# Patient Record
Sex: Female | Born: 1980 | Race: Black or African American | Hispanic: No | Marital: Single | Smoking: Current every day smoker
Health system: Southern US, Community
[De-identification: ages and names within clinical notes are randomized; demographics above are authoritative.]

## PROBLEM LIST (undated history)

## (undated) DIAGNOSIS — R768 Other specified abnormal immunological findings in serum: Secondary | ICD-10-CM

## (undated) DIAGNOSIS — M545 Low back pain, unspecified: Secondary | ICD-10-CM

## (undated) DIAGNOSIS — Z9119 Patient's noncompliance with other medical treatment and regimen: Secondary | ICD-10-CM

## (undated) DIAGNOSIS — F250 Schizoaffective disorder, bipolar type: Secondary | ICD-10-CM

## (undated) DIAGNOSIS — D649 Anemia, unspecified: Secondary | ICD-10-CM

## (undated) DIAGNOSIS — Z3490 Encounter for supervision of normal pregnancy, unspecified, unspecified trimester: Secondary | ICD-10-CM

## (undated) DIAGNOSIS — M3214 Glomerular disease in systemic lupus erythematosus: Principal | ICD-10-CM

## (undated) DIAGNOSIS — F319 Bipolar disorder, unspecified: Secondary | ICD-10-CM

## (undated) DIAGNOSIS — I309 Acute pericarditis, unspecified: Secondary | ICD-10-CM

## (undated) DIAGNOSIS — I10 Essential (primary) hypertension: Secondary | ICD-10-CM

## (undated) DIAGNOSIS — Z992 Dependence on renal dialysis: Secondary | ICD-10-CM

## (undated) DIAGNOSIS — F29 Unspecified psychosis not due to a substance or known physiological condition: Secondary | ICD-10-CM

## (undated) DIAGNOSIS — IMO0002 Reserved for concepts with insufficient information to code with codable children: Secondary | ICD-10-CM

## (undated) DIAGNOSIS — Z9289 Personal history of other medical treatment: Secondary | ICD-10-CM

## (undated) DIAGNOSIS — M329 Systemic lupus erythematosus, unspecified: Secondary | ICD-10-CM

## (undated) DIAGNOSIS — Z720 Tobacco use: Secondary | ICD-10-CM

## (undated) DIAGNOSIS — I509 Heart failure, unspecified: Secondary | ICD-10-CM

## (undated) DIAGNOSIS — Z7952 Long term (current) use of systemic steroids: Secondary | ICD-10-CM

## (undated) DIAGNOSIS — Z91199 Patient's noncompliance with other medical treatment and regimen due to unspecified reason: Secondary | ICD-10-CM

## (undated) DIAGNOSIS — N186 End stage renal disease: Secondary | ICD-10-CM

## (undated) DIAGNOSIS — F209 Schizophrenia, unspecified: Secondary | ICD-10-CM

## (undated) HISTORY — DX: Systemic lupus erythematosus, unspecified: M32.9

## (undated) HISTORY — DX: Reserved for concepts with insufficient information to code with codable children: IMO0002

## (undated) HISTORY — DX: Unspecified psychosis not due to a substance or known physiological condition: F29

## (undated) HISTORY — DX: Glomerular disease in systemic lupus erythematosus: M32.14

---
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
## 2001-06-12 ENCOUNTER — Emergency Department (HOSPITAL_COMMUNITY): Admission: EM | Admit: 2001-06-12 | Discharge: 2001-06-12 | Payer: Self-pay | Admitting: Emergency Medicine

## 2001-10-21 ENCOUNTER — Inpatient Hospital Stay (HOSPITAL_COMMUNITY): Admission: AD | Admit: 2001-10-21 | Discharge: 2001-10-21 | Payer: Self-pay | Admitting: *Deleted

## 2002-03-31 ENCOUNTER — Emergency Department (HOSPITAL_COMMUNITY): Admission: EM | Admit: 2002-03-31 | Discharge: 2002-03-31 | Payer: Self-pay | Admitting: Emergency Medicine

## 2002-04-17 ENCOUNTER — Emergency Department (HOSPITAL_COMMUNITY): Admission: EM | Admit: 2002-04-17 | Discharge: 2002-04-18 | Payer: Self-pay | Admitting: Emergency Medicine

## 2003-02-01 ENCOUNTER — Emergency Department (HOSPITAL_COMMUNITY): Admission: EM | Admit: 2003-02-01 | Discharge: 2003-02-01 | Payer: Self-pay | Admitting: Emergency Medicine

## 2003-02-12 ENCOUNTER — Emergency Department (HOSPITAL_COMMUNITY): Admission: EM | Admit: 2003-02-12 | Discharge: 2003-02-12 | Payer: Self-pay | Admitting: Emergency Medicine

## 2003-02-28 ENCOUNTER — Inpatient Hospital Stay (HOSPITAL_COMMUNITY): Admission: EM | Admit: 2003-02-28 | Discharge: 2003-03-05 | Payer: Self-pay

## 2003-04-08 HISTORY — PX: OTHER SURGICAL HISTORY: SHX169

## 2006-01-24 ENCOUNTER — Emergency Department (HOSPITAL_COMMUNITY): Admission: EM | Admit: 2006-01-24 | Discharge: 2006-01-24 | Payer: Self-pay | Admitting: Emergency Medicine

## 2006-05-13 ENCOUNTER — Emergency Department (HOSPITAL_COMMUNITY): Admission: EM | Admit: 2006-05-13 | Discharge: 2006-05-13 | Payer: Self-pay | Admitting: Emergency Medicine

## 2006-06-26 ENCOUNTER — Emergency Department (HOSPITAL_COMMUNITY): Admission: EM | Admit: 2006-06-26 | Discharge: 2006-06-27 | Payer: Self-pay | Admitting: Emergency Medicine

## 2006-09-09 ENCOUNTER — Emergency Department (HOSPITAL_COMMUNITY): Admission: EM | Admit: 2006-09-09 | Discharge: 2006-09-09 | Payer: Self-pay | Admitting: Emergency Medicine

## 2007-01-05 ENCOUNTER — Ambulatory Visit: Payer: Self-pay | Admitting: Family Medicine

## 2007-01-06 ENCOUNTER — Ambulatory Visit: Payer: Self-pay | Admitting: Obstetrics & Gynecology

## 2007-03-25 ENCOUNTER — Ambulatory Visit: Payer: Self-pay | Admitting: Obstetrics & Gynecology

## 2007-07-21 ENCOUNTER — Ambulatory Visit: Payer: Self-pay | Admitting: Gynecology

## 2007-08-01 ENCOUNTER — Emergency Department (HOSPITAL_COMMUNITY): Admission: EM | Admit: 2007-08-01 | Discharge: 2007-08-01 | Payer: Self-pay | Admitting: Emergency Medicine

## 2007-08-08 ENCOUNTER — Emergency Department (HOSPITAL_COMMUNITY): Admission: EM | Admit: 2007-08-08 | Discharge: 2007-08-08 | Payer: Self-pay | Admitting: Emergency Medicine

## 2007-12-28 ENCOUNTER — Emergency Department (HOSPITAL_COMMUNITY): Admission: EM | Admit: 2007-12-28 | Discharge: 2007-12-29 | Payer: Self-pay | Admitting: Emergency Medicine

## 2008-11-02 ENCOUNTER — Emergency Department (HOSPITAL_COMMUNITY): Admission: EM | Admit: 2008-11-02 | Discharge: 2008-11-02 | Payer: Self-pay | Admitting: Emergency Medicine

## 2009-01-02 ENCOUNTER — Emergency Department (HOSPITAL_COMMUNITY): Admission: EM | Admit: 2009-01-02 | Discharge: 2009-01-02 | Payer: Self-pay | Admitting: Emergency Medicine

## 2009-03-12 ENCOUNTER — Emergency Department (HOSPITAL_COMMUNITY): Admission: EM | Admit: 2009-03-12 | Discharge: 2009-03-12 | Payer: Self-pay | Admitting: Emergency Medicine

## 2009-03-26 ENCOUNTER — Emergency Department (HOSPITAL_COMMUNITY): Admission: EM | Admit: 2009-03-26 | Discharge: 2009-03-26 | Payer: Self-pay | Admitting: Emergency Medicine

## 2009-05-16 ENCOUNTER — Ambulatory Visit: Payer: Self-pay | Admitting: Obstetrics & Gynecology

## 2009-05-16 LAB — CONVERTED CEMR LAB
FSH: 127.2 milliintl units/mL — ABNORMAL HIGH
Prolactin: 8.6 ng/mL
TSH: 0.435 microintl units/mL (ref 0.350–4.500)

## 2009-07-09 ENCOUNTER — Emergency Department (HOSPITAL_COMMUNITY): Admission: EM | Admit: 2009-07-09 | Discharge: 2009-07-09 | Payer: Self-pay | Admitting: Emergency Medicine

## 2009-07-12 ENCOUNTER — Emergency Department (HOSPITAL_COMMUNITY): Admission: EM | Admit: 2009-07-12 | Discharge: 2009-07-12 | Payer: Self-pay | Admitting: Emergency Medicine

## 2009-08-20 ENCOUNTER — Emergency Department (HOSPITAL_COMMUNITY): Admission: EM | Admit: 2009-08-20 | Discharge: 2009-08-21 | Payer: Self-pay | Admitting: Emergency Medicine

## 2009-09-04 ENCOUNTER — Emergency Department (HOSPITAL_COMMUNITY): Admission: EM | Admit: 2009-09-04 | Discharge: 2009-09-04 | Payer: Self-pay | Admitting: Family Medicine

## 2009-09-12 ENCOUNTER — Ambulatory Visit: Payer: Self-pay | Admitting: Obstetrics and Gynecology

## 2009-10-18 ENCOUNTER — Emergency Department (HOSPITAL_COMMUNITY): Admission: EM | Admit: 2009-10-18 | Discharge: 2009-10-18 | Payer: Self-pay | Admitting: Emergency Medicine

## 2009-10-21 ENCOUNTER — Emergency Department (HOSPITAL_COMMUNITY): Admission: EM | Admit: 2009-10-21 | Discharge: 2009-10-21 | Payer: Self-pay | Admitting: Emergency Medicine

## 2009-11-20 ENCOUNTER — Emergency Department (HOSPITAL_COMMUNITY): Admission: EM | Admit: 2009-11-20 | Discharge: 2009-11-20 | Payer: Self-pay | Admitting: Emergency Medicine

## 2010-01-30 ENCOUNTER — Ambulatory Visit: Payer: Self-pay | Admitting: Obstetrics and Gynecology

## 2010-05-06 ENCOUNTER — Emergency Department (HOSPITAL_COMMUNITY)
Admission: EM | Admit: 2010-05-06 | Discharge: 2010-05-07 | Payer: Self-pay | Source: Home / Self Care | Admitting: Emergency Medicine

## 2010-05-08 ENCOUNTER — Emergency Department (HOSPITAL_COMMUNITY)
Admission: EM | Admit: 2010-05-08 | Discharge: 2010-05-08 | Disposition: A | Discharge status: Home / Self Care | Payer: Medicare Other | Attending: Emergency Medicine | Admitting: Emergency Medicine

## 2010-05-08 DIAGNOSIS — M542 Cervicalgia: Secondary | ICD-10-CM | POA: Insufficient documentation

## 2010-05-08 DIAGNOSIS — R51 Headache: Secondary | ICD-10-CM | POA: Insufficient documentation

## 2010-05-08 DIAGNOSIS — S139XXA Sprain of joints and ligaments of unspecified parts of neck, initial encounter: Secondary | ICD-10-CM | POA: Insufficient documentation

## 2010-05-08 DIAGNOSIS — R404 Transient alteration of awareness: Secondary | ICD-10-CM | POA: Insufficient documentation

## 2010-05-08 DIAGNOSIS — M549 Dorsalgia, unspecified: Secondary | ICD-10-CM | POA: Insufficient documentation

## 2010-06-12 ENCOUNTER — Ambulatory Visit: Payer: Medicare Other | Attending: Physical Medicine and Rehabilitation

## 2010-06-12 DIAGNOSIS — M542 Cervicalgia: Secondary | ICD-10-CM | POA: Insufficient documentation

## 2010-06-12 DIAGNOSIS — M545 Low back pain, unspecified: Secondary | ICD-10-CM | POA: Insufficient documentation

## 2010-06-12 DIAGNOSIS — R5381 Other malaise: Secondary | ICD-10-CM | POA: Insufficient documentation

## 2010-06-12 DIAGNOSIS — IMO0001 Reserved for inherently not codable concepts without codable children: Secondary | ICD-10-CM | POA: Insufficient documentation

## 2010-06-13 ENCOUNTER — Ambulatory Visit: Payer: Medicare Other

## 2010-06-19 ENCOUNTER — Ambulatory Visit: Payer: Medicare Other | Admitting: Physical Therapy

## 2010-06-21 ENCOUNTER — Ambulatory Visit: Payer: Medicare Other

## 2010-06-24 ENCOUNTER — Ambulatory Visit: Payer: Medicare Other | Admitting: Physical Therapy

## 2010-06-24 LAB — POCT URINALYSIS DIP (DEVICE)
Glucose, UA: NEGATIVE mg/dL
Hgb urine dipstick: NEGATIVE
Ketones, ur: NEGATIVE mg/dL
Nitrite: NEGATIVE
Protein, ur: 30 mg/dL — AB
Specific Gravity, Urine: >=1.03 (ref 1.005–1.030)
Urobilinogen, UA: 0.2 mg/dL (ref 0.0–1.0)
pH: 6 (ref 5.0–8.0)

## 2010-06-24 LAB — URINE CULTURE: Colony Count: >=100000

## 2010-06-24 LAB — DIFFERENTIAL
Basophils Absolute: 0 10*3/uL (ref 0.0–0.1)
Basophils Relative: 0 % (ref 0–1)
Eosinophils Absolute: 0 10*3/uL (ref 0.0–0.7)
Eosinophils Relative: 1 % (ref 0–5)
Lymphocytes Relative: 46 % (ref 12–46)
Lymphs Abs: 1.6 10*3/uL (ref 0.7–4.0)
Monocytes Absolute: 0.4 10*3/uL (ref 0.1–1.0)
Monocytes Relative: 13 % — ABNORMAL HIGH (ref 3–12)
Neutro Abs: 1.4 10*3/uL — ABNORMAL LOW (ref 1.7–7.7)
Neutrophils Relative %: 41 % — ABNORMAL LOW (ref 43–77)

## 2010-06-24 LAB — URINALYSIS, ROUTINE W REFLEX MICROSCOPIC
Bilirubin Urine: NEGATIVE
Glucose, UA: NEGATIVE mg/dL
Ketones, ur: 15 mg/dL — AB
Nitrite: POSITIVE — AB
Protein, ur: 30 mg/dL — AB
Specific Gravity, Urine: 1.031 — ABNORMAL HIGH (ref 1.005–1.030)
Urobilinogen, UA: 1 mg/dL (ref 0.0–1.0)
pH: 5.5 (ref 5.0–8.0)

## 2010-06-24 LAB — CBC
HCT: 36.6 % (ref 36.0–46.0)
Hemoglobin: 12.3 g/dL (ref 12.0–15.0)
MCHC: 33.6 g/dL (ref 30.0–36.0)
MCV: 88.9 fL (ref 78.0–100.0)
Platelets: 170 10*3/uL (ref 150–400)
RBC: 4.11 MIL/uL (ref 3.87–5.11)
RDW: 12.9 % (ref 11.5–15.5)
WBC: 3.5 10*3/uL — ABNORMAL LOW (ref 4.0–10.5)

## 2010-06-24 LAB — BASIC METABOLIC PANEL
BUN: 15 mg/dL (ref 6–23)
CO2: 27 mEq/L (ref 19–32)
Calcium: 9.8 mg/dL (ref 8.4–10.5)
Chloride: 106 mEq/L (ref 96–112)
Creatinine, Ser: 1.09 mg/dL (ref 0.4–1.2)
GFR calc Af Amer: >60 mL/min (ref >60)
GFR calc non Af Amer: 60 mL/min — ABNORMAL LOW (ref >60)
Glucose, Bld: 95 mg/dL (ref 70–99)
Potassium: 3.6 mEq/L (ref 3.5–5.1)
Sodium: 141 mEq/L (ref 135–145)

## 2010-06-24 LAB — HEPATIC FUNCTION PANEL
ALT: 26 U/L (ref 0–35)
AST: 25 U/L (ref 0–37)
Albumin: 4.2 g/dL (ref 3.5–5.2)
Alkaline Phosphatase: 68 U/L (ref 39–117)
Bilirubin, Direct: 0.1 mg/dL (ref 0.0–0.3)
Indirect Bilirubin: 0.6 mg/dL (ref 0.3–0.9)
Total Bilirubin: 0.7 mg/dL (ref 0.3–1.2)
Total Protein: 8.3 g/dL (ref 6.0–8.3)

## 2010-06-24 LAB — GC/CHLAMYDIA PROBE AMP, GENITAL
Chlamydia, DNA Probe: NEGATIVE
GC Probe Amp, Genital: NEGATIVE

## 2010-06-24 LAB — WET PREP, GENITAL
Trich, Wet Prep: NONE SEEN
WBC, Wet Prep HPF POC: NONE SEEN
Yeast Wet Prep HPF POC: NONE SEEN

## 2010-06-24 LAB — URINE MICROSCOPIC-ADD ON

## 2010-06-24 LAB — RPR: RPR Ser Ql: NONREACTIVE

## 2010-06-24 LAB — POCT PREGNANCY, URINE
Preg Test, Ur: NEGATIVE
Preg Test, Ur: NEGATIVE

## 2010-06-26 LAB — URINALYSIS, ROUTINE W REFLEX MICROSCOPIC
Bilirubin Urine: NEGATIVE
Glucose, UA: NEGATIVE mg/dL
Hgb urine dipstick: NEGATIVE
Ketones, ur: NEGATIVE mg/dL
Leukocytes, UA: NEGATIVE
Nitrite: NEGATIVE
Protein, ur: 30 mg/dL — AB
Specific Gravity, Urine: 1.03 (ref 1.005–1.030)
Urobilinogen, UA: 0.2 mg/dL (ref 0.0–1.0)
pH: 5 (ref 5.0–8.0)

## 2010-06-26 LAB — CBC
Platelets: 181 10*3/uL (ref 150–400)
RBC: 4.08 MIL/uL (ref 3.87–5.11)
WBC: 4 10*3/uL (ref 4.0–10.5)

## 2010-06-26 LAB — BASIC METABOLIC PANEL
BUN: 10 mg/dL (ref 6–23)
CO2: 28 mEq/L (ref 19–32)
Calcium: 9.4 mg/dL (ref 8.4–10.5)
Chloride: 108 mEq/L (ref 96–112)
Creatinine, Ser: 1 mg/dL (ref 0.4–1.2)
GFR calc Af Amer: >60 mL/min (ref >60)
GFR calc non Af Amer: >60 mL/min (ref >60)
Glucose, Bld: 82 mg/dL (ref 70–99)
Potassium: 3.8 mEq/L (ref 3.5–5.1)
Sodium: 141 mEq/L (ref 135–145)

## 2010-06-26 LAB — DIFFERENTIAL
Lymphocytes Relative: 38 % (ref 12–46)
Lymphs Abs: 1.5 10*3/uL (ref 0.7–4.0)
Neutrophils Relative %: 49 % (ref 43–77)

## 2010-06-26 LAB — RPR: RPR Ser Ql: NONREACTIVE

## 2010-06-26 LAB — URINE MICROSCOPIC-ADD ON

## 2010-06-26 LAB — GC/CHLAMYDIA PROBE AMP, GENITAL
Chlamydia, DNA Probe: NEGATIVE
GC Probe Amp, Genital: NEGATIVE

## 2010-06-26 LAB — POCT PREGNANCY, URINE: Preg Test, Ur: NEGATIVE

## 2010-06-26 LAB — WET PREP, GENITAL: Yeast Wet Prep HPF POC: NONE SEEN

## 2010-07-08 LAB — URINALYSIS, ROUTINE W REFLEX MICROSCOPIC
Glucose, UA: NEGATIVE mg/dL
Ketones, ur: NEGATIVE mg/dL
Nitrite: NEGATIVE
Protein, ur: NEGATIVE mg/dL

## 2010-07-08 LAB — WET PREP, GENITAL
Trich, Wet Prep: NONE SEEN
WBC, Wet Prep HPF POC: NONE SEEN
Yeast Wet Prep HPF POC: NONE SEEN

## 2010-07-12 LAB — URINE MICROSCOPIC-ADD ON

## 2010-07-12 LAB — POCT I-STAT, CHEM 8
Calcium, Ion: 1.22 mmol/L (ref 1.12–1.32)
Creatinine, Ser: 1.1 mg/dL (ref 0.4–1.2)
Glucose, Bld: 75 mg/dL (ref 70–99)
Hemoglobin: 13.3 g/dL (ref 12.0–15.0)
Sodium: 141 mEq/L (ref 135–145)
TCO2: 29 mmol/L (ref 0–100)

## 2010-07-12 LAB — URINALYSIS, ROUTINE W REFLEX MICROSCOPIC
Glucose, UA: NEGATIVE mg/dL
Hgb urine dipstick: NEGATIVE
Protein, ur: 30 mg/dL — AB

## 2010-08-20 NOTE — Group Therapy Note (Signed)
NAME:  Sara [name], Sara [name] NO.:  [id number]   MEDICAL RECORD NO.:  [id number]          PATIENT TYPE:  WOC   LOCATION:  [facility]                   FACILITY:  [facility]   PHYSICIAN:  Ginger Carne, MD DATE OF BIRTH:  [birth year]   DATE OF SERVICE:  07/21/2007                                  CLINIC NOTE   The patient returns today because of followup related to lack of menses  over the past 2 years.  She has been on Risperdal for approximately 3-  1/2 to 4 years.  Laboratory work revealed a normal TSH.  Her FSH was  120, and her LH was not performed.  Her urine pregnancy test was  negative in September 2008, and her prolactin level was 26.2.  The  patient had an Aygestin withdrawal bleed in early January following  medication prescribed on March 25, 2007, for a 10-day trial of  Aygestin 10 mg.  She complains of menopausal symptoms compatible with  menopause.  I think at this point it would be appropriate to consider  placing her on a q. 64-month course of Aygestin 10 mg daily for 10 days  every 3 months at the beginning of each month.  She declines the use of  oral contraceptives because she says she forgets to take it and does not  want to utilize Depo-Provera because of not being reassured that she  will have monthly menses.  At this point, I am concerned about her  amenorrhea, which possibly could be secondary to premature menopause.  I  would suspect the Risperdal would cause a lowered FSH, but certainly can  be responsible for galactorrhea.  I asked her to sit tight because she  is going to see her counselor about having her Risperdal dose lowered or  taken off.  I asked her to return afterwards to see about further  testing, including chromosome analyses.  After she has seen her  counselor and has had medication adjusted, I asked her to return 1 or 2  months after medication adjustment to see if she gets a menses on her  own.     ______________________________  Ginger Carne, MD     SHB/MEDQ  D:  07/21/2007  T:  07/21/2007  Job:  [id number]

## 2010-08-20 NOTE — Group Therapy Note (Signed)
NAME:  Sara [name], GAU NO.:  [id number]   MEDICAL RECORD NO.:  [id number]          PATIENT TYPE:  WOC   LOCATION:  [facility]                   FACILITY:  [facility]   PHYSICIAN:  Elsie Lincoln, MD      DATE OF BIRTH:  09-18-1980   DATE OF SERVICE:  01/06/2007                                  CLINIC NOTE   The patient is a 30 year old nulliparous female who was sent to me by  the health department for 6 months of amenorrhea.  Her major medical  problem is psychosis.  She is on risperidone.  She is nulliparous.  She  is complaining of hot flashes and headaches.   PAST MEDICAL HISTORY:  Psychosis.   PAST SURGICAL HISTORY:  None.   PAST GYN HISTORY:  Herpes.   MEDICATIONS:  Risperdal IM every 2 weeks.   OBSTETRICAL HISTORY:  Nulliparous.   PHYSICAL EXAM:  Positive galactorrhea.  ABDOMEN:  Positive stria, but the patient states these stretch marks  have been there for many years.  This started when she started  Risperdal.  Genitalia is slightly atrophic.  Cervix nulliparous years, nontender.  Adnexa no masses, nontender.   ASSESSMENT/PLAN:  A 30 year old female with galactorrhea.  1. Prolactin level pending.  2. Most likely secondary to Risperdal.  3. Provera withdrawal bleeding.  4. Most likely which is start on OCPs to protect her endometrium.  5. Come back in 3 weeks.           ______________________________  Elsie Lincoln, MD     KL/MEDQ  D:  01/06/2007  T:  01/07/2007  Job:  [id number]
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
## 2010-10-21 ENCOUNTER — Emergency Department (HOSPITAL_COMMUNITY)
Admission: EM | Admit: 2010-10-21 | Discharge: 2010-10-21 | Disposition: A | Discharge status: Home / Self Care | Payer: Medicare Other | Attending: Emergency Medicine | Admitting: Emergency Medicine

## 2010-12-31 LAB — COMPREHENSIVE METABOLIC PANEL
ALT: 19
AST: 27
Albumin: 3.9
CO2: 26
Chloride: 105
GFR calc Af Amer: >60
GFR calc non Af Amer: >60
Sodium: 138
Total Bilirubin: 0.7

## 2010-12-31 LAB — ETHANOL: Alcohol, Ethyl (B): <5

## 2010-12-31 LAB — DIFFERENTIAL
Basophils Absolute: 0
Eosinophils Absolute: 0
Eosinophils Relative: 0
Lymphs Abs: 1.3
Monocytes Absolute: 0.6

## 2010-12-31 LAB — CBC
RBC: 3.89
WBC: 7.1

## 2010-12-31 LAB — RAPID URINE DRUG SCREEN, HOSP PERFORMED
Amphetamines: NOT DETECTED
Tetrahydrocannabinol: POSITIVE — AB

## 2011-01-06 LAB — URINALYSIS, ROUTINE W REFLEX MICROSCOPIC
Bilirubin Urine: NEGATIVE
Hgb urine dipstick: NEGATIVE
Ketones, ur: NEGATIVE
Nitrite: NEGATIVE
pH: 6

## 2011-01-06 LAB — POCT PREGNANCY, URINE: Preg Test, Ur: NEGATIVE

## 2011-01-06 LAB — POCT I-STAT, CHEM 8
BUN: 10
Chloride: 105
HCT: 41
Potassium: 3.7

## 2011-01-06 LAB — URINE MICROSCOPIC-ADD ON

## 2011-01-08 DIAGNOSIS — A6000 Herpesviral infection of urogenital system, unspecified: Secondary | ICD-10-CM | POA: Insufficient documentation

## 2011-01-08 DIAGNOSIS — N912 Amenorrhea, unspecified: Secondary | ICD-10-CM | POA: Insufficient documentation

## 2011-01-08 DIAGNOSIS — N643 Galactorrhea not associated with childbirth: Secondary | ICD-10-CM | POA: Insufficient documentation

## 2011-01-09 ENCOUNTER — Other Ambulatory Visit (HOSPITAL_COMMUNITY)
Admission: RE | Admit: 2011-01-09 | Discharge: 2011-01-09 | Disposition: A | Discharge status: Home / Self Care | Payer: Medicare HMO | Source: Ambulatory Visit | Attending: Obstetrics and Gynecology | Admitting: Obstetrics and Gynecology

## 2011-01-09 ENCOUNTER — Encounter: Payer: Self-pay | Admitting: Obstetrics and Gynecology

## 2011-01-09 ENCOUNTER — Ambulatory Visit (INDEPENDENT_AMBULATORY_CARE_PROVIDER_SITE_OTHER): Payer: Medicare Other | Admitting: Obstetrics and Gynecology

## 2011-01-09 DIAGNOSIS — N909 Noninflammatory disorder of vulva and perineum, unspecified: Secondary | ICD-10-CM

## 2011-01-09 DIAGNOSIS — Z124 Encounter for screening for malignant neoplasm of cervix: Secondary | ICD-10-CM | POA: Insufficient documentation

## 2011-01-09 DIAGNOSIS — N898 Other specified noninflammatory disorders of vagina: Secondary | ICD-10-CM

## 2011-01-09 DIAGNOSIS — Z01419 Encounter for gynecological examination (general) (routine) without abnormal findings: Secondary | ICD-10-CM

## 2011-01-09 DIAGNOSIS — N899 Noninflammatory disorder of vagina, unspecified: Secondary | ICD-10-CM

## 2011-01-09 MED ORDER — MEDROXYPROGESTERONE ACETATE 10 MG PO TABS: 10.0000 mg | ORAL_TABLET | Freq: Every day | ORAL | Status: DC

## 2011-01-09 NOTE — Progress Notes (Signed)
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 In the past she had FSH levels as high as 127. Been amenorrheic but surprisingly enough withdraws with Provera. She would like another prescription for Provera I told her as long as she is having withdrawal bleeding is fine to continue. Once the bleeding stops she could should consider estrogen supplementation.  Examination external genitalia normal, BUS within normal limits vagina clean well rugated cervix clean nulliparous Pap smear was taken. Uterus anterior normal size shape consistency adnexa normal.  Patient was complaining of some irritation a she recently changed soaps would like to be checked for infection in any case therefore we will do a wet prep.

## 2011-01-09 NOTE — Progress Notes (Signed)
Needs refill of hormone pill

## 2011-01-10 LAB — WET PREP, GENITAL: Yeast Wet Prep HPF POC: NONE SEEN

## 2011-01-14 ENCOUNTER — Telehealth: Payer: Self-pay | Admitting: *Deleted

## 2011-01-14 MED ORDER — METRONIDAZOLE 500 MG PO TABS: 500.0000 mg | ORAL_TABLET | Freq: Two times a day (BID) | ORAL | Status: AC

## 2011-01-14 NOTE — Telephone Encounter (Signed)
Pt left message requesting test results from visit on 10/4.  I called pt after reviewing her wet prep and Pap results. I informed her that her Pap was WNL. Her wet prep showed trichomoniasis infection which we can treat with medication. She will need to have her partner get tested and treated accordingly before resuming sexual intercourse with him. Pt voiced understanding. Rx sent to pharmacy of choice.

## 2011-01-18 ENCOUNTER — Emergency Department (HOSPITAL_COMMUNITY)
Admission: EM | Admit: 2011-01-18 | Discharge: 2011-01-19 | Disposition: A | Discharge status: Home / Self Care | Payer: Medicare HMO | Attending: Emergency Medicine | Admitting: Emergency Medicine

## 2011-01-18 DIAGNOSIS — I498 Other specified cardiac arrhythmias: Secondary | ICD-10-CM | POA: Insufficient documentation

## 2011-01-18 DIAGNOSIS — Z046 Encounter for general psychiatric examination, requested by authority: Secondary | ICD-10-CM | POA: Insufficient documentation

## 2011-01-18 LAB — COMPREHENSIVE METABOLIC PANEL
AST: 23 U/L (ref 0–37)
Albumin: 2.7 g/dL — ABNORMAL LOW (ref 3.5–5.2)
Alkaline Phosphatase: 85 U/L (ref 39–117)
Chloride: 103 mEq/L (ref 96–112)
Creatinine, Ser: 0.87 mg/dL (ref 0.50–1.10)
Potassium: 3.5 mEq/L (ref 3.5–5.1)
Sodium: 138 mEq/L (ref 135–145)
Total Bilirubin: 0.4 mg/dL (ref 0.3–1.2)

## 2011-01-18 LAB — DIFFERENTIAL
Basophils Absolute: 0 10*3/uL (ref 0.0–0.1)
Basophils Relative: 0 % (ref 0–1)
Eosinophils Absolute: 0 10*3/uL (ref 0.0–0.7)
Eosinophils Relative: 0 % (ref 0–5)

## 2011-01-18 LAB — CBC
Platelets: 245 10*3/uL (ref 150–400)
RDW: 13.5 % (ref 11.5–15.5)
WBC: 5.7 10*3/uL (ref 4.0–10.5)

## 2011-01-19 LAB — RAPID URINE DRUG SCREEN, HOSP PERFORMED
Amphetamines: NOT DETECTED
Barbiturates: NOT DETECTED
Tetrahydrocannabinol: POSITIVE — AB

## 2011-01-19 LAB — URINE MICROSCOPIC-ADD ON

## 2011-01-19 LAB — URINALYSIS, ROUTINE W REFLEX MICROSCOPIC
Bilirubin Urine: NEGATIVE
Nitrite: NEGATIVE
Specific Gravity, Urine: 1.045 — ABNORMAL HIGH (ref 1.005–1.030)
pH: 6 (ref 5.0–8.0)

## 2011-01-19 LAB — POCT PREGNANCY, URINE: Preg Test, Ur: NEGATIVE

## 2011-01-20 ENCOUNTER — Emergency Department (HOSPITAL_COMMUNITY)
Admission: EM | Admit: 2011-01-20 | Discharge: 2011-01-20 | Disposition: A | Discharge status: Home / Self Care | Payer: Medicare HMO | Attending: Emergency Medicine | Admitting: Emergency Medicine

## 2011-01-20 DIAGNOSIS — Z91199 Patient's noncompliance with other medical treatment and regimen due to unspecified reason: Secondary | ICD-10-CM | POA: Insufficient documentation

## 2011-01-20 DIAGNOSIS — F319 Bipolar disorder, unspecified: Secondary | ICD-10-CM | POA: Insufficient documentation

## 2011-01-20 DIAGNOSIS — Z9119 Patient's noncompliance with other medical treatment and regimen: Secondary | ICD-10-CM | POA: Insufficient documentation

## 2011-01-20 DIAGNOSIS — IMO0002 Reserved for concepts with insufficient information to code with codable children: Secondary | ICD-10-CM | POA: Insufficient documentation

## 2011-01-20 LAB — RAPID URINE DRUG SCREEN, HOSP PERFORMED
Amphetamines: NOT DETECTED
Barbiturates: NOT DETECTED
Cocaine: NOT DETECTED
Tetrahydrocannabinol: POSITIVE — AB

## 2011-01-20 LAB — DIFFERENTIAL
Basophils Absolute: 0 10*3/uL (ref 0.0–0.1)
Basophils Relative: 0 % (ref 0–1)
Eosinophils Absolute: 0 10*3/uL (ref 0.0–0.7)
Neutrophils Relative %: 60 % (ref 43–77)

## 2011-01-20 LAB — COMPREHENSIVE METABOLIC PANEL
ALT: 14 U/L (ref 0–35)
AST: 24 U/L (ref 0–37)
Albumin: 2.8 g/dL — ABNORMAL LOW (ref 3.5–5.2)
CO2: 26 mEq/L (ref 19–32)
Chloride: 103 mEq/L (ref 96–112)
GFR calc non Af Amer: 86 mL/min — ABNORMAL LOW (ref >90)
Sodium: 140 mEq/L (ref 135–145)
Total Bilirubin: 0.4 mg/dL (ref 0.3–1.2)

## 2011-01-20 LAB — POCT PREGNANCY, URINE: Preg Test, Ur: NEGATIVE

## 2011-01-20 LAB — CBC
Platelets: 242 10*3/uL (ref 150–400)
RBC: 4.17 MIL/uL (ref 3.87–5.11)
WBC: 6.5 10*3/uL (ref 4.0–10.5)

## 2011-01-23 LAB — RAPID URINE DRUG SCREEN, HOSP PERFORMED
Amphetamines: NOT DETECTED
Cocaine: NOT DETECTED
Opiates: NOT DETECTED
Tetrahydrocannabinol: NOT DETECTED

## 2011-01-23 LAB — BASIC METABOLIC PANEL
BUN: 8
Calcium: 9.7
Creatinine, Ser: 1.15
GFR calc Af Amer: >60

## 2011-01-23 LAB — DIFFERENTIAL
Basophils Relative: 0
Lymphs Abs: 1.7
Monocytes Relative: 13 — ABNORMAL HIGH
Neutro Abs: 1.4 — ABNORMAL LOW
Neutrophils Relative %: 39 — ABNORMAL LOW

## 2011-01-23 LAB — URINALYSIS, ROUTINE W REFLEX MICROSCOPIC
Hgb urine dipstick: NEGATIVE
Protein, ur: 30 — AB
Urobilinogen, UA: 1

## 2011-01-23 LAB — CBC
Platelets: 206
RBC: 4.24
WBC: 3.6 — ABNORMAL LOW

## 2011-01-23 LAB — URINE MICROSCOPIC-ADD ON

## 2011-01-23 LAB — B-NATRIURETIC PEPTIDE (CONVERTED LAB): Pro B Natriuretic peptide (BNP): <30

## 2011-01-23 LAB — ETHANOL: Alcohol, Ethyl (B): <5

## 2011-06-06 ENCOUNTER — Ambulatory Visit (INDEPENDENT_AMBULATORY_CARE_PROVIDER_SITE_OTHER): Payer: Medicare Other | Admitting: Obstetrics & Gynecology

## 2011-06-06 MED ORDER — MEDROXYPROGESTERONE ACETATE 10 MG PO TABS: 10.0000 mg | ORAL_TABLET | Freq: Every day | ORAL | Status: DC

## 2011-06-06 NOTE — Progress Notes (Signed)
  Subjective:    Patient ID: Sara [name], female    DOB: [birth year], 31 y.o.   MRN: [id number]  HPI  Sara [name] left after she discovered that she didn't need a pap smear today.  Review of Systems     Objective:   Physical Exam        Assessment & Plan:

## 2011-06-25 ENCOUNTER — Other Ambulatory Visit: Payer: Medicare HMO

## 2012-05-12 ENCOUNTER — Emergency Department (HOSPITAL_COMMUNITY)
Admission: EM | Admit: 2012-05-12 | Discharge: 2012-05-13 | Disposition: A | Discharge status: Other Acute Inpatient Hospital | Payer: Medicare HMO | Attending: Emergency Medicine | Admitting: Emergency Medicine

## 2012-05-12 ENCOUNTER — Encounter (HOSPITAL_COMMUNITY): Payer: Self-pay | Admitting: Emergency Medicine

## 2012-05-12 DIAGNOSIS — D649 Anemia, unspecified: Secondary | ICD-10-CM | POA: Insufficient documentation

## 2012-05-12 DIAGNOSIS — R4689 Other symptoms and signs involving appearance and behavior: Secondary | ICD-10-CM

## 2012-05-12 DIAGNOSIS — F603 Borderline personality disorder: Secondary | ICD-10-CM | POA: Insufficient documentation

## 2012-05-12 DIAGNOSIS — F1210 Cannabis abuse, uncomplicated: Secondary | ICD-10-CM | POA: Insufficient documentation

## 2012-05-12 DIAGNOSIS — Z3202 Encounter for pregnancy test, result negative: Secondary | ICD-10-CM | POA: Insufficient documentation

## 2012-05-12 DIAGNOSIS — F17200 Nicotine dependence, unspecified, uncomplicated: Secondary | ICD-10-CM | POA: Insufficient documentation

## 2012-05-12 DIAGNOSIS — Z8659 Personal history of other mental and behavioral disorders: Secondary | ICD-10-CM | POA: Insufficient documentation

## 2012-05-12 DIAGNOSIS — Z79899 Other long term (current) drug therapy: Secondary | ICD-10-CM | POA: Insufficient documentation

## 2012-05-12 LAB — BASIC METABOLIC PANEL
BUN: 13 mg/dL (ref 6–23)
CO2: 22 mEq/L (ref 19–32)
Calcium: 8.8 mg/dL (ref 8.4–10.5)
Chloride: 105 mEq/L (ref 96–112)
Creatinine, Ser: 1.32 mg/dL — ABNORMAL HIGH (ref 0.50–1.10)
GFR calc Af Amer: 62 mL/min — ABNORMAL LOW (ref >90)
GFR calc non Af Amer: 53 mL/min — ABNORMAL LOW (ref >90)
Glucose, Bld: 89 mg/dL (ref 70–99)
Potassium: 3.2 mEq/L — ABNORMAL LOW (ref 3.5–5.1)
Sodium: 137 mEq/L (ref 135–145)

## 2012-05-12 LAB — URINE MICROSCOPIC-ADD ON

## 2012-05-12 LAB — RAPID URINE DRUG SCREEN, HOSP PERFORMED
Amphetamines: NOT DETECTED
Barbiturates: NOT DETECTED
Benzodiazepines: NOT DETECTED
Cocaine: NOT DETECTED
Opiates: NOT DETECTED
Tetrahydrocannabinol: POSITIVE — AB

## 2012-05-12 LAB — CBC
HCT: 25.1 % — ABNORMAL LOW (ref 36.0–46.0)
Hemoglobin: 8.4 g/dL — ABNORMAL LOW (ref 12.0–15.0)
MCH: 28 pg (ref 26.0–34.0)
MCHC: 33.5 g/dL (ref 30.0–36.0)
MCV: 83.7 fL (ref 78.0–100.0)
Platelets: 225 10*3/uL (ref 150–400)
RBC: 3 MIL/uL — ABNORMAL LOW (ref 3.87–5.11)
RDW: 13.8 % (ref 11.5–15.5)
WBC: 7.3 10*3/uL (ref 4.0–10.5)

## 2012-05-12 LAB — URINALYSIS, ROUTINE W REFLEX MICROSCOPIC
Glucose, UA: NEGATIVE mg/dL
Leukocytes, UA: NEGATIVE
Nitrite: NEGATIVE
Protein, ur: >300 mg/dL — AB
Specific Gravity, Urine: 1.046 — ABNORMAL HIGH (ref 1.005–1.030)
Urobilinogen, UA: 1 mg/dL (ref 0.0–1.0)
pH: 6 (ref 5.0–8.0)

## 2012-05-12 LAB — PREGNANCY, URINE: Preg Test, Ur: NEGATIVE

## 2012-05-12 MED ORDER — ZIPRASIDONE MESYLATE 20 MG IM SOLR
20.0000 mg | Freq: Once | INTRAMUSCULAR | Status: AC
  Administered 2012-05-12: 20 mg via INTRAMUSCULAR
  Filled 2012-05-12: qty 20

## 2012-05-12 MED ORDER — LORAZEPAM 2 MG/ML IJ SOLN
1.0000 mg | Freq: Once | INTRAMUSCULAR | Status: AC
  Administered 2012-05-12: 1 mg via INTRAMUSCULAR
  Filled 2012-05-12: qty 1

## 2012-05-12 NOTE — ED Notes (Signed)
[id number]<IO> Expected date:<BR> Expected time:<BR> Means of arrival:<BR> Comments:<BR> EMS from [facility]-agitated and hypertensive
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
## 2012-05-12 NOTE — ED Notes (Signed)
Pt arrived to ED room accompanied by [facility] officers, hands cuffed.
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
## 2012-05-12 NOTE — ED Notes (Signed)
Brought in by EMS from [facility] with c/o hypertension and "very agitated behavior"--- pt was observed constantly moving and talking, pt has flight of ideas and incomprehensible; arrived to ED room very agitated and talking loudly, pt cooperative.

## 2012-05-13 LAB — URINE CULTURE
Colony Count: NO GROWTH
Culture: NO GROWTH

## 2012-05-13 MED ORDER — ACETAMINOPHEN 325 MG PO TABS: 650.0000 mg | ORAL_TABLET | ORAL | Status: DC | PRN

## 2012-05-13 MED ORDER — LACTATED RINGERS IV BOLUS (SEPSIS): 1000.0000 mL | Freq: Once | INTRAVENOUS | Status: DC

## 2012-05-13 MED ORDER — BENZTROPINE MESYLATE 1 MG PO TABS
1.0000 mg | ORAL_TABLET | Freq: Every day | ORAL | Status: DC
  Administered 2012-05-13: 1 mg via ORAL
  Filled 2012-05-13: qty 1

## 2012-05-13 MED ORDER — IBUPROFEN 600 MG PO TABS
600.0000 mg | ORAL_TABLET | Freq: Three times a day (TID) | ORAL | Status: DC | PRN
  Administered 2012-05-13: 600 mg via ORAL
  Filled 2012-05-13: qty 1

## 2012-05-13 MED ORDER — ZIPRASIDONE HCL 20 MG PO CAPS
20.0000 mg | ORAL_CAPSULE | Freq: Two times a day (BID) | ORAL | Status: DC
  Administered 2012-05-13: 20 mg via ORAL
  Filled 2012-05-13: qty 1

## 2012-05-13 MED ORDER — POTASSIUM CHLORIDE CRYS ER 20 MEQ PO TBCR
40.0000 meq | EXTENDED_RELEASE_TABLET | Freq: Once | ORAL | Status: AC
  Administered 2012-05-13: 40 meq via ORAL
  Filled 2012-05-13: qty 2

## 2012-05-13 MED ORDER — MEDROXYPROGESTERONE ACETATE 10 MG PO TABS: 10.0000 mg | ORAL_TABLET | Freq: Every day | ORAL | Status: DC

## 2012-05-13 MED ORDER — BENZTROPINE MESYLATE 1 MG/ML IJ SOLN: 1.0000 mg | Freq: Every day | INTRAMUSCULAR | Status: DC

## 2012-05-13 MED ORDER — ONDANSETRON HCL 4 MG PO TABS: 4.0000 mg | ORAL_TABLET | Freq: Three times a day (TID) | ORAL | Status: DC | PRN

## 2012-05-13 MED ORDER — ZIPRASIDONE MESYLATE 20 MG IM SOLR: 20.0000 mg | Freq: Once | INTRAMUSCULAR | Status: DC

## 2012-05-13 MED ORDER — ALUM & MAG HYDROXIDE-SIMETH 200-200-20 MG/5ML PO SUSP: 30.0000 mL | ORAL | Status: DC | PRN

## 2012-05-13 MED ORDER — ZOLPIDEM TARTRATE 5 MG PO TABS: 5.0000 mg | ORAL_TABLET | Freq: Every evening | ORAL | Status: DC | PRN

## 2012-05-13 MED ORDER — RISPERIDONE 2 MG PO TABS
2.0000 mg | ORAL_TABLET | Freq: Every day | ORAL | Status: DC
  Administered 2012-05-13: 2 mg via ORAL
  Filled 2012-05-13: qty 1

## 2012-05-13 MED ORDER — LORAZEPAM 1 MG PO TABS: 1.0000 mg | ORAL_TABLET | Freq: Three times a day (TID) | ORAL | Status: DC | PRN

## 2012-05-13 NOTE — ED Provider Notes (Addendum)
Assuming care of patient this morning. Patient in the ED for agitation, sent by [facility]. She is IVC. Awaiting psych evaluation. Workup thus far is normal, and vitals are stable and WNL. Patient had no complains, no concerns from the nursing side. Will continue to monitor.  Derwood Kaplan, MD 05/13/12 3086  Derwood Kaplan, MD 05/13/12 [phone]

## 2012-05-13 NOTE — ED Notes (Signed)
Pt asked writer if she could go to the bathroom, Clinical research associate assisted pt with no problems, pt is cooperative, and understanding of what is needed to be done. Pt gait is steady. Writer provided a [place] sandwich and ginger ale for pt.

## 2012-05-13 NOTE — ED Notes (Signed)
Report received 

## 2012-05-13 NOTE — Progress Notes (Addendum)
Reviewed Chart, labs, Vitals and notes.  1.Recommend Medications be verified at [facility] ASAP.  Please note when last Consta injection was done. 2. Recommend using PO Risperdal or IM Risperdal for psychosis 2mg . 3. Discontinue Geodon. 4. Add Cogentin 1mg  for EPS please.  I spoke with Dr. Soledad Gerlach and he agreed with this plan.  Psych ED RN notified at 10:15am Lloyd Huger T. Darrio Bade [facility] 05/13/2012 10:17 AM

## 2012-05-13 NOTE — ED Notes (Signed)
Pt became more agitated, started yelling inappropriate words and moving violently while in bed.

## 2012-05-13 NOTE — ED Notes (Signed)
Encouraging po fluids

## 2012-05-13 NOTE — ED Notes (Signed)
Transportation called ° °

## 2012-05-13 NOTE — ED Notes (Signed)
Patient easily agitated-needs redirection at times-speech pressured/slurred, hard to understand-guarded-MD notified of patients potential for escalation-order for po geodon

## 2012-05-13 NOTE — ED Notes (Signed)
Report called to Windy Canny, RN at [facility]
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
## 2012-06-04 ENCOUNTER — Emergency Department (HOSPITAL_COMMUNITY): Payer: Medicare HMO

## 2012-06-04 ENCOUNTER — Encounter (HOSPITAL_COMMUNITY): Payer: Self-pay | Admitting: *Deleted

## 2012-06-04 ENCOUNTER — Emergency Department (HOSPITAL_COMMUNITY)
Admission: EM | Admit: 2012-06-04 | Discharge: 2012-06-04 | Disposition: A | Discharge status: Home / Self Care | Payer: Medicare HMO | Attending: Emergency Medicine | Admitting: Emergency Medicine

## 2012-06-04 DIAGNOSIS — F29 Unspecified psychosis not due to a substance or known physiological condition: Secondary | ICD-10-CM | POA: Insufficient documentation

## 2012-06-04 DIAGNOSIS — E8809 Other disorders of plasma-protein metabolism, not elsewhere classified: Secondary | ICD-10-CM | POA: Insufficient documentation

## 2012-06-04 DIAGNOSIS — F17200 Nicotine dependence, unspecified, uncomplicated: Secondary | ICD-10-CM | POA: Insufficient documentation

## 2012-06-04 DIAGNOSIS — R809 Proteinuria, unspecified: Secondary | ICD-10-CM | POA: Insufficient documentation

## 2012-06-04 DIAGNOSIS — D649 Anemia, unspecified: Secondary | ICD-10-CM | POA: Insufficient documentation

## 2012-06-04 DIAGNOSIS — Z79899 Other long term (current) drug therapy: Secondary | ICD-10-CM | POA: Insufficient documentation

## 2012-06-04 DIAGNOSIS — Z3202 Encounter for pregnancy test, result negative: Secondary | ICD-10-CM | POA: Insufficient documentation

## 2012-06-04 DIAGNOSIS — N390 Urinary tract infection, site not specified: Secondary | ICD-10-CM | POA: Insufficient documentation

## 2012-06-04 LAB — COMPREHENSIVE METABOLIC PANEL
ALT: 16 U/L (ref 0–35)
AST: 16 U/L (ref 0–37)
Albumin: 1.9 g/dL — ABNORMAL LOW (ref 3.5–5.2)
Alkaline Phosphatase: 73 U/L (ref 39–117)
Calcium: 8.9 mg/dL (ref 8.4–10.5)
Glucose, Bld: 97 mg/dL (ref 70–99)
Potassium: 4.1 mEq/L (ref 3.5–5.1)
Sodium: 142 mEq/L (ref 135–145)
Total Protein: 5.8 g/dL — ABNORMAL LOW (ref 6.0–8.3)

## 2012-06-04 LAB — URINALYSIS, ROUTINE W REFLEX MICROSCOPIC
Nitrite: NEGATIVE
Protein, ur: >300 mg/dL — AB
Specific Gravity, Urine: 1.018 (ref 1.005–1.030)
Urobilinogen, UA: 0.2 mg/dL (ref 0.0–1.0)

## 2012-06-04 LAB — CBC
HCT: 23.3 % — ABNORMAL LOW (ref 36.0–46.0)
Hemoglobin: 7.6 g/dL — ABNORMAL LOW (ref 12.0–15.0)
RDW: 14.8 % (ref 11.5–15.5)
WBC: 5 10*3/uL (ref 4.0–10.5)

## 2012-06-04 LAB — URINE MICROSCOPIC-ADD ON

## 2012-06-04 MED ORDER — SULFAMETHOXAZOLE-TRIMETHOPRIM 800-160 MG PO TABS: 1.0000 | ORAL_TABLET | Freq: Two times a day (BID) | ORAL | Status: DC

## 2012-06-04 MED ORDER — T.E.D. BELOW KNEE/L X-LGTH MISC: 2.0000 [IU] | Freq: Every day | Status: DC

## 2012-06-04 MED ORDER — FUROSEMIDE 10 MG/ML IJ SOLN
20.0000 mg | Freq: Once | INTRAMUSCULAR | Status: AC
  Administered 2012-06-04: 20 mg via INTRAVENOUS
  Filled 2012-06-04: qty 2

## 2012-06-04 MED ORDER — FUROSEMIDE 20 MG PO TABS: ORAL_TABLET | ORAL | Status: DC
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
## 2012-06-04 NOTE — ED Notes (Signed)
Results of POCT pregnancy:   Negative 

## 2012-06-04 NOTE — ED Notes (Signed)
Pt reports ongoing swelling to bilateral lower legs and face. Pt states that she has been placed on "2 different fluid pills" with no relief. Pt states that she believes she is allergic to some of her medications. NAD noted.

## 2012-06-09 NOTE — ED Provider Notes (Signed)
Medical screening examination/treatment/procedure(s) were performed by non-physician practitioner and as supervising physician I was immediately available for consultation/collaboration.  Gilda Crease, MD 06/09/12 [phone]

## 2012-06-20 ENCOUNTER — Emergency Department (HOSPITAL_COMMUNITY)
Admission: EM | Admit: 2012-06-20 | Discharge: 2012-06-22 | Disposition: A | Discharge status: Home / Self Care | Payer: Medicare HMO | Attending: Emergency Medicine | Admitting: Emergency Medicine

## 2012-06-20 ENCOUNTER — Encounter (HOSPITAL_COMMUNITY): Payer: Self-pay | Admitting: Emergency Medicine

## 2012-06-20 DIAGNOSIS — I10 Essential (primary) hypertension: Secondary | ICD-10-CM | POA: Insufficient documentation

## 2012-06-20 DIAGNOSIS — IMO0002 Reserved for concepts with insufficient information to code with codable children: Secondary | ICD-10-CM | POA: Insufficient documentation

## 2012-06-20 DIAGNOSIS — F1210 Cannabis abuse, uncomplicated: Secondary | ICD-10-CM | POA: Insufficient documentation

## 2012-06-20 DIAGNOSIS — Z79899 Other long term (current) drug therapy: Secondary | ICD-10-CM | POA: Insufficient documentation

## 2012-06-20 DIAGNOSIS — F17200 Nicotine dependence, unspecified, uncomplicated: Secondary | ICD-10-CM | POA: Insufficient documentation

## 2012-06-20 DIAGNOSIS — Z3202 Encounter for pregnancy test, result negative: Secondary | ICD-10-CM | POA: Insufficient documentation

## 2012-06-20 DIAGNOSIS — F603 Borderline personality disorder: Secondary | ICD-10-CM | POA: Insufficient documentation

## 2012-06-20 DIAGNOSIS — R4789 Other speech disturbances: Secondary | ICD-10-CM | POA: Insufficient documentation

## 2012-06-20 DIAGNOSIS — F29 Unspecified psychosis not due to a substance or known physiological condition: Secondary | ICD-10-CM | POA: Insufficient documentation

## 2012-06-20 DIAGNOSIS — F259 Schizoaffective disorder, unspecified: Secondary | ICD-10-CM | POA: Insufficient documentation

## 2012-06-20 DIAGNOSIS — F1410 Cocaine abuse, uncomplicated: Secondary | ICD-10-CM | POA: Insufficient documentation

## 2012-06-20 DIAGNOSIS — F1010 Alcohol abuse, uncomplicated: Secondary | ICD-10-CM | POA: Insufficient documentation

## 2012-06-20 DIAGNOSIS — M7989 Other specified soft tissue disorders: Secondary | ICD-10-CM | POA: Insufficient documentation

## 2012-06-20 DIAGNOSIS — F250 Schizoaffective disorder, bipolar type: Secondary | ICD-10-CM

## 2012-06-20 HISTORY — DX: Essential (primary) hypertension: I10

## 2012-06-20 LAB — CBC
Hemoglobin: 8.8 g/dL — ABNORMAL LOW (ref 12.0–15.0)
MCH: 27.4 pg (ref 26.0–34.0)
MCV: 87.9 fL (ref 78.0–100.0)
Platelets: 232 10*3/uL (ref 150–400)
RBC: 3.21 MIL/uL — ABNORMAL LOW (ref 3.87–5.11)
WBC: 5.6 10*3/uL (ref 4.0–10.5)

## 2012-06-20 LAB — COMPREHENSIVE METABOLIC PANEL
ALT: 9 U/L (ref 0–35)
AST: 15 U/L (ref 0–37)
CO2: 23 mEq/L (ref 19–32)
Chloride: 108 mEq/L (ref 96–112)
GFR calc Af Amer: 46 mL/min — ABNORMAL LOW (ref >90)
GFR calc non Af Amer: 39 mL/min — ABNORMAL LOW (ref >90)
Glucose, Bld: 96 mg/dL (ref 70–99)
Sodium: 142 mEq/L (ref 135–145)
Total Bilirubin: 0.2 mg/dL — ABNORMAL LOW (ref 0.3–1.2)

## 2012-06-20 LAB — POCT PREGNANCY, URINE: Preg Test, Ur: NEGATIVE

## 2012-06-20 LAB — RAPID URINE DRUG SCREEN, HOSP PERFORMED
Amphetamines: NOT DETECTED
Barbiturates: NOT DETECTED
Tetrahydrocannabinol: POSITIVE — AB

## 2012-06-20 MED ORDER — OLANZAPINE 5 MG PO TABS
20.0000 mg | ORAL_TABLET | Freq: Two times a day (BID) | ORAL | Status: DC
  Administered 2012-06-20 – 2012-06-22 (×4): 20 mg via ORAL
  Filled 2012-06-20 (×4): qty 4

## 2012-06-20 MED ORDER — BENZTROPINE MESYLATE 1 MG PO TABS
1.0000 mg | ORAL_TABLET | Freq: Two times a day (BID) | ORAL | Status: DC
  Administered 2012-06-20 – 2012-06-22 (×4): 1 mg via ORAL
  Filled 2012-06-20 (×4): qty 1

## 2012-06-20 MED ORDER — LORAZEPAM 1 MG PO TABS: 1.0000 mg | ORAL_TABLET | Freq: Three times a day (TID) | ORAL | Status: DC | PRN

## 2012-06-20 MED ORDER — NICOTINE 21 MG/24HR TD PT24
21.0000 mg | MEDICATED_PATCH | Freq: Every day | TRANSDERMAL | Status: DC
  Filled 2012-06-20: qty 1

## 2012-06-20 MED ORDER — ONDANSETRON HCL 4 MG PO TABS: 4.0000 mg | ORAL_TABLET | Freq: Three times a day (TID) | ORAL | Status: DC | PRN

## 2012-06-20 MED ORDER — IBUPROFEN 600 MG PO TABS
600.0000 mg | ORAL_TABLET | Freq: Three times a day (TID) | ORAL | Status: DC | PRN
  Administered 2012-06-22: 600 mg via ORAL
  Filled 2012-06-20 (×2): qty 1

## 2012-06-20 MED ORDER — ACETAMINOPHEN 325 MG PO TABS: 650.0000 mg | ORAL_TABLET | ORAL | Status: DC | PRN

## 2012-06-20 MED ORDER — ZIPRASIDONE MESYLATE 20 MG IM SOLR
20.0000 mg | Freq: Once | INTRAMUSCULAR | Status: AC
  Administered 2012-06-20: 20 mg via INTRAMUSCULAR
  Filled 2012-06-20: qty 20

## 2012-06-20 MED ORDER — FUROSEMIDE 20 MG PO TABS: 20.0000 mg | ORAL_TABLET | Freq: Every day | ORAL | Status: DC

## 2012-06-20 MED ORDER — OXCARBAZEPINE 300 MG PO TABS
300.0000 mg | ORAL_TABLET | Freq: Three times a day (TID) | ORAL | Status: DC
  Administered 2012-06-20 – 2012-06-22 (×6): 300 mg via ORAL
  Filled 2012-06-20 (×8): qty 1
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
## 2012-06-21 ENCOUNTER — Telehealth (HOSPITAL_COMMUNITY): Payer: Self-pay | Admitting: Behavioral Health

## 2012-06-21 MED ORDER — ZIPRASIDONE MESYLATE 20 MG IM SOLR
10.0000 mg | Freq: Once | INTRAMUSCULAR | Status: AC
  Administered 2012-06-21: 10 mg via INTRAMUSCULAR
  Filled 2012-06-21 (×2): qty 20

## 2012-06-21 NOTE — ED Notes (Signed)
Patient is resting comfortably. 
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
## 2012-06-21 NOTE — ED Notes (Signed)
Acting out yelling at staff, calling this nurse a bitch for asking her to calm down, and get off phone, walking around hallways yelling obscenities and cursing, informed her she would be receiving injection ifshecouldnot calm down.

## 2012-06-21 NOTE — ED Notes (Signed)
2 bags of pt belongings placed in locker 37

## 2012-06-21 NOTE — ED Provider Notes (Signed)
Pt stable awaiting placement  Chenita Ruda L Amirrah Quigley, MD 06/21/12 0719 
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
## 2012-06-21 NOTE — Treatment Plan (Signed)
Since there are no 400 hall beds at [facility] and already two pt's accepted pending 400 hall beds, ACT should continue to seek placement elsewhere for Sara [name].  If outside placement can not be secured, pt will be considered at [facility] once a bed is available.

## 2012-06-21 NOTE — BHH Counselor (Signed)
ACT Counselor contact [facility] and spoke to [place].  ACT counselor was instructed to send another assessment notification form and include the ACT counselor name that completed the Assessment on 06-21-2012.

## 2012-06-21 NOTE — ED Notes (Signed)
Came up to NS asking for 1600 med, informed her she was asleep and it was this nurses opinion she needed the sleep from earlier geodon injection, stated "we will be in the front office in the morning" for not giving meds as you should be doing.became  Very agitated, asked to go back toher room until she calmed down, calmed down and asked tousephone, escalated on phone, nomore phone usage today d/t behaviors; after talkingon thephone around 1730 went back to room and bed. Sleeping.

## 2012-06-21 NOTE — ED Notes (Signed)
Pt belligerent and aggressive. She is non compliant with staff. She has a h/o Bipolar. Pt brought by [employer] for verbal threats.  Per [place], RN / Malva Cogan Hydrographic surveyor)

## 2012-06-21 NOTE — ED Notes (Signed)
Talking toMD

## 2012-06-22 NOTE — BHH Counselor (Signed)
Patient discharged and escorted out by nurse prior to ACT being able to meet with patient and providing outpatient referrals.

## 2012-06-22 NOTE — ED Notes (Signed)
Pt belongings returned to pt including items that were locked in safe by security

## 2012-06-22 NOTE — BHH Counselor (Signed)
3/18  Pt referred to [facility], [place], [place], [place]. Pending review.   3/18 No beds at [facility], [facility], [facility], [address], [place], [place], [place], [place].  3/17 Pt referred to [facility]; declined due to previous acuity. [facility] was recommended.

## 2012-06-22 NOTE — BHH Suicide Risk Assessment (Signed)
Suicide Risk Assessment  Discharge Assessment     Demographic Factors:  Adolescent or young adult, Low socioeconomic status and Unemployed  Mental Status Per Nursing Assessment::   On Admission:     Current Mental Status by Physician: NA  Loss Factors: Financial problems/change in socioeconomic status  Historical Factors: Impulsivity and NA  Risk Reduction Factors:   Sense of responsibility to family, Religious beliefs about death, Living with another person, especially a relative, Positive social support and Positive therapeutic relationship  Continued Clinical Symptoms:  Bipolar Disorder:   Mixed State Alcohol/Substance Abuse/Dependencies  Cognitive Features That Contribute To Risk:  Closed-mindedness Polarized thinking    Suicide Risk:  Minimal: No identifiable suicidal ideation.  Patients presenting with no risk factors but with morbid ruminations; may be classified as minimal risk based on the severity of the depressive symptoms  Discharge Diagnoses:   AXIS I:  Bipolar, mixed AXIS II:  Deferred AXIS III:   Past Medical History  Diagnosis Date  . Psychosis   . Hypertension    AXIS IV:  economic problems, other psychosocial or environmental problems, problems related to social environment and problems with access to health care services AXIS V:  41-50 serious symptoms  Plan Of Care/Follow-up recommendations:  Activity:  as tolerated Diet:  regular  Is patient on multiple antipsychotic therapies at discharge:  No   Has Patient had three or more failed trials of antipsychotic monotherapy by history:  No  Recommended Plan for Multiple Antipsychotic Therapies: Not applicable  Sara [name],JANARDHAHA R. 06/22/2012, 4:26 PM
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
## 2012-06-24 ENCOUNTER — Encounter: Payer: Medicare HMO | Admitting: Obstetrics & Gynecology

## 2012-07-02 NOTE — ED Provider Notes (Signed)
Medical screening examination/treatment/procedure(s) were performed by non-physician practitioner and as supervising physician I was immediately available for consultation/collaboration.  Toy Baker, MD 07/02/12 [phone]

## 2012-07-16 ENCOUNTER — Ambulatory Visit (INDEPENDENT_AMBULATORY_CARE_PROVIDER_SITE_OTHER): Payer: Medicare Other | Admitting: Obstetrics and Gynecology

## 2012-07-16 VITALS — BP 154/103 | HR 80 | Temp 98.5°F | Ht 68.0 in | Wt 243.0 lb

## 2012-07-16 DIAGNOSIS — N760 Acute vaginitis: Secondary | ICD-10-CM | POA: Insufficient documentation

## 2012-07-16 LAB — WET PREP, GENITAL
Trich, Wet Prep: NONE SEEN
WBC, Wet Prep HPF POC: NONE SEEN
Yeast Wet Prep HPF POC: NONE SEEN

## 2012-07-16 NOTE — Progress Notes (Signed)
Patient ID: Sara [name], female   DOB: [birth year], 32 y.o.   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 Patient was discharged and diagnosed with a UTI and proteinuria. Patient presents today with a complaint of bilateral pedal edema. She denies vaginal bleeding or pelvic pain. She reports her last pap smear was in December and she had a cervical procedure done a that time while incarcerated. She is not sure of the nature of the procedure but she states that the results were normal. She complains of a non pruritic, non malodorous vaginal discharge.  GENERAL: Well-developed, well-nourished female in no acute distress.  ABDOMEN: Soft, nontender, nondistended. No organomegaly. PELVIC: Normal external female genitalia. Vagina is pink and rugated.  Normal discharge. Normal appearing cervix. Uterus is normal in size. No adnexal mass or tenderness. EXTREMITIES: No cyanosis, clubbing, + 2  Bilateral pedal edema, 2+ distal pulses.  A/P 32 yo here for vaginitis and ED follow up in pedal edema - Wet prep collected - Patient will be contacted with any abnormal results - Patient will be referred to [facility] for further evaluation of pedal edema and further medical care

## 2012-07-19 ENCOUNTER — Telehealth: Payer: Self-pay

## 2012-07-19 MED ORDER — METRONIDAZOLE 500 MG PO TABS: 500.0000 mg | ORAL_TABLET | Freq: Two times a day (BID) | ORAL | Status: DC

## 2012-07-19 NOTE — Telephone Encounter (Signed)
Called pt and informed pt of BV and that an antibiotic has been sent to her pharmacy.  Pt stated understanding and did not have any further questions.

## 2012-07-19 NOTE — Telephone Encounter (Signed)
Message copied by Faythe Casa on Mon Jul 19, 2012  4:18 PM ------      Message from: CONSTANT, PEGGY      Created: Mon Jul 19, 2012  3:13 PM       Please inform patient of positive BV. Rx e-prescribed            Peggy ------

## 2012-07-19 NOTE — Addendum Note (Signed)
Addended by: Catalina Antigua on: 07/19/2012 03:14 PM   Modules accepted: Orders

## 2012-07-30 ENCOUNTER — Ambulatory Visit (INDEPENDENT_AMBULATORY_CARE_PROVIDER_SITE_OTHER): Payer: Medicaid Other | Admitting: Family Medicine

## 2012-07-30 VITALS — BP 152/100 | HR 84 | Ht 67.0 in | Wt 233.0 lb

## 2012-07-30 DIAGNOSIS — R809 Proteinuria, unspecified: Secondary | ICD-10-CM

## 2012-07-30 DIAGNOSIS — I10 Essential (primary) hypertension: Secondary | ICD-10-CM

## 2012-07-30 LAB — COMPREHENSIVE METABOLIC PANEL
Alkaline Phosphatase: 61 U/L (ref 39–117)
Creat: 4.37 mg/dL — ABNORMAL HIGH (ref 0.50–1.10)
Glucose, Bld: 75 mg/dL (ref 70–99)
Sodium: 139 mEq/L (ref 135–145)
Total Bilirubin: 0.3 mg/dL (ref 0.3–1.2)
Total Protein: 6.2 g/dL (ref 6.0–8.3)

## 2012-07-30 MED ORDER — LISINOPRIL 20 MG PO TABS: 20.0000 mg | ORAL_TABLET | Freq: Every day | ORAL | Status: DC

## 2012-07-30 NOTE — Patient Instructions (Signed)
Nice to meet you today. We will get some labs today to check on your kidney function. We will also ask that you collect some urine for 24 hours and return it to [place]. I sent in a prescription for lisinopril which is for your blood pressure and to help your kidneys.

## 2012-08-02 ENCOUNTER — Telehealth: Payer: Self-pay | Admitting: Family Medicine

## 2012-08-02 ENCOUNTER — Encounter: Payer: Self-pay | Admitting: Family Medicine

## 2012-08-02 NOTE — Assessment & Plan Note (Signed)
Patient with recently elevated BP. Never been on medication. Given changes in Cr and proteinuria, suspect this may be related to her kidney. Plan: will start on lisinopril 20 mg daily for HTN and renal protection

## 2012-08-02 NOTE — Telephone Encounter (Signed)
Patient's lab report reviewed with Dr Birdie Sons. Creatinine level jumped up from 1.6 one month ago to 4. Per last note she was asymptomatic. I suggested to Dr Birdie Sons to check in with her to ensure she is not developing any major renal related symptom. I reviewed her med list,she is on the following med with potential for renal impairment; Bactrim DS: Uncertain when last she took this, Dr Birdie Sons will confirm.  Lasix: To confirm if she is currently on this. Ibuprofen. She was recently started on Lisinopril for her BP at last visit. Damage to her Kidney might also be from her uncontrolled BP.  Assessment: Cockcroft-Gault GFR ;33.18mL/min                       Creatinine Clearance Cockcroft-Gault:23.5 mL/min  PMD advised to;                          Hold off on Lasix for now,advised avoidance of NSAID and Bactrim DS.                          Encourage good hydration.                          Recheck kidney function this week.                          Schedule renal referral as soon as possible for assessment and management.                          If no improvement in Kidney function might need kidney replacement therapy/dialysis.

## 2012-08-02 NOTE — Assessment & Plan Note (Signed)
Patient with recently noted proteinuria >300, elevation in Cr to 1.69, hypoalbuminemia, and LE edema likely indicating nephrotic syndrome. Unknown cause at this time. Was previously on lithium and this is known to cause renal dysfunction. No longer on lasix and infrequently uses NSAIDs. Denies any rash and joint pains that could indicate lupus. Father on dialysis, potentially heritable renal dysfunction. Plan: will obtain CMET to determine current renal function, 24 hour urine protein to evaluate for nephrotic syndrome. Will see in f/u in one week to review labs and clinical decision making based on results.

## 2012-08-02 NOTE — Telephone Encounter (Signed)
Patient seen as new patient on 07/30/12 and noted to have proteinuria, hypoalbuminemia, elevated creatinine, and LE edema all concerning for renal insult. CMET was obtained that revealed Cr 4.37 from 1.69 one month ago. Albumin was stable. Electrolytes were stable as well.  Spoke to patient on the phone to inform of these lab results. Confirmed that she is not taking lasix as the ED only gave her 5 doses. Is infrequently taking ibuprofen with only a couple of doses in the past several weeks. Is not currently on bactrim. She denied any complaints other than continued edema. No current nausea, vomiting, or loss of appetite, though noted a couple of weeks ago was having some nausea and decreased appetite and this resolved last week.  Advised patient to continue with her 24 hour urine collection. She has an appointment this Friday for f/u of these issues. Will need to obtain f/u Cr at that time. Patient needs referral for nephrologist, though at this time we are not the medical practice on the patients medicaid card so can not put referral in until we are on her card. Spoke with Terese Door, who stated it could be as early as May 1st that card gets changed, but more likely will be June 1st as form was submitted after the 15th of the month. Advised to return to care if she develops nausea, vomiting, loss of appetite, or mental status changes.

## 2012-08-02 NOTE — Progress Notes (Signed)
  Subjective:    Patient ID: Sara [name], female    DOB: Aug 05, 1980, 32 y.o.   MRN: [id number]  Hypertension Pertinent negatives include no chest pain, headaches or shortness of breath.   Patient is a 32 yo female who presents for f/u of LE edema and HTN. Additionally noted to have proteinuria, hypoalbuminemia, and elevated Cr on labs from a month ago.  Renal: patient noted to have Cr 1.69 one month ago. 1.22 a month prior to that. Also with proteinuria >300. Noted that feet began swelling in February 2014. Seen in ED and given lasix, with 5 doses for home. Didn't help much and has not taken any more. Occasionally takes ibuprofen or advil, though states has not taken more than a couple doses in past several weeks. Has been wrapping legs. Was on lithium for 2 years and recently was taken off of this in February. Denies any rash or joint aches.  HTN: has recently been noted to be high. Is not on any medications. Denies chest pain, shortness of breath, and HA.  FHx: father on dialysis, unsure of reason for this.  Review of Systems  Constitutional: Negative for fever and unexpected weight change.  HENT: Negative for hearing loss, trouble swallowing and tinnitus.   Eyes: Negative for visual disturbance.  Respiratory: Negative for cough and shortness of breath.   Cardiovascular: Positive for leg swelling. Negative for chest pain.  Gastrointestinal: Negative for nausea, vomiting, abdominal pain and diarrhea.  Endocrine: Positive for polydipsia.  Genitourinary: Positive for vaginal discharge (protein in urine). Negative for dysuria, frequency and difficulty urinating.  Skin: Negative for rash.  Neurological: Negative for dizziness, weakness, light-headedness and headaches.  Psychiatric/Behavioral: The patient is not nervous/anxious.       Objective:   Physical Exam  Constitutional: She appears well-developed and well-nourished.  HENT:  Head: Normocephalic and atraumatic.  Eyes:  Conjunctivae are normal.  Neck: Normal range of motion.  Cardiovascular: Normal rate, regular rhythm and normal heart sounds.  Exam reveals no gallop and no friction rub.   No murmur heard. Pulmonary/Chest: Effort normal and breath sounds normal. She has no wheezes. She has no rales.  Abdominal: Soft. Bowel sounds are normal. She exhibits no distension and no mass. There is no tenderness. There is no rebound and no guarding.  Musculoskeletal: She exhibits edema (bilateral LE 1+ pitting edema).  Neurological: She is alert.  Skin: Skin is warm and dry. No rash noted.  BP 152/100  Pulse 84  Ht 5\' 7"  (1.702 m)  Wt 233 lb (105.688 kg)  BMI 36.48 kg/m2    Assessment & Plan:

## 2012-08-06 ENCOUNTER — Ambulatory Visit: Payer: Medicare HMO | Admitting: Family Medicine

## 2012-08-08 ENCOUNTER — Inpatient Hospital Stay (HOSPITAL_COMMUNITY)
Admission: EM | Admit: 2012-08-08 | Discharge: 2012-08-23 | DRG: 546 | Disposition: A | Discharge status: Home / Self Care | Payer: Medicare HMO | Attending: Family Medicine | Admitting: Family Medicine

## 2012-08-08 ENCOUNTER — Encounter (HOSPITAL_COMMUNITY): Payer: Self-pay | Admitting: *Deleted

## 2012-08-08 DIAGNOSIS — M329 Systemic lupus erythematosus, unspecified: Principal | ICD-10-CM | POA: Diagnosis present

## 2012-08-08 DIAGNOSIS — F29 Unspecified psychosis not due to a substance or known physiological condition: Secondary | ICD-10-CM | POA: Diagnosis present

## 2012-08-08 DIAGNOSIS — Z9119 Patient's noncompliance with other medical treatment and regimen: Secondary | ICD-10-CM

## 2012-08-08 DIAGNOSIS — M3214 Glomerular disease in systemic lupus erythematosus: Secondary | ICD-10-CM | POA: Diagnosis present

## 2012-08-08 DIAGNOSIS — F309 Manic episode, unspecified: Secondary | ICD-10-CM

## 2012-08-08 DIAGNOSIS — F3110 Bipolar disorder, current episode manic without psychotic features, unspecified: Secondary | ICD-10-CM

## 2012-08-08 DIAGNOSIS — R809 Proteinuria, unspecified: Secondary | ICD-10-CM

## 2012-08-08 DIAGNOSIS — A6000 Herpesviral infection of urogenital system, unspecified: Secondary | ICD-10-CM

## 2012-08-08 DIAGNOSIS — N179 Acute kidney failure, unspecified: Secondary | ICD-10-CM | POA: Diagnosis present

## 2012-08-08 DIAGNOSIS — N912 Amenorrhea, unspecified: Secondary | ICD-10-CM

## 2012-08-08 DIAGNOSIS — F17200 Nicotine dependence, unspecified, uncomplicated: Secondary | ICD-10-CM | POA: Diagnosis present

## 2012-08-08 DIAGNOSIS — R7989 Other specified abnormal findings of blood chemistry: Secondary | ICD-10-CM

## 2012-08-08 DIAGNOSIS — N760 Acute vaginitis: Secondary | ICD-10-CM

## 2012-08-08 DIAGNOSIS — Z9114 Patient's other noncompliance with medication regimen: Secondary | ICD-10-CM

## 2012-08-08 DIAGNOSIS — R768 Other specified abnormal immunological findings in serum: Secondary | ICD-10-CM | POA: Diagnosis present

## 2012-08-08 DIAGNOSIS — Z79899 Other long term (current) drug therapy: Secondary | ICD-10-CM

## 2012-08-08 DIAGNOSIS — I10 Essential (primary) hypertension: Secondary | ICD-10-CM | POA: Diagnosis present

## 2012-08-08 DIAGNOSIS — N058 Unspecified nephritic syndrome with other morphologic changes: Secondary | ICD-10-CM | POA: Diagnosis present

## 2012-08-08 DIAGNOSIS — D638 Anemia in other chronic diseases classified elsewhere: Secondary | ICD-10-CM | POA: Diagnosis present

## 2012-08-08 DIAGNOSIS — N2581 Secondary hyperparathyroidism of renal origin: Secondary | ICD-10-CM | POA: Diagnosis present

## 2012-08-08 LAB — CBC WITH DIFFERENTIAL/PLATELET
Eosinophils Absolute: 0 10*3/uL (ref 0.0–0.7)
Eosinophils Relative: 0 % (ref 0–5)
HCT: 22.4 % — ABNORMAL LOW (ref 36.0–46.0)
Hemoglobin: 7.4 g/dL — ABNORMAL LOW (ref 12.0–15.0)
Lymphocytes Relative: 40 % (ref 12–46)
Lymphs Abs: 2.2 10*3/uL (ref 0.7–4.0)
MCH: 27.5 pg (ref 26.0–34.0)
MCV: 83.3 fL (ref 78.0–100.0)
Monocytes Absolute: 0.3 10*3/uL (ref 0.1–1.0)
Monocytes Relative: 6 % (ref 3–12)
Platelets: 233 10*3/uL (ref 150–400)
RBC: 2.69 MIL/uL — ABNORMAL LOW (ref 3.87–5.11)

## 2012-08-08 LAB — BASIC METABOLIC PANEL
BUN: 37 mg/dL — ABNORMAL HIGH (ref 6–23)
CO2: 21 mEq/L (ref 19–32)
Calcium: 8.9 mg/dL (ref 8.4–10.5)
Creatinine, Ser: 6.92 mg/dL — ABNORMAL HIGH (ref 0.50–1.10)
GFR calc non Af Amer: 7 mL/min — ABNORMAL LOW (ref >90)
Glucose, Bld: 90 mg/dL (ref 70–99)

## 2012-08-08 LAB — ETHANOL: Alcohol, Ethyl (B): <11 mg/dL (ref 0–11)

## 2012-08-08 MED ORDER — ALUM & MAG HYDROXIDE-SIMETH 200-200-20 MG/5ML PO SUSP
30.0000 mL | ORAL | Status: DC | PRN
  Filled 2012-08-08: qty 30

## 2012-08-08 MED ORDER — ZIPRASIDONE MESYLATE 20 MG IM SOLR
20.0000 mg | Freq: Once | INTRAMUSCULAR | Status: AC
  Administered 2012-08-08: 20 mg via INTRAMUSCULAR

## 2012-08-08 MED ORDER — ACETAMINOPHEN 325 MG PO TABS: 650.0000 mg | ORAL_TABLET | ORAL | Status: DC | PRN

## 2012-08-08 MED ORDER — SODIUM CHLORIDE 0.9 % IV SOLN
1000.0000 mL | Freq: Once | INTRAVENOUS | Status: AC
  Administered 2012-08-08: 1000 mL via INTRAVENOUS

## 2012-08-08 MED ORDER — SODIUM CHLORIDE 0.9 % IV SOLN: 1000.0000 mL | INTRAVENOUS | Status: DC

## 2012-08-08 MED ORDER — NICOTINE 21 MG/24HR TD PT24
21.0000 mg | MEDICATED_PATCH | Freq: Every day | TRANSDERMAL | Status: DC
  Filled 2012-08-08 (×15): qty 1

## 2012-08-08 MED ORDER — ZIPRASIDONE MESYLATE 20 MG IM SOLR
INTRAMUSCULAR | Status: AC
  Filled 2012-08-08: qty 20

## 2012-08-08 MED ORDER — LORAZEPAM 1 MG PO TABS: 1.0000 mg | ORAL_TABLET | Freq: Three times a day (TID) | ORAL | Status: DC | PRN

## 2012-08-08 MED ORDER — ONDANSETRON HCL 4 MG PO TABS: 4.0000 mg | ORAL_TABLET | Freq: Three times a day (TID) | ORAL | Status: DC | PRN

## 2012-08-08 MED ORDER — IBUPROFEN 600 MG PO TABS
600.0000 mg | ORAL_TABLET | Freq: Three times a day (TID) | ORAL | Status: DC | PRN
  Filled 2012-08-08: qty 1

## 2012-08-08 MED ORDER — LORAZEPAM 2 MG/ML IJ SOLN
2.0000 mg | Freq: Once | INTRAMUSCULAR | Status: AC
  Administered 2012-08-08: 2 mg via INTRAMUSCULAR

## 2012-08-08 MED ORDER — LORAZEPAM 2 MG/ML IJ SOLN
INTRAMUSCULAR | Status: AC
  Filled 2012-08-08: qty 1

## 2012-08-08 NOTE — ED Notes (Addendum)
Pt sleeping soundly, resp even and unlabored. Pt was able to transfer to the bed w/o difficulty per security. Side rails up x2

## 2012-08-08 NOTE — ED Provider Notes (Signed)
Medical screening examination/treatment/procedure(s) were conducted as a shared visit with non-physician practitioner(s) and myself.  I personally evaluated the patient during the encounter  Please see my other note for complete details  Lyanne Co, MD 08/08/12 2306

## 2012-08-08 NOTE — ED Notes (Signed)
Dr Patria Mane aware of lab results-transfer pt back to ED for further eval.  Charge nurse aware nad will  Call w/ bed number.

## 2012-08-08 NOTE — ED Notes (Signed)
[id number]<AV> Expected date:<BR> Expected time:<BR> Means of arrival:<BR> Comments:<BR> For psych transfer - Bed 40

## 2012-08-08 NOTE — ED Provider Notes (Signed)
Medical screening examination/treatment/procedure(s) were performed by non-physician practitioner and as supervising physician I was immediately available for consultation/collaboration.  Quandarius Nill L Demonte Dobratz, MD 08/08/12 1640 

## 2012-08-08 NOTE — ED Notes (Signed)
Spoke with Tammy at [facility] to arrange transportation to [facility]. Informed by Tammy that she would return call to obtain patient information.
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
## 2012-08-08 NOTE — ED Provider Notes (Signed)
The patient was in the psychiatric emergency department awaiting placement for psychiatric disposition given her presentation what appears to be psychosis.  I noted that her BUN and creatinine were significantly more elevated than they have before in the past.  Is concerning for acute renal failure that is worsening in a young 32 year old.  Her BUN today is 37 and her creatinine is 6.92.  Her potassium is 3.5.  One week ago her BUN was 19 and her creatinine was 4.37.  One month ago her BUN was 15 and her creatinine was 1.69.  The patient will be admitted to the family practice service who is currently addressing this as an outpatient however this will need to be addressed as an inpatient before the patient is medically cleared for psychiatric placement. Urinalysis pending at this time.  Results for orders placed during the hospital encounter of 08/08/12  CBC WITH DIFFERENTIAL      Result Value Range   WBC 5.4  4.0 - 10.5 K/uL   RBC 2.69 (*) 3.87 - 5.11 MIL/uL   Hemoglobin 7.4 (*) 12.0 - 15.0 g/dL   HCT 16.1 (*) 09.6 - 04.5 %   MCV 83.3  78.0 - 100.0 fL   MCH 27.5  26.0 - 34.0 pg   MCHC 33.0  30.0 - 36.0 g/dL   RDW 40.9  81.1 - 91.4 %   Platelets 233  150 - 400 K/uL   Neutrophils Relative 54  43 - 77 %   Neutro Abs 2.9  1.7 - 7.7 K/uL   Lymphocytes Relative 40  12 - 46 %   Lymphs Abs 2.2  0.7 - 4.0 K/uL   Monocytes Relative 6  3 - 12 %   Monocytes Absolute 0.3  0.1 - 1.0 K/uL   Eosinophils Relative 0  0 - 5 %   Eosinophils Absolute 0.0  0.0 - 0.7 K/uL   Basophils Relative 0  0 - 1 %   Basophils Absolute 0.0  0.0 - 0.1 K/uL  BASIC METABOLIC PANEL      Result Value Range   Sodium 143  135 - 145 mEq/L   Potassium 3.5  3.5 - 5.1 mEq/L   Chloride 109  96 - 112 mEq/L   CO2 21  19 - 32 mEq/L   Glucose, Bld 90  70 - 99 mg/dL   BUN 37 (*) 6 - 23 mg/dL   Creatinine, Ser 7.82 (*) 0.50 - 1.10 mg/dL   Calcium 8.9  8.4 - 95.6 mg/dL   GFR calc non Af Amer 7 (*) >90 mL/min   GFR calc Af Amer 8 (*)  >90 mL/min  ACETAMINOPHEN LEVEL      Result Value Range   Acetaminophen (Tylenol), Serum <15.0  10 - 30 ug/mL  SALICYLATE LEVEL      Result Value Range   Salicylate Lvl <2.0 (*) 2.8 - 20.0 mg/dL  ETHANOL      Result Value Range   Alcohol, Ethyl (B) <11  0 - 11 mg/dL     Lyanne Co, MD 08/08/12 2131
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
## 2012-08-08 NOTE — ED Notes (Signed)
Patient given a cordless phone to call her mother. Patient tearful after talking to mother.

## 2012-08-08 NOTE — ED Notes (Signed)
Patient changed into blue scrubs with help of [employer]. Security in to wand patient and search one pt belonging bag.

## 2012-08-08 NOTE — ED Notes (Signed)
Patient to ED Acute Bed 15 from Psych ED. Patient ambulated to the bathroom; gait steady. Patient states that she had a bowel movement and did not urinate. Patient states that she did not receive a phone call today and that she would like to call her mother.

## 2012-08-08 NOTE — ED Notes (Signed)
Attempt to call report to [phone] unsuccessful; receiving RN will return call to ED when available for report.
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
## 2012-08-08 NOTE — ED Notes (Signed)
Explained to patient that she was being transported to [facility]. Patient states that she did not want to go and that "I want to go home." States that she has an appointment with a kidney doctor of Wednesday and "I don't want to mess that up." Patient refused to sign transfer paperwork. Patient currently under IVC. Transfer paperwork signed by 2 RNs.

## 2012-08-09 ENCOUNTER — Inpatient Hospital Stay (HOSPITAL_COMMUNITY): Payer: Medicare HMO

## 2012-08-09 DIAGNOSIS — N179 Acute kidney failure, unspecified: Secondary | ICD-10-CM

## 2012-08-09 DIAGNOSIS — F29 Unspecified psychosis not due to a substance or known physiological condition: Secondary | ICD-10-CM

## 2012-08-09 LAB — CBC
HCT: 23.3 % — ABNORMAL LOW (ref 36.0–46.0)
Hemoglobin: 7.5 g/dL — ABNORMAL LOW (ref 12.0–15.0)
MCV: 82.9 fL (ref 78.0–100.0)
RDW: 13.8 % (ref 11.5–15.5)
WBC: 6.7 10*3/uL (ref 4.0–10.5)

## 2012-08-09 LAB — COMPREHENSIVE METABOLIC PANEL
Albumin: 2.5 g/dL — ABNORMAL LOW (ref 3.5–5.2)
Alkaline Phosphatase: 55 U/L (ref 39–117)
BUN: 37 mg/dL — ABNORMAL HIGH (ref 6–23)
Calcium: 8.7 mg/dL (ref 8.4–10.5)
GFR calc Af Amer: 9 mL/min — ABNORMAL LOW (ref >90)
Glucose, Bld: 70 mg/dL (ref 70–99)
Potassium: 3.8 mEq/L (ref 3.5–5.1)
Sodium: 140 mEq/L (ref 135–145)
Total Protein: 6.6 g/dL (ref 6.0–8.3)

## 2012-08-09 LAB — URINALYSIS, ROUTINE W REFLEX MICROSCOPIC
Bilirubin Urine: NEGATIVE
Glucose, UA: NEGATIVE mg/dL
Ketones, ur: 15 mg/dL — AB
Nitrite: NEGATIVE
Specific Gravity, Urine: 1.015 (ref 1.005–1.030)
pH: 5 (ref 5.0–8.0)

## 2012-08-09 LAB — PROTEIN / CREATININE RATIO, URINE
Protein Creatinine Ratio: 4.55 — ABNORMAL HIGH (ref 0.00–0.15)
Total Protein, Urine: 628.5 mg/dL

## 2012-08-09 LAB — URINE MICROSCOPIC-ADD ON

## 2012-08-09 LAB — IRON AND TIBC
Iron: 42 ug/dL (ref 42–135)
Saturation Ratios: 25 % (ref 20–55)
UIBC: 129 ug/dL (ref 125–400)

## 2012-08-09 LAB — RAPID URINE DRUG SCREEN, HOSP PERFORMED
Amphetamines: NOT DETECTED
Barbiturates: NOT DETECTED
Benzodiazepines: NOT DETECTED
Cocaine: NOT DETECTED

## 2012-08-09 LAB — PREGNANCY, URINE: Preg Test, Ur: NEGATIVE

## 2012-08-09 LAB — RETICULOCYTES: RBC.: 2.59 MIL/uL — ABNORMAL LOW (ref 3.87–5.11)

## 2012-08-09 LAB — VITAMIN B12: Vitamin B-12: 834 pg/mL (ref 211–911)

## 2012-08-09 LAB — FOLATE: Folate: 11 ng/mL

## 2012-08-09 MED ORDER — DARBEPOETIN ALFA-POLYSORBATE 100 MCG/0.5ML IJ SOLN
100.0000 ug | INTRAMUSCULAR | Status: DC
  Administered 2012-08-16 – 2012-08-23 (×2): 100 ug via SUBCUTANEOUS
  Filled 2012-08-09 (×5): qty 0.5

## 2012-08-09 MED ORDER — HALOPERIDOL 2 MG PO TABS
2.0000 mg | ORAL_TABLET | Freq: Four times a day (QID) | ORAL | Status: DC | PRN
  Filled 2012-08-09: qty 1

## 2012-08-09 MED ORDER — HYDRALAZINE HCL 20 MG/ML IJ SOLN: 10.0000 mg | Freq: Four times a day (QID) | INTRAMUSCULAR | Status: DC | PRN

## 2012-08-09 MED ORDER — ONDANSETRON HCL 4 MG/2ML IJ SOLN: 4.0000 mg | Freq: Four times a day (QID) | INTRAMUSCULAR | Status: DC | PRN

## 2012-08-09 MED ORDER — SODIUM CHLORIDE 0.9 % IV SOLN: INTRAVENOUS | Status: DC

## 2012-08-09 MED ORDER — SODIUM CHLORIDE 0.9 % IV SOLN
INTRAVENOUS | Status: DC
  Administered 2012-08-09: 04:00:00 via INTRAVENOUS

## 2012-08-09 MED ORDER — ONDANSETRON HCL 4 MG PO TABS: 4.0000 mg | ORAL_TABLET | Freq: Four times a day (QID) | ORAL | Status: DC | PRN

## 2012-08-09 MED ORDER — HALOPERIDOL LACTATE 5 MG/ML IJ SOLN
2.0000 mg | Freq: Four times a day (QID) | INTRAMUSCULAR | Status: DC | PRN
  Filled 2012-08-09: qty 1

## 2012-08-09 MED ORDER — ZIPRASIDONE MESYLATE 20 MG IM SOLR
40.0000 mg | Freq: Once | INTRAMUSCULAR | Status: AC | PRN
  Filled 2012-08-09: qty 40

## 2012-08-09 MED ORDER — POLYETHYLENE GLYCOL 3350 17 G PO PACK
17.0000 g | PACK | Freq: Every day | ORAL | Status: DC | PRN
  Filled 2012-08-09: qty 1

## 2012-08-09 MED ORDER — HEPARIN SODIUM (PORCINE) 5000 UNIT/ML IJ SOLN
5000.0000 [IU] | Freq: Three times a day (TID) | INTRAMUSCULAR | Status: DC
  Filled 2012-08-09 (×45): qty 1

## 2012-08-09 MED ORDER — HALOPERIDOL LACTATE 5 MG/ML IJ SOLN: 2.0000 mg | Freq: Four times a day (QID) | INTRAMUSCULAR | Status: DC | PRN

## 2012-08-09 MED ORDER — LORAZEPAM 2 MG/ML IJ SOLN: 1.0000 mg | INTRAMUSCULAR | Status: DC | PRN

## 2012-08-09 MED ORDER — ZIPRASIDONE MESYLATE 20 MG IM SOLR
40.0000 mg | INTRAMUSCULAR | Status: AC
  Filled 2012-08-09: qty 40

## 2012-08-09 MED ORDER — LORAZEPAM 1 MG PO TABS: 2.0000 mg | ORAL_TABLET | ORAL | Status: DC | PRN

## 2012-08-09 MED ORDER — ACETAMINOPHEN 650 MG RE SUPP: 650.0000 mg | Freq: Four times a day (QID) | RECTAL | Status: DC | PRN

## 2012-08-09 MED ORDER — HALOPERIDOL LACTATE 5 MG/ML IJ SOLN
5.0000 mg | Freq: Four times a day (QID) | INTRAMUSCULAR | Status: DC | PRN
  Filled 2012-08-09: qty 1

## 2012-08-09 MED ORDER — LORAZEPAM 2 MG/ML IJ SOLN
2.0000 mg | INTRAMUSCULAR | Status: DC | PRN
  Filled 2012-08-09: qty 1

## 2012-08-09 MED ORDER — HALOPERIDOL 5 MG PO TABS
5.0000 mg | ORAL_TABLET | Freq: Four times a day (QID) | ORAL | Status: DC | PRN
  Filled 2012-08-09: qty 1

## 2012-08-09 MED ORDER — HALOPERIDOL 5 MG PO TABS: 5.0000 mg | ORAL_TABLET | Freq: Four times a day (QID) | ORAL | Status: DC | PRN

## 2012-08-09 MED ORDER — LORAZEPAM 1 MG PO TABS
1.0000 mg | ORAL_TABLET | ORAL | Status: DC | PRN
  Filled 2012-08-09: qty 1

## 2012-08-09 MED ORDER — ACETAMINOPHEN 325 MG PO TABS
650.0000 mg | ORAL_TABLET | Freq: Four times a day (QID) | ORAL | Status: DC | PRN
  Administered 2012-08-13 – 2012-08-19 (×2): 650 mg via ORAL
  Filled 2012-08-09 (×2): qty 2
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
## 2012-08-09 NOTE — Progress Notes (Signed)
Sara [name] 32 y.o. Called to the floor for patients escalating mood. She is again threatening to leave and is threatening to staff. She is psychotic and has a standing mental illness history. She is IVC and is not medically cleared at this time due to her renal function. ACT team can not evaluate an inpatient and  An attempt to contact Dr. Lolly Mustache, resulted in voicemail. Left message for him to return my call and see patient ASAP.  If patient attempts to leave the room or cause harm to herself or anyone else, she is to be physically restrained and then Geodon 40 mg IM will be administered.

## 2012-08-09 NOTE — Progress Notes (Signed)
Patient refused haldol and nicotine patch.  Patient verbally aggressive.  MD made aware.  Contact made with Dr. Briant Cedar who endorses he will see the patient on his rounds.  Patient made aware that renal MD will be by to see her.

## 2012-08-09 NOTE — Progress Notes (Addendum)
Pt unset that she cannot use the restroom without closing the door, states this is what she has been doing. Educated pt on suicide precautions and informed that door cannot be shut, must at least be cracked.  Pt very agitated and irritated but agreeable. Requesting to take shower, advised of the same that she cannot shut the door. Pt agrees and understands. Paged MD, who states ok to shower as long as she is supervised. Pt made aware and sitter at bedside, given materials for shower. Pt calm and cooperative at this time, will continue to monitor.  Pt pulled out IV on dayshift, attempted to restart but pt declined stating she did not want/need any IV meds.  Pt with elevated BP this AM, unable to given PRN meds d/t not IV access. MD([place]) made aware, orders received. Pt still refusing PO meds for BP. Educated on potential risk associated with elevated BP, pt states she understands but does not want any of the medications. State that being in the hospital is the reason her BP is elevated and she has the right to refused any medication. Pt does c/o headaches but no other complaints, will continue monitoring.

## 2012-08-09 NOTE — H&P (Signed)
FMTS Attending Admit Note Patient seen and examined by me, discussed with resident team and I agree with Dr Purvis Sheffield assessment and plan.  Briefly, patient transferred to [place] for medical clearance after involuntary commitment to [facility] was in process at [facility] ED for psychosis.  In the course of her workup she was found to have acute kidney injury with serum Cr 6.9, up from mid-1's two months ago.  She has been agitated and combative toward staff and is considered a high risk for elopement.   This morning she is awake and alert; she is oriented to "[facility]" and knows she is here for kidney disease.  She is asking for renal US to be done soon so she can make her 1pm job interview at [employer].    Labs and studies reviewed in chart.  A/P: Patient with AKI, workup underway and including renal US, ANA, ANCA studies, complement studies, SPEP/UPEP.  Would add HIV to her labs this morning as well.  Nephrology service is being consulted for assistance in her workup. Psychiatry to be consulted during this inpatient stay if they are not already involved in her care actively at this point.  She requires a 1:1 sitter for high risk of elopement. As soon as her medical workup is completed, for transfer to [facility]. Paula Compton, MD

## 2012-08-09 NOTE — Progress Notes (Signed)
Sara [name] 32 y.o.  Patient is in danger medically and needs medical clearance prior to return to [facility]. She has taken her utensils from her lunch tray. One has to presume this was for a weapon or to cause self arm. She is threatening to leave, to go to a interview, for a job at [employer]. Her room is currently being searched for any potential harmful objects and nursing and security has been instructed to restrain patient from leaving until medically cleared. PRN chemical agent has been prescribed for restraint, however patient has refused medications. Patient will need a physical restraint if she attempts to cause self harm, harm to others or vacate the room.

## 2012-08-09 NOTE — Progress Notes (Signed)
Called to patient room by the nurse tech.  Patient found to be walking around the room anxiously.  Patient is complaining of her breakfast tray.  Security has been called and is on standby.  This RN offered the patient a bowl of corn flakes which she was thankful for.  Patient now seated in bed with her bed alarm in place.  MD just arrived to floor.  Will continue to monitor.

## 2012-08-09 NOTE — ED Notes (Addendum)
Patient has pulled her IV out, taken BP cuff off and removed cardiac monitor leads. Refuses to have IV restarted. [facility] here to transport patient to [facility]. [employer] officer being called to ride with patient due to patient being under IVC.

## 2012-08-09 NOTE — Progress Notes (Signed)
FMTS Attending Admission Note: Sara Franqui,MD I  have seen and examined this patient, reviewed their chart. I have discussed this patient with the resident. I agree with the resident's findings, assessment and care plan.  As discussed with the resident,patient might need 1:1 observation since she is high risk for eloping,I also suggest Psychiatry evaluation inpatient for medication adjustment and management. Dr De Nurse will get in touch with them.

## 2012-08-09 NOTE — Progress Notes (Signed)
Family Medicine Teaching Service Daily Progress Note Service Page: [phone]  Subjective: patient states wants to leave. States she is only here to figure out what is wrong with her kidneys and blood pressure. When advised that she has to stay due to being involuntarily committed the patient becomes agitated and irate saying that the police are just here to protect and serve and that she called them to help her, not arrest her.  Objective: Temp:  [98.1 F (36.7 C)-98.4 F (36.9 C)] 98.2 F (36.8 C) (05/05 0637) Pulse Rate:  [74-99] 94 (05/05 0637) Resp:  [18-20] 20 (05/05 0637) BP: (126-163)/(79-107) 163/101 mmHg (05/05 0637) SpO2:  [96 %-100 %] 99 % (05/05 0637) Weight:  [230 lb 6.1 oz (104.5 kg)] 230 lb 6.1 oz (104.5 kg) (05/05 0217) Exam: General: NAD, laying comfortably in bed Cardiovascular: rrr, no mrg Respiratory: CTAB, no wheezes or crackles Extremities: trace edema Psych: tangential thoughts, pressured speech, difficult to redirect  CBC BMET   Recent Labs Lab 08/08/12 1610 08/09/12 0308  WBC 5.4 6.7  HGB 7.4* 7.5*  HCT 22.4* 23.3*  PLT 233 212    Recent Labs Lab 08/08/12 1610 08/09/12 0308  NA 143 140  K 3.5 3.8  CL 109 107  CO2 21 20  BUN 37* 37*  CREATININE 6.92* 6.73*  GLUCOSE 90 70  CALCIUM 8.9 8.7     Results for orders placed during the hospital encounter of 08/08/12 (from the past 24 hour(s))  CBC WITH DIFFERENTIAL     Status: Abnormal   Collection Time    08/08/12  4:10 PM      Result Value Range   WBC 5.4  4.0 - 10.5 K/uL   RBC 2.69 (*) 3.87 - 5.11 MIL/uL   Hemoglobin 7.4 (*) 12.0 - 15.0 g/dL   HCT 24.4 (*) 01.0 - 27.2 %   MCV 83.3  78.0 - 100.0 fL   MCH 27.5  26.0 - 34.0 pg   MCHC 33.0  30.0 - 36.0 g/dL   RDW 53.6  64.4 - 03.4 %   Platelets 233  150 - 400 K/uL   Neutrophils Relative 54  43 - 77 %   Neutro Abs 2.9  1.7 - 7.7 K/uL   Lymphocytes Relative 40  12 - 46 %   Lymphs Abs 2.2  0.7 - 4.0 K/uL   Monocytes Relative 6  3 - 12 %   Monocytes Absolute 0.3  0.1 - 1.0 K/uL   Eosinophils Relative 0  0 - 5 %   Eosinophils Absolute 0.0  0.0 - 0.7 K/uL   Basophils Relative 0  0 - 1 %   Basophils Absolute 0.0  0.0 - 0.1 K/uL  BASIC METABOLIC PANEL     Status: Abnormal   Collection Time    08/08/12  4:10 PM      Result Value Range   Sodium 143  135 - 145 mEq/L   Potassium 3.5  3.5 - 5.1 mEq/L   Chloride 109  96 - 112 mEq/L   CO2 21  19 - 32 mEq/L   Glucose, Bld 90  70 - 99 mg/dL   BUN 37 (*) 6 - 23 mg/dL   Creatinine, Ser 7.42 (*) 0.50 - 1.10 mg/dL   Calcium 8.9  8.4 - 59.5 mg/dL   GFR calc non Af Amer 7 (*) >90 mL/min   GFR calc Af Amer 8 (*) >90 mL/min  ACETAMINOPHEN LEVEL     Status: None   Collection Time  08/08/12  4:10 PM      Result Value Range   Acetaminophen (Tylenol), Serum <15.0  10 - 30 ug/mL  SALICYLATE LEVEL     Status: Abnormal   Collection Time    08/08/12  4:10 PM      Result Value Range   Salicylate Lvl <2.0 (*) 2.8 - 20.0 mg/dL  ETHANOL     Status: None   Collection Time    08/08/12  4:10 PM      Result Value Range   Alcohol, Ethyl (B) <11  0 - 11 mg/dL  COMPREHENSIVE METABOLIC PANEL     Status: Abnormal   Collection Time    08/09/12  3:08 AM      Result Value Range   Sodium 140  135 - 145 mEq/L   Potassium 3.8  3.5 - 5.1 mEq/L   Chloride 107  96 - 112 mEq/L   CO2 20  19 - 32 mEq/L   Glucose, Bld 70  70 - 99 mg/dL   BUN 37 (*) 6 - 23 mg/dL   Creatinine, Ser 0.98 (*) 0.50 - 1.10 mg/dL   Calcium 8.7  8.4 - 11.9 mg/dL   Total Protein 6.6  6.0 - 8.3 g/dL   Albumin 2.5 (*) 3.5 - 5.2 g/dL   AST 28  0 - 37 U/L   ALT 9  0 - 35 U/L   Alkaline Phosphatase 55  39 - 117 U/L   Total Bilirubin 0.3  0.3 - 1.2 mg/dL   GFR calc non Af Amer 7 (*) >90 mL/min   GFR calc Af Amer 9 (*) >90 mL/min  CBC     Status: Abnormal   Collection Time    08/09/12  3:08 AM      Result Value Range   WBC 6.7  4.0 - 10.5 K/uL   RBC 2.81 (*) 3.87 - 5.11 MIL/uL   Hemoglobin 7.5 (*) 12.0 - 15.0 g/dL   HCT 14.7  (*) 82.9 - 46.0 %   MCV 82.9  78.0 - 100.0 fL   MCH 26.7  26.0 - 34.0 pg   MCHC 32.2  30.0 - 36.0 g/dL   RDW 56.2  13.0 - 86.5 %   Platelets 212  150 - 400 K/uL  PREGNANCY, URINE     Status: None   Collection Time    08/09/12  3:46 AM      Result Value Range   Preg Test, Ur NEGATIVE  NEGATIVE  URINALYSIS, ROUTINE W REFLEX MICROSCOPIC     Status: Abnormal   Collection Time    08/09/12  3:47 AM      Result Value Range   Color, Urine YELLOW  YELLOW   APPearance CLOUDY (*) CLEAR   Specific Gravity, Urine 1.015  1.005 - 1.030   pH 5.0  5.0 - 8.0   Glucose, UA NEGATIVE  NEGATIVE mg/dL   Hgb urine dipstick MODERATE (*) NEGATIVE   Bilirubin Urine NEGATIVE  NEGATIVE   Ketones, ur 15 (*) NEGATIVE mg/dL   Protein, ur >784 (*) NEGATIVE mg/dL   Urobilinogen, UA 0.2  0.0 - 1.0 mg/dL   Nitrite NEGATIVE  NEGATIVE   Leukocytes, UA NEGATIVE  NEGATIVE  URINE RAPID DRUG SCREEN (HOSP PERFORMED)     Status: Abnormal   Collection Time    08/09/12  3:47 AM      Result Value Range   Opiates NONE DETECTED  NONE DETECTED   Cocaine NONE DETECTED  NONE DETECTED   Benzodiazepines  NONE DETECTED  NONE DETECTED   Amphetamines NONE DETECTED  NONE DETECTED   Tetrahydrocannabinol POSITIVE (*) NONE DETECTED   Barbiturates NONE DETECTED  NONE DETECTED  PROTEIN / CREATININE RATIO, URINE     Status: Abnormal   Collection Time    08/09/12  3:47 AM      Result Value Range   Creatinine, Urine 138.03     Total Protein, Urine 628.5     PROTEIN CREATININE RATIO 4.55 (*) 0.00 - 0.15  URINE MICROSCOPIC-ADD ON     Status: Abnormal   Collection Time    08/09/12  3:47 AM      Result Value Range   Squamous Epithelial / LPF RARE  RARE   RBC / HPF 3-6  <3 RBC/hpf   Bacteria, UA MANY (*) RARE   Casts GRANULAR CAST (*) NEGATIVE  RETICULOCYTES     Status: Abnormal   Collection Time    08/09/12  5:35 AM      Result Value Range   Retic Ct Pct 1.0  0.4 - 3.1 %   RBC. 2.59 (*) 3.87 - 5.11 MIL/uL   Retic Count, Manual  25.9  19.0 - 186.0 K/uL    Assessment/Plan: AVEN CEGIELSKI is a 32 y.o. year old female with long psych history including multiple IVC for psychosis behavior, HTN, proteinuria, and recent rapid elevation in Cr presenting as transfer from [facility] ED for evaluation of renal function prior to [facility] placement.   1. Renal: patient with rapidly progressing renal decline. Has had increase in Cr to 6.92 from 1.22 in just over 2 months in addition to proteinuria, hematuria, and edema. 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 Must consider SLE as cause though patient does not have arthralgias or rash. Other causes could inlcude vasculitis, hep B and C infections, and uncontrolled BP.  -will c/s renal this morning to have them evaluate the patient for further work-up-possible biopsy  -will obtain renal US, ANA, ANCA, C3, C4, SPEP, UPEP,  -urine protein/Cr ratio elevated to 4.55 -UA with >300 protein, moderate Hgb, micro granular casts -will place on IVF @ 75 mL/hr  -Cr slightly improved on am CMET-will continue to follow  -will discontinue ACE inhibitor given elevation in Cr-patient never started this  2. Psych: patient initially brought to ED for psych issues. Was initially irate and difficult to control, though following administration of geodon and ativan became more reasonable. Currently comfortable in bed and appropriately behaving.  -ativan prn for aggitation  -will consider risperdal for treatment of acute psychosis if needed given previous [facility] note  -will touch base with behavioral health in the morning for further recs and evaluation  -sitter at bedside   3. CV: with recent diagnosis of HTN. Recently started on ACE for HTN and renal benefit.  -will stop ACE given worsening renal function  -vitals per unit protocol  -hydralazine 10 mg prn for BP >160/110  -consider addition of norvasc for BP control  -will monitor vitals   4. Heme:  patient with hgb 7.4, down from 8.8 one week ago, though baseline appears to be somewhere between 7.4 and 8.8 with 2 additional readings of 7.6 and 8.4 in the past 2 months. Likely related to renal cause. No complaints of blood loss at this time to indicate source of anemia.  -will monitor on CBC  -will obtain retic, B12, folate, iron, ferritin to evaluate for other contributors  -will likely benefit from aranesp if related  to renal function   FEN/GI: heart healthy diet, NS 75 mL/hr  Prophylaxis: SQ heparin  Disposition: admit to med surg, patient is under IVC and awaiting medical clearance prior to going to [facility]  Code Status: full   Marikay Alar, MD 08/09/2012, 7:21 AM
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
## 2012-08-09 NOTE — ED Notes (Signed)
Report given to [facility]. ETA 10 mins.
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
## 2012-08-10 LAB — CBC
HCT: 21.3 % — ABNORMAL LOW (ref 36.0–46.0)
MCH: 26.7 pg (ref 26.0–34.0)
MCV: 80.1 fL (ref 78.0–100.0)
Platelets: 179 10*3/uL (ref 150–400)
RBC: 2.66 MIL/uL — ABNORMAL LOW (ref 3.87–5.11)
WBC: 4.3 10*3/uL (ref 4.0–10.5)

## 2012-08-10 LAB — RENAL FUNCTION PANEL
BUN: 34 mg/dL — ABNORMAL HIGH (ref 6–23)
CO2: 23 mEq/L (ref 19–32)
Calcium: 8.5 mg/dL (ref 8.4–10.5)
Chloride: 108 mEq/L (ref 96–112)
Creatinine, Ser: 6.68 mg/dL — ABNORMAL HIGH (ref 0.50–1.10)
GFR calc non Af Amer: 7 mL/min — ABNORMAL LOW (ref >90)

## 2012-08-10 MED ORDER — AMLODIPINE BESYLATE 5 MG PO TABS
5.0000 mg | ORAL_TABLET | Freq: Every day | ORAL | Status: DC
  Administered 2012-08-10 – 2012-08-13 (×2): 5 mg via ORAL
  Filled 2012-08-10 (×5): qty 1

## 2012-08-10 MED ORDER — CLONIDINE HCL 0.1 MG PO TABS
0.1000 mg | ORAL_TABLET | ORAL | Status: DC | PRN
  Administered 2012-08-11 – 2012-08-13 (×3): 0.1 mg via ORAL
  Filled 2012-08-10 (×3): qty 1

## 2012-08-10 NOTE — Progress Notes (Signed)
FMTS Attending Admission Note: Sara Murri,MD I  have seen and examined this patient, reviewed their chart. I have discussed this patient with the resident. I agree with the resident's findings, assessment and care plan.  Patient stable this morning,denies any concern,she is however refusing some treatment,declined renal biopsy,she does not want to start her psych medication,she is not currently aggressive but will appreciate psych follow up to start patient on medication as well as to assess for capacity to make her own medical decision.

## 2012-08-10 NOTE — Progress Notes (Signed)
S:She remembers me and what we talked about but says she would do renal bx only if she can go home first?  Says she refused her haldol O:BP 172/109  Pulse 96  Temp(Src) 98.7 F (37.1 C) (Oral)  Resp 20  Ht 5\' 7"  (1.702 m)  Wt 106.3 kg (234 lb 5.6 oz)  BMI 36.7 kg/m2  SpO2 97%  Intake/Output Summary (Last 24 hours) at 08/10/12 0955 Last data filed at 08/10/12 8295  Gross per 24 hour  Intake    720 ml  Output   1552 ml  Net   -832 ml   Weight change: 1.8 kg (3 lb 15.5 oz) AOZ:HYQMV and alert CVS:RRR Resp:clear Abd:+BS NTND Ext:0-tr edema NEURO:CNI She does not appear competent to me   . amLODipine  5 mg Oral Daily  . darbepoetin (ARANESP) injection - NON-DIALYSIS  100 mcg Subcutaneous Q Mon-1800  . heparin  5,000 Units Subcutaneous Q8H  . nicotine  21 mg Transdermal Daily  . ziprasidone  40 mg Intramuscular STAT   US Renal  08/09/2012  *RADIOLOGY REPORT*  Clinical Data: Rapid progression of renal failure  RENAL/URINARY TRACT ULTRASOUND COMPLETE  Comparison:  None.  Findings:  Right Kidney:  Markedly increased renal parenchymal echogenicity. No hydronephrosis or solid renal lesion.  12.7 cm in length.  Left Kidney:  Markedly increased renal parenchymal echogenicity. No hydronephrosis or solid renal lesion.  12.1 cm in length.  Bladder: The bladder is under distended.  IMPRESSION:  Marked renal parenchymal echogenicity suggest underlying medical renal disease.  No hydronephrosis.   Original Report Authenticated By: Malachy Moan, M.D.    BMET    Component Value Date/Time   NA 140 08/09/2012 0308   K 3.8 08/09/2012 0308   CL 107 08/09/2012 0308   CO2 20 08/09/2012 0308   GLUCOSE 70 08/09/2012 0308   BUN 37* 08/09/2012 0308   CREATININE 6.73* 08/09/2012 0308   CREATININE 4.37* 07/30/2012 1544   CALCIUM 8.7 08/09/2012 0308   GFRNONAA 7* 08/09/2012 0308   GFRAA 9* 08/09/2012 0308   CBC    Component Value Date/Time   WBC 4.3 08/10/2012 0857   RBC 2.66* 08/10/2012 0857   HGB 7.1* 08/10/2012 0857    HCT 21.3* 08/10/2012 0857   PLT 179 08/10/2012 0857   MCV 80.1 08/10/2012 0857   MCH 26.7 08/10/2012 0857   MCHC 33.3 08/10/2012 0857   RDW 13.8 08/10/2012 0857   LYMPHSABS 2.2 08/08/2012 1610   MONOABS 0.3 08/08/2012 1610   EOSABS 0.0 08/08/2012 1610   BASOSABS 0.0 08/08/2012 1610     Assessment: 1. Subacute renal failure with nephrotic syndrome 2. HTN 3. Acute psychosis Plan: 1. Await serologic studies 2.  She will most likely need a biopsy but unsure if she will cooperate 3.  Check Scr today 4.  She is not medically stable to go to behavioral health.  Psychiatry will need to control her psychosis at this hospital so hopefully we can appropriately address her medical issues   Sara [name] T

## 2012-08-10 NOTE — Progress Notes (Signed)
FMTS Interval Progress Note  Spoke to patient regarding need to start on medication to treat her psychosis. She stated she did not want medication for this, feeling that she was fine and did not need medication. When told we would potentially start her on seroquel she stated this was heroine based and she would not take this. When discussed risperdal, she stated this was cocaine based and she would not start this.  Will await psych recommendations regarding treatment. Question of whether or not she has capacity to turn down treatment at this time.  Marikay Alar, MD PGY1 FMTS
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
## 2012-08-10 NOTE — Progress Notes (Signed)
Family Medicine Teaching Service Daily Progress Note Service Page: [phone]  Subjective: Doing ok this morning. States took a blood pressure medication this morning. Still asking when she gets to go home.  Objective: Temp:  [98.4 F (36.9 C)-98.8 F (37.1 C)] 98.7 F (37.1 C) (05/06 0548) Pulse Rate:  [88-100] 96 (05/06 0548) Resp:  [20] 20 (05/06 0548) BP: (157-172)/(99-109) 172/109 mmHg (05/06 0548) SpO2:  [97 %-100 %] 97 % (05/06 0548) Weight:  [234 lb 5.6 oz (106.3 kg)] 234 lb 5.6 oz (106.3 kg) (05/05 2104) Exam: General: NAD, laying comfortably in bed Cardiovascular: rrr, no mrg Respiratory: CTAB, no wheezes or crackles Extremities: trace edema Psych: tangential thoughts, pressured speech, difficult to redirect  CBC BMET   Recent Labs Lab 08/08/12 1610 08/09/12 0308  WBC 5.4 6.7  HGB 7.4* 7.5*  HCT 22.4* 23.3*  PLT 233 212    Recent Labs Lab 08/08/12 1610 08/09/12 0308  NA 143 140  K 3.5 3.8  CL 109 107  CO2 21 20  BUN 37* 37*  CREATININE 6.92* 6.73*  GLUCOSE 90 70  CALCIUM 8.9 8.7     Results for orders placed during the hospital encounter of 08/08/12 (from the past 48 hour(s))  CBC WITH DIFFERENTIAL     Status: Abnormal   Collection Time    08/08/12  4:10 PM      Result Value Range   WBC 5.4  4.0 - 10.5 K/uL   RBC 2.69 (*) 3.87 - 5.11 MIL/uL   Hemoglobin 7.4 (*) 12.0 - 15.0 g/dL   HCT 14.7 (*) 82.9 - 56.2 %   MCV 83.3  78.0 - 100.0 fL   MCH 27.5  26.0 - 34.0 pg   MCHC 33.0  30.0 - 36.0 g/dL   RDW 13.0  86.5 - 78.4 %   Platelets 233  150 - 400 K/uL   Neutrophils Relative 54  43 - 77 %   Neutro Abs 2.9  1.7 - 7.7 K/uL   Lymphocytes Relative 40  12 - 46 %   Lymphs Abs 2.2  0.7 - 4.0 K/uL   Monocytes Relative 6  3 - 12 %   Monocytes Absolute 0.3  0.1 - 1.0 K/uL   Eosinophils Relative 0  0 - 5 %   Eosinophils Absolute 0.0  0.0 - 0.7 K/uL   Basophils Relative 0  0 - 1 %   Basophils Absolute 0.0  0.0 - 0.1 K/uL  BASIC METABOLIC PANEL     Status:  Abnormal   Collection Time    08/08/12  4:10 PM      Result Value Range   Sodium 143  135 - 145 mEq/L   Potassium 3.5  3.5 - 5.1 mEq/L   Chloride 109  96 - 112 mEq/L   CO2 21  19 - 32 mEq/L   Glucose, Bld 90  70 - 99 mg/dL   BUN 37 (*) 6 - 23 mg/dL   Creatinine, Ser 6.96 (*) 0.50 - 1.10 mg/dL   Calcium 8.9  8.4 - 29.5 mg/dL   GFR calc non Af Amer 7 (*) >90 mL/min   GFR calc Af Amer 8 (*) >90 mL/min   Comment:            The eGFR has been calculated     using the CKD EPI equation.     This calculation has not been     validated in all clinical     situations.     eGFR's  persistently     <90 mL/min signify     possible Chronic Kidney Disease.  ACETAMINOPHEN LEVEL     Status: None   Collection Time    08/08/12  4:10 PM      Result Value Range   Acetaminophen (Tylenol), Serum <15.0  10 - 30 ug/mL   Comment:            THERAPEUTIC CONCENTRATIONS VARY     SIGNIFICANTLY. A RANGE OF 10-30     ug/mL MAY BE AN EFFECTIVE     CONCENTRATION FOR MANY PATIENTS.     HOWEVER, SOME ARE BEST TREATED     AT CONCENTRATIONS OUTSIDE THIS     RANGE.     ACETAMINOPHEN CONCENTRATIONS     >150 ug/mL AT 4 HOURS AFTER     INGESTION AND >50 ug/mL AT 12     HOURS AFTER INGESTION ARE     OFTEN ASSOCIATED WITH TOXIC     REACTIONS.  SALICYLATE LEVEL     Status: Abnormal   Collection Time    08/08/12  4:10 PM      Result Value Range   Salicylate Lvl <2.0 (*) 2.8 - 20.0 mg/dL  ETHANOL     Status: None   Collection Time    08/08/12  4:10 PM      Result Value Range   Alcohol, Ethyl (B) <11  0 - 11 mg/dL   Comment:            LOWEST DETECTABLE LIMIT FOR     SERUM ALCOHOL IS 11 mg/dL     FOR MEDICAL PURPOSES ONLY  HEMOGLOBIN A1C     Status: None   Collection Time    08/09/12  3:07 AM      Result Value Range   Hemoglobin A1C 5.3  <5.7 %   Comment: (NOTE)                                                                               According to the ADA Clinical Practice Recommendations for 2011,  when     HbA1c is used as a screening test:      >=6.5%   Diagnostic of Diabetes Mellitus               (if abnormal result is confirmed)     5.7-6.4%   Increased risk of developing Diabetes Mellitus     References:Diagnosis and Classification of Diabetes Mellitus,Diabetes     Care,2011,34(Suppl 1):S62-S69 and Standards of Medical Care in             Diabetes - 2011,Diabetes Care,2011,34 (Suppl 1):S11-S61.   Mean Plasma Glucose 105  <117 mg/dL  C3 COMPLEMENT     Status: Abnormal   Collection Time    08/09/12  3:07 AM      Result Value Range   C3 Complement 81 (*) 90 - 180 mg/dL  C4 COMPLEMENT     Status: None   Collection Time    08/09/12  3:07 AM      Result Value Range   Complement C4, Body Fluid 22  10 - 40 mg/dL  COMPREHENSIVE METABOLIC PANEL     Status: Abnormal  Collection Time    08/09/12  3:08 AM      Result Value Range   Sodium 140  135 - 145 mEq/L   Potassium 3.8  3.5 - 5.1 mEq/L   Chloride 107  96 - 112 mEq/L   CO2 20  19 - 32 mEq/L   Glucose, Bld 70  70 - 99 mg/dL   BUN 37 (*) 6 - 23 mg/dL   Creatinine, Ser 1.61 (*) 0.50 - 1.10 mg/dL   Calcium 8.7  8.4 - 09.6 mg/dL   Total Protein 6.6  6.0 - 8.3 g/dL   Albumin 2.5 (*) 3.5 - 5.2 g/dL   AST 28  0 - 37 U/L   ALT 9  0 - 35 U/L   Alkaline Phosphatase 55  39 - 117 U/L   Total Bilirubin 0.3  0.3 - 1.2 mg/dL   GFR calc non Af Amer 7 (*) >90 mL/min   GFR calc Af Amer 9 (*) >90 mL/min   Comment:            The eGFR has been calculated     using the CKD EPI equation.     This calculation has not been     validated in all clinical     situations.     eGFR's persistently     <90 mL/min signify     possible Chronic Kidney Disease.  CBC     Status: Abnormal   Collection Time    08/09/12  3:08 AM      Result Value Range   WBC 6.7  4.0 - 10.5 K/uL   RBC 2.81 (*) 3.87 - 5.11 MIL/uL   Hemoglobin 7.5 (*) 12.0 - 15.0 g/dL   HCT 04.5 (*) 40.9 - 81.1 %   MCV 82.9  78.0 - 100.0 fL   MCH 26.7  26.0 - 34.0 pg   MCHC 32.2   30.0 - 36.0 g/dL   RDW 91.4  78.2 - 95.6 %   Platelets 212  150 - 400 K/uL  PREGNANCY, URINE     Status: None   Collection Time    08/09/12  3:46 AM      Result Value Range   Preg Test, Ur NEGATIVE  NEGATIVE   Comment:            THE SENSITIVITY OF THIS     METHODOLOGY IS >20 mIU/mL.  URINALYSIS, ROUTINE W REFLEX MICROSCOPIC     Status: Abnormal   Collection Time    08/09/12  3:47 AM      Result Value Range   Color, Urine YELLOW  YELLOW   APPearance CLOUDY (*) CLEAR   Specific Gravity, Urine 1.015  1.005 - 1.030   pH 5.0  5.0 - 8.0   Glucose, UA NEGATIVE  NEGATIVE mg/dL   Hgb urine dipstick MODERATE (*) NEGATIVE   Bilirubin Urine NEGATIVE  NEGATIVE   Ketones, ur 15 (*) NEGATIVE mg/dL   Protein, ur >213 (*) NEGATIVE mg/dL   Urobilinogen, UA 0.2  0.0 - 1.0 mg/dL   Nitrite NEGATIVE  NEGATIVE   Leukocytes, UA NEGATIVE  NEGATIVE  URINE RAPID DRUG SCREEN (HOSP PERFORMED)     Status: Abnormal   Collection Time    08/09/12  3:47 AM      Result Value Range   Opiates NONE DETECTED  NONE DETECTED   Cocaine NONE DETECTED  NONE DETECTED   Benzodiazepines NONE DETECTED  NONE DETECTED   Amphetamines NONE DETECTED  NONE DETECTED  Tetrahydrocannabinol POSITIVE (*) NONE DETECTED   Barbiturates NONE DETECTED  NONE DETECTED   Comment:            DRUG SCREEN FOR MEDICAL PURPOSES     ONLY.  IF CONFIRMATION IS NEEDED     FOR ANY PURPOSE, NOTIFY LAB     WITHIN 5 DAYS.                LOWEST DETECTABLE LIMITS     FOR URINE DRUG SCREEN     Drug Class       Cutoff (ng/mL)     Amphetamine      1000     Barbiturate      200     Benzodiazepine   200     Tricyclics       300     Opiates          300     Cocaine          300     THC              50  PROTEIN / CREATININE RATIO, URINE     Status: Abnormal   Collection Time    08/09/12  3:47 AM      Result Value Range   Creatinine, Urine 138.03     Total Protein, Urine 628.5     Comment: RESULTS CONFIRMED BY MANUAL DILUTION     NO NORMAL  RANGE ESTABLISHED FOR THIS TEST   PROTEIN CREATININE RATIO 4.55 (*) 0.00 - 0.15  URINE MICROSCOPIC-ADD ON     Status: Abnormal   Collection Time    08/09/12  3:47 AM      Result Value Range   Squamous Epithelial / LPF RARE  RARE   RBC / HPF 3-6  <3 RBC/hpf   Bacteria, UA MANY (*) RARE   Casts GRANULAR CAST (*) NEGATIVE  VITAMIN B12     Status: None   Collection Time    08/09/12  5:35 AM      Result Value Range   Vitamin B-12 834  211 - 911 pg/mL  FOLATE     Status: None   Collection Time    08/09/12  5:35 AM      Result Value Range   Folate 11.0     Comment: (NOTE)     Reference Ranges            Deficient:       0.4 - 3.3 ng/mL            Indeterminate:   3.4 - 5.4 ng/mL            Normal:              > 5.4 ng/mL  IRON AND TIBC     Status: Abnormal   Collection Time    08/09/12  5:35 AM      Result Value Range   Iron 42  42 - 135 ug/dL   TIBC 409 (*) 811 - 914 ug/dL   Saturation Ratios 25  20 - 55 %   UIBC 129  125 - 400 ug/dL  FERRITIN     Status: Abnormal   Collection Time    08/09/12  5:35 AM      Result Value Range   Ferritin 562 (*) 10 - 291 ng/mL  RETICULOCYTES     Status: Abnormal   Collection Time    08/09/12  5:35 AM  Result Value Range   Retic Ct Pct 1.0  0.4 - 3.1 %   RBC. 2.59 (*) 3.87 - 5.11 MIL/uL   Retic Count, Manual 25.9  19.0 - 186.0 K/uL  HIV ANTIBODY (ROUTINE TESTING)     Status: None   Collection Time    08/09/12  9:36 AM      Result Value Range   HIV NON REACTIVE  NON REACTIVE   US Renal  08/09/2012  *RADIOLOGY REPORT*  Clinical Data: Rapid progression of renal failure  RENAL/URINARY TRACT ULTRASOUND COMPLETE  Comparison:  None.  Findings:  Right Kidney:  Markedly increased renal parenchymal echogenicity. No hydronephrosis or solid renal lesion.  12.7 cm in length.  Left Kidney:  Markedly increased renal parenchymal echogenicity. No hydronephrosis or solid renal lesion.  12.1 cm in length.  Bladder: The bladder is under distended.   IMPRESSION:  Marked renal parenchymal echogenicity suggest underlying medical renal disease.  No hydronephrosis.   Original Report Authenticated By: Malachy Moan, M.D.      Assessment/Plan: Sara [name] is a 32 y.o. 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   1. Renal: patient with rapidly progressing renal decline. Has had increase in Cr to 6.92 from 1.22 in just over 2 months in addition to proteinuria, hematuria, and edema. 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 Must consider SLE as cause though patient does not have arthralgias or rash. Other causes could inlcude vasculitis, hep B and C infections, and uncontrolled BP.  -will c/s renal-appreciate their recommendations-state patient will remain in hospital until renal biopsy is possible  -f/u ANA, ANCA, SPEP, UPEP  -urine protein/Cr ratio elevated to 4.55 -C3 low, C4 normal -renal US with medical renal disease -UA with >300 protein, moderate Hgb, micro granular casts -Cr slightly improved on am CMET-will continue to follow  -will discontinue ACE inhibitor given elevation in Cr-patient never started this  2. Psych: patient initially brought to ED for psych issues. Was initially irate and difficult to control, though following administration of geodon and ativan became more reasonable. Has had several episodes of outbursts and irate behavior, though has been refusing any medications.  -ativan and haldol prn for aggitation  -will touch base with behavioral health in the morning for further recs-need them to treat the patient while she is in hospital so she can be stable enough for completion of renal work-up -sitter at bedside   3. CV: with recent diagnosis of HTN. Recently started on ACE  for HTN and renal benefit.  -will stop ACE given worsening renal function  -vitals per unit protocol  -start norvasc 5 mg daily for BP control -hydralazine 10 mg prn for BP >160/110  -will monitor vitals   4. Heme: patient with hgb 7.4, down from 8.8 one week ago, though baseline appears to be somewhere between 7.4 and 8.8 with 2 additional readings of 7.6 and 8.4 in the past 2 months. Likely related to renal cause. No complaints of blood loss at this time to indicate source of anemia.  -will monitor on CBC  -anemia panel indicates anemia of chronic disease  -started on aranesp   FEN/GI: heart healthy diet  Prophylaxis: SQ heparin  Disposition: patient is under IVC and awaiting medical clearance prior to going to [facility]  Code Status: full  Marikay Alar, MD 08/10/2012, 6:51 AM

## 2012-08-11 ENCOUNTER — Ambulatory Visit: Payer: Medicare HMO | Admitting: Family Medicine

## 2012-08-11 DIAGNOSIS — R809 Proteinuria, unspecified: Secondary | ICD-10-CM

## 2012-08-11 LAB — PROTIME-INR: Prothrombin Time: 14.2 seconds (ref 11.6–15.2)

## 2012-08-11 LAB — APTT: aPTT: 33 seconds (ref 24–37)

## 2012-08-11 LAB — PROTEIN ELECTROPHORESIS, SERUM
Alpha-2-Globulin: 18.5 % — ABNORMAL HIGH (ref 7.1–11.8)
Beta 2: 5.6 % (ref 3.2–6.5)
Beta Globulin: 5 % (ref 4.7–7.2)
Gamma Globulin: 18.6 % (ref 11.1–18.8)
M-Spike, %: NOT DETECTED g/dL
Total Protein ELP: 6.3 g/dL (ref 6.0–8.3)

## 2012-08-11 LAB — ANCA SCREEN W REFLEX TITER
Atypical p-ANCA Screen: NEGATIVE
c-ANCA Screen: NEGATIVE
p-ANCA Screen: NEGATIVE

## 2012-08-11 LAB — UIFE/LIGHT CHAINS/TP QN, 24-HR UR
Albumin, U: DETECTED
Alpha 1, Urine: DETECTED — AB
Alpha 2, Urine: DETECTED — AB
Beta, Urine: DETECTED — AB
Free Kappa Lt Chains,Ur: 45.6 mg/dL — ABNORMAL HIGH (ref 0.14–2.42)
Free Kappa/Lambda Ratio: 2.96 ratio (ref 2.04–10.37)
Free Lambda Lt Chains,Ur: 15.4 mg/dL — ABNORMAL HIGH (ref 0.02–0.67)
Gamma Globulin, Urine: DETECTED — AB
Total Protein, Urine: 460.8 mg/dL

## 2012-08-11 LAB — HEMOGLOBIN A1C
Hgb A1c MFr Bld: 5.3 %
Mean Plasma Glucose: 105 mg/dL

## 2012-08-11 LAB — CBC
Hemoglobin: 6.9 g/dL — CL (ref 12.0–15.0)
MCH: 26.5 pg (ref 26.0–34.0)
MCHC: 33.5 g/dL (ref 30.0–36.0)
Platelets: 164 10*3/uL (ref 150–400)
RDW: 13.5 % (ref 11.5–15.5)

## 2012-08-11 LAB — RENAL FUNCTION PANEL
Albumin: 2.2 g/dL — ABNORMAL LOW (ref 3.5–5.2)
Calcium: 8.1 mg/dL — ABNORMAL LOW (ref 8.4–10.5)
Creatinine, Ser: 6.51 mg/dL — ABNORMAL HIGH (ref 0.50–1.10)
GFR calc Af Amer: 9 mL/min — ABNORMAL LOW (ref >90)
GFR calc non Af Amer: 8 mL/min — ABNORMAL LOW (ref >90)
Phosphorus: 4.6 mg/dL (ref 2.3–4.6)
Sodium: 141 mEq/L (ref 135–145)

## 2012-08-11 LAB — EXTRACTABLE NUCLEAR ANTIGEN ANTIBODY
ENA SM Ab Ser-aCnc: 6 AU/mL (ref <30)
SSB (La) (ENA) Antibody, IgG: 6 AU/mL (ref <30)
Scleroderma (Scl-70) (ENA) Antibody, IgG: <1 AU/mL (ref <30)
Sm/rnp: 33 AU/mL — ABNORMAL HIGH (ref <30)
ds DNA Ab: 2 IU/mL (ref <30)

## 2012-08-11 LAB — ABO/RH: ABO/RH(D): O POS

## 2012-08-11 LAB — ANA: Anti Nuclear Antibody(ANA): POSITIVE — AB

## 2012-08-11 LAB — SEDIMENTATION RATE: Sed Rate: 95 mm/hr — ABNORMAL HIGH (ref 0–22)

## 2012-08-11 LAB — C3 COMPLEMENT: C3 Complement: 81 mg/dL — ABNORMAL LOW (ref 90–180)

## 2012-08-11 MED ORDER — LORAZEPAM 2 MG/ML IJ SOLN
1.0000 mg | Freq: Four times a day (QID) | INTRAMUSCULAR | Status: DC
  Administered 2012-08-11 (×3): 1 mg via INTRAMUSCULAR
  Filled 2012-08-11 (×3): qty 1

## 2012-08-11 MED ORDER — HALOPERIDOL 5 MG PO TABS
5.0000 mg | ORAL_TABLET | Freq: Four times a day (QID) | ORAL | Status: DC
  Administered 2012-08-12 – 2012-08-23 (×42): 5 mg via ORAL
  Filled 2012-08-11 (×52): qty 1

## 2012-08-11 MED ORDER — LORAZEPAM 1 MG PO TABS: 1.0000 mg | ORAL_TABLET | ORAL | Status: DC

## 2012-08-11 MED ORDER — LORAZEPAM 1 MG PO TABS
1.0000 mg | ORAL_TABLET | Freq: Four times a day (QID) | ORAL | Status: DC
  Administered 2012-08-12 – 2012-08-23 (×42): 1 mg via ORAL
  Filled 2012-08-11 (×45): qty 1

## 2012-08-11 MED ORDER — LORAZEPAM 2 MG/ML IJ SOLN: 1.0000 mg | INTRAMUSCULAR | Status: DC

## 2012-08-11 MED ORDER — HALOPERIDOL LACTATE 5 MG/ML IJ SOLN
5.0000 mg | Freq: Four times a day (QID) | INTRAMUSCULAR | Status: DC
  Administered 2012-08-11 (×3): 5 mg via INTRAMUSCULAR
  Filled 2012-08-11 (×51): qty 1

## 2012-08-11 NOTE — Progress Notes (Signed)
Patient refuses Ativan and Haldol medications. Spoke with Dr. Birdie Sons in person and again over the phone; it is confirmed by him as well as the psychologist (see Progress Notes) that the patient does not currently have the capacity to make her own decisions.  Spoke with patient again to give her the opportunity to willingly take her medications. Patient refused again. Dr. Birdie Sons and [facility] staff were called to the patient's bedside to supervise administration of IM injections of Haldol and Ativan. Patient did not require physical restraint at any time during the medication administration.  Will continue to monitor patient closely and will continue to have 1:1 sitter in patient's room.  18:30- Patient again refuses doses of Ativan and Haldol.  Hospital Security staff was called to the patient's bedside to supervise administration of IM injections of Haldol and Ativan. Patient did not require physical restraint at any time during the medication administration.  Will continue to monitor patient closely and will continue to have 1:1 sitter in patient's room.

## 2012-08-11 NOTE — Progress Notes (Signed)
08/11/2012 2:41 PM  Per report from patient RN, patient has refused all but her clonidine this am.  Pt is scheduled to receive IM ativan and haldol per MD order.  Pt adamantly refused multiple times, despite multiple attempts at explanation and education regarding need for the medication.  Per Dr. Birdie Sons and psych MDs, pt lacks capacity to make decisions and refuse medications.  Dr. Birdie Sons paged again to speak with the patient and try to get patient to agree to take medications.  After a lengthy discussion with the patient, the patient still refused the medication, and orders were given per Dr. Birdie Sons to administer IM medications to patient against her wishes with the help of security.  Pt surprisingly took the medications without any issues, however, became verbally abusive and manipulative immediately following the med administration, including using profanity and "dismissing" [place] from her room.  Pt also wished to have the door closed, to which I responded no due to the fact that she was on suicide precautions.  She was not happy about this either. Eunice Blase

## 2012-08-11 NOTE — Progress Notes (Signed)
FMTS Interval progress note  Went to discuss medications to be given for psych issues, specifically haldol and ativan. Discussed with psychiatrist that saw the patient yesterday if we could give these against her will and he stated that the patient has been deemed to not have capacity to make medical decisions at this time and two psychiatrists have agreed on this. It was his determination that we could give her haldol 5 mg IM q6 hours and ativan 1 mg q6 hours until patient's psychosis is well controlled. Discussed all this with the patient and she stated that she did not want to take the "psychotropic" medications because she didn't know what these could do to her kidneys and that she had been treated as a [place] pig in the past. When I explained that we would not give her something that would purposefully hurt her kidney, she stated that she had been given lithium and that myself and her had discussed that this could have been a potential cause of her kidney injury at her first outpatient visit. When I explained that in discussions with Dr. Briant Cedar, the nephrologist, we felt given the timeline this kidney issue was not likely due to the lithium. She then accused me of switching sides. Then went back to talking about not wanting to take the "psychotropic" medications. I then informed her that the two psychiatrists that have seen her the past two days have deemed that she does not have the capacity to refuse these medications due to her current mental state. She continued to say that she would not take these medications regardless of our recommendations. When I informed her that we were going to give her the medications because she had been deemed to not have capacity to refuse, she stated that I could not be her doctor anymore and she dismissed me from the room.  The patient was subsequently given Haldol 5 mg IM and ativan 1 mg IM by the nursing staff, with security present. The patient did not resist these  injects.  Marikay Alar, MD PGY1 FMTS

## 2012-08-11 NOTE — Progress Notes (Addendum)
S: She is resting, no new CO O:BP 170/110  Pulse 71  Temp(Src) 97.7 F (36.5 C) (Oral)  Resp 20  Ht 5\' 7"  (1.702 m)  Wt 106.3 kg (234 lb 5.6 oz)  BMI 36.7 kg/m2  SpO2 100%  Intake/Output Summary (Last 24 hours) at 08/11/12 1400 Last data filed at 08/11/12 0943  Gross per 24 hour  Intake    940 ml  Output   1150 ml  Net   -210 ml   Weight change: 0.001 kg (0 oz) JXB:JYNWG and alert CVS:RRR Resp:clear Abd:+BS NTND Ext:0-tr edema NEURO:CNI no asterixis   . amLODipine  5 mg Oral Daily  . darbepoetin (ARANESP) injection - NON-DIALYSIS  100 mcg Subcutaneous Q Mon-1800  . haloperidol  5 mg Oral Q6H   Or  . haloperidol lactate  5 mg Intramuscular Q6H  . heparin  5,000 Units Subcutaneous Q8H  . LORazepam  1 mg Oral Q6H   Or  . LORazepam  1 mg Intramuscular Q6H  . nicotine  21 mg Transdermal Daily   No results found. BMET    Component Value Date/Time   NA 141 08/11/2012 0500   K 3.4* 08/11/2012 0500   CL 109 08/11/2012 0500   CO2 22 08/11/2012 0500   GLUCOSE 91 08/11/2012 0500   BUN 29* 08/11/2012 0500   CREATININE 6.51* 08/11/2012 0500   CREATININE 4.37* 07/30/2012 1544   CALCIUM 8.1* 08/11/2012 0500   GFRNONAA 8* 08/11/2012 0500   GFRAA 9* 08/11/2012 0500   CBC    Component Value Date/Time   WBC 3.7* 08/11/2012 0500   RBC 2.60* 08/11/2012 0500   HGB 6.9* 08/11/2012 0500   HCT 20.6* 08/11/2012 0500   PLT 164 08/11/2012 0500   MCV 79.2 08/11/2012 0500   MCH 26.5 08/11/2012 0500   MCHC 33.5 08/11/2012 0500   RDW 13.5 08/11/2012 0500   LYMPHSABS 2.2 08/08/2012 1610   MONOABS 0.3 08/08/2012 1610   EOSABS 0.0 08/08/2012 1610   BASOSABS 0.0 08/08/2012 1610     Assessment: 1. Subacute renal failure with nephrotic syndrome.  Her ANA is pos in 1:320 but speckled pattern.  (I ordered further testing yest).  Her C3 is low.  This all would fit with lupus if confirmatory testing is + 2. HTN 3. Acute psychosis 4. Anemia ? If related to lupus Plan: 1. Await rest of Lupus WU 2.  She needs renal bx when her  psychosis improves.  She is agreeable to proceeding, at least at this time   3.  If this is Lupus, ? 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 Follow renal fx 4. In light of proceeding with renal bx, I would rec transfusion of 2 units PRBC's Sara [name]

## 2012-08-11 NOTE — Progress Notes (Signed)
CRITICAL VALUE ALERT  Critical value received:  Hemoglobin=6.9  Date of notification:  08/11/12  Time of notification:  0600  Critical value read back:yes  Nurse who received alert:  Aarian Cleaver   MD notified (1st page):  FMTS ([phone])  Time of first page:  0612  MD notified (2nd page): N/A  Time of second page: N/A  Responding MD: FMTS  Time MD responded:  2130

## 2012-08-11 NOTE — Progress Notes (Signed)
FMTS Attending Admission Note: Sara Forquer,MD I  have seen and examined this patient, reviewed their chart. I have discussed this patient with the resident. I agree with the resident's findings, assessment and care plan.  Patient this morning denies any concern,she will like to go home. O/E: Not in distress,some facial puffiness and hand puffiness,no facial swelling. CV,Pulm and GI exam benign. A/P:         Renal failure: Nephrology f/u,awaiting biopsy and test result,avoid NSAID.        Psychosis: Currently stable,continue prn haldol and Ativan as suggested by psych,but patient will need a long acting medication,we will contact psych to start this.        Anemia: H/H gradually dropping,unclear if bleeding from any source,stool guaiac suggested which will be done today,check with renal if blood transfusion as appropriate at this time,our team is more inclined to transfusing her today.

## 2012-08-11 NOTE — Progress Notes (Signed)
Patient ID: Sara [name], female   DOB: February 03, 1981, 32 y.o.   MRN: [id number]  Case discussed with Dr. Birdie Sons regarding forced medication for this patient. Patient has been dangerous to herself and others while not receiving medication management, and known non compliance with her medication,  I agree with forced medication management. Patient should receive haldol and ativan IM medication as scheduled for controlling her irritability, agitation, aggression, mania and psychosis.

## 2012-08-11 NOTE — Progress Notes (Signed)
Family Medicine Teaching Service Daily Progress Note Service Page: [phone]  Subjective: Doing ok this morning. States doesn't understand why we are trying to give her medications that aren't helping her. Still asking when she gets to go home.  Objective: Temp:  [97.8 F (36.6 C)-98.6 F (37 C)] 98.2 F (36.8 C) (05/07 0618) Pulse Rate:  [87-96] 96 (05/07 0618) Resp:  [18-24] 24 (05/07 0618) BP: (152-168)/(102-112) 158/107 mmHg (05/07 0618) SpO2:  [98 %-100 %] 98 % (05/07 0618) Weight:  [234 lb 5.6 oz (106.3 kg)] 234 lb 5.6 oz (106.3 kg) (05/06 2146) Exam: General: NAD, laying comfortably in bed Cardiovascular: rrr, no mrg Respiratory: CTAB, no wheezes or crackles Extremities: trace edema Psych: tangential thoughts, pressured speech, difficult to redirect  CBC BMET   Recent Labs Lab 08/09/12 0308 08/10/12 0857 08/11/12 0500  WBC 6.7 4.3 3.7*  HGB 7.5* 7.1* 6.9*  HCT 23.3* 21.3* 20.6*  PLT 212 179 164    Recent Labs Lab 08/09/12 0308 08/10/12 1025 08/11/12 0500  NA 140 141 141  K 3.8 3.6 3.4*  CL 107 108 109  CO2 20 23 22   BUN 37* 34* 29*  CREATININE 6.73* 6.68* 6.51*  GLUCOSE 70 93 91  CALCIUM 8.7 8.5 8.1*     Results for orders placed during the hospital encounter of 08/08/12 (from the past 48 hour(s))  HIV ANTIBODY (ROUTINE TESTING)     Status: None   Collection Time    08/09/12  9:36 AM      Result Value Range   HIV NON REACTIVE  NON REACTIVE  CBC     Status: Abnormal   Collection Time    08/10/12  8:57 AM      Result Value Range   WBC 4.3  4.0 - 10.5 K/uL   RBC 2.66 (*) 3.87 - 5.11 MIL/uL   Hemoglobin 7.1 (*) 12.0 - 15.0 g/dL   HCT 45.4 (*) 09.8 - 11.9 %   MCV 80.1  78.0 - 100.0 fL   MCH 26.7  26.0 - 34.0 pg   MCHC 33.3  30.0 - 36.0 g/dL   RDW 14.7  82.9 - 56.2 %   Platelets 179  150 - 400 K/uL  RENAL FUNCTION PANEL     Status: Abnormal   Collection Time    08/10/12 10:25 AM      Result Value Range   Sodium 141  135 - 145 mEq/L   Potassium  3.6  3.5 - 5.1 mEq/L   Chloride 108  96 - 112 mEq/L   CO2 23  19 - 32 mEq/L   Glucose, Bld 93  70 - 99 mg/dL   BUN 34 (*) 6 - 23 mg/dL   Creatinine, Ser 1.30 (*) 0.50 - 1.10 mg/dL   Calcium 8.5  8.4 - 86.5 mg/dL   Phosphorus 4.8 (*) 2.3 - 4.6 mg/dL   Albumin 2.3 (*) 3.5 - 5.2 g/dL   GFR calc non Af Amer 7 (*) >90 mL/min   GFR calc Af Amer 9 (*) >90 mL/min   Comment:            The eGFR has been calculated     using the CKD EPI equation.     This calculation has not been     validated in all clinical     situations.     eGFR's persistently     <90 mL/min signify     possible Chronic Kidney Disease.  RENAL FUNCTION PANEL  Status: Abnormal   Collection Time    08/11/12  5:00 AM      Result Value Range   Sodium 141  135 - 145 mEq/L   Potassium 3.4 (*) 3.5 - 5.1 mEq/L   Chloride 109  96 - 112 mEq/L   CO2 22  19 - 32 mEq/L   Glucose, Bld 91  70 - 99 mg/dL   BUN 29 (*) 6 - 23 mg/dL   Creatinine, Ser 0.10 (*) 0.50 - 1.10 mg/dL   Calcium 8.1 (*) 8.4 - 10.5 mg/dL   Phosphorus 4.6  2.3 - 4.6 mg/dL   Albumin 2.2 (*) 3.5 - 5.2 g/dL   GFR calc non Af Amer 8 (*) >90 mL/min   GFR calc Af Amer 9 (*) >90 mL/min   Comment:            The eGFR has been calculated     using the CKD EPI equation.     This calculation has not been     validated in all clinical     situations.     eGFR's persistently     <90 mL/min signify     possible Chronic Kidney Disease.  CBC     Status: Abnormal   Collection Time    08/11/12  5:00 AM      Result Value Range   WBC 3.7 (*) 4.0 - 10.5 K/uL   RBC 2.60 (*) 3.87 - 5.11 MIL/uL   Hemoglobin 6.9 (*) 12.0 - 15.0 g/dL   Comment: CRITICAL RESULT CALLED TO, READ BACK BY AND VERIFIED WITH:     BROWN R RN 08/11/12 0555 COSTELLO B   HCT 20.6 (*) 36.0 - 46.0 %   MCV 79.2  78.0 - 100.0 fL   MCH 26.5  26.0 - 34.0 pg   MCHC 33.5  30.0 - 36.0 g/dL   RDW 27.2  53.6 - 64.4 %   Platelets 164  150 - 400 K/uL  SEDIMENTATION RATE     Status: Abnormal   Collection  Time    08/11/12  5:00 AM      Result Value Range   Sed Rate 95 (*) 0 - 22 mm/hr   US Renal  08/09/2012  *RADIOLOGY REPORT*  Clinical Data: Rapid progression of renal failure  RENAL/URINARY TRACT ULTRASOUND COMPLETE  Comparison:  None.  Findings:  Right Kidney:  Markedly increased renal parenchymal echogenicity. No hydronephrosis or solid renal lesion.  12.7 cm in length.  Left Kidney:  Markedly increased renal parenchymal echogenicity. No hydronephrosis or solid renal lesion.  12.1 cm in length.  Bladder: The bladder is under distended.  IMPRESSION:  Marked renal parenchymal echogenicity suggest underlying medical renal disease.  No hydronephrosis.   Original Report Authenticated By: Malachy Moan, M.D.      Assessment/Plan: Sara [name] is a 32 y.o. 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   1. Renal: patient with rapidly progressing renal decline. Has had increase in Cr to 6.92 from 1.22 in just over 2 months in addition to proteinuria, hematuria, and edema. 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 SLE now a concern as patient has positive ANA. -will c/s renal-appreciate their recommendations-appears that this may be related to lupus given positive ANA, awaiting further labs to return and if positive  for lupus will likely start treatment  -f/u SPEP  -urine protein/Cr ratio elevated to 4.55, urine electrophoresis with elevated lambda and kappa -C3 low, C4 normal, ANCA negative, ANA positive -will obtain DS DNA, antiglomerular Ab, extractable nuclear antigen -renal US with medical renal disease -UA with >300 protein, moderate Hgb, micro granular casts -Cr slightly improved on am CMET-will continue to follow  -will discontinue ACE inhibitor  given elevation in Cr-patient never started this  2. Psych: patient initially brought to ED for psych issues. Was initially irate and difficult to control, though following administration of geodon and ativan became more reasonable. Has had several episodes of outbursts and irate behavior, though has been refusing any medications.  -spoke with psych, appreciate their help, they have advised that we can administer medications without patients consent due to not having capacity-2 MDs have agreed on this  -will administer IM haldol 5 mg and IM ativan 1 mg -sitter at bedside   3. CV: with recent diagnosis of HTN. Recently started on ACE for HTN and renal benefit.  -will stop ACE given worsening renal function  -vitals per unit protocol  -start norvasc 5 mg daily for BP control -hydralazine 10 mg prn for BP >160/110  -will monitor vitals   4. Heme: patient with hgb 7.4, down from 8.8 one week ago, though baseline appears to be somewhere between 7.4 and 8.8 with 2 additional readings of 7.6 and 8.4 in the past 2 months. Likely related to renal cause. No complaints of blood loss at this time to indicate source of anemia.  -will monitor on CBC  -anemia panel indicates anemia of chronic disease  -started on aranesp  -will check FOBT-denies source at this time -will plan on transfusion given Hgb 6.9 if we can obtain IV access  FEN/GI: heart healthy diet  Prophylaxis: SQ heparin  Disposition: patient is under IVC and awaiting medical clearance prior to going to [facility]  Code Status: full   Marikay Alar, MD 08/11/2012, 7:31 AM

## 2012-08-12 ENCOUNTER — Encounter (HOSPITAL_COMMUNITY): Payer: Self-pay | Admitting: Radiology

## 2012-08-12 ENCOUNTER — Inpatient Hospital Stay (HOSPITAL_COMMUNITY): Payer: Medicare HMO

## 2012-08-12 LAB — RENAL FUNCTION PANEL
Albumin: 2.2 g/dL — ABNORMAL LOW (ref 3.5–5.2)
BUN: 28 mg/dL — ABNORMAL HIGH (ref 6–23)
Chloride: 111 mEq/L (ref 96–112)
GFR calc Af Amer: 9 mL/min — ABNORMAL LOW (ref >90)
Potassium: 3.7 mEq/L (ref 3.5–5.1)
Sodium: 143 mEq/L (ref 135–145)

## 2012-08-12 LAB — CBC
HCT: 25 % — ABNORMAL LOW (ref 36.0–46.0)
MCV: 79.4 fL (ref 78.0–100.0)
Platelets: 183 10*3/uL (ref 150–400)
RBC: 3.15 MIL/uL — ABNORMAL LOW (ref 3.87–5.11)
WBC: 5.7 10*3/uL (ref 4.0–10.5)

## 2012-08-12 LAB — TYPE AND SCREEN
ABO/RH(D): O POS
Unit division: 0
Unit division: 0

## 2012-08-12 LAB — GLOMERULAR BASEMENT MEMBRANE ANTIBODIES: GBM Ab: <1 AU/mL (ref <20)

## 2012-08-12 MED ORDER — HYDRALAZINE HCL 20 MG/ML IJ SOLN
INTRAMUSCULAR | Status: AC | PRN
  Administered 2012-08-12 (×2): 10 mg via INTRAVENOUS

## 2012-08-12 MED ORDER — FENTANYL CITRATE 0.05 MG/ML IJ SOLN
INTRAMUSCULAR | Status: AC | PRN
  Administered 2012-08-12 (×2): 50 ug via INTRAVENOUS

## 2012-08-12 MED ORDER — MIDAZOLAM HCL 2 MG/2ML IJ SOLN
INTRAMUSCULAR | Status: AC | PRN
  Administered 2012-08-12 (×2): 1 mg via INTRAVENOUS

## 2012-08-12 NOTE — H&P (Signed)
Agree 
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
## 2012-08-12 NOTE — Progress Notes (Signed)
Utilization review completed.  

## 2012-08-12 NOTE — Progress Notes (Signed)
FMTS Attending Admission Note: Kaizley Aja,MD I  have seen and examined this patient, reviewed their chart. I have discussed this patient with the resident. I agree with the resident's findings, assessment and care plan.  

## 2012-08-12 NOTE — ED Notes (Signed)
Patient is resting comfortably eyes closed with no s/s of pain

## 2012-08-12 NOTE — Procedures (Signed)
Procedure:  Ultrasound guided core biopsy of left kidney Under ultrasound guidance, 16 G core bx x 2 of mid to lower left kidney performed.  No immediate complications.

## 2012-08-12 NOTE — Progress Notes (Signed)
Patient ID: Sara [name], female   DOB: 01/27/1981, 32 y.o.   MRN: [id number] Family Medicine Teaching Service Daily Progress Note Service Page: [phone]  Subjective: Doing ok this morning. Awaiting her renal bx. No complaints.   Objective: BP 166/101  Pulse 75  Temp(Src) 98.3 F (36.8 C) (Oral)  Resp 16  Ht 5\' 7"  (1.702 m)  Wt 234 lb 5.6 oz (106.3 kg)  BMI 36.7 kg/m2  SpO2 96%  Exam: General: NAD, laying comfortably in bed Eyes: Appear more swollen today than prior.  Cardiovascular: rrr, no mrg Respiratory: CTAB, no wheezes or crackles Extremities: trace edema Psych: pressured speech, difficult to redirect   CBC    Component Value Date/Time   WBC 5.7 08/12/2012 0455   RBC 3.15* 08/12/2012 0455   HGB 8.8* 08/12/2012 0455   HCT 25.0* 08/12/2012 0455   PLT 183 08/12/2012 0455   MCV 79.4 08/12/2012 0455   MCH 27.9 08/12/2012 0455   MCHC 35.2 08/12/2012 0455   RDW 13.7 08/12/2012 0455   LYMPHSABS 2.2 08/08/2012 1610   MONOABS 0.3 08/08/2012 1610   EOSABS 0.0 08/08/2012 1610   BASOSABS 0.0 08/08/2012 1610   BMET    Component Value Date/Time   NA 143 08/12/2012 0455   K 3.7 08/12/2012 0455   CL 111 08/12/2012 0455   CO2 19 08/12/2012 0455   GLUCOSE 87 08/12/2012 0455   BUN 28* 08/12/2012 0455   CREATININE 6.30* 08/12/2012 0455   CREATININE 4.37* 07/30/2012 1544   CALCIUM 8.3* 08/12/2012 0455   GFRNONAA 8* 08/12/2012 0455   GFRAA 9* 08/12/2012 0455      US Renal 08/09/2012  *RADIOLOGY REPORT*  Clinical Data: Rapid progression of renal failure  RENAL/URINARY TRACT ULTRASOUND COMPLETE  Comparison:  None.  Findings:  Right Kidney:  Markedly increased renal parenchymal echogenicity. No hydronephrosis or solid renal lesion.  12.7 cm in length.  Left Kidney:  Markedly increased renal parenchymal echogenicity. No hydronephrosis or solid renal lesion.  12.1 cm in length.  Bladder: The bladder is under distended.  IMPRESSION:  Marked renal parenchymal echogenicity suggest underlying medical renal disease.  No  hydronephrosis.   Original Report Authenticated By: Malachy Moan, M.D.      Assessment/Plan: Sara [name] is a 32 y.o. 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   Renal: patient with rapidly progressing renal decline. Has had increase in Cr to 6.92 from 1.22 in just over 2 months in addition to proteinuria, hematuria, and edema. 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 Subacute renal failure with nephrotic syndrome. + ANA and + Smith but - anti DNA. ? Lupus. Bx today -will c/s renal-appreciate their recommendations-appears that this may be related to lupus given positive ANA, awaiting further labs to return and if positive for lupus will likely start treatment  -f/u SPEP  -urine protein/Cr ratio elevated to 4.55, urine electrophoresis with elevated lambda and kappa -C3 low, C4 normal, ANCA negative, ANA positive -will obtain DS DNA, antiglomerular Ab, extractable nuclear antigen -renal US with medical renal disease -UA with >300 protein, moderate Hgb, micro granular casts -Cr slightly improved on am CMET-will continue to follow  -will discontinue ACE inhibitor given elevation in Cr-patient never started this    Psych: patient initially brought to ED for psych issues. Was initially  irate and difficult to control, though following administration of geodon and ativan became more reasonable. Has had several episodes of outbursts and irate behavior, though has been refusing any medications.  -spoke with psych, appreciate their help, they have advised that we can administer medications without patients consent due to not having capacity-2 MDs have agreed on this  -will administer IM haldol 5 mg and IM ativan 1 mg--> Mood slightly better today   -sitter at bedside   CV: with recent diagnosis of HTN. Recently started on ACE for HTN and renal benefit.  -will stop ACE given worsening renal function  -vitals per unit protocol  -start norvasc 5 mg daily for BP control -hydralazine 10 mg prn for BP >160/110  -will monitor vitals   Heme: patient with hgb 7.4, down from 8.8 one week ago, though baseline appears to be somewhere between 7.4 and 8.8 with 2 additional readings of 7.6 and 8.4 in the past 2 months. Likely related to renal cause. No complaints of blood loss at this time to indicate source of anemia.  -will monitor on CBC  -anemia panel indicates anemia of chronic disease  -started on aranesp  -will check FOBT-denies source at this time   FEN/GI: heart healthy diet --> has been NPO overnight for procedure Prophylaxis: SQ heparin  Disposition: patient is under IVC and awaiting medical clearance prior to going to [facility]  Code Status: full  Balthazar Dooly

## 2012-08-12 NOTE — ED Notes (Signed)
Patient denies pain and is resting comfortably.  

## 2012-08-12 NOTE — Progress Notes (Signed)
S: No uremic Sxs.  She knows the plan is for renal bx today and is still agreeable to have it done O:BP 166/101  Pulse 75  Temp(Src) 98.3 F (36.8 C) (Oral)  Resp 16  Ht 5\' 7"  (1.702 m)  Wt 106.3 kg (234 lb 5.6 oz)  BMI 36.7 kg/m2  SpO2 96%  Intake/Output Summary (Last 24 hours) at 08/12/12 0906 Last data filed at 08/12/12 1191  Gross per 24 hour  Intake  957.5 ml  Output   1800 ml  Net -842.5 ml   Weight change:  YNW:GNFAO and alert CVS:RRR Resp:clear Abd:+BS NTND Ext:0 edema NEURO:CNI no asterixis   . amLODipine  5 mg Oral Daily  . darbepoetin (ARANESP) injection - NON-DIALYSIS  100 mcg Subcutaneous Q Mon-1800  . haloperidol  5 mg Oral Q6H   Or  . haloperidol lactate  5 mg Intramuscular Q6H  . heparin  5,000 Units Subcutaneous Q8H  . LORazepam  1 mg Oral Q6H   Or  . LORazepam  1 mg Intramuscular Q6H  . nicotine  21 mg Transdermal Daily   No results found. BMET    Component Value Date/Time   NA 143 08/12/2012 0455   K 3.7 08/12/2012 0455   CL 111 08/12/2012 0455   CO2 19 08/12/2012 0455   GLUCOSE 87 08/12/2012 0455   BUN 28* 08/12/2012 0455   CREATININE 6.30* 08/12/2012 0455   CREATININE 4.37* 07/30/2012 1544   CALCIUM 8.3* 08/12/2012 0455   GFRNONAA 8* 08/12/2012 0455   GFRAA 9* 08/12/2012 0455   CBC    Component Value Date/Time   WBC 5.7 08/12/2012 0455   RBC 3.15* 08/12/2012 0455   HGB 8.8* 08/12/2012 0455   HCT 25.0* 08/12/2012 0455   PLT 183 08/12/2012 0455   MCV 79.4 08/12/2012 0455   MCH 27.9 08/12/2012 0455   MCHC 35.2 08/12/2012 0455   RDW 13.7 08/12/2012 0455   LYMPHSABS 2.2 08/08/2012 1610   MONOABS 0.3 08/08/2012 1610   EOSABS 0.0 08/08/2012 1610   BASOSABS 0.0 08/08/2012 1610     Assessment: 1. Subacute renal failure with nephrotic syndrome.  + ANA and + Smith but - anti DNA.  ? Lupus.  Bx will be very helpful 2. HTN 3. Acute psychosis, she seem more sedate 4. Anemia ? If related to lupus  SP transfusion.  On Aranesp Plan: 1. BX today.  It will be a couple of days before  I have the results.  Fortunately, renal fx stable and she has no uremic Sxs   Akash Winski T

## 2012-08-13 DIAGNOSIS — I10 Essential (primary) hypertension: Secondary | ICD-10-CM

## 2012-08-13 LAB — CBC
HCT: 27.8 % — ABNORMAL LOW (ref 36.0–46.0)
MCHC: 33.8 g/dL (ref 30.0–36.0)
Platelets: 196 10*3/uL (ref 150–400)
RDW: 13.6 % (ref 11.5–15.5)
WBC: 6.1 10*3/uL (ref 4.0–10.5)

## 2012-08-13 LAB — BASIC METABOLIC PANEL
BUN: 28 mg/dL — ABNORMAL HIGH (ref 6–23)
Chloride: 110 mEq/L (ref 96–112)
GFR calc Af Amer: 9 mL/min — ABNORMAL LOW (ref >90)
GFR calc non Af Amer: 8 mL/min — ABNORMAL LOW (ref >90)
Potassium: 3.5 mEq/L (ref 3.5–5.1)
Sodium: 143 mEq/L (ref 135–145)

## 2012-08-13 MED ORDER — ZIPRASIDONE MESYLATE 20 MG IM SOLR
20.0000 mg | Freq: Once | INTRAMUSCULAR | Status: DC
  Filled 2012-08-13: qty 20

## 2012-08-13 MED ORDER — ZIPRASIDONE MESYLATE 20 MG IM SOLR
20.0000 mg | Freq: Once | INTRAMUSCULAR | Status: AC | PRN
  Filled 2012-08-13: qty 20

## 2012-08-13 MED ORDER — HYDRALAZINE HCL 10 MG PO TABS
10.0000 mg | ORAL_TABLET | Freq: Three times a day (TID) | ORAL | Status: DC | PRN
  Administered 2012-08-13: 10 mg via ORAL
  Filled 2012-08-13 (×2): qty 1

## 2012-08-13 NOTE — Discharge Summary (Signed)
Physician Discharge Summary   Patient ID: Sara [name] MRN: [id number] DOB/AGE: 04-12-1980 32 y.o.  Admit date: 08/08/2012 Discharge date: 08/23/2012  Admission Diagnoses: Psychosis  Discharge Diagnoses:  Principal Problem:   Psychosis Active Problems:   HTN (hypertension)   Elevated serum creatinine   Mania   Positive ANA (antinuclear antibody)   Positive Smith antibody   Lupus nephritis   Discharged Condition: Good  Hospital Course:  Sara [name] is a 32 y.o. year old female with long psych history including multiple IVC for psychosis behavior, HTN, proteinuria, and recent rapid elevation in Cr presenting as transfer from [facility] ED for evaluation of renal function prior to [facility] placement.  IVC papers taken out by family and neighbors for erratic behavior. She was reportedly seen talking to a racoon, and per report from police on arrival to [facility] patient was found to have a knife. In the [facility] ED the patient was found to be "irrate, yelling, and out of control in triage." She was given IM geodon and IM ativan and per report, and appeared to calm down. She was transferred to [facility] for evaluation of her renal failure.  Notably patient has had a precipitous increase in her Cr from 1.22 06/04/12 to 6.92 on admission. Additionally noted to have BUN of 37 that is increased from 19 on 07/30/12. Also noted to have >300 protein and large Hgb on UA on 06/04/12. Other notable labs include Hgb 7.4 down from 8.8 one month ago. Normal tylenol, salicylate, and alcohol levels.   Subacute renal failure with nephrotic syndrome. ANCA negative, + ANA and + Smith but - anti DNA. Urine protein/Cr ratio, urine electrophoresis with elevated lambda and kappa. C3 low, C4 normal.Renal US with medical renal disease. UA with >300 protein, moderate Hgb, micro granular casts Creatinine continued to rise to max >9 but stabilized prior to discharge 6.87. 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 The treatment plan consists of high dose steroids (solumedrol/prednisone)  and  Cellcept. She received 2 doses of solumedrol as inpatient, refused the thrid. And then transitioned to Prednisone 40mg  BID. Pt able to produce urine throughout hospitalization.  Psych: IVC. patient initially brought to ED for psych issues. Was irate and difficult to control, though following administration of geodon and ativan became more reasonable. Has had several episodes of outbursts and irate behavior, though has been refusing any medications; tried to leave [facility] 5/9, but was calmed down and stayed. Psych consulted and  advised that patient did not have capacity and we can administer medications without consent. Haldol 5 mg and IM ativan 1 mg were scheduled, per psych recommendations. Sitter remained at bedside. On day of discharge patient was seen by psych and deemed to have capacity and did not meet criteria for inpatient psych bed. IVC papers at that time were expired. Per psych, team did not need to renew and patient could be discharged home with close follow up. Patient's mother is to help with compliance issues. Haldol 5 mg was continued, as scheduled, for outpatient setting. Patient will need to follow up with outpatient psych.   CV: with recent diagnosis of HTN. Recently started on ACE for HTN and renal benefit. Per renal recommendations, we increased norvasc to 10mg  and add lasix 40 BID on 08/13/12. Hydralazine PO 10 mg prn for BP >160/110. Pt BP stable on AMlodipine 10mg  and lasix BID at time of discharge.  Heme: patient with hgb  7.4 on admission, down from 8.8 one week ago, though baseline appears to be somewhere between 7.4 and 8.8 with 2 additional readings of 7.6 and 8.4 in the past 2 months. Likely related to renal cause. Pt started on aricept during admission, but will follow up with renal outpatient.   Consults: nephrology and  psychiatry  Significant Diagnostic Studies:  US Renal 08/09/2012  *RADIOLOGY REPORT*  Clinical Data: Rapid progression of renal failure  RENAL/URINARY TRACT ULTRASOUND COMPLETE  Comparison:  None.  Findings:  Right Kidney:  Markedly increased renal parenchymal echogenicity. No hydronephrosis or solid renal lesion.  12.7 cm in length.  Left Kidney:  Markedly increased renal parenchymal echogenicity. No hydronephrosis or solid renal lesion.  12.1 cm in length.  Bladder: The bladder is under distended.  IMPRESSION:  Marked renal parenchymal echogenicity suggest underlying medical renal disease.  No hydronephrosis.   Original Report Authenticated By: Malachy Moan, M.D.   US Renal    Recent Labs Lab 08/20/12 0543 08/21/12 0540 08/22/12 0959  HGB 9.6* 9.3* 10.2*  HCT 27.7* 27.3* 29.8*  WBC 11.2* 13.4* 11.4*  PLT 277 281 360   Recent Labs Lab 08/18/12 0605 08/19/12 0514 08/20/12 0543 08/21/12 0540 08/22/12 0959  NA 142 135 134* 137 132*  K 3.4* 3.7 3.8 3.5 4.3  CL 103 99 96 99 96  CO2 26 21 23 24  15*  GLUCOSE 84 137* 118* 90 112*  BUN 32* 36* 48* 58* 63*  CREATININE 8.21* 8.53* 9.05* 8.44* 6.87*  CALCIUM 9.1 9.4 9.5 9.4 9.2   Treatments: IV hydration and procedures: renal bx  Discharge Exam: Blood pressure 148/88, pulse 88, temperature 98 F (36.7 C), temperature source Oral, resp. rate 20, height 5\' 9"  (1.753 m), weight 222 lb 0.1 oz (100.7 kg), SpO2 100.00%. General: NAD, laying comfortably in bed. Answers questions appropriately. Mildly agitated  Cardiovascular: rrr, no murmur, rubs or gallops.  Respiratory: CTAB, nml effort  Extremities: trace edema BLE  Psych: mildly agitated. Wants to go home.   Disposition: Home     Discharge Orders   Future Orders Complete By Expires     Diet - low sodium heart healthy  As directed     Discharge instructions  As directed     Comments:      Be certain to follow up with nephrology and Dr. Serena Croissant within 1 week after discharge  for labs and check up.    Increase activity slowly  As directed         Medication List    STOP taking these medications       benztropine 1 MG tablet  Commonly known as:  COGENTIN     hydrochlorothiazide 25 MG tablet  Commonly known as:  HYDRODIURIL     lisinopril 20 MG tablet  Commonly known as:  PRINIVIL,ZESTRIL     OLANZapine 20 MG tablet  Commonly known as:  ZYPREXA     Oxcarbazepine 300 MG tablet  Commonly known as:  TRILEPTAL      TAKE these medications       amLODipine 10 MG tablet  Commonly known as:  NORVASC  Take 1 tablet (10 mg total) by mouth daily.     calcitRIOL 0.25 MCG capsule  Commonly known as:  ROCALTROL  Take 1 capsule (0.25 mcg total) by mouth daily.     furosemide 40 MG tablet  Commonly known as:  LASIX  Take 1 tablet (40 mg total) by mouth 2 (two) times daily.  haloperidol 5 MG tablet  Commonly known as:  HALDOL  Take 1 tablet (5 mg total) by mouth every 6 (six) hours.     mycophenolate 250 MG capsule  Commonly known as:  CELLCEPT  Take 6 capsules (1,500 mg total) by mouth 2 (two) times daily.     pantoprazole 40 MG tablet  Commonly known as:  PROTONIX  Take 1 tablet (40 mg total) by mouth daily.     predniSONE 20 MG tablet  Commonly known as:  DELTASONE  Take 2 tablets (40 mg total) by mouth 2 (two) times daily with a meal.     sodium bicarbonate 650 MG tablet  Take 1 tablet (650 mg total) by mouth 2 (two) times daily.        * Explained to patient to make an appointment for lab work only, on May 23rd to have CBC and renal function drawn. and then weekly at [facility]  Follow-up Information   Follow up with Marikay Alar, MD. Schedule an appointment as soon as possible for a visit in 1 week.   Contact information:   [address] [place] [address] [phone]       Follow up with Cecille Aver, MD On 09/17/2012. (@10 :30)    Contact information:   [address] [place]  [address] [phone]     Outpatient recommendation: - Behavorial health outpatient referral - CBC and renal function on the 23rd of May and then weekly.  - Continued prednisone, cellcept and sodium bicarb.  - Did not continue aranesp injections.    Signed: Kuneff, Renee 08/23/2012, 7:02 PM

## 2012-08-13 NOTE — Progress Notes (Signed)
Noted patient walking in the hall way out of the room with the sitter stating that she was going home. Escorted patient back to the nurses station. Patient requested to speak with MD. Donnella Sham that the kidney doctor told her that she could go home. Paged and spoke with Dr. Claiborne Billings and made aware of the above and stated she would be up to see patient momentarily. Security called to floor in anticipation of patient escalation. Liat Mayol, Charlyne Quale

## 2012-08-13 NOTE — Progress Notes (Signed)
S: No uremic Sxs.  No gross hematuria no flank pain O:BP 169/108  Pulse 72  Temp(Src) 97.9 F (36.6 C) (Oral)  Resp 18  Ht 5\' 7"  (1.702 m)  Wt 106.3 kg (234 lb 5.6 oz)  BMI 36.7 kg/m2  SpO2 100%  Intake/Output Summary (Last 24 hours) at 08/13/12 1133 Last data filed at 08/13/12 0959  Gross per 24 hour  Intake   1080 ml  Output    650 ml  Net    430 ml   Weight change:  ZOX:WRUEA and alert CVS:RRR Resp:clear Abd:+BS NTND Ext:tr edema NEURO:CNI no asterixis   . amLODipine  5 mg Oral Daily  . darbepoetin (ARANESP) injection - NON-DIALYSIS  100 mcg Subcutaneous Q Mon-1800  . haloperidol  5 mg Oral Q6H   Or  . haloperidol lactate  5 mg Intramuscular Q6H  . heparin  5,000 Units Subcutaneous Q8H  . LORazepam  1 mg Oral Q6H   Or  . LORazepam  1 mg Intramuscular Q6H  . nicotine  21 mg Transdermal Daily   US Biopsy  08/12/2012  *RADIOLOGY REPORT*  Clinical Data: Acute renal failure.  The patient requires renal biopsy.  ULTRASOUND GUIDED CORE BIOPSY OF LEFT KIDNEY  Sedation:  2.0 mg IV Versed;  100 mcg IV Fentanyl  Total Moderate Sedation Time: 10 minutes.  Procedure:  The procedure, risks, benefits, and alternatives were explained to the patient.  Questions regarding the procedure were encouraged and answered.  The patient understands and consents to the procedure.  The left flank region was prepped with Betadine in a sterile fashion, and a sterile drape was applied covering the operative field.  A sterile gown and sterile gloves were used for the procedure. Local anesthesia was provided with 1% Lidocaine.  Both kidneys were examined by ultrasound in a prone position.  The left was chosen for biopsy.  Under ultrasound guidance, a 16 gauge core biopsy device was utilized to obtain two samples of the left kidney.  Material was submitted in saline.  Complications: None  Findings: Both kidneys are very echogenic and indistinct in appearance by ultrasound.  The left kidney was more visible  than the right and was chosen for biopsy.  Posterior cortical samples were obtained at the level of the mid to lower kidney.  IMPRESSION: Ultrasound guided core biopsy performed of the left kidney.   Original Report Authenticated By: Irish Lack, M.D.    BMET    Component Value Date/Time   NA 143 08/13/2012 0835   K 3.5 08/13/2012 0835   CL 110 08/13/2012 0835   CO2 23 08/13/2012 0835   GLUCOSE 102* 08/13/2012 0835   BUN 28* 08/13/2012 0835   CREATININE 6.50* 08/13/2012 0835   CREATININE 4.37* 07/30/2012 1544   CALCIUM 8.9 08/13/2012 0835   GFRNONAA 8* 08/13/2012 0835   GFRAA 9* 08/13/2012 0835   CBC    Component Value Date/Time   WBC 6.1 08/13/2012 0635   RBC 3.43* 08/13/2012 0635   HGB 9.4* 08/13/2012 0635   HCT 27.8* 08/13/2012 0635   PLT 196 08/13/2012 0635   MCV 81.0 08/13/2012 0635   MCH 27.4 08/13/2012 0635   MCHC 33.8 08/13/2012 0635   RDW 13.6 08/13/2012 0635   LYMPHSABS 2.2 08/08/2012 1610   MONOABS 0.3 08/08/2012 1610   EOSABS 0.0 08/08/2012 1610   BASOSABS 0.0 08/08/2012 1610     Assessment: 1. Subacute renal failure with nephrotic syndrome.  + ANA and + Smith but - anti DNA.  ?  Lupus.  Bx will be very helpful 2. HTN 3. Acute psychosis, she is more rational and pleasant 4. Anemia ? If related to lupus  SP transfusion.  On Aranesp Plan: 1. BP high so I would recommend increasing amlodipine to 10mg /d and adding lasix 40mg  BID 2.  She is not stable medically to go to behavioral health as we need the biopsy results to decide on a treatment plan and probably won't have those results til Mon 3.  She says she has family around and if they are willing to be responsible for her I would not be against a day pass for the weekend  Hanaa Payes T

## 2012-08-13 NOTE — Progress Notes (Signed)
FMTS Attending Admission Note: Sara Ohmer,MD I  have seen and examined this patient, reviewed their chart. I have discussed this patient with the resident. I agree with the resident's findings, assessment and care plan.  Patient seem to be doing well,creatinine level increased a little s/p kidney biopsy yesterday,no flank pain,no hematuria. Vital stable except for BP which remains elevated. Physical exam same as yesterday. A/P: 1. Renal dx: S/P biopsy,awaiting biopsy report.     F/U test result,monitor Cr level,avoid nephrotoxic drug.     Continue nephrology f/u. 2. Psychosis: Stable on Haldol and Ativan prn.     Continue 1:1 observation with a sitter at bedside.     Not yet cleared by nephrologist to be transferred to behavioral health. 3. Increase Norvasc to 10 mg qd.     Adding Lasix might worsen Kidney function,unless nephrologist is not concern about this may start Lasix as well.     Continue Hydralazine prn. 4. Anemia: H/H stable,no acute blood loss.

## 2012-08-13 NOTE — Progress Notes (Signed)
Sara [name] 32 y.o.  Paged to the room ~3:30 this afternoon do to patient's escalating mood, and her leaving the room, going to Nurse's desk. She was agitated because she understood, from her conversation with the nephrologist, that she should get to go home today. I had spoke with Dr. Briant Cedar just prior and he reported she is to stay until at least Monday, when he hopes he will have more definitive answers from her lab work and bx. He did mention a "possible" one day pass, if she has reliable family to return her in 24 hours.   I explained this information to the patient, and she became angry and more agitated, stating the kidney doctor "lied to her face." She then threatened to legally sue this provider and hospital for keepin gher against her will. She then proceeded to call 911, to report the team and state she was being held against her will. [place] police and security was present during her event. She had received haldol and ativan ~2 hours prior to event. Due to her  Mood, a one time dose of 20 mg Geodon was ordered since this was effective for her in  The ED, but not used. 5 mg Haldol with 1 mg ativan, was order but not needed d/t to patient eventually calming down.   I will leave the decision up to the weekend team to decide if family and patient are reliable enough to give a day pass. My personal opinion is she would not return, at least not without a fight and police escort, like her admission. Of note, her IVC papers will be at 7 day mark on Sunday and will need renewed. Will make psych SW aware.  Felix Pacini

## 2012-08-13 NOTE — Progress Notes (Signed)
Patient ID: Sara [name], female   DOB: [birth year], 32 y.o.   MRN: [id number] Family Medicine Teaching Service Daily Progress Note Service Page: [phone]  Subjective: Doing ok this morning.  No complaints. Wants to go "home."  Objective: BP 166/93  Pulse 93  Temp(Src) 98.4 F (36.9 C) (Oral)  Resp 18  Ht 5\' 7"  (1.702 m)  Wt 234 lb 5.6 oz (106.3 kg)  BMI 36.7 kg/m2  SpO2 93%  Exam: General: NAD, laying comfortably in bed. Cardiovascular: rrr, no mrg Respiratory: CTAB, no wheezes or crackles Extremities: trace edema Psych: pressured speech   CBC    Component Value Date/Time   WBC 6.1 08/13/2012 0635   RBC 3.43* 08/13/2012 0635   HGB 9.4* 08/13/2012 0635   HCT 27.8* 08/13/2012 0635   PLT 196 08/13/2012 0635   MCV 81.0 08/13/2012 0635   MCH 27.4 08/13/2012 0635   MCHC 33.8 08/13/2012 0635   RDW 13.6 08/13/2012 0635   LYMPHSABS 2.2 08/08/2012 1610   MONOABS 0.3 08/08/2012 1610   EOSABS 0.0 08/08/2012 1610   BASOSABS 0.0 08/08/2012 1610   BMET    Component Value Date/Time   NA 143 08/13/2012 0835   K 3.5 08/13/2012 0835   CL 110 08/13/2012 0835   CO2 23 08/13/2012 0835   GLUCOSE 102* 08/13/2012 0835   BUN 28* 08/13/2012 0835   CREATININE 6.50* 08/13/2012 0835   CREATININE 4.37* 07/30/2012 1544   CALCIUM 8.9 08/13/2012 0835   GFRNONAA 8* 08/13/2012 0835   GFRAA 9* 08/13/2012 0835      US Renal 08/09/2012  *RADIOLOGY REPORT*  Clinical Data: Rapid progression of renal failure  RENAL/URINARY TRACT ULTRASOUND COMPLETE  Comparison:  None.  Findings:  Right Kidney:  Markedly increased renal parenchymal echogenicity. No hydronephrosis or solid renal lesion.  12.7 cm in length.  Left Kidney:  Markedly increased renal parenchymal echogenicity. No hydronephrosis or solid renal lesion.  12.1 cm in length.  Bladder: The bladder is under distended.  IMPRESSION:  Marked renal parenchymal echogenicity suggest underlying medical renal disease.  No hydronephrosis.   Original Report Authenticated By: Sara [name], M.D.       Assessment/Plan: Sara [name] is a 32 y.o. year old female with long psych history including multiple IVC for psychosis behavior, HTN, proteinuria, and recent rapid elevation in Cr presenting as transfer from [facility] ED for evaluation of renal function prior to [facility] placement. Dr. Hanley [name] has seen patient and is in the process of working her up for autoimmune causes to her acute kidney failure; awaiting further labs to return and if positive for lupus will likely start treatment    Renal: patient with rapidly progressing renal decline. Has had increase in Cr to 6.92 from 1.22 in just over 2 months in addition to proteinuria, hematuria, and edema. Subacute renal failure with nephrotic syndrome. + ANA and + Smith but - anti DNA. ? Lupus. Bx completed, and patient tolerated it well.  -urine protein/Cr ratio, urine electrophoresis with elevated lambda and kappa - C3 low, C4 normal, ANCA negative, ANA positive - obtained DS DNA, antiglomerular Ab, extractable nuclear antigen - renal US with medical renal disease - UA with >300 protein, moderate Hgb, micro granular casts - Will follow kidney function closely  Psych: patient initially brought to ED for psych issues. Was initially irate and difficult to control, though following administration of geodon and ativan became more reasonable. Has had several episodes of outbursts and irate behavior, though has been refusing any medications.  -  spoke with psych, appreciate their help, they have advised that we can administer medications without patients consent due to not having capacity-2 MDs have agreed on this  -will administer IM haldol 5 mg and IM ativan 1 mg--> Mood better today  -sitter at bedside   CV: with recent diagnosis of HTN. Recently started on ACE for HTN and renal benefit.  -start norvasc 5 mg daily for BP control -hydralazine PO 10 mg prn for BP >160/110  -will monitor vitals   Heme: patient with hgb 7.4, down from 8.8 one week  ago, though baseline appears to be somewhere between 7.4 and 8.8 with 2 additional readings of 7.6 and 8.4 in the past 2 months. Likely related to renal cause. No complaints of blood loss at this time to indicate source of anemia.  -will monitor on CBC  -anemia panel indicates anemia of chronic disease  -started on aranesp   FEN/GI: heart healthy diet --> has been NPO overnight for procedure Prophylaxis: SQ heparin  Disposition: patient is under IVC, will need to await biopsy and lab results before we able to transfer her to [facility]. Results are not expected until at least Monday 5/12. Code Status: full  Sara [name]

## 2012-08-14 LAB — CBC
HCT: 26 % — ABNORMAL LOW (ref 36.0–46.0)
Hemoglobin: 8.7 g/dL — ABNORMAL LOW (ref 12.0–15.0)
RBC: 3.19 MIL/uL — ABNORMAL LOW (ref 3.87–5.11)
WBC: 6.3 10*3/uL (ref 4.0–10.5)

## 2012-08-14 LAB — BASIC METABOLIC PANEL
BUN: 26 mg/dL — ABNORMAL HIGH (ref 6–23)
Chloride: 107 mEq/L (ref 96–112)
GFR calc non Af Amer: 8 mL/min — ABNORMAL LOW (ref >90)
Glucose, Bld: 97 mg/dL (ref 70–99)
Potassium: 3.5 mEq/L (ref 3.5–5.1)
Sodium: 141 mEq/L (ref 135–145)

## 2012-08-14 MED ORDER — AMLODIPINE BESYLATE 10 MG PO TABS
10.0000 mg | ORAL_TABLET | Freq: Every day | ORAL | Status: DC
  Administered 2012-08-14 – 2012-08-23 (×10): 10 mg via ORAL
  Filled 2012-08-14 (×10): qty 1

## 2012-08-14 MED ORDER — FUROSEMIDE 40 MG PO TABS
40.0000 mg | ORAL_TABLET | Freq: Two times a day (BID) | ORAL | Status: DC
  Administered 2012-08-14 – 2012-08-23 (×18): 40 mg via ORAL
  Filled 2012-08-14 (×22): qty 1
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
## 2012-08-14 NOTE — Progress Notes (Signed)
FMTS Attending Admission Note: Sara Bevill,MD I  have seen and examined this patient, reviewed their chart. I have discussed this patient with the resident. I agree with the resident's findings, assessment and care plan.  

## 2012-08-14 NOTE — Progress Notes (Signed)
Patient ID: Sara [name], female   DOB: January 11, 1981, 31 y.o.   MRN: [id number] Family Medicine Teaching Service Daily Progress Note Service Page: [phone]  Subjective:  Feeling fine today, relaxing in bed.  Is calm.  Took a shower earlier today.   Objective: BP 151/101  Pulse 81  Temp(Src) 98.1 F (36.7 C) (Oral)  Resp 18  Ht 5\' 7"  (1.702 m)  Wt 229 lb 8 oz (104.1 kg)  BMI 35.94 kg/m2  SpO2 97%  Exam: General: NAD, laying comfortably in bed. Calm, seems sleepy Cardiovascular: rrr, no murmur Respiratory: clear throughout Extremities: trace edema BLE Psych: calm, almost sedated despite no recent medications   CBC    Component Value Date/Time   WBC 6.3 08/14/2012 0500   RBC 3.19* 08/14/2012 0500   HGB 8.7* 08/14/2012 0500   HCT 26.0* 08/14/2012 0500   PLT 178 08/14/2012 0500   MCV 81.5 08/14/2012 0500   MCH 27.3 08/14/2012 0500   MCHC 33.5 08/14/2012 0500   RDW 13.5 08/14/2012 0500   LYMPHSABS 2.2 08/08/2012 1610   MONOABS 0.3 08/08/2012 1610   EOSABS 0.0 08/08/2012 1610   BASOSABS 0.0 08/08/2012 1610   BMET    Component Value Date/Time   NA 143 08/13/2012 0835   K 3.5 08/13/2012 0835   CL 110 08/13/2012 0835   CO2 23 08/13/2012 0835   GLUCOSE 102* 08/13/2012 0835   BUN 28* 08/13/2012 0835   CREATININE 6.50* 08/13/2012 0835   CREATININE 4.37* 07/30/2012 1544   CALCIUM 8.9 08/13/2012 0835   GFRNONAA 8* 08/13/2012 0835   GFRAA 9* 08/13/2012 0835      US Renal 08/09/2012  *RADIOLOGY REPORT*  Clinical Data: Rapid progression of renal failure  RENAL/URINARY TRACT ULTRASOUND COMPLETE  Comparison:  None.  Findings:  Right Kidney:  Markedly increased renal parenchymal echogenicity. No hydronephrosis or solid renal lesion.  12.7 cm in length.  Left Kidney:  Markedly increased renal parenchymal echogenicity. No hydronephrosis or solid renal lesion.  12.1 cm in length.  Bladder: The bladder is under distended.  IMPRESSION:  Marked renal parenchymal echogenicity suggest underlying medical renal disease.   No hydronephrosis.   Original Report Authenticated By: Malachy Moan, M.D.      Assessment/Plan: LEOCADIA IDLEMAN is a 32 y.o. year old female with long psych history including multiple IVC for psychotic behavior, HTN, proteinuria, and recent rapid elevation in Cr presenting as transfer from [facility] ED for evaluation of renal function/medical clearance prior to [facility] placement. [name] (Nephrology) has seen patient and is in the process of working her up for autoimmune cause of her acute kidney failure; awaiting further labs to return and if positive for lupus will start treatment.    Renal: patient with rapidly progressing renal decline. Has had increase in Cr to 6.92 from 1.22 in just over 2 months in addition to proteinuria, hematuria, and edema. Subacute renal failure with nephrotic syndrome. + ANA and + Smith but - anti DNA. ? Lupus. Bx completed, will await results -greatly appreciate Nephrology consult and assistance with this case -urine protein/Cr ratio, urine electrophoresis with elevated lambda and kappa - C3 low, C4 normal, ANCA negative, ANA positive - obtained DS DNA, antiglomerular Ab, extractable nuclear antigen - renal US with medical renal disease - UA with >300 protein, moderate Hgb, micro granular casts - Will follow kidney function closely; Cr from this AM pending  Psych: patient initially brought to ED for psych issues. Was irate and difficult to control, though following administration  of geodon and ativan became more reasonable. Has had several episodes of outbursts and irate behavior, though has been refusing any medications; tried to leave [facility] 5/9, but was calmed down and stayed.  -spoke with psych, appreciate their help, they have advised that we can administer medications without patients consent due to not having capacity-2 MDs have agreed on this  -will administer IM haldol 5 mg and IM ativan 1 mg--> Mood better today  -Geodon PRN available, has not been needed  since ED -sitter at bedside  -original IVC is due to run out this weekend, discussed with ACT team and they recommended filling out new forms via SW to continue her IVC --> SW and I will complete forms to continue her IVC   CV: with recent diagnosis of HTN. Recently started on ACE for HTN and renal benefit.  -appreciate renal's assistance with BP mgmt -will increase norvasc to 10mg  and add lasix 40 BID as per renal rec'd 08/13/12 ; monitor BPs -hydralazine PO 10 mg prn for BP >160/110; needed x1 on 5/9 clonidine 0.1mg  po q4hr PRN BP > 170/110; needed x1 on 5/9  -will monitor vitals   Heme: patient with hgb 7.4, down from 8.8 one week ago, though baseline appears to be somewhere between 7.4 and 8.8 with 2 additional readings of 7.6 and 8.4 in the past 2 months. Likely related to renal cause. No complaints of blood loss at this time to indicate source of anemia.  -will monitor on CBC; hgb stable at 8.7 today  -anemia panel indicates anemia of chronic disease  -started on aranesp   FEN/GI: heart healthy diet Prophylaxis: SQ heparin  Disposition: patient is under IVC, will need to await biopsy and lab results before we able to transfer her to [facility]. Results are not expected until at least Monday 5/12 per renal. Code Status: full  Mayce Noyes, MD 08/14/2012, 9:16 AM

## 2012-08-14 NOTE — Progress Notes (Signed)
Events of yest noted.  I agree that she should not go for a pass after her behavior yest.   Please see my note from yest for recs on BP meds

## 2012-08-15 LAB — BASIC METABOLIC PANEL
CO2: 24 mEq/L (ref 19–32)
Calcium: 9 mg/dL (ref 8.4–10.5)
Calcium: 8.8 mg/dL (ref 8.4–10.5)
Chloride: 108 mEq/L (ref 96–112)
Creatinine, Ser: 7.11 mg/dL — ABNORMAL HIGH (ref 0.50–1.10)
GFR calc Af Amer: 8 mL/min — ABNORMAL LOW (ref >90)
GFR calc Af Amer: 8 mL/min — ABNORMAL LOW (ref >90)
GFR calc non Af Amer: 7 mL/min — ABNORMAL LOW (ref >90)
Sodium: 141 mEq/L (ref 135–145)

## 2012-08-15 LAB — CBC
HCT: 29.3 % — ABNORMAL LOW (ref 36.0–46.0)
Hemoglobin: 10 g/dL — ABNORMAL LOW (ref 12.0–15.0)
MCH: 27.5 pg (ref 26.0–34.0)
MCHC: 34.1 g/dL (ref 30.0–36.0)
RBC: 3.63 MIL/uL — ABNORMAL LOW (ref 3.87–5.11)

## 2012-08-15 NOTE — Progress Notes (Signed)
Patient ID: Sara [name], female   DOB: [birth year], 32 y.o.   MRN: [id number] Family Medicine Teaching Service Daily Progress Note Service Page: [phone]  Subjective:  Feels fine, no complaints.  Asking when we think she will be able to go home.   Objective: BP 153/105  Pulse 89  Temp(Src) 98.1 F (36.7 C) (Oral)  Resp 24  Ht 5\' 7"  (1.702 m)  Wt 229 lb 8 oz (104.1 kg)  BMI 35.94 kg/m2  SpO2 99%  Exam: General: NAD, laying comfortably in bed. Calm Cardiovascular: rrr, no murmur Respiratory: clear throughout Extremities: trace edema BLE Psych: calm, appropriate    CBC    Component Value Date/Time   WBC 6.3 08/14/2012 0500   RBC 3.19* 08/14/2012 0500   HGB 8.7* 08/14/2012 0500   HCT 26.0* 08/14/2012 0500   PLT 178 08/14/2012 0500   MCV 81.5 08/14/2012 0500   MCH 27.3 08/14/2012 0500   MCHC 33.5 08/14/2012 0500   RDW 13.5 08/14/2012 0500   LYMPHSABS 2.2 08/08/2012 1610   MONOABS 0.3 08/08/2012 1610   EOSABS 0.0 08/08/2012 1610   BASOSABS 0.0 08/08/2012 1610   BMET    Component Value Date/Time   NA 141 08/14/2012 0927   K 3.5 08/14/2012 0927   CL 107 08/14/2012 0927   CO2 24 08/14/2012 0927   GLUCOSE 97 08/14/2012 0927   BUN 26* 08/14/2012 0927   CREATININE 6.52* 08/14/2012 0927   CREATININE 4.37* 07/30/2012 1544   CALCIUM 8.6 08/14/2012 0927   GFRNONAA 8* 08/14/2012 0927   GFRAA 9* 08/14/2012 0927      US Renal 08/09/2012  *RADIOLOGY REPORT*  Clinical Data: Rapid progression of renal failure  RENAL/URINARY TRACT ULTRASOUND COMPLETE  Comparison:  None.  Findings:  Right Kidney:  Markedly increased renal parenchymal echogenicity. No hydronephrosis or solid renal lesion.  12.7 cm in length.  Left Kidney:  Markedly increased renal parenchymal echogenicity. No hydronephrosis or solid renal lesion.  12.1 cm in length.  Bladder: The bladder is under distended.  IMPRESSION:  Marked renal parenchymal echogenicity suggest underlying medical renal disease.  No hydronephrosis.   Original Report  Authenticated By: Malachy Moan, M.D.      Assessment/Plan: Sara [name] is a 32 y.o. year old female with long psych history including multiple IVC for psychotic behavior, HTN, proteinuria, and recent rapid elevation in Cr presenting as transfer from [facility] ED for evaluation of renal function/medical clearance prior to [facility] placement. [name] (Nephrology) has seen patient and is in the process of working her up for autoimmune cause of her acute kidney failure; awaiting further labs to return and if positive for lupus will start treatment.    Renal: patient with rapidly progressing renal decline. Has had increase in Cr to 6.92 from 1.22 in just over 2 months in addition to proteinuria, hematuria, and edema. Subacute renal failure with nephrotic syndrome. + ANA and + Smith but - anti DNA. ? Lupus. Bx completed, will await results -greatly appreciate Nephrology consult and assistance with this case -urine protein/Cr ratio, urine electrophoresis with elevated lambda and kappa - C3 low, C4 normal, ANCA negative, ANA positive - obtained DS DNA, antiglomerular Ab, extractable nuclear antigen - renal US with medical renal disease - UA with >300 protein, moderate Hgb, micro granular casts - Will follow kidney function closely; Cr from this AM slightly worse at 7.11 (was 6.52 yesterday)   Psych: patient initially brought to ED for psych issues. Was irate and difficult to control, though  following administration of geodon and ativan became more reasonable. Has had several episodes of outbursts and irate behavior, though has been refusing any medications; tried to leave [facility] 5/9, but was calmed down and stayed.  -spoke with psych, appreciate their help, they have advised that we can administer medications without patients consent due to not having capacity-2 MDs have agreed on this  -will administer IM haldol 5 mg and IM ativan 1 mg--> Mood better today  -Geodon PRN available, has not been needed  since ED -sitter at bedside  -original IVC is due to run out this weekend, discussed with ACT team and they recommended filling out new forms via SW to continue her IVC --> SW and I completed forms to continue her IVC, which were denied by the [facility]  SW working on new forms to be signed this AM.   CV: with recent diagnosis of HTN. Recently started on ACE for HTN and renal benefit.  -appreciate renal's assistance with BP mgmt -increased norvasc to 10mg  and add lasix 40 BID as per renal rec 08/13/12 ; monitor BPs - still remain elevated but overall improving  -hydralazine PO 10 mg prn for BP >160/110; not needed in past 24 hours -clonidine 0.1mg  po q4hr PRN BP > 170/110; not needed in past 24 hours    Heme: patient with hgb 7.4, down from 8.8 one week ago, though baseline appears to be somewhere between 7.4 and 8.8 with 2 additional readings of 7.6 and 8.4 in the past 2 months. Likely related to renal cause. No complaints of blood loss at this time to indicate source of anemia.  -will monitor on CBC; hgb stable at 10.0 today -anemia panel indicates anemia of chronic disease  -started on aranesp   FEN/GI: heart healthy diet Prophylaxis: SQ heparin  Disposition: patient is under IVC, will need to await biopsy and lab results before we able to transfer her to [facility]. Results are not expected until at least Monday 5/12 per renal. Code Status: full  Noelene Gang, MD 08/15/2012, 6:50 AM

## 2012-08-15 NOTE — Progress Notes (Signed)
Patient wanted to wait and take Ativan & Haldol at 8 am. Moved scheduled time from 6 am to 8 am.  Judithann Sheen, Lavetta Nielsen RN

## 2012-08-15 NOTE — Progress Notes (Signed)
Patient allowed to visit family (off of hospital grounds), while accompanied by her mother.  See Dr. Phoebe Sharps for details. Upon patient's return to hospital she was A&Ox3, stable, VS's remained WNL.  Pt returned with new bag of belongings which was examined with patient's consent; a pack of cigarettes and a lighter were removed from her room and placed in belongings bag at the nurse's station, with patient ID label attached.

## 2012-08-15 NOTE — Progress Notes (Signed)
Interim Progress Note  Spoke to Nurse. Pt has returned  Sara Flatten, MD Family Medicine PGY-2 08/15/2012, 5:53 PM

## 2012-08-15 NOTE — Progress Notes (Signed)
FMTS Attending Admission Note: Sara [name] I  have seen and examined this patient, reviewed their chart. I have discussed this patient with the resident. I agree with the resident's findings, assessment and care plan.  Patient asked if she could go home today and return,my concern is that she might not return even though she need medical attention,at this time I advise she stay and have her family come visit her in the hospital pending psych clearance.

## 2012-08-15 NOTE — Progress Notes (Signed)
Interim Progress Note   Called to bedside to talk to mother and pt regarding "day pass" to be able to see family on Mother's Day. Lengthy and frank discussion had w/ mother and pt regarding seriousness of medical condition and need for further treatment in order to ensure personal safety and safety of those around her. Pt expressed understanding of needing more treatment in order to maintain and improve on current emotional/psychological stability. Mother expressed understanding of seriousness of psychological condition and potential ramifications of pt not returning for further care. Mother stated that she would have pt return prior to going to work tonight around 16:30. Discussed w/ nursing staff and OKd to leave. Nursing staff to call upon pt's return.   Shelly Flatten, MD Family Medicine PGY-2 08/15/2012, 3:51 PM

## 2012-08-15 NOTE — Progress Notes (Signed)
Hopefully will have renal bx results in am so some decisions can be made.  Nothing further to add at this time

## 2012-08-15 NOTE — Progress Notes (Signed)
CSW contacted by MD this am indicating that IVC papers were declined and needed to be resubmitted. CSW resubmitted IVC papers to Magistrate and called to confirm that they were received / accepted. Magistrate will call CSW back as soon as possible.  Cori Razor LCSW [phone]

## 2012-08-16 DIAGNOSIS — F29 Unspecified psychosis not due to a substance or known physiological condition: Secondary | ICD-10-CM | POA: Diagnosis present

## 2012-08-16 DIAGNOSIS — Z9119 Patient's noncompliance with other medical treatment and regimen: Secondary | ICD-10-CM

## 2012-08-16 DIAGNOSIS — R768 Other specified abnormal immunological findings in serum: Secondary | ICD-10-CM

## 2012-08-16 DIAGNOSIS — F3110 Bipolar disorder, current episode manic without psychotic features, unspecified: Secondary | ICD-10-CM

## 2012-08-16 DIAGNOSIS — R7989 Other specified abnormal findings of blood chemistry: Secondary | ICD-10-CM | POA: Diagnosis present

## 2012-08-16 DIAGNOSIS — F309 Manic episode, unspecified: Secondary | ICD-10-CM | POA: Diagnosis present

## 2012-08-16 HISTORY — DX: Other specified abnormal immunological findings in serum: R76.8

## 2012-08-16 LAB — CBC
Platelets: 207 10*3/uL (ref 150–400)
RBC: 3.52 MIL/uL — ABNORMAL LOW (ref 3.87–5.11)
WBC: 5.3 10*3/uL (ref 4.0–10.5)

## 2012-08-16 LAB — BASIC METABOLIC PANEL
CO2: 27 mEq/L (ref 19–32)
Chloride: 105 mEq/L (ref 96–112)
Sodium: 140 mEq/L (ref 135–145)

## 2012-08-16 MED ORDER — POTASSIUM CHLORIDE CRYS ER 20 MEQ PO TBCR: 40.0000 meq | EXTENDED_RELEASE_TABLET | Freq: Two times a day (BID) | ORAL | Status: DC

## 2012-08-16 MED ORDER — CALCITRIOL 0.25 MCG PO CAPS
0.2500 ug | ORAL_CAPSULE | Freq: Every day | ORAL | Status: DC
  Administered 2012-08-17 – 2012-08-23 (×7): 0.25 ug via ORAL
  Filled 2012-08-16 (×8): qty 1

## 2012-08-16 MED ORDER — POTASSIUM CHLORIDE CRYS ER 20 MEQ PO TBCR
40.0000 meq | EXTENDED_RELEASE_TABLET | Freq: Two times a day (BID) | ORAL | Status: AC
  Filled 2012-08-16: qty 2

## 2012-08-16 NOTE — Progress Notes (Signed)
Sara [name] 32 y.o. Patient requesting another outside pass today. She was given permission yesterday to walk outside with mother and did return. This is a possibility to allow this again today, after nephrology and psych have had the chance to see her today. Of course her demeanor throughout the day will also weigh on the decision as well.
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
## 2012-08-16 NOTE — Clinical Social Work Note (Signed)
CSW received a call from psychiatrist who stated pt will need acute inpatient psychiatric stabilization upon d/c.  CSW referred pt to: [facility] [place] - no beds  [facility] - declined  Vickii Penna, [place] [phone]  Clinical Social Work

## 2012-08-16 NOTE — Progress Notes (Signed)
Patient ID: Sara [name], female   DOB: Dec 25, 1980, 32 y.o.   MRN: [id number] Family Medicine Teaching Service Daily Progress Note Service Page: [phone]  Subjective:  Patient is on the phone talking to her bank. She is well mannered and pleasant this morning. When told we are awaiting the biopsy report, to decide on further management,she asks if then can she go home.  I explained will be discharged from the hospital, she will go somewhere, but I am not certain if it will be home.   Objective: BP 151/102  Pulse 94  Temp(Src) 98.3 F (36.8 C) (Oral)  Resp 20  Ht 5\' 7"  (1.702 m)  Wt 220 lb 7.4 oz (100 kg)  BMI 34.52 kg/m2  SpO2 100%  Exam: General: NAD, laying comfortably in bed. Calm Cardiovascular: rrr, no murmur Respiratory: clear throughout Extremities: trace edema BLE Psych: calm, appropriate    CBC    Component Value Date/Time   WBC 5.3 08/16/2012 0605   RBC 3.52* 08/16/2012 0605   HGB 9.9* 08/16/2012 0605   HCT 28.7* 08/16/2012 0605   PLT 207 08/16/2012 0605   MCV 81.5 08/16/2012 0605   MCH 28.1 08/16/2012 0605   MCHC 34.5 08/16/2012 0605   RDW 13.4 08/16/2012 0605   LYMPHSABS 2.2 08/08/2012 1610   MONOABS 0.3 08/08/2012 1610   EOSABS 0.0 08/08/2012 1610   BASOSABS 0.0 08/08/2012 1610   BMET    Component Value Date/Time   NA 140 08/16/2012 0852   K 3.2* 08/16/2012 0852   CL 105 08/16/2012 0852   CO2 27 08/16/2012 0852   GLUCOSE 97 08/16/2012 0852   BUN 29* 08/16/2012 0852   CREATININE 7.46* 08/16/2012 0852   CREATININE 4.37* 07/30/2012 1544   CALCIUM 8.8 08/16/2012 0852   GFRNONAA 7* 08/16/2012 0852   GFRAA 8* 08/16/2012 0852      US Renal 08/09/2012  *RADIOLOGY REPORT*  Clinical Data: Rapid progression of renal failure  RENAL/URINARY TRACT ULTRASOUND COMPLETE  Comparison:  None.  Findings:  Right Kidney:  Markedly increased renal parenchymal echogenicity. No hydronephrosis or solid renal lesion.  12.7 cm in length.  Left Kidney:  Markedly increased renal parenchymal  echogenicity. No hydronephrosis or solid renal lesion.  12.1 cm in length.  Bladder: The bladder is under distended.  IMPRESSION:  Marked renal parenchymal echogenicity suggest underlying medical renal disease.  No hydronephrosis.   Original Report Authenticated By: Malachy Moan, M.D.      Assessment/Plan: Sara [name] is a 32 y.o. year old female with long psych history including multiple IVC for psychotic behavior, HTN, proteinuria, and recent rapid elevation in Cr presenting as transfer from [facility] ED for evaluation of renal function/medical clearance prior to [facility] placement. [name] (Nephrology) has seen patient and is in the process of working her up for autoimmune cause of her acute kidney failure; awaiting further labs to return and if positive for lupus will start treatment.    Renal: patient with rapidly progressing renal decline. Has had increase in Cr to 6.92 from 1.22 in just over 2 months in addition to proteinuria, hematuria, and edema. Subacute renal failure with nephrotic syndrome. + ANA and + Smith but - anti DNA. ? Lupus. Bx completed, hopefully will receive results today -greatly appreciate Nephrology consult and assistance with this case -urine protein/Cr ratio, urine electrophoresis with elevated lambda and kappa - C3 low, C4 normal, ANCA negative, ANA positive - obtained DS DNA, antiglomerular Ab, extractable nuclear antigen - renal US with medical renal  disease - UA with >300 protein, moderate Hgb, micro granular casts - Will follow kidney function closely; Cr from this AM 7.46  Psych: patient initially brought to ED for psych issues. Was irate and difficult to control, though following administration of geodon and ativan became more reasonable. Has had several episodes of outbursts and irate behavior, though has been refusing any medications; tried to leave [facility] 5/9, but was calmed down and stayed.  -spoke with psych, appreciate their help, they have advised that  we can administer medications without patients consent due to not having capacity-2 MDs have agreed on this  -will administer IM haldol 5 mg and IM ativan 1 mg -Geodon PRN available, has not been needed since ED -sitter at bedside  - IVC renewed 5/11 --> will expire 5/18 - Will need psych to see her again and obtain bed at [facility]  CV: with recent diagnosis of HTN. Recently started on ACE for HTN and renal benefit.  -appreciate renal's assistance with BP mgmt -increased norvasc to 10mg  and add lasix 40 BID as per renal rec 08/13/12 ; monitor BPs - still remain elevated but overall improving  -hydralazine PO 10 mg prn for BP >160/110; not needed in past 24 hours -clonidine 0.1mg  po q4hr PRN BP > 170/110; not needed in past 24 hours    Heme: patient with hgb 7.4, down from 8.8 one week ago, though baseline appears to be somewhere between 7.4 and 8.8 with 2 additional readings of 7.6 and 8.4 in the past 2 months. Likely related to renal cause. No complaints of blood loss at this time to indicate source of anemia.  -will monitor on CBC; hgb stable at 9.9 today -anemia panel indicates anemia of chronic disease  -started on aranesp   FEN/GI: heart healthy diet Prophylaxis: SQ heparin  Disposition: patient is under IVC, will need to await biopsy and lab results before we able to transfer her to [facility]. Results are not expected until at least Monday 5/12 per renal. Code Status: full  Felix Pacini, DO 08/16/2012, 11:27 AM

## 2012-08-16 NOTE — Clinical Social Work Psych Note (Signed)
CSW reviewed IVC paperwork.  Paperwork was served on 5/11 and will expire on 5/18.  1st opinion in chart as well.  Vickii Penna, LCSWA [phone]  Clinical Social Work
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
## 2012-08-16 NOTE — Progress Notes (Signed)
FMTS Attending Daily Note:  Renold Don MD  [phone] pager  Family Practice pager:  [phone] I have seen and examined this patient and have reviewed their chart. I have discussed this patient with the resident. I agree with the resident's findings, assessment and care plan.  Additionally:  Patient admitted for frank psychosis as well as AKI.  Renal biopsy obtained and still pending at this time.  Strongly positive ANA titers. Appreciate neurology's input regarding kidney function.  Also appreciate psychiatry's input in managing her behavioral health needs.    Tobey Grim, MD 08/16/2012 4:24 PM

## 2012-08-17 DIAGNOSIS — R894 Abnormal immunological findings in specimens from other organs, systems and tissues: Secondary | ICD-10-CM

## 2012-08-17 LAB — CBC
HCT: 28.4 % — ABNORMAL LOW (ref 36.0–46.0)
MCV: 80.9 fL (ref 78.0–100.0)
Platelets: 215 10*3/uL (ref 150–400)
RBC: 3.51 MIL/uL — ABNORMAL LOW (ref 3.87–5.11)
WBC: 5.4 10*3/uL (ref 4.0–10.5)

## 2012-08-17 LAB — BASIC METABOLIC PANEL
BUN: 30 mg/dL — ABNORMAL HIGH (ref 6–23)
CO2: 26 mEq/L (ref 19–32)
Chloride: 104 mEq/L (ref 96–112)
Creatinine, Ser: 7.62 mg/dL — ABNORMAL HIGH (ref 0.50–1.10)
Potassium: 3.1 mEq/L — ABNORMAL LOW (ref 3.5–5.1)

## 2012-08-17 MED ORDER — POTASSIUM CHLORIDE CRYS ER 20 MEQ PO TBCR: 40.0000 meq | EXTENDED_RELEASE_TABLET | Freq: Two times a day (BID) | ORAL | Status: DC

## 2012-08-17 MED ORDER — POTASSIUM CHLORIDE CRYS ER 20 MEQ PO TBCR
40.0000 meq | EXTENDED_RELEASE_TABLET | Freq: Two times a day (BID) | ORAL | Status: AC
  Administered 2012-08-17: 40 meq via ORAL
  Filled 2012-08-17: qty 2

## 2012-08-17 NOTE — Progress Notes (Signed)
FMTS Attending Admission Note: Sara Don MD Personal pager:  [phone] FPTS Service Pager:  [phone]  I  have seen and examined this patient, reviewed their chart. I have discussed this patient with the resident. I agree with the resident's findings, assessment and care plan.  Additionally:

## 2012-08-17 NOTE — Progress Notes (Signed)
Patient ID: Sara [name], female   DOB: May 27, 1980, 32 y.o.   MRN: [id number] Family Medicine Teaching Service Daily Progress Note Service Page: [phone]  Subjective:  Doing well. No complaints. Asking when she gets to leave, told at earliest it would be Wednesday that we get the biopsy results back. Then patient stated that she had been told Monday, but that she's now past that.   Objective: BP 151/100  Pulse 94  Temp(Src) 99 F (37.2 C) (Oral)  Resp 18  Ht 5\' 7"  (1.702 m)  Wt 220 lb 7.4 oz (100 kg)  BMI 34.52 kg/m2  SpO2 99%  Exam: General: NAD, laying comfortably in bed. Calm. Cardiovascular: rrr, no murmur Respiratory: CTAB Extremities: trace edema BLE Psych: calm, appropriate    CBC    Component Value Date/Time   WBC 5.4 08/17/2012 0537   RBC 3.51* 08/17/2012 0537   HGB 9.5* 08/17/2012 0537   HCT 28.4* 08/17/2012 0537   PLT 215 08/17/2012 0537   MCV 80.9 08/17/2012 0537   MCH 27.1 08/17/2012 0537   MCHC 33.5 08/17/2012 0537   RDW 13.2 08/17/2012 0537   LYMPHSABS 2.2 08/08/2012 1610   MONOABS 0.3 08/08/2012 1610   EOSABS 0.0 08/08/2012 1610   BASOSABS 0.0 08/08/2012 1610   BMET    Component Value Date/Time   NA 139 08/17/2012 0537   K 3.1* 08/17/2012 0537   CL 104 08/17/2012 0537   CO2 26 08/17/2012 0537   GLUCOSE 84 08/17/2012 0537   BUN 30* 08/17/2012 0537   CREATININE 7.62* 08/17/2012 0537   CREATININE 4.37* 07/30/2012 1544   CALCIUM 8.9 08/17/2012 0537   GFRNONAA 6* 08/17/2012 0537   GFRAA 7* 08/17/2012 0537      US Renal 08/09/2012  *RADIOLOGY REPORT*  Clinical Data: Rapid progression of renal failure  RENAL/URINARY TRACT ULTRASOUND COMPLETE  Comparison:  None.  Findings:  Right Kidney:  Markedly increased renal parenchymal echogenicity. No hydronephrosis or solid renal lesion.  12.7 cm in length.  Left Kidney:  Markedly increased renal parenchymal echogenicity. No hydronephrosis or solid renal lesion.  12.1 cm in length.  Bladder: The bladder is under distended.   IMPRESSION:  Marked renal parenchymal echogenicity suggest underlying medical renal disease.  No hydronephrosis.   Original Report Authenticated By: Malachy Moan, M.D.      Assessment/Plan: Sara [name] is a 32 y.o. year old female with long psych history including multiple IVC for psychotic behavior, HTN, proteinuria, and recent rapid elevation in Cr presenting as transfer from [facility] ED for evaluation of renal function/medical clearance prior to [facility] placement. [name] (Nephrology) has seen patient and is in the process of working her up for autoimmune cause of her acute kidney failure; awaiting further labs to return and if positive for lupus will start treatment.    Renal: patient with rapidly progressing renal decline. Has had increase in Cr to 6.92 from 1.22 in just over 2 months in addition to proteinuria, hematuria, and edema. Subacute renal failure with nephrotic syndrome. + ANA and + Smith but - anti DNA. ? Lupus. Bx completed, hopefully will receive results Wednesday. -greatly appreciate Nephrology consult and assistance with this case -urine protein/Cr ratio, urine electrophoresis with elevated lambda and kappa - C3 low, C4 normal, ANCA negative, ANA positive - obtained DS DNA (nml), antiglomerular Ab (nml), extractable nuclear antigen - renal US with medical renal disease - UA with >300 protein, moderate Hgb, micro granular casts - Will follow kidney function closely; Cr from this  AM 7.67 - hyperparathyroid-with PTH >300-renal started on vitamin D  Psych: patient initially brought to ED for psych issues. Was irate and difficult to control, though following administration of geodon and ativan became more reasonable. Has had several episodes of outbursts and irate behavior, though has been refusing any medications; tried to leave [facility] 5/9, but was calmed down and stayed.  -spoke with psych, appreciate their help, they have advised that we can administer medications without  patients consent due to not having capacity-2 MDs have agreed on this  -will administer IM haldol 5 mg and IM ativan 1 mg -Geodon PRN available, has not been needed since ED -sitter at bedside  - IVC renewed 5/11 --> will expire 5/18 - bed request for psych faxed out   CV: with recent diagnosis of HTN. Recently started on ACE for HTN and renal benefit.  -appreciate renal's assistance with BP mgmt -increased norvasc to 10mg  and add lasix 40 BID as per renal rec 08/13/12 ; monitor BPs - still remain elevated but overall improving  -hydralazine PO 10 mg prn for BP >160/110; not needed in past 24 hours -clonidine 0.1mg  po q4hr PRN BP > 170/110; not needed in past 24 hours    Heme: patient with hgb 7.4, down from 8.8 one week ago, though baseline appears to be somewhere between 7.4 and 8.8 with 2 additional readings of 7.6 and 8.4 in the past 2 months. Likely related to renal cause. No complaints of blood loss at this time to indicate source of anemia.  -will monitor on CBC; hgb stable at 9.5 today -anemia panel indicates anemia of chronic disease  -started on aranesp   FEN/GI: heart healthy diet Prophylaxis: SQ heparin  Disposition: patient is under IVC, will need to await biopsy and lab results before we able to transfer her to [facility]. Results are not expected until at least 5/14 per renal. Code Status: full  Marikay Alar, DO 08/17/2012, 7:21 AM

## 2012-08-17 NOTE — Progress Notes (Signed)
Utilization review completed.  

## 2012-08-17 NOTE — Progress Notes (Signed)
Subjective:  No new issues. I did confirm that specimen made it to [facility] today, so no results til tomorrow at the earliest.  UOP not recorded  Objective Vital signs in last 24 hours: Filed Vitals:   08/16/12 1400 08/16/12 1800 08/17/12 0616 08/17/12 0916  BP: 145/102 147/113 151/100 145/100  Pulse: 116 100 94 90  Temp: 99.1 F (37.3 C) 99.2 F (37.3 C) 99 F (37.2 C) 98.4 F (36.9 C)  TempSrc: Oral Oral Oral   Resp: 20 18 18 18   Height:      Weight:      SpO2: 100% 96% 99% 100%   Weight change:   Intake/Output Summary (Last 24 hours) at 08/17/12 1129 Last data filed at 08/17/12 0917  Gross per 24 hour  Intake    480 ml  Output      0 ml  Net    480 ml   Labs: Basic Metabolic Panel:  Recent Labs Lab 08/11/12 0500 08/12/12 0455  08/15/12 0810 08/16/12 0852 08/17/12 0537  NA 141 143  < > 141 140 139  K 3.4* 3.7  < > 3.2* 3.2* 3.1*  CL 109 111  < > 107 105 104  CO2 22 19  < > 24 27 26   GLUCOSE 91 87  < > 118* 97 84  BUN 29* 28*  < > 29* 29* 30*  CREATININE 6.51* 6.30*  < > 6.87* 7.46* 7.62*  CALCIUM 8.1* 8.3*  < > 8.8 8.8 8.9  PHOS 4.6 4.6  --   --   --   --   < > = values in this interval not displayed. Liver Function Tests:  Recent Labs Lab 08/11/12 0500 08/12/12 0455  ALBUMIN 2.2* 2.2*   No results found for this basename: LIPASE, AMYLASE,  in the last 168 hours No results found for this basename: AMMONIA,  in the last 168 hours CBC:  Recent Labs Lab 08/13/12 0635 08/14/12 0500 08/15/12 0635 08/16/12 0605 08/17/12 0537  WBC 6.1 6.3 5.8 5.3 5.4  HGB 9.4* 8.7* 10.0* 9.9* 9.5*  HCT 27.8* 26.0* 29.3* 28.7* 28.4*  MCV 81.0 81.5 80.7 81.5 80.9  PLT 196 178 208 207 215   Cardiac Enzymes: No results found for this basename: CKTOTAL, CKMB, CKMBINDEX, TROPONINI,  in the last 168 hours CBG: No results found for this basename: GLUCAP,  in the last 168 hours  Iron Studies: No results found for this basename: IRON, TIBC, TRANSFERRIN, FERRITIN,  in the last  72 hours Studies/Results: No results found. Medications: Infusions: . sodium chloride Stopped (08/09/12 1900)    Scheduled Medications: . amLODipine  10 mg Oral Daily  . calcitRIOL  0.25 mcg Oral Daily  . darbepoetin (ARANESP) injection - NON-DIALYSIS  100 mcg Subcutaneous Q Mon-1800  . furosemide  40 mg Oral BID  . haloperidol  5 mg Oral Q6H   Or  . haloperidol lactate  5 mg Intramuscular Q6H  . heparin  5,000 Units Subcutaneous Q8H  . LORazepam  1 mg Oral Q6H   Or  . LORazepam  1 mg Intramuscular Q6H  . nicotine  21 mg Transdermal Daily  . potassium chloride  40 mEq Oral BID    have reviewed scheduled and prn medications.  Physical Exam: General: alert, agitated slightly wants to get outside Heart: RRR  Lungs: clear Abdomen: obese, soft non tender Extremities: possibly some trace edema "they are not swollen, I am big boned"    Assessment/ Plan: Pt is a 32 y.o.  yo female who was admitted on 08/08/2012 with subacute renal failure and behavioral issues  Assessment/Plan: 1. Renal- subacute renal failure with nephrotic range proteinuria- biopsy has been done and awaiting results so we can formulate a treatment plan. Unfortunately these results are not likely to be back until Wednesday.  Serologies indicating a possible lupus etiology.  Treatment will be difficult unless pt is in a controlled setting.  No indications for HD just yet but GFR is quite low, dialysis would be a complicated issue in her as well.   2. HTN- on amlodipine 10 and lasix 40 BID- will take some time for amlodipine to "kick in" no changes rec today 3. Anemia- stable for now- on ESA 4. Secondary hyperparathyroidism- phos and calc are OK- PTH over 300 have started vitamin D 5. Behavioral/psych issues- complicating all   Sara [name] A   08/17/2012,11:29 AM  LOS: 9 days

## 2012-08-18 LAB — BASIC METABOLIC PANEL
BUN: 32 mg/dL — ABNORMAL HIGH (ref 6–23)
Chloride: 103 mEq/L (ref 96–112)
Creatinine, Ser: 8.21 mg/dL — ABNORMAL HIGH (ref 0.50–1.10)
GFR calc Af Amer: 7 mL/min — ABNORMAL LOW (ref >90)
Glucose, Bld: 84 mg/dL (ref 70–99)

## 2012-08-18 LAB — CBC
HCT: 28.8 % — ABNORMAL LOW (ref 36.0–46.0)
Hemoglobin: 9.8 g/dL — ABNORMAL LOW (ref 12.0–15.0)
MCV: 80.2 fL (ref 78.0–100.0)
RDW: 12.9 % (ref 11.5–15.5)
WBC: 4.6 10*3/uL (ref 4.0–10.5)

## 2012-08-18 MED ORDER — SODIUM CHLORIDE 0.9 % IV SOLN
750.0000 mg | Freq: Every day | INTRAVENOUS | Status: AC
  Administered 2012-08-18 – 2012-08-20 (×3): 750 mg via INTRAVENOUS
  Filled 2012-08-18 (×4): qty 6

## 2012-08-18 MED ORDER — PANTOPRAZOLE SODIUM 40 MG PO TBEC
40.0000 mg | DELAYED_RELEASE_TABLET | Freq: Every day | ORAL | Status: DC
  Administered 2012-08-18 – 2012-08-23 (×4): 40 mg via ORAL
  Filled 2012-08-18 (×5): qty 1

## 2012-08-18 MED ORDER — MYCOPHENOLATE MOFETIL 250 MG PO CAPS
1500.0000 mg | ORAL_CAPSULE | Freq: Two times a day (BID) | ORAL | Status: DC
  Administered 2012-08-18 – 2012-08-23 (×9): 1500 mg via ORAL
  Filled 2012-08-18 (×11): qty 6

## 2012-08-18 NOTE — Progress Notes (Signed)
FMTS Attending Daily Note:  Renold Don MD  [phone] pager  Family Practice pager:  [phone] I have seen and examined this patient and have reviewed their chart. I have discussed this patient with the resident. I agree with the resident's findings, assessment and care plan.  Additionally:  Still awaiting results of biopsy.  Hopefully this afternoon.    Tobey Grim, MD 08/18/2012 1:17 PM

## 2012-08-18 NOTE — Progress Notes (Signed)
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    Objective Vital signs in last 24 hours: Filed Vitals:   08/17/12 1626 08/17/12 2211 08/18/12 0529 08/18/12 0905  BP: 144/96 155/104 145/101 141/86  Pulse: 93 108 98 97  Temp: 98.6 F (37 C) 98.7 F (37.1 C) 98.1 F (36.7 C) 98.2 F (36.8 C)  TempSrc:  Oral Oral Oral  Resp: 18 18 20 18   Height:  5\' 7"  (1.702 m)    Weight:  97.705 kg (215 lb 6.4 oz)    SpO2: 98% 100% 99% 100%   Weight change:   Intake/Output Summary (Last 24 hours) at 08/18/12 1556 Last data filed at 08/18/12 1135  Gross per 24 hour  Intake    700 ml  Output   2150 ml  Net  -1450 ml   Labs: Basic Metabolic Panel:  Recent Labs Lab 08/12/12 0455  08/16/12 0852 08/17/12 0537 08/18/12 0605  NA 143  < > 140 139 142  K 3.7  < > 3.2* 3.1* 3.4*  CL 111  < > 105 104 103  CO2 19  < > 27 26 26   GLUCOSE 87  < > 97 84 84  BUN 28*  < > 29* 30* 32*  CREATININE 6.30*  < > 7.46* 7.62* 8.21*  CALCIUM 8.3*  < > 8.8 8.9 9.1  PHOS 4.6  --   --   --   --   < > = values in this interval not displayed. Liver Function Tests:  Recent Labs Lab 08/12/12 0455  ALBUMIN 2.2*   No results found for this basename: LIPASE, AMYLASE,  in the last 168 hours No results found for this basename: AMMONIA,  in the last 168 hours CBC:  Recent Labs Lab 08/14/12 0500 08/15/12 0635 08/16/12 0605 08/17/12 0537 08/18/12 0605  WBC 6.3 5.8 5.3 5.4 4.6  HGB 8.7* 10.0* 9.9* 9.5* 9.8*  HCT 26.0* 29.3* 28.7* 28.4* 28.8*  MCV 81.5 80.7 81.5 80.9 80.2  PLT 178 208 207 215 239   Cardiac Enzymes: No results found for this basename: CKTOTAL, CKMB, CKMBINDEX, TROPONINI,  in the last 168 hours CBG: No results found for this basename: GLUCAP,  in the last 168 hours  Iron Studies: No results found for this basename: IRON, TIBC, TRANSFERRIN,  FERRITIN,  in the last 72 hours Studies/Results: No results found. Medications: Infusions: . sodium chloride Stopped (08/09/12 1900)    Scheduled Medications: . amLODipine  10 mg Oral Daily  . calcitRIOL  0.25 mcg Oral Daily  . darbepoetin (ARANESP) injection - NON-DIALYSIS  100 mcg Subcutaneous Q Mon-1800  . furosemide  40 mg Oral BID  . haloperidol  5 mg Oral Q6H   Or  . haloperidol lactate  5 mg Intramuscular Q6H  . heparin  5,000 Units Subcutaneous Q8H  . LORazepam  1 mg Oral Q6H   Or  . LORazepam  1 mg Intramuscular Q6H  . methylPREDNISolone (SOLU-MEDROL) injection  750 mg Intravenous Daily  . mycophenolate  1,500 mg Oral BID  . nicotine  21 mg Transdermal Daily  . pantoprazole  40 mg Oral Daily    have reviewed scheduled and prn medications.  Physical Exam: General: alert, agitated slightly wants to get outside Heart: RRR  Lungs: clear Abdomen: obese, soft non tender Extremities: possibly some trace edema "they are not swollen, I am big boned"  Assessment/ Plan: Pt is a 32 y.o. yo female who was admitted on 08/08/2012 with subacute renal failure and behavioral issues  Assessment/Plan: 1. 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  The treatment plan consists of high dose steroids and either cytoxan or cellcept.  I have spoken with Dr. Briant Cedar and we have decided that cellcept might be there better treatment option because if we gave IV cytoxan and she never returned for follow up she could get neutropenic and die- if we do cellcept she needs to take and if she doesn't take the outcome will be dialysis which we can deal with.  I would like to give at least one dose of IV solumedrol here in the hospital tonight and for as many days as she might still be here up to 3. She of course is wanting to go home- I dont know how realistic that is. I also dont know if IV solumedrol could be given at [facility]?    2. HTN- on amlodipine 10 and lasix 40 BID- will take some time for amlodipine to "kick in" no changes rec today 3. Anemia- stable for now- on ESA 4. Secondary hyperparathyroidism- phos and calc are OK- PTH over 300 will start some vitamin D 5. Behavioral/psych issues- complicating all   Taytem Ghattas A   08/18/2012,3:56 PM  LOS: 10 days

## 2012-08-18 NOTE — Clinical Social Work Note (Signed)
Psych CSW reviewed chart and discussed pt disposition with RN.  Pt not quite medically stable per report.  Psych CSW will continue to follow.  Vickii Penna, LCSWA [phone]  Clinical Social Work

## 2012-08-18 NOTE — Progress Notes (Signed)
Still awaiting preliminary read on biopsy- they say maybe this afternoon.  As soon as I know anything will speak with pt and contact primary team.  Is OK for her to go out briefly from my standpoint.   Tarin Johndrow A

## 2012-08-18 NOTE — Progress Notes (Signed)
Patient ID: Sara [name], female   DOB: 07/08/1980, 32 y.o.   MRN: [id number] Family Medicine Teaching Service Daily Progress Note Service Page: [phone]  Subjective:  Patient is in the room awake, in the dark, with her mother this morning. She is calm and pleasant. She seems to be in denial about [facility] admission on discharge, as she repeated states she wants to go home now. Psych and social work have been working with her and arranging placement on discharge. Explained to her this morning, we are still awaiting her pathology results of her biopsy, that was received at [facility] and possibly have results today, but no guarantee. She asks again to go outside with her mother. I explained to her we could let her go for 15 minutes, with her mother, once nephrology has had the chance to see her today. She voiced understanding.   Objective: BP 141/86  Pulse 97  Temp(Src) 98.2 F (36.8 C) (Oral)  Resp 18  Ht 5\' 7"  (1.702 m)  Wt 215 lb 6.4 oz (97.705 kg)  BMI 33.73 kg/m2  SpO2 100%  Exam: General: NAD, laying comfortably in bed. Calm. Cardiovascular: rrr, no murmur Respiratory: CTAB Extremities: trace edema BLE Psych: calm, appropriate    CBC    Component Value Date/Time   WBC 4.6 08/18/2012 0605   RBC 3.59* 08/18/2012 0605   HGB 9.8* 08/18/2012 0605   HCT 28.8* 08/18/2012 0605   PLT 239 08/18/2012 0605   MCV 80.2 08/18/2012 0605   MCH 27.3 08/18/2012 0605   MCHC 34.0 08/18/2012 0605   RDW 12.9 08/18/2012 0605   LYMPHSABS 2.2 08/08/2012 1610   MONOABS 0.3 08/08/2012 1610   EOSABS 0.0 08/08/2012 1610   BASOSABS 0.0 08/08/2012 1610   BMET    Component Value Date/Time   NA 142 08/18/2012 0605   K 3.4* 08/18/2012 0605   CL 103 08/18/2012 0605   CO2 26 08/18/2012 0605   GLUCOSE 84 08/18/2012 0605   BUN 32* 08/18/2012 0605   CREATININE 8.21* 08/18/2012 0605   CREATININE 4.37* 07/30/2012 1544   CALCIUM 9.1 08/18/2012 0605   GFRNONAA 6* 08/18/2012 0605   GFRAA 7* 08/18/2012 0605      US Renal 08/09/2012   *RADIOLOGY REPORT*  Clinical Data: Rapid progression of renal failure  RENAL/URINARY TRACT ULTRASOUND COMPLETE  Comparison:  None.  Findings:  Right Kidney:  Markedly increased renal parenchymal echogenicity. No hydronephrosis or solid renal lesion.  12.7 cm in length.  Left Kidney:  Markedly increased renal parenchymal echogenicity. No hydronephrosis or solid renal lesion.  12.1 cm in length.  Bladder: The bladder is under distended.  IMPRESSION:  Marked renal parenchymal echogenicity suggest underlying medical renal disease.  No hydronephrosis.   Original Report Authenticated By: Malachy Moan, M.D.      Assessment/Plan: Sara [name] is a 32 y.o. year old female with long psych history including multiple IVC for psychotic behavior, HTN, proteinuria, and recent rapid elevation in Cr presenting as transfer from [facility] ED for evaluation of renal function/medical clearance prior to [facility] placement. [name] (Nephrology) has seen patient and is in the process of working her up for autoimmune cause of her acute kidney failure; awaiting further labs to return and if positive for lupus will start treatment.    Renal: patient with rapidly progressing renal decline. Has had increase in Cr to 6.92 from 1.22 in just over 2 months in addition to proteinuria, hematuria, and edema. Subacute renal failure with nephrotic syndrome. + ANA and +  Smith but - anti DNA. ? Lupus. Bx completed, hopefully will receive results Wednesday. Greatly appreciate Nephrology consult and assistance with this case. -urine protein/Cr ratio, urine electrophoresis with elevated lambda and kappa - C3 low, C4 normal, ANCA negative, ANA positive - renal US with medical renal disease - UA with >300 protein, moderate Hgb, micro granular casts - Will follow kidney function closely; Cr from this AM 8.21 - hyperparathyroid-with PTH >300-renal started on vitamin D  Psych: patient initially brought to ED for psych issues. Was irate and  difficult to control, though following administration of geodon and ativan became more reasonable. Has had several episodes of outbursts and irate behavior, though has been refusing any medications; tried to leave [facility] 5/9, but was calmed down and stayed.  -spoke with psych, appreciate their help, they have advised that we can administer medications without patients consent due to not having capacity-2 MDs have agreed on this  -will administer IM haldol 5 mg Q6 and IM ativan 1 mg  -Geodon PRN available, has not been needed since ED -sitter at bedside  - IVC renewed 5/11 --> will expire 5/18 - bed request for psych faxed out, awaiting placement  CV: with recent diagnosis of HTN. Recently started on ACE for HTN and renal benefit.  -appreciate renal's assistance with BP mgmt -increased norvasc to 10mg  and add lasix 40 BID as per renal rec 08/13/12 ; monitor BPs - still remain elevated but overall improving  -hydralazine PO 10 mg prn for BP >160/110; not needed in past 24 hours -clonidine 0.1mg  po q4hr PRN BP > 170/110; not needed in past 24 hours    Heme: patient with hgb 7.4, down from 8.8 one week ago, though baseline appears to be somewhere between 7.4 and 8.8 with 2 additional readings of 7.6 and 8.4 in the past 2 months. Likely related to renal cause. No complaints of blood loss at this time to indicate source of anemia.  -will monitor on CBC; hgb stable at 9.8 today -anemia panel indicates anemia of chronic disease  -started on aranesp   FEN/GI: heart healthy diet Prophylaxis: SQ heparin  Disposition: patient is under IVC, will need to await biopsy and lab results before we able to transfer her to [facility]. Results are not expected until at least 5/14 per renal. Code Status: full  Sara Pacini, DO 08/18/2012, 9:25 AM

## 2012-08-18 NOTE — Progress Notes (Signed)
Pt's mother unable to come to the floor tonight. Pt is adamant to go off the floor. Per MD's it is ok for pt to leave the floor for 15 minutes. Sitter going downstairs with pt. Sitter to call us if pt gives her any trouble.

## 2012-08-18 NOTE — Progress Notes (Signed)
Patient allowed to be off the floor for 15 minutes, with her mother. Must be searched when she returns.

## 2012-08-18 NOTE — Progress Notes (Signed)
Pt returned to room with sitter 

## 2012-08-19 DIAGNOSIS — M3214 Glomerular disease in systemic lupus erythematosus: Secondary | ICD-10-CM

## 2012-08-19 HISTORY — DX: Glomerular disease in systemic lupus erythematosus: M32.14

## 2012-08-19 LAB — CBC
HCT: 31.4 % — ABNORMAL LOW (ref 36.0–46.0)
Hemoglobin: 10.6 g/dL — ABNORMAL LOW (ref 12.0–15.0)
MCH: 27 pg (ref 26.0–34.0)
MCHC: 33.8 g/dL (ref 30.0–36.0)
MCV: 80.1 fL (ref 78.0–100.0)
RDW: 13 % (ref 11.5–15.5)

## 2012-08-19 LAB — BASIC METABOLIC PANEL
BUN: 36 mg/dL — ABNORMAL HIGH (ref 6–23)
Calcium: 9.4 mg/dL (ref 8.4–10.5)
Creatinine, Ser: 8.53 mg/dL — ABNORMAL HIGH (ref 0.50–1.10)
GFR calc Af Amer: 6 mL/min — ABNORMAL LOW (ref >90)
GFR calc non Af Amer: 6 mL/min — ABNORMAL LOW (ref >90)
Glucose, Bld: 137 mg/dL — ABNORMAL HIGH (ref 70–99)
Potassium: 3.7 mEq/L (ref 3.5–5.1)

## 2012-08-19 NOTE — Progress Notes (Signed)
Subjective:  Still requesting to go home but more calm. Did get her solumedrol last night and is taking cellcept.  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    Objective Vital signs in last 24 hours: Filed Vitals:   08/18/12 1807 08/18/12 2100 08/19/12 0459 08/19/12 0951  BP: 140/94 144/100 137/102 140/103  Pulse: 104 94 105 110  Temp: 98 F (36.7 C) 98.7 F (37.1 C) 98.3 F (36.8 C) 98.6 F (37 C)  TempSrc: Oral Oral Oral Oral  Resp: 18 18 18 18   Height:  5\' 7"  (1.702 m)    Weight:  97.75 kg (215 lb 8 oz)    SpO2: 100% 99% 100% 100%   Weight change: 0.045 kg (1.6 oz)  Intake/Output Summary (Last 24 hours) at 08/19/12 1139 Last data filed at 08/18/12 2258  Gross per 24 hour  Intake    640 ml  Output    750 ml  Net   -110 ml   Labs: Basic Metabolic Panel:  Recent Labs Lab 08/17/12 0537 08/18/12 0605 08/19/12 0514  NA 139 142 135  K 3.1* 3.4* 3.7  CL 104 103 99  CO2 26 26 21   GLUCOSE 84 84 137*  BUN 30* 32* 36*  CREATININE 7.62* 8.21* 8.53*  CALCIUM 8.9 9.1 9.4   Liver Function Tests: No results found for this basename: AST, ALT, ALKPHOS, BILITOT, PROT, ALBUMIN,  in the last 168 hours No results found for this basename: LIPASE, AMYLASE,  in the last 168 hours No results found for this basename: AMMONIA,  in the last 168 hours CBC:  Recent Labs Lab 08/15/12 0635 08/16/12 0605 08/17/12 0537 08/18/12 0605 08/19/12 0514  WBC 5.8 5.3 5.4 4.6 4.3  HGB 10.0* 9.9* 9.5* 9.8* 10.6*  HCT 29.3* 28.7* 28.4* 28.8* 31.4*  MCV 80.7 81.5 80.9 80.2 80.1  PLT 208 207 215 239 259   Cardiac Enzymes: No results found for this basename: CKTOTAL, CKMB, CKMBINDEX, TROPONINI,  in the last 168 hours CBG: No results found for this basename: GLUCAP,  in the last 168 hours  Iron Studies: No results found for this basename: IRON, TIBC, TRANSFERRIN, FERRITIN,  in the last 72  hours Studies/Results: No results found. Medications: Infusions: . sodium chloride Stopped (08/09/12 1900)    Scheduled Medications: . amLODipine  10 mg Oral Daily  . calcitRIOL  0.25 mcg Oral Daily  . darbepoetin (ARANESP) injection - NON-DIALYSIS  100 mcg Subcutaneous Q Mon-1800  . furosemide  40 mg Oral BID  . haloperidol  5 mg Oral Q6H   Or  . haloperidol lactate  5 mg Intramuscular Q6H  . heparin  5,000 Units Subcutaneous Q8H  . LORazepam  1 mg Oral Q6H   Or  . LORazepam  1 mg Intramuscular Q6H  . methylPREDNISolone (SOLU-MEDROL) injection  750 mg Intravenous Daily  . mycophenolate  1,500 mg Oral BID  . nicotine  21 mg Transdermal Daily  . pantoprazole  40 mg Oral Daily    have reviewed scheduled and prn medications.  Physical Exam: General: alert, agitated slightly wants to get outside Heart: RRR  Lungs: clear Abdomen: obese, soft non tender Extremities: possibly some trace edema "they are not swollen, I am big boned"    Assessment/ Plan: Pt is a 32 y.o. yo female who was admitted on 08/08/2012 with subacute renal failure and behavioral issues  Assessment/Plan: 1. 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  S/p first dose of IV solumedrol, ideally would get three doses and then high dose pred starting on 5/17 and to continue high dose cellcept.  She will need occasional CBC and renal panel on this treatment.  Will not expect immediate results as far as creatinine, likely will take weeks to months.  2. HTN- on amlodipine 10 and lasix 40 BID-  no changes rec today 3. Anemia- stable for now- on ESA 4. Secondary hyperparathyroidism- phos and calc are OK- PTH over 300 have started vitamin D 5. Behavioral/psych issues- complicating all- dont know disposition- once IV solumedrol is done could cont to get pills at [facility] and labs ?   Sara [name] A   08/19/2012,11:39 AM  LOS: 11 days

## 2012-08-19 NOTE — Progress Notes (Signed)
FMTS Attending Daily Note:  Renold Don MD  [phone] pager  Family Practice pager:  [phone] I have discussed this patient with the resident [facility].  I agree with their findings, assessment, and care plan.  Unclear when she would be ready for transfer to [facility]. Overall, she is medically stable to go as long as she has nephrology FU.  She has good urine output despite rise in creatinine.  Question if psych can provide Cellcept/steroids IV at [facility] inpatient?

## 2012-08-19 NOTE — Progress Notes (Signed)
Patient ID: Sara [name], female   DOB: 09/13/1980, 32 y.o.   MRN: [id number] Family Medicine Teaching Service Daily Progress Note Service Page: [phone]  Subjective:  Patient has no complaints. No overnight events. Awaiting final result of biopsy, wanting to go outside for 15 minutes again if her mother comes by.   Objective: BP 137/102  Pulse 105  Temp(Src) 98.3 F (36.8 C) (Oral)  Resp 18  Ht 5\' 7"  (1.702 m)  Wt 215 lb 8 oz (97.75 kg)  BMI 33.74 kg/m2  SpO2 100%  Exam: General: NAD, laying comfortably in bed. Calm. Cardiovascular: rrr, no murmur Respiratory: CTAB Extremities: trace edema BLE Psych: calm, appropriate    CBC    Component Value Date/Time   WBC 4.3 08/19/2012 0514   RBC 3.92 08/19/2012 0514   HGB 10.6* 08/19/2012 0514   HCT 31.4* 08/19/2012 0514   PLT 259 08/19/2012 0514   MCV 80.1 08/19/2012 0514   MCH 27.0 08/19/2012 0514   MCHC 33.8 08/19/2012 0514   RDW 13.0 08/19/2012 0514   LYMPHSABS 2.2 08/08/2012 1610   MONOABS 0.3 08/08/2012 1610   EOSABS 0.0 08/08/2012 1610   BASOSABS 0.0 08/08/2012 1610   BMET    Component Value Date/Time   NA 135 08/19/2012 0514   K 3.7 08/19/2012 0514   CL 99 08/19/2012 0514   CO2 21 08/19/2012 0514   GLUCOSE 137* 08/19/2012 0514   BUN 36* 08/19/2012 0514   CREATININE 8.53* 08/19/2012 0514   CREATININE 4.37* 07/30/2012 1544   CALCIUM 9.4 08/19/2012 0514   GFRNONAA 6* 08/19/2012 0514   GFRAA 6* 08/19/2012 0514      US Renal 08/09/2012  *RADIOLOGY REPORT*  Clinical Data: Rapid progression of renal failure  RENAL/URINARY TRACT ULTRASOUND COMPLETE  Comparison:  None.  Findings:  Right Kidney:  Markedly increased renal parenchymal echogenicity. No hydronephrosis or solid renal lesion.  12.7 cm in length.  Left Kidney:  Markedly increased renal parenchymal echogenicity. No hydronephrosis or solid renal lesion.  12.1 cm in length.  Bladder: The bladder is under distended.  IMPRESSION:  Marked renal parenchymal echogenicity suggest underlying  medical renal disease.  No hydronephrosis.   Original Report Authenticated By: Malachy Moan, M.D.    Assessment/Plan: Sara [name] is a 32 y.o. year old female with long psych history including multiple IVC for psychotic behavior, HTN, proteinuria, and recent rapid elevation in Cr presenting as transfer from [facility] ED for evaluation of renal function/medical clearance prior to [facility] placement. [name] (Nephrology) has seen patient and started treatment of lupus nephritis with solumedrol and cellcept.   Renal: patient with rapidly progressing renal decline. Has had increase in Cr to 6.92 from 1.22 in just over 2 months in addition to proteinuria, hematuria, and edema. Subacute renal failure with nephrotic syndrome. + ANA and + Smith but - anti DNA. Greatly appreciate Nephrology consult and assistance with this case. 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 Patient was started on solumedrol and cellcept.  - Will patient be able to receive solumedrol IM at [facility] (If she would become stable enough to transfer)? - Will follow kidney function closely; Cr from this AM 8.21--> 8.53 - hyperparathyroid-with PTH >300-renal started on vitamin D  Psych: patient initially brought to ED for psych issues. Was irate and difficult to control, though following administration of geodon and ativan became more reasonable. Has had several episodes of outbursts and irate behavior,  though has been refusing any medications; tried to leave [facility] 5/9, but was calmed down and stayed.  -spoke with psych, appreciate their help, they have advised that we can administer medications without patients consent due to not having capacity-2 MDs have agreed on this  -will administer IM haldol 5 mg Q6 and IM ativan 1 mg  -sitter at bedside  - IVC renewed 5/11 --> will expire 5/18 - bed request for psych faxed out, awaiting  placement - Will patient be able to receive solumedrol IM at [facility]?   CV: with recent diagnosis of HTN. Recently started on ACE for HTN and renal benefit.  -appreciate renal's assistance with BP mgmt -increased norvasc to 10mg  and add lasix 40 BID as per renal rec 08/13/12 ; monitor  -hydralazine PO 10 mg prn for BP >160/110; not needed in past 24 hours -clonidine 0.1mg  po q4hr PRN BP > 170/110; not needed in past 24 hours    Heme:  baseline appears to be somewhere between 7.4 and 8.8  -will monitor on CBC; hgb stable at 10.6 today -anemia panel indicates anemia of chronic disease  -started on aranesp   FEN/GI: heart healthy diet Prophylaxis: SQ heparin  Disposition: patient is under IVC, will need to await biopsy and lab results before we able to transfer her to [facility]. Results are not expected until at least 5/14 per renal. Code Status: full  Felix Pacini, DO 08/19/2012, 8:59 AM

## 2012-08-20 DIAGNOSIS — N058 Unspecified nephritic syndrome with other morphologic changes: Secondary | ICD-10-CM

## 2012-08-20 DIAGNOSIS — M329 Systemic lupus erythematosus, unspecified: Principal | ICD-10-CM

## 2012-08-20 LAB — BASIC METABOLIC PANEL
Calcium: 9.5 mg/dL (ref 8.4–10.5)
GFR calc Af Amer: 6 mL/min — ABNORMAL LOW (ref >90)
GFR calc non Af Amer: 5 mL/min — ABNORMAL LOW (ref >90)
Glucose, Bld: 118 mg/dL — ABNORMAL HIGH (ref 70–99)
Potassium: 3.8 mEq/L (ref 3.5–5.1)
Sodium: 134 mEq/L — ABNORMAL LOW (ref 135–145)

## 2012-08-20 LAB — CBC
MCH: 27.7 pg (ref 26.0–34.0)
MCHC: 34.7 g/dL (ref 30.0–36.0)
Platelets: 277 10*3/uL (ref 150–400)
RDW: 13.3 % (ref 11.5–15.5)

## 2012-08-20 MED ORDER — PREDNISONE 20 MG PO TABS
40.0000 mg | ORAL_TABLET | Freq: Two times a day (BID) | ORAL | Status: DC
  Filled 2012-08-20 (×2): qty 2

## 2012-08-20 MED ORDER — PREDNISONE 20 MG PO TABS
40.0000 mg | ORAL_TABLET | Freq: Two times a day (BID) | ORAL | Status: DC
  Administered 2012-08-21 – 2012-08-23 (×6): 40 mg via ORAL
  Filled 2012-08-20 (×8): qty 2

## 2012-08-20 NOTE — Progress Notes (Signed)
Subjective:  Pt is s/p 2 doses of solumedrol but has refused the third and her IV is out.  Is taking her cellcept.     Objective Vital signs in last 24 hours: Filed Vitals:   08/19/12 1745 08/19/12 2133 08/20/12 0553 08/20/12 0815  BP: 143/95 131/90 136/90 132/84  Pulse: 53 92 81 78  Temp: 98.6 F (37 C) 98.6 F (37 C) 98.5 F (36.9 C) 97.8 F (36.6 C)  TempSrc: Oral Oral Oral Oral  Resp: 18 18 18 20   Height:      Weight:  97.75 kg (215 lb 8 oz)    SpO2: 100% 100% 100% 98%   Weight change: 0 kg (0 lb)  Intake/Output Summary (Last 24 hours) at 08/20/12 1121 Last data filed at 08/20/12 0833  Gross per 24 hour  Intake   1060 ml  Output      0 ml  Net   1060 ml   Labs: Basic Metabolic Panel:  Recent Labs Lab 08/18/12 0605 08/19/12 0514 08/20/12 0543  NA 142 135 134*  K 3.4* 3.7 3.8  CL 103 99 96  CO2 26 21 23   GLUCOSE 84 137* 118*  BUN 32* 36* 48*  CREATININE 8.21* 8.53* 9.05*  CALCIUM 9.1 9.4 9.5   Liver Function Tests: No results found for this basename: AST, ALT, ALKPHOS, BILITOT, PROT, ALBUMIN,  in the last 168 hours No results found for this basename: LIPASE, AMYLASE,  in the last 168 hours No results found for this basename: AMMONIA,  in the last 168 hours CBC:  Recent Labs Lab 08/16/12 0605 08/17/12 0537 08/18/12 0605 08/19/12 0514 08/20/12 0543  WBC 5.3 5.4 4.6 4.3 11.2*  HGB 9.9* 9.5* 9.8* 10.6* 9.6*  HCT 28.7* 28.4* 28.8* 31.4* 27.7*  MCV 81.5 80.9 80.2 80.1 79.8  PLT 207 215 239 259 277   Cardiac Enzymes: No results found for this basename: CKTOTAL, CKMB, CKMBINDEX, TROPONINI,  in the last 168 hours CBG: No results found for this basename: GLUCAP,  in the last 168 hours  Iron Studies: No results found for this basename: IRON, TIBC, TRANSFERRIN, FERRITIN,  in the last 72 hours Studies/Results: No results found. Medications: Infusions:    Scheduled Medications: . amLODipine  10 mg Oral Daily  . calcitRIOL  0.25 mcg Oral Daily  .  darbepoetin (ARANESP) injection - NON-DIALYSIS  100 mcg Subcutaneous Q Mon-1800  . furosemide  40 mg Oral BID  . haloperidol  5 mg Oral Q6H   Or  . haloperidol lactate  5 mg Intramuscular Q6H  . heparin  5,000 Units Subcutaneous Q8H  . LORazepam  1 mg Oral Q6H   Or  . LORazepam  1 mg Intramuscular Q6H  . mycophenolate  1,500 mg Oral BID  . nicotine  21 mg Transdermal Daily  . pantoprazole  40 mg Oral Daily  . predniSONE  40 mg Oral BID WC    have reviewed scheduled and prn medications.  Physical Exam: General: alert, agitated slightly wants to get outside Heart: RRR  Lungs: clear Abdomen: obese, soft non tender Extremities: possibly some trace edema "they are not swollen, I am big boned"    Assessment/ Plan: Pt is a 32 y.o. yo female who was admitted on 08/08/2012 with subacute renal failure and behavioral issues  Assessment/Plan: 1. 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  S/p 2 doses of IV solumedrol,will go ahead and transition to oral pred total  of 80 mg per day and to continue high dose cellcept.  If she is to stay here, would arrange CBC and renal panel in 4 days (Tuesday) does not need daily labs, could just do weekly after that unless there is change in her status.   Will not expect immediate results as far as creatinine, likely will take weeks to months. She is not uremic  2. HTN- on amlodipine 10 and lasix 40 BID-  no changes rec today 3. Anemia- stable for now- on ESA 4. Secondary hyperparathyroidism- phos and calc are OK- PTH over 300 have started vitamin D 5. Behavioral/psych issues- complicating all- dont know disposition- once IV solumedrol is done could cont to get pills at [facility] and labs ?  I will follow at a distance- continue cellcept 1500 BID and prednisone 40 BID as well as protonix and check weekly CBC and renal panel.  Keep me informed if she leaves the hospital...  Follow up with  Dr. Briant Cedar will be June 13 at 11:00 (pt to be there at 10:30)   Marcellas Marchant A   08/20/2012,11:21 AM  LOS: 12 days

## 2012-08-20 NOTE — Progress Notes (Addendum)
FMTS Attending Daily Note:  Renold Don MD  [phone] pager  Family Practice pager:  [phone] I have seen and examined this patient and have reviewed their chart. I have discussed this patient with the resident. I agree with the resident's findings, assessment and care plan.  Additionally:  Patient essentially stable from a medical standpoint.  Per renal, can follow outpt BMETs to follow kidney function.  Needs discharge to Behavioral Health -- hopeful this can happen today.  Will touch base with them.    Of note, I DID discuss these plans with patient.  She is not happy about DC to [facility], but understands this is the plan.    Tobey Grim, MD 08/20/2012 1:37 PM

## 2012-08-20 NOTE — Progress Notes (Signed)
Utilization review completed.  

## 2012-08-20 NOTE — Progress Notes (Signed)
08/20/2012 patient refuse her 1200 Ativan and Haldol and Dr Gwendolyn Grant is aware. [facility] RN.

## 2012-08-20 NOTE — Progress Notes (Signed)
Patient ID: Sara [name], female   DOB: Apr 08, 1980, 32 y.o.   MRN: [id number] Family Medicine Teaching Service Daily Progress Note Service Page: [phone]  Subjective:  Patient has no complaints. No overnight events. Wants to go home.  Objective: BP 136/90  Pulse 81  Temp(Src) 98.5 F (36.9 C) (Oral)  Resp 18  Ht 5\' 7"  (1.702 m)  Wt 215 lb 8 oz (97.75 kg)  BMI 33.74 kg/m2  SpO2 100%  Exam: General: NAD, laying comfortably in bed. Calm. Cardiovascular: rrr, no murmur Respiratory: CTAB Extremities: trace edema BLE Psych: calm, appropriate, slight slurring of words.   CBC    Component Value Date/Time   WBC 4.3 08/19/2012 0514   RBC 3.92 08/19/2012 0514   HGB 10.6* 08/19/2012 0514   HCT 31.4* 08/19/2012 0514   PLT 259 08/19/2012 0514   MCV 80.1 08/19/2012 0514   MCH 27.0 08/19/2012 0514   MCHC 33.8 08/19/2012 0514   RDW 13.0 08/19/2012 0514   LYMPHSABS 2.2 08/08/2012 1610   MONOABS 0.3 08/08/2012 1610   EOSABS 0.0 08/08/2012 1610   BASOSABS 0.0 08/08/2012 1610   BMET    Component Value Date/Time   NA 135 08/19/2012 0514   K 3.7 08/19/2012 0514   CL 99 08/19/2012 0514   CO2 21 08/19/2012 0514   GLUCOSE 137* 08/19/2012 0514   BUN 36* 08/19/2012 0514   CREATININE 8.53* 08/19/2012 0514   CREATININE 4.37* 07/30/2012 1544   CALCIUM 9.4 08/19/2012 0514   GFRNONAA 6* 08/19/2012 0514   GFRAA 6* 08/19/2012 0514      US Renal 08/09/2012  *RADIOLOGY REPORT*  Clinical Data: Rapid progression of renal failure  RENAL/URINARY TRACT ULTRASOUND COMPLETE  Comparison:  None.  Findings:  Right Kidney:  Markedly increased renal parenchymal echogenicity. No hydronephrosis or solid renal lesion.  12.7 cm in length.  Left Kidney:  Markedly increased renal parenchymal echogenicity. No hydronephrosis or solid renal lesion.  12.1 cm in length.  Bladder: The bladder is under distended.  IMPRESSION:  Marked renal parenchymal echogenicity suggest underlying medical renal disease.  No hydronephrosis.   Original  Report Authenticated By: Malachy Moan, M.D.    Assessment/Plan: Sara [name] is a 32 y.o. year old female with long psych history including multiple IVC for psychotic behavior, HTN, proteinuria, and recent rapid elevation in Cr presenting as transfer from [facility] ED for evaluation of renal function/medical clearance prior to [facility] placement. [name] (Nephrology) has seen patient and started treatment of lupus nephritis with solumedrol and cellcept.   Renal: patient with rapidly progressing renal decline. Subacute renal failure with nephrotic syndrome. + ANA and + Smith but - anti DNA. Greatly appreciate Nephrology consult and assistance with this case. 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 Patient was started on solumedrol and cellcept.  - 2:3 solumedrol treatments given - Will follow kidney function closely; Cr from this AM 8.21--> 9.05 - hyperparathyroid-with PTH >300-renal started on vitamin D  Psych: patient initially brought to ED for psych issues. Was irate and difficult to control, though following administration of geodon and ativan became more reasonable. Has had several episodes of outbursts and irate behavior, though has been refusing any medications; tried to leave [facility] 5/9, but was calmed down and stayed.  -spoke with psych, appreciate their help, they have advised that we can administer medications without patients consent due to not having capacity-2 MDs have agreed on this  -will  administer IM haldol 5 mg Q6 and IM ativan 1 mg  -sitter at bedside  - IVC renewed 5/11 --> will expire 5/18 - bed request for psych faxed out, awaiting placement - Will patient be able to receive solumedrol IM or labs (CBC, renal function) at [facility]?   CV: with recent diagnosis of HTN. Recently started on ACE for HTN and renal benefit.  -appreciate renal's assistance with BP  mgmt -increased norvasc to 10mg  and add lasix 40 BID as per renal rec 08/13/12 ; monitor  -hydralazine PO 10 mg prn for BP >160/110; not needed in past 24 hours -clonidine 0.1mg  po q4hr PRN BP > 170/110; not needed in past 24 hours    Heme:  baseline appears to be somewhere between 7.4 and 8.8  -will monitor on CBC; hgb stable at 9.6 today -anemia panel indicates anemia of chronic disease  -started on aranesp   FEN/GI: heart healthy diet Prophylaxis: SQ heparin  Disposition: patient is under IVC, will need to await biopsy and lab results before we able to transfer her to [facility]. Results are not expected until at least 5/14 per renal. Code Status: full  Felix Pacini, DO 08/20/2012, 6:26 AM

## 2012-08-21 LAB — BASIC METABOLIC PANEL
CO2: 24 mEq/L (ref 19–32)
Calcium: 9.4 mg/dL (ref 8.4–10.5)
GFR calc non Af Amer: 6 mL/min — ABNORMAL LOW (ref >90)
Glucose, Bld: 90 mg/dL (ref 70–99)
Potassium: 3.5 mEq/L (ref 3.5–5.1)
Sodium: 137 mEq/L (ref 135–145)

## 2012-08-21 LAB — CBC
Hemoglobin: 9.3 g/dL — ABNORMAL LOW (ref 12.0–15.0)
MCH: 27.2 pg (ref 26.0–34.0)
MCHC: 34.1 g/dL (ref 30.0–36.0)
Platelets: 281 10*3/uL (ref 150–400)
RBC: 3.42 MIL/uL — ABNORMAL LOW (ref 3.87–5.11)

## 2012-08-21 NOTE — Progress Notes (Signed)
Patient ID: Sara [name], female   DOB: 12-17-1980, 32 y.o.   MRN: [id number] Family Medicine Teaching Service Daily Progress Note Service Page: [phone]  Subjective:  Feels well this am. Tolerating PO. Making urine. AOX3. Would like to go home but understands the need to treat her renal and psychological conditions. Lost IV yesterday  Objective: BP 135/86  Pulse 71  Temp(Src) 99.2 F (37.3 C) (Oral)  Resp 20  Ht 5\' 7"  (1.702 m)  Wt 215 lb 8 oz (97.75 kg)  BMI 33.74 kg/m2  SpO2 100%  Exam: General: NAD, laying comfortably in bed. Calm but disheveled  Cardiovascular: rrr, no murmur Respiratory: CTAB, nml effort Extremities: trace edema BLE Psych: calm, affect a little flattened today, aware of medical conditions   Labs/Results:   Recent Labs Lab 08/19/12 0514 08/20/12 0543 08/21/12 0540  HGB 10.6* 9.6* 9.3*  HCT 31.4* 27.7* 27.3*  WBC 4.3 11.2* 13.4*  PLT 259 277 281    Recent Labs Lab 08/17/12 0537 08/18/12 0605 08/19/12 0514 08/20/12 0543 08/21/12 0540  NA 139 142 135 134* 137  K 3.1* 3.4* 3.7 3.8 3.5  CL 104 103 99 96 99  CO2 26 26 21 23 24   GLUCOSE 84 84 137* 118* 90  BUN 30* 32* 36* 48* 58*  CREATININE 7.62* 8.21* 8.53* 9.05* 8.44*  CALCIUM 8.9 9.1 9.4 9.5 9.4   Renal Bx: 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   Assessment/Plan: Sara [name] is a 32 y.o. year old female with long psych history including multiple IVC for psychotic behavior, HTN, proteinuria, and recent rapid elevation in Cr presenting as transfer from [facility] ED for evaluation of renal function/medical clearance prior to [facility] placement. [name] (Nephrology) has seen patient and started treatment of lupus nephritis with solumedrol and cellcept.   Renal: Newly dx lupus nephritis w/ acute onset ( + ANA and + Smith but - anti DNA, confirmed w/ renal bx). Stable renal function at this time as pt now started  on steroids and cellcept. Continues to make urine. Elevated BUN but no signs of uremia.  Subacute renal failure with nephrotic syndrome. Greatly appreciate Nephrology consult and assistance with this case. Patient was started on solumedrol and cellcept. 3/3 doses of solumedrol given now on prednisone. No treatment for uremia at this time -Cont Prednisone and cellcept per renal - f/u BMET on 5/24.  - hyperparathyroid-with PTH >300-renal started on vitamin D  Psych: Pt in desperate need of inpatient psychiatric admission. Pt now refusing Haldol and ativan at various times per nursing staff. Last psychology evaluation on 5/12. Pt now stable from a medical standpoint and renal agrees that pt need inpt psychiatric assistance. Of concern is that pt will again dteriorate psychologically and refuse her medications needed to treat her Lupus. As per previous psych eval we can administer medications without patients consent due to not having capacity-2 MDs have agreed on this. Appreciate psych teams assistance greatly.  - Spoke to Psych answering service and psych to reevaluate again today for admission - contineu Haldol and ativan -sitter at bedside  - Renew  IVS papers on 5/18  CV: with recent diagnosis of HTN. Recently started on ACE for HTN and renal benefit. BP well controlled at this time. -appreciate renal's assistance with BP mgmt -Continue Amlodipine,   Heme:  baseline appears to be somewhere between 7.4 and 8.8. 9.3 today. Likely anemia of chronic disease. Elevated WBC noted since starting  steroids. AFVSS. Steroids causing demarginalization are the likely culprit.  - f/u cbc on 5/24   FEN/GI: Tolerating PO.  - CHange to renal diet as in acute renal injury w/ elevated BUN  Prophylaxis: SQ heparin  Disposition: Pending Psych evaluation for liekly placement Code Status: full  Sara Flatten, MD Family Medicine PGY-2 08/21/2012, 9:43 AM

## 2012-08-21 NOTE — Consult Note (Signed)
  Sara [name] is a 32 y.o. female [id number] Jul 12, 1980  Subjective/Objective: Patient seen today.  She's compliant with her psychiatric medication with his Haldol 5 mg.  She remains under involuntary commitment due to too history of severe aggression and dangerous behavior.  Despite patient taking her psychiatric medication she remains very guarded hostile and easily irritable.  She continues to refused sometime her medication.  She requires sitter because of aggression and hostile behavior.  Psychiatric consultation liaison services recommend inpatient services for the management of her bipolar disorder. Nephrology recently seen and diagnosed with lupus nephritis.  Her BUN and creatinine remains very high however does not require to be seen every day.  Patient is now on prednisone and CellCept.  No further treatment for uremia at this time.  Mental status examination Patient today is calm and cooperative.  She maintained fair eye contact.  She remains guarded and labile.  She denies any hallucination or any suicidal thinking but remains easily irritable and angry.  She appears paranoid.  Her insight judgment and impulse control is limited.    Filed Vitals:   08/21/12 0945  BP: 128/90  Pulse: 99  Temp: 98 F (36.7 C)  Resp: 18    Lab Results:   BMET    Component Value Date/Time   NA 137 08/21/2012 0540   K 3.5 08/21/2012 0540   CL 99 08/21/2012 0540   CO2 24 08/21/2012 0540   GLUCOSE 90 08/21/2012 0540   BUN 58* 08/21/2012 0540   CREATININE 8.44* 08/21/2012 0540   CREATININE 4.37* 07/30/2012 1544   CALCIUM 9.4 08/21/2012 0540   GFRNONAA 6* 08/21/2012 0540   GFRAA 7* 08/21/2012 0540    Medications:  Scheduled:  . amLODipine  10 mg Oral Daily  . calcitRIOL  0.25 mcg Oral Daily  . darbepoetin (ARANESP) injection - NON-DIALYSIS  100 mcg Subcutaneous Q Mon-1800  . furosemide  40 mg Oral BID  . haloperidol  5 mg Oral Q6H   Or  . haloperidol lactate  5 mg Intramuscular Q6H  .  heparin  5,000 Units Subcutaneous Q8H  . LORazepam  1 mg Oral Q6H   Or  . LORazepam  1 mg Intramuscular Q6H  . mycophenolate  1,500 mg Oral BID  . nicotine  21 mg Transdermal Daily  . pantoprazole  40 mg Oral Daily  . predniSONE  40 mg Oral BID WC     PRN Meds acetaminophen, acetaminophen, cloNIDine, hydrALAZINE, ondansetron (ZOFRAN) IV, ondansetron, polyethylene glycol  Assessment Axis I bipolar disorder with psychotic features with history of noncompliance with medication and appeared hospitalization Axis II deferred Axis III Axis IV severe  Plan Recommended [facility] hospitalization as patient is still has multiple physical health issues.  Contact psych social worker to assist for [facility] admission.  Patient requires sitter for unpredictable and aggressive behavior.

## 2012-08-21 NOTE — Progress Notes (Signed)
Patient refused & 6am dose of Haldol & Ativan.  Patient said she hasn't had anything to eat.  Offered patient sandwich or chicken sandwich.  Patient refused.  She said she will take medication after breakfast.  Dr. Konrad Dolores notified.  Steele Berg RN

## 2012-08-21 NOTE — Progress Notes (Signed)
FMTS Attending Daily Note:  Renold Don MD  [phone] pager  Family Practice pager:  [phone] I have seen and examined this patient and have reviewed their chart. I have discussed this patient with the resident. I agree with the resident's findings, assessment and care plan.  Additionally:  Creatinine improved.  Stable for discharge from medical standpoint.  Awaiting psych input for transfer to behavioral health.   Tobey Grim, MD 08/21/2012 12:25 PM

## 2012-08-22 LAB — CBC
Hemoglobin: 10.2 g/dL — ABNORMAL LOW (ref 12.0–15.0)
Platelets: 360 10*3/uL (ref 150–400)
RBC: 3.75 MIL/uL — ABNORMAL LOW (ref 3.87–5.11)
WBC: 11.4 10*3/uL — ABNORMAL HIGH (ref 4.0–10.5)

## 2012-08-22 LAB — BASIC METABOLIC PANEL
CO2: 15 mEq/L — ABNORMAL LOW (ref 19–32)
Chloride: 96 mEq/L (ref 96–112)
Glucose, Bld: 112 mg/dL — ABNORMAL HIGH (ref 70–99)
Sodium: 132 mEq/L — ABNORMAL LOW (ref 135–145)

## 2012-08-22 MED ORDER — SODIUM BICARBONATE 650 MG PO TABS
650.0000 mg | ORAL_TABLET | Freq: Two times a day (BID) | ORAL | Status: DC
  Administered 2012-08-22 – 2012-08-23 (×3): 650 mg via ORAL
  Filled 2012-08-22 (×4): qty 1

## 2012-08-22 NOTE — Progress Notes (Signed)
Patient ID: Sara [name], female   DOB: 1980/07/27, 32 y.o.   MRN: [id number] Family Medicine Teaching Service Daily Progress Note Service Page: [phone]  Subjective:  No complaints this AM. Slept well and denies agitation. Sara [name] reports a positive outlook regarding recent diagnosis of lupus, Sara [name] has a close  friend who was diagnosed with lupus two years ago and is doing well. Sara [name] is amenable to in patient psych treatment. Sara [name] states that Sara [name] would not like to go to [facility] for treatment, because Sara [name] has no family there.   Objective: BP 137/89  Pulse 92  Temp(Src) 98.2 F (36.8 C) (Oral)  Resp 18  Ht 5\' 7"  (1.702 m)  Wt 215 lb 8 oz (97.75 kg)  BMI 33.74 kg/m2  SpO2 100%  Exam: General: NAD, laying comfortably in bed. Calm. Answers questions appropriately.  Cardiovascular: rrr, no murmur, rubs or gallops.  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   Assessment/Plan: Sara [name] is a 32 y.o. year old female with long psych history including multiple IVC for psychotic behavior, HTN, proteinuria, and recent rapid elevation in Cr presenting as transfer from [facility] ED for evaluation of renal function/medical clearance prior to [facility] placement. [name] (Nephrology) has seen patient and started treatment of lupus  nephritis with solumedrol and cellcept.   Renal: Newly dx lupus nephritis w/ acute onset ( + ANA and + Smith but - anti DNA, confirmed w/ renal bx).  Patient is  now started on steroids and Cellcept.  Slight decline in renal function since yesterday with acidemia and decreased urine output. Elevated BUN but no signs of uremia.  Subacute renal failure with nephrotic syndrome. Greatly appreciate Nephrology consult and assistance with this case. P:  Continue prednisone and cellcept.  Add oral bicarb to medication regimen. Continue to monitor urine output.  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 I touched bases with Dr. Kathrene Bongo with [facility] who recommends against regular blood draws unless patient's status changes clinically.    Psych: Pt in need of inpatient psychiatric admission due to lability.  Last psychiatry evaluation on 5/17, recommends med-psych. Of concern is that pt will again deteriorate psychologically and refuse her medications needed to treat her Lupus. As per previous psych eval we can administer medications without patients consent due to not having capacity-2 MDs have agreed on this. Appreciate psych teams assistance greatly.  - continue Haldol and ativan -sitter at bedside  - Renew  IVS papers today. -continue to search for placement.   CV: with recent diagnosis of HTN. Recently started on ACE for HTN and renal benefit. BP well controlled at this time. -appreciate renal's assistance with BP mgmt -Continue Amlodipine,   Heme:  baseline appears to  be somewhere between 7.4 and 8.8. 9.3 today. Likely anemia of chronic disease. Elevated WBC noted since starting steroids. AFVSS. Steroids causing demarginalization are the likely culprit.     FEN/GI: Tolerating PO.  - CHange to renal diet as in acute renal injury w/ elevated BUN  Prophylaxis: SQ heparin  Disposition: Pending Psych placement.  Code Status: full  Sara Phi MD Family  Medicine PGY-3 08/22/2012, 1:11 PM

## 2012-08-22 NOTE — Progress Notes (Signed)
FMTS Attending Daily Note:  Renold Don MD  [phone] pager  Family Practice pager:  [phone] I have seen and examined this patient and have reviewed their chart. I have discussed this patient with the resident. I agree with the resident's findings, assessment and care plan.  Additionally:  Issue is now placement.  Needs med/psych bed in that it sounds like she won't be able to leave for weekly blood draws from behavioral health or be able to go to outpatient appointments for her medical issues.  Patient refusing to go "anywhere outside of [place]."  Has been psychotic in past (this admit) but improved past 2 days.  May need to touch base with psych one last time regarding capacity if she remains adamant about no Med/Psych transfer.    [facility] has no beds.  We are broadening our search.   Tobey Grim, MD 08/22/2012 2:10 PM

## 2012-08-22 NOTE — Progress Notes (Signed)
Patient had 2 bags and a pocket book that her mother brought today in her room.  Patient took out the items she wanted to keep in her room and the others will be sent to security.  Patient very upset d/t she was told multiple times that her cigarettes and lighter were lost.  Cigarettes and both lighters located and will be sent to security with other items mentioned.  Patient also upset because the order for her to go outside with her mother to smoke had been discontinued.  Patient wanted to speak with the MD.  MD notified and will come and speak with her. Steele Berg RN

## 2012-08-22 NOTE — Progress Notes (Signed)
Patient stated that she is tired and wants to go to sleep and asked me to tell the MD not to come tonight but that she wanted to speak with them in the morning.  MD notified. Steele Berg RN

## 2012-08-22 NOTE — Progress Notes (Signed)
CSW and CSW Verlon Au spoke with pt at bedside.  Pt is agreeable to begin psychiatric treatment at this time. Pt denies homicidal/suicidal ideations at this time.  She wants to go home, but understands MD is recommending IP tx prior to d/c home.  CSW does not believe pt is committable at this time.  Called [facility] and spoke with Tom who stated that psychiatry was recommending more of a med-psych unit and that [facility] was not the appropriate venue for pt's disposition.  [facility] recommending [facility] and [address].  CSW has contacted both of these facilities and have no available beds today, but encouraged CSW to f/u in am.  RN indormed and will update Psych CSW in the am.

## 2012-08-23 MED ORDER — PREDNISONE 20 MG PO TABS: 40.0000 mg | ORAL_TABLET | Freq: Two times a day (BID) | ORAL | Status: DC

## 2012-08-23 MED ORDER — CALCITRIOL 0.25 MCG PO CAPS: 0.2500 ug | ORAL_CAPSULE | Freq: Every day | ORAL | Status: DC

## 2012-08-23 MED ORDER — HALOPERIDOL 5 MG PO TABS: 5.0000 mg | ORAL_TABLET | Freq: Four times a day (QID) | ORAL | Status: DC

## 2012-08-23 MED ORDER — PANTOPRAZOLE SODIUM 40 MG PO TBEC: 40.0000 mg | DELAYED_RELEASE_TABLET | Freq: Every day | ORAL | Status: DC

## 2012-08-23 MED ORDER — SODIUM BICARBONATE 650 MG PO TABS: 650.0000 mg | ORAL_TABLET | Freq: Two times a day (BID) | ORAL | Status: DC

## 2012-08-23 MED ORDER — AMLODIPINE BESYLATE 10 MG PO TABS: 10.0000 mg | ORAL_TABLET | Freq: Every day | ORAL | Status: DC

## 2012-08-23 MED ORDER — MYCOPHENOLATE MOFETIL 250 MG PO CAPS: 1500.0000 mg | ORAL_CAPSULE | Freq: Two times a day (BID) | ORAL | Status: DC

## 2012-08-23 MED ORDER — FUROSEMIDE 40 MG PO TABS: 40.0000 mg | ORAL_TABLET | Freq: Two times a day (BID) | ORAL | Status: DC

## 2012-08-23 NOTE — Progress Notes (Signed)
FMTS Attending Daily Note:  Renold Don MD  [phone] pager  Family Practice pager:  [phone] I have seen and examined this patient and have reviewed their chart. I have discussed this patient with the resident. I agree with the resident's findings, assessment and care plan.  Additionally:  Patient essentially stable from medical standpoint.  I recognize she has multiple medical issues, but these are rapidly becoming chronic rather than acute issues.  Would Psych be able to obtain weekly CBCs and BMETs -- if so, not sure that she actually needs a Med/Psych bed.  Appreciate psych input  Tobey Grim, MD 08/23/2012 4:08 PM

## 2012-08-23 NOTE — Clinical Social Work Psych Note (Signed)
Psych CSW reviewed chart and handoff from w/e CSW/psychiatrist.  Psych CSW reviewed pt with [facility].  According to [facility]- "according to [facility] psychiatrist documentation, pt does not meet criteria for inpatient psych tx- not suicidal, not homicidal, not currently manic etc.."  Psych CSW called [facility] (spoke with Fannie Knee) to request MD Arfeen (psychiatrist) to further clarify pt psychiatric needs.  At this point, pt is NOT IVC'd.  Pt is agreeable to tx, but would like to go home.  Psychiatrist to assist with further dispositioning/ documenting pt psych needs.  Vickii Penna, LCSWA [phone]  Clinical Social Work

## 2012-08-23 NOTE — Progress Notes (Signed)
08/23/2012 patient left at 2015, paperwork went over with patient and her mother over the phone. Patient was told about her appointments with PCP, renal MD and lab work on Friday 23 with Family Medicine. Patient was picked up by her brother and she was stable when left. [facility] RN.

## 2012-08-23 NOTE — Progress Notes (Signed)
Utilization review completed.  

## 2012-08-23 NOTE — Consult Note (Signed)
  Sara [name] is a 32 y.o. female [id number] [birth year]  Subjective/Objective: Patient was seen today along with psych social worker.  She's compliant with her psychiatric medication with Haldol 5 mg.  and also medication for medical conditions. Patient has been calm and cooperative with the staff. Patient does not exhibiting any symptoms of for irritability agitation and severe aggression or dangerous behavior. Psychiatric consultation liaison services recommend out patient psychiatric services for the medication management of her bipolar disorder. Nephrology recently seen and diagnosed with lupus nephritis.  Her BUN and creatinine remains high blood reportedly stable. Patient is now on prednisone and CellCept and compliant with medication without adverse effects.  She does not need inpatient medication management care for lupus nephritis as per the physician's.  Mental status examination Patient is calm, quiet, pleasant and cooperative. Patient has no abnormal psychomotor activity and has maintained fair eye contact.  She has normal rate rhythm and volume of speech and thought processes linear and goal-directed.  She denied suicidal onset ideation intentions or plans says no evidence of psychotic symptoms. Her insight judgment and impulse control is fair.    Filed Vitals:   08/23/12 1353  BP: 148/88  Pulse: 88  Temp: 98 F (36.7 C)  Resp: 20    Lab Results:   BMET    Component Value Date/Time   NA 132* 08/22/2012 0959   K 4.3 08/22/2012 0959   CL 96 08/22/2012 0959   CO2 15* 08/22/2012 0959   GLUCOSE 112* 08/22/2012 0959   BUN 63* 08/22/2012 0959   CREATININE 6.87* 08/22/2012 0959   CREATININE 4.37* 07/30/2012 1544   CALCIUM 9.2 08/22/2012 0959   GFRNONAA 7* 08/22/2012 0959   GFRAA 8* 08/22/2012 0959    Medications:  Scheduled:  . amLODipine  10 mg Oral Daily  . calcitRIOL  0.25 mcg Oral Daily  . darbepoetin (ARANESP) injection - NON-DIALYSIS  100 mcg Subcutaneous Q Mon-1800  .  furosemide  40 mg Oral BID  . haloperidol  5 mg Oral Q6H   Or  . haloperidol lactate  5 mg Intramuscular Q6H  . heparin  5,000 Units Subcutaneous Q8H  . LORazepam  1 mg Oral Q6H   Or  . LORazepam  1 mg Intramuscular Q6H  . mycophenolate  1,500 mg Oral BID  . nicotine  21 mg Transdermal Daily  . pantoprazole  40 mg Oral Daily  . predniSONE  40 mg Oral BID WC  . sodium bicarbonate  650 mg Oral BID     PRN Meds acetaminophen, acetaminophen, cloNIDine, hydrALAZINE, ondansetron (ZOFRAN) IV, ondansetron, polyethylene glycol  Assessment Axis I bipolar disorder with psychotic features with history of noncompliance with medication and appeared hospitalization Axis II deferred Axis III Axis IV severe  Plan Recommended outpatient psychiatric services as she is compliant with her medication management and willing to participate in her therapies as scheduled for both medical and mental health. She is on voluntary status as involuntary status expired few days ago. Psych Child psychotherapist to assist for disposition plans when medically cleared. Discontinue sitter.

## 2012-08-23 NOTE — Clinical Social Work Psych Note (Addendum)
Psych CSW met with pt at bedside along with psychiatrist.  Psych CSW contact pt mother, [place] Mon at [phone] who states she will assist pt with her appointments as much as she can and continue to encourage pt to take her meds.  Pt mother states that she will seek medical help if she is non-compliant in the future.    Vickii Penna, LCSWA [phone]  Clinical Social Work

## 2012-08-23 NOTE — Progress Notes (Signed)
Patient ID: Sara [name], female   DOB: 16-Jun-1980, 32 y.o.   MRN: [id number] Family Medicine Teaching Service Daily Progress Note Service Page: [phone]  Subjective:  She reports she wants to go home.  Objective: BP 148/88  Pulse 88  Temp(Src) 98 F (36.7 C) (Oral)  Resp 20  Ht 5\' 9"  (1.753 m)  Wt 222 lb 0.1 oz (100.7 kg)  BMI 32.77 kg/m2  SpO2 100%  Exam: General: NAD, laying comfortably in bed. Answers questions appropriately. Mildly agitated Cardiovascular: rrr, no murmur, rubs or gallops.  Respiratory: CTAB, nml effort Extremities: trace edema BLE Psych: mildly agitated. Wants to go home.   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   Assessment/Plan: Sara [name] is a 32 y.o. year old female with long psych history including multiple IVC for psychotic behavior, HTN, proteinuria, and recent rapid elevation in Cr presenting as transfer from [facility] ED for evaluation of renal function/medical clearance prior to [facility] placement. [name] (Nephrology) has seen patient and started treatment of lupus nephritis with solumedrol and cellcept, patient currently on oral medications only and is stable for discharge, pending psychiatry.   Renal: Newly dx lupus nephritis w/ acute onset ( + ANA and + Smith but - anti DNA, confirmed w/ renal bx).  Patient is  now started on steroids and Cellcept.  Slight decline in renal  function since yesterday with acidemia and decreased urine output. Elevated BUN but no signs of uremia.  Subacute renal failure with nephrotic syndrome. Greatly appreciate Nephrology consult and assistance with this case, they are falling from a distance.  - Continue prednisone and cellcept.  - Added oral bicarb to medication regimen. - Continue to monitor urine output.  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 - Dr. Kathrene Bongo with [facility]  recommends against regular blood draws unless patient's status changes clinically. Will check CBC and renal function Q week, as she is instructed in psych/outpatient setting.    Psych: Pt in need of inpatient psychiatric admission due to lability.  Last psychiatry evaluation on 5/17, recommends med-psych. Of concern is that pt will again deteriorate psychologically and refuse her medications needed to treat her Lupus. As per previous psych eval we can administer medications without patients consent due to not having capacity-2 MDs have agreed on this. Appreciate psych teams assistance greatly.  - continue Haldol and ativan - sitter at bedside  -continue to search for placement--> need med-psych, patient apparently not meeting "inpatient" status for psych because currently not SI/HI or manic.  - Patient is cleared medically at this point, will need CBC with renal function weekly.   CV: with recent diagnosis of HTN. Recently started on ACE for HTN and renal benefit. BP well controlled at this time. -appreciate renal's assistance with BP mgmt -Continue Amlodipine   Heme:  baseline appears to be somewhere between 7.4 and 8.8. 9.3 today. Likely anemia of chronic disease. Elevated WBC noted since starting steroids. AFVSS. Steroids causing demarginalization are the likely culprit.     FEN/GI: Tolerating PO.  - CHange to renal diet as in acute renal injury w/ elevated BUN  Prophylaxis: SQ heparin  Disposition: Pending Psych  placement.  Code Status: full  Dessa Phi MD Family Medicine PGY-3 08/23/2012, 3:23 PM

## 2012-08-26 NOTE — Discharge Summary (Signed)
Family Medicine Teaching Service  Discharge Note : Attending Jeff Geroge Gilliam MD Pager [phone] Inpatient Team Pager:  [phone]  I have reviewed this patient and the patient's chart and have discussed discharge planning with the resident at the time of discharge. I agree with the discharge plan as above.    

## 2012-08-27 ENCOUNTER — Ambulatory Visit (INDEPENDENT_AMBULATORY_CARE_PROVIDER_SITE_OTHER): Payer: Medicare HMO | Admitting: Family Medicine

## 2012-08-27 ENCOUNTER — Encounter: Payer: Self-pay | Admitting: Family Medicine

## 2012-08-27 ENCOUNTER — Telehealth: Payer: Self-pay | Admitting: Family Medicine

## 2012-08-27 VITALS — BP 152/101 | HR 72 | Temp 97.8°F | Ht 67.0 in | Wt 227.5 lb

## 2012-08-27 DIAGNOSIS — F29 Unspecified psychosis not due to a substance or known physiological condition: Secondary | ICD-10-CM

## 2012-08-27 DIAGNOSIS — N760 Acute vaginitis: Secondary | ICD-10-CM

## 2012-08-27 DIAGNOSIS — M3214 Glomerular disease in systemic lupus erythematosus: Secondary | ICD-10-CM

## 2012-08-27 DIAGNOSIS — M329 Systemic lupus erythematosus, unspecified: Secondary | ICD-10-CM

## 2012-08-27 DIAGNOSIS — I10 Essential (primary) hypertension: Secondary | ICD-10-CM

## 2012-08-27 DIAGNOSIS — N058 Unspecified nephritic syndrome with other morphologic changes: Secondary | ICD-10-CM

## 2012-08-27 LAB — POCT WET PREP (WET MOUNT)

## 2012-08-27 LAB — RENAL FUNCTION PANEL
CO2: 23 mEq/L (ref 19–32)
Glucose, Bld: 82 mg/dL (ref 70–99)
Potassium: 3.1 mEq/L — ABNORMAL LOW (ref 3.5–5.3)
Sodium: 138 mEq/L (ref 135–145)

## 2012-08-27 LAB — CBC
HCT: 29.7 % — ABNORMAL LOW (ref 36.0–46.0)
Hemoglobin: 10 g/dL — ABNORMAL LOW (ref 12.0–15.0)
MCH: 27 pg (ref 26.0–34.0)
MCV: 80.1 fL (ref 78.0–100.0)
RBC: 3.71 MIL/uL — ABNORMAL LOW (ref 3.87–5.11)

## 2012-08-27 MED ORDER — HYDROCORTISONE 2.5 % RE CREA: TOPICAL_CREAM | Freq: Two times a day (BID) | RECTAL | Status: DC

## 2012-08-27 MED ORDER — FLUCONAZOLE 150 MG PO TABS: 150.0000 mg | ORAL_TABLET | Freq: Once | ORAL | Status: DC

## 2012-08-27 NOTE — Telephone Encounter (Signed)
Left voicemail, wet prep showed Yeast infection, fluconazole called into pharmacy should clear it up.  Advised to call with questions.

## 2012-08-27 NOTE — Progress Notes (Signed)
Gave pt copy of lab results - protein/creat ration and IFE, urine with total protein BAJORDAN, MSL

## 2012-08-27 NOTE — Assessment & Plan Note (Signed)
Poorly controlled, stressed importance of medication compliance.

## 2012-08-27 NOTE — Patient Instructions (Addendum)
It was nice to meet you.  Please be sure to continue to take all your medications daily. Please make an appointment to see Dr. De Nurse in about 2 weeks. Please keep your appointment at the Kidney doctor.    The hospital team also felt it was important for you to see a mental health specialist.  I recommend calling the [facility]:  [address] [place], [place] [phone]

## 2012-08-27 NOTE — Assessment & Plan Note (Signed)
Swelling has not worsened since discharge- will check renal function panel and CBC.  Again, stressed importance of medication compliance, discussed possibility of renal failure and dialysis if she did not take her medications properly.

## 2012-08-27 NOTE — Progress Notes (Signed)
  Subjective:    Patient ID: Sara [name], female    DOB: 1981/01/11, 32 y.o.   MRN: [id number]  HPI:  Sara [name] comes in for hospital follow up.  She is not doing very well. She complains of constipation and hemorrhoid pain, and vaginal itching that have been bothering her since coming home from the hospital.   She asks repeatedly if she has to take all the medications she is taking, she says they make her not feel well and make her too hungry.    She says she did not take her blood pressure pills (or any other pills this am) but has been taking them most days.  Says swelling in legs much improved.   Past Medical History  Diagnosis Date  . Psychosis   . Hypertension   . Lupus   . Lupus nephritis     History  Substance Use Topics  . Smoking status: Current Every Day Smoker -- 0.50 packs/day    Types: Cigarettes  . Smokeless tobacco: Not on file  . Alcohol Use: 4.2 oz/week    4 Cans of beer, 3 Shots of liquor per week    Family History  Problem Relation Age of Onset  . Drug abuse Father      ROS Pertinent items in HPI    Objective:  Physical Exam:  BP 152/101  Pulse 72  Temp(Src) 97.8 F (36.6 C) (Oral)  Ht 5\' 7"  (1.702 m)  Wt 227 lb 8 oz (103.193 kg)  BMI 35.62 kg/m2 General appearance: alert, cooperative and no distress Head: Normocephalic, without obvious abnormality, atraumatic Lungs: clear to auscultation bilaterally Heart: regular rate and rhythm, S1, S2 normal, no murmur, click, rub or gallop Pulses: 2+ and symmetric Genital: Normal external and internal genitalia without discharge, external hemorrhoid at anus Pscyh: Speech is slightly pressured and thought content tangential, judgement and insight very poor, however patient does not appear to be hallucinating right now, denies voices.        Assessment & Plan:

## 2012-08-27 NOTE — Assessment & Plan Note (Signed)
Spent a great deal of time discussing EACH medication's reason and importance, including Haldol, which I stressed she should continue to take.  I recommended she follow up with [facility] for mental health. F/U with PCP in 1-2 weeks.

## 2012-08-29 ENCOUNTER — Emergency Department (HOSPITAL_COMMUNITY)
Admission: EM | Admit: 2012-08-29 | Discharge: 2012-08-30 | Disposition: A | Discharge status: Home / Self Care | Payer: Medicare HMO | Attending: Emergency Medicine | Admitting: Emergency Medicine

## 2012-08-29 ENCOUNTER — Encounter (HOSPITAL_COMMUNITY): Payer: Self-pay | Admitting: *Deleted

## 2012-08-29 DIAGNOSIS — F29 Unspecified psychosis not due to a substance or known physiological condition: Secondary | ICD-10-CM | POA: Insufficient documentation

## 2012-08-29 DIAGNOSIS — R197 Diarrhea, unspecified: Secondary | ICD-10-CM | POA: Insufficient documentation

## 2012-08-29 DIAGNOSIS — K644 Residual hemorrhoidal skin tags: Secondary | ICD-10-CM | POA: Insufficient documentation

## 2012-08-29 DIAGNOSIS — K6289 Other specified diseases of anus and rectum: Secondary | ICD-10-CM | POA: Insufficient documentation

## 2012-08-29 DIAGNOSIS — Z79899 Other long term (current) drug therapy: Secondary | ICD-10-CM | POA: Insufficient documentation

## 2012-08-29 DIAGNOSIS — I10 Essential (primary) hypertension: Secondary | ICD-10-CM | POA: Insufficient documentation

## 2012-08-29 DIAGNOSIS — F17200 Nicotine dependence, unspecified, uncomplicated: Secondary | ICD-10-CM | POA: Insufficient documentation

## 2012-08-29 DIAGNOSIS — M329 Systemic lupus erythematosus, unspecified: Secondary | ICD-10-CM | POA: Insufficient documentation

## 2012-08-29 DIAGNOSIS — N058 Unspecified nephritic syndrome with other morphologic changes: Secondary | ICD-10-CM | POA: Insufficient documentation

## 2012-08-29 DIAGNOSIS — Z3202 Encounter for pregnancy test, result negative: Secondary | ICD-10-CM | POA: Insufficient documentation

## 2012-08-29 LAB — CBC WITH DIFFERENTIAL/PLATELET
Eosinophils Absolute: 0.2 10*3/uL (ref 0.0–0.7)
Eosinophils Relative: 1 % (ref 0–5)
Lymphs Abs: 4.3 10*3/uL — ABNORMAL HIGH (ref 0.7–4.0)
MCH: 26.6 pg (ref 26.0–34.0)
MCV: 79.7 fL (ref 78.0–100.0)
Platelets: 277 10*3/uL (ref 150–400)
RBC: 3.35 MIL/uL — ABNORMAL LOW (ref 3.87–5.11)
RDW: 14.5 % (ref 11.5–15.5)

## 2012-08-29 LAB — BASIC METABOLIC PANEL
BUN: 74 mg/dL — ABNORMAL HIGH (ref 6–23)
CO2: 19 mEq/L (ref 19–32)
Calcium: 8.8 mg/dL (ref 8.4–10.5)
Creatinine, Ser: 4.92 mg/dL — ABNORMAL HIGH (ref 0.50–1.10)
Glucose, Bld: 89 mg/dL (ref 70–99)

## 2012-08-29 LAB — URINE MICROSCOPIC-ADD ON

## 2012-08-29 LAB — URINALYSIS, ROUTINE W REFLEX MICROSCOPIC
Glucose, UA: NEGATIVE mg/dL
Ketones, ur: NEGATIVE mg/dL
Protein, ur: >300 mg/dL — AB

## 2012-08-29 NOTE — ED Notes (Addendum)
Recently dx with Lupus, started on new meds for htn and kidney problems, now has ? Adverse reaction including diarrhea resulting in hemorrhoids
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
## 2012-09-07 ENCOUNTER — Inpatient Hospital Stay (HOSPITAL_COMMUNITY)
Admission: AD | Admit: 2012-09-07 | Discharge: 2012-09-08 | Disposition: A | Discharge status: Home / Self Care | Payer: Medicare HMO | Attending: Obstetrics & Gynecology | Admitting: Obstetrics & Gynecology

## 2012-09-07 DIAGNOSIS — K649 Unspecified hemorrhoids: Secondary | ICD-10-CM

## 2012-09-07 DIAGNOSIS — R197 Diarrhea, unspecified: Secondary | ICD-10-CM

## 2012-09-07 DIAGNOSIS — N949 Unspecified condition associated with female genital organs and menstrual cycle: Secondary | ICD-10-CM | POA: Insufficient documentation

## 2012-09-07 DIAGNOSIS — N390 Urinary tract infection, site not specified: Secondary | ICD-10-CM

## 2012-09-07 DIAGNOSIS — L988 Other specified disorders of the skin and subcutaneous tissue: Secondary | ICD-10-CM

## 2012-09-07 DIAGNOSIS — N898 Other specified noninflammatory disorders of vagina: Secondary | ICD-10-CM

## 2012-09-07 NOTE — MAU Note (Signed)
Pt not in lobby.  

## 2012-09-08 ENCOUNTER — Encounter (HOSPITAL_COMMUNITY): Payer: Self-pay | Admitting: *Deleted

## 2012-09-08 ENCOUNTER — Other Ambulatory Visit: Payer: Self-pay | Admitting: Family Medicine

## 2012-09-08 DIAGNOSIS — L988 Other specified disorders of the skin and subcutaneous tissue: Secondary | ICD-10-CM

## 2012-09-08 LAB — WET PREP, GENITAL
Clue Cells Wet Prep HPF POC: NONE SEEN
Trich, Wet Prep: NONE SEEN

## 2012-09-08 LAB — URINALYSIS, ROUTINE W REFLEX MICROSCOPIC
Nitrite: NEGATIVE
Protein, ur: >300 mg/dL — AB
Urobilinogen, UA: 0.2 mg/dL (ref 0.0–1.0)

## 2012-09-08 LAB — COMPREHENSIVE METABOLIC PANEL
AST: 7 U/L (ref 0–37)
Albumin: 2.6 g/dL — ABNORMAL LOW (ref 3.5–5.2)
Alkaline Phosphatase: 49 U/L (ref 39–117)
Chloride: 105 mEq/L (ref 96–112)
Potassium: 4.3 mEq/L (ref 3.5–5.1)
Total Bilirubin: 0.1 mg/dL — ABNORMAL LOW (ref 0.3–1.2)
Total Protein: 5.6 g/dL — ABNORMAL LOW (ref 6.0–8.3)

## 2012-09-08 LAB — CBC
Platelets: 142 10*3/uL — ABNORMAL LOW (ref 150–400)
RDW: 15.8 % — ABNORMAL HIGH (ref 11.5–15.5)
WBC: 10.3 10*3/uL (ref 4.0–10.5)

## 2012-09-08 LAB — URINE MICROSCOPIC-ADD ON

## 2012-09-08 LAB — POCT PREGNANCY, URINE: Preg Test, Ur: NEGATIVE

## 2012-09-08 MED ORDER — CIPROFLOXACIN HCL 250 MG PO TABS: 250.0000 mg | ORAL_TABLET | Freq: Every day | ORAL | Status: DC

## 2012-09-08 MED ORDER — VITAMINS A & D EX OINT
TOPICAL_OINTMENT | CUTANEOUS | Status: DC | PRN
  Filled 2012-09-08: qty 5

## 2012-09-08 MED ORDER — CIPROFLOXACIN HCL 500 MG PO TABS
500.0000 mg | ORAL_TABLET | Freq: Once | ORAL | Status: AC
  Administered 2012-09-08: 500 mg via ORAL
  Filled 2012-09-08: qty 1

## 2012-09-08 MED ORDER — CIPROFLOXACIN HCL 500 MG PO TABS: 250.0000 mg | ORAL_TABLET | Freq: Every day | ORAL | Status: DC

## 2012-09-08 MED ORDER — NYSTATIN 100000 UNIT/GM EX POWD
Freq: Once | CUTANEOUS | Status: AC
  Administered 2012-09-08: 03:00:00 via TOPICAL
  Filled 2012-09-08: qty 15

## 2012-09-08 NOTE — MAU Note (Signed)
Pt states she had a hemmeroid since 09/02/2012,  and it hurts when she passes urine and has a burning when she urinates. and she has been using wipes that make it burn on her behind and pt states she has a rash. Pt states she just started taking predisone for her Lupus.. "about 2 1/2 wks. Pt states she has a heavy discharge with a bad odor
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
## 2012-09-09 LAB — GC/CHLAMYDIA PROBE AMP: GC Probe RNA: NEGATIVE

## 2012-09-13 ENCOUNTER — Encounter: Payer: Self-pay | Admitting: Family Medicine

## 2012-09-13 ENCOUNTER — Ambulatory Visit (INDEPENDENT_AMBULATORY_CARE_PROVIDER_SITE_OTHER): Payer: Medicare HMO | Admitting: Family Medicine

## 2012-09-13 VITALS — BP 143/88 | HR 84 | Temp 98.5°F | Wt 255.0 lb

## 2012-09-13 DIAGNOSIS — M3214 Glomerular disease in systemic lupus erythematosus: Secondary | ICD-10-CM

## 2012-09-13 DIAGNOSIS — M329 Systemic lupus erythematosus, unspecified: Secondary | ICD-10-CM

## 2012-09-13 DIAGNOSIS — R609 Edema, unspecified: Secondary | ICD-10-CM

## 2012-09-13 DIAGNOSIS — N058 Unspecified nephritic syndrome with other morphologic changes: Secondary | ICD-10-CM

## 2012-09-13 LAB — CBC
HCT: 25.4 % — ABNORMAL LOW (ref 36.0–46.0)
Hemoglobin: 8.2 g/dL — ABNORMAL LOW (ref 12.0–15.0)
MCH: 26.4 pg (ref 26.0–34.0)
MCHC: 32.3 g/dL (ref 30.0–36.0)
MCV: 81.7 fL (ref 78.0–100.0)
Platelets: 237 K/uL (ref 150–400)
RBC: 3.11 MIL/uL — ABNORMAL LOW (ref 3.87–5.11)
RDW: 16 % — ABNORMAL HIGH (ref 11.5–15.5)
WBC: 8.7 K/uL (ref 4.0–10.5)

## 2012-09-13 NOTE — Patient Instructions (Addendum)
Nice to see you again.  Sorry your legs are so swollen. This is most likely related to your kidney disease. We will increase your lasix to 80 mg twice a day. This means take 2 pills twice a day. Please elevate your legs as well, this will help with the fluid. We will call you if your labs are abnormal. Please follow-up with your kidney doctor this Friday.

## 2012-09-14 DIAGNOSIS — R609 Edema, unspecified: Secondary | ICD-10-CM | POA: Insufficient documentation

## 2012-09-14 LAB — RENAL FUNCTION PANEL
Albumin: 2.8 g/dL — ABNORMAL LOW (ref 3.5–5.2)
CO2: 21 mEq/L (ref 19–32)
Calcium: 9 mg/dL (ref 8.4–10.5)
Creat: 4.51 mg/dL — ABNORMAL HIGH (ref 0.50–1.10)
Glucose, Bld: 75 mg/dL (ref 70–99)
Sodium: 141 mEq/L (ref 135–145)

## 2012-09-14 NOTE — Assessment & Plan Note (Addendum)
Patient with known lupus nephritis diagnosed at recent hospitalization. On prednisone and cellcept. Also on lasix 40 mg PO BID. Has recent increase in LE edema with weight gain of 34 pounds. Edema and weight gain most likely related to renal disease, less likely related to cardiovascular process as with no additional findings. Plan: will proceed with renal f/u. Increase lasix to 80 mg PO BID. Cr slightly improved on renal function panel to 4.51 so should tolerate increased lasix. Advised to elevate feet and minimize salt intake. Will f/u in 2 weeks.

## 2012-09-14 NOTE — Assessment & Plan Note (Signed)
Please see lupus nephritis for plan.

## 2012-09-14 NOTE — Progress Notes (Signed)
  Subjective:    Patient ID: Sara [name], female    DOB: 08/19/1980, 32 y.o.   MRN: [id number]  HPI Patient is a 32 yo female who presents for f/u of lupus nephritis and swelling.  Patient presents with bilateral LE edema. Started to increase last Sunday. Denies pain. Was previously at baseline. Is taking lasix 40 mg PO BID. States peeing every 2-3 hours, though sometimes only a dribble. Is typically less swollen in the am. Denies CP and SOB. Notably most recent Cr 5.5, which is slightly up from previous check of 4.92. Is not watching how much salt she takes in. Has appointment with renal on Friday.  Review of Systems see HPI     Objective:   Physical Exam  Constitutional: She appears well-developed and well-nourished. No distress.  HENT:  Head: Normocephalic and atraumatic.  Cardiovascular: Normal rate, regular rhythm and normal heart sounds.  Exam reveals no gallop and no friction rub.   No murmur heard. Pulmonary/Chest: Effort normal and breath sounds normal. No respiratory distress. She has no wheezes. She has no rales.  Musculoskeletal: She exhibits edema (3+ pitting edema to the knees, with non-pitting edema to mid thigh bilaterally). She exhibits no tenderness.  Skin: Skin is warm and dry.  BP 143/88  Pulse 84  Temp(Src) 98.5 F (36.9 C) (Oral)  Wt 255 lb (115.667 kg)  BMI 39.93 kg/m2    Assessment & Plan:

## 2012-09-16 ENCOUNTER — Other Ambulatory Visit (HOSPITAL_COMMUNITY): Payer: Self-pay | Admitting: Family Medicine

## 2012-09-16 ENCOUNTER — Other Ambulatory Visit: Payer: Self-pay | Admitting: Family Medicine

## 2012-09-16 ENCOUNTER — Telehealth: Payer: Self-pay | Admitting: Family Medicine

## 2012-09-16 MED ORDER — SODIUM BICARBONATE 650 MG PO TABS: 650.0000 mg | ORAL_TABLET | Freq: Two times a day (BID) | ORAL | Status: DC

## 2012-09-16 NOTE — Telephone Encounter (Signed)
Will forward to pcp

## 2012-09-16 NOTE — Telephone Encounter (Signed)
Will forward to pcp to see if he has received papers to fill out.

## 2012-09-16 NOTE — Telephone Encounter (Signed)
Patient dropped off paperwork with Dr. Birdie Sons that needs to be faxed by Friday for her licensing, she is checking to make sure it has been faxed.  Please call her and let her know.

## 2012-09-16 NOTE — Telephone Encounter (Signed)
Have received papers and will fill out and place in to fax box by tomorrow.

## 2012-09-16 NOTE — Telephone Encounter (Signed)
Pt notified.  Deontre Allsup L, CMA  

## 2012-09-16 NOTE — Telephone Encounter (Signed)
Please advise patient that I have refilled this. Also advise her that she needs to discuss the continuance of this medication with the renal doctor at her visit tomorrow.

## 2012-09-16 NOTE — Telephone Encounter (Signed)
Patient was last seen on 6/9, needs refills on several meds but does not have any refills left on any of her meds.  She needs all of her meds.  She doesn't have the names of any of them.  She said the phamacy is going to send the request on all of the meds.

## 2012-09-16 NOTE — Telephone Encounter (Signed)
Pt notified.  Vienne Corcoran L, CMA  

## 2012-09-17 NOTE — Telephone Encounter (Signed)
LMOVM that papers are ready up front for pick up.  Also stated Dr.Sonnenbergs message below.  Kathreen Dileo, Darlyne Russian, CMA

## 2012-09-17 NOTE — Telephone Encounter (Signed)
Paper work with my information will be completed shortly. It appears that the patient needs a vision exam by an eye doctor and there is a section to be filled out by the patient prior to this being sent in to the [facility]. Also noted on the first page it states that the patient has 30 days to get this completed from the sending of the document to the patient and the document is dated 03/17/12. Please confirm this with the patient. We can refer her to an eye specialist if needed, but she may need to contact the DMV to extend the time frame of getting the paper work filled out. Thanks.

## 2012-09-20 ENCOUNTER — Telehealth: Payer: Self-pay | Admitting: Family Medicine

## 2012-09-20 NOTE — Telephone Encounter (Signed)
Pt asks for paperwork about her license to be faxed to:  [phone]

## 2012-09-20 NOTE — Telephone Encounter (Signed)
Forms faxed and placed in Has Been Faxed box up front.  Reeanna Acri, Darlyne Russian, CMA

## 2012-09-22 ENCOUNTER — Inpatient Hospital Stay (HOSPITAL_COMMUNITY)
Admission: AD | Admit: 2012-09-22 | Discharge: 2012-09-22 | Disposition: A | Discharge status: Home / Self Care | Payer: Medicare HMO | Source: Ambulatory Visit | Attending: Obstetrics and Gynecology | Admitting: Obstetrics and Gynecology

## 2012-09-22 ENCOUNTER — Encounter (HOSPITAL_COMMUNITY): Payer: Self-pay | Admitting: *Deleted

## 2012-09-22 DIAGNOSIS — A6009 Herpesviral infection of other urogenital tract: Secondary | ICD-10-CM

## 2012-09-22 DIAGNOSIS — B372 Candidiasis of skin and nail: Secondary | ICD-10-CM | POA: Insufficient documentation

## 2012-09-22 DIAGNOSIS — A6000 Herpesviral infection of urogenital system, unspecified: Secondary | ICD-10-CM | POA: Insufficient documentation

## 2012-09-22 DIAGNOSIS — B3789 Other sites of candidiasis: Secondary | ICD-10-CM

## 2012-09-22 MED ORDER — FLUCONAZOLE 150 MG PO TABS
150.0000 mg | ORAL_TABLET | Freq: Once | ORAL | Status: AC
  Administered 2012-09-22: 150 mg via ORAL
  Filled 2012-09-22: qty 1

## 2012-09-22 MED ORDER — FLUCONAZOLE 150 MG PO TABS: 150.0000 mg | ORAL_TABLET | Freq: Once | ORAL | Status: DC

## 2012-09-22 MED ORDER — NYSTATIN-TRIAMCINOLONE 100000-0.1 UNIT/GM-% EX OINT: TOPICAL_OINTMENT | Freq: Two times a day (BID) | CUTANEOUS | Status: DC

## 2012-09-22 MED ORDER — VALACYCLOVIR HCL 1 G PO TABS: 1000.0000 mg | ORAL_TABLET | Freq: Two times a day (BID) | ORAL | Status: DC

## 2012-09-22 NOTE — MAU Note (Signed)
Pt states was seen 09/13/2012 for rash on bottom, was given A&D ointment and some powder. Has not put anything on bottom for 1 week. Recently found out she has lupus. Has had loose stools and has burning at site, and feels like area is getting worse. Area has bad smell and white discharge. Came here for further eval.
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
## 2012-09-24 ENCOUNTER — Encounter (HOSPITAL_COMMUNITY): Payer: Self-pay | Admitting: *Deleted

## 2012-09-24 ENCOUNTER — Other Ambulatory Visit (HOSPITAL_COMMUNITY): Payer: Self-pay | Admitting: *Deleted

## 2012-09-24 ENCOUNTER — Inpatient Hospital Stay (HOSPITAL_COMMUNITY)
Admission: AD | Admit: 2012-09-24 | Discharge: 2012-09-24 | Disposition: A | Discharge status: Home / Self Care | Payer: Medicare HMO | Source: Ambulatory Visit | Attending: Obstetrics and Gynecology | Admitting: Obstetrics and Gynecology

## 2012-09-24 DIAGNOSIS — L988 Other specified disorders of the skin and subcutaneous tissue: Secondary | ICD-10-CM

## 2012-09-24 DIAGNOSIS — B369 Superficial mycosis, unspecified: Secondary | ICD-10-CM

## 2012-09-24 DIAGNOSIS — R21 Rash and other nonspecific skin eruption: Secondary | ICD-10-CM | POA: Insufficient documentation

## 2012-09-24 DIAGNOSIS — L089 Local infection of the skin and subcutaneous tissue, unspecified: Secondary | ICD-10-CM | POA: Insufficient documentation
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
## 2012-09-24 NOTE — MAU Note (Addendum)
PT SAYS     SHE WAS HERE ON WED-   TOOK VALTREX-    THEN ON FRI  AM -   SHE SAW   RINGWORM ON PERINEUM  .ARRIVED VIA EMS

## 2012-09-27 ENCOUNTER — Encounter (HOSPITAL_COMMUNITY): Payer: Medicare HMO

## 2012-09-27 NOTE — MAU Provider Note (Signed)
Attestation of Attending Supervision of Advanced Practitioner (CNM/NP): Evaluation and management procedures were performed by the Advanced Practitioner under my supervision and collaboration.  I have reviewed the Advanced Practitioner's note and chart, and I agree with the management and plan.  Jozeph Persing 09/27/2012 12:01 PM

## 2012-09-28 ENCOUNTER — Encounter (HOSPITAL_COMMUNITY): Payer: Self-pay

## 2012-09-28 ENCOUNTER — Emergency Department (HOSPITAL_COMMUNITY)
Admission: EM | Admit: 2012-09-28 | Discharge: 2012-09-30 | Disposition: A | Discharge status: Home / Self Care | Payer: Medicare HMO | Attending: Emergency Medicine | Admitting: Emergency Medicine

## 2012-09-28 DIAGNOSIS — N189 Chronic kidney disease, unspecified: Secondary | ICD-10-CM

## 2012-09-28 DIAGNOSIS — E876 Hypokalemia: Secondary | ICD-10-CM

## 2012-09-28 DIAGNOSIS — Z3202 Encounter for pregnancy test, result negative: Secondary | ICD-10-CM | POA: Insufficient documentation

## 2012-09-28 DIAGNOSIS — F17200 Nicotine dependence, unspecified, uncomplicated: Secondary | ICD-10-CM | POA: Insufficient documentation

## 2012-09-28 DIAGNOSIS — F23 Brief psychotic disorder: Secondary | ICD-10-CM | POA: Insufficient documentation

## 2012-09-28 DIAGNOSIS — I129 Hypertensive chronic kidney disease with stage 1 through stage 4 chronic kidney disease, or unspecified chronic kidney disease: Secondary | ICD-10-CM | POA: Insufficient documentation

## 2012-09-28 DIAGNOSIS — M329 Systemic lupus erythematosus, unspecified: Secondary | ICD-10-CM

## 2012-09-28 DIAGNOSIS — F29 Unspecified psychosis not due to a substance or known physiological condition: Secondary | ICD-10-CM

## 2012-09-28 DIAGNOSIS — D649 Anemia, unspecified: Secondary | ICD-10-CM

## 2012-09-28 DIAGNOSIS — R609 Edema, unspecified: Secondary | ICD-10-CM | POA: Diagnosis present

## 2012-09-28 DIAGNOSIS — IMO0002 Reserved for concepts with insufficient information to code with codable children: Secondary | ICD-10-CM

## 2012-09-28 DIAGNOSIS — M3214 Glomerular disease in systemic lupus erythematosus: Secondary | ICD-10-CM | POA: Diagnosis present

## 2012-09-28 DIAGNOSIS — F3112 Bipolar disorder, current episode manic without psychotic features, moderate: Secondary | ICD-10-CM

## 2012-09-28 DIAGNOSIS — Z79899 Other long term (current) drug therapy: Secondary | ICD-10-CM | POA: Insufficient documentation

## 2012-09-28 DIAGNOSIS — F319 Bipolar disorder, unspecified: Secondary | ICD-10-CM | POA: Insufficient documentation

## 2012-09-28 LAB — COMPREHENSIVE METABOLIC PANEL
BUN: 56 mg/dL — ABNORMAL HIGH (ref 6–23)
Calcium: 9.4 mg/dL (ref 8.4–10.5)
Creatinine, Ser: 4.65 mg/dL — ABNORMAL HIGH (ref 0.50–1.10)
GFR calc Af Amer: 13 mL/min — ABNORMAL LOW (ref >90)
Glucose, Bld: 100 mg/dL — ABNORMAL HIGH (ref 70–99)
Total Protein: 6.4 g/dL (ref 6.0–8.3)

## 2012-09-28 LAB — POCT PREGNANCY, URINE: Preg Test, Ur: NEGATIVE

## 2012-09-28 LAB — CBC
HCT: 24.4 % — ABNORMAL LOW (ref 36.0–46.0)
Hemoglobin: 8 g/dL — ABNORMAL LOW (ref 12.0–15.0)
MCH: 26.8 pg (ref 26.0–34.0)
MCHC: 32.8 g/dL (ref 30.0–36.0)

## 2012-09-28 LAB — RAPID URINE DRUG SCREEN, HOSP PERFORMED
Cocaine: NOT DETECTED
Opiates: NOT DETECTED

## 2012-09-28 LAB — ETHANOL: Alcohol, Ethyl (B): <11 mg/dL (ref 0–11)

## 2012-09-28 MED ORDER — ACETAMINOPHEN 325 MG PO TABS
650.0000 mg | ORAL_TABLET | ORAL | Status: DC | PRN
  Administered 2012-09-29: 325 mg via ORAL
  Filled 2012-09-28: qty 2

## 2012-09-28 MED ORDER — HALOPERIDOL 5 MG PO TABS: 5.0000 mg | ORAL_TABLET | Freq: Four times a day (QID) | ORAL | Status: DC | PRN

## 2012-09-28 MED ORDER — AMLODIPINE BESYLATE 10 MG PO TABS
10.0000 mg | ORAL_TABLET | Freq: Every day | ORAL | Status: DC
  Administered 2012-09-28 – 2012-09-30 (×3): 10 mg via ORAL
  Filled 2012-09-28 (×3): qty 1

## 2012-09-28 MED ORDER — ALUM & MAG HYDROXIDE-SIMETH 200-200-20 MG/5ML PO SUSP: 30.0000 mL | ORAL | Status: DC | PRN

## 2012-09-28 MED ORDER — FLUCONAZOLE 150 MG PO TABS
150.0000 mg | ORAL_TABLET | Freq: Once | ORAL | Status: AC
  Administered 2012-09-28: 150 mg via ORAL
  Filled 2012-09-28: qty 1

## 2012-09-28 MED ORDER — PANTOPRAZOLE SODIUM 40 MG PO TBEC
40.0000 mg | DELAYED_RELEASE_TABLET | Freq: Every day | ORAL | Status: DC
  Administered 2012-09-28 – 2012-09-30 (×3): 40 mg via ORAL
  Filled 2012-09-28 (×3): qty 1

## 2012-09-28 MED ORDER — NICOTINE 21 MG/24HR TD PT24
21.0000 mg | MEDICATED_PATCH | Freq: Every day | TRANSDERMAL | Status: DC
  Filled 2012-09-28: qty 1

## 2012-09-28 MED ORDER — FUROSEMIDE 80 MG PO TABS
80.0000 mg | ORAL_TABLET | Freq: Two times a day (BID) | ORAL | Status: DC
  Administered 2012-09-28 – 2012-09-30 (×3): 80 mg via ORAL
  Filled 2012-09-28 (×6): qty 1

## 2012-09-28 MED ORDER — POTASSIUM CHLORIDE CRYS ER 20 MEQ PO TBCR: 40.0000 meq | EXTENDED_RELEASE_TABLET | Freq: Once | ORAL | Status: DC

## 2012-09-28 MED ORDER — MYCOPHENOLATE MOFETIL 500 MG PO TABS
1500.0000 mg | ORAL_TABLET | Freq: Two times a day (BID) | ORAL | Status: DC
  Administered 2012-09-28: 1500 mg via ORAL
  Filled 2012-09-28 (×3): qty 3

## 2012-09-28 MED ORDER — ONDANSETRON HCL 4 MG PO TABS: 4.0000 mg | ORAL_TABLET | Freq: Three times a day (TID) | ORAL | Status: DC | PRN

## 2012-09-28 MED ORDER — CALCITRIOL 0.25 MCG PO CAPS
0.2500 ug | ORAL_CAPSULE | Freq: Every day | ORAL | Status: DC
  Administered 2012-09-28 – 2012-09-30 (×3): 0.25 ug via ORAL
  Filled 2012-09-28 (×3): qty 1

## 2012-09-28 MED ORDER — LORAZEPAM 1 MG PO TABS: 1.0000 mg | ORAL_TABLET | Freq: Once | ORAL | Status: AC | PRN

## 2012-09-28 MED ORDER — ZOLPIDEM TARTRATE 5 MG PO TABS: 5.0000 mg | ORAL_TABLET | Freq: Every evening | ORAL | Status: DC | PRN

## 2012-09-28 MED ORDER — PREDNISONE 50 MG PO TABS
60.0000 mg | ORAL_TABLET | Freq: Every day | ORAL | Status: DC
  Administered 2012-09-28 – 2012-09-30 (×3): 60 mg via ORAL
  Filled 2012-09-28: qty 1
  Filled 2012-09-28: qty 3
  Filled 2012-09-28: qty 1

## 2012-09-28 MED ORDER — POTASSIUM CHLORIDE CRYS ER 20 MEQ PO TBCR
40.0000 meq | EXTENDED_RELEASE_TABLET | Freq: Once | ORAL | Status: AC
  Administered 2012-09-28: 40 meq via ORAL
  Filled 2012-09-28: qty 1

## 2012-09-28 MED ORDER — IBUPROFEN 600 MG PO TABS: 600.0000 mg | ORAL_TABLET | Freq: Three times a day (TID) | ORAL | Status: DC | PRN

## 2012-09-28 MED ORDER — SODIUM BICARBONATE 650 MG PO TABS
650.0000 mg | ORAL_TABLET | Freq: Two times a day (BID) | ORAL | Status: DC
  Administered 2012-09-28 – 2012-09-30 (×4): 650 mg via ORAL
  Filled 2012-09-28 (×5): qty 1

## 2012-09-28 MED ORDER — POTASSIUM CHLORIDE CRYS ER 20 MEQ PO TBCR
40.0000 meq | EXTENDED_RELEASE_TABLET | Freq: Once | ORAL | Status: AC
  Administered 2012-09-28: 40 meq via ORAL
  Filled 2012-09-28: qty 4

## 2012-09-28 NOTE — ED Notes (Signed)
ekg performed. Pt calm and cooperative

## 2012-09-28 NOTE — ED Notes (Signed)
Pt refusing EKG

## 2012-09-28 NOTE — ED Notes (Signed)
SEE IVC PAPERS-[employer] PRESENT. Pt brought to room 8 Charge Jenn RN aware of pt current status. Pt behavior decreased

## 2012-09-28 NOTE — ED Notes (Signed)
Patient's belongings placed in locker 26.RN Morrie Sheldon made aware
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
## 2012-09-28 NOTE — ED Notes (Signed)
EKG given to Dr. Wentz 
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
## 2012-09-29 ENCOUNTER — Encounter (HOSPITAL_COMMUNITY): Payer: Self-pay | Admitting: Registered Nurse

## 2012-09-29 DIAGNOSIS — F29 Unspecified psychosis not due to a substance or known physiological condition: Secondary | ICD-10-CM

## 2012-09-29 DIAGNOSIS — F3110 Bipolar disorder, current episode manic without psychotic features, unspecified: Secondary | ICD-10-CM

## 2012-09-29 LAB — POCT I-STAT, CHEM 8
BUN: 54 mg/dL — ABNORMAL HIGH (ref 6–23)
Chloride: 110 mEq/L (ref 96–112)
Creatinine, Ser: 4.6 mg/dL — ABNORMAL HIGH (ref 0.50–1.10)
Potassium: 3.9 mEq/L (ref 3.5–5.1)
Sodium: 140 mEq/L (ref 135–145)

## 2012-09-29 MED ORDER — ZIPRASIDONE MESYLATE 20 MG IM SOLR
INTRAMUSCULAR | Status: AC
  Administered 2012-09-29: 20 mg via INTRAMUSCULAR
  Filled 2012-09-29: qty 20

## 2012-09-29 MED ORDER — LORAZEPAM 2 MG/ML IJ SOLN: 2.0000 mg | Freq: Once | INTRAMUSCULAR | Status: AC

## 2012-09-29 MED ORDER — HALOPERIDOL LACTATE 5 MG/ML IJ SOLN: 5.0000 mg | Freq: Two times a day (BID) | INTRAMUSCULAR | Status: DC

## 2012-09-29 MED ORDER — MYCOPHENOLATE MOFETIL 250 MG PO CAPS
1500.0000 mg | ORAL_CAPSULE | Freq: Two times a day (BID) | ORAL | Status: DC
  Administered 2012-09-29 – 2012-09-30 (×3): 1500 mg via ORAL
  Filled 2012-09-29 (×4): qty 6

## 2012-09-29 MED ORDER — DIPHENHYDRAMINE HCL 50 MG/ML IJ SOLN
INTRAMUSCULAR | Status: AC
  Administered 2012-09-29: 50 mg via INTRAMUSCULAR
  Filled 2012-09-29: qty 1

## 2012-09-29 MED ORDER — POTASSIUM CHLORIDE CRYS ER 20 MEQ PO TBCR: 40.0000 meq | EXTENDED_RELEASE_TABLET | Freq: Once | ORAL | Status: DC

## 2012-09-29 MED ORDER — LORAZEPAM 2 MG/ML IJ SOLN
INTRAMUSCULAR | Status: AC
  Administered 2012-09-29: 2 mg via INTRAMUSCULAR
  Filled 2012-09-29: qty 1

## 2012-09-29 MED ORDER — HALOPERIDOL 5 MG PO TABS
5.0000 mg | ORAL_TABLET | Freq: Two times a day (BID) | ORAL | Status: DC
  Administered 2012-09-29 – 2012-09-30 (×3): 5 mg via ORAL
  Filled 2012-09-29 (×3): qty 1

## 2012-09-29 MED ORDER — ZIPRASIDONE MESYLATE 20 MG IM SOLR: 20.0000 mg | Freq: Once | INTRAMUSCULAR | Status: AC

## 2012-09-29 MED ORDER — DIPHENHYDRAMINE HCL 50 MG/ML IJ SOLN: 50.0000 mg | Freq: Once | INTRAMUSCULAR | Status: AC

## 2012-09-29 NOTE — ED Notes (Signed)
At nurses station wanting to go home stating she needs to get to the magistrates office to get her medicine. Rapid speech.

## 2012-09-29 NOTE — Progress Notes (Signed)
Reviewed patient's lab results.  Dr. Estell Harpin aware of elevated BUN and Creatinine. No further interventions at this time.  EDCM also spoke to Psych ED RN Marcelino Duster who is aware of lab results.

## 2012-09-29 NOTE — ED Provider Notes (Signed)
Medical screening examination/treatment/procedure(s) were performed by non-physician practitioner and as supervising physician I was immediately available for consultation/collaboration.  [name], MD 09/29/12 0105 

## 2012-09-29 NOTE — ED Notes (Signed)
Given one time meds due to her escalating throughout the day. Difficult to redirect, charging at [facility] NP when she opened office door. Threatening tone, loud to staff. Hyperverbal, pressured speech, labile, hostile, cursing. Complete lose of control.

## 2012-09-29 NOTE — ED Notes (Signed)
At nurses station hyperverbal, pressured speech, delusional.
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
## 2012-09-30 ENCOUNTER — Ambulatory Visit: Payer: Medicare HMO | Admitting: Family Medicine

## 2012-09-30 NOTE — MAU Provider Note (Signed)
Attestation of Attending Supervision of Advanced Practitioner (CNM/NP): Evaluation and management procedures were performed by the Advanced Practitioner under my supervision and collaboration.  I have reviewed the Advanced Practitioner's note and chart, and I agree with the management and plan.  Hikari Tripp 09/30/2012 10:09 AM   

## 2012-09-30 NOTE — ED Notes (Signed)
Has been calm and cooperative during the night. Patient has had a pleasant attitude. Patient denies SI, HI, and AVH.

## 2012-09-30 NOTE — ED Notes (Signed)
Patient reports that she needs to go home because she has an appointment with her kidney doctor.

## 2012-09-30 NOTE — ED Provider Notes (Signed)
Psychiatry assessment team states patient is stable to be discharged home with community health follow up  Sara Hutching, MD 09/30/12 1034

## 2012-11-08 ENCOUNTER — Encounter (HOSPITAL_COMMUNITY): Payer: Medicare HMO

## 2012-11-09 ENCOUNTER — Encounter (HOSPITAL_COMMUNITY)
Admission: RE | Admit: 2012-11-09 | Discharge: 2012-11-09 | Disposition: A | Discharge status: Home / Self Care | Payer: Medicare HMO | Source: Ambulatory Visit | Attending: Nephrology | Admitting: Nephrology

## 2012-11-09 ENCOUNTER — Telehealth: Payer: Self-pay | Admitting: Family Medicine

## 2012-11-09 DIAGNOSIS — D638 Anemia in other chronic diseases classified elsewhere: Secondary | ICD-10-CM | POA: Insufficient documentation

## 2012-11-09 LAB — FERRITIN: Ferritin: 568 ng/mL — ABNORMAL HIGH (ref 10–291)

## 2012-11-09 LAB — RENAL FUNCTION PANEL
BUN: 18 mg/dL (ref 6–23)
CO2: 23 mEq/L (ref 19–32)
Chloride: 110 mEq/L (ref 96–112)
GFR calc Af Amer: 15 mL/min — ABNORMAL LOW (ref >90)
Glucose, Bld: 73 mg/dL (ref 70–99)
Potassium: 3.5 mEq/L (ref 3.5–5.1)
Sodium: 140 mEq/L (ref 135–145)

## 2012-11-09 LAB — IRON AND TIBC
Iron: 21 ug/dL — ABNORMAL LOW (ref 42–135)
TIBC: 207 ug/dL — ABNORMAL LOW (ref 250–470)
UIBC: 186 ug/dL (ref 125–400)

## 2012-11-09 LAB — POCT HEMOGLOBIN-HEMACUE: Hemoglobin: 6.4 g/dL — CL (ref 12.0–15.0)

## 2012-11-09 MED ORDER — DARBEPOETIN ALFA-POLYSORBATE 100 MCG/0.5ML IJ SOLN: 100.0000 ug | INTRAMUSCULAR | Status: DC

## 2012-11-09 MED ORDER — DARBEPOETIN ALFA-POLYSORBATE 100 MCG/0.5ML IJ SOLN
INTRAMUSCULAR | Status: AC
  Administered 2012-11-09: 100 ug via SUBCUTANEOUS
  Filled 2012-11-09: qty 0.5

## 2012-11-09 NOTE — Telephone Encounter (Signed)
Entered in delay.  Received call from Dr. Briant Cedar of [facility] regarding Ms. Sara [name]. Patient had appointment today. Per Dr. Briant Cedar appears to be doing well, though is not taking her medications. Her renal function is improving. Her hgb was 6.4. Per Dr. Briant Cedar the patient was not complaining of any symptoms. He suspects her anemia is related to her lupus and would improve with taking her medications. Will discuss need for transfusion with attending within our practice in the morning. I do not believe she needs an emergent transfusion given lack of symptoms, but will need to keep a close eye on this and will have our scheduling staff call the patient for a follow-up appointment.

## 2012-11-09 NOTE — Progress Notes (Signed)
Hemocue today was 6.4.  Called and reported result to Joanette Gula, CMA at [facility].  Ok to give pt injection and the office will follow up with patient if there are any changes.

## 2012-11-10 ENCOUNTER — Telehealth: Payer: Self-pay | Admitting: Family Medicine

## 2012-11-10 ENCOUNTER — Encounter (HOSPITAL_COMMUNITY): Payer: Self-pay | Admitting: Adult Health

## 2012-11-10 ENCOUNTER — Emergency Department (HOSPITAL_COMMUNITY)
Admission: EM | Admit: 2012-11-10 | Discharge: 2012-11-10 | Disposition: A | Discharge status: Home / Self Care | Payer: Medicare HMO | Attending: Emergency Medicine | Admitting: Emergency Medicine

## 2012-11-10 DIAGNOSIS — R748 Abnormal levels of other serum enzymes: Secondary | ICD-10-CM | POA: Insufficient documentation

## 2012-11-10 DIAGNOSIS — Z79899 Other long term (current) drug therapy: Secondary | ICD-10-CM | POA: Insufficient documentation

## 2012-11-10 DIAGNOSIS — D649 Anemia, unspecified: Secondary | ICD-10-CM | POA: Insufficient documentation

## 2012-11-10 DIAGNOSIS — R7989 Other specified abnormal findings of blood chemistry: Secondary | ICD-10-CM

## 2012-11-10 DIAGNOSIS — N058 Unspecified nephritic syndrome with other morphologic changes: Secondary | ICD-10-CM | POA: Insufficient documentation

## 2012-11-10 DIAGNOSIS — M329 Systemic lupus erythematosus, unspecified: Secondary | ICD-10-CM | POA: Insufficient documentation

## 2012-11-10 DIAGNOSIS — M3214 Glomerular disease in systemic lupus erythematosus: Secondary | ICD-10-CM

## 2012-11-10 DIAGNOSIS — Z8659 Personal history of other mental and behavioral disorders: Secondary | ICD-10-CM | POA: Insufficient documentation

## 2012-11-10 DIAGNOSIS — D638 Anemia in other chronic diseases classified elsewhere: Secondary | ICD-10-CM

## 2012-11-10 DIAGNOSIS — F17200 Nicotine dependence, unspecified, uncomplicated: Secondary | ICD-10-CM | POA: Insufficient documentation

## 2012-11-10 DIAGNOSIS — Z8739 Personal history of other diseases of the musculoskeletal system and connective tissue: Secondary | ICD-10-CM | POA: Insufficient documentation

## 2012-11-10 DIAGNOSIS — R51 Headache: Secondary | ICD-10-CM | POA: Insufficient documentation

## 2012-11-10 DIAGNOSIS — I10 Essential (primary) hypertension: Secondary | ICD-10-CM | POA: Insufficient documentation

## 2012-11-10 LAB — BASIC METABOLIC PANEL
BUN: 18 mg/dL (ref 6–23)
Chloride: 105 mEq/L (ref 96–112)
Creatinine, Ser: 4.28 mg/dL — ABNORMAL HIGH (ref 0.50–1.10)
GFR calc Af Amer: 15 mL/min — ABNORMAL LOW (ref >90)
GFR calc non Af Amer: 13 mL/min — ABNORMAL LOW (ref >90)
Potassium: 3.9 mEq/L (ref 3.5–5.1)

## 2012-11-10 LAB — TYPE AND SCREEN: ABO/RH(D): O POS

## 2012-11-10 LAB — CBC WITH DIFFERENTIAL/PLATELET
Basophils Absolute: 0 10*3/uL (ref 0.0–0.1)
Basophils Relative: 0 % (ref 0–1)
Eosinophils Absolute: 0 10*3/uL (ref 0.0–0.7)
MCH: 27.7 pg (ref 26.0–34.0)
MCHC: 33.3 g/dL (ref 30.0–36.0)
Neutro Abs: 6 10*3/uL (ref 1.7–7.7)
Neutrophils Relative %: 87 % — ABNORMAL HIGH (ref 43–77)
RDW: 15.9 % — ABNORMAL HIGH (ref 11.5–15.5)

## 2012-11-10 NOTE — Discharge Instructions (Signed)
Anemia, Nonspecific  Your exam and blood tests show you are anemic. This means your blood (hemoglobin) level is low. Normal hemoglobin values are 12 to 15 g/dL for females and 14 to 17 g/dL for males. Make a note of your hemoglobin level today. The hematocrit percent is also used to measure anemia. A normal hematocrit is 38% to 46% in females and 42% to 49% in males. Make a note of your hematocrit level today.  CAUSES   Anemia can be due to many different causes.   Excessive bleeding from periods (in women).   Intestinal bleeding.   Poor nutrition.   Kidney, thyroid, liver, and bone marrow diseases.  SYMPTOMS   Anemia can come on suddenly (acute). It can also come on slowly. Symptoms can include:   Minor weakness.   Dizziness.   Palpitations.   Shortness of breath.  Symptoms may be absent until half your hemoglobin is missing if it comes on slowly. Anemia due to acute blood loss from an injury or internal bleeding may require blood transfusion if the loss is severe. Hospital care is needed if you are anemic and there is significant continual blood loss.  TREATMENT    Stool tests for blood (Hemoccult) and additional lab tests are often needed. This determines the best treatment.   Further checking on your condition and your response to treatment is very important. It often takes many weeks to correct anemia.  Depending on the cause, treatment can include:   Supplements of iron.   Vitamins B12 and folic acid.   Hormone medicines.  If your anemia is due to bleeding, finding the cause of the blood loss is very important. This will help avoid further problems.  SEEK IMMEDIATE MEDICAL CARE IF:    You develop fainting, extreme weakness, shortness of breath, or chest pain.   You develop heavy vaginal bleeding.   You develop bloody or black, tarry stools or vomit up blood.   You develop a high fever, rash, repeated vomiting, or dehydration.  Document Released: 05/01/2004 Document Revised: 06/16/2011 Document  Reviewed: 02/06/2009  [employer] Patient Information 2014 [employer].

## 2012-11-10 NOTE — ED Notes (Addendum)
Presents with low hemoglobin, sent from [facility] day clinic, Hgb 6.4. Began aranesp yesterday. Denies weakness. Pale mucous membranes. Denies pain, denies blood in stool, denies heavy periods.

## 2012-11-10 NOTE — Telephone Encounter (Signed)
See phone note documented from yesterday. I attempted to call the patient to discuss her hemoglobin from yesterday. I discussed this with Dr. Lum Babe and we came to the decision to ask the patient to come in to the ED to have this checked and to be evaluated for potential transfusion. There was no answer when I called the patient. I asked that she call the clinic back as soon as possible and we will need to advise her to come to the ED for evaluation for potential transfusion for hgb of 6.4.

## 2012-11-10 NOTE — ED Notes (Signed)
PT comfortable with d/c and f/u instructions. No Prescriptions  
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
## 2012-11-10 NOTE — Telephone Encounter (Signed)
Patient called back and discussed results with her. Stated she felt fine. Since she felt fine I advised her to go to the ED for repeat lab work evaluation for consideration of transfusion. She was agreeable to this plan.

## 2012-11-11 NOTE — ED Provider Notes (Signed)
Medical screening examination/treatment/procedure(s) were performed by non-physician practitioner and as supervising physician I was immediately available for consultation/collaboration.  Keyshun Elpers M Jun Rightmyer, MD 11/11/12 2051 

## 2012-11-18 NOTE — ED Provider Notes (Signed)
Medical screening examination/treatment/procedure(s) were performed by non-physician practitioner and as supervising physician I was immediately available for consultation/collaboration.  Toy Baker, MD 11/18/12 [phone]

## 2012-11-23 ENCOUNTER — Encounter (HOSPITAL_COMMUNITY): Payer: Medicare HMO

## 2012-11-26 ENCOUNTER — Encounter (HOSPITAL_COMMUNITY): Payer: Medicare HMO

## 2012-12-06 ENCOUNTER — Encounter (HOSPITAL_COMMUNITY): Payer: Self-pay

## 2012-12-06 ENCOUNTER — Emergency Department (INDEPENDENT_AMBULATORY_CARE_PROVIDER_SITE_OTHER)
Admission: EM | Admit: 2012-12-06 | Discharge: 2012-12-06 | Disposition: A | Discharge status: Home / Self Care | Payer: Medicare HMO | Source: Home / Self Care | Attending: Emergency Medicine | Admitting: Emergency Medicine

## 2012-12-06 DIAGNOSIS — L089 Local infection of the skin and subcutaneous tissue, unspecified: Secondary | ICD-10-CM

## 2012-12-06 DIAGNOSIS — IMO0002 Reserved for concepts with insufficient information to code with codable children: Secondary | ICD-10-CM

## 2012-12-06 LAB — POCT PREGNANCY, URINE: Preg Test, Ur: NEGATIVE

## 2012-12-06 MED ORDER — AMOXICILLIN-POT CLAVULANATE 875-125 MG PO TABS: 1.0000 | ORAL_TABLET | Freq: Two times a day (BID) | ORAL | Status: DC

## 2012-12-06 NOTE — ED Notes (Signed)
Concerned about lump on finger that has been pussing up, lump on side of left foot, and lump on her vajayjay

## 2012-12-06 NOTE — ED Provider Notes (Signed)
Medical screening examination/treatment/procedure(s) were performed by non-physician practitioner and as supervising physician I was immediately available for consultation/collaboration.  Leslee Home, M.D.  Reuben Likes, MD 12/06/12 [phone]
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
## 2012-12-06 NOTE — ED Notes (Signed)
C/o pain "9" , but calm, conversant, skin w/d/color good, moves in free and fluid manner

## 2012-12-10 ENCOUNTER — Encounter (HOSPITAL_COMMUNITY): Payer: Self-pay | Admitting: Emergency Medicine

## 2012-12-10 ENCOUNTER — Emergency Department (HOSPITAL_COMMUNITY): Payer: Medicare HMO

## 2012-12-10 ENCOUNTER — Observation Stay (HOSPITAL_COMMUNITY)
Admission: EM | Admit: 2012-12-10 | Discharge: 2012-12-12 | Disposition: A | Discharge status: Home / Self Care | Payer: Medicare HMO | Attending: General Surgery | Admitting: General Surgery

## 2012-12-10 DIAGNOSIS — F29 Unspecified psychosis not due to a substance or known physiological condition: Secondary | ICD-10-CM | POA: Diagnosis present

## 2012-12-10 DIAGNOSIS — S0180XA Unspecified open wound of other part of head, initial encounter: Secondary | ICD-10-CM | POA: Insufficient documentation

## 2012-12-10 DIAGNOSIS — S060XAA Concussion with loss of consciousness status unknown, initial encounter: Secondary | ICD-10-CM

## 2012-12-10 DIAGNOSIS — S060X9A Concussion with loss of consciousness of unspecified duration, initial encounter: Secondary | ICD-10-CM

## 2012-12-10 DIAGNOSIS — M3214 Glomerular disease in systemic lupus erythematosus: Secondary | ICD-10-CM | POA: Diagnosis present

## 2012-12-10 DIAGNOSIS — F1210 Cannabis abuse, uncomplicated: Secondary | ICD-10-CM | POA: Insufficient documentation

## 2012-12-10 DIAGNOSIS — I10 Essential (primary) hypertension: Secondary | ICD-10-CM | POA: Insufficient documentation

## 2012-12-10 DIAGNOSIS — S0003XA Contusion of scalp, initial encounter: Secondary | ICD-10-CM | POA: Diagnosis not present

## 2012-12-10 DIAGNOSIS — S00209A Unspecified superficial injury of unspecified eyelid and periocular area, initial encounter: Secondary | ICD-10-CM | POA: Insufficient documentation

## 2012-12-10 DIAGNOSIS — S060X0A Concussion without loss of consciousness, initial encounter: Principal | ICD-10-CM | POA: Insufficient documentation

## 2012-12-10 DIAGNOSIS — F309 Manic episode, unspecified: Secondary | ICD-10-CM

## 2012-12-10 DIAGNOSIS — D649 Anemia, unspecified: Secondary | ICD-10-CM

## 2012-12-10 DIAGNOSIS — S0181XA Laceration without foreign body of other part of head, initial encounter: Secondary | ICD-10-CM

## 2012-12-10 LAB — BASIC METABOLIC PANEL
BUN: 28 mg/dL — ABNORMAL HIGH (ref 6–23)
Calcium: 9.9 mg/dL (ref 8.4–10.5)
GFR calc Af Amer: 11 mL/min — ABNORMAL LOW (ref >90)
GFR calc non Af Amer: 10 mL/min — ABNORMAL LOW (ref >90)
Glucose, Bld: 103 mg/dL — ABNORMAL HIGH (ref 70–99)
Potassium: 3 mEq/L — ABNORMAL LOW (ref 3.5–5.1)

## 2012-12-10 LAB — CBC
HCT: 20.2 % — ABNORMAL LOW (ref 36.0–46.0)
Hemoglobin: 6.8 g/dL — CL (ref 12.0–15.0)
MCH: 27.2 pg (ref 26.0–34.0)
MCHC: 33.7 g/dL (ref 30.0–36.0)
RDW: 14.9 % (ref 11.5–15.5)

## 2012-12-10 MED ORDER — SODIUM BICARBONATE 650 MG PO TABS
650.0000 mg | ORAL_TABLET | Freq: Two times a day (BID) | ORAL | Status: DC
  Administered 2012-12-11 – 2012-12-12 (×3): 650 mg via ORAL
  Filled 2012-12-10 (×4): qty 1

## 2012-12-10 MED ORDER — CALCITRIOL 0.25 MCG PO CAPS
0.2500 ug | ORAL_CAPSULE | Freq: Every day | ORAL | Status: DC
  Administered 2012-12-11 – 2012-12-12 (×2): 0.25 ug via ORAL
  Filled 2012-12-10 (×2): qty 1

## 2012-12-10 MED ORDER — OXYCODONE HCL 5 MG PO TABS
10.0000 mg | ORAL_TABLET | ORAL | Status: DC | PRN
  Administered 2012-12-11: 10 mg via ORAL
  Filled 2012-12-10: qty 2

## 2012-12-10 MED ORDER — STERILE WATER FOR INJECTION IJ SOLN
INTRAMUSCULAR | Status: AC
  Administered 2012-12-10: 1.2 mL
  Filled 2012-12-10: qty 10

## 2012-12-10 MED ORDER — ZIPRASIDONE MESYLATE 20 MG IM SOLR
20.0000 mg | Freq: Once | INTRAMUSCULAR | Status: AC
  Administered 2012-12-10: 20 mg via INTRAMUSCULAR
  Filled 2012-12-10: qty 20

## 2012-12-10 MED ORDER — ACETAMINOPHEN 325 MG PO TABS: 650.0000 mg | ORAL_TABLET | ORAL | Status: DC | PRN

## 2012-12-10 MED ORDER — LORAZEPAM 2 MG/ML IJ SOLN
2.0000 mg | Freq: Once | INTRAMUSCULAR | Status: AC
  Administered 2012-12-10: 2 mg via INTRAMUSCULAR

## 2012-12-10 MED ORDER — MYCOPHENOLATE MOFETIL 500 MG PO TABS: 1500.0000 mg | ORAL_TABLET | Freq: Two times a day (BID) | ORAL | Status: DC

## 2012-12-10 MED ORDER — LORAZEPAM 2 MG/ML IJ SOLN
INTRAMUSCULAR | Status: AC
  Filled 2012-12-10: qty 1

## 2012-12-10 MED ORDER — ZIPRASIDONE MESYLATE 20 MG IM SOLR
10.0000 mg | Freq: Once | INTRAMUSCULAR | Status: AC
  Administered 2012-12-10: 10 mg via INTRAMUSCULAR
  Filled 2012-12-10: qty 20

## 2012-12-10 MED ORDER — PANTOPRAZOLE SODIUM 40 MG PO TBEC
40.0000 mg | DELAYED_RELEASE_TABLET | Freq: Every day | ORAL | Status: DC
  Filled 2012-12-10 (×2): qty 1

## 2012-12-10 MED ORDER — POTASSIUM CHLORIDE IN NACL 20-0.9 MEQ/L-% IV SOLN
INTRAVENOUS | Status: DC
  Administered 2012-12-10: 20 mL/h via INTRAVENOUS
  Filled 2012-12-10 (×2): qty 1000

## 2012-12-10 MED ORDER — FUROSEMIDE 80 MG PO TABS
80.0000 mg | ORAL_TABLET | Freq: Two times a day (BID) | ORAL | Status: DC
  Administered 2012-12-11 – 2012-12-12 (×3): 80 mg via ORAL
  Filled 2012-12-10 (×5): qty 1

## 2012-12-10 MED ORDER — PANTOPRAZOLE SODIUM 40 MG IV SOLR
40.0000 mg | Freq: Every day | INTRAVENOUS | Status: DC
  Filled 2012-12-10 (×2): qty 40

## 2012-12-10 MED ORDER — PREDNISONE 50 MG PO TABS
60.0000 mg | ORAL_TABLET | Freq: Every day | ORAL | Status: DC
  Administered 2012-12-11 – 2012-12-12 (×2): 60 mg via ORAL
  Filled 2012-12-10 (×2): qty 1

## 2012-12-10 MED ORDER — ONDANSETRON HCL 4 MG/2ML IJ SOLN: 4.0000 mg | Freq: Four times a day (QID) | INTRAMUSCULAR | Status: DC | PRN

## 2012-12-10 MED ORDER — DIPHENHYDRAMINE HCL 50 MG/ML IJ SOLN
50.0000 mg | Freq: Once | INTRAMUSCULAR | Status: AC
  Administered 2012-12-10: 50 mg via INTRAMUSCULAR

## 2012-12-10 MED ORDER — MORPHINE SULFATE 2 MG/ML IJ SOLN
2.0000 mg | INTRAMUSCULAR | Status: DC | PRN
  Administered 2012-12-10: 2 mg via INTRAVENOUS
  Filled 2012-12-10: qty 1

## 2012-12-10 MED ORDER — ONDANSETRON HCL 4 MG PO TABS: 4.0000 mg | ORAL_TABLET | Freq: Four times a day (QID) | ORAL | Status: DC | PRN

## 2012-12-10 MED ORDER — AMLODIPINE BESYLATE 10 MG PO TABS
10.0000 mg | ORAL_TABLET | Freq: Every day | ORAL | Status: DC
  Administered 2012-12-11 – 2012-12-12 (×2): 10 mg via ORAL
  Filled 2012-12-10 (×2): qty 1

## 2012-12-10 MED ORDER — DIPHENHYDRAMINE HCL 50 MG/ML IJ SOLN
INTRAMUSCULAR | Status: AC
  Filled 2012-12-10: qty 1

## 2012-12-10 MED ORDER — FUROSEMIDE 80 MG PO TABS: 80.0000 mg | ORAL_TABLET | Freq: Two times a day (BID) | ORAL | Status: DC

## 2012-12-10 MED ORDER — MYCOPHENOLATE MOFETIL 250 MG PO CAPS
1500.0000 mg | ORAL_CAPSULE | Freq: Two times a day (BID) | ORAL | Status: DC
  Administered 2012-12-11 – 2012-12-12 (×3): 1500 mg via ORAL
  Filled 2012-12-10 (×5): qty 6

## 2012-12-10 MED ORDER — LORAZEPAM 2 MG/ML IJ SOLN
1.0000 mg | INTRAMUSCULAR | Status: DC | PRN
  Administered 2012-12-10: 1 mg via INTRAVENOUS
  Filled 2012-12-10 (×2): qty 1

## 2012-12-10 MED ORDER — STERILE WATER FOR INJECTION IJ SOLN
INTRAMUSCULAR | Status: AC
  Administered 2012-12-10: 10 mL
  Filled 2012-12-10: qty 10

## 2012-12-10 MED ORDER — OXYCODONE HCL 5 MG PO TABS: 5.0000 mg | ORAL_TABLET | ORAL | Status: DC | PRN
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
## 2012-12-10 NOTE — ED Notes (Addendum)
Patient yelling at staff ambulated out of room to ED ambulance door.  Security, [employer], Press photographer and Nurse talking with patient.  Patient continued to yell and pull out own IV.  EDP also speaking patient who continues to yell at staff.
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
## 2012-12-10 NOTE — ED Notes (Signed)
Patient has gotten out of bed and is now getting in staff personal space, cursing, refusing to return to her room, pt states she is leaving the hospital (ivc), unable to get pt to deescalate her behaviors. Concern for staff safety. Concern for pt safety. Dr Gwendolyn Grant consulted and decision made to restrain pt and medicate her.

## 2012-12-10 NOTE — ED Notes (Signed)
Iv team has not called back. Pt moved to 6 n
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
## 2012-12-10 NOTE — ED Notes (Addendum)
IV team paged. 2 RNs attempted IV sticks. Unable to obtain IV access at this time.

## 2012-12-10 NOTE — ED Notes (Signed)
Report called to laura on [address]. She has asked that we try to get iv team to restart iv, if unable to is ok will get started on floor.

## 2012-12-10 NOTE — ED Notes (Signed)
Iv team paged.
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
## 2012-12-10 NOTE — ED Notes (Signed)
Reviewed pt's last lab work, HGB and RBC also low previously.

## 2012-12-10 NOTE — ED Notes (Signed)
Spoke with Company secretary. They advise they are cleaning a room for pt on [address].

## 2012-12-10 NOTE — ED Notes (Signed)
Spoke with [employer].  Reported that pt. Will need a sitter.
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
## 2012-12-10 NOTE — Progress Notes (Signed)
UR completed 
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
## 2012-12-11 DIAGNOSIS — S060X0A Concussion without loss of consciousness, initial encounter: Secondary | ICD-10-CM | POA: Diagnosis not present

## 2012-12-11 LAB — CBC
HCT: 20.2 % — ABNORMAL LOW (ref 36.0–46.0)
Hemoglobin: 6.8 g/dL — CL (ref 12.0–15.0)
MCV: 81.1 fL (ref 78.0–100.0)
RBC: 2.49 MIL/uL — ABNORMAL LOW (ref 3.87–5.11)
WBC: 3.9 10*3/uL — ABNORMAL LOW (ref 4.0–10.5)

## 2012-12-11 LAB — RAPID URINE DRUG SCREEN, HOSP PERFORMED
Amphetamines: NOT DETECTED
Barbiturates: NOT DETECTED
Benzodiazepines: NOT DETECTED
Cocaine: NOT DETECTED
Opiates: NOT DETECTED
Tetrahydrocannabinol: POSITIVE — AB

## 2012-12-11 MED ORDER — HALOPERIDOL LACTATE 5 MG/ML IJ SOLN
5.0000 mg | Freq: Three times a day (TID) | INTRAMUSCULAR | Status: DC | PRN
  Administered 2012-12-11: 5 mg via INTRAMUSCULAR
  Filled 2012-12-11: qty 1

## 2012-12-11 MED ORDER — HALOPERIDOL 5 MG PO TABS
5.0000 mg | ORAL_TABLET | Freq: Three times a day (TID) | ORAL | Status: DC | PRN
  Filled 2012-12-11: qty 1

## 2012-12-11 NOTE — Progress Notes (Signed)
<  principal problem not specified>  Assessment: Mentally clearer than reported yesterday. Left ey lids swollen shut. Psych consult started, but appears re-eval to be done today; urine drug + THC  Plan: Could probably go home from trauma standpoint, needs to finish psych eval.   Subjective: No pain, notes eyelids more swollen. Doesn't recall talking to psych yesterday.   Objective: Vital signs in last 24 hours: Temp:  [97.4 F (36.3 C)-99.1 F (37.3 C)] 99.1 F (37.3 C) (09/06 0600) Pulse Rate:  [82-98] 97 (09/06 0600) Resp:  [18-26] 18 (09/06 0600) BP: (109-135)/(62-97) 125/92 mmHg (09/06 0600) SpO2:  [96 %-100 %] 98 % (09/06 0600) Weight:  [227 lb 15.3 oz (103.4 kg)] 227 lb 15.3 oz (103.4 kg) (09/06 0700)    Intake/Output from previous day: 09/05 0701 - 09/06 0700 In: 177.3 [I.V.:177.3] Out: 600 [Urine:600]  General appearance: alert, cooperative and no distress Eyes: left eye lid swelling so eye shut.  Resp: clear to auscultation bilaterally  Lab Results:  Results for orders placed during the hospital encounter of 12/10/12 (from the past 24 hour(s))  URINE RAPID DRUG SCREEN (HOSP PERFORMED)     Status: Abnormal   Collection Time    12/11/12  1:46 AM      Result Value Range   Opiates NONE DETECTED  NONE DETECTED   Cocaine NONE DETECTED  NONE DETECTED   Benzodiazepines NONE DETECTED  NONE DETECTED   Amphetamines NONE DETECTED  NONE DETECTED   Tetrahydrocannabinol POSITIVE (*) NONE DETECTED   Barbiturates NONE DETECTED  NONE DETECTED  CBC     Status: Abnormal   Collection Time    12/11/12  5:55 AM      Result Value Range   WBC 3.9 (*) 4.0 - 10.5 K/uL   RBC 2.49 (*) 3.87 - 5.11 MIL/uL   Hemoglobin 6.8 (*) 12.0 - 15.0 g/dL   HCT 16.1 (*) 09.6 - 04.5 %   MCV 81.1  78.0 - 100.0 fL   MCH 27.3  26.0 - 34.0 pg   MCHC 33.7  30.0 - 36.0 g/dL   RDW 40.9  81.1 - 91.4 %   Platelets 258  150 - 400 K/uL     Studies/Results Radiology     MEDS, Scheduled .  amLODipine  10 mg Oral Daily  . calcitRIOL  0.25 mcg Oral Daily  . furosemide  80 mg Oral BID  . mycophenolate  1,500 mg Oral BID  . pantoprazole  40 mg Oral Daily   Or  . pantoprazole (PROTONIX) IV  40 mg Intravenous Daily  . predniSONE  60 mg Oral Daily  . sodium bicarbonate  650 mg Oral BID       LOS: 1 day    Currie Paris, MD, [facility], [place] [phone]   12/11/2012 10:05 AM
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
## 2012-12-12 DIAGNOSIS — S060X0A Concussion without loss of consciousness, initial encounter: Secondary | ICD-10-CM | POA: Diagnosis not present

## 2012-12-12 MED ORDER — POTASSIUM CHLORIDE CRYS ER 20 MEQ PO TBCR: 20.0000 meq | EXTENDED_RELEASE_TABLET | Freq: Three times a day (TID) | ORAL | Status: DC

## 2012-12-12 MED ORDER — PANTOPRAZOLE SODIUM 40 MG IV SOLR: 40.0000 mg | Freq: Every day | INTRAVENOUS | Status: DC

## 2012-12-12 MED ORDER — PANTOPRAZOLE SODIUM 40 MG PO TBEC
40.0000 mg | DELAYED_RELEASE_TABLET | Freq: Every day | ORAL | Status: DC
  Administered 2012-12-12: 40 mg via ORAL
  Filled 2012-12-12: qty 1

## 2012-12-12 MED ORDER — WHITE PETROLATUM GEL
Status: AC
  Filled 2012-12-12: qty 5

## 2012-12-12 NOTE — Progress Notes (Signed)
  Subjective: Eating some.  No visual disturbance or dizziness.  Objective: Vital signs in last 24 hours: Temp:  [97.4 F (36.3 C)-99 F (37.2 C)] 99 F (37.2 C) (09/07 1029) Pulse Rate:  [76-91] 88 (09/07 1029) Resp:  [18-20] 18 (09/07 1029) BP: (114-125)/(71-83) 120/81 mmHg (09/07 1029) SpO2:  [98 %-100 %] 100 % (09/07 1029)    Intake/Output from previous day: 09/06 0701 - 09/07 0700 In: 840 [P.O.:840] Out: 400 [Urine:400] Intake/Output this shift:    PE: General- In NAD HEENT: Lacerations clean with sutures Neuro:  A & O x 3, follows commands and answers questions appropriately  Lab Results:   Recent Labs  12/10/12 0415 12/11/12 0555  WBC 5.7 3.9*  HGB 6.8* 6.8*  HCT 20.2* 20.2*  PLT 290 258   BMET  Recent Labs  12/10/12 0415  NA 142  K 3.0*  CL 107  CO2 18*  GLUCOSE 103*  BUN 28*  CREATININE 5.37*  CALCIUM 9.9   PT/INR No results found for this basename: LABPROT, INR,  in the last 72 hours Comprehensive Metabolic Panel:    Component Value Date/Time   NA 142 12/10/2012 0415   K 3.0* 12/10/2012 0415   CL 107 12/10/2012 0415   CO2 18* 12/10/2012 0415   BUN 28* 12/10/2012 0415   CREATININE 5.37* 12/10/2012 0415   CREATININE 4.51* 09/13/2012 1612   GLUCOSE 103* 12/10/2012 0415   CALCIUM 9.9 12/10/2012 0415   AST 10 09/28/2012 1756   ALT 11 09/28/2012 1756   ALKPHOS 48 09/28/2012 1756   BILITOT 0.1* 09/28/2012 1756   PROT 6.4 09/28/2012 1756   ALBUMIN 2.7* 11/09/2012 1530     Studies/Results: No results found.  Anti-infectives: Anti-infectives   None      Assessment Active Problems:   Psychosis   Lupus nephritis   Concussion   Facial laceration   Forehead laceration    LOS: 2 days   Plan: Ready for discharge pending Psych eval.   Dontrez Pettis J 12/12/2012
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
## 2012-12-12 NOTE — Progress Notes (Signed)
Discharge patient. Patient discharge instruction given, no question verbalized.

## 2012-12-13 ENCOUNTER — Telehealth (HOSPITAL_COMMUNITY): Payer: Self-pay | Admitting: Emergency Medicine

## 2012-12-13 LAB — TYPE AND SCREEN: Unit division: 0

## 2012-12-13 NOTE — Telephone Encounter (Signed)
9/12 at 10am. Pt doing okay, has pcp and nephrology follow up

## 2012-12-14 ENCOUNTER — Telehealth (HOSPITAL_COMMUNITY): Payer: Self-pay | Admitting: Emergency Medicine

## 2012-12-17 ENCOUNTER — Encounter (INDEPENDENT_AMBULATORY_CARE_PROVIDER_SITE_OTHER): Payer: Medicare HMO
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
## 2012-12-17 NOTE — Discharge Summary (Signed)
Agree with summary. 

## 2012-12-24 ENCOUNTER — Encounter (HOSPITAL_COMMUNITY): Payer: Self-pay | Admitting: Emergency Medicine

## 2012-12-24 ENCOUNTER — Emergency Department (INDEPENDENT_AMBULATORY_CARE_PROVIDER_SITE_OTHER)
Admission: EM | Admit: 2012-12-24 | Discharge: 2012-12-24 | Disposition: A | Discharge status: Home / Self Care | Payer: Medicare HMO | Source: Home / Self Care

## 2012-12-24 DIAGNOSIS — Z4802 Encounter for removal of sutures: Secondary | ICD-10-CM

## 2012-12-24 NOTE — ED Notes (Signed)
Pt here for suture removal from left side of face. Wound appears well healed. No signs of infection. Pt voices no concerns at this time.
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
## 2012-12-28 ENCOUNTER — Ambulatory Visit: Payer: Medicare HMO | Admitting: Family Medicine

## 2012-12-29 NOTE — ED Provider Notes (Signed)
Medical screening examination/treatment/procedure(s) were performed by resident physician or non-physician practitioner and as supervising physician I was immediately available for consultation/collaboration.   Barkley Bruns MD.   Linna Hoff, MD 12/29/12 [phone]

## 2013-02-14 ENCOUNTER — Emergency Department (HOSPITAL_COMMUNITY)
Admission: EM | Admit: 2013-02-14 | Discharge: 2013-02-15 | Disposition: A | Discharge status: Home / Self Care | Payer: No Typology Code available for payment source | Attending: Emergency Medicine | Admitting: Emergency Medicine

## 2013-02-14 ENCOUNTER — Encounter (HOSPITAL_COMMUNITY): Payer: Self-pay | Admitting: Emergency Medicine

## 2013-02-14 DIAGNOSIS — Z79899 Other long term (current) drug therapy: Secondary | ICD-10-CM | POA: Insufficient documentation

## 2013-02-14 DIAGNOSIS — F17200 Nicotine dependence, unspecified, uncomplicated: Secondary | ICD-10-CM | POA: Insufficient documentation

## 2013-02-14 DIAGNOSIS — I10 Essential (primary) hypertension: Secondary | ICD-10-CM | POA: Insufficient documentation

## 2013-02-14 DIAGNOSIS — Y939 Activity, unspecified: Secondary | ICD-10-CM | POA: Insufficient documentation

## 2013-02-14 DIAGNOSIS — B3731 Acute candidiasis of vulva and vagina: Secondary | ICD-10-CM | POA: Insufficient documentation

## 2013-02-14 DIAGNOSIS — B373 Candidiasis of vulva and vagina: Secondary | ICD-10-CM

## 2013-02-14 DIAGNOSIS — M329 Systemic lupus erythematosus, unspecified: Secondary | ICD-10-CM | POA: Insufficient documentation

## 2013-02-14 DIAGNOSIS — Z7982 Long term (current) use of aspirin: Secondary | ICD-10-CM | POA: Insufficient documentation

## 2013-02-14 DIAGNOSIS — Z888 Allergy status to other drugs, medicaments and biological substances status: Secondary | ICD-10-CM | POA: Insufficient documentation

## 2013-02-14 DIAGNOSIS — S335XXA Sprain of ligaments of lumbar spine, initial encounter: Secondary | ICD-10-CM | POA: Insufficient documentation

## 2013-02-14 DIAGNOSIS — IMO0001 Reserved for inherently not codable concepts without codable children: Secondary | ICD-10-CM | POA: Insufficient documentation

## 2013-02-14 DIAGNOSIS — S39012A Strain of muscle, fascia and tendon of lower back, initial encounter: Secondary | ICD-10-CM

## 2013-02-14 DIAGNOSIS — Y92410 Unspecified street and highway as the place of occurrence of the external cause: Secondary | ICD-10-CM | POA: Insufficient documentation

## 2013-02-14 DIAGNOSIS — F29 Unspecified psychosis not due to a substance or known physiological condition: Secondary | ICD-10-CM | POA: Insufficient documentation
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
## 2013-02-15 LAB — GC/CHLAMYDIA PROBE AMP
CT Probe RNA: NEGATIVE
GC Probe RNA: NEGATIVE

## 2013-02-15 LAB — URINALYSIS, ROUTINE W REFLEX MICROSCOPIC
Bilirubin Urine: NEGATIVE
Ketones, ur: NEGATIVE mg/dL
Nitrite: NEGATIVE
Urobilinogen, UA: 0.2 mg/dL (ref 0.0–1.0)
pH: 5.5 (ref 5.0–8.0)

## 2013-02-15 LAB — WET PREP, GENITAL
Trich, Wet Prep: NONE SEEN
WBC, Wet Prep HPF POC: NONE SEEN

## 2013-02-15 LAB — URINE MICROSCOPIC-ADD ON

## 2013-02-15 MED ORDER — METHOCARBAMOL 500 MG PO TABS: 500.0000 mg | ORAL_TABLET | Freq: Two times a day (BID) | ORAL | Status: DC | PRN

## 2013-02-15 MED ORDER — KETOROLAC TROMETHAMINE 60 MG/2ML IM SOLN
60.0000 mg | Freq: Once | INTRAMUSCULAR | Status: AC
  Administered 2013-02-15: 60 mg via INTRAMUSCULAR
  Filled 2013-02-15: qty 2

## 2013-02-15 MED ORDER — FLUCONAZOLE 150 MG PO TABS: 150.0000 mg | ORAL_TABLET | Freq: Once | ORAL | Status: DC

## 2013-02-15 MED ORDER — IBUPROFEN 600 MG PO TABS: 600.0000 mg | ORAL_TABLET | Freq: Four times a day (QID) | ORAL | Status: DC | PRN

## 2013-02-15 MED ORDER — MORPHINE SULFATE 2 MG/ML IJ SOLN: 2.0000 mg | INTRAMUSCULAR | Status: DC | PRN
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
## 2013-03-07 ENCOUNTER — Inpatient Hospital Stay (HOSPITAL_COMMUNITY)
Admission: EM | Admit: 2013-03-07 | Discharge: 2013-03-14 | DRG: 682 | Disposition: A | Discharge status: Home / Self Care | Payer: PRIVATE HEALTH INSURANCE | Attending: Family Medicine | Admitting: Family Medicine

## 2013-03-07 ENCOUNTER — Encounter (HOSPITAL_COMMUNITY): Payer: Self-pay | Admitting: Emergency Medicine

## 2013-03-07 DIAGNOSIS — R609 Edema, unspecified: Secondary | ICD-10-CM

## 2013-03-07 DIAGNOSIS — F259 Schizoaffective disorder, unspecified: Secondary | ICD-10-CM

## 2013-03-07 DIAGNOSIS — R451 Restlessness and agitation: Secondary | ICD-10-CM

## 2013-03-07 DIAGNOSIS — M3214 Glomerular disease in systemic lupus erythematosus: Secondary | ICD-10-CM | POA: Diagnosis present

## 2013-03-07 DIAGNOSIS — Z91199 Patient's noncompliance with other medical treatment and regimen due to unspecified reason: Secondary | ICD-10-CM

## 2013-03-07 DIAGNOSIS — F319 Bipolar disorder, unspecified: Secondary | ICD-10-CM | POA: Diagnosis present

## 2013-03-07 DIAGNOSIS — D649 Anemia, unspecified: Secondary | ICD-10-CM

## 2013-03-07 DIAGNOSIS — F309 Manic episode, unspecified: Secondary | ICD-10-CM

## 2013-03-07 DIAGNOSIS — N19 Unspecified kidney failure: Secondary | ICD-10-CM | POA: Diagnosis present

## 2013-03-07 DIAGNOSIS — N058 Unspecified nephritic syndrome with other morphologic changes: Secondary | ICD-10-CM | POA: Diagnosis present

## 2013-03-07 DIAGNOSIS — R7989 Other specified abnormal findings of blood chemistry: Secondary | ICD-10-CM

## 2013-03-07 DIAGNOSIS — I10 Essential (primary) hypertension: Secondary | ICD-10-CM

## 2013-03-07 DIAGNOSIS — D638 Anemia in other chronic diseases classified elsewhere: Secondary | ICD-10-CM | POA: Diagnosis present

## 2013-03-07 DIAGNOSIS — N185 Chronic kidney disease, stage 5: Secondary | ICD-10-CM | POA: Diagnosis present

## 2013-03-07 DIAGNOSIS — Z9119 Patient's noncompliance with other medical treatment and regimen: Secondary | ICD-10-CM

## 2013-03-07 DIAGNOSIS — M329 Systemic lupus erythematosus, unspecified: Secondary | ICD-10-CM | POA: Diagnosis present

## 2013-03-07 DIAGNOSIS — F17200 Nicotine dependence, unspecified, uncomplicated: Secondary | ICD-10-CM | POA: Diagnosis present

## 2013-03-07 DIAGNOSIS — F29 Unspecified psychosis not due to a substance or known physiological condition: Secondary | ICD-10-CM | POA: Diagnosis present

## 2013-03-07 DIAGNOSIS — I120 Hypertensive chronic kidney disease with stage 5 chronic kidney disease or end stage renal disease: Principal | ICD-10-CM | POA: Diagnosis present

## 2013-03-07 DIAGNOSIS — N186 End stage renal disease: Secondary | ICD-10-CM | POA: Diagnosis present

## 2013-03-07 DIAGNOSIS — R809 Proteinuria, unspecified: Secondary | ICD-10-CM

## 2013-03-07 DIAGNOSIS — E875 Hyperkalemia: Secondary | ICD-10-CM | POA: Diagnosis present

## 2013-03-07 HISTORY — DX: Encounter for supervision of normal pregnancy, unspecified, unspecified trimester: Z34.90

## 2013-03-07 HISTORY — PX: AV FISTULA PLACEMENT: SHX1204

## 2013-03-07 HISTORY — DX: Schizophrenia, unspecified: F20.9

## 2013-03-07 LAB — COMPREHENSIVE METABOLIC PANEL
AST: 10 U/L (ref 0–37)
BUN: 85 mg/dL — ABNORMAL HIGH (ref 6–23)
CO2: 16 mEq/L — ABNORMAL LOW (ref 19–32)
Calcium: 10.7 mg/dL — ABNORMAL HIGH (ref 8.4–10.5)
Chloride: 103 mEq/L (ref 96–112)
Creatinine, Ser: 17.87 mg/dL — ABNORMAL HIGH (ref 0.50–1.10)
GFR calc Af Amer: 3 mL/min — ABNORMAL LOW (ref >90)
GFR calc non Af Amer: 2 mL/min — ABNORMAL LOW (ref >90)
Glucose, Bld: 77 mg/dL (ref 70–99)
Total Bilirubin: 0.3 mg/dL (ref 0.3–1.2)

## 2013-03-07 LAB — RAPID URINE DRUG SCREEN, HOSP PERFORMED
Amphetamines: NOT DETECTED
Opiates: NOT DETECTED

## 2013-03-07 LAB — CBC
HCT: 18.8 % — ABNORMAL LOW (ref 36.0–46.0)
Hemoglobin: 6.3 g/dL — CL (ref 12.0–15.0)
MCH: 27.2 pg (ref 26.0–34.0)
MCV: 81 fL (ref 78.0–100.0)
Platelets: 272 10*3/uL (ref 150–400)
RBC: 2.32 MIL/uL — ABNORMAL LOW (ref 3.87–5.11)
WBC: 4.8 10*3/uL (ref 4.0–10.5)

## 2013-03-07 LAB — SALICYLATE LEVEL: Salicylate Lvl: <2 mg/dL — ABNORMAL LOW (ref 2.8–20.0)

## 2013-03-07 LAB — ETHANOL: Alcohol, Ethyl (B): <11 mg/dL (ref 0–11)

## 2013-03-07 LAB — POTASSIUM: Potassium: 5.3 mEq/L — ABNORMAL HIGH (ref 3.5–5.1)

## 2013-03-07 NOTE — ED Notes (Signed)
CRITICAL VALUE ALERT  Critical value received:  Hgb 6.3  Date of notification:  03/07/13  Time of notification:  1744  Critical value read back:yes MD Horton, notified will advise after reviewed.  Nurse who received alert: Daneil Dan  MD notified (1st page):  MD Horton  Time of first page: 1744  MD notified (2nd page):  Time of second page:  Responding MD:    Time MD responded:

## 2013-03-07 NOTE — ED Notes (Signed)
MD aware of pt decreased Hg and elevated K. No new orders at this time.

## 2013-03-07 NOTE — ED Notes (Signed)
Called floor to give report. RN will call back 

## 2013-03-07 NOTE — ED Notes (Signed)
Pt brought in my police, IVC, papers state pt has extensive mental health treatment in the past. Hx of bipolar, schizophrenia, and lupus. Pt refuses to take any medications. Pt is overly aggressive towards others and verbally abusive. Pt talks about being a mother even tho she has no children.

## 2013-03-07 NOTE — ED Notes (Signed)
Belongings placed in locker 33, inventory in chart

## 2013-03-07 NOTE — ED Notes (Signed)
Bed: [facility] Expected date:  Expected time:  Means of arrival:  Comments: [facility]
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
## 2013-03-07 NOTE — ED Notes (Signed)
Bed: AV40 Expected date:  Expected time:  Means of arrival:  Comments: [facility]

## 2013-03-07 NOTE — ED Notes (Signed)
Patient has three bags of belongings in locker 29. 

## 2013-03-07 NOTE — ED Notes (Signed)
Informed there will be a delay in bed assignment for patient per bed control.

## 2013-03-08 ENCOUNTER — Inpatient Hospital Stay (HOSPITAL_COMMUNITY): Payer: PRIVATE HEALTH INSURANCE

## 2013-03-08 ENCOUNTER — Encounter (HOSPITAL_COMMUNITY): Payer: Self-pay | Admitting: *Deleted

## 2013-03-08 DIAGNOSIS — F29 Unspecified psychosis not due to a substance or known physiological condition: Secondary | ICD-10-CM

## 2013-03-08 DIAGNOSIS — R799 Abnormal finding of blood chemistry, unspecified: Secondary | ICD-10-CM

## 2013-03-08 DIAGNOSIS — N185 Chronic kidney disease, stage 5: Secondary | ICD-10-CM | POA: Diagnosis present

## 2013-03-08 LAB — BASIC METABOLIC PANEL
CO2: 17 mEq/L — ABNORMAL LOW (ref 19–32)
Calcium: 9.9 mg/dL (ref 8.4–10.5)
Chloride: 104 mEq/L (ref 96–112)
Creatinine, Ser: 17.6 mg/dL — ABNORMAL HIGH (ref 0.50–1.10)
Glucose, Bld: 72 mg/dL (ref 70–99)
Potassium: 5.3 mEq/L — ABNORMAL HIGH (ref 3.5–5.1)

## 2013-03-08 LAB — IRON AND TIBC
Iron: 77 ug/dL (ref 42–135)
Saturation Ratios: 34 % (ref 20–55)
UIBC: 148 ug/dL (ref 125–400)

## 2013-03-08 LAB — CBC
MCH: 26.6 pg (ref 26.0–34.0)
MCH: 27.6 pg (ref 26.0–34.0)
MCV: 80.8 fL (ref 78.0–100.0)
MCV: 81.8 fL (ref 78.0–100.0)
Platelets: 234 10*3/uL (ref 150–400)
Platelets: 214 10*3/uL (ref 150–400)
RBC: 2.29 MIL/uL — ABNORMAL LOW (ref 3.87–5.11)
RBC: 2.14 MIL/uL — ABNORMAL LOW (ref 3.87–5.11)
RDW: 14.8 % (ref 11.5–15.5)

## 2013-03-08 LAB — FERRITIN: Ferritin: 674 ng/mL — ABNORMAL HIGH (ref 10–291)

## 2013-03-08 LAB — RENAL FUNCTION PANEL
Albumin: 3.6 g/dL (ref 3.5–5.2)
BUN: 83 mg/dL — ABNORMAL HIGH (ref 6–23)
CO2: 15 mEq/L — ABNORMAL LOW (ref 19–32)
Calcium: 9.9 mg/dL (ref 8.4–10.5)
Calcium: 9.6 mg/dL (ref 8.4–10.5)
Chloride: 101 mEq/L (ref 96–112)
Chloride: 103 mEq/L (ref 96–112)
Creatinine, Ser: 17.81 mg/dL — ABNORMAL HIGH (ref 0.50–1.10)
Creatinine, Ser: 17.8 mg/dL — ABNORMAL HIGH (ref 0.50–1.10)
GFR calc Af Amer: 3 mL/min — ABNORMAL LOW (ref >90)
GFR calc non Af Amer: 2 mL/min — ABNORMAL LOW (ref >90)
Glucose, Bld: 90 mg/dL (ref 70–99)
Glucose, Bld: 101 mg/dL — ABNORMAL HIGH (ref 70–99)
Phosphorus: 9.1 mg/dL — ABNORMAL HIGH (ref 2.3–4.6)
Phosphorus: 8.7 mg/dL — ABNORMAL HIGH (ref 2.3–4.6)
Potassium: 5.2 mEq/L — ABNORMAL HIGH (ref 3.5–5.1)
Sodium: 136 mEq/L (ref 135–145)
Sodium: 138 mEq/L (ref 135–145)

## 2013-03-08 LAB — CREATININE, URINE, RANDOM: Creatinine, Urine: 94.6 mg/dL

## 2013-03-08 LAB — HEPATITIS B CORE ANTIBODY, TOTAL: Hep B Core Total Ab: NONREACTIVE

## 2013-03-08 LAB — HEMOGLOBIN AND HEMATOCRIT, BLOOD: HCT: 20.1 % — ABNORMAL LOW (ref 36.0–46.0)

## 2013-03-08 LAB — HEPATITIS B SURFACE ANTIGEN: Hepatitis B Surface Ag: NEGATIVE

## 2013-03-08 LAB — HEPATITIS B SURFACE ANTIBODY,QUALITATIVE: Hep B S Ab: NEGATIVE

## 2013-03-08 LAB — SODIUM, URINE, RANDOM: Sodium, Ur: 91 mEq/L

## 2013-03-08 LAB — PREPARE RBC (CROSSMATCH)

## 2013-03-08 MED ORDER — SODIUM BICARBONATE 650 MG PO TABS
650.0000 mg | ORAL_TABLET | Freq: Two times a day (BID) | ORAL | Status: DC
Start: 2013-03-08 — End: 2013-03-08
  Administered 2013-03-08: 02:00:00 650 mg via ORAL
  Filled 2013-03-08 (×3): qty 1

## 2013-03-08 MED ORDER — HEPARIN SODIUM (PORCINE) 1000 UNIT/ML IJ SOLN
INTRAMUSCULAR | Status: AC
  Filled 2013-03-08: qty 1

## 2013-03-08 MED ORDER — MYCOPHENOLATE MOFETIL 250 MG PO CAPS
1500.0000 mg | ORAL_CAPSULE | Freq: Two times a day (BID) | ORAL | Status: DC
  Administered 2013-03-08: 02:00:00 1500 mg via ORAL
  Filled 2013-03-08: qty 6

## 2013-03-08 MED ORDER — FENTANYL CITRATE 0.05 MG/ML IJ SOLN
INTRAMUSCULAR | Status: AC
  Filled 2013-03-08: qty 4

## 2013-03-08 MED ORDER — MIDAZOLAM HCL 2 MG/2ML IJ SOLN
INTRAMUSCULAR | Status: AC | PRN
  Administered 2013-03-08: 15:00:00 1 mg via INTRAVENOUS
  Administered 2013-03-08: 0.5 mg via INTRAVENOUS
  Administered 2013-03-08: 1 mg via INTRAVENOUS

## 2013-03-08 MED ORDER — VANCOMYCIN HCL IN DEXTROSE 1-5 GM/200ML-% IV SOLN
1000.0000 mg | Freq: Once | INTRAVENOUS | Status: AC
  Administered 2013-03-08: 16:00:00 1000 mg via INTRAVENOUS
  Filled 2013-03-08 (×2): qty 200

## 2013-03-08 MED ORDER — CALCIUM ACETATE 667 MG PO CAPS
1334.0000 mg | ORAL_CAPSULE | Freq: Three times a day (TID) | ORAL | Status: DC
  Administered 2013-03-09 – 2013-03-14 (×13): 1334 mg via ORAL
  Filled 2013-03-08 (×21): qty 2

## 2013-03-08 MED ORDER — MIDAZOLAM HCL 2 MG/2ML IJ SOLN
INTRAMUSCULAR | Status: AC
  Filled 2013-03-08: qty 4

## 2013-03-08 MED ORDER — HYDROCODONE-ACETAMINOPHEN 5-325 MG PO TABS
1.0000 | ORAL_TABLET | Freq: Four times a day (QID) | ORAL | Status: DC | PRN
  Administered 2013-03-08 – 2013-03-11 (×4): 2 via ORAL
  Administered 2013-03-11 – 2013-03-13 (×2): 1 via ORAL
  Filled 2013-03-08: qty 2
  Filled 2013-03-08: qty 1
  Filled 2013-03-08 (×2): qty 2
  Filled 2013-03-08: qty 1
  Filled 2013-03-08: qty 2

## 2013-03-08 MED ORDER — DARBEPOETIN ALFA-POLYSORBATE 100 MCG/0.5ML IJ SOLN
100.0000 ug | INTRAMUSCULAR | Status: DC
  Administered 2013-03-08: 100 ug via INTRAVENOUS
  Filled 2013-03-08: qty 0.5

## 2013-03-08 MED ORDER — SODIUM CHLORIDE 0.9 % IV SOLN: 250.0000 mL | INTRAVENOUS | Status: DC | PRN

## 2013-03-08 MED ORDER — GELATIN ABSORBABLE 12-7 MM EX MISC
CUTANEOUS | Status: AC
  Filled 2013-03-08: qty 1

## 2013-03-08 MED ORDER — AMLODIPINE BESYLATE 5 MG PO TABS
5.0000 mg | ORAL_TABLET | Freq: Every day | ORAL | Status: DC
  Administered 2013-03-09: 5 mg via ORAL
  Filled 2013-03-08 (×6): qty 1

## 2013-03-08 MED ORDER — DARBEPOETIN ALFA-POLYSORBATE 100 MCG/0.5ML IJ SOLN
INTRAMUSCULAR | Status: AC
  Administered 2013-03-08: 100 ug via INTRAVENOUS
  Filled 2013-03-08: qty 0.5

## 2013-03-08 MED ORDER — SODIUM CHLORIDE 0.9 % IJ SOLN
3.0000 mL | Freq: Two times a day (BID) | INTRAMUSCULAR | Status: DC
  Administered 2013-03-08: 3 mL via INTRAVENOUS

## 2013-03-08 MED ORDER — FENTANYL CITRATE 0.05 MG/ML IJ SOLN
INTRAMUSCULAR | Status: AC | PRN
  Administered 2013-03-08: 25 ug via INTRAVENOUS
  Administered 2013-03-08: 50 ug via INTRAVENOUS

## 2013-03-08 MED ORDER — SODIUM CHLORIDE 0.9 % IJ SOLN: 3.0000 mL | INTRAMUSCULAR | Status: DC | PRN

## 2013-03-08 MED ORDER — HALOPERIDOL LACTATE 2 MG/ML PO CONC
2.0000 mg | Freq: Four times a day (QID) | ORAL | Status: DC | PRN
  Filled 2013-03-08: qty 1

## 2013-03-08 MED ORDER — SODIUM CHLORIDE 0.9 % IJ SOLN
3.0000 mL | Freq: Two times a day (BID) | INTRAMUSCULAR | Status: DC
  Administered 2013-03-08 – 2013-03-10 (×3): 3 mL via INTRAVENOUS

## 2013-03-08 MED ORDER — DIPHENHYDRAMINE HCL 50 MG PO CAPS
50.0000 mg | ORAL_CAPSULE | Freq: Every day | ORAL | Status: DC
  Administered 2013-03-08 – 2013-03-13 (×6): 50 mg via ORAL
  Filled 2013-03-08 (×2): qty 2
  Filled 2013-03-08 (×2): qty 1
  Filled 2013-03-08: qty 2
  Filled 2013-03-08 (×5): qty 1
  Filled 2013-03-08: qty 2

## 2013-03-08 MED ORDER — ZIPRASIDONE MESYLATE 20 MG IM SOLR
20.0000 mg | Freq: Once | INTRAMUSCULAR | Status: DC
  Filled 2013-03-08: qty 20

## 2013-03-08 NOTE — Progress Notes (Signed)
IR aware of request for placement of tunneled HD cath. Chart reviewed. Upon entering room, pt actively arguing and cursing at staff regarding her IVC status and inability to leave hospital to smoke. Per Nephrology note, she is agreeable to start dialysis, but at the time of this note, she is threatening to leave the hospital.  Security arriving. Recommend Primary and Psych teams eval pt to determine status before we will move forward with cathter placement. Please contact IR when it felt she is medically/mentally stable to discuss and proceed with HD catheter placement.   Brayton El PA-C Interventional Radiology 03/08/2013 11:15 AM
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
## 2013-03-08 NOTE — Procedures (Signed)
Pt getting 1st HD via right TDC placed in IR today.   Sitter with pt who is asking to eat and go home (OK to eat, obviously cannot go home). To get a unit of PRBC's - still waiting on blood bank.  Remains unrealistic in her expectation that we can "take care of everything today" but thus far has cooperated with treatment.   Will get 2nd HD tomorrow.

## 2013-03-08 NOTE — Progress Notes (Signed)
Pt admitted to unit via [facility] from [facility] ED. Pt A&O, VS stable, and skin intact. Pt placed on telemetry, call bell within reach, safety video viewed, and currently resting comfortably in bed. Safety sitter on the way to pt's room. Will continue to monitor.
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
## 2013-03-08 NOTE — Procedures (Signed)
Successful RT IJ HD CATH TIP SVC/RA READY FOR USE FULL REPORT IN PACS

## 2013-03-08 NOTE — Progress Notes (Signed)
Pt very guarded and accusatory at staff. Per pt, "You think I'm crazy huh? That I need to take some crazy pills. Well, Dr. Briant Cedar said that I'm off that stuff because of my lupus. I'm not taking haldol or anything else anymore." Pt states that she will be starting on a new medication tonight but is unsure of what it is. Pt requesting for PRN pain medication from HD cath site pain and a sleep aid. Paged Family Medicine. Per Clinton Sawyer MD, he was not aware of this information and to continue monitoring pt at this time. New orders placed for pain and sleep medication at this time. Will continue to monitor. Gilman Schmidt

## 2013-03-08 NOTE — Progress Notes (Signed)
CRITICAL VALUE ALERT  Critical value received:  Hgb 6.1  Date of notification:  03/08/13  Time of notification:  0537  Critical value read back:yes  Nurse who received alert:  Lanora Manis  MD notified (1st page):  Sonnenberg  Time of first page:  [phone]  MD notified (2nd page):  Time of second page:  Responding MD:  Birdie Sons  Time MD responded:  [phone]

## 2013-03-08 NOTE — Progress Notes (Signed)
FMTS Attending Daily Note:  Renold Don MD  [phone] pager  Family Practice pager:  [phone] I have seen and examined this patient and have reviewed their chart. I have discussed this patient with the resident. I agree with the resident's findings, assessment and care plan.  Tobey Grim, MD 03/08/2013 6:19 PM
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
## 2013-03-08 NOTE — H&P (Signed)
FMTS Attending Admission Note: Renold Don MD Personal pager:  [phone] FPTS Service Pager:  [phone]  I  have seen and examined this patient, reviewed their chart. I have discussed this patient with the resident. I agree with the resident's findings, assessment and care plan.  Additionally:  32 yo F with lupus nephritis and now in ESRD.  Complicated by psychiatric history.  Patient more agreeable currently, agitated and violent this AM.  Consent for HD today.  Appreciate renal input.  Currently IVC secondary to family concerns.  Psych has been consulted.     Tobey Grim, MD 03/08/2013 2:52 PM
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
## 2013-03-09 DIAGNOSIS — N19 Unspecified kidney failure: Secondary | ICD-10-CM

## 2013-03-09 DIAGNOSIS — N185 Chronic kidney disease, stage 5: Secondary | ICD-10-CM

## 2013-03-09 LAB — RENAL FUNCTION PANEL
Albumin: 3.6 g/dL (ref 3.5–5.2)
CO2: 20 mEq/L (ref 19–32)
Chloride: 104 mEq/L (ref 96–112)
Creatinine, Ser: 13.9 mg/dL — ABNORMAL HIGH (ref 0.50–1.10)
GFR calc Af Amer: 4 mL/min — ABNORMAL LOW (ref >90)
GFR calc non Af Amer: 3 mL/min — ABNORMAL LOW (ref >90)
Potassium: 4.1 mEq/L (ref 3.5–5.1)
Sodium: 141 mEq/L (ref 135–145)

## 2013-03-09 LAB — TYPE AND SCREEN: ABO/RH(D): O POS

## 2013-03-09 LAB — CBC
Hemoglobin: 7 g/dL — ABNORMAL LOW (ref 12.0–15.0)
MCH: 27 pg (ref 26.0–34.0)
MCV: 79.9 fL (ref 78.0–100.0)
Platelets: 208 10*3/uL (ref 150–400)
RBC: 2.59 MIL/uL — ABNORMAL LOW (ref 3.87–5.11)
WBC: 4.2 10*3/uL (ref 4.0–10.5)

## 2013-03-09 MED ORDER — VANCOMYCIN HCL IN DEXTROSE 1-5 GM/200ML-% IV SOLN
1000.0000 mg | INTRAVENOUS | Status: AC
  Filled 2013-03-09 (×3): qty 200

## 2013-03-09 NOTE — Progress Notes (Signed)
VASCULAR LAB PRELIMINARY  PRELIMINARY  PRELIMINARY  PRELIMINARY  Right  Upper Extremity Vein Map    Cephalic  Segment Diameter Depth Comment  1. Axilla 2.7 mm mm   2. Mid upper arm 3.08 mm mm   3. Above AC 2.54 mm mm   4. In AC 3.28 mm mm   5. Below AC 3.24 mm mm Branch 3.44mm  6. Mid forearm 1.5 mm mm   7. Wrist 1.87 mm mm    mm mm    mm mm    mm mm    Basilic  Segment Diameter Depth Comment  1. Axilla mm mm   2. Mid upper arm 3.54 mm 5.81 mm   3. Above AC 3.33 mm 4.33 mm   4. In AC mm mm   5. Below AC 1.55 mm 2.5mm   6. Mid forearm mm mm   7. Wrist mm mm    mm mm    mm mm    mm mm     Left Upper Extremity Vein Map    Cephalic  Segment Diameter Depth Comment  1. Axilla mm mm Small and thrombosed  2. Mid upper arm mm mm   3. Above AC mm mm   4. In AC mm mm   5. Below AC mm mm   6. Mid forearm mm mm   7. Wrist mm mm    mm mm    mm mm    mm mm    Basilic  Segment Diameter Depth Comment  1. Axilla mm mm   2. Mid upper arm 2.83 mm 8.01 mm   3. Above AC 3.73 mm 4.72 mm   4. In AC mm mm   5. Below AC mm mm   6. Mid forearm mm mm   7. Wrist mm mm    mm mm    mm mm    mm mm     Shakoya Gilmore, RVT 03/09/2013, 10:04 AM

## 2013-03-09 NOTE — Procedures (Signed)
HD #2 today. Well tol thus far.  TDC with BFR 250 DFR 500 2K bath with K of 4.1 Hb 7 Darbe 100 started yesterday

## 2013-03-09 NOTE — Progress Notes (Signed)
[facility] Attending Daily Note:  Renold Don MD  [phone] pager  Family Practice pager:  [phone] I have seen and examined this patient and have reviewed their chart. I have discussed this patient with the resident. I agree with the resident's findings, assessment and care plan.  Additionally:  - Floridly manic today.  Refusing any pschotropic medications.  Psych has been consulted, appreciate any recommendations for her.  This will be overriding issue for this hospitalization as she's been IVC'ed. - Greatly appreciate Renal recommendations and dialysis.  Tobey Grim, MD 03/09/2013 2:43 PM
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
## 2013-03-09 NOTE — Consult Note (Signed)
I agree with the above.  I have seen and examined the patient.  She is right handed with palpable radial and brachial pulses.  Vein mapping shows adequate cephaliv vein from the [facility] and higher.  I have discussed proceeding with a right upperarm fistula on Thursday.  Risks and benefits ere discussed including steal syndrome, need for future interventions and long term patency.   Durene Cal
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
## 2013-03-09 NOTE — Progress Notes (Signed)
Subjective:  Tolerated 1st HD well yesterday via new TDC For HD #2 today Keeps talking over and over about "I took my medicines - I need to get out of here and get my mind settled" To get AVF tomorrow by [facility] No outpt HD assignment yet  Objective Vital signs in last 24 hours: Filed Vitals:   03/08/13 1813 03/08/13 2145 03/09/13 0644 03/09/13 0920  BP: 113/83 114/81 127/82 111/81  Pulse: 100 120 64 62  Temp: 98.5 F (36.9 C) 98.4 F (36.9 C) 97.4 F (36.3 C) 98.2 F (36.8 C)  TempSrc: Oral Oral Axillary Oral  Resp: 20 20 20 20   Height:      Weight:   67.586 kg (149 lb)   SpO2: 100% 100% 100% 100%   Weight change: 0.275 kg (9.7 oz)  Intake/Output Summary (Last 24 hours) at 03/09/13 1255 Last data filed at 03/09/13 0920  Gross per 24 hour  Intake    685 ml  Output    450 ml  Net    235 ml   Physical Exam:  BP 111/81  Pulse 62  Temp(Src) 98.2 F (36.8 C) (Oral)  Resp 20  Ht 5\' 9"  (1.753 m)  Wt 67.586 kg (149 lb)  BMI 21.99 kg/m2  SpO2 100% Tall slender AAF TDC Right IJ site clean and dry Clear lungs No rub No edema Labs: Basic Metabolic Panel:  Recent Labs Lab 03/07/13 1703 03/07/13 2000 03/08/13 0105 03/08/13 0415 03/08/13 1634 03/09/13 0542  NA 137  --  139 136 138 141  K 5.3* 5.3* 5.3* 5.0 5.2* 4.1  CL 103  --  104 101 103 104  CO2 16*  --  17* 15* 15* 20  GLUCOSE 77  --  72 90 101* 72  BUN 85*  --  84* 83* 83* 59*  CREATININE 17.87*  --  17.60* 17.81* 17.80* 13.90*  CALCIUM 10.7*  --  9.9 9.9 9.6 9.6  PHOS  --   --   --  9.1* 8.7* 8.3*    Recent Labs Lab 03/07/13 1703 03/08/13 0415 03/08/13 1634 03/09/13 0542  AST 10  --   --   --   ALT 7  --   --   --   ALKPHOS 43  --   --   --   BILITOT 0.3  --   --   --   PROT 7.9  --   --   --   ALBUMIN 4.2 3.5 3.6 3.6    Recent Labs Lab 03/07/13 1703 03/08/13 0415 03/08/13 0801 03/08/13 1219 03/08/13 1633 03/09/13 0542  WBC 4.8 5.5  --   --  4.9 4.2  HGB 6.3* 6.1* 6.6* 6.5* 5.9* 7.0*   HCT 18.8* 18.5* 19.6* 20.1* 17.5* 20.7*  MCV 81.0 80.8  --   --  81.8 79.9  PLT 272 234  --   --  214 208  Iron Studies:  Recent Labs Lab 03/08/13 1633  IRON 77  TIBC 225*  FERRITIN 674*   Studies/Results: Ir Fluoro Guide Cv Line Right  03/08/2013   CLINICAL DATA:  Lupus nephritis, END-STAGE RENAL DISEASE  EXAM: ULTRASOUND GUIDANCE FOR VASCULAR ACCESS  RIGHT INTERNAL JUGULAR PERMANENT HEMODIALYSIS CATHETER  Date:  12/2/201412/05/2012 3:28 PM  Radiologist:  Judie Petit. Ruel Favors, MD  Guidance:  ULTRASOUND AND FLUOROSCOPIC  MEDICATIONS AND MEDICAL HISTORY: 1 G VANCOMYCIN ADMINISTERED WITHIN 1 HR OF THE PROCEDURE, VERSED AND FENTANYL FOR CONSCIOUS SEDATION  ANESTHESIA/SEDATION: 15 min  CONTRAST:  None.  FLUOROSCOPY TIME:  18 SECONDS  PROCEDURE: Informed consent was obtained from the patient following explanation of the procedure, risks, benefits and alternatives. The patient understands, agrees and consents for the procedure. All questions were addressed. A time out was performed.  Maximal barrier sterile technique utilized including caps, mask, sterile gowns, sterile gloves, large sterile drape, hand hygiene, and 2% chlorhexidine scrub.  Under sterile conditions and local anesthesia, right internal jugular micropuncture venous access was performed with ultrasound. Images were obtained for documentation. A guide wire was inserted followed by a transitional dilator. Next, a 0.035 guidewire was advanced into the IVC with a 5-French catheter. Measurements were obtained from the Right venotomy site to the proximal right atrium. In the right infraclavicular chest, a subcutaneous tunnel was created under sterile conditions and local anesthesia. 1% lidocaine with epinephrine was utilized for this. The 19 cm tip to cuff hemo split catheter was tunneled subcutaneously to the venotomy site and inserted into the SVC/RA junction through a valved peel-away sheath. Position was confirmed with fluoroscopy. Images were  obtained for documentation. Blood was aspirated from the catheter followed by saline and heparin flushes. The appropriate volume and strength of heparin was instilled in each lumen. Caps were applied. The catheter was secured at the tunnel site with Gelfoam and a pursestring suture. The venotomy site was closed with subcuticular Vicryl suture. Dermabond was applied to the small right neck incision. A dry sterile dressing was applied. The catheter is ready for use. No immediate complications.  COMPLICATIONS: No immediate  IMPRESSION: Ultrasound and fluoroscopically guided right internal jugular tunneled hemodialysis catheter (19 cm tip to cuff hemo split catheter).   Electronically Signed   By: Ruel Favors M.D.   On: 03/08/2013 16:25   Ir US Guide Vasc Access Right  03/08/2013   CLINICAL DATA:  Lupus nephritis, END-STAGE RENAL DISEASE  EXAM: ULTRASOUND GUIDANCE FOR VASCULAR ACCESS  RIGHT INTERNAL JUGULAR PERMANENT HEMODIALYSIS CATHETER  Date:  12/2/201412/05/2012 3:28 PM  Radiologist:  Judie Petit. Ruel Favors, MD  Guidance:  ULTRASOUND AND FLUOROSCOPIC  MEDICATIONS AND MEDICAL HISTORY: 1 G VANCOMYCIN ADMINISTERED WITHIN 1 HR OF THE PROCEDURE, VERSED AND FENTANYL FOR CONSCIOUS SEDATION  ANESTHESIA/SEDATION: 15 min  CONTRAST:  None.  FLUOROSCOPY TIME:  18 SECONDS  PROCEDURE: Informed consent was obtained from the patient following explanation of the procedure, risks, benefits and alternatives. The patient understands, agrees and consents for the procedure. All questions were addressed. A time out was performed.  Maximal barrier sterile technique utilized including caps, mask, sterile gowns, sterile gloves, large sterile drape, hand hygiene, and 2% chlorhexidine scrub.  Under sterile conditions and local anesthesia, right internal jugular micropuncture venous access was performed with ultrasound. Images were obtained for documentation. A guide wire was inserted followed by a transitional dilator. Next, a 0.035 guidewire was  advanced into the IVC with a 5-French catheter. Measurements were obtained from the Right venotomy site to the proximal right atrium. In the right infraclavicular chest, a subcutaneous tunnel was created under sterile conditions and local anesthesia. 1% lidocaine with epinephrine was utilized for this. The 19 cm tip to cuff hemo split catheter was tunneled subcutaneously to the venotomy site and inserted into the SVC/RA junction through a valved peel-away sheath. Position was confirmed with fluoroscopy. Images were obtained for documentation. Blood was aspirated from the catheter followed by saline and heparin flushes. The appropriate volume and strength of heparin was instilled in each lumen. Caps were applied. The catheter was secured at the tunnel site with Gelfoam  and a pursestring suture. The venotomy site was closed with subcuticular Vicryl suture. Dermabond was applied to the small right neck incision. A dry sterile dressing was applied. The catheter is ready for use. No immediate complications.  COMPLICATIONS: No immediate  IMPRESSION: Ultrasound and fluoroscopically guided right internal jugular tunneled hemodialysis catheter (19 cm tip to cuff hemo split catheter).   Electronically Signed   By: Ruel Favors M.D.   On: 03/08/2013 16:25   Medications:   . amLODipine  5 mg Oral Daily  . calcium acetate  1,334 mg Oral TID WC  . darbepoetin (ARANESP) injection - DIALYSIS  100 mcg Intravenous Q Tue-HD  . diphenhydrAMINE  50 mg Oral QHS  . sodium chloride  3 mL Intravenous Q12H  . sodium chloride  3 mL Intravenous Q12H  . [START ON 03/10/2013] vancomycin  1,000 mg Intravenous To OR   Impression/Plan  1. 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 Unclear if took cellcept regularly or not, but at best creatinine only improved to around 4.6 or so - and clearly at ESRD now (has been slow progression which is why so well tolerated). S/p TDC by IR on 12/2. For AVF by [facility] on 12/4. CLIP process started. For  HD #2 today. Dialysis education requested 2. Anemia - Transfused 1 unit on HD 12/2; started Darbepoetin 100/week yesterday; iron stores replete 3. CKD-MBD - Binders started; PTH pending 4. HTN - meds 5. Psyche issues (schizophrenia/bipolar) - per primary and psychiatry; currently IVC status; unclear to me what the psyche plans are    Camille Bal, MD [facility] [phone] pager 03/09/2013, 12:55 PM

## 2013-03-10 ENCOUNTER — Other Ambulatory Visit: Payer: Self-pay | Admitting: *Deleted

## 2013-03-10 ENCOUNTER — Inpatient Hospital Stay (HOSPITAL_COMMUNITY): Payer: PRIVATE HEALTH INSURANCE | Admitting: Certified Registered Nurse Anesthetist

## 2013-03-10 ENCOUNTER — Encounter (HOSPITAL_COMMUNITY): Payer: Self-pay | Admitting: Certified Registered Nurse Anesthetist

## 2013-03-10 ENCOUNTER — Encounter (HOSPITAL_COMMUNITY): Payer: PRIVATE HEALTH INSURANCE | Admitting: Certified Registered Nurse Anesthetist

## 2013-03-10 ENCOUNTER — Other Ambulatory Visit: Payer: Self-pay

## 2013-03-10 ENCOUNTER — Encounter (HOSPITAL_COMMUNITY)
Admission: EM | Disposition: A | Discharge status: Home / Self Care | Payer: Self-pay | Source: Home / Self Care | Attending: Family Medicine

## 2013-03-10 DIAGNOSIS — N186 End stage renal disease: Secondary | ICD-10-CM

## 2013-03-10 HISTORY — PX: AV FISTULA PLACEMENT: SHX1204

## 2013-03-10 LAB — CBC
HCT: 21.4 % — ABNORMAL LOW (ref 36.0–46.0)
Hemoglobin: 7.2 g/dL — ABNORMAL LOW (ref 12.0–15.0)
MCHC: 33.6 g/dL (ref 30.0–36.0)
MCV: 80.5 fL (ref 78.0–100.0)
Platelets: 191 10*3/uL (ref 150–400)
RBC: 2.66 MIL/uL — ABNORMAL LOW (ref 3.87–5.11)
WBC: 3.3 10*3/uL — ABNORMAL LOW (ref 4.0–10.5)

## 2013-03-10 LAB — BASIC METABOLIC PANEL
BUN: 26 mg/dL — ABNORMAL HIGH (ref 6–23)
CO2: 24 mEq/L (ref 19–32)
Calcium: 9.8 mg/dL (ref 8.4–10.5)
Chloride: 100 mEq/L (ref 96–112)
Potassium: 3.7 mEq/L (ref 3.5–5.1)
Sodium: 138 mEq/L (ref 135–145)

## 2013-03-10 SURGERY — ARTERIOVENOUS (AV) FISTULA CREATION
Anesthesia: Monitor Anesthesia Care | Site: Arm Lower | Laterality: Right

## 2013-03-10 MED ORDER — ONDANSETRON HCL 4 MG/2ML IJ SOLN: 4.0000 mg | Freq: Once | INTRAMUSCULAR | Status: DC | PRN

## 2013-03-10 MED ORDER — THROMBIN 20000 UNITS EX SOLR
CUTANEOUS | Status: AC
  Filled 2013-03-10: qty 20000

## 2013-03-10 MED ORDER — SODIUM CHLORIDE 0.9 % IV SOLN
INTRAVENOUS | Status: DC
  Administered 2013-03-10: 10:00:00 via INTRAVENOUS

## 2013-03-10 MED ORDER — FENTANYL CITRATE 0.05 MG/ML IJ SOLN
INTRAMUSCULAR | Status: DC | PRN
  Administered 2013-03-10 (×9): 25 ug via INTRAVENOUS
  Administered 2013-03-10: 50 ug via INTRAVENOUS

## 2013-03-10 MED ORDER — LIDOCAINE HCL (PF) 1 % IJ SOLN
INTRAMUSCULAR | Status: DC | PRN
  Administered 2013-03-10: 12 mL via INTRADERMAL

## 2013-03-10 MED ORDER — ONDANSETRON HCL 4 MG/2ML IJ SOLN
INTRAMUSCULAR | Status: DC | PRN
  Administered 2013-03-10: 4 mg via INTRAVENOUS

## 2013-03-10 MED ORDER — LIDOCAINE HCL (PF) 1 % IJ SOLN
INTRAMUSCULAR | Status: AC
  Filled 2013-03-10: qty 30

## 2013-03-10 MED ORDER — PROPOFOL INFUSION 10 MG/ML OPTIME
INTRAVENOUS | Status: DC | PRN
  Administered 2013-03-10: 100 ug/kg/min via INTRAVENOUS
  Administered 2013-03-10: 12:00:00 via INTRAVENOUS

## 2013-03-10 MED ORDER — 0.9 % SODIUM CHLORIDE (POUR BTL) OPTIME
TOPICAL | Status: DC | PRN
  Administered 2013-03-10: 1000 mL

## 2013-03-10 MED ORDER — SODIUM CHLORIDE 0.9 % IV SOLN
INTRAVENOUS | Status: DC | PRN
  Administered 2013-03-10: 11:00:00 via INTRAVENOUS

## 2013-03-10 MED ORDER — HYDROMORPHONE HCL PF 1 MG/ML IJ SOLN: 0.2500 mg | INTRAMUSCULAR | Status: DC | PRN

## 2013-03-10 MED ORDER — VANCOMYCIN HCL IN DEXTROSE 1-5 GM/200ML-% IV SOLN
INTRAVENOUS | Status: AC
  Administered 2013-03-10: 1000 mg via INTRAVENOUS
  Filled 2013-03-10: qty 200

## 2013-03-10 MED ORDER — OXYCODONE-ACETAMINOPHEN 5-325 MG PO TABS
1.0000 | ORAL_TABLET | ORAL | Status: DC | PRN
  Administered 2013-03-12 – 2013-03-13 (×3): 2 via ORAL
  Filled 2013-03-10 (×3): qty 2

## 2013-03-10 MED ORDER — PROTAMINE SULFATE 10 MG/ML IV SOLN
INTRAVENOUS | Status: DC | PRN
  Administered 2013-03-10 (×3): 10 mg via INTRAVENOUS

## 2013-03-10 MED ORDER — SODIUM CHLORIDE 0.9 % IR SOLN
Status: DC | PRN
  Administered 2013-03-10: 10:00:00

## 2013-03-10 MED ORDER — LIDOCAINE HCL (CARDIAC) 20 MG/ML IV SOLN
INTRAVENOUS | Status: DC | PRN
  Administered 2013-03-10 (×2): 50 mg via INTRAVENOUS

## 2013-03-10 MED ORDER — HEPARIN SODIUM (PORCINE) 1000 UNIT/ML IJ SOLN
INTRAMUSCULAR | Status: DC | PRN
  Administered 2013-03-10: 5000 [IU] via INTRAVENOUS

## 2013-03-10 SURGICAL SUPPLY — 43 items
ADH SKN CLS APL DERMABOND .7 (GAUZE/BANDAGES/DRESSINGS) ×1
ARMBAND PINK RESTRICT EXTREMIT (MISCELLANEOUS) ×3 IMPLANT
CANISTER SUCTION 2500CC (MISCELLANEOUS) ×2 IMPLANT
CLIP TI MEDIUM 6 (CLIP) ×2 IMPLANT
CLIP TI WIDE RED SMALL 6 (CLIP) ×2 IMPLANT
COVER PROBE W GEL 5X96 (DRAPES) IMPLANT
COVER SURGICAL LIGHT HANDLE (MISCELLANEOUS) ×2 IMPLANT
DECANTER SPIKE VIAL GLASS SM (MISCELLANEOUS) ×1 IMPLANT
DERMABOND ADVANCED (GAUZE/BANDAGES/DRESSINGS) ×1
DERMABOND ADVANCED .7 DNX12 (GAUZE/BANDAGES/DRESSINGS) ×1 IMPLANT
DRAIN PENROSE 1/2X12 LTX STRL (WOUND CARE) IMPLANT
ELECT REM PT RETURN 9FT ADLT (ELECTROSURGICAL) ×2
ELECTRODE REM PT RTRN 9FT ADLT (ELECTROSURGICAL) ×1 IMPLANT
GLOVE BIO SURGEON STRL SZ7.5 (GLOVE) ×2 IMPLANT
GLOVE BIOGEL PI IND STRL 6.5 (GLOVE) IMPLANT
GLOVE BIOGEL PI IND STRL 7.0 (GLOVE) IMPLANT
GLOVE BIOGEL PI IND STRL 8 (GLOVE) ×1 IMPLANT
GLOVE BIOGEL PI INDICATOR 6.5 (GLOVE) ×1
GLOVE BIOGEL PI INDICATOR 7.0 (GLOVE) ×2
GLOVE BIOGEL PI INDICATOR 8 (GLOVE) ×2
GLOVE ECLIPSE 6.5 STRL STRAW (GLOVE) ×1 IMPLANT
GLOVE ECLIPSE 7.5 STRL STRAW (GLOVE) ×2 IMPLANT
GLOVE SS BIOGEL STRL SZ 6.5 (GLOVE) IMPLANT
GLOVE SUPERSENSE BIOGEL SZ 6.5 (GLOVE) ×1
GOWN STRL NON-REIN LRG LVL3 (GOWN DISPOSABLE) ×6 IMPLANT
GOWN STRL REIN XL XLG (GOWN DISPOSABLE) ×2 IMPLANT
KIT BASIN OR (CUSTOM PROCEDURE TRAY) ×2 IMPLANT
KIT ROOM TURNOVER OR (KITS) ×2 IMPLANT
NDL HYPO 25GX1X1/2 BEV (NEEDLE) ×1 IMPLANT
NEEDLE HYPO 25GX1X1/2 BEV (NEEDLE) ×2 IMPLANT
NS IRRIG 1000ML POUR BTL (IV SOLUTION) ×2 IMPLANT
PACK CV ACCESS (CUSTOM PROCEDURE TRAY) ×2 IMPLANT
PAD ARMBOARD 7.5X6 YLW CONV (MISCELLANEOUS) ×4 IMPLANT
SPONGE GAUZE 4X4 12PLY (GAUZE/BANDAGES/DRESSINGS) ×2 IMPLANT
SPONGE SURGIFOAM ABS GEL 100 (HEMOSTASIS) IMPLANT
SUT PROLENE 6 0 BV (SUTURE) ×2 IMPLANT
SUT VIC AB 3-0 SH 27 (SUTURE) ×2
SUT VIC AB 3-0 SH 27X BRD (SUTURE) ×1 IMPLANT
SUT VICRYL 4-0 PS2 18IN ABS (SUTURE) ×2 IMPLANT
TOWEL OR 17X24 6PK STRL BLUE (TOWEL DISPOSABLE) ×2 IMPLANT
TOWEL OR 17X26 10 PK STRL BLUE (TOWEL DISPOSABLE) ×2 IMPLANT
UNDERPAD 30X30 INCONTINENT (UNDERPADS AND DIAPERS) ×2 IMPLANT
WATER STERILE IRR 1000ML POUR (IV SOLUTION) ×2 IMPLANT

## 2013-03-10 NOTE — Progress Notes (Signed)
Patient ID: Sara [name], female   DOB: 06/08/1980, 32 y.o.   MRN: [id number]  Psychiatry consultation attempted. Patient went to dialysis and not available last evening and will see this afternoon.Darrol Jump R. 03/10/2013 8:57 AM

## 2013-03-10 NOTE — Progress Notes (Signed)
Pt not yet seen on AM rounds as in the OR for AVF Please Note: until she is deemed stable by psychiatry and they have a viable treatment plan for her that she agrees to adhere to,  I am advised that she cannot be accepted at an outpatient dialysis facility.
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
## 2013-03-10 NOTE — Transfer of Care (Signed)
Immediate Anesthesia Transfer of Care Note  Patient: Sara [name]  Procedure(s) Performed: Procedure(s): ARTERIOVENOUS (AV) FISTULA CREATION VS GRAFT INSERTION (Right)  Patient Location: PACU  Anesthesia Type:MAC  Level of Consciousness: awake, alert , oriented, patient cooperative and responds to stimulation  Airway & Oxygen Therapy: Patient Spontanous Breathing  Post-op Assessment: Report given to PACU RN, Post -op Vital signs reviewed and stable and Patient moving all extremities X 4  Post vital signs: Reviewed and stable  Complications: No apparent anesthesia complications

## 2013-03-10 NOTE — Progress Notes (Signed)
Provided patient with printed education sheets on the following: Renal diet, renal failure, A/V access and dialysis.  Patient agreed to review information and notify the nurse is she has any questions.  Reinforced strict adherance to renal diet and fluid restriction.  Peri Maris, MBA, BS, RN

## 2013-03-10 NOTE — Interval H&P Note (Signed)
History and Physical Interval Note:  03/10/2013 10:04 AM  Sara [name]  has presented today for surgery, with the diagnosis of ESRD  The various methods of treatment have been discussed with the patient and family. After consideration of risks, benefits and other options for treatment, the patient has consented to  Procedure(s): ARTERIOVENOUS (AV) FISTULA CREATION VS GRAFT INSERTION (Left) as a surgical intervention .  The patient's history has been reviewed, patient examined, no change in status, stable for surgery.  I have reviewed the patient's chart and labs.  Questions were answered to the patient's satisfaction.     Jazlyn Tippens S

## 2013-03-10 NOTE — Progress Notes (Signed)
Chaplain offered ministry of presence, emotional and spiritual support. Patient requests prayer. Chaplain will follow up if needed or requested.   03/10/13 1400  Clinical Encounter Type  Visited With Patient  Visit Type Initial;Social support

## 2013-03-10 NOTE — Op Note (Signed)
    NAME: Sara [name]   MRN: [id number] DOB: Dec 12, 1980    DATE OF OPERATION: 03/10/2013  PREOP DIAGNOSIS: Stage V CKD   POSTOP DIAGNOSIS: Same  PROCEDURE: Right Brachial Cephalic AVF  SURGEON: Di Kindle. Edilia Bo, MD, FACS  ASSIST: Della Goo, PA  ANESTHESIA: local with sedation   EBL: min  INDICATIONS: MACHEL VIOLANTE is a 32 y.o. female who has just begun HD. She has a right IJ Diatek catheter.   FINDINGS: 3.0 mm cephalic vein. 3.5 mm brachial artery  TECHNIQUE: The patient was taken to the operating room and sedated by anesthesia. Right upper extremity was prepped and draped in the usual sterile fashion. A transverse incision was made just above the antecubital level after the skin was anesthetized. The cephalic vein was dissected free. At the antecubital level it became sclerotic. However above this level it was patent although somewhat small. The brachial artery was dissected free beneath the fascia. There was no basilic vein and no adequate forearm cephalic vein. Given that the only other remaining option the right arm was in upper arm graft I elected to proceed with a brachiocephalic fistula. The patient was heparinized. The brachial artery was clamped proximally and distally a longitudinal arteriotomy was made. The vein was mobilized over and sewn end to side to the artery using continuous 6-0 Prolene suture. At the completion was excellent thrill in the fistula and a palpable radial pulse. Hemostasis was obtained in the wound and the heparin was partially reversed with protamine. The wound was closed with deep layer 3-0 Vicryl and the skin closed with 4-0 Vicryl. Dermabond was applied. She tolerated the procedure well and was transferred to the recovery room in stable condition. All needle and sponge counts were correct.  Waverly Ferrari, MD, FACS [facility]  DATE OF DICTATION:   03/10/2013

## 2013-03-10 NOTE — Progress Notes (Addendum)
Family Medicine Teaching Service Daily Progress Note Intern Pager: [phone]  Patient name: Sara [name] Medical record number: [id number] Date of birth: February 18, 1981 Age: 32 y.o. Gender: female  Primary Care Provider: Marikay Alar, MD Consultants: Psychiatry Code Status: Full  Pt Overview and Major Events to Date:  03/08/13 - IVC pending further psych eval / Hgb 6.1-->6.6-->6.5 / Cr 17.81 / Phos 9.1 03/09/13 - vein mapping and HD (12/2) started 03/10/13 - surgery planned for access  Assessment and Plan: Sara [name] is a 32 y.o. female presenting with IVC found to have worsened renal function . PMH is significant for lupus, CKD stage 5, HTN, schizophrenia.  # CKD stage 5, secondary to progressive SLE nephritis: patient with lupus nephritis and history of non-compliance presenting with elevation of Cr to 17.87. She is followed by Dr Briant Cedar of [facility]. She is supposed to be on cellcept, calcitriol, sodium bicarb, and lasix at home. Uncertain how compliant she has been with these medications. S/p 1x dose cellcept (DC'd). Hold Calcitriol (Ca 10.7) - c/s Nephrology ([facility])   - HD x2days   - plan for vascular surgery to work on HD access  today  -IR placed temp access 12/2  -PTH 201 (high), initiate phosphate binders TID -monitor renal panel daily    # hyperkalemia: resolved with HD  # anemia: patient with hgb 6.3. Baseline is between 6-7. Is currently asymptomatic. Non-microcytic and not iron deficient, though with low TIBC and elevated ferritin may be related to anemia of chronic disease and related to her kidney disease. - will continue to trend hgb, improving trend hgb 6.1-->6.6-->6.5-->7.0-->7.2 s/p transfusion 1 u with dialysis - start Aranesp weekly - iron panel revealed iron 77, TIBC 225, ferritin 674 indicating probable anemia of chronic disease likely 2/2 renal failure  # schizophrenia: patient to be IVC'd and awaiting medical  clearance.  Pt difficult historian.  She endorses risperdal shots q2weeks, although has not gotten them in several weeks. - c/s Psychiatry for evaluation, pending further updates on status-will await their recs for psych treatment once medically stable - UDS, ETOH, salicylate level, and tylenol level normal - consider resuming prior home dosing Haldol 5mg  daily, although this is in previous hospital notes, pt does not endorse ever having this regimen   FEN/GI: renal diet, SLIV  Prophylaxis: scd's, no heparin given low hgb.  Disposition: discharge pending psych eval and dialysis access placement  Subjective: pt more appropriate today.  She accepts her new diagnosis of ESRD and is ready to have access placed by vascular surgery today.  She stated that she was not taking any psychiatric medications because her kidney doctor advised against it.  She mentioned that she is currently homeless and looking for somewhere to live.    Objective: Temp:  [98 F (36.7 C)-98.9 F (37.2 C)] 98 F (36.7 C) (12/04 0535) Pulse Rate:  [61-89] 62 (12/04 0535) Resp:  [18] 18 (12/04 0535) BP: (103-132)/(68-87) 107/74 mmHg (12/04 0535) SpO2:  [99 %-100 %] 100 % (12/04 0535) Weight:  [146 lb 6.2 oz (66.4 kg)] 146 lb 6.2 oz (66.4 kg) (12/03 1835) Physical Exam: General: NAD, sitting in bed Cardiovascular: RRR, no murmurs heard Respiratory: CTAB Extremities: non-tender, no edema Neuro: AAO x 3, grossly non-focal  Laboratory:  Recent Labs Lab 03/08/13 1633 03/09/13 0542 03/10/13 0500  WBC 4.9 4.2 3.3*  HGB 5.9* 7.0* 7.2*  HCT 17.5* 20.7* 21.4*  PLT 214 208 191    Recent Labs Lab 03/07/13 1703  03/08/13 1634 03/09/13 0542 03/10/13 0500  NA 137  < > 138 141 138  K 5.3*  < > 5.2* 4.1 3.7  CL 103  < > 103 104 100  CO2 16*  < > 15* 20 24  BUN 85*  < > 83* 59* 26*  CREATININE 17.87*  < > 17.80* 13.90* 8.82*  CALCIUM 10.7*  < > 9.6 9.6 9.8  PROT 7.9  --   --   --   --   BILITOT 0.3  --   --   --    --   ALKPHOS 43  --   --   --   --   ALT 7  --   --   --   --   AST 10  --   --   --   --   GLUCOSE 77  < > 101* 72 75  < > = values in this interval not displayed. Phos 9.1  Imaging/Diagnostic Tests:  None  Rayburn Ma, Med Student 03/10/2013, 9:24 AM  Upper Level Addendum:  I have seen and evaluated this patient along with the MS4 and reviewed and agree with the above note.  General: NAD, sitting in bed Cardiovascular: RRR, no murmurs heard Respiratory: CTAB Ext: no edema Neuro: no focal deficits Psych: less pressured today, less tangential  Assessment and Plan:  Sara [name] is a 32 y.o. female presenting with IVC found to have worsened renal function . PMH is significant for lupus, CKD stage 5, HTN, schizophrenia.   # CKD stage 5, secondary to progressive SLE nephritis: patient with lupus nephritis and history of non-compliance presenting with elevation of Cr to 17.87. She is followed by Dr Briant Cedar of [facility].  -c/s Nephrology [facility]) appreciate their assistance   -s/p HD #2 yesterday, to be set up for outpatient HD  -IR placed temp access, to have perm access placed today  -VVS consulted and to have perm access placed today-getting preop vanc -PTH elevated, will likely go back on calcitriol, initiate phosphate binders TID  -monitor renal panel daily   -hep B studies negative  # anemia: patient with hgb 6.3. Baseline is between 6-7. Is currently asymptomatic. Non-microcytic and not iron deficient, though with low TIBC and elevated ferritin may be related to anemia of chronic disease and related to her kidney disease.  - will continue to trend hgb, improving trend hgb 6.1-->6.6-->6.5-->7.0 s/p transfusion 1 u with dialysis-->7.2  - start Aranesp weekly  - iron panel revealed iron 77, TIBC 225, ferritin 674   # schizophrenia: patient to be IVC'd and awaiting medical clearance. Patient psych status improved today. - c/s  Psychiatry for evaluation, pending further updates on status-to see today - UDS, ETOH, salicylate level, and tylenol level normal  - consider resuming prior home dosing Haldol 5mg  daily   FEN/GI: renal diet, SLIV  Prophylaxis: scd's, no heparin given low hgb.   Disposition: discharge pending psych eval and dialysis access placement, CSW c/s placed as patient states is homeless   Marikay Alar, MD [facility] Medicine PGY-2

## 2013-03-10 NOTE — Progress Notes (Signed)
Patient ID: Sara [name], female   DOB: [birth year], 32 y.o.   MRN: [id number]  Psychiatry consultation attempted this early afternoon without success. Spoke with Retail banker. Patient was sent to OR for fistula which is required for treatments. Will try again later or in am.  Sara [name],Sara R. 03/10/2013 12:50 PM

## 2013-03-10 NOTE — Anesthesia Postprocedure Evaluation (Signed)
  Anesthesia Post-op Note  Patient: Sara [name]  Procedure(s) Performed: Procedure(s): ARTERIOVENOUS (AV) FISTULA CREATION VS GRAFT INSERTION (Right)  Patient Location: PACU  Anesthesia Type:MAC  Level of Consciousness: awake, alert , oriented and patient cooperative  Airway and Oxygen Therapy: Patient Spontanous Breathing  Post-op Pain: mild  Post-op Assessment: Post-op Vital signs reviewed, Patient's Cardiovascular Status Stable, Respiratory Function Stable, Patent Airway, No signs of Nausea or vomiting and Pain level controlled  Post-op Vital Signs: stable  Complications: No apparent anesthesia complications

## 2013-03-10 NOTE — Progress Notes (Signed)
FMTS Attending Daily Note:  Jeff Sieara Bremer MD  [phone] pager  Family Practice pager:  [phone] I have discussed this patient with the resident Dr. Sonnenberg.  I agree with their findings, assessment, and care plan  

## 2013-03-10 NOTE — H&P (View-Only) (Signed)
I agree with the above.  I have seen and examined the patient.  She is right handed with palpable radial and brachial pulses.  Vein mapping shows adequate cephaliv vein from the AC and higher.  I have discussed proceeding with a right upperarm fistula on Thursday.  Risks and benefits ere discussed including steal syndrome, need for future interventions and long term patency.   Wells Brabham 

## 2013-03-10 NOTE — Anesthesia Preprocedure Evaluation (Signed)
Anesthesia Evaluation  Patient identified by MRN, date of birth, ID band Patient awake    Reviewed: Allergy & Precautions, H&P , NPO status , Patient's Chart, lab work & pertinent test results  Airway       Dental   Pulmonary Current Smoker,          Cardiovascular hypertension,     Neuro/Psych    GI/Hepatic   Endo/Other    Renal/GU CRF, Dialysis and ESRFRenal disease     Musculoskeletal   Abdominal   Peds  Hematology   Anesthesia Other Findings   Reproductive/Obstetrics                           Anesthesia Physical Anesthesia Plan  ASA: III  Anesthesia Plan: General and MAC   Post-op Pain Management:    Induction: Intravenous  Airway Management Planned: Mask and Simple Face Mask  Additional Equipment:   Intra-op Plan:   Post-operative Plan:   Informed Consent: I have reviewed the patients History and Physical, chart, labs and discussed the procedure including the risks, benefits and alternatives for the proposed anesthesia with the patient or authorized representative who has indicated his/her understanding and acceptance.     Plan Discussed with:   Anesthesia Plan Comments:         Anesthesia Quick Evaluation

## 2013-03-11 ENCOUNTER — Telehealth: Payer: Self-pay | Admitting: Vascular Surgery

## 2013-03-11 ENCOUNTER — Encounter (HOSPITAL_COMMUNITY): Payer: Self-pay | Admitting: Vascular Surgery

## 2013-03-11 DIAGNOSIS — N19 Unspecified kidney failure: Secondary | ICD-10-CM

## 2013-03-11 DIAGNOSIS — I10 Essential (primary) hypertension: Secondary | ICD-10-CM

## 2013-03-11 DIAGNOSIS — F259 Schizoaffective disorder, unspecified: Secondary | ICD-10-CM

## 2013-03-11 DIAGNOSIS — F309 Manic episode, unspecified: Secondary | ICD-10-CM

## 2013-03-11 DIAGNOSIS — N058 Unspecified nephritic syndrome with other morphologic changes: Secondary | ICD-10-CM

## 2013-03-11 DIAGNOSIS — M329 Systemic lupus erythematosus, unspecified: Secondary | ICD-10-CM

## 2013-03-11 DIAGNOSIS — IMO0002 Reserved for concepts with insufficient information to code with codable children: Secondary | ICD-10-CM

## 2013-03-11 DIAGNOSIS — D649 Anemia, unspecified: Secondary | ICD-10-CM

## 2013-03-11 DIAGNOSIS — N185 Chronic kidney disease, stage 5: Secondary | ICD-10-CM

## 2013-03-11 DIAGNOSIS — Z9119 Patient's noncompliance with other medical treatment and regimen: Secondary | ICD-10-CM

## 2013-03-11 DIAGNOSIS — R809 Proteinuria, unspecified: Secondary | ICD-10-CM

## 2013-03-11 DIAGNOSIS — R609 Edema, unspecified: Secondary | ICD-10-CM

## 2013-03-11 LAB — RENAL FUNCTION PANEL
Albumin: 3.3 g/dL — ABNORMAL LOW (ref 3.5–5.2)
BUN: 33 mg/dL — ABNORMAL HIGH (ref 6–23)
CO2: 24 mEq/L (ref 19–32)
Calcium: 10 mg/dL (ref 8.4–10.5)
Chloride: 99 mEq/L (ref 96–112)
Creatinine, Ser: 11.17 mg/dL — ABNORMAL HIGH (ref 0.50–1.10)
GFR calc non Af Amer: 4 mL/min — ABNORMAL LOW (ref >90)
Glucose, Bld: 103 mg/dL — ABNORMAL HIGH (ref 70–99)

## 2013-03-11 LAB — CBC
HCT: 21.6 % — ABNORMAL LOW (ref 36.0–46.0)
Hemoglobin: 7.2 g/dL — ABNORMAL LOW (ref 12.0–15.0)
MCH: 27.7 pg (ref 26.0–34.0)
MCHC: 33.3 g/dL (ref 30.0–36.0)
MCV: 83.1 fL (ref 78.0–100.0)
Platelets: 172 10*3/uL (ref 150–400)
RBC: 2.6 MIL/uL — ABNORMAL LOW (ref 3.87–5.11)

## 2013-03-11 MED ORDER — HEPARIN SODIUM (PORCINE) 1000 UNIT/ML IJ SOLN
1300.0000 [IU] | Freq: Once | INTRAMUSCULAR | Status: AC
  Administered 2013-03-11: 1300 [IU] via INTRAVENOUS

## 2013-03-11 NOTE — Progress Notes (Signed)
Subjective:  Has had HD X 2 Had right brachiocephalic AVF by Dr. Edilia Bo 12/4 Due for TMT #3 today  Seen by psyche Does not qualify for psyche hosp They rec haldol for per paranoia and mania She refuses to take Haldol because she "does not think she needs it" although she would agree to outpt psyche counseling, and has done everything we have asked in terms of dialysis, fistula, etc  My hands are tied in terms of getting an outpt HD spot for her if she refuses meds that psyche recommends - I have tried calling the clinic manager at outpt dialysis today to find out if agreement of patient for outpt psyche services would satisfy their requirements for outpt placement but they are unavailable today for discussion  Objective Vital signs in last 24 hours: Filed Vitals:   03/10/13 1731 03/10/13 2221 03/11/13 0500 03/11/13 1000  BP: 110/80 120/86 109/60 111/79  Pulse: 78 88 75 78  Temp: 98.2 F (36.8 C) 97.9 F (36.6 C) 98.6 F (37 C) 98.2 F (36.8 C)  TempSrc: Oral Oral Oral Oral  Resp: 18 17 17 18   Height:      Weight:  66.134 kg (145 lb 12.8 oz)    SpO2: 96% 99% 97% 98%   Weight change: -0.266 kg (-9.4 oz)  Intake/Output Summary (Last 24 hours) at 03/11/13 1329 Last data filed at 03/11/13 0900  Gross per 24 hour  Intake    600 ml  Output      0 ml  Net    600 ml   Physical Exam:  BP 111/79  Pulse 78  Temp(Src) 98.2 F (36.8 C) (Oral)  Resp 18  Ht 5\' 9"  (1.753 m)  Wt 66.134 kg (145 lb 12.8 oz)  BMI 21.52 kg/m2  SpO2 98% Tall slender AAF TDC Right IJ site clean and dry Right upper arm AVF incisions dry with good bruit and thrill Clear lungs No rub No edema of the LE's  Labs: Basic Metabolic Panel:  Recent Labs Lab 03/07/13 1703 03/07/13 2000 03/08/13 0105 03/08/13 0415 03/08/13 1633 03/08/13 1634 03/09/13 0542 03/10/13 0500  NA 137  --  139 136  --  138 141 138  K 5.3* 5.3* 5.3* 5.0  --  5.2* 4.1 3.7  CL 103  --  104 101  --  103 104 100  CO2 16*  --   17* 15*  --  15* 20 24  GLUCOSE 77  --  72 90  --  101* 72 75  BUN 85*  --  84* 83*  --  83* 59* 26*  CREATININE 17.87*  --  17.60* 17.81*  --  17.80* 13.90* 8.82*  CALCIUM 10.7*  --  9.9 9.9 9.6 9.6 9.6 9.8  PHOS  --   --   --  9.1*  --  8.7* 8.3*  --     Recent Labs Lab 03/07/13 1703 03/08/13 0415 03/08/13 1634 03/09/13 0542  AST 10  --   --   --   ALT 7  --   --   --   ALKPHOS 43  --   --   --   BILITOT 0.3  --   --   --   PROT 7.9  --   --   --   ALBUMIN 4.2 3.5 3.6 3.6   PTH    Component Value Date/Time   PTH 201.0* 03/08/2013 1633    Recent Labs Lab 03/08/13 0415  03/08/13 1219 03/08/13 1633  03/09/13 0542 03/10/13 0500  WBC 5.5  --   --  4.9 4.2 3.3*  HGB 6.1*  < > 6.5* 5.9* 7.0* 7.2*  HCT 18.5*  < > 20.1* 17.5* 20.7* 21.4*  MCV 80.8  --   --  81.8 79.9 80.5  PLT 234  --   --  214 208 191  < > = values in this interval not displayed.Iron Studies:   Recent Labs Lab 03/08/13 1633  IRON 77  TIBC 225*  FERRITIN 674*   Studies/Results: No results found. Medications:   . amLODipine  5 mg Oral Daily  . calcium acetate  1,334 mg Oral TID WC  . darbepoetin (ARANESP) injection - DIALYSIS  100 mcg Intravenous Q Tue-HD  . diphenhydrAMINE  50 mg Oral QHS   Impression/Plan   1. 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  S/P TDC by IR on 12/2. S/P  AVF 12/4. CLIP process has been put on hold because of psyche issues. For HD today 2. Anemia - Transfused 1 unit on HD 12/2; started Darbepoetin 100/week yesterday; iron stores replete 3. CKD-MBD - Binders started; PTH 201; in desired range without VDRA at present (goal 150-300) 4. HTN - meds 5. Psyche issues (schizoaffective disorder) - per primary and psychiatry; currently IVC status; psyche has recommended Haldol for her paranoia and mania which she declines to take, but DOES agree to outpt psyche followup at [facility], MD [facility] [phone] pager 03/11/2013, 1:29 PM

## 2013-03-11 NOTE — Procedures (Signed)
I have personally attended this patient's dialysis session.   UF goal 1 liter TDC at 300 2K bath Tight heparin For 3/1/2 hour treatment today    Sara [name] B

## 2013-03-11 NOTE — Telephone Encounter (Addendum)
Message copied by Fredrich Birks on Fri Mar 11, 2013 11:57 AM ------      Message from: Phillips Odor      Created: Thu Mar 10, 2013  3:40 PM      Regarding: FW: charge and f/u       This may be a duplicate message.      ----- Message -----         From: Chuck Hint, MD         Sent: 03/10/2013  12:30 PM           To: Reuel Derby, Melene Plan, RN, #      Subject: charge and f/u                                           This patient had a right brachiocephalic AV fistula. Asst. Della Goo. She will need a follow visit in 6 weeks with the duplex to check on the maturation of her fistula. Thank you. CD       ------  03/11/13: spoke with pt to schedule, also mailed map to home address, dpm

## 2013-03-11 NOTE — Progress Notes (Signed)
Family Medicine Teaching Service Daily Progress Note Intern Pager: [phone]  Patient name: Sara [name] Medical record number: [id number] Date of birth: January 24, 1981 Age: 32 y.o. Gender: female  Primary Care Provider: Marikay Alar, MD Consultants: Psychiatry Code Status: Full  Pt Overview and Major Events to Date:  03/08/13 - IVC pending further psych eval / Hgb 6.1-->6.6-->6.5 / Cr 17.81 / Phos 9.1 03/09/13 - vein mapping and HD (12/2) started 03/10/13 - surgery access obtained  Assessment and Plan: Sara [name] is a 32 y.o. female presenting with IVC found to have worsened renal function . PMH is significant for lupus, CKD stage 5, HTN, schizophrenia.  # CKD stage 5, secondary to progressive SLE nephritis: patient with lupus nephritis and history of non-compliance presenting with elevation of Cr to 17.87. She is followed by Dr Briant Cedar of [facility]. She is supposed to be on cellcept, calcitriol, sodium bicarb, and lasix at home. Uncertain how compliant she has been with these medications. S/p 1x dose cellcept (DC'd). Hold Calcitriol (Ca 10.7) - c/s Nephrology ([facility])   - HD x2days   - HD access obtained 12/4  -IR placed temp access 12/2  -PTH 201 (high), initiate phosphate binders TID  -monitor renal panel daily    -pt must be psychiatrically stable before outpt HD can be set-up  # hyperkalemia: resolved with HD  # anemia: patient with hgb 6.3. Baseline is between 6-7. Is currently asymptomatic. Non-microcytic and not iron deficient, though with low TIBC and elevated ferritin may be related to anemia of chronic disease and related to her kidney disease. - will continue to trend hgb, improving trend hgb 6.1-->6.6-->6.5-->7.0-->7.2 s/p transfusion 1 u with dialysis - start Aranesp weekly - iron panel revealed iron 77, TIBC 225, ferritin 674 indicating probable anemia of chronic disease likely 2/2 renal failure  # schizophrenia: patient to  be IVC'd and awaiting medical clearance.  Pt difficult historian.  She endorses risperdal shots q2weeks, although has not gotten them in several weeks. - c/s Psychiatry for evaluation, pending further updates on status-will await their recs for psych treatment once medically stable - UDS, ETOH, salicylate level, and tylenol level normal - Psychiatry recommends restarting Haldol, pt is not amenable to this and does not believe she needs medication - pt does not meet qualifications for psych hospitalization   FEN/GI: renal diet, SLIV  Prophylaxis: scd's, no heparin given low hgb.  Disposition: unsafe to set-up outpt HD until she is stable, pt currently stable but refusing psych meds, also CSW consult b/c pt stated she is homeless  Subjective:  Pt doing well this morning, no complaints of pain.  She is happy with the way the surgery went yesterday.  She is not amenable to taking psych meds as recommended by psych, she believes she can handle her psych illness herself.  Objective: Temp:  [97.3 F (36.3 C)-98.6 F (37 C)] 98.6 F (37 C) (12/05 0500) Pulse Rate:  [62-88] 75 (12/05 0500) Resp:  [13-18] 17 (12/05 0500) BP: (104-120)/(60-86) 109/60 mmHg (12/05 0500) SpO2:  [95 %-100 %] 97 % (12/05 0500) Weight:  [145 lb 12.8 oz (66.134 kg)] 145 lb 12.8 oz (66.134 kg) (12/04 2221) Physical Exam: General: NAD, sitting in bed Cardiovascular: RRR, no murmurs heard Respiratory: CTAB Extremities: non-tender, no edema Neuro: AAO x 3, grossly non-focal  Laboratory:  Recent Labs Lab 03/08/13 1633 03/09/13 0542 03/10/13 0500  WBC 4.9 4.2 3.3*  HGB 5.9* 7.0* 7.2*  HCT 17.5* 20.7* 21.4*  PLT  214 208 191    Recent Labs Lab 03/07/13 1703  03/08/13 1634 03/09/13 0542 03/10/13 0500  NA 137  < > 138 141 138  K 5.3*  < > 5.2* 4.1 3.7  CL 103  < > 103 104 100  CO2 16*  < > 15* 20 24  BUN 85*  < > 83* 59* 26*  CREATININE 17.87*  < > 17.80* 13.90* 8.82*  CALCIUM 10.7*  < > 9.6 9.6 9.8   PROT 7.9  --   --   --   --   BILITOT 0.3  --   --   --   --   ALKPHOS 43  --   --   --   --   ALT 7  --   --   --   --   AST 10  --   --   --   --   GLUCOSE 77  < > 101* 72 75  < > = values in this interval not displayed. Phos 9.1  Imaging/Diagnostic Tests:  None  Rayburn Ma, Med Student 03/11/2013, 8:02 AM  Upper Level Addendum:  I have seen and evaluated this patient along with the MS4 and reviewed and agree with the above note.   General: NAD, sitting in bed  Cardiovascular: RRR, no murmurs heard  Respiratory: CTAB  Ext: no edema, fistula with good bruit and thrill Neuro: no focal deficits  Psych: continues to be less pressured, less tangential   Assessment and Plan:  Sara [name] is a 32 y.o. female presenting with IVC found to have worsened renal function . PMH is significant for lupus, CKD stage 5, HTN, schizophrenia.   # CKD stage 5, secondary to progressive SLE nephritis: patient with lupus nephritis and history of non-compliance presenting with elevation of Cr to 17.87. She is followed by Dr Briant Cedar of [facility].  -c/s Nephrology [facility]) appreciate their assistance  -s/p HD #2, to be set up for outpatient HD - awaiting placement given psych issues, needs to be stable prior to being accepted -IR placed temp access, s/p perm access placement yesterday  -PTH elevated, will likely go back on calcitriol, initiate phosphate binders TID  -monitor renal panel daily   # anemia: patient with hgb 6.3 on admission. Baseline is between 6-7. Is currently asymptomatic. Non-microcytic and not iron deficient, though with low TIBC and elevated ferritin may be related to anemia of chronic disease and related to her kidney disease.  - will continue to trend hgb, improving trend hgb 6.1-->6.6-->6.5-->7.0 s/p transfusion 1 u with dialysis-->7.2 - will f/u Hgb today - start Aranesp weekly  - iron panel revealed iron 77, TIBC 225, ferritin 674    # schizophrenia:   - c/s Psychiatry for evaluation - appreciate their rec's, will touch base with them today regarding treatment regimen given that outpatient dialysis has refused patient given past history - UDS, ETOH, salicylate level, and tylenol level normal  - patient refusing treatment of this at this time   FEN/GI: renal diet, SLIV  Prophylaxis: scd's, no heparin given low hgb.   Disposition: discharge outpatient dialysis placement  Marikay Alar, MD  Family Medicine PGY-2

## 2013-03-11 NOTE — Progress Notes (Signed)
   VASCULAR PROGRESS NOTE  SUBJECTIVE: No complaints  PHYSICAL EXAM: Filed Vitals:   03/10/13 1321 03/10/13 1731 03/10/13 2221 03/11/13 0500  BP: 104/78 110/80 120/86 109/60  Pulse: 80 78 88 75  Temp: 97.6 F (36.4 C) 98.2 F (36.8 C) 97.9 F (36.6 C) 98.6 F (37 C)  TempSrc: Oral Oral Oral Oral  Resp: 16 18 17 17   Height:      Weight:   145 lb 12.8 oz (66.134 kg)   SpO2: 95% 96% 99% 97%   Incision looks fine. Good thrill in right BC AVF Palpable right radial pulse.  LABS: Lab Results  Component Value Date   WBC 3.3* 03/10/2013   HGB 7.2* 03/10/2013   HCT 21.4* 03/10/2013   MCV 80.5 03/10/2013   PLT 191 03/10/2013   Lab Results  Component Value Date   CREATININE 8.82* 03/10/2013   Lab Results  Component Value Date   INR 1.11 08/11/2012   Principal Problem:   CKD (chronic kidney disease) stage 5, GFR less than 15 ml/min Active Problems:   Psychosis   Lupus nephritis   Renal failure  ASSESSMENT AND PLAN:  1. 1 Day Post-Op s/p: R BC AVF 2. Doing well. Will be available as needed. I have arranged a F/U visit in 6 weeks.   Cari Caraway Beeper: [phone] 03/11/2013

## 2013-03-11 NOTE — Clinical Social Work Psych Assess (Signed)
Clinical Social Work Department CLINICAL SOCIAL WORK PSYCHIATRY SERVICE LINE ASSESSMENT 03/11/2013  Patient:  Sara [name]  Account:  [id number]  Admit Date:  03/07/2013  Clinical Social Worker:  Vonita Moss, Theresia Majors  Date/Time:  03/11/2013 01:52 PM Referred by:  Care Management  Date referred:  03/09/2013 Reason for Referral  Behavioral Health Issues  Crisis Intervention   Presenting Symptoms/Problems (In the person's/family's own words):   Psychiatry was consulted for mental health history and to assist with dispositioning   Abuse/Neglect/Trauma History (check all that apply)  Denies history   Abuse/Neglect/Trauma Comments:   none reported or noted in the chart   Psychiatric History (check all that apply)  Inpatient/hospitilization   Psychiatric medications:  none - pt refuses to participate in RX therapy   Current Mental Health Hospitalizations/Previous Mental Health History:   pt reports having MH inpatient and outpatient tx   Current provider:   [facility]   Place and Date:   ongoing   Current Medications:   Scheduled Meds:      . amLODipine  5 mg Oral Daily  . calcium acetate  1,334 mg Oral TID WC  . darbepoetin (ARANESP) injection - DIALYSIS  100 mcg Intravenous Q Tue-HD  . diphenhydrAMINE  50 mg Oral QHS        Continuous Infusions:      PRN Meds:.haloperidol, HYDROcodone-acetaminophen, oxyCODONE-acetaminophen       Previous Impatient Admission/Date/Reason:   current admission - lupus   Emotional Health / Current Symptoms    Suicide/Self Harm  None reported   Suicide attempt in the past:   pt reports no hx of SI/attempts - pt is not currently suicidal and can contract for safety   Other harmful behavior:   none reported or noted in chart other than pt being non-compliant with meds   Psychotic/Dissociative Symptoms  Paranoia   Other Psychotic/Dissociative Symptoms:   none reported or noted in the chart    Attention/Behavioral Symptoms   Impulsive   Other Attention / Behavioral Symptoms:   Pt often speaks loudly. Thoughts and conversations are tangential and difficult to be re-directed stated that she is not being heard    Cognitive Impairment  Orientation - Place  Orientation - Self  Orientation - Situation  Orientation - Time  Other - See comment  Poor Judgement  Poor/Impaired Decision-Making   Other Cognitive Impairment:   none reported or noted in the chart    Mood and Adjustment  Aggressive/frustrated  Guarded  Unstable/Inconsistent    Stress, Anxiety, Trauma, Any Recent Loss/Stressor  Avoidance   Anxiety (frequency):   no other stresses other than medical stressors were reported   Phobia (specify):   none reported or noted in the chart   Compulsive behavior (specify):   none reported or noted in the chart   Obsessive behavior (specify):   none reported or noted in the chart   Other:   none other than those listed above were reported.   Substance Abuse/Use  Current substance use   SBIRT completed (please refer for detailed history):  N  Self-reported substance use:   pt reports no SA though UDS was positive for [facility]   Urinary Drug Screen Completed:  Y Alcohol level:   bal <11    Environmental/Housing/Living Arrangement  With Family Member   Who is in the home:   pt lives with mother   Emergency contact:  Publishing copy - friend   Financial  Medicaid  Medicare   Patient's Strengths and Goals (patient's  own words):   Pt is strong willed and has family and friend support within the community.  Pt has been compliant with medical staff while in hospital.   Clinical Social Worker's Interpretive Summary:   Psych was consulted for MH history specifically: bipolar/schizophrenia dx as well as being IVC for agressive behaviors by mom prior to ED admission on 12/1.  Pt has hx with [facility] for dx of Lupas as well as non-compliance with psych meds.  Pt has been given resources for  [place] on previous admissions.    Pt is verbally aggressive towards medical staff and is often loud in her presentation.  Pt is often upset because she does not feel that medical staff or folks in general listen to her.  Pt reports that her medical MD states that her psych meds do not enteract well with her Lupas; therefore pt is non-compliant.    Pt denies SI and HI.  Pt denies AVHD.  Pt is able to contract for safety.  Pt is openly non-complaint with psych meds and reports will not take psych meds upon dc.    Pt was IVC by mother prior to admission for aggressive behaviors.  IVC paperwork will need to be rescinded prior to dc.  Please contact CSW to make proper arrangements.   Disposition:  Outpatient referral made/needed  Vickii Penna, LCSWA [phone]  Clinical Social Work
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
## 2013-03-11 NOTE — Progress Notes (Signed)
FMTS Attending Daily Note:  Renold Don MD  [phone] pager  Family Practice pager:  [phone] I have seen and examined this patient and have reviewed their chart. I have discussed this patient with the resident. I agree with the resident's findings, assessment and care plan.  Additionally:  Patient currently now agrees to antipsychotic medications.  If this continues, can obtain outpatient HD. Needs to be encouraged to continue with Haldol usage.   Tobey Grim, MD 03/11/2013 2:26 PM

## 2013-03-11 NOTE — Clinical Social Work Psych Note (Signed)
Psych CSW placed outpatient follow-up information on dc summary.  This information has been given to the pt with previous admissions.  Pt is aware of this resource.  Psych CSW remains available.  Vickii Penna, LCSWA [phone]  Clinical Social Work

## 2013-03-12 LAB — CBC
HCT: 25.5 % — ABNORMAL LOW (ref 36.0–46.0)
MCH: 26.8 pg (ref 26.0–34.0)
MCHC: 31.4 g/dL (ref 30.0–36.0)
MCV: 85.3 fL (ref 78.0–100.0)
RDW: 14.7 % (ref 11.5–15.5)
WBC: 3.8 10*3/uL — ABNORMAL LOW (ref 4.0–10.5)

## 2013-03-12 LAB — RENAL FUNCTION PANEL
Albumin: 3.6 g/dL (ref 3.5–5.2)
BUN: 15 mg/dL (ref 6–23)
CO2: 27 mEq/L (ref 19–32)
Calcium: 10.1 mg/dL (ref 8.4–10.5)
Chloride: 100 mEq/L (ref 96–112)
Creatinine, Ser: 6.89 mg/dL — ABNORMAL HIGH (ref 0.50–1.10)
Sodium: 138 mEq/L (ref 135–145)

## 2013-03-12 MED ORDER — HALOPERIDOL 5 MG PO TABS
5.0000 mg | ORAL_TABLET | Freq: Two times a day (BID) | ORAL | Status: DC
  Administered 2013-03-12 – 2013-03-14 (×5): 5 mg via ORAL
  Filled 2013-03-12 (×7): qty 1

## 2013-03-12 NOTE — Progress Notes (Addendum)
Events/discussions noted.  I can only reiterate that unless/until patient agrees to and follows through with taking psyche medications as they (psychiatry) have recommended that an outpatient dialysis chair will not be offered.    It appears that she has agreed to take Haldol so if she does in fact comply as inpatient and agrees to take the med as outpt, with planned outpt psyche f/u, then I can request on Monday that the CLIP process be resumed and speak with the Medical Director and the Clinic manager at the Center about placing her.  Last HD was 12/5 (HD#3) so next treatment will be on Monday 12/8 EDW will be 66 Has TDC (12/2) and right BC AVF (12/4) On Aranesp 100 and s/p 1 unit transfusion 12/2 On binders (phos now good at 3.6) PTH 201 - in range (had been on calcitrol 0.25) so will change to IV hectorol with HD starting Monday

## 2013-03-12 NOTE — Progress Notes (Signed)
Family Medicine Teaching Service Daily Progress Note Intern Pager: [phone]  Patient name: Sara [name] Medical record number: [id number] Date of birth: May 18, 1980 Age: 32 y.o. Gender: female  Primary Care Provider: Marikay Alar, MD Consultants: Psychiatry Code Status: Full  Pt Overview and Major Events to Date:  03/08/13 - IVC pending further psych eval / Hgb 6.1-->6.6-->6.5 / Cr 17.81 / Phos 9.1 03/09/13 - vein mapping and HD (12/2) started 03/10/13 - surgery access obtained  Assessment and Plan:  Sara [name] is a 32 y.o. female presenting with IVC found to have worsened renal function . PMH is significant for lupus, CKD stage 5, HTN, schizophrenia.   # CKD stage 5, secondary to progressive SLE nephritis: patient with lupus nephritis and history of non-compliance presenting with elevation of Cr to 17.87. She is followed by Dr Briant Cedar of [facility].  -c/s Nephrology [facility]) appreciate their assistance  -s/p HD #3, to be set up for outpatient HD - awaiting placement given psych issues, needs to be stable prior to being accepted, apparently may have placement per patient - will touch base with dialysis coordinator today to see if she has placement -IR placed temp access, s/p perm access placement yesterday  -PTH elevated, will likely go back on calcitriol but will defer to renal, initiate phosphate binders TID  -monitor renal panel daily   # anemia: patient with hgb 6.3 on admission. Baseline is between 6-7. Is currently asymptomatic. Non-microcytic and not iron deficient, though with low TIBC and elevated ferritin may be related to anemia of chronic disease and related to her kidney disease.  - will continue to trend hgb, improving trend hgb 6.1-->6.6-->6.5-->7.0 s/p transfusion 1 u with dialysis-->7.2 -->7.2 --> 8 - start Aranesp weekly  - iron panel revealed iron 77, TIBC 225, ferritin 674   # schizophrenia:   - c/s Psychiatry for  evaluation - appreciate their rec's, patient to follow-up with outpatient psych at [facility], continues to refuse medications - UDS, ETOH, salicylate level, and tylenol level normal  - patient refusing treatment of this at this time   FEN/GI: renal diet, SLIV  Prophylaxis: scd's, heparin with dialysis.   Disposition: discharge pending outpatient dialysis placement  Subjective: states doing well. States may have outpatient dialysis placement set up, but is waiting to hear back from the dialysis coordinator. States will not take medications for her psychiatric illness though will meet with outpatient psych.   Objective: Temp:  [97.7 F (36.5 C)-98.5 F (36.9 C)] 98.5 F (36.9 C) (12/06 0908) Pulse Rate:  [58-97] 67 (12/06 0908) Resp:  [16-18] 18 (12/06 0908) BP: (102-130)/(54-86) 110/67 mmHg (12/06 0908) SpO2:  [86 %-100 %] 100 % (12/06 0908) Weight:  [144 lb 10 oz (65.6 kg)-147 lb 4.3 oz (66.8 kg)] 144 lb 12.8 oz (65.681 kg) (12/05 2212) Physical Exam: General: NAD, sitting in chair Cardiovascular: RRR, no murmurs heard Respiratory: CTAB Extremities: non-tender, no edema, fistula with good bruit and thrill Neuro: no focal deficits  Laboratory:  Recent Labs Lab 03/10/13 0500 03/11/13 0830 03/12/13 1035  WBC 3.3* 4.9 3.8*  HGB 7.2* 7.2* 8.0*  HCT 21.4* 21.6* 25.5*  PLT 191 172 186    Recent Labs Lab 03/07/13 1703  03/10/13 0500 03/11/13 0830 03/12/13 1035  NA 137  < > 138 136 138  K 5.3*  < > 3.7 3.7 4.3  CL 103  < > 100 99 100  CO2 16*  < > 24 24 27   BUN 85*  < >  26* 33* 15  CREATININE 17.87*  < > 8.82* 11.17* 6.89*  CALCIUM 10.7*  < > 9.8 10.0 10.1  PROT 7.9  --   --   --   --   BILITOT 0.3  --   --   --   --   ALKPHOS 43  --   --   --   --   ALT 7  --   --   --   --   AST 10  --   --   --   --   GLUCOSE 77  < > 75 103* 82  < > = values in this interval not displayed. Phos 9.1  Imaging/Diagnostic Tests:  None  Glori Luis, MD 03/12/2013, 11:40  AM
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
## 2013-03-12 NOTE — Progress Notes (Addendum)
FMTS Attending Daily Note:  Renold Don MD  [phone] pager  Family Practice pager:  [phone] I have seen and examined this patient and have reviewed their chart. I have discussed this patient with the resident. I agree with the resident's findings, assessment and care plan.  ** Late note -- seen prior to the events documented by Dr. Birdie Sons.  Patient with more pressured speech today, approaching mania as witnessed previously this admission.  Continuing to encourage anti-psychotic usage as recommended by psych.   - Greatly appreciate Renal's help with all of this, as well as Psychiatry.   Tobey Grim, MD 03/12/2013 2:50 PM

## 2013-03-13 ENCOUNTER — Encounter (HOSPITAL_COMMUNITY): Payer: Self-pay | Admitting: *Deleted

## 2013-03-13 DIAGNOSIS — N186 End stage renal disease: Secondary | ICD-10-CM

## 2013-03-13 DIAGNOSIS — Z992 Dependence on renal dialysis: Secondary | ICD-10-CM

## 2013-03-13 LAB — CBC
Hemoglobin: 7.7 g/dL — ABNORMAL LOW (ref 12.0–15.0)
MCH: 27.6 pg (ref 26.0–34.0)
MCHC: 32 g/dL (ref 30.0–36.0)
MCV: 86.4 fL (ref 78.0–100.0)
RBC: 2.79 MIL/uL — ABNORMAL LOW (ref 3.87–5.11)

## 2013-03-13 LAB — RENAL FUNCTION PANEL
CO2: 27 mEq/L (ref 19–32)
Calcium: 10.2 mg/dL (ref 8.4–10.5)
Chloride: 97 mEq/L (ref 96–112)
Creatinine, Ser: 8.75 mg/dL — ABNORMAL HIGH (ref 0.50–1.10)
GFR calc Af Amer: 6 mL/min — ABNORMAL LOW (ref >90)
Glucose, Bld: 83 mg/dL (ref 70–99)
Sodium: 135 mEq/L (ref 135–145)

## 2013-03-13 MED ORDER — AMLODIPINE BESYLATE 5 MG PO TABS
5.0000 mg | ORAL_TABLET | Freq: Every day | ORAL | Status: DC
  Filled 2013-03-13: qty 1

## 2013-03-13 MED ORDER — DOXERCALCIFEROL 4 MCG/2ML IV SOLN
1.0000 ug | INTRAVENOUS | Status: DC
  Filled 2013-03-13: qty 2

## 2013-03-13 MED ORDER — RENA-VITE PO TABS
1.0000 | ORAL_TABLET | Freq: Every day | ORAL | Status: DC
  Administered 2013-03-13: 1 via ORAL
  Filled 2013-03-13 (×2): qty 1

## 2013-03-13 NOTE — Progress Notes (Signed)
Per pt, requested for HS medications to be given at 2030.

## 2013-03-13 NOTE — Progress Notes (Signed)
Subjective:  Has had HD X 3 Had right brachiocephalic AVF by Dr. 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 Today very pleasant, no pressured speech, quiet - we had good conversation Says no CP, SOB Appetite good In good spirits  Objective Vital signs in last 24 hours: Filed Vitals:   03/12/13 1725 03/12/13 2145 03/13/13 0653 03/13/13 0959  BP: 113/64 103/58 104/66 110/68  Pulse: 70 63 64 65  Temp: 98.5 F (36.9 C) 98.4 F (36.9 C) 97.9 F (36.6 C) 98 F (36.7 C)  TempSrc: Oral Oral Oral Oral  Resp: 18 18 18 18   Height:      Weight:  65.681 kg (144 lb 12.8 oz)    SpO2: 100% 100% 99% 98%   Weight change: -1.119 kg (-2 lb 7.5 oz)  Intake/Output Summary (Last 24 hours) at 03/13/13 1138 Last data filed at 03/13/13 0900  Gross per 24 hour  Intake   1200 ml  Output      0 ml  Net   1200 ml   Physical Exam:  BP 110/68  Pulse 65  Temp(Src) 98 F (36.7 C) (Oral)  Resp 18  Ht 5\' 9"  (1.753 m)  Wt 65.681 kg (144 lb 12.8 oz)  BMI 21.37 kg/m2  SpO2 98% Tall slender AAF Very pleasant TDC Right IJ site clean and dry Right upper arm AVF incisions dry with good bruit and thrill Clear lungs No rub No edema of the LE's  Basic Metabolic Panel:  Recent Labs Lab 03/08/13 0415 03/08/13 1633 03/08/13 1634 03/09/13 0542 03/10/13 0500 03/11/13 0830 03/12/13 1035 03/13/13 1025  NA 136  --  138 141 138 136 138 135  K 5.0  --  5.2* 4.1 3.7 3.7 4.3 4.1  CL 101  --  103 104 100 99 100 97  CO2 15*  --  15* 20 24 24 27 27   GLUCOSE 90  --  101* 72 75 103* 82 83  BUN 83*  --  83* 59* 26* 33* 15 20  CREATININE 17.81*  --  17.80* 13.90* 8.82* 11.17* 6.89* 8.75*  CALCIUM 9.9 9.6 9.6 9.6 9.8 10.0 10.1 10.2  PHOS 9.1*  --  8.7* 8.3*  --  5.4* 3.6 3.5    Recent Labs Lab 03/07/13 1703  03/11/13 0830 03/12/13 1035 03/13/13 1025  AST 10  --   --   --   --   ALT 7  --   --   --   --   ALKPHOS 43  --   --    --   --   BILITOT 0.3  --   --   --   --   PROT 7.9  --   --   --   --   ALBUMIN 4.2  < > 3.3* 3.6 3.3*  < > = values in this interval not displayed. PTH    Component Value Date/Time   PTH 201.0* 03/08/2013 1633    Recent Labs Lab 03/10/13 0500 03/11/13 0830 03/12/13 1035 03/13/13 1025  WBC 3.3* 4.9 3.8* 3.7*  HGB 7.2* 7.2* 8.0* 7.7*  HCT 21.4* 21.6* 25.5* 24.1*  MCV 80.5 83.1 85.3 86.4  PLT 191 172 186 185   Iron Studies:   Recent Labs Lab 03/08/13 1633  IRON 77  TIBC 225*  FERRITIN 674*    Medications:   . amLODipine  5 mg Oral Daily  .  calcium acetate  1,334 mg Oral TID WC  . darbepoetin (ARANESP) injection - DIALYSIS  100 mcg Intravenous Q Tue-HD  . diphenhydrAMINE  50 mg Oral QHS  . haloperidol  5 mg Oral BID   Impression/Plan   1. 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  S/P TDC by IR on 12/2. S/P  AVF 12/4. CLIP process had been put on hold because of psyche issues but she is now taking haldol, behavior today no issues.  Next HD tomorrow.  Will ask that CLIP process be resumed now that she is on meds and has agreed to outpt psyche followup. Next HD tomorrow. 2. Anemia - Transfused 1 unit on HD 12/2; started Darbepoetin 100/week yesterday; iron stores replete 3. CKD-MBD - Binders started; PTH 201 - in range (had been on calcitrol 0.25) so will change to IV hectorol with HD starting Monday 4. HTN - meds; change amlodipine to bedtime so as not to interfere with HD 5. Psyche issues (schizoaffective disorder) - now taking haldol BID and agreeable to outpt psyche followup at [facility].  Camille Bal, MD [facility] [phone] pager 03/13/2013, 11:38 AM

## 2013-03-13 NOTE — Progress Notes (Signed)
Family Medicine Teaching Service Daily Progress Note Intern Pager: [phone]  Patient name: Sara [name] Medical record number: [id number] Date of birth: [birth year] Age: 32 y.o. Gender: female  Primary Care Provider: Marikay Alar, MD Consultants: Psychiatry Code Status: Full  Pt Overview and Major Events to Date:  03/08/13 - IVC pending further psych eval / Hgb 6.1-->6.6-->6.5 / Cr 17.81 / Phos 9.1 03/09/13 - vein mapping and HD (12/2) started 03/10/13 - surgery access obtained  Assessment and Plan:  Sara [name] is a 32 y.o. female presenting with IVC found to have worsened renal function . PMH is significant for lupus, CKD stage 5, HTN, schizophrenia.   # CKD stage 5, secondary to progressive SLE nephritis: patient with lupus nephritis and history of non-compliance presenting with elevation of Cr to 17.87. She is followed by Dr Briant Cedar of [facility].  -c/s Nephrology [facility]) appreciate their assistance  -s/p HD #3, to be set up for outpatient HD - awaiting placement given psych issues, potentially will be able to be accepted Monday given agreeability to taking haldol BID  -IR placed temp access, s/p perm access placement yesterday  -PTH elevated, hectorol with dialysis, phosphate binders TID  -monitor renal panel daily   # anemia: patient with hgb 6.3 on admission. Baseline is between 6-7. Is currently asymptomatic. Non-microcytic and not iron deficient, though with low TIBC and elevated ferritin may be related to anemia of chronic disease and related to her kidney disease.  - will continue to trend hgb, improving trend hgb 6.1-->6.6-->6.5-->7.0 s/p transfusion 1 u with dialysis-->7.2 -->7.2 --> 8 --> 7.7 - start Aranesp weekly  - iron panel revealed iron 77, TIBC 225, ferritin 674   # schizophrenia:   - c/s Psychiatry for evaluation - appreciate their rec's, patient to follow-up with outpatient psych at [facility], has started taking haldol  5 mg BID - UDS, ETOH, salicylate level, and tylenol level normal  - patient refusing treatment of this at this time   FEN/GI: renal diet, SLIV  Prophylaxis: scd's, heparin with dialysis.   Disposition: discharge pending outpatient dialysis placement  Subjective: Much improved this morning. States doing better. Only complaint is intermittent calf cramping yesterday.   Objective: Temp:  [97.9 F (36.6 C)-98.5 F (36.9 C)] 98 F (36.7 C) (12/07 0959) Pulse Rate:  [63-73] 65 (12/07 0959) Resp:  [18-19] 18 (12/07 0959) BP: (103-117)/(58-72) 110/68 mmHg (12/07 0959) SpO2:  [98 %-100 %] 98 % (12/07 0959) Weight:  [144 lb 12.8 oz (65.681 kg)] 144 lb 12.8 oz (65.681 kg) (12/06 2145) Physical Exam: General: NAD, sitting in bed Cardiovascular: RRR, no murmurs heard Respiratory: CTAB Extremities: non-tender, no edema, no cords, fistula with good bruit and thrill, no overlying erythema Neuro: no focal deficits  Laboratory:  Recent Labs Lab 03/11/13 0830 03/12/13 1035 03/13/13 1025  WBC 4.9 3.8* 3.7*  HGB 7.2* 8.0* 7.7*  HCT 21.6* 25.5* 24.1*  PLT 172 186 185    Recent Labs Lab 03/07/13 1703  03/11/13 0830 03/12/13 1035 03/13/13 1025  NA 137  < > 136 138 135  K 5.3*  < > 3.7 4.3 4.1  CL 103  < > 99 100 97  CO2 16*  < > 24 27 27   BUN 85*  < > 33* 15 20  CREATININE 17.87*  < > 11.17* 6.89* 8.75*  CALCIUM 10.7*  < > 10.0 10.1 10.2  PROT 7.9  --   --   --   --   BILITOT  0.3  --   --   --   --   ALKPHOS 43  --   --   --   --   ALT 7  --   --   --   --   AST 10  --   --   --   --   GLUCOSE 77  < > 103* 82 83  < > = values in this interval not displayed. Phos 9.1  Imaging/Diagnostic Tests:  None  Glori Luis, MD 03/13/2013, 11:16 AM

## 2013-03-13 NOTE — Progress Notes (Signed)
FMTS Attending Daily Note:  Renold Don MD  [phone] pager  Family Practice pager:  [phone] I have seen and examined this patient and have reviewed their chart. I have discussed this patient with the resident. I agree with the resident's findings, assessment and care plan.  Additionally:  Vastly improved today from psych standpoint.  Appropriate, conversant, no pressured/tangential speech.  Now on her Haldol Hope[facility] we can secure outpt HD now she is taking her medications.    Tobey Grim, MD 03/13/2013 12:24 PM

## 2013-03-14 LAB — CBC
HCT: 24.6 % — ABNORMAL LOW (ref 36.0–46.0)
Hemoglobin: 7.8 g/dL — ABNORMAL LOW (ref 12.0–15.0)
MCH: 27.2 pg (ref 26.0–34.0)
MCV: 85.7 fL (ref 78.0–100.0)
RBC: 2.87 MIL/uL — ABNORMAL LOW (ref 3.87–5.11)
WBC: 4.7 10*3/uL (ref 4.0–10.5)

## 2013-03-14 LAB — RENAL FUNCTION PANEL
Albumin: 3.5 g/dL (ref 3.5–5.2)
BUN: 29 mg/dL — ABNORMAL HIGH (ref 6–23)
CO2: 24 mEq/L (ref 19–32)
Chloride: 99 mEq/L (ref 96–112)
Creatinine, Ser: 10.37 mg/dL — ABNORMAL HIGH (ref 0.50–1.10)
GFR calc Af Amer: 5 mL/min — ABNORMAL LOW (ref >90)
GFR calc non Af Amer: 4 mL/min — ABNORMAL LOW (ref >90)
Glucose, Bld: 81 mg/dL (ref 70–99)
Potassium: 4.2 mEq/L (ref 3.5–5.1)

## 2013-03-14 MED ORDER — DOXERCALCIFEROL 4 MCG/2ML IV SOLN
INTRAVENOUS | Status: AC
  Administered 2013-03-14: 1 ug
  Filled 2013-03-14: qty 2

## 2013-03-14 MED ORDER — RENA-VITE PO TABS: 1.0000 | ORAL_TABLET | Freq: Every day | ORAL | Status: DC

## 2013-03-14 MED ORDER — HALOPERIDOL 5 MG PO TABS: 5.0000 mg | ORAL_TABLET | Freq: Two times a day (BID) | ORAL | Status: DC

## 2013-03-14 MED ORDER — CALCIUM ACETATE 667 MG PO CAPS: 1334.0000 mg | ORAL_CAPSULE | Freq: Three times a day (TID) | ORAL | Status: DC

## 2013-03-14 MED ORDER — HEPARIN SODIUM (PORCINE) 1000 UNIT/ML IJ SOLN
1300.0000 [IU] | Freq: Once | INTRAMUSCULAR | Status: AC
  Administered 2013-03-14: 1300 [IU] via INTRAVENOUS

## 2013-03-14 NOTE — Discharge Summary (Cosign Needed)
Family Medicine Teaching [facility] Discharge Summary  Patient name: Sara [name] Medical record number: [id number] Date of birth: [birth year] Age: 32 y.o. Gender: female Date of Admission: 03/07/2013  Date of Discharge: 03/14/2013 Admitting Physician: Tobey Grim, MD  Primary Care Provider: Marikay Alar, MD Consultants: Nephrology, Psychiatry  Indication for Hospitalization: newly diagnosed end stage renal disease requiring emergent dialysis  Discharge Diagnoses/Problem List:  Chronic Kidney Disease, stage 5 Anemia Schizophrenia  Disposition: discharge to home with outpt. Dialysis set up tues, thurs, sat 2nd shift  Discharge Condition: Stable  Brief Hospital Course: Pt originally admitted to the hospital as IVC and found to have Creatinine of 17.83 and GFR of 6.  Nephrology was consulted and pt. was started on hemodialysis with placement of temporary access.  She completed 4 treatments while in the hospital.  On 12/4 pt had vascular surgery for placement of AV fistula.  Her PTH was found to be elevated to 201 and her phosphate was elevated to 9.1, requiring phosphate binders TID.  Otherwise, she tolerated dialysis well.  Pt anemic at presentation with hemoglobin of 6.3.  Her baseline is generally between 6-7.  Iron panel was ordered and her anemia is most consistent with anemia of chronic disease with low TIBC and elevated ferritin.  Aranesp weekly was started.  Pt originally presented to the hospital as IVC.  She was floridly manic in the beginning of the hospitalization, requiring security to be called once.  Her mental status has improved during hospitalization and she no longer meets criteria for IVC.  She is agreeable to outpatient follow-up at [facility] and 5mg  Haldol BID.    Issues for Follow Up:  1. Follow-up with [facility] for outpatient treatment of mental health  2. Follow-up with vascular surgery for AV Fistula 3. Follow-up with outpatient dialysis [facility] Tuesday, Thursday, and Saturday at 11am (2nd shift). 4. Follow- up with nephrology for management of ESRD   Significant Procedures:  AV Fistula placement 03/10/2013  Significant Labs and Imaging:   Recent Labs Lab 03/12/13 1035 03/13/13 1025 03/14/13 0620  WBC 3.8* 3.7* 4.7  HGB 8.0* 7.7* 7.8*  HCT 25.5* 24.1* 24.6*  PLT 186 185 211    Recent Labs Lab 03/07/13 1703  03/09/13 0542 03/10/13 0500 03/11/13 0830 03/12/13 1035 03/13/13 1025 03/14/13 0620  NA 137  < > 141 138 136 138 135 135  K 5.3*  < > 4.1 3.7 3.7 4.3 4.1 4.2  CL 103  < > 104 100 99 100 97 99  CO2 16*  < > 20 24 24 27 27 24   GLUCOSE 77  < > 72 75 103* 82 83 81  BUN 85*  < > 59* 26* 33* 15 20 29*  CREATININE 17.87*  < > 13.90* 8.82* 11.17* 6.89* 8.75* 10.37*  CALCIUM 10.7*  < > 9.6 9.8 10.0 10.1 10.2 10.2  PHOS  --   < > 8.3*  --  5.4* 3.6 3.5 4.7*  ALKPHOS 43  --   --   --   --   --   --   --   AST 10  --   --   --   --   --   --   --   ALT 7  --   --   --   --   --   --   --   ALBUMIN 4.2  < > 3.6  --  3.3* 3.6 3.3* 3.5  < > =  values in this interval not displayed.   Discharge Medications:    Medication List    ASK your doctor about these medications       acetaminophen 500 MG tablet  Commonly known as:  TYLENOL  Take 500-1,000 mg by mouth 4 (four) times daily as needed (pain).     amLODipine 5 MG tablet  Commonly known as:  NORVASC  Take 5 mg by mouth daily.     aspirin EC 325 MG tablet  Take 325-650 mg by mouth 2 (two) times daily as needed (pain).     calcitRIOL 0.25 MCG capsule  Commonly known as:  ROCALTROL  Take 1 capsule (0.25 mcg total) by mouth daily.     furosemide 80 MG tablet  Commonly known as:  LASIX  Take 160 mg by mouth 2 (two) times daily.     ibuprofen 600 MG tablet  Commonly known as:  ADVIL,MOTRIN  Take 1 tablet (600 mg total) by mouth every 6 (six) hours as needed.     methocarbamol 500 MG tablet  Commonly known as:  ROBAXIN  Take 1 tablet  (500 mg total) by mouth 2 (two) times daily as needed for muscle spasms.     mycophenolate 500 MG tablet  Commonly known as:  CELLCEPT  Take 1,500 mg by mouth 2 (two) times daily.     sodium bicarbonate 650 MG tablet  Take 650 mg by mouth 2 (two) times daily.        Discharge Instructions: Please refer to Patient Instructions section of EMR for full details.  Patient was counseled important signs and symptoms that should prompt return to medical care, changes in medications, dietary instructions, activity restrictions, and follow up appointments.   Follow-Up Appointments:     Follow-up Information   Follow up with [facility]. (Walk-in only Monday-Friday 8am-3:30pm for follow up for psychiatric needs: counseling; medication management)    Contact information:   [address] [place] [address] [phone]      Rayburn Ma, Med Student 03/14/2013, 12:30 PM [facility]

## 2013-03-14 NOTE — Progress Notes (Signed)
03/14/2013 11:04 AM Hemodialysis Outpatient Note; this patient has been accepted at the [facility] dialysis center on a Tuesday Thursday and Saturday 2nd shift schedule.The center can begin treatment tomorrow Tuesday December 9,2014 at 11:00 AM.Thank you. Tilman Neat

## 2013-03-14 NOTE — Progress Notes (Signed)
Family Medicine Teaching Service Daily Progress Note Intern Pager: [phone]  Patient name: Sara [name] Medical record number: [id number] Date of birth: 10/06/1980 Age: 32 y.o. Gender: female  Primary Care Provider: Marikay Alar, MD Consultants: Psychiatry Code Status: Full  Pt Overview and Major Events to Date:  03/08/13 - IVC pending further psych eval / Hgb 6.1-->6.6-->6.5 / Cr 17.81 / Phos 9.1 03/09/13 - vein mapping and HD (12/2) started 03/10/13 - surgery access obtained  Assessment and Plan:  Sara [name] is a 32 y.o. female presenting with IVC found to have worsened renal function . PMH is significant for lupus, CKD stage 5, HTN, schizophrenia.   # CKD stage 5, secondary to progressive SLE nephritis: patient with lupus nephritis and history of non-compliance presenting with elevation of Cr to 17.87. She is followed by Dr Briant Cedar of [facility].  -c/s Nephrology [facility]) appreciate their assistance  -s/p HD #4, to be set up for outpatient HD - awaiting placement given psych issues, potentially will be able to be accepted Monday given agreeability to taking haldol BID  -IR placed temp access, s/p perm access placement yesterday  -PTH elevated, hectorol with dialysis, phosphate binders TID  -monitor renal panel daily    # anemia: patient with hgb 6.3 on admission. Baseline is between 6-7. Is currently asymptomatic. Non-microcytic and not iron deficient, though with low TIBC and elevated ferritin may be related to anemia of chronic disease and related to her kidney disease.  - will continue to trend hgb, improving trend hgb 6.1-->6.6-->6.5-->7.0 s/p transfusion 1 u with dialysis-->7.2 -->7.2 --> 8 --> 7.7 - start Aranesp weekly  - iron panel revealed iron 77, TIBC 225, ferritin 674   # schizophrenia:   - c/s Psychiatry for evaluation - appreciate their rec's, patient to follow-up with outpatient psych at [facility], has started taking  haldol 5 mg BID - UDS, ETOH, salicylate level, and tylenol level normal  - patient agreeable to taking 5mg  Haldol BID  FEN/GI: renal diet, SLIV  Prophylaxis: scd's, heparin with dialysis.   Disposition: discharge pending outpatient dialysis placement  Subjective: Much improved this morning. States doing better.  Continues to state she will be compliant with her psych medications.  Only complaint is intermittent calf cramping.     Objective: Temp:  [97.3 F (36.3 C)-98.2 F (36.8 C)] 98.2 F (36.8 C) (12/08 0718) Pulse Rate:  [65-87] 85 (12/08 0900) Resp:  [16-20] 18 (12/08 0900) BP: (110-132)/(60-84) 132/79 mmHg (12/08 0900) SpO2:  [98 %-100 %] 99 % (12/08 0718) Weight:  [144 lb 12.8 oz (65.681 kg)-145 lb 15.1 oz (66.2 kg)] 145 lb 15.1 oz (66.2 kg) (12/08 0718) Physical Exam: General: NAD, sitting in bed in dialysis Cardiovascular: RRR, no murmurs heard Respiratory: CTAB Extremities: non-tender, no edema, no cords, fistula with good bruit and thrill, no overlying erythema Neuro: no focal deficits  Laboratory:  Recent Labs Lab 03/12/13 1035 03/13/13 1025 03/14/13 0620  WBC 3.8* 3.7* 4.7  HGB 8.0* 7.7* 7.8*  HCT 25.5* 24.1* 24.6*  PLT 186 185 211    Recent Labs Lab 03/07/13 1703  03/12/13 1035 03/13/13 1025 03/14/13 0620  NA 137  < > 138 135 135  K 5.3*  < > 4.3 4.1 4.2  CL 103  < > 100 97 99  CO2 16*  < > 27 27 24   BUN 85*  < > 15 20 29*  CREATININE 17.87*  < > 6.89* 8.75* 10.37*  CALCIUM 10.7*  < >  10.1 10.2 10.2  PROT 7.9  --   --   --   --   BILITOT 0.3  --   --   --   --   ALKPHOS 43  --   --   --   --   ALT 7  --   --   --   --   AST 10  --   --   --   --   GLUCOSE 77  < > 82 83 81  < > = values in this interval not displayed. Phos 4.7  Imaging/Diagnostic Tests:  None  Rayburn Ma, Med Student 03/14/2013, 9:36 AM  Upper Level Addendum:  I have seen and evaluated this patient along with MS4 and reviewed the above note.  General: NAD,  sitting in bed in dialysis Cardiovascular: RRR, no murmurs heard Respiratory: CTAB anteriorly Neuro: no focal deficits  Sara [name] is a 32 y.o. female presenting with IVC found to have worsened renal function . PMH is significant for lupus, CKD stage 5, HTN, schizophrenia.   # CKD stage 5, secondary to progressive SLE nephritis: patient with lupus nephritis and history of non-compliance presenting with elevation of Cr to 17.87. She is followed by Dr Briant Cedar of [facility].  -c/s Nephrology [facility]) appreciate their assistance  -s/p HD #4, to be set up for outpatient HD - awaiting placement given psych issues, has been accepted to [facility] center -IR placed temp access, s/p perm access placement yesterday  -PTH elevated, hectorol with dialysis, phosphate binders TID  -monitor renal panel daily    # anemia: patient with hgb 6.3 on admission. Baseline is between 6-7. Is currently asymptomatic. Non-microcytic and not iron deficient, though with low TIBC and elevated ferritin may be related to anemia of chronic disease and related to her kidney disease.  - will continue to trend hgb, improving trend hgb 6.1-->6.6-->6.5-->7.0 s/p transfusion 1 u with dialysis-->7.2 -->7.2 --> 8 --> 7.7 - start Aranesp weekly  - iron panel revealed iron 77, TIBC 225, ferritin 674   # schizophrenia:   - c/s Psychiatry for evaluation - appreciate their rec's, patient to follow-up with outpatient psych at [facility], has started taking haldol 5 mg BID - UDS, ETOH, salicylate level, and tylenol level normal  - patient agreeable to taking 5mg  Haldol BID  FEN/GI: renal diet, SLIV  Prophylaxis: scd's, heparin with dialysis.   Disposition: discharge today as has outpatient dialysis placement  Marikay Alar, MD [facility] Medicine PGY-2

## 2013-03-14 NOTE — Progress Notes (Signed)
Discussed discharge instructions and medications with pt. Pt showed no barriers to discharge. IV removed. Pt discharged to home. Assessment unchanged from morning.

## 2013-03-14 NOTE — Procedures (Signed)
I was present at this dialysis session. I have reviewed the session itself and made appropriate changes.   Pt now with outpt chair first Tx tomorrow, 12/9 on 2nd shift.  She feels well today nearing end of Tx.  No issues.    Sabra Heck  MD 03/14/2013, 11:31 AM

## 2013-03-14 NOTE — Progress Notes (Signed)
I discussed with Dr Sonnenberg.  I agree with their plans documented in their progress note for today.  

## 2013-03-17 NOTE — Discharge Summary (Signed)
Family Medicine Teaching [facility] Discharge Summary  Patient name: Sara [name] Medical record number: [id number] Date of birth: Jul 29, 1980 Age: 32 y.o. Gender: female Date of Admission: 03/07/2013  Date of Discharge: 03/14/2013 Admitting Physician: Tobey Grim, MD  Primary Care Provider: Marikay Alar, MD Consultants: Nephrology, Psychiatry  Indication for Hospitalization: newly diagnosed end stage renal disease requiring emergent dialysis  Discharge Diagnoses/Problem List:  Chronic Kidney Disease, stage 5 Lupus Nephritis Hyperphosphatemia Anemia Schizophrenia  Disposition: discharge to home with outpt. Dialysis set up tues, thurs, sat 2nd shift  Discharge Condition: Stable  Brief Hospital Course: Pt originally admitted to the hospital as IVC and found to have Creatinine of 17.83 and GFR of 6.  Nephrology was consulted and pt. was started on hemodialysis with placement of temporary access.  She completed 4 treatments while in the hospital.  On 12/4 pt had vascular surgery for placement of AV fistula and tolerated this well.  Her PTH was found to be elevated to 201 and her phosphate was elevated to 9.1, requiring phosphate binders TID.  Otherwise, she tolerated dialysis well.  Pt anemic at presentation with hemoglobin of 6.3.  Her baseline is generally between 6-7.  Iron panel was ordered and her anemia is most consistent with anemia of chronic disease with low TIBC and elevated ferritin.  Aranesp weekly was started. She was transfused 1 u RBCs during dialysis. Her hgb was stable at 7.8 at discharge.  Pt originally presented to the hospital as IVC.  She was floridly manic in the beginning of the hospitalization, requiring security to be called once.  Her mental status has improved during hospitalization and she no longer meets criteria for IVC.  She is agreeable to outpatient follow-up at [facility] and 5mg  Haldol BID. She was evaluated by psychiatry and deemed to not  need inpatient psych hospitalization.  Issues for Follow Up:  1. Follow-up with [facility] for outpatient treatment of mental health  2. Follow-up with vascular surgery for AV Fistula 3. Follow-up with outpatient dialysis [facility] Tuesday, Thursday, and Saturday at 11am (2nd shift). 4. Follow- up with nephrology for management of ESRD   Significant Procedures:  IR Fluoro guided CV line 03/08/13 AV Fistula placement 03/10/2013  Significant Labs and Imaging:   Recent Labs Lab 03/12/13 1035 03/13/13 1025 03/14/13 0620  WBC 3.8* 3.7* 4.7  HGB 8.0* 7.7* 7.8*  HCT 25.5* 24.1* 24.6*  PLT 186 185 211    Recent Labs Lab 03/07/13 1703  03/09/13 0542 03/10/13 0500 03/11/13 0830 03/12/13 1035 03/13/13 1025 03/14/13 0620  NA 137  < > 141 138 136 138 135 135  K 5.3*  < > 4.1 3.7 3.7 4.3 4.1 4.2  CL 103  < > 104 100 99 100 97 99  CO2 16*  < > 20 24 24 27 27 24   GLUCOSE 77  < > 72 75 103* 82 83 81  BUN 85*  < > 59* 26* 33* 15 20 29*  CREATININE 17.87*  < > 13.90* 8.82* 11.17* 6.89* 8.75* 10.37*  CALCIUM 10.7*  < > 9.6 9.8 10.0 10.1 10.2 10.2  PHOS  --   < > 8.3*  --  5.4* 3.6 3.5 4.7*  ALKPHOS 43  --   --   --   --   --   --   --   AST 10  --   --   --   --   --   --   --  ALT 7  --   --   --   --   --   --   --   ALBUMIN 4.2  < > 3.6  --  3.3* 3.6 3.3* 3.5  < > = values in this interval not displayed.   Discharge Medications:    Medication List    STOP taking these medications       aspirin EC 325 MG tablet     calcitRIOL 0.25 MCG capsule  Commonly known as:  ROCALTROL     furosemide 80 MG tablet  Commonly known as:  LASIX     ibuprofen 600 MG tablet  Commonly known as:  ADVIL,MOTRIN     mycophenolate 500 MG tablet  Commonly known as:  CELLCEPT     sodium bicarbonate 650 MG tablet      TAKE these medications       acetaminophen 500 MG tablet  Commonly known as:  TYLENOL  Take 500-1,000 mg by mouth 4 (four) times daily as needed (pain).      amLODipine 5 MG tablet  Commonly known as:  NORVASC  Take 5 mg by mouth daily.     calcium acetate 667 MG capsule  Commonly known as:  PHOSLO  Take 2 capsules (1,334 mg total) by mouth 3 (three) times daily with meals.     haloperidol 5 MG tablet  Commonly known as:  HALDOL  Take 1 tablet (5 mg total) by mouth 2 (two) times daily.     methocarbamol 500 MG tablet  Commonly known as:  ROBAXIN  Take 1 tablet (500 mg total) by mouth 2 (two) times daily as needed for muscle spasms.     multivitamin Tabs tablet  Take 1 tablet by mouth at bedtime.        Discharge Instructions: Please refer to Patient Instructions section of EMR for full details.  Patient was counseled important signs and symptoms that should prompt return to medical care, changes in medications, dietary instructions, activity restrictions, and follow up appointments.   Follow-Up Appointments: Follow-up Information   Follow up with [facility]. (Walk-in only Monday-Friday 8am-3:30pm for follow up for psychiatric needs: counseling; medication management)    Contact information:   [address] [place] [address] [phone]      Follow up with Rodman Pickle, MD On 03/21/2013. (9 am)    Specialty:  Family Medicine   Contact information:   [address] [place] [address] [phone]       Glori Luis, MD 03/17/2013, 10:48 PM [facility]

## 2013-03-18 NOTE — Discharge Summary (Signed)
I discussed with Dr Sonnenberg.  I agree with their plans documented in their progress note for today.  

## 2013-03-21 ENCOUNTER — Inpatient Hospital Stay: Payer: Self-pay | Admitting: Family Medicine

## 2013-03-23 ENCOUNTER — Ambulatory Visit: Payer: PRIVATE HEALTH INSURANCE | Admitting: Family Medicine

## 2013-03-25 ENCOUNTER — Ambulatory Visit: Payer: Self-pay | Admitting: Family Medicine

## 2013-04-04 ENCOUNTER — Other Ambulatory Visit (HOSPITAL_COMMUNITY): Payer: Self-pay | Admitting: Family Medicine

## 2013-04-06 NOTE — Telephone Encounter (Signed)
Patient should get this refilled by her nephrologist in the future as they are managing her dialysis and renal disease at this time. Thanks.

## 2013-04-07 HISTORY — PX: AV FISTULA REPAIR: SHX563

## 2013-04-08 ENCOUNTER — Other Ambulatory Visit: Payer: Self-pay | Admitting: Vascular Surgery

## 2013-04-08 DIAGNOSIS — N186 End stage renal disease: Secondary | ICD-10-CM

## 2013-04-08 DIAGNOSIS — Z4931 Encounter for adequacy testing for hemodialysis: Secondary | ICD-10-CM

## 2013-04-26 ENCOUNTER — Encounter: Payer: Self-pay | Admitting: Vascular Surgery

## 2013-04-27 ENCOUNTER — Other Ambulatory Visit (HOSPITAL_COMMUNITY): Payer: Self-pay

## 2013-04-27 ENCOUNTER — Encounter: Payer: Self-pay | Admitting: Vascular Surgery

## 2013-05-03 ENCOUNTER — Encounter: Payer: Self-pay | Admitting: Vascular Surgery

## 2013-05-04 ENCOUNTER — Encounter: Payer: Self-pay | Admitting: Vascular Surgery

## 2013-05-04 ENCOUNTER — Other Ambulatory Visit (HOSPITAL_COMMUNITY): Payer: Self-pay

## 2013-05-13 ENCOUNTER — Encounter (HOSPITAL_COMMUNITY): Payer: Self-pay | Admitting: Emergency Medicine

## 2013-05-13 ENCOUNTER — Ambulatory Visit: Payer: Self-pay | Admitting: Family Medicine

## 2013-05-13 ENCOUNTER — Emergency Department (HOSPITAL_COMMUNITY): Payer: PRIVATE HEALTH INSURANCE

## 2013-05-13 ENCOUNTER — Inpatient Hospital Stay (HOSPITAL_COMMUNITY)
Admission: EM | Admit: 2013-05-13 | Discharge: 2013-05-18 | DRG: 885 | Disposition: A | Discharge status: Home / Self Care | Payer: PRIVATE HEALTH INSURANCE | Attending: Family Medicine | Admitting: Family Medicine

## 2013-05-13 DIAGNOSIS — M949 Disorder of cartilage, unspecified: Secondary | ICD-10-CM

## 2013-05-13 DIAGNOSIS — F209 Schizophrenia, unspecified: Principal | ICD-10-CM | POA: Diagnosis present

## 2013-05-13 DIAGNOSIS — M3214 Glomerular disease in systemic lupus erythematosus: Secondary | ICD-10-CM | POA: Diagnosis present

## 2013-05-13 DIAGNOSIS — F29 Unspecified psychosis not due to a substance or known physiological condition: Secondary | ICD-10-CM

## 2013-05-13 DIAGNOSIS — E0590 Thyrotoxicosis, unspecified without thyrotoxic crisis or storm: Secondary | ICD-10-CM | POA: Diagnosis present

## 2013-05-13 DIAGNOSIS — N2581 Secondary hyperparathyroidism of renal origin: Secondary | ICD-10-CM | POA: Diagnosis present

## 2013-05-13 DIAGNOSIS — R45850 Homicidal ideations: Secondary | ICD-10-CM

## 2013-05-13 DIAGNOSIS — D631 Anemia in chronic kidney disease: Secondary | ICD-10-CM | POA: Diagnosis present

## 2013-05-13 DIAGNOSIS — M899 Disorder of bone, unspecified: Secondary | ICD-10-CM | POA: Diagnosis present

## 2013-05-13 DIAGNOSIS — F319 Bipolar disorder, unspecified: Secondary | ICD-10-CM | POA: Diagnosis present

## 2013-05-13 DIAGNOSIS — D638 Anemia in other chronic diseases classified elsewhere: Secondary | ICD-10-CM | POA: Diagnosis present

## 2013-05-13 DIAGNOSIS — I120 Hypertensive chronic kidney disease with stage 5 chronic kidney disease or end stage renal disease: Secondary | ICD-10-CM | POA: Diagnosis present

## 2013-05-13 DIAGNOSIS — F309 Manic episode, unspecified: Secondary | ICD-10-CM

## 2013-05-13 DIAGNOSIS — M329 Systemic lupus erythematosus, unspecified: Secondary | ICD-10-CM | POA: Diagnosis present

## 2013-05-13 DIAGNOSIS — N039 Chronic nephritic syndrome with unspecified morphologic changes: Secondary | ICD-10-CM

## 2013-05-13 DIAGNOSIS — N185 Chronic kidney disease, stage 5: Secondary | ICD-10-CM | POA: Diagnosis present

## 2013-05-13 DIAGNOSIS — I10 Essential (primary) hypertension: Secondary | ICD-10-CM | POA: Diagnosis present

## 2013-05-13 DIAGNOSIS — Z992 Dependence on renal dialysis: Secondary | ICD-10-CM

## 2013-05-13 DIAGNOSIS — F23 Brief psychotic disorder: Secondary | ICD-10-CM

## 2013-05-13 DIAGNOSIS — N186 End stage renal disease: Secondary | ICD-10-CM | POA: Diagnosis present

## 2013-05-13 DIAGNOSIS — F259 Schizoaffective disorder, unspecified: Secondary | ICD-10-CM | POA: Diagnosis present

## 2013-05-13 DIAGNOSIS — Z79899 Other long term (current) drug therapy: Secondary | ICD-10-CM

## 2013-05-13 DIAGNOSIS — N19 Unspecified kidney failure: Secondary | ICD-10-CM

## 2013-05-13 DIAGNOSIS — F17200 Nicotine dependence, unspecified, uncomplicated: Secondary | ICD-10-CM | POA: Diagnosis present

## 2013-05-13 DIAGNOSIS — Z781 Physical restraint status: Secondary | ICD-10-CM | POA: Diagnosis present

## 2013-05-13 LAB — CBC WITH DIFFERENTIAL/PLATELET
Basophils Absolute: 0 10*3/uL (ref 0.0–0.1)
Basophils Relative: 0 % (ref 0–1)
EOS ABS: 0 10*3/uL (ref 0.0–0.7)
Eosinophils Relative: 1 % (ref 0–5)
HCT: 34.2 % — ABNORMAL LOW (ref 36.0–46.0)
HEMOGLOBIN: 10.9 g/dL — AB (ref 12.0–15.0)
Lymphocytes Relative: 30 % (ref 12–46)
Lymphs Abs: 1.2 10*3/uL (ref 0.7–4.0)
MCH: 28.2 pg (ref 26.0–34.0)
MCHC: 31.9 g/dL (ref 30.0–36.0)
MCV: 88.4 fL (ref 78.0–100.0)
MONOS PCT: 9 % (ref 3–12)
Monocytes Absolute: 0.4 10*3/uL (ref 0.1–1.0)
NEUTROS ABS: 2.5 10*3/uL (ref 1.7–7.7)
NEUTROS PCT: 61 % (ref 43–77)
PLATELETS: 146 10*3/uL — AB (ref 150–400)
RBC: 3.87 MIL/uL (ref 3.87–5.11)
RDW: 16.2 % — ABNORMAL HIGH (ref 11.5–15.5)
WBC: 4.1 10*3/uL (ref 4.0–10.5)

## 2013-05-13 LAB — COMPREHENSIVE METABOLIC PANEL
ALBUMIN: 4.3 g/dL (ref 3.5–5.2)
ALK PHOS: 50 U/L (ref 39–117)
ALT: 8 U/L (ref 0–35)
AST: 17 U/L (ref 0–37)
BILIRUBIN TOTAL: 0.4 mg/dL (ref 0.3–1.2)
BUN: 61 mg/dL — AB (ref 6–23)
CHLORIDE: 107 meq/L (ref 96–112)
CO2: 16 mEq/L — ABNORMAL LOW (ref 19–32)
Calcium: 9.9 mg/dL (ref 8.4–10.5)
Creatinine, Ser: 16.03 mg/dL — ABNORMAL HIGH (ref 0.50–1.10)
GFR calc Af Amer: 3 mL/min — ABNORMAL LOW (ref >90)
GFR calc non Af Amer: 3 mL/min — ABNORMAL LOW (ref >90)
Glucose, Bld: 75 mg/dL (ref 70–99)
POTASSIUM: 5 meq/L (ref 3.7–5.3)
SODIUM: 145 meq/L (ref 137–147)
TOTAL PROTEIN: 8 g/dL (ref 6.0–8.3)

## 2013-05-13 LAB — ETHANOL: Alcohol, Ethyl (B): <11 mg/dL (ref 0–11)

## 2013-05-13 LAB — SALICYLATE LEVEL: Salicylate Lvl: <2 mg/dL — ABNORMAL LOW (ref 2.8–20.0)

## 2013-05-13 LAB — ACETAMINOPHEN LEVEL: Acetaminophen (Tylenol), Serum: <15 ug/mL (ref 10–30)

## 2013-05-13 MED ORDER — HALOPERIDOL LACTATE 5 MG/ML IJ SOLN
5.0000 mg | Freq: Once | INTRAMUSCULAR | Status: AC
  Administered 2013-05-13: 5 mg via INTRAMUSCULAR
  Filled 2013-05-13: qty 1

## 2013-05-13 MED ORDER — IBUPROFEN 600 MG PO TABS
600.0000 mg | ORAL_TABLET | Freq: Three times a day (TID) | ORAL | Status: DC | PRN
  Filled 2013-05-13: qty 1

## 2013-05-13 MED ORDER — LORAZEPAM 1 MG PO TABS: 1.0000 mg | ORAL_TABLET | Freq: Three times a day (TID) | ORAL | Status: DC | PRN

## 2013-05-13 MED ORDER — NICOTINE 21 MG/24HR TD PT24
21.0000 mg | MEDICATED_PATCH | Freq: Every day | TRANSDERMAL | Status: DC
Start: 2013-05-13 — End: 2013-05-18
  Administered 2013-05-15: 21 mg via TRANSDERMAL
  Filled 2013-05-13 (×5): qty 1

## 2013-05-13 MED ORDER — LORAZEPAM 2 MG/ML IJ SOLN: 2.0000 mg | Freq: Once | INTRAMUSCULAR | Status: DC

## 2013-05-13 MED ORDER — ZOLPIDEM TARTRATE 5 MG PO TABS: 5.0000 mg | ORAL_TABLET | Freq: Every evening | ORAL | Status: DC | PRN

## 2013-05-13 MED ORDER — RISPERIDONE 0.5 MG PO TABS
2.0000 mg | ORAL_TABLET | Freq: Every day | ORAL | Status: DC
  Filled 2013-05-13: qty 4

## 2013-05-13 MED ORDER — ONDANSETRON HCL 4 MG PO TABS: 4.0000 mg | ORAL_TABLET | Freq: Three times a day (TID) | ORAL | Status: DC | PRN

## 2013-05-13 MED ORDER — ACETAMINOPHEN 325 MG PO TABS
650.0000 mg | ORAL_TABLET | ORAL | Status: DC | PRN
  Administered 2013-05-14 – 2013-05-16 (×2): 325 mg via ORAL
  Filled 2013-05-13 (×2): qty 2

## 2013-05-13 MED ORDER — ALUM & MAG HYDROXIDE-SIMETH 200-200-20 MG/5ML PO SUSP: 30.0000 mL | ORAL | Status: DC | PRN

## 2013-05-13 NOTE — ED Notes (Signed)
Pt completed telepsych: pt remains calm and cooperative. Now sleeping in bed, sitter at bedside. Per telepsych, Marijean Bravo, pt will need to be admitted for psych and dialysis. MD Audie Pinto made aware.    New IVC paperwork completed,delivered by [employer]. Placed in pt's chart. Old IVC paperwork shredded.

## 2013-05-13 NOTE — ED Notes (Signed)
Per Raquel Sarna at ext [phone]. TTS will be rescheduled due to shift change. They will call back when ready for assessment.
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
## 2013-05-13 NOTE — ED Notes (Signed)
Pt is waking up and seems cooperative. She expresses the need to get home and make a meatloaf for her and her family. Pt states that as soon as she gets consulted she will be "getting up out of here." Also states "I have a center for my dialysis and I don't need to be seen here for that."
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
## 2013-05-13 NOTE — ED Notes (Signed)
Unable to wake patient for assessment. Pt. Appears drowsy from meds.

## 2013-05-13 NOTE — ED Notes (Signed)
Pt sleeping. [employer] at bedside.

## 2013-05-13 NOTE — ED Notes (Signed)
Pt calming down and more cooperative. [place] remain at bedside.

## 2013-05-13 NOTE — BH Assessment (Signed)
Assessment complete. Per [employer], [facility] at [facility], adult 400-hall is at capacity. Consulted with Serena Colonel, NP with recommends inpatient psychiatric treatment at a facility that can provide dialysis. TTS will contact other facilities for placement. Notified Dr. Audie Pinto and Elyn Peers, RN of recommendation.    Orpah Greek Rosana Hoes, [facility] Triage Specialist

## 2013-05-13 NOTE — ED Notes (Signed)
Affidavit and Petition for Involuntary Commitment originally completed by Librarian, academic and signed by both PA and MD however printed name only the PA.  Completed new paper work and Tax adviser only signed and currently waiting for Education officer, museum to [employer] paper work.

## 2013-05-13 NOTE — ED Notes (Signed)
PT eating meal. Pt remains calm and cooperative. PT made aware that she will be spending night here. Pt states "I have an apartment I can go home to, but okay." Sitter at bedside.

## 2013-05-13 NOTE — ED Notes (Signed)
Sitter at beside

## 2013-05-13 NOTE — ED Notes (Signed)
Security at nurses station to lock up valuables.

## 2013-05-13 NOTE — ED Provider Notes (Signed)
Medical screening examination/treatment/procedure(s) were conducted as a shared visit with non-physician practitioner(s) or resident and myself. I personally evaluated the patient during the encounter and agree with the findings and plan unless otherwise indicated.  I have personally reviewed any xrays and/ or EKG's with the provider and I agree with interpretation.  CRITICAL CARE Performed by: Mariea Clonts  Total critical care time: 35 min Critical care time was exclusive of separately billable procedures and treating other patients.  Critical care was necessary to treat or prevent imminent or life-threatening deterioration.  Critical care was time spent personally by me on the following activities: development of treatment plan with patient and/or surrogate as well as nursing, discussions with consultants, evaluation of patient's response to treatment, examination of patient, obtaining history from patient or surrogate, ordering and performing treatments and interventions, ordering and review of laboratory studies, ordering and review of radiographic studies, pulse oximetry and re-evaluation of patient's condition.  Bipolar, ESRD, Schizophrenia hx, pt brought in by PD for psychosis and HI. Unsure onset, worsening recently. Difficult HPI due to acute mania/ psychosis. Pt has pressured speech, flight of ideas, pt knows location however feels I am at a party and her daughter is 93 yo, perrl, horiz eye movement intact, supple neck, afebrile. Clinically acute psychosis and acute mania. Plan for medical clearance and psych evaluation. Pt aggressive and not cooperating, threatening, haldol/ ativan and restraints required. Pt required police/ security for treatment. Multiple rechecks.  Acute psychosis, HI, Acute mania   Mariea Clonts, MD 05/13/13 2009

## 2013-05-13 NOTE — ED Notes (Signed)
[employer] here to serve the patient IVC papers.

## 2013-05-13 NOTE — BH Assessment (Signed)
[place] Assessment Progress Note   Update:  Attempted to assess pt via tele assessment @ 1630.  Pt's nurse, Cinda Quest present, and pt unable to be assessed at this time.  Pt is sedated, as she was given Haldol.  Pt's nurse will call TTS at [phone] when pt wakes to attempt to assess the pt again.  TTS and ED updated.    Shaune Pascal, MS, [facility] Licensed Professional Counselor Triage Specialist

## 2013-05-13 NOTE — BH Assessment (Signed)
[place] Assessment Progress Note   Called, spoke with Domenic Moras, PA-C, to obtain clinical for pt.  Pt's tele assessment scheduled for 1630 with pt's nurse, Malinda, @ [phone].  Shaune Pascal, MS, [facility] Licensed Professional Counselor Triage Specialist

## 2013-05-13 NOTE — ED Notes (Addendum)
Pt. Has two valuable envelopes. One has safe deposit box #3 . Pt. Items bagged items x2 placed in belonging bins

## 2013-05-13 NOTE — ED Notes (Signed)
TTS monitor placed in room. Pt. Is talking to screen without anyone on the screens. Appears to be seeing things and repeats "[facility] [employer]."

## 2013-05-13 NOTE — ED Notes (Signed)
Patient transported to X-ray 

## 2013-05-13 NOTE — ED Notes (Addendum)
Pt walked to POD C Room 22 with sitter.

## 2013-05-14 DIAGNOSIS — R45850 Homicidal ideations: Secondary | ICD-10-CM | POA: Diagnosis not present

## 2013-05-14 DIAGNOSIS — M329 Systemic lupus erythematosus, unspecified: Secondary | ICD-10-CM

## 2013-05-14 DIAGNOSIS — F259 Schizoaffective disorder, unspecified: Secondary | ICD-10-CM | POA: Diagnosis present

## 2013-05-14 DIAGNOSIS — F17200 Nicotine dependence, unspecified, uncomplicated: Secondary | ICD-10-CM | POA: Diagnosis present

## 2013-05-14 DIAGNOSIS — I120 Hypertensive chronic kidney disease with stage 5 chronic kidney disease or end stage renal disease: Secondary | ICD-10-CM | POA: Diagnosis present

## 2013-05-14 DIAGNOSIS — I10 Essential (primary) hypertension: Secondary | ICD-10-CM

## 2013-05-14 DIAGNOSIS — N186 End stage renal disease: Secondary | ICD-10-CM | POA: Diagnosis present

## 2013-05-14 DIAGNOSIS — F209 Schizophrenia, unspecified: Secondary | ICD-10-CM | POA: Diagnosis present

## 2013-05-14 DIAGNOSIS — F319 Bipolar disorder, unspecified: Secondary | ICD-10-CM | POA: Diagnosis present

## 2013-05-14 DIAGNOSIS — M899 Disorder of bone, unspecified: Secondary | ICD-10-CM | POA: Diagnosis present

## 2013-05-14 DIAGNOSIS — Z79899 Other long term (current) drug therapy: Secondary | ICD-10-CM | POA: Diagnosis not present

## 2013-05-14 DIAGNOSIS — F23 Brief psychotic disorder: Secondary | ICD-10-CM | POA: Diagnosis present

## 2013-05-14 DIAGNOSIS — E0590 Thyrotoxicosis, unspecified without thyrotoxic crisis or storm: Secondary | ICD-10-CM | POA: Diagnosis present

## 2013-05-14 DIAGNOSIS — Z781 Physical restraint status: Secondary | ICD-10-CM | POA: Diagnosis present

## 2013-05-14 DIAGNOSIS — D638 Anemia in other chronic diseases classified elsewhere: Secondary | ICD-10-CM | POA: Diagnosis present

## 2013-05-14 DIAGNOSIS — D631 Anemia in chronic kidney disease: Secondary | ICD-10-CM | POA: Diagnosis present

## 2013-05-14 DIAGNOSIS — N058 Unspecified nephritic syndrome with other morphologic changes: Secondary | ICD-10-CM

## 2013-05-14 DIAGNOSIS — F29 Unspecified psychosis not due to a substance or known physiological condition: Secondary | ICD-10-CM | POA: Diagnosis present

## 2013-05-14 DIAGNOSIS — N2581 Secondary hyperparathyroidism of renal origin: Secondary | ICD-10-CM | POA: Diagnosis present

## 2013-05-14 DIAGNOSIS — Z992 Dependence on renal dialysis: Secondary | ICD-10-CM | POA: Diagnosis not present

## 2013-05-14 LAB — CBC
HEMATOCRIT: 31.7 % — AB (ref 36.0–46.0)
Hemoglobin: 10.2 g/dL — ABNORMAL LOW (ref 12.0–15.0)
MCH: 28.3 pg (ref 26.0–34.0)
MCHC: 32.2 g/dL (ref 30.0–36.0)
MCV: 88.1 fL (ref 78.0–100.0)
PLATELETS: 192 10*3/uL (ref 150–400)
RBC: 3.6 MIL/uL — ABNORMAL LOW (ref 3.87–5.11)
RDW: 15.8 % — ABNORMAL HIGH (ref 11.5–15.5)
WBC: 3.1 10*3/uL — ABNORMAL LOW (ref 4.0–10.5)

## 2013-05-14 LAB — RENAL FUNCTION PANEL
Albumin: 3.5 g/dL (ref 3.5–5.2)
BUN: 62 mg/dL — ABNORMAL HIGH (ref 6–23)
CO2: 17 meq/L — ABNORMAL LOW (ref 19–32)
Calcium: 9.1 mg/dL (ref 8.4–10.5)
Chloride: 105 meq/L (ref 96–112)
Creatinine, Ser: 16.42 mg/dL — ABNORMAL HIGH (ref 0.50–1.10)
GFR calc Af Amer: 3 mL/min — ABNORMAL LOW
GFR calc non Af Amer: 3 mL/min — ABNORMAL LOW
Glucose, Bld: 62 mg/dL — ABNORMAL LOW (ref 70–99)
Phosphorus: 7.4 mg/dL — ABNORMAL HIGH (ref 2.3–4.6)
Potassium: 5.1 meq/L (ref 3.7–5.3)
Sodium: 141 meq/L (ref 137–147)

## 2013-05-14 MED ORDER — HEPARIN SODIUM (PORCINE) 1000 UNIT/ML IJ SOLN
1300.0000 [IU] | Freq: Once | INTRAMUSCULAR | Status: AC
  Administered 2013-05-14: 1300 [IU] via INTRAVENOUS

## 2013-05-14 MED ORDER — CALCIUM ACETATE 667 MG PO CAPS
1334.0000 mg | ORAL_CAPSULE | Freq: Three times a day (TID) | ORAL | Status: DC
  Administered 2013-05-14 – 2013-05-16 (×6): 1334 mg via ORAL
  Filled 2013-05-14 (×10): qty 2

## 2013-05-14 MED ORDER — METHOCARBAMOL 500 MG PO TABS: 500.0000 mg | ORAL_TABLET | Freq: Two times a day (BID) | ORAL | Status: DC | PRN

## 2013-05-14 MED ORDER — AMLODIPINE BESYLATE 5 MG PO TABS
5.0000 mg | ORAL_TABLET | Freq: Every day | ORAL | Status: DC
  Administered 2013-05-14 – 2013-05-18 (×4): 5 mg via ORAL
  Filled 2013-05-14 (×5): qty 1

## 2013-05-14 MED ORDER — HALOPERIDOL 5 MG PO TABS
5.0000 mg | ORAL_TABLET | Freq: Two times a day (BID) | ORAL | Status: DC
  Administered 2013-05-14 – 2013-05-18 (×5): 5 mg via ORAL
  Filled 2013-05-14 (×10): qty 1

## 2013-05-14 MED ORDER — RENA-VITE PO TABS
1.0000 | ORAL_TABLET | Freq: Every day | ORAL | Status: DC
  Administered 2013-05-14: 21:00:00 via ORAL
  Administered 2013-05-16 – 2013-05-17 (×2): 1 via ORAL
  Filled 2013-05-14 (×5): qty 1

## 2013-05-14 MED ORDER — HALOPERIDOL LACTATE 5 MG/ML IJ SOLN
5.0000 mg | Freq: Once | INTRAMUSCULAR | Status: AC
  Administered 2013-05-14: 5 mg via INTRAVENOUS
  Filled 2013-05-14 (×2): qty 1

## 2013-05-14 NOTE — H&P (Signed)
Seen and examined.  Discussed with Dr. Venetia Maxon.  Agree with his management and documentation. Briefly, 33 yo female with acute psychiatric illness.  Unclear to me whether this is mania or acute psychosis.  Needs inpatient treatment for patient safety but a bed is not immediately available.  Admitted for patient safety, symptom control and dialysis.  She is on chronic haloperidol therapy.  We have given her extra haloperidol and she seems much calmer this afternoon.  We are consulting psych to help with further med management.  For now, continue extra haloperidol.

## 2013-05-14 NOTE — Progress Notes (Signed)
MHT faxed [facility] who would consider pt with dialysis.  Pt was later declined due to aggression.  Wyvonnia Dusky, MHT/NS

## 2013-05-14 NOTE — ED Notes (Signed)
Sitter at bedside when patient taken to hemodialysis.  Staffing office called and notified of pt change in location.  New sitter at 7am to be advised on new location and duration of time for dialysis.

## 2013-05-14 NOTE — Progress Notes (Signed)
Per TTS, patient has a bed at [facility] for medical/psych care.   No indication for admission to [facility] and patient will be going to [facility] in the next 8 hours. If patient remains, Dr. Justin Mend (nephrology) has agreed to dialyze.  Additionally, if her transport to [facility] is going to be belabored, TTS instructed to page Family medicine as patient will need to be admitted to receive dialysis until transfer can be completed.   Will follow up with TTS in the am regarding disposition.
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
## 2013-05-14 NOTE — Progress Notes (Signed)
MHT contacted the following hospital who declined pt due to dialysis:  1)[facility] 2)[facility] 8)[facility] 10)[facility] 11)[facility] 12)[facility] 13)[facility]  Sara [name], MHT/NS

## 2013-05-14 NOTE — Progress Notes (Signed)
MHT spoke with Sara Spikes, DO who stated she would be admitted to the medical floor after 8am this morning due to non-placement in an inpatient psychiatric facility.    Wyvonnia Dusky, MHT/NS

## 2013-05-14 NOTE — Consult Note (Signed)
I have seen and examined this patient and agree with the plan of care  [facility] W 05/14/2013, 1:14 PM

## 2013-05-14 NOTE — ED Notes (Addendum)
Dr. Audie Pinto requesting pt to be admitted to [facility] Medicine secondary to pt needing dialysis Saturday am. ACT team counselor working on securing placement for pt at [facility] [employer] care. Family Medicine MD to talk with Renal MD to see if pt can have dialysis while waiting for [facility] placement. ACT Team counselor and Family Medicine MD to talk to Dr. Audie Pinto about [facility] placement rather than admitting to the renal floor.

## 2013-05-14 NOTE — ED Notes (Signed)
Pt had sitter to call out to the nurses station requesting a replacement bandage for her chest catheter.  Old dressing and new dressing applied by patient.  Site was clean, dry and intact.  Pt was cooperative and calm.

## 2013-05-14 NOTE — Progress Notes (Signed)
Per Sharyn Lull at [facility], no psych bed available at this time, however, will call to see if she could get approval to put her in a medical department until a psych bed becomes available. MHT had Dr.Manly call at [facility] transfers request.  Wyvonnia Dusky, MHT/NS

## 2013-05-14 NOTE — ED Notes (Signed)
Pt may be possibly transferred to [facility] within next 8 hrs. Pt to received one time dialysis if pt does get a bed at [facility]. If pt does not get a bed at [facility], or transfer takes longer than 8 hrs, pt will be admitted to the renal floor at [facility].
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
## 2013-05-14 NOTE — Progress Notes (Signed)
MHT fax referral to [facility] after completing a demographics with Marland Mcalpine RN.  [place] DPOE#[id number] good until 05/21/13.  Referral being review for waitlist currently.  Wyvonnia Dusky, MHT/NS

## 2013-05-14 NOTE — Progress Notes (Signed)
05/14/13 1450 nsg Patient is sleepy unable to give info for admission. Belongings placed at nurses station. Pt has a Actuary (suicide). Pt's RN notified.
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
## 2013-05-15 LAB — RENAL FUNCTION PANEL
Albumin: 3.7 g/dL (ref 3.5–5.2)
BUN: 23 mg/dL (ref 6–23)
CALCIUM: 9.8 mg/dL (ref 8.4–10.5)
CHLORIDE: 101 meq/L (ref 96–112)
CO2: 24 meq/L (ref 19–32)
Creatinine, Ser: 9.19 mg/dL — ABNORMAL HIGH (ref 0.50–1.10)
GFR, EST AFRICAN AMERICAN: 6 mL/min — AB (ref >90)
GFR, EST NON AFRICAN AMERICAN: 5 mL/min — AB (ref >90)
Glucose, Bld: 79 mg/dL (ref 70–99)
Phosphorus: 6.1 mg/dL — ABNORMAL HIGH (ref 2.3–4.6)
Potassium: 4.3 mEq/L (ref 3.7–5.3)
SODIUM: 142 meq/L (ref 137–147)

## 2013-05-15 MED ORDER — BENZTROPINE MESYLATE 1 MG PO TABS
1.0000 mg | ORAL_TABLET | Freq: Two times a day (BID) | ORAL | Status: DC
  Filled 2013-05-15 (×8): qty 1

## 2013-05-15 MED ORDER — DIVALPROEX SODIUM 500 MG PO DR TAB
500.0000 mg | DELAYED_RELEASE_TABLET | Freq: Two times a day (BID) | ORAL | Status: DC
  Filled 2013-05-15 (×8): qty 1

## 2013-05-15 MED ORDER — HYDRALAZINE HCL 20 MG/ML IJ SOLN: 5.0000 mg | Freq: Four times a day (QID) | INTRAMUSCULAR | Status: DC | PRN
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
## 2013-05-15 NOTE — Progress Notes (Signed)
Seen and examined.  Patient is clearer and brighter today.  She wants to go home.  She is medically stable and dispo will depend on repeat psych eval.  OK to DC whenever from our standpoint.

## 2013-05-15 NOTE — Progress Notes (Signed)
05/15/13 1616 nsg Patient requesting to order her own food (outside food) MD notified orders noted. Patient requested for her debit  mastercard  RN went down to security to get her card and gave to patient.

## 2013-05-15 NOTE — Progress Notes (Signed)
Subjective:  Eating brk/ tolerated hd yesterday Objective Vital signs in last 24 hours: Filed Vitals:   05/14/13 1128 05/14/13 1720 05/14/13 2054 05/15/13 0450  BP: 138/96 137/89 131/87 134/97  Pulse: 67 70 71 83  Temp: 97.4 F (36.3 C) 98.9 F (37.2 C) 98.3 F (36.8 C) 98.4 F (36.9 C)  TempSrc: Oral Oral Oral Oral  Resp: 20 18 18 18   Height:      Weight:   62.5 kg (137 lb 12.6 oz)   SpO2: 100% 100% 98% 95%   Weight change: -2 kg (-4 lb 6.6 oz)  Labs: Basic Metabolic Panel:  Recent Labs Lab 05/13/13 1138 05/14/13 0716 05/15/13 0641  NA 145 141 142  K 5.0 5.1 4.3  CL 107 105 101  CO2 16* 17* 24  GLUCOSE 75 62* 79  BUN 61* 62* 23  CREATININE 16.03* 16.42* 9.19*  CALCIUM 9.9 9.1 9.8  PHOS  --  7.4* 6.1*   Liver Function Tests:  Recent Labs Lab 05/13/13 1138 05/14/13 0716 05/15/13 0641  AST 17  --   --   ALT 8  --   --   ALKPHOS 50  --   --   BILITOT 0.4  --   --   PROT 8.0  --   --   ALBUMIN 4.3 3.5 3.7   No results found for this basename: LIPASE, AMYLASE,  in the last 168 hours No results found for this basename: AMMONIA,  in the last 168 hours CBC:  Recent Labs Lab 05/13/13 1138 05/14/13 0716  WBC 4.1 3.1*  NEUTROABS 2.5  --   HGB 10.9* 10.2*  HCT 34.2* 31.7*  MCV 88.4 88.1  PLT 146* 192   Cardiac Enzymes: No results found for this basename: CKTOTAL, CKMB, CKMBINDEX, TROPONINI,  in the last 168 hours CBG: No results found for this basename: GLUCAP,  in the last 168 hours  Studies/Results: Dg Chest 2 View  05/13/2013   CLINICAL DATA:  Central line placement.  Cough.  EXAM: CHEST  2 VIEW  COMPARISON:  06/04/2012.  FINDINGS: The heart remains normal in size and the lungs remain clear with normal vascularity. Interval right jugular double-lumen catheter with the catheter tips in the superior vena cava. No pneumothorax. Normal appearing bones.  IMPRESSION: Right jugular catheter tips in the superior vena cava. No pneumothorax.   Electronically  Signed   By: Enrique Sack M.D.   On: 05/13/2013 15:05   Medications:   . amLODipine  5 mg Oral Daily  . calcium acetate  1,334 mg Oral TID WC  . haloperidol  5 mg Oral BID  . LORazepam  2 mg Intramuscular Once  . multivitamin  1 tablet Oral QHS  . nicotine  21 mg Transdermal Daily   General: Alert thin, young BF very talkative NAD  Heart: RRR, no rub or Murmur appreciated  Lungs: CTA bilat , no rales or Wheezing  Abdomen: Soft, NT , ND  Extremities: No bipedal edema  Dialysis Access: R IJ Perm cath  / R FA AVF pos . Bruit   Dialysis Orders: Center: [facility] on TTS .  EDW 68.5 HD Bath 2/0 k , 2.0ca Time 4/0 hrs Heparin 6000. Access R IJ< RFA AVF BFR 400 DFR 800 Hector al 1 mcg IV/HD Epogen 20000 Units IV/HD Venofer 50mg  weekly   Assessment/Plan  1. Pscychosis with ho Bipolar Disease/ schizophrenia= per admit team/awaiting IN PT PSY admit 2. ESRD - HD TTS ([facility] op_) noted CR 16 to 9  after hd yesterday related to missing op hd 3. Nonadherent with HD Treatments= related Problem 1. 4. Hypertension/volume - stable / wt post hd 62.5 ( edw 68.5 pre admit) / amlodipine hs 5. Anemia - esa and weekly iron on hd 6. Metabolic bone disease - Vit d and Phos binder= Phoslo 7. Nutrition - Renal diet and renal vitamin  Ernest Haber, PA-C [place] [phone] 05/15/2013,8:29 AM  LOS: 2 days

## 2013-05-15 NOTE — Progress Notes (Signed)
Family Medicine Teaching Service Daily Progress Note Intern Pager: [phone]  Patient name: Sara [name] Medical record number: [id number] Date of birth: 1980/07/25 Age: 33 y.o. Gender: female  Primary Care Provider: Tommi Rumps, MD Consultants: Psychiatry Code Status: Full  Pt Overview and Major Events to Date:  2/7- admitted, awaiting formal psych eval, HD by nephro 2/8 - Awaiting psych eval  Assessment and Plan: Sara [name] is a 33 y.o. female presenting with florid psychosis in the ED, more consistent with acute mania once up to the floor. 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 Pt presented to the ED and was involuntarily committed by [place] PD; telepsych recommended inpt placement in the ED and expected placement at [facility] was not completed (bed not available) and pt thus admitted due to needing HD but appears medically stable for psych placement if needed.   #Schizophrenia with acute psychosis / mania -  - no appear to have acute infection, salicylates / Tylenol / EtOH negative; urine pregnancy, UA, and UDS ordered by the ED with results pending.  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 Justin Mend and associates  - renal function daily to monitor electrolytes  - renal diet, renal vitamin, and Vit.D / Phoslo per renal recs   #HTN - intermittently elevated, likely secondary to acute mania / agitation, ?noncompliance with home meds  -  ordered for amlodipine 5 mg daily (home med)  - Start hydralazine with parameters  #Anemia of chronic disease - likely secondary to CKD, Hb stable; ESA and iron on HD  FEN/GI: saline lock IV  Prophylaxis: SCD's  Disposition: Continue Tele, awaiting psych eval and likely placement  Subjective:  Feeling well today, No dyspnea. Describes her mood as normal. Denies SI/HI, and auditory/visual hallucinations  Objective: Temp:  [97.4 F (36.3 C)-98.9 F (37.2 C)] 98.4 F (36.9 C) (02/08 0450) Pulse Rate:  [67-83] 77 (02/08 0910) Resp:  [18-20] 19 (02/08 0910) BP: (131-151)/(87-101) 151/101 mmHg (02/08 0910) SpO2:  [95 %-100 %] 99 % (02/08 0910) Weight:  [137 lb 12.6 oz (62.5 kg)-139 lb 1.8 oz (63.1 kg)] 137 lb 12.6 oz (62.5 kg) (02/07 2054) Physical Exam: Gen: NAD, alert, cooperative with exam HEENT: NCAT CV: RRR, good S1/S2, no murmur Resp: CTABL, no wheezes, non-labored Abd: SNTND, BS present, no guarding or organomegaly Ext: No edema, warm Neuro: Alert and oriented, No gross deficits Psych- presured speech, denies SI/HI, denies a/v hallucinations,    Laboratory:  Recent Labs Lab 05/13/13 1138 05/14/13 0716  WBC 4.1 3.1*  HGB 10.9* 10.2*  HCT 34.2* 31.7*  PLT 146* 192    Recent Labs Lab 05/13/13 1138 05/14/13 0716 05/15/13 0641  NA 145 141 142  K 5.0 5.1 4.3  CL 107 105 101  CO2 16* 17* 24  BUN 61* 62* 23  CREATININE 16.03* 16.42* 9.19*  CALCIUM 9.9 9.1 9.8  PROT 8.0  --   --  BILITOT 0.4  --   --   ALKPHOS 50  --   --   ALT 8  --   --   AST 17  --   --   GLUCOSE 75 62* 79   Salicylate and tylenol WNL EtOH- <11   Imaging/Diagnostic Tests: CXR 05/13/2013 IMPRESSION:  Right jugular catheter tips in the superior vena cava. No  pneumothorax.   Timmothy Euler, MD 05/15/2013, 10:07 AM PGY-2, [facility] Intern pager: [phone], text pages welcome

## 2013-05-15 NOTE — Progress Notes (Signed)
I have seen and examined this patient and agree with the plan of care  [facility] 05/15/2013, 11:07 AM

## 2013-05-15 NOTE — Progress Notes (Signed)
Patient refused cogentin, Depakote, haldol & vitamin.

## 2013-05-15 NOTE — Progress Notes (Signed)
Patient refused her Depakote  And Congentin.She said' that crazy medication'' i dont want it''.

## 2013-05-16 LAB — RENAL FUNCTION PANEL
ALBUMIN: 3.6 g/dL (ref 3.5–5.2)
BUN: 32 mg/dL — ABNORMAL HIGH (ref 6–23)
CO2: 22 mEq/L (ref 19–32)
CREATININE: 10.8 mg/dL — AB (ref 0.50–1.10)
Calcium: 9.6 mg/dL (ref 8.4–10.5)
Chloride: 102 mEq/L (ref 96–112)
GFR calc Af Amer: 5 mL/min — ABNORMAL LOW (ref >90)
GFR, EST NON AFRICAN AMERICAN: 4 mL/min — AB (ref >90)
Glucose, Bld: 75 mg/dL (ref 70–99)
POTASSIUM: 4.8 meq/L (ref 3.7–5.3)
Phosphorus: 6.4 mg/dL — ABNORMAL HIGH (ref 2.3–4.6)
Sodium: 141 mEq/L (ref 137–147)

## 2013-05-16 MED ORDER — CALCIUM ACETATE 667 MG PO CAPS
2001.0000 mg | ORAL_CAPSULE | Freq: Three times a day (TID) | ORAL | Status: DC
  Administered 2013-05-16 – 2013-05-18 (×5): 2001 mg via ORAL
  Filled 2013-05-16 (×9): qty 3

## 2013-05-16 MED ORDER — OLANZAPINE 10 MG IM SOLR
7.5000 mg | Freq: Two times a day (BID) | INTRAMUSCULAR | Status: DC | PRN
  Filled 2013-05-16: qty 10

## 2013-05-16 MED ORDER — DOXERCALCIFEROL 4 MCG/2ML IV SOLN
1.0000 ug | INTRAVENOUS | Status: DC
  Administered 2013-05-17: 1 ug via INTRAVENOUS
  Filled 2013-05-16: qty 2

## 2013-05-16 MED ORDER — DARBEPOETIN ALFA-POLYSORBATE 25 MCG/0.42ML IJ SOLN
25.0000 ug | INTRAMUSCULAR | Status: DC
  Administered 2013-05-17: 25 ug via INTRAVENOUS
  Filled 2013-05-16: qty 0.42

## 2013-05-16 MED ORDER — SODIUM CHLORIDE 0.9 % IV SOLN
62.5000 mg | INTRAVENOUS | Status: DC
  Administered 2013-05-17: 62.5 mg via INTRAVENOUS
  Filled 2013-05-16 (×2): qty 5

## 2013-05-16 MED ORDER — OLANZAPINE 7.5 MG PO TABS
7.5000 mg | ORAL_TABLET | Freq: Two times a day (BID) | ORAL | Status: DC | PRN
  Filled 2013-05-16: qty 1

## 2013-05-16 MED ORDER — OLANZAPINE 10 MG IM SOLR
10.0000 mg | Freq: Once | INTRAMUSCULAR | Status: DC
  Filled 2013-05-16: qty 10

## 2013-05-16 NOTE — Progress Notes (Signed)
Family Medicine Teaching Service Daily Progress Note Intern Pager: [phone]  Patient name: Sara [name] Medical record number: [id number] Date of birth: [birth year] Age: 33 y.o. Gender: female  Primary Care Provider: Tommi Rumps, MD Consultants: Psychiatry Code Status: Full  Pt Overview and Major Events to Date:  2/7 - admitted, awaiting formal psych eval, HD by nephro 2/8 - Pt refusing depakote  Assessment and Plan: Sara [name] is a 33 y.o. female presenting with florid psychosis in the ED, more consistent with acute mania once up to the floor. 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 Pt presented to the ED and was involuntarily committed by [place] [employer]; telepsych recommended inpt placement in the ED and expected placement at [facility] was not completed (bed not available) and pt thus admitted due to needing HD but appears medically stable for psych placement if needed.   #Schizophrenia with acute psychosis / mania -  - no appear to have acute infection, salicylates / Tylenol / EtOH negative; urine pregnancy, UA, and UDS ordered by the ED with results pending.  - ordered for home dose of Haldol 5 mg PO BID, has received 1x a day past 2 days  - pending formal psychiatry consult for assistance with recommendations for placement / medication management. 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 Justin Mend and associates  - renal function daily to monitor electrolytes  - renal diet, renal vitamin, and Vit.D / Phoslo per renal recs   #HTN - intermittently  elevated, likely secondary to acute mania / agitation, ?noncompliance with home meds  - ordered for amlodipine 5 mg daily (home med)  - Start hydralazine with parameters  #Anemia of chronic disease - likely secondary to CKD, Hb stable; ESA and iron on HD  FEN/GI: saline lock IV  Prophylaxis: SCD's  Disposition: Continue Tele, awaiting psych eval and likely placement  Subjective:  No complaints this AM. Says she wants to go home, feels ready, states she was brought in under "false pretenses" and "lies". Denies SI/HI, denies hearing voices or hallucinations.  Objective: Temp:  [98.4 F (36.9 C)-98.8 F (37.1 C)] 98.4 F (36.9 C) (02/09 0436) Pulse Rate:  [64-79] 73 (02/09 0436) Resp:  [17-20] 18 (02/09 0436) BP: (125-151)/(85-101) 138/93 mmHg (02/09 0436) SpO2:  [97 %-99 %] 98 % (02/09 0436) Weight:  [142 lb (64.411 kg)] 142 lb (64.411 kg) (02/08 2100) Physical Exam: Gen: NAD, alert, cooperative with exam HEENT: NCAT CV: RRR, good S1/S2, no murmur Resp: CTABL, no wheezes, non-labored Abd: Soft, NTND, BS present, no guarding or organomegaly Ext: No edema, warm Neuro: Alert and oriented, No gross deficits Psych: mood is "hyper". pressured speech, denies SI/HI, denies a/v hallucinations    Laboratory:  Recent Labs Lab 05/13/13 1138 05/14/13 0716  WBC 4.1 3.1*  HGB 10.9* 10.2*  HCT 34.2* 31.7*  PLT 146* 192    Recent Labs Lab 05/13/13 1138 05/14/13 0716 05/15/13 0641  NA 145 141 142  K 5.0 5.1 4.3  CL 107 105 101  CO2 16* 17* 24  BUN 61* 62* 23  CREATININE 16.03* 16.42* 9.19*  CALCIUM 9.9 9.1 9.8  PROT 8.0  --   --   BILITOT 0.4  --   --   ALKPHOS 50  --   --   ALT 8  --   --   AST 17  --   --   GLUCOSE 75 62* 79   Salicylate and tylenol WNL EtOH- <11   Imaging/Diagnostic Tests: CXR 05/13/2013 IMPRESSION:  Right jugular catheter tips in the superior vena cava. No  pneumothorax.   Tawanna Sat, MD 05/16/2013, 7:28 AM PGY-1, [place] Intern pager: [phone], text pages welcome

## 2013-05-16 NOTE — Progress Notes (Signed)
Pt refused Zyprexia 10mg  IM. States she will only take Haldol 5mg  tablet daily.

## 2013-05-16 NOTE — Progress Notes (Signed)
[facility] Progress Note  Subjective:   Feels good. Anxious to go home. No emerging c/o's. Sitter present.  Objective Filed Vitals:   05/15/13 1708 05/15/13 2100 05/16/13 0436 05/16/13 0808  BP: 147/98 149/93 138/93 119/87  Pulse: 64 79 73 94  Temp: 98.8 F (37.1 C) 98.7 F (37.1 C) 98.4 F (36.9 C) 97.3 F (36.3 C)  TempSrc: Oral Oral Oral Oral  Resp: 20 18 18 20   Height:      Weight:  64.411 kg (142 lb)    SpO2: 99% 97% 98% 98%   Physical Exam General: alert, cooperative, NAD Heart: RRR, no mrg appreciated Lungs: Diminished, crackles rt base. No wheezes or rhonchi Abdomen: Soft, NT, + BS Extremities: Trace le edema Dialysis Access: R I-J/ Maturing R AVG + bruit  Dialysis Orders: Center: [place] on TTS .  EDW 65.5 HD Bath 2K/2CA Time 4:00 hrs Heparin 6000. Access R I-J, Maturing R AVF BFR 400 DFR 800  Hector al 1 mcg IV/HD Epogen 20000 Units IV/HD Venofer 50mg  weekly    Assessment/Plan: 1. Psychosis/ Hx bipolar, schizophrenia - Mgmt per admit 2. HD non adherence - related to #1 abot=ve 3. ESRD - TTS, K+ 4.8 Next HD tomorrow 4. Anemia - Hgb 10.2 trending down here. On high dose ESA as op so will add Aranesp 25 for maintenance. Continue wkly op 5. Secondary hyperparathyroidism - Corr Ca 9.9. Phos up. Increase Phoslo to op dose of 3 ac. Cont Vit D and low Ca bath 6. HTN/volume -  SBPs 110s on amlodipine. Still with crackles and trace LE edema. Needs EDW decr at d/c 7. Nutrition - Renal diet, multivitamin  Collene Leyden. Cletus Gash, PA-C [facility] Pager [phone] 05/16/2013,8:51 AM  LOS: 3 days   I have seen and examined patient, discussed with PA and agree with assessment and plan as outlined above. Kelly Splinter MD pager [phone]    cell [phone] 05/16/2013, 12:48 PM    Additional Objective Labs: Basic Metabolic Panel:  Recent Labs Lab 05/14/13 0716 05/15/13 0641 05/16/13 0702  NA 141 142 141  K 5.1 4.3 4.8  CL 105 101 102  CO2 17*  24 22  GLUCOSE 62* 79 75  BUN 62* 23 32*  CREATININE 16.42* 9.19* 10.80*  CALCIUM 9.1 9.8 9.6  PHOS 7.4* 6.1* 6.4*   Liver Function Tests:  Recent Labs Lab 05/13/13 1138 05/14/13 0716 05/15/13 0641 05/16/13 0702  AST 17  --   --   --   ALT 8  --   --   --   ALKPHOS 50  --   --   --   BILITOT 0.4  --   --   --   PROT 8.0  --   --   --   ALBUMIN 4.3 3.5 3.7 3.6   CBC:  Recent Labs Lab 05/13/13 1138 05/14/13 0716  WBC 4.1 3.1*  NEUTROABS 2.5  --   HGB 10.9* 10.2*  HCT 34.2* 31.7*  MCV 88.4 88.1  PLT 146* 192   Blood Culture    Component Value Date/Time   SDES URINE, CATHETERIZED 05/12/2012 2152   SPECREQUEST NONE 05/12/2012 2152   CULT NO GROWTH 05/12/2012 2152   REPTSTATUS 05/13/2012 FINAL 05/12/2012 2152    Studies/Results: No results found.  Medications:   . amLODipine  5 mg Oral Daily  . benztropine  1 mg Oral BID  . calcium acetate  2,001 mg Oral TID WC  . [START ON 05/17/2013] darbepoetin (ARANESP) injection -  DIALYSIS  25 mcg Intravenous Q Tue-HD  . divalproex  500 mg Oral Q12H  . [START ON 05/17/2013] doxercalciferol  1 mcg Intravenous Q T,Th,Sa-HD  . [START ON 05/17/2013] ferric gluconate (FERRLECIT/NULECIT) IV  62.5 mg Intravenous Weekly  . haloperidol  5 mg Oral BID  . LORazepam  2 mg Intramuscular Once  . multivitamin  1 tablet Oral QHS  . nicotine  21 mg Transdermal Daily

## 2013-05-16 NOTE — Progress Notes (Signed)
Pt refused Cogentin 1mg  tablet, Depakote 500mg  tablet, and Nicotine patch. Tried offering pt education on importance of medication compliance but pt continued to refuse. Medications placed back in pt's medication drawer.

## 2013-05-16 NOTE — Clinical Social Work Psych Note (Addendum)
1:38pm- Psych CSW received [place] Authorization: [id number] for [facility].  Pt information has been sent to [facility] for review for possible inpatient psychiatric admission.  Pt is currently Barrister's clerk (IVC'd).    1:04pm- Psych CSW referred pt to: [facility] [phone] - unable to accept HD patients Mikel Cella [phone] - awaiting a return call from [facility] in regards to hd needs Rosana Hoes [phone] - unable to accept HD patients [facility] [phone] - no beds no expected dc [facility] [phone] - unable to accept HD patients [facility] [phone] - no beds available and no expected dc  Psych CSW spoke with Randall Hiss at [facility] in regards to referral.  Randall Hiss will review.  [facility] has no bed availability today.  10:50am- Psych CSW reviewed chart.  Psychiatry evaluated pt on 05/14/2013 and recommended inpatient psychiatric treatment for crisis stabilization.  Pt has been referred to [facility].  Psych CSW will continue to follow for psych placement.    Nonnie Done, [place] [phone]  Clinical Social Work

## 2013-05-16 NOTE — Progress Notes (Signed)
Family Medicine Teaching Service Attending Note  I interviewed and examined [name] and reviewed their tests and x-rays.  I discussed with Dr. Lamar [name] and reviewed their note for today.  I agree with their assessment and plan.     Additionally  Alert rapid speech   ESRD - as per renal Psychosis - apparently improved pending psych reevalauation.   Patient has history of lupus nephritis by bx but negative DSDNA.  Unlikely but possible CNS involvement if has lupus.  Would get MRI if patient is cooperative

## 2013-05-17 ENCOUNTER — Inpatient Hospital Stay (HOSPITAL_COMMUNITY): Payer: PRIVATE HEALTH INSURANCE

## 2013-05-17 DIAGNOSIS — F29 Unspecified psychosis not due to a substance or known physiological condition: Secondary | ICD-10-CM | POA: Diagnosis not present

## 2013-05-17 DIAGNOSIS — F209 Schizophrenia, unspecified: Secondary | ICD-10-CM | POA: Diagnosis not present

## 2013-05-17 LAB — RENAL FUNCTION PANEL
Albumin: 3.6 g/dL (ref 3.5–5.2)
BUN: 44 mg/dL — ABNORMAL HIGH (ref 6–23)
CALCIUM: 9.8 mg/dL (ref 8.4–10.5)
CO2: 20 meq/L (ref 19–32)
Chloride: 102 mEq/L (ref 96–112)
Creatinine, Ser: 11.46 mg/dL — ABNORMAL HIGH (ref 0.50–1.10)
GFR calc non Af Amer: 4 mL/min — ABNORMAL LOW (ref >90)
GFR, EST AFRICAN AMERICAN: 5 mL/min — AB (ref >90)
GLUCOSE: 156 mg/dL — AB (ref 70–99)
Phosphorus: 5.4 mg/dL — ABNORMAL HIGH (ref 2.3–4.6)
Potassium: 4.3 mEq/L (ref 3.7–5.3)
SODIUM: 140 meq/L (ref 137–147)

## 2013-05-17 MED ORDER — HALOPERIDOL LACTATE 5 MG/ML IJ SOLN: 5.0000 mg | Freq: Once | INTRAMUSCULAR | Status: AC | PRN

## 2013-05-17 MED ORDER — HALOPERIDOL LACTATE 5 MG/ML IJ SOLN
5.0000 mg | Freq: Once | INTRAMUSCULAR | Status: DC
Start: 2013-05-17 — End: 2013-05-17

## 2013-05-17 MED ORDER — DOXERCALCIFEROL 4 MCG/2ML IV SOLN
INTRAVENOUS | Status: AC
  Administered 2013-05-17: 1 ug via INTRAVENOUS
  Filled 2013-05-17: qty 2

## 2013-05-17 MED ORDER — DARBEPOETIN ALFA-POLYSORBATE 25 MCG/0.42ML IJ SOLN
INTRAMUSCULAR | Status: AC
  Administered 2013-05-17: 25 ug via INTRAVENOUS
  Filled 2013-05-17: qty 0.42

## 2013-05-17 NOTE — Progress Notes (Signed)
Family Medicine Teaching Service Attending Note  I interviewed and examined [name] and reviewed their tests and x-rays.  I discussed with Dr. Lamar Benes and reviewed their note for today.  I agree with their assessment and plan.     Additionally  Alert cooperative this AM..Appropriately expresses frustration for not being able to leave Await psychiatry consultation She denies any current homicidal or suicidal ideation and appears competent

## 2013-05-17 NOTE — Procedures (Signed)
I was present at this dialysis session, have reviewed the session itself and made  appropriate changes  Kelly Splinter MD (pgr) [phone]    [phone] 05/17/2013, 2:43 PM

## 2013-05-17 NOTE — Progress Notes (Signed)
Family Medicine Teaching Service Daily Progress Note Intern Pager: [phone]  Patient name: Sara [name] Medical record number: [id number] Date of birth: Mar 29, 1981 Age: 33 y.o. Gender: female  Primary Care Provider: Tommi Rumps, MD Consultants: Psychiatry Code Status: Full  Pt Overview and Major Events to Date:  2/7 - admitted, awaiting formal psych eval, HD by nephro 2/8 - Pt refusing depakote 2/9 - Pt refuses all meds except haldol po x1 2/10 - Psych re-eval, MRI ordered, HD  Assessment and Plan: Sara [name] is a 33 y.o. female presenting with florid psychosis in the ED, more consistent with acute mania once up to the floor. 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 Pt presented to the ED and was involuntarily committed by [place] [employer]; telepsych recommended inpt placement in the ED and expected placement at [facility] was not completed (bed not available) and pt thus admitted due to needing HD but appears medically stable for psych placement if needed.   # Schizophrenia with acute psychosis / mania -  - no appear to have acute infection, salicylates / Tylenol / EtOH negative; urine pregnancy, UA, and UDS ordered by the ED with results pending.  - ordered for home dose of Haldol 5 mg PO BID, has received 1x a day past 3 days  - social work also aware of pt, appreciate assistance with placement (difficult for inpt bed with HD) - appreciate psych consult, called last night to re-eval today with recommendations for placement / medication management. First consult recommended depakote 500mg  BID, however patient has refused to take so far. She has been very cooperative during this hospital stay, asking psych to re-eval to see if she still meets criteria for involuntary commitment.  - per psych phone consult yesterday, recommended zyprexa 10mg  IM x 1 followed by 7.5mg  po or IV BID PRN. Pt refused again.  # Lupus: there is some question of  given lupus nephritis whether she may also have lesions from lupus in her brain. Discussed with her yesterday about doing an MRI, and she does seem willing however she wants to do this as an outpatient and refused having it ordered to be done here yesterday.  - placed order for MRI brain, spoke with MRI on the phone and they are aware she has HD from 12-4, they will try to get this done before then.  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 Justin Mend and associates  - renal function daily to monitor electrolytes  - renal diet, renal vitamin, and Vit.D / Phoslo per renal recs   # HTN - intermittently elevated, likely secondary to acute mania / agitation, ?noncompliance with home meds  - ordered for amlodipine 5 mg daily (home med)  - Start hydralazine with parameters  # Anemia of chronic disease - likely secondary to CKD, Hb stable; ESA and iron on HD  FEN/GI: saline lock IV  Prophylaxis: SCD's  Disposition: Continue Tele, awaiting psych re-eval  Subjective:  Appears in better mood this morning, she still says she is frustrated with how long she has been here but does not appear agitated when she talks about this. No other complaints this morning. She would like to try and get the MRI done today, but if it can't be done before discharge/transfer she will not wait  for it. She has HD from 12-4.  Objective: Temp:  [97.3 F (36.3 C)-97.9 F (36.6 C)] 97.9 F (36.6 C) (02/10 0428) Pulse Rate:  [58-103] 73 (02/10 0428) Resp:  [14-22] 22 (02/10 0428) BP: (119-137)/(86-95) 131/86 mmHg (02/10 0428) SpO2:  [98 %-100 %] 100 % (02/10 0428) Weight:  [139 lb (63.05 kg)] 139 lb (63.05 kg) (02/09 2023) Physical Exam: Gen: NAD, alert, cooperative with exam HEENT: NCAT CV: RRR, good S1/S2, no murmur Resp: CTABL, no w/r/c Abd: Soft, NTND, BS present,  no guarding or organomegaly Ext: No edema, warm Neuro: Alert and oriented x4, No gross deficits Psych: mood is "good", affect the same. Much more calm this morning, denies SI/HI, denies a/v hallucinations   Laboratory:  Recent Labs Lab 05/13/13 1138 05/14/13 0716  WBC 4.1 3.1*  HGB 10.9* 10.2*  HCT 34.2* 31.7*  PLT 146* 192    Recent Labs Lab 05/13/13 1138 05/14/13 0716 05/15/13 0641 05/16/13 0702  NA 145 141 142 141  K 5.0 5.1 4.3 4.8  CL 107 105 101 102  CO2 16* 17* 24 22  BUN 61* 62* 23 32*  CREATININE 16.03* 16.42* 9.19* 10.80*  CALCIUM 9.9 9.1 9.8 9.6  PROT 8.0  --   --   --   BILITOT 0.4  --   --   --   ALKPHOS 50  --   --   --   ALT 8  --   --   --   AST 17  --   --   --   GLUCOSE 75 62* 79 75   Salicylate and tylenol WNL EtOH- <11   Imaging/Diagnostic Tests: CXR 05/13/2013 IMPRESSION:  Right jugular catheter tips in the superior vena cava. No  pneumothorax.   Tawanna Sat, MD 05/17/2013, 7:26 AM PGY-1, [place] Intern pager: [phone], text pages welcome

## 2013-05-17 NOTE — Discharge Instructions (Addendum)
You were admitted under Involuntary Commitment (IVC). You were evaluated by Psych and they thought it was necessary for you to be hospitalized on the inpatient psychiatric ward for stabilization. Psych recommended several medication changes which you refused to take. We also did an MRI of your brain to evaluate if your Lupus was affecting your brain, the result of this imaging test was normal. On psychiatry's re-evaluation, they decided that you did not need to be involuntarily committed anymore and were okay to go home.

## 2013-05-17 NOTE — Consult Note (Signed)
Patient would benefit from being switched from Haldol to Zyprexa to help stabilize her psychosis and also her mood.

## 2013-05-17 NOTE — Progress Notes (Signed)
Patient became agitated and requested the psych doctor. Family medicine notified and stated they would come speak with patient about the plan. Patient is increasingly becoming more irritated. Will continue to offer support.

## 2013-05-17 NOTE — Progress Notes (Signed)
PCP note  The patient was sleeping in dialysis when I went to see her. It sounds like there is some question of whether she is appropriate for involuntary commitment at this time. Agree with psych re-eval, though it may be hard to place her back to an HD center if she is not cooperating with recommended psych medications. I appreciate the care provided by the inpatient team. I will be happy to see the patient in follow-up once she is discharged from the hospital.  Tommi Rumps, MD [place] PGY-2

## 2013-05-17 NOTE — Progress Notes (Signed)
Family Practice Teaching Service Interval Progress Note  FPTS received page from unit [place] that pt was again agitated, threatening to become homicidal, and that hospital security and [place] were present outside of pt's room. Both [place] [employer] and Hospital security requested a physician come up to speak with them. Myself and Dr. Parks Ranger (PGY-1) went to pt's bedside to speak with her. She was very argumentative with pressured speech. She expressed her frustration at being "held against her will" for 6 days, and about the continued delay in psychiatry seeing her. I explained that we cannot control when psychiatry comes to see her but that we will do our best to contact them again and request that they see her. Pt vehemently requested that her IV be removed, as it had been in place "for 6 days" and was not being used. She became more irate when I mentioned that we would be giving her medicine to calm her down this evening, stating that she did not need it, she just needed her IV out if she was going to have to stay until the morning again.  After reviewing pt's chart, it appears as though she is not receiving any IV medications or fluids through this peripheral IV. In an effort to pacify patient without administering additional medications, I authorized removal of her PIV. Will plan to see if pt is able to remain calm this evening with IV removed. In the event that she becomes agitated again, we have ordered a one time prn dose of haldol 5mg  IM. I have asked nursing staff to let us know if she requires this medication.  As it is now 2/10 and pt has not been seen by psychiatry since 2/7, and is involuntarily committed, I have also called psychiatry consult service again to request that they come see pt as soon as possible.  I updated nursing staff, hospital security, and [facility] Dept of current plan. [employer] reportedly working on finding an Garment/textile technologist to sit with pt around the clock due  to security concerns.  Chrisandra Netters, MD Family Medicine PGY-2 Service Pager [phone]

## 2013-05-17 NOTE — Progress Notes (Signed)
Family Practice Teaching Service Interval Progress Note  Paged by Tora Kindred RN to discuss current plan and disposition for Mrs. Sara [name]. On arrival to patient's room to discuss plans in person, patient was laying in bed with the lights off and [place] sitter at bedside. After introducing myself the patient initially questioned if I was with Psychiatry, as she was expecting to speak with one of the psychiatrists for re-evaluation today. Upon responding to her that I was not with psychiatry, but part of the [facility] Medicine Team, she became displeased. Initially I simply explained our plan that due to her initial IVC on admission and current IVC status, she has remained hospitalized until Psychiatry is able arrange for inpatient psychiatric admission (requiring bed and acceptance) or re-evaluation and determination that she would be "cleared of the IVC" and continue with regular medical treatment plan prior to discharge. She then proceeded to adamantly explain that she "felt like she was kidnapped" and that we "keep sending people to lie to her" and "tell her that she will be re-evaluated tomorrow" and she expressed extreme frustration and agitation that arrangements have not be completed at this time. She stressed and emphatically outlined her current life stressors, primarily financial and that she is unable to keep her job or keep any previously scheduled appointments, because she is "kidnapped in the hospital". She did not want to her any more from me, and was not accepting any information that I had to offer regarding our attempts to make further arrangements for her re-evaluation and potential discharge. She said that I was "dismissed" and then threatened a "lawsuit against all of [place]" and she took out her cell phone to "call [phone], to tell the police she was kidnapped".  Sara Putnam, DO Family Medicine PGY-1 Service Pager [phone]

## 2013-05-17 NOTE — Progress Notes (Signed)
[facility] Progress Note  Subjective:   Ready to go home. Frustrated but appropriate and cooperative. Sitter present.  Objective Filed Vitals:   05/16/13 1729 05/16/13 2023 05/17/13 0428 05/17/13 0831  BP: 137/94 136/95 131/86 135/97  Pulse: 103 58 73 105  Temp: 97.3 F (36.3 C) 97.5 F (36.4 C) 97.9 F (36.6 C) 98.3 F (36.8 C)  TempSrc: Oral Oral Oral Oral  Resp: 16 14 22 18   Height:      Weight:  63.05 kg (139 lb)    SpO2: 100% 100% 100% 100%   Physical Exam General: Alert, cooperative, NAD Heart: RRR Lungs: Diminished at bases. No wheezes or rhonchi Abdomen: Soft, NT, + BS Extremities: Trace LE edema Dialysis Access: R I-J TDC, Maturing R AVG + bruit  Dialysis Orders: Center: [facility] on TTS .  EDW 65.5 HD Bath 2K/2CA Time 4:00 hrs Heparin 6000. Access R I-J, Maturing R AVF BFR 400 DFR 800  Hector al 1 mcg IV/HD Epogen 20000 Units IV/HD Venofer 50mg  weekly   Assessment/Plan: 1. Psychosis/ Hx bipolar, schizophrenia - Mgmt per primary. Still refusing antipsychotic meds with exception of haldol. Awaiting re-eval by psych. Difficult finding placement for HD patient. No beds at [facility]. 2. Lupus - Per primary, given lupus nephritis, may possibly have lupus lesion in the brain. MRI pending 3. HD non adherence - related to #1 above  4. ESRD - TTS, K+ 4.8  HD today. 5. Anemia - Hgb 10.2 trending down here.  Aranesp 25 q Tues for maintenance. Continue wkly op Fe 6. Secondary hyperparathyroidism - Corr Ca 9.9. Phos up. Increase Phoslo to op dose of 3 ac. Cont Vit D and low Ca bath  7. HTN/volume - SBPs 130s on amlodipine 5mg . Needs EDW decr at d/c. Eval post wgt today  8. Nutrition - Renal diet, multivitamin  Collene Leyden. Cletus Gash, PA-C [facility] Pager [phone] 05/17/2013,10:39 AM  LOS: 4 days   I have seen and examined patient, discussed with PA and agree with assessment and plan as outlined above. Kelly Splinter MD pager [phone]    cell  [phone] 05/17/2013, 12:22 PM    Additional Objective Labs: Basic Metabolic Panel:  Recent Labs Lab 05/14/13 0716 05/15/13 0641 05/16/13 0702  NA 141 142 141  K 5.1 4.3 4.8  CL 105 101 102  CO2 17* 24 22  GLUCOSE 62* 79 75  BUN 62* 23 32*  CREATININE 16.42* 9.19* 10.80*  CALCIUM 9.1 9.8 9.6  PHOS 7.4* 6.1* 6.4*   Liver Function Tests:  Recent Labs Lab 05/13/13 1138 05/14/13 0716 05/15/13 0641 05/16/13 0702  AST 17  --   --   --   ALT 8  --   --   --   ALKPHOS 50  --   --   --   BILITOT 0.4  --   --   --   PROT 8.0  --   --   --   ALBUMIN 4.3 3.5 3.7 3.6    CBC:  Recent Labs Lab 05/13/13 1138 05/14/13 0716  WBC 4.1 3.1*  NEUTROABS 2.5  --   HGB 10.9* 10.2*  HCT 34.2* 31.7*  MCV 88.4 88.1  PLT 146* 192   Blood Culture    Component Value Date/Time   SDES URINE, CATHETERIZED 05/12/2012 2152   SPECREQUEST NONE 05/12/2012 2152   CULT NO GROWTH 05/12/2012 2152   REPTSTATUS 05/13/2012 FINAL 05/12/2012 2152    Studies/Results: No results found.  Medications:   . amLODipine  5 mg Oral Daily  . benztropine  1 mg Oral BID  . calcium acetate  2,001 mg Oral TID WC  . darbepoetin (ARANESP) injection - DIALYSIS  25 mcg Intravenous Q Tue-HD  . divalproex  500 mg Oral Q12H  . doxercalciferol  1 mcg Intravenous Q T,Th,Sa-HD  . ferric gluconate (FERRLECIT/NULECIT) IV  62.5 mg Intravenous Weekly  . haloperidol  5 mg Oral BID  . LORazepam  2 mg Intramuscular Once  . multivitamin  1 tablet Oral QHS  . nicotine  21 mg Transdermal Daily  . OLANZapine  10 mg Intramuscular Once

## 2013-05-18 LAB — RENAL FUNCTION PANEL
Albumin: 3.6 g/dL (ref 3.5–5.2)
BUN: 20 mg/dL (ref 6–23)
CALCIUM: 9.3 mg/dL (ref 8.4–10.5)
CHLORIDE: 100 meq/L (ref 96–112)
CO2: 24 meq/L (ref 19–32)
CREATININE: 7.25 mg/dL — AB (ref 0.50–1.10)
GFR calc Af Amer: 8 mL/min — ABNORMAL LOW (ref >90)
GFR, EST NON AFRICAN AMERICAN: 7 mL/min — AB (ref >90)
Glucose, Bld: 79 mg/dL (ref 70–99)
Phosphorus: 5 mg/dL — ABNORMAL HIGH (ref 2.3–4.6)
Potassium: 3.8 mEq/L (ref 3.7–5.3)
Sodium: 141 mEq/L (ref 137–147)
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
## 2013-05-18 NOTE — Progress Notes (Signed)
Family Medicine Teaching Service Attending Note  I discussed [name]  with Dr. Lamar Benes and reviewed their note for today.  I agree with their assessment and plan.

## 2013-05-18 NOTE — Progress Notes (Signed)
Pt escorted off unit accompanied by this RN. Belongings picked up from security and from Nursing station. Will continue to monitor.
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
## 2013-05-18 NOTE — Progress Notes (Signed)
[facility] Progress Note  Subjective:   No complaints  Objective Filed Vitals:   05/17/13 2042 05/18/13 0459 05/18/13 0805 05/18/13 1201  BP: 121/88 119/82 120/75 117/74  Pulse: 101 97 75 86  Temp: 97.9 F (36.6 C) 98.4 F (36.9 C) 97.6 F (36.4 C) 97.4 F (36.3 C)  TempSrc: Oral Oral Oral Oral  Resp: 18 17 18 18   Height:      Weight: 61.689 kg (136 lb)     SpO2: 100% 100% 99% 99%   Physical Exam General: Alert, cooperative, NAD Heart: RRR Lungs: Diminished at bases. No wheezes or rhonchi Abdomen: Soft, NT, + BS Extremities: no LE edema Dialysis Access: R I-J TDC, Maturing R AVG + bruit  Dialysis: TTS at [facility]  4h   65.5kg   2K/2.0Bath   Heparin 6000   R IJ cath (maturing R AVF) Hectorol 1 mcg    Epogen 20K   Venofer 50mg  weekly   Assessment/Plan: 1. Psychosis/ Hx bipolar, schizophrenia - per primary and psychiatry. 2. 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 HD non adherence - related to #1 above  4. ESRD - TTS, K+ 4.8  HD today. 5. Anemia - Hgb 10.2 trending down here.  Aranesp 25 q Tues for maintenance. Continue wkly op Fe 6. Secondary hyperparathyroidism - Corr Ca 9.9. Phos up. Increased Phoslo to op dose of 3 ac. Cont Vit D and low Ca bath  7. HTN/volume - SBPs 120s on amlodipine 5mg . Down 4kg from prior dry wt.  Make new dry wt 61.5kg.  8. Nutrition - Renal diet, multivitamin  Kelly Splinter MD pager [phone]    cell [phone] 05/18/2013, 12:56 PM    Additional Objective Labs: Basic Metabolic Panel:  Recent Labs Lab 05/16/13 0702 05/17/13 1248 05/18/13 0332  NA 141 140 141  K 4.8 4.3 3.8  CL 102 102 100  CO2 22 20 24   GLUCOSE 75 156* 79  BUN 32* 44* 20  CREATININE 10.80* 11.46* 7.25*  CALCIUM 9.6 9.8 9.3  PHOS 6.4* 5.4* 5.0*   Liver Function Tests:  Recent Labs Lab 05/13/13 1138  05/16/13 0702 05/17/13 1248 05/18/13 0332  AST 17  --   --   --   --   ALT 8  --   --   --   --   ALKPHOS 50  --   --   --   --    BILITOT 0.4  --   --   --   --   PROT 8.0  --   --   --   --   ALBUMIN 4.3  < > 3.6 3.6 3.6  < > = values in this interval not displayed.  CBC:  Recent Labs Lab 05/13/13 1138 05/14/13 0716  WBC 4.1 3.1*  NEUTROABS 2.5  --   HGB 10.9* 10.2*  HCT 34.2* 31.7*  MCV 88.4 88.1  PLT 146* 192   Blood Culture    Component Value Date/Time   SDES URINE, CATHETERIZED 05/12/2012 2152   SPECREQUEST NONE 05/12/2012 2152   CULT NO GROWTH 05/12/2012 2152   REPTSTATUS 05/13/2012 FINAL 05/12/2012 2152    Studies/Results: Mr Brain Wo Contrast  05/17/2013   CLINICAL DATA:  33 year old with psychosis. History of schizophrenia, high blood pressure and lupus with stage 4 kidney disease on hemodialysis.  EXAM: MRI HEAD WITHOUT CONTRAST  TECHNIQUE: Multiplanar, multiecho pulse sequences of the brain and surrounding structures were obtained without intravenous contrast.  COMPARISON:  12/10/2012 head CT.  No comparison brain MR.  FINDINGS: No acute infarct.  No intracranial hemorrhage.  No intracranial mass lesion noted on this unenhanced exam.  No hydrocephalus  Major intracranial vascular structures are patent with congenitally appearing small right vertebral artery.  Slight decreased signal intensity of bone marrow may be related to anemia from renal disease.  Cervical medullary junction, pituitary region and pineal region unremarkable.  Exophthalmos  Paranasal sinus mucosal thickening most notable inferior aspect right maxillary sinus.  Prominent soft tissue posterior superior nasopharynx may represent adenoidal tissue with tiny of right-sided Thornwaldt cyst. At the level of the right fossa of Rosenmuller, there is a 6 x 12 mm cystic structure. Presently, no findings to suggest presence of eustachian tube dysfunction.  IMPRESSION: No acute infarct.  No intracranial mass lesion noted on this unenhanced exam.  Slight decreased signal intensity of bone marrow may be related to anemia from renal disease.  Exophthalmos   Paranasal sinus mucosal thickening most notable inferior aspect right maxillary sinus.  Prominent soft tissue posterior superior nasopharynx may represent adenoidal tissue with tiny of right-sided Thornwaldt cyst. At the level of the right fossa of Rosenmuller, there is a 6 x 12 mm cystic structure. Presently, no findings to suggest presence of eustachian tube dysfunction.   Electronically Signed   By: Chauncey Cruel M.D.   On: 05/17/2013 13:22    Medications:   . amLODipine  5 mg Oral Daily  . benztropine  1 mg Oral BID  . calcium acetate  2,001 mg Oral TID WC  . darbepoetin (ARANESP) injection - DIALYSIS  25 mcg Intravenous Q Tue-HD  . divalproex  500 mg Oral Q12H  . doxercalciferol  1 mcg Intravenous Q T,Th,Sa-HD  . ferric gluconate (FERRLECIT/NULECIT) IV  62.5 mg Intravenous Weekly  . haloperidol  5 mg Oral BID  . LORazepam  2 mg Intramuscular Once  . multivitamin  1 tablet Oral QHS  . nicotine  21 mg Transdermal Daily  . OLANZapine  10 mg Intramuscular Once

## 2013-05-19 NOTE — Discharge Summary (Signed)
I have reviewed this discharge summary and agree.    

## 2013-05-19 NOTE — Clinical Social Work Psych Note (Signed)
Late entry:  Psych CSW rescinded IVC paperwork per psychiatrist.   Nonnie Done, [place] [phone]  Clinical Social Work

## 2013-05-29 ENCOUNTER — Emergency Department (HOSPITAL_COMMUNITY)
Admission: EM | Admit: 2013-05-29 | Discharge: 2013-05-29 | Discharge status: Against Medical Advice | Payer: PRIVATE HEALTH INSURANCE | Attending: Emergency Medicine | Admitting: Emergency Medicine

## 2013-05-29 ENCOUNTER — Encounter (HOSPITAL_COMMUNITY): Payer: Self-pay | Admitting: Emergency Medicine

## 2013-05-29 DIAGNOSIS — I120 Hypertensive chronic kidney disease with stage 5 chronic kidney disease or end stage renal disease: Secondary | ICD-10-CM | POA: Insufficient documentation

## 2013-05-29 DIAGNOSIS — N186 End stage renal disease: Secondary | ICD-10-CM | POA: Insufficient documentation

## 2013-05-29 DIAGNOSIS — Z5321 Procedure and treatment not carried out due to patient leaving prior to being seen by health care provider: Secondary | ICD-10-CM

## 2013-05-29 DIAGNOSIS — Z5320 Procedure and treatment not carried out because of patient's decision for unspecified reasons: Secondary | ICD-10-CM | POA: Insufficient documentation

## 2013-05-29 DIAGNOSIS — F17200 Nicotine dependence, unspecified, uncomplicated: Secondary | ICD-10-CM | POA: Insufficient documentation

## 2013-05-29 DIAGNOSIS — Y832 Surgical operation with anastomosis, bypass or graft as the cause of abnormal reaction of the patient, or of later complication, without mention of misadventure at the time of the procedure: Secondary | ICD-10-CM | POA: Insufficient documentation

## 2013-05-29 NOTE — ED Notes (Signed)
Pt is of dialysis t,th,sa. Pt missed her Saturday dialysis day and requesting dialysis today. Pt also reports that she has taken in a lot of dairy products recently. Pt has no other complaints.

## 2013-05-29 NOTE — ED Provider Notes (Signed)
10:49 AM Left without being seen or evaluated by me.   Clinical Impression 1. Patient left without being seen      Blanchard Kelch, MD 05/29/13 1050

## 2013-05-29 NOTE — ED Notes (Signed)
Pt. Stated, I don't want to stay, I thought I could go straight to Dialysis.  I will see my dr. Marylene Buerger.

## 2013-05-31 ENCOUNTER — Encounter: Payer: Self-pay | Admitting: Vascular Surgery

## 2013-06-01 ENCOUNTER — Encounter: Payer: Self-pay | Admitting: Vascular Surgery

## 2013-06-01 ENCOUNTER — Other Ambulatory Visit (HOSPITAL_COMMUNITY): Payer: Self-pay

## 2013-06-02 ENCOUNTER — Ambulatory Visit: Payer: Self-pay | Admitting: Obstetrics & Gynecology

## 2013-06-03 ENCOUNTER — Other Ambulatory Visit (HOSPITAL_COMMUNITY): Payer: Self-pay

## 2013-06-05 ENCOUNTER — Other Ambulatory Visit (HOSPITAL_COMMUNITY): Payer: Self-pay | Admitting: Family Medicine

## 2013-06-06 ENCOUNTER — Inpatient Hospital Stay (HOSPITAL_COMMUNITY): Admission: RE | Admit: 2013-06-06 | Payer: Self-pay | Source: Ambulatory Visit

## 2013-06-06 NOTE — Telephone Encounter (Signed)
LM for pt informing her that rx was sent into pharmacy.  If she calls back please relay message that she should get her renal disease medications refilled with her nephrologist. Sara [name]

## 2013-06-06 NOTE — Telephone Encounter (Signed)
Refilled medication for one month. Patient should be advised to get refills for ESRD related medications from the nephrologist in the future.

## 2013-06-13 ENCOUNTER — Other Ambulatory Visit: Payer: Self-pay

## 2013-06-13 ENCOUNTER — Emergency Department (HOSPITAL_COMMUNITY)
Admission: EM | Admit: 2013-06-13 | Discharge: 2013-06-14 | Disposition: A | Discharge status: Other Acute Inpatient Hospital | Payer: PRIVATE HEALTH INSURANCE | Attending: Emergency Medicine | Admitting: Emergency Medicine

## 2013-06-13 ENCOUNTER — Encounter (HOSPITAL_COMMUNITY): Payer: Self-pay | Admitting: Emergency Medicine

## 2013-06-13 DIAGNOSIS — N059 Unspecified nephritic syndrome with unspecified morphologic changes: Secondary | ICD-10-CM | POA: Insufficient documentation

## 2013-06-13 DIAGNOSIS — I10 Essential (primary) hypertension: Secondary | ICD-10-CM | POA: Insufficient documentation

## 2013-06-13 DIAGNOSIS — M329 Systemic lupus erythematosus, unspecified: Secondary | ICD-10-CM | POA: Insufficient documentation

## 2013-06-13 DIAGNOSIS — F29 Unspecified psychosis not due to a substance or known physiological condition: Secondary | ICD-10-CM | POA: Insufficient documentation

## 2013-06-13 DIAGNOSIS — R45850 Homicidal ideations: Secondary | ICD-10-CM | POA: Insufficient documentation

## 2013-06-13 DIAGNOSIS — Z992 Dependence on renal dialysis: Secondary | ICD-10-CM | POA: Insufficient documentation

## 2013-06-13 DIAGNOSIS — F1910 Other psychoactive substance abuse, uncomplicated: Secondary | ICD-10-CM | POA: Insufficient documentation

## 2013-06-13 DIAGNOSIS — E875 Hyperkalemia: Secondary | ICD-10-CM | POA: Insufficient documentation

## 2013-06-13 DIAGNOSIS — F209 Schizophrenia, unspecified: Secondary | ICD-10-CM

## 2013-06-13 DIAGNOSIS — F319 Bipolar disorder, unspecified: Secondary | ICD-10-CM | POA: Insufficient documentation

## 2013-06-13 DIAGNOSIS — F2089 Other schizophrenia: Secondary | ICD-10-CM | POA: Insufficient documentation

## 2013-06-13 DIAGNOSIS — F17200 Nicotine dependence, unspecified, uncomplicated: Secondary | ICD-10-CM | POA: Insufficient documentation

## 2013-06-13 DIAGNOSIS — Z79899 Other long term (current) drug therapy: Secondary | ICD-10-CM | POA: Insufficient documentation

## 2013-06-13 LAB — RAPID URINE DRUG SCREEN, HOSP PERFORMED
AMPHETAMINES: NOT DETECTED
BARBITURATES: NOT DETECTED
BENZODIAZEPINES: NOT DETECTED
COCAINE: POSITIVE — AB
Opiates: NOT DETECTED
TETRAHYDROCANNABINOL: NOT DETECTED

## 2013-06-13 LAB — COMPREHENSIVE METABOLIC PANEL
ALBUMIN: 4.3 g/dL (ref 3.5–5.2)
ALT: 17 U/L (ref 0–35)
AST: 23 U/L (ref 0–37)
Alkaline Phosphatase: 57 U/L (ref 39–117)
BUN: 84 mg/dL — ABNORMAL HIGH (ref 6–23)
CALCIUM: 10 mg/dL (ref 8.4–10.5)
CO2: 10 mEq/L — CL (ref 19–32)
CREATININE: 17.31 mg/dL — AB (ref 0.50–1.10)
Chloride: 106 mEq/L (ref 96–112)
GFR calc Af Amer: 3 mL/min — ABNORMAL LOW (ref >90)
GFR calc non Af Amer: 2 mL/min — ABNORMAL LOW (ref >90)
Glucose, Bld: 86 mg/dL (ref 70–99)
Potassium: 5.9 mEq/L — ABNORMAL HIGH (ref 3.7–5.3)
Sodium: 141 mEq/L (ref 137–147)
Total Bilirubin: 0.3 mg/dL (ref 0.3–1.2)
Total Protein: 7.9 g/dL (ref 6.0–8.3)

## 2013-06-13 LAB — CBC WITH DIFFERENTIAL/PLATELET
Basophils Absolute: 0.1 10*3/uL (ref 0.0–0.1)
Basophils Relative: 1 % (ref 0–1)
EOS ABS: 0.1 10*3/uL (ref 0.0–0.7)
Eosinophils Relative: 1 % (ref 0–5)
HCT: 30.5 % — ABNORMAL LOW (ref 36.0–46.0)
Hemoglobin: 10.1 g/dL — ABNORMAL LOW (ref 12.0–15.0)
Lymphocytes Relative: 27 % (ref 12–46)
Lymphs Abs: 1.4 10*3/uL (ref 0.7–4.0)
MCH: 28.1 pg (ref 26.0–34.0)
MCHC: 33.1 g/dL (ref 30.0–36.0)
MCV: 85 fL (ref 78.0–100.0)
Monocytes Absolute: 0.5 10*3/uL (ref 0.1–1.0)
Monocytes Relative: 9 % (ref 3–12)
NEUTROS PCT: 62 % (ref 43–77)
Neutro Abs: 3 10*3/uL (ref 1.7–7.7)
Platelets: 115 10*3/uL — ABNORMAL LOW (ref 150–400)
RBC: 3.59 MIL/uL — AB (ref 3.87–5.11)
RDW: 16 % — ABNORMAL HIGH (ref 11.5–15.5)
WBC: 5.1 10*3/uL (ref 4.0–10.5)

## 2013-06-13 LAB — ETHANOL: Alcohol, Ethyl (B): <11 mg/dL (ref 0–11)

## 2013-06-13 MED ORDER — AMLODIPINE BESYLATE 5 MG PO TABS
5.0000 mg | ORAL_TABLET | Freq: Every day | ORAL | Status: DC
  Filled 2013-06-13: qty 1

## 2013-06-13 MED ORDER — ACETAMINOPHEN 325 MG PO TABS: 650.0000 mg | ORAL_TABLET | ORAL | Status: DC | PRN

## 2013-06-13 MED ORDER — HALOPERIDOL LACTATE 5 MG/ML IJ SOLN
10.0000 mg | Freq: Once | INTRAMUSCULAR | Status: AC
  Administered 2013-06-13: 10 mg via INTRAMUSCULAR
  Filled 2013-06-13: qty 2

## 2013-06-13 MED ORDER — ONDANSETRON HCL 4 MG PO TABS: 4.0000 mg | ORAL_TABLET | Freq: Three times a day (TID) | ORAL | Status: DC | PRN

## 2013-06-13 MED ORDER — ALUM & MAG HYDROXIDE-SIMETH 200-200-20 MG/5ML PO SUSP: 30.0000 mL | ORAL | Status: DC | PRN

## 2013-06-13 MED ORDER — IBUPROFEN 200 MG PO TABS: 600.0000 mg | ORAL_TABLET | Freq: Three times a day (TID) | ORAL | Status: DC | PRN

## 2013-06-13 MED ORDER — SODIUM POLYSTYRENE SULFONATE 15 GM/60ML PO SUSP
15.0000 g | Freq: Four times a day (QID) | ORAL | Status: AC
  Administered 2013-06-14: 15 g via ORAL
  Filled 2013-06-13 (×2): qty 60

## 2013-06-13 MED ORDER — LORAZEPAM 1 MG PO TABS: 1.0000 mg | ORAL_TABLET | Freq: Three times a day (TID) | ORAL | Status: DC | PRN

## 2013-06-13 MED ORDER — ZOLPIDEM TARTRATE 5 MG PO TABS: 5.0000 mg | ORAL_TABLET | Freq: Every evening | ORAL | Status: DC | PRN
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
## 2013-06-13 NOTE — ED Notes (Signed)
Not able to to ekg or undress pt  at this time haldol given RN notifited
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
## 2013-06-14 ENCOUNTER — Encounter (HOSPITAL_COMMUNITY): Payer: Self-pay | Admitting: Emergency Medicine

## 2013-06-14 ENCOUNTER — Non-Acute Institutional Stay (HOSPITAL_BASED_OUTPATIENT_CLINIC_OR_DEPARTMENT_OTHER)
Admission: AD | Admit: 2013-06-14 | Discharge: 2013-06-15 | Disposition: A | Discharge status: Home / Self Care | Payer: PRIVATE HEALTH INSURANCE | Source: Ambulatory Visit | Admitting: Nephrology

## 2013-06-14 ENCOUNTER — Encounter: Payer: Self-pay | Admitting: Vascular Surgery

## 2013-06-14 DIAGNOSIS — R4182 Altered mental status, unspecified: Secondary | ICD-10-CM | POA: Insufficient documentation

## 2013-06-14 DIAGNOSIS — N19 Unspecified kidney failure: Secondary | ICD-10-CM

## 2013-06-14 DIAGNOSIS — E872 Acidosis, unspecified: Secondary | ICD-10-CM | POA: Insufficient documentation

## 2013-06-14 DIAGNOSIS — A6000 Herpesviral infection of urogenital system, unspecified: Secondary | ICD-10-CM

## 2013-06-14 DIAGNOSIS — Z9115 Patient's noncompliance with renal dialysis: Secondary | ICD-10-CM | POA: Insufficient documentation

## 2013-06-14 DIAGNOSIS — D649 Anemia, unspecified: Secondary | ICD-10-CM | POA: Insufficient documentation

## 2013-06-14 DIAGNOSIS — R45850 Homicidal ideations: Secondary | ICD-10-CM

## 2013-06-14 DIAGNOSIS — I120 Hypertensive chronic kidney disease with stage 5 chronic kidney disease or end stage renal disease: Secondary | ICD-10-CM

## 2013-06-14 DIAGNOSIS — F29 Unspecified psychosis not due to a substance or known physiological condition: Secondary | ICD-10-CM | POA: Insufficient documentation

## 2013-06-14 DIAGNOSIS — N179 Acute kidney failure, unspecified: Secondary | ICD-10-CM

## 2013-06-14 DIAGNOSIS — N185 Chronic kidney disease, stage 5: Secondary | ICD-10-CM

## 2013-06-14 DIAGNOSIS — N186 End stage renal disease: Secondary | ICD-10-CM | POA: Insufficient documentation

## 2013-06-14 DIAGNOSIS — Z992 Dependence on renal dialysis: Secondary | ICD-10-CM

## 2013-06-14 DIAGNOSIS — Z91158 Patient's noncompliance with renal dialysis for other reason: Secondary | ICD-10-CM | POA: Insufficient documentation

## 2013-06-14 MED ORDER — HALOPERIDOL 5 MG PO TABS
5.0000 mg | ORAL_TABLET | Freq: Two times a day (BID) | ORAL | Status: DC
  Administered 2013-06-14: 5 mg via ORAL
  Filled 2013-06-14: qty 1

## 2013-06-14 MED ORDER — SODIUM CHLORIDE 0.9 % IV SOLN: 100.0000 mL | INTRAVENOUS | Status: DC | PRN

## 2013-06-14 MED ORDER — CALCIUM ACETATE 667 MG PO CAPS
667.0000 mg | ORAL_CAPSULE | Freq: Four times a day (QID) | ORAL | Status: DC
Start: 2013-06-14 — End: 2013-06-14

## 2013-06-14 MED ORDER — RENA-VITE PO TABS: 1.0000 | ORAL_TABLET | Freq: Every day | ORAL | Status: DC

## 2013-06-14 MED ORDER — HEPARIN SODIUM (PORCINE) 1000 UNIT/ML DIALYSIS
4500.0000 [IU] | Freq: Once | INTRAMUSCULAR | Status: DC
  Filled 2013-06-14: qty 5

## 2013-06-14 MED ORDER — HALOPERIDOL LACTATE 5 MG/ML IJ SOLN
5.0000 mg | Freq: Once | INTRAMUSCULAR | Status: AC | PRN
  Administered 2013-06-14: 5 mg via INTRAMUSCULAR
  Filled 2013-06-14: qty 1

## 2013-06-14 MED ORDER — LIDOCAINE-PRILOCAINE 2.5-2.5 % EX CREA
1.0000 | TOPICAL_CREAM | CUTANEOUS | Status: DC | PRN
Start: 2013-06-14 — End: 2013-06-14

## 2013-06-14 MED ORDER — RENA-VITE PO TABS
1.0000 | ORAL_TABLET | Freq: Every day | ORAL | Status: DC
  Administered 2013-06-14: 1 via ORAL
  Filled 2013-06-14 (×2): qty 1

## 2013-06-14 MED ORDER — HEPARIN SODIUM (PORCINE) 1000 UNIT/ML DIALYSIS
6000.0000 [IU] | Freq: Once | INTRAMUSCULAR | Status: DC
  Filled 2013-06-14: qty 6

## 2013-06-14 MED ORDER — DIPHENHYDRAMINE HCL 50 MG/ML IJ SOLN
25.0000 mg | Freq: Once | INTRAMUSCULAR | Status: AC
  Administered 2013-06-14: 25 mg via INTRAMUSCULAR
  Filled 2013-06-14: qty 1

## 2013-06-14 MED ORDER — AMLODIPINE BESYLATE 5 MG PO TABS
5.0000 mg | ORAL_TABLET | Freq: Every day | ORAL | Status: DC
  Administered 2013-06-14: 5 mg via ORAL
  Filled 2013-06-14 (×2): qty 1

## 2013-06-14 MED ORDER — CALCITRIOL 0.25 MCG PO CAPS: 0.2500 ug | ORAL_CAPSULE | ORAL | Status: DC

## 2013-06-14 MED ORDER — LORAZEPAM 2 MG/ML IJ SOLN
1.0000 mg | Freq: Once | INTRAMUSCULAR | Status: AC | PRN
  Administered 2013-06-14: 1 mg via INTRAMUSCULAR
  Filled 2013-06-14: qty 1

## 2013-06-14 MED ORDER — LIDOCAINE HCL (PF) 1 % IJ SOLN: 5.0000 mL | INTRAMUSCULAR | Status: DC | PRN

## 2013-06-14 MED ORDER — LIDOCAINE-PRILOCAINE 2.5-2.5 % EX CREA
1.0000 "application " | TOPICAL_CREAM | CUTANEOUS | Status: DC | PRN
  Filled 2013-06-14: qty 5

## 2013-06-14 MED ORDER — HEPARIN SODIUM (PORCINE) 1000 UNIT/ML DIALYSIS: 1000.0000 [IU] | INTRAMUSCULAR | Status: DC | PRN

## 2013-06-14 MED ORDER — HALOPERIDOL 5 MG PO TABS: 5.0000 mg | ORAL_TABLET | Freq: Once | ORAL | Status: AC | PRN

## 2013-06-14 MED ORDER — PENTAFLUOROPROP-TETRAFLUOROETH EX AERO: 1.0000 "application " | INHALATION_SPRAY | CUTANEOUS | Status: DC | PRN

## 2013-06-14 MED ORDER — DARBEPOETIN ALFA-POLYSORBATE 100 MCG/0.5ML IJ SOLN: 100.0000 ug | INTRAMUSCULAR | Status: DC

## 2013-06-14 MED ORDER — CALCIUM ACETATE 667 MG PO CAPS
1334.0000 mg | ORAL_CAPSULE | Freq: Three times a day (TID) | ORAL | Status: DC
  Administered 2013-06-14: 1334 mg via ORAL
  Filled 2013-06-14 (×5): qty 2

## 2013-06-14 MED ORDER — NEPRO/CARBSTEADY PO LIQD: 237.0000 mL | ORAL | Status: DC | PRN

## 2013-06-14 MED ORDER — ALTEPLASE 2 MG IJ SOLR
2.0000 mg | Freq: Once | INTRAMUSCULAR | Status: DC | PRN
  Filled 2013-06-14: qty 2

## 2013-06-14 MED ORDER — LORAZEPAM 1 MG PO TABS: 1.0000 mg | ORAL_TABLET | Freq: Once | ORAL | Status: AC | PRN

## 2013-06-14 MED ORDER — ALTEPLASE 2 MG IJ SOLR: 2.0000 mg | Freq: Once | INTRAMUSCULAR | Status: DC | PRN

## 2013-06-14 MED ORDER — NEPRO/CARBSTEADY PO LIQD
237.0000 mL | ORAL | Status: DC | PRN
  Filled 2013-06-14: qty 237

## 2013-06-14 NOTE — Consult Note (Signed)
  Review of Systems  Constitutional: Negative.   HENT: Negative.   Eyes: Negative.   Respiratory: Negative.   Cardiovascular: Negative.   Gastrointestinal: Negative.   Genitourinary: Negative.   Musculoskeletal: Negative.   Skin: Negative.   Neurological: Negative.   Endo/Heme/Allergies: Negative.   Psychiatric/Behavioral: Positive for hallucinations and substance abuse.   Denies any aches or pains

## 2013-06-14 NOTE — Progress Notes (Signed)
Dispo Tech will refer patient

## 2013-06-14 NOTE — Progress Notes (Signed)
The following hospitals have been contacted regarding inptx on pt's behalf:  [place]- per Joycelyn Schmid at [facility]- per [place] at [facility]- per Iona Beard at [facility]- per Manus Gunning have bed availability and take pt's that undergo hemodialysis but do not take pt's with SA, pt's drug screen positive for cocaine. [place]- per Lonn Georgia at [facility] per Ruby Cola at [facility]- per Madaline Savage have beds available but dialysis is exclusionary criteria for their facility [place]- per Helene Kelp only have 1 gero-psych female bed available [facility]- per [employer] [facility] at capacity [facility]- per Delrae Sawyers at [facility] [employer]- per [place] at [facility]- per [place] bed availability but do not take pt's that have to have dialysis treatments [facility]- per [place] at [facility]- per [place] bed availability but for voluntary only [facility]- per Maris Berger at [facility] Disposition [facility]
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
## 2013-06-14 NOTE — ED Notes (Signed)
Bed: PYY51 Expected date:  Expected time:  Means of arrival:  Comments: Hold for dialysis pt

## 2013-06-14 NOTE — ED Notes (Signed)
Patient walked to nurses station and asked "who is that crying and beating on something" Writer respond no one is crying or beating on anything. Patient took sandwich and drink and walk to her room. Patient begin responding to external and internal stimuli. Patient actively having AVH at this time.

## 2013-06-14 NOTE — Progress Notes (Signed)
Subjective: 33 yo bf with esrd last hd Jun 02, 2013 secondary to noncompliance and Psychological problems, admitted to [place]. area of [facility] ER  Yesterday with psychotic AMS . Now on hd fairly calm but very talkative and not having  logical  Discussion   Objective Vital signs in last 24 hours: Filed Vitals:   06/14/13 1230  BP: 141/85  Pulse: 81   Weight change:   Labs: Basic Metabolic Panel:  Recent Labs Lab 06/13/13 1915  NA 141  K 5.9*  CL 106  CO2 10*  GLUCOSE 86  BUN 84*  CREATININE 17.31*  CALCIUM 10.0   Liver Function Tests:  Recent Labs Lab 06/13/13 1915  AST 23  ALT 17  ALKPHOS 57  BILITOT 0.3  PROT 7.9  ALBUMIN 4.3   CBC:  Recent Labs Lab 06/13/13 1915  WBC 5.1  NEUTROABS 3.0  HGB 10.1*  HCT 30.5*  MCV 85.0  PLT 115*    Studies/Results: No results found. Medications:   . [START ON 06/15/2013] heparin  6,000 Units Dialysis Once in dialysis  . multivitamin  1 tablet Oral QHS    Physical Exam: General: thin bf alert  But  Heart: RRR, 1/6 sem lsb no rub  Lungs: CTA bilat/ Abdomen: soft , nt, nd  Extremities: Dialysis Access: trace pedal edema/ RUA AVF Pos. Bruit Mature/ Refuses to use it / R IJ perm cath patent on hd   Dialysis: [facility] TTS 4h  61.5kg   2/2.0 Bath   Heparin 6000   RUA AVF / R IJ Cath (pt refuses to use AVF)  Calcitriol 0.25 each HD  EPO 20K   Venofer 50/wk  Problem/Plan: 1. Uremia/ acidosis sec to missed HD= Hd today and in am 3 hrs 2. ESRD - Normal TTS schedule/ refuses to use avf may be more cooperative when less uremic /and psychosis treated /use perm cath for now 3. Anemia - hgb 10.1 esa as op obtain records= 20,000 epo/ venofer 50mg  q hd 4. 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 Psychosis= Per admit team/Pscyh Team  Ernest Haber, PA-C [facility] Beeper [phone] 06/14/2013,12:54 PM  LOS: 0 days   Pt seen,  examined and agree w A/P as above. 33 yo AAF w hx of psychiatric illness presenting with confusion and suspected psychosis.  She has missed HD for about 2 weeks. Plan HD today.  She is very confused and hope she will maintain enough composure to get dialysis.  Plan HD today, then one extra HD tomorrow then resume TTS schedule.    Kelly Splinter MD pager [phone]    cell [phone] 06/14/2013, 1:54 PM
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
## 2013-06-15 ENCOUNTER — Encounter: Payer: Self-pay | Admitting: Psychiatry

## 2013-06-15 ENCOUNTER — Encounter: Payer: Self-pay | Admitting: Vascular Surgery

## 2013-06-15 DIAGNOSIS — F29 Unspecified psychosis not due to a substance or known physiological condition: Secondary | ICD-10-CM

## 2013-06-15 LAB — HEPATITIS B SURFACE ANTIBODY,QUALITATIVE: Hep B S Ab: NEGATIVE

## 2013-06-15 LAB — BASIC METABOLIC PANEL
BUN: 25 mg/dL — ABNORMAL HIGH (ref 6–23)
CHLORIDE: 102 meq/L (ref 96–112)
CO2: 26 meq/L (ref 19–32)
Calcium: 9.3 mg/dL (ref 8.4–10.5)
Creatinine, Ser: 7.83 mg/dL — ABNORMAL HIGH (ref 0.50–1.10)
GFR calc Af Amer: 7 mL/min — ABNORMAL LOW (ref >90)
GFR, EST NON AFRICAN AMERICAN: 6 mL/min — AB (ref >90)
Glucose, Bld: 84 mg/dL (ref 70–99)
Potassium: 3.5 mEq/L — ABNORMAL LOW (ref 3.7–5.3)
SODIUM: 143 meq/L (ref 137–147)

## 2013-06-15 LAB — HEPATITIS B CORE ANTIBODY, TOTAL: Hep B Core Total Ab: NONREACTIVE

## 2013-06-15 LAB — HEPATITIS B SURFACE ANTIGEN: HEP B S AG: NEGATIVE

## 2013-06-15 NOTE — ED Notes (Signed)
Bed: [id number] Expected date:  Expected time:  Means of arrival:  Comments: Sara [name]-Dialysis

## 2013-06-15 NOTE — ED Notes (Signed)
Returned from Dialysis and in no acute distress.

## 2013-06-15 NOTE — ED Notes (Signed)
Pt transferred to [facility] for Hemodialysis. She left the unit with [facility], [facility] and [employer] in no acute distress.

## 2013-06-15 NOTE — Progress Notes (Signed)
Spoke with Dr.Robert Schetz related to the discharge of pf patient. Patient has been cleared psychiatrically and can be discharged once cleared medically.  Dr. Elnita Maxwell states that patient has been cleared medically and is aware of her next scheduled appointment.  Once patient returns to [facility] she may be discharged.    Sara [name] B. Maley Venezia FNP-BC

## 2013-06-15 NOTE — Progress Notes (Signed)
Follow-up calls placed to the following facilities for inptx bed availability on pt's behalf:  [facility]- per Janett Billow at [place]- per Helene Kelp do not take pt's that require dialysis, considered "too medical" [facility]- per Lenna Sciara adult beds available but unsure if they take dialysis pt's stated she would have to refer question to the supervisor [facility]- per Rollene Fare, no famale beds available [facility]- per Maris Berger at [place]- per [place] at [facility]- per Maudie Mercury adult beds available, take dialysis pt's but on a case by case basis.  Referral faxed. [facility]- per [employer] available but do not take hemodialysis pt's [facility]- per Jonelle Sidle at capacity [facility]- per [facility] beds available but do not take dialysis pt's [facility]- at [place] Dis[facility]

## 2013-06-15 NOTE — Consult Note (Signed)
  Patient is back from dialysis.  Patient coherent, answer to questions, and behavior is appropriate.  Patient states that she has her own bus passes in her "pocket book" and that she is ready to go.  Patient is aware that she is to keep all of her outpatient appointments and to continue taking her medication at home.  Patient will be discharged home.  Brycin Kille B. Hasson Gaspard FNP-BC

## 2013-06-15 NOTE — Progress Notes (Signed)
Face to face evaluation and I agree with this note 

## 2013-06-15 NOTE — Progress Notes (Addendum)
I was present at this dialysis session, have reviewed the session itself and made  appropriate changes.    Patient is more appropriate today, knows her dialysis location and schedule including times, says she was "never sick in the first place".  [facility] staff asking if pt can go home, no reason from my standpoint medically.  Exam is unremarkable, no volume excess and lytes are in range.  Creat down 17 > 7 reflecting changes from HD after long absence.  She knows to go to her outpt HD tomorrow.    Kelly Splinter MD (pgr) [phone]    ([phone] 06/15/2013, 11:46 AM

## 2013-06-15 NOTE — Consult Note (Signed)
Face to face evaluation and I agree with this note 

## 2013-06-15 NOTE — Discharge Instructions (Signed)
Confusion °Confusion is the inability to think with your usual speed or clarity. Confusion may come on quickly or slowly over time. How quickly the confusion comes on depends on the cause. Confusion can be due to any number of causes. °CAUSES  °· Concussion, head injury, or head trauma. °· Seizures. °· Stroke. °· Fever. °· Senility. °· Heightened emotional states like rage or terror. °· Mental illness in which the person loses the ability to determine what is real and what is not (hallucinations). °· Infections. °· Toxic effects from alcohol, drugs, or prescription medicines. °· Dehydration and an imbalance of salts in the body (electrolytes). °· Lack of sleep. °· Low blood sugar (diabetes). °· Low levels of oxygen (for example from chronic lung disorders). °· Drug interactions or other medication side effects. °· Nutritional deficiencies, especially niacin, thiamine, vitamin C, or vitamin B. °· Sudden drop in body temperature (hypothermia). °· Illness in the elderly. Constipation can result in confusion. An elderly person who is hospitalized may become confused due to change in daily routine. °SYMPTOMS  °People often describe their thinking as cloudy or unclear when they are confused. Confusion can also include feeling disoriented. That means you are unaware of where or who you are. You may also not know what the date or time is. If confused, you may also have difficulty paying attention, remembering and making decisions. Some people also act aggressively when they are confused.  °DIAGNOSIS  °The medical evaluation of confusion may include: °· Blood and urine tests. °· X-rays. °· Brain and nervous system tests. °· Analyzing your brain waves (electroencphalogram or EEG). °· A special X-ray (MRI) of your head or other special studies. °Your physician will ask questions such as: °· Do you get days and nights mixed up? °· Are you awake during regular sleep times? °· Do you have trouble recognizing people? °· Do you  know where you are? °· Do you know the date and time? °· Does the confusion come and go? °· Is the confusion quickly getting worse? °· Has there been a recent illness? °· Has there been a recent head injury? °· Are you diabetic? °· Do you have a lung disorder? °· What medication are you taking? °· Have you taken drugs or alcohol? °TREATMENT  °An admission to the hospital may not be needed, but a confused person should not be left alone. Stay with a family member or friend until the confusion clears. Avoid alcohol, pain relievers or sedative drugs until you have fully recovered. Do not drive until your caregiver says it is okay. °HOME CARE INSTRUCTIONS °What family and friends can do: °· To find out if someone is confused ask him or her their name, age, and the date. If the person is unsure or answers incorrectly, he or she is confused. °· Always introduce yourself, no matter how well the person knows you. °· Often remind the person of his or her location. °· Place a calendar and clock near the confused person. °· Talk about current events and plans for the day. °· Try to keep the environment calm, quiet and peaceful. °· Make sure the patient keeps follow up appointments with their physician. °PREVENTION  °Ways to prevent confusion: °· Avoid alcohol. °· Eat a balanced diet. °· Get enough sleep. °· Do not become isolated. Spend time with other people and make plans for your days. °· Keep careful watch on your blood sugar levels if you are diabetic. °SEEK IMMEDIATE MEDICAL CARE IF:  °· You develop   severe headaches, repeated vomiting, seizures, blackouts or slurred speech.  There is increasing confusion, weakness, numbness, restlessness or personality changes.  You develop a loss of balance, have marked dizziness, feel uncoordinated or fall.  You have delusions, hallucinations or develop severe anxiety.  Your family members think you need to be rechecked. Document Released: 05/01/2004 Document Revised:  06/16/2011 Document Reviewed: 12/28/2007 [facility] Patient Information 2014 [place], [place].  Paranoia Paranoia is a distrust of others that is not based on a real reason for distrust. This may reach delusional levels. This means the paranoid person feels the world is against them when there is no reason to make them feel that way. People with paranoia feel as though people around them are "out to get them".  SIMILAR MENTAL ILNESSES  Depression is a feeling as though you are down all the time. It is normal in some situations where you have just lost a loved one. It is abnormal if you are having feelings of paranoia with it.  Dementia is a physical problem with the brain in which the brain no longer works properly. There are problems with daily activities of living. Alzheimer's disease is one example of this. Dementia is also caused by old age changes in the brain which come with the death of brain cells and small strokes.  Paranoidschizophrenia. People with paranoid schizophrenia and persecutory delusional disorder have delusions in which they feel people around them are plotting against them. Persecutory delusions in paranoid schizophrenia are bizarre, sometimes grandiose, and often accompanied by auditory hallucinations. This means the person is hearing voices that are not there.  Delusionaldisorder (persecutory type). Delusions experienced by individuals with delusional disorder are more believable than those experienced by paranoid schizophrenics; they are not bizarre, though still unjustified. Individuals with delusional disorder may seem offbeat or quirky rather than mentally ill, and therefore, may never seek treatment. All of these problems usually do not allow these people to interact socially in an acceptable manner. CAUSES The cause of paranoia is often not known. It is common in people with extended abuse of:  Cocaine.  Amphetamine.  Marijuana.  Alcohol. Sometimes there is an  inherited tendency. It may be associated with stress or changes in brain chemistry. DIAGNOSIS  When paranoia is present, your caregiver may:  Refer you to a specialist.  Do a physical exam.  Perform other tests on you to make sure there are not other problems causing the paranoia including:  Physical problems.  Mental problems.  Chemical problems (other than drugs). Testing may be done to determine if there is a psychiatric disability present that can be treated with medicine. TREATMENT   Paranoia that is a symptom of a psychiatric problem should be treated by professionals.  Medicines are available which can help this disorder. Antipsychotic medicine may be prescribed by your caregiver.  Sometimes psychotherapy may be useful.  Conditions such as depression or drug abuse are treated individually. If the paranoia is caused by drug abuse, a treatment facility may be helpful. Depression may be helped by antidepressants. PROGNOSIS   Paranoid people are difficult to treat because of their belief that everyone is out to get them or harm them. Because of this mistrust, they often must be talked into entering treatment by a trusted family member or friend. They may not want to take medicine as they may see this as an attempt to poison them.  Gradual gains in the trust of a therapist or caregiver helps in a successful treatment plan.  Some  people with PPD or persecutory delusional disorder function in society without treatment in limited fashion. Document Released: 03/27/2003 Document Revised: 06/16/2011 Document Reviewed: 11/30/2007 [facility] Patient Information 2014 [place].  Hallucinations and Delusions You seem to be having hallucinations and/or delusions. You may be hearing voices that no one else can hear. This can seem very real to you. You may be having thoughts and fears that do not make sense to others. This condition can be due to mental disease like schizophrenia. It may  be caused by a medical condition, such as an infection or electrolyte disturbance. These symptoms are also seen in drug abusers, especially those who use crack cocaine and amphetamines. Drugs like PCP, LSD, MDMA, peyote, and psilocybin can also cause frightening hallucinations and loss of control. If your symptoms are due to drug abuse, your mental state should improve as the drug(s) leave your system. Someone you trust should be with you until you are better to protect you and calm your fears. Often tranquilizers are very helpful at controlling hallucinations, anxiety, and destructive behavior. Getting a proper diet and enough sleep is important to recovery. If your symptoms are not due to drugs, or do not improve over several days after stopping drug use, you need further medical or mental health care. SEEK IMMEDIATE MEDICAL CARE IF:   Your symptoms get worse, especially if you think your life is in danger  You have violent or destructive thoughts. Recovery is possible, but you have to get proper treatment and avoid drugs that are known to cause you trouble. Document Released: 05/01/2004 Document Revised: 06/16/2011 Document Reviewed: 03/24/2005 [facility] Patient Information 2014 [place].  Psychosis Psychosis refers to a severe lack of understanding with reality. During a psychotic episode, you are not able to think clearly. During a psychotic episode, your responses and emotions are inappropriate and do not coincide with what is actually happening. You often have false beliefs about what is happening or who you are (delusions), and you may see, hear, taste, smell, or feel things that are not present (hallucinations). Psychosis is usually a severe symptom of a very serious mental health (psychiatric) condition, but it can sometimes be the result of a medical condition. CAUSES   Psychiatric conditions, such as:  Schizophrenia.  Bipolar disorder.  Depression.  Personality  disorders.  Alcohol or drug abuse.  Medical conditions, such as:  Brain injury.  Brain tumor.  Dementia.  Brain diseases, such as Alzheimer's, Parkinson's, or Huntington's disease.  Neurological diseases, such as epilepsy.  Genetic disorders.  Metabolic disorders.  Infections that affect the brain.  Certain prescription drugs.  Stroke. SYMPTOMS   Unable to think or speak clearly or respond appropriately.  Disorganized thinking (thoughts jump from one thought to another).  Severe inappropriate behavior.  Delusions may include:  A strong belief that is odd, unrealistic, or false.  Feeling extremely fearful or suspicious (paranoid).  Believing you are someone else, have high importance, or have an altered identity.  Hallucinations. DIAGNOSIS   Mental health evaluation.  Physical exam.  Blood tests.  Computerized magnetic scan (MRI) or other brain scans. TREATMENT  Your caregiver will recommend a course of treatment that depends on the cause of the psychosis. Treatment may include:  Monitoring and supportive care in the hospital.  Taking medicines (antipsychotic medicine) to reduce symptoms and balance chemicals in the brain.  Taking medicines to manage underlying mental health conditions.  Therapy and other supportive programs outside of the hospital.  Treating an underlying medical condition.  If the cause of the psychosis can be treated or corrected, the outlook is good. Without treatment, psychotic episodes can cause danger to yourself or others. Treatment may be short-term or lifelong. HOME CARE INSTRUCTIONS   Take all medicines as directed. This is important.  Use a pillbox or write down your medicine schedule to make sure you are taking them.  Check with your caregiver before using over-the-counter medicines, herbs, or supplements.  Seek individual and family support through therapy and mental health education (psychoeducation) programs.  These will help you manage symptoms and side effects of medicines, learn life skills, and maintain a healthy routine.  Maintain a healthy lifestyle.  Exercise regularly.  Avoid alcohol and drugs.  Learn ways to reduce stress and cope with stress, such as yoga and meditation.  Talk about your feelings with family members or caregivers.  Make time for yourself to do things you enjoy.  Know the early warning signs of psychosis. Your caregiver will recommend steps to take when you notice symptoms such as:  Feeling anxious or preoccupied.  Having racing thoughts.  Changes in your interest in life and relationships.  Follow up with your caregivers for continued outpatient treatment as directed. SEEK MEDICAL CARE IF:   Medicines do not seem to be helping.  You hear voices telling you to do things.  You see, smell, or feel things that are not there.  You feel hopeless and overwhelmed.  You feel extremely fearful and suspicious that something will harm you.  You feel like you cannot leave your house.  You have trouble taking care of yourself.  You experience side effects of medicines, such as changes in sleep patterns, dizziness, weight gain, restlessness, movement changes, muscle spasms, or tremors. SEEK IMMEDIATE MEDICAL CARE IF:  Severe psychotic symptoms present a safety issue (such as an urge to hurt yourself or others). MAKE SURE YOU:   Understand these instructions.  Will watch your condition.  Will get help right away if you are not doing well or get worse. [place] [facility]: [email or web address] Document Released: 09/11/2009 Document Revised: 06/16/2011 Document Reviewed: 09/11/2009 [facility] Patient Information 2014 [place], [place].
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
## 2013-07-18 ENCOUNTER — Ambulatory Visit: Payer: Self-pay | Admitting: Family Medicine

## 2013-07-18 ENCOUNTER — Encounter (HOSPITAL_COMMUNITY): Payer: Self-pay | Admitting: Emergency Medicine

## 2013-07-18 ENCOUNTER — Encounter: Payer: Self-pay | Admitting: General Practice

## 2013-07-18 ENCOUNTER — Inpatient Hospital Stay (HOSPITAL_COMMUNITY)
Admission: EM | Admit: 2013-07-18 | Discharge: 2013-07-19 | DRG: 947 | Payer: PRIVATE HEALTH INSURANCE | Attending: Family Medicine | Admitting: Family Medicine

## 2013-07-18 ENCOUNTER — Telehealth: Payer: Self-pay | Admitting: General Practice

## 2013-07-18 DIAGNOSIS — F319 Bipolar disorder, unspecified: Secondary | ICD-10-CM | POA: Diagnosis present

## 2013-07-18 DIAGNOSIS — Z781 Physical restraint status: Secondary | ICD-10-CM | POA: Diagnosis present

## 2013-07-18 DIAGNOSIS — N186 End stage renal disease: Secondary | ICD-10-CM | POA: Diagnosis present

## 2013-07-18 DIAGNOSIS — R4182 Altered mental status, unspecified: Principal | ICD-10-CM | POA: Diagnosis present

## 2013-07-18 DIAGNOSIS — F209 Schizophrenia, unspecified: Secondary | ICD-10-CM | POA: Diagnosis present

## 2013-07-18 DIAGNOSIS — R4689 Other symptoms and signs involving appearance and behavior: Secondary | ICD-10-CM | POA: Diagnosis present

## 2013-07-18 DIAGNOSIS — I120 Hypertensive chronic kidney disease with stage 5 chronic kidney disease or end stage renal disease: Secondary | ICD-10-CM | POA: Diagnosis present

## 2013-07-18 DIAGNOSIS — F17200 Nicotine dependence, unspecified, uncomplicated: Secondary | ICD-10-CM | POA: Diagnosis present

## 2013-07-18 DIAGNOSIS — Z79899 Other long term (current) drug therapy: Secondary | ICD-10-CM

## 2013-07-18 DIAGNOSIS — F39 Unspecified mood [affective] disorder: Secondary | ICD-10-CM | POA: Diagnosis present

## 2013-07-18 DIAGNOSIS — N058 Unspecified nephritic syndrome with other morphologic changes: Secondary | ICD-10-CM | POA: Diagnosis present

## 2013-07-18 DIAGNOSIS — Z992 Dependence on renal dialysis: Secondary | ICD-10-CM | POA: Diagnosis present

## 2013-07-18 DIAGNOSIS — M329 Systemic lupus erythematosus, unspecified: Secondary | ICD-10-CM | POA: Diagnosis present

## 2013-07-18 LAB — CBC WITH DIFFERENTIAL/PLATELET
BASOS PCT: 1 % (ref 0–1)
Basophils Absolute: 0.1 10*3/uL (ref 0.0–0.1)
EOS ABS: 0.1 10*3/uL (ref 0.0–0.7)
Eosinophils Relative: 1 % (ref 0–5)
HCT: 32.6 % — ABNORMAL LOW (ref 36.0–46.0)
HEMOGLOBIN: 10.7 g/dL — AB (ref 12.0–15.0)
LYMPHS PCT: 28 % (ref 12–46)
Lymphs Abs: 1.4 10*3/uL (ref 0.7–4.0)
MCH: 27.8 pg (ref 26.0–34.0)
MCHC: 32.8 g/dL (ref 30.0–36.0)
MCV: 84.7 fL (ref 78.0–100.0)
Monocytes Absolute: 0.7 10*3/uL (ref 0.1–1.0)
Monocytes Relative: 14 % — ABNORMAL HIGH (ref 3–12)
NEUTROS PCT: 56 % (ref 43–77)
Neutro Abs: 2.7 10*3/uL (ref 1.7–7.7)
Platelets: 179 10*3/uL (ref 150–400)
RBC: 3.85 MIL/uL — ABNORMAL LOW (ref 3.87–5.11)
RDW: 17.2 % — ABNORMAL HIGH (ref 11.5–15.5)
WBC: 5 10*3/uL (ref 4.0–10.5)

## 2013-07-18 LAB — RAPID URINE DRUG SCREEN, HOSP PERFORMED
Amphetamines: NOT DETECTED
Barbiturates: NOT DETECTED
Benzodiazepines: NOT DETECTED
Cocaine: NOT DETECTED
Opiates: NOT DETECTED
TETRAHYDROCANNABINOL: NOT DETECTED

## 2013-07-18 LAB — URINALYSIS, ROUTINE W REFLEX MICROSCOPIC
Bilirubin Urine: NEGATIVE
Glucose, UA: NEGATIVE mg/dL
Ketones, ur: NEGATIVE mg/dL
LEUKOCYTES UA: NEGATIVE
Nitrite: NEGATIVE
SPECIFIC GRAVITY, URINE: 1.011 (ref 1.005–1.030)
UROBILINOGEN UA: 0.2 mg/dL (ref 0.0–1.0)
pH: 6.5 (ref 5.0–8.0)

## 2013-07-18 LAB — BASIC METABOLIC PANEL
BUN: 83 mg/dL — ABNORMAL HIGH (ref 6–23)
CALCIUM: 9.7 mg/dL (ref 8.4–10.5)
CO2: 12 mEq/L — ABNORMAL LOW (ref 19–32)
Chloride: 97 mEq/L (ref 96–112)
Creatinine, Ser: 17.81 mg/dL — ABNORMAL HIGH (ref 0.50–1.10)
GFR, EST AFRICAN AMERICAN: 3 mL/min — AB (ref >90)
GFR, EST NON AFRICAN AMERICAN: 2 mL/min — AB (ref >90)
GLUCOSE: 80 mg/dL (ref 70–99)
Potassium: 4.9 mEq/L (ref 3.7–5.3)
SODIUM: 141 meq/L (ref 137–147)

## 2013-07-18 LAB — URINE MICROSCOPIC-ADD ON

## 2013-07-18 LAB — PREGNANCY, URINE: PREG TEST UR: NEGATIVE

## 2013-07-18 LAB — ETHANOL: Alcohol, Ethyl (B): <11 mg/dL (ref 0–11)

## 2013-07-18 MED ORDER — CALCIUM ACETATE 667 MG PO CAPS
2001.0000 mg | ORAL_CAPSULE | Freq: Three times a day (TID) | ORAL | Status: DC
  Administered 2013-07-18: 2001 mg via ORAL
  Filled 2013-07-18 (×5): qty 3

## 2013-07-18 MED ORDER — ZIPRASIDONE MESYLATE 20 MG IM SOLR
INTRAMUSCULAR | Status: AC
  Administered 2013-07-18: 20 mg via INTRAMUSCULAR
  Filled 2013-07-18: qty 20

## 2013-07-18 MED ORDER — ONDANSETRON HCL 4 MG PO TABS
4.0000 mg | ORAL_TABLET | Freq: Three times a day (TID) | ORAL | Status: DC | PRN
Start: 2013-07-18 — End: 2013-07-19

## 2013-07-18 MED ORDER — CALCIUM ACETATE 667 MG PO CAPS
667.0000 mg | ORAL_CAPSULE | Freq: Every day | ORAL | Status: DC | PRN
  Filled 2013-07-18: qty 1

## 2013-07-18 MED ORDER — HALOPERIDOL 5 MG PO TABS
5.0000 mg | ORAL_TABLET | Freq: Two times a day (BID) | ORAL | Status: DC
  Filled 2013-07-18: qty 1

## 2013-07-18 MED ORDER — RENA-VITE PO TABS
1.0000 | ORAL_TABLET | Freq: Every day | ORAL | Status: DC
  Filled 2013-07-18 (×2): qty 1

## 2013-07-18 MED ORDER — STERILE WATER FOR INJECTION IJ SOLN
INTRAMUSCULAR | Status: AC
  Administered 2013-07-18: 22:00:00
  Filled 2013-07-18: qty 10

## 2013-07-18 MED ORDER — NICOTINE 21 MG/24HR TD PT24: 21.0000 mg | MEDICATED_PATCH | Freq: Every day | TRANSDERMAL | Status: DC | PRN

## 2013-07-18 MED ORDER — ZOLPIDEM TARTRATE 5 MG PO TABS: 5.0000 mg | ORAL_TABLET | Freq: Every evening | ORAL | Status: DC | PRN

## 2013-07-18 MED ORDER — ALUM & MAG HYDROXIDE-SIMETH 200-200-20 MG/5ML PO SUSP: 30.0000 mL | ORAL | Status: DC | PRN

## 2013-07-18 MED ORDER — LORAZEPAM 2 MG/ML IJ SOLN
2.0000 mg | Freq: Once | INTRAMUSCULAR | Status: AC
Start: 2013-07-18 — End: 2013-07-18
  Administered 2013-07-18: 2 mg via INTRAMUSCULAR
  Filled 2013-07-18: qty 1

## 2013-07-18 MED ORDER — STERILE WATER FOR INJECTION IJ SOLN
INTRAMUSCULAR | Status: AC
  Administered 2013-07-18: 1.2 mL
  Filled 2013-07-18: qty 10

## 2013-07-18 MED ORDER — IBUPROFEN 200 MG PO TABS
600.0000 mg | ORAL_TABLET | Freq: Three times a day (TID) | ORAL | Status: DC | PRN
Start: 2013-07-18 — End: 2013-07-19

## 2013-07-18 MED ORDER — ZIPRASIDONE MESYLATE 20 MG IM SOLR
20.0000 mg | Freq: Once | INTRAMUSCULAR | Status: AC
  Administered 2013-07-18: 20 mg via INTRAMUSCULAR
  Filled 2013-07-18: qty 20

## 2013-07-18 MED ORDER — ACETAMINOPHEN 325 MG PO TABS: 650.0000 mg | ORAL_TABLET | ORAL | Status: DC | PRN

## 2013-07-18 MED ORDER — ZIPRASIDONE MESYLATE 20 MG IM SOLR
20.0000 mg | Freq: Once | INTRAMUSCULAR | Status: AC
  Administered 2013-07-18: 20 mg via INTRAMUSCULAR

## 2013-07-18 NOTE — ED Notes (Signed)
Pt cannot go to behavioral ED d/t central cath.
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
## 2013-07-18 NOTE — ED Notes (Signed)
Tried to do an in and out cath on pt but was unsuccessful
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
## 2013-07-18 NOTE — Telephone Encounter (Signed)
Patient no showed for appt today. Called patient, no answer- left message stating it looks like she missed her appt with [place] today, please call our front office staff if you would like to reschedule. Will send letter
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
## 2013-07-18 NOTE — ED Notes (Signed)
TTS at bedside. 

## 2013-07-18 NOTE — ED Notes (Signed)
Belongings put in locker #33.Marland Kitchenone bag contains perishable food from home..Nurse aware

## 2013-07-18 NOTE — ED Notes (Signed)
Took raw meat and nachos out of locker #33

## 2013-07-18 NOTE — ED Notes (Signed)
Unable to get temp.  Pt continues to pull against staff.  bp elevated. Pt moving arm

## 2013-07-19 ENCOUNTER — Encounter (HOSPITAL_COMMUNITY): Payer: Self-pay | Admitting: Registered Nurse

## 2013-07-19 DIAGNOSIS — F39 Unspecified mood [affective] disorder: Secondary | ICD-10-CM | POA: Diagnosis present

## 2013-07-19 DIAGNOSIS — Z992 Dependence on renal dialysis: Secondary | ICD-10-CM

## 2013-07-19 DIAGNOSIS — R4689 Other symptoms and signs involving appearance and behavior: Secondary | ICD-10-CM | POA: Diagnosis present

## 2013-07-19 DIAGNOSIS — N186 End stage renal disease: Secondary | ICD-10-CM | POA: Diagnosis present

## 2013-07-19 LAB — BASIC METABOLIC PANEL WITH GFR
BUN: 85 mg/dL — ABNORMAL HIGH (ref 6–23)
CO2: 14 meq/L — ABNORMAL LOW (ref 19–32)
Calcium: 10.5 mg/dL (ref 8.4–10.5)
Chloride: 97 meq/L (ref 96–112)
Creatinine, Ser: 18.4 mg/dL — ABNORMAL HIGH (ref 0.50–1.10)
GFR calc Af Amer: 3 mL/min — ABNORMAL LOW
GFR calc non Af Amer: 2 mL/min — ABNORMAL LOW
Glucose, Bld: 79 mg/dL (ref 70–99)
Potassium: 5.3 meq/L (ref 3.7–5.3)
Sodium: 139 meq/L (ref 137–147)

## 2013-07-19 MED ORDER — STERILE WATER FOR INJECTION IJ SOLN
INTRAMUSCULAR | Status: AC
  Administered 2013-07-19: 1.2 mL
  Filled 2013-07-19: qty 10

## 2013-07-19 MED ORDER — ZIPRASIDONE MESYLATE 20 MG IM SOLR
INTRAMUSCULAR | Status: AC
  Administered 2013-07-19: 06:00:00 via INTRAMUSCULAR
  Filled 2013-07-19: qty 20

## 2013-07-19 NOTE — ED Notes (Signed)
Pt verbally abusing staff, cussing at staff members, yelling and being disruptive.

## 2013-07-19 NOTE — ED Notes (Signed)
Patient verbalized understanding of discharge instructions. Patient was stable at discharge. Patient was given personal belongings.

## 2013-07-19 NOTE — Progress Notes (Signed)
Per Dr. Lovena Le the patient is psychiatrically stable and there is no reason to uphold IVC.  Dr. Lovena Le rescinded the IVC.   Writer faxed the paperwork to the [place] office.  Writer gave the original to the nurse working with the patient.  EDP can monitor outcome of medical issues with dialysis if medical admit is needed.

## 2013-07-19 NOTE — ED Notes (Signed)
Pt spitting on floor, still in room yelling, sitter at doorway

## 2013-07-19 NOTE — ED Notes (Signed)
Blood Lab draw from Left Arm vein. Patient tolerated well. Blood sent to Lab stat.

## 2013-07-19 NOTE — ED Notes (Signed)
Patient continues to e verbally abusive to nurse and sitter. Patient communicating threats.

## 2013-07-19 NOTE — Discharge Instructions (Signed)
Go directly to your dialyses center now, as you plan on doing.  Follow up with your primary care doctor in the next couple days. Also, follow up at [facility] for mental health issues in the next couple days. Return to ER if worse, new symptoms, depression, thoughts of self harm, other concern.     End-Stage Kidney Disease The kidneys are two organs that lie on either side of the spine between the middle of the back and the front of the abdomen. The kidneys:   Remove wastes and extra water from the blood.   Produce important hormones. These help keep bones strong, regulate blood pressure, and help create red blood cells.   Balance the fluids and chemicals in the blood and tissues. End-stage kidney disease occurs when the kidneys are so damaged that they cannot do their job. When the kidneys cannot do their job, life-threatening problems occur. The body cannot stay clean and strong without the help of the kidneys. In end-stage kidney disease, the kidneys cannot get better.You need a new kidney or treatments to do some of the work healthy kidneys do in order to stay alive. CAUSES  End-stage kidney disease usually occurs when a long-lasting (chronic) kidney disease gets worse. It may also occur after the kidneys are suddenly damaged (acute kidney injury).  SYMPTOMS   Swelling (edema) of the legs, ankles, or feet.   Tiredness (lethargy).   Nausea or vomiting.   Confusion.   Problems with urination, such as:   Decreased urine production.   Frequent urination, especially at night.   Frequent accidents in children who are potty trained.   Muscle twitches and cramps.   Persistent itchiness.   Loss of appetite.   Headaches.   Abnormally dark or light skin.   Numbness in the hands or feet.   Easy bruising.   Frequent hiccups.   Menstruation stops. DIAGNOSIS  Your caregiver will measure your blood pressure and take some tests. These may include:    Urine tests.   Blood tests.   Imaging tests, such as:   An ultrasound exam.   Computed tomography (CT).  A kidney biopsy. TREATMENT  There are two treatments for end-stage kidney disease:   A procedure that removes toxic wastes from the body (dialysis).   Receiving a new kidney (kidney transplant). Both of these treatments have serious risks and consequences. Your caregiver will help you determine which treatment is best for you based on your health, age, and other factors. In addition to having dialysis or a kidney transplant, you may need to take medicines to control high blood pressure (hypertension) and cholesterol and to decrease phosphorus levels in your blood.  HOME CARE INSTRUCTIONS  Follow your prescribed diet.   Only take over-the-counter or prescription medicines as directed by your caregiver.   Do not take any new medicines (prescription, over-the-counter, or nutritional supplements) unless approved by your caregiver. Many medicines can worsen your kidney damage or need to have the dose adjusted.   Keep all follow-up appointments. MAKE SURE YOU:  Understand these instructions.  Will watch your condition.  Will get help right away if you are not doing well or get worse Document Released: 06/14/2003 Document Revised: 03/10/2012 Document Reviewed: 11/21/2011 [facility] Patient Information 2014 [place].     Hemodialysis Dialysis is a procedure that replaces some of the work that healthy kidneys do. It is done when you lose about 85 90% of your kidney function and sometimes earlier if your symptoms may be  improved by dialysis. During dialysis, wastes, salt, and extra water are removed from the blood; and the level of certain chemicals in the blood (such as potassium) is maintained. Hemodialysis is a type of dialysis in which a machine called a dialyzer is used to filter the blood. Hemodialysis is usually performed by a caregiver at a hospital or  dialysis center 3 times a week for 3 5 hours during each visit. It may also be performed more frequently and for longer periods of time at home with training. LET YOUR CAREGIVER KNOW ABOUT:  Any allergies you have.  All medications you are taking, including vitamins, herbs, eyedrops, and over-the-counter medications and creams.  Any blood disorders you have had.  Other health problems you have. RISKS AND COMPLICATIONS Generally, hemodialysis is a safe procedure. However, as with any procedure, complications can occur. Possible complications include:  Low blood pressure (hypotension).  Narrowing or ballooning of a blood vessel or bleeding at the site where blood is removed from the body and returned to the body (vascular access).  An infection or blockage at the vascular access site. BEFORE THE PROCEDURE Before having hemodialysis for the first time, you will need surgery to create a site where blood can be removed from the body and returned to the body (vascular access). There are three types of vascular accesses:  Arteriovenous fistula. This type of access is created when an artery and a vein (usually in the arm) are connected during surgery. The arteriovenous fistula takes 1 6 months to develop after surgery.  Arteriovenous graft. This type of access is created when an artery and a vein in the arm are connected during surgery with a tube. An arteriovenous graft can be used within 2 3 weeks of surgery.  A venous catheter. To create this type of access, a thin, flexible tube (catheter) is placed in a large vein in your neck, chest, or groin. A venous catheter can be used right away. PROCEDURE  Hemodialysis is performed while you are sitting or reclining. During the procedure, you may sleep, read, watch television, or perform any other tasks that can be done while you are in this position. If you experience side effects or any other discomfort during the procedure, let your caregiver know.  He or she may be able to make adjustments to help you feel more comfortable. Your hemodialysis process may look something like this: 1. Your weight, blood pressure, pulse, and temperature will be measured. 2. The skin around your vascular access will be sterilized. 3. Your vascular access will be connected to the dialyzer. If your access is a fistula or graft, two needles will be inserted through the vascular access. The needles will be connected to a plastic tube that is connected to the dialyzer. They will be taped to your skin so that they do not move out of place. If your access is a catheter, it will be connected to a plastic tube that is connected to the dialyzer. 4. The dialysis machine will be turned on. This will cause your blood to flow to the dialyzer. The dialyzer will filter and clean your blood before returning it to your body. While the dialysis machine is working, your blood pressure and pulse will be checked several times. 5. Once dialysis is complete, your vascular access will be disconnected from the dialyzer. If your access is a fistula or graft, the needles will be removed and a bandage (dressing) will be placed over the access. AFTER THE PROCEDURE  Your  weight will be measured.  Your blood may be tested to see whether your treatments are removing enough wastes. This is usually done once a month.  You may experience or continue to experience side effects, including:  Dizziness.  Muscle cramps.  Nausea.  Headaches.  Feeling tired. Energy levels usually return to normal the day after the procedure.  Itchiness. Your caregiver may be able to prescribe medication to relieve it.  Achy or jittery legs. You may feel like kicking your legs. This sometimes causes sleeping problems.  Allergic reaction to the chemicals used to sterilize the dialyzer. Document Released: 07/19/2012 Document Reviewed: 07/19/2012 [facility] Patient Information 2014 [place], [place].    Bipolar  Disorder Bipolar disorder is a mental illness. The term bipolar disorder actually is used to describe a group of disorders that all share varying degrees of emotional highs and lows that can interfere with daily functioning, such as work, school, or relationships. Bipolar disorder also can lead to drug abuse, hospitalization, and suicide. The emotional highs of bipolar disorder are periods of elation or irritability and high energy. These highs can range from a mild form (hypomania) to a severe form (mania). People experiencing episodes of hypomania may appear energetic, excitable, and highly productive. People experiencing mania may behave impulsively or erratically. They often make poor decisions. They may have difficulty sleeping. The most severe episodes of mania can involve having very distorted beliefs or perceptions about the world and seeing or hearing things that are not real (psychotic delusions and hallucinations).  The emotional lows of bipolar disorder (depression) also can range from mild to severe. Severe episodes of bipolar depression can involve psychotic delusions and hallucinations. Sometimes people with bipolar disorder experience a state of mixed mood. Symptoms of hypomania or mania and depression are both present during this mixed-mood episode. SIGNS AND SYMPTOMS There are signs and symptoms of the episodes of hypomania and mania as well as the episodes of depression. The signs and symptoms of hypomania and mania are similar but vary in severity. They include:  Inflated self-esteem or feeling of increased self-confidence.  Decreased need for sleep.  Unusual talkativeness (rapid or pressured speech) or the feeling of a need to keep talking.  Sensation of racing thoughts or constant talking, with quick shifts between topics that may or may not be related (flight of ideas).  Decreased ability to focus or concentrate.  Increased purposeful activity, such as work, studies, or  social activity, or nonproductive activity, such as pacing, squirming and fidgeting, or finger and toe tapping.  Impulsive behavior and use of poor judgment, resulting in high-risk activities, such as having unprotected sex or spending excessive amounts of money. Signs and symptoms of depression include the following:   Feelings of sadness, hopelessness, or helplessness.  Frequent or uncontrollable episodes of crying.  Lack of feeling anything or caring about anything.  Difficulty sleeping or sleeping too much.  Inability to enjoy the things you used to enjoy.   Desire to be alone all the time.   Feelings of guilt or worthlessness.  Lack of energy or motivation.   Difficulty concentrating, remembering, or making decisions.  Change in appetite or weight beyond normal fluctuations.  Thoughts of death or the desire to harm yourself. DIAGNOSIS  Bipolar disorder is diagnosed through an assessment by your caregiver. Your caregiver will ask questions about your emotional episodes. There are two main types of bipolar disorder. People with type I bipolar disorder have manic episodes with or without depressive episodes. People with  type II bipolar disorder have hypomanic episodes and major depressive episodes, which are more serious than mild depression. The type of bipolar disorder you have can make an important difference in how your illness is monitored and treated. Your caregiver may ask questions about your medical history and use of alcohol or drugs, including prescription medication. Certain medical conditions and substances also can cause emotional highs and lows that resemble bipolar disorder (secondary bipolar disorder).  TREATMENT  Bipolar disorder is a long-term illness. It is best controlled with continuous treatment rather than treatment only when symptoms occur. The following treatments can be prescribed for bipolar disorders:  Medication Medication can be prescribed by a  doctor that is an expert in treating mental disorders (psychiatrists). Medications called mood stabilizers are usually prescribed to help control the illness. Other medications are sometimes added if symptoms of mania, depression, or psychotic delusions and hallucinations occur despite the use of a mood stabilizer.  Talk therapy Some forms of talk therapy are helpful in providing support, education, and guidance. A combination of medication and talk therapy is best for managing the disorder over time. A procedure in which electricity is applied to your brain through your scalp (electroconvulsive therapy) is used in cases of severe mania when medication and talk therapy do not work or work too slowly. Document Released: 06/30/2000 Document Revised: 07/19/2012 Document Reviewed: 04/19/2012 [facility] Patient Information 2014 [place], [place].    Mood Disorders Mood disorders are conditions that affect the way a person feels emotionally. The main mood disorders include:  Depression.  Bipolar disorder.  Dysthymia. Dysthymia is a mild, lasting (chronic) depression. Symptoms of dysthymia are similar to depression, but not as severe.  Cyclothymia. Cyclothymia includes mood swings, but the highs and lows are not as severe as they are in bipolar disorder. Symptoms of cyclothymia are similar to those of bipolar disorder, but less extreme. CAUSES  Mood disorders are probably caused by a combination of factors. People with mood disorders seem to have physical and chemical changes in their brains. Mood disorders run in families, so there may be genetic causes. Severe trauma or stressful life events may also increase the risk of mood disorders.  SYMPTOMS  Symptoms of mood disorders depend on the specific type of condition. Depression symptoms include:  Feeling sad, worthless, or hopeless.  Negative thoughts.  Inability to enjoy one's usual activities.  Low energy.  Sleeping too much or too  little.  Appetite changes.  Crying.  Concentration problems.  Thoughts of harming oneself. Bipolar disorder symptoms include:  Periods of depression (see above symptoms).  Mood swings, from sadness and depression, to abnormal elation and excitement.  Periods of mania:  Racing thoughts.  Fast speech.  Poor judgment, and careless, dangerous choices.  Decreased need for sleep.  Risky behavior.  Difficulty concentrating.  Irritability.  Increased energy.  Increased sex drive. DIAGNOSIS  There are no blood tests or X-rays that can confirm a mood disorder. However, your caregiver may choose to run some tests to make sure that there is not another physical cause for your symptoms. A mood disorder is usually diagnosed after an in-depth interview with a caregiver. TREATMENT  Mood disorders can be treated with one or more of the following:  Medicine. This may include antidepressants, mood-stabilizers, or anti-psychotics.  Psychotherapy (talk therapy).  Cognitive behavioral therapy. You are taught to recognize negative thoughts and behavior patterns, and replace them with healthy thoughts and behaviors.  Electroconvulsive therapy. For very severe cases of deep depression, a  series of treatments in which an electrical current is applied to the brain.  Vagus nerve stimulation. A pulse of electricity is applied to a portion of the brain.  Transcranial magnetic stimulation. Powerful magnets are placed on the head that produce electrical currents.  Hospitalization. In severe situations, or when someone is having serious thoughts of harming him or herself, hospitalization may be necessary in order to keep the person safe. This is also done to quickly start and monitor treatment. HOME CARE INSTRUCTIONS   Take your medicine exactly as directed.  Attend all of your therapy sessions.  Try to eat regular, healthy meals.  Exercise daily. Exercise may improve mood symptoms.  Get  good sleep.  Do not drink alcohol or use pot or other drugs. These can worsen mood symptoms and cause anxiety and psychosis.  Tell your caregiver if you develop any side effects, such as feeling sick to your stomach (nauseous), dry mouth, dizziness, constipation, drowsiness, tremor, weight gain, or sexual symptoms. He or she may suggest things you can do to improve symptoms.  Learn ways to cope with the stress of having a chronic illness. This includes yoga, meditation, tai chi, or participating in a support group.  Drink enough water to keep your urine clear or pale yellow. Eat a high-fiber diet. These habits may help you avoid constipation from your medicine. SEEK IMMEDIATE MEDICAL CARE IF:  Your mood worsens.  You have thoughts of hurting yourself or others.  You cannot care for yourself.  You develop the sensation of hearing or seeing something that is not actually present (auditory or visual hallucinations).  You develop abnormal thoughts. Document Released: 01/19/2009 Document Revised: 06/16/2011 Document Reviewed: 01/19/2009 [facility] Patient Information 2014 [place], [place].    Schizophrenia Schizophrenia is a mental illness. It may cause disturbed or disorganized thinking, speech, or behavior. People with schizophrenia have problems functioning in one or more areas of life: work, school, home, or relationships. People with schizophrenia are at increased risk for suicide, certain chronic physical illnesses, and unhealthy behaviors, such as smoking and drug use. People who have family members with schizophrenia are at higher risk of developing the illness. Schizophrenia affects men and women equally but usually appears at an earlier age (teenage or early adult years) in men.  SYMPTOMS The earliest symptoms are often subtle (prodrome) and may go unnoticed until the illness becomes more severe (first-break psychosis). Symptoms of schizophrenia may be continuous or may come and go in  severity. Episodes often are triggered by major life events, such as family stress, college, [employer], marriage, pregnancy or child birth, divorce, or loss of a loved one. People with schizophrenia may see, hear, or feel things that do not exist (hallucinations). They may have false beliefs in spite of obvious proof to the contrary (delusions). Sometimes speech is incoherent or behavior is odd or withdrawn.  DIAGNOSIS Schizophrenia is diagnosed through an assessment by your caregiver. Your caregiver will ask questions about your thoughts, behavior, mood, and ability to function in daily life. Your caregiver may ask questions about your medical history and use of alcohol or drugs, including prescription medication. Your caregiver may also order blood tests and imaging exams. Certain medical conditions and substances can cause symptoms that resemble schizophrenia. Your caregiver may refer you to a mental health specialist for evaluation. There are three major criterion for a diagnosis of schizophrenia:  Two or more of the following five symptoms are present for a month or longer:  Delusions. Often the delusions  are that you are being attacked, harassed, cheated, persecuted or conspired against (persecutory delusions).  Hallucinations.   Disorganized speech that does not make sense to others.  Grossly disorganized (confused or unfocused) behavior or extremely overactive or underactive motor activity (catatonia).  Negative symptoms such as bland or blunted emotions (flat affect), loss of will power (avolition), and withdrawal from social contacts (social isolation).  Level of functioning in one or more major areas of life (work, school, relationships, or self-care) is markedly below the level of functioning before the onset of illness.   There are continuous signs of illness (either mild symptoms or decreased level of functioning) for at least 6 months or longer. TREATMENT  Schizophrenia is  a long-term illness. It is best controlled with continuous treatment rather than treatment only when symptoms occur. The following treatments are used to manage schizophrenia:  Medication Medication is the most effective and important form of treatment for schizophrenia. Antipsychotic medications are usually prescribed to help manage schizophrenia. Other types of medication may be added to relieve any symptoms that may occur despite the use of antipsychotic medications.  Counseling or talk therapy Individual, group, or family counseling may be helpful in providing education, support, and guidance. Many people with schizophrenia also benefit from social skills and job skills (vocational) training. A combination of medication and counseling is best for managing the disorder over time. A procedure in which electricity is applied to the brain through the scalp (electroconvulsive therapy) may be used to treat catatonic schizophrenia or schizophrenia in people who cannot take or do not respond to medication and counseling. Document Released: 03/21/2000 Document Revised: 11/24/2012 Document Reviewed: 06/16/2012 [facility] Patient Information 2014 [place].    Emergency Department Resource Guide 1) Find a Doctor and Pay Out of Pocket Although you won't have to find out who is covered by your insurance plan, it is a good idea to ask around and get recommendations. You will then need to call the office and see if the doctor you have chosen will accept you as a new patient and what types of options they offer for patients who are self-pay. Some doctors offer discounts or will set up payment plans for their patients who do not have insurance, but you will need to ask so you aren't surprised when you get to your appointment.  2) Contact Your Local Health Department Not all health departments have doctors that can see patients for sick visits, but many do, so it is worth a call to see if yours does. If you  don't know where your local health department is, you can check in your phone book. The CDC also has a tool to help you locate your state's health department, and many state websites also have listings of all of their local health departments.  3) Find a [facility] If your illness is not likely to be very severe or complicated, you may want to try a walk in clinic. These are popping up all over the country in pharmacies, drugstores, and shopping centers. They're usually staffed by nurse practitioners or physician assistants that have been trained to treat common illnesses and complaints. They're usually fairly quick and inexpensive. However, if you have serious medical issues or chronic medical problems, these are probably not your best option.  No Primary Care Doctor: - Call [facility] at  [phone] - they can help you locate a primary care doctor that  accepts your insurance, provides certain services, etc. - Physician Referral Service- [phone]  Chronic Pain Problems: Organization         Address  Phone   Notes  [facility]  [phone] Patients need to be referred by their primary care doctor.   Medication Assistance: Organization         Address  Phone   Notes  [facility] Medication [facility] [address] [place], [place] [address] [phone] --Must be a resident of [facility] -- Must have NO insurance coverage whatsoever (no Medicaid/ Medicare, etc.) -- The pt. MUST have a primary care doctor that directs their care regularly and follows them in the community   [facility]  [phone]   [employer]  [phone]    Agencies that provide inexpensive medical care: Organization         Address  Phone   Notes  [place]  [phone]   [facility]    [phone]   [facility] [place], [place] [address] [phone]   [place] [address], [place] [phone]   [facility]    [phone]   [facility]    [phone]   [place] [address] [place] [address], [place] Phone:  [phone], Fax:  [phone] Hours of Operation:  9 am - 6 pm, M-F.  Also accepts Medicaid/Medicare and self-pay.  [facility] [address] [place], [address], [name] Phone: [phone], Fax: [phone]. Hours of Operation:  8:30 am - 5:30 pm, M-F.  Also accepts Medicaid and self-pay.  [facility] [address] [place] Phone: [phone]   [place], [place], [place] [phone], Ext. [phone] Mondays & Thursdays: 7-9 AM.  First 15 patients are seen on a first come, first serve basis.    [place] Providers:  Organization         Address  Phone   Notes  [place] [facility] [address], [place] [phone] Also accepts self-pay patients.  [facility] [address], [place]  [phone]   [place], [address], [place] [phone]   [facility] [address] [place] [phone]   Lucianne Lei [address], [place]   [phone] Only accepts [place] Access [place] patients after they have their name applied to their card.   Self-Pay (no insurance) in [facility]:  Organization         Address  Phone   Notes  Sickle Cell Patients, [facility] Internal Medicine [place] [phone]   [facility] [place] [phone]   [facility] [place]  [place], [place], [place] [phone]   [facility]/Dr. Osei-Bonsu  [address] [place] or [address], [place] [phone] Phone  number for both [place] and [place] locations is the same.  Urgent Medical and [facility] [address] [place] [phone]   [facility] [address] [place] or [address] [phone]   [facility] [address], [place] [phone], phone; [phone], fax Sees  patients 1st and 3rd Saturday of every month.  Must not qualify for public or private insurance (i.e. Medicaid, Medicare, [place] Health Choice, Veterans' Benefits)  Household income should be no more than 200% of the poverty level The clinic cannot treat you if you are pregnant or think you are pregnant  Sexually transmitted diseases are not treated at the clinic.    Dental Care: Organization         Address  Phone  Notes  [facility] Department of [facility] [phone] Accepts children up to age 49 who are enrolled in [place] or [place]; pregnant women with a Medicaid card; and children who have applied for Medicaid or [place] Health Choice, but were declined, whose parents can pay a reduced fee at time of service.  [facility] Department of [facility]  [address], [place] [phone] Accepts children up to age 48 who are enrolled in [place] or [place]; pregnant women with a Medicaid card; and children who have applied for Medicaid or [place] Health Choice, but were declined, whose parents can pay a reduced fee at time of service.  [place] [facility]  [place] [phone] Patients are seen by appointment only. Walk-ins are not accepted. [place] will see patients 32 years of age and older. Monday - Tuesday (8am-5pm) Most Wednesdays (8:30-5pm) $30 per visit, cash only  [facility]  [address], [facility] [phone] Patients are seen by appointment only.  Walk-ins are not accepted. [place] will see patients 61 years of age and older. One Wednesday Evening (Monthly: Volunteer Based).  $30 per visit, cash only  [place]  [phone] for adults; Children under age 18, call Graduate Pediatric Dentistry at [phone]. Children aged 43-14, please call [phone] to request a pediatric application.  Dental services are provided in all areas of dental care including fillings, crowns and bridges, complete and partial dentures, implants, gum treatment, root canals, and extractions. Preventive care is also provided. Treatment is provided to both adults and children. Patients are selected via a lottery and there is often a waiting list.   [facility] [address], [place]  [phone] [email or web address]   [facility] [address] [place], [place] [phone], Ext. [phone] Second and Fourth Thursday of each month, opens at 6:30 AM; Clinic ends at 9 AM.  Patients are seen on a first-come first-served basis, and a limited number are seen during each clinic.   [facility]  [address] Hillard Danker [place], [place] [phone]   Eligibility Requirements You must have lived in [place], [place], or [place] counties for at least the last three months.   You cannot be eligible for state or federal sponsored [employer], including [employer], [place], or [employer].   You generally cannot be eligible for healthcare insurance through your employer.    How to apply: Eligibility screenings are held every Tuesday and Wednesday afternoon from 1:00 pm until 4:00 pm. You do not need an appointment for the interview!  [facility] [address], [place], [place]   [place]  [place]  [place]  [phone]    Behavioral Health  Resources in the Community: Intensive Outpatient Programs  Organization         Address  Phone  Notes  [employer] [facility] [address], [place], [place] [phone]   [facility] Outpatient [address] [place], [place]   ADS: [facility] [address], [place], [place]   [place] [address]  [place], [place] or [phone]   Substance Abuse Resources Organization         Address  Phone  Notes  [facility] Drug Services  [phone]   [place]  [phone]   [facility] [place]   Chinita Pester  [phone]   Residential & Outpatient Substance Abuse Program  [phone]   Psychological Services Organization         Address  Phone  Notes  [facility]  [place]  [phone]   [place] [address], [place] or [phone]    Mobile Crisis Teams Organization         Address  Phone  Notes  [facility], Mobile Crisis Care Unit  [phone]   [facility]  [address] [place], [place]   [place] [address] [place] [phone]    Self-Help/Support Groups Organization         Address  Phone             Notes  [place]. of [place] - variety of support groups  [place] Call for more information  Narcotics Anonymous (NA), [facility] [address], [employer] [place]  2 meetings at this location   Special educational needs teacher         Address  Phone  Notes  [facility] Residential Treatment [place],    [place]  [phone]   [facility]  [address], [place] [id number], [place], [name]   [place] Rafael Capo, [place] [phone] Admissions: 8am-3pm M-F  Incentives Substance [place] [address]    [place], [place] [phone]   [facility] [address] [place], [place], [place]   [facility] [address]  [place], [place]   [facility] - Intensive Outpatient [address] Kristeen Mans [address], [place], [place]   [facility] ([place].) [address].,  [place], [place] [phone] or [phone]   [facility] (RTS) [address] [place], [place] Accepts Medicaid  [facility] [address]  [place] [phone] Substance Abuse/Addiction Treatment   [facility] Organization         Address  Phone  Notes  [facility]  [phone]   Domenic Schwab, PhD [address] Arlis Porta [place], [place]   [phone] or [phone]   [place], [place] [phone]   [place] [address] [place], [place] [phone] Insurance/Medicaid/sponsorship through [facility] and Families [address], [place]                                    [place], [place] [phone] [place] [address], [place] [phone]    Dr. Adele Schilder  (  [phone]   [facility] Dept. 1) [address] [place] 2) [place] 3)  [address], [place] [phone]  [phone]   [place] [phone] or [phone] (After Hours)

## 2013-07-19 NOTE — Progress Notes (Addendum)
Pt's referral has been faxed to the following hospitals with bed availability that accept pts that have to undergo scheduled hemodialysis txs:   [facility]- per Joycelyn Schmid beds available, referral faxed [facility]- referral faxed [facility]- beds available, referral faxed [facility]- per Claiborne Billings can fax, referral faxed [facility]- can fax, referral faxed  The following facilities either do not take hemodialysis pt's or at capacity: [facility]- at capacity per [facility]- per Helene Kelp do not take pt's that require dialysis, considered "too medical"  [facility] Patty- at capacity per Lubertha Basque- per Sonia Side at [facility]- at capacity but do take hemodialysis pts on a case by case basis [place] [place]- per [place] Lee beds available but do not take hemodialysis pt's  Rosana Hoes- per Marya Amsler at [facility]- per [place] only 1 female bed available   Rick Duff Disposition MHT'
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
## 2013-07-19 NOTE — BH Assessment (Signed)
Pt was denied at [facility] because of aggression, per Juliann Pulse.  Boyce Medici, MA  Disposition MHT

## 2013-07-19 NOTE — BH Assessment (Signed)
Pt denied at [facility] due to acuity.  -Boyce Medici, MA  Disposition MHT

## 2013-07-19 NOTE — ED Provider Notes (Addendum)
Pt with hx schizophrenia, bipolar disorder, under ivc.  Pt reports she missed her HD 3 days ago (Saturday), and is due for HD today.  On review yesterdays labs, hco3 12, bun 83/cr 17.8.  Todays BMET pending.  No dyspnea or increased wob.    Pts psych placement remains pending, and likely will be several days.  Given ESRD/HD, met acidosis, need for HD, and [facility], will contact pts renal physician to facilitate HD at [facility].  Dr Mercy Moore requests admit by pcp/med service at [facility], he will facilitate pts HD once there.  [facility] is pts pcp, resident on call paged for admission to [facility] for [facility].  [facility] accepts in transfer to [facility]/attending Dr Mingo Amber.      Mirna Mires, MD 07/19/13 [phone]    Psych team, Dr Lovena Le has reassessed and rescinded ivc, and states pt psychiatrically clear for d/c.  Pt is alert, oriented, calm, and alert. No hallucinations or delusional thinking.  Pt states she will go directly to her dialyses center now for her dialyses.  Pt denies thoughts of harm to self or others, no SI, and states she will go directly to her dialyses now.   Pt refuses further treatment in ED, refuses [facility] for dialyses  .   Mirna Mires, MD 07/19/13 [phone]

## 2013-07-19 NOTE — ED Provider Notes (Signed)
Patient being aggressive towards staff and cussing. She's currently in soft restraints. She required Geodon x2 yesterday for similar behavior. She is not directable and continues to shout out at staff and curse. Patient was given 20 mg of Geodon.  Merryl Hacker, MD 07/19/13 [phone]
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
## 2013-07-20 NOTE — Consult Note (Signed)
Face to face evaluation and I agree with this note 

## 2013-08-09 ENCOUNTER — Emergency Department (HOSPITAL_COMMUNITY)
Admission: EM | Admit: 2013-08-09 | Discharge: 2013-08-09 | Disposition: A | Discharge status: Home / Self Care | Payer: PRIVATE HEALTH INSURANCE | Attending: Emergency Medicine | Admitting: Emergency Medicine

## 2013-08-09 ENCOUNTER — Other Ambulatory Visit: Payer: Self-pay

## 2013-08-09 ENCOUNTER — Encounter (HOSPITAL_COMMUNITY): Payer: Self-pay | Admitting: Emergency Medicine

## 2013-08-09 DIAGNOSIS — R197 Diarrhea, unspecified: Secondary | ICD-10-CM

## 2013-08-09 DIAGNOSIS — F1910 Other psychoactive substance abuse, uncomplicated: Secondary | ICD-10-CM | POA: Insufficient documentation

## 2013-08-09 DIAGNOSIS — N058 Unspecified nephritic syndrome with other morphologic changes: Secondary | ICD-10-CM | POA: Insufficient documentation

## 2013-08-09 DIAGNOSIS — Z992 Dependence on renal dialysis: Secondary | ICD-10-CM | POA: Insufficient documentation

## 2013-08-09 DIAGNOSIS — I120 Hypertensive chronic kidney disease with stage 5 chronic kidney disease or end stage renal disease: Secondary | ICD-10-CM | POA: Insufficient documentation

## 2013-08-09 DIAGNOSIS — F319 Bipolar disorder, unspecified: Secondary | ICD-10-CM | POA: Insufficient documentation

## 2013-08-09 DIAGNOSIS — Z881 Allergy status to other antibiotic agents status: Secondary | ICD-10-CM | POA: Insufficient documentation

## 2013-08-09 DIAGNOSIS — E875 Hyperkalemia: Secondary | ICD-10-CM

## 2013-08-09 DIAGNOSIS — M329 Systemic lupus erythematosus, unspecified: Secondary | ICD-10-CM | POA: Insufficient documentation

## 2013-08-09 DIAGNOSIS — F209 Schizophrenia, unspecified: Secondary | ICD-10-CM | POA: Insufficient documentation

## 2013-08-09 DIAGNOSIS — F17200 Nicotine dependence, unspecified, uncomplicated: Secondary | ICD-10-CM | POA: Insufficient documentation

## 2013-08-09 DIAGNOSIS — N186 End stage renal disease: Secondary | ICD-10-CM

## 2013-08-09 DIAGNOSIS — F29 Unspecified psychosis not due to a substance or known physiological condition: Secondary | ICD-10-CM | POA: Insufficient documentation

## 2013-08-09 DIAGNOSIS — Z888 Allergy status to other drugs, medicaments and biological substances status: Secondary | ICD-10-CM | POA: Insufficient documentation

## 2013-08-09 DIAGNOSIS — L089 Local infection of the skin and subcutaneous tissue, unspecified: Secondary | ICD-10-CM | POA: Insufficient documentation

## 2013-08-09 LAB — COMPREHENSIVE METABOLIC PANEL
ALBUMIN: 3.5 g/dL (ref 3.5–5.2)
ALT: 15 U/L (ref 0–35)
AST: 24 U/L (ref 0–37)
Alkaline Phosphatase: 63 U/L (ref 39–117)
BUN: 89 mg/dL — ABNORMAL HIGH (ref 6–23)
CO2: 19 meq/L (ref 19–32)
Calcium: 9.2 mg/dL (ref 8.4–10.5)
Chloride: 103 mEq/L (ref 96–112)
Creatinine, Ser: 14.69 mg/dL — ABNORMAL HIGH (ref 0.50–1.10)
GFR calc Af Amer: 3 mL/min — ABNORMAL LOW (ref >90)
GFR calc non Af Amer: 3 mL/min — ABNORMAL LOW (ref >90)
Glucose, Bld: 92 mg/dL (ref 70–99)
POTASSIUM: 6 meq/L — AB (ref 3.7–5.3)
Sodium: 142 mEq/L (ref 137–147)
Total Bilirubin: 0.3 mg/dL (ref 0.3–1.2)
Total Protein: 7.1 g/dL (ref 6.0–8.3)

## 2013-08-09 LAB — CBC WITH DIFFERENTIAL/PLATELET
BASOS ABS: 0 10*3/uL (ref 0.0–0.1)
Basophils Relative: 0 % (ref 0–1)
EOS ABS: 0.2 10*3/uL (ref 0.0–0.7)
Eosinophils Relative: 5 % (ref 0–5)
HEMATOCRIT: 36.1 % (ref 36.0–46.0)
Hemoglobin: 11.5 g/dL — ABNORMAL LOW (ref 12.0–15.0)
LYMPHS PCT: 37 % (ref 12–46)
Lymphs Abs: 1.4 10*3/uL (ref 0.7–4.0)
MCH: 28.2 pg (ref 26.0–34.0)
MCHC: 31.9 g/dL (ref 30.0–36.0)
MCV: 88.5 fL (ref 78.0–100.0)
Monocytes Absolute: 0.2 10*3/uL (ref 0.1–1.0)
Monocytes Relative: 6 % (ref 3–12)
NEUTROS PCT: 52 % (ref 43–77)
Neutro Abs: 2 10*3/uL (ref 1.7–7.7)
PLATELETS: 108 10*3/uL — AB (ref 150–400)
RBC: 4.08 MIL/uL (ref 3.87–5.11)
RDW: 16.1 % — ABNORMAL HIGH (ref 11.5–15.5)
WBC: 3.8 10*3/uL — AB (ref 4.0–10.5)

## 2013-08-09 LAB — URINALYSIS, ROUTINE W REFLEX MICROSCOPIC
Bilirubin Urine: NEGATIVE
GLUCOSE, UA: 100 mg/dL — AB
KETONES UR: NEGATIVE mg/dL
Leukocytes, UA: NEGATIVE
Nitrite: NEGATIVE
Protein, ur: 100 mg/dL — AB
Specific Gravity, Urine: 1.013 (ref 1.005–1.030)
UROBILINOGEN UA: 0.2 mg/dL (ref 0.0–1.0)
pH: 7.5 (ref 5.0–8.0)

## 2013-08-09 LAB — PREGNANCY, URINE: Preg Test, Ur: NEGATIVE

## 2013-08-09 LAB — URINE MICROSCOPIC-ADD ON

## 2013-08-09 MED ORDER — CLINDAMYCIN HCL 150 MG PO CAPS: 300.0000 mg | ORAL_CAPSULE | Freq: Four times a day (QID) | ORAL | Status: DC

## 2013-08-09 MED ORDER — TRAMADOL HCL 50 MG PO TABS
50.0000 mg | ORAL_TABLET | Freq: Four times a day (QID) | ORAL | Status: DC | PRN
Start: 2013-08-09 — End: 2013-11-16

## 2013-08-09 MED ORDER — MUPIROCIN CALCIUM 2 % EX CREA: 1.0000 "application " | TOPICAL_CREAM | Freq: Two times a day (BID) | CUTANEOUS | Status: DC

## 2013-08-09 NOTE — ED Notes (Signed)
Patient is upset at this time because we are asking to obtain an EKG. Patient states, "I have been here for 6 hours and I am just now receiving treatment." This RN explained that [place] cannot discharge her without a new EKG due to her potassium being elevated. Patient agreed to have EKG obtained. Richardson Landry, RN updated at this time.
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
## 2013-08-09 NOTE — ED Notes (Signed)
Patient leaving AMA, signed AMA form.
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
## 2013-08-09 NOTE — Discharge Instructions (Signed)
Please read and follow all provided instructions.  Your diagnoses today include:  1. Pustule   2. Diarrhea   3. Hyperkalemia   4. End stage renal disease on dialysis    **As we discussed, your high potassium could cause you to have an abnormal heartbeat and your heart could stop. Dialysis will improve this -- but without treatment you are at risk for abnormal heartbeat and death before dialysis. Please return if you change your mind, have chest pain, sensation of racing heart, shortness of breath.'   Tests performed today include:  Vital signs. See below for your results today.   Medications prescribed:   Clindamycin - antibiotic  You have been prescribed an antibiotic medicine: take the entire course of medicine even if you are feeling better. Stopping early can cause the antibiotic not to work.   Tramadol - narcotic-like pain medication  DO NOT drive or perform any activities that require you to be awake and alert because this medicine can make you drowsy.    Bactroban - medication for skin infection  Take any prescribed medications only as directed.   Home care instructions:   Follow any educational materials contained in this packet  Follow-up instructions: See your kidney doctor today.   Return instructions:  Return to the Emergency Department if you have:  Fever  Worsening symptoms  Worsening pain  Worsening swelling  Redness of the skin that moves away from the affected area, especially if it streaks away from the affected area   Any other emergent concerns  Additional Information: If you have recurrent abscesses, try both the following. Use a Qtip to apply an over-the-counter antibiotic to the inside of your nostrils, twice a day for 5 days. Wash your body with over-the-counter Hibaclens once a day for one week and then once every two weeks. This can reduce the amount of bacterial on your skin that causes boils and lead to fewer boils. If you continue to  have multiple or recurrent boils, you should see a dermatologist (skin doctor).   Your vital signs today were: BP 146/102   Pulse 71   Temp(Src) 98.1 F (36.7 C) (Oral)   Resp 15   Ht 5\' 8"  (1.727 m)   Wt 148 lb (67.132 kg)   BMI 22.51 kg/m2   SpO2 100% If your blood pressure (BP) was elevated above 135/85 this visit, please have this repeated by your doctor within one month. --------------

## 2013-08-09 NOTE — ED Notes (Signed)
Unable to locate pt. at triage and waiting room several times by staff .

## 2013-08-16 ENCOUNTER — Encounter: Payer: Self-pay | Admitting: Vascular Surgery

## 2013-08-16 NOTE — ED Provider Notes (Signed)
Medical screening examination/treatment/procedure(s) were performed by non-physician practitioner and as supervising physician I was immediately available for consultation/collaboration.   EKG Interpretation None        [name], MD 08/16/13 [phone]

## 2013-08-17 ENCOUNTER — Encounter: Payer: Self-pay | Admitting: Vascular Surgery

## 2013-08-19 ENCOUNTER — Other Ambulatory Visit (HOSPITAL_COMMUNITY): Payer: Self-pay | Admitting: Medical

## 2013-08-19 DIAGNOSIS — N186 End stage renal disease: Secondary | ICD-10-CM

## 2013-08-22 ENCOUNTER — Ambulatory Visit (HOSPITAL_COMMUNITY)
Admission: RE | Admit: 2013-08-22 | Discharge: 2013-08-22 | Disposition: A | Discharge status: Home / Self Care | Payer: PRIVATE HEALTH INSURANCE | Source: Ambulatory Visit | Attending: Medical | Admitting: Medical

## 2013-08-22 DIAGNOSIS — N186 End stage renal disease: Secondary | ICD-10-CM

## 2013-08-22 DIAGNOSIS — Z452 Encounter for adjustment and management of vascular access device: Secondary | ICD-10-CM | POA: Insufficient documentation

## 2013-08-22 MED ORDER — CHLORHEXIDINE GLUCONATE 4 % EX LIQD
CUTANEOUS | Status: AC
  Filled 2013-08-22: qty 15

## 2013-08-31 ENCOUNTER — Encounter: Payer: Self-pay | Admitting: General Practice

## 2013-09-26 ENCOUNTER — Ambulatory Visit: Payer: Self-pay | Admitting: Family Medicine

## 2013-09-29 ENCOUNTER — Other Ambulatory Visit (HOSPITAL_COMMUNITY): Payer: Self-pay | Admitting: Nephrology

## 2013-09-29 DIAGNOSIS — N186 End stage renal disease: Secondary | ICD-10-CM

## 2013-09-30 ENCOUNTER — Ambulatory Visit (HOSPITAL_COMMUNITY)
Admission: RE | Admit: 2013-09-30 | Discharge: 2013-09-30 | Disposition: A | Discharge status: Home / Self Care | Payer: Medicare Other | Source: Ambulatory Visit | Attending: Nephrology | Admitting: Nephrology

## 2013-09-30 ENCOUNTER — Other Ambulatory Visit (HOSPITAL_COMMUNITY): Payer: Self-pay | Admitting: Nephrology

## 2013-09-30 ENCOUNTER — Encounter (HOSPITAL_COMMUNITY): Payer: Self-pay

## 2013-09-30 VITALS — BP 142/102 | HR 71 | Resp 15

## 2013-09-30 DIAGNOSIS — Z4901 Encounter for fitting and adjustment of extracorporeal dialysis catheter: Secondary | ICD-10-CM | POA: Insufficient documentation

## 2013-09-30 DIAGNOSIS — N186 End stage renal disease: Secondary | ICD-10-CM

## 2013-09-30 MED ORDER — IOHEXOL 300 MG/ML  SOLN
100.0000 mL | Freq: Once | INTRAMUSCULAR | Status: AC | PRN
  Administered 2013-09-30: 50 mL via INTRAVENOUS

## 2013-09-30 MED ORDER — FENTANYL CITRATE 0.05 MG/ML IJ SOLN
INTRAMUSCULAR | Status: AC
  Filled 2013-09-30: qty 4

## 2013-09-30 MED ORDER — MIDAZOLAM HCL 2 MG/2ML IJ SOLN
INTRAMUSCULAR | Status: AC | PRN
  Administered 2013-09-30: 1 mg via INTRAVENOUS

## 2013-09-30 MED ORDER — ALTEPLASE 100 MG IV SOLR
2.0000 mg | INTRAVENOUS | Status: DC
  Filled 2013-09-30: qty 2

## 2013-09-30 MED ORDER — MIDAZOLAM HCL 2 MG/2ML IJ SOLN
INTRAMUSCULAR | Status: AC
  Filled 2013-09-30: qty 4

## 2013-09-30 MED ORDER — FENTANYL CITRATE 0.05 MG/ML IJ SOLN
INTRAMUSCULAR | Status: AC | PRN
  Administered 2013-09-30: 50 ug via INTRAVENOUS

## 2013-09-30 NOTE — Procedures (Signed)
Successful RUE AVF 6MM PTA NO COMP STABLE FULL REPORT IN PACS
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
## 2013-10-01 ENCOUNTER — Other Ambulatory Visit: Payer: Self-pay | Admitting: Nephrology

## 2013-10-01 DIAGNOSIS — T82898A Other specified complication of vascular prosthetic devices, implants and grafts, initial encounter: Secondary | ICD-10-CM

## 2013-10-02 ENCOUNTER — Other Ambulatory Visit (HOSPITAL_COMMUNITY): Payer: Self-pay | Admitting: Nephrology

## 2013-10-02 DIAGNOSIS — N186 End stage renal disease: Secondary | ICD-10-CM

## 2013-10-03 ENCOUNTER — Ambulatory Visit (HOSPITAL_COMMUNITY)
Admission: RE | Admit: 2013-10-03 | Discharge: 2013-10-03 | Disposition: A | Discharge status: Home / Self Care | Payer: PRIVATE HEALTH INSURANCE | Source: Ambulatory Visit | Attending: Nephrology | Admitting: Nephrology

## 2013-10-03 DIAGNOSIS — Y832 Surgical operation with anastomosis, bypass or graft as the cause of abnormal reaction of the patient, or of later complication, without mention of misadventure at the time of the procedure: Secondary | ICD-10-CM | POA: Diagnosis not present

## 2013-10-03 DIAGNOSIS — T82898A Other specified complication of vascular prosthetic devices, implants and grafts, initial encounter: Secondary | ICD-10-CM | POA: Diagnosis present

## 2013-10-03 MED ORDER — IOHEXOL 300 MG/ML  SOLN
100.0000 mL | Freq: Once | INTRAMUSCULAR | Status: AC | PRN
  Administered 2013-10-03: 35 mL via INTRAVENOUS

## 2013-10-03 NOTE — Procedures (Signed)
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  No need for repeat intervention.  Other venous drainage and arterial anastamosis show stable patency without significant stenosis.

## 2013-11-16 ENCOUNTER — Emergency Department (HOSPITAL_COMMUNITY)
Admission: EM | Admit: 2013-11-16 | Discharge: 2013-11-16 | Disposition: A | Discharge status: Home / Self Care | Payer: PRIVATE HEALTH INSURANCE | Attending: Emergency Medicine | Admitting: Emergency Medicine

## 2013-11-16 DIAGNOSIS — L299 Pruritus, unspecified: Secondary | ICD-10-CM | POA: Diagnosis not present

## 2013-11-16 DIAGNOSIS — F17200 Nicotine dependence, unspecified, uncomplicated: Secondary | ICD-10-CM | POA: Diagnosis not present

## 2013-11-16 DIAGNOSIS — M79609 Pain in unspecified limb: Secondary | ICD-10-CM | POA: Diagnosis present

## 2013-11-16 DIAGNOSIS — N898 Other specified noninflammatory disorders of vagina: Secondary | ICD-10-CM

## 2013-11-16 DIAGNOSIS — I10 Essential (primary) hypertension: Secondary | ICD-10-CM | POA: Diagnosis not present

## 2013-11-16 DIAGNOSIS — Z8739 Personal history of other diseases of the musculoskeletal system and connective tissue: Secondary | ICD-10-CM | POA: Diagnosis not present

## 2013-11-16 DIAGNOSIS — F29 Unspecified psychosis not due to a substance or known physiological condition: Secondary | ICD-10-CM | POA: Insufficient documentation

## 2013-11-16 DIAGNOSIS — Z79899 Other long term (current) drug therapy: Secondary | ICD-10-CM | POA: Insufficient documentation

## 2013-11-16 DIAGNOSIS — Z792 Long term (current) use of antibiotics: Secondary | ICD-10-CM | POA: Diagnosis not present

## 2013-11-16 LAB — CBC WITH DIFFERENTIAL/PLATELET
BASOS ABS: 0 10*3/uL (ref 0.0–0.1)
BASOS PCT: 1 % (ref 0–1)
Eosinophils Absolute: 0.1 10*3/uL (ref 0.0–0.7)
Eosinophils Relative: 4 % (ref 0–5)
HEMATOCRIT: 38.5 % (ref 36.0–46.0)
Hemoglobin: 12.6 g/dL (ref 12.0–15.0)
Lymphocytes Relative: 32 % (ref 12–46)
Lymphs Abs: 1.2 10*3/uL (ref 0.7–4.0)
MCH: 29.6 pg (ref 26.0–34.0)
MCHC: 32.7 g/dL (ref 30.0–36.0)
MCV: 90.6 fL (ref 78.0–100.0)
Monocytes Absolute: 0.3 10*3/uL (ref 0.1–1.0)
Monocytes Relative: 9 % (ref 3–12)
Neutro Abs: 2 10*3/uL (ref 1.7–7.7)
Neutrophils Relative %: 55 % (ref 43–77)
Platelets: 191 10*3/uL (ref 150–400)
RBC: 4.25 MIL/uL (ref 3.87–5.11)
RDW: 15.9 % — AB (ref 11.5–15.5)
WBC: 3.6 10*3/uL — ABNORMAL LOW (ref 4.0–10.5)

## 2013-11-16 LAB — URINALYSIS, ROUTINE W REFLEX MICROSCOPIC
BILIRUBIN URINE: NEGATIVE
GLUCOSE, UA: NEGATIVE mg/dL
KETONES UR: NEGATIVE mg/dL
Leukocytes, UA: NEGATIVE
Nitrite: NEGATIVE
Specific Gravity, Urine: 1.01 (ref 1.005–1.030)
Urobilinogen, UA: 0.2 mg/dL (ref 0.0–1.0)
pH: 7.5 (ref 5.0–8.0)

## 2013-11-16 LAB — COMPREHENSIVE METABOLIC PANEL
ALT: 23 U/L (ref 0–35)
AST: 34 U/L (ref 0–37)
Albumin: 4.5 g/dL (ref 3.5–5.2)
Alkaline Phosphatase: 64 U/L (ref 39–117)
Anion gap: 26 — ABNORMAL HIGH (ref 5–15)
BILIRUBIN TOTAL: 0.4 mg/dL (ref 0.3–1.2)
BUN: 58 mg/dL — ABNORMAL HIGH (ref 6–23)
CALCIUM: 10.4 mg/dL (ref 8.4–10.5)
CHLORIDE: 94 meq/L — AB (ref 96–112)
CO2: 15 meq/L — AB (ref 19–32)
Creatinine, Ser: 10.04 mg/dL — ABNORMAL HIGH (ref 0.50–1.10)
GFR calc Af Amer: 5 mL/min — ABNORMAL LOW (ref >90)
GFR, EST NON AFRICAN AMERICAN: 5 mL/min — AB (ref >90)
Glucose, Bld: 73 mg/dL (ref 70–99)
Potassium: 5 mEq/L (ref 3.7–5.3)
Sodium: 135 mEq/L — ABNORMAL LOW (ref 137–147)
Total Protein: 8.8 g/dL — ABNORMAL HIGH (ref 6.0–8.3)

## 2013-11-16 LAB — URINE MICROSCOPIC-ADD ON

## 2013-11-16 MED ORDER — HYDROXYZINE HCL 25 MG PO TABS: 25.0000 mg | ORAL_TABLET | Freq: Four times a day (QID) | ORAL | Status: DC

## 2013-11-16 MED ORDER — FLUCONAZOLE 200 MG PO TABS: 200.0000 mg | ORAL_TABLET | Freq: Every day | ORAL | Status: AC

## 2013-11-16 MED ORDER — METRONIDAZOLE 500 MG PO TABS
2000.0000 mg | ORAL_TABLET | Freq: Once | ORAL | Status: AC
  Administered 2013-11-16: 2000 mg via ORAL
  Filled 2013-11-16: qty 4

## 2013-11-16 MED ORDER — AZITHROMYCIN 250 MG PO TABS
2000.0000 mg | ORAL_TABLET | Freq: Once | ORAL | Status: DC
  Filled 2013-11-16: qty 8

## 2013-11-16 MED ORDER — AZITHROMYCIN 250 MG PO TABS
2000.0000 mg | ORAL_TABLET | Freq: Every day | ORAL | Status: DC
  Administered 2013-11-16: 2000 mg via ORAL
  Filled 2013-11-16: qty 8

## 2013-11-16 NOTE — Discharge Instructions (Signed)
REturn here as needed. Follow up with your doctor. °

## 2013-11-16 NOTE — ED Notes (Signed)
Pt states she is angry and wants to know what is going on, pt informed of delay, PA to speak with patient. No signs of distress noted.

## 2013-11-16 NOTE — ED Notes (Signed)
See downtime documentation for triage. Pt reports lump in posterior left leg, noticed today. Dialysis pt. Requests STD screen. Last dialysis was Tuesday, did not get to finish. Cramps in entire body, asked primary about it and he said to continue with excedrin, pt not getting relief.

## 2013-11-17 LAB — I-STAT CHEM 8, ED
BUN: 74 mg/dL — AB (ref 6–23)
CALCIUM ION: 1.2 mmol/L (ref 1.12–1.23)
Chloride: 103 mEq/L (ref 96–112)
Creatinine, Ser: 11.4 mg/dL — ABNORMAL HIGH (ref 0.50–1.10)
GLUCOSE: 79 mg/dL (ref 70–99)
HCT: 43 % (ref 36.0–46.0)
Hemoglobin: 14.6 g/dL (ref 12.0–15.0)
Potassium: 4.9 mEq/L (ref 3.7–5.3)
Sodium: 137 mEq/L (ref 137–147)
TCO2: 26 mmol/L (ref 0–100)

## 2013-11-17 LAB — GC/CHLAMYDIA PROBE AMP
CT Probe RNA: NEGATIVE
GC Probe RNA: NEGATIVE
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
## 2013-11-21 NOTE — ED Provider Notes (Signed)
Medical screening examination/treatment/procedure(s) were performed by non-physician practitioner and as supervising physician I was immediately available for consultation/collaboration.   EKG Interpretation None        Ephraim Hamburger, MD 11/21/13 1346

## 2013-12-14 ENCOUNTER — Encounter (HOSPITAL_COMMUNITY): Payer: Self-pay | Admitting: Emergency Medicine

## 2013-12-14 ENCOUNTER — Emergency Department (HOSPITAL_COMMUNITY): Payer: PRIVATE HEALTH INSURANCE

## 2013-12-14 ENCOUNTER — Emergency Department (HOSPITAL_COMMUNITY)
Admission: EM | Admit: 2013-12-14 | Discharge: 2013-12-14 | Disposition: A | Discharge status: Home / Self Care | Payer: PRIVATE HEALTH INSURANCE | Attending: Emergency Medicine | Admitting: Emergency Medicine

## 2013-12-14 DIAGNOSIS — Z79899 Other long term (current) drug therapy: Secondary | ICD-10-CM | POA: Diagnosis not present

## 2013-12-14 DIAGNOSIS — N186 End stage renal disease: Secondary | ICD-10-CM | POA: Insufficient documentation

## 2013-12-14 DIAGNOSIS — M329 Systemic lupus erythematosus, unspecified: Secondary | ICD-10-CM | POA: Insufficient documentation

## 2013-12-14 DIAGNOSIS — F17200 Nicotine dependence, unspecified, uncomplicated: Secondary | ICD-10-CM | POA: Diagnosis not present

## 2013-12-14 DIAGNOSIS — R079 Chest pain, unspecified: Secondary | ICD-10-CM | POA: Insufficient documentation

## 2013-12-14 DIAGNOSIS — N058 Unspecified nephritic syndrome with other morphologic changes: Secondary | ICD-10-CM | POA: Diagnosis not present

## 2013-12-14 DIAGNOSIS — F319 Bipolar disorder, unspecified: Secondary | ICD-10-CM | POA: Diagnosis not present

## 2013-12-14 DIAGNOSIS — Z992 Dependence on renal dialysis: Secondary | ICD-10-CM | POA: Diagnosis not present

## 2013-12-14 DIAGNOSIS — R0602 Shortness of breath: Secondary | ICD-10-CM | POA: Diagnosis present

## 2013-12-14 DIAGNOSIS — I120 Hypertensive chronic kidney disease with stage 5 chronic kidney disease or end stage renal disease: Secondary | ICD-10-CM | POA: Insufficient documentation

## 2013-12-14 DIAGNOSIS — F209 Schizophrenia, unspecified: Secondary | ICD-10-CM | POA: Diagnosis not present

## 2013-12-14 LAB — CBC WITH DIFFERENTIAL/PLATELET
Basophils Absolute: 0 10*3/uL (ref 0.0–0.1)
Basophils Relative: 0 % (ref 0–1)
Eosinophils Absolute: 0.2 10*3/uL (ref 0.0–0.7)
Eosinophils Relative: 5 % (ref 0–5)
HCT: 23.4 % — ABNORMAL LOW (ref 36.0–46.0)
HEMOGLOBIN: 7.6 g/dL — AB (ref 12.0–15.0)
LYMPHS ABS: 1.5 10*3/uL (ref 0.7–4.0)
Lymphocytes Relative: 34 % (ref 12–46)
MCH: 28.1 pg (ref 26.0–34.0)
MCHC: 32.5 g/dL (ref 30.0–36.0)
MCV: 86.7 fL (ref 78.0–100.0)
MONOS PCT: 8 % (ref 3–12)
Monocytes Absolute: 0.3 10*3/uL (ref 0.1–1.0)
NEUTROS ABS: 2.4 10*3/uL (ref 1.7–7.7)
Neutrophils Relative %: 54 % (ref 43–77)
Platelets: 124 10*3/uL — ABNORMAL LOW (ref 150–400)
RBC: 2.7 MIL/uL — ABNORMAL LOW (ref 3.87–5.11)
RDW: 14.1 % (ref 11.5–15.5)
WBC: 4.4 10*3/uL (ref 4.0–10.5)

## 2013-12-14 LAB — PREGNANCY, URINE: Preg Test, Ur: NEGATIVE

## 2013-12-14 LAB — BASIC METABOLIC PANEL
Anion gap: 19 — ABNORMAL HIGH (ref 5–15)
BUN: 75 mg/dL — AB (ref 6–23)
CHLORIDE: 104 meq/L (ref 96–112)
CO2: 17 meq/L — AB (ref 19–32)
Calcium: 9.3 mg/dL (ref 8.4–10.5)
Creatinine, Ser: 11.58 mg/dL — ABNORMAL HIGH (ref 0.50–1.10)
GFR calc Af Amer: 4 mL/min — ABNORMAL LOW (ref >90)
GFR calc non Af Amer: 4 mL/min — ABNORMAL LOW (ref >90)
GLUCOSE: 77 mg/dL (ref 70–99)
POTASSIUM: 5.4 meq/L — AB (ref 3.7–5.3)
Sodium: 140 mEq/L (ref 137–147)

## 2013-12-14 MED ORDER — FUROSEMIDE 20 MG PO TABS
40.0000 mg | ORAL_TABLET | Freq: Once | ORAL | Status: AC
  Administered 2013-12-14: 40 mg via ORAL
  Filled 2013-12-14: qty 2

## 2013-12-14 NOTE — ED Notes (Signed)
Phlebotomy at bedside.
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
## 2013-12-14 NOTE — ED Notes (Signed)
Patient presents to ED via [facility] from home. Patient c/o feeling "short of breath" after walking to a friends house. Patient states that she missed dialysis today and that is why she feels "short of breath." VSS. A&Ox4. No acute distress noted at this time.

## 2013-12-14 NOTE — Discharge Instructions (Signed)
Shortness of Breath Sara [name], you were seen for shortness of breath because you missed dialysis.  Your potassium was 5.4 and you were given lasix to remove some of it.  Follow up with dialysis on Thursday, this is very important.  For any worsening symptoms, return to the ED immediately.  Thank you. Shortness of breath means you have trouble breathing. Shortness of breath needs medical care right away. HOME CARE   Do not smoke.  Avoid being around chemicals or things (paint fumes, dust) that may bother your breathing.  Rest as needed. Slowly begin your normal activities.  Only take medicines as told by your doctor.  Keep all doctor visits as told. GET HELP RIGHT AWAY IF:   Your shortness of breath gets worse.  You feel lightheaded, pass out (faint), or have a cough that is not helped by medicine.  You cough up blood.  You have pain with breathing.  You have pain in your chest, arms, shoulders, or belly (abdomen).  You have a fever.  You cannot walk up stairs or exercise the way you normally do.  You do not get better in the time expected.  You have a hard time doing normal activities even with rest.  You have problems with your medicines.  You have any new symptoms. MAKE SURE YOU:  Understand these instructions.  Will watch your condition.  Will get help right away if you are not doing well or get worse. Document Released: 09/10/2007 Document Revised: 03/29/2013 Document Reviewed: 06/09/2011 [facility] Patient Information 2015 [place], [place]. This information is not intended to replace advice given to you by your health care provider. Make sure you discuss any questions you have with your health care provider.

## 2014-02-06 ENCOUNTER — Encounter (HOSPITAL_COMMUNITY): Payer: Self-pay | Admitting: Emergency Medicine

## 2014-02-18 ENCOUNTER — Encounter (HOSPITAL_COMMUNITY): Payer: Self-pay | Admitting: Cardiology

## 2014-02-18 ENCOUNTER — Emergency Department (HOSPITAL_COMMUNITY)
Admission: EM | Admit: 2014-02-18 | Discharge: 2014-02-18 | Disposition: A | Discharge status: Home / Self Care | Payer: PRIVATE HEALTH INSURANCE | Attending: Emergency Medicine | Admitting: Emergency Medicine

## 2014-02-18 DIAGNOSIS — Z79899 Other long term (current) drug therapy: Secondary | ICD-10-CM | POA: Insufficient documentation

## 2014-02-18 DIAGNOSIS — I10 Essential (primary) hypertension: Secondary | ICD-10-CM | POA: Diagnosis not present

## 2014-02-18 DIAGNOSIS — R21 Rash and other nonspecific skin eruption: Secondary | ICD-10-CM | POA: Diagnosis present

## 2014-02-18 DIAGNOSIS — Z720 Tobacco use: Secondary | ICD-10-CM | POA: Insufficient documentation

## 2014-02-18 DIAGNOSIS — F29 Unspecified psychosis not due to a substance or known physiological condition: Secondary | ICD-10-CM | POA: Diagnosis not present

## 2014-02-18 DIAGNOSIS — Z452 Encounter for adjustment and management of vascular access device: Secondary | ICD-10-CM | POA: Insufficient documentation

## 2014-02-18 DIAGNOSIS — Z8659 Personal history of other mental and behavioral disorders: Secondary | ICD-10-CM | POA: Diagnosis not present

## 2014-02-18 DIAGNOSIS — Z8739 Personal history of other diseases of the musculoskeletal system and connective tissue: Secondary | ICD-10-CM | POA: Diagnosis not present

## 2014-02-18 LAB — I-STAT CHEM 8, ED
BUN: 55 mg/dL — ABNORMAL HIGH (ref 6–23)
Calcium, Ion: 1.1 mmol/L — ABNORMAL LOW (ref 1.12–1.23)
Chloride: 101 mEq/L (ref 96–112)
Creatinine, Ser: 16 mg/dL — ABNORMAL HIGH (ref 0.50–1.10)
Glucose, Bld: 84 mg/dL (ref 70–99)
HCT: 38 % (ref 36.0–46.0)
Hemoglobin: 12.9 g/dL (ref 12.0–15.0)
Potassium: 4.5 mEq/L (ref 3.7–5.3)
Sodium: 136 mEq/L — ABNORMAL LOW (ref 137–147)
TCO2: 24 mmol/L (ref 0–100)

## 2014-02-18 MED ORDER — TRIAMCINOLONE ACETONIDE 0.1 % EX CREA: 1.0000 "application " | TOPICAL_CREAM | Freq: Two times a day (BID) | CUTANEOUS | Status: DC

## 2014-02-18 NOTE — ED Notes (Signed)
Called pt for triage with no answer x 1

## 2014-02-18 NOTE — ED Notes (Signed)
Attempted to obtain blood work, stuck pt one time and pt demanded to take needle out.  Requesting if she can do it herself I advised her that she could not.  Advised pt that phlebotomy would obtain her lab.
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
## 2014-02-18 NOTE — ED Notes (Signed)
Pt states generalized rash all over body. Ongoing x1 week. Reports severe itching and irritation. States she is due for hemodialysis today, requesting treatment. Denies pain. Fistula to R upper arm intact. No signs of distress noted.

## 2014-02-18 NOTE — Discharge Instructions (Signed)
Return here as needed. Follow up with your doctor. °

## 2014-02-18 NOTE — ED Notes (Signed)
Pt reports she skipped dialysis today to come here because she wanted to have a rash checked out. No SOB, or chest pain.

## 2014-02-20 ENCOUNTER — Encounter (HOSPITAL_COMMUNITY): Payer: Self-pay | Admitting: *Deleted

## 2014-02-20 ENCOUNTER — Observation Stay (HOSPITAL_COMMUNITY)
Admission: EM | Admit: 2014-02-20 | Discharge: 2014-02-21 | Disposition: A | Discharge status: Home / Self Care | Payer: PRIVATE HEALTH INSURANCE | Attending: Emergency Medicine | Admitting: Emergency Medicine

## 2014-02-20 DIAGNOSIS — Z720 Tobacco use: Secondary | ICD-10-CM | POA: Diagnosis not present

## 2014-02-20 DIAGNOSIS — F6089 Other specific personality disorders: Secondary | ICD-10-CM

## 2014-02-20 DIAGNOSIS — D649 Anemia, unspecified: Secondary | ICD-10-CM | POA: Diagnosis present

## 2014-02-20 DIAGNOSIS — Z046 Encounter for general psychiatric examination, requested by authority: Secondary | ICD-10-CM | POA: Diagnosis present

## 2014-02-20 DIAGNOSIS — F919 Conduct disorder, unspecified: Secondary | ICD-10-CM | POA: Diagnosis not present

## 2014-02-20 DIAGNOSIS — Z992 Dependence on renal dialysis: Secondary | ICD-10-CM | POA: Diagnosis not present

## 2014-02-20 DIAGNOSIS — I120 Hypertensive chronic kidney disease with stage 5 chronic kidney disease or end stage renal disease: Secondary | ICD-10-CM | POA: Insufficient documentation

## 2014-02-20 DIAGNOSIS — N186 End stage renal disease: Secondary | ICD-10-CM | POA: Diagnosis not present

## 2014-02-20 DIAGNOSIS — N189 Chronic kidney disease, unspecified: Secondary | ICD-10-CM

## 2014-02-20 DIAGNOSIS — M329 Systemic lupus erythematosus, unspecified: Secondary | ICD-10-CM | POA: Insufficient documentation

## 2014-02-20 DIAGNOSIS — R4689 Other symptoms and signs involving appearance and behavior: Secondary | ICD-10-CM | POA: Diagnosis present

## 2014-02-20 DIAGNOSIS — D631 Anemia in chronic kidney disease: Secondary | ICD-10-CM

## 2014-02-20 DIAGNOSIS — Z79899 Other long term (current) drug therapy: Secondary | ICD-10-CM | POA: Diagnosis not present

## 2014-02-20 DIAGNOSIS — Z8659 Personal history of other mental and behavioral disorders: Secondary | ICD-10-CM | POA: Insufficient documentation

## 2014-02-20 DIAGNOSIS — M3214 Glomerular disease in systemic lupus erythematosus: Secondary | ICD-10-CM | POA: Insufficient documentation

## 2014-02-20 DIAGNOSIS — F209 Schizophrenia, unspecified: Secondary | ICD-10-CM | POA: Diagnosis present

## 2014-02-20 HISTORY — DX: Tobacco use: Z72.0

## 2014-02-20 LAB — COMPREHENSIVE METABOLIC PANEL
ALT: 6 U/L (ref 0–35)
AST: 16 U/L (ref 0–37)
Albumin: 4.4 g/dL (ref 3.5–5.2)
Alkaline Phosphatase: 51 U/L (ref 39–117)
BILIRUBIN TOTAL: 0.5 mg/dL (ref 0.3–1.2)
BUN: 80 mg/dL — AB (ref 6–23)
CALCIUM: 9.9 mg/dL (ref 8.4–10.5)
CO2: 18 meq/L — AB (ref 19–32)
CREATININE: 19.53 mg/dL — AB (ref 0.50–1.10)
Chloride: 95 mEq/L — ABNORMAL LOW (ref 96–112)
GFR, EST AFRICAN AMERICAN: 2 mL/min — AB (ref >90)
GFR, EST NON AFRICAN AMERICAN: 2 mL/min — AB (ref >90)
Glucose, Bld: 70 mg/dL (ref 70–99)
Potassium: 4.9 mEq/L (ref 3.7–5.3)
Sodium: 139 mEq/L (ref 137–147)
Total Protein: 7.9 g/dL (ref 6.0–8.3)

## 2014-02-20 LAB — CBC WITH DIFFERENTIAL/PLATELET
Basophils Absolute: 0 10*3/uL (ref 0.0–0.1)
Basophils Relative: 0 % (ref 0–1)
Eosinophils Absolute: 0 10*3/uL (ref 0.0–0.7)
Eosinophils Relative: 0 % (ref 0–5)
HCT: 29.4 % — ABNORMAL LOW (ref 36.0–46.0)
HEMOGLOBIN: 9.5 g/dL — AB (ref 12.0–15.0)
LYMPHS ABS: 1 10*3/uL (ref 0.7–4.0)
LYMPHS PCT: 28 % (ref 12–46)
MCH: 28.9 pg (ref 26.0–34.0)
MCHC: 32.3 g/dL (ref 30.0–36.0)
MCV: 89.4 fL (ref 78.0–100.0)
MONOS PCT: 10 % (ref 3–12)
Monocytes Absolute: 0.4 10*3/uL (ref 0.1–1.0)
NEUTROS ABS: 2.3 10*3/uL (ref 1.7–7.7)
Neutrophils Relative %: 61 % (ref 43–77)
PLATELETS: 141 10*3/uL — AB (ref 150–400)
RBC: 3.29 MIL/uL — AB (ref 3.87–5.11)
RDW: 16.6 % — ABNORMAL HIGH (ref 11.5–15.5)
WBC: 3.7 10*3/uL — AB (ref 4.0–10.5)

## 2014-02-20 LAB — RAPID URINE DRUG SCREEN, HOSP PERFORMED
AMPHETAMINES: NOT DETECTED
BENZODIAZEPINES: NOT DETECTED
Barbiturates: NOT DETECTED
Cocaine: NOT DETECTED
OPIATES: NOT DETECTED
Tetrahydrocannabinol: NOT DETECTED

## 2014-02-20 LAB — ETHANOL: Alcohol, Ethyl (B): <11 mg/dL (ref 0–11)

## 2014-02-20 LAB — URINALYSIS, ROUTINE W REFLEX MICROSCOPIC
Bilirubin Urine: NEGATIVE
GLUCOSE, UA: NEGATIVE mg/dL
Ketones, ur: NEGATIVE mg/dL
Leukocytes, UA: NEGATIVE
Nitrite: NEGATIVE
SPECIFIC GRAVITY, URINE: 1.015 (ref 1.005–1.030)
Urobilinogen, UA: 0.2 mg/dL (ref 0.0–1.0)
pH: 7 (ref 5.0–8.0)

## 2014-02-20 LAB — URINE MICROSCOPIC-ADD ON

## 2014-02-20 LAB — PREGNANCY, URINE: PREG TEST UR: NEGATIVE

## 2014-02-20 MED ORDER — THIAMINE HCL 100 MG/ML IJ SOLN: 100.0000 mg | Freq: Every day | INTRAMUSCULAR | Status: DC

## 2014-02-20 MED ORDER — NICOTINE 14 MG/24HR TD PT24
14.0000 mg | MEDICATED_PATCH | Freq: Every day | TRANSDERMAL | Status: DC
  Filled 2014-02-20 (×2): qty 1

## 2014-02-20 MED ORDER — HYDROXYZINE HCL 25 MG PO TABS
25.0000 mg | ORAL_TABLET | Freq: Four times a day (QID) | ORAL | Status: DC
  Administered 2014-02-20 – 2014-02-21 (×3): 25 mg via ORAL
  Filled 2014-02-20 (×4): qty 1

## 2014-02-20 MED ORDER — HEPARIN SODIUM (PORCINE) 5000 UNIT/ML IJ SOLN
5000.0000 [IU] | Freq: Three times a day (TID) | INTRAMUSCULAR | Status: DC
  Administered 2014-02-21: 5000 [IU] via SUBCUTANEOUS
  Filled 2014-02-20 (×2): qty 1

## 2014-02-20 MED ORDER — ONDANSETRON HCL 4 MG PO TABS: 4.0000 mg | ORAL_TABLET | Freq: Four times a day (QID) | ORAL | Status: DC | PRN

## 2014-02-20 MED ORDER — LORAZEPAM 1 MG PO TABS: 1.0000 mg | ORAL_TABLET | Freq: Four times a day (QID) | ORAL | Status: DC | PRN

## 2014-02-20 MED ORDER — CALCIUM ACETATE 667 MG PO CAPS
667.0000 mg | ORAL_CAPSULE | Freq: Every day | ORAL | Status: DC | PRN
Start: 2014-02-20 — End: 2014-02-21
  Filled 2014-02-20: qty 1

## 2014-02-20 MED ORDER — ZIPRASIDONE MESYLATE 20 MG IM SOLR
INTRAMUSCULAR | Status: AC
  Filled 2014-02-20: qty 20

## 2014-02-20 MED ORDER — LORAZEPAM 2 MG/ML IJ SOLN
2.0000 mg | Freq: Once | INTRAMUSCULAR | Status: AC
  Administered 2014-02-20: 2 mg via INTRAMUSCULAR
  Filled 2014-02-20: qty 1

## 2014-02-20 MED ORDER — HALOPERIDOL LACTATE 5 MG/ML IJ SOLN
10.0000 mg | Freq: Once | INTRAMUSCULAR | Status: AC
  Administered 2014-02-20: 10 mg via INTRAMUSCULAR
  Filled 2014-02-20: qty 2

## 2014-02-20 MED ORDER — VITAMIN B-1 100 MG PO TABS
100.0000 mg | ORAL_TABLET | Freq: Every day | ORAL | Status: DC
Start: 2014-02-20 — End: 2014-02-21
  Administered 2014-02-20 – 2014-02-21 (×2): 100 mg via ORAL
  Filled 2014-02-20 (×2): qty 1

## 2014-02-20 MED ORDER — ADULT MULTIVITAMIN W/MINERALS CH
1.0000 | ORAL_TABLET | Freq: Every day | ORAL | Status: DC
Start: 2014-02-20 — End: 2014-02-21
  Administered 2014-02-20 – 2014-02-21 (×2): 1 via ORAL
  Filled 2014-02-20 (×2): qty 1

## 2014-02-20 MED ORDER — FOLIC ACID 1 MG PO TABS
1.0000 mg | ORAL_TABLET | Freq: Every day | ORAL | Status: DC
  Administered 2014-02-20 – 2014-02-21 (×2): 1 mg via ORAL
  Filled 2014-02-20 (×2): qty 1

## 2014-02-20 MED ORDER — LORAZEPAM 2 MG/ML IJ SOLN: 1.0000 mg | Freq: Four times a day (QID) | INTRAMUSCULAR | Status: DC | PRN

## 2014-02-20 MED ORDER — CALCIUM ACETATE 667 MG PO CAPS
2001.0000 mg | ORAL_CAPSULE | Freq: Three times a day (TID) | ORAL | Status: DC
  Administered 2014-02-21 (×2): 2001 mg via ORAL
  Filled 2014-02-20 (×7): qty 3

## 2014-02-20 MED ORDER — ONDANSETRON HCL 4 MG/2ML IJ SOLN: 4.0000 mg | Freq: Four times a day (QID) | INTRAMUSCULAR | Status: DC | PRN

## 2014-02-20 MED ORDER — HALOPERIDOL 2 MG PO TABS
5.0000 mg | ORAL_TABLET | Freq: Two times a day (BID) | ORAL | Status: DC
  Administered 2014-02-20 – 2014-02-21 (×2): 5 mg via ORAL
  Filled 2014-02-20 (×2): qty 3

## 2014-02-20 NOTE — ED Notes (Signed)
Patient falling asleep during TTS; patient to be reassessed when she is alert. Will give  a call when she is alert.

## 2014-02-20 NOTE — ED Notes (Signed)
Dr. Barbaraann Faster at bedside.
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
## 2014-02-20 NOTE — ED Notes (Signed)
TTS monitor in room for evaluation.

## 2014-02-20 NOTE — BH Assessment (Signed)
Attempted Tele Assessment at 9:35 AM as scheduled with [facility], RN; patient very drowsy and unable to stay awake; likely due to Haldol administration earlier today. Call made to RN who agreed to call [facility] when patient is alert and able to participate in assessment.  Sheilah Pigeon, LCSW
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
## 2014-02-20 NOTE — BH Assessment (Signed)
Per patient's RN, Sara [name] who reports patient remains sedated, unable to participate in [place], LCSW

## 2014-02-20 NOTE — ED Notes (Signed)
[employer] to leave, patient is quiet and resting right now.
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
## 2014-02-20 NOTE — ED Notes (Signed)
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

## 2014-02-20 NOTE — ED Notes (Signed)
Dr. Barbaraann Faster states that pt. does not need IV access at this time. Dr. Barbaraann Faster aware of pt. BP 161/117.

## 2014-02-20 NOTE — ED Notes (Signed)
Patient awake, escorted to restroom, patient now eating dinner, cooperative at this time.

## 2014-02-20 NOTE — ED Notes (Signed)
Patient clothes and purse locked up.

## 2014-02-20 NOTE — ED Provider Notes (Signed)
  Physical Exam  BP 168/104 mmHg  Pulse 90  Temp(Src) 97.8 F (36.6 C) (Oral)  Resp 18  SpO2 98%  Physical Exam  ED Course  Procedures  MDM Patient was pending transfer to [facility] so she could have dialysis by her known dialysis team. Unable to get patient transferred because [facility] not transfer patient since the patient is being transferred for medical reasons and his IVC and is not transferred to a psychiatric hospital. They reportedly talked to their lawyers. I discussed with Dr.Befekadu, will dialyze the patient is an inpatient tomorrow. Discussed with Dr. Marily Memos, who will admit the patient. I discussed with the family practice residents at [facility] and informed them that the patient will not be transferred.      Jasper Riling. Alvino Chapel, MD 02/20/14 [phone]
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
## 2014-02-20 NOTE — BH Assessment (Signed)
Per, Lidia Collum, RN patient is not able to be assessed due to the medication given to her earlier today.

## 2014-02-20 NOTE — ED Notes (Signed)
Patient may be not be transferred to [facility] due to issues with IVC paperwork needing to be rescinded. EDP to call Dr. Lowanda Foster to dialysis patient here at [facility].

## 2014-02-20 NOTE — ED Notes (Signed)
Unable to get temperature. Pt was babbling while I was trying to take temperature. Then began to try and eat the temp probe.

## 2014-02-20 NOTE — BH Assessment (Signed)
Per patient's RN, Sara [name] who reports patient remains sedated, unable to participate in Tele Assessment

## 2014-02-20 NOTE — ED Notes (Signed)
Patient to be transferred to [facility] for dialysis. [employer] to come by and discuss how patient should travel as patient is IVC'd.

## 2014-02-21 ENCOUNTER — Encounter (HOSPITAL_COMMUNITY): Payer: Self-pay

## 2014-02-21 ENCOUNTER — Emergency Department (HOSPITAL_COMMUNITY)
Admission: EM | Admit: 2014-02-21 | Discharge: 2014-02-22 | Disposition: A | Discharge status: Home / Self Care | Payer: PRIVATE HEALTH INSURANCE | Attending: Emergency Medicine | Admitting: Emergency Medicine

## 2014-02-21 ENCOUNTER — Encounter (HOSPITAL_COMMUNITY): Payer: Self-pay | Admitting: *Deleted

## 2014-02-21 ENCOUNTER — Emergency Department (HOSPITAL_COMMUNITY): Payer: PRIVATE HEALTH INSURANCE

## 2014-02-21 DIAGNOSIS — Z992 Dependence on renal dialysis: Secondary | ICD-10-CM | POA: Insufficient documentation

## 2014-02-21 DIAGNOSIS — I120 Hypertensive chronic kidney disease with stage 5 chronic kidney disease or end stage renal disease: Secondary | ICD-10-CM | POA: Diagnosis not present

## 2014-02-21 DIAGNOSIS — Z8739 Personal history of other diseases of the musculoskeletal system and connective tissue: Secondary | ICD-10-CM | POA: Insufficient documentation

## 2014-02-21 DIAGNOSIS — Z720 Tobacco use: Secondary | ICD-10-CM | POA: Insufficient documentation

## 2014-02-21 DIAGNOSIS — N186 End stage renal disease: Secondary | ICD-10-CM | POA: Insufficient documentation

## 2014-02-21 DIAGNOSIS — R0602 Shortness of breath: Secondary | ICD-10-CM | POA: Diagnosis present

## 2014-02-21 DIAGNOSIS — F203 Undifferentiated schizophrenia: Secondary | ICD-10-CM

## 2014-02-21 DIAGNOSIS — N189 Chronic kidney disease, unspecified: Secondary | ICD-10-CM

## 2014-02-21 DIAGNOSIS — F450 Somatization disorder: Secondary | ICD-10-CM | POA: Insufficient documentation

## 2014-02-21 DIAGNOSIS — Z8659 Personal history of other mental and behavioral disorders: Secondary | ICD-10-CM | POA: Insufficient documentation

## 2014-02-21 DIAGNOSIS — Z79899 Other long term (current) drug therapy: Secondary | ICD-10-CM | POA: Diagnosis not present

## 2014-02-21 DIAGNOSIS — Z7952 Long term (current) use of systemic steroids: Secondary | ICD-10-CM | POA: Diagnosis not present

## 2014-02-21 DIAGNOSIS — F4325 Adjustment disorder with mixed disturbance of emotions and conduct: Secondary | ICD-10-CM

## 2014-02-21 DIAGNOSIS — F319 Bipolar disorder, unspecified: Secondary | ICD-10-CM

## 2014-02-21 LAB — URINE CULTURE
CULTURE: NO GROWTH
Colony Count: NO GROWTH

## 2014-02-21 LAB — CBC
HCT: 28.4 % — ABNORMAL LOW (ref 36.0–46.0)
Hemoglobin: 9.2 g/dL — ABNORMAL LOW (ref 12.0–15.0)
MCH: 28.8 pg (ref 26.0–34.0)
MCHC: 32.4 g/dL (ref 30.0–36.0)
MCV: 88.8 fL (ref 78.0–100.0)
PLATELETS: 210 10*3/uL (ref 150–400)
RBC: 3.2 MIL/uL — AB (ref 3.87–5.11)
RDW: 16.5 % — ABNORMAL HIGH (ref 11.5–15.5)
WBC: 3.2 10*3/uL — AB (ref 4.0–10.5)

## 2014-02-21 LAB — CBC WITH DIFFERENTIAL/PLATELET
BASOS ABS: 0 10*3/uL (ref 0.0–0.1)
BASOS PCT: 0 % (ref 0–1)
Eosinophils Absolute: 0.1 10*3/uL (ref 0.0–0.7)
Eosinophils Relative: 2 % (ref 0–5)
HCT: 30.8 % — ABNORMAL LOW (ref 36.0–46.0)
HEMOGLOBIN: 10 g/dL — AB (ref 12.0–15.0)
Lymphocytes Relative: 32 % (ref 12–46)
Lymphs Abs: 1.1 10*3/uL (ref 0.7–4.0)
MCH: 29 pg (ref 26.0–34.0)
MCHC: 32.5 g/dL (ref 30.0–36.0)
MCV: 89.3 fL (ref 78.0–100.0)
MONOS PCT: 9 % (ref 3–12)
Monocytes Absolute: 0.3 10*3/uL (ref 0.1–1.0)
NEUTROS PCT: 57 % (ref 43–77)
Neutro Abs: 1.9 10*3/uL (ref 1.7–7.7)
PLATELETS: 182 10*3/uL (ref 150–400)
RBC: 3.45 MIL/uL — ABNORMAL LOW (ref 3.87–5.11)
RDW: 16.6 % — AB (ref 11.5–15.5)
WBC: 3.4 10*3/uL — ABNORMAL LOW (ref 4.0–10.5)

## 2014-02-21 LAB — COMPREHENSIVE METABOLIC PANEL
ALT: 6 U/L (ref 0–35)
AST: 15 U/L (ref 0–37)
Albumin: 3.7 g/dL (ref 3.5–5.2)
Alkaline Phosphatase: 49 U/L (ref 39–117)
BILIRUBIN TOTAL: 0.4 mg/dL (ref 0.3–1.2)
BUN: 93 mg/dL — ABNORMAL HIGH (ref 6–23)
CALCIUM: 9.6 mg/dL (ref 8.4–10.5)
CHLORIDE: 98 meq/L (ref 96–112)
CO2: 19 meq/L (ref 19–32)
CREATININE: 21.52 mg/dL — AB (ref 0.50–1.10)
GFR calc Af Amer: 2 mL/min — ABNORMAL LOW (ref >90)
GFR, EST NON AFRICAN AMERICAN: 2 mL/min — AB (ref >90)
Glucose, Bld: 65 mg/dL — ABNORMAL LOW (ref 70–99)
Potassium: 5.1 mEq/L (ref 3.7–5.3)
Sodium: 141 mEq/L (ref 137–147)
Total Protein: 7.3 g/dL (ref 6.0–8.3)

## 2014-02-21 LAB — PROTIME-INR
INR: 1.21 (ref 0.00–1.49)
PROTHROMBIN TIME: 15.4 s — AB (ref 11.6–15.2)

## 2014-02-21 LAB — BASIC METABOLIC PANEL
ANION GAP: 15 (ref 5–15)
BUN: 48 mg/dL — AB (ref 6–23)
CALCIUM: 10 mg/dL (ref 8.4–10.5)
CO2: 30 mEq/L (ref 19–32)
CREATININE: 13.3 mg/dL — AB (ref 0.50–1.10)
Chloride: 96 mEq/L (ref 96–112)
GFR, EST AFRICAN AMERICAN: 4 mL/min — AB (ref >90)
GFR, EST NON AFRICAN AMERICAN: 3 mL/min — AB (ref >90)
Glucose, Bld: 88 mg/dL (ref 70–99)
Potassium: 3.9 mEq/L (ref 3.7–5.3)
Sodium: 141 mEq/L (ref 137–147)

## 2014-02-21 LAB — APTT: APTT: 33 s (ref 24–37)

## 2014-02-21 MED ORDER — HEPARIN SODIUM (PORCINE) 1000 UNIT/ML DIALYSIS
20.0000 [IU]/kg | INTRAMUSCULAR | Status: DC | PRN
  Administered 2014-02-21: 1300 [IU] via INTRAVENOUS_CENTRAL
  Filled 2014-02-21 (×2): qty 2

## 2014-02-21 MED ORDER — SODIUM CHLORIDE 0.9 % IV SOLN: 100.0000 mL | INTRAVENOUS | Status: DC | PRN

## 2014-02-21 MED ORDER — EPOETIN ALFA 4000 UNIT/ML IJ SOLN
INTRAMUSCULAR | Status: AC
  Administered 2014-02-21: 4000 [IU] via INTRAVENOUS
  Filled 2014-02-21: qty 1

## 2014-02-21 MED ORDER — LIDOCAINE HCL (PF) 1 % IJ SOLN
5.0000 mL | INTRAMUSCULAR | Status: DC | PRN
  Administered 2014-02-21: 5 mL via INTRADERMAL

## 2014-02-21 MED ORDER — ACETAMINOPHEN 325 MG PO TABS
650.0000 mg | ORAL_TABLET | Freq: Once | ORAL | Status: DC
  Filled 2014-02-21: qty 2

## 2014-02-21 MED ORDER — LIDOCAINE HCL (PF) 1 % IJ SOLN
INTRAMUSCULAR | Status: AC
  Administered 2014-02-21: 5 mL via INTRADERMAL
  Filled 2014-02-21: qty 5

## 2014-02-21 MED ORDER — EPOETIN ALFA 4000 UNIT/ML IJ SOLN
4000.0000 [IU] | INTRAMUSCULAR | Status: DC
  Administered 2014-02-21: 4000 [IU] via INTRAVENOUS
  Filled 2014-02-21: qty 1

## 2014-02-21 MED ORDER — ALTEPLASE 2 MG IJ SOLR
2.0000 mg | Freq: Once | INTRAMUSCULAR | Status: DC | PRN
Start: 2014-02-21 — End: 2014-02-21
  Filled 2014-02-21: qty 2

## 2014-02-21 MED ORDER — LIDOCAINE-PRILOCAINE 2.5-2.5 % EX CREA: 1.0000 "application " | TOPICAL_CREAM | CUTANEOUS | Status: DC | PRN

## 2014-02-21 MED ORDER — HEPARIN SODIUM (PORCINE) 1000 UNIT/ML DIALYSIS
1000.0000 [IU] | INTRAMUSCULAR | Status: DC | PRN
  Filled 2014-02-21: qty 1

## 2014-02-21 MED ORDER — PENTAFLUOROPROP-TETRAFLUOROETH EX AERO
1.0000 "application " | INHALATION_SPRAY | CUTANEOUS | Status: DC | PRN
  Filled 2014-02-21: qty 30

## 2014-02-21 MED ORDER — NEPRO/CARBSTEADY PO LIQD: 237.0000 mL | ORAL | Status: DC | PRN

## 2014-02-21 MED ORDER — HEPARIN SODIUM (PORCINE) 1000 UNIT/ML IJ SOLN
INTRAMUSCULAR | Status: AC
  Administered 2014-02-21: 1300 [IU] via INTRAVENOUS_CENTRAL
  Filled 2014-02-21: qty 2
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
## 2014-02-21 NOTE — BH Assessment (Signed)
Received telephone call from pt's nurse Loma Sousa who reports that patient is ready to be assessed. Informed Heloise Purpura who reports that he will assess patient today prior to 1700.   Shaune Pollack, MS, Secretary Assessment Counselor

## 2014-02-21 NOTE — Progress Notes (Signed)
Hemodialysis- Pt signed off AMA after 2.5 hours of treatment stating she was cramping "off an on". AMA form placed in chart. Vitals stable. Report called to [place]. Pt still reporting slurred speech intermittently.

## 2014-02-21 NOTE — ED Notes (Signed)
MD at bedside. 

## 2014-02-21 NOTE — Care Management Utilization Note (Signed)
UR completed 
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
## 2014-02-21 NOTE — Progress Notes (Signed)
Hemodialysis- Pt cooperative and calm. Requesting medication for itching and a headache. Cannulated with 16g needles as pt would not allow 15g. Pt states she will not run 3.5 hour treatment as that is "too long." Explained the risk associated with skipping HD and cutting treatments early. Pt states understanding. Discussed the importance of keeping the access uncovered and visible during treatment.

## 2014-02-21 NOTE — ED Notes (Signed)
Having shortness of breath that comes and goes. Patient states that when she went to bed and it felt like she was swallowing her tongue. Having trouble talking at times. Patient had dialysis today.
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
## 2014-02-21 NOTE — Clinical Social Work Note (Signed)
CSW attempted to meet with pt, but pt currently in dialysis. CSW will follow up tomorrow.  Benay Pike, [place]

## 2014-02-21 NOTE — BH Assessment (Signed)
TTS attempted to reach pt's nurse and unable to reach her at this time to inform her that patient needs to follow-up at [facility] for crisis mental health services, OPT, and Psychiatry.  Shaune Pollack, MS, [place] Assessment Counselor

## 2014-02-21 NOTE — ED Notes (Signed)
Pt reporting SOB, pt able to speak clearly but has tachypnea. Pt told to breathe through nose with slow deep breaths. Pt returned to normal with eased respiration. O2 sats at 100%.

## 2014-02-21 NOTE — BH Assessment (Signed)
Disposition of patient to be determined by Telepsych(TP) consult to be completed by psychiatric extender.   Shaune Pollack, MS, [place] Assessment Counselor
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
## 2014-02-21 NOTE — BH Assessment (Signed)
Spoke with pt's nurse Loma Sousa at [facility] to schedule TP. Per Loma Sousa patient is currently in Dialysis Treatment and will not be available to be assessed until around 5pm. Pt's nurse reports that she will call TTS when patient is available for assessment.   Shaune Pollack, MS, [place] Assessment Counselor

## 2014-02-21 NOTE — BH Assessment (Signed)
This writer attempted to reach [facility] via telephone @[phone] to secure outpatient appointment for patient. Writer reached [employer]. Pt will need to present to [facility] between the hours of 8am-5pm for walk-in services. Pt can contact [facility] mobile crisis line at [phone] in the event of mental health crisis or return to ED.  Shaune Pollack, MS, [place] Assessment Counselor

## 2014-02-21 NOTE — BH Assessment (Signed)
TTS assessment in progress.   Shaune Pollack, MS, [place] Assessment Counselor
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
## 2014-02-21 NOTE — BH Assessment (Signed)
Spoke with pt's nurse Loma Sousa at [facility] hospital(medical floor) who is in agreement to set up tele-psych cart for assessment to begin shortly.  Shaune Pollack, MS, [place] Assessment Counselor

## 2014-02-22 LAB — HEPATITIS B SURFACE ANTIGEN: Hepatitis B Surface Ag: NEGATIVE

## 2014-02-24 ENCOUNTER — Emergency Department (HOSPITAL_COMMUNITY)
Admission: EM | Admit: 2014-02-24 | Discharge: 2014-02-24 | Discharge status: Against Medical Advice | Payer: PRIVATE HEALTH INSURANCE | Attending: Emergency Medicine | Admitting: Emergency Medicine

## 2014-02-24 ENCOUNTER — Encounter (HOSPITAL_COMMUNITY): Payer: Self-pay | Admitting: Emergency Medicine

## 2014-02-24 DIAGNOSIS — I10 Essential (primary) hypertension: Secondary | ICD-10-CM | POA: Insufficient documentation

## 2014-02-24 DIAGNOSIS — F419 Anxiety disorder, unspecified: Secondary | ICD-10-CM | POA: Diagnosis present

## 2014-02-24 DIAGNOSIS — Z720 Tobacco use: Secondary | ICD-10-CM | POA: Insufficient documentation

## 2014-02-24 NOTE — ED Notes (Signed)
Pt did not respond for room

## 2014-02-24 NOTE — ED Notes (Signed)
Pt walked out of room to waiting room, pt would not stop for staff, unsure if pt is going to return. Pt denied SI/HI during assessment.

## 2014-02-24 NOTE — ED Notes (Addendum)
Upon arrival to er pt wanting to go smoke before triage can be completed. Pt reports she called ems d/t "nervous twitches" to L side of body. Pt sts she took cold medication at 10pm tonight. Also reports she only had hour and half of dialysis tx today d/t being cold.

## 2014-02-24 NOTE — ED Notes (Addendum)
Per ems-- pt reports difficulty speaking. Upon arrival to ER pt speech is clear. Pt has been seen for same over past 4 days and informed this was caused by anxiety. Pt is tues, thurs, Saturday dialysis, access to R arm.

## 2014-02-24 NOTE — ED Notes (Signed)
Pt returned. sts she was outside smoking. Pt informed not to leave.

## 2014-03-07 ENCOUNTER — Encounter (HOSPITAL_COMMUNITY): Payer: Self-pay | Admitting: Emergency Medicine

## 2014-03-07 ENCOUNTER — Emergency Department (HOSPITAL_COMMUNITY)
Admission: EM | Admit: 2014-03-07 | Discharge: 2014-03-07 | Disposition: A | Discharge status: Home / Self Care | Payer: PRIVATE HEALTH INSURANCE | Attending: Emergency Medicine | Admitting: Emergency Medicine

## 2014-03-07 DIAGNOSIS — I10 Essential (primary) hypertension: Secondary | ICD-10-CM | POA: Diagnosis not present

## 2014-03-07 DIAGNOSIS — N760 Acute vaginitis: Secondary | ICD-10-CM | POA: Diagnosis not present

## 2014-03-07 DIAGNOSIS — Z79899 Other long term (current) drug therapy: Secondary | ICD-10-CM | POA: Diagnosis not present

## 2014-03-07 DIAGNOSIS — L293 Anogenital pruritus, unspecified: Secondary | ICD-10-CM | POA: Diagnosis present

## 2014-03-07 DIAGNOSIS — Z8739 Personal history of other diseases of the musculoskeletal system and connective tissue: Secondary | ICD-10-CM | POA: Diagnosis not present

## 2014-03-07 DIAGNOSIS — F29 Unspecified psychosis not due to a substance or known physiological condition: Secondary | ICD-10-CM | POA: Diagnosis not present

## 2014-03-07 DIAGNOSIS — R102 Pelvic and perineal pain: Secondary | ICD-10-CM | POA: Insufficient documentation

## 2014-03-07 DIAGNOSIS — Z720 Tobacco use: Secondary | ICD-10-CM | POA: Diagnosis not present

## 2014-03-07 DIAGNOSIS — B9689 Other specified bacterial agents as the cause of diseases classified elsewhere: Secondary | ICD-10-CM

## 2014-03-07 DIAGNOSIS — R21 Rash and other nonspecific skin eruption: Secondary | ICD-10-CM | POA: Insufficient documentation

## 2014-03-07 LAB — WET PREP, GENITAL
TRICH WET PREP: NONE SEEN
WBC, Wet Prep HPF POC: NONE SEEN
Yeast Wet Prep HPF POC: NONE SEEN

## 2014-03-07 LAB — HIV ANTIBODY (ROUTINE TESTING W REFLEX): HIV 1&2 Ab, 4th Generation: NONREACTIVE

## 2014-03-07 LAB — RPR

## 2014-03-07 MED ORDER — DEXAMETHASONE 4 MG PO TABS
12.0000 mg | ORAL_TABLET | Freq: Once | ORAL | Status: AC
  Administered 2014-03-07: 12 mg via ORAL
  Filled 2014-03-07: qty 3

## 2014-03-07 MED ORDER — METRONIDAZOLE 500 MG PO TABS: 500.0000 mg | ORAL_TABLET | Freq: Two times a day (BID) | ORAL | Status: DC
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
## 2014-03-07 NOTE — Discharge Instructions (Signed)
I am not sure what is causing your rash. You have been given a dose of a steroid to see if it helps - it should last about three days. Consider asking your PCP to refer you to a dermatologist.  Bacterial Vaginosis Bacterial vaginosis is a vaginal infection that occurs when the normal balance of bacteria in the vagina is disrupted. It results from an overgrowth of certain bacteria. This is the most common vaginal infection in women of childbearing age. Treatment is important to prevent complications, especially in pregnant women, as it can cause a premature delivery. CAUSES  Bacterial vaginosis is caused by an increase in harmful bacteria that are normally present in smaller amounts in the vagina. Several different kinds of bacteria can cause bacterial vaginosis. However, the reason that the condition develops is not fully understood. RISK FACTORS Certain activities or behaviors can put you at an increased risk of developing bacterial vaginosis, including:  Having a new sex partner or multiple sex partners.  Douching.  Using an intrauterine device (IUD) for contraception. Women do not get bacterial vaginosis from toilet seats, bedding, swimming pools, or contact with objects around them. SIGNS AND SYMPTOMS  Some women with bacterial vaginosis have no signs or symptoms. Common symptoms include:  Grey vaginal discharge.  A fishlike odor with discharge, especially after sexual intercourse.  Itching or burning of the vagina and vulva.  Burning or pain with urination. DIAGNOSIS  Your health care provider will take a medical history and examine the vagina for signs of bacterial vaginosis. A sample of vaginal fluid may be taken. Your health care provider will look at this sample under a microscope to check for bacteria and abnormal cells. A vaginal pH test may also be done.  TREATMENT  Bacterial vaginosis may be treated with antibiotic medicines. These may be given in the form of a pill or a  vaginal cream. A second round of antibiotics may be prescribed if the condition comes back after treatment.  HOME CARE INSTRUCTIONS   Only take over-the-counter or prescription medicines as directed by your health care provider.  If antibiotic medicine was prescribed, take it as directed. Make sure you finish it even if you start to feel better.  Do not have sex until treatment is completed.  Tell all sexual partners that you have a vaginal infection. They should see their health care provider and be treated if they have problems, such as a mild rash or itching.  Practice safe sex by using condoms and only having one sex partner. SEEK MEDICAL CARE IF:   Your symptoms are not improving after 3 days of treatment.  You have increased discharge or pain.  You have a fever. MAKE SURE YOU:   Understand these instructions.  Will watch your condition.  Will get help right away if you are not doing well or get worse. FOR MORE INFORMATION  Centers for Disease Control and Prevention, Division of STD Prevention: AppraiserFraud.fi American Sexual Health Association (ASHA): www.ashastd.org  Document Released: 03/24/2005 Document Revised: 01/12/2013 Document Reviewed: 11/03/2012 [facility] Patient Information 2015 [place], [place]. This information is not intended to replace advice given to you by your health care provider. Make sure you discuss any questions you have with your health care provider.  Metronidazole tablets or capsules What is this medicine? METRONIDAZOLE (me troe NI da zole) is an antiinfective. It is used to treat certain kinds of bacterial and protozoal infections. It will not work for colds, flu, or other viral infections. This medicine may  be used for other purposes; ask your health care provider or pharmacist if you have questions. COMMON BRAND NAME(S): Flagyl What should I tell my health care provider before I take this medicine? They need to know if you have any of these  conditions: -anemia or other blood disorders -disease of the nervous system -fungal or yeast infection -if you drink alcohol containing drinks -liver disease -seizures -an unusual or allergic reaction to metronidazole, or other medicines, foods, dyes, or preservatives -pregnant or trying to get pregnant -breast-feeding How should I use this medicine? Take this medicine by mouth with a full glass of water. Follow the directions on the prescription label. Take your medicine at regular intervals. Do not take your medicine more often than directed. Take all of your medicine as directed even if you think you are better. Do not skip doses or stop your medicine early. Talk to your pediatrician regarding the use of this medicine in children. Special care may be needed. Overdosage: If you think you have taken too much of this medicine contact a poison control center or emergency room at once. NOTE: This medicine is only for you. Do not share this medicine with others. What if I miss a dose? If you miss a dose, take it as soon as you can. If it is almost time for your next dose, take only that dose. Do not take double or extra doses. What may interact with this medicine? Do not take this medicine with any of the following medications: -alcohol or any product that contains alcohol -amprenavir oral solution -cisapride -disulfiram -dofetilide -dronedarone -paclitaxel injection -pimozide -ritonavir oral solution -sertraline oral solution -sulfamethoxazole-trimethoprim injection -thioridazine -ziprasidone This medicine may also interact with the following medications: -birth control pills -cimetidine -lithium -other medicines that prolong the QT interval (cause an abnormal heart rhythm) -phenobarbital -phenytoin -warfarin This list may not describe all possible interactions. Give your health care provider a list of all the medicines, herbs, non-prescription drugs, or dietary supplements you  use. Also tell them if you smoke, drink alcohol, or use illegal drugs. Some items may interact with your medicine. What should I watch for while using this medicine? Tell your doctor or health care professional if your symptoms do not improve or if they get worse. You may get drowsy or dizzy. Do not drive, use machinery, or do anything that needs mental alertness until you know how this medicine affects you. Do not stand or sit up quickly, especially if you are an older patient. This reduces the risk of dizzy or fainting spells. Avoid alcoholic drinks while you are taking this medicine and for three days afterward. Alcohol may make you feel dizzy, sick, or flushed. If you are being treated for a sexually transmitted disease, avoid sexual contact until you have finished your treatment. Your sexual partner may also need treatment. What side effects may I notice from receiving this medicine? Side effects that you should report to your doctor or health care professional as soon as possible: -allergic reactions like skin rash or hives, swelling of the face, lips, or tongue -confusion, clumsiness -difficulty speaking -discolored or sore mouth -dizziness -fever, infection -numbness, tingling, pain or weakness in the hands or feet -trouble passing urine or change in the amount of urine -redness, blistering, peeling or loosening of the skin, including inside the mouth -seizures -unusually weak or tired -vaginal irritation, dryness, or discharge Side effects that usually do not require medical attention (report to your doctor or health care professional if they  continue or are bothersome): -diarrhea -headache -irritability -metallic taste -nausea -stomach pain or cramps -trouble sleeping This list may not describe all possible side effects. Call your doctor for medical advice about side effects. You may report side effects to FDA at 1-800-FDA-1088. Where should I keep my medicine? Keep out of the  reach of children. Store at room temperature below 25 degrees C (77 degrees F). Protect from light. Keep container tightly closed. Throw away any unused medicine after the expiration date. NOTE: This sheet is a summary. It may not cover all possible information. If you have questions about this medicine, talk to your doctor, pharmacist, or health care provider.  2015, Elsevier/Gold Standard. (2012-10-29 14:08:39)

## 2014-03-07 NOTE — ED Notes (Signed)
Pelvic cart present.

## 2014-03-07 NOTE — ED Notes (Signed)
Pt. reports vaginal itching , nausea and multiple insect bites at legs and arms for several days .

## 2014-03-08 ENCOUNTER — Encounter: Payer: Self-pay | Admitting: Family Medicine

## 2014-03-08 LAB — GC/CHLAMYDIA PROBE AMP
CT PROBE, AMP APTIMA: NEGATIVE
GC Probe RNA: NEGATIVE

## 2014-03-09 ENCOUNTER — Encounter: Payer: Self-pay | Admitting: Family Medicine

## 2014-03-10 ENCOUNTER — Encounter: Payer: Self-pay | Admitting: Family Medicine

## 2014-03-13 ENCOUNTER — Emergency Department (HOSPITAL_COMMUNITY)
Admission: EM | Admit: 2014-03-13 | Discharge: 2014-03-13 | Disposition: A | Discharge status: Home / Self Care | Payer: PRIVATE HEALTH INSURANCE | Attending: Internal Medicine | Admitting: Internal Medicine

## 2014-03-13 ENCOUNTER — Emergency Department (HOSPITAL_COMMUNITY): Payer: PRIVATE HEALTH INSURANCE

## 2014-03-13 ENCOUNTER — Other Ambulatory Visit: Payer: Self-pay | Admitting: Family Medicine

## 2014-03-13 ENCOUNTER — Encounter (HOSPITAL_COMMUNITY): Payer: Self-pay | Admitting: Emergency Medicine

## 2014-03-13 ENCOUNTER — Other Ambulatory Visit: Payer: Self-pay

## 2014-03-13 DIAGNOSIS — M3214 Glomerular disease in systemic lupus erythematosus: Secondary | ICD-10-CM | POA: Insufficient documentation

## 2014-03-13 DIAGNOSIS — Z79899 Other long term (current) drug therapy: Secondary | ICD-10-CM | POA: Diagnosis not present

## 2014-03-13 DIAGNOSIS — F319 Bipolar disorder, unspecified: Secondary | ICD-10-CM | POA: Diagnosis not present

## 2014-03-13 DIAGNOSIS — R0600 Dyspnea, unspecified: Secondary | ICD-10-CM

## 2014-03-13 DIAGNOSIS — F17210 Nicotine dependence, cigarettes, uncomplicated: Secondary | ICD-10-CM | POA: Insufficient documentation

## 2014-03-13 DIAGNOSIS — R000 Tachycardia, unspecified: Secondary | ICD-10-CM | POA: Insufficient documentation

## 2014-03-13 DIAGNOSIS — E875 Hyperkalemia: Secondary | ICD-10-CM | POA: Diagnosis not present

## 2014-03-13 DIAGNOSIS — F209 Schizophrenia, unspecified: Secondary | ICD-10-CM | POA: Diagnosis not present

## 2014-03-13 DIAGNOSIS — Z888 Allergy status to other drugs, medicaments and biological substances status: Secondary | ICD-10-CM | POA: Insufficient documentation

## 2014-03-13 DIAGNOSIS — I120 Hypertensive chronic kidney disease with stage 5 chronic kidney disease or end stage renal disease: Secondary | ICD-10-CM | POA: Insufficient documentation

## 2014-03-13 DIAGNOSIS — Z91018 Allergy to other foods: Secondary | ICD-10-CM | POA: Insufficient documentation

## 2014-03-13 DIAGNOSIS — E8770 Fluid overload, unspecified: Secondary | ICD-10-CM | POA: Diagnosis present

## 2014-03-13 DIAGNOSIS — Z880 Allergy status to penicillin: Secondary | ICD-10-CM | POA: Diagnosis not present

## 2014-03-13 DIAGNOSIS — M329 Systemic lupus erythematosus, unspecified: Secondary | ICD-10-CM | POA: Diagnosis not present

## 2014-03-13 DIAGNOSIS — R0602 Shortness of breath: Secondary | ICD-10-CM | POA: Insufficient documentation

## 2014-03-13 LAB — URINALYSIS, ROUTINE W REFLEX MICROSCOPIC
Bilirubin Urine: NEGATIVE
Glucose, UA: 100 mg/dL — AB
Ketones, ur: NEGATIVE mg/dL
Leukocytes, UA: NEGATIVE
NITRITE: NEGATIVE
Protein, ur: >300 mg/dL — AB
SPECIFIC GRAVITY, URINE: 1.012 (ref 1.005–1.030)
UROBILINOGEN UA: 0.2 mg/dL (ref 0.0–1.0)
pH: 7.5 (ref 5.0–8.0)

## 2014-03-13 LAB — CBC WITH DIFFERENTIAL/PLATELET
Basophils Absolute: 0 10*3/uL (ref 0.0–0.1)
Basophils Relative: 1 % (ref 0–1)
Eosinophils Absolute: 0.3 10*3/uL (ref 0.0–0.7)
Eosinophils Relative: 6 % — ABNORMAL HIGH (ref 0–5)
HCT: 26 % — ABNORMAL LOW (ref 36.0–46.0)
Hemoglobin: 8.3 g/dL — ABNORMAL LOW (ref 12.0–15.0)
LYMPHS ABS: 1.3 10*3/uL (ref 0.7–4.0)
LYMPHS PCT: 28 % (ref 12–46)
MCH: 27.3 pg (ref 26.0–34.0)
MCHC: 31.9 g/dL (ref 30.0–36.0)
MCV: 85.5 fL (ref 78.0–100.0)
Monocytes Absolute: 0.3 10*3/uL (ref 0.1–1.0)
Monocytes Relative: 7 % (ref 3–12)
NEUTROS PCT: 58 % (ref 43–77)
Neutro Abs: 2.6 10*3/uL (ref 1.7–7.7)
PLATELETS: 195 10*3/uL (ref 150–400)
RBC: 3.04 MIL/uL — AB (ref 3.87–5.11)
RDW: 15.5 % (ref 11.5–15.5)
WBC: 4.5 10*3/uL (ref 4.0–10.5)

## 2014-03-13 LAB — COMPREHENSIVE METABOLIC PANEL
ALT: 27 U/L (ref 0–35)
ANION GAP: 23 — AB (ref 5–15)
AST: 26 U/L (ref 0–37)
Albumin: 3.1 g/dL — ABNORMAL LOW (ref 3.5–5.2)
Alkaline Phosphatase: 51 U/L (ref 39–117)
BUN: 144 mg/dL — ABNORMAL HIGH (ref 6–23)
CALCIUM: 9.8 mg/dL (ref 8.4–10.5)
CO2: 19 mEq/L (ref 19–32)
Chloride: 98 mEq/L (ref 96–112)
Creatinine, Ser: 19.91 mg/dL — ABNORMAL HIGH (ref 0.50–1.10)
GFR calc Af Amer: 2 mL/min — ABNORMAL LOW (ref >90)
GFR calc non Af Amer: 2 mL/min — ABNORMAL LOW (ref >90)
Glucose, Bld: 93 mg/dL (ref 70–99)
Potassium: 6.3 mEq/L — ABNORMAL HIGH (ref 3.7–5.3)
SODIUM: 140 meq/L (ref 137–147)
TOTAL PROTEIN: 6.2 g/dL (ref 6.0–8.3)
Total Bilirubin: 0.2 mg/dL — ABNORMAL LOW (ref 0.3–1.2)

## 2014-03-13 LAB — URINE MICROSCOPIC-ADD ON

## 2014-03-13 MED ORDER — CLINDAMYCIN HCL 300 MG PO CAPS: 300.0000 mg | ORAL_CAPSULE | Freq: Four times a day (QID) | ORAL | Status: DC

## 2014-03-13 MED ORDER — ONDANSETRON 4 MG PO TBDP
4.0000 mg | ORAL_TABLET | Freq: Once | ORAL | Status: DC
  Filled 2014-03-13: qty 1

## 2014-03-13 MED ORDER — OXYCODONE-ACETAMINOPHEN 5-325 MG PO TABS
1.0000 | ORAL_TABLET | Freq: Once | ORAL | Status: AC
  Administered 2014-03-13: 1 via ORAL
  Filled 2014-03-13: qty 1

## 2014-03-13 MED ORDER — CLINDAMYCIN HCL 300 MG PO CAPS
300.0000 mg | ORAL_CAPSULE | Freq: Once | ORAL | Status: AC
  Administered 2014-03-13: 300 mg via ORAL
  Filled 2014-03-13: qty 1

## 2014-03-13 NOTE — ED Notes (Signed)
Patient stated she woke up short of breath, especially when laying down.  Patient missed her dialysis on Saturday due to work.  She is speaking full sentances, gained 20 lbs in 4 days and having increased frequency in urination.  Patient also having HA for last two days.

## 2014-03-13 NOTE — Telephone Encounter (Signed)
Has appt 03/24/14. Fleeger, Salome Spotted

## 2014-03-13 NOTE — Telephone Encounter (Signed)
Patient has not been seen in the office since June 2014. It does not appear that the requested medication has been filled since December 2014. I will give one month of refills as it appears she was supposed to restart this following a recent hospitalization. Please advise the patient that she needs to follow-up with behavioral health to discuss future treatment options.

## 2014-03-13 NOTE — Discharge Instructions (Signed)
Procedure directly to dialysis. You are at risk of sudden cardiac death with hyperkalemia and you have elected to leave. If you begin to develop symptoms of lightheadedness, chest pain, other symptoms feel free to return to the emergency department. Hyperkalemia Hyperkalemia is when you have too much potassium in your blood. This can be a life-threatening condition. Potassium is normally removed (excreted) from the body by the kidneys. CAUSES  The potassium level in your body can become too high for the following reasons:  You take in too much potassium. You can do this by:  Using salt substitutes. They contain large amounts of potassium.  Taking potassium supplements from your caregiver. The dose may be too high for you.  Eating foods or taking nutritional products with potassium.  You excrete too little potassium. This can happen if:  Your kidneys are not functioning properly. Kidney (renal) disease is a very common cause of hyperkalemia.  You are taking medicines that lower your excretion of potassium, such as certain diuretic medicines.  You have an adrenal gland disease called Addison's disease.  You have a urinary tract obstruction, such as kidney stones.  You are on treatment to mechanically clean your blood (dialysis) and you skip a treatment.  You release a high amount of potassium from your cells into your blood. You may have a condition that causes potassium to move from your cells to your bloodstream. This can happen with:  Injury to muscles or other tissues. Most potassium is stored in the muscles.  Severe burns or infections.  Acidic blood plasma (acidosis). Acidosis can result from many diseases, such as uncontrolled diabetes. SYMPTOMS  Usually, there are no symptoms unless the potassium is dangerously high or has risen very quickly. Symptoms may include:  Irregular or very slow heartbeat.  Feeling sick to your stomach (nauseous).  Tiredness (fatigue).  Nerve  problems such as tingling of the skin, numbness of the hands or feet, weakness, or paralysis. DIAGNOSIS  A simple blood test can measure the amount of potassium in your body. An electrocardiogram test of the heart can also help make the diagnosis. The heart may beat dangerously fast or slow down and stop beating with severe hyperkalemia.  TREATMENT  Treatment depends on how bad the condition is and on the underlying cause.  If the hyperkalemia is an emergency (causing heart problems or paralysis), many different medicines can be used alone or together to lower the potassium level briefly. This may include an insulin injection even if you are not diabetic. Emergency dialysis may be needed to remove potassium from the body.  If the hyperkalemia is less severe or dangerous, the underlying cause is treated. This can include taking medicines if needed. Your prescription medicines may be changed. You may also need to take a medicine to help your body get rid of potassium. You may need to eat a diet low in potassium. HOME CARE INSTRUCTIONS   Take medicines and supplements as directed by your caregiver.  Do not take any over-the-counter medicines, supplements, natural products, herbs, or vitamins without reviewing them with your caregiver. Certain supplements and natural food products can have high amounts of potassium. Other products (such as ibuprofen) can damage weak kidneys and raise your potassium.  You may be asked to do repeat lab tests. Be sure to follow these directions.  If you have kidney disease, you may need to follow a low potassium diet. SEEK MEDICAL CARE IF:   You notice an irregular or very slow heartbeat.  You feel lightheaded.  You develop weakness that is unusual for you. SEEK IMMEDIATE MEDICAL CARE IF:   You have shortness of breath.  You have chest discomfort.  You pass out (faint). MAKE SURE YOU:   Understand these instructions.  Will watch your condition.  Will  get help right away if you are not doing well or get worse. Document Released: 03/14/2002 Document Revised: 06/16/2011 Document Reviewed: 06/29/2013 [facility] Patient Information 2015 [place], [place]. This information is not intended to replace advice given to you by your health care provider. Make sure you discuss any questions you have with your health care provider.

## 2014-03-13 NOTE — ED Notes (Signed)
The pt left 30 minutes ago for dialysis.  Her usual appoiintment

## 2014-03-13 NOTE — ED Notes (Signed)
Pt does not wish to be admitted here for dialysis.  She is agrumentative and un-co-operative.  discussiion with the edp and dr Jonnie Finner who is at the bedside.
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
## 2014-03-24 ENCOUNTER — Encounter: Payer: Self-pay | Admitting: Family Medicine

## 2014-04-01 ENCOUNTER — Emergency Department (HOSPITAL_COMMUNITY)
Admission: EM | Admit: 2014-04-01 | Discharge: 2014-04-01 | Disposition: A | Discharge status: Home / Self Care | Payer: PRIVATE HEALTH INSURANCE | Attending: Emergency Medicine | Admitting: Emergency Medicine

## 2014-04-01 ENCOUNTER — Encounter (HOSPITAL_COMMUNITY): Payer: Self-pay | Admitting: *Deleted

## 2014-04-01 DIAGNOSIS — Z8719 Personal history of other diseases of the digestive system: Secondary | ICD-10-CM

## 2014-04-01 DIAGNOSIS — N186 End stage renal disease: Secondary | ICD-10-CM | POA: Diagnosis not present

## 2014-04-01 DIAGNOSIS — Z720 Tobacco use: Secondary | ICD-10-CM | POA: Insufficient documentation

## 2014-04-01 DIAGNOSIS — F29 Unspecified psychosis not due to a substance or known physiological condition: Secondary | ICD-10-CM | POA: Diagnosis not present

## 2014-04-01 DIAGNOSIS — R21 Rash and other nonspecific skin eruption: Secondary | ICD-10-CM

## 2014-04-01 DIAGNOSIS — Z792 Long term (current) use of antibiotics: Secondary | ICD-10-CM | POA: Insufficient documentation

## 2014-04-01 DIAGNOSIS — Z8739 Personal history of other diseases of the musculoskeletal system and connective tissue: Secondary | ICD-10-CM | POA: Diagnosis not present

## 2014-04-01 DIAGNOSIS — Z79899 Other long term (current) drug therapy: Secondary | ICD-10-CM | POA: Diagnosis not present

## 2014-04-01 DIAGNOSIS — Z7952 Long term (current) use of systemic steroids: Secondary | ICD-10-CM | POA: Insufficient documentation

## 2014-04-01 DIAGNOSIS — I120 Hypertensive chronic kidney disease with stage 5 chronic kidney disease or end stage renal disease: Secondary | ICD-10-CM | POA: Insufficient documentation

## 2014-04-01 LAB — CBC
HEMATOCRIT: 29.9 % — AB (ref 36.0–46.0)
Hemoglobin: 9.4 g/dL — ABNORMAL LOW (ref 12.0–15.0)
MCH: 28.4 pg (ref 26.0–34.0)
MCHC: 31.4 g/dL (ref 30.0–36.0)
MCV: 90.3 fL (ref 78.0–100.0)
PLATELETS: 178 10*3/uL (ref 150–400)
RBC: 3.31 MIL/uL — ABNORMAL LOW (ref 3.87–5.11)
RDW: 15.8 % — ABNORMAL HIGH (ref 11.5–15.5)
WBC: 4.2 10*3/uL (ref 4.0–10.5)

## 2014-04-01 LAB — BASIC METABOLIC PANEL
Anion gap: 14 (ref 5–15)
BUN: 54 mg/dL — AB (ref 6–23)
CO2: 28 mmol/L (ref 19–32)
Calcium: 9.3 mg/dL (ref 8.4–10.5)
Chloride: 98 mEq/L (ref 96–112)
Creatinine, Ser: 15.53 mg/dL — ABNORMAL HIGH (ref 0.50–1.10)
GFR, EST AFRICAN AMERICAN: 3 mL/min — AB (ref >90)
GFR, EST NON AFRICAN AMERICAN: 3 mL/min — AB (ref >90)
Glucose, Bld: 130 mg/dL — ABNORMAL HIGH (ref 70–99)
POTASSIUM: 4.2 mmol/L (ref 3.5–5.1)
Sodium: 140 mmol/L (ref 135–145)

## 2014-04-01 NOTE — Discharge Instructions (Signed)
Return to the emergency room with worsening of symptoms, new symptoms or with symptoms that are concerning, especially fevers, redness, swelling, red streaks from the area, nausea, vomiting, abdominal pain, blood in your stool. Call to make an appointment with the dermatologist. The numbers provided above. I'll make an appointment with your primary care provider. Continue to use Benadryl as well as Benadryl cream and calamine lotion, Vaseline.  Stop taking the antibiotic orally. Continue with the abx cream.  Bloody Stools Bloody stools often mean that there is a problem in the digestive tract. Your caregiver may use the term "melena" to describe black, tarry, and bad smelling stools or "hematochezia" to describe red or maroon-colored stools. Blood seen in the stool can be caused by bleeding anywhere along the intestinal tract.  A black stool usually means that blood is coming from the upper part of the gastrointestinal tract (esophagus, stomach, or small bowel). Passing maroon-colored stools or bright red blood usually means that blood is coming from lower down in the large bowel or the rectum. However, sometimes massive bleeding in the stomach or small intestine can cause bright red bloody stools.  Consuming black licorice, lead, iron pills, medicines containing bismuth subsalicylate, or blueberries can also cause black stools. Your caregiver can test black stools to see if blood is present. It is important that the cause of the bleeding be found. Treatment can then be started, and the problem can be corrected. Rectal bleeding may not be serious, but you should not assume everything is okay until you know the cause.It is very important to follow up with your caregiver or a specialist in gastrointestinal problems. CAUSES  Blood in the stools can come from various underlying causes.Often, the cause is not found during your first visit. Testing is often needed to discover the cause of bleeding in the  gastrointestinal tract. Causes range from simple to serious or even life-threatening.Possible causes include:  Hemorrhoids.These are veins that are full of blood (engorged) in the rectum. They cause pain, inflammation, and may bleed.  Anal fissures.These are areas of painful tearing which may bleed. They are often caused by passing hard stool.  Diverticulosis.These are pouches that form on the colon over time, with age, and may bleed significantly.  Diverticulitis.This is inflammation in areas with diverticulosis. It can cause pain, fever, and bloody stools, although bleeding is rare.  Proctitis and colitis. These are inflamed areas of the rectum or colon. They may cause pain, fever, and bloody stools.  Polyps and cancer. Colon cancer is a leading cause of preventable cancer death.It often starts out as precancerous polyps that can be removed during a colonoscopy, preventing progression into cancer. Sometimes, polyps and cancer may cause rectal bleeding.  Gastritis and ulcers.Bleeding from the upper gastrointestinal tract (near the stomach) may travel through the intestines and produce black, sometimes tarry, often bad smelling stools. In certain cases, if the bleeding is fast enough, the stools may not be black, but red and the condition may be life-threatening. SYMPTOMS  You may have stools that are bright red and bloody, that are normal color with blood on them, or that are dark black and tarry. In some cases, you may only have blood in the toilet bowl. Any of these cases need medical care. You may also have:  Pain at the anus or anywhere in the rectum.  Lightheadedness or feeling faint.  Extreme weakness.  Nausea or vomiting.  Fever. DIAGNOSIS Your caregiver may use the following methods to find the cause of  your bleeding:  Taking a medical history. Age is important. Older people tend to develop polyps and cancer more often. If there is anal pain and a hard, large stool  associated with bleeding, a tear of the anus may be the cause. If blood drips into the toilet after a bowel movement, bleeding hemorrhoids may be the problem. The color and frequency of the bleeding are additional considerations. In most cases, the medical history provides clues, but seldom the final answer.  A visual and finger (digital) exam. Your caregiver will inspect the anal area, looking for tears and hemorrhoids. A finger exam can provide information when there is tenderness or a growth inside. In men, the prostate is also examined.  Endoscopy. Several types of small, long scopes (endoscopes) are used to view the colon.  In the office, your caregiver may use a rigid, or more commonly, a flexible viewing sigmoidoscope. This exam is called flexible sigmoidoscopy. It is performed in 5 to 10 minutes.  A more thorough exam is accomplished with a colonoscope. It allows your caregiver to view the entire 5 to 6 foot long colon. Medicine to help you relax (sedative) is usually given for this exam. Frequently, a bleeding lesion may be present beyond the reach of the sigmoidoscope. So, a colonoscopy may be the best exam to start with. Both exams are usually done on an outpatient basis. This means the patient does not stay overnight in the hospital or surgery center.  An upper endoscopy may be needed to examine your stomach. Sedation is used and a flexible endoscope is put in your mouth, down to your stomach.  A barium enema X-ray. This is an X-ray exam. It uses liquid barium inserted by enema into the rectum. This test alone may not identify an actual bleeding point. X-rays highlight abnormal shadows, such as those made by lumps (tumors), diverticuli, or colitis. TREATMENT  Treatment depends on the cause of your bleeding.   For bleeding from the stomach or colon, the caregiver doing your endoscopy or colonoscopy may be able to stop the bleeding as part of the procedure.  Inflammation or infection of  the colon can be treated with medicines.  Many rectal problems can be treated with creams, suppositories, or warm baths.  Surgery is sometimes needed.  Blood transfusions are sometimes needed if you have lost a lot of blood.  For any bleeding problem, let your caregiver know if you take aspirin or other blood thinners regularly. HOME CARE INSTRUCTIONS   Take any medicines exactly as prescribed.  Keep your stools soft by eating a diet high in fiber. Prunes (1 to 3 a day) work well for many people.  Drink enough water and fluids to keep your urine clear or pale yellow.  Take sitz baths if advised. A sitz bath is when you sit in a bathtub with warm water for 10 to 15 minutes to soak, soothe, and cleanse the rectal area.  If enemas or suppositories are advised, be sure you know how to use them. Tell your caregiver if you have problems with this.  Monitor your bowel movements to look for signs of improvement or worsening. SEEK MEDICAL CARE IF:   You do not improve in the time expected.  Your condition worsens after initial improvement.  You develop any new symptoms. SEEK IMMEDIATE MEDICAL CARE IF:   You develop severe or prolonged rectal bleeding.  You vomit blood.  You feel weak or faint.  You have a fever. MAKE SURE YOU:  Understand these instructions.  Will watch your condition.  Will get help right away if you are not doing well or get worse. Document Released: 03/14/2002 Document Revised: 06/16/2011 Document Reviewed: 08/09/2010 [facility] Patient Information 2015 [place], [place]. This information is not intended to replace advice given to you by your health care provider. Make sure you discuss any questions you have with your health care provider.  Allergies Allergies may happen from anything your body is sensitive to. This may be food, medicines, pollens, chemicals, and nearly anything around you in everyday life that produces allergens. An allergen is anything that  causes an allergy producing substance. Heredity is often a factor in causing these problems. This means you may have some of the same allergies as your parents. Food allergies happen in all age groups. Food allergies are some of the most severe and life threatening. Some common food allergies are cow's milk, seafood, eggs, nuts, wheat, and soybeans. SYMPTOMS   Swelling around the mouth.  An itchy red rash or hives.  Vomiting or diarrhea.  Difficulty breathing. SEVERE ALLERGIC REACTIONS ARE LIFE-THREATENING. This reaction is called anaphylaxis. It can cause the mouth and throat to swell and cause difficulty with breathing and swallowing. In severe reactions only a trace amount of food (for example, peanut oil in a salad) may cause death within seconds. Seasonal allergies occur in all age groups. These are seasonal because they usually occur during the same season every year. They may be a reaction to molds, grass pollens, or tree pollens. Other causes of problems are house dust mite allergens, pet dander, and mold spores. The symptoms often consist of nasal congestion, a runny itchy nose associated with sneezing, and tearing itchy eyes. There is often an associated itching of the mouth and ears. The problems happen when you come in contact with pollens and other allergens. Allergens are the particles in the air that the body reacts to with an allergic reaction. This causes you to release allergic antibodies. Through a chain of events, these eventually cause you to release histamine into the blood stream. Although it is meant to be protective to the body, it is this release that causes your discomfort. This is why you were given anti-histamines to feel better. If you are unable to pinpoint the offending allergen, it may be determined by skin or blood testing. Allergies cannot be cured but can be controlled with medicine. Hay fever is a collection of all or some of the seasonal allergy problems. It may  often be treated with simple over-the-counter medicine such as diphenhydramine. Take medicine as directed. Do not drink alcohol or drive while taking this medicine. Check with your caregiver or package insert for child dosages. If these medicines are not effective, there are many new medicines your caregiver can prescribe. Stronger medicine such as nasal spray, eye drops, and corticosteroids may be used if the first things you try do not work well. Other treatments such as immunotherapy or desensitizing injections can be used if all else fails. Follow up with your caregiver if problems continue. These seasonal allergies are usually not life threatening. They are generally more of a nuisance that can often be handled using medicine. HOME CARE INSTRUCTIONS   If unsure what causes a reaction, keep a diary of foods eaten and symptoms that follow. Avoid foods that cause reactions.  If hives or rash are present:  Take medicine as directed.  You may use an over-the-counter antihistamine (diphenhydramine) for hives and itching as needed.  Apply  cold compresses (cloths) to the skin or take baths in cool water. Avoid hot baths or showers. Heat will make a rash and itching worse.  If you are severely allergic:  Following a treatment for a severe reaction, hospitalization is often required for closer follow-up.  Wear a medic-alert bracelet or necklace stating the allergy.  You and your family must learn how to give adrenaline or use an anaphylaxis kit.  If you have had a severe reaction, always carry your anaphylaxis kit or EpiPen with you. Use this medicine as directed by your caregiver if a severe reaction is occurring. Failure to do so could have a fatal outcome. SEEK MEDICAL CARE IF:  You suspect a food allergy. Symptoms generally happen within 30 minutes of eating a food.  Your symptoms have not gone away within 2 days or are getting worse.  You develop new symptoms.  You want to retest  yourself or your child with a food or drink you think causes an allergic reaction. Never do this if an anaphylactic reaction to that food or drink has happened before. Only do this under the care of a caregiver. SEEK IMMEDIATE MEDICAL CARE IF:   You have difficulty breathing, are wheezing, or have a tight feeling in your chest or throat.  You have a swollen mouth, or you have hives, swelling, or itching all over your body.  You have had a severe reaction that has responded to your anaphylaxis kit or an EpiPen. These reactions may return when the medicine has worn off. These reactions should be considered life threatening. MAKE SURE YOU:   Understand these instructions.  Will watch your condition.  Will get help right away if you are not doing well or get worse. Document Released: 06/17/2002 Document Revised: 07/19/2012 Document Reviewed: 11/22/2007 [facility] Patient Information 2015 [place], [place]. This information is not intended to replace advice given to you by your health care provider. Make sure you discuss any questions you have with your health care provider.
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
## 2014-04-01 NOTE — ED Notes (Signed)
Pt has multiple complaints. Dialysis pt, last treatment was Thursday, is scheduled to go tomorrow. Reports popping a pimple on her right side of face and now having swelling to face. Reports also having multiple bumps on her legs and has been started on antibiotics, pt stooped taking it due to noticing blood and mucus in her stools. No acute distress noted at triage.

## 2014-04-11 ENCOUNTER — Encounter (HOSPITAL_COMMUNITY): Payer: Self-pay

## 2014-04-11 ENCOUNTER — Emergency Department (HOSPITAL_COMMUNITY): Payer: Medicare Other

## 2014-04-11 ENCOUNTER — Emergency Department (HOSPITAL_COMMUNITY)
Admission: EM | Admit: 2014-04-11 | Discharge: 2014-04-11 | Disposition: A | Discharge status: Home / Self Care | Payer: Medicare Other | Attending: Emergency Medicine | Admitting: Emergency Medicine

## 2014-04-11 DIAGNOSIS — R600 Localized edema: Secondary | ICD-10-CM | POA: Diagnosis present

## 2014-04-11 DIAGNOSIS — Z792 Long term (current) use of antibiotics: Secondary | ICD-10-CM | POA: Insufficient documentation

## 2014-04-11 DIAGNOSIS — L0390 Cellulitis, unspecified: Secondary | ICD-10-CM

## 2014-04-11 DIAGNOSIS — R079 Chest pain, unspecified: Secondary | ICD-10-CM

## 2014-04-11 DIAGNOSIS — F29 Unspecified psychosis not due to a substance or known physiological condition: Secondary | ICD-10-CM | POA: Insufficient documentation

## 2014-04-11 DIAGNOSIS — L989 Disorder of the skin and subcutaneous tissue, unspecified: Secondary | ICD-10-CM | POA: Diagnosis not present

## 2014-04-11 DIAGNOSIS — I120 Hypertensive chronic kidney disease with stage 5 chronic kidney disease or end stage renal disease: Secondary | ICD-10-CM | POA: Insufficient documentation

## 2014-04-11 DIAGNOSIS — R21 Rash and other nonspecific skin eruption: Secondary | ICD-10-CM

## 2014-04-11 DIAGNOSIS — Z79899 Other long term (current) drug therapy: Secondary | ICD-10-CM | POA: Diagnosis not present

## 2014-04-11 DIAGNOSIS — N186 End stage renal disease: Secondary | ICD-10-CM | POA: Insufficient documentation

## 2014-04-11 DIAGNOSIS — Z992 Dependence on renal dialysis: Secondary | ICD-10-CM | POA: Diagnosis not present

## 2014-04-11 DIAGNOSIS — Z720 Tobacco use: Secondary | ICD-10-CM | POA: Diagnosis not present

## 2014-04-11 DIAGNOSIS — L03115 Cellulitis of right lower limb: Secondary | ICD-10-CM | POA: Insufficient documentation

## 2014-04-11 DIAGNOSIS — B9562 Methicillin resistant Staphylococcus aureus infection as the cause of diseases classified elsewhere: Secondary | ICD-10-CM | POA: Insufficient documentation

## 2014-04-11 LAB — CBC
HCT: 28.9 % — ABNORMAL LOW (ref 36.0–46.0)
Hemoglobin: 8.9 g/dL — ABNORMAL LOW (ref 12.0–15.0)
MCH: 26.9 pg (ref 26.0–34.0)
MCHC: 30.8 g/dL (ref 30.0–36.0)
MCV: 87.3 fL (ref 78.0–100.0)
PLATELETS: 202 10*3/uL (ref 150–400)
RBC: 3.31 MIL/uL — AB (ref 3.87–5.11)
RDW: 14.9 % (ref 11.5–15.5)
WBC: 3.5 10*3/uL — ABNORMAL LOW (ref 4.0–10.5)

## 2014-04-11 LAB — BASIC METABOLIC PANEL
Anion gap: 15 (ref 5–15)
BUN: 62 mg/dL — AB (ref 6–23)
CHLORIDE: 97 meq/L (ref 96–112)
CO2: 28 mmol/L (ref 19–32)
CREATININE: 16.44 mg/dL — AB (ref 0.50–1.10)
Calcium: 9.3 mg/dL (ref 8.4–10.5)
GFR calc non Af Amer: 2 mL/min — ABNORMAL LOW (ref >90)
GFR, EST AFRICAN AMERICAN: 3 mL/min — AB (ref >90)
Glucose, Bld: 95 mg/dL (ref 70–99)
Potassium: 4.3 mmol/L (ref 3.5–5.1)
Sodium: 140 mmol/L (ref 135–145)

## 2014-04-11 LAB — I-STAT TROPONIN, ED: Troponin i, poc: 0.02 ng/mL (ref 0.00–0.08)

## 2014-04-11 MED ORDER — DOXYCYCLINE HYCLATE 100 MG PO CAPS: 100.0000 mg | ORAL_CAPSULE | Freq: Two times a day (BID) | ORAL | Status: DC

## 2014-04-11 NOTE — ED Notes (Signed)
Pt was seen here the other day for a bump on right cheek and referred to dermatologist but not able to see them till next week. It popped yesterday and is now swollen. Pt is a dialysis pt and missed her appt this morning and was wondering if we could get her in if she could do it here, if not her clinic can see her tomorrow. Reports chest pain the past two days as well and wheezing. Wants to make sure she doesn't have too much fluid on her.
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
## 2014-04-11 NOTE — ED Notes (Signed)
Pt ambulated at a quick pace from lobby to room.  No exertional SOB noted during ambulation or on arrival to room.

## 2014-04-11 NOTE — Discharge Instructions (Signed)
It was our pleasure to provide your ER care today - we hope that you feel better.  Keep skin clean and dry.  Avoid any perfumed soaps/detergents.   For area on face, try warm compresses to sore area, keep very clean. Take doxycycline (antibiotic) as prescribed.  You may take benadryl as need if itching.  Your blood pressure is high today - go to dialyses tomorrow, limit salt intake, and follow up with your doctors for recheck of blood pressure post dialyses tomorrow.   Follow up with dermatologist as planned in coming week.  Go to your dialyses center tomorrow morning for dialyses, after missing today's session - you may want to call there now to arrange time.  Return to ER if worse, new symptoms, fevers, spreading redness, trouble breathing, other concern.       Community-Associated MRSA CA-MRSA stands for community-associated methicillin-resistant Staphylococcus aureus. MRSA is a type of bacteria that is resistant to some common antibiotics. It can cause infections in the skin and many other places in the body. Staphylococcus aureus, often called "staph," is a bacteria that normally lives on the skin or in the nose. Staph on the surface of the skin or in the nose does not cause problems. However, if the staph enters the body through a cut, wound, or break in the skin, an infection can happen. Up until recently, infections with the MRSA type of staph mainly occurred in hospitals and other health care settings. There are now increasing problems with MRSA infections in the community as well. Infections with MRSA may be very serious or even life threatening. CA-MRSA is becoming more common. It is known to spread in crowded settings, in jails and prisons, and in situations where there is close skin-to-skin contact, such as during sporting events or in locker rooms. MRSA can be spread through shared items, such as children's toys, razors, towels, or sports equipment.  CAUSES All staph,  including MRSA, are normally harmless unless they enter the body through a scratch, cut, or wound, such as with surgery. All staph, including MRSA, can be spread from person-to-person by touching contaminated objects or through direct contact.  MRSA now causes illness in people who have not been in hospitals or other health care facilities. Cases of MRSA diseases in the community have been associated with:  Recent antibiotic use.  Sharing contaminated towels or clothes.  Having active skin diseases.  Participating in contact sports.  Living in crowded settings.  Intravenous (IV) drug use.  Community-associated MRSA infections are usually skin infections, but may cause other severe illnesses.  Staph bacteria are one of the most common causes of skin infection. However, they are also a common cause of pneumonia, bone or joint infections, and bloodstream infections. DIAGNOSIS Diagnosis of MRSA is done by cultures of fluid samples that may come from:  Swabs taken from cuts or wounds in infected areas.  Nasal swabs.  Saliva or deep cough specimens from the lungs (sputum).  Urine.  Blood. Many people are "colonized" with MRSA but have no signs of infection. This means that people carry the MRSA germ on their skin or in their nose and may never develop MRSA infection.  TREATMENT  Treatment varies and is based on how serious, how deep, or how extensive the infection is. For example:  Some skin infections, such as a small boil or abscess, may be treated by draining yellowish-white fluid (pus) from the site of the infection.  Deeper or more widespread soft tissue infections are  usually treated with surgery to drain pus and with antibiotic medicine given by vein or by mouth. This may be recommended even if you are pregnant.  Serious infections may require a hospital stay. If antibiotics are given, they may be needed for several weeks. PREVENTION Because many people are colonized with  staph, including MRSA, preventing the spread of the bacteria from person-to-person is most important. The best way to prevent the spread of bacteria and other germs is through proper hand washing or by using alcohol-based hand disinfectants. The following are other ways to help prevent MRSA infection within community settings.   Wash your hands frequently with soap and water for at least 15 seconds. Otherwise, use alcohol-based hand disinfectants when soap and water is not available.  Make sure people who live with you wash their hands often, too.  Do not share personal items. For example, avoid sharing razors and other personal hygiene items, towels, clothing, and athletic equipment.  Wash and dry your clothes and bedding at the warmest temperatures recommended on the labels.  Keep wounds covered. Pus from infected sores may contain MRSA and other bacteria. Keep cuts and abrasions clean and covered with germ-free (sterile), dry bandages until they are healed.  If you have a wound that appears infected, ask your caregiver if a culture for MRSA and other bacteria should be done.  If you are breastfeeding, talk to your caregiver about MRSA. You may be asked to temporarily stop breastfeeding. HOME CARE INSTRUCTIONS   Take your antibiotics as directed. Finish them even if you start to feel better.  Avoid close contact with those around you as much as possible. Do not use towels, razors, toothbrushes, bedding, or other items that will be used by others.  To fight the infection, follow your caregiver's instructions for wound care. Wash your hands before and after changing your bandages.  If you have an intravascular device, such as a catheter, make sure you know how to care for it.  Be sure to tell any health care providers that you have MRSA so they are aware of your infection. SEEK IMMEDIATE MEDICAL CARE IF:  The infection appears to be getting worse. Signs include:  Increased warmth,  redness, or tenderness around the wound site.  A red line that extends from the infection site.  A dark color in the area around the infection.  Wound drainage that is tan, yellow, or green.  A bad smell coming from the wound.  You feel sick to your stomach (nauseous) and throw up (vomit) or cannot keep medicine down.  You have a fever.  Your baby is older than 3 months with a rectal temperature of 102F (38.9C) or higher.  Your baby is 52 months old or younger with a rectal temperature of 100.33F (38C) or higher.  You have difficulty breathing. MAKE SURE YOU:   Understand these instructions.  Will watch your condition.  Will get help right away if you are not doing well or get worse. Document Released: 06/27/2005 Document Revised: 08/08/2013 Document Reviewed: 06/27/2010 [employer] Patient Information 2015 [place], [place]. This information is not intended to replace advice given to you by your health care provider. Make sure you discuss any questions you have with your health care provider.    Hypertension Hypertension, commonly called high blood pressure, is when the force of blood pumping through your arteries is too strong. Your arteries are the blood vessels that carry blood from your heart throughout your body. A blood pressure reading consists  of a higher number over a lower number, such as 110/72. The higher number (systolic) is the pressure inside your arteries when your heart pumps. The lower number (diastolic) is the pressure inside your arteries when your heart relaxes. Ideally you want your blood pressure below 120/80. Hypertension forces your heart to work harder to pump blood. Your arteries may become narrow or stiff. Having hypertension puts you at risk for heart disease, stroke, and other problems.  RISK FACTORS Some risk factors for high blood pressure are controllable. Others are not.  Risk factors you cannot control include:   Race. You may be at higher risk  if you are African American.  Age. Risk increases with age.  Gender. Men are at higher risk than women before age 66 years. After age 15, women are at higher risk than men. Risk factors you can control include:  Not getting enough exercise or physical activity.  Being overweight.  Getting too much fat, sugar, calories, or salt in your diet.  Drinking too much alcohol. SIGNS AND SYMPTOMS Hypertension does not usually cause signs or symptoms. Extremely high blood pressure (hypertensive crisis) may cause headache, anxiety, shortness of breath, and nosebleed. DIAGNOSIS  To check if you have hypertension, your health care provider will measure your blood pressure while you are seated, with your arm held at the level of your heart. It should be measured at least twice using the same arm. Certain conditions can cause a difference in blood pressure between your right and left arms. A blood pressure reading that is higher than normal on one occasion does not mean that you need treatment. If one blood pressure reading is high, ask your health care provider about having it checked again. TREATMENT  Treating high blood pressure includes making lifestyle changes and possibly taking medicine. Living a healthy lifestyle can help lower high blood pressure. You may need to change some of your habits. Lifestyle changes may include:  Following the DASH diet. This diet is high in fruits, vegetables, and whole grains. It is low in salt, red meat, and added sugars.  Getting at least 2 hours of brisk physical activity every week.  Losing weight if necessary.  Not smoking.  Limiting alcoholic beverages.  Learning ways to reduce stress. If lifestyle changes are not enough to get your blood pressure under control, your health care provider may prescribe medicine. You may need to take more than one. Work closely with your health care provider to understand the risks and benefits. HOME CARE  INSTRUCTIONS  Have your blood pressure rechecked as directed by your health care provider.   Take medicines only as directed by your health care provider. Follow the directions carefully. Blood pressure medicines must be taken as prescribed. The medicine does not work as well when you skip doses. Skipping doses also puts you at risk for problems.   Do not smoke.   Monitor your blood pressure at home as directed by your health care provider. SEEK MEDICAL CARE IF:   You think you are having a reaction to medicines taken.  You have recurrent headaches or feel dizzy.  You have swelling in your ankles.  You have trouble with your vision. SEEK IMMEDIATE MEDICAL CARE IF:  You develop a severe headache or confusion.  You have unusual weakness, numbness, or feel faint.  You have severe chest or abdominal pain.  You vomit repeatedly.  You have trouble breathing. MAKE SURE YOU:   Understand these instructions.  Will watch your condition.  Will get help right away if you are not doing well or get worse. Document Released: 03/24/2005 Document Revised: 08/08/2013 Document Reviewed: 01/14/2013 [facility] Patient Information 2015 [place], [place]. This information is not intended to replace advice given to you by your health care provider. Make sure you discuss any questions you have with your health care provider.

## 2014-05-03 ENCOUNTER — Encounter (HOSPITAL_COMMUNITY): Payer: Self-pay

## 2014-05-03 ENCOUNTER — Emergency Department (HOSPITAL_COMMUNITY): Payer: Medicare Other

## 2014-05-03 ENCOUNTER — Emergency Department (HOSPITAL_COMMUNITY)
Admission: EM | Admit: 2014-05-03 | Discharge: 2014-05-03 | Disposition: A | Discharge status: Home / Self Care | Payer: Medicare Other | Attending: Emergency Medicine | Admitting: Emergency Medicine

## 2014-05-03 DIAGNOSIS — Z8614 Personal history of Methicillin resistant Staphylococcus aureus infection: Secondary | ICD-10-CM | POA: Insufficient documentation

## 2014-05-03 DIAGNOSIS — Z79899 Other long term (current) drug therapy: Secondary | ICD-10-CM | POA: Diagnosis not present

## 2014-05-03 DIAGNOSIS — F209 Schizophrenia, unspecified: Secondary | ICD-10-CM | POA: Diagnosis not present

## 2014-05-03 DIAGNOSIS — Z7952 Long term (current) use of systemic steroids: Secondary | ICD-10-CM | POA: Diagnosis not present

## 2014-05-03 DIAGNOSIS — Z992 Dependence on renal dialysis: Secondary | ICD-10-CM | POA: Insufficient documentation

## 2014-05-03 DIAGNOSIS — Z720 Tobacco use: Secondary | ICD-10-CM | POA: Diagnosis not present

## 2014-05-03 DIAGNOSIS — I120 Hypertensive chronic kidney disease with stage 5 chronic kidney disease or end stage renal disease: Secondary | ICD-10-CM | POA: Diagnosis not present

## 2014-05-03 DIAGNOSIS — F29 Unspecified psychosis not due to a substance or known physiological condition: Secondary | ICD-10-CM | POA: Insufficient documentation

## 2014-05-03 DIAGNOSIS — Z8739 Personal history of other diseases of the musculoskeletal system and connective tissue: Secondary | ICD-10-CM | POA: Diagnosis not present

## 2014-05-03 DIAGNOSIS — N186 End stage renal disease: Secondary | ICD-10-CM | POA: Insufficient documentation

## 2014-05-03 DIAGNOSIS — L739 Follicular disorder, unspecified: Secondary | ICD-10-CM | POA: Diagnosis not present

## 2014-05-03 DIAGNOSIS — Z792 Long term (current) use of antibiotics: Secondary | ICD-10-CM | POA: Diagnosis not present

## 2014-05-03 DIAGNOSIS — R21 Rash and other nonspecific skin eruption: Secondary | ICD-10-CM | POA: Diagnosis present

## 2014-05-03 LAB — CBC WITH DIFFERENTIAL/PLATELET
Basophils Absolute: 0 10*3/uL (ref 0.0–0.1)
Basophils Relative: 1 % (ref 0–1)
Eosinophils Absolute: 0.1 10*3/uL (ref 0.0–0.7)
Eosinophils Relative: 3 % (ref 0–5)
HEMATOCRIT: 37 % (ref 36.0–46.0)
HEMOGLOBIN: 11.9 g/dL — AB (ref 12.0–15.0)
Lymphocytes Relative: 31 % (ref 12–46)
Lymphs Abs: 1.1 10*3/uL (ref 0.7–4.0)
MCH: 28.3 pg (ref 26.0–34.0)
MCHC: 32.2 g/dL (ref 30.0–36.0)
MCV: 88.1 fL (ref 78.0–100.0)
MONOS PCT: 9 % (ref 3–12)
Monocytes Absolute: 0.3 10*3/uL (ref 0.1–1.0)
Neutro Abs: 2.1 10*3/uL (ref 1.7–7.7)
Neutrophils Relative %: 57 % (ref 43–77)
PLATELETS: 183 10*3/uL (ref 150–400)
RBC: 4.2 MIL/uL (ref 3.87–5.11)
RDW: 17.1 % — ABNORMAL HIGH (ref 11.5–15.5)
WBC: 3.7 10*3/uL — AB (ref 4.0–10.5)

## 2014-05-03 LAB — COMPREHENSIVE METABOLIC PANEL
ALT: 14 U/L (ref 0–35)
AST: 18 U/L (ref 0–37)
Albumin: 3.5 g/dL (ref 3.5–5.2)
Alkaline Phosphatase: 55 U/L (ref 39–117)
Anion gap: 15 (ref 5–15)
BILIRUBIN TOTAL: 0.8 mg/dL (ref 0.3–1.2)
BUN: 84 mg/dL — ABNORMAL HIGH (ref 6–23)
CO2: 23 mmol/L (ref 19–32)
Calcium: 9.8 mg/dL (ref 8.4–10.5)
Chloride: 101 mmol/L (ref 96–112)
Creatinine, Ser: 19.69 mg/dL — ABNORMAL HIGH (ref 0.50–1.10)
GFR calc Af Amer: 2 mL/min — ABNORMAL LOW (ref >90)
GFR, EST NON AFRICAN AMERICAN: 2 mL/min — AB (ref >90)
GLUCOSE: 82 mg/dL (ref 70–99)
Potassium: 4.8 mmol/L (ref 3.5–5.1)
SODIUM: 139 mmol/L (ref 135–145)
Total Protein: 7 g/dL (ref 6.0–8.3)

## 2014-05-03 MED ORDER — DOXYCYCLINE HYCLATE 100 MG PO CAPS: 100.0000 mg | ORAL_CAPSULE | Freq: Two times a day (BID) | ORAL | Status: DC

## 2014-05-03 NOTE — ED Notes (Signed)
Pt reports wound from former dialysis catheter on her chest is now open. Pt also reports rash on her bottom, she saw a dermatologist last week. Pt also concerned she may need dialysis.

## 2014-05-03 NOTE — Discharge Instructions (Signed)
Please follow up with your doctor for recheck. Report to your normal dialysis tomorrow. Your blood work and chest xray are normal today.   Folliculitis  Folliculitis is redness, soreness, and swelling (inflammation) of the hair follicles. This condition can occur anywhere on the body. People with weakened immune systems, diabetes, or obesity have a greater risk of getting folliculitis. CAUSES  Bacterial infection. This is the most common cause.  Fungal infection.  Viral infection.  Contact with certain chemicals, especially oils and tars. Long-term folliculitis can result from bacteria that live in the nostrils. The bacteria may trigger multiple outbreaks of folliculitis over time. SYMPTOMS Folliculitis most commonly occurs on the scalp, thighs, legs, back, buttocks, and areas where hair is shaved frequently. An early sign of folliculitis is a small, white or yellow, pus-filled, itchy lesion (pustule). These lesions appear on a red, inflamed follicle. They are usually less than 0.2 inches (5 mm) wide. When there is an infection of the follicle that goes deeper, it becomes a boil or furuncle. A group of closely packed boils creates a larger lesion (carbuncle). Carbuncles tend to occur in hairy, sweaty areas of the body. DIAGNOSIS  Your caregiver can usually tell what is wrong by doing a physical exam. A sample may be taken from one of the lesions and tested in a lab. This can help determine what is causing your folliculitis. TREATMENT  Treatment may include:  Applying warm compresses to the affected areas.  Taking antibiotic medicines orally or applying them to the skin.  Draining the lesions if they contain a large amount of pus or fluid.  Laser hair removal for cases of long-lasting folliculitis. This helps to prevent regrowth of the hair. HOME CARE INSTRUCTIONS  Apply warm compresses to the affected areas as directed by your caregiver.  If antibiotics are prescribed, take them as  directed. Finish them even if you start to feel better.  You may take over-the-counter medicines to relieve itching.  Do not shave irritated skin.  Follow up with your caregiver as directed. SEEK IMMEDIATE MEDICAL CARE IF:   You have increasing redness, swelling, or pain in the affected area.  You have a fever. MAKE SURE YOU:  Understand these instructions.  Will watch your condition.  Will get help right away if you are not doing well or get worse. Document Released: 06/02/2001 Document Revised: 09/23/2011 Document Reviewed: 06/24/2011 [facility] Patient Information 2015 [place], [place]. This information is not intended to replace advice given to you by your health care provider. Make sure you discuss any questions you have with your health care provider.
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
## 2014-05-06 ENCOUNTER — Emergency Department (HOSPITAL_COMMUNITY): Payer: 59

## 2014-05-06 ENCOUNTER — Encounter (HOSPITAL_COMMUNITY): Payer: Self-pay | Admitting: *Deleted

## 2014-05-06 DIAGNOSIS — Z720 Tobacco use: Secondary | ICD-10-CM | POA: Diagnosis not present

## 2014-05-06 DIAGNOSIS — E8770 Fluid overload, unspecified: Secondary | ICD-10-CM | POA: Insufficient documentation

## 2014-05-06 DIAGNOSIS — Z7952 Long term (current) use of systemic steroids: Secondary | ICD-10-CM | POA: Diagnosis not present

## 2014-05-06 DIAGNOSIS — Z79899 Other long term (current) drug therapy: Secondary | ICD-10-CM | POA: Diagnosis not present

## 2014-05-06 DIAGNOSIS — Z792 Long term (current) use of antibiotics: Secondary | ICD-10-CM | POA: Insufficient documentation

## 2014-05-06 DIAGNOSIS — F29 Unspecified psychosis not due to a substance or known physiological condition: Secondary | ICD-10-CM | POA: Insufficient documentation

## 2014-05-06 DIAGNOSIS — R000 Tachycardia, unspecified: Secondary | ICD-10-CM | POA: Diagnosis not present

## 2014-05-06 DIAGNOSIS — Z8739 Personal history of other diseases of the musculoskeletal system and connective tissue: Secondary | ICD-10-CM | POA: Diagnosis not present

## 2014-05-06 DIAGNOSIS — I10 Essential (primary) hypertension: Secondary | ICD-10-CM | POA: Diagnosis not present

## 2014-05-06 DIAGNOSIS — R0602 Shortness of breath: Secondary | ICD-10-CM | POA: Diagnosis present

## 2014-05-06 LAB — BASIC METABOLIC PANEL
ANION GAP: 15 (ref 5–15)
BUN: 111 mg/dL — ABNORMAL HIGH (ref 6–23)
CALCIUM: 9.1 mg/dL (ref 8.4–10.5)
CHLORIDE: 102 mmol/L (ref 96–112)
CO2: 20 mmol/L (ref 19–32)
CREATININE: 22.46 mg/dL — AB (ref 0.50–1.10)
GFR calc non Af Amer: 2 mL/min — ABNORMAL LOW (ref >90)
GFR, EST AFRICAN AMERICAN: 2 mL/min — AB (ref >90)
GLUCOSE: 91 mg/dL (ref 70–99)
Potassium: 4.8 mmol/L (ref 3.5–5.1)
Sodium: 137 mmol/L (ref 135–145)

## 2014-05-06 NOTE — ED Notes (Signed)
The pt is c/o sob today.  She has not been dialyzed for one week.  She has been working.  She showed up ay dialysis today but she was late and they would not take her.  She feels like she has fluid  Build-up.  She doies not feel she can wait for Tuesday to be dialyzed.  Hurting all over

## 2014-05-07 ENCOUNTER — Emergency Department (HOSPITAL_COMMUNITY)
Admission: EM | Admit: 2014-05-07 | Discharge: 2014-05-07 | Disposition: A | Discharge status: Home / Self Care | Payer: 59 | Attending: Emergency Medicine | Admitting: Emergency Medicine

## 2014-05-07 DIAGNOSIS — E8779 Other fluid overload: Secondary | ICD-10-CM

## 2014-05-07 DIAGNOSIS — E8770 Fluid overload, unspecified: Secondary | ICD-10-CM | POA: Diagnosis not present

## 2014-05-07 LAB — BRAIN NATRIURETIC PEPTIDE: B Natriuretic Peptide: 2281.3 pg/mL — ABNORMAL HIGH (ref 0.0–100.0)

## 2014-05-07 LAB — CBC
HCT: 33.2 % — ABNORMAL LOW (ref 36.0–46.0)
HEMOGLOBIN: 10.8 g/dL — AB (ref 12.0–15.0)
MCH: 28.1 pg (ref 26.0–34.0)
MCHC: 32.5 g/dL (ref 30.0–36.0)
MCV: 86.5 fL (ref 78.0–100.0)
PLATELETS: 184 10*3/uL (ref 150–400)
RBC: 3.84 MIL/uL — ABNORMAL LOW (ref 3.87–5.11)
RDW: 17.4 % — ABNORMAL HIGH (ref 11.5–15.5)
WBC: 4.7 10*3/uL (ref 4.0–10.5)

## 2014-05-07 LAB — HEPATITIS B SURFACE ANTIGEN: HEP B S AG: NEGATIVE

## 2014-05-07 MED ORDER — HEPARIN SODIUM (PORCINE) 1000 UNIT/ML DIALYSIS: 1000.0000 [IU] | INTRAMUSCULAR | Status: DC | PRN

## 2014-05-07 MED ORDER — ACETAMINOPHEN 325 MG PO TABS
650.0000 mg | ORAL_TABLET | Freq: Once | ORAL | Status: AC
  Administered 2014-05-07: 650 mg via ORAL
  Filled 2014-05-07: qty 2

## 2014-05-07 MED ORDER — ALTEPLASE 2 MG IJ SOLR: 2.0000 mg | Freq: Once | INTRAMUSCULAR | Status: DC | PRN

## 2014-05-07 MED ORDER — ACETAMINOPHEN 325 MG PO TABS
650.0000 mg | ORAL_TABLET | Freq: Once | ORAL | Status: AC
  Administered 2014-05-07: 650 mg via ORAL

## 2014-05-07 MED ORDER — PENTAFLUOROPROP-TETRAFLUOROETH EX AERO: 1.0000 "application " | INHALATION_SPRAY | CUTANEOUS | Status: DC | PRN

## 2014-05-07 MED ORDER — SODIUM CHLORIDE 0.9 % IV SOLN: 100.0000 mL | INTRAVENOUS | Status: DC | PRN

## 2014-05-07 MED ORDER — LIDOCAINE-PRILOCAINE 2.5-2.5 % EX CREA: 1.0000 "application " | TOPICAL_CREAM | CUTANEOUS | Status: DC | PRN

## 2014-05-07 MED ORDER — NEPRO/CARBSTEADY PO LIQD: 237.0000 mL | ORAL | Status: DC | PRN

## 2014-05-07 MED ORDER — ACETAMINOPHEN 325 MG PO TABS
ORAL_TABLET | ORAL | Status: AC
  Filled 2014-05-07: qty 2

## 2014-05-07 MED ORDER — HEPARIN SODIUM (PORCINE) 1000 UNIT/ML DIALYSIS: 6000.0000 [IU] | Freq: Once | INTRAMUSCULAR | Status: DC

## 2014-05-07 MED ORDER — LIDOCAINE HCL (PF) 1 % IJ SOLN: 5.0000 mL | INTRAMUSCULAR | Status: DC | PRN

## 2014-05-07 NOTE — ED Notes (Signed)
Breakfast at bedside.

## 2014-05-07 NOTE — ED Notes (Addendum)
Nephrologist at bedside. Pt next on the list.

## 2014-05-07 NOTE — Procedures (Signed)
I was present at this dialysis session, have reviewed the session itself and made  appropriate changes  Kelly Splinter MD (pgr) [phone]    [phone] 05/07/2014, 11:34 AM
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
## 2014-05-07 NOTE — ED Notes (Signed)
RN called dialysis; pt will be heading there around 11.
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
## 2014-05-23 ENCOUNTER — Emergency Department (HOSPITAL_COMMUNITY): Payer: Medicare Other

## 2014-05-23 ENCOUNTER — Encounter (HOSPITAL_COMMUNITY): Payer: Self-pay | Admitting: Adult Health

## 2014-05-23 ENCOUNTER — Observation Stay (HOSPITAL_COMMUNITY)
Admission: EM | Admit: 2014-05-23 | Discharge: 2014-05-25 | Disposition: A | Discharge status: Home / Self Care | Payer: Medicare Other | Attending: Family Medicine | Admitting: Family Medicine

## 2014-05-23 DIAGNOSIS — F319 Bipolar disorder, unspecified: Secondary | ICD-10-CM | POA: Insufficient documentation

## 2014-05-23 DIAGNOSIS — Z992 Dependence on renal dialysis: Secondary | ICD-10-CM | POA: Insufficient documentation

## 2014-05-23 DIAGNOSIS — M329 Systemic lupus erythematosus, unspecified: Secondary | ICD-10-CM | POA: Diagnosis not present

## 2014-05-23 DIAGNOSIS — K529 Noninfective gastroenteritis and colitis, unspecified: Secondary | ICD-10-CM | POA: Diagnosis not present

## 2014-05-23 DIAGNOSIS — F17210 Nicotine dependence, cigarettes, uncomplicated: Secondary | ICD-10-CM | POA: Insufficient documentation

## 2014-05-23 DIAGNOSIS — R0789 Other chest pain: Secondary | ICD-10-CM | POA: Diagnosis not present

## 2014-05-23 DIAGNOSIS — R072 Precordial pain: Secondary | ICD-10-CM

## 2014-05-23 DIAGNOSIS — R000 Tachycardia, unspecified: Secondary | ICD-10-CM | POA: Insufficient documentation

## 2014-05-23 DIAGNOSIS — Z3490 Encounter for supervision of normal pregnancy, unspecified, unspecified trimester: Secondary | ICD-10-CM

## 2014-05-23 DIAGNOSIS — R7989 Other specified abnormal findings of blood chemistry: Secondary | ICD-10-CM | POA: Insufficient documentation

## 2014-05-23 DIAGNOSIS — Z331 Pregnant state, incidental: Secondary | ICD-10-CM

## 2014-05-23 DIAGNOSIS — I120 Hypertensive chronic kidney disease with stage 5 chronic kidney disease or end stage renal disease: Secondary | ICD-10-CM | POA: Insufficient documentation

## 2014-05-23 DIAGNOSIS — R079 Chest pain, unspecified: Secondary | ICD-10-CM | POA: Diagnosis present

## 2014-05-23 DIAGNOSIS — F209 Schizophrenia, unspecified: Secondary | ICD-10-CM | POA: Insufficient documentation

## 2014-05-23 DIAGNOSIS — N186 End stage renal disease: Secondary | ICD-10-CM | POA: Diagnosis not present

## 2014-05-23 DIAGNOSIS — R112 Nausea with vomiting, unspecified: Secondary | ICD-10-CM | POA: Insufficient documentation

## 2014-05-23 DIAGNOSIS — I10 Essential (primary) hypertension: Secondary | ICD-10-CM | POA: Diagnosis present

## 2014-05-23 LAB — CBC WITH DIFFERENTIAL/PLATELET
BASOS ABS: 0 10*3/uL (ref 0.0–0.1)
Basophils Relative: 0 % (ref 0–1)
Eosinophils Absolute: 0 10*3/uL (ref 0.0–0.7)
Eosinophils Relative: 1 % (ref 0–5)
HEMATOCRIT: 33.4 % — AB (ref 36.0–46.0)
HEMOGLOBIN: 10.8 g/dL — AB (ref 12.0–15.0)
Lymphocytes Relative: 15 % (ref 12–46)
Lymphs Abs: 0.9 10*3/uL (ref 0.7–4.0)
MCH: 26.1 pg (ref 26.0–34.0)
MCHC: 32.3 g/dL (ref 30.0–36.0)
MCV: 80.7 fL (ref 78.0–100.0)
MONO ABS: 0.6 10*3/uL (ref 0.1–1.0)
MONOS PCT: 10 % (ref 3–12)
Neutro Abs: 4.2 10*3/uL (ref 1.7–7.7)
Neutrophils Relative %: 74 % (ref 43–77)
Platelets: 279 10*3/uL (ref 150–400)
RBC: 4.14 MIL/uL (ref 3.87–5.11)
RDW: 16.8 % — ABNORMAL HIGH (ref 11.5–15.5)
WBC: 5.7 10*3/uL (ref 4.0–10.5)

## 2014-05-23 LAB — COMPREHENSIVE METABOLIC PANEL
ALT: 9 U/L (ref 0–35)
ANION GAP: 12 (ref 5–15)
AST: 18 U/L (ref 0–37)
Albumin: 3 g/dL — ABNORMAL LOW (ref 3.5–5.2)
Alkaline Phosphatase: 58 U/L (ref 39–117)
BILIRUBIN TOTAL: 0.6 mg/dL (ref 0.3–1.2)
BUN: 33 mg/dL — ABNORMAL HIGH (ref 6–23)
CHLORIDE: 95 mmol/L — AB (ref 96–112)
CO2: 28 mmol/L (ref 19–32)
Calcium: 8.9 mg/dL (ref 8.4–10.5)
Creatinine, Ser: 11.4 mg/dL — ABNORMAL HIGH (ref 0.50–1.10)
GFR calc Af Amer: 5 mL/min — ABNORMAL LOW (ref >90)
GFR calc non Af Amer: 4 mL/min — ABNORMAL LOW (ref >90)
Glucose, Bld: 95 mg/dL (ref 70–99)
Potassium: 3.6 mmol/L (ref 3.5–5.1)
SODIUM: 135 mmol/L (ref 135–145)
Total Protein: 7.1 g/dL (ref 6.0–8.3)

## 2014-05-23 LAB — HCG, QUANTITATIVE, PREGNANCY: hCG, Beta Chain, Quant, S: 11 m[IU]/mL — ABNORMAL HIGH (ref <5)

## 2014-05-23 LAB — D-DIMER, QUANTITATIVE (NOT AT ARMC): D-Dimer, Quant: 3.94 ug/mL-FEU — ABNORMAL HIGH (ref 0.00–0.48)

## 2014-05-23 LAB — TROPONIN I: Troponin I: 0.05 ng/mL — ABNORMAL HIGH (ref <0.031)

## 2014-05-23 MED ORDER — ONDANSETRON 4 MG PO TBDP
4.0000 mg | ORAL_TABLET | Freq: Once | ORAL | Status: AC
  Administered 2014-05-23: 4 mg via ORAL
  Filled 2014-05-23: qty 1

## 2014-05-23 MED ORDER — OXYCODONE-ACETAMINOPHEN 5-325 MG PO TABS
2.0000 | ORAL_TABLET | Freq: Once | ORAL | Status: AC
  Administered 2014-05-23: 2 via ORAL
  Filled 2014-05-23: qty 2

## 2014-05-23 MED ORDER — FENTANYL CITRATE 0.05 MG/ML IJ SOLN: 50.0000 ug | Freq: Once | INTRAMUSCULAR | Status: DC

## 2014-05-23 MED ORDER — ONDANSETRON HCL 4 MG/2ML IJ SOLN: 4.0000 mg | Freq: Once | INTRAMUSCULAR | Status: DC

## 2014-05-23 NOTE — ED Notes (Signed)
Pt updated on wait time.  
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
## 2014-05-23 NOTE — ED Notes (Signed)
Presents with nausea, vomiting and diarrhea since LAst Wednesday-reports that today her stools are formed, but continues to throw up everynight. She is a dialysis patient and went to dialysis today, her doctor told her to come here due to multiple complaints of joint pain for three days and back pain and wheezing and cough. She also reports higher than normal BPs for the past few days. Alert, oriented.

## 2014-05-23 NOTE — ED Notes (Signed)
Patient transported to X-ray 

## 2014-05-24 ENCOUNTER — Observation Stay (HOSPITAL_COMMUNITY): Payer: Medicare Other

## 2014-05-24 ENCOUNTER — Encounter (HOSPITAL_COMMUNITY): Payer: Self-pay | Admitting: Nurse Practitioner

## 2014-05-24 DIAGNOSIS — Z992 Dependence on renal dialysis: Secondary | ICD-10-CM

## 2014-05-24 DIAGNOSIS — N186 End stage renal disease: Secondary | ICD-10-CM | POA: Diagnosis present

## 2014-05-24 DIAGNOSIS — R112 Nausea with vomiting, unspecified: Secondary | ICD-10-CM | POA: Insufficient documentation

## 2014-05-24 DIAGNOSIS — F319 Bipolar disorder, unspecified: Secondary | ICD-10-CM | POA: Diagnosis present

## 2014-05-24 DIAGNOSIS — R7989 Other specified abnormal findings of blood chemistry: Secondary | ICD-10-CM

## 2014-05-24 DIAGNOSIS — R791 Abnormal coagulation profile: Secondary | ICD-10-CM | POA: Diagnosis not present

## 2014-05-24 DIAGNOSIS — M329 Systemic lupus erythematosus, unspecified: Secondary | ICD-10-CM | POA: Diagnosis not present

## 2014-05-24 DIAGNOSIS — I120 Hypertensive chronic kidney disease with stage 5 chronic kidney disease or end stage renal disease: Secondary | ICD-10-CM | POA: Diagnosis not present

## 2014-05-24 DIAGNOSIS — R0789 Other chest pain: Secondary | ICD-10-CM | POA: Diagnosis not present

## 2014-05-24 DIAGNOSIS — R079 Chest pain, unspecified: Secondary | ICD-10-CM | POA: Diagnosis not present

## 2014-05-24 DIAGNOSIS — Z3490 Encounter for supervision of normal pregnancy, unspecified, unspecified trimester: Secondary | ICD-10-CM

## 2014-05-24 DIAGNOSIS — I10 Essential (primary) hypertension: Secondary | ICD-10-CM | POA: Diagnosis present

## 2014-05-24 LAB — CLOSTRIDIUM DIFFICILE BY PCR: Toxigenic C. Difficile by PCR: NEGATIVE

## 2014-05-24 LAB — CBC
HCT: 31.2 % — ABNORMAL LOW (ref 36.0–46.0)
Hemoglobin: 10.1 g/dL — ABNORMAL LOW (ref 12.0–15.0)
MCH: 27.2 pg (ref 26.0–34.0)
MCHC: 32.4 g/dL (ref 30.0–36.0)
MCV: 83.9 fL (ref 78.0–100.0)
Platelets: 176 10*3/uL (ref 150–400)
RBC: 3.72 MIL/uL — AB (ref 3.87–5.11)
RDW: 16.9 % — ABNORMAL HIGH (ref 11.5–15.5)
WBC: 5.1 10*3/uL (ref 4.0–10.5)

## 2014-05-24 LAB — TROPONIN I
TROPONIN I: 0.06 ng/mL — AB (ref <0.031)
Troponin I: 0.04 ng/mL — ABNORMAL HIGH (ref <0.031)
Troponin I: 0.05 ng/mL — ABNORMAL HIGH (ref <0.031)

## 2014-05-24 LAB — RAPID URINE DRUG SCREEN, HOSP PERFORMED
Amphetamines: NOT DETECTED
Barbiturates: NOT DETECTED
Benzodiazepines: NOT DETECTED
COCAINE: NOT DETECTED
Opiates: NOT DETECTED
TETRAHYDROCANNABINOL: NOT DETECTED

## 2014-05-24 LAB — BASIC METABOLIC PANEL
Anion gap: 12 (ref 5–15)
BUN: 37 mg/dL — ABNORMAL HIGH (ref 6–23)
CALCIUM: 9 mg/dL (ref 8.4–10.5)
CO2: 27 mmol/L (ref 19–32)
CREATININE: 12.19 mg/dL — AB (ref 0.50–1.10)
Chloride: 94 mmol/L — ABNORMAL LOW (ref 96–112)
GFR calc Af Amer: 4 mL/min — ABNORMAL LOW (ref >90)
GFR calc non Af Amer: 4 mL/min — ABNORMAL LOW (ref >90)
Glucose, Bld: 95 mg/dL (ref 70–99)
Potassium: 3.7 mmol/L (ref 3.5–5.1)
Sodium: 133 mmol/L — ABNORMAL LOW (ref 135–145)

## 2014-05-24 LAB — HEPARIN LEVEL (UNFRACTIONATED)

## 2014-05-24 LAB — HCG, QUANTITATIVE, PREGNANCY: HCG, BETA CHAIN, QUANT, S: 10 m[IU]/mL — AB (ref <5)

## 2014-05-24 MED ORDER — ACETAMINOPHEN 650 MG RE SUPP: 650.0000 mg | Freq: Four times a day (QID) | RECTAL | Status: DC | PRN

## 2014-05-24 MED ORDER — CALCIUM ACETATE 667 MG PO CAPS
667.0000 mg | ORAL_CAPSULE | ORAL | Status: DC | PRN
  Filled 2014-05-24: qty 1

## 2014-05-24 MED ORDER — SODIUM CHLORIDE 0.9 % IV SOLN
INTRAVENOUS | Status: AC
  Administered 2014-05-24: 03:00:00 via INTRAVENOUS

## 2014-05-24 MED ORDER — CALCIUM ACETATE 667 MG PO CAPS
2001.0000 mg | ORAL_CAPSULE | Freq: Three times a day (TID) | ORAL | Status: DC
  Administered 2014-05-24 – 2014-05-25 (×4): 2001 mg via ORAL
  Filled 2014-05-24 (×7): qty 3

## 2014-05-24 MED ORDER — DARBEPOETIN ALFA 40 MCG/0.4ML IJ SOSY
40.0000 ug | PREFILLED_SYRINGE | INTRAMUSCULAR | Status: DC
  Administered 2014-05-25: 40 ug via INTRAVENOUS
  Filled 2014-05-24: qty 0.4

## 2014-05-24 MED ORDER — TECHNETIUM TC 99M DIETHYLENETRIAME-PENTAACETIC ACID: 40.0000 | Freq: Once | INTRAVENOUS | Status: AC | PRN

## 2014-05-24 MED ORDER — ACETAMINOPHEN 325 MG PO TABS
650.0000 mg | ORAL_TABLET | Freq: Four times a day (QID) | ORAL | Status: DC | PRN
  Administered 2014-05-24: 650 mg via ORAL
  Filled 2014-05-24: qty 2

## 2014-05-24 MED ORDER — OXYCODONE HCL 5 MG PO TABS
5.0000 mg | ORAL_TABLET | Freq: Four times a day (QID) | ORAL | Status: DC | PRN
  Administered 2014-05-24 – 2014-05-25 (×3): 5 mg via ORAL
  Filled 2014-05-24 (×2): qty 1

## 2014-05-24 MED ORDER — HEPARIN BOLUS VIA INFUSION
1500.0000 [IU] | Freq: Once | INTRAVENOUS | Status: AC
  Administered 2014-05-24: 1500 [IU] via INTRAVENOUS
  Filled 2014-05-24: qty 1500

## 2014-05-24 MED ORDER — HYDRALAZINE HCL 20 MG/ML IJ SOLN: 2.0000 mg | Freq: Three times a day (TID) | INTRAMUSCULAR | Status: DC | PRN

## 2014-05-24 MED ORDER — HYDROXYZINE HCL 25 MG PO TABS
25.0000 mg | ORAL_TABLET | Freq: Four times a day (QID) | ORAL | Status: DC | PRN
  Filled 2014-05-24: qty 1

## 2014-05-24 MED ORDER — HEPARIN (PORCINE) IN NACL 100-0.45 UNIT/ML-% IJ SOLN
1100.0000 [IU]/h | INTRAMUSCULAR | Status: DC
  Administered 2014-05-24: 950 [IU]/h via INTRAVENOUS
  Filled 2014-05-24 (×2): qty 250

## 2014-05-24 MED ORDER — RENA-VITE PO TABS
1.0000 | ORAL_TABLET | Freq: Every day | ORAL | Status: DC
Start: 2014-05-24 — End: 2014-05-25
  Administered 2014-05-24: 1 via ORAL
  Filled 2014-05-24 (×2): qty 1

## 2014-05-24 MED ORDER — ONDANSETRON HCL 4 MG/2ML IJ SOLN
4.0000 mg | Freq: Four times a day (QID) | INTRAMUSCULAR | Status: DC | PRN
  Administered 2014-05-24: 4 mg via INTRAVENOUS
  Filled 2014-05-24: qty 2

## 2014-05-24 MED ORDER — HEPARIN BOLUS VIA INFUSION
3000.0000 [IU] | Freq: Once | INTRAVENOUS | Status: AC
  Administered 2014-05-24: 3000 [IU] via INTRAVENOUS
  Filled 2014-05-24: qty 3000

## 2014-05-24 MED ORDER — NA FERRIC GLUC CPLX IN SUCROSE 12.5 MG/ML IV SOLN
62.5000 mg | INTRAVENOUS | Status: DC
  Administered 2014-05-25: 62.5 mg via INTRAVENOUS
  Filled 2014-05-24 (×2): qty 5

## 2014-05-24 MED ORDER — ONDANSETRON HCL 4 MG PO TABS
4.0000 mg | ORAL_TABLET | Freq: Four times a day (QID) | ORAL | Status: DC | PRN
  Administered 2014-05-24: 4 mg via ORAL
  Filled 2014-05-24: qty 1

## 2014-05-24 MED ORDER — COLCHICINE 0.6 MG PO TABS
0.6000 mg | ORAL_TABLET | Freq: Once | ORAL | Status: AC
  Administered 2014-05-24: 0.6 mg via ORAL
  Filled 2014-05-24: qty 1

## 2014-05-24 MED ORDER — TECHNETIUM TO 99M ALBUMIN AGGREGATED: 6.0000 | Freq: Once | INTRAVENOUS | Status: AC | PRN

## 2014-05-24 MED ORDER — SODIUM CHLORIDE 0.9 % IJ SOLN
3.0000 mL | Freq: Two times a day (BID) | INTRAMUSCULAR | Status: DC
  Administered 2014-05-24 (×2): 3 mL via INTRAVENOUS

## 2014-05-24 NOTE — Progress Notes (Signed)
Infection control called stating that the pt c-diff test is negative. Cdiff precaution d/c.
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
## 2014-05-24 NOTE — Progress Notes (Signed)
UR completed 

## 2014-05-24 NOTE — Progress Notes (Signed)
ANTICOAGULATION CONSULT NOTE - Follow Up Consult  Pharmacy Consult for heparin Indication: r/o PE  Allergies  Allergen Reactions  . Ativan [Lorazepam] Other (See Comments)    Dysarthria(patient has difficulty speaking and slurred speech); denies swelling, itching, pain, or numbness.  [name] [Ziprasidone Hcl] Itching and Swelling    Tongue swelling  . Keflex [Cephalexin] Swelling    Tongue swelling  . Oatmeal Swelling    Tongue swelling  . Other Itching and Other (See Comments)    Wool causes itching    Patient Measurements: Height: 5\' 7"  (170.2 cm) Weight: 131 lb 3.2 oz (59.512 kg) IBW/kg (Calculated) : 61.6 Heparin Dosing Weight: 59 kg  Vital Signs: Temp: 98.4 F (36.9 C) (02/17 0448) Temp Source: Oral (02/17 0448) BP: 162/94 mmHg (02/17 0459) Pulse Rate: 112 (02/17 0448)  Labs:  Recent Labs  05/23/14 1609 05/23/14 2006 05/24/14 0319 05/24/14 0850  HGB 10.8*  --  10.1*  --   HCT 33.4*  --  31.2*  --   PLT 279  --  176  --   HEPARINUNFRC  --   --   --  <0.10*  CREATININE 11.40*  --  12.19*  --   TROPONINI  --  0.05* 0.06* 0.04*    Estimated Creatinine Clearance: 6.2 mL/min (by C-G formula based on Cr of 12.19).  Assessment: Patient is a 34 y.o F on heparin for r/o PE with plan for VQ scan today.  Heparin level is undetectable.  Per RN, no issues with IV line and no bleeding noted.  Goal of Therapy:  Heparin level 0.3-0.7 units/ml Monitor platelets by anticoagulation protocol: Yes   Plan:  - heparin 1500 units IV bolus x1, then increase drip to 1100 units/hr - check 8 hour heparin level - f/u VQ scan results  Kadir Azucena P 05/24/2014,10:42 AM
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
## 2014-05-24 NOTE — Progress Notes (Signed)
Family Medicine Teaching Service Daily Progress Note Intern Pager: [phone]  Patient name: Sara [name] Medical record number: [id number] Date of birth: 22-Mar-1981 Age: 34 y.o. Gender: female  Primary Care Provider: Tommi Rumps, MD Consultants: cardiology Code Status: Full (discussed on admission)  Pt Overview and Major Events to Date:   Assessment and Plan: [name] is a 34 y.o. female presenting with nausea / vomiting, diarrhea for 1 week, now with new onset pleuritic chest pain, found to have elevated D-dimer to 3.84 . 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  # Atypical chest pain (improved), in setting of elevated D-dimer and mild elevated Troponin - On admit with tachycardia and pleuritic chest pain, suspected clinically mildly dehydrated given recent GI losses with vomiting / diarrhea. Clinically less likely PE but cannot rule out PE (Wells 1.5 vs 4 if PE considered most likely dx), Elevated D-dimer 3.94, however unable to perform chest CTA d/t ESRD. Chest pain considered atypical, pleuritic, positional, and intermittent x 24 hours, unlikely ACS, however work-up in ED with initial troponin elevated 0.05. No EKG obtained in ED as pt was admitted prior to obtaining, EKG on floor with sinus, PVCs, some early repolarization V2-V3, and V5 T-wave inversion without concerns for acute ischemic changes. Otherwise on evaluation pt vital stable, CP resolved, tachycardia improved, no hypoxia. CXR with cardiomegaly. Trop 0.05>0.06 - telemetry monitoring, cont pulse ox, O2 PRN - empiric therapeutic Heparin drip per pharm consult - Ordered VQ Scan (unable to be performed until this AM); also spoke to radiologist about this in the setting of possible pregnancy.  VQ ok to perform. - Troponin cycle x 3 (suspect mild elevation due to ESRD) - Repeat EKG this morning relatively unchanged with sinus tachycardia and ?LVH -  Tylenol PRN, Percocet PRN breakthrough - c/s to cards, appreciate recs  # Nausea / Vomiting / Diarrhea - Improved - Suspected viral gastro. Mild clinical dehydration initially with tachycardia. Improved s/p IVF and Zofran ODT x 1 dose. 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 No home anti-HTNs currently. BP this am 159/108 - Monitor.  If not resolving will consider adding Norvasc - Hydralazine IV SBP>180 or DBP>120  # Psychiatric: H/o schizophrenia and bipolar disorder: Stable mood. - Chart review on Haldol 5mg  BID, however patient denies taking this currently  # Elevated betaHCG, questionable mild elevated, suspect false positive:  Denies any sexual intercourse for past 6 months. No active contraception - Beta hcg quant 11, (ref range negative non-pregnant < 5) - Repeat beta hcg this am 10 - transvaginal u/s ordered  # h/o substance abuse: UDS 06/2013 +cocaine -UDS ordered  FEN/GI: NS @ 100 cc/hr x 6 hours overnight / regular diet Prophylaxis: therapeutic Heparin gtt per pharm (rule out PE)  Disposition: Discharge pending evaluation for CP/PE  Subjective:  Patient reports that she is doing ok this morning.  She denies any more vomiting or nausea, just states that she is not hungry right now and will eat her breakfast later.  She endorses continued L sided chest pain,  worsened by deep breathing.  Denies SOB or pain elsewhere.  Objective: Temp:  [98.3 F (36.8 C)-98.8 F (37.1 C)] 98.4 F (36.9 C) (02/17 0448) Pulse Rate:  [90-124] 112 (02/17 0448) Resp:  [13-22] 20 (02/17 0448) BP: (132-162)/(94-112) 162/94 mmHg (02/17  0459) SpO2:  [96 %-100 %] 96 % (02/17 0448) Weight:  [131 lb 3.2 oz (59.512 kg)-148 lb (67.132 kg)] 131 lb 3.2 oz (59.512 kg) (02/17 0102) Physical Exam: General: lying in bed, comfortable, NAD HEENT: NCAT, EOMI Cardiovascular: tachycardic but regular rhythm, S1S2, flow murmur present Chest Wall: No TTP Respiratory: mildly decreased BS on L compared to R, no wheezing, crackles, or rhonchi. No increased WOB Abdomen: soft, NTND, +active BS Extremities: non-tender calves, no edema, symmetrical, no palpable cords LE, moves all ext Skin: warm, dry, multiple nodular raised lesions on left lateral forearm near elbow without evidence of active infection (no draining pus, no erythema, non-tender, no warmth) Neuro: no focal deficits, follows commands, speech normal  Laboratory:  Recent Labs Lab 05/23/14 1609 05/24/14 0319  WBC 5.7 5.1  HGB 10.8* 10.1*  HCT 33.4* 31.2*  PLT 279 176    Recent Labs Lab 05/23/14 1609 05/24/14 0319  NA 135 133*  K 3.6 3.7  CL 95* 94*  CO2 28 27  BUN 33* 37*  CREATININE 11.40* 12.19*  CALCIUM 8.9 9.0  PROT 7.1  --   BILITOT 0.6  --   ALKPHOS 58  --   ALT 9  --   AST 18  --   GLUCOSE 95 95    Cardiac Panel (last 3 results)  Recent Labs  05/23/14 2006 05/24/14 0319  TROPONINI 0.05* 0.06*    Imaging/Diagnostic Tests: Dg Chest 2 View  05/23/2014   CLINICAL DATA:  Chest pain for 3 days.  EXAM: CHEST  2 VIEW  COMPARISON:  05/06/2014  FINDINGS: There is marked cardiomegaly, unchanged. Minimal blunting of the left lateral costophrenic angle may represent a tiny effusion. The right lung is clear. Pulmonary vasculature is normal.  IMPRESSION: Cardiomegaly.  Question tiny left pleural effusion.   Electronically Signed   By: Andreas Newport M.D.   On: 05/23/2014 21:04    Janora Norlander, DO 05/24/2014, 9:32 AM PGY-1, [facility] Intern pager: [phone], text pages welcome

## 2014-05-24 NOTE — Progress Notes (Signed)
ANTICOAGULATION CONSULT NOTE - Initial Consult  Pharmacy Consult for Heparin  Indication: pulmonary embolus, RULE OUT  Allergies  Allergen Reactions  . Ativan [Lorazepam] Other (See Comments)    Dysarthria(patient has difficulty speaking and slurred speech); denies swelling, itching, pain, or numbness.  [name] [Ziprasidone Hcl] Itching and Swelling    Tongue swelling  . Keflex [Cephalexin] Swelling    Tongue swelling  . Oatmeal Swelling    Tongue swelling  . Other Itching and Other (See Comments)    Wool causes itching    Patient Measurements: Height: 5\' 7"  (170.2 cm) Weight: 148 lb (67.132 kg) IBW/kg (Calculated) : 61.6  Vital Signs: Temp: 98.8 F (37.1 C) (02/16 1558) Temp Source: Oral (02/16 1558) BP: 132/98 mmHg (02/16 2245) Pulse Rate: 92 (02/16 2245)  Labs:  Recent Labs  05/23/14 1609 05/23/14 2006  HGB 10.8*  --   HCT 33.4*  --   PLT 279  --   CREATININE 11.40*  --   TROPONINI  --  0.05*    Estimated Creatinine Clearance: 6.9 mL/min (by C-G formula based on Cr of 11.4).   Medical History: Past Medical History  Diagnosis Date  . Psychosis   . Hypertension   . Lupus   . Lupus nephritis   . Bipolar 1 disorder   . Schizophrenia   . Pregnancy     Assessment: 34 y/o F with chest pain/back pain for several days, found to have elevated D-Dimer, MD ordering VQ scan, to start heparin per pharmacy.   Goal of Therapy:  Heparin level 0.3-0.7 units/ml Monitor platelets by anticoagulation protocol: Yes   Plan:  -Heparin 3000 units BOLUS -Start heparin drip at 950 units/hr -0900 HL -Daily CBC/HL -Monitor for bleeding -F/U PE work-up  Narda Bonds 05/24/2014,12:03 AM

## 2014-05-25 ENCOUNTER — Other Ambulatory Visit: Payer: Self-pay | Admitting: Physician Assistant

## 2014-05-25 DIAGNOSIS — I309 Acute pericarditis, unspecified: Secondary | ICD-10-CM | POA: Diagnosis not present

## 2014-05-25 DIAGNOSIS — N186 End stage renal disease: Secondary | ICD-10-CM

## 2014-05-25 DIAGNOSIS — R079 Chest pain, unspecified: Secondary | ICD-10-CM | POA: Insufficient documentation

## 2014-05-25 DIAGNOSIS — Z992 Dependence on renal dialysis: Secondary | ICD-10-CM | POA: Diagnosis not present

## 2014-05-25 DIAGNOSIS — I10 Essential (primary) hypertension: Secondary | ICD-10-CM

## 2014-05-25 DIAGNOSIS — R0789 Other chest pain: Secondary | ICD-10-CM | POA: Diagnosis not present

## 2014-05-25 DIAGNOSIS — R072 Precordial pain: Secondary | ICD-10-CM

## 2014-05-25 DIAGNOSIS — M329 Systemic lupus erythematosus, unspecified: Secondary | ICD-10-CM

## 2014-05-25 LAB — RENAL FUNCTION PANEL
ALBUMIN: 2.6 g/dL — AB (ref 3.5–5.2)
ANION GAP: 15 (ref 5–15)
Albumin: 2.7 g/dL — ABNORMAL LOW (ref 3.5–5.2)
Anion gap: 17 — ABNORMAL HIGH (ref 5–15)
BUN: 47 mg/dL — AB (ref 6–23)
BUN: 47 mg/dL — AB (ref 6–23)
CO2: 23 mmol/L (ref 19–32)
CO2: 24 mmol/L (ref 19–32)
Calcium: 9 mg/dL (ref 8.4–10.5)
Calcium: 9 mg/dL (ref 8.4–10.5)
Chloride: 97 mmol/L (ref 96–112)
Chloride: 93 mmol/L — ABNORMAL LOW (ref 96–112)
Creatinine, Ser: 14.19 mg/dL — ABNORMAL HIGH (ref 0.50–1.10)
Creatinine, Ser: 14.98 mg/dL — ABNORMAL HIGH (ref 0.50–1.10)
GFR calc Af Amer: 3 mL/min — ABNORMAL LOW (ref >90)
GFR calc non Af Amer: 3 mL/min — ABNORMAL LOW (ref >90)
GFR calc non Af Amer: 3 mL/min — ABNORMAL LOW (ref >90)
GFR, EST AFRICAN AMERICAN: 3 mL/min — AB (ref >90)
GLUCOSE: 75 mg/dL (ref 70–99)
GLUCOSE: 74 mg/dL (ref 70–99)
PHOSPHORUS: 9 mg/dL — AB (ref 2.3–4.6)
POTASSIUM: 4.2 mmol/L (ref 3.5–5.1)
Phosphorus: 8.7 mg/dL — ABNORMAL HIGH (ref 2.3–4.6)
Potassium: 4.2 mmol/L (ref 3.5–5.1)
Sodium: 135 mmol/L (ref 135–145)
Sodium: 134 mmol/L — ABNORMAL LOW (ref 135–145)

## 2014-05-25 LAB — CBC
HCT: 29.9 % — ABNORMAL LOW (ref 36.0–46.0)
HCT: 29.4 % — ABNORMAL LOW (ref 36.0–46.0)
HEMOGLOBIN: 9.4 g/dL — AB (ref 12.0–15.0)
Hemoglobin: 9.8 g/dL — ABNORMAL LOW (ref 12.0–15.0)
MCH: 26.8 pg (ref 26.0–34.0)
MCH: 26 pg (ref 26.0–34.0)
MCHC: 32.8 g/dL (ref 30.0–36.0)
MCHC: 32 g/dL (ref 30.0–36.0)
MCV: 81.7 fL (ref 78.0–100.0)
MCV: 81.2 fL (ref 78.0–100.0)
PLATELETS: 170 10*3/uL (ref 150–400)
Platelets: 177 10*3/uL (ref 150–400)
RBC: 3.66 MIL/uL — ABNORMAL LOW (ref 3.87–5.11)
RBC: 3.62 MIL/uL — ABNORMAL LOW (ref 3.87–5.11)
RDW: 16.9 % — AB (ref 11.5–15.5)
RDW: 17 % — ABNORMAL HIGH (ref 11.5–15.5)
WBC: 5.9 10*3/uL (ref 4.0–10.5)
WBC: 6.1 10*3/uL (ref 4.0–10.5)

## 2014-05-25 MED ORDER — PREDNISONE 20 MG PO TABS: 40.0000 mg | ORAL_TABLET | Freq: Every day | ORAL | Status: DC

## 2014-05-25 MED ORDER — COLCHICINE 0.6 MG PO TABS: 0.6000 mg | ORAL_TABLET | ORAL | Status: DC

## 2014-05-25 MED ORDER — OXYCODONE HCL 5 MG PO TABS
ORAL_TABLET | ORAL | Status: AC
  Filled 2014-05-25: qty 1

## 2014-05-25 MED ORDER — COLCHICINE 0.6 MG PO TABS: 0.6000 mg | ORAL_TABLET | Freq: Two times a day (BID) | ORAL | Status: DC

## 2014-05-25 MED ORDER — DARBEPOETIN ALFA 40 MCG/0.4ML IJ SOSY
PREFILLED_SYRINGE | INTRAMUSCULAR | Status: AC
  Filled 2014-05-25: qty 0.4

## 2014-05-25 MED ORDER — METOPROLOL TARTRATE 25 MG PO TABS
25.0000 mg | ORAL_TABLET | Freq: Two times a day (BID) | ORAL | Status: DC
  Filled 2014-05-25 (×2): qty 1

## 2014-05-25 MED ORDER — IBUPROFEN 200 MG PO TABS: 200.0000 mg | ORAL_TABLET | Freq: Three times a day (TID) | ORAL | Status: DC

## 2014-05-25 MED ORDER — HYDRALAZINE HCL 25 MG PO TABS: 25.0000 mg | ORAL_TABLET | Freq: Three times a day (TID) | ORAL | Status: DC

## 2014-05-25 MED ORDER — ISOSORBIDE MONONITRATE ER 30 MG PO TB24: 30.0000 mg | ORAL_TABLET | Freq: Every day | ORAL | Status: DC

## 2014-05-25 MED ORDER — METOPROLOL TARTRATE 25 MG PO TABS: 25.0000 mg | ORAL_TABLET | Freq: Two times a day (BID) | ORAL | Status: DC

## 2014-05-25 MED ORDER — COLCHICINE 0.6 MG PO TABS
0.6000 mg | ORAL_TABLET | Freq: Every day | ORAL | Status: DC
  Filled 2014-05-25: qty 1

## 2014-05-25 NOTE — Progress Notes (Signed)
Per Dr. Meda Coffee, echo positive for perimyocarditis, further medication change per Dr. Meda Coffee. Per Dr. Meda Coffee, patient can be discharged by [facility] Medicine team today. I will arrange outpatient cardiology followup up early next week. I have asked nursing staff to inform primary team  Thank you for the consult  Signed, Almyra Deforest PA Pager: [phone]

## 2014-05-25 NOTE — Discharge Instructions (Signed)

## 2014-05-25 NOTE — Discharge Summary (Signed)
[facility] Discharge Summary  Patient name: Sara [name] Medical record number: [id number] Date of birth: [birth year] Age: 34 y.o. Gender: female Date of Admission: 05/23/2014  Date of Discharge: 05/25/14 Admitting Physician: Andrena Mews, MD  Primary Care Provider: Tommi Rumps, MD Consultants: Cardiology  Indication for Hospitalization: chest pain, nausea/vomiting  Discharge Diagnoses/Problem List:  Chest pain Positive D Dimer SLE ESRD on dialysis Nausea/Vomiting HTN Bipolar 1 disorder Elevated Beta Hcg  Disposition: Discharge home  Discharge Condition: Stable  Discharge Exam: please see progress note 05/25/14 by Dr Alease Frame  Brief [facility] Course:  Sara [name] is a 34 y.o. female that presented with nausea / vomiting, diarrhea for 1 week, a new onset pleuritic chest pain, and found to have elevated D-dimer to 3.84 . 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  Patient was admitted to the [facility] for further management.  EKG was sinus, PVCs, some early repolarization V2-V3, and V5 T-wave inversion without concerns for acute ischemic changes.  Troponin was elevated to 0.05. CXR showed cardiomegaly. D dimer 3.94. Hcg elevated 11.  Patient was empirically treated with a heparin drip in the setting of elevated troponins and chest pain.  In the setting of ESRD and a possible pregnancy, v/q scan was ordered.  V/Q scan did not reveal a PE.  Troponins continued to show mild elevation and for this reason, cardiology was consulted.  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  Cardiology felt that the echo read did not reflect patients condition and were not concerned with any hemodynamic compromise from the pericarditis. Patient was initiated on Colchicine, Ibuprofen and prednisone.  In addition because her EF was found to be 30-35%, she was started on Imdur and  Hydralazine.  Patient was instructed to follow up with cardiology as scheduled.  Hcg was repeated, as patient denied any sexual activity within the preceding 6 months.  It was again elevated to 10.  An transvaginal ultrasound was obtained which revealed no IUP.  Patient was continued on HD as scheduled while hospitalized.  She was also continued on her home medications for ESRD.     Patient was discharged in stable condition with close follow up with PCP and cardiology.  Issues for Follow Up:  1. Repeat beta Hcg 1 week after discharge. Consider referral to Gyn if still elevated. 2. Follow up outpatient cardiology recommendations   Significant Procedures: none  Significant Labs and Imaging:   Recent Labs Lab 05/24/14 0319 05/25/14 0637 05/25/14 1326  WBC 5.1 5.9 6.1  HGB 10.1* 9.8* 9.4*  HCT 31.2* 29.9* 29.4*  PLT 176 170 177    Recent Labs Lab 05/23/14 1609 05/24/14 0319 05/25/14 0637 05/25/14 1326  NA 135 133* 135 134*  K 3.6 3.7 4.2 4.2  CL 95* 94* 97 93*  CO2 28 27 23 24   GLUCOSE 95 95 75 74  BUN 33* 37* 47* 47*  CREATININE 11.40* 12.19* 14.19* 14.98*  CALCIUM 8.9 9.0 9.0 9.0  PHOS  --   --  9.0* 8.7*  ALKPHOS 58  --   --   --   AST 18  --   --   --   ALT 9  --   --   --   ALBUMIN 3.0*  --  2.6* 2.7*   Cardiac Panel (last 3 results)  Recent Labs  05/24/14 0319 05/24/14 0850 05/24/14 1435  TROPONINI 0.06* 0.04* 0.05*  2D echo:  Study Conclusions - Left ventricle: The cavity size was mildly dilated. There was moderate concentric hypertrophy. Systolic function was moderately to severely reduced. The estimated ejection fraction was in the range of 30% to 35%. Wall motion was normal; there were no regional wall motion abnormalities. - Aortic valve: Trileaflet; normal thickness, mildly calcified leaflets. - Mitral valve: There was mild regurgitation. - Left atrium: The atrium was severely dilated. - Pulmonary arteries: PA peak pressure: 43  mm Hg (S). - Pericardium, extracardiac: There are findings present that could indicate early tamponade. A small to moderate, free-flowing pericardial effusion was identified circumferential to the heart. The fluid had no internal echoes. There was chamber collapse. There was moderateright atrial chamber collapse for more than 50% of the cardiac cycle. There was mildright ventricular chamber collapse for less than 50% of the cardiac cycle. There was evidence for mildly increased RV-LV interaction demonstrated by respirophasic changes in transmitral velocities. There was a left pleural effusion.  Dg Chest 2 View  05/23/2014   CLINICAL DATA:  Chest pain for 3 days.  EXAM: CHEST  2 VIEW  COMPARISON:  05/06/2014  FINDINGS: There is marked cardiomegaly, unchanged. Minimal blunting of the left lateral costophrenic angle may represent a tiny effusion. The right lung is clear. Pulmonary vasculature is normal.  IMPRESSION: Cardiomegaly.  Question tiny left pleural effusion.   Electronically Signed   By: Andreas Newport M.D.   On: 05/23/2014 21:04   US Ob Comp Less 14 Wks  05/24/2014   CLINICAL DATA:  Assessment for potential intrauterine gestation  EXAM: OBSTETRIC <14 WK [place] AND TRANSVAGINAL OB US  TECHNIQUE: Both transabdominal and transvaginal ultrasound examinations were performed for complete evaluation of the gestation as well as the maternal uterus, adnexal regions, and pelvic cul-de-sac. Transvaginal technique was performed to assess early pregnancy.  COMPARISON:  None.  FINDINGS: Intrauterine gestational sac: Not visualized  Yolk sac:  Not visualized  Embryo:  Not visualized  Maternal uterus/adnexae: Uterus measures 3.6 x 1.6 x 2.7 cm. There is no intrauterine mass or gestation. The endometrium measures 4 mm in thickness with a smooth contour. Right ovary measures 2.2 x 0.8 x 1.5 cm. There is no right-sided adnexal or ovarian mass. Left ovary could not be visualized by either  transabdominal or transvaginal technique. No left-sided pelvic mass seen. There is a small amount of free fluid in the cul-de-sac.  IMPRESSION: No intrauterine mass or intrauterine gestation. The patient has reportedly only a minimally positive beta HCG value. Question spurious lab result versus recent spontaneous abortion. Other differential considerations must include intrauterine gestation too early to be seen by either transabdominal or transvaginal technique or possible ectopic gestation. Note that the left ovary could not be visualized. A small amount of free pelvic fluid is noted. There is no evidence of mass or extrauterine gestation on this study.  Timing of repeat ultrasound will in significant part depend on serial beta HCG examinations.   Electronically Signed   By: Lowella Grip III M.D.   On: 05/24/2014 10:13   US Ob Transvaginal  05/24/2014   CLINICAL DATA:  Assessment for potential intrauterine gestation  EXAM: OBSTETRIC <14 WK [place] AND TRANSVAGINAL OB US  TECHNIQUE: Both transabdominal and transvaginal ultrasound examinations were performed for complete evaluation of the gestation as well as the maternal uterus, adnexal regions, and pelvic cul-de-sac. Transvaginal technique was performed to assess early pregnancy.  COMPARISON:  None.  FINDINGS: Intrauterine gestational sac: Not visualized  Yolk sac:  Not  visualized  Embryo:  Not visualized  Maternal uterus/adnexae: Uterus measures 3.6 x 1.6 x 2.7 cm. There is no intrauterine mass or gestation. The endometrium measures 4 mm in thickness with a smooth contour. Right ovary measures 2.2 x 0.8 x 1.5 cm. There is no right-sided adnexal or ovarian mass. Left ovary could not be visualized by either transabdominal or transvaginal technique. No left-sided pelvic mass seen. There is a small amount of free fluid in the cul-de-sac.  IMPRESSION: No intrauterine mass or intrauterine gestation. The patient has reportedly only a minimally positive beta HCG  value. Question spurious lab result versus recent spontaneous abortion. Other differential considerations must include intrauterine gestation too early to be seen by either transabdominal or transvaginal technique or possible ectopic gestation. Note that the left ovary could not be visualized. A small amount of free pelvic fluid is noted. There is no evidence of mass or extrauterine gestation on this study.  Timing of repeat ultrasound will in significant part depend on serial beta HCG examinations.   Electronically Signed   By: Lowella Grip III M.D.   On: 05/24/2014 10:13   Nm Pulmonary Perf And Vent  05/24/2014   CLINICAL DATA:  Chest pain.  EXAM: NUCLEAR MEDICINE VENTILATION - PERFUSION LUNG SCAN  TECHNIQUE: Ventilation images were obtained in multiple projections using inhaled aerosol technetium 99 M DTPA. Perfusion images were obtained in multiple projections after intravenous injection of Tc-60m MAA.  RADIOPHARMACEUTICALS:  40 mCi Tc-59m DTPA aerosol and 6 mCi Tc-72m MAA  COMPARISON:  Chest x-ray dated 05/23/2014  FINDINGS: Ventilation: No focal ventilation defect.  Perfusion: No wedge shaped peripheral perfusion defects to suggest acute pulmonary embolism.  IMPRESSION: Normal ventilation perfusion lung scan. No evidence of pulmonary embolism.   Electronically Signed   By: Lorriane Shire M.D.   On: 05/24/2014 14:05    Results/Tests Pending at Time of Discharge: none  Discharge Medications:    Medication List    STOP taking these medications        clindamycin 300 MG capsule  Commonly known as:  CLEOCIN     doxycycline 100 MG capsule  Commonly known as:  VIBRAMYCIN     metroNIDAZOLE 500 MG tablet  Commonly known as:  FLAGYL      TAKE these medications        acetaminophen 500 MG tablet  Commonly known as:  TYLENOL  Take 1,000 mg by mouth every 6 (six) hours as needed for moderate pain.     calcium acetate 667 MG capsule  Commonly known as:  PHOSLO  Take 667-2,001 mg by mouth  4 (four) times daily. Take 2001 mg with breakfast, lunch and dinner, and 667 mg with a snack.     colchicine 0.6 MG tablet  Take 1 tablet (0.6 mg total) by mouth every other day.     diphenhydrAMINE 25 MG tablet  Commonly known as:  BENADRYL  Take 25 mg by mouth every 6 (six) hours as needed for itching or allergies.     haloperidol 5 MG tablet  Commonly known as:  HALDOL  TAKE 1 TABLET BY MOUTH TWICE A DAY     hydrALAZINE 25 MG tablet  Commonly known as:  APRESOLINE  Take 1 tablet (25 mg total) by mouth 3 (three) times daily.     hydrOXYzine 25 MG tablet  Commonly known as:  ATARAX/VISTARIL  Take 1 tablet (25 mg total) by mouth every 6 (six) hours.     ibuprofen 200 MG tablet  Commonly known as:  ADVIL  Take 1 tablet (200 mg total) by mouth 3 (three) times daily.     isosorbide mononitrate 30 MG 24 hr tablet  Commonly known as:  IMDUR  Take 1 tablet (30 mg total) by mouth daily.     metoprolol tartrate 25 MG tablet  Commonly known as:  LOPRESSOR  Take 1 tablet (25 mg total) by mouth 2 (two) times daily.     predniSONE 20 MG tablet  Commonly known as:  DELTASONE  Take 2 tablets (40 mg total) by mouth daily with breakfast.     triamcinolone cream 0.1 %  Commonly known as:  KENALOG  Apply 1 application topically 2 (two) times daily.        Discharge Instructions: Please refer to Patient Instructions section of EMR for full details.  Patient was counseled important signs and symptoms that should prompt return to medical care, changes in medications, dietary instructions, activity restrictions, and follow up appointments.   Follow-Up Appointments:     Follow-up Information    Follow up with McKeag, Marylynn Pearson, MD. Go on 05/29/2014.   Specialty:  Family Medicine   Why:  3:30pm (hospital follow up)   Contact information:   [address] [place] [address] [phone]       Follow up with Dorothy Spark, MD.   Specialty:  Cardiology   Why:  Cardiology  office will contact you for early followup next week with repeat echocardiogram. Please give [place] a call if you do not hear from [place] in 2 business days   Contact information:   [address] [place]  [address] [place], DO 05/25/2014, 11:48 PM PGY-1, [place]

## 2014-05-25 NOTE — Progress Notes (Signed)
Family Medicine Teaching Service Daily Progress Note Intern Pager: [phone]  Patient name: Sara [name] Medical record number: [id number] Date of birth: 14-Jun-1980 Age: 34 y.o. Gender: female  Primary Care Provider: Tommi Rumps, MD Consultants: cardiology Code Status: Full (discussed on admission)  Pt Overview and Major Events to Date:   Assessment and Plan: Sara [name] is a 34 y.o. female presenting with nausea / vomiting, diarrhea for 1 week, now with new onset pleuritic chest pain, found to have elevated D-dimer to 3.84 . 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  # Atypical chest pain (improved), in setting of elevated D-dimer and mild elevated Troponin - On admit with tachycardia and pleuritic chest pain, suspected clinically mildly dehydrated given recent GI losses with vomiting / diarrhea. Clinically less likely PE but cannot rule out PE (Wells 1.5 vs 4 if PE considered most likely dx), Elevated D-dimer 3.94, however unable to perform chest CTA d/t ESRD. Chest pain considered atypical, pleuritic, positional, and intermittent x 24 hours, unlikely ACS, however work-up in ED with initial troponin elevated 0.05. No EKG obtained in ED as pt was admitted prior to obtaining, EKG on floor with sinus, PVCs, some early repolarization V2-V3, and V5 T-wave inversion without concerns for acute ischemic changes. Otherwise on evaluation pt vital stable, CP resolved, tachycardia improved, no hypoxia. CXR with cardiomegaly. Trop 0.05>0.06 - telemetry monitoring, cont pulse ox, O2 PRN - empiric therapeutic Heparin drip per pharm consult - Ordered VQ Scan (unable to be performed until this AM); benign - Troponin cycle x 3 (suspect mild elevation due to ESRD) - Tylenol PRN, Percocet PRN breakthrough - c/s to cards, appreciate recs; Echo today.  - start metoprolol 25mg  bid  # Nausea / Vomiting / Diarrhea - Improved -  Suspected viral gastro. Mild clinical dehydration initially with tachycardia. Improved s/p IVF and Zofran ODT x 1 dose. 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 No home anti-HTNs currently. BP this am 159/108 - Monitor.  If not resolving will consider adding Norvasc - Hydralazine IV SBP>180 or DBP>120  # Psychiatric: H/o schizophrenia and bipolar disorder: Stable mood. - Chart review on Haldol 5mg  BID, however patient denies taking this currently  # Elevated betaHCG, questionable mild elevated, suspect false positive:  Denies any sexual intercourse for past 6 months. No active contraception - Beta hcg quant 11, (ref range negative non-pregnant < 5) - Repeat beta hcg this am 10 - transvaginal u/s ordered - no IUP  # h/o substance abuse: UDS 06/2013 +cocaine -UDS ordered  FEN/GI: NS @ 100 cc/hr x 6 hours overnight / regular diet Prophylaxis: therapeutic Heparin gtt per pharm (rule out PE)  Disposition: Discharge pending evaluation for CP/PE -- likely today.  Subjective:  Patient states she is ready to go home. Asking about results from labs we obtained. I was able to go through these with her. No new issues today.  Objective: Temp:  [97.6 F (36.4 C)-98.3 F (36.8 C)] 98.3 F (36.8 C) (02/18 1145) Pulse Rate:  [99-113] 109 (02/18 1300) Resp:  [  18-24] 18 (02/18 1300) BP: (138-172)/(91-111) 172/111 mmHg (02/18 1300) SpO2:  [96 %-98 %] 98 % (02/18 1300) Weight:  [133 lb 9.6 oz (60.6 kg)] 133 lb 9.6 oz (60.6 kg) (02/18 1145) Physical Exam: General: lying in bed, comfortable, NAD HEENT: NCAT, EOMI Cardiovascular: tachycardic  but regular rhythm, S1S2, flow murmur present Chest Wall: No TTP Respiratory: mildly decreased BS on L compared to R, no wheezing, crackles, or rhonchi. No increased WOB Abdomen: soft, NTND, +active BS Extremities: non-tender calves, no edema, symmetrical, no palpable cords LE, moves all ext Skin: warm, dry, multiple nodular raised lesions on left lateral forearm near elbow without evidence of active infection (no draining pus, no erythema, non-tender, no warmth) Neuro: no focal deficits, follows commands, speech normal  Laboratory:  Recent Labs Lab 05/23/14 1609 05/24/14 0319 05/25/14 0637  WBC 5.7 5.1 5.9  HGB 10.8* 10.1* 9.8*  HCT 33.4* 31.2* 29.9*  PLT 279 176 170    Recent Labs Lab 05/23/14 1609 05/24/14 0319 05/25/14 0637  NA 135 133* 135  K 3.6 3.7 4.2  CL 95* 94* 97  CO2 28 27 23   BUN 33* 37* 47*  CREATININE 11.40* 12.19* 14.19*  CALCIUM 8.9 9.0 9.0  PROT 7.1  --   --   BILITOT 0.6  --   --   ALKPHOS 58  --   --   ALT 9  --   --   AST 18  --   --   GLUCOSE 95 95 75    Cardiac Panel (last 3 results)  Recent Labs  05/24/14 0319 05/24/14 0850 05/24/14 1435  TROPONINI 0.06* 0.04* 0.05*    Imaging/Diagnostic Tests: Dg Chest 2 View  05/23/2014   CLINICAL DATA:  Chest pain for 3 days.  EXAM: CHEST  2 VIEW  COMPARISON:  05/06/2014  FINDINGS: There is marked cardiomegaly, unchanged. Minimal blunting of the left lateral costophrenic angle may represent a tiny effusion. The right lung is clear. Pulmonary vasculature is normal.  IMPRESSION: Cardiomegaly.  Question tiny left pleural effusion.   Electronically Signed   By: Andreas Newport M.D.   On: 05/23/2014 21:04   US Ob Comp Less 14 Wks  05/24/2014   CLINICAL DATA:  Assessment for potential intrauterine gestation  EXAM: OBSTETRIC <14 WK [place] AND TRANSVAGINAL OB US  TECHNIQUE: Both transabdominal and transvaginal ultrasound examinations were performed for complete evaluation of the gestation as well as the  maternal uterus, adnexal regions, and pelvic cul-de-sac. Transvaginal technique was performed to assess early pregnancy.  COMPARISON:  None.  FINDINGS: Intrauterine gestational sac: Not visualized  Yolk sac:  Not visualized  Embryo:  Not visualized  Maternal uterus/adnexae: Uterus measures 3.6 x 1.6 x 2.7 cm. There is no intrauterine mass or gestation. The endometrium measures 4 mm in thickness with a smooth contour. Right ovary measures 2.2 x 0.8 x 1.5 cm. There is no right-sided adnexal or ovarian mass. Left ovary could not be visualized by either transabdominal or transvaginal technique. No left-sided pelvic mass seen. There is a small amount of free fluid in the cul-de-sac.  IMPRESSION: No intrauterine mass or intrauterine gestation. The patient has reportedly only a minimally positive beta HCG value. Question spurious lab result versus recent spontaneous abortion. Other differential considerations must include intrauterine gestation too early to be seen by either transabdominal or transvaginal technique or possible ectopic gestation. Note that the left ovary could not be visualized. A small amount of free pelvic fluid is noted. There is no evidence of  mass or extrauterine gestation on this study.  Timing of repeat ultrasound will in significant part depend on serial beta HCG examinations.   Electronically Signed   By: Lowella Grip III M.D.   On: 05/24/2014 10:13   US Ob Transvaginal  05/24/2014   CLINICAL DATA:  Assessment for potential intrauterine gestation  EXAM: OBSTETRIC <14 WK [place] AND TRANSVAGINAL OB US  TECHNIQUE: Both transabdominal and transvaginal ultrasound examinations were performed for complete evaluation of the gestation as well as the maternal uterus, adnexal regions, and pelvic cul-de-sac. Transvaginal technique was performed to assess early pregnancy.  COMPARISON:  None.  FINDINGS: Intrauterine gestational sac: Not visualized  Yolk sac:  Not visualized  Embryo:  Not visualized  Maternal  uterus/adnexae: Uterus measures 3.6 x 1.6 x 2.7 cm. There is no intrauterine mass or gestation. The endometrium measures 4 mm in thickness with a smooth contour. Right ovary measures 2.2 x 0.8 x 1.5 cm. There is no right-sided adnexal or ovarian mass. Left ovary could not be visualized by either transabdominal or transvaginal technique. No left-sided pelvic mass seen. There is a small amount of free fluid in the cul-de-sac.  IMPRESSION: No intrauterine mass or intrauterine gestation. The patient has reportedly only a minimally positive beta HCG value. Question spurious lab result versus recent spontaneous abortion. Other differential considerations must include intrauterine gestation too early to be seen by either transabdominal or transvaginal technique or possible ectopic gestation. Note that the left ovary could not be visualized. A small amount of free pelvic fluid is noted. There is no evidence of mass or extrauterine gestation on this study.  Timing of repeat ultrasound will in significant part depend on serial beta HCG examinations.   Electronically Signed   By: Lowella Grip III M.D.   On: 05/24/2014 10:13   Nm Pulmonary Perf And Vent  05/24/2014   CLINICAL DATA:  Chest pain.  EXAM: NUCLEAR MEDICINE VENTILATION - PERFUSION LUNG SCAN  TECHNIQUE: Ventilation images were obtained in multiple projections using inhaled aerosol technetium 99 M DTPA. Perfusion images were obtained in multiple projections after intravenous injection of Tc-45m MAA.  RADIOPHARMACEUTICALS:  40 mCi Tc-55m DTPA aerosol and 6 mCi Tc-46m MAA  COMPARISON:  Chest x-ray dated 05/23/2014  FINDINGS: Ventilation: No focal ventilation defect.  Perfusion: No wedge shaped peripheral perfusion defects to suggest acute pulmonary embolism.  IMPRESSION: Normal ventilation perfusion lung scan. No evidence of pulmonary embolism.   Electronically Signed   By: Lorriane Shire M.D.   On: 05/24/2014 14:05    Elberta Leatherwood, MD 05/25/2014, 1:45  PM PGY-1, [place] Intern pager: [phone], text pages welcome

## 2014-05-25 NOTE — Progress Notes (Signed)
Primary team was NOT contacted by nursing staff about cardiology recs. Patient was Contacted directly at home (post DC) to inform her of new medications waiting for her per cards recs. Patient answered phone and stated her understanding that prescriptions are waiting for her at pharmacy and that these medications were very important to pick up. Patient had no questions.   Elberta Leatherwood, MD,MS,  PGY1 05/25/2014 5:47 PM

## 2014-05-25 NOTE — Progress Notes (Signed)
UR completed 

## 2014-05-25 NOTE — Progress Notes (Signed)
  Echocardiogram 2D Echocardiogram has been performed.  Bobbye Charleston 05/25/2014, 11:14 AM

## 2014-05-25 NOTE — Progress Notes (Signed)
Patient Name: Sara [name] Date of Encounter: 05/25/2014   Active Problems:   Chest pain   Positive D dimer   Lupus (systemic lupus erythematosus)   ESRD on dialysis   Nausea with vomiting   ESRD (end stage renal disease)   Hypertension   Bipolar 1 disorder   Pregnancy   SUBJECTIVE  Continue to have pain all over the place, around all joints. Chest discomfort is improving. Denies any h/o sickle cell disorder. No SOB  CURRENT MEDS . calcium acetate  2,001 mg Oral TID WC  . darbepoetin (ARANESP) injection - DIALYSIS  40 mcg Intravenous Q Thu-HD  . ferric gluconate (FERRLECIT/NULECIT) IV  62.5 mg Intravenous Q Thu-HD  . multivitamin  1 tablet Oral QHS  . sodium chloride  3 mL Intravenous Q12H    OBJECTIVE  Filed Vitals:   05/24/14 0459 05/24/14 1414 05/24/14 2131 05/25/14 0500  BP: 162/94 141/96 149/98 138/91  Pulse:  99 113 104  Temp:  97.6 F (36.4 C) 98.1 F (36.7 C) 98.2 F (36.8 C)  TempSrc:  Oral Oral Oral  Resp:  20 20 19   Height:      Weight:      SpO2:  97% 98% 96%    Intake/Output Summary (Last 24 hours) at 05/25/14 0827 Last data filed at 05/24/14 2005  Gross per 24 hour  Intake   1080 ml  Output      1 ml  Net   1079 ml   Filed Weights   05/23/14 1558 05/24/14 0102  Weight: 148 lb (67.132 kg) 131 lb 3.2 oz (59.512 kg)    PHYSICAL EXAM  General: NAD. Sitting up in bed, appears uncomfortable.  Neuro: Alert and oriented X 3. Moves all extremities spontaneously. Psych: Normal affect. HEENT:  Normal  Neck: Supple without bruits or JVD. Lungs:  Resp regular and unlabored, CTA. Heart: Tachycardia. no s3, s4. 1/6 murmur at L upper sternal border Abdomen: Soft, non-tender, non-distended, BS + x 4.  Extremities: No clubbing, cyanosis or edema. DP/PT/Radials 2+ and equal bilaterally.  Accessory Clinical Findings  CBC  Recent Labs  05/23/14 1609 05/24/14 0319 05/25/14 0637  WBC 5.7 5.1 5.9  NEUTROABS 4.2  --   --   HGB 10.8* 10.1*  9.8*  HCT 33.4* 31.2* 29.9*  MCV 80.7 83.9 81.7  PLT 279 176 PENDING   Basic Metabolic Panel  Recent Labs  05/24/14 0319 05/25/14 0637  NA 133* 135  K 3.7 4.2  CL 94* 97  CO2 27 23  GLUCOSE 95 75  BUN 37* 47*  CREATININE 12.19* 14.19*  CALCIUM 9.0 9.0  PHOS  --  9.0*   Liver Function Tests  Recent Labs  05/23/14 1609 05/25/14 0637  AST 18  --   ALT 9  --   ALKPHOS 58  --   BILITOT 0.6  --   PROT 7.1  --   ALBUMIN 3.0* 2.6*   Cardiac Enzymes  Recent Labs  05/24/14 0319 05/24/14 0850 05/24/14 1435  TROPONINI 0.06* 0.04* 0.05*   D-Dimer  Recent Labs  05/23/14 2006  DDIMER 3.94*    TELE Sinus tach with HR 90-100s, occasional HR 120s    ECG  No new EKG/ 05/24/2014 sinus tach with TWI in lateral leads  Echocardiogram  pending    Radiology/Studies  Dg Chest 2 View  05/23/2014   CLINICAL DATA:  Chest pain for 3 days.  IMPRESSION: Cardiomegaly.  Question tiny left pleural effusion.   Electronically Signed  By: Andreas Newport M.D.   On: 05/23/2014 21:04   US Ob Comp Less 14 Wks  05/24/2014   CLINICAL DATA:  Assessment for potential intrauterine gestation   IMPRESSION: No intrauterine mass or intrauterine gestation. The patient has reportedly only a minimally positive beta HCG value. Question spurious lab result versus recent spontaneous abortion. Other differential considerations must include intrauterine gestation too early to be seen by either transabdominal or transvaginal technique or possible ectopic gestation. Note that the left ovary could not be visualized. A small amount of free pelvic fluid is noted. There is no evidence of mass or extrauterine gestation on this study.  Timing of repeat ultrasound will in significant part depend on serial beta HCG examinations.   Electronically Signed   By: Lowella Grip III M.D.   On: 05/24/2014 10:13   Nm Pulmonary Perf And Vent  05/24/2014   CLINICAL DATA:  IMPRESSION: Normal ventilation perfusion  lung scan. No evidence of pulmonary embolism.   Electronically Signed   By: Lorriane Shire M.D.   On: 05/24/2014 14:05    ASSESSMENT AND PLAN  1. Pleuritic CP  - 1 wk h/o sharp CP associated to inspiration and coughing, also worse with positional changes  - associated symptom of N/V/D which suspected to be viral gastroenteritis  - Echo pending for possible pericarditis, no rub on exam, does have persistent diastolic HF murmur at L upper sternal border, given 1 dose of colchicine yesterday. If echo normal, no further ischemic workup   2. HTN 3. ESRD on HD TTS 4. Lupus  5. Schizophrenia/psychosis/bipolar I disorder 6. Elevated bHCG: Neg transvaginal u/s. 7. Multiple joint pain along with CP: denies prior h/o sickle cell dx  Weston Brass Woodward Ku Pager: [phone]   The patient was seen, examined and discussed with Almyra Deforest, PA-C and I agree with the above.   34 year old female with pleuritic chest pain and mildly elevated troponin suspicious of myopericarditis. We are unable to do cardiac MRI as she has ESRD and is on HD. Echo is pending. We will continue colchicine, NSAIDS. We will add metoprolol for rate control and hypertension.  Dorothy Spark 05/25/2014

## 2014-05-25 NOTE — Procedures (Signed)
I have personally attended this patient's dialysis session.   2K bath (K 4.2) 4 hour treatment plan (though she usually will not stay on HD more than a couple of hours) 1-2 L goal AVF 400  Jamal Maes, MD [place] Pager 05/25/2014, 12:33 PM

## 2014-05-25 NOTE — Progress Notes (Signed)
The patient's echo shows significant myopericarditis - with moderately decrease LVEF - 30-35% and moderate pericardial effusion suspicious of early tamponade. I recheck the echo images, there is mitral inflow respiratory variation, but RV is not collapsing, IVC is not dilated and pericardial effusion is mild to moderate with location that would be challenging to tap.  I re-examined the patient post hemodialysis and she is asymptomatic and in fact hypertensive. She denies any dizziness or SOB. She still has pleuritic type of pain.  We will start her on low dose Colchicine 0.6 mg po every other day as she has ESRD.  We will start low dose Ibuprofen 200 mg po TID considering ESRD. We will start Prednisone 40 mg po daily x 5 days.   We will arrange for an early appointment early next week with repeat echo to evaluate for pericardial effusion.  For her low EF we will start imdur 30 mg and hydralazine 25 mg po TID.  Dorothy Spark 05/25/2014

## 2014-05-29 ENCOUNTER — Inpatient Hospital Stay: Payer: Self-pay | Admitting: Family Medicine

## 2014-05-31 ENCOUNTER — Telehealth: Payer: Self-pay | Admitting: Cardiology

## 2014-05-31 NOTE — Telephone Encounter (Signed)
Pt c/o BP issue:  Pt called reports since hospital visit Chest pain is gone, she doesn't have any problems with previous back aches and SOB but her High blood pressure and cramping of fingers and toes have increased since she was placed on medications. Pt requests a call back to discuss/sr

## 2014-05-31 NOTE — Telephone Encounter (Signed)
Pt states that her chest pain has stopped.  Pt states that she has cramping in her fingers and hands usually because she has lupus, but since she has been on BP medication the cramping has been worse.   Pt states her BP is elevated . BP yesterday 130/100 at dialysis. Pt states she does not have any other BP readings but that dialysis has a record.  She states she has not talked with dialysis because they did not prescribe 5 BP medications for her.   Before I could finish my conversation with pt she said she would need to call me back later.

## 2014-06-02 ENCOUNTER — Other Ambulatory Visit (HOSPITAL_COMMUNITY): Payer: Self-pay

## 2014-06-07 ENCOUNTER — Inpatient Hospital Stay (HOSPITAL_COMMUNITY)
Admission: EM | Admit: 2014-06-07 | Discharge: 2014-06-13 | DRG: 640 | Disposition: A | Discharge status: Other Acute Inpatient Hospital | Payer: Medicare Other | Attending: Family Medicine | Admitting: Family Medicine

## 2014-06-07 ENCOUNTER — Encounter (HOSPITAL_COMMUNITY): Payer: Self-pay | Admitting: *Deleted

## 2014-06-07 DIAGNOSIS — F17210 Nicotine dependence, cigarettes, uncomplicated: Secondary | ICD-10-CM | POA: Diagnosis present

## 2014-06-07 DIAGNOSIS — Z888 Allergy status to other drugs, medicaments and biological substances status: Secondary | ICD-10-CM

## 2014-06-07 DIAGNOSIS — Z9114 Patient's other noncompliance with medication regimen: Secondary | ICD-10-CM | POA: Diagnosis present

## 2014-06-07 DIAGNOSIS — F250 Schizoaffective disorder, bipolar type: Secondary | ICD-10-CM | POA: Diagnosis not present

## 2014-06-07 DIAGNOSIS — F319 Bipolar disorder, unspecified: Secondary | ICD-10-CM | POA: Diagnosis present

## 2014-06-07 DIAGNOSIS — Z91048 Other nonmedicinal substance allergy status: Secondary | ICD-10-CM | POA: Diagnosis not present

## 2014-06-07 DIAGNOSIS — M3214 Glomerular disease in systemic lupus erythematosus: Secondary | ICD-10-CM | POA: Diagnosis present

## 2014-06-07 DIAGNOSIS — R011 Cardiac murmur, unspecified: Secondary | ICD-10-CM | POA: Diagnosis not present

## 2014-06-07 DIAGNOSIS — F1210 Cannabis abuse, uncomplicated: Secondary | ICD-10-CM | POA: Diagnosis present

## 2014-06-07 DIAGNOSIS — Z9119 Patient's noncompliance with other medical treatment and regimen: Secondary | ICD-10-CM | POA: Diagnosis present

## 2014-06-07 DIAGNOSIS — F22 Delusional disorders: Secondary | ICD-10-CM | POA: Diagnosis present

## 2014-06-07 DIAGNOSIS — Z791 Long term (current) use of non-steroidal anti-inflammatories (NSAID): Secondary | ICD-10-CM | POA: Diagnosis not present

## 2014-06-07 DIAGNOSIS — F259 Schizoaffective disorder, unspecified: Secondary | ICD-10-CM | POA: Diagnosis present

## 2014-06-07 DIAGNOSIS — I120 Hypertensive chronic kidney disease with stage 5 chronic kidney disease or end stage renal disease: Secondary | ICD-10-CM | POA: Diagnosis present

## 2014-06-07 DIAGNOSIS — F203 Undifferentiated schizophrenia: Secondary | ICD-10-CM

## 2014-06-07 DIAGNOSIS — M329 Systemic lupus erythematosus, unspecified: Secondary | ICD-10-CM | POA: Diagnosis present

## 2014-06-07 DIAGNOSIS — D631 Anemia in chronic kidney disease: Secondary | ICD-10-CM | POA: Diagnosis present

## 2014-06-07 DIAGNOSIS — N2581 Secondary hyperparathyroidism of renal origin: Secondary | ICD-10-CM | POA: Diagnosis present

## 2014-06-07 DIAGNOSIS — Z91018 Allergy to other foods: Secondary | ICD-10-CM | POA: Diagnosis not present

## 2014-06-07 DIAGNOSIS — Z881 Allergy status to other antibiotic agents status: Secondary | ICD-10-CM

## 2014-06-07 DIAGNOSIS — I517 Cardiomegaly: Secondary | ICD-10-CM | POA: Diagnosis present

## 2014-06-07 DIAGNOSIS — Z7952 Long term (current) use of systemic steroids: Secondary | ICD-10-CM | POA: Diagnosis not present

## 2014-06-07 DIAGNOSIS — Z992 Dependence on renal dialysis: Secondary | ICD-10-CM | POA: Diagnosis not present

## 2014-06-07 DIAGNOSIS — Z79899 Other long term (current) drug therapy: Secondary | ICD-10-CM

## 2014-06-07 DIAGNOSIS — E875 Hyperkalemia: Secondary | ICD-10-CM | POA: Diagnosis present

## 2014-06-07 DIAGNOSIS — G9349 Other encephalopathy: Secondary | ICD-10-CM | POA: Diagnosis present

## 2014-06-07 DIAGNOSIS — N186 End stage renal disease: Secondary | ICD-10-CM | POA: Diagnosis present

## 2014-06-07 LAB — ACETAMINOPHEN LEVEL

## 2014-06-07 LAB — COMPREHENSIVE METABOLIC PANEL
ALT: 18 U/L (ref 0–35)
ANION GAP: 22 — AB (ref 5–15)
AST: 27 U/L (ref 0–37)
Albumin: 2.9 g/dL — ABNORMAL LOW (ref 3.5–5.2)
Alkaline Phosphatase: 55 U/L (ref 39–117)
BILIRUBIN TOTAL: 0.7 mg/dL (ref 0.3–1.2)
BUN: 122 mg/dL — AB (ref 6–23)
CO2: 19 mmol/L (ref 19–32)
CREATININE: 17.48 mg/dL — AB (ref 0.50–1.10)
Calcium: 9.7 mg/dL (ref 8.4–10.5)
Chloride: 99 mmol/L (ref 96–112)
GFR calc non Af Amer: 2 mL/min — ABNORMAL LOW (ref >90)
GFR, EST AFRICAN AMERICAN: 3 mL/min — AB (ref >90)
Glucose, Bld: 73 mg/dL (ref 70–99)
POTASSIUM: 7.2 mmol/L — AB (ref 3.5–5.1)
Sodium: 140 mmol/L (ref 135–145)
Total Protein: 6.6 g/dL (ref 6.0–8.3)

## 2014-06-07 LAB — PHOSPHORUS: PHOSPHORUS: 3.8 mg/dL (ref 2.3–4.6)

## 2014-06-07 LAB — CBC
HEMATOCRIT: 25.6 % — AB (ref 36.0–46.0)
Hemoglobin: 8.2 g/dL — ABNORMAL LOW (ref 12.0–15.0)
MCH: 27 pg (ref 26.0–34.0)
MCHC: 32 g/dL (ref 30.0–36.0)
MCV: 84.2 fL (ref 78.0–100.0)
PLATELETS: 216 10*3/uL (ref 150–400)
RBC: 3.04 MIL/uL — ABNORMAL LOW (ref 3.87–5.11)
RDW: 18.8 % — ABNORMAL HIGH (ref 11.5–15.5)
WBC: 5.6 10*3/uL (ref 4.0–10.5)

## 2014-06-07 LAB — ETHANOL: Alcohol, Ethyl (B): <5 mg/dL (ref 0–9)

## 2014-06-07 LAB — SALICYLATE LEVEL: Salicylate Lvl: <4 mg/dL (ref 2.8–20.0)

## 2014-06-07 LAB — MAGNESIUM: Magnesium: 2.1 mg/dL (ref 1.5–2.5)

## 2014-06-07 MED ORDER — NEPRO/CARBSTEADY PO LIQD
237.0000 mL | ORAL | Status: DC | PRN
  Filled 2014-06-07: qty 237

## 2014-06-07 MED ORDER — COLCHICINE 0.6 MG PO TABS
0.6000 mg | ORAL_TABLET | ORAL | Status: DC
Start: 2014-06-08 — End: 2014-06-08
  Filled 2014-06-07 (×2): qty 1

## 2014-06-07 MED ORDER — HYDRALAZINE HCL 25 MG PO TABS
25.0000 mg | ORAL_TABLET | Freq: Three times a day (TID) | ORAL | Status: DC
  Administered 2014-06-08 (×2): 25 mg via ORAL
  Filled 2014-06-07 (×9): qty 1

## 2014-06-07 MED ORDER — HALOPERIDOL LACTATE 5 MG/ML IJ SOLN
5.0000 mg | Freq: Four times a day (QID) | INTRAMUSCULAR | Status: DC | PRN
  Administered 2014-06-08 – 2014-06-10 (×3): 5 mg via INTRAVENOUS
  Filled 2014-06-07 (×6): qty 1

## 2014-06-07 MED ORDER — LORAZEPAM 2 MG/ML IJ SOLN: 2.0000 mg | Freq: Once | INTRAMUSCULAR | Status: DC

## 2014-06-07 MED ORDER — SODIUM CHLORIDE 0.9 % IV SOLN
62.5000 mg | INTRAVENOUS | Status: DC
  Administered 2014-06-07: 62.5 mg via INTRAVENOUS
  Filled 2014-06-07: qty 5

## 2014-06-07 MED ORDER — ALTEPLASE 2 MG IJ SOLR
2.0000 mg | Freq: Once | INTRAMUSCULAR | Status: AC | PRN
  Filled 2014-06-07: qty 2

## 2014-06-07 MED ORDER — SODIUM CHLORIDE 0.9 % IV SOLN
INTRAVENOUS | Status: DC
  Administered 2014-06-08: 05:00:00 via INTRAVENOUS

## 2014-06-07 MED ORDER — ZIPRASIDONE MESYLATE 20 MG IM SOLR
10.0000 mg | Freq: Once | INTRAMUSCULAR | Status: AC
  Administered 2014-06-07: 10 mg via INTRAMUSCULAR
  Filled 2014-06-07: qty 20

## 2014-06-07 MED ORDER — STERILE WATER FOR INJECTION IJ SOLN
INTRAMUSCULAR | Status: AC
  Administered 2014-06-07: 10 mL
  Filled 2014-06-07: qty 10

## 2014-06-07 MED ORDER — HEPARIN SODIUM (PORCINE) 1000 UNIT/ML DIALYSIS
1000.0000 [IU] | INTRAMUSCULAR | Status: DC | PRN
  Filled 2014-06-07: qty 1

## 2014-06-07 MED ORDER — HEPARIN SODIUM (PORCINE) 5000 UNIT/ML IJ SOLN
5000.0000 [IU] | Freq: Three times a day (TID) | INTRAMUSCULAR | Status: DC
  Filled 2014-06-07 (×18): qty 1

## 2014-06-07 MED ORDER — DARBEPOETIN ALFA 200 MCG/0.4ML IJ SOSY
200.0000 ug | PREFILLED_SYRINGE | INTRAMUSCULAR | Status: DC
  Administered 2014-06-07: 200 ug via INTRAVENOUS

## 2014-06-07 MED ORDER — SODIUM CHLORIDE 0.9 % IV SOLN: 100.0000 mL | INTRAVENOUS | Status: DC | PRN

## 2014-06-07 MED ORDER — DIPHENHYDRAMINE HCL 50 MG/ML IJ SOLN
50.0000 mg | Freq: Once | INTRAMUSCULAR | Status: AC
  Administered 2014-06-07: 50 mg via INTRAMUSCULAR
  Filled 2014-06-07: qty 1

## 2014-06-07 MED ORDER — LIDOCAINE HCL (PF) 1 % IJ SOLN: 5.0000 mL | INTRAMUSCULAR | Status: DC | PRN

## 2014-06-07 MED ORDER — LORAZEPAM 2 MG/ML IJ SOLN
2.0000 mg | Freq: Once | INTRAMUSCULAR | Status: AC
  Administered 2014-06-07: 2 mg via INTRAMUSCULAR

## 2014-06-07 MED ORDER — DIPHENHYDRAMINE HCL 50 MG/ML IJ SOLN
50.0000 mg | Freq: Once | INTRAMUSCULAR | Status: AC
Start: 2014-06-07 — End: 2014-06-07
  Administered 2014-06-07: 50 mg via INTRAMUSCULAR
  Filled 2014-06-07: qty 1

## 2014-06-07 MED ORDER — BENZTROPINE MESYLATE 1 MG/ML IJ SOLN
1.0000 mg | Freq: Four times a day (QID) | INTRAMUSCULAR | Status: DC | PRN
  Filled 2014-06-07: qty 1

## 2014-06-07 MED ORDER — LIDOCAINE-PRILOCAINE 2.5-2.5 % EX CREA
1.0000 "application " | TOPICAL_CREAM | CUTANEOUS | Status: DC | PRN
  Filled 2014-06-07: qty 5

## 2014-06-07 MED ORDER — SODIUM POLYSTYRENE SULFONATE 15 GM/60ML PO SUSP: 30.0000 g | Freq: Once | ORAL | Status: DC

## 2014-06-07 MED ORDER — HEPARIN SODIUM (PORCINE) 1000 UNIT/ML DIALYSIS
100.0000 [IU]/kg | INTRAMUSCULAR | Status: DC | PRN
  Filled 2014-06-07: qty 7

## 2014-06-07 MED ORDER — LORAZEPAM BOLUS VIA INFUSION
1.0000 mg | Freq: Four times a day (QID) | INTRAVENOUS | Status: DC | PRN
Start: 2014-06-07 — End: 2014-06-07

## 2014-06-07 MED ORDER — ZIPRASIDONE MESYLATE 20 MG IM SOLR
10.0000 mg | Freq: Once | INTRAMUSCULAR | Status: AC
  Administered 2014-06-07: 10 mg via INTRAMUSCULAR

## 2014-06-07 MED ORDER — ZIPRASIDONE MESYLATE 20 MG IM SOLR: 10.0000 mg | Freq: Once | INTRAMUSCULAR | Status: DC

## 2014-06-07 MED ORDER — SODIUM CHLORIDE 0.9 % IV SOLN
1.0000 g | Freq: Once | INTRAVENOUS | Status: AC
  Administered 2014-06-07: 1 g via INTRAVENOUS
  Filled 2014-06-07: qty 10

## 2014-06-07 MED ORDER — ISOSORBIDE MONONITRATE ER 30 MG PO TB24
30.0000 mg | ORAL_TABLET | Freq: Every day | ORAL | Status: DC
  Administered 2014-06-08 – 2014-06-13 (×6): 30 mg via ORAL
  Filled 2014-06-07 (×7): qty 1

## 2014-06-07 MED ORDER — LORAZEPAM 2 MG/ML IJ SOLN
1.0000 mg | Freq: Four times a day (QID) | INTRAMUSCULAR | Status: DC | PRN
  Administered 2014-06-08 – 2014-06-12 (×4): 1 mg via INTRAVENOUS
  Filled 2014-06-07 (×5): qty 1

## 2014-06-07 MED ORDER — ACETAMINOPHEN 500 MG PO TABS
1000.0000 mg | ORAL_TABLET | Freq: Four times a day (QID) | ORAL | Status: DC | PRN
  Administered 2014-06-08 – 2014-06-12 (×9): 1000 mg via ORAL
  Administered 2014-06-13: 500 mg via ORAL
  Administered 2014-06-13: 1000 mg via ORAL
  Filled 2014-06-07 (×12): qty 2

## 2014-06-07 MED ORDER — PENTAFLUOROPROP-TETRAFLUOROETH EX AERO
1.0000 | INHALATION_SPRAY | CUTANEOUS | Status: DC | PRN
Start: 2014-06-07 — End: 2014-06-13
  Filled 2014-06-07: qty 30

## 2014-06-07 MED ORDER — METOPROLOL TARTRATE 25 MG PO TABS
25.0000 mg | ORAL_TABLET | Freq: Two times a day (BID) | ORAL | Status: DC
  Administered 2014-06-08 – 2014-06-13 (×10): 25 mg via ORAL
  Filled 2014-06-07 (×15): qty 1

## 2014-06-07 MED ORDER — DARBEPOETIN ALFA 200 MCG/0.4ML IJ SOSY
PREFILLED_SYRINGE | INTRAMUSCULAR | Status: AC
  Administered 2014-06-07: 200 ug via INTRAVENOUS
  Filled 2014-06-07: qty 0.4

## 2014-06-07 MED ORDER — PREDNISONE 20 MG PO TABS
40.0000 mg | ORAL_TABLET | Freq: Every day | ORAL | Status: DC
  Administered 2014-06-08 – 2014-06-10 (×3): 40 mg via ORAL
  Filled 2014-06-07 (×5): qty 2

## 2014-06-07 NOTE — ED Notes (Signed)
Upon d/c pt. Isn't allowed to return [employer]. Social work needs to be contacted rt getting belongings. Mariann Laster, RN from [employer] came by to check on pt.

## 2014-06-07 NOTE — Procedures (Signed)
I was present at this session.  I have reviewed the session itself and made appropriate changes.  Needles in with some crying, occ cries out.  Sedated.  Follow closely security here and sitter.  Farryn Linares L 3/2/20165:51 PM

## 2014-06-07 NOTE — ED Notes (Signed)
Staffing called for sitter and Security paged for guard.

## 2014-06-07 NOTE — ED Notes (Signed)
Awaiting for pt to calm down before attempting IV start.

## 2014-06-07 NOTE — ED Notes (Signed)
Pt sleeping. 

## 2014-06-07 NOTE — ED Notes (Signed)
Nephrologists states in order to receive dialysis pt must be in restraints at all times and under constant supervision from security.

## 2014-06-07 NOTE — ED Notes (Signed)
Admitting at bedside 
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
## 2014-06-07 NOTE — ED Notes (Signed)
Security has responded and is awaiting for pt to go to dialysis. Sitter has been sent from staffing and we are waiting for them in order to transport pt.

## 2014-06-07 NOTE — Progress Notes (Signed)
Asked to see by ED, with K 7.2.  She refuses dialysis,evaluation, or to do anything here.  She is aggitated, name calling, and threating. Will not attempt eval or dialysis at this time with current mental state as would be detrimental to her and staff.  Did explain significant risk of dying, not sure she comprehends.  Need emergent Psych eval.  Could not dialyze without signif sedation/?paralysis.  

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
## 2014-06-07 NOTE — ED Notes (Signed)
Handcuffs removed from pt by [employer].

## 2014-06-07 NOTE — ED Notes (Signed)
Pt remains handcuffed to the bed.

## 2014-06-07 NOTE — ED Notes (Signed)
Pt placed in handcuffs by [employer].

## 2014-06-08 LAB — CBC
HCT: 24.5 % — ABNORMAL LOW (ref 36.0–46.0)
Hemoglobin: 8.1 g/dL — ABNORMAL LOW (ref 12.0–15.0)
MCH: 27 pg (ref 26.0–34.0)
MCHC: 33.1 g/dL (ref 30.0–36.0)
MCV: 81.7 fL (ref 78.0–100.0)
PLATELETS: 165 10*3/uL (ref 150–400)
RBC: 3 MIL/uL — ABNORMAL LOW (ref 3.87–5.11)
RDW: 18.6 % — AB (ref 11.5–15.5)
WBC: 4.3 10*3/uL (ref 4.0–10.5)

## 2014-06-08 LAB — COMPREHENSIVE METABOLIC PANEL
ALT: 17 U/L (ref 0–35)
AST: 29 U/L (ref 0–37)
Albumin: 2.7 g/dL — ABNORMAL LOW (ref 3.5–5.2)
Alkaline Phosphatase: 55 U/L (ref 39–117)
Anion gap: 12 (ref 5–15)
BILIRUBIN TOTAL: 0.4 mg/dL (ref 0.3–1.2)
BUN: 49 mg/dL — ABNORMAL HIGH (ref 6–23)
CO2: 26 mmol/L (ref 19–32)
Calcium: 8.7 mg/dL (ref 8.4–10.5)
Chloride: 96 mmol/L (ref 96–112)
Creatinine, Ser: 10.02 mg/dL — ABNORMAL HIGH (ref 0.50–1.10)
GFR calc Af Amer: 5 mL/min — ABNORMAL LOW (ref >90)
GFR, EST NON AFRICAN AMERICAN: 5 mL/min — AB (ref >90)
Glucose, Bld: 70 mg/dL (ref 70–99)
POTASSIUM: 4.6 mmol/L (ref 3.5–5.1)
SODIUM: 134 mmol/L — AB (ref 135–145)
Total Protein: 6.2 g/dL (ref 6.0–8.3)

## 2014-06-08 LAB — MAGNESIUM: Magnesium: 2.2 mg/dL (ref 1.5–2.5)

## 2014-06-08 LAB — PHOSPHORUS: PHOSPHORUS: 7.1 mg/dL — AB (ref 2.3–4.6)

## 2014-06-08 LAB — MRSA PCR SCREENING: MRSA BY PCR: NEGATIVE

## 2014-06-08 MED ORDER — NEPRO/CARBSTEADY PO LIQD: 237.0000 mL | Freq: Three times a day (TID) | ORAL | Status: DC | PRN

## 2014-06-08 MED ORDER — HALOPERIDOL 5 MG PO TABS
5.0000 mg | ORAL_TABLET | Freq: Two times a day (BID) | ORAL | Status: DC
  Administered 2014-06-09 – 2014-06-13 (×9): 5 mg via ORAL
  Filled 2014-06-08 (×13): qty 1

## 2014-06-08 NOTE — Progress Notes (Signed)
Family Medicine Teaching Service Daily Progress Note Intern Pager: [phone]  Patient name: Sara [name] Medical record number: [id number] Date of birth: June 18, 1980 Age: 34 y.o. Gender: female  Primary Care Provider: Tommi Rumps, MD Consultants: nephrology; psych Code Status: full  Pt Overview and Major Events to Date:  3/2: admitted for hyperkalemia and acute encephalopathy 3/3: attempted to leave hospital; brought back by security.  Assessment and Plan: Sara [name] is a 34 y.o. female presenting with mania/psychosis and hyperkalemia. PMH is significant for lupus, ESRD secondary to lupus nephritis, hypertension, bipolar 1 disorder, schizophrenia, psychosis.  #Acute Encephalopathy: Suspicion for psychosis vs uremia. Patient picked up by police after throwing shoes and walking in the street. IVC filed. Patient unable to provide much history secondary to sedation. Patient has a history significant for bipolar 1, schizophrenia, psychosis. At this time I do NOT feel patient has the capacity to make medical decisions. Psychiatry has been consulted to further investigate this. - Psychiatry consulted >> has not yet seen patient today. - 5 mg Haldol, 1 mg Ativan, 1 mg benztropine all PRN every 6 hours for agitation/psychosis - Restarting home Haldol 5 mg BID; Hydroxyzine 25mg  QID - Repeat labs in AM - regular diet - IV fluids: DC normal saline at 50 mL/hour >> Saline Lock  #Hyperkalemia: 7.2 on admission. Patient on regular schedule for hemodialysis. Previous note states she is on a T/Th/Sat schedule. - Nephrology consulted; appreciate the recommendations. - HD performed 3/2 PM  - K+ 4.6 (3/3) - Calcium gluconate 1 g IV provided in ED - Kayexalate 30 g by mouth ordered in ED however unable to be provided due to mental status.  #ESRD: Tuesday Thursday Saturday schedule - Anticipating HD in near future - See above  #SLE: Patient has long history of confirmed  diagnosis - Continue home daily regimen of prednisone - Continue home colchicine  #Hypertension: 143/87 at admission. - Continue home hydralazine - Continue home Imdur - Continue home metoprolol  #History of substance abuse - per police Marijuana was found on the patient - check UDS as intoxication may be contributing to current encephalopathy   FEN/GI: NPO; normal saline 50 ml/hour Prophylaxis: Subcutaneous heparin  Disposition: pending.  Subjective:  Patient was laying in bed w/ waxing/waining consciousness and/or attention. No agitation or aggression at this time. Patient still w/ soft restraints.  Patient attempted to leave the hospital. Was tracked down by security in parking lot. Anticipating Psych to see patient today.  Objective: Temp:  [97.2 F (36.2 C)-98 F (36.7 C)] 97.9 F (36.6 C) (03/03 0000) Pulse Rate:  [85-128] 97 (03/03 1300) Resp:  [0-28] 21 (03/03 1300) BP: (111-157)/(71-118) 137/91 mmHg (03/03 1145) SpO2:  [97 %-100 %] 97 % (03/03 1300) Weight:  [143 lb 11.8 oz (65.2 kg)] 143 lb 11.8 oz (65.2 kg) (03/02 2215) Physical Exam: General -- lethargic. Oriented 3. In and out of sleep. HEENT -- Head is normocephalic.PERRL. Significant ptosis. Dentition poor. No lymphadenopathy  Chest -- expansion minimal secondary to effort. Lungs clear to auscultation. Cardiac -- RRR. No murmurs noted. Bruit appreciated likely secondary to RUE fistula Abdomen -- soft, nontender. No masses palpable. Bowel sounds present. CNS -- patient is lethargic, oriented x3. No focal deficits appreciated. No longer agitated or violent. Extremeties - ROM good. Nonedematous. RUE with previous AV fistula site. Dorsalis pedis pulses present and symmetrical.   Laboratory:  Recent Labs Lab 06/07/14 1246 06/08/14 1012  WBC 5.6 4.3  HGB 8.2* 8.1*  HCT 25.6* 24.5*  PLT  216 165    Recent Labs Lab 06/07/14 1246 06/08/14 1012  NA 140 134*  K 7.2* 4.6  CL 99 96  CO2 19 26  BUN 122*  49*  CREATININE 17.48* 10.02*  CALCIUM 9.7 8.7  PROT 6.6 6.2  BILITOT 0.7 0.4  ALKPHOS 55 55  ALT 18 17  AST 27 29  GLUCOSE 73 70    Imaging/Diagnostic Tests: none  Elberta Leatherwood, MD 06/08/2014, 1:19 PM PGY-1, [place] Intern pager: [phone], text pages welcome

## 2014-06-08 NOTE — Progress Notes (Signed)
Pt being verbally abusive, yelling loudly, and threatening to get out of bed to Pharmacist, community and staff at bedside. PRN medication given to calm patient down. Pt complaining and yelling out that's she's hungry but currently NPO. Spoke with Dr. Tamala Julian and asked if pt could have something to eat. Provider stated pt can have crackers and a little to drink, nothing much until Psych sees her. Crackers offered to pt and pt declined.
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
## 2014-06-08 NOTE — Progress Notes (Signed)
Unable to do a fully skin assessment on patient. Pt refusing to be touched.

## 2014-06-08 NOTE — Progress Notes (Signed)
Pt transferred to 2c from Dialysis with security and sitter. Pt currently has on bilateral soft wrist restraints. Pt is verbally abusive and threatening to fight staff at bedside. New order received from Dr. Tamala Julian for bilateral wrist and ankle restraints due to patient trying to kick the nurses.
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
## 2014-06-08 NOTE — Progress Notes (Signed)
Utilization Review Completed.  

## 2014-06-08 NOTE — Progress Notes (Signed)
Around 11am pt had a bath and said she wants to go out to smoke we told her she cant and explained to her why but she still insisted and  got upset , nurse manager was at bedside talking to her but pt just left the room and headed the exit door, security was called, and MD was paged.

## 2014-06-08 NOTE — Progress Notes (Signed)
Patient refuses to be examined, evaluated, is threatening.  Will not plan dialysis until she is controlled.  Not sure what plan can be at this point.

## 2014-06-08 NOTE — Progress Notes (Signed)
Four point restraints  was renewed per verbal order of Dr. Alease Frame.

## 2014-06-09 DIAGNOSIS — F250 Schizoaffective disorder, bipolar type: Secondary | ICD-10-CM

## 2014-06-09 LAB — BASIC METABOLIC PANEL
Anion gap: 15 (ref 5–15)
BUN: 56 mg/dL — AB (ref 6–23)
CHLORIDE: 98 mmol/L (ref 96–112)
CO2: 24 mmol/L (ref 19–32)
CREATININE: 11.71 mg/dL — AB (ref 0.50–1.10)
Calcium: 9.3 mg/dL (ref 8.4–10.5)
GFR calc Af Amer: 4 mL/min — ABNORMAL LOW (ref >90)
GFR calc non Af Amer: 4 mL/min — ABNORMAL LOW (ref >90)
Glucose, Bld: 97 mg/dL (ref 70–99)
POTASSIUM: 4.6 mmol/L (ref 3.5–5.1)
Sodium: 137 mmol/L (ref 135–145)

## 2014-06-09 LAB — CBC
HCT: 23.9 % — ABNORMAL LOW (ref 36.0–46.0)
Hemoglobin: 7.7 g/dL — ABNORMAL LOW (ref 12.0–15.0)
MCH: 26.5 pg (ref 26.0–34.0)
MCHC: 32.2 g/dL (ref 30.0–36.0)
MCV: 82.1 fL (ref 78.0–100.0)
Platelets: 171 10*3/uL (ref 150–400)
RBC: 2.91 MIL/uL — ABNORMAL LOW (ref 3.87–5.11)
RDW: 18.7 % — ABNORMAL HIGH (ref 11.5–15.5)
WBC: 4.2 10*3/uL (ref 4.0–10.5)

## 2014-06-09 LAB — HEPATITIS B SURFACE ANTIGEN: HEP B S AG: NEGATIVE

## 2014-06-09 MED ORDER — RENA-VITE PO TABS
1.0000 | ORAL_TABLET | Freq: Every day | ORAL | Status: DC
  Administered 2014-06-09 – 2014-06-12 (×4): 1 via ORAL
  Filled 2014-06-09 (×6): qty 1
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
## 2014-06-09 NOTE — Care Management Note (Signed)
    Page 1 of 1   06/09/2014     1:56:01 PM CARE MANAGEMENT NOTE 06/09/2014  Patient:  Sara [name], Sara [name]   Account Number:  [id number]  Date Initiated:  06/09/2014  Documentation initiated by:  Oleg Oleson  Subjective/Objective Assessment:   dx hyperkalemia/encephalopathy    PCP  Tommi Rumps     In-house referral  Clinical Social Worker      DC Planning Services  CM consult      Status of service:  Completed, signed off Medicare Important Message given?  NO (If response is "NO", the following Medicare IM given date fields will be blank) Date Medicare IM given:   Medicare IM given by:   Date Additional Medicare IM given:   Additional Medicare IM given by:    Per UR Regulation:  Reviewed for med. necessity/level of care/duration of stay  Comments:  06/09/14 [place] MSN BSN CCM Received request for inpatient psych placement, referred request to psych social worker.

## 2014-06-09 NOTE — Procedures (Signed)
I was present at this session.  I have reviewed the session itself and made appropriate changes.  HD via.  RUA avf.  Access press ok.  sedated  Ayaan Shutes L 3/4/20161:37 PM

## 2014-06-09 NOTE — Discharge Summary (Signed)
[facility] Discharge Summary  Patient name: AROHI SALVATIERRA Medical record number: [id number] Date of birth: Oct 22, 1980 Age: 34 y.o. Gender: female Date of Admission: 06/07/2014  Date of Discharge: 06/13/2014 Admitting Physician: Lupita Dawn, MD  Primary Care Provider: Tommi Rumps, MD Consultants: Psych; Nephrology   Indication for Hospitalization: Acute encephalopathy, hyperkalemia  Discharge Diagnoses/Problem List:  Patient Active Problem List   Diagnosis Date Noted  . Hyperkalemia 06/07/2014  . Undifferentiated schizophrenia   . Pain in the chest   . Chest pain 05/24/2014  . Positive D dimer   . Lupus (systemic lupus erythematosus)   . ESRD on dialysis   . Nausea with vomiting   . ESRD (end stage renal disease)   . Hypertension   . Bipolar 1 disorder   . Pregnancy   . Volume overload 03/13/2014  . Fluid overload 03/13/2014  . Schizophrenia 02/20/2014  . Tobacco abuse 02/20/2014  . Anemia 02/20/2014  . History of bipolar disorder   . End stage renal disease on dialysis 07/19/2013  . Mood disorder 07/19/2013  . Aggressive behavior 07/19/2013  . Patient left without being seen 05/29/2013  . Acute psychosis 05/14/2013  . Anemia of chronic disease 05/14/2013  . Homicidal ideations 05/14/2013  . Schizoaffective disorder 05/14/2013  . CKD (chronic kidney disease) stage 5, GFR less than 15 ml/min 03/08/2013  . Renal failure 03/07/2013  . Concussion 12/10/2012  . Facial laceration 12/10/2012  . Forehead laceration 12/10/2012  . Edema 09/14/2012  . 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 Elevated serum creatinine 08/16/2012  . Psychosis 08/16/2012  . Mania 08/16/2012  . Positive ANA (antinuclear antibody) 08/16/2012  . 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 Proteinuria 07/30/2012  . HTN (hypertension) 07/30/2012  . Vaginitis 07/16/2012  . Amenorrhea 01/08/2011  . Galactorrhea 01/08/2011  . Morbid obesity 01/08/2011  . Genital herpes,  unspecified 01/08/2011   Disposition: To [facility]   Discharge Condition: Stable  Discharge Exam:  Blood pressure 150/96, pulse 88, temperature 98 F (36.7 C), temperature source Oral, resp. rate 18, height 5\' 7"  (1.702 m), weight 142 lb 13.7 oz (64.8 kg), SpO2 100 %.  General - No distress, sitting up in bed, pleasant. HEENT - Head is normocephalic. Dentition poor. MMM Chest - Lungs clear to auscultation. Cardiac -RRR. No murmurs noted Abdomen - soft, nontender. No masses palpable. Bowel sounds present. CNS - oriented x3, no focal deficits appreciated Psych - mood appropritate Extremeties - ROM good. Nonedematous. RUE with previous AV fistula site. Dorsalis pedis pulses present and symmetrical.   Brief Hospital Course:  Patient is a 34 year old female who presented with acute agitation and psychosis. Patient was brought in by the [employer]. According to the staff and officer present patient been exhibiting manic behaviors, had run into the road, and began throwing her shoes. At that time IVC papers were filed. Patient was a very poor historian however he was noted through her history that she is on chronic dialysis with ESRD. Potassium noted at 7.2 on admission. While in the ED nephrology came to see patient. Patient was extremely agitated and threatened the nephrology staff. At that point nephrology signed off of her care until patient's agitation was under control.   In the ED patient received 2 individual doses of IM Geodon 10 mg as well as 2 mg of Ativan and 50 mg of Benadryl. Patient was adequately sedated upon the time admitting team saw her in the ED. Patient was admitted to the stepdown unit for medical  management.  Nephrology was consulted upon admission due to history of ESRD. They helped coordinate patient's hemodialysis while inpatient.   Psychiatry was consulted to discuss optimal ways to control patient's agitation/acute encephalopathy while patient was still in the ED.  Psychiatry recommendations were Haldol 5 mg every 6 hours when necessary, Ativan 1 mg every 6 hours when necessary, and/or benztropine 1 mg every 6 hours when necessary. Psychiatry recommended psychiatric inpatient admission upon medical clearance.  Patient continued to have some episodes of agitation but was not violent during these times. Encephalopathy had resolved. Patient was placed on home dose of oral Haldol 5 mg twice a day. Previously stated PRN medications were kept on board in case patient's agitation resumed. On 3/4 patient was deemed medically stable for discharge to psychiatric inpatient, with the obvious caveat of available hemodialysis. Patient will be discharged to [facility] for inpatient psych. She currently has an active IVC form.   Issues for Follow Up:  1. Needs nephrology follow-up for HD  Significant Procedures: None  Significant Labs and Imaging:   Recent Labs Lab 06/09/14 1341 06/10/14 1128 06/12/14 0701  WBC 4.2 3.9* 7.7  HGB 7.7* 7.9* 8.1*  HCT 23.9* 25.0* 25.5*  PLT 171 226 314    Recent Labs Lab 06/07/14 1246 06/07/14 2255 06/08/14 1012 06/09/14 0807 06/10/14 1128 06/12/14 0701 06/13/14 0900  NA 140  --  134* 137 136 133* 136  K 7.2*  --  4.6 4.6 3.7 4.0 3.7  CL 99  --  96 98 97 99 99  CO2 19  --  26 24 26 22 25   GLUCOSE 73  --  70 97 109* 101* 114*  BUN 122*  --  49* 56* 33* 67* 43*  CREATININE 17.48*  --  10.02* 11.71* 8.69* 11.63* 8.38*  CALCIUM 9.7  --  8.7 9.3 8.3* 8.7  8.7 9.3  MG  --  2.1 2.2  --   --   --   --   PHOS  --  3.8 7.1*  --  6.6* 5.3* 3.7  ALKPHOS 55  --  55  --   --   --   --   AST 27  --  29  --   --   --   --   ALT 18  --  17  --   --   --   --   ALBUMIN 2.9*  --  2.7*  --  2.6* 2.7* 2.9*    Results/Tests Pending at Time of Discharge: None  Discharge Medications:    Medication List    TAKE these medications        acetaminophen 500 MG tablet  Commonly known as:  TYLENOL  Take 1,000 mg by mouth every 6  (six) hours as needed for moderate pain.     calcium acetate 667 MG capsule  Commonly known as:  PHOSLO  Take 667-2,001 mg by mouth 4 (four) times daily. Take 2001 mg with breakfast, lunch and dinner, and 667 mg with a snack.     clobetasol cream 0.05 %  Commonly known as:  TEMOVATE  Apply 1 application topically 2 (two) times daily as needed.     colchicine 0.6 MG tablet  Take 1 tablet (0.6 mg total) by mouth every other day.     haloperidol 5 MG tablet  Commonly known as:  HALDOL  Take 1 tablet (5 mg total) by mouth 2 (two) times daily.     hydrALAZINE 25 MG tablet  Commonly known as:  APRESOLINE  Take 1 tablet (25 mg total) by mouth 3 (three) times daily.     ibuprofen 200 MG tablet  Commonly known as:  ADVIL  Take 1 tablet (200 mg total) by mouth 3 (three) times daily.     isosorbide mononitrate 30 MG 24 hr tablet  Commonly known as:  IMDUR  Take 1 tablet (30 mg total) by mouth daily.     metoprolol tartrate 25 MG tablet  Commonly known as:  LOPRESSOR  Take 1 tablet (25 mg total) by mouth 2 (two) times daily.        Discharge Instructions: Please refer to Patient Instructions section of EMR for full details.  Patient was counseled important signs and symptoms that should prompt return to medical care, changes in medications, dietary instructions, activity restrictions, and follow up appointments.   Patient be transferred to inpatient psych at [facility].    Katheren Shams, DO 06/13/2014, 5:21 PM PGY-1, [facility]

## 2014-06-09 NOTE — Progress Notes (Signed)
Subjective: Interval History: has complaints does not want to be here, needs to leave.  Agrees to dialysis.  Objective: Vital signs in last 24 hours: Temp:  [98.1 F (36.7 C)-98.6 F (37 C)] 98.6 F (37 C) (03/04 0400) Pulse Rate:  [93-120] 109 (03/04 0400) Resp:  [17-24] 24 (03/04 0400) BP: (131-148)/(84-107) 133/97 mmHg (03/04 0400) SpO2:  [96 %-99 %] 97 % (03/04 0400) Weight:  [64.3 kg (141 lb 12.1 oz)] 64.3 kg (141 lb 12.1 oz) (03/04 0400) Weight change: -0.9 kg (-1 lb 15.8 oz)  Intake/Output from previous day: 03/03 0701 - 03/04 0700 In: 560 [P.O.:360; I.V.:200] Out: -  Intake/Output this shift:    General appearance: flight of ideas, initially combative but settles down, irrational Resp: rales bibasilar and rhonchi bilaterally Cardio: S1, S2 normal, systolic murmur: holosystolic 2/6, blowing at apex and friction rub heard soft, apex GI: liver down 6 cm, pos bs Extremities: AVF RUA  Lab Results:  Recent Labs  06/07/14 1246 06/08/14 1012  WBC 5.6 4.3  HGB 8.2* 8.1*  HCT 25.6* 24.5*  PLT 216 165   BMET:  Recent Labs  06/07/14 1246 06/08/14 1012  NA 140 134*  K 7.2* 4.6  CL 99 96  CO2 19 26  GLUCOSE 73 70  BUN 122* 49*  CREATININE 17.48* 10.02*  CALCIUM 9.7 8.7   No results for input(s): PTH in the last 72 hours. Iron Studies: No results for input(s): IRON, TIBC, TRANSFERRIN, FERRITIN in the last 72 hours.  Studies/Results: No results found.  I have reviewed the patient's current medications.  Assessment/Plan: 1 ESRD will attempt HD with security , sedation. Irrational and concerned may flare and be dangerous to self and other. 2 Anemia 3 Psychosis  A little better, but not in control and needs short and long term solution including close monitoring and supervision 4 HPTH will not address P Attempt HD, meds    LOS: 2 days   Darely Becknell L 06/09/2014,7:32 AM

## 2014-06-10 DIAGNOSIS — N186 End stage renal disease: Secondary | ICD-10-CM

## 2014-06-10 DIAGNOSIS — Z992 Dependence on renal dialysis: Secondary | ICD-10-CM

## 2014-06-10 LAB — CBC
HCT: 25 % — ABNORMAL LOW (ref 36.0–46.0)
Hemoglobin: 7.9 g/dL — ABNORMAL LOW (ref 12.0–15.0)
MCH: 26.9 pg (ref 26.0–34.0)
MCHC: 31.6 g/dL (ref 30.0–36.0)
MCV: 85 fL (ref 78.0–100.0)
PLATELETS: 226 10*3/uL (ref 150–400)
RBC: 2.94 MIL/uL — ABNORMAL LOW (ref 3.87–5.11)
RDW: 19.1 % — ABNORMAL HIGH (ref 11.5–15.5)
WBC: 3.9 10*3/uL — ABNORMAL LOW (ref 4.0–10.5)

## 2014-06-10 LAB — RAPID URINE DRUG SCREEN, HOSP PERFORMED
AMPHETAMINES: NOT DETECTED
Barbiturates: NOT DETECTED
Benzodiazepines: NOT DETECTED
Cocaine: NOT DETECTED
OPIATES: NOT DETECTED
TETRAHYDROCANNABINOL: NOT DETECTED

## 2014-06-10 LAB — RENAL FUNCTION PANEL
Albumin: 2.6 g/dL — ABNORMAL LOW (ref 3.5–5.2)
Anion gap: 13 (ref 5–15)
BUN: 33 mg/dL — AB (ref 6–23)
CHLORIDE: 97 mmol/L (ref 96–112)
CO2: 26 mmol/L (ref 19–32)
Calcium: 8.3 mg/dL — ABNORMAL LOW (ref 8.4–10.5)
Creatinine, Ser: 8.69 mg/dL — ABNORMAL HIGH (ref 0.50–1.10)
GFR calc Af Amer: 6 mL/min — ABNORMAL LOW (ref >90)
GFR calc non Af Amer: 5 mL/min — ABNORMAL LOW (ref >90)
GLUCOSE: 109 mg/dL — AB (ref 70–99)
Phosphorus: 6.6 mg/dL — ABNORMAL HIGH (ref 2.3–4.6)
Potassium: 3.7 mmol/L (ref 3.5–5.1)
SODIUM: 136 mmol/L (ref 135–145)

## 2014-06-10 NOTE — Progress Notes (Signed)
Subjective: Interval History: has complaints wants to go home.  Objective: Vital signs in last 24 hours: Temp:  [97.5 F (36.4 C)-98 F (36.7 C)] 98 F (36.7 C) (03/05 0403) Pulse Rate:  [86-106] 86 (03/05 0403) Resp:  [16-20] 16 (03/05 0403) BP: (125-163)/(81-95) 151/87 mmHg (03/05 0403) SpO2:  [100 %] 100 % (03/05 0403) Weight:  [62.2 kg (137 lb 2 oz)-66.7 kg (147 lb 0.8 oz)] 62.2 kg (137 lb 2 oz) (03/04 1600) Weight change: 2.4 kg (5 lb 4.7 oz)  Intake/Output from previous day: 03/04 0701 - 03/05 0700 In: 60 [P.O.:60] Out: 1837 [Urine:100; Stool:1] Intake/Output this shift: Total I/O In: 240 [P.O.:240] Out: -   General appearance: pressured speech, flight of ideas Back: scattered rhonchi Cardio: S1, S2 normal and systolic murmur: systolic ejection 2/6, decrescendo at 2nd left intercostal space Extremities: edema 1+ and AVF RUA  Lab Results:  Recent Labs  06/08/14 1012 06/09/14 1341  WBC 4.3 4.2  HGB 8.1* 7.7*  HCT 24.5* 23.9*  PLT 165 171   BMET:  Recent Labs  06/08/14 1012 06/09/14 0807  NA 134* 137  K 4.6 4.6  CL 96 98  CO2 26 24  GLUCOSE 70 97  BUN 49* 56*  CREATININE 10.02* 11.71*  CALCIUM 8.7 9.3   No results for input(s): PTH in the last 72 hours. Iron Studies: No results for input(s): IRON, TIBC, TRANSFERRIN, FERRITIN in the last 72 hours.  Studies/Results: No results found.  I have reviewed the patient's current medications.  Assessment/Plan: 1 ESRD signed of HD as is trend. Danger to self 2 Schizo  Still active schiz, would not let go home.  Needs long term care. 3 anemia difficult to address with current situation 4 HPTH out of control P HD on Mon,  Patient danger to self and others.     LOS: 3 days   Lasonya Hubner L 06/10/2014,9:06 AM

## 2014-06-10 NOTE — Progress Notes (Signed)
Family Medicine Teaching Service Daily Progress Note Intern Pager: [phone]  Patient name: Sara [name] Medical record number: [id number] Date of birth: 1980/06/05 Age: 34 y.o. Gender: female  Primary Care Provider: Tommi Rumps, MD Consultants: nephrology; psych Code Status: full  Pt Overview and Major Events to Date:  3/2: admitted for hyperkalemia and acute encephalopathy 3/3: attempted to leave hospital; brought back by security. 3/4: Deemed medically stable for DC with HD in place  Assessment and Plan: [name] is a 34 y.o. female presenting with mania/psychosis and hyperkalemia. PMH is significant for lupus, ESRD secondary to lupus nephritis, hypertension, bipolar 1 disorder, schizophrenia, psychosis.  #Acute Encephalopathy: Suspicion for psychosis vs uremia - Psychiatry consulted >> Has seen patient; assessed as qualifying for inpatient psych as long as medically stable >> patient w/ K+ 4.6, Na+ 137 >> deemed medically stable for DC as long as HD is in place. (T/Th/Sat schedule) - 5 mg Haldol, 1 mg Ativan, 1 mg benztropine all PRN every 6 hours for agitation/psychosis - Continue home Haldol 5 mg BID; Hydroxyzine 25mg  QID - Repeat labs in AM - Renal/carb modified diet  #Hyperkalemia: 7.2 on admission. Patient on regular schedule for hemodialysis. Previous note states she is on a T/Th/Sat schedule. Improved with dialysis.  #ESRD: Tuesday Thursday Saturday schedule at home - Nephrology consulted - patient receiving dialysis, last session 3/4  #Anemia of CKD: hemoglobin down to 7.7 on 3/4. No evidence of bleed - follow-up CBC today - management per nephrology  #SLE: Patient has long history of confirmed diagnosis - Continue home daily regimen of prednisone  #Hypertension: 143/87 at admission. - Continue home hydralazine - Continue home Imdur - Continue home metoprolol  #History of substance abuse - per police Marijuana was found on the patient - UDS  "needs to be collected"  However, patient making minimal urine  FEN/GI: Renal/carb modified diet;SLIV Prophylaxis: Subcutaneous heparin (patient has been refusing for the past two days), SCDs (patient states she will refuse as they make her feel like she is shackled) Discussed risks for refusing DVT prophylaxis, including DVT and PE.  Disposition: discharge to inpatient psychiatric facility once placement options available  Subjective:  Patient desires to go home so she can celebrate her birthday with her friends. States she is medically stable. No complaints overnight.  Objective: Temp:  [97.4 F (36.3 C)-98 F (36.7 C)] 98 F (36.7 C) (03/05 0403) Pulse Rate:  [84-106] 86 (03/05 0403) Resp:  [16-20] 16 (03/05 0403) BP: (125-163)/(81-95) 151/87 mmHg (03/05 0403) SpO2:  [10 %-100 %] 100 % (03/05 0403) Weight:  [137 lb 2 oz (62.2 kg)-147 lb 0.8 oz (66.7 kg)] 137 lb 2 oz (62.2 kg) (03/04 1600)  Physical Exam: General -- No distress, laying comfortably in bed HEENT -- Head is normocephalic. Dentition poor. Chest -- Lungs clear to auscultation. Cardiac -- RRR. No murmurs noted Abdomen -- soft, nontender. No masses palpable. Bowel sounds present. CNS -- oriented x3, Psych -- delusions,  Extremeties - ROM good. Nonedematous. RUE with previous AV fistula site. Dorsalis pedis pulses present and symmetrical.   Laboratory:  Recent Labs Lab 06/07/14 1246 06/08/14 1012 06/09/14 1341  WBC 5.6 4.3 4.2  HGB 8.2* 8.1* 7.7*  HCT 25.6* 24.5* 23.9*  PLT 216 165 171    Recent Labs Lab 06/07/14 1246 06/08/14 1012 06/09/14 0807  NA 140 134* 137  K 7.2* 4.6 4.6  CL 99 96 98  CO2 19 26 24   BUN 122* 49* 56*  CREATININE 17.48* 10.02*  11.71*  CALCIUM 9.7 8.7 9.3  PROT 6.6 6.2  --   BILITOT 0.7 0.4  --   ALKPHOS 55 55  --   ALT 18 17  --   AST 27 29  --   GLUCOSE 73 70 97    Imaging/Diagnostic Tests: No results found.  Cordelia Poche, MD 06/10/2014, 6:24 AM PGY-2, [place] Intern pager: [phone], text pages welcome

## 2014-06-10 NOTE — Progress Notes (Signed)
CSW faxed referral to [facility] for inpatient psych transfer.  [facility] denies due to need for dialysis.  [facility] was at capacity as well.  CSW will continue to work on disposition to a inpatient psych facility.  [facility] CSW [phone]

## 2014-06-11 DIAGNOSIS — F259 Schizoaffective disorder, unspecified: Secondary | ICD-10-CM

## 2014-06-11 MED ORDER — PREDNISONE 20 MG PO TABS
40.0000 mg | ORAL_TABLET | Freq: Every day | ORAL | Status: DC
  Administered 2014-06-11 – 2014-06-13 (×3): 40 mg via ORAL
  Filled 2014-06-11 (×6): qty 2

## 2014-06-11 NOTE — Progress Notes (Signed)
CSW spoke with [facility] CSW, patient is in need of inpatient placement.  CSW pursued inpatient placement for patient. CSW contacted multiple facilities who reported they are at capacity and are not taking referrals for consideration today.  Most facilities reported to call back tomorrow.    Referral was faxed to [facility] by [facility] CSW.    Pending: [facility]  At Capacity: [facility] [place] [facility] [place]  [place], [place] Disposition Social Worker [phone]

## 2014-06-11 NOTE — Progress Notes (Signed)
Subjective: Interval History: has no complaint, apologizes for behavior.  Objective: Vital signs in last 24 hours: Temp:  [97.5 F (36.4 C)-97.9 F (36.6 C)] 97.8 F (36.6 C) (03/06 0438) Pulse Rate:  [98-102] 102 (03/06 0438) Resp:  [16-18] 17 (03/06 0438) BP: (132-140)/(79-95) 140/95 mmHg (03/06 0438) SpO2:  [97 %-100 %] 97 % (03/06 0438) Weight:  [63.504 kg (140 lb)] 63.504 kg (140 lb) (03/05 2037) Weight change: -3.196 kg (-7 lb 0.8 oz)  Intake/Output from previous day: 03/05 0701 - 03/06 0700 In: 840 [P.O.:840] Out: -  Intake/Output this shift:    General appearance: alert, cooperative and no distress Resp: rhonchi bibasilar Cardio: S1, S2 normal GI: soft, liver down 5 cm, pos bs Extremities: AVF RUA B&T  Lab Results:  Recent Labs  06/09/14 1341 06/10/14 1128  WBC 4.2 3.9*  HGB 7.7* 7.9*  HCT 23.9* 25.0*  PLT 171 226   BMET:  Recent Labs  06/09/14 0807 06/10/14 1128  NA 137 136  K 4.6 3.7  CL 98 97  CO2 24 26  GLUCOSE 97 109*  BUN 56* 33*  CREATININE 11.71* 8.69*  CALCIUM 9.3 8.3*   No results for input(s): PTH in the last 72 hours. Iron Studies: No results for input(s): IRON, TIBC, TRANSFERRIN, FERRITIN in the last 72 hours.  Studies/Results: No results found.  I have reviewed the patient's current medications.  Assessment/Plan: 1 ESRD plan HD in am. Needs full tx 2 Psychosis  Needs long term tx 3 Anemia should improve with epo, will check Fe 4 HTN controlled on meds and with psych control 5 HPTH P HD, epo check Fe, PTH    LOS: 4 days   Arno Cullers L 06/11/2014,9:37 AM
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
## 2014-06-11 NOTE — Progress Notes (Signed)
Family Medicine Teaching Service Daily Progress Note Intern Pager: [phone]  Patient name: Sara [name] Medical record number: [id number] Date of birth: January 25, 1981 Age: 34 y.o. Gender: female  Primary Care Provider: Tommi Rumps, MD Consultants: nephrology; psych Code Status: full  Pt Overview and Major Events to Date:  3/2: admitted for hyperkalemia and acute encephalopathy 3/3: attempted to leave hospital; brought back by security. 3/4: Deemed medically stable for DC with HD in place  Assessment and Plan: Sara [name] is a 34 y.o. female presenting with mania/psychosis and hyperkalemia. PMH is significant for lupus, ESRD secondary to lupus nephritis, hypertension, bipolar 1 disorder, schizophrenia, psychosis.  #Acute Encephalopathy: Suspicion for psychosis vs uremia - Psychiatry consulted >> Has seen patient; assessed as qualifying for inpatient psych as long as medically stable >> patient w/ K+ 4.6, Na+ 137 >> deemed medically stable for DC as long as HD is in place. (T/Th/Sat schedule) - 5 mg Haldol, 1 mg Ativan, 1 mg benztropine all PRN every 6 hours for agitation/psychosis - Continue home Haldol 5 mg BID; Hydroxyzine 25mg  QID - Repeat labs in AM - Renal/carb modified diet  #Hyperkalemia: - Resolved. 7.2 on admission. Patient on regular schedule for hemodialysis. Previous note states she is on a T/Th/Sat schedule. Improved with dialysis. - continue to monitor  #ESRD: Tuesday Thursday Saturday schedule at home - Nephrology consulted - patient receiving dialysis, last session 3/4 - Scheduled for HD 3/7  #Anemia of CKD: hemoglobin stable 7.9 on 3/5. No evidence of bleed - follow-up CBC  - management per nephrology  #SLE: Patient has long history of confirmed diagnosis - Continue home daily regimen of prednisone  #Hypertension: 143/87 at admission. - Discontinue home hydralazine 3/4 - Continue home Imdur & metoprolol  #History of substance abuse - per police  Marijuana was found on the patient - UDS "needs to be collected"  However, patient making minimal urine  FEN/GI: Renal/carb modified diet;SLIV Prophylaxis: Subcutaneous heparin (patient has been refusing for the past two days), SCDs (patient states she will refuse as they make her feel like she is shackled) Discussed risks for refusing DVT prophylaxis, including DVT and PE.  Disposition: discharge to inpatient psychiatric facility once placement options available  Subjective:  Patient continues to ask to go home so she can celebrate her birthday with her friends "drink and dance". States she is medically stable. No medical complaints at this time.  Objective: Temp:  [97.5 F (36.4 C)-98 F (36.7 C)] 97.8 F (36.6 C) (03/06 0438) Pulse Rate:  [98-102] 102 (03/06 0438) Resp:  [16-18] 17 (03/06 0438) BP: (132-140)/(79-95) 140/95 mmHg (03/06 0438) SpO2:  [97 %-100 %] 97 % (03/06 0438) Weight:  [140 lb (63.504 kg)] 140 lb (63.504 kg) (03/05 2037)  Physical Exam: General -- No distress, laying comfortably in bed HEENT -- Head is normocephalic. Dentition poor. Chest -- Lungs clear to auscultation. Cardiac -- RRR. No murmurs noted Abdomen -- soft, nontender. No masses palpable. Bowel sounds present. CNS -- oriented x3, Psych -- delusions,  Extremeties - ROM good. Nonedematous. RUE with previous AV fistula site. Dorsalis pedis pulses present and symmetrical.   Laboratory:  Recent Labs Lab 06/08/14 1012 06/09/14 1341 06/10/14 1128  WBC 4.3 4.2 3.9*  HGB 8.1* 7.7* 7.9*  HCT 24.5* 23.9* 25.0*  PLT 165 171 226    Recent Labs Lab 06/07/14 1246 06/08/14 1012 06/09/14 0807 06/10/14 1128  NA 140 134* 137 136  K 7.2* 4.6 4.6 3.7  CL 99 96 98 97  CO2 19 26 24 26   BUN 122* 49* 56* 33*  CREATININE 17.48* 10.02* 11.71* 8.69*  CALCIUM 9.7 8.7 9.3 8.3*  PROT 6.6 6.2  --   --   BILITOT 0.7 0.4  --   --   ALKPHOS 55 55  --   --   ALT 18 17  --   --   AST 27 29  --   --   GLUCOSE  73 70 97 109*    Imaging/Diagnostic Tests: No results found.  Sara Idler, MD 06/11/2014, 8:29 AM PGY-2, [place] Intern pager: [phone], text pages welcome

## 2014-06-12 LAB — RENAL FUNCTION PANEL
ANION GAP: 12 (ref 5–15)
Albumin: 2.7 g/dL — ABNORMAL LOW (ref 3.5–5.2)
BUN: 67 mg/dL — AB (ref 6–23)
CO2: 22 mmol/L (ref 19–32)
Calcium: 8.7 mg/dL (ref 8.4–10.5)
Chloride: 99 mmol/L (ref 96–112)
Creatinine, Ser: 11.63 mg/dL — ABNORMAL HIGH (ref 0.50–1.10)
GFR calc non Af Amer: 4 mL/min — ABNORMAL LOW (ref >90)
GFR, EST AFRICAN AMERICAN: 4 mL/min — AB (ref >90)
GLUCOSE: 101 mg/dL — AB (ref 70–99)
POTASSIUM: 4 mmol/L (ref 3.5–5.1)
Phosphorus: 5.3 mg/dL — ABNORMAL HIGH (ref 2.3–4.6)
SODIUM: 133 mmol/L — AB (ref 135–145)

## 2014-06-12 LAB — CBC
HEMATOCRIT: 25.5 % — AB (ref 36.0–46.0)
Hemoglobin: 8.1 g/dL — ABNORMAL LOW (ref 12.0–15.0)
MCH: 27.4 pg (ref 26.0–34.0)
MCHC: 31.8 g/dL (ref 30.0–36.0)
MCV: 86.1 fL (ref 78.0–100.0)
PLATELETS: 314 10*3/uL (ref 150–400)
RBC: 2.96 MIL/uL — ABNORMAL LOW (ref 3.87–5.11)
RDW: 18.8 % — ABNORMAL HIGH (ref 11.5–15.5)
WBC: 7.7 10*3/uL (ref 4.0–10.5)

## 2014-06-12 LAB — IRON AND TIBC
IRON: 92 ug/dL (ref 42–145)
Saturation Ratios: 45 % (ref 20–55)
TIBC: 204 ug/dL — ABNORMAL LOW (ref 250–470)
UIBC: 112 ug/dL — ABNORMAL LOW (ref 125–400)

## 2014-06-12 MED ORDER — HALOPERIDOL 5 MG PO TABS: 5.0000 mg | ORAL_TABLET | Freq: Two times a day (BID) | ORAL | Status: DC

## 2014-06-12 NOTE — Progress Notes (Signed)
Notice: patient's IVC should be coming to completion within the next 24-48hrs. She will need this renewed as soon as tomorrow (3/8) if an inpatient bed is not available by today.  Elberta Leatherwood, MD,MS,  PGY1 06/12/2014 12:20 PM

## 2014-06-12 NOTE — Progress Notes (Signed)
Family Medicine Teaching Service Daily Progress Note Intern Pager: [phone]  Patient name: Sara [name] Medical record number: [id number] Date of birth: [birth year] Age: 34 y.o. Gender: female  Primary Care Provider: Tommi Rumps, MD Consultants: nephrology; psych Code Status: full  Pt Overview and Major Events to Date:  3/2: admitted for hyperkalemia and acute encephalopathy 3/3: attempted to leave hospital; brought back by security. 3/4: Deemed medically stable for DC with HD in place  Assessment and Plan: Sara [name] is a 34 y.o. female presenting with mania/psychosis and hyperkalemia. PMH is significant for lupus, ESRD secondary to lupus nephritis, hypertension, bipolar 1 disorder, schizophrenia, psychosis.  #Acute Encephalopathy: Suspicion for psychosis vs uremia - Psychiatry consulted >> Has seen patient; assessed as qualifying for inpatient psych as long as medically stable >> patient w/ K+ 4.6, Na+ 137 >> deemed medically stable for DC as long as HD is in place. (T/Th/Sat schedule)  - Left message w/ Psych CSW to assess ETD to inpatient psych - 5 mg Haldol, 1 mg Ativan, 1 mg benztropine all PRN every 6 hours for agitation/psychosis - Continue home Haldol 5 mg BID; Hydroxyzine 25mg  QID - Repeat labs in AM - Renal/carb modified diet  #Hyperkalemia: - Resolved. 7.2 on admission. Patient on regular schedule for hemodialysis. Previous note states she is on a T/Th/Sat schedule. Improved with dialysis. - continue to monitor - Currently undergoing HD (3/7)  #ESRD: Tuesday Thursday Saturday schedule at home - Nephrology consulted - patient receiving dialysis, last session 3/4 - Currently undergoing HD (3/7)  #Anemia of CKD: hemoglobin stable 7.9 on 3/5. No evidence of bleed - follow-up CBC  - management per nephrology  #SLE: Patient has long history of confirmed diagnosis - Continue home daily regimen of prednisone  #Hypertension: 143/87 at admission. -  Discontinue home hydralazine 3/4 - Continue home Imdur & metoprolol  #History of substance abuse - per police Marijuana was found on the patient - UDS "needs to be collected"  However, patient making minimal urine  FEN/GI: Renal/carb modified diet;SLIV Prophylaxis: Subcutaneous heparin (patient has been refusing for the past two days), SCDs (patient states she will refuse as they make her feel like she is shackled) Discussed risks for refusing DVT prophylaxis, including DVT and PE.  Disposition: discharge to inpatient psychiatric facility once placement options available; medically stable at this time.  Subjective:  Patient continues to ask if she can go home. States she lives at the [facility] and is concerned she has "lost her bed". I informed her that we were trying to DC her to inpatient psych last week. I chose NOT to remind her of this as to avoid her getting upset and possibly aggressive while undergoing HD. No medical complaints at this time.  Objective: Temp:  [97 F (36.1 C)-98 F (36.7 C)] 97 F (36.1 C) (03/07 0652) Pulse Rate:  [78-102] 90 (03/07 0800) Resp:  [15-20] 18 (03/07 0800) BP: (110-173)/(87-104) 173/96 mmHg (03/07 0800) SpO2:  [100 %] 100 % (03/07 0652) Weight:  [148 lb 2.4 oz (67.2 kg)] 148 lb 2.4 oz (67.2 kg) (03/07 0814)  Physical Exam: General -- No distress, laying comfortably in bed HEENT -- Head is normocephalic. Dentition poor. Chest -- Lungs clear to auscultation. Cardiac -- RRR. No murmurs noted Abdomen -- soft, nontender. No masses palpable. Bowel sounds present. CNS -- oriented x3, Psych -- delusions,  Extremeties - ROM good. Nonedematous. RUE with previous AV fistula site. Dorsalis pedis pulses present and symmetrical.   Laboratory:  Recent  Labs Lab 06/09/14 1341 06/10/14 1128 06/12/14 0701  WBC 4.2 3.9* 7.7  HGB 7.7* 7.9* 8.1*  HCT 23.9* 25.0* 25.5*  PLT 171 226 314    Recent Labs Lab 06/07/14 1246 06/08/14 1012  06/09/14 0807 06/10/14 1128 06/12/14 0701  NA 140 134* 137 136 133*  K 7.2* 4.6 4.6 3.7 4.0  CL 99 96 98 97 99  CO2 19 26 24 26 22   BUN 122* 49* 56* 33* 67*  CREATININE 17.48* 10.02* 11.71* 8.69* 11.63*  CALCIUM 9.7 8.7 9.3 8.3* 8.7  PROT 6.6 6.2  --   --   --   BILITOT 0.7 0.4  --   --   --   ALKPHOS 55 55  --   --   --   ALT 18 17  --   --   --   AST 27 29  --   --   --   GLUCOSE 73 70 97 109* 101*    Imaging/Diagnostic Tests: No results found.  Elberta Leatherwood, MD 06/12/2014, 9:23 AM PGY-1, [place] Intern pager: [phone], text pages welcome
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
## 2014-06-12 NOTE — Clinical Social Work Psych Note (Signed)
Psych CSW to re-new patient Involuntary Commitment Paperwork (IVC) on 06/13/2014.  Psych CSW has received a denial from [facility] due to patient's "combative behaviors".  Psych CSW confirmed with MD that patient has had no behaviors since admission.  Per medical staff report, patient has been verbally aggressive and agitated while on the medical floor.  Psych CSW has requested the assistance of the Medical Director in regards to placement.  Psych CSW has updated unit CSW and MD.  Psych CSW will continue to assist with placement.  Barrier to discharge: Hemodialysis, aggressive behaviors/verbiage   Nonnie Done, LCSWA [phone]  Psychiatric & Orthopedics (5N 1-16) Clinical Social Worker

## 2014-06-12 NOTE — Progress Notes (Signed)
Subjective:   cooperative today, apologizes for behavior last week.   Objective Filed Vitals:   06/12/14 0800 06/12/14 0830 06/12/14 0930 06/12/14 1000  BP: 173/96 149/89 160/87 145/90  Pulse: 90 98 111 123  Temp:      TempSrc:      Resp: 18 20 20 17   Height:      Weight:      SpO2:       Physical Exam General: alert and oriented. No acute distress. Talkative/restless Heart: RRR.  Lungs: CTA unlabored.  Abdomen: soft, nontender +BS Extremities: trace LE edema  Dialysis Access: R AVF patent on HD  Dialysis Orders: TTS 4 hr 400/800 59kgs 2K/2Ca+ R AVF 6000u heparin venofer 50 q week. micera 225 q 2 weeks  Assessment/Plan: 1. Psychosis-  DC to inpt psych/ drug screen negative 2. ESRD - HD now. Hx severe noncompliance. Discharged from outpt center.  3. Anemia - hgb 8.1 cont ESA and Fe 4. Secondary hyperparathyroidism -phos 5.3  Ca+ 8.7 no vit D 5. HTN/volume - 145/90 cont imdur and metop get standing wt post HD Up 5kg by weights, extra HD tomorrow if still here 6. Nutrition - alb 2.7 renal diet. [place], NP [place] [phone] 06/12/2014,10:22 AM  LOS: 5 days   Pt seen, examined and agree w A/P as above.  Kelly Splinter MD pager [phone]    cell [phone] 06/12/2014, 12:21 PM    Additional Objective Labs: Basic Metabolic Panel:  Recent Labs Lab 06/08/14 1012 06/09/14 0807 06/10/14 1128 06/12/14 0701  NA 134* 137 136 133*  K 4.6 4.6 3.7 4.0  CL 96 98 97 99  CO2 26 24 26 22   GLUCOSE 70 97 109* 101*  BUN 49* 56* 33* 67*  CREATININE 10.02* 11.71* 8.69* 11.63*  CALCIUM 8.7 9.3 8.3* 8.7  PHOS 7.1*  --  6.6* 5.3*   Liver Function Tests:  Recent Labs Lab 06/07/14 1246 06/08/14 1012 06/10/14 1128 06/12/14 0701  AST 27 29  --   --   ALT 18 17  --   --   ALKPHOS 55 55  --   --   BILITOT 0.7 0.4  --   --   PROT 6.6 6.2  --   --   ALBUMIN 2.9* 2.7* 2.6* 2.7*   No results for input(s): LIPASE, AMYLASE in the last  168 hours. CBC:  Recent Labs Lab 06/07/14 1246 06/08/14 1012 06/09/14 1341 06/10/14 1128 06/12/14 0701  WBC 5.6 4.3 4.2 3.9* 7.7  HGB 8.2* 8.1* 7.7* 7.9* 8.1*  HCT 25.6* 24.5* 23.9* 25.0* 25.5*  MCV 84.2 81.7 82.1 85.0 86.1  PLT 216 165 171 226 314   Blood Culture    Component Value Date/Time   SDES URINE, CATHETERIZED 02/20/2014 0921   SPECREQUEST NONE 02/20/2014 0921   CULT NO GROWTH Performed at [facility]  02/20/2014 0921   REPTSTATUS 02/21/2014 FINAL 02/20/2014 0921    Cardiac Enzymes: No results for input(s): CKTOTAL, CKMB, CKMBINDEX, TROPONINI in the last 168 hours. CBG: No results for input(s): GLUCAP in the last 168 hours. Iron Studies: No results for input(s): IRON, TIBC, TRANSFERRIN, FERRITIN in the last 72 hours. @lablastinr3 @ Studies/Results: No results found. Medications:   . darbepoetin (ARANESP) injection - DIALYSIS  200 mcg Intravenous Q Wed-HD  . ferric gluconate (FERRLECIT/NULECIT) IV  62.5 mg Intravenous Weekly  . haloperidol  5 mg Oral BID  . heparin  5,000 Units Subcutaneous 3 times per day  . isosorbide  mononitrate  30 mg Oral Daily  . metoprolol tartrate  25 mg Oral BID  . multivitamin  1 tablet Oral QHS  . predniSONE  40 mg Oral Q breakfast

## 2014-06-13 ENCOUNTER — Inpatient Hospital Stay: Payer: Self-pay | Admitting: Psychiatry

## 2014-06-13 LAB — RENAL FUNCTION PANEL
ANION GAP: 12 (ref 5–15)
Albumin: 2.9 g/dL — ABNORMAL LOW (ref 3.5–5.2)
BUN: 43 mg/dL — ABNORMAL HIGH (ref 6–23)
CALCIUM: 9.3 mg/dL (ref 8.4–10.5)
CO2: 25 mmol/L (ref 19–32)
Chloride: 99 mmol/L (ref 96–112)
Creatinine, Ser: 8.38 mg/dL — ABNORMAL HIGH (ref 0.50–1.10)
GFR calc non Af Amer: 6 mL/min — ABNORMAL LOW (ref >90)
GFR, EST AFRICAN AMERICAN: 6 mL/min — AB (ref >90)
Glucose, Bld: 114 mg/dL — ABNORMAL HIGH (ref 70–99)
POTASSIUM: 3.7 mmol/L (ref 3.5–5.1)
Phosphorus: 3.7 mg/dL (ref 2.3–4.6)
Sodium: 136 mmol/L (ref 135–145)

## 2014-06-13 LAB — PTH, INTACT AND CALCIUM
Calcium, Total (PTH): 8.7 mg/dL (ref 8.7–10.2)
PTH: 621 pg/mL — ABNORMAL HIGH (ref 15–65)

## 2014-06-13 MED ORDER — CALCITRIOL 0.5 MCG PO CAPS
0.5000 ug | ORAL_CAPSULE | ORAL | Status: DC
  Filled 2014-06-13: qty 1

## 2014-06-13 NOTE — Progress Notes (Signed)
Patient being reviewed at [facility] for placement.    Chesley Noon, MSW, LCSW, LCAS-A Evening Clinical Social Worker [phone]

## 2014-06-13 NOTE — Progress Notes (Signed)
Discharge instructions and medications discussed with patient. IV removed; catheter intact. Assessment unchanged from morning. Pt discharged to [facility].

## 2014-06-13 NOTE — Progress Notes (Signed)
Attempted to call report again to [facility].  Stated they will call later to get report.

## 2014-06-13 NOTE — Progress Notes (Signed)
Family Medicine Teaching Service Daily Progress Note Intern Pager: [phone]  Patient name: Sara [name] Medical record number: [id number] Date of birth: 08-Jul-1980 Age: 34 y.o. Gender: female  Primary Care Provider: Tommi Rumps, MD Consultants: nephrology; psych Code Status: full  Pt Overview and Major Events to Date:  3/2: admitted for hyperkalemia and acute encephalopathy 3/3: attempted to leave hospital; brought back by security. 3/4: Deemed medically stable for DC with HD in place  Assessment and Plan: Sara [name] is a 34 y.o. female presenting with mania/psychosis and hyperkalemia. PMH is significant for lupus, ESRD secondary to lupus nephritis, hypertension, bipolar 1 disorder, schizophrenia, psychosis.  #Acute Encephalopathy: Suspicion for psychosis vs uremia. Resolved. - Psychiatry consulted >> Has seen patient; assessed as qualifying for inpatient psych as long as medically stable >> patient w/ K+ 4.6, Na+ 137 >> deemed medically stable for DC as long as HD is in place. (T/Th/Sat schedule) -CSW accessing placement for patient (see their note) - [facility] has denied patient due to aggressive behavior; patient no longer having this behavior and is medically stable  -drug screen negative - 5 mg Haldol, 1 mg Ativan, 1 mg benztropine all PRN every 6 hours for agitation/psychosis - Continue home Haldol 5 mg BID; Hydroxyzine 25mg  QID - Renal/carb modified diet -awaiting DC to inpatient psych  #Hyperkalemia: - Resolved. 7.2 on admission. Patient on regular schedule for hemodialysis. Previous note states she is on a T/Th/Sat schedule. Improved with dialysis. - continue to monitor - Will undergo HD again today  #ESRD: Tuesday Thursday Saturday schedule at home - Nephrology consulted - patient receiving dialysis, last session 3/7 - another round today  #Anemia of CKD: hemoglobin stable 7.9 on 3/5. No evidence of bleed. Stable - follow-up CBC  - management per  nephrology  #SLE: Patient has long history of confirmed diagnosis - Continue home daily regimen of prednisone  #Hypertension: 143/87 at admission. - Discontinue home hydralazine 3/4 - Continue home Imdur & metoprolol  #History of substance abuse - per police Marijuana was found on the patient - UDS negative  FEN/GI: Renal/carb modified diet;SLIV Prophylaxis: Subcutaneous heparin (patient has been refusing), SCDs. Discussed risks for refusing DVT prophylaxis, including DVT and PE.  Disposition: discharge to inpatient psychiatric facility once placement options available; medically stable at this time.  Subjective:  Patient continues to ask if she can go home. States she had really bad cramps after HD yesterday. However she is used to this. No medical complaints or concerns this morning. She states that she enjoys walking the halls with her sitter.  Objective: Temp:  [97.5 F (36.4 C)-97.8 F (36.6 C)] 97.6 F (36.4 C) (03/08 0639) Pulse Rate:  [84-123] 84 (03/08 0639) Resp:  [16-20] 16 (03/08 0639) BP: (129-160)/(79-94) 137/94 mmHg (03/08 0639) SpO2:  [100 %] 100 % (03/08 0639) Weight:  [142 lb 10.2 oz (64.7 kg)-142 lb 13.7 oz (64.8 kg)] 142 lb 13.7 oz (64.8 kg) (03/07 2111)  Physical Exam: General -- No distress, sitting up in bed, pleasant. HEENT -- Head is normocephalic. Dentition poor. MMM Chest -- Lungs clear to auscultation. Cardiac -- RRR. No murmurs noted Abdomen -- soft, nontender. No masses palpable. Bowel sounds present. CNS -- oriented x3, no focal deficits appreciated Psych --  mood appropritate Extremeties - ROM good. Nonedematous. RUE with previous AV fistula site. Dorsalis pedis pulses present and symmetrical.   Laboratory:  Recent Labs Lab 06/09/14 1341 06/10/14 1128 06/12/14 0701  WBC 4.2 3.9* 7.7  HGB 7.7* 7.9* 8.1*  HCT 23.9* 25.0* 25.5*  PLT 171 226 314    Recent Labs Lab 06/07/14 1246 06/08/14 1012 06/09/14 0807 06/10/14 1128  06/12/14 0701  NA 140 134* 137 136 133*  K 7.2* 4.6 4.6 3.7 4.0  CL 99 96 98 97 99  CO2 19 26 24 26 22   BUN 122* 49* 56* 33* 67*  CREATININE 17.48* 10.02* 11.71* 8.69* 11.63*  CALCIUM 9.7 8.7 9.3 8.3* 8.7  8.7  PROT 6.6 6.2  --   --   --   BILITOT 0.7 0.4  --   --   --   ALKPHOS 55 55  --   --   --   ALT 18 17  --   --   --   AST 27 29  --   --   --   GLUCOSE 73 70 97 109* 101*    Imaging/Diagnostic Tests: No results found.  Katheren Shams, DO 06/13/2014, 8:36 AM PGY-1, [place] Intern pager: [phone], text pages welcome

## 2014-06-13 NOTE — Progress Notes (Signed)
Subjective:   Pleasant and cooperative. Mother visiting  Objective Filed Vitals:   06/12/14 1021 06/12/14 1103 06/12/14 2111 06/13/14 0639  BP: 140/87 129/79 148/92 137/94  Pulse: 120 93 86 84  Temp: 97.8 F (36.6 C) 97.5 F (36.4 C) 97.8 F (36.6 C) 97.6 F (36.4 C)  TempSrc: Oral Oral Oral Oral  Resp: 20 20 19 16   Height:   5\' 7"  (1.702 m)   Weight: 64.7 kg (142 lb 10.2 oz)  64.8 kg (142 lb 13.7 oz)   SpO2: 100% 100% 100% 100%   Physical Exam General: alert and oriented. No acute distress. talkative Heart: RRR Lungs: CTA, unlabored  Abdomen: soft nontender +BS  Extremities: trace LE edema  Dialysis Access: R AVF +b.t   Dialysis Orders: TTS 4 hr 400/800 59kgs 2K/2Ca+ R AVF 6000u heparin venofer 50 q week. micera 225 q 2 weeks  Assessment/Plan: 1. Psychosis- DC to inpt psych/ drug screen negative 2. ESRD - HD again today. Hx severe noncompliance. Discharged from outpt center.  3. Anemia - hgb 8.1 cont ESA and Fe 4. Secondary hyperparathyroidism -phos 5.3 Ca+ 8.7  PTH 621- start vit D 5. HTN/volume - 137/84 cont imdur and metop- 5 kgs over edw, HD again today for volume  6. Nutrition - alb 2.7 renal diet. Vit- eating well  Shelle Iron, NP [place] [phone] 06/13/2014,9:43 AM  LOS: 6 days   Pt seen, examined and agree w A/P as above.  Kelly Splinter MD pager [phone]    cell [phone] 06/13/2014, 12:50 PM     Additional Objective Labs: Basic Metabolic Panel:  Recent Labs Lab 06/08/14 1012 06/09/14 0807 06/10/14 1128 06/12/14 0701  NA 134* 137 136 133*  K 4.6 4.6 3.7 4.0  CL 96 98 97 99  CO2 26 24 26 22   GLUCOSE 70 97 109* 101*  BUN 49* 56* 33* 67*  CREATININE 10.02* 11.71* 8.69* 11.63*  CALCIUM 8.7 9.3 8.3* 8.7  8.7  PHOS 7.1*  --  6.6* 5.3*   Liver Function Tests:  Recent Labs Lab 06/07/14 1246 06/08/14 1012 06/10/14 1128 06/12/14 0701  AST 27 29  --   --   ALT 18 17  --   --   ALKPHOS 55 55  --    --   BILITOT 0.7 0.4  --   --   PROT 6.6 6.2  --   --   ALBUMIN 2.9* 2.7* 2.6* 2.7*   No results for input(s): LIPASE, AMYLASE in the last 168 hours. CBC:  Recent Labs Lab 06/07/14 1246 06/08/14 1012 06/09/14 1341 06/10/14 1128 06/12/14 0701  WBC 5.6 4.3 4.2 3.9* 7.7  HGB 8.2* 8.1* 7.7* 7.9* 8.1*  HCT 25.6* 24.5* 23.9* 25.0* 25.5*  MCV 84.2 81.7 82.1 85.0 86.1  PLT 216 165 171 226 314   Blood Culture    Component Value Date/Time   SDES URINE, CATHETERIZED 02/20/2014 0921   SPECREQUEST NONE 02/20/2014 0921   CULT NO GROWTH Performed at [facility]  02/20/2014 0921   REPTSTATUS 02/21/2014 FINAL 02/20/2014 0921    Cardiac Enzymes: No results for input(s): CKTOTAL, CKMB, CKMBINDEX, TROPONINI in the last 168 hours. CBG: No results for input(s): GLUCAP in the last 168 hours. Iron Studies:  Recent Labs  06/12/14 0701  IRON 92  TIBC 204*   @lablastinr3 @ Studies/Results: No results found. Medications:   . darbepoetin (ARANESP) injection - DIALYSIS  200 mcg Intravenous Q Wed-HD  . ferric gluconate (FERRLECIT/NULECIT) IV  62.5 mg  Intravenous Weekly  . haloperidol  5 mg Oral BID  . heparin  5,000 Units Subcutaneous 3 times per day  . isosorbide mononitrate  30 mg Oral Daily  . metoprolol tartrate  25 mg Oral BID  . multivitamin  1 tablet Oral QHS  . predniSONE  40 mg Oral Q breakfast

## 2014-06-13 NOTE — Discharge Instructions (Signed)
Discharge to [facility] for inpatient psych treatment.

## 2014-06-13 NOTE — Care Management Note (Signed)
CARE MANAGEMENT NOTE 06/13/2014  Patient:  Sara [name], Sara [name]   Account Number:  [id number]  Date Initiated:  06/09/2014  Documentation initiated by:  MAYO,HENRIETTA  Subjective/Objective Assessment:   dx hyperkalemia/encephalopathy    PCP  Tommi Rumps     Action/Plan:   06/13/2014 Pt improving still very easily agitated and very "busy". Family states that she is at baseline. Hopefully will be ready for d/c in the next 24hr.   Anticipated DC Date:  06/14/2014   Anticipated DC Plan:  HOME/SELF CARE  In-house referral  Clinical Social Worker      DC Planning Services  CM consult      Choice offered to / List presented to:             Status of service:  Completed, signed off Medicare Important Message given?  NO (If response is "NO", the following Medicare IM given date fields will be blank) Date Medicare IM given:   Medicare IM given by:   Date Additional Medicare IM given:   Additional Medicare IM given by:    Discharge Disposition:    Per UR Regulation:  Reviewed for med. necessity/level of care/duration of stay  If discussed at [place] of Stay Meetings, dates discussed:    Comments:  06/09/14 1352 [place] request for inpatient psych placement, referred request to psych social worker.

## 2014-06-13 NOTE — Progress Notes (Signed)
Attempted to call to give report to RN at [facility].  Will call again.
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
## 2014-06-18 ENCOUNTER — Other Ambulatory Visit: Payer: Self-pay | Admitting: Family Medicine

## 2014-06-19 NOTE — Telephone Encounter (Signed)
Prescriptions refilled.

## 2014-06-20 ENCOUNTER — Telehealth: Payer: Self-pay | Admitting: Nurse Practitioner

## 2014-06-20 NOTE — Telephone Encounter (Signed)
Pt has not returned any of the phone calls asking him to call up to reschedule his echo. 06/16/14-lvm to return call to reschedule echo with [email or web address] 06/14/2014-lvm to return call to reschedule echo with [email or web address] 06/13/14-lvm to return call to reschedule echo with [email or web address] 06/06/14-lvm to return call to reschedule echo with [email or web address]

## 2014-06-22 ENCOUNTER — Encounter (HOSPITAL_COMMUNITY): Payer: Self-pay | Admitting: Emergency Medicine

## 2014-06-22 ENCOUNTER — Observation Stay (HOSPITAL_COMMUNITY)
Admission: EM | Admit: 2014-06-22 | Discharge: 2014-06-23 | Disposition: A | Discharge status: Home / Self Care | Payer: Medicare Other | Attending: Internal Medicine | Admitting: Internal Medicine

## 2014-06-22 ENCOUNTER — Emergency Department (HOSPITAL_COMMUNITY): Payer: Medicare Other

## 2014-06-22 DIAGNOSIS — M329 Systemic lupus erythematosus, unspecified: Secondary | ICD-10-CM | POA: Diagnosis not present

## 2014-06-22 DIAGNOSIS — N186 End stage renal disease: Secondary | ICD-10-CM | POA: Insufficient documentation

## 2014-06-22 DIAGNOSIS — Z888 Allergy status to other drugs, medicaments and biological substances status: Secondary | ICD-10-CM | POA: Diagnosis not present

## 2014-06-22 DIAGNOSIS — N189 Chronic kidney disease, unspecified: Secondary | ICD-10-CM

## 2014-06-22 DIAGNOSIS — M3214 Glomerular disease in systemic lupus erythematosus: Secondary | ICD-10-CM | POA: Diagnosis not present

## 2014-06-22 DIAGNOSIS — Z8659 Personal history of other mental and behavioral disorders: Secondary | ICD-10-CM

## 2014-06-22 DIAGNOSIS — F17210 Nicotine dependence, cigarettes, uncomplicated: Secondary | ICD-10-CM | POA: Diagnosis not present

## 2014-06-22 DIAGNOSIS — I120 Hypertensive chronic kidney disease with stage 5 chronic kidney disease or end stage renal disease: Secondary | ICD-10-CM | POA: Diagnosis not present

## 2014-06-22 DIAGNOSIS — Z992 Dependence on renal dialysis: Secondary | ICD-10-CM | POA: Insufficient documentation

## 2014-06-22 DIAGNOSIS — E875 Hyperkalemia: Secondary | ICD-10-CM | POA: Diagnosis present

## 2014-06-22 DIAGNOSIS — J810 Acute pulmonary edema: Secondary | ICD-10-CM

## 2014-06-22 DIAGNOSIS — Z9119 Patient's noncompliance with other medical treatment and regimen: Secondary | ICD-10-CM | POA: Insufficient documentation

## 2014-06-22 DIAGNOSIS — Z79899 Other long term (current) drug therapy: Secondary | ICD-10-CM | POA: Insufficient documentation

## 2014-06-22 DIAGNOSIS — Z881 Allergy status to other antibiotic agents status: Secondary | ICD-10-CM | POA: Diagnosis not present

## 2014-06-22 DIAGNOSIS — D649 Anemia, unspecified: Secondary | ICD-10-CM

## 2014-06-22 DIAGNOSIS — Z720 Tobacco use: Secondary | ICD-10-CM | POA: Diagnosis present

## 2014-06-22 DIAGNOSIS — I509 Heart failure, unspecified: Principal | ICD-10-CM | POA: Insufficient documentation

## 2014-06-22 DIAGNOSIS — E8770 Fluid overload, unspecified: Secondary | ICD-10-CM | POA: Diagnosis present

## 2014-06-22 DIAGNOSIS — F319 Bipolar disorder, unspecified: Secondary | ICD-10-CM | POA: Diagnosis not present

## 2014-06-22 DIAGNOSIS — F209 Schizophrenia, unspecified: Secondary | ICD-10-CM | POA: Diagnosis not present

## 2014-06-22 DIAGNOSIS — I10 Essential (primary) hypertension: Secondary | ICD-10-CM | POA: Diagnosis present
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
## 2014-06-22 NOTE — ED Notes (Signed)
Pt states she was last dialyzed on Monday, but she is visiting a cousin here and lives in [place]. Pt states she has been out of her meds since Tuesday.

## 2014-06-22 NOTE — ED Notes (Signed)
Lab at bedside

## 2014-06-22 NOTE — ED Notes (Signed)
MD at bedside. 

## 2014-06-23 ENCOUNTER — Encounter (HOSPITAL_COMMUNITY): Payer: Self-pay | Admitting: Internal Medicine

## 2014-06-23 DIAGNOSIS — E875 Hyperkalemia: Secondary | ICD-10-CM

## 2014-06-23 DIAGNOSIS — I10 Essential (primary) hypertension: Secondary | ICD-10-CM | POA: Diagnosis not present

## 2014-06-23 DIAGNOSIS — N186 End stage renal disease: Secondary | ICD-10-CM | POA: Diagnosis not present

## 2014-06-23 DIAGNOSIS — Z992 Dependence on renal dialysis: Secondary | ICD-10-CM

## 2014-06-23 DIAGNOSIS — I509 Heart failure, unspecified: Secondary | ICD-10-CM | POA: Diagnosis not present

## 2014-06-23 DIAGNOSIS — M3214 Glomerular disease in systemic lupus erythematosus: Secondary | ICD-10-CM | POA: Diagnosis not present

## 2014-06-23 DIAGNOSIS — Z8659 Personal history of other mental and behavioral disorders: Secondary | ICD-10-CM

## 2014-06-23 LAB — CBC WITH DIFFERENTIAL/PLATELET
BASOS PCT: 0 % (ref 0–1)
Basophils Absolute: 0 10*3/uL (ref 0.0–0.1)
EOS ABS: 0.2 10*3/uL (ref 0.0–0.7)
EOS PCT: 4 % (ref 0–5)
HCT: 24.2 % — ABNORMAL LOW (ref 36.0–46.0)
Hemoglobin: 7.7 g/dL — ABNORMAL LOW (ref 12.0–15.0)
LYMPHS ABS: 1.1 10*3/uL (ref 0.7–4.0)
Lymphocytes Relative: 23 % (ref 12–46)
MCH: 28.2 pg (ref 26.0–34.0)
MCHC: 31.8 g/dL (ref 30.0–36.0)
MCV: 88.6 fL (ref 78.0–100.0)
Monocytes Absolute: 0.6 10*3/uL (ref 0.1–1.0)
Monocytes Relative: 12 % (ref 3–12)
Neutro Abs: 2.9 10*3/uL (ref 1.7–7.7)
Neutrophils Relative %: 61 % (ref 43–77)
Platelets: 249 10*3/uL (ref 150–400)
RBC: 2.73 MIL/uL — AB (ref 3.87–5.11)
RDW: 20.9 % — ABNORMAL HIGH (ref 11.5–15.5)
WBC: 4.7 10*3/uL (ref 4.0–10.5)

## 2014-06-23 LAB — BASIC METABOLIC PANEL
Anion gap: 12 (ref 5–15)
BUN: 87 mg/dL — ABNORMAL HIGH (ref 6–23)
CALCIUM: 8.7 mg/dL (ref 8.4–10.5)
CHLORIDE: 103 mmol/L (ref 96–112)
CO2: 25 mmol/L (ref 19–32)
Creatinine, Ser: 12.08 mg/dL — ABNORMAL HIGH (ref 0.50–1.10)
GFR calc Af Amer: 4 mL/min — ABNORMAL LOW (ref >90)
GFR calc non Af Amer: 4 mL/min — ABNORMAL LOW (ref >90)
GLUCOSE: 100 mg/dL — AB (ref 70–99)
POTASSIUM: 6 mmol/L — AB (ref 3.5–5.1)
Sodium: 140 mmol/L (ref 135–145)

## 2014-06-23 MED ORDER — NEPRO/CARBSTEADY PO LIQD: 237.0000 mL | ORAL | Status: DC | PRN

## 2014-06-23 MED ORDER — ACETAMINOPHEN 325 MG PO TABS
650.0000 mg | ORAL_TABLET | Freq: Four times a day (QID) | ORAL | Status: DC | PRN
  Administered 2014-06-23: 650 mg via ORAL
  Filled 2014-06-23: qty 2

## 2014-06-23 MED ORDER — CALCIUM ACETATE (PHOS BINDER) 667 MG PO CAPS
2001.0000 mg | ORAL_CAPSULE | Freq: Three times a day (TID) | ORAL | Status: DC
Start: 2014-06-23 — End: 2014-06-23
  Administered 2014-06-23: 2001 mg via ORAL
  Filled 2014-06-23: qty 3

## 2014-06-23 MED ORDER — PENTAFLUOROPROP-TETRAFLUOROETH EX AERO
1.0000 "application " | INHALATION_SPRAY | CUTANEOUS | Status: DC | PRN
  Filled 2014-06-23: qty 30

## 2014-06-23 MED ORDER — METOPROLOL TARTRATE 25 MG PO TABS
25.0000 mg | ORAL_TABLET | Freq: Two times a day (BID) | ORAL | Status: DC
  Administered 2014-06-23: 25 mg via ORAL
  Filled 2014-06-23: qty 1

## 2014-06-23 MED ORDER — CALCIUM ACETATE (PHOS BINDER) 667 MG PO CAPS: 667.0000 mg | ORAL_CAPSULE | Freq: Every day | ORAL | Status: DC | PRN

## 2014-06-23 MED ORDER — FUROSEMIDE 10 MG/ML IJ SOLN
80.0000 mg | Freq: Once | INTRAMUSCULAR | Status: AC
  Administered 2014-06-23: 80 mg via INTRAVENOUS
  Filled 2014-06-23: qty 8

## 2014-06-23 MED ORDER — ISOSORBIDE MONONITRATE ER 30 MG PO TB24
30.0000 mg | ORAL_TABLET | Freq: Every day | ORAL | Status: DC
  Administered 2014-06-23: 30 mg via ORAL
  Filled 2014-06-23: qty 1

## 2014-06-23 MED ORDER — SODIUM POLYSTYRENE SULFONATE 15 GM/60ML PO SUSP
15.0000 g | Freq: Once | ORAL | Status: AC
  Administered 2014-06-23: 15 g via ORAL
  Filled 2014-06-23: qty 60

## 2014-06-23 MED ORDER — SODIUM CHLORIDE 0.9 % IV SOLN: 100.0000 mL | INTRAVENOUS | Status: DC | PRN

## 2014-06-23 MED ORDER — LIDOCAINE-PRILOCAINE 2.5-2.5 % EX CREA
1.0000 "application " | TOPICAL_CREAM | CUTANEOUS | Status: DC | PRN
  Filled 2014-06-23: qty 5

## 2014-06-23 MED ORDER — ALTEPLASE 2 MG IJ SOLR
2.0000 mg | Freq: Once | INTRAMUSCULAR | Status: DC | PRN
  Filled 2014-06-23: qty 2

## 2014-06-23 MED ORDER — HALOPERIDOL 2 MG PO TABS
5.0000 mg | ORAL_TABLET | Freq: Two times a day (BID) | ORAL | Status: DC
  Administered 2014-06-23: 5 mg via ORAL
  Filled 2014-06-23: qty 3

## 2014-06-23 MED ORDER — INSULIN ASPART 100 UNIT/ML ~~LOC~~ SOLN
SUBCUTANEOUS | Status: AC
  Administered 2014-06-23: 10 [IU] via INTRAVENOUS
  Filled 2014-06-23: qty 1

## 2014-06-23 MED ORDER — INSULIN ASPART 100 UNIT/ML IV SOLN
10.0000 [IU] | Freq: Once | INTRAVENOUS | Status: AC
Start: 2014-06-23 — End: 2014-06-23
  Administered 2014-06-23: 10 [IU] via INTRAVENOUS

## 2014-06-23 MED ORDER — DEXTROSE 50 % IV SOLN
INTRAVENOUS | Status: AC
  Administered 2014-06-23: 50 mL via INTRAVENOUS
  Filled 2014-06-23: qty 50

## 2014-06-23 MED ORDER — LIDOCAINE HCL (PF) 1 % IJ SOLN: 5.0000 mL | INTRAMUSCULAR | Status: DC | PRN

## 2014-06-23 MED ORDER — HEPARIN SODIUM (PORCINE) 1000 UNIT/ML DIALYSIS
1000.0000 [IU] | INTRAMUSCULAR | Status: DC | PRN
  Filled 2014-06-23: qty 1

## 2014-06-23 MED ORDER — MORPHINE SULFATE 4 MG/ML IJ SOLN
4.0000 mg | Freq: Once | INTRAMUSCULAR | Status: AC
Start: 2014-06-23 — End: 2014-06-23
  Administered 2014-06-23: 4 mg via INTRAVENOUS
  Filled 2014-06-23: qty 1

## 2014-06-23 MED ORDER — SODIUM CHLORIDE 0.9 % IJ SOLN: 3.0000 mL | Freq: Two times a day (BID) | INTRAMUSCULAR | Status: DC

## 2014-06-23 MED ORDER — HEPARIN SODIUM (PORCINE) 5000 UNIT/ML IJ SOLN: 5000.0000 [IU] | Freq: Three times a day (TID) | INTRAMUSCULAR | Status: DC

## 2014-06-23 MED ORDER — NITROGLYCERIN 2 % TD OINT
1.0000 [in_us] | TOPICAL_OINTMENT | Freq: Once | TRANSDERMAL | Status: AC
  Administered 2014-06-23: 1 [in_us] via TOPICAL
  Filled 2014-06-23: qty 1

## 2014-06-23 MED ORDER — HYDRALAZINE HCL 25 MG PO TABS
25.0000 mg | ORAL_TABLET | Freq: Three times a day (TID) | ORAL | Status: DC
  Administered 2014-06-23 (×2): 25 mg via ORAL
  Filled 2014-06-23 (×2): qty 1

## 2014-06-23 MED ORDER — HYDRALAZINE HCL 20 MG/ML IJ SOLN: 10.0000 mg | Freq: Once | INTRAMUSCULAR | Status: DC

## 2014-06-23 MED ORDER — DEXTROSE 50 % IV SOLN
1.0000 | Freq: Once | INTRAVENOUS | Status: AC
  Administered 2014-06-23: 50 mL via INTRAVENOUS
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
## 2014-06-23 NOTE — Care Management Note (Signed)
    Page 1 of 1   06/23/2014     1:59:14 PM CARE MANAGEMENT NOTE 06/23/2014  Patient:  Sara [name], Sara [name]   Account Number:  [id number]  Date Initiated:  06/23/2014  Documentation initiated by:  Jolene Provost  Subjective/Objective Assessment:   Pt is from home, independent at baseline. Pt has no HH services or DME's prior to admission. Pt lives in [place] but currently staying with friend in [place]. Pt attempted to set up HD at [facility] in [place] but was unsuccessful and needed HD.     Action/Plan:   Pt plans to discharge home, going back to  and will resume HD services at her previous facility. No CM needs. Pt plans to dsicharge after receiving HD today.   Anticipated DC Date:  06/23/2014   Anticipated DC Plan:  HOME/SELF CARE  In-house referral  Clinical Social Worker      DC Planning Services  CM consult      Choice offered to / List presented to:             Status of service:  Completed, signed off Medicare Important Message given?   (If response is "NO", the following Medicare IM given date fields will be blank) Date Medicare IM given:   Medicare IM given by:   Date Additional Medicare IM given:   Additional Medicare IM given by:    Discharge Disposition:  HOME/SELF CARE  Per UR Regulation:    If discussed at Long Length of Stay Meetings, dates discussed:    Comments:  06/23/2014 [place], RN, MSN, CM

## 2014-06-23 NOTE — ED Notes (Signed)
Pt c/o "knot" to the right buttocks. States it is painful to touch. Hard bump felt. Not raised. No abrasion or skin laceration noted.

## 2014-06-23 NOTE — Progress Notes (Signed)
Pt HD tx ended 1.5 hours early d/t clot in system. Pt. Declined restart of tx. Being d/c from hospital.

## 2014-06-23 NOTE — Progress Notes (Signed)
UR completed 
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
## 2014-06-23 NOTE — Discharge Summary (Addendum)
Physician Discharge Summary  Sara [name] OVZ:[id number] DOB: [birth year] DOA: 06/22/2014  PCP: Sara Rumps, MD  Admit date: 06/22/2014 Discharge date: 06/23/2014  [name]  Time spent: 20 minutes  Recommendations for Outpatient Follow-up:  1. Follow up with scheduled HD as tolerated 2. Follow up with PCP in 1-2 weeks  Discharge Diagnoses:  Principal Problem:   Volume overload Active Problems:   Lupus nephritis   End stage renal disease on dialysis   Schizophrenia   Tobacco abuse   History of bipolar disorder   Hypertension   Hyperkalemia   Discharge Condition: Stable  Diet recommendation: Renal, heart healthy  Filed Weights   06/22/14 2243 06/23/14 0530 06/23/14 1130  Weight: 67.586 kg (149 lb) 84.4 kg (186 lb 1.1 oz) 84.5 kg (186 lb 4.6 oz)    History of present illness:  Please see admit h and p from 3/18 for details. Briefly, pt presents to the ED with complaints of SOB after missing scheduled HD session for 3/16. The patient was admitted for further work up.  Hospital Course:  The patient was admitted to the floor. Nephrology was consulted and the patient underwent HD on 3/18. She remained medically stable and tolerated HD without reported issues. The patient is to follow closely with her PCP in and scheduled HD.   Of note, the patient's BP was noted to be poorly controlled. Pt did not take bp meds on 3/18 secondary to going to dialysis. She required IV hydralazine. Before blood pressures could be rechecked, however, pt elected to leave hospital against medical [name].  Procedures: HD  Consultations:  Nephrology  Discharge Exam: Filed Vitals:   06/23/14 1315 06/23/14 1330 06/23/14 1345 06/23/14 1400  BP: 177/126 159/97 166/109 156/103  Pulse: 106 104 102 102  Temp:      TempSrc:      Resp: 20 23 21 14   Height:      Weight:      SpO2:        General: awake, in nad Cardiovascular: regular, s1,  s2 Respiratory: normal resp effort, no wheezing  Discharge Instructions     Medication List    TAKE these medications        acetaminophen 500 MG tablet  Commonly known as:  TYLENOL  Take 1,000 mg by mouth every 6 (six) hours as needed for moderate pain.     calcium acetate 667 MG capsule  Commonly known as:  PHOSLO  Take 667-2,001 mg by mouth 4 (four) times daily. Take 2001 mg with breakfast, lunch and dinner, and 667 mg with a snack.     clobetasol cream 0.05 %  Commonly known as:  TEMOVATE  Apply 1 application topically 2 (two) times daily as needed (rash).     colchicine 0.6 MG tablet  Take 1 tablet (0.6 mg total) by mouth every other day.     haloperidol 5 MG tablet  Commonly known as:  HALDOL  Take 1 tablet (5 mg total) by mouth 2 (two) times daily.     hydrALAZINE 25 MG tablet  Commonly known as:  APRESOLINE  TAKE 1 TABLET (25 MG TOTAL) BY MOUTH 3 (THREE) TIMES DAILY.     ibuprofen 200 MG tablet  Commonly known as:  ADVIL  Take 1 tablet (200 mg total) by mouth 3 (three) times daily.     isosorbide mononitrate 30 MG 24 hr tablet  Commonly known as:  IMDUR  TAKE 1 TABLET (30 MG TOTAL) BY MOUTH  DAILY.     metoprolol tartrate 25 MG tablet  Commonly known as:  LOPRESSOR  TAKE 1 TABLET BY MOUTH TWICE A DAY       Allergies  Allergen Reactions  . Ativan [Lorazepam] Other (See Comments)    Dysarthria(patient has difficulty speaking and slurred speech); denies swelling, itching, pain, or numbness.  [name] [Ziprasidone Hcl] Itching and Swelling    Tongue swelling  . Keflex [Cephalexin] Swelling    Tongue swelling  . Oatmeal Swelling    Tongue swelling  . Other Itching and Other (See Comments)    Wool causes itching   Follow-up Information    Follow up with Sara Rumps, MD. Schedule an appointment as soon as possible for a visit in 1 week.   Specialty:  Family Medicine   Why:  Hospital follow up   Contact information:   [place] [address] [phone]        The results of significant diagnostics from this hospitalization (including imaging, microbiology, ancillary and laboratory) are listed below for reference.    Significant Diagnostic Studies: Dg Chest Portable 1 View  06/23/2014   CLINICAL DATA:  Dyspnea  EXAM: PORTABLE CHEST - 1 VIEW  COMPARISON:  05/23/2014  FINDINGS: There is unchanged cardiomegaly. There are bilateral pleural effusions. There is mild vascular and interstitial congestion. No focal airspace consolidation is evident, but there is mild ground-glass opacity in the central base regions which could represent alveolar edema.  IMPRESSION: Congestive heart failure or fluid overload, with bilateral effusions and moderate interstitial/alveolar edema. Unchanged cardiomegaly.   Electronically Signed   By: Andreas Newport M.D.   On: 06/23/2014 00:31    Microbiology: No results found for this or any previous visit (from the past 240 hour(s)).   Labs: Basic Metabolic Panel:  Recent Labs Lab 06/22/14 2354  NA 140  K 6.0*  CL 103  CO2 25  GLUCOSE 100*  BUN 87*  CREATININE 12.08*  CALCIUM 8.7   Liver Function Tests: No results for input(s): AST, ALT, ALKPHOS, BILITOT, PROT, ALBUMIN in the last 168 hours. No results for input(s): LIPASE, AMYLASE in the last 168 hours. No results for input(s): AMMONIA in the last 168 hours. CBC:  Recent Labs Lab 06/22/14 2354  WBC 4.7  NEUTROABS 2.9  HGB 7.7*  HCT 24.2*  MCV 88.6  PLT 249   Cardiac Enzymes: No results for input(s): CKTOTAL, CKMB, CKMBINDEX, TROPONINI in the last 168 hours. BNP: BNP (last 3 results)  Recent Labs  05/06/14 2323  BNP 2281.3*    ProBNP (last 3 results) No results for input(s): PROBNP in the last 8760 hours.  CBG: No results for input(s): GLUCAP in the last 168 hours.  Signed:  Kameshia [name] K  [facility] 06/23/2014, 2:29 PM

## 2014-06-23 NOTE — Progress Notes (Signed)
Pt was unable to wait for discharge instructions. She signed out [facility]

## 2014-06-23 NOTE — Progress Notes (Signed)
Patient finished dialysis and stated she was ready to leave. Discharge order in, but patients BP 168/105. Dr. Wyline Copas notified. Lopressor, Hydralazine, and Imdur given as ordered. Patient told we needed to recheck BP in 1 hour before discharging. Patient refused to stay. Signed out against medical advice. Patient removed her own IV and her own vital sign leads.
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
## 2014-06-23 NOTE — ED Notes (Signed)
Hospitalist at bedside 

## 2014-06-25 ENCOUNTER — Observation Stay (HOSPITAL_COMMUNITY): Payer: Medicare Other

## 2014-06-25 ENCOUNTER — Inpatient Hospital Stay (HOSPITAL_COMMUNITY)
Admission: AD | Admit: 2014-06-25 | Discharge: 2014-06-26 | DRG: 682 | Discharge status: Against Medical Advice | Payer: Medicare Other | Source: Other Acute Inpatient Hospital | Attending: Family Medicine | Admitting: Family Medicine

## 2014-06-25 DIAGNOSIS — Z79899 Other long term (current) drug therapy: Secondary | ICD-10-CM | POA: Diagnosis not present

## 2014-06-25 DIAGNOSIS — R000 Tachycardia, unspecified: Secondary | ICD-10-CM | POA: Insufficient documentation

## 2014-06-25 DIAGNOSIS — F1290 Cannabis use, unspecified, uncomplicated: Secondary | ICD-10-CM | POA: Diagnosis not present

## 2014-06-25 DIAGNOSIS — E875 Hyperkalemia: Secondary | ICD-10-CM | POA: Diagnosis present

## 2014-06-25 DIAGNOSIS — D638 Anemia in other chronic diseases classified elsewhere: Secondary | ICD-10-CM | POA: Diagnosis present

## 2014-06-25 DIAGNOSIS — I120 Hypertensive chronic kidney disease with stage 5 chronic kidney disease or end stage renal disease: Principal | ICD-10-CM | POA: Diagnosis present

## 2014-06-25 DIAGNOSIS — F29 Unspecified psychosis not due to a substance or known physiological condition: Secondary | ICD-10-CM | POA: Diagnosis present

## 2014-06-25 DIAGNOSIS — N186 End stage renal disease: Secondary | ICD-10-CM | POA: Diagnosis present

## 2014-06-25 DIAGNOSIS — Z992 Dependence on renal dialysis: Secondary | ICD-10-CM | POA: Diagnosis not present

## 2014-06-25 DIAGNOSIS — Z91048 Other nonmedicinal substance allergy status: Secondary | ICD-10-CM

## 2014-06-25 DIAGNOSIS — N2581 Secondary hyperparathyroidism of renal origin: Secondary | ICD-10-CM | POA: Diagnosis not present

## 2014-06-25 DIAGNOSIS — F319 Bipolar disorder, unspecified: Secondary | ICD-10-CM | POA: Diagnosis present

## 2014-06-25 DIAGNOSIS — Z888 Allergy status to other drugs, medicaments and biological substances status: Secondary | ICD-10-CM

## 2014-06-25 DIAGNOSIS — R0600 Dyspnea, unspecified: Secondary | ICD-10-CM | POA: Diagnosis present

## 2014-06-25 DIAGNOSIS — E8770 Fluid overload, unspecified: Secondary | ICD-10-CM | POA: Diagnosis present

## 2014-06-25 DIAGNOSIS — I10 Essential (primary) hypertension: Secondary | ICD-10-CM | POA: Diagnosis present

## 2014-06-25 DIAGNOSIS — R079 Chest pain, unspecified: Secondary | ICD-10-CM | POA: Diagnosis present

## 2014-06-25 DIAGNOSIS — R7989 Other specified abnormal findings of blood chemistry: Secondary | ICD-10-CM | POA: Diagnosis present

## 2014-06-25 DIAGNOSIS — F17210 Nicotine dependence, cigarettes, uncomplicated: Secondary | ICD-10-CM | POA: Diagnosis present

## 2014-06-25 DIAGNOSIS — E8779 Other fluid overload: Secondary | ICD-10-CM

## 2014-06-25 DIAGNOSIS — Z881 Allergy status to other antibiotic agents status: Secondary | ICD-10-CM | POA: Diagnosis not present

## 2014-06-25 DIAGNOSIS — F259 Schizoaffective disorder, unspecified: Secondary | ICD-10-CM | POA: Diagnosis present

## 2014-06-25 DIAGNOSIS — A6000 Herpesviral infection of urogenital system, unspecified: Secondary | ICD-10-CM | POA: Diagnosis not present

## 2014-06-25 DIAGNOSIS — Z91018 Allergy to other foods: Secondary | ICD-10-CM | POA: Diagnosis not present

## 2014-06-25 DIAGNOSIS — M3214 Glomerular disease in systemic lupus erythematosus: Secondary | ICD-10-CM | POA: Diagnosis not present

## 2014-06-25 DIAGNOSIS — F209 Schizophrenia, unspecified: Secondary | ICD-10-CM | POA: Diagnosis present

## 2014-06-25 DIAGNOSIS — Z21 Asymptomatic human immunodeficiency virus [HIV] infection status: Secondary | ICD-10-CM | POA: Diagnosis present

## 2014-06-25 DIAGNOSIS — Z9119 Patient's noncompliance with other medical treatment and regimen: Secondary | ICD-10-CM | POA: Diagnosis present

## 2014-06-25 LAB — TROPONIN I
TROPONIN I: 0.06 ng/mL — AB (ref <0.031)
Troponin I: 0.05 ng/mL — ABNORMAL HIGH (ref <0.031)

## 2014-06-25 LAB — CBC
HEMATOCRIT: 25.1 % — AB (ref 36.0–46.0)
Hemoglobin: 8.1 g/dL — ABNORMAL LOW (ref 12.0–15.0)
MCH: 27.6 pg (ref 26.0–34.0)
MCHC: 32.3 g/dL (ref 30.0–36.0)
MCV: 85.4 fL (ref 78.0–100.0)
Platelets: 217 10*3/uL (ref 150–400)
RBC: 2.94 MIL/uL — AB (ref 3.87–5.11)
RDW: 21.4 % — ABNORMAL HIGH (ref 11.5–15.5)
WBC: 5.3 10*3/uL (ref 4.0–10.5)

## 2014-06-25 LAB — CREATININE, SERUM
CREATININE: 11.69 mg/dL — AB (ref 0.50–1.10)
GFR calc Af Amer: 4 mL/min — ABNORMAL LOW (ref >90)
GFR calc non Af Amer: 4 mL/min — ABNORMAL LOW (ref >90)

## 2014-06-25 MED ORDER — SODIUM CHLORIDE 0.9 % IV SOLN: 250.0000 mL | INTRAVENOUS | Status: DC | PRN

## 2014-06-25 MED ORDER — HYDRALAZINE HCL 20 MG/ML IJ SOLN
5.0000 mg | INTRAMUSCULAR | Status: DC | PRN
  Administered 2014-06-25 – 2014-06-26 (×2): 5 mg via INTRAVENOUS
  Filled 2014-06-25 (×2): qty 1

## 2014-06-25 MED ORDER — METOPROLOL TARTRATE 25 MG PO TABS
25.0000 mg | ORAL_TABLET | Freq: Two times a day (BID) | ORAL | Status: DC
  Administered 2014-06-25 – 2014-06-26 (×3): 25 mg via ORAL
  Filled 2014-06-25 (×4): qty 1

## 2014-06-25 MED ORDER — SODIUM CHLORIDE 0.9 % IJ SOLN
3.0000 mL | Freq: Two times a day (BID) | INTRAMUSCULAR | Status: DC
  Administered 2014-06-25 (×2): 3 mL via INTRAVENOUS

## 2014-06-25 MED ORDER — HYDRALAZINE HCL 25 MG PO TABS
25.0000 mg | ORAL_TABLET | Freq: Three times a day (TID) | ORAL | Status: DC
  Administered 2014-06-25 – 2014-06-26 (×4): 25 mg via ORAL
  Filled 2014-06-25 (×7): qty 1

## 2014-06-25 MED ORDER — CALCIUM ACETATE (PHOS BINDER) 667 MG PO CAPS
667.0000 mg | ORAL_CAPSULE | Freq: Two times a day (BID) | ORAL | Status: DC | PRN
  Filled 2014-06-25: qty 1

## 2014-06-25 MED ORDER — COLCHICINE 0.6 MG PO TABS: 0.3000 mg | ORAL_TABLET | ORAL | Status: DC

## 2014-06-25 MED ORDER — MIDAZOLAM HCL 2 MG/2ML IJ SOLN
0.5000 mg | INTRAMUSCULAR | Status: DC | PRN
  Administered 2014-06-25 – 2014-06-26 (×2): 0.5 mg via INTRAVENOUS
  Filled 2014-06-25 (×2): qty 2

## 2014-06-25 MED ORDER — HEPARIN SODIUM (PORCINE) 5000 UNIT/ML IJ SOLN
5000.0000 [IU] | Freq: Three times a day (TID) | INTRAMUSCULAR | Status: DC
  Filled 2014-06-25 (×3): qty 1

## 2014-06-25 MED ORDER — ACETAMINOPHEN 500 MG PO TABS: 1000.0000 mg | ORAL_TABLET | Freq: Four times a day (QID) | ORAL | Status: DC | PRN

## 2014-06-25 MED ORDER — SODIUM CHLORIDE 0.9 % IJ SOLN: 3.0000 mL | INTRAMUSCULAR | Status: DC | PRN

## 2014-06-25 MED ORDER — COLCHICINE 0.6 MG PO TABS
0.6000 mg | ORAL_TABLET | ORAL | Status: DC
  Administered 2014-06-25: 0.6 mg via ORAL
  Filled 2014-06-25: qty 1

## 2014-06-25 MED ORDER — HALOPERIDOL 5 MG PO TABS
5.0000 mg | ORAL_TABLET | Freq: Two times a day (BID) | ORAL | Status: DC
  Administered 2014-06-25 – 2014-06-26 (×3): 5 mg via ORAL
  Filled 2014-06-25 (×4): qty 1

## 2014-06-25 MED ORDER — CALCIUM ACETATE (PHOS BINDER) 667 MG PO CAPS
2001.0000 mg | ORAL_CAPSULE | Freq: Three times a day (TID) | ORAL | Status: DC
  Administered 2014-06-25 (×3): 2001 mg via ORAL
  Administered 2014-06-26: 1334 mg via ORAL
  Filled 2014-06-25 (×5): qty 3

## 2014-06-25 MED ORDER — IOHEXOL 350 MG/ML SOLN
80.0000 mL | Freq: Once | INTRAVENOUS | Status: AC | PRN
  Administered 2014-06-25: 80 mL via INTRAVENOUS

## 2014-06-25 MED ORDER — SODIUM POLYSTYRENE SULFONATE 15 GM/60ML PO SUSP
50.0000 g | Freq: Once | ORAL | Status: AC
Start: 2014-06-25 — End: 2014-06-25
  Administered 2014-06-25: 50 g via ORAL
  Filled 2014-06-25: qty 240

## 2014-06-25 MED ORDER — RENA-VITE PO TABS
1.0000 | ORAL_TABLET | Freq: Every day | ORAL | Status: DC
  Administered 2014-06-25: 1 via ORAL
  Filled 2014-06-25 (×2): qty 1

## 2014-06-25 MED ORDER — ISOSORBIDE MONONITRATE ER 30 MG PO TB24
30.0000 mg | ORAL_TABLET | Freq: Every day | ORAL | Status: DC
  Administered 2014-06-25 – 2014-06-26 (×2): 30 mg via ORAL
  Filled 2014-06-25 (×2): qty 1

## 2014-06-25 NOTE — Progress Notes (Signed)
UR completed 

## 2014-06-25 NOTE — Progress Notes (Signed)
Dr Gerarda Fraction paged re increasing anxiety and aggitation.

## 2014-06-25 NOTE — Progress Notes (Signed)
06/25/2014 2:46 PM  This is a late entry.  Upon assessment this am, pt noted to be breathing normally without any respiratory distress or increased WOB noted. Pt returned to sleep after my assessment.  Later in the morning, primary MD rounded and noted her to be dyspneic, however, saturations remained WNL on RA.  Orders noted at 1045 to  Transfer to SDU when bed available.  Renal was notified that patient was being transferred to SDU due to increased WOB and for closer monitoring.  At this time Renal MD noted that patient was not in emergent need for HD and will remain on the schedule for in the am.  Pt vitals remained stable, except for tachypnea and increased BP.  Communication was maintained throughout the day with renal and with primary MD on stability of patient/need for HD/need for stepdown bed.  Rapid response RN was notified this am and has rounded on patient--states that patient will remain on the list for SDU however there are several more critical patients ahead of her.  This was relayed to the MD on call.  See vitals flowsheet for vitals checks.  At this time, pt remains dyspneic (although this appears to have resolved slightly) mainly on exertion.  At rest she appears to breathe normally.  Saturations have remained in the upper 90s on RA.  Pt denies SOB at this time.  Will await stepdown bed availability and continue to monitor patient accordingly. Princella Pellegrini

## 2014-06-25 NOTE — Progress Notes (Signed)
06/25/2014 4:41 PM  Received a phone call from central telemetry stating that all leads were off of the patient.  Upon entering the room to assess the patient and fix the leads, I noticed that the patient was nowhere to be found.  A few moments later, I called central telemetry to inform them that the patient must have gone off of the floor without notifying me, to which they replied that she was showing back up now so she must have travelled back within range.  Security was called to track down the patient.  A few minutes later, [name] showed back up onto the unit, stating she had just went outside to sit on the bench and get some air.  I informed her that she had orders for stepdown (which we had already discussed several times today) and that we were monitoring her closely until a bed became available and that she didn't need to go off of the floor.  She verbalized understanding and asked if she could at least walk the halls of the unit.  I informed her she was welcome to walk the halls as long as she wasn't SOB.  At this time the patient is breathing normally, no signs of respiratory distress, oxygen saturations within normal limits.  Will monitor. Sara [name]
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
## 2014-06-25 NOTE — Progress Notes (Signed)
06/25/2014 6:37 PM  Report called to [place] on [place].  Pt transferring per order to 2C08.  Princella Pellegrini

## 2014-06-25 NOTE — Progress Notes (Signed)
Rec'd return call from Dr Gerarda Fraction. Made aware of increasing anxiety and aggitation. HR 120-130 RR 30-34 B/P 176/125 Poor response to earlier Apresoline. New orders received. Will continue to monitor closely.
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
## 2014-06-26 DIAGNOSIS — F17210 Nicotine dependence, cigarettes, uncomplicated: Secondary | ICD-10-CM | POA: Diagnosis present

## 2014-06-26 DIAGNOSIS — A6000 Herpesviral infection of urogenital system, unspecified: Secondary | ICD-10-CM | POA: Diagnosis present

## 2014-06-26 DIAGNOSIS — R0600 Dyspnea, unspecified: Secondary | ICD-10-CM

## 2014-06-26 DIAGNOSIS — N186 End stage renal disease: Secondary | ICD-10-CM | POA: Diagnosis present

## 2014-06-26 DIAGNOSIS — I120 Hypertensive chronic kidney disease with stage 5 chronic kidney disease or end stage renal disease: Secondary | ICD-10-CM | POA: Diagnosis present

## 2014-06-26 DIAGNOSIS — M3214 Glomerular disease in systemic lupus erythematosus: Secondary | ICD-10-CM | POA: Diagnosis present

## 2014-06-26 DIAGNOSIS — F319 Bipolar disorder, unspecified: Secondary | ICD-10-CM | POA: Diagnosis present

## 2014-06-26 DIAGNOSIS — Z992 Dependence on renal dialysis: Secondary | ICD-10-CM | POA: Diagnosis not present

## 2014-06-26 DIAGNOSIS — E875 Hyperkalemia: Secondary | ICD-10-CM

## 2014-06-26 DIAGNOSIS — Z9119 Patient's noncompliance with other medical treatment and regimen: Secondary | ICD-10-CM | POA: Diagnosis present

## 2014-06-26 DIAGNOSIS — Z888 Allergy status to other drugs, medicaments and biological substances status: Secondary | ICD-10-CM | POA: Diagnosis not present

## 2014-06-26 DIAGNOSIS — I10 Essential (primary) hypertension: Secondary | ICD-10-CM

## 2014-06-26 DIAGNOSIS — Z79899 Other long term (current) drug therapy: Secondary | ICD-10-CM | POA: Diagnosis not present

## 2014-06-26 DIAGNOSIS — D638 Anemia in other chronic diseases classified elsewhere: Secondary | ICD-10-CM | POA: Diagnosis present

## 2014-06-26 DIAGNOSIS — Z881 Allergy status to other antibiotic agents status: Secondary | ICD-10-CM | POA: Diagnosis not present

## 2014-06-26 DIAGNOSIS — Z91048 Other nonmedicinal substance allergy status: Secondary | ICD-10-CM | POA: Diagnosis not present

## 2014-06-26 DIAGNOSIS — R748 Abnormal levels of other serum enzymes: Secondary | ICD-10-CM | POA: Diagnosis not present

## 2014-06-26 DIAGNOSIS — F209 Schizophrenia, unspecified: Secondary | ICD-10-CM | POA: Diagnosis present

## 2014-06-26 DIAGNOSIS — Z21 Asymptomatic human immunodeficiency virus [HIV] infection status: Secondary | ICD-10-CM | POA: Diagnosis present

## 2014-06-26 DIAGNOSIS — R000 Tachycardia, unspecified: Secondary | ICD-10-CM

## 2014-06-26 DIAGNOSIS — N2581 Secondary hyperparathyroidism of renal origin: Secondary | ICD-10-CM | POA: Diagnosis present

## 2014-06-26 DIAGNOSIS — E8770 Fluid overload, unspecified: Secondary | ICD-10-CM

## 2014-06-26 DIAGNOSIS — F29 Unspecified psychosis not due to a substance or known physiological condition: Secondary | ICD-10-CM | POA: Diagnosis present

## 2014-06-26 DIAGNOSIS — Z91018 Allergy to other foods: Secondary | ICD-10-CM | POA: Diagnosis not present

## 2014-06-26 DIAGNOSIS — F1290 Cannabis use, unspecified, uncomplicated: Secondary | ICD-10-CM | POA: Diagnosis present

## 2014-06-26 LAB — CBC
HEMATOCRIT: 25.1 % — AB (ref 36.0–46.0)
Hemoglobin: 7.9 g/dL — ABNORMAL LOW (ref 12.0–15.0)
MCH: 27.2 pg (ref 26.0–34.0)
MCHC: 31.5 g/dL (ref 30.0–36.0)
MCV: 86.6 fL (ref 78.0–100.0)
Platelets: 187 10*3/uL (ref 150–400)
RBC: 2.9 MIL/uL — ABNORMAL LOW (ref 3.87–5.11)
RDW: 20.5 % — ABNORMAL HIGH (ref 11.5–15.5)
WBC: 5.4 10*3/uL (ref 4.0–10.5)

## 2014-06-26 LAB — BASIC METABOLIC PANEL
Anion gap: 17 — ABNORMAL HIGH (ref 5–15)
BUN: 83 mg/dL — AB (ref 6–23)
CO2: 21 mmol/L (ref 19–32)
CREATININE: 13.03 mg/dL — AB (ref 0.50–1.10)
Calcium: 8.9 mg/dL (ref 8.4–10.5)
Chloride: 98 mmol/L (ref 96–112)
GFR, EST AFRICAN AMERICAN: 4 mL/min — AB (ref >90)
GFR, EST NON AFRICAN AMERICAN: 3 mL/min — AB (ref >90)
Glucose, Bld: 105 mg/dL — ABNORMAL HIGH (ref 70–99)
Potassium: 6.3 mmol/L (ref 3.5–5.1)
Sodium: 136 mmol/L (ref 135–145)

## 2014-06-26 LAB — TROPONIN I: TROPONIN I: 0.1 ng/mL — AB (ref <0.031)

## 2014-06-26 MED ORDER — NEPRO/CARBSTEADY PO LIQD
237.0000 mL | ORAL | Status: DC | PRN
  Filled 2014-06-26: qty 237

## 2014-06-26 MED ORDER — LIDOCAINE HCL (PF) 1 % IJ SOLN: 5.0000 mL | INTRAMUSCULAR | Status: DC | PRN

## 2014-06-26 MED ORDER — SODIUM CHLORIDE 0.9 % IV SOLN: 100.0000 mL | INTRAVENOUS | Status: DC | PRN

## 2014-06-26 MED ORDER — DARBEPOETIN ALFA 100 MCG/0.5ML IJ SOSY
PREFILLED_SYRINGE | INTRAMUSCULAR | Status: AC
  Administered 2014-06-26: 100 ug via INTRAVENOUS
  Filled 2014-06-26: qty 0.5

## 2014-06-26 MED ORDER — ALTEPLASE 2 MG IJ SOLR
2.0000 mg | Freq: Once | INTRAMUSCULAR | Status: DC | PRN
  Filled 2014-06-26: qty 2

## 2014-06-26 MED ORDER — HEPARIN SODIUM (PORCINE) 1000 UNIT/ML DIALYSIS: 100.0000 [IU]/kg | INTRAMUSCULAR | Status: DC | PRN

## 2014-06-26 MED ORDER — PENTAFLUOROPROP-TETRAFLUOROETH EX AERO: 1.0000 "application " | INHALATION_SPRAY | CUTANEOUS | Status: DC | PRN

## 2014-06-26 MED ORDER — DARBEPOETIN ALFA 100 MCG/0.5ML IJ SOSY
100.0000 ug | PREFILLED_SYRINGE | INTRAMUSCULAR | Status: DC
  Administered 2014-06-26: 100 ug via INTRAVENOUS

## 2014-06-26 MED ORDER — LIDOCAINE-PRILOCAINE 2.5-2.5 % EX CREA
1.0000 "application " | TOPICAL_CREAM | CUTANEOUS | Status: DC | PRN
  Filled 2014-06-26: qty 5

## 2014-06-26 MED ORDER — HEPARIN SODIUM (PORCINE) 1000 UNIT/ML DIALYSIS: 1000.0000 [IU] | INTRAMUSCULAR | Status: DC | PRN

## 2014-06-26 NOTE — Progress Notes (Signed)
Family Medicine Teaching Service Daily Progress Note Intern Pager: [phone]  Patient name: Sara [name] Medical record number: [id number] Date of birth: June 26, 1980 Age: 34 y.o. Gender: female  Primary Care Provider: Tommi Rumps, MD Consultants: Nephrology Code Status: Full  Pt Overview and Major Events to Date:  3/20: Admitted for worsening dyspnea  Assessment and Plan: Sara [name] is a 33 y.o. female presenting with shortness of breath. PMH is significant for lupus, ESRD secondary to lupus nephritis, hypertension, bipolar 1 disorder, schizophrenia, psychosis.  #Dyspnea with chest pain: Patient with new onset shortness of breath x1 day. Pleuritic CP described by patient as sharp CP associated to inspiration and coughing. She has had this in the past with no sign of pericarditis. She was unable to get all her fluid off at HD on Friday. CXR with signs of congestion and physical exam with fluid overload. Has not required any oxygen. BNP 3820. Troponin elevated 0.07 (suspect likely due to ESRD). EKG with sinus tachycardia. Last Echo 05/2014 with reduced EF 30-35%. Symptoms secondary to noncompliance with HD and meds. Worsening, now requiring O2. HD this AM. -Monitor on telemtry; transferred to SDU for better monitoring yesterday -PE work-up negative- CTA negative -cycle troponins appropriate in setting of ESRD. -EKG pending -trend labs -O2 as needed to keep sats >92% - now requiring 3L [place] -tylenol prn for pain  #ESRD: Patient gets HD on [name]. However she has no particular site she attends and just goes into hospitals for HD. Cr on admission 11.1. CXR with fluid overload and patient with physical exam findings consistent with volume overload. Now with oxygen requirement this morning.   -Nephrology consulted; appreciate rec - HD this am -need to clarify were patient should be getting HD when she leaves hospital -renal diet  #Anemia: Baseline appears to be 9-10. Low secondary to  missing ESA at HD and increase volume component. - Today Hbg 7.9 -will continue to monitor  #Hyperkalemia: 5.5 on admission. Today potassium 6.3. - Nephrology consulted; appreciate the recommendations. - Anticipating HD today -continue to monitor BMPs  #Bipolar/Schizophrenia: Patient has a history significant for bipolar 1, schizophrenia, psychosis. Recently discharged to [facility] but no longer inpatient(unsure of circumstances). She is appropriate today despite having not taken any of her medications in days.  - Continue home medication of Haldol - prn versed added as patient now has "allergy" to ativan for agitation/psychosis  #Lupus: Patient has long history of confirmed diagnosis. - Continue home colchicine  #Hypertension: 152/100 on admission. Patient had not taken any BP medications today. Elevated pressures primarely due to fluid overload. - Continue home hydralazine; also has prn hydralazine for elevated pressures - Continue home Imdur - Continue home metoprolol  FEN/GI: Renal diet; SLIV Prophylaxis: heparin SQ but SCDs if patient refuses (Document any refusals)  Disposition: HD today; continue to monitor. Pending improvement.  Subjective:  Patient doing well this morning. She required oxygen overnight and BPs continued to be elevated. She has no complaints or concerns at this time. She is still concerned about how she is going to get her medications when she is discharged from hospital since pharmacy will not refill.   Objective: Temp:  [97.9 F (36.6 C)-100.5 F (38.1 C)] 99.1 F (37.3 C) (03/21 0400) Pulse Rate:  [102-139] 117 (03/21 0500) Resp:  [18-37] 28 (03/21 0500) BP: (116-185)/(98-129) 174/109 mmHg (03/21 0500) SpO2:  [81 %-99 %] 93 % (03/21 0500) Weight:  [183 lb 6.8 oz (83.2 kg)] 183 lb 6.8 oz (83.2 kg) (03/20  1910) Physical Exam: GEN: Pleasant, in no acute distress; cooperative with exam. HEENT: Mucous membranesmoist; EOM intact;  swollen appearing face with eyelid edema CHEST: Normal respiration, clear to auscultation bilaterally.  HEART: tachycardia and regular rhythm. There are no murmur, rub, or gallops.  ABDOMEN: soft and non-tender; no masses, no organomegaly, normal abdominal bowel sounds EXTREMITIES: RUE with AVF, large with palpable thrill. RUE slightly swollen throughout, nontender. 2+ edema LE bilaterally. PULSES: 2+ and symmetric SKIN: Normal hydration no rash or ulceration CNS: Cranial nerves 2-12 grossly intact no focal lateralizing neurologic deficit. Speech is fluent PSYCH: alert and oriented x4; does not appear anxious or depressed; affect is appropriate.  Laboratory:  Recent Labs Lab 06/22/14 2354 06/25/14 1104 06/26/14 0405  WBC 4.7 5.3 5.4  HGB 7.7* 8.1* 7.9*  HCT 24.2* 25.1* 25.1*  PLT 249 217 187    Recent Labs Lab 06/22/14 2354 06/25/14 1104 06/26/14 0405  NA 140  --  136  K 6.0*  --  6.3*  CL 103  --  98  CO2 25  --  21  BUN 87*  --  83*  CREATININE 12.08* 11.69* 13.03*  CALCIUM 8.7  --  8.9  GLUCOSE 100*  --  105*    Imaging/Diagnostic Tests: Ct Angio Chest Pe W/cm &/or Wo Cm  06/25/2014   CLINICAL DATA:  CHEST PAIN, DYSPNEA, TACHYCARDIA, LUPUS, HTN, ESRD, PT. MISSED DIALYSIS THIS PAST WEEK.UNSURE OF DAYS, 80 ML OMNI 350, PT. WAS EXTREMENLY SHORT OF BREATH DURING SCAN,  EXAM: CT ANGIOGRAPHY CHEST WITH CONTRAST  TECHNIQUE: Multidetector CT imaging of the chest was performed using the standard protocol during bolus administration of intravenous contrast. Multiplanar CT image reconstructions and MIPs were obtained to evaluate the vascular anatomy.  CONTRAST:  17mL OMNIPAQUE IOHEXOL 350 MG/ML SOLN  COMPARISON:  Current chest radiograph.  FINDINGS: No evidence of a pulmonary embolus.  Heart is mildly enlarged. There are moderate coronary artery calcifications. Small pericardial effusion.  Great vessels are normal in caliber.  No aortic dissection.  There is diffuse edema  throughout the mediastinum extending to the hila. No mediastinal or hilar masses or adenopathy.  Very hypo attenuating areas in the lower aspects of the right left thyroid lobes likely ill-defined nodules. Slightly enlarged right axillary lymph nodes, largest measuring 12 mm in short axis, likely reactive.  Moderate right and small left pleural effusions. Lungs show diffuse bilateral interstitial thickening. Consolidation is noted in the left upper lobe and there is dependent opacity in both lower lobes, right greater than left.  There is diffuse subcutaneous soft tissue edema. Trace ascites is noted in the visualized upper abdomen.  Minor degenerative spurring along the thoracic spine. No osteoblastic or osteolytic lesions.  Review of the MIP images confirms the above findings.  IMPRESSION: 1. No evidence of a pulmonary embolus. 2. Findings consistent with fluid overload/congestive heart failure. There is interstitial edema, moderate right and small left pleural effusions, mild cardiomegaly and a small pericardial effusion, trace ascites and diffuse subcutaneous edema. 3. Patchy airspace consolidation in the left upper lobe could reflect superimposed pneumonia but is more likely asymmetric airspace edema.   Electronically Signed   By: Lajean Manes M.D.   On: 06/25/2014 10:35    Katheren Shams, DO 06/26/2014, 6:30 AM PGY-1, [place] Intern pager: [phone], text pages welcome

## 2014-06-26 NOTE — Clinical Documentation Improvement (Signed)
Possible Clinical Conditions?  AIDS HIV Disease HIV (+) unsymptomatic Other Condition Cannot Clinically Determine   Supporting Information:(As per notes) "34 y.o. female hx of HIV"    Thank You, Alessandra Grout, RN, BSN, CCDS,Clinical Documentation Specialist:  [phone]  [phone]=[facility]- Health Information Management

## 2014-06-26 NOTE — Progress Notes (Signed)
Subjective:   No complaints, says breathing better  Objective Filed Vitals:   06/26/14 0900 06/26/14 0930 06/26/14 1000 06/26/14 1030  BP: 193/109 167/108 173/112 181/113  Pulse: 123 122 128 129  Temp:      TempSrc:      Resp: 33 34 31 31  Height:      Weight:      SpO2: 93% 93%     Physical Exam General: alert and oriented. No acute distress. Facial edema. ON HD Heart: RRR 2/6 systolic murmur Lungs: CTA, unlabored Abdomen: soft, nontender +BS  Extremities: 1+ LE edema Dialysis Access: R AVF patent on HD  Assessment/Plan: 1. Volume overload/ SOB- Secondary to Noncompliance- breathing better this AM. CT angio- no PE. ECHO in feb. EF 30-35% 2. ESRD - HD [name]/ no outpt center. Plans to come back to ED wendsday for treatment  3. Hypertension/volume - 173/112 BP ^ mainly sec to volume/missed home meds- hydralazine and metop- uf goal 5000 4. Anemia - HGB 7.9- will start ESA 5. Metabolic bone disease - cont phoslo.  6. Nutrition - Renal Diet / renal vit 7. Noncompliance with Medical And HD therapy- major component of admit 8. Schizophrenia and bipolar disorder meds per admit  Shelle Iron, NP [facility] [phone]  Pt seen, examined and agree w A/P as above.  Kelly Splinter MD pager [phone]    cell [phone] 06/26/2014, 12:17 PM    06/26/2014,10:49 AM  LOS: 1 day    Additional Objective Labs: Basic Metabolic Panel:  Recent Labs Lab 06/22/14 2354 06/25/14 1104 06/26/14 0405  NA 140  --  136  K 6.0*  --  6.3*  CL 103  --  98  CO2 25  --  21  GLUCOSE 100*  --  105*  BUN 87*  --  83*  CREATININE 12.08* 11.69* 13.03*  CALCIUM 8.7  --  8.9   Liver Function Tests: No results for input(s): AST, ALT, ALKPHOS, BILITOT, PROT, ALBUMIN in the last 168 hours. No results for input(s): LIPASE, AMYLASE in the last 168 hours. CBC:  Recent Labs Lab 06/22/14 2354 06/25/14 1104 06/26/14 0405  WBC 4.7 5.3 5.4  NEUTROABS 2.9  --   --   HGB  7.7* 8.1* 7.9*  HCT 24.2* 25.1* 25.1*  MCV 88.6 85.4 86.6  PLT 249 217 187   Blood Culture    Component Value Date/Time   SDES URINE, CATHETERIZED 02/20/2014 0921   SPECREQUEST NONE 02/20/2014 0921   CULT NO GROWTH Performed at [place]  02/20/2014 0921   REPTSTATUS 02/21/2014 FINAL 02/20/2014 0921    Cardiac Enzymes:  Recent Labs Lab 06/25/14 1104 06/25/14 1714 06/26/14 0003  TROPONINI 0.05* 0.06* 0.10*   CBG: No results for input(s): GLUCAP in the last 168 hours. Iron Studies: No results for input(s): IRON, TIBC, TRANSFERRIN, FERRITIN in the last 72 hours. @lablastinr3 @ Studies/Results: Ct Angio Chest Pe W/cm &/or Wo Cm  06/25/2014   CLINICAL DATA:  CHEST PAIN, DYSPNEA, TACHYCARDIA, LUPUS, HTN, ESRD, PT. MISSED DIALYSIS THIS PAST WEEK.UNSURE OF DAYS, 80 ML OMNI 350, PT. WAS EXTREMENLY SHORT OF BREATH DURING SCAN,  EXAM: CT ANGIOGRAPHY CHEST WITH CONTRAST  TECHNIQUE: Multidetector CT imaging of the chest was performed using the standard protocol during bolus administration of intravenous contrast. Multiplanar CT image reconstructions and MIPs were obtained to evaluate the vascular anatomy.  CONTRAST:  57mL OMNIPAQUE IOHEXOL 350 MG/ML SOLN  COMPARISON:  Current chest radiograph.  FINDINGS: No evidence of a pulmonary embolus.  Heart is mildly enlarged. There are moderate coronary artery calcifications. Small pericardial effusion.  Great vessels are normal in caliber.  No aortic dissection.  There is diffuse edema throughout the mediastinum extending to the hila. No mediastinal or hilar masses or adenopathy.  Very hypo attenuating areas in the lower aspects of the right left thyroid lobes likely ill-defined nodules. Slightly enlarged right axillary lymph nodes, largest measuring 12 mm in short axis, likely reactive.  Moderate right and small left pleural effusions. Lungs show diffuse bilateral interstitial thickening. Consolidation is noted in the left upper lobe and there is  dependent opacity in both lower lobes, right greater than left.  There is diffuse subcutaneous soft tissue edema. Trace ascites is noted in the visualized upper abdomen.  Minor degenerative spurring along the thoracic spine. No osteoblastic or osteolytic lesions.  Review of the MIP images confirms the above findings.  IMPRESSION: 1. No evidence of a pulmonary embolus. 2. Findings consistent with fluid overload/congestive heart failure. There is interstitial edema, moderate right and small left pleural effusions, mild cardiomegaly and a small pericardial effusion, trace ascites and diffuse subcutaneous edema. 3. Patchy airspace consolidation in the left upper lobe could reflect superimposed pneumonia but is more likely asymmetric airspace edema.   Electronically Signed   By: Lajean Manes M.D.   On: 06/25/2014 10:35   Medications:   . calcium acetate  2,001 mg Oral TID WC  . [START ON 06/29/2014] colchicine  0.3 mg Oral Once per day on Mon Thu  . haloperidol  5 mg Oral BID  . heparin  5,000 Units Subcutaneous 3 times per day  . hydrALAZINE  25 mg Oral 3 times per day  . isosorbide mononitrate  30 mg Oral Daily  . metoprolol tartrate  25 mg Oral BID  . multivitamin  1 tablet Oral QHS  . sodium chloride  3 mL Intravenous Q12H

## 2014-06-26 NOTE — Progress Notes (Addendum)
CRITICAL VALUE ALERT  Critical value received:  K+ 6.3  Date of notification:  03/212016  Time of notification:  5:00 a.m.  Critical value read back: Yes  Nurse who received alert: Vedia Coffer  MD notified (1st page):  Dr. Gerarda Fraction  Time of first page:  5:00 a.m.  MD notified (2nd page):   Time of second page:   Responding MD: Dr. Gerarda Fraction  No orders given.  Time MD responded:5:20 a.m.

## 2014-06-26 NOTE — Progress Notes (Signed)
Hydralazine given prior to sending to dialysis. BP 172/136. Transported on monitor and on O2 3 L/min.

## 2014-06-26 NOTE — Progress Notes (Signed)
Gave patient medications at 1257.  She asked, several times, if she could check herself out if she checked herself in.  I stepped out of her room to check her chart for IVC documentation. When I returned to the patient's room, she was nowhere to be found.  MD paged and notified.  He returned the call immediately and confirmed that she was not IVC on this admission.  CCMD called and informed of the patient's leaving.  No AMA paperwork was signed.

## 2014-06-28 ENCOUNTER — Emergency Department (HOSPITAL_COMMUNITY)
Admission: EM | Admit: 2014-06-28 | Discharge: 2014-06-28 | Disposition: A | Discharge status: Home / Self Care | Payer: Medicare Other | Attending: Emergency Medicine | Admitting: Emergency Medicine

## 2014-06-28 ENCOUNTER — Encounter (HOSPITAL_COMMUNITY): Payer: Self-pay | Admitting: Emergency Medicine

## 2014-06-28 DIAGNOSIS — Z791 Long term (current) use of non-steroidal anti-inflammatories (NSAID): Secondary | ICD-10-CM | POA: Insufficient documentation

## 2014-06-28 DIAGNOSIS — R011 Cardiac murmur, unspecified: Secondary | ICD-10-CM | POA: Diagnosis not present

## 2014-06-28 DIAGNOSIS — N186 End stage renal disease: Secondary | ICD-10-CM | POA: Insufficient documentation

## 2014-06-28 DIAGNOSIS — R000 Tachycardia, unspecified: Secondary | ICD-10-CM | POA: Insufficient documentation

## 2014-06-28 DIAGNOSIS — I120 Hypertensive chronic kidney disease with stage 5 chronic kidney disease or end stage renal disease: Secondary | ICD-10-CM | POA: Diagnosis not present

## 2014-06-28 DIAGNOSIS — F29 Unspecified psychosis not due to a substance or known physiological condition: Secondary | ICD-10-CM | POA: Diagnosis not present

## 2014-06-28 DIAGNOSIS — Z720 Tobacco use: Secondary | ICD-10-CM | POA: Diagnosis not present

## 2014-06-28 DIAGNOSIS — Z8739 Personal history of other diseases of the musculoskeletal system and connective tissue: Secondary | ICD-10-CM | POA: Insufficient documentation

## 2014-06-28 DIAGNOSIS — F209 Schizophrenia, unspecified: Secondary | ICD-10-CM | POA: Diagnosis not present

## 2014-06-28 DIAGNOSIS — Z79899 Other long term (current) drug therapy: Secondary | ICD-10-CM | POA: Diagnosis not present

## 2014-06-28 DIAGNOSIS — Z590 Homelessness: Secondary | ICD-10-CM | POA: Insufficient documentation

## 2014-06-28 DIAGNOSIS — Z992 Dependence on renal dialysis: Secondary | ICD-10-CM | POA: Insufficient documentation
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
## 2014-06-28 NOTE — ED Notes (Signed)
Pt states she is here for dialysis but wants to leave due to needing to be at work.

## 2014-06-28 NOTE — ED Notes (Signed)
Social work at bedside.  

## 2014-06-28 NOTE — ED Notes (Addendum)
Pt sts she is in the process of finding a new dialysis tx center. She gets dialysis [facility]- last completed on Monday.

## 2014-06-29 ENCOUNTER — Inpatient Hospital Stay (HOSPITAL_COMMUNITY)
Admission: EM | Admit: 2014-06-29 | Discharge: 2014-06-29 | DRG: 871 | Discharge status: Against Medical Advice | Payer: Medicare Other | Attending: Family Medicine | Admitting: Family Medicine

## 2014-06-29 ENCOUNTER — Emergency Department (HOSPITAL_COMMUNITY): Payer: Medicare Other

## 2014-06-29 ENCOUNTER — Inpatient Hospital Stay (HOSPITAL_COMMUNITY): Payer: Medicare Other

## 2014-06-29 ENCOUNTER — Encounter (HOSPITAL_COMMUNITY): Payer: Self-pay | Admitting: Emergency Medicine

## 2014-06-29 DIAGNOSIS — R0602 Shortness of breath: Secondary | ICD-10-CM | POA: Diagnosis present

## 2014-06-29 DIAGNOSIS — D631 Anemia in chronic kidney disease: Secondary | ICD-10-CM | POA: Diagnosis present

## 2014-06-29 DIAGNOSIS — Y95 Nosocomial condition: Secondary | ICD-10-CM | POA: Diagnosis present

## 2014-06-29 DIAGNOSIS — N185 Chronic kidney disease, stage 5: Secondary | ICD-10-CM | POA: Diagnosis not present

## 2014-06-29 DIAGNOSIS — F319 Bipolar disorder, unspecified: Secondary | ICD-10-CM | POA: Diagnosis present

## 2014-06-29 DIAGNOSIS — I120 Hypertensive chronic kidney disease with stage 5 chronic kidney disease or end stage renal disease: Secondary | ICD-10-CM | POA: Diagnosis present

## 2014-06-29 DIAGNOSIS — A419 Sepsis, unspecified organism: Principal | ICD-10-CM | POA: Diagnosis present

## 2014-06-29 DIAGNOSIS — I151 Hypertension secondary to other renal disorders: Secondary | ICD-10-CM | POA: Diagnosis not present

## 2014-06-29 DIAGNOSIS — Z992 Dependence on renal dialysis: Secondary | ICD-10-CM | POA: Diagnosis not present

## 2014-06-29 DIAGNOSIS — F17210 Nicotine dependence, cigarettes, uncomplicated: Secondary | ICD-10-CM | POA: Diagnosis present

## 2014-06-29 DIAGNOSIS — N2889 Other specified disorders of kidney and ureter: Secondary | ICD-10-CM

## 2014-06-29 DIAGNOSIS — I5020 Unspecified systolic (congestive) heart failure: Secondary | ICD-10-CM | POA: Diagnosis present

## 2014-06-29 DIAGNOSIS — F209 Schizophrenia, unspecified: Secondary | ICD-10-CM | POA: Diagnosis present

## 2014-06-29 DIAGNOSIS — I509 Heart failure, unspecified: Secondary | ICD-10-CM

## 2014-06-29 DIAGNOSIS — R188 Other ascites: Secondary | ICD-10-CM | POA: Diagnosis present

## 2014-06-29 DIAGNOSIS — Z9115 Patient's noncompliance with renal dialysis: Secondary | ICD-10-CM | POA: Diagnosis present

## 2014-06-29 DIAGNOSIS — J189 Pneumonia, unspecified organism: Secondary | ICD-10-CM | POA: Diagnosis present

## 2014-06-29 DIAGNOSIS — F259 Schizoaffective disorder, unspecified: Secondary | ICD-10-CM | POA: Diagnosis present

## 2014-06-29 DIAGNOSIS — N189 Chronic kidney disease, unspecified: Secondary | ICD-10-CM | POA: Insufficient documentation

## 2014-06-29 DIAGNOSIS — M3214 Glomerular disease in systemic lupus erythematosus: Secondary | ICD-10-CM | POA: Diagnosis present

## 2014-06-29 DIAGNOSIS — N186 End stage renal disease: Secondary | ICD-10-CM | POA: Diagnosis present

## 2014-06-29 DIAGNOSIS — Z79899 Other long term (current) drug therapy: Secondary | ICD-10-CM | POA: Diagnosis not present

## 2014-06-29 LAB — COMPREHENSIVE METABOLIC PANEL
ALT: 56 U/L — AB (ref 0–35)
ANION GAP: 15 (ref 5–15)
AST: 61 U/L — ABNORMAL HIGH (ref 0–37)
Albumin: 3.1 g/dL — ABNORMAL LOW (ref 3.5–5.2)
Alkaline Phosphatase: 87 U/L (ref 39–117)
BILIRUBIN TOTAL: 0.6 mg/dL (ref 0.3–1.2)
BUN: 67 mg/dL — ABNORMAL HIGH (ref 6–23)
CO2: 22 mmol/L (ref 19–32)
CREATININE: 12.43 mg/dL — AB (ref 0.50–1.10)
Calcium: 9.1 mg/dL (ref 8.4–10.5)
Chloride: 102 mmol/L (ref 96–112)
GFR calc Af Amer: 4 mL/min — ABNORMAL LOW (ref >90)
GFR calc non Af Amer: 3 mL/min — ABNORMAL LOW (ref >90)
GLUCOSE: 98 mg/dL (ref 70–99)
Potassium: 4.5 mmol/L (ref 3.5–5.1)
Sodium: 139 mmol/L (ref 135–145)
Total Protein: 7.1 g/dL (ref 6.0–8.3)

## 2014-06-29 LAB — CBC WITH DIFFERENTIAL/PLATELET
BASOS ABS: 0.1 10*3/uL (ref 0.0–0.1)
Basophils Relative: 1 % (ref 0–1)
Eosinophils Absolute: 0.1 10*3/uL (ref 0.0–0.7)
Eosinophils Relative: 1 % (ref 0–5)
HCT: 26.7 % — ABNORMAL LOW (ref 36.0–46.0)
Hemoglobin: 8.6 g/dL — ABNORMAL LOW (ref 12.0–15.0)
LYMPHS PCT: 22 % (ref 12–46)
Lymphs Abs: 1.1 10*3/uL (ref 0.7–4.0)
MCH: 27 pg (ref 26.0–34.0)
MCHC: 32.2 g/dL (ref 30.0–36.0)
MCV: 84 fL (ref 78.0–100.0)
Monocytes Absolute: 0.4 10*3/uL (ref 0.1–1.0)
Monocytes Relative: 7 % (ref 3–12)
NEUTROS PCT: 69 % (ref 43–77)
Neutro Abs: 3.5 10*3/uL (ref 1.7–7.7)
PLATELETS: 259 10*3/uL (ref 150–400)
RBC: 3.18 MIL/uL — ABNORMAL LOW (ref 3.87–5.11)
RDW: 20.4 % — ABNORMAL HIGH (ref 11.5–15.5)
WBC: 5.1 10*3/uL (ref 4.0–10.5)

## 2014-06-29 LAB — I-STAT CHEM 8, ED
BUN: 66 mg/dL — AB (ref 6–23)
CALCIUM ION: 1.09 mmol/L — AB (ref 1.12–1.23)
CREATININE: 12.3 mg/dL — AB (ref 0.50–1.10)
Chloride: 102 mmol/L (ref 96–112)
Glucose, Bld: 94 mg/dL (ref 70–99)
HEMATOCRIT: 36 % (ref 36.0–46.0)
HEMOGLOBIN: 12.2 g/dL (ref 12.0–15.0)
Potassium: 4.5 mmol/L (ref 3.5–5.1)
Sodium: 138 mmol/L (ref 135–145)
TCO2: 21 mmol/L (ref 0–100)

## 2014-06-29 LAB — TROPONIN I: Troponin I: 0.09 ng/mL — ABNORMAL HIGH (ref <0.031)

## 2014-06-29 LAB — BRAIN NATRIURETIC PEPTIDE: B Natriuretic Peptide: >4500 pg/mL — ABNORMAL HIGH (ref 0.0–100.0)

## 2014-06-29 MED ORDER — DEXTROSE 5 % IV SOLN: 1.0000 g | INTRAVENOUS | Status: DC

## 2014-06-29 MED ORDER — SODIUM CHLORIDE 0.9 % IV SOLN: 250.0000 mL | INTRAVENOUS | Status: DC | PRN

## 2014-06-29 MED ORDER — HEPARIN SODIUM (PORCINE) 5000 UNIT/ML IJ SOLN: 5000.0000 [IU] | Freq: Three times a day (TID) | INTRAMUSCULAR | Status: DC

## 2014-06-29 MED ORDER — SODIUM CHLORIDE 0.9 % IJ SOLN: 3.0000 mL | Freq: Two times a day (BID) | INTRAMUSCULAR | Status: DC

## 2014-06-29 MED ORDER — METOPROLOL TARTRATE 25 MG PO TABS
25.0000 mg | ORAL_TABLET | Freq: Two times a day (BID) | ORAL | Status: DC
  Administered 2014-06-29: 25 mg via ORAL
  Filled 2014-06-29 (×2): qty 1

## 2014-06-29 MED ORDER — SODIUM CHLORIDE 0.9 % IJ SOLN
3.0000 mL | INTRAMUSCULAR | Status: DC | PRN
  Administered 2014-06-29: 3 mL via INTRAVENOUS
  Filled 2014-06-29: qty 3

## 2014-06-29 MED ORDER — AZTREONAM 2 G IJ SOLR
2.0000 g | Freq: Once | INTRAMUSCULAR | Status: DC
  Filled 2014-06-29: qty 2

## 2014-06-29 MED ORDER — HALOPERIDOL 5 MG PO TABS
5.0000 mg | ORAL_TABLET | Freq: Two times a day (BID) | ORAL | Status: DC
  Administered 2014-06-29: 5 mg via ORAL
  Filled 2014-06-29 (×3): qty 1

## 2014-06-29 MED ORDER — SODIUM CHLORIDE 0.9 % IJ SOLN
3.0000 mL | Freq: Two times a day (BID) | INTRAMUSCULAR | Status: DC
  Administered 2014-06-29: 3 mL via INTRAVENOUS

## 2014-06-29 MED ORDER — HYDRALAZINE HCL 25 MG PO TABS
25.0000 mg | ORAL_TABLET | Freq: Three times a day (TID) | ORAL | Status: DC
  Administered 2014-06-29: 25 mg via ORAL
  Filled 2014-06-29 (×5): qty 1

## 2014-06-29 MED ORDER — DEXTROSE 5 % IV SOLN
1.0000 g | Freq: Once | INTRAVENOUS | Status: AC
  Administered 2014-06-29: 1 g via INTRAVENOUS
  Filled 2014-06-29 (×2): qty 1

## 2014-06-29 MED ORDER — VANCOMYCIN HCL 10 G IV SOLR
1500.0000 mg | Freq: Once | INTRAVENOUS | Status: DC
  Filled 2014-06-29: qty 1500

## 2014-06-29 MED ORDER — VANCOMYCIN HCL IN DEXTROSE 750-5 MG/150ML-% IV SOLN
750.0000 mg | INTRAVENOUS | Status: DC
  Administered 2014-06-29: 750 mg via INTRAVENOUS
  Filled 2014-06-29 (×2): qty 150

## 2014-06-29 MED ORDER — COLCHICINE 0.6 MG PO TABS
0.6000 mg | ORAL_TABLET | ORAL | Status: DC
  Administered 2014-06-29: 0.6 mg via ORAL
  Filled 2014-06-29 (×2): qty 1

## 2014-06-29 MED ORDER — ACETAMINOPHEN 500 MG PO TABS: 1000.0000 mg | ORAL_TABLET | Freq: Four times a day (QID) | ORAL | Status: DC | PRN

## 2014-06-29 MED ORDER — CALCIUM ACETATE 667 MG PO CAPS
667.0000 mg | ORAL_CAPSULE | Freq: Four times a day (QID) | ORAL | Status: DC
  Administered 2014-06-29: 1334 mg via ORAL
  Filled 2014-06-29 (×4): qty 3

## 2014-06-29 MED ORDER — ISOSORBIDE MONONITRATE ER 30 MG PO TB24
30.0000 mg | ORAL_TABLET | Freq: Every day | ORAL | Status: DC
  Administered 2014-06-29: 30 mg via ORAL
  Filled 2014-06-29 (×2): qty 1

## 2014-06-29 MED ORDER — RENA-VITE PO TABS: 1.0000 | ORAL_TABLET | Freq: Every day | ORAL | Status: DC

## 2014-06-29 NOTE — ED Notes (Signed)
Pt reports she missed her dialysis session yesterday as the wait time was too long and she had to go to work. Pt reports she dialyzes M,W,F. Pt A&O X4. Pt states she needs emergent dialysis.

## 2014-06-29 NOTE — Progress Notes (Signed)
Pt decided to leave AMA; states that she has a party to attend tonight and will return at 11pm tonight. MD called to speak with pt. RN also advised pt that it would be in her best interest to stay in the hospital. She declines and understands her decision. Pt signed [place] paperwork.  IV removed. Telemetry removed. CCMD notified. MD notified when pt left unit.    Carole Civil, RN

## 2014-06-29 NOTE — ED Notes (Signed)
PA at bedside.

## 2014-06-29 NOTE — Progress Notes (Addendum)
Called by the nurse to the patient's room as pt. Is stating that she wants to leave AMA. Upon entrance and further questioning, [name] stated that she wants to leave because she has "a party to go to tonight that her friends are throwing for her because she has been in the hospital so often". She says that "I will come back between 9 and 11 pm". I instructed the patient that this is VERY unsafe and also inappropriate behavior given that she is admitted to the hospital for treatment of serious medical problems. I informed Ms. Sides that not only is this unsafe, and not in her best interest given her ongoing medical problems, but it is disrespectful of hospital staff who are working hard to care for her and improve her health and quality of life. I informed her that we are working to improve her health, so that she will be able to attend more parties in the future. [name] agreed with me, and acknowledged that we have helped her quite a bit. She also acknowledged that she is currently not safe for discharge, and that leaving the hospital would be against our medical advice. She says that she will think more about whether she will leave or not, but says that she again understands that this is against our medical advice if she does leave.   Paula Compton, MD Family Medicine - PGY 1
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
## 2014-06-29 NOTE — ED Notes (Signed)
Pt reports SOB as well.

## 2014-06-29 NOTE — Progress Notes (Signed)
Admission note:  Arrival Method: Stretcher from HD  Mental Orientation:A&Ox4 Telemetry: Box 6E10  Assessment: See doc flowsheet Skin: Dry;intact IV: L forearm NSL Pain: Denies Tubes: N/A Safety Measures: Bed in lowest position; call light within reach Fall Prevention Safety Plan: Reviewed with pt.  Admission Screening: To be completed 6700 Orientation: Patient has been oriented to the unit, staff and to the room.  Orders have been implemented.Will continue to monitor pt.   Gavin Potters
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
## 2014-06-29 NOTE — ED Notes (Signed)
Report given to veronica in dialysis. States that antibiotics can be given after dialysis.
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
## 2014-06-29 NOTE — Progress Notes (Signed)
I got an in-basket report of patient's BNP which is elevated compared to the last one. I later found out she already signed AMA. Elevated BNP can be due to her ESRD or CHF. I called to talk to her about result but her number is off. Message left for her to call back or return to the hospital soon if not feeling well.
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
## 2014-06-30 ENCOUNTER — Emergency Department (HOSPITAL_COMMUNITY): Payer: Medicare Other

## 2014-06-30 ENCOUNTER — Encounter (HOSPITAL_COMMUNITY): Payer: Self-pay | Admitting: Emergency Medicine

## 2014-06-30 ENCOUNTER — Inpatient Hospital Stay (HOSPITAL_COMMUNITY)
Admission: EM | Admit: 2014-06-30 | Discharge: 2014-07-01 | DRG: 871 | Discharge status: Against Medical Advice | Payer: Medicare Other | Attending: Family Medicine | Admitting: Family Medicine

## 2014-06-30 ENCOUNTER — Emergency Department (HOSPITAL_COMMUNITY)
Admission: EM | Admit: 2014-06-30 | Discharge: 2014-06-30 | Disposition: A | Discharge status: Home / Self Care | Payer: Medicare Other

## 2014-06-30 DIAGNOSIS — Z79899 Other long term (current) drug therapy: Secondary | ICD-10-CM | POA: Diagnosis not present

## 2014-06-30 DIAGNOSIS — F319 Bipolar disorder, unspecified: Secondary | ICD-10-CM | POA: Diagnosis present

## 2014-06-30 DIAGNOSIS — Z888 Allergy status to other drugs, medicaments and biological substances status: Secondary | ICD-10-CM

## 2014-06-30 DIAGNOSIS — F209 Schizophrenia, unspecified: Secondary | ICD-10-CM | POA: Diagnosis present

## 2014-06-30 DIAGNOSIS — Z881 Allergy status to other antibiotic agents status: Secondary | ICD-10-CM | POA: Diagnosis not present

## 2014-06-30 DIAGNOSIS — I5023 Acute on chronic systolic (congestive) heart failure: Secondary | ICD-10-CM

## 2014-06-30 DIAGNOSIS — R072 Precordial pain: Secondary | ICD-10-CM | POA: Diagnosis not present

## 2014-06-30 DIAGNOSIS — I319 Disease of pericardium, unspecified: Secondary | ICD-10-CM

## 2014-06-30 DIAGNOSIS — I150 Renovascular hypertension: Secondary | ICD-10-CM | POA: Diagnosis not present

## 2014-06-30 DIAGNOSIS — Z91048 Other nonmedicinal substance allergy status: Secondary | ICD-10-CM

## 2014-06-30 DIAGNOSIS — Z91199 Patient's noncompliance with other medical treatment and regimen due to unspecified reason: Secondary | ICD-10-CM

## 2014-06-30 DIAGNOSIS — N186 End stage renal disease: Secondary | ICD-10-CM | POA: Diagnosis present

## 2014-06-30 DIAGNOSIS — A419 Sepsis, unspecified organism: Secondary | ICD-10-CM | POA: Diagnosis present

## 2014-06-30 DIAGNOSIS — M329 Systemic lupus erythematosus, unspecified: Secondary | ICD-10-CM | POA: Diagnosis present

## 2014-06-30 DIAGNOSIS — E8770 Fluid overload, unspecified: Secondary | ICD-10-CM | POA: Diagnosis present

## 2014-06-30 DIAGNOSIS — F17210 Nicotine dependence, cigarettes, uncomplicated: Secondary | ICD-10-CM | POA: Diagnosis present

## 2014-06-30 DIAGNOSIS — Z9119 Patient's noncompliance with other medical treatment and regimen: Secondary | ICD-10-CM

## 2014-06-30 DIAGNOSIS — J90 Pleural effusion, not elsewhere classified: Secondary | ICD-10-CM | POA: Diagnosis present

## 2014-06-30 DIAGNOSIS — Z9115 Patient's noncompliance with renal dialysis: Secondary | ICD-10-CM | POA: Diagnosis present

## 2014-06-30 DIAGNOSIS — D631 Anemia in chronic kidney disease: Secondary | ICD-10-CM | POA: Diagnosis present

## 2014-06-30 DIAGNOSIS — J189 Pneumonia, unspecified organism: Secondary | ICD-10-CM | POA: Diagnosis present

## 2014-06-30 DIAGNOSIS — I5022 Chronic systolic (congestive) heart failure: Secondary | ICD-10-CM | POA: Diagnosis present

## 2014-06-30 DIAGNOSIS — I120 Hypertensive chronic kidney disease with stage 5 chronic kidney disease or end stage renal disease: Secondary | ICD-10-CM | POA: Diagnosis present

## 2014-06-30 DIAGNOSIS — F203 Undifferentiated schizophrenia: Secondary | ICD-10-CM | POA: Diagnosis present

## 2014-06-30 DIAGNOSIS — R0602 Shortness of breath: Secondary | ICD-10-CM | POA: Diagnosis present

## 2014-06-30 DIAGNOSIS — Y95 Nosocomial condition: Secondary | ICD-10-CM | POA: Diagnosis present

## 2014-06-30 LAB — BASIC METABOLIC PANEL
Anion gap: 10 (ref 5–15)
BUN: 31 mg/dL — ABNORMAL HIGH (ref 6–23)
CO2: 28 mmol/L (ref 19–32)
Calcium: 8.7 mg/dL (ref 8.4–10.5)
Chloride: 99 mmol/L (ref 96–112)
Creatinine, Ser: 7.75 mg/dL — ABNORMAL HIGH (ref 0.50–1.10)
GFR calc Af Amer: 7 mL/min — ABNORMAL LOW (ref >90)
GFR calc non Af Amer: 6 mL/min — ABNORMAL LOW (ref >90)
Glucose, Bld: 104 mg/dL — ABNORMAL HIGH (ref 70–99)
Potassium: 3.6 mmol/L (ref 3.5–5.1)
Sodium: 137 mmol/L (ref 135–145)

## 2014-06-30 LAB — RENAL FUNCTION PANEL
ANION GAP: 13 (ref 5–15)
Albumin: 2.5 g/dL — ABNORMAL LOW (ref 3.5–5.2)
BUN: 31 mg/dL — AB (ref 6–23)
CO2: 26 mmol/L (ref 19–32)
Calcium: 8.6 mg/dL (ref 8.4–10.5)
Chloride: 102 mmol/L (ref 96–112)
Creatinine, Ser: 7.96 mg/dL — ABNORMAL HIGH (ref 0.50–1.10)
GFR calc Af Amer: 7 mL/min — ABNORMAL LOW (ref >90)
GFR, EST NON AFRICAN AMERICAN: 6 mL/min — AB (ref >90)
Glucose, Bld: 92 mg/dL (ref 70–99)
PHOSPHORUS: 5.8 mg/dL — AB (ref 2.3–4.6)
POTASSIUM: 4 mmol/L (ref 3.5–5.1)
Sodium: 141 mmol/L (ref 135–145)

## 2014-06-30 LAB — CBC WITH DIFFERENTIAL/PLATELET
Basophils Absolute: 0.1 10*3/uL (ref 0.0–0.1)
Basophils Relative: 1 % (ref 0–1)
EOS ABS: 0.1 10*3/uL (ref 0.0–0.7)
Eosinophils Relative: 2 % (ref 0–5)
HEMATOCRIT: 23.4 % — AB (ref 36.0–46.0)
Hemoglobin: 7.3 g/dL — ABNORMAL LOW (ref 12.0–15.0)
LYMPHS ABS: 1.3 10*3/uL (ref 0.7–4.0)
Lymphocytes Relative: 34 % (ref 12–46)
MCH: 26.3 pg (ref 26.0–34.0)
MCHC: 31.2 g/dL (ref 30.0–36.0)
MCV: 84.2 fL (ref 78.0–100.0)
Monocytes Absolute: 0.5 10*3/uL (ref 0.1–1.0)
Monocytes Relative: 12 % (ref 3–12)
NEUTROS ABS: 2 10*3/uL (ref 1.7–7.7)
NEUTROS PCT: 51 % (ref 43–77)
PLATELETS: 246 10*3/uL (ref 150–400)
RBC: 2.78 MIL/uL — ABNORMAL LOW (ref 3.87–5.11)
RDW: 20.3 % — ABNORMAL HIGH (ref 11.5–15.5)
WBC: 3.9 10*3/uL — AB (ref 4.0–10.5)

## 2014-06-30 LAB — BRAIN NATRIURETIC PEPTIDE: B Natriuretic Peptide: >4500 pg/mL — ABNORMAL HIGH (ref 0.0–100.0)

## 2014-06-30 LAB — MAGNESIUM: Magnesium: 2.1 mg/dL (ref 1.5–2.5)

## 2014-06-30 MED ORDER — SODIUM CHLORIDE 0.9 % IV SOLN: 100.0000 mL | INTRAVENOUS | Status: DC | PRN

## 2014-06-30 MED ORDER — HEPARIN SODIUM (PORCINE) 5000 UNIT/ML IJ SOLN
5000.0000 [IU] | Freq: Three times a day (TID) | INTRAMUSCULAR | Status: DC
  Filled 2014-06-30 (×3): qty 1

## 2014-06-30 MED ORDER — ISOSORBIDE MONONITRATE ER 30 MG PO TB24
30.0000 mg | ORAL_TABLET | Freq: Every day | ORAL | Status: DC
  Administered 2014-06-30 – 2014-07-01 (×2): 30 mg via ORAL
  Filled 2014-06-30 (×2): qty 1

## 2014-06-30 MED ORDER — CARVEDILOL 6.25 MG PO TABS
6.2500 mg | ORAL_TABLET | Freq: Two times a day (BID) | ORAL | Status: DC
  Administered 2014-06-30 – 2014-07-01 (×2): 6.25 mg via ORAL
  Filled 2014-06-30 (×4): qty 1

## 2014-06-30 MED ORDER — LIDOCAINE-PRILOCAINE 2.5-2.5 % EX CREA: 1.0000 "application " | TOPICAL_CREAM | CUTANEOUS | Status: DC | PRN

## 2014-06-30 MED ORDER — HEPARIN SODIUM (PORCINE) 1000 UNIT/ML DIALYSIS
1000.0000 [IU] | INTRAMUSCULAR | Status: DC | PRN
  Filled 2014-06-30: qty 1

## 2014-06-30 MED ORDER — METOPROLOL TARTRATE 25 MG PO TABS
25.0000 mg | ORAL_TABLET | Freq: Two times a day (BID) | ORAL | Status: DC
  Administered 2014-06-30: 25 mg via ORAL
  Filled 2014-06-30 (×2): qty 1

## 2014-06-30 MED ORDER — CALCIUM ACETATE (PHOS BINDER) 667 MG PO CAPS
2001.0000 mg | ORAL_CAPSULE | Freq: Three times a day (TID) | ORAL | Status: DC
  Administered 2014-06-30 – 2014-07-01 (×3): 2001 mg via ORAL
  Filled 2014-06-30 (×3): qty 3

## 2014-06-30 MED ORDER — COLCHICINE 0.6 MG PO TABS
0.6000 mg | ORAL_TABLET | ORAL | Status: DC
  Administered 2014-07-01: 0.6 mg via ORAL
  Filled 2014-06-30: qty 1

## 2014-06-30 MED ORDER — PENTAFLUOROPROP-TETRAFLUOROETH EX AERO
1.0000 | INHALATION_SPRAY | CUTANEOUS | Status: DC | PRN
Start: 2014-06-30 — End: 2014-06-30

## 2014-06-30 MED ORDER — NEPRO/CARBSTEADY PO LIQD: 237.0000 mL | ORAL | Status: DC | PRN

## 2014-06-30 MED ORDER — ALTEPLASE 2 MG IJ SOLR: 2.0000 mg | Freq: Once | INTRAMUSCULAR | Status: DC | PRN

## 2014-06-30 MED ORDER — ALTEPLASE 2 MG IJ SOLR
2.0000 mg | Freq: Once | INTRAMUSCULAR | Status: DC | PRN
  Filled 2014-06-30: qty 2

## 2014-06-30 MED ORDER — VANCOMYCIN HCL IN DEXTROSE 750-5 MG/150ML-% IV SOLN
750.0000 mg | INTRAVENOUS | Status: DC
  Filled 2014-06-30: qty 150

## 2014-06-30 MED ORDER — PENTAFLUOROPROP-TETRAFLUOROETH EX AERO: 1.0000 "application " | INHALATION_SPRAY | CUTANEOUS | Status: DC | PRN

## 2014-06-30 MED ORDER — LIDOCAINE HCL (PF) 1 % IJ SOLN: 5.0000 mL | INTRAMUSCULAR | Status: DC | PRN

## 2014-06-30 MED ORDER — HEPARIN SODIUM (PORCINE) 1000 UNIT/ML DIALYSIS: 1000.0000 [IU] | INTRAMUSCULAR | Status: DC | PRN

## 2014-06-30 MED ORDER — CALCIUM ACETATE (PHOS BINDER) 667 MG PO CAPS: 667.0000 mg | ORAL_CAPSULE | ORAL | Status: DC | PRN

## 2014-06-30 MED ORDER — VANCOMYCIN HCL IN DEXTROSE 750-5 MG/150ML-% IV SOLN
750.0000 mg | Freq: Once | INTRAVENOUS | Status: DC
  Administered 2014-07-01: 750 mg via INTRAVENOUS
  Filled 2014-06-30 (×2): qty 150

## 2014-06-30 MED ORDER — VANCOMYCIN HCL IN DEXTROSE 750-5 MG/150ML-% IV SOLN
750.0000 mg | Freq: Once | INTRAVENOUS | Status: AC
  Administered 2014-06-30: 750 mg via INTRAVENOUS
  Filled 2014-06-30: qty 150

## 2014-06-30 MED ORDER — DOXERCALCIFEROL 4 MCG/2ML IV SOLN
INTRAVENOUS | Status: AC
  Administered 2014-06-30: 4 ug via INTRAVENOUS
  Filled 2014-06-30: qty 2

## 2014-06-30 MED ORDER — HALOPERIDOL 5 MG PO TABS
5.0000 mg | ORAL_TABLET | Freq: Two times a day (BID) | ORAL | Status: DC
  Administered 2014-06-30 – 2014-07-01 (×3): 5 mg via ORAL
  Filled 2014-06-30 (×4): qty 2

## 2014-06-30 MED ORDER — HEPARIN SODIUM (PORCINE) 1000 UNIT/ML DIALYSIS
6000.0000 [IU] | Freq: Once | INTRAMUSCULAR | Status: DC
  Filled 2014-06-30: qty 6

## 2014-06-30 MED ORDER — SODIUM CHLORIDE 0.9 % IJ SOLN
3.0000 mL | Freq: Two times a day (BID) | INTRAMUSCULAR | Status: DC
  Administered 2014-06-30: 3 mL via INTRAVENOUS

## 2014-06-30 MED ORDER — GUAIFENESIN ER 600 MG PO TB12
600.0000 mg | ORAL_TABLET | Freq: Two times a day (BID) | ORAL | Status: DC | PRN
  Administered 2014-06-30: 600 mg via ORAL
  Filled 2014-06-30 (×3): qty 1

## 2014-06-30 MED ORDER — DOXERCALCIFEROL 4 MCG/2ML IV SOLN
4.0000 ug | INTRAVENOUS | Status: DC
  Administered 2014-06-30: 4 ug via INTRAVENOUS
  Filled 2014-06-30: qty 2

## 2014-06-30 MED ORDER — HYDRALAZINE HCL 25 MG PO TABS
25.0000 mg | ORAL_TABLET | Freq: Three times a day (TID) | ORAL | Status: DC
  Administered 2014-06-30 (×2): 25 mg via ORAL
  Filled 2014-06-30 (×7): qty 1

## 2014-06-30 MED ORDER — PIPERACILLIN-TAZOBACTAM IN DEX 2-0.25 GM/50ML IV SOLN
2.2500 g | Freq: Three times a day (TID) | INTRAVENOUS | Status: DC
  Administered 2014-06-30 (×3): 2.25 g via INTRAVENOUS
  Filled 2014-06-30 (×7): qty 50

## 2014-06-30 NOTE — ED Notes (Signed)
Called for pt x3, looked outside for pt as well. No answer.

## 2014-06-30 NOTE — Progress Notes (Signed)
Patient adamant of leaving the hospital. Explained to the patient the importance of staying. Paged md on call. Will come to see patient.
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
## 2014-06-30 NOTE — Progress Notes (Signed)
ANTIBIOTIC CONSULT NOTE - INITIAL  Pharmacy Consult for Vancocin and Zosyn Indication: rule out pneumonia  Allergies  Allergen Reactions  . Ativan [Lorazepam] Other (See Comments)    Dysarthria(patient has difficulty speaking and slurred speech); denies swelling, itching, pain, or numbness.  [name] [Ziprasidone Hcl] Itching and Swelling    Tongue swelling  . Keflex [Cephalexin] Swelling    Tongue swelling  . Other Itching and Other (See Comments)    Wool causes itching    Patient Measurements: Height: 5\' 8"  (172.7 cm) Weight: 170 lb (77.111 kg) IBW/kg (Calculated) : 63.9  Vital Signs: Temp: 98.2 F (36.8 C) (03/25 0246) Temp Source: Oral (03/25 0246) BP: 154/110 mmHg (03/25 0246) Pulse Rate: 111 (03/25 0246)  Labs:  Recent Labs  06/29/14 4259 06/29/14 0656 06/30/14 0116  WBC 5.1  --  3.9*  HGB 8.6* 12.2 7.3*  PLT 259  --  246  CREATININE 12.43* 12.30* 7.75*   Estimated Creatinine Clearance: 11.3 mL/min (by C-G formula based on Cr of 7.75).   Microbiology: Recent Results (from the past 720 hour(s))  MRSA PCR Screening     Status: None   Collection Time: 06/08/14  6:30 AM  Result Value Ref Range Status   MRSA by PCR NEGATIVE NEGATIVE Final    Comment:        The GeneXpert MRSA Assay (FDA approved for NASAL specimens only), is one component of a comprehensive MRSA colonization surveillance program. It is not intended to diagnose MRSA infection nor to guide or monitor treatment for MRSA infections.     Medical History: Past Medical History  Diagnosis Date  . Psychosis   . Hypertension   . Lupus   . Lupus nephritis   . Bipolar 1 disorder   . Schizophrenia   . Pregnancy   . ESRD (end stage renal disease)   . Anemia     Medications:  Prescriptions prior to admission  Medication Sig Dispense Refill Last Dose  . acetaminophen (TYLENOL) 500 MG tablet Take 1,000 mg by mouth every 6 (six) hours as needed for moderate pain.   unknown  . calcium  acetate (PHOSLO) 667 MG capsule Take 667-2,001 mg by mouth 4 (four) times daily. Take 2001 mg with breakfast, lunch and dinner, and 667 mg with a snack.   06/30/2014 at Unknown time  . colchicine 0.6 MG tablet Take 1 tablet (0.6 mg total) by mouth every other day. 15 tablet 1 06/30/2014 at Unknown time  . haloperidol (HALDOL) 5 MG tablet Take 1 tablet (5 mg total) by mouth 2 (two) times daily. (Patient taking differently: Take 5-10 mg by mouth 2 (two) times daily. 5mg  in morning, 10mg  in evening) 60 tablet 0 06/30/2014 at Unknown time  . hydrALAZINE (APRESOLINE) 25 MG tablet TAKE 1 TABLET (25 MG TOTAL) BY MOUTH 3 (THREE) TIMES DAILY. 90 tablet 0 06/30/2014 at Unknown time  . ibuprofen (ADVIL) 200 MG tablet Take 1 tablet (200 mg total) by mouth 3 (three) times daily. 90 tablet 0 06/29/2014 at Unknown time  . isosorbide mononitrate (IMDUR) 30 MG 24 hr tablet TAKE 1 TABLET (30 MG TOTAL) BY MOUTH DAILY. 30 tablet 0 06/30/2014 at Unknown time  . metoprolol tartrate (LOPRESSOR) 25 MG tablet TAKE 1 TABLET BY MOUTH TWICE A DAY 60 tablet 0 06/30/2014 at 230 pm   Scheduled:  . calcium acetate  2,001 mg Oral TID WC  . haloperidol  5-10 mg Oral BID  . heparin  5,000 Units Subcutaneous 3 times per day  .  hydrALAZINE  25 mg Oral 3 times per day  . isosorbide mononitrate  30 mg Oral Daily  . metoprolol tartrate  25 mg Oral BID  . sodium chloride  3 mL Intravenous Q12H    Assessment: 34yo female left AMA to go to a party and now returns for further HD for fluid overload, to resume prior tx for PNA.  Goal of Therapy:  Pre-HD vanc level 15-25  Plan:  Last vanc dose 3/24 1200; will resume vanc 750mg  after each HD (noted that pt will be having extra HD sessions) and start Zosyn 2.25g IV Q8H and monitor CBC, Cx, levels prn.  Wynona Neat, PharmD, BCPS  06/30/2014,4:00 AM

## 2014-06-30 NOTE — Progress Notes (Signed)
Called ER for report. Room ready for admit.

## 2014-06-30 NOTE — Progress Notes (Signed)
Family Medicine Teaching Service Daily Progress Note Intern Pager: [phone]  Patient name: Sara [name] Medical record number: [id number] Date of birth: 11-18-1980 Age: 34 y.o. Gender: female  Primary Care Provider: Tommi Rumps, MD Consultants: Renal; Cards Code Status: Full  Pt Overview and Major Events to Date:  3/25: HCAP & Volume overload - Renal c/s for dialysis  Assessment and Plan: ADISSON DEAK is a 34 y.o. female presenting with volume overload and possible HCAP. 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 Looking at her entire picture it seems that our biggest challenge with her is complaince with HD. It is reported by the ED PA that she is sueing her HD center and so is not going to HD at a center. She presents with dyspnea and cough after leaving AMA yesterday without treatment for same symptoms. This could all be due to volume overload but also could have a component due to HCAP which was suspected on previous CXR.   Volume overload, ESRD - Symptomatic with dyspnea, cough and elevated BP. No urgent indication for dialysis. CXR showed interstitial edema and b/l small pleural effusion. BNP elevated yesterday prior to leaving AMA.  - Renal consulted for dialysis and fluid removal. - Challenging situation with likely non compliance and difficulty accomplishing OP HD. - f/u renal recs - Continue phoslo  HCAP: Cough, SOB with leukopenia and mild fever yesterday prior to leaving AMA. Meets Sepsis criteria with tachycardia and leukopenia.  - CXR 3/24: Superimposed left upper and lower lobe pneumonia is not excluded. - Monitor on tele - Trend CBC - Obtain blood cultures and urine cultures - Vanc and zosyn per pharmacy (2/87>>)   Systolic CHF: Denies current CP - Trops mildly elevated 3/24 (likley due to ESRD)and EKG 3/24 = tachy without major findings of ischemia. TTE from 05/2014 with EF 30-35%;  myopericarditis - Repeat EKG due to tachycardia - Will not obtain troponins Unless develops chest pain or has EKG changes - Continue cardiac meds as below - HD to remove fluid today - Cardiology consulted due to myopericarditis - Continue Colchicine  HTN - Elevated today, likely 2/2 volume overload - Continue home meds hydralazine, imdur, and BB  Anemia of chronic renal disease - Hgb at 7.3 (likely decreased 2/2 to volume) baseline appears to be 8 recently - Consider aranesp but will leave to renal - monitor - Consider transfusion if develops signs of ACS or hgb < 7  Bipolar D/o, Schizophrenia - Appropriate affect today but there is clearly concern that she doesn't understand her medical situation - Continue haldol at home dose - Consider Psych c/s  FEN/GI: saline lock, renal diet Prophylaxis: Sub q heparin  Disposition: tele for close monitoring; dispo pending HCAP treatment and dialysis  Subjective:  Denies any complaint this morning.   Objective: Temp:  [97.9 F (36.6 C)-99.6 F (37.6 C)] 98.5 F (36.9 C) (03/25 0831) Pulse Rate:  [109-123] 109 (03/25 0831) Resp:  [16-34] 17 (03/25 0831) BP: (154-192)/(104-123) 166/110 mmHg (03/25 0831) SpO2:  [93 %-100 %] 99 % (03/25 0831) Weight:  [163 lb 12.8 oz (74.3 kg)-174 lb 2.6 oz (79 kg)] 170 lb (77.111 kg) (03/25 0057) Physical Exam: Gen: NAD, lying in bed  HEENT: NCAT,slera white, MMM CV: tachy, split S2, no murmur appreciated Resp: mildly labored, intermittent cough, rhonchi and wheezing b/l L > R Abd: SNTND, BS present, no guarding or organomegaly Ext: 2+ pitting edema symmetrically on BL LE, 2+ DP  pulses BL Neuro: Alert and conversational; No gross motor/sensory deficits  Psych: Appropriate mood and affect; Poor insight  Laboratory:  Recent Labs Lab 06/26/14 0405 06/29/14 0642 06/29/14 0656 06/30/14 0116  WBC 5.4 5.1  --  3.9*  HGB 7.9* 8.6* 12.2 7.3*  HCT 25.1* 26.7* 36.0 23.4*  PLT 187 259  --  246     Recent Labs Lab 06/29/14 0642 06/29/14 0656 06/30/14 0116 06/30/14 0528  NA 139 138 137 141  K 4.5 4.5 3.6 4.0  CL 102 102 99 102  CO2 22  --  28 26  BUN 67* 66* 31* 31*  CREATININE 12.43* 12.30* 7.75* 7.96*  CALCIUM 9.1  --  8.7 8.6  PROT 7.1  --   --   --   BILITOT 0.6  --   --   --   ALKPHOS 87  --   --   --   ALT 56*  --   --   --   AST 61*  --   --   --   GLUCOSE 98 94 104* 92   Imaging/Diagnostic Tests: EKG 06/29/2014: Sinus tachy with QTc 484  CXR 06/29/2014: IMPRESSION: Congestive heart failure. Slight increase in alveolar edema in the left base. A degree of superimposed pneumonia in the left cannot be excluded radiographically.  CT angio 06/28/2014 IMPRESSION: 1. No evidence of a pulmonary embolus. 2. Findings consistent with fluid overload/congestive heart failure. There is interstitial edema, moderate right and small left pleural effusions, mild cardiomegaly and a small pericardial effusion, trace ascites and diffuse subcutaneous edema. 3. Patchy airspace consolidation in the left upper lobe could reflect superimposed pneumonia but is more likely asymmetric airspace edema.  Olam Idler, MD 06/30/2014, 8:42 AM PGY-2, [place] Intern pager: [phone], text pages welcome

## 2014-06-30 NOTE — Progress Notes (Signed)
  [facility] Progress Note   Subjective: readmitted last night , coughing, CXR edema, effusions  Filed Vitals:   06/30/14 0057 06/30/14 0246 06/30/14 0453 06/30/14 0831  BP: 159/106 154/110 166/108 166/110  Pulse: 110 111 109 109  Temp: 98.4 F (36.9 C) 98.2 F (36.8 C) 97.9 F (36.6 C) 98.5 F (36.9 C)  TempSrc: Oral Oral Oral Oral  Resp: 16 18 16 17   Height: 5\' 8"  (1.727 m)     Weight: 77.111 kg (170 lb)     SpO2: 93% 95% 100% 99%   Exam: Alert No rash, cyanosis or gangrene Sclera anicteric, throat clear Chest RRR tachy, no MRG Abd soft, NTND, +BS 1+ LE edema bilat Neuro is nf, Ox 3   HD: Last orders for Feb 2016 at [facility] > 4 hrs59 kg400/800 2K/2CaHeparin 6000 UAVF R  No Vitamin D, Mircera 225 mcg q2wk, Venofer 50 mg on Thurs  Assessment: 1. SOB/ pulm edema/ vol overload - still wet, severely vol overloaded 2. Fever, low grade - on IV abx 3. ESRD no outpatient unit d/t behavioral issues 4. HTN cont meds 5. Anemia will give Aranesp today 200 ug, weekly 50 mg IV Fe 6. MBD phoslo 7. Schizophrenia/ bipolar disorder  Plan - HD today and tomorrow    Kelly Splinter MD  pager [phone]    cell [phone]  06/30/2014, 11:00 AM     Recent Labs Lab 06/29/14 [id number] 06/29/14 0656 06/30/14 0116 06/30/14 0528  NA 139 138 137 141  K 4.5 4.5 3.6 4.0  CL 102 102 99 102  CO2 22  --  28 26  GLUCOSE 98 94 104* 92  BUN 67* 66* 31* 31*  CREATININE 12.43* 12.30* 7.75* 7.96*  CALCIUM 9.1  --  8.7 8.6  PHOS  --   --   --  5.8*    Recent Labs Lab 06/29/14 0642 06/30/14 0528  AST 61*  --   ALT 56*  --   ALKPHOS 87  --   BILITOT 0.6  --   PROT 7.1  --   ALBUMIN 3.1* 2.5*    Recent Labs Lab 06/26/14 0405 06/29/14 0642 06/29/14 0656 06/30/14 0116  WBC 5.4 5.1  --  3.9*  NEUTROABS  --  3.5  --  2.0  HGB 7.9* 8.6* 12.2 7.3*  HCT 25.1* 26.7* 36.0 23.4*  MCV 86.6 84.0  --  84.2  PLT 187 259  --  246   . calcium acetate  2,001 mg  Oral TID WC  . [START ON 07/01/2014] colchicine  0.6 mg Oral QODAY  . haloperidol  5-10 mg Oral BID  . heparin  5,000 Units Subcutaneous 3 times per day  . hydrALAZINE  25 mg Oral 3 times per day  . isosorbide mononitrate  30 mg Oral Daily  . metoprolol tartrate  25 mg Oral BID  . piperacillin-tazobactam (ZOSYN)  IV  2.25 g Intravenous Q8H  . sodium chloride  3 mL Intravenous Q12H  . vancomycin  750 mg Intravenous Q M,W,F-HD     calcium acetate

## 2014-06-30 NOTE — ED Notes (Signed)
Pt left AMA earlier today from hospital and informed staff she would return tonight for admission and dialysis treatment in the morning.  Pt denies changes in her symptoms since leaving.
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
## 2014-07-01 DIAGNOSIS — Z9119 Patient's noncompliance with other medical treatment and regimen: Secondary | ICD-10-CM

## 2014-07-01 DIAGNOSIS — Z91199 Patient's noncompliance with other medical treatment and regimen due to unspecified reason: Secondary | ICD-10-CM

## 2014-07-01 DIAGNOSIS — E8770 Fluid overload, unspecified: Secondary | ICD-10-CM

## 2014-07-01 DIAGNOSIS — J189 Pneumonia, unspecified organism: Secondary | ICD-10-CM

## 2014-07-01 MED ORDER — LEVOFLOXACIN 500 MG PO TABS: 500.0000 mg | ORAL_TABLET | ORAL | Status: DC

## 2014-07-01 MED ORDER — ACETAMINOPHEN 325 MG PO TABS
650.0000 mg | ORAL_TABLET | Freq: Four times a day (QID) | ORAL | Status: DC | PRN
  Administered 2014-07-01: 650 mg via ORAL

## 2014-07-01 MED ORDER — DARBEPOETIN ALFA 200 MCG/0.4ML IJ SOSY
200.0000 ug | PREFILLED_SYRINGE | INTRAMUSCULAR | Status: DC
  Administered 2014-07-01: 200 ug via INTRAVENOUS

## 2014-07-01 MED ORDER — CARVEDILOL 6.25 MG PO TABS: 6.2500 mg | ORAL_TABLET | Freq: Two times a day (BID) | ORAL | Status: DC

## 2014-07-01 MED ORDER — DARBEPOETIN ALFA 200 MCG/0.4ML IJ SOSY
PREFILLED_SYRINGE | INTRAMUSCULAR | Status: AC
  Administered 2014-07-01: 200 ug via INTRAVENOUS
  Filled 2014-07-01: qty 0.4

## 2014-07-01 MED ORDER — ACETAMINOPHEN 325 MG PO TABS
ORAL_TABLET | ORAL | Status: AC
  Administered 2014-07-01: 650 mg via ORAL
  Filled 2014-07-01: qty 2

## 2014-07-01 MED ORDER — LEVOFLOXACIN 750 MG PO TABS
750.0000 mg | ORAL_TABLET | Freq: Once | ORAL | Status: AC
  Administered 2014-07-01: 750 mg via ORAL
  Filled 2014-07-01: qty 1

## 2014-07-01 NOTE — Progress Notes (Signed)
Patient discharged home at 1215.  She had come back from hemodialysis at 1138, removed her PIV by herself, and taken her medications.  She did not want to wait for discharge instructions to be completed.  She asked for and signed [place] paperwork.  [employer] RN-BC, WTA.

## 2014-07-01 NOTE — Progress Notes (Signed)
Family Medicine Teaching Service Daily Progress Note Intern Pager: [phone]  Patient name: Sara [name] Medical record number: [id number] Date of birth: January 27, 1981 Age: 34 y.o. Gender: female  Primary Care Provider: Tommi Rumps, MD Consultants: Renal; Cards Code Status: Full  Pt Overview and Major Events to Date:  3/25: HCAP & Volume overload - Renal c/s for dialysis  Assessment and Plan: Sara [name] is a 34 y.o. female presenting with volume overload and possible HCAP. 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 Looking at her entire picture it seems that our biggest challenge with her is complaince with HD. It is reported by the ED PA that she is sueing her HD center and so is not going to HD at a center. She presents with dyspnea and cough after leaving AMA yesterday without treatment for same symptoms. This could all be due to volume overload but also could have a component due to HCAP which was suspected on previous CXR.   #Volume overload, ESRD - Symptomatic with dyspnea, cough and elevated BP. No urgent indication for dialysis. CXR showed interstitial edema and b/l small pleural effusion. BNP elevated yesterday prior to leaving AMA.  - Renal consulted for dialysis and fluid removal. - Challenging situation with likely non compliance and difficulty accomplishing OP HD. - f/u renal recs - Continue phoslo  #HCAP: Cough, SOB with leukopenia and mild fever yesterday prior to leaving AMA. Meets Sepsis criteria with tachycardia and leukopenia.  - CXR 3/24: Superimposed left upper and lower lobe pneumonia is not excluded. - Monitor on tele - Trend CBC - Obtain blood cultures and urine cultures - Vanc and zosyn per pharmacy (7/12>>)   #Systolic CHF: Denies current CP - Trops mildly elevated 3/24 (likley due to ESRD)and EKG 3/24 = tachy without major findings of ischemia. TTE from 05/2014 with EF  30-35%; myopericarditis - Will not obtain troponins Unless develops chest pain or has EKG changes - Continue cardiac meds as below - HD to remove fluid today - Cardiology consulted due to myopericarditis >> bedside echo showed improved cardiac effusion and EF 40-45% - Continue Colchicine  #HTN - Elevated today, likely 2/2 volume overload - Continue home meds hydralazine, imdur, and BB  #Anemia of chronic renal disease - Hgb at 7.3 (likely decreased 2/2 to volume) baseline appears to be 8 recently - Aranesp per renal - monitor - Consider transfusion if develops signs of ACS or hgb < 7  #Bipolar D/o, Schizophrenia - Appropriate affect this admission but there is clearly concern that she doesn't understand her medical situation - Continue haldol at home dose - Consider Psych c/s  FEN/GI: saline lock, renal diet Prophylaxis: Sub q heparin  Disposition: tele for close monitoring; dispo pending HCAP treatment and dialysis  Subjective:  Currently in HD. No complaint this morning.   Objective: Temp:  [98.1 F (36.7 C)-98.7 F (37.1 C)] 98.1 F (36.7 C) (03/26 0657) Pulse Rate:  [100-114] 103 (03/26 0900) Resp:  [16-37] 20 (03/26 0900) BP: (138-184)/(83-116) 154/106 mmHg (03/26 0900) SpO2:  [97 %-100 %] 99 % (03/26 0709) Weight:  [153 lb 10.6 oz (69.7 kg)-165 lb 12.6 oz (75.2 kg)] 155 lb 10.3 oz (70.6 kg) (03/26 0657) Physical Exam: Gen: NAD, lying in bed  HEENT: NCAT,slera white, MMM CV: tachy, split S2, no murmur appreciated Resp: mildly labored, intermittent cough, rhonchi and wheezing b/l L > R Abd: SNTND, BS present, no guarding or organomegaly Ext: 2+ pitting edema symmetrically  on BL LE, 2+ DP pulses BL Neuro: Alert and conversational; No gross motor/sensory deficits  Psych: Appropriate mood and affect; Poor insight  Laboratory:  Recent Labs Lab 06/26/14 0405 06/29/14 0642 06/29/14 0656 06/30/14 0116  WBC 5.4 5.1  --  3.9*  HGB 7.9* 8.6* 12.2 7.3*  HCT  25.1* 26.7* 36.0 23.4*  PLT 187 259  --  246    Recent Labs Lab 06/29/14 0642 06/29/14 0656 06/30/14 0116 06/30/14 0528  NA 139 138 137 141  K 4.5 4.5 3.6 4.0  CL 102 102 99 102  CO2 22  --  28 26  BUN 67* 66* 31* 31*  CREATININE 12.43* 12.30* 7.75* 7.96*  CALCIUM 9.1  --  8.7 8.6  PROT 7.1  --   --   --   BILITOT 0.6  --   --   --   ALKPHOS 87  --   --   --   ALT 56*  --   --   --   AST 61*  --   --   --   GLUCOSE 98 94 104* 92   Imaging/Diagnostic Tests: EKG 06/29/2014: Sinus tachy with QTc 484  CXR 06/29/2014: IMPRESSION: Congestive heart failure. Slight increase in alveolar edema in the left base. A degree of superimposed pneumonia in the left cannot be excluded radiographically.  CT angio 06/28/2014 IMPRESSION: 1. No evidence of a pulmonary embolus. 2. Findings consistent with fluid overload/congestive heart failure. There is interstitial edema, moderate right and small left pleural effusions, mild cardiomegaly and a small pericardial effusion, trace ascites and diffuse subcutaneous edema. 3. Patchy airspace consolidation in the left upper lobe could reflect superimposed pneumonia but is more likely asymmetric airspace edema.  Elberta Leatherwood, MD 07/01/2014, 9:10 AM PGY-1, [place] Intern pager: [phone], text pages welcome

## 2014-07-01 NOTE — Progress Notes (Signed)
[facility] Progress Note  Assessment/Plan: 1. SOB/ pulm edema/ vol overload - still wet, severely vol overloaded- UF 4.5 3/24, 5.3 3/25 and goal today  2. Fever, low grade ? HCAP- afebrile x 24 hr- on IV Vanc and Zosyn 3. ESRD no outpatient unit d/t behavioral issues 4. HTN cont meds - improved with decrease volume - but LARGE IDWG 5. Anemia Hgb 8.6 down to 7.3 - no ESA given in the last 10 days per [facility] - max ESA give ARanesp 200 today 3/26- on weekly Fe - needs Fe studies - added on to prev blood 6. MBD phoslo iPTH 621 3/7 -hectorol 4 7. Schizophrenia/ bipolar disorder 8. ^ LFT - might be up due to liver congestion  Myriam Jacobson, PA-C [place] [phone] 07/01/2014,9:47 AM  LOS: 1 day   Pt seen, examined and agree w A/P as above. Volume improving. Plan next HD for Monday.  Kelly Splinter MD pager [phone]    cell [phone] 07/01/2014, 11:14 AM    Subjective:   No c/o feels better  Objective Filed Vitals:   07/01/14 0730 07/01/14 0800 07/01/14 0830 07/01/14 0900  BP: 164/104 138/95 146/104 154/106  Pulse: 101 101 109 103  Temp:      TempSrc:      Resp: 16 22 20 20   Height:      Weight:      SpO2:       Physical Exam General: cheerful, talkative NAD Heart: tachy regular Lungs: no rales Abdomen: soft Extremities: tr - 1+ LE edema Dialysis Access: right AVF  Dialysis Orders:Last orders for Feb 2016 at [facility] > 4 hrs59 kg400/800 2K/2CaHeparin 6000 UAVF R  No Vitamin D, Mircera 225 mcg q2wk, Venofer 50 mg on Thurs  Additional Objective Labs: Basic Metabolic Panel:  Recent Labs Lab 06/29/14 0642 06/29/14 0656 06/30/14 0116 06/30/14 0528  NA 139 138 137 141  K 4.5 4.5 3.6 4.0  CL 102 102 99 102  CO2 22  --  28 26  GLUCOSE 98 94 104* 92  BUN 67* 66* 31* 31*  CREATININE 12.43* 12.30* 7.75* 7.96*  CALCIUM 9.1  --  8.7 8.6  PHOS  --   --   --  5.8*   Liver Function Tests:  Recent Labs Lab  06/29/14 0642 06/30/14 0528  AST 61*  --   ALT 56*  --   ALKPHOS 87  --   BILITOT 0.6  --   PROT 7.1  --   ALBUMIN 3.1* 2.5*   CBC:  Recent Labs Lab 06/25/14 1104 06/26/14 0405 06/29/14 0642 06/29/14 0656 06/30/14 0116  WBC 5.3 5.4 5.1  --  3.9*  NEUTROABS  --   --  3.5  --  2.0  HGB 8.1* 7.9* 8.6* 12.2 7.3*  HCT 25.1* 25.1* 26.7* 36.0 23.4*  MCV 85.4 86.6 84.0  --  84.2  PLT 217 187 259  --  246   Blood Culture    Component Value Date/Time   SDES BLOOD LEFT FOREARM 06/30/2014 1100   SPECREQUEST BOTTLES DRAWN AEROBIC AND ANAEROBIC 10CC 06/30/2014 1100   CULT  06/30/2014 1100           BLOOD CULTURE RECEIVED NO GROWTH TO DATE CULTURE WILL BE HELD FOR 5 DAYS BEFORE ISSUING A FINAL NEGATIVE REPORT Note: Culture results may be compromised due to an excessive volume of blood received in culture bottles. Performed at [facility] PENDING 06/30/2014 1100  Cardiac Enzymes:  Recent Labs Lab 06/25/14 1104 06/25/14 1714 06/26/14 0003 06/29/14 1439  TROPONINI 0.05* 0.06* 0.10* 0.09*  Studies/Results: Dg Chest 2 View  06/29/2014   CLINICAL DATA:  Fever x3 days  EXAM: CHEST  2 VIEW  COMPARISON:  06/29/2014  FINDINGS: Cardiomegaly with mild interstitial edema.  Superimposed left upper and lower lobe pneumonia is not excluded.  Small bilateral pleural effusions, left greater than right.  No pneumothorax.  IMPRESSION: Cardiomegaly with mild interstitial edema.  Small bilateral pleural effusions, left greater than right.  Superimposed left upper and lower lobe pneumonia is not excluded.   Electronically Signed   By: Julian Hy M.D.   On: 06/29/2014 16:53   Dg Chest Portable 1 View  06/30/2014   CLINICAL DATA:  Headache.  EXAM: PORTABLE CHEST - 1 VIEW  COMPARISON:  Frontal and lateral views 1 day prior at 1507 hour. Chest CT 5 days prior 06/25/2014  FINDINGS: The heart remains enlarged. Bilateral pleural effusions, not significantly changed. Mild  pulmonary edema, unchanged from prior. Improved left suprahilar aeration. No pneumothorax.  IMPRESSION: Unchanged cardiomegaly, mild pulmonary edema, and bilateral pleural effusions. Improved left perihilar aeration.   Electronically Signed   By: Jeb Levering M.D.   On: 06/30/2014 02:01   Medications:   . acetaminophen      . calcium acetate  2,001 mg Oral TID WC  . carvedilol  6.25 mg Oral BID WC  . colchicine  0.6 mg Oral QODAY  . doxercalciferol  4 mcg Intravenous Q M,W,F-HD  . haloperidol  5-10 mg Oral BID  . heparin  5,000 Units Subcutaneous 3 times per day  . hydrALAZINE  25 mg Oral 3 times per day  . isosorbide mononitrate  30 mg Oral Daily  . piperacillin-tazobactam (ZOSYN)  IV  2.25 g Intravenous Q8H  . sodium chloride  3 mL Intravenous Q12H  . vancomycin  750 mg Intravenous Once

## 2014-07-01 NOTE — Discharge Instructions (Signed)
Dialysis °Dialysis is a procedure that replaces some of the work healthy kidneys do. It is done when you lose about 85-90% of your kidney function. It may also be done earlier if your symptoms may be improved by dialysis. During dialysis, wastes, salt, and extra water are removed from the blood, and the levels of certain chemicals in the blood (such as potassium) are maintained. Dialysis is done in sessions. Dialysis sessions are continued until the kidneys get better. If the kidneys cannot get better, such as in end-stage kidney disease, dialysis is continued for life or until you receive a new kidney (kidney transplant). There are two types of dialysis: hemodialysis and peritoneal dialysis. °WHAT IS HEMODIALYSIS?  °Hemodialysis is a type of dialysis in which a machine called a dialyzer is used to filter the blood. Before beginning hemodialysis, you will have surgery to create a site where blood can be removed from the body and returned to the body (vascular access). There are three types of vascular accesses: °· Arteriovenous fistula. To create this type of access, an artery is connected to a vein (usually in the arm). A fistula takes 1-6 months to develop after surgery. If it develops properly, it usually lasts longer than the other types of vascular accesses. It is also less likely to become infected and cause blood clots. °· Arteriovenous graft. To create this type of access, an artery and a vein in the arm are connected with a tube. A graft may be used within 2-3 weeks of surgery. °· A venous catheter. To create this type of access, a thin, flexible tube (catheter) is placed in a large vein in your neck, chest, or groin. A catheter may be used right away. It is usually used as a temporary access when dialysis needs to begin immediately. °During hemodialysis, blood leaves the body through your access. It travels through a tube to the dialyzer, where it is filtered. The blood then returns to your body through  another tube. °Hemodialysis is usually performed by a health care provider at a hospital or dialysis center three times a week. Visits last about 3-4 hours. It may also be performed with the help of another person at home with training.  °WHAT IS PERITONEAL DIALYSIS? °Peritoneal dialysis is a type of dialysis in which the thin lining of the abdomen (peritoneum) is used as a filter. Before beginning peritoneal dialysis, you will have surgery to place a catheter in your abdomen. The catheter will be used to transfer a fluid called dialysate to and from your abdomen. At the start of a session, your abdomen is filled with dialysate. During the session, wastes, salt, and extra water in the blood pass through the peritoneum and into the dialysate. The dialysate is drained from the body at the end of the session. The process of filling and draining the dialysate is called an exchange. Exchanges are repeated until you have used up all the dialysate for the day. °Peritoneal dialysis may be performed by you at home or at almost any other location. It is done every day. You may need up to five exchanges a day. The amount of time the dialysate is in your body between exchanges is called a dwell. The dwell depends on the number of exchanges needed and the characteristics of the peritoneum. It usually varies from 1.5-3 hours. You may go about your day normally between exchanges. Alternately, the exchanges may be done at night while you sleep, using a machine called a cycler. °WHICH TYPE   while you sleep, using a machine called a cycler.  WHICH TYPE OF DIALYSIS SHOULD I CHOOSE?   Both hemodialysis and peritoneal dialysis have advantages and disadvantages. Talk to your health care provider about which type of dialysis would be best for you. Your lifestyle and preferences should be considered along with your medical condition. In some cases, only one type of dialysis may be an option.   Advantages of hemodialysis  · It is done less often than peritoneal dialysis.  · Someone else can do the  dialysis for you.  · If you go to a dialysis center, your health care provider will be able to recognize any problems right away.  · If you go to a dialysis center, you can interact with others who are having dialysis. This can provide you with emotional support.  Disadvantages of hemodialysis  · Hemodialysis may cause cramps and low blood pressure. It may leave you feeling tired on the days you have the treatment.  · If you go to a dialysis center, you will need to make weekly appointments and work around the center's schedule.  · You will need to take extra care when traveling. If you go to a dialysis center, you will need to make special arrangements to visit a dialysis center near your destination. If you are having treatments at home, you will need to take the dialyzer with you to your destination.  · You will need to avoid more foods than you would need to avoid on peritoneal dialysis.  Advantages of peritoneal dialysis  · It is less likely than hemodialysis to cause cramps and low blood pressure.  · You may do exchanges on your own wherever you are, including when you travel.  · You do not need to avoid as many foods as you do on hemodialysis.  Disadvantages of peritoneal dialysis  · It is done more often than hemodialysis.  · Performing peritoneal dialysis requires you to have dexterity of the hands. You must also be able to lift bags.  · You will have to learn sterilization techniques. You will need to practice them every day to reduce the risk of infection.   WHAT CHANGES WILL I NEED TO MAKE TO MY DIET DURING DIALYSIS?  Both hemodialysis and peritoneal dialysis require you to make some changes to your diet. For example, you will need to limit your intake of foods high in the minerals phosphorus and potassium. You will also need to limit your fluid intake. Your dietitian can help you plan meals. A good meal plan can improve your dialysis and your health.   WHAT SHOULD I EXPECT WHEN BEGINNING  DIALYSIS?  Adjusting to the dialysis treatment, schedule, and diet can take some time. You may need to stop working and may not be able to do some of the things you normally do. You may feel anxious or depressed when beginning dialysis. Eventually, many people feel better overall because of dialysis. Some people are able to return to work after making some changes, such as reducing work intensity.  WHERE CAN I FIND MORE INFORMATION?   · National Kidney Foundation: www.kidney.org  · American Association of Kidney Patients: www.aakp.org  · American Kidney Fund: www.kidneyfund.org  Document Released: 06/14/2002 Document Revised: 08/08/2013 Document Reviewed: 05/18/2012  ExitCare® Patient Information ©2015 [employer]. This information is not intended to replace advice given to you by your health care provider. Make sure you discuss any questions you have with your health care provider.

## 2014-07-03 ENCOUNTER — Emergency Department (HOSPITAL_COMMUNITY): Payer: Medicare Other

## 2014-07-03 ENCOUNTER — Encounter (HOSPITAL_COMMUNITY): Payer: Self-pay | Admitting: Emergency Medicine

## 2014-07-03 ENCOUNTER — Non-Acute Institutional Stay (HOSPITAL_COMMUNITY)
Admission: EM | Admit: 2014-07-03 | Discharge: 2014-07-03 | Discharge status: Against Medical Advice | Payer: Medicare Other | Attending: Family Medicine | Admitting: Family Medicine

## 2014-07-03 DIAGNOSIS — N179 Acute kidney failure, unspecified: Secondary | ICD-10-CM | POA: Diagnosis present

## 2014-07-03 DIAGNOSIS — F17210 Nicotine dependence, cigarettes, uncomplicated: Secondary | ICD-10-CM | POA: Diagnosis not present

## 2014-07-03 DIAGNOSIS — I120 Hypertensive chronic kidney disease with stage 5 chronic kidney disease or end stage renal disease: Secondary | ICD-10-CM | POA: Diagnosis not present

## 2014-07-03 DIAGNOSIS — M329 Systemic lupus erythematosus, unspecified: Secondary | ICD-10-CM | POA: Diagnosis not present

## 2014-07-03 DIAGNOSIS — Z79899 Other long term (current) drug therapy: Secondary | ICD-10-CM | POA: Insufficient documentation

## 2014-07-03 DIAGNOSIS — F209 Schizophrenia, unspecified: Secondary | ICD-10-CM | POA: Insufficient documentation

## 2014-07-03 DIAGNOSIS — F319 Bipolar disorder, unspecified: Secondary | ICD-10-CM | POA: Diagnosis not present

## 2014-07-03 DIAGNOSIS — M3214 Glomerular disease in systemic lupus erythematosus: Secondary | ICD-10-CM | POA: Diagnosis not present

## 2014-07-03 DIAGNOSIS — J90 Pleural effusion, not elsewhere classified: Secondary | ICD-10-CM | POA: Insufficient documentation

## 2014-07-03 DIAGNOSIS — Z992 Dependence on renal dialysis: Secondary | ICD-10-CM

## 2014-07-03 DIAGNOSIS — Z791 Long term (current) use of non-steroidal anti-inflammatories (NSAID): Secondary | ICD-10-CM | POA: Insufficient documentation

## 2014-07-03 DIAGNOSIS — N186 End stage renal disease: Secondary | ICD-10-CM | POA: Diagnosis not present

## 2014-07-03 LAB — CBC WITH DIFFERENTIAL/PLATELET
BASOS ABS: 0.1 10*3/uL (ref 0.0–0.1)
Basophils Relative: 1 % (ref 0–1)
EOS PCT: 4 % (ref 0–5)
Eosinophils Absolute: 0.2 10*3/uL (ref 0.0–0.7)
HEMATOCRIT: 23.4 % — AB (ref 36.0–46.0)
Hemoglobin: 7.2 g/dL — ABNORMAL LOW (ref 12.0–15.0)
Lymphocytes Relative: 38 % (ref 12–46)
Lymphs Abs: 1.9 10*3/uL (ref 0.7–4.0)
MCH: 26.8 pg (ref 26.0–34.0)
MCHC: 30.8 g/dL (ref 30.0–36.0)
MCV: 87 fL (ref 78.0–100.0)
MONO ABS: 0.6 10*3/uL (ref 0.1–1.0)
Monocytes Relative: 11 % (ref 3–12)
Neutro Abs: 2.2 10*3/uL (ref 1.7–7.7)
Neutrophils Relative %: 46 % (ref 43–77)
PLATELETS: 338 10*3/uL (ref 150–400)
RBC: 2.69 MIL/uL — ABNORMAL LOW (ref 3.87–5.11)
RDW: 20.9 % — AB (ref 11.5–15.5)
WBC: 4.9 10*3/uL (ref 4.0–10.5)

## 2014-07-03 LAB — BASIC METABOLIC PANEL
Anion gap: 11 (ref 5–15)
BUN: 29 mg/dL — AB (ref 6–23)
CALCIUM: 9 mg/dL (ref 8.4–10.5)
CO2: 26 mmol/L (ref 19–32)
Chloride: 103 mmol/L (ref 96–112)
Creatinine, Ser: 7.7 mg/dL — ABNORMAL HIGH (ref 0.50–1.10)
GFR calc Af Amer: 7 mL/min — ABNORMAL LOW (ref >90)
GFR, EST NON AFRICAN AMERICAN: 6 mL/min — AB (ref >90)
Glucose, Bld: 81 mg/dL (ref 70–99)
Potassium: 3.8 mmol/L (ref 3.5–5.1)
Sodium: 140 mmol/L (ref 135–145)

## 2014-07-03 MED ORDER — SODIUM CHLORIDE 0.9 % IV SOLN: 100.0000 mL | INTRAVENOUS | Status: DC | PRN

## 2014-07-03 MED ORDER — HEPARIN SODIUM (PORCINE) 1000 UNIT/ML DIALYSIS: 4000.0000 [IU] | Freq: Once | INTRAMUSCULAR | Status: DC

## 2014-07-03 MED ORDER — ALTEPLASE 2 MG IJ SOLR: 2.0000 mg | Freq: Once | INTRAMUSCULAR | Status: DC | PRN

## 2014-07-03 MED ORDER — DOXERCALCIFEROL 4 MCG/2ML IV SOLN
INTRAVENOUS | Status: AC
  Administered 2014-07-03: 4 ug via INTRAVENOUS
  Filled 2014-07-03: qty 2

## 2014-07-03 MED ORDER — PENTAFLUOROPROP-TETRAFLUOROETH EX AERO: 1.0000 "application " | INHALATION_SPRAY | CUTANEOUS | Status: DC | PRN

## 2014-07-03 MED ORDER — NEPRO/CARBSTEADY PO LIQD: 237.0000 mL | ORAL | Status: DC | PRN

## 2014-07-03 MED ORDER — LIDOCAINE-PRILOCAINE 2.5-2.5 % EX CREA
1.0000 "application " | TOPICAL_CREAM | CUTANEOUS | Status: DC | PRN
  Filled 2014-07-03: qty 5

## 2014-07-03 MED ORDER — ACETAMINOPHEN 325 MG PO TABS
ORAL_TABLET | ORAL | Status: AC
  Filled 2014-07-03: qty 2

## 2014-07-03 MED ORDER — ACETAMINOPHEN 650 MG RE SUPP: 650.0000 mg | Freq: Four times a day (QID) | RECTAL | Status: DC | PRN

## 2014-07-03 MED ORDER — ACETAMINOPHEN 325 MG PO TABS
650.0000 mg | ORAL_TABLET | Freq: Four times a day (QID) | ORAL | Status: DC | PRN
  Administered 2014-07-03: 650 mg via ORAL

## 2014-07-03 MED ORDER — LEVOFLOXACIN 500 MG PO TABS
500.0000 mg | ORAL_TABLET | Freq: Every day | ORAL | Status: AC
  Administered 2014-07-03: 500 mg via ORAL
  Filled 2014-07-03 (×2): qty 1

## 2014-07-03 MED ORDER — HEPARIN SODIUM (PORCINE) 1000 UNIT/ML DIALYSIS: 1000.0000 [IU] | INTRAMUSCULAR | Status: DC | PRN

## 2014-07-03 MED ORDER — DOXERCALCIFEROL 4 MCG/2ML IV SOLN
4.0000 ug | INTRAVENOUS | Status: DC
  Administered 2014-07-03: 4 ug via INTRAVENOUS
  Filled 2014-07-03: qty 2

## 2014-07-03 MED ORDER — NEPRO/CARBSTEADY PO LIQD
237.0000 mL | ORAL | Status: DC | PRN
  Filled 2014-07-03: qty 237

## 2014-07-03 MED ORDER — LIDOCAINE HCL (PF) 1 % IJ SOLN: 5.0000 mL | INTRAMUSCULAR | Status: DC | PRN

## 2014-07-03 MED ORDER — LIDOCAINE-PRILOCAINE 2.5-2.5 % EX CREA
1.0000 | TOPICAL_CREAM | CUTANEOUS | Status: DC | PRN
Start: 2014-07-03 — End: 2014-07-03

## 2014-07-03 MED ORDER — HEPARIN SODIUM (PORCINE) 1000 UNIT/ML DIALYSIS
100.0000 [IU]/kg | INTRAMUSCULAR | Status: DC | PRN
  Filled 2014-07-03: qty 7

## 2014-07-03 NOTE — Progress Notes (Signed)
Pt with ESRD and no home dialysis unit due to prior behavioral issues.  Hgb down to 7.2 - had Aranesp 200 3/26 - tsat earlier this month - on Fe. ^ volume on exam - large IDWG - K today only 3.8 - which is a little suprising - use 3 K bath - pt left without getting d/c antibiotics for recent HCAP. Have d/w Resident. Plan one dose po levaquin today post HD and then he will call in second dose to her drugstore to take on Wednesday. Lungs: crackles at bases. Heart RRR tachy - + LE and some UE edema. Right AVF patent  Amalia Hailey, PA-C

## 2014-07-03 NOTE — ED Notes (Signed)
Pt reports she is here to dialyze, pt reports she dialyzes M,W,F. Last dialysis Saturday. Pt reports she is unable to go to the dialysis center that she was going to anymore.

## 2014-07-03 NOTE — Procedures (Signed)
Patient was seen on dialysis and the procedure was supervised.  BFR 400  Via AVF BP is  152/88.   Patient appears to be tolerating treatment well- receives HD here in hospital  due to being discharged from [facility] due to unsafe behavior- UF as able  Sara [name] A 07/03/2014
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
## 2014-07-03 NOTE — Progress Notes (Addendum)
Hemodialysis: Patient signed out earlier from her treatment. Treatment was carried out without any complications.  All vital signs within normal limits. MD. Will be notified.

## 2014-07-05 NOTE — Discharge Summary (Signed)
[facility] Discharge Summary  Patient name: Sara [name] Medical record number: [id number] Date of birth: 08-25-1980 Age: 34 y.o. Gender: female Date of Admission: 06/30/2014  Date of Discharge: 07/01/14 Admitting Physician: Kinnie Feil, MD  Primary Care Provider: Tommi Rumps, MD Consultants: nephrology  Indication for Hospitalization: Volume overload and healthcare associated pneumonia  Discharge Diagnoses/Problem List:  Volume overload, ESRD Healthcare associated pneumonia Chronic systolic CHF Hypertension Anemia of chronic renal disease Bipolar, schizophrenia  Disposition: Home  Discharge Condition: Stable  Brief Hospital Course:  34 year old female presented with cough and dyspnea after leaving AMA the day prior (3/24)from the hospital. Patient had been suspected for volume overload and pneumonia during her prior admission however was unable to be treated for these conditions due to her premature exiting. Patient had received HD on Monday.  Nephrology was consulted on admission. Hemodialysis was performed at following morning. Patient was also started on a comparison and Zosyn per pharmacy and blood cultures were obtained (NGTD). Troponins were mildly elevated on 3/24 (0.09) was deemed likely secondary to her end-stage renal disease. BNP found to be >4500 also likely secondary to ESRD.  Patient underwent hemodialysis on 3/26. At that time she removed her peripheral IV site on her own, was provided her daily medication use, and asked to sign AMA paperwork. Patient left AMA prior to completion of discharge instructions or orders by the primary team.  Issues for Follow Up:  1. Establishing outpatient hemodialysis site. 2. Medication compliance and compliance with hospital admission protocol. 3. Psychiatric care  Significant Procedures: Dialysis  Significant Labs and Imaging:   Recent Labs Lab 06/29/14 0642 06/29/14 0656  06/30/14 0116 07/03/14 0710  WBC 5.1  --  3.9* 4.9  HGB 8.6* 12.2 7.3* 7.2*  HCT 26.7* 36.0 23.4* 23.4*  PLT 259  --  246 338    Recent Labs Lab 06/29/14 0642 06/29/14 0656 06/30/14 0116 06/30/14 0528 07/03/14 0710  NA 139 138 137 141 140  K 4.5 4.5 3.6 4.0 3.8  CL 102 102 99 102 103  CO2 22  --  28 26 26   GLUCOSE 98 94 104* 92 81  BUN 67* 66* 31* 31* 29*  CREATININE 12.43* 12.30* 7.75* 7.96* 7.70*  CALCIUM 9.1  --  8.7 8.6 9.0  MG  --   --  2.1  --   --   PHOS  --   --   --  5.8*  --   ALKPHOS 87  --   --   --   --   AST 61*  --   --   --   --   ALT 56*  --   --   --   --   ALBUMIN 3.1*  --   --  2.5*  --     Results/Tests Pending at Time of Discharge: Blood cultures: No growth to date  Discharge Medications:    Medication List    STOP taking these medications        metoprolol tartrate 25 MG tablet  Commonly known as:  LOPRESSOR      TAKE these medications        acetaminophen 500 MG tablet  Commonly known as:  TYLENOL  Take 1,000 mg by mouth every 6 (six) hours as needed for moderate pain.     calcium acetate 667 MG capsule  Commonly known as:  PHOSLO  Take 667-2,001 mg by mouth 4 (four) times daily. Take 2001 mg with breakfast, lunch  and dinner, and 667 mg with a snack.     carvedilol 6.25 MG tablet  Commonly known as:  COREG  Take 1 tablet (6.25 mg total) by mouth 2 (two) times daily with a meal.     colchicine 0.6 MG tablet  Take 1 tablet (0.6 mg total) by mouth every other day.     haloperidol 5 MG tablet  Commonly known as:  HALDOL  Take 1 tablet (5 mg total) by mouth 2 (two) times daily.     hydrALAZINE 25 MG tablet  Commonly known as:  APRESOLINE  TAKE 1 TABLET (25 MG TOTAL) BY MOUTH 3 (THREE) TIMES DAILY.     ibuprofen 200 MG tablet  Commonly known as:  ADVIL  Take 1 tablet (200 mg total) by mouth 3 (three) times daily.     isosorbide mononitrate 30 MG 24 hr tablet  Commonly known as:  IMDUR  TAKE 1 TABLET (30 MG TOTAL) BY MOUTH  DAILY.        Discharge Instructions: Please refer to Patient Instructions section of EMR for full details.  Patient was counseled important signs and symptoms that should prompt return to medical care, changes in medications, dietary instructions, activity restrictions, and follow up appointments.   Follow-Up Appointments:     Follow-up Information    Follow up with Tommi Rumps, MD. Schedule an appointment as soon as possible for a visit in 1 week.   Specialty:  Family Medicine   Contact information:   [place] [address] [phone]       Elberta Leatherwood, MD 07/05/2014, 6:23 PM PGY-1, [place]

## 2014-07-06 LAB — CULTURE, BLOOD (ROUTINE X 2)
CULTURE: NO GROWTH
Culture: NO GROWTH

## 2014-07-07 ENCOUNTER — Encounter (HOSPITAL_COMMUNITY): Payer: Self-pay | Admitting: Emergency Medicine

## 2014-07-07 ENCOUNTER — Emergency Department (HOSPITAL_COMMUNITY)
Admission: EM | Admit: 2014-07-07 | Discharge: 2014-07-07 | Discharge status: Against Medical Advice | Payer: Medicare Other | Attending: Emergency Medicine | Admitting: Emergency Medicine

## 2014-07-07 ENCOUNTER — Encounter: Payer: Self-pay | Admitting: Nurse Practitioner

## 2014-07-07 DIAGNOSIS — Z992 Dependence on renal dialysis: Secondary | ICD-10-CM | POA: Insufficient documentation

## 2014-07-07 DIAGNOSIS — Z4931 Encounter for adequacy testing for hemodialysis: Secondary | ICD-10-CM | POA: Diagnosis present

## 2014-07-07 DIAGNOSIS — N186 End stage renal disease: Secondary | ICD-10-CM | POA: Insufficient documentation

## 2014-07-07 DIAGNOSIS — I120 Hypertensive chronic kidney disease with stage 5 chronic kidney disease or end stage renal disease: Secondary | ICD-10-CM | POA: Diagnosis not present

## 2014-07-07 NOTE — ED Provider Notes (Signed)
10:59 AM Patient left without being seen prior to my evaluation.  Did not notify staff.  Hyman Bible, PA-C 07/07/14 [place], MD 07/07/14 [phone]

## 2014-07-07 NOTE — ED Notes (Signed)
Patient M, W, F dialysis.   Patient states needs to get dialysis today.  Patient states has been having trouble with dialysis center and can't go back there.

## 2014-07-07 NOTE — ED Notes (Signed)
Patient states she was leaving because dialysis told her they couldn't take her for 2 hours.

## 2014-07-09 ENCOUNTER — Emergency Department (HOSPITAL_COMMUNITY)
Admission: EM | Admit: 2014-07-09 | Discharge: 2014-07-09 | Disposition: A | Discharge status: Home / Self Care | Payer: Medicare Other | Attending: Emergency Medicine | Admitting: Emergency Medicine

## 2014-07-09 ENCOUNTER — Emergency Department (HOSPITAL_COMMUNITY): Payer: Medicare Other

## 2014-07-09 ENCOUNTER — Encounter (HOSPITAL_COMMUNITY): Payer: Self-pay | Admitting: *Deleted

## 2014-07-09 DIAGNOSIS — Z8659 Personal history of other mental and behavioral disorders: Secondary | ICD-10-CM | POA: Diagnosis not present

## 2014-07-09 DIAGNOSIS — Z791 Long term (current) use of non-steroidal anti-inflammatories (NSAID): Secondary | ICD-10-CM | POA: Insufficient documentation

## 2014-07-09 DIAGNOSIS — Z79899 Other long term (current) drug therapy: Secondary | ICD-10-CM | POA: Insufficient documentation

## 2014-07-09 DIAGNOSIS — Z8739 Personal history of other diseases of the musculoskeletal system and connective tissue: Secondary | ICD-10-CM | POA: Diagnosis not present

## 2014-07-09 DIAGNOSIS — Z9115 Patient's noncompliance with renal dialysis: Secondary | ICD-10-CM | POA: Insufficient documentation

## 2014-07-09 DIAGNOSIS — I120 Hypertensive chronic kidney disease with stage 5 chronic kidney disease or end stage renal disease: Secondary | ICD-10-CM | POA: Diagnosis not present

## 2014-07-09 DIAGNOSIS — Z862 Personal history of diseases of the blood and blood-forming organs and certain disorders involving the immune mechanism: Secondary | ICD-10-CM | POA: Insufficient documentation

## 2014-07-09 DIAGNOSIS — N186 End stage renal disease: Secondary | ICD-10-CM | POA: Insufficient documentation

## 2014-07-09 DIAGNOSIS — Z720 Tobacco use: Secondary | ICD-10-CM | POA: Diagnosis not present

## 2014-07-09 DIAGNOSIS — R05 Cough: Secondary | ICD-10-CM | POA: Diagnosis not present

## 2014-07-09 LAB — CBC WITH DIFFERENTIAL/PLATELET
BASOS PCT: 1 % (ref 0–1)
Basophils Absolute: 0.1 10*3/uL (ref 0.0–0.1)
Eosinophils Absolute: 0.2 10*3/uL (ref 0.0–0.7)
Eosinophils Relative: 4 % (ref 0–5)
HEMATOCRIT: 22.7 % — AB (ref 36.0–46.0)
Hemoglobin: 7.2 g/dL — ABNORMAL LOW (ref 12.0–15.0)
Lymphocytes Relative: 23 % (ref 12–46)
Lymphs Abs: 1.4 10*3/uL (ref 0.7–4.0)
MCH: 27.1 pg (ref 26.0–34.0)
MCHC: 31.7 g/dL (ref 30.0–36.0)
MCV: 85.3 fL (ref 78.0–100.0)
MONO ABS: 0.7 10*3/uL (ref 0.1–1.0)
Monocytes Relative: 11 % (ref 3–12)
NEUTROS ABS: 3.6 10*3/uL (ref 1.7–7.7)
NEUTROS PCT: 61 % (ref 43–77)
Platelets: 369 10*3/uL (ref 150–400)
RBC: 2.66 MIL/uL — ABNORMAL LOW (ref 3.87–5.11)
RDW: 22.6 % — AB (ref 11.5–15.5)
WBC: 6 10*3/uL (ref 4.0–10.5)

## 2014-07-09 LAB — COMPREHENSIVE METABOLIC PANEL
ALT: 33 U/L (ref 0–35)
ANION GAP: 18 — AB (ref 5–15)
AST: 44 U/L — AB (ref 0–37)
Albumin: 2.8 g/dL — ABNORMAL LOW (ref 3.5–5.2)
Alkaline Phosphatase: 93 U/L (ref 39–117)
BUN: 89 mg/dL — AB (ref 6–23)
CHLORIDE: 99 mmol/L (ref 96–112)
CO2: 21 mmol/L (ref 19–32)
Calcium: 8.9 mg/dL (ref 8.4–10.5)
Creatinine, Ser: 14.95 mg/dL — ABNORMAL HIGH (ref 0.50–1.10)
GFR calc Af Amer: 3 mL/min — ABNORMAL LOW (ref >90)
GFR calc non Af Amer: 3 mL/min — ABNORMAL LOW (ref >90)
GLUCOSE: 111 mg/dL — AB (ref 70–99)
Potassium: 4.9 mmol/L (ref 3.5–5.1)
SODIUM: 138 mmol/L (ref 135–145)
Total Bilirubin: 0.5 mg/dL (ref 0.3–1.2)
Total Protein: 6.7 g/dL (ref 6.0–8.3)

## 2014-07-09 MED ORDER — ACETAMINOPHEN 325 MG PO TABS
325.0000 mg | ORAL_TABLET | Freq: Once | ORAL | Status: AC
  Administered 2014-07-09: 650 mg via ORAL
  Filled 2014-07-09: qty 1

## 2014-07-09 NOTE — Discharge Instructions (Signed)
You do not need emergent dialysis today. Your labs, chest x-ray, EKG and vital signs have been reassuring. We have discussed your case with the nephrologist on call who recommends you go to your regularly scheduled dialysis appointment on Monday.   End-Stage Kidney Disease The kidneys are two organs that lie on either side of the spine between the middle of the back and the front of the abdomen. The kidneys:   Remove wastes and extra water from the blood.   Produce important hormones. These help keep bones strong, regulate blood pressure, and help create red blood cells.   Balance the fluids and chemicals in the blood and tissues. End-stage kidney disease occurs when the kidneys are so damaged that they cannot do their job. When the kidneys cannot do their job, life-threatening problems occur. The body cannot stay clean and strong without the help of the kidneys. In end-stage kidney disease, the kidneys cannot get better.You need a new kidney or treatments to do some of the work healthy kidneys do in order to stay alive. CAUSES  End-stage kidney disease usually occurs when a long-lasting (chronic) kidney disease gets worse. It may also occur after the kidneys are suddenly damaged (acute kidney injury).  SYMPTOMS   Swelling (edema) of the legs, ankles, or feet.   Tiredness (lethargy).   Nausea or vomiting.   Confusion.   Problems with urination, such as:   Decreased urine production.   Frequent urination, especially at night.   Frequent accidents in children who are potty trained.   Muscle twitches and cramps.   Persistent itchiness.   Loss of appetite.   Headaches.   Abnormally dark or light skin.   Numbness in the hands or feet.   Easy bruising.   Frequent hiccups.   Menstruation stops. DIAGNOSIS  Your health care provider will measure your blood pressure and take some tests. These may include:   Urine tests.   Blood tests.   Imaging  tests, such as:   An ultrasound exam.   Computed tomography (CT).  A kidney biopsy. TREATMENT  There are two treatments for end-stage kidney disease:   A procedure that removes toxic wastes from the body (dialysis).   Receiving a new kidney (kidney transplant). Both of these treatments have serious risks and consequences. Your health care provider will help you determine which treatment is best for you based on your health, age, and other factors. In addition to having dialysis or a kidney transplant, you may need to take medicines to control high blood pressure (hypertension) and cholesterol and to decrease phosphorus levels in your blood.  HOME CARE INSTRUCTIONS  Follow your prescribed diet.   Take medicines only as directed by your health care provider.   Do not take any new medicines (prescription, over-the-counter, or nutritional supplements) unless approved by your health care provider. Many medicines can worsen your kidney damage or need to have the dose adjusted.   Keep all follow-up visits as directed by your health care provider. MAKE SURE YOU:  Understand these instructions.  Will watch your condition.  Will get help right away if you are not doing well or get worse. Document Released: 06/14/2003 Document Revised: 08/08/2013 Document Reviewed: 11/21/2011 [facility] Patient Information 2015 [place], [place]. This information is not intended to replace advice given to you by your health care provider. Make sure you discuss any questions you have with your health care provider.

## 2014-07-09 NOTE — ED Provider Notes (Signed)
TIME SEEN: 4:40 AM  CHIEF COMPLAINT: "I need emergent dialysis"  HPI: Patient is a 34 year old female with history of hypertension, lupus and lupus nephritis with end-stage renal disease requiring hemodialysis on Monday, Wednesday and Friday, schizophrenia, bipolar disorder and medical noncompliance who presents to the emergency department requesting emergent dialysis. Reports that she last went to dialysis on Monday, March 28. Missed dialysis on Wednesday and Friday because of lack of transportation. She has had 2 days of productive cough and swelling to her bilateral lower extremities but denies chest pain, headache, fever, shortness of breath, vomiting or diarrhea. She states that she is seen the [facility]. Recently admitted to family medicine service on 06/30/14 and was recently treated for pneumonia. Is no longer on antibiotics.  ROS: See HPI Constitutional: no fever  Eyes: no drainage  ENT: no runny nose   Cardiovascular:  no chest pain  Resp: no SOB  GI: no vomiting GU: no dysuria Integumentary: no rash  Allergy: no hives  Musculoskeletal: no leg swelling  Neurological: no slurred speech ROS otherwise negative  PAST MEDICAL HISTORY/PAST SURGICAL HISTORY:  Past Medical History  Diagnosis Date  . Psychosis   . Hypertension   . Lupus   . Lupus nephritis   . Bipolar 1 disorder   . Schizophrenia   . Pregnancy   . ESRD (end stage renal disease)   . Anemia     MEDICATIONS:  Prior to Admission medications   Medication Sig Start Date End Date Taking? Authorizing Provider  acetaminophen (TYLENOL) 500 MG tablet Take 1,000 mg by mouth every 6 (six) hours as needed for moderate pain.    Historical Provider, MD  calcium acetate (PHOSLO) 667 MG capsule Take 667-2,001 mg by mouth 4 (four) times daily. Take 2001 mg with breakfast, lunch and dinner, and 667 mg with a snack.    Historical Provider, MD  carvedilol (COREG) 6.25 MG tablet Take 1 tablet (6.25 mg total) by mouth 2  (two) times daily with a meal. 07/01/14   Elberta Leatherwood, MD  colchicine 0.6 MG tablet Take 1 tablet (0.6 mg total) by mouth every other day. 05/25/14   Elberta Leatherwood, MD  haloperidol (HALDOL) 5 MG tablet Take 1 tablet (5 mg total) by mouth 2 (two) times daily. Patient taking differently: Take 5-10 mg by mouth 2 (two) times daily. 5mg  in morning, 10mg  in evening 06/12/14   Elberta Leatherwood, MD  hydrALAZINE (APRESOLINE) 25 MG tablet TAKE 1 TABLET (25 MG TOTAL) BY MOUTH 3 (THREE) TIMES DAILY. Patient not taking: Reported on 07/03/2014 06/19/14   Leone Haven, MD  hydrALAZINE (APRESOLINE) 25 MG tablet Take 25 mg by mouth 3 (three) times daily.    Historical Provider, MD  ibuprofen (ADVIL) 200 MG tablet Take 1 tablet (200 mg total) by mouth 3 (three) times daily. 05/25/14   Elberta Leatherwood, MD  isosorbide mononitrate (IMDUR) 30 MG 24 hr tablet TAKE 1 TABLET (30 MG TOTAL) BY MOUTH DAILY. Patient not taking: Reported on 07/03/2014 06/19/14   Leone Haven, MD  isosorbide mononitrate (IMDUR) 30 MG 24 hr tablet Take 30 mg by mouth daily.    Historical Provider, MD    ALLERGIES:  Allergies  Allergen Reactions  . Ativan [Lorazepam] Other (See Comments)    Dysarthria(patient has difficulty speaking and slurred speech); denies swelling, itching, pain, or numbness.  [name] [Ziprasidone Hcl] Itching and Swelling    Tongue swelling  . Keflex [Cephalexin] Swelling    Tongue  swelling  . Other Itching and Other (See Comments)    Wool causes itching    SOCIAL HISTORY:  History  Substance Use Topics  . Smoking status: Current Every Day Smoker -- 1.00 packs/day for 15 years    Types: Cigarettes  . Smokeless tobacco: Current User  . Alcohol Use: No     Comment: WEEKENDS- 02/21/14-denies that she has not used any etoh or drugs in over a year.    FAMILY HISTORY: Family History  Problem Relation Age of Onset  . Drug abuse Father   . Kidney disease Father     EXAM: BP 138/94 mmHg  Pulse 107  Temp(Src)  98.7 F (37.1 C)  Resp 23  SpO2 100% CONSTITUTIONAL: Alert and oriented and responds appropriately to questions. Well-appearing; well-nourished HEAD: Normocephalic EYES: Conjunctivae clear, PERRL ENT: normal nose; no rhinorrhea; moist mucous membranes; pharynx without lesions noted NECK: Supple, no meningismus, no LAD  CARD: Regular and tachycardic; S1 and S2 appreciated; no murmurs, no clicks, no rubs, no gallops RESP: Normal chest excursion without splinting or tachypnea; breath sounds clear and equal bilaterally; no wheezes, no rhonchi, no rales,  ABD/GI: Normal bowel sounds; non-distended; soft, non-tender, no rebound, no guarding BACK:  The back appears normal and is non-tender to palpation, there is no CVA tenderness EXT: Normal ROM in all joints; non-tender to palpation; bilateral nonpitting edema to her feet and ankles; normal capillary refill; no cyanosis    SKIN: Normal color for age and race; warm NEURO: Moves all extremities equally PSYCH: The patient's mood and manner are appropriate. Grooming and personal hygiene are appropriate.  MEDICAL DECISION MAKING: Patient here requesting dialysis. Her blood pressure has been normal, labs normal. EKG unchanged. Will obtain chest x-ray. She does have some swelling of her ankles but she reports is new. Also has some cough. Anticipate discussing with nephrology for further recommendations.  ED PROGRESS: 6:22 AM  D/w Dr. Lorrene Reid with nephrology. She states the patient does not meet criteria for emergent dialysis. There is no dialysis available on Sunday. She recommended the patient go to her regular scheduled appointment on Monday. We'll discharge patient home. Discussed this with patient who agrees with plan. Discussed return precautions. She verbalized understanding and is comfortable with this plan.    EKG Interpretation  Date/Time:  Sunday July 09 2014 04:41:35 EDT Ventricular Rate:  109 PR Interval:  143 QRS Duration: 80 QT  Interval:  365 QTC Calculation: 491 R Axis:   71 Text Interpretation:  Age not entered, assumed to be  34 years old for purpose of ECG interpretation Sinus tachycardia Ventricular premature complex Probable left atrial enlargement Borderline prolonged QT interval No significant change since last tracing Confirmed by Darrnell Mangiaracina,  DO, Jaheim Canino [phone]) on 07/09/2014 4:59:28 AM           [place], DO 07/09/14 [id number]

## 2014-07-09 NOTE — ED Notes (Signed)
The pt reports that she needs emergency dialysis she missed Wednesday and Friday and now she repoirts that she is aching all over.   Rt arm fistula.  lmp   none

## 2014-07-11 ENCOUNTER — Emergency Department (HOSPITAL_COMMUNITY)
Admission: EM | Admit: 2014-07-11 | Discharge: 2014-07-11 | Disposition: A | Discharge status: Home / Self Care | Payer: Medicare Other | Attending: Emergency Medicine | Admitting: Emergency Medicine

## 2014-07-11 ENCOUNTER — Encounter (HOSPITAL_COMMUNITY): Payer: Self-pay

## 2014-07-11 DIAGNOSIS — N186 End stage renal disease: Secondary | ICD-10-CM | POA: Insufficient documentation

## 2014-07-11 DIAGNOSIS — Z3202 Encounter for pregnancy test, result negative: Secondary | ICD-10-CM | POA: Diagnosis not present

## 2014-07-11 DIAGNOSIS — Z79899 Other long term (current) drug therapy: Secondary | ICD-10-CM | POA: Diagnosis not present

## 2014-07-11 DIAGNOSIS — Z720 Tobacco use: Secondary | ICD-10-CM | POA: Diagnosis not present

## 2014-07-11 DIAGNOSIS — Z048 Encounter for examination and observation for other specified reasons: Secondary | ICD-10-CM | POA: Diagnosis not present

## 2014-07-11 DIAGNOSIS — Z992 Dependence on renal dialysis: Secondary | ICD-10-CM | POA: Insufficient documentation

## 2014-07-11 DIAGNOSIS — E875 Hyperkalemia: Secondary | ICD-10-CM | POA: Insufficient documentation

## 2014-07-11 DIAGNOSIS — Z4902 Encounter for fitting and adjustment of peritoneal dialysis catheter: Secondary | ICD-10-CM | POA: Diagnosis present

## 2014-07-11 LAB — BASIC METABOLIC PANEL
ANION GAP: 23 — AB (ref 5–15)
BUN: 120 mg/dL — ABNORMAL HIGH (ref 6–23)
CO2: 14 mmol/L — AB (ref 19–32)
CREATININE: 17.72 mg/dL — AB (ref 0.50–1.10)
Calcium: 8.9 mg/dL (ref 8.4–10.5)
Chloride: 102 mmol/L (ref 96–112)
GFR calc Af Amer: 3 mL/min — ABNORMAL LOW (ref >90)
GFR calc non Af Amer: 2 mL/min — ABNORMAL LOW (ref >90)
GLUCOSE: 90 mg/dL (ref 70–99)
Potassium: 6.4 mmol/L (ref 3.5–5.1)
Sodium: 139 mmol/L (ref 135–145)

## 2014-07-11 LAB — CBC WITH DIFFERENTIAL/PLATELET
BASOS PCT: 1 % (ref 0–1)
Basophils Absolute: 0 10*3/uL (ref 0.0–0.1)
EOS ABS: 0.1 10*3/uL (ref 0.0–0.7)
Eosinophils Relative: 3 % (ref 0–5)
HEMATOCRIT: 21.9 % — AB (ref 36.0–46.0)
HEMOGLOBIN: 7.1 g/dL — AB (ref 12.0–15.0)
Lymphocytes Relative: 29 % (ref 12–46)
Lymphs Abs: 1.1 10*3/uL (ref 0.7–4.0)
MCH: 27.3 pg (ref 26.0–34.0)
MCHC: 32.4 g/dL (ref 30.0–36.0)
MCV: 84.2 fL (ref 78.0–100.0)
MONOS PCT: 11 % (ref 3–12)
Monocytes Absolute: 0.4 10*3/uL (ref 0.1–1.0)
Neutro Abs: 2.2 10*3/uL (ref 1.7–7.7)
Neutrophils Relative %: 56 % (ref 43–77)
Platelets: 318 10*3/uL (ref 150–400)
RBC: 2.6 MIL/uL — ABNORMAL LOW (ref 3.87–5.11)
RDW: 22.1 % — AB (ref 11.5–15.5)
WBC: 3.8 10*3/uL — AB (ref 4.0–10.5)

## 2014-07-11 LAB — HEPATITIS B SURFACE ANTIGEN: HEP B S AG: NEGATIVE

## 2014-07-11 LAB — POC URINE PREG, ED: PREG TEST UR: NEGATIVE

## 2014-07-11 MED ORDER — HYDRALAZINE HCL 20 MG/ML IJ SOLN
20.0000 mg | Freq: Once | INTRAMUSCULAR | Status: AC
  Administered 2014-07-11: 20 mg via INTRAMUSCULAR
  Filled 2014-07-11: qty 1

## 2014-07-11 MED ORDER — SODIUM CHLORIDE 0.9 % IV SOLN
1.0000 g | INTRAVENOUS | Status: DC
  Filled 2014-07-11: qty 10

## 2014-07-11 MED ORDER — DARBEPOETIN ALFA 200 MCG/0.4ML IJ SOSY
PREFILLED_SYRINGE | INTRAMUSCULAR | Status: AC
  Filled 2014-07-11: qty 0.4

## 2014-07-11 MED ORDER — DEXTROSE 50 % IV SOLN: 50.0000 mL | Freq: Once | INTRAVENOUS | Status: DC

## 2014-07-11 MED ORDER — INSULIN ASPART 100 UNIT/ML ~~LOC~~ SOLN: 10.0000 [IU] | Freq: Once | SUBCUTANEOUS | Status: DC

## 2014-07-11 MED ORDER — ACETAMINOPHEN 325 MG PO TABS
650.0000 mg | ORAL_TABLET | Freq: Once | ORAL | Status: AC
Start: 2014-07-11 — End: 2014-07-11
  Administered 2014-07-11: 650 mg via ORAL

## 2014-07-11 MED ORDER — ACETAMINOPHEN 325 MG PO TABS
ORAL_TABLET | ORAL | Status: AC
  Filled 2014-07-11: qty 2

## 2014-07-11 MED ORDER — DARBEPOETIN ALFA 200 MCG/0.4ML IJ SOSY
200.0000 ug | PREFILLED_SYRINGE | Freq: Once | INTRAMUSCULAR | Status: AC
  Administered 2014-07-11: 200 ug via INTRAVENOUS

## 2014-07-11 NOTE — ED Provider Notes (Signed)
CSN: [id number]     Arrival date & time 07/11/14  1247 History   First MD Initiated Contact with Patient 07/11/14 1504     Chief Complaint  Patient presents with  . Vascular Access Problem     (Consider location/radiation/quality/duration/timing/severity/associated sxs/prior Treatment) HPI Comments: Patient presents to the ER for evaluation of end-stage renal disease. Patient reports that she is currently on dialysis, but does not have a dialysis center. She reports that she is becoming to the ER here to receive dialysis intermittently. Patient has not taken her medications today. She is not experiencing any shortness of breath or chest pain.   Past Medical History  Diagnosis Date  . Renal disorder    History reviewed. No pertinent past surgical history. History reviewed. No pertinent family history. History  Substance Use Topics  . Smoking status: Current Every Day Smoker -- 1.00 packs/day    Types: Cigarettes  . Smokeless tobacco: Not on file  . Alcohol Use: No   OB History    No data available     Review of Systems  Respiratory: Negative for shortness of breath.   Cardiovascular: Negative for chest pain.  All other systems reviewed and are negative.     Allergies  Ativan; Geodon; Keflex; and Other  Home Medications   Prior to Admission medications   Medication Sig Start Date End Date Taking? Authorizing Provider  acetaminophen (TYLENOL) 500 MG tablet Take 500 mg by mouth every 6 (six) hours as needed for mild pain.   Yes Historical Provider, MD  calcium acetate (PHOSLO) 667 MG capsule Take 667 mg by mouth 3 (three) times daily with meals.   Yes Historical Provider, MD  carvedilol (COREG) 6.25 MG tablet Take 6.25 mg by mouth 2 (two) times daily with a meal.   Yes Historical Provider, MD  colchicine 0.6 MG tablet Take 0.6 mg by mouth every other day.   Yes Historical Provider, MD  haloperidol (HALDOL) 5 MG tablet Take 5 mg by mouth 2 (two) times daily.   Yes  Historical Provider, MD  hydrALAZINE (APRESOLINE) 25 MG tablet Take 25 mg by mouth 3 (three) times daily.   Yes Historical Provider, MD  ibuprofen (ADVIL,MOTRIN) 200 MG tablet Take 400 mg by mouth every 8 (eight) hours as needed for mild pain.   Yes Historical Provider, MD  isosorbide mononitrate (IMDUR) 30 MG 24 hr tablet Take 30 mg by mouth daily. 05/25/14  Yes Historical Provider, MD   BP 144/106 mmHg  Pulse 113  Temp(Src) 97.5 F (36.4 C) (Oral)  Resp 23  Ht 5\' 7"  (1.702 m)  Wt 179 lb 11.2 oz (81.511 kg)  BMI 28.14 kg/m2  SpO2 97% Physical Exam  Constitutional: She is oriented to person, place, and time. She appears well-developed and well-nourished. No distress.  HENT:  Head: Normocephalic and atraumatic.  Right Ear: Hearing normal.  Left Ear: Hearing normal.  Nose: Nose normal.  Mouth/Throat: Oropharynx is clear and moist and mucous membranes are normal.  Eyes: Conjunctivae and EOM are normal. Pupils are equal, round, and reactive to light.  Neck: Normal range of motion. Neck supple.  Cardiovascular: Regular rhythm, S1 normal and S2 normal.  Exam reveals no gallop and no friction rub.   No murmur heard. Pulmonary/Chest: Effort normal and breath sounds normal. No respiratory distress. She exhibits no tenderness.  Abdominal: Soft. Normal appearance and bowel sounds are normal. There is no hepatosplenomegaly. There is no tenderness. There is no rebound, no guarding, no tenderness at McBurney's  point and negative Murphy's sign. No hernia.  Musculoskeletal: Normal range of motion.  Neurological: She is alert and oriented to person, place, and time. She has normal strength. No cranial nerve deficit or sensory deficit. Coordination normal. GCS eye subscore is 4. GCS verbal subscore is 5. GCS motor subscore is 6.  Skin: Skin is warm, dry and intact. No rash noted. No cyanosis.  Psychiatric: She has a normal mood and affect. Her speech is normal and behavior is normal. Thought content  normal.  Nursing note and vitals reviewed.   ED Course  Procedures (including critical care time) Labs Review Labs Reviewed  CBC WITH DIFFERENTIAL/PLATELET - Abnormal; Notable for the following:    WBC 3.8 (*)    RBC 2.60 (*)    Hemoglobin 7.1 (*)    HCT 21.9 (*)    RDW 22.1 (*)    All other components within normal limits  BASIC METABOLIC PANEL - Abnormal; Notable for the following:    Potassium 6.4 (*)    CO2 14 (*)    BUN 120 (*)    Creatinine, Ser 17.72 (*)    GFR calc non Af Amer 2 (*)    GFR calc Af Amer 3 (*)    Anion gap 23 (*)    All other components within normal limits  POC URINE PREG, ED    Imaging Review No results found.   EKG Interpretation   Date/Time:  Tuesday July 11 2014 15:56:23 EDT Ventricular Rate:  105 PR Interval:  143 QRS Duration: 81 QT Interval:  362 QTC Calculation: 478 R Axis:   72 Text Interpretation:  Sinus tachycardia Premature ventricular complexes  Borderline prolonged QT interval Confirmed by Betsey Holiday  MD, Kaplan  [phone]) on 07/11/2014 4:59:16 PM      MDM   Final diagnoses:  ESRD (end stage renal disease) on dialysis  Hyperkalemia    Patient presents to the ER for evaluation of end-stage renal disease. Patient feels like she requires dialysis. Patient does require chronic dialysis, but has reportedly been dismissed by her dialysis center. She has been periodically coming to this hospital for dialysis while attempts are made to get her a permanent dialysis center. Patient is hypertensive at arrival, but has not taken any of her meds. This improved after IM hydralazine. She does not appear volume overloaded. She has declined a chest x-ray, but lungs are clear and oxygenation is normal. CBC reveals a chronic anemia at her baseline. She does have mild to moderate hyperkalemia, potassium is 6.4. Consultation to nephrology for dialysis.    Orpah Greek, MD 07/11/14 [phone]

## 2014-07-11 NOTE — ED Notes (Signed)
Pt here for dialysis, doesn't have current center.  Last dialysis 07-03-14.

## 2014-07-11 NOTE — ED Notes (Signed)
Pt refused to go for a chest Xray.

## 2014-07-11 NOTE — Progress Notes (Signed)
Treatment ended, patient tolerated it well, alert and oriented...vitals signs stable .Marland Kitchen Due to be discharged after dialysis escorted off unit by [facility]

## 2014-07-12 ENCOUNTER — Encounter (HOSPITAL_COMMUNITY): Payer: Self-pay | Admitting: Emergency Medicine

## 2014-07-12 ENCOUNTER — Emergency Department (HOSPITAL_COMMUNITY)
Admission: EM | Admit: 2014-07-12 | Discharge: 2014-07-12 | Disposition: A | Discharge status: Home / Self Care | Payer: Medicare Other | Attending: Emergency Medicine | Admitting: Emergency Medicine

## 2014-07-12 DIAGNOSIS — N186 End stage renal disease: Secondary | ICD-10-CM | POA: Insufficient documentation

## 2014-07-12 DIAGNOSIS — I120 Hypertensive chronic kidney disease with stage 5 chronic kidney disease or end stage renal disease: Secondary | ICD-10-CM | POA: Insufficient documentation

## 2014-07-12 DIAGNOSIS — R000 Tachycardia, unspecified: Secondary | ICD-10-CM | POA: Diagnosis not present

## 2014-07-12 DIAGNOSIS — Z79899 Other long term (current) drug therapy: Secondary | ICD-10-CM | POA: Diagnosis not present

## 2014-07-12 DIAGNOSIS — Z048 Encounter for examination and observation for other specified reasons: Secondary | ICD-10-CM | POA: Diagnosis present

## 2014-07-12 DIAGNOSIS — Z8739 Personal history of other diseases of the musculoskeletal system and connective tissue: Secondary | ICD-10-CM | POA: Diagnosis not present

## 2014-07-12 DIAGNOSIS — Z862 Personal history of diseases of the blood and blood-forming organs and certain disorders involving the immune mechanism: Secondary | ICD-10-CM | POA: Diagnosis not present

## 2014-07-12 DIAGNOSIS — Z992 Dependence on renal dialysis: Secondary | ICD-10-CM | POA: Insufficient documentation

## 2014-07-12 DIAGNOSIS — Z720 Tobacco use: Secondary | ICD-10-CM | POA: Diagnosis not present

## 2014-07-12 DIAGNOSIS — Z09 Encounter for follow-up examination after completed treatment for conditions other than malignant neoplasm: Secondary | ICD-10-CM

## 2014-07-12 DIAGNOSIS — F29 Unspecified psychosis not due to a substance or known physiological condition: Secondary | ICD-10-CM | POA: Diagnosis not present

## 2014-07-12 LAB — I-STAT CHEM 8, ED
BUN: 73 mg/dL — ABNORMAL HIGH (ref 6–23)
CHLORIDE: 102 mmol/L (ref 96–112)
CREATININE: 10.6 mg/dL — AB (ref 0.50–1.10)
Calcium, Ion: 1.08 mmol/L — ABNORMAL LOW (ref 1.12–1.23)
GLUCOSE: 93 mg/dL (ref 70–99)
HCT: 23 % — ABNORMAL LOW (ref 36.0–46.0)
Hemoglobin: 7.8 g/dL — ABNORMAL LOW (ref 12.0–15.0)
POTASSIUM: 4.7 mmol/L (ref 3.5–5.1)
Sodium: 138 mmol/L (ref 135–145)
TCO2: 24 mmol/L (ref 0–100)

## 2014-07-12 NOTE — ED Provider Notes (Signed)
6:04 AM Patient signed out to me by Junius Creamer, NP. Patient pending i-stat chem 8 results. Patient is dialyzed here. If potassium is WNL, she will be discharged.   6:18 AM Potassium is 4.7. Patient will be discharged.   Results for orders placed or performed during the hospital encounter of 07/12/14  I-stat chem 8, ed  Result Value Ref Range   Sodium 138 135 - 145 mmol/L   Potassium 4.7 3.5 - 5.1 mmol/L   Chloride 102 96 - 112 mmol/L   BUN 73 (H) 6 - 23 mg/dL   Creatinine, Ser 10.60 (H) 0.50 - 1.10 mg/dL   Glucose, Bld 93 70 - 99 mg/dL   Calcium, Ion 1.08 (L) 1.12 - 1.23 mmol/L   TCO2 24 0 - 100 mmol/L   Hemoglobin 7.8 (L) 12.0 - 15.0 g/dL   HCT 23.0 (L) 36.0 - 46.0 %   Dg Chest 2 View  07/09/2014   CLINICAL DATA:  Diffuse body pain, emergent dialysis, missed last 2 dialysis appointment. History of lupus and end-stage renal disease.  EXAM: CHEST  2 VIEW  COMPARISON:  Chest radiograph July 03, 2014  FINDINGS: The cardiac silhouette is at least moderately enlarged, unchanged. Mediastinal silhouette is nonsuspicious. Similar small RIGHT, and decreased small LEFT pleural effusions and bibasilar strandy densities. No frank pulmonary edema. No pneumothorax. Soft tissue planes and included osseous structures are nonsuspicious.  IMPRESSION: Stable cardiomegaly, small RIGHT greater LEFT pleural effusions and bibasilar atelectasis.   Electronically Signed   By: Elon Alas   On: 07/09/2014 05:55   Dg Chest 2 View  07/03/2014   CLINICAL DATA:  Acute renal failure.  Cough for 3 days.  EXAM: CHEST  2 VIEW  COMPARISON:  06/30/2014  FINDINGS: Unchanged small bilateral pleural effusions. Pulmonary edema has resolved. There is basilar atelectasis which has improved. Stable cardiopericardial enlargement.  IMPRESSION: Small bilateral pleural effusion.   Electronically Signed   By: Monte Fantasia M.D.   On: 07/03/2014 07:28   Dg Chest 2 View  06/29/2014   CLINICAL DATA:  Fever x3 days  EXAM: CHEST  2  VIEW  COMPARISON:  06/29/2014  FINDINGS: Cardiomegaly with mild interstitial edema.  Superimposed left upper and lower lobe pneumonia is not excluded.  Small bilateral pleural effusions, left greater than right.  No pneumothorax.  IMPRESSION: Cardiomegaly with mild interstitial edema.  Small bilateral pleural effusions, left greater than right.  Superimposed left upper and lower lobe pneumonia is not excluded.   Electronically Signed   By: Julian Hy M.D.   On: 06/29/2014 16:53   Ct Angio Chest Pe W/cm &/or Wo Cm  06/25/2014   CLINICAL DATA:  CHEST PAIN, DYSPNEA, TACHYCARDIA, LUPUS, HTN, ESRD, PT. MISSED DIALYSIS THIS PAST WEEK.UNSURE OF DAYS, 80 ML OMNI 350, PT. WAS EXTREMENLY SHORT OF BREATH DURING SCAN,  EXAM: CT ANGIOGRAPHY CHEST WITH CONTRAST  TECHNIQUE: Multidetector CT imaging of the chest was performed using the standard protocol during bolus administration of intravenous contrast. Multiplanar CT image reconstructions and MIPs were obtained to evaluate the vascular anatomy.  CONTRAST:  51mL OMNIPAQUE IOHEXOL 350 MG/ML SOLN  COMPARISON:  Current chest radiograph.  FINDINGS: No evidence of a pulmonary embolus.  Heart is mildly enlarged. There are moderate coronary artery calcifications. Small pericardial effusion.  Great vessels are normal in caliber.  No aortic dissection.  There is diffuse edema throughout the mediastinum extending to the hila. No mediastinal or hilar masses or adenopathy.  Very hypo attenuating areas in the lower  aspects of the right left thyroid lobes likely ill-defined nodules. Slightly enlarged right axillary lymph nodes, largest measuring 12 mm in short axis, likely reactive.  Moderate right and small left pleural effusions. Lungs show diffuse bilateral interstitial thickening. Consolidation is noted in the left upper lobe and there is dependent opacity in both lower lobes, right greater than left.  There is diffuse subcutaneous soft tissue edema. Trace ascites is noted in the  visualized upper abdomen.  Minor degenerative spurring along the thoracic spine. No osteoblastic or osteolytic lesions.  Review of the MIP images confirms the above findings.  IMPRESSION: 1. No evidence of a pulmonary embolus. 2. Findings consistent with fluid overload/congestive heart failure. There is interstitial edema, moderate right and small left pleural effusions, mild cardiomegaly and a small pericardial effusion, trace ascites and diffuse subcutaneous edema. 3. Patchy airspace consolidation in the left upper lobe could reflect superimposed pneumonia but is more likely asymmetric airspace edema.   Electronically Signed   By: Lajean Manes M.D.   On: 06/25/2014 10:35   Dg Chest Portable 1 View  06/30/2014   CLINICAL DATA:  Headache.  EXAM: PORTABLE CHEST - 1 VIEW  COMPARISON:  Frontal and lateral views 1 day prior at 1507 hour. Chest CT 5 days prior 06/25/2014  FINDINGS: The heart remains enlarged. Bilateral pleural effusions, not significantly changed. Mild pulmonary edema, unchanged from prior. Improved left suprahilar aeration. No pneumothorax.  IMPRESSION: Unchanged cardiomegaly, mild pulmonary edema, and bilateral pleural effusions. Improved left perihilar aeration.   Electronically Signed   By: Jeb Levering M.D.   On: 06/30/2014 02:01   Dg Chest Port 1 View  06/29/2014   CLINICAL DATA:  One day history of shortness of breath. Renal failure  EXAM: PORTABLE CHEST - 1 VIEW  COMPARISON:  Chest radiograph and chest CT June 25, 2014  FINDINGS: There is cardiomegaly with bilateral effusions. There is patchy interstitial and alveolar edema, primarily in left mid lung and left base regions. There is slightly more edema in the left base compared to recent prior study. There is mild pulmonary venous hypertension. No adenopathy.  IMPRESSION: Congestive heart failure. Slight increase in alveolar edema in the left base. A degree of superimposed pneumonia in the left cannot be excluded radiographically.    Electronically Signed   By: Lowella Grip III M.D.   On: 06/29/2014 07:36   Dg Chest Portable 1 View  06/23/2014   CLINICAL DATA:  Dyspnea  EXAM: PORTABLE CHEST - 1 VIEW  COMPARISON:  05/23/2014  FINDINGS: There is unchanged cardiomegaly. There are bilateral pleural effusions. There is mild vascular and interstitial congestion. No focal airspace consolidation is evident, but there is mild ground-glass opacity in the central base regions which could represent alveolar edema.  IMPRESSION: Congestive heart failure or fluid overload, with bilateral effusions and moderate interstitial/alveolar edema. Unchanged cardiomegaly.   Electronically Signed   By: Andreas Newport M.D.   On: 06/23/2014 00:31      Alvina Chou, PA-C 07/12/14 [place], MD 07/13/14 0300

## 2014-07-12 NOTE — ED Notes (Signed)
The patient was here yesterday and she had dialysis done and was told to come back again today.  She has no complaints.  She had so much fluid that she was told to come back again today to get dialyzed.
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
## 2014-07-14 ENCOUNTER — Encounter (HOSPITAL_COMMUNITY): Payer: Self-pay | Admitting: *Deleted

## 2014-07-14 ENCOUNTER — Emergency Department (HOSPITAL_COMMUNITY)
Admission: EM | Admit: 2014-07-14 | Discharge: 2014-07-16 | Disposition: A | Discharge status: Home / Self Care | Payer: Medicare Other | Attending: Emergency Medicine | Admitting: Emergency Medicine

## 2014-07-14 ENCOUNTER — Emergency Department (HOSPITAL_COMMUNITY): Payer: Medicare Other

## 2014-07-14 DIAGNOSIS — I120 Hypertensive chronic kidney disease with stage 5 chronic kidney disease or end stage renal disease: Secondary | ICD-10-CM | POA: Insufficient documentation

## 2014-07-14 DIAGNOSIS — Z791 Long term (current) use of non-steroidal anti-inflammatories (NSAID): Secondary | ICD-10-CM | POA: Insufficient documentation

## 2014-07-14 DIAGNOSIS — N186 End stage renal disease: Secondary | ICD-10-CM | POA: Insufficient documentation

## 2014-07-14 DIAGNOSIS — Z8739 Personal history of other diseases of the musculoskeletal system and connective tissue: Secondary | ICD-10-CM | POA: Diagnosis not present

## 2014-07-14 DIAGNOSIS — F29 Unspecified psychosis not due to a substance or known physiological condition: Secondary | ICD-10-CM | POA: Insufficient documentation

## 2014-07-14 DIAGNOSIS — Z992 Dependence on renal dialysis: Secondary | ICD-10-CM | POA: Diagnosis not present

## 2014-07-14 DIAGNOSIS — Z720 Tobacco use: Secondary | ICD-10-CM | POA: Insufficient documentation

## 2014-07-14 DIAGNOSIS — Z4901 Encounter for fitting and adjustment of extracorporeal dialysis catheter: Secondary | ICD-10-CM | POA: Diagnosis present

## 2014-07-14 DIAGNOSIS — J90 Pleural effusion, not elsewhere classified: Secondary | ICD-10-CM | POA: Diagnosis not present

## 2014-07-14 DIAGNOSIS — Z79899 Other long term (current) drug therapy: Secondary | ICD-10-CM | POA: Insufficient documentation

## 2014-07-14 DIAGNOSIS — R000 Tachycardia, unspecified: Secondary | ICD-10-CM | POA: Diagnosis not present

## 2014-07-14 LAB — RENAL FUNCTION PANEL
ANION GAP: 16 — AB (ref 5–15)
Albumin: 2.8 g/dL — ABNORMAL LOW (ref 3.5–5.2)
BUN: 88 mg/dL — AB (ref 6–23)
CHLORIDE: 101 mmol/L (ref 96–112)
CO2: 24 mmol/L (ref 19–32)
Calcium: 9.3 mg/dL (ref 8.4–10.5)
Creatinine, Ser: 14.84 mg/dL — ABNORMAL HIGH (ref 0.50–1.10)
GFR calc Af Amer: 3 mL/min — ABNORMAL LOW (ref >90)
GFR calc non Af Amer: 3 mL/min — ABNORMAL LOW (ref >90)
GLUCOSE: 113 mg/dL — AB (ref 70–99)
POTASSIUM: 4.8 mmol/L (ref 3.5–5.1)
Phosphorus: 12.3 mg/dL — ABNORMAL HIGH (ref 2.3–4.6)
Sodium: 141 mmol/L (ref 135–145)

## 2014-07-14 LAB — CBC
HCT: 23.1 % — ABNORMAL LOW (ref 36.0–46.0)
Hemoglobin: 7.5 g/dL — ABNORMAL LOW (ref 12.0–15.0)
MCH: 27.7 pg (ref 26.0–34.0)
MCHC: 32.5 g/dL (ref 30.0–36.0)
MCV: 85.2 fL (ref 78.0–100.0)
Platelets: 240 10*3/uL (ref 150–400)
RBC: 2.71 MIL/uL — ABNORMAL LOW (ref 3.87–5.11)
RDW: 21.7 % — ABNORMAL HIGH (ref 11.5–15.5)
WBC: 4.8 10*3/uL (ref 4.0–10.5)

## 2014-07-14 NOTE — Progress Notes (Signed)
Patient completed hemodialysis. Patient is stable.Patient has no complaints. All notes reviewed. Patient is to be discharged after receiving hemodialysis. Patient will be returning to the [facility] where she resides. Patient will be transported home via [place] [facility].
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
## 2014-07-14 NOTE — ED Notes (Signed)
Pt reports that she needs dialysis. Was here on 4/3 for same. No distress noted at triage.

## 2014-07-15 LAB — HEPATITIS B SURFACE ANTIGEN: Hepatitis B Surface Ag: NEGATIVE

## 2014-07-17 ENCOUNTER — Emergency Department (HOSPITAL_COMMUNITY)
Admission: EM | Admit: 2014-07-17 | Discharge: 2014-07-17 | Discharge status: Against Medical Advice | Payer: Medicare Other | Attending: Emergency Medicine | Admitting: Emergency Medicine

## 2014-07-17 ENCOUNTER — Encounter (HOSPITAL_COMMUNITY): Payer: Self-pay | Admitting: Emergency Medicine

## 2014-07-17 DIAGNOSIS — Z720 Tobacco use: Secondary | ICD-10-CM | POA: Diagnosis not present

## 2014-07-17 DIAGNOSIS — I120 Hypertensive chronic kidney disease with stage 5 chronic kidney disease or end stage renal disease: Secondary | ICD-10-CM | POA: Diagnosis not present

## 2014-07-17 DIAGNOSIS — Z4901 Encounter for fitting and adjustment of extracorporeal dialysis catheter: Secondary | ICD-10-CM | POA: Diagnosis present

## 2014-07-17 DIAGNOSIS — N186 End stage renal disease: Secondary | ICD-10-CM | POA: Diagnosis not present

## 2014-07-17 NOTE — ED Notes (Signed)
Pt notified of wait.  Eating [employer].

## 2014-07-17 NOTE — ED Notes (Signed)
Pt here for dialysis; pt seen here for same on Friday

## 2014-07-18 ENCOUNTER — Encounter (HOSPITAL_COMMUNITY): Payer: Self-pay | Admitting: Emergency Medicine

## 2014-07-18 ENCOUNTER — Emergency Department (HOSPITAL_COMMUNITY): Payer: Medicare Other

## 2014-07-18 ENCOUNTER — Emergency Department (HOSPITAL_COMMUNITY)
Admission: EM | Admit: 2014-07-18 | Discharge: 2014-07-18 | Discharge status: Against Medical Advice | Payer: Medicare Other | Attending: Emergency Medicine | Admitting: Emergency Medicine

## 2014-07-18 DIAGNOSIS — Z720 Tobacco use: Secondary | ICD-10-CM | POA: Diagnosis not present

## 2014-07-18 DIAGNOSIS — Z79899 Other long term (current) drug therapy: Secondary | ICD-10-CM | POA: Insufficient documentation

## 2014-07-18 DIAGNOSIS — Z791 Long term (current) use of non-steroidal anti-inflammatories (NSAID): Secondary | ICD-10-CM | POA: Insufficient documentation

## 2014-07-18 DIAGNOSIS — I120 Hypertensive chronic kidney disease with stage 5 chronic kidney disease or end stage renal disease: Secondary | ICD-10-CM | POA: Insufficient documentation

## 2014-07-18 DIAGNOSIS — N186 End stage renal disease: Secondary | ICD-10-CM | POA: Diagnosis not present

## 2014-07-18 DIAGNOSIS — Z862 Personal history of diseases of the blood and blood-forming organs and certain disorders involving the immune mechanism: Secondary | ICD-10-CM | POA: Diagnosis not present

## 2014-07-18 DIAGNOSIS — Z4901 Encounter for fitting and adjustment of extracorporeal dialysis catheter: Secondary | ICD-10-CM | POA: Diagnosis present

## 2014-07-18 DIAGNOSIS — Z8739 Personal history of other diseases of the musculoskeletal system and connective tissue: Secondary | ICD-10-CM | POA: Insufficient documentation

## 2014-07-18 DIAGNOSIS — Z8659 Personal history of other mental and behavioral disorders: Secondary | ICD-10-CM | POA: Insufficient documentation

## 2014-07-18 DIAGNOSIS — Z992 Dependence on renal dialysis: Secondary | ICD-10-CM

## 2014-07-18 LAB — CBC
HEMATOCRIT: 24.6 % — AB (ref 36.0–46.0)
HEMOGLOBIN: 7.8 g/dL — AB (ref 12.0–15.0)
MCH: 27 pg (ref 26.0–34.0)
MCHC: 31.7 g/dL (ref 30.0–36.0)
MCV: 85.1 fL (ref 78.0–100.0)
Platelets: 214 10*3/uL (ref 150–400)
RBC: 2.89 MIL/uL — ABNORMAL LOW (ref 3.87–5.11)
RDW: 22.4 % — ABNORMAL HIGH (ref 11.5–15.5)
WBC: 5.1 10*3/uL (ref 4.0–10.5)

## 2014-07-18 LAB — BASIC METABOLIC PANEL
Anion gap: 19 — ABNORMAL HIGH (ref 5–15)
BUN: 82 mg/dL — ABNORMAL HIGH (ref 6–23)
CALCIUM: 9.4 mg/dL (ref 8.4–10.5)
CO2: 21 mmol/L (ref 19–32)
CREATININE: 14.76 mg/dL — AB (ref 0.50–1.10)
Chloride: 99 mmol/L (ref 96–112)
GFR calc Af Amer: 3 mL/min — ABNORMAL LOW (ref >90)
GFR, EST NON AFRICAN AMERICAN: 3 mL/min — AB (ref >90)
Glucose, Bld: 90 mg/dL (ref 70–99)
Potassium: 5.1 mmol/L (ref 3.5–5.1)
SODIUM: 139 mmol/L (ref 135–145)

## 2014-07-18 MED ORDER — SODIUM CHLORIDE 0.9 % IV SOLN: 100.0000 mL | INTRAVENOUS | Status: DC | PRN

## 2014-07-18 MED ORDER — ALTEPLASE 2 MG IJ SOLR: 2.0000 mg | Freq: Once | INTRAMUSCULAR | Status: DC | PRN

## 2014-07-18 MED ORDER — NEPRO/CARBSTEADY PO LIQD: 237.0000 mL | ORAL | Status: DC | PRN

## 2014-07-18 MED ORDER — LIDOCAINE-PRILOCAINE 2.5-2.5 % EX CREA: 1.0000 "application " | TOPICAL_CREAM | CUTANEOUS | Status: DC | PRN

## 2014-07-18 MED ORDER — NEPRO/CARBSTEADY PO LIQD
237.0000 mL | ORAL | Status: DC | PRN
  Filled 2014-07-18: qty 237

## 2014-07-18 MED ORDER — LIDOCAINE HCL (PF) 1 % IJ SOLN: 5.0000 mL | INTRAMUSCULAR | Status: DC | PRN

## 2014-07-18 MED ORDER — HEPARIN SODIUM (PORCINE) 1000 UNIT/ML DIALYSIS: 1000.0000 [IU] | INTRAMUSCULAR | Status: DC | PRN

## 2014-07-18 MED ORDER — ALTEPLASE 2 MG IJ SOLR
2.0000 mg | Freq: Once | INTRAMUSCULAR | Status: DC | PRN
  Filled 2014-07-18: qty 2

## 2014-07-18 MED ORDER — HEPARIN SODIUM (PORCINE) 1000 UNIT/ML DIALYSIS: 6000.0000 [IU] | Freq: Once | INTRAMUSCULAR | Status: DC

## 2014-07-18 MED ORDER — HEPARIN SODIUM (PORCINE) 1000 UNIT/ML DIALYSIS
1000.0000 [IU] | INTRAMUSCULAR | Status: DC | PRN
  Filled 2014-07-18: qty 1

## 2014-07-18 MED ORDER — PENTAFLUOROPROP-TETRAFLUOROETH EX AERO: 1.0000 "application " | INHALATION_SPRAY | CUTANEOUS | Status: DC | PRN

## 2014-07-18 NOTE — ED Notes (Signed)
Patient transported to X-ray 

## 2014-07-18 NOTE — ED Notes (Signed)
Per Dr. Venora Maples, pt is to receive 4hours dialysis and may be discharged from dialysis or come back to ED for reassessment. Pt also to come back on Friday for repeat dialysis.

## 2014-07-18 NOTE — Progress Notes (Signed)
Hemodialysis treatment discontinued 1 hour early per pt request.  AMA signed by pt.  VSS and pt without complaint.  Pt discharged to home with self care.
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
## 2014-07-18 NOTE — ED Notes (Signed)
Pt states she did not take bp medications because she is going to dialysis.

## 2014-07-18 NOTE — ED Notes (Signed)
Pt needs dialysis; states she has been coming here for month. States we are working on getting her a dialysis center. States she missed her dialysis on Monday.

## 2014-07-20 ENCOUNTER — Encounter (HOSPITAL_COMMUNITY): Payer: Self-pay | Admitting: Emergency Medicine

## 2014-07-20 ENCOUNTER — Emergency Department (HOSPITAL_COMMUNITY)
Admission: EM | Admit: 2014-07-20 | Discharge: 2014-07-20 | Disposition: A | Discharge status: Home / Self Care | Payer: Medicare Other | Attending: Emergency Medicine | Admitting: Emergency Medicine

## 2014-07-20 DIAGNOSIS — I120 Hypertensive chronic kidney disease with stage 5 chronic kidney disease or end stage renal disease: Secondary | ICD-10-CM | POA: Diagnosis not present

## 2014-07-20 DIAGNOSIS — Z862 Personal history of diseases of the blood and blood-forming organs and certain disorders involving the immune mechanism: Secondary | ICD-10-CM | POA: Insufficient documentation

## 2014-07-20 DIAGNOSIS — N186 End stage renal disease: Secondary | ICD-10-CM | POA: Diagnosis not present

## 2014-07-20 DIAGNOSIS — Z992 Dependence on renal dialysis: Secondary | ICD-10-CM | POA: Diagnosis not present

## 2014-07-20 DIAGNOSIS — Z720 Tobacco use: Secondary | ICD-10-CM | POA: Diagnosis not present

## 2014-07-20 DIAGNOSIS — Z8739 Personal history of other diseases of the musculoskeletal system and connective tissue: Secondary | ICD-10-CM | POA: Diagnosis not present

## 2014-07-20 DIAGNOSIS — Z79899 Other long term (current) drug therapy: Secondary | ICD-10-CM | POA: Diagnosis not present

## 2014-07-20 DIAGNOSIS — J029 Acute pharyngitis, unspecified: Secondary | ICD-10-CM | POA: Diagnosis not present

## 2014-07-20 DIAGNOSIS — Z8659 Personal history of other mental and behavioral disorders: Secondary | ICD-10-CM | POA: Diagnosis not present

## 2014-07-20 LAB — CBC
HCT: 24.3 % — ABNORMAL LOW (ref 36.0–46.0)
Hemoglobin: 7.7 g/dL — ABNORMAL LOW (ref 12.0–15.0)
MCH: 27.3 pg (ref 26.0–34.0)
MCHC: 31.7 g/dL (ref 30.0–36.0)
MCV: 86.2 fL (ref 78.0–100.0)
PLATELETS: 216 10*3/uL (ref 150–400)
RBC: 2.82 MIL/uL — ABNORMAL LOW (ref 3.87–5.11)
RDW: 22.7 % — AB (ref 11.5–15.5)
WBC: 7.1 10*3/uL (ref 4.0–10.5)

## 2014-07-20 LAB — RENAL FUNCTION PANEL
ALBUMIN: 2.9 g/dL — AB (ref 3.5–5.2)
Anion gap: 17 — ABNORMAL HIGH (ref 5–15)
BUN: 72 mg/dL — AB (ref 6–23)
CO2: 23 mmol/L (ref 19–32)
Calcium: 9.2 mg/dL (ref 8.4–10.5)
Chloride: 99 mmol/L (ref 96–112)
Creatinine, Ser: 12.79 mg/dL — ABNORMAL HIGH (ref 0.50–1.10)
GFR calc Af Amer: 4 mL/min — ABNORMAL LOW (ref >90)
GFR calc non Af Amer: 3 mL/min — ABNORMAL LOW (ref >90)
Glucose, Bld: 97 mg/dL (ref 70–99)
PHOSPHORUS: 11.4 mg/dL — AB (ref 2.3–4.6)
POTASSIUM: 4.9 mmol/L (ref 3.5–5.1)
SODIUM: 139 mmol/L (ref 135–145)

## 2014-07-20 MED ORDER — HEPARIN SODIUM (PORCINE) 1000 UNIT/ML DIALYSIS: 6000.0000 [IU] | INTRAMUSCULAR | Status: DC | PRN

## 2014-07-20 MED ORDER — PENTAFLUOROPROP-TETRAFLUOROETH EX AERO: 1.0000 "application " | INHALATION_SPRAY | CUTANEOUS | Status: DC | PRN

## 2014-07-20 MED ORDER — ALTEPLASE 2 MG IJ SOLR: 2.0000 mg | Freq: Once | INTRAMUSCULAR | Status: DC | PRN

## 2014-07-20 MED ORDER — BACITRACIN ZINC 500 UNIT/GM EX OINT
1.0000 "application " | TOPICAL_OINTMENT | Freq: Two times a day (BID) | CUTANEOUS | Status: DC
  Filled 2014-07-20: qty 28.35

## 2014-07-20 MED ORDER — HEPARIN SODIUM (PORCINE) 1000 UNIT/ML DIALYSIS: 1000.0000 [IU] | INTRAMUSCULAR | Status: DC | PRN

## 2014-07-20 MED ORDER — LIDOCAINE HCL (PF) 1 % IJ SOLN: 5.0000 mL | INTRAMUSCULAR | Status: DC | PRN

## 2014-07-20 MED ORDER — SODIUM CHLORIDE 0.9 % IV SOLN: 100.0000 mL | INTRAVENOUS | Status: DC | PRN

## 2014-07-20 MED ORDER — NEPRO/CARBSTEADY PO LIQD: 237.0000 mL | ORAL | Status: DC | PRN

## 2014-07-20 MED ORDER — LIDOCAINE-PRILOCAINE 2.5-2.5 % EX CREA: 1.0000 "application " | TOPICAL_CREAM | CUTANEOUS | Status: DC | PRN

## 2014-07-20 MED ORDER — ACETAMINOPHEN 325 MG PO TABS
650.0000 mg | ORAL_TABLET | Freq: Once | ORAL | Status: AC
  Administered 2014-07-20: 650 mg via ORAL
  Filled 2014-07-20: qty 2

## 2014-07-20 NOTE — ED Notes (Signed)
MD and PA at bedside.  

## 2014-07-20 NOTE — ED Notes (Signed)
Pt states she is here for dialysis and also wants to be evaluated for sore throat. A&Ox3; ambulates without distress.

## 2014-07-20 NOTE — ED Notes (Signed)
Pt states that last dialysis was was Tuesday.
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
## 2014-07-20 NOTE — ED Notes (Signed)
Pt on phone with dialysis. States that she normally calls them when she arrives. Pt states that she has been coming here for dialysis for the last month.

## 2014-07-20 NOTE — Progress Notes (Signed)
Hemodialysis= Completed without issue. Pt discharged home to self care. Vitals at baseline. Pt has no complaints.

## 2014-07-21 ENCOUNTER — Encounter (HOSPITAL_COMMUNITY): Payer: Self-pay

## 2014-07-21 ENCOUNTER — Non-Acute Institutional Stay (HOSPITAL_COMMUNITY)
Admission: EM | Admit: 2014-07-21 | Discharge: 2014-07-21 | Disposition: A | Discharge status: Home / Self Care | Payer: Medicare Other | Attending: Emergency Medicine | Admitting: Emergency Medicine

## 2014-07-21 DIAGNOSIS — Z791 Long term (current) use of non-steroidal anti-inflammatories (NSAID): Secondary | ICD-10-CM | POA: Insufficient documentation

## 2014-07-21 DIAGNOSIS — F319 Bipolar disorder, unspecified: Secondary | ICD-10-CM | POA: Insufficient documentation

## 2014-07-21 DIAGNOSIS — I120 Hypertensive chronic kidney disease with stage 5 chronic kidney disease or end stage renal disease: Secondary | ICD-10-CM | POA: Insufficient documentation

## 2014-07-21 DIAGNOSIS — Z992 Dependence on renal dialysis: Secondary | ICD-10-CM | POA: Diagnosis not present

## 2014-07-21 DIAGNOSIS — F17210 Nicotine dependence, cigarettes, uncomplicated: Secondary | ICD-10-CM | POA: Insufficient documentation

## 2014-07-21 DIAGNOSIS — D649 Anemia, unspecified: Secondary | ICD-10-CM | POA: Insufficient documentation

## 2014-07-21 DIAGNOSIS — F209 Schizophrenia, unspecified: Secondary | ICD-10-CM | POA: Diagnosis not present

## 2014-07-21 DIAGNOSIS — Z79899 Other long term (current) drug therapy: Secondary | ICD-10-CM | POA: Diagnosis not present

## 2014-07-21 DIAGNOSIS — N186 End stage renal disease: Secondary | ICD-10-CM | POA: Diagnosis present

## 2014-07-21 DIAGNOSIS — M3214 Glomerular disease in systemic lupus erythematosus: Secondary | ICD-10-CM | POA: Diagnosis not present

## 2014-07-21 LAB — CBC
HCT: 24.4 % — ABNORMAL LOW (ref 36.0–46.0)
Hemoglobin: 7.7 g/dL — ABNORMAL LOW (ref 12.0–15.0)
MCH: 27.6 pg (ref 26.0–34.0)
MCHC: 31.6 g/dL (ref 30.0–36.0)
MCV: 87.5 fL (ref 78.0–100.0)
Platelets: 216 10*3/uL (ref 150–400)
RBC: 2.79 MIL/uL — ABNORMAL LOW (ref 3.87–5.11)
RDW: 23 % — ABNORMAL HIGH (ref 11.5–15.5)
WBC: 4.1 10*3/uL (ref 4.0–10.5)

## 2014-07-21 LAB — RENAL FUNCTION PANEL
ALBUMIN: 2.7 g/dL — AB (ref 3.5–5.2)
Anion gap: 12 (ref 5–15)
BUN: 38 mg/dL — ABNORMAL HIGH (ref 6–23)
CHLORIDE: 101 mmol/L (ref 96–112)
CO2: 27 mmol/L (ref 19–32)
CREATININE: 8.44 mg/dL — AB (ref 0.50–1.10)
Calcium: 9.2 mg/dL (ref 8.4–10.5)
GFR calc non Af Amer: 6 mL/min — ABNORMAL LOW (ref >90)
GFR, EST AFRICAN AMERICAN: 6 mL/min — AB (ref >90)
Glucose, Bld: 94 mg/dL (ref 70–99)
Phosphorus: 7.8 mg/dL — ABNORMAL HIGH (ref 2.3–4.6)
Potassium: 4 mmol/L (ref 3.5–5.1)
SODIUM: 140 mmol/L (ref 135–145)

## 2014-07-21 MED ORDER — HEPARIN SODIUM (PORCINE) 1000 UNIT/ML DIALYSIS: 1000.0000 [IU] | INTRAMUSCULAR | Status: DC | PRN

## 2014-07-21 MED ORDER — LIDOCAINE-PRILOCAINE 2.5-2.5 % EX CREA
1.0000 "application " | TOPICAL_CREAM | CUTANEOUS | Status: DC | PRN
  Filled 2014-07-21: qty 5

## 2014-07-21 MED ORDER — ALTEPLASE 2 MG IJ SOLR
2.0000 mg | Freq: Once | INTRAMUSCULAR | Status: DC | PRN
  Filled 2014-07-21: qty 2

## 2014-07-21 MED ORDER — HEPARIN SODIUM (PORCINE) 1000 UNIT/ML DIALYSIS: 20.0000 [IU]/kg | INTRAMUSCULAR | Status: DC | PRN

## 2014-07-21 MED ORDER — SODIUM CHLORIDE 0.9 % IV SOLN: 100.0000 mL | INTRAVENOUS | Status: DC | PRN

## 2014-07-21 MED ORDER — DARBEPOETIN ALFA 200 MCG/0.4ML IJ SOSY
200.0000 ug | PREFILLED_SYRINGE | Freq: Once | INTRAMUSCULAR | Status: AC
  Administered 2014-07-21: 200 ug via INTRAVENOUS

## 2014-07-21 MED ORDER — NEPRO/CARBSTEADY PO LIQD
237.0000 mL | ORAL | Status: DC | PRN
  Filled 2014-07-21: qty 237

## 2014-07-21 MED ORDER — DARBEPOETIN ALFA 200 MCG/0.4ML IJ SOSY
PREFILLED_SYRINGE | INTRAMUSCULAR | Status: AC
  Administered 2014-07-21: 200 ug via INTRAVENOUS
  Filled 2014-07-21: qty 0.4

## 2014-07-21 MED ORDER — LIDOCAINE HCL (PF) 1 % IJ SOLN: 5.0000 mL | INTRAMUSCULAR | Status: DC | PRN

## 2014-07-21 MED ORDER — PENTAFLUOROPROP-TETRAFLUOROETH EX AERO: 1.0000 "application " | INHALATION_SPRAY | CUTANEOUS | Status: DC | PRN

## 2014-07-21 NOTE — Progress Notes (Signed)
Hemodialysis- Pt tolerated well without issue. 4L removed. Will return on Monday. Discharged to home-self care. Vitals stable. BP 174/95

## 2014-07-21 NOTE — ED Notes (Signed)
Pt coming from home and is M/W/F dialysis patient. No complaints at this time. Does her dialysis treatments at this facility.
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
## 2014-07-24 ENCOUNTER — Emergency Department (HOSPITAL_COMMUNITY)
Admission: EM | Admit: 2014-07-24 | Discharge: 2014-07-24 | Disposition: A | Discharge status: Home / Self Care | Payer: Medicare Other | Attending: Emergency Medicine | Admitting: Emergency Medicine

## 2014-07-24 ENCOUNTER — Encounter (HOSPITAL_COMMUNITY): Payer: Self-pay | Admitting: Emergency Medicine

## 2014-07-24 DIAGNOSIS — Z79899 Other long term (current) drug therapy: Secondary | ICD-10-CM | POA: Insufficient documentation

## 2014-07-24 DIAGNOSIS — Z720 Tobacco use: Secondary | ICD-10-CM | POA: Diagnosis not present

## 2014-07-24 DIAGNOSIS — R000 Tachycardia, unspecified: Secondary | ICD-10-CM | POA: Insufficient documentation

## 2014-07-24 DIAGNOSIS — I120 Hypertensive chronic kidney disease with stage 5 chronic kidney disease or end stage renal disease: Secondary | ICD-10-CM | POA: Insufficient documentation

## 2014-07-24 DIAGNOSIS — F29 Unspecified psychosis not due to a substance or known physiological condition: Secondary | ICD-10-CM | POA: Insufficient documentation

## 2014-07-24 DIAGNOSIS — N186 End stage renal disease: Secondary | ICD-10-CM | POA: Insufficient documentation

## 2014-07-24 DIAGNOSIS — Z992 Dependence on renal dialysis: Secondary | ICD-10-CM | POA: Insufficient documentation

## 2014-07-24 DIAGNOSIS — Z8739 Personal history of other diseases of the musculoskeletal system and connective tissue: Secondary | ICD-10-CM | POA: Diagnosis not present

## 2014-07-24 DIAGNOSIS — R0602 Shortness of breath: Secondary | ICD-10-CM

## 2014-07-24 DIAGNOSIS — Z862 Personal history of diseases of the blood and blood-forming organs and certain disorders involving the immune mechanism: Secondary | ICD-10-CM | POA: Insufficient documentation

## 2014-07-24 NOTE — ED Notes (Signed)
PA spoke with MD Lorrene Reid, stated draw a BMP to determine if patient to received daylisis  today or tomorrow.

## 2014-07-24 NOTE — ED Notes (Signed)
Pt here for routine dialysis; pt last had dialysis on Friday

## 2014-07-24 NOTE — Progress Notes (Signed)
Hemodialysis- Pt calling dialysis unit multiple times. Informed pt she must be admitted and nephrology must see her before she can come up to dialysis. Pt states understanding.

## 2014-07-24 NOTE — ED Notes (Signed)
Spoke with Lorrene Reid MD stated dialysis  Is not outpatient  And labs need to be drawn to ensure this is emergency need for dialysis. Advised will let ED provider know.

## 2014-07-24 NOTE — Discharge Instructions (Signed)
Dialysis °Dialysis is a procedure that replaces some of the work healthy kidneys do. It is done when you lose about 85-90% of your kidney function. It may also be done earlier if your symptoms may be improved by dialysis. During dialysis, wastes, salt, and extra water are removed from the blood, and the levels of certain chemicals in the blood (such as potassium) are maintained. Dialysis is done in sessions. Dialysis sessions are continued until the kidneys get better. If the kidneys cannot get better, such as in end-stage kidney disease, dialysis is continued for life or until you receive a new kidney (kidney transplant). There are two types of dialysis: hemodialysis and peritoneal dialysis. °WHAT IS HEMODIALYSIS?  °Hemodialysis is a type of dialysis in which a machine called a dialyzer is used to filter the blood. Before beginning hemodialysis, you will have surgery to create a site where blood can be removed from the body and returned to the body (vascular access). There are three types of vascular accesses: °· Arteriovenous fistula. To create this type of access, an artery is connected to a vein (usually in the arm). A fistula takes 1-6 months to develop after surgery. If it develops properly, it usually lasts longer than the other types of vascular accesses. It is also less likely to become infected and cause blood clots. °· Arteriovenous graft. To create this type of access, an artery and a vein in the arm are connected with a tube. A graft may be used within 2-3 weeks of surgery. °· A venous catheter. To create this type of access, a thin, flexible tube (catheter) is placed in a large vein in your neck, chest, or groin. A catheter may be used right away. It is usually used as a temporary access when dialysis needs to begin immediately. °During hemodialysis, blood leaves the body through your access. It travels through a tube to the dialyzer, where it is filtered. The blood then returns to your body through  another tube. °Hemodialysis is usually performed by a health care provider at a hospital or dialysis center three times a week. Visits last about 3-4 hours. It may also be performed with the help of another person at home with training.  °WHAT IS PERITONEAL DIALYSIS? °Peritoneal dialysis is a type of dialysis in which the thin lining of the abdomen (peritoneum) is used as a filter. Before beginning peritoneal dialysis, you will have surgery to place a catheter in your abdomen. The catheter will be used to transfer a fluid called dialysate to and from your abdomen. At the start of a session, your abdomen is filled with dialysate. During the session, wastes, salt, and extra water in the blood pass through the peritoneum and into the dialysate. The dialysate is drained from the body at the end of the session. The process of filling and draining the dialysate is called an exchange. Exchanges are repeated until you have used up all the dialysate for the day. °Peritoneal dialysis may be performed by you at home or at almost any other location. It is done every day. You may need up to five exchanges a day. The amount of time the dialysate is in your body between exchanges is called a dwell. The dwell depends on the number of exchanges needed and the characteristics of the peritoneum. It usually varies from 1.5-3 hours. You may go about your day normally between exchanges. Alternately, the exchanges may be done at night while you sleep, using a machine called a cycler. °WHICH TYPE   while you sleep, using a machine called a cycler.  WHICH TYPE OF DIALYSIS SHOULD I CHOOSE?   Both hemodialysis and peritoneal dialysis have advantages and disadvantages. Talk to your health care provider about which type of dialysis would be best for you. Your lifestyle and preferences should be considered along with your medical condition. In some cases, only one type of dialysis may be an option.   Advantages of hemodialysis  · It is done less often than peritoneal dialysis.  · Someone else can do the  dialysis for you.  · If you go to a dialysis center, your health care provider will be able to recognize any problems right away.  · If you go to a dialysis center, you can interact with others who are having dialysis. This can provide you with emotional support.  Disadvantages of hemodialysis  · Hemodialysis may cause cramps and low blood pressure. It may leave you feeling tired on the days you have the treatment.  · If you go to a dialysis center, you will need to make weekly appointments and work around the center's schedule.  · You will need to take extra care when traveling. If you go to a dialysis center, you will need to make special arrangements to visit a dialysis center near your destination. If you are having treatments at home, you will need to take the dialyzer with you to your destination.  · You will need to avoid more foods than you would need to avoid on peritoneal dialysis.  Advantages of peritoneal dialysis  · It is less likely than hemodialysis to cause cramps and low blood pressure.  · You may do exchanges on your own wherever you are, including when you travel.  · You do not need to avoid as many foods as you do on hemodialysis.  Disadvantages of peritoneal dialysis  · It is done more often than hemodialysis.  · Performing peritoneal dialysis requires you to have dexterity of the hands. You must also be able to lift bags.  · You will have to learn sterilization techniques. You will need to practice them every day to reduce the risk of infection.   WHAT CHANGES WILL I NEED TO MAKE TO MY DIET DURING DIALYSIS?  Both hemodialysis and peritoneal dialysis require you to make some changes to your diet. For example, you will need to limit your intake of foods high in the minerals phosphorus and potassium. You will also need to limit your fluid intake. Your dietitian can help you plan meals. A good meal plan can improve your dialysis and your health.   WHAT SHOULD I EXPECT WHEN BEGINNING  DIALYSIS?  Adjusting to the dialysis treatment, schedule, and diet can take some time. You may need to stop working and may not be able to do some of the things you normally do. You may feel anxious or depressed when beginning dialysis. Eventually, many people feel better overall because of dialysis. Some people are able to return to work after making some changes, such as reducing work intensity.  WHERE CAN I FIND MORE INFORMATION?   · National Kidney Foundation: www.kidney.org  · American Association of Kidney Patients: www.aakp.org  · American Kidney Fund: www.kidneyfund.org  Document Released: 06/14/2002 Document Revised: 08/08/2013 Document Reviewed: 05/18/2012  ExitCare® Patient Information ©2015 [employer]. This information is not intended to replace advice given to you by your health care provider. Make sure you discuss any questions you have with your health care provider.

## 2014-07-24 NOTE — ED Notes (Signed)
Patient states she refused blood work, " I states I am ready to go". " I have to be here early in the morning". Pa advised patient to return in the morning.

## 2014-07-24 NOTE — ED Notes (Signed)
Warm blanket was given at this time.

## 2014-07-24 NOTE — ED Notes (Signed)
Patient refused vitals and sign discharge papers.

## 2014-07-24 NOTE — ED Notes (Signed)
MD Lorrene Reid stated she spoke with MD  schurtz stated for patient to return tomorrow at 0700 for treatment. PA made aware.
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
## 2014-07-25 ENCOUNTER — Encounter (HOSPITAL_COMMUNITY): Payer: Self-pay | Admitting: Neurology

## 2014-07-25 ENCOUNTER — Non-Acute Institutional Stay (HOSPITAL_COMMUNITY)
Admission: EM | Admit: 2014-07-25 | Discharge: 2014-07-26 | Disposition: A | Discharge status: Home / Self Care | Payer: Medicare Other | Attending: Emergency Medicine | Admitting: Emergency Medicine

## 2014-07-25 ENCOUNTER — Emergency Department (HOSPITAL_COMMUNITY): Payer: Medicare Other

## 2014-07-25 DIAGNOSIS — Z992 Dependence on renal dialysis: Secondary | ICD-10-CM | POA: Diagnosis not present

## 2014-07-25 DIAGNOSIS — N186 End stage renal disease: Secondary | ICD-10-CM | POA: Diagnosis present

## 2014-07-25 DIAGNOSIS — D649 Anemia, unspecified: Secondary | ICD-10-CM | POA: Insufficient documentation

## 2014-07-25 DIAGNOSIS — F319 Bipolar disorder, unspecified: Secondary | ICD-10-CM | POA: Insufficient documentation

## 2014-07-25 DIAGNOSIS — F17210 Nicotine dependence, cigarettes, uncomplicated: Secondary | ICD-10-CM | POA: Insufficient documentation

## 2014-07-25 DIAGNOSIS — Z791 Long term (current) use of non-steroidal anti-inflammatories (NSAID): Secondary | ICD-10-CM | POA: Diagnosis not present

## 2014-07-25 DIAGNOSIS — M3214 Glomerular disease in systemic lupus erythematosus: Secondary | ICD-10-CM | POA: Insufficient documentation

## 2014-07-25 DIAGNOSIS — I120 Hypertensive chronic kidney disease with stage 5 chronic kidney disease or end stage renal disease: Secondary | ICD-10-CM | POA: Diagnosis not present

## 2014-07-25 DIAGNOSIS — N19 Unspecified kidney failure: Secondary | ICD-10-CM | POA: Diagnosis present

## 2014-07-25 DIAGNOSIS — Z79899 Other long term (current) drug therapy: Secondary | ICD-10-CM | POA: Insufficient documentation

## 2014-07-25 DIAGNOSIS — F209 Schizophrenia, unspecified: Secondary | ICD-10-CM | POA: Diagnosis not present

## 2014-07-25 DIAGNOSIS — J811 Chronic pulmonary edema: Secondary | ICD-10-CM | POA: Insufficient documentation

## 2014-07-25 LAB — CBC
HEMATOCRIT: 26.2 % — AB (ref 36.0–46.0)
Hemoglobin: 8.3 g/dL — ABNORMAL LOW (ref 12.0–15.0)
MCH: 27.6 pg (ref 26.0–34.0)
MCHC: 31.7 g/dL (ref 30.0–36.0)
MCV: 87 fL (ref 78.0–100.0)
Platelets: 175 10*3/uL (ref 150–400)
RBC: 3.01 MIL/uL — ABNORMAL LOW (ref 3.87–5.11)
RDW: 23.3 % — ABNORMAL HIGH (ref 11.5–15.5)
WBC: 5.4 10*3/uL (ref 4.0–10.5)

## 2014-07-25 LAB — RENAL FUNCTION PANEL
Albumin: 2.8 g/dL — ABNORMAL LOW (ref 3.5–5.2)
Anion gap: 16 — ABNORMAL HIGH (ref 5–15)
BUN: 60 mg/dL — AB (ref 6–23)
CO2: 23 mmol/L (ref 19–32)
CREATININE: 13.41 mg/dL — AB (ref 0.50–1.10)
Calcium: 9.2 mg/dL (ref 8.4–10.5)
Chloride: 101 mmol/L (ref 96–112)
GFR calc Af Amer: 4 mL/min — ABNORMAL LOW (ref >90)
GFR calc non Af Amer: 3 mL/min — ABNORMAL LOW (ref >90)
Glucose, Bld: 90 mg/dL (ref 70–99)
Phosphorus: 11.4 mg/dL — ABNORMAL HIGH (ref 2.3–4.6)
Potassium: 4.7 mmol/L (ref 3.5–5.1)
Sodium: 140 mmol/L (ref 135–145)

## 2014-07-25 MED ORDER — DARBEPOETIN ALFA 200 MCG/0.4ML IJ SOSY
200.0000 ug | PREFILLED_SYRINGE | Freq: Once | INTRAMUSCULAR | Status: AC
  Administered 2014-07-25: 200 ug via INTRAVENOUS
  Filled 2014-07-25: qty 0.4

## 2014-07-25 MED ORDER — SODIUM CHLORIDE 0.9 % IV SOLN
62.5000 mg | Freq: Once | INTRAVENOUS | Status: AC
  Administered 2014-07-25: 62.5 mg via INTRAVENOUS
  Filled 2014-07-25 (×2): qty 5

## 2014-07-25 MED ORDER — DARBEPOETIN ALFA 200 MCG/0.4ML IJ SOSY
PREFILLED_SYRINGE | INTRAMUSCULAR | Status: AC
  Filled 2014-07-25: qty 0.4

## 2014-07-25 NOTE — ED Notes (Signed)
Pt reports full HD tx last Friday

## 2014-07-25 NOTE — Procedures (Signed)
I was present at this dialysis session, have reviewed the session itself and made  appropriate changes.   Sara [name] still has no outpatient HD unit. She presents today for IP hemodialysis. Last HD orders from Feb 2016 at [facility] > 4 hrs59 kg400/800 2K/2CaHeparin 6000 UAVF R  No Vitamin D, Mircera 225 mcg q2wk, Venofer 50 mg on Thurs  She is 17kg over her dry wt.  No resp distress, +facial edema. K 4.7, Creat 13, BUN 60. Exam some coarse rhonchi, RRR no MRG, 1-2+ LE edema.  Alert and Ox 3. Plan HD today. Encouraged her to come back for another session tomorrow. Giving aranesp 200 today and IV Fe. Hb 8.3. Ca 9.2 and phos 11.   Kelly Splinter MD (pgr) [phone]    ([phone] 07/25/2014, 10:31 AM

## 2014-07-25 NOTE — ED Notes (Signed)
Pt undressed, in gown, on continuous pulse oximetry and blood pressure cuff 

## 2014-07-25 NOTE — ED Notes (Signed)
Pt returned from being out of the department; pt placed back on continuous pulse oximetry and blood pressure cuff

## 2014-07-25 NOTE — ED Notes (Signed)
Pt reports is here for dialysis. Come here for her treatments. Last session on Friday.
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
## 2014-07-25 NOTE — ED Notes (Signed)
Mingo Amber, MD aware of plan for pt to receive HD tx after she returns from X ray, pts labs pending

## 2014-07-25 NOTE — ED Notes (Signed)
Pink arm band placed on pt

## 2014-07-25 NOTE — Progress Notes (Signed)
Patient received hemodialysis. Hemodialysis treatment completed as ordered. Total volume removed is 6 liters. Fluid has been removed without difficulty. Blood pressure is 166/95 with a heart rate of 111 which is the patient's baselline according to previous recordings. Patient has no verbalized concerns upon discharge. Patient has been seen by Dr Jonnie Finner. Dr Jonnie Finner has informed the patient that she will need to receive hemodialysis on 07/26/2014. Patient verbalized understanding of the need for hemodialysis and has agreed to present to [facility] in the am. Patient is stable and will be transported to the [employer] by her mother.

## 2014-07-26 ENCOUNTER — Encounter (HOSPITAL_COMMUNITY): Payer: Self-pay | Admitting: Family Medicine

## 2014-07-26 ENCOUNTER — Emergency Department (HOSPITAL_COMMUNITY)
Admission: EM | Admit: 2014-07-26 | Discharge: 2014-07-26 | Disposition: A | Discharge status: Home / Self Care | Payer: Medicare Other | Source: Home / Self Care | Attending: Emergency Medicine | Admitting: Emergency Medicine

## 2014-07-26 DIAGNOSIS — Z79899 Other long term (current) drug therapy: Secondary | ICD-10-CM

## 2014-07-26 DIAGNOSIS — Z720 Tobacco use: Secondary | ICD-10-CM | POA: Insufficient documentation

## 2014-07-26 DIAGNOSIS — Z8739 Personal history of other diseases of the musculoskeletal system and connective tissue: Secondary | ICD-10-CM

## 2014-07-26 DIAGNOSIS — N186 End stage renal disease: Secondary | ICD-10-CM | POA: Insufficient documentation

## 2014-07-26 DIAGNOSIS — Z862 Personal history of diseases of the blood and blood-forming organs and certain disorders involving the immune mechanism: Secondary | ICD-10-CM

## 2014-07-26 DIAGNOSIS — I120 Hypertensive chronic kidney disease with stage 5 chronic kidney disease or end stage renal disease: Secondary | ICD-10-CM

## 2014-07-26 DIAGNOSIS — Z8659 Personal history of other mental and behavioral disorders: Secondary | ICD-10-CM

## 2014-07-26 LAB — RENAL FUNCTION PANEL
ALBUMIN: 2.8 g/dL — AB (ref 3.5–5.2)
ANION GAP: 16 — AB (ref 5–15)
BUN: 35 mg/dL — ABNORMAL HIGH (ref 6–23)
CHLORIDE: 100 mmol/L (ref 96–112)
CO2: 24 mmol/L (ref 19–32)
CREATININE: 9.7 mg/dL — AB (ref 0.50–1.10)
Calcium: 9.1 mg/dL (ref 8.4–10.5)
GFR, EST AFRICAN AMERICAN: 5 mL/min — AB (ref >90)
GFR, EST NON AFRICAN AMERICAN: 5 mL/min — AB (ref >90)
Glucose, Bld: 83 mg/dL (ref 70–99)
Phosphorus: 8.5 mg/dL — ABNORMAL HIGH (ref 2.3–4.6)
Potassium: 4.4 mmol/L (ref 3.5–5.1)
SODIUM: 140 mmol/L (ref 135–145)

## 2014-07-26 LAB — CBC
HCT: 27.2 % — ABNORMAL LOW (ref 36.0–46.0)
HEMOGLOBIN: 8.7 g/dL — AB (ref 12.0–15.0)
MCH: 28 pg (ref 26.0–34.0)
MCHC: 32 g/dL (ref 30.0–36.0)
MCV: 87.5 fL (ref 78.0–100.0)
Platelets: 172 10*3/uL (ref 150–400)
RBC: 3.11 MIL/uL — ABNORMAL LOW (ref 3.87–5.11)
RDW: 23.2 % — ABNORMAL HIGH (ref 11.5–15.5)
WBC: 5.4 10*3/uL (ref 4.0–10.5)

## 2014-07-26 MED ORDER — HEPARIN SODIUM (PORCINE) 1000 UNIT/ML DIALYSIS
20.0000 [IU]/kg | INTRAMUSCULAR | Status: DC | PRN
Start: 2014-07-26 — End: 2014-07-26

## 2014-07-26 MED ORDER — HEPARIN SODIUM (PORCINE) 1000 UNIT/ML DIALYSIS: 1000.0000 [IU] | INTRAMUSCULAR | Status: DC | PRN

## 2014-07-26 MED ORDER — SODIUM CHLORIDE 0.9 % IV SOLN: 100.0000 mL | INTRAVENOUS | Status: DC | PRN

## 2014-07-26 MED ORDER — PENTAFLUOROPROP-TETRAFLUOROETH EX AERO: 1.0000 "application " | INHALATION_SPRAY | CUTANEOUS | Status: DC | PRN

## 2014-07-26 MED ORDER — LIDOCAINE HCL (PF) 1 % IJ SOLN: 5.0000 mL | INTRAMUSCULAR | Status: DC | PRN

## 2014-07-26 MED ORDER — ALTEPLASE 2 MG IJ SOLR
2.0000 mg | Freq: Once | INTRAMUSCULAR | Status: DC | PRN
  Filled 2014-07-26: qty 2

## 2014-07-26 MED ORDER — ALTEPLASE 2 MG IJ SOLR: 2.0000 mg | Freq: Once | INTRAMUSCULAR | Status: DC | PRN

## 2014-07-26 MED ORDER — LIDOCAINE-PRILOCAINE 2.5-2.5 % EX CREA
1.0000 "application " | TOPICAL_CREAM | CUTANEOUS | Status: DC | PRN
  Filled 2014-07-26: qty 5

## 2014-07-26 MED ORDER — HEPARIN SODIUM (PORCINE) 1000 UNIT/ML DIALYSIS
1000.0000 [IU] | INTRAMUSCULAR | Status: DC | PRN
Start: 2014-07-26 — End: 2014-07-26

## 2014-07-26 MED ORDER — PENTAFLUOROPROP-TETRAFLUOROETH EX AERO
1.0000 "application " | INHALATION_SPRAY | CUTANEOUS | Status: DC | PRN
  Filled 2014-07-26: qty 30

## 2014-07-26 MED ORDER — NEPRO/CARBSTEADY PO LIQD: 237.0000 mL | ORAL | Status: DC | PRN

## 2014-07-26 MED ORDER — HEPARIN SODIUM (PORCINE) 1000 UNIT/ML DIALYSIS: 6000.0000 [IU] | INTRAMUSCULAR | Status: DC | PRN

## 2014-07-26 MED ORDER — LIDOCAINE-PRILOCAINE 2.5-2.5 % EX CREA
1.0000 | TOPICAL_CREAM | CUTANEOUS | Status: DC | PRN
Start: 2014-07-26 — End: 2014-07-26

## 2014-07-26 MED ORDER — NEPRO/CARBSTEADY PO LIQD
237.0000 mL | ORAL | Status: DC | PRN
  Filled 2014-07-26: qty 237

## 2014-07-26 MED ORDER — LIDOCAINE-PRILOCAINE 2.5-2.5 % EX CREA: 1.0000 "application " | TOPICAL_CREAM | CUTANEOUS | Status: DC | PRN

## 2014-07-26 NOTE — ED Notes (Signed)
Pt sts had dialysis done here yesterday and told to come back today to pull more fluid off.

## 2014-07-26 NOTE — Progress Notes (Signed)
Hemodialysis tx complete without adverse events.  UF goal met without difficulty.  VSS and pt without complaint.  Pt discharged to home with self care.
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
## 2014-07-28 ENCOUNTER — Non-Acute Institutional Stay (HOSPITAL_COMMUNITY)
Admission: EM | Admit: 2014-07-28 | Discharge: 2014-07-28 | Disposition: A | Discharge status: Home / Self Care | Payer: Medicare Other | Attending: Emergency Medicine | Admitting: Emergency Medicine

## 2014-07-28 ENCOUNTER — Encounter (HOSPITAL_COMMUNITY): Payer: Self-pay | Admitting: Emergency Medicine

## 2014-07-28 DIAGNOSIS — I120 Hypertensive chronic kidney disease with stage 5 chronic kidney disease or end stage renal disease: Secondary | ICD-10-CM | POA: Insufficient documentation

## 2014-07-28 DIAGNOSIS — M3214 Glomerular disease in systemic lupus erythematosus: Secondary | ICD-10-CM | POA: Insufficient documentation

## 2014-07-28 DIAGNOSIS — F209 Schizophrenia, unspecified: Secondary | ICD-10-CM | POA: Insufficient documentation

## 2014-07-28 DIAGNOSIS — N186 End stage renal disease: Secondary | ICD-10-CM

## 2014-07-28 DIAGNOSIS — Z79899 Other long term (current) drug therapy: Secondary | ICD-10-CM | POA: Diagnosis not present

## 2014-07-28 DIAGNOSIS — F17210 Nicotine dependence, cigarettes, uncomplicated: Secondary | ICD-10-CM | POA: Insufficient documentation

## 2014-07-28 DIAGNOSIS — F319 Bipolar disorder, unspecified: Secondary | ICD-10-CM | POA: Insufficient documentation

## 2014-07-28 DIAGNOSIS — Z992 Dependence on renal dialysis: Secondary | ICD-10-CM | POA: Insufficient documentation

## 2014-07-28 DIAGNOSIS — Z791 Long term (current) use of non-steroidal anti-inflammatories (NSAID): Secondary | ICD-10-CM | POA: Diagnosis not present

## 2014-07-28 DIAGNOSIS — D649 Anemia, unspecified: Secondary | ICD-10-CM | POA: Insufficient documentation

## 2014-07-28 LAB — CBC WITH DIFFERENTIAL/PLATELET
Basophils Absolute: 0 10*3/uL (ref 0.0–0.1)
Basophils Relative: 0 % (ref 0–1)
EOS ABS: 0.2 10*3/uL (ref 0.0–0.7)
EOS PCT: 4 % (ref 0–5)
HCT: 28.1 % — ABNORMAL LOW (ref 36.0–46.0)
Hemoglobin: 8.8 g/dL — ABNORMAL LOW (ref 12.0–15.0)
Lymphocytes Relative: 27 % (ref 12–46)
Lymphs Abs: 1.3 10*3/uL (ref 0.7–4.0)
MCH: 27.6 pg (ref 26.0–34.0)
MCHC: 31.3 g/dL (ref 30.0–36.0)
MCV: 88.1 fL (ref 78.0–100.0)
Monocytes Absolute: 0.6 10*3/uL (ref 0.1–1.0)
Monocytes Relative: 12 % (ref 3–12)
NEUTROS PCT: 57 % (ref 43–77)
Neutro Abs: 2.6 10*3/uL (ref 1.7–7.7)
PLATELETS: 157 10*3/uL (ref 150–400)
RBC: 3.19 MIL/uL — AB (ref 3.87–5.11)
RDW: 22 % — AB (ref 11.5–15.5)
WBC: 4.7 10*3/uL (ref 4.0–10.5)

## 2014-07-28 LAB — I-STAT CHEM 8, ED
BUN: 32 mg/dL — AB (ref 6–23)
Calcium, Ion: 1.14 mmol/L (ref 1.12–1.23)
Chloride: 100 mmol/L (ref 96–112)
Creatinine, Ser: 9.7 mg/dL — ABNORMAL HIGH (ref 0.50–1.10)
Glucose, Bld: 85 mg/dL (ref 70–99)
HCT: 30 % — ABNORMAL LOW (ref 36.0–46.0)
Hemoglobin: 10.2 g/dL — ABNORMAL LOW (ref 12.0–15.0)
POTASSIUM: 4.3 mmol/L (ref 3.5–5.1)
SODIUM: 137 mmol/L (ref 135–145)
TCO2: 25 mmol/L (ref 0–100)

## 2014-07-28 MED ORDER — PENTAFLUOROPROP-TETRAFLUOROETH EX AERO: 1.0000 "application " | INHALATION_SPRAY | CUTANEOUS | Status: DC | PRN

## 2014-07-28 MED ORDER — LIDOCAINE HCL (PF) 1 % IJ SOLN: 5.0000 mL | INTRAMUSCULAR | Status: DC | PRN

## 2014-07-28 MED ORDER — LIDOCAINE-PRILOCAINE 2.5-2.5 % EX CREA: 1.0000 "application " | TOPICAL_CREAM | CUTANEOUS | Status: DC | PRN

## 2014-07-28 MED ORDER — SODIUM CHLORIDE 0.9 % IV SOLN: 100.0000 mL | INTRAVENOUS | Status: DC | PRN

## 2014-07-28 MED ORDER — HEPARIN SODIUM (PORCINE) 1000 UNIT/ML DIALYSIS: 1000.0000 [IU] | INTRAMUSCULAR | Status: DC | PRN

## 2014-07-28 MED ORDER — HEPARIN SODIUM (PORCINE) 1000 UNIT/ML DIALYSIS: 100.0000 [IU]/kg | INTRAMUSCULAR | Status: DC | PRN

## 2014-07-28 MED ORDER — ALTEPLASE 2 MG IJ SOLR: 2.0000 mg | Freq: Once | INTRAMUSCULAR | Status: DC | PRN

## 2014-07-28 MED ORDER — NEPRO/CARBSTEADY PO LIQD: 237.0000 mL | ORAL | Status: DC | PRN
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
## 2014-07-28 NOTE — ED Notes (Signed)
Patient coming from home.  Patient needs dialysis.  Patient normally gets dialysis on Monday, Wednesday and Friday.  Patient is waiting on doctor to find her a dialysis center.

## 2014-07-28 NOTE — ED Notes (Signed)
Sara Holiday, MD and Gertie Fey, [place] notified of abnormal lab test results

## 2014-07-28 NOTE — Progress Notes (Signed)
Pt discharged home; pt denies pain, nausea/vomitting, SOB or dizziness. Pt signed off the tx with 25 mins left; she was given the early termination form to sign.

## 2014-07-28 NOTE — Procedures (Signed)
I was present at this session.  I have reviewed the session itself and made appropriate changes.  HD via RUA avf, vol xs.  Darrien Laakso L 4/22/201610:40 AM

## 2014-07-31 ENCOUNTER — Encounter (HOSPITAL_COMMUNITY): Payer: Self-pay | Admitting: Physical Medicine and Rehabilitation

## 2014-07-31 ENCOUNTER — Emergency Department (HOSPITAL_COMMUNITY)
Admission: EM | Admit: 2014-07-31 | Discharge: 2014-07-31 | Disposition: A | Discharge status: Home / Self Care | Payer: Medicare Other | Attending: Emergency Medicine | Admitting: Emergency Medicine

## 2014-07-31 DIAGNOSIS — Z79899 Other long term (current) drug therapy: Secondary | ICD-10-CM | POA: Insufficient documentation

## 2014-07-31 DIAGNOSIS — F29 Unspecified psychosis not due to a substance or known physiological condition: Secondary | ICD-10-CM | POA: Diagnosis not present

## 2014-07-31 DIAGNOSIS — Z862 Personal history of diseases of the blood and blood-forming organs and certain disorders involving the immune mechanism: Secondary | ICD-10-CM | POA: Insufficient documentation

## 2014-07-31 DIAGNOSIS — I120 Hypertensive chronic kidney disease with stage 5 chronic kidney disease or end stage renal disease: Secondary | ICD-10-CM | POA: Diagnosis not present

## 2014-07-31 DIAGNOSIS — N186 End stage renal disease: Secondary | ICD-10-CM | POA: Insufficient documentation

## 2014-07-31 DIAGNOSIS — R000 Tachycardia, unspecified: Secondary | ICD-10-CM | POA: Insufficient documentation

## 2014-07-31 DIAGNOSIS — Z4931 Encounter for adequacy testing for hemodialysis: Secondary | ICD-10-CM | POA: Diagnosis present

## 2014-07-31 DIAGNOSIS — Z992 Dependence on renal dialysis: Secondary | ICD-10-CM | POA: Insufficient documentation

## 2014-07-31 LAB — CBC
HCT: 27 % — ABNORMAL LOW (ref 36.0–46.0)
HEMOGLOBIN: 8.4 g/dL — AB (ref 12.0–15.0)
MCH: 27.1 pg (ref 26.0–34.0)
MCHC: 31.1 g/dL (ref 30.0–36.0)
MCV: 87.1 fL (ref 78.0–100.0)
Platelets: 188 10*3/uL (ref 150–400)
RBC: 3.1 MIL/uL — AB (ref 3.87–5.11)
RDW: 21.9 % — ABNORMAL HIGH (ref 11.5–15.5)
WBC: 5.3 10*3/uL (ref 4.0–10.5)

## 2014-07-31 LAB — RENAL FUNCTION PANEL
ALBUMIN: 2.8 g/dL — AB (ref 3.5–5.2)
ANION GAP: 14 (ref 5–15)
BUN: 54 mg/dL — AB (ref 6–23)
CALCIUM: 9.3 mg/dL (ref 8.4–10.5)
CHLORIDE: 101 mmol/L (ref 96–112)
CO2: 22 mmol/L (ref 19–32)
CREATININE: 12.33 mg/dL — AB (ref 0.50–1.10)
GFR calc Af Amer: 4 mL/min — ABNORMAL LOW (ref >90)
GFR calc non Af Amer: 4 mL/min — ABNORMAL LOW (ref >90)
GLUCOSE: 83 mg/dL (ref 70–99)
POTASSIUM: 4.7 mmol/L (ref 3.5–5.1)
Phosphorus: 9.7 mg/dL — ABNORMAL HIGH (ref 2.3–4.6)
Sodium: 137 mmol/L (ref 135–145)

## 2014-07-31 MED ORDER — ACETAMINOPHEN 325 MG PO TABS
650.0000 mg | ORAL_TABLET | Freq: Once | ORAL | Status: AC
  Administered 2014-07-31: 650 mg via ORAL

## 2014-07-31 MED ORDER — ACETAMINOPHEN 325 MG PO TABS
ORAL_TABLET | ORAL | Status: AC
  Filled 2014-07-31: qty 2

## 2014-07-31 MED ORDER — DARBEPOETIN ALFA 60 MCG/0.3ML IJ SOSY
PREFILLED_SYRINGE | INTRAMUSCULAR | Status: AC
  Filled 2014-07-31: qty 0.3

## 2014-07-31 MED ORDER — DARBEPOETIN ALFA 60 MCG/0.3ML IJ SOSY
60.0000 ug | PREFILLED_SYRINGE | INTRAMUSCULAR | Status: DC
  Administered 2014-07-31: 60 ug via INTRAVENOUS

## 2014-07-31 NOTE — ED Notes (Signed)
Pt presents to department for hemodialysis, last treatment on Friday. Reports chronic lower back pain. Pt is alert and oriented x4.

## 2014-07-31 NOTE — Progress Notes (Signed)
Patient achieved goal of 6kg removal. Blood pressure has improved. Blood pressure of 141/83. No verbalized concerns at discharge. Patient has been seen by Dr Posey Pronto and is approved for discharge.
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
## 2014-07-31 NOTE — ED Notes (Signed)
Report given to HD nurse, pt transported to dialysis for treatment. Vital signs stable. NAD.

## 2014-07-31 NOTE — Procedures (Signed)
Patient seen on Hemodialysis. QB 400, UF goal 6.5L Treatment adjusted as needed.  Elmarie Shiley MD [facility]. Office # [phone] Pager # [phone] 10:01 AM

## 2014-07-31 NOTE — ED Notes (Signed)
Pt to ED for hemodialysis, last treatment on Friday, no complications. Fistula to R arm intact. No signs of distress noted. Pt is alert and oriented x4.

## 2014-08-02 ENCOUNTER — Encounter (HOSPITAL_COMMUNITY): Payer: Self-pay | Admitting: *Deleted

## 2014-08-02 ENCOUNTER — Non-Acute Institutional Stay (HOSPITAL_COMMUNITY)
Admission: EM | Admit: 2014-08-02 | Discharge: 2014-08-02 | Disposition: A | Discharge status: Home / Self Care | Payer: Medicare Other | Attending: Nephrology | Admitting: Nephrology

## 2014-08-02 DIAGNOSIS — Z992 Dependence on renal dialysis: Secondary | ICD-10-CM | POA: Insufficient documentation

## 2014-08-02 DIAGNOSIS — N19 Unspecified kidney failure: Secondary | ICD-10-CM | POA: Diagnosis present

## 2014-08-02 DIAGNOSIS — N186 End stage renal disease: Secondary | ICD-10-CM | POA: Diagnosis present

## 2014-08-02 DIAGNOSIS — Z3202 Encounter for pregnancy test, result negative: Secondary | ICD-10-CM | POA: Insufficient documentation

## 2014-08-02 HISTORY — DX: Low back pain, unspecified: M54.50

## 2014-08-02 HISTORY — DX: Low back pain: M54.5

## 2014-08-02 LAB — CBC
HCT: 28.1 % — ABNORMAL LOW (ref 36.0–46.0)
Hemoglobin: 8.7 g/dL — ABNORMAL LOW (ref 12.0–15.0)
MCH: 26.9 pg (ref 26.0–34.0)
MCHC: 31 g/dL (ref 30.0–36.0)
MCV: 86.7 fL (ref 78.0–100.0)
PLATELETS: 178 10*3/uL (ref 150–400)
RBC: 3.24 MIL/uL — ABNORMAL LOW (ref 3.87–5.11)
RDW: 20.8 % — AB (ref 11.5–15.5)
WBC: 4.1 10*3/uL (ref 4.0–10.5)

## 2014-08-02 LAB — I-STAT CHEM 8, ED
BUN: 42 mg/dL — AB (ref 6–23)
CALCIUM ION: 1.16 mmol/L (ref 1.12–1.23)
CHLORIDE: 102 mmol/L (ref 96–112)
Creatinine, Ser: 11.2 mg/dL — ABNORMAL HIGH (ref 0.50–1.10)
Glucose, Bld: 77 mg/dL (ref 70–99)
HEMATOCRIT: 31 % — AB (ref 36.0–46.0)
HEMOGLOBIN: 10.5 g/dL — AB (ref 12.0–15.0)
Potassium: 4.6 mmol/L (ref 3.5–5.1)
Sodium: 138 mmol/L (ref 135–145)
TCO2: 22 mmol/L (ref 0–100)

## 2014-08-02 LAB — URINALYSIS, ROUTINE W REFLEX MICROSCOPIC
Bilirubin Urine: NEGATIVE
GLUCOSE, UA: 100 mg/dL — AB
Ketones, ur: NEGATIVE mg/dL
LEUKOCYTES UA: NEGATIVE
Nitrite: NEGATIVE
PH: 8 (ref 5.0–8.0)
Specific Gravity, Urine: 1.02 (ref 1.005–1.030)
Urobilinogen, UA: 0.2 mg/dL (ref 0.0–1.0)

## 2014-08-02 LAB — RENAL FUNCTION PANEL
ALBUMIN: 2.7 g/dL — AB (ref 3.5–5.2)
Anion gap: 14 (ref 5–15)
BUN: 47 mg/dL — AB (ref 6–23)
CALCIUM: 9.1 mg/dL (ref 8.4–10.5)
CHLORIDE: 102 mmol/L (ref 96–112)
CO2: 22 mmol/L (ref 19–32)
Creatinine, Ser: 11.05 mg/dL — ABNORMAL HIGH (ref 0.50–1.10)
GFR calc non Af Amer: 4 mL/min — ABNORMAL LOW (ref >90)
GFR, EST AFRICAN AMERICAN: 5 mL/min — AB (ref >90)
GLUCOSE: 74 mg/dL (ref 70–99)
POTASSIUM: 4.8 mmol/L (ref 3.5–5.1)
Phosphorus: 10.9 mg/dL — ABNORMAL HIGH (ref 2.3–4.6)
SODIUM: 138 mmol/L (ref 135–145)

## 2014-08-02 LAB — URINE MICROSCOPIC-ADD ON

## 2014-08-02 LAB — PREGNANCY, URINE: Preg Test, Ur: NEGATIVE

## 2014-08-02 MED ORDER — ALTEPLASE 2 MG IJ SOLR
2.0000 mg | Freq: Once | INTRAMUSCULAR | Status: DC | PRN
  Filled 2014-08-02: qty 2

## 2014-08-02 MED ORDER — LIDOCAINE-PRILOCAINE 2.5-2.5 % EX CREA: 1.0000 "application " | TOPICAL_CREAM | CUTANEOUS | Status: DC | PRN

## 2014-08-02 MED ORDER — PENTAFLUOROPROP-TETRAFLUOROETH EX AERO: 1.0000 "application " | INHALATION_SPRAY | CUTANEOUS | Status: DC | PRN

## 2014-08-02 MED ORDER — HEPARIN SODIUM (PORCINE) 1000 UNIT/ML DIALYSIS: 1000.0000 [IU] | INTRAMUSCULAR | Status: DC | PRN

## 2014-08-02 MED ORDER — NEPRO/CARBSTEADY PO LIQD: 237.0000 mL | ORAL | Status: DC | PRN

## 2014-08-02 MED ORDER — HEPARIN SODIUM (PORCINE) 1000 UNIT/ML DIALYSIS: 40.0000 [IU]/kg | INTRAMUSCULAR | Status: DC | PRN

## 2014-08-02 MED ORDER — LIDOCAINE-PRILOCAINE 2.5-2.5 % EX CREA
1.0000 "application " | TOPICAL_CREAM | CUTANEOUS | Status: DC | PRN
  Filled 2014-08-02: qty 5

## 2014-08-02 MED ORDER — SODIUM CHLORIDE 0.9 % IV SOLN: 100.0000 mL | INTRAVENOUS | Status: DC | PRN

## 2014-08-02 MED ORDER — LIDOCAINE HCL (PF) 1 % IJ SOLN: 5.0000 mL | INTRAMUSCULAR | Status: DC | PRN

## 2014-08-02 MED ORDER — NEPRO/CARBSTEADY PO LIQD
237.0000 mL | ORAL | Status: DC | PRN
  Filled 2014-08-02: qty 237

## 2014-08-02 MED ORDER — ALTEPLASE 2 MG IJ SOLR: 2.0000 mg | Freq: Once | INTRAMUSCULAR | Status: DC | PRN

## 2014-08-02 NOTE — ED Notes (Signed)
Pt needs dialysis.  Pt states she has pain in her right lower back.  Denies burning or urgency.

## 2014-08-02 NOTE — Procedures (Signed)
Sara [name] reports to be tired and with some nausea this morning. She denies any chest pain/SOB. She expresses remorse about her past behavior and requests to be placed at an OP HD unit. She has been compliant with coming to the ER for HD.   Patient seen on Hemodialysis. QB 400, UF goal 6.5L Treatment adjusted as needed.  Elmarie Shiley MD [facility]. Office # [phone] Pager # [phone] 11:05 AM

## 2014-08-02 NOTE — ED Notes (Signed)
Pt is here for her dialysis treatment.
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
## 2014-08-02 NOTE — Progress Notes (Signed)
Hemodialysis treatment complete without adverse events.  UF goal met without difficulty. VSS and pt without complaint.  Pt discharged to home with self care.

## 2014-08-02 NOTE — ED Notes (Signed)
Off floor with dialysis

## 2014-08-03 LAB — URINE CULTURE: Colony Count: 25000

## 2014-08-05 ENCOUNTER — Non-Acute Institutional Stay (HOSPITAL_COMMUNITY)
Admission: EM | Admit: 2014-08-05 | Discharge: 2014-08-07 | Disposition: A | Discharge status: Home / Self Care | Payer: Medicare Other | Attending: Emergency Medicine | Admitting: Emergency Medicine

## 2014-08-05 ENCOUNTER — Encounter (HOSPITAL_COMMUNITY): Payer: Self-pay | Admitting: Emergency Medicine

## 2014-08-05 DIAGNOSIS — F319 Bipolar disorder, unspecified: Secondary | ICD-10-CM | POA: Insufficient documentation

## 2014-08-05 DIAGNOSIS — N186 End stage renal disease: Secondary | ICD-10-CM | POA: Insufficient documentation

## 2014-08-05 DIAGNOSIS — Z4931 Encounter for adequacy testing for hemodialysis: Secondary | ICD-10-CM | POA: Diagnosis not present

## 2014-08-05 DIAGNOSIS — Z79899 Other long term (current) drug therapy: Secondary | ICD-10-CM | POA: Insufficient documentation

## 2014-08-05 DIAGNOSIS — Z881 Allergy status to other antibiotic agents status: Secondary | ICD-10-CM | POA: Diagnosis not present

## 2014-08-05 DIAGNOSIS — Z992 Dependence on renal dialysis: Secondary | ICD-10-CM

## 2014-08-05 DIAGNOSIS — M329 Systemic lupus erythematosus, unspecified: Secondary | ICD-10-CM | POA: Diagnosis not present

## 2014-08-05 DIAGNOSIS — M3214 Glomerular disease in systemic lupus erythematosus: Secondary | ICD-10-CM | POA: Insufficient documentation

## 2014-08-05 DIAGNOSIS — I120 Hypertensive chronic kidney disease with stage 5 chronic kidney disease or end stage renal disease: Secondary | ICD-10-CM | POA: Insufficient documentation

## 2014-08-05 DIAGNOSIS — F209 Schizophrenia, unspecified: Secondary | ICD-10-CM | POA: Insufficient documentation

## 2014-08-05 DIAGNOSIS — D649 Anemia, unspecified: Secondary | ICD-10-CM | POA: Diagnosis not present

## 2014-08-05 DIAGNOSIS — F17210 Nicotine dependence, cigarettes, uncomplicated: Secondary | ICD-10-CM | POA: Insufficient documentation

## 2014-08-05 DIAGNOSIS — M545 Low back pain: Secondary | ICD-10-CM | POA: Insufficient documentation

## 2014-08-05 DIAGNOSIS — Z888 Allergy status to other drugs, medicaments and biological substances status: Secondary | ICD-10-CM | POA: Insufficient documentation

## 2014-08-05 LAB — RENAL FUNCTION PANEL
Albumin: 3 g/dL — ABNORMAL LOW (ref 3.5–5.2)
Anion gap: 15 (ref 5–15)
BUN: 59 mg/dL — ABNORMAL HIGH (ref 6–23)
CO2: 25 mmol/L (ref 19–32)
Calcium: 9.4 mg/dL (ref 8.4–10.5)
Chloride: 98 mmol/L (ref 96–112)
Creatinine, Ser: 12.18 mg/dL — ABNORMAL HIGH (ref 0.50–1.10)
GFR calc Af Amer: 4 mL/min — ABNORMAL LOW (ref >90)
GFR calc non Af Amer: 4 mL/min — ABNORMAL LOW (ref >90)
Glucose, Bld: 69 mg/dL — ABNORMAL LOW (ref 70–99)
Phosphorus: 11.6 mg/dL — ABNORMAL HIGH (ref 2.3–4.6)
Potassium: 4.8 mmol/L (ref 3.5–5.1)
Sodium: 138 mmol/L (ref 135–145)

## 2014-08-05 LAB — CBC
HCT: 29.4 % — ABNORMAL LOW (ref 36.0–46.0)
Hemoglobin: 9.1 g/dL — ABNORMAL LOW (ref 12.0–15.0)
MCH: 26.7 pg (ref 26.0–34.0)
MCHC: 31 g/dL (ref 30.0–36.0)
MCV: 86.2 fL (ref 78.0–100.0)
Platelets: 248 10*3/uL (ref 150–400)
RBC: 3.41 MIL/uL — ABNORMAL LOW (ref 3.87–5.11)
RDW: 20 % — ABNORMAL HIGH (ref 11.5–15.5)
WBC: 4.1 10*3/uL (ref 4.0–10.5)

## 2014-08-05 LAB — I-STAT CHEM 8, ED
BUN: 55 mg/dL — ABNORMAL HIGH (ref 6–23)
CALCIUM ION: 1.18 mmol/L (ref 1.12–1.23)
CHLORIDE: 100 mmol/L (ref 96–112)
Creatinine, Ser: 12.3 mg/dL — ABNORMAL HIGH (ref 0.50–1.10)
Glucose, Bld: 77 mg/dL (ref 70–99)
HEMATOCRIT: 32 % — AB (ref 36.0–46.0)
Hemoglobin: 10.9 g/dL — ABNORMAL LOW (ref 12.0–15.0)
Potassium: 4.8 mmol/L (ref 3.5–5.1)
Sodium: 136 mmol/L (ref 135–145)
TCO2: 22 mmol/L (ref 0–100)

## 2014-08-05 MED ORDER — NEPRO/CARBSTEADY PO LIQD: 237.0000 mL | ORAL | Status: DC | PRN

## 2014-08-05 MED ORDER — PENTAFLUOROPROP-TETRAFLUOROETH EX AERO: 1.0000 "application " | INHALATION_SPRAY | CUTANEOUS | Status: DC | PRN

## 2014-08-05 MED ORDER — SODIUM CHLORIDE 0.9 % IV SOLN: 100.0000 mL | INTRAVENOUS | Status: DC | PRN

## 2014-08-05 MED ORDER — LIDOCAINE-PRILOCAINE 2.5-2.5 % EX CREA: 1.0000 "application " | TOPICAL_CREAM | CUTANEOUS | Status: DC | PRN

## 2014-08-05 MED ORDER — HEPARIN SODIUM (PORCINE) 1000 UNIT/ML DIALYSIS: 100.0000 [IU]/kg | INTRAMUSCULAR | Status: DC | PRN

## 2014-08-05 MED ORDER — HEPARIN SODIUM (PORCINE) 1000 UNIT/ML DIALYSIS: 1000.0000 [IU] | INTRAMUSCULAR | Status: DC | PRN

## 2014-08-05 MED ORDER — DARBEPOETIN ALFA 200 MCG/0.4ML IJ SOSY: 200.0000 ug | PREFILLED_SYRINGE | INTRAMUSCULAR | Status: DC

## 2014-08-05 MED ORDER — LIDOCAINE HCL (PF) 1 % IJ SOLN: 5.0000 mL | INTRAMUSCULAR | Status: DC | PRN

## 2014-08-05 MED ORDER — ALTEPLASE 2 MG IJ SOLR: 2.0000 mg | Freq: Once | INTRAMUSCULAR | Status: AC | PRN

## 2014-08-05 NOTE — ED Notes (Signed)
HD aware of pt to come to HD, estimated time before pt to be transported to HD is 30-60 mins per HD staff

## 2014-08-05 NOTE — Procedures (Signed)
I was present at this dialysis session. I have reviewed the session itself and made appropriate changes.   Discussed pt purusing behavioral contract at HD unit as way of expressing sincerity of desire to return to outpt HD. Goal UF 6.5L.    Plan for DC home after HD  Pearson Grippe  MD 08/05/2014, 11:49 AM

## 2014-08-05 NOTE — ED Notes (Signed)
PT working on getting outpatient dialysis treatment but has been coming here for the past month and a half for dialysis M, W, F. Missed yesterday. Denies swelling. Here for dialysis.
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
## 2014-08-07 ENCOUNTER — Encounter (HOSPITAL_COMMUNITY): Payer: Self-pay | Admitting: *Deleted

## 2014-08-07 ENCOUNTER — Emergency Department (HOSPITAL_COMMUNITY)
Admission: EM | Admit: 2014-08-07 | Discharge: 2014-08-07 | Disposition: A | Discharge status: Home / Self Care | Payer: Medicare Other | Attending: Emergency Medicine | Admitting: Emergency Medicine

## 2014-08-07 ENCOUNTER — Emergency Department (HOSPITAL_COMMUNITY): Payer: Medicare Other

## 2014-08-07 DIAGNOSIS — E875 Hyperkalemia: Secondary | ICD-10-CM | POA: Insufficient documentation

## 2014-08-07 DIAGNOSIS — Z992 Dependence on renal dialysis: Secondary | ICD-10-CM | POA: Diagnosis not present

## 2014-08-07 DIAGNOSIS — Z8739 Personal history of other diseases of the musculoskeletal system and connective tissue: Secondary | ICD-10-CM | POA: Diagnosis not present

## 2014-08-07 DIAGNOSIS — Z4931 Encounter for adequacy testing for hemodialysis: Secondary | ICD-10-CM | POA: Diagnosis present

## 2014-08-07 DIAGNOSIS — Z79899 Other long term (current) drug therapy: Secondary | ICD-10-CM | POA: Diagnosis not present

## 2014-08-07 DIAGNOSIS — N186 End stage renal disease: Secondary | ICD-10-CM | POA: Insufficient documentation

## 2014-08-07 DIAGNOSIS — J811 Chronic pulmonary edema: Secondary | ICD-10-CM | POA: Insufficient documentation

## 2014-08-07 DIAGNOSIS — I120 Hypertensive chronic kidney disease with stage 5 chronic kidney disease or end stage renal disease: Secondary | ICD-10-CM | POA: Diagnosis not present

## 2014-08-07 DIAGNOSIS — F209 Schizophrenia, unspecified: Secondary | ICD-10-CM | POA: Insufficient documentation

## 2014-08-07 DIAGNOSIS — Z862 Personal history of diseases of the blood and blood-forming organs and certain disorders involving the immune mechanism: Secondary | ICD-10-CM | POA: Diagnosis not present

## 2014-08-07 DIAGNOSIS — Z791 Long term (current) use of non-steroidal anti-inflammatories (NSAID): Secondary | ICD-10-CM | POA: Insufficient documentation

## 2014-08-07 DIAGNOSIS — Z720 Tobacco use: Secondary | ICD-10-CM | POA: Insufficient documentation

## 2014-08-07 LAB — I-STAT CHEM 8, ED
BUN: 62 mg/dL — ABNORMAL HIGH (ref 6–20)
Calcium, Ion: 1.13 mmol/L (ref 1.12–1.23)
Chloride: 98 mmol/L — ABNORMAL LOW (ref 101–111)
Creatinine, Ser: 11.3 mg/dL — ABNORMAL HIGH (ref 0.44–1.00)
Glucose, Bld: 78 mg/dL (ref 70–99)
HCT: 32 % — ABNORMAL LOW (ref 36.0–46.0)
Hemoglobin: 10.9 g/dL — ABNORMAL LOW (ref 12.0–15.0)
Potassium: 5.2 mmol/L — ABNORMAL HIGH (ref 3.5–5.1)
Sodium: 133 mmol/L — ABNORMAL LOW (ref 135–145)
TCO2: 23 mmol/L (ref 0–100)

## 2014-08-07 NOTE — ED Notes (Signed)
Transported up by Otila Kluver NT

## 2014-08-07 NOTE — ED Notes (Addendum)
Report given to [facility] RN

## 2014-08-07 NOTE — ED Notes (Signed)
Gave patient a sprite to drink

## 2014-08-07 NOTE — Procedures (Signed)
I was present at this dialysis session, have reviewed the session itself and made  appropriate changes  Ms Zook returns for OP dialysis, she has not OP HD unit. She is doing much much better with her fluid gains and today is only about 3kg over her "dry wt".  She says " i have to do good".  CXR reviewed w no vol excess.   Kelly Splinter MD (pgr) [phone]    ([phone] 08/07/2014, 2:07 PM
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
## 2014-08-07 NOTE — Discharge Instructions (Signed)
Dialysis °Dialysis is a procedure that replaces some of the work healthy kidneys do. It is done when you lose about 85-90% of your kidney function. It may also be done earlier if your symptoms may be improved by dialysis. During dialysis, wastes, salt, and extra water are removed from the blood, and the levels of certain chemicals in the blood (such as potassium) are maintained. Dialysis is done in sessions. Dialysis sessions are continued until the kidneys get better. If the kidneys cannot get better, such as in end-stage kidney disease, dialysis is continued for life or until you receive a new kidney (kidney transplant). There are two types of dialysis: hemodialysis and peritoneal dialysis. °WHAT IS HEMODIALYSIS?  °Hemodialysis is a type of dialysis in which a machine called a dialyzer is used to filter the blood. Before beginning hemodialysis, you will have surgery to create a site where blood can be removed from the body and returned to the body (vascular access). There are three types of vascular accesses: °· Arteriovenous fistula. To create this type of access, an artery is connected to a vein (usually in the arm). A fistula takes 1-6 months to develop after surgery. If it develops properly, it usually lasts longer than the other types of vascular accesses. It is also less likely to become infected and cause blood clots. °· Arteriovenous graft. To create this type of access, an artery and a vein in the arm are connected with a tube. A graft may be used within 2-3 weeks of surgery. °· A venous catheter. To create this type of access, a thin, flexible tube (catheter) is placed in a large vein in your neck, chest, or groin. A catheter may be used right away. It is usually used as a temporary access when dialysis needs to begin immediately. °During hemodialysis, blood leaves the body through your access. It travels through a tube to the dialyzer, where it is filtered. The blood then returns to your body through  another tube. °Hemodialysis is usually performed by a health care provider at a hospital or dialysis center three times a week. Visits last about 3-4 hours. It may also be performed with the help of another person at home with training.  °WHAT IS PERITONEAL DIALYSIS? °Peritoneal dialysis is a type of dialysis in which the thin lining of the abdomen (peritoneum) is used as a filter. Before beginning peritoneal dialysis, you will have surgery to place a catheter in your abdomen. The catheter will be used to transfer a fluid called dialysate to and from your abdomen. At the start of a session, your abdomen is filled with dialysate. During the session, wastes, salt, and extra water in the blood pass through the peritoneum and into the dialysate. The dialysate is drained from the body at the end of the session. The process of filling and draining the dialysate is called an exchange. Exchanges are repeated until you have used up all the dialysate for the day. °Peritoneal dialysis may be performed by you at home or at almost any other location. It is done every day. You may need up to five exchanges a day. The amount of time the dialysate is in your body between exchanges is called a dwell. The dwell depends on the number of exchanges needed and the characteristics of the peritoneum. It usually varies from 1.5-3 hours. You may go about your day normally between exchanges. Alternately, the exchanges may be done at night while you sleep, using a machine called a cycler. °WHICH TYPE   while you sleep, using a machine called a cycler.  WHICH TYPE OF DIALYSIS SHOULD I CHOOSE?   Both hemodialysis and peritoneal dialysis have advantages and disadvantages. Talk to your health care provider about which type of dialysis would be best for you. Your lifestyle and preferences should be considered along with your medical condition. In some cases, only one type of dialysis may be an option.   Advantages of hemodialysis  · It is done less often than peritoneal dialysis.  · Someone else can do the  dialysis for you.  · If you go to a dialysis center, your health care provider will be able to recognize any problems right away.  · If you go to a dialysis center, you can interact with others who are having dialysis. This can provide you with emotional support.  Disadvantages of hemodialysis  · Hemodialysis may cause cramps and low blood pressure. It may leave you feeling tired on the days you have the treatment.  · If you go to a dialysis center, you will need to make weekly appointments and work around the center's schedule.  · You will need to take extra care when traveling. If you go to a dialysis center, you will need to make special arrangements to visit a dialysis center near your destination. If you are having treatments at home, you will need to take the dialyzer with you to your destination.  · You will need to avoid more foods than you would need to avoid on peritoneal dialysis.  Advantages of peritoneal dialysis  · It is less likely than hemodialysis to cause cramps and low blood pressure.  · You may do exchanges on your own wherever you are, including when you travel.  · You do not need to avoid as many foods as you do on hemodialysis.  Disadvantages of peritoneal dialysis  · It is done more often than hemodialysis.  · Performing peritoneal dialysis requires you to have dexterity of the hands. You must also be able to lift bags.  · You will have to learn sterilization techniques. You will need to practice them every day to reduce the risk of infection.   WHAT CHANGES WILL I NEED TO MAKE TO MY DIET DURING DIALYSIS?  Both hemodialysis and peritoneal dialysis require you to make some changes to your diet. For example, you will need to limit your intake of foods high in the minerals phosphorus and potassium. You will also need to limit your fluid intake. Your dietitian can help you plan meals. A good meal plan can improve your dialysis and your health.   WHAT SHOULD I EXPECT WHEN BEGINNING  DIALYSIS?  Adjusting to the dialysis treatment, schedule, and diet can take some time. You may need to stop working and may not be able to do some of the things you normally do. You may feel anxious or depressed when beginning dialysis. Eventually, many people feel better overall because of dialysis. Some people are able to return to work after making some changes, such as reducing work intensity.  WHERE CAN I FIND MORE INFORMATION?   · National Kidney Foundation: www.kidney.org  · American Association of Kidney Patients: www.aakp.org  · American Kidney Fund: www.kidneyfund.org  Document Released: 06/14/2002 Document Revised: 08/08/2013 Document Reviewed: 05/18/2012  ExitCare® Patient Information ©2015 [employer]. This information is not intended to replace advice given to you by your health care provider. Make sure you discuss any questions you have with your health care provider.

## 2014-08-07 NOTE — ED Notes (Signed)
Pt is here for her regular dialysis.  Denies any s/s or pain.

## 2014-08-11 ENCOUNTER — Non-Acute Institutional Stay (HOSPITAL_COMMUNITY)
Admission: EM | Admit: 2014-08-11 | Discharge: 2014-08-11 | Disposition: A | Discharge status: Home / Self Care | Payer: Medicare Other | Attending: Emergency Medicine | Admitting: Emergency Medicine

## 2014-08-11 ENCOUNTER — Emergency Department (HOSPITAL_COMMUNITY): Payer: Medicare Other

## 2014-08-11 ENCOUNTER — Encounter (HOSPITAL_COMMUNITY): Payer: Self-pay | Admitting: Emergency Medicine

## 2014-08-11 DIAGNOSIS — M25551 Pain in right hip: Secondary | ICD-10-CM | POA: Diagnosis not present

## 2014-08-11 DIAGNOSIS — F17210 Nicotine dependence, cigarettes, uncomplicated: Secondary | ICD-10-CM | POA: Diagnosis not present

## 2014-08-11 DIAGNOSIS — M329 Systemic lupus erythematosus, unspecified: Secondary | ICD-10-CM | POA: Insufficient documentation

## 2014-08-11 DIAGNOSIS — Z992 Dependence on renal dialysis: Secondary | ICD-10-CM | POA: Diagnosis not present

## 2014-08-11 DIAGNOSIS — I120 Hypertensive chronic kidney disease with stage 5 chronic kidney disease or end stage renal disease: Secondary | ICD-10-CM | POA: Insufficient documentation

## 2014-08-11 DIAGNOSIS — N186 End stage renal disease: Secondary | ICD-10-CM | POA: Diagnosis not present

## 2014-08-11 DIAGNOSIS — F319 Bipolar disorder, unspecified: Secondary | ICD-10-CM | POA: Diagnosis not present

## 2014-08-11 LAB — RENAL FUNCTION PANEL
ALBUMIN: 3.1 g/dL — AB (ref 3.5–5.0)
ANION GAP: 16 — AB (ref 5–15)
BUN: 77 mg/dL — ABNORMAL HIGH (ref 6–20)
CHLORIDE: 98 mmol/L — AB (ref 101–111)
CO2: 20 mmol/L — ABNORMAL LOW (ref 22–32)
CREATININE: 14.19 mg/dL — AB (ref 0.44–1.00)
Calcium: 9.3 mg/dL (ref 8.9–10.3)
GFR calc Af Amer: 3 mL/min — ABNORMAL LOW (ref >60)
GFR, EST NON AFRICAN AMERICAN: 3 mL/min — AB (ref >60)
Glucose, Bld: 83 mg/dL (ref 70–99)
POTASSIUM: 5.8 mmol/L — AB (ref 3.5–5.1)
Phosphorus: 13.2 mg/dL — ABNORMAL HIGH (ref 2.5–4.6)
Sodium: 134 mmol/L — ABNORMAL LOW (ref 135–145)

## 2014-08-11 LAB — CBC
HCT: 28.5 % — ABNORMAL LOW (ref 36.0–46.0)
Hemoglobin: 9 g/dL — ABNORMAL LOW (ref 12.0–15.0)
MCH: 26.4 pg (ref 26.0–34.0)
MCHC: 31.6 g/dL (ref 30.0–36.0)
MCV: 83.6 fL (ref 78.0–100.0)
Platelets: 207 10*3/uL (ref 150–400)
RBC: 3.41 MIL/uL — ABNORMAL LOW (ref 3.87–5.11)
RDW: 19.4 % — AB (ref 11.5–15.5)
WBC: 3.8 10*3/uL — ABNORMAL LOW (ref 4.0–10.5)

## 2014-08-11 MED ORDER — NEPRO/CARBSTEADY PO LIQD: 237.0000 mL | ORAL | Status: DC | PRN

## 2014-08-11 MED ORDER — LIDOCAINE-PRILOCAINE 2.5-2.5 % EX CREA: 1.0000 "application " | TOPICAL_CREAM | CUTANEOUS | Status: DC | PRN

## 2014-08-11 MED ORDER — SODIUM CHLORIDE 0.9 % IV SOLN: 100.0000 mL | INTRAVENOUS | Status: DC | PRN

## 2014-08-11 MED ORDER — DARBEPOETIN ALFA 40 MCG/0.4ML IJ SOSY
40.0000 ug | PREFILLED_SYRINGE | Freq: Once | INTRAMUSCULAR | Status: AC
  Administered 2014-08-11: 40 ug via INTRAVENOUS

## 2014-08-11 MED ORDER — HEPARIN SODIUM (PORCINE) 1000 UNIT/ML DIALYSIS: 1000.0000 [IU] | INTRAMUSCULAR | Status: DC | PRN

## 2014-08-11 MED ORDER — DARBEPOETIN ALFA 40 MCG/0.4ML IJ SOSY
PREFILLED_SYRINGE | INTRAMUSCULAR | Status: AC
  Filled 2014-08-11: qty 0.4

## 2014-08-11 MED ORDER — HEPARIN SODIUM (PORCINE) 1000 UNIT/ML DIALYSIS: 20.0000 [IU]/kg | Freq: Once | INTRAMUSCULAR | Status: DC

## 2014-08-11 MED ORDER — PENTAFLUOROPROP-TETRAFLUOROETH EX AERO: 1.0000 "application " | INHALATION_SPRAY | CUTANEOUS | Status: DC | PRN

## 2014-08-11 MED ORDER — LIDOCAINE HCL (PF) 1 % IJ SOLN: 5.0000 mL | INTRAMUSCULAR | Status: DC | PRN

## 2014-08-11 MED ORDER — ALTEPLASE 2 MG IJ SOLR: 2.0000 mg | Freq: Once | INTRAMUSCULAR | Status: DC | PRN

## 2014-08-11 NOTE — ED Notes (Signed)
Patient transported to X-ray 

## 2014-08-11 NOTE — ED Notes (Signed)
Pt sts " I needs to do dialysis and my R hip started hurting yesterday and I think I need an xray". Pt sts pain increase with movement. Pt sts she did not take b/p medication this morning. BP 174/103, HR 108, 96% Rm air
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
## 2014-08-11 NOTE — Progress Notes (Signed)
Pt discharged home; signed of the machine with 51min left; pt denies SOB, dizziness, n/v. Pt walked off the unit to go a catch the bus home.

## 2014-08-11 NOTE — ED Notes (Signed)
Pt returned from xray

## 2014-08-11 NOTE — ED Notes (Signed)
Pt transported to dialysis, NAD, VSS on departure.  Report given to Erasmo Downer, Therapist, sports.

## 2014-08-12 NOTE — Discharge Summary (Deleted)
Physician Discharge Summary  Patient ID: Sara [name] MRN: [id number] DOB/AGE: 11-09-1980 34 y.o.  Admit date: 08/11/2014 Discharge date: 08/11/14 Discharge Diagnoses:  Active Problems:   ESRD (end stage renal disease) on dialysis   Discharged Condition: stable  Hospital Course: Pt presented to ED for routine HD, tolerated HD without complication. DCd home in stable condition post treamtment  Treatments: Hemodialysis  Blood pressure 161/80, pulse 113, temperature 99.5 F (37.5 C), temperature source Oral, resp. rate 28, weight 62.1 kg (136 lb 14.5 oz), last menstrual period 08/11/2011, SpO2 95 %.  Disposition: 01-Home or Self Care     Medication List    ASK your doctor about these medications        acetaminophen 500 MG tablet  Commonly known as:  TYLENOL  Take 1,000 mg by mouth every 6 (six) hours as needed for moderate pain.     calcium acetate 667 MG capsule  Commonly known as:  PHOSLO  Take 667-2,001 mg by mouth 4 (four) times daily. Take 2001 mg with breakfast, lunch and dinner, and 667 mg with a snack.     carvedilol 6.25 MG tablet  Commonly known as:  COREG  Take 1 tablet (6.25 mg total) by mouth 2 (two) times daily with a meal.     colchicine 0.6 MG tablet  Take 1 tablet (0.6 mg total) by mouth every other day.     haloperidol 5 MG tablet  Commonly known as:  HALDOL  Take 1 tablet (5 mg total) by mouth 2 (two) times daily.     hydrALAZINE 25 MG tablet  Commonly known as:  APRESOLINE  TAKE 1 TABLET (25 MG TOTAL) BY MOUTH 3 (THREE) TIMES DAILY.     ibuprofen 200 MG tablet  Commonly known as:  ADVIL  Take 1 tablet (200 mg total) by mouth 3 (three) times daily.     isosorbide mononitrate 30 MG 24 hr tablet  Commonly known as:  IMDUR  TAKE 1 TABLET (30 MG TOTAL) BY MOUTH DAILY.     metoprolol tartrate 25 MG tablet  Commonly known as:  LOPRESSOR  Take 25 mg by mouth 2 (two) times daily.     NYQUIL PO  Take 5 mLs by mouth daily as needed (sleep).         Signed: Ernest Mallick. NP [facility] Beeper [phone] 08/12/2014, 2:51 PM  Attending Physician:

## 2014-08-14 ENCOUNTER — Non-Acute Institutional Stay (HOSPITAL_COMMUNITY)
Admission: EM | Admit: 2014-08-14 | Discharge: 2014-08-14 | Disposition: A | Discharge status: Home / Self Care | Payer: Medicare Other | Attending: Emergency Medicine | Admitting: Emergency Medicine

## 2014-08-14 ENCOUNTER — Encounter (HOSPITAL_COMMUNITY): Payer: Self-pay | Admitting: Emergency Medicine

## 2014-08-14 DIAGNOSIS — Z992 Dependence on renal dialysis: Secondary | ICD-10-CM | POA: Insufficient documentation

## 2014-08-14 DIAGNOSIS — N186 End stage renal disease: Secondary | ICD-10-CM | POA: Diagnosis present

## 2014-08-14 DIAGNOSIS — F17210 Nicotine dependence, cigarettes, uncomplicated: Secondary | ICD-10-CM | POA: Insufficient documentation

## 2014-08-14 LAB — CBC
HEMATOCRIT: 26.6 % — AB (ref 36.0–46.0)
HEMOGLOBIN: 8.7 g/dL — AB (ref 12.0–15.0)
MCH: 26.6 pg (ref 26.0–34.0)
MCHC: 32.7 g/dL (ref 30.0–36.0)
MCV: 81.3 fL (ref 78.0–100.0)
Platelets: 189 10*3/uL (ref 150–400)
RBC: 3.27 MIL/uL — AB (ref 3.87–5.11)
RDW: 19 % — AB (ref 11.5–15.5)
WBC: 3.7 10*3/uL — ABNORMAL LOW (ref 4.0–10.5)

## 2014-08-14 LAB — RENAL FUNCTION PANEL
Albumin: 2.7 g/dL — ABNORMAL LOW (ref 3.5–5.0)
Anion gap: 16 — ABNORMAL HIGH (ref 5–15)
BUN: 69 mg/dL — ABNORMAL HIGH (ref 6–20)
CO2: 22 mmol/L (ref 22–32)
CREATININE: 13.57 mg/dL — AB (ref 0.44–1.00)
Calcium: 9.1 mg/dL (ref 8.9–10.3)
Chloride: 96 mmol/L — ABNORMAL LOW (ref 101–111)
GFR calc non Af Amer: 3 mL/min — ABNORMAL LOW (ref >60)
GFR, EST AFRICAN AMERICAN: 4 mL/min — AB (ref >60)
Glucose, Bld: 80 mg/dL (ref 70–99)
POTASSIUM: 5.2 mmol/L — AB (ref 3.5–5.1)
Phosphorus: 12.8 mg/dL — ABNORMAL HIGH (ref 2.5–4.6)
SODIUM: 134 mmol/L — AB (ref 135–145)

## 2014-08-14 LAB — HEPATITIS B SURFACE ANTIGEN: Hepatitis B Surface Ag: NEGATIVE

## 2014-08-14 MED ORDER — SODIUM CHLORIDE 0.9 % IV SOLN: 100.0000 mL | INTRAVENOUS | Status: DC | PRN

## 2014-08-14 MED ORDER — HEPARIN SODIUM (PORCINE) 1000 UNIT/ML DIALYSIS
20.0000 [IU]/kg | Freq: Once | INTRAMUSCULAR | Status: DC
  Filled 2014-08-14: qty 2

## 2014-08-14 MED ORDER — NEPRO/CARBSTEADY PO LIQD
237.0000 mL | ORAL | Status: DC | PRN
  Filled 2014-08-14: qty 237

## 2014-08-14 MED ORDER — HEPARIN SODIUM (PORCINE) 1000 UNIT/ML DIALYSIS
1000.0000 [IU] | INTRAMUSCULAR | Status: DC | PRN
  Filled 2014-08-14: qty 1

## 2014-08-14 MED ORDER — ALTEPLASE 2 MG IJ SOLR
2.0000 mg | Freq: Once | INTRAMUSCULAR | Status: DC | PRN
  Filled 2014-08-14: qty 2

## 2014-08-14 MED ORDER — LIDOCAINE-PRILOCAINE 2.5-2.5 % EX CREA
1.0000 "application " | TOPICAL_CREAM | CUTANEOUS | Status: DC | PRN
  Filled 2014-08-14: qty 5

## 2014-08-14 MED ORDER — LIDOCAINE HCL (PF) 1 % IJ SOLN: 5.0000 mL | INTRAMUSCULAR | Status: DC | PRN

## 2014-08-14 MED ORDER — PENTAFLUOROPROP-TETRAFLUOROETH EX AERO
1.0000 "application " | INHALATION_SPRAY | CUTANEOUS | Status: DC | PRN
  Filled 2014-08-14: qty 30

## 2014-08-14 NOTE — ED Notes (Signed)
Hemodialysis contacted for estimated time of arrival

## 2014-08-14 NOTE — ED Notes (Signed)
States needs dialysis -- states does not go to a center anymore, just comes here-- [name]-- was admitted in  March for pneumonia.

## 2014-08-14 NOTE — Progress Notes (Signed)
Pt d/c to home after HD tx. Pt. Alert, no c/o, vss upon discharge.

## 2014-08-14 NOTE — ED Provider Notes (Signed)
CSN: [id number]     Arrival date & time 08/14/14  0818 History   First MD Initiated Contact with Patient 08/14/14 [phone]     Chief Complaint  Patient presents with  . needs dialysis      (Consider location/radiation/quality/duration/timing/severity/associated sxs/prior Treatment) HPI Comments: The patient is a 34 year old female, history of end-stage renal disease, unable to go to an outpatient Center at this time, arrives here stating that she needs her routine dialysis, no complaints  The history is provided by the patient.    Past Medical History  Diagnosis Date  . Renal disorder    History reviewed. No pertinent past surgical history. No family history on file. History  Substance Use Topics  . Smoking status: Current Every Day Smoker -- 1.00 packs/day    Types: Cigarettes  . Smokeless tobacco: Not on file  . Alcohol Use: No   OB History    No data available     Review of Systems  All other systems reviewed and are negative.     Allergies  Ativan; Geodon; Keflex; and Other  Home Medications   Prior to Admission medications   Medication Sig Start Date End Date Taking? Authorizing Provider  acetaminophen (TYLENOL) 500 MG tablet Take 500 mg by mouth every 6 (six) hours as needed for mild pain.   Yes Historical Provider, MD  calcium acetate (PHOSLO) 667 MG capsule Take 667 mg by mouth 3 (three) times daily with meals.   Yes Historical Provider, MD  carvedilol (COREG) 6.25 MG tablet Take 6.25 mg by mouth 2 (two) times daily with a meal.   Yes Historical Provider, MD  colchicine 0.6 MG tablet Take 0.6 mg by mouth every other day.   Yes Historical Provider, MD  haloperidol (HALDOL) 5 MG tablet Take 5 mg by mouth 2 (two) times daily.   Yes Historical Provider, MD  hydrALAZINE (APRESOLINE) 25 MG tablet Take 25 mg by mouth 3 (three) times daily.   Yes Historical Provider, MD  ibuprofen (ADVIL,MOTRIN) 200 MG tablet Take 400 mg by mouth every 8 (eight) hours as needed for mild  pain.   Yes Historical Provider, MD  isosorbide mononitrate (IMDUR) 30 MG 24 hr tablet Take 30 mg by mouth daily. 05/25/14  Yes Historical Provider, MD   Pulse 88  Temp(Src) 97.6 F (36.4 C) (Oral)  Resp 18  Ht 5\' 7"  (1.702 m)  Wt 146 lb 9.6 oz (66.497 kg)  BMI 22.96 kg/m2  SpO2 100% Physical Exam  Constitutional: She appears well-developed and well-nourished. No distress.  HENT:  Head: Normocephalic and atraumatic.  Mouth/Throat: Oropharynx is clear and moist. No oropharyngeal exudate.  Eyes: Conjunctivae and EOM are normal. Pupils are equal, round, and reactive to light. Right eye exhibits no discharge. Left eye exhibits no discharge. No scleral icterus.  Neck: Normal range of motion. Neck supple. No JVD present. No thyromegaly present.  Cardiovascular: Normal rate, regular rhythm, normal heart sounds and intact distal pulses.  Exam reveals no gallop and no friction rub.   No murmur heard. Pulmonary/Chest: Effort normal and breath sounds normal. No respiratory distress. She has no wheezes. She has no rales.  Abdominal: Soft. Bowel sounds are normal. She exhibits no distension and no mass. There is no tenderness.  Musculoskeletal: Normal range of motion. She exhibits no edema or tenderness.  Lymphadenopathy:    She has no cervical adenopathy.  Neurological: She is alert. Coordination normal.  Skin: Skin is warm and dry. No rash noted. No erythema.  Psychiatric:  She has a normal mood and affect. Her behavior is normal.  Nursing note and vitals reviewed.   ED Course  Procedures (including critical care time) Labs Review Labs Reviewed - No data to display  Imaging Review No results found.   EKG Interpretation None      MDM   Final diagnoses:  ESRD (end stage renal disease)    Well appearing vital signs are normal, no focal complaints, no focal findings, discussed with Dr. Jonnie Finner of nephrology who will arrange for dialysis.    [name], MD 08/14/14 [phone]

## 2014-08-14 NOTE — ED Notes (Signed)
Spoke with dialysis. Pt ready to go up.

## 2014-08-14 NOTE — Procedures (Signed)
I was present at this dialysis session, have reviewed the session itself and made  appropriate changes  Kelly Splinter MD (pgr) [phone]    ([phone] 08/14/2014, 2:53 PM

## 2014-08-16 ENCOUNTER — Encounter (HOSPITAL_COMMUNITY): Payer: Self-pay | Admitting: Family Medicine

## 2014-08-16 ENCOUNTER — Non-Acute Institutional Stay (HOSPITAL_COMMUNITY)
Admission: EM | Admit: 2014-08-16 | Discharge: 2014-08-16 | Disposition: A | Discharge status: Home / Self Care | Payer: Medicare Other | Attending: Emergency Medicine | Admitting: Emergency Medicine

## 2014-08-16 DIAGNOSIS — D631 Anemia in chronic kidney disease: Secondary | ICD-10-CM | POA: Diagnosis not present

## 2014-08-16 DIAGNOSIS — Z79899 Other long term (current) drug therapy: Secondary | ICD-10-CM | POA: Insufficient documentation

## 2014-08-16 DIAGNOSIS — M545 Low back pain: Secondary | ICD-10-CM | POA: Insufficient documentation

## 2014-08-16 DIAGNOSIS — Z881 Allergy status to other antibiotic agents status: Secondary | ICD-10-CM | POA: Insufficient documentation

## 2014-08-16 DIAGNOSIS — Z888 Allergy status to other drugs, medicaments and biological substances status: Secondary | ICD-10-CM | POA: Diagnosis not present

## 2014-08-16 DIAGNOSIS — F319 Bipolar disorder, unspecified: Secondary | ICD-10-CM | POA: Insufficient documentation

## 2014-08-16 DIAGNOSIS — N186 End stage renal disease: Secondary | ICD-10-CM | POA: Diagnosis not present

## 2014-08-16 DIAGNOSIS — F209 Schizophrenia, unspecified: Secondary | ICD-10-CM | POA: Insufficient documentation

## 2014-08-16 DIAGNOSIS — Z992 Dependence on renal dialysis: Secondary | ICD-10-CM

## 2014-08-16 DIAGNOSIS — I120 Hypertensive chronic kidney disease with stage 5 chronic kidney disease or end stage renal disease: Secondary | ICD-10-CM | POA: Diagnosis not present

## 2014-08-16 DIAGNOSIS — F17210 Nicotine dependence, cigarettes, uncomplicated: Secondary | ICD-10-CM | POA: Diagnosis not present

## 2014-08-16 DIAGNOSIS — M3214 Glomerular disease in systemic lupus erythematosus: Secondary | ICD-10-CM | POA: Diagnosis not present

## 2014-08-16 LAB — CBC
HEMATOCRIT: 27.1 % — AB (ref 36.0–46.0)
Hemoglobin: 8.7 g/dL — ABNORMAL LOW (ref 12.0–15.0)
MCH: 26.6 pg (ref 26.0–34.0)
MCHC: 32.1 g/dL (ref 30.0–36.0)
MCV: 82.9 fL (ref 78.0–100.0)
Platelets: 213 10*3/uL (ref 150–400)
RBC: 3.27 MIL/uL — ABNORMAL LOW (ref 3.87–5.11)
RDW: 18.8 % — ABNORMAL HIGH (ref 11.5–15.5)
WBC: 3.4 10*3/uL — ABNORMAL LOW (ref 4.0–10.5)

## 2014-08-16 LAB — RENAL FUNCTION PANEL
ANION GAP: 15 (ref 5–15)
Albumin: 2.8 g/dL — ABNORMAL LOW (ref 3.5–5.0)
BUN: 49 mg/dL — ABNORMAL HIGH (ref 6–20)
CHLORIDE: 98 mmol/L — AB (ref 101–111)
CO2: 21 mmol/L — AB (ref 22–32)
Calcium: 9.3 mg/dL (ref 8.9–10.3)
Creatinine, Ser: 11.59 mg/dL — ABNORMAL HIGH (ref 0.44–1.00)
GFR calc Af Amer: 4 mL/min — ABNORMAL LOW (ref >60)
GFR calc non Af Amer: 4 mL/min — ABNORMAL LOW (ref >60)
GLUCOSE: 127 mg/dL — AB (ref 70–99)
Phosphorus: 11.3 mg/dL — ABNORMAL HIGH (ref 2.5–4.6)
Potassium: 4.6 mmol/L (ref 3.5–5.1)
SODIUM: 134 mmol/L — AB (ref 135–145)

## 2014-08-16 LAB — HEPATITIS B SURFACE ANTIGEN: Hepatitis B Surface Ag: NEGATIVE

## 2014-08-16 MED ORDER — ALTEPLASE 2 MG IJ SOLR: 2.0000 mg | Freq: Once | INTRAMUSCULAR | Status: DC | PRN

## 2014-08-16 MED ORDER — LIDOCAINE-PRILOCAINE 2.5-2.5 % EX CREA: 1.0000 "application " | TOPICAL_CREAM | CUTANEOUS | Status: DC | PRN

## 2014-08-16 MED ORDER — SODIUM CHLORIDE 0.9 % IV SOLN: 100.0000 mL | INTRAVENOUS | Status: DC | PRN

## 2014-08-16 MED ORDER — LIDOCAINE HCL (PF) 1 % IJ SOLN: 5.0000 mL | INTRAMUSCULAR | Status: DC | PRN

## 2014-08-16 MED ORDER — PENTAFLUOROPROP-TETRAFLUOROETH EX AERO: 1.0000 "application " | INHALATION_SPRAY | CUTANEOUS | Status: DC | PRN

## 2014-08-16 MED ORDER — HEPARIN SODIUM (PORCINE) 1000 UNIT/ML DIALYSIS: 1000.0000 [IU] | INTRAMUSCULAR | Status: DC | PRN

## 2014-08-16 MED ORDER — NEPRO/CARBSTEADY PO LIQD: 237.0000 mL | ORAL | Status: DC | PRN

## 2014-08-16 MED ORDER — HEPARIN SODIUM (PORCINE) 1000 UNIT/ML DIALYSIS: 20.0000 [IU]/kg | Freq: Once | INTRAMUSCULAR | Status: DC

## 2014-08-16 NOTE — ED Provider Notes (Signed)
TIME SEEN: 10:50 AM  CHIEF COMPLAINT: Here for dialysis  HPI: Pt is a 34 y.o. female with history of lupus nephritis with end-stage renal disease on dialysis Monday, Wednesday and Friday, schizophrenia, hypertension who presents to the emergency department needing dialysis. Last dialysis was 2 days ago on Monday. She does not have a dialysis center and comes to the hospital for equally for dialysis. She denies any chest pain, shortness of breath, cough, vomiting or diarrhea. Has a right AV fistula that is nontender to palpation.  ROS: See HPI Constitutional: no fever  Eyes: no drainage  ENT: no runny nose   Cardiovascular:  no chest pain  Resp: no SOB  GI: no vomiting GU: no dysuria Integumentary: no rash  Allergy: no hives  Musculoskeletal: no leg swelling  Neurological: no slurred speech ROS otherwise negative  PAST MEDICAL HISTORY/PAST SURGICAL HISTORY:  Past Medical History  Diagnosis Date  . Psychosis   . Hypertension   . Lupus   . Lupus nephritis   . Bipolar 1 disorder   . Schizophrenia   . Pregnancy   . ESRD (end stage renal disease)   . Anemia   . Low back pain     MEDICATIONS:  Prior to Admission medications   Medication Sig Start Date End Date Taking? Authorizing Provider  acetaminophen (TYLENOL) 500 MG tablet Take 1,000 mg by mouth every 6 (six) hours as needed for moderate pain.   Yes Historical Provider, MD  calcium acetate (PHOSLO) 667 MG capsule Take 667-2,001 mg by mouth 4 (four) times daily. Take 2001 mg with breakfast, lunch and dinner, and 667 mg with a snack.   Yes Historical Provider, MD  carvedilol (COREG) 6.25 MG tablet Take 1 tablet (6.25 mg total) by mouth 2 (two) times daily with a meal. 07/01/14  Yes Elberta Leatherwood, MD  colchicine 0.6 MG tablet Take 1 tablet (0.6 mg total) by mouth every other day. 05/25/14  Yes Elberta Leatherwood, MD  haloperidol (HALDOL) 5 MG tablet Take 1 tablet (5 mg total) by mouth 2 (two) times daily. Patient taking differently: Take  5-10 mg by mouth 2 (two) times daily. 5mg  in morning, 10mg  in evening 06/12/14  Yes Elberta Leatherwood, MD  hydrALAZINE (APRESOLINE) 25 MG tablet TAKE 1 TABLET (25 MG TOTAL) BY MOUTH 3 (THREE) TIMES DAILY. 06/19/14  Yes Leone Haven, MD  ibuprofen (ADVIL) 200 MG tablet Take 1 tablet (200 mg total) by mouth 3 (three) times daily. 05/25/14  Yes Elberta Leatherwood, MD  isosorbide mononitrate (IMDUR) 30 MG 24 hr tablet TAKE 1 TABLET (30 MG TOTAL) BY MOUTH DAILY. 06/19/14  Yes Leone Haven, MD  metoprolol tartrate (LOPRESSOR) 25 MG tablet Take 25 mg by mouth 2 (two) times daily. 05/25/14  Yes Historical Provider, MD  Pseudoeph-Doxylamine-DM-APAP (NYQUIL PO) Take 5 mLs by mouth daily as needed (sleep).   Yes Historical Provider, MD    ALLERGIES:  Allergies  Allergen Reactions  . Ativan [Lorazepam] Other (See Comments)    Dysarthria(patient has difficulty speaking and slurred speech); denies swelling, itching, pain, or numbness.  [name] [Ziprasidone Hcl] Itching and Swelling    Tongue swelling  . Keflex [Cephalexin] Swelling    Tongue swelling  . Other Itching and Other (See Comments)    Wool causes itching    SOCIAL HISTORY:  History  Substance Use Topics  . Smoking status: Current Every Day Smoker -- 1.00 packs/day for 15 years    Types: Cigarettes  . Smokeless  tobacco: Current User  . Alcohol Use: 4.2 oz/week    4 Cans of beer, 3 Shots of liquor per week     Comment: WEEKENDS- 02/21/14-denies that she has not used any etoh or drugs in over a year.    FAMILY HISTORY: Family History  Problem Relation Age of Onset  . Drug abuse Father   . Kidney disease Father     EXAM: BP 145/87 mmHg  Pulse 87  Temp(Src) 98.2 F (36.8 C) (Oral)  Resp 18  Wt 143 lb 7 oz (65.063 kg)  SpO2 100%  LMP 08/11/2011 CONSTITUTIONAL: Alert and oriented and responds appropriately to questions. Well-appearing; well-nourished, smiling, pleasant, no distress HEAD: Normocephalic EYES: Conjunctivae clear,  PERRL ENT: normal nose; no rhinorrhea; moist mucous membranes; pharynx without lesions noted NECK: Supple, no meningismus, no LAD  CARD: RRR; S1 and S2 appreciated; no murmurs, no clicks, no rubs, no gallops RESP: Normal chest excursion without splinting or tachypnea; breath sounds clear and equal bilaterally; no wheezes, no rhonchi, no rales, no hypoxia or respiratory distress, speaking full sentences ABD/GI: Normal bowel sounds; non-distended; soft, non-tender, no rebound, no guarding, no peritoneal signs BACK:  The back appears normal and is non-tender to palpation, there is no CVA tenderness EXT: Normal ROM in all joints; non-tender to palpation; no edema; normal capillary refill; no cyanosis, no calf tenderness or swelling; right AV fistula with good thrill and bruit, 2+ radial pulses bilaterally, no peripheral edema   SKIN: Normal color for age and race; warm NEURO: Moves all extremities equally, sensation to light touch intact diffusely, cranial nerves II through XII intact PSYCH: The patient's mood and manner are appropriate. Grooming and personal hygiene are appropriate.  MEDICAL DECISION MAKING: Patient here for routine dialysis. She has no complaints. She is not significantly hypertensive. Dialysis is ready to take patient. She has orders placed by nephrology. Will discharge from the ED to dialysis. I do not feel she needs further emergent workup in the ED today.       [place], DO 08/16/14 1059

## 2014-08-16 NOTE — Progress Notes (Signed)
Pt completed 3 hours of dialysis without adverse events.  Pt stated she could only dialyze 3 hours related to a scheduled appointment after HD.  UF goal met without difficulty.  VSS and pt without complaint.  Pt being discharged to home with self care.

## 2014-08-16 NOTE — ED Notes (Signed)
Pt here for dialysis treatment not other concerns or complaints.

## 2014-08-16 NOTE — Discharge Instructions (Signed)
Dialysis °Dialysis is a procedure that replaces some of the work healthy kidneys do. It is done when you lose about 85-90% of your kidney function. It may also be done earlier if your symptoms may be improved by dialysis. During dialysis, wastes, salt, and extra water are removed from the blood, and the levels of certain chemicals in the blood (such as potassium) are maintained. Dialysis is done in sessions. Dialysis sessions are continued until the kidneys get better. If the kidneys cannot get better, such as in end-stage kidney disease, dialysis is continued for life or until you receive a new kidney (kidney transplant). There are two types of dialysis: hemodialysis and peritoneal dialysis. °WHAT IS HEMODIALYSIS?  °Hemodialysis is a type of dialysis in which a machine called a dialyzer is used to filter the blood. Before beginning hemodialysis, you will have surgery to create a site where blood can be removed from the body and returned to the body (vascular access). There are three types of vascular accesses: °· Arteriovenous fistula. To create this type of access, an artery is connected to a vein (usually in the arm). A fistula takes 1-6 months to develop after surgery. If it develops properly, it usually lasts longer than the other types of vascular accesses. It is also less likely to become infected and cause blood clots. °· Arteriovenous graft. To create this type of access, an artery and a vein in the arm are connected with a tube. A graft may be used within 2-3 weeks of surgery. °· A venous catheter. To create this type of access, a thin, flexible tube (catheter) is placed in a large vein in your neck, chest, or groin. A catheter may be used right away. It is usually used as a temporary access when dialysis needs to begin immediately. °During hemodialysis, blood leaves the body through your access. It travels through a tube to the dialyzer, where it is filtered. The blood then returns to your body through  another tube. °Hemodialysis is usually performed by a health care provider at a hospital or dialysis center three times a week. Visits last about 3-4 hours. It may also be performed with the help of another person at home with training.  °WHAT IS PERITONEAL DIALYSIS? °Peritoneal dialysis is a type of dialysis in which the thin lining of the abdomen (peritoneum) is used as a filter. Before beginning peritoneal dialysis, you will have surgery to place a catheter in your abdomen. The catheter will be used to transfer a fluid called dialysate to and from your abdomen. At the start of a session, your abdomen is filled with dialysate. During the session, wastes, salt, and extra water in the blood pass through the peritoneum and into the dialysate. The dialysate is drained from the body at the end of the session. The process of filling and draining the dialysate is called an exchange. Exchanges are repeated until you have used up all the dialysate for the day. °Peritoneal dialysis may be performed by you at home or at almost any other location. It is done every day. You may need up to five exchanges a day. The amount of time the dialysate is in your body between exchanges is called a dwell. The dwell depends on the number of exchanges needed and the characteristics of the peritoneum. It usually varies from 1.5-3 hours. You may go about your day normally between exchanges. Alternately, the exchanges may be done at night while you sleep, using a machine called a cycler. °WHICH TYPE   OF DIALYSIS SHOULD I CHOOSE?  Both hemodialysis and peritoneal dialysis have advantages and disadvantages. Talk to your health care provider about which type of dialysis would be best for you. Your lifestyle and preferences should be considered along with your medical condition. In some cases, only one type of dialysis may be an option.  Advantages of hemodialysis  It is done less often than peritoneal dialysis.  Someone else can do the  dialysis for you.  If you go to a dialysis center, your health care provider will be able to recognize any problems right away.  If you go to a dialysis center, you can interact with others who are having dialysis. This can provide you with emotional support. Disadvantages of hemodialysis  Hemodialysis may cause cramps and low blood pressure. It may leave you feeling tired on the days you have the treatment.  If you go to a dialysis center, you will need to make weekly appointments and work around the center's schedule.  You will need to take extra care when traveling. If you go to a dialysis center, you will need to make special arrangements to visit a dialysis center near your destination. If you are having treatments at home, you will need to take the dialyzer with you to your destination.  You will need to avoid more foods than you would need to avoid on peritoneal dialysis. Advantages of peritoneal dialysis  It is less likely than hemodialysis to cause cramps and low blood pressure.  You may do exchanges on your own wherever you are, including when you travel.  You do not need to avoid as many foods as you do on hemodialysis. Disadvantages of peritoneal dialysis  It is done more often than hemodialysis.  Performing peritoneal dialysis requires you to have dexterity of the hands. You must also be able to lift bags.  You will have to learn sterilization techniques. You will need to practice them every day to reduce the risk of infection. WHAT CHANGES WILL I NEED TO MAKE TO MY DIET DURING DIALYSIS? Both hemodialysis and peritoneal dialysis require you to make some changes to your diet. For example, you will need to limit your intake of foods high in the minerals phosphorus and potassium. You will also need to limit your fluid intake. Your dietitian can help you plan meals. A good meal plan can improve your dialysis and your health.  WHAT SHOULD I EXPECT WHEN BEGINNING  DIALYSIS? Adjusting to the dialysis treatment, schedule, and diet can take some time. You may need to stop working and may not be able to do some of the things you normally do. You may feel anxious or depressed when beginning dialysis. Eventually, many people feel better overall because of dialysis. Some people are able to return to work after making some changes, such as reducing work intensity. WHERE CAN I FIND MORE INFORMATION?   [place]: www.kidney.org  American Association of Kidney Patients: BombTimer.gl  [employer]: www.kidneyfund.org Document Released: 06/14/2002 Document Revised: 08/08/2013 Document Reviewed: 05/18/2012 [facility] Patient Information 2015 [place], [place]. This information is not intended to replace advice given to you by your health care provider. Make sure you discuss any questions you have with your health care provider.  Dialysis Diet A dialysis diet is a special diet when you start peritoneal dialysis or hemodialysis. Foods must be chosen carefully because different foods produce different wastes in your blood. If you are on dialysis treatment, that means your kidneys have stopped working properly on  their own. This means the kidneys are removing very little or no wastes from your blood. Dialysis can perform the function of a healthy kidney and filter wastes from your body. However, between dialysis sessions, wastes build up in your blood and can make you sick. You can reduce the amount of wastes that build up in your blood by watching what you eat and drink. A good meal plan can improve your dialysis and your health. Your dietitian or renal dietitian can help you plan meals.  FLUIDS In between dialysis sessions, your kidneys may be able to remove some fluid or none at all. Fluid can build up and cause swelling and weight gain. The extra fluid affects your blood pressure and can make your heart work harder. Overloading your body with fluid could  lead to serious heart trouble. Every person on dialysis has a different amount of fluid that they can have each day. Talk to your dietitian about how much fluid you can have each day and write that down.  Foods that add to your fluid intake include:  Foods that contain liquid at room temperature, such as soup, gelatin dessert, and ice cream.  Fruits and vegetables, such as melons, grapes, apples, oranges, tomatoes, lettuce, and celery. Your dietitian will be able to give you other tips for managing your thirst. POTASSIUM Potassium is a mineral found in many foods, especially milk, fruits, and vegetables. It affects how steadily your heart beats. Potassium levels can rise between dialysis sessions and can affect your heartbeat and may even cause death.  Avoid foods high in potassium. If you do eat high-potassium foods, eat smaller portions. For example, eat half a pear instead of a whole pear. Eat only very small portions of oranges and melons. You can remove some of the potassium from potatoes and other vegetables by peeling them and then soaking them in a large amount of water for several hours. Drain and rinse them before cooking. Talk to a dietitian about foods you can eat instead of high-potassium foods. High-potassium foods and drinks include:  Apricots.  Brussels sprouts.  Dates.  Lima beans.  Oranges.  Prune juice.  Spinach.  Avocados.  Milk.  Figs.  Melons.  Peanuts.  Prunes.  Tomatoes.  Bananas.  Cantaloupe.  Kiwi fruit.  Nectarines.  Asparagus spears.  Raisins.  Winter squash.  Beets.  Clams.  Orange juice.  Potatoes.  Sardines.  Yogurt. PHOSPHORUS Phosphorus is a mineral found in many foods. If you have too much phosphorus in your blood, your bones lose calcium. Losing calcium will make your bones weak and more likely to break. Also, too much phosphorus may make your skin itch. Food high in phosphorus include milk, cheese, dried beans,  peas, colas, nuts, and peanut butter. Avoid eating too much of these foods. Usually, people on dialysis are limited to  cup of milk per day. Talk to a dietitian about foods you can eat instead of high-phosphorus foods. PROTEIN You may be encouraged to eat as much "high-quality protein" as you can. High-quality proteins come from meat, fish, poultry, and eggs. Choose low-fat (lean) meats that are also low in phosphorus. If you are a vegetarian, ask your dietitian about other ways to get your protein. Low-fat milk is a good source of protein, but milk is high in phosphorus and potassium and adds to your fluid intake. Talk to a dietitian to see if milk fits into your food plan. SODIUM Sodium makes you thirsty, and if you are on dialysis,  you must restrict how much fluid you drink. Therefore, try to eat fresh foods that are naturally low in sodium. Stay away from canned foods and frozen dinners. Look for products labeled "low sodium." Do not use salt substitutes because they contain potassium. Talk to your dietitian about foods and spice blends without sodium or potassium.  CALORIES Calories come from food and provide energy for your body. Your dietitian may recommend adding or cutting down on the calories you eat depending on if you need to lose or gain weight. Talk to a dietitian about foods you can eat to either gain or lose weight. VITAMINS AND MINERALS Your caregiver may prescribe a vitamin and mineral supplement. Vitamins and minerals may be missing from your diet because you have to avoid so many foods. Talk to your dietitian or kidney doctor before choosing a supplement. Many vitamin or mineral supplements sold on the store shelf may be harmful to you. Document Released: 12/20/2003 Document Revised: 09/23/2011 Document Reviewed: 06/03/2011 [facility] Patient Information 2015 [place], [place]. This information is not intended to replace advice given to you by your health care provider. Make sure you  discuss any questions you have with your health care provider.

## 2014-08-18 ENCOUNTER — Encounter (HOSPITAL_COMMUNITY): Payer: Self-pay | Admitting: Physical Medicine and Rehabilitation

## 2014-08-18 ENCOUNTER — Emergency Department (HOSPITAL_COMMUNITY)
Admission: EM | Admit: 2014-08-18 | Discharge: 2014-08-18 | Disposition: A | Discharge status: Home / Self Care | Payer: Medicare Other | Attending: Emergency Medicine | Admitting: Emergency Medicine

## 2014-08-18 DIAGNOSIS — Y841 Kidney dialysis as the cause of abnormal reaction of the patient, or of later complication, without mention of misadventure at the time of the procedure: Secondary | ICD-10-CM | POA: Insufficient documentation

## 2014-08-18 DIAGNOSIS — Z992 Dependence on renal dialysis: Secondary | ICD-10-CM | POA: Diagnosis not present

## 2014-08-18 DIAGNOSIS — Z720 Tobacco use: Secondary | ICD-10-CM | POA: Diagnosis not present

## 2014-08-18 DIAGNOSIS — T82898A Other specified complication of vascular prosthetic devices, implants and grafts, initial encounter: Secondary | ICD-10-CM | POA: Diagnosis present

## 2014-08-18 DIAGNOSIS — N186 End stage renal disease: Secondary | ICD-10-CM | POA: Diagnosis not present

## 2014-08-18 DIAGNOSIS — Z4931 Encounter for adequacy testing for hemodialysis: Secondary | ICD-10-CM | POA: Insufficient documentation

## 2014-08-18 DIAGNOSIS — F29 Unspecified psychosis not due to a substance or known physiological condition: Secondary | ICD-10-CM | POA: Insufficient documentation

## 2014-08-18 DIAGNOSIS — Z862 Personal history of diseases of the blood and blood-forming organs and certain disorders involving the immune mechanism: Secondary | ICD-10-CM | POA: Insufficient documentation

## 2014-08-18 DIAGNOSIS — Z8739 Personal history of other diseases of the musculoskeletal system and connective tissue: Secondary | ICD-10-CM | POA: Insufficient documentation

## 2014-08-18 DIAGNOSIS — Z79899 Other long term (current) drug therapy: Secondary | ICD-10-CM | POA: Diagnosis not present

## 2014-08-18 DIAGNOSIS — I120 Hypertensive chronic kidney disease with stage 5 chronic kidney disease or end stage renal disease: Secondary | ICD-10-CM | POA: Diagnosis not present

## 2014-08-18 LAB — CBC
HCT: 26.6 % — ABNORMAL LOW (ref 36.0–46.0)
Hemoglobin: 8.5 g/dL — ABNORMAL LOW (ref 12.0–15.0)
MCH: 26.3 pg (ref 26.0–34.0)
MCHC: 32 g/dL (ref 30.0–36.0)
MCV: 82.4 fL (ref 78.0–100.0)
PLATELETS: 229 10*3/uL (ref 150–400)
RBC: 3.23 MIL/uL — AB (ref 3.87–5.11)
RDW: 19 % — AB (ref 11.5–15.5)
WBC: 3.9 10*3/uL — ABNORMAL LOW (ref 4.0–10.5)

## 2014-08-18 LAB — RENAL FUNCTION PANEL
Albumin: 2.9 g/dL — ABNORMAL LOW (ref 3.5–5.0)
Anion gap: 16 — ABNORMAL HIGH (ref 5–15)
BUN: 51 mg/dL — AB (ref 6–20)
CO2: 22 mmol/L (ref 22–32)
Calcium: 9.1 mg/dL (ref 8.9–10.3)
Chloride: 98 mmol/L — ABNORMAL LOW (ref 101–111)
Creatinine, Ser: 12.01 mg/dL — ABNORMAL HIGH (ref 0.44–1.00)
GFR calc Af Amer: 4 mL/min — ABNORMAL LOW (ref >60)
GFR calc non Af Amer: 4 mL/min — ABNORMAL LOW (ref >60)
GLUCOSE: 77 mg/dL (ref 65–99)
PHOSPHORUS: 11.7 mg/dL — AB (ref 2.5–4.6)
POTASSIUM: 4.9 mmol/L (ref 3.5–5.1)
Sodium: 136 mmol/L (ref 135–145)

## 2014-08-18 MED ORDER — DARBEPOETIN ALFA 200 MCG/0.4ML IJ SOSY
200.0000 ug | PREFILLED_SYRINGE | Freq: Once | INTRAMUSCULAR | Status: AC
  Administered 2014-08-18: 200 ug via INTRAVENOUS
  Filled 2014-08-18: qty 0.4

## 2014-08-18 MED ORDER — DARBEPOETIN ALFA 200 MCG/0.4ML IJ SOSY
PREFILLED_SYRINGE | INTRAMUSCULAR | Status: AC
  Filled 2014-08-18: qty 0.4

## 2014-08-18 MED ORDER — SODIUM CHLORIDE 0.9 % IV SOLN
62.5000 mg | Freq: Once | INTRAVENOUS | Status: AC
  Administered 2014-08-18: 62.5 mg via INTRAVENOUS
  Filled 2014-08-18: qty 5
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
## 2014-08-18 NOTE — ED Notes (Signed)
Pt to ED for hemodialysis treatment. No complaints at the time. Fistula to R arm, able to feel thrill, pt reports no issues with vascular access. Pt is alert and oriented x4. Denies pain. Vital signs stable.

## 2014-08-18 NOTE — Progress Notes (Signed)
Hemodialysis- Tolerated well without issue. Signed off with 58 minutes remaining. "Im ready to go". MD notified. AMA form signed. Vitals stable. Pt discharged to home/self care

## 2014-08-18 NOTE — ED Notes (Signed)
Pt presents to department for evaluation of hemodialysis treatment. No complaints at present.

## 2014-08-18 NOTE — ED Notes (Signed)
Report given to HD nurse. Pt transported to dialysis.

## 2014-08-21 ENCOUNTER — Observation Stay (HOSPITAL_COMMUNITY)
Admission: EM | Admit: 2014-08-21 | Discharge: 2014-08-22 | Disposition: A | Discharge status: Home / Self Care | Payer: Medicare Other | Attending: Family Medicine | Admitting: Family Medicine

## 2014-08-21 ENCOUNTER — Non-Acute Institutional Stay (HOSPITAL_COMMUNITY): Payer: Medicare Other

## 2014-08-21 ENCOUNTER — Encounter (HOSPITAL_COMMUNITY): Payer: Self-pay | Admitting: *Deleted

## 2014-08-21 DIAGNOSIS — Z720 Tobacco use: Secondary | ICD-10-CM | POA: Insufficient documentation

## 2014-08-21 DIAGNOSIS — Z79899 Other long term (current) drug therapy: Secondary | ICD-10-CM | POA: Insufficient documentation

## 2014-08-21 DIAGNOSIS — N2889 Other specified disorders of kidney and ureter: Secondary | ICD-10-CM

## 2014-08-21 DIAGNOSIS — M3214 Glomerular disease in systemic lupus erythematosus: Secondary | ICD-10-CM | POA: Insufficient documentation

## 2014-08-21 DIAGNOSIS — F29 Unspecified psychosis not due to a substance or known physiological condition: Secondary | ICD-10-CM | POA: Insufficient documentation

## 2014-08-21 DIAGNOSIS — D649 Anemia, unspecified: Secondary | ICD-10-CM | POA: Diagnosis not present

## 2014-08-21 DIAGNOSIS — R079 Chest pain, unspecified: Principal | ICD-10-CM | POA: Diagnosis present

## 2014-08-21 DIAGNOSIS — F209 Schizophrenia, unspecified: Secondary | ICD-10-CM | POA: Diagnosis not present

## 2014-08-21 DIAGNOSIS — I120 Hypertensive chronic kidney disease with stage 5 chronic kidney disease or end stage renal disease: Secondary | ICD-10-CM | POA: Diagnosis not present

## 2014-08-21 DIAGNOSIS — N186 End stage renal disease: Secondary | ICD-10-CM | POA: Diagnosis not present

## 2014-08-21 DIAGNOSIS — J189 Pneumonia, unspecified organism: Secondary | ICD-10-CM | POA: Diagnosis not present

## 2014-08-21 DIAGNOSIS — I151 Hypertension secondary to other renal disorders: Secondary | ICD-10-CM | POA: Diagnosis not present

## 2014-08-21 DIAGNOSIS — J159 Unspecified bacterial pneumonia: Secondary | ICD-10-CM | POA: Diagnosis not present

## 2014-08-21 DIAGNOSIS — F319 Bipolar disorder, unspecified: Secondary | ICD-10-CM | POA: Insufficient documentation

## 2014-08-21 DIAGNOSIS — I309 Acute pericarditis, unspecified: Secondary | ICD-10-CM | POA: Diagnosis not present

## 2014-08-21 DIAGNOSIS — Z992 Dependence on renal dialysis: Secondary | ICD-10-CM

## 2014-08-21 LAB — CBC WITH DIFFERENTIAL/PLATELET
BASOS ABS: 0 10*3/uL (ref 0.0–0.1)
BASOS PCT: 0 % (ref 0–1)
EOS PCT: 4 % (ref 0–5)
Eosinophils Absolute: 0.2 10*3/uL (ref 0.0–0.7)
HCT: 26.9 % — ABNORMAL LOW (ref 36.0–46.0)
HEMOGLOBIN: 8.6 g/dL — AB (ref 12.0–15.0)
Lymphocytes Relative: 23 % (ref 12–46)
Lymphs Abs: 1.2 10*3/uL (ref 0.7–4.0)
MCH: 26.7 pg (ref 26.0–34.0)
MCHC: 32 g/dL (ref 30.0–36.0)
MCV: 83.5 fL (ref 78.0–100.0)
Monocytes Absolute: 0.5 10*3/uL (ref 0.1–1.0)
Monocytes Relative: 10 % (ref 3–12)
NEUTROS ABS: 3.3 10*3/uL (ref 1.7–7.7)
Neutrophils Relative %: 63 % (ref 43–77)
Platelets: 178 10*3/uL (ref 150–400)
RBC: 3.22 MIL/uL — ABNORMAL LOW (ref 3.87–5.11)
RDW: 19.2 % — AB (ref 11.5–15.5)
WBC: 5.3 10*3/uL (ref 4.0–10.5)

## 2014-08-21 LAB — I-STAT CG4 LACTIC ACID, ED: Lactic Acid, Venous: 1.45 mmol/L (ref 0.5–2.0)

## 2014-08-21 LAB — BASIC METABOLIC PANEL
Anion gap: 17 — ABNORMAL HIGH (ref 5–15)
BUN: 56 mg/dL — ABNORMAL HIGH (ref 6–20)
CO2: 22 mmol/L (ref 22–32)
Calcium: 9.2 mg/dL (ref 8.9–10.3)
Chloride: 98 mmol/L — ABNORMAL LOW (ref 101–111)
Creatinine, Ser: 13.59 mg/dL — ABNORMAL HIGH (ref 0.44–1.00)
GFR calc non Af Amer: 3 mL/min — ABNORMAL LOW (ref >60)
GFR, EST AFRICAN AMERICAN: 4 mL/min — AB (ref >60)
Glucose, Bld: 68 mg/dL (ref 65–99)
POTASSIUM: 4.8 mmol/L (ref 3.5–5.1)
Sodium: 137 mmol/L (ref 135–145)

## 2014-08-21 LAB — TROPONIN I: Troponin I: <0.03 ng/mL (ref <0.031)

## 2014-08-21 LAB — I-STAT TROPONIN, ED: TROPONIN I, POC: 0.02 ng/mL (ref 0.00–0.08)

## 2014-08-21 MED ORDER — ALTEPLASE 2 MG IJ SOLR
2.0000 mg | Freq: Once | INTRAMUSCULAR | Status: DC | PRN
  Filled 2014-08-21: qty 2

## 2014-08-21 MED ORDER — COLCHICINE 0.6 MG PO TABS
0.6000 mg | ORAL_TABLET | ORAL | Status: DC
  Administered 2014-08-22: 0.6 mg via ORAL
  Filled 2014-08-21: qty 1

## 2014-08-21 MED ORDER — HEPARIN SODIUM (PORCINE) 1000 UNIT/ML DIALYSIS: 20.0000 [IU]/kg | Freq: Once | INTRAMUSCULAR | Status: DC

## 2014-08-21 MED ORDER — LIDOCAINE HCL (PF) 1 % IJ SOLN: 5.0000 mL | INTRAMUSCULAR | Status: DC | PRN

## 2014-08-21 MED ORDER — PENTAFLUOROPROP-TETRAFLUOROETH EX AERO: 1.0000 "application " | INHALATION_SPRAY | CUTANEOUS | Status: DC | PRN

## 2014-08-21 MED ORDER — LIDOCAINE-PRILOCAINE 2.5-2.5 % EX CREA
1.0000 "application " | TOPICAL_CREAM | CUTANEOUS | Status: DC | PRN
  Filled 2014-08-21: qty 5

## 2014-08-21 MED ORDER — HALOPERIDOL 5 MG PO TABS
5.0000 mg | ORAL_TABLET | Freq: Every day | ORAL | Status: DC
  Administered 2014-08-22: 5 mg via ORAL
  Filled 2014-08-21: qty 1

## 2014-08-21 MED ORDER — CALCIUM ACETATE 667 MG PO CAPS
667.0000 mg | ORAL_CAPSULE | ORAL | Status: DC | PRN
  Filled 2014-08-21: qty 1

## 2014-08-21 MED ORDER — HEPARIN SODIUM (PORCINE) 1000 UNIT/ML DIALYSIS: 1000.0000 [IU] | INTRAMUSCULAR | Status: DC | PRN

## 2014-08-21 MED ORDER — HEPARIN SODIUM (PORCINE) 5000 UNIT/ML IJ SOLN
5000.0000 [IU] | Freq: Three times a day (TID) | INTRAMUSCULAR | Status: DC
  Filled 2014-08-21 (×2): qty 1

## 2014-08-21 MED ORDER — SODIUM CHLORIDE 0.9 % IJ SOLN
3.0000 mL | Freq: Two times a day (BID) | INTRAMUSCULAR | Status: DC
  Administered 2014-08-21: 3 mL via INTRAVENOUS

## 2014-08-21 MED ORDER — FENTANYL CITRATE (PF) 100 MCG/2ML IJ SOLN: 25.0000 ug | Freq: Once | INTRAMUSCULAR | Status: DC

## 2014-08-21 MED ORDER — FENTANYL CITRATE (PF) 100 MCG/2ML IJ SOLN
50.0000 ug | Freq: Once | INTRAMUSCULAR | Status: AC
  Administered 2014-08-21: 50 ug via NASAL
  Filled 2014-08-21: qty 2

## 2014-08-21 MED ORDER — SODIUM CHLORIDE 0.9 % IV SOLN: 100.0000 mL | INTRAVENOUS | Status: DC | PRN

## 2014-08-21 MED ORDER — HEPARIN SODIUM (PORCINE) 1000 UNIT/ML DIALYSIS
6000.0000 [IU] | Freq: Once | INTRAMUSCULAR | Status: DC
  Filled 2014-08-21: qty 6

## 2014-08-21 MED ORDER — ISOSORBIDE MONONITRATE ER 30 MG PO TB24
30.0000 mg | ORAL_TABLET | Freq: Every day | ORAL | Status: DC
  Administered 2014-08-21 – 2014-08-22 (×2): 30 mg via ORAL
  Filled 2014-08-21 (×2): qty 1

## 2014-08-21 MED ORDER — CARVEDILOL 6.25 MG PO TABS
6.2500 mg | ORAL_TABLET | Freq: Two times a day (BID) | ORAL | Status: DC
  Administered 2014-08-21 – 2014-08-22 (×3): 6.25 mg via ORAL
  Filled 2014-08-21 (×3): qty 1

## 2014-08-21 MED ORDER — HYDRALAZINE HCL 25 MG PO TABS
25.0000 mg | ORAL_TABLET | Freq: Three times a day (TID) | ORAL | Status: DC
  Administered 2014-08-21 – 2014-08-22 (×4): 25 mg via ORAL
  Filled 2014-08-21 (×4): qty 1

## 2014-08-21 MED ORDER — SODIUM CHLORIDE 0.9 % IV SOLN: 250.0000 mL | INTRAVENOUS | Status: DC | PRN

## 2014-08-21 MED ORDER — NEPRO/CARBSTEADY PO LIQD
237.0000 mL | ORAL | Status: DC | PRN
  Filled 2014-08-21: qty 237

## 2014-08-21 MED ORDER — NEPRO/CARBSTEADY PO LIQD: 237.0000 mL | ORAL | Status: DC | PRN

## 2014-08-21 MED ORDER — VANCOMYCIN HCL 500 MG IV SOLR: 500.0000 mg | INTRAVENOUS | Status: DC

## 2014-08-21 MED ORDER — LIDOCAINE-PRILOCAINE 2.5-2.5 % EX CREA: 1.0000 "application " | TOPICAL_CREAM | CUTANEOUS | Status: DC | PRN

## 2014-08-21 MED ORDER — CALCIUM ACETATE 667 MG PO CAPS
2001.0000 mg | ORAL_CAPSULE | Freq: Three times a day (TID) | ORAL | Status: DC
  Administered 2014-08-21 – 2014-08-22 (×4): 2001 mg via ORAL
  Filled 2014-08-21 (×5): qty 3

## 2014-08-21 MED ORDER — ASPIRIN 325 MG PO TABS
325.0000 mg | ORAL_TABLET | Freq: Once | ORAL | Status: AC
  Administered 2014-08-21: 325 mg via ORAL
  Filled 2014-08-21: qty 1

## 2014-08-21 MED ORDER — ACETAMINOPHEN 500 MG PO TABS
1000.0000 mg | ORAL_TABLET | Freq: Four times a day (QID) | ORAL | Status: DC | PRN
  Administered 2014-08-21 (×2): 1000 mg via ORAL
  Filled 2014-08-21 (×2): qty 2

## 2014-08-21 MED ORDER — SODIUM CHLORIDE 0.9 % IJ SOLN: 3.0000 mL | INTRAMUSCULAR | Status: DC | PRN

## 2014-08-21 MED ORDER — VANCOMYCIN HCL IN DEXTROSE 1-5 GM/200ML-% IV SOLN
1000.0000 mg | Freq: Once | INTRAVENOUS | Status: AC
  Administered 2014-08-21: 1000 mg via INTRAVENOUS
  Filled 2014-08-21: qty 200

## 2014-08-21 MED ORDER — PIPERACILLIN-TAZOBACTAM IN DEX 2-0.25 GM/50ML IV SOLN
2.2500 g | Freq: Three times a day (TID) | INTRAVENOUS | Status: DC
  Administered 2014-08-21 – 2014-08-22 (×3): 2.25 g via INTRAVENOUS
  Filled 2014-08-21 (×6): qty 50

## 2014-08-21 MED ORDER — HALOPERIDOL 5 MG PO TABS
10.0000 mg | ORAL_TABLET | Freq: Every day | ORAL | Status: DC
  Administered 2014-08-21: 10 mg via ORAL
  Filled 2014-08-21 (×2): qty 2
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
## 2014-08-21 NOTE — ED Notes (Signed)
Pt here for chest pain - states feels like sharp pull to center and L since yesterday.  Not accompanied by nv or diaphoresis.  Pt also here for dialysis M-W-F.

## 2014-08-21 NOTE — ED Notes (Signed)
Spoke with RN Verdene Lennert.  States they are ready for her. X [phone].

## 2014-08-21 NOTE — ED Notes (Signed)
Admitting MD at bedside.

## 2014-08-21 NOTE — Progress Notes (Signed)
Pt dialyzer clotted after 3 hours into the treatment.  Pt refused to have a new dialyzer and lines placed.  Shelle Iron, NP notified.  Dialysis treatment ended as pt requested.

## 2014-08-21 NOTE — Progress Notes (Signed)
ANTIBIOTIC CONSULT NOTE - INITIAL  Pharmacy Consult for Vancomycin and Zosyn Indication: rule out pneumonia  Allergies  Allergen Reactions  . Ativan [Lorazepam] Other (See Comments)    Dysarthria(patient has difficulty speaking and slurred speech); denies swelling, itching, pain, or numbness.  [name] [Ziprasidone Hcl] Itching and Swelling    Tongue swelling  . Keflex [Cephalexin] Swelling    Tongue swelling  . Other Itching and Other (See Comments)    Wool causes itching    Patient Measurements: Weight: 134 lb 11.2 oz (61.1 kg)  Vital Signs: Temp: 98.2 F (36.8 C) (05/16 1518) Temp Source: Oral (05/16 1518) BP: 133/84 mmHg (05/16 1518) Pulse Rate: 111 (05/16 1518) Intake/Output from previous day:   Intake/Output from this shift: Total I/O In: -  Out: 3381 [Other:3381]  Labs:  Recent Labs  08/21/14 0950  WBC 5.3  HGB 8.6*  PLT 178  CREATININE 13.59*   Estimated Creatinine Clearance: 5.7 mL/min (by C-G formula based on Cr of 13.59). No results for input(s): VANCOTROUGH, VANCOPEAK, VANCORANDOM, GENTTROUGH, GENTPEAK, GENTRANDOM, TOBRATROUGH, TOBRAPEAK, TOBRARND, AMIKACINPEAK, AMIKACINTROU, AMIKACIN in the last 72 hours.   Microbiology: Recent Results (from the past 720 hour(s))  Urine culture     Status: None   Collection Time: 08/02/14  8:30 AM  Result Value Ref Range Status   Specimen Description URINE, CLEAN CATCH  Final   Special Requests NONE  Final   Colony Count   Final    25,000 COLONIES/ML Performed at [facility]    Culture   Final    Multiple bacterial morphotypes present, none predominant. Suggest appropriate recollection if clinically indicated. Performed at [facility]    Report Status 08/03/2014 FINAL  Final    Medical History: Past Medical History  Diagnosis Date  . Psychosis   . Hypertension   . Lupus   . Lupus nephritis   . Bipolar 1 disorder   . Schizophrenia   . Pregnancy   . ESRD (end stage renal disease)    . Anemia   . Low back pain     Assessment: 34 year old female to begin Vancomycin and Zosyn for HCAP.  She has ESRD with HD [name] as an outpatient (she no longer has an outpatient HD center).   PMH: ESRD, HTN, schizophrenia, SLE  Goal of Therapy:  Appropriate dosing  Plan:  Zosyn 2.25 grams iv Q 8 hours (has had Zosyn in the past) Vancomycin 1000 mg iv x 1 dose now then 500 mg iv QHD ([name] schedule for now) Follow up fever trend, cultures, progress  Watch HD schedule  Thank you. Anette Guarneri, PharmD [phone]  08/21/2014,4:21 PM
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
## 2014-08-22 ENCOUNTER — Observation Stay (HOSPITAL_BASED_OUTPATIENT_CLINIC_OR_DEPARTMENT_OTHER): Payer: Medicare Other

## 2014-08-22 DIAGNOSIS — R079 Chest pain, unspecified: Secondary | ICD-10-CM | POA: Diagnosis not present

## 2014-08-22 DIAGNOSIS — J189 Pneumonia, unspecified organism: Secondary | ICD-10-CM | POA: Diagnosis not present

## 2014-08-22 DIAGNOSIS — N186 End stage renal disease: Secondary | ICD-10-CM | POA: Diagnosis not present

## 2014-08-22 DIAGNOSIS — I309 Acute pericarditis, unspecified: Secondary | ICD-10-CM | POA: Diagnosis not present

## 2014-08-22 DIAGNOSIS — I120 Hypertensive chronic kidney disease with stage 5 chronic kidney disease or end stage renal disease: Secondary | ICD-10-CM | POA: Diagnosis not present

## 2014-08-22 DIAGNOSIS — I151 Hypertension secondary to other renal disorders: Secondary | ICD-10-CM | POA: Diagnosis not present

## 2014-08-22 DIAGNOSIS — J159 Unspecified bacterial pneumonia: Secondary | ICD-10-CM | POA: Diagnosis not present

## 2014-08-22 LAB — BASIC METABOLIC PANEL
ANION GAP: 14 (ref 5–15)
BUN: 30 mg/dL — ABNORMAL HIGH (ref 6–20)
CALCIUM: 9.3 mg/dL (ref 8.9–10.3)
CHLORIDE: 98 mmol/L — AB (ref 101–111)
CO2: 24 mmol/L (ref 22–32)
CREATININE: 8.94 mg/dL — AB (ref 0.44–1.00)
GFR calc Af Amer: 6 mL/min — ABNORMAL LOW (ref >60)
GFR calc non Af Amer: 5 mL/min — ABNORMAL LOW (ref >60)
Glucose, Bld: 82 mg/dL (ref 65–99)
Potassium: 4.5 mmol/L (ref 3.5–5.1)
SODIUM: 136 mmol/L (ref 135–145)

## 2014-08-22 LAB — CBC
HCT: 26.5 % — ABNORMAL LOW (ref 36.0–46.0)
Hemoglobin: 8.3 g/dL — ABNORMAL LOW (ref 12.0–15.0)
MCH: 26.1 pg (ref 26.0–34.0)
MCHC: 31.3 g/dL (ref 30.0–36.0)
MCV: 83.3 fL (ref 78.0–100.0)
PLATELETS: 226 10*3/uL (ref 150–400)
RBC: 3.18 MIL/uL — AB (ref 3.87–5.11)
RDW: 18.7 % — ABNORMAL HIGH (ref 11.5–15.5)
WBC: 4.2 10*3/uL (ref 4.0–10.5)

## 2014-08-22 LAB — PHOSPHORUS: Phosphorus: 7.7 mg/dL — ABNORMAL HIGH (ref 2.5–4.6)

## 2014-08-22 LAB — STREP PNEUMONIAE URINARY ANTIGEN: Strep Pneumo Urinary Antigen: NEGATIVE

## 2014-08-22 LAB — HIV ANTIBODY (ROUTINE TESTING W REFLEX): HIV SCREEN 4TH GENERATION: NONREACTIVE

## 2014-08-22 LAB — MAGNESIUM: Magnesium: 2.4 mg/dL (ref 1.7–2.4)

## 2014-08-22 MED ORDER — LEVOFLOXACIN 500 MG PO TABS: 500.0000 mg | ORAL_TABLET | ORAL | Status: DC

## 2014-08-22 MED ORDER — LEVOFLOXACIN 750 MG PO TABS: 750.0000 mg | ORAL_TABLET | Freq: Once | ORAL | Status: DC

## 2014-08-22 NOTE — Progress Notes (Signed)
Family Medicine Teaching Service Daily Progress Note Intern Pager: [phone]  Patient name: Sara [name] Medical record number: [id number] Date of birth: 1980-10-27 Age: 34 y.o. Gender: female  Primary Care Provider: Tommi Rumps, MD Consultants: None Code Status: Full  Pt Overview and Major Events to Date:  5/17 - Admitted for chest pain rule out ACS, HCAP, and ESRD needing Dialysis.   Assessment and Plan: Sara [name] is a 34 y.o. female presenting for dialysis, also complaining of chest pain x 1 day. 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  # Chest pain R/O ACS: CP is pleuritic in description, with worsening pain when taking deep breaths, also with moving in certain positions. Non-exertional and nonreproducible on exam. Workup in ER significant for CXR with +RLL PNA (see discussion below). Doubt cardiac etiology of pain as pain is non exertional and has no associated nausea, along with normal cardiac w/u in ED (EKG unremarkable except mildly prolonged QTc, troponin neg). Pt does have risk factors for CAD including ESRD, smoking, HTN. PE unlikely as pt has no hypoxia or tachycardia. PERC negative in ER so d-dimer not obtained (would likely be false positive due to renal disease anyway). 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 Troponins Cycled and negative. Chest pain has resolved.  - admit to telemetry unit - Troponins negative. Will recycle is chest pain reoccurs.  - repeat EKG pending.  - workup/treat HCAP as below - continue colchicine - Echocardiogram with improved EF to 40-45% and some wall motion abnormalities. Persistent small pericardial effusion.   # HCAP: CXR with RLL infiltrate suggestive of PNA. Unclear if this truly represents PNA as patient is without associated leukocytosis, hypoxia, dyspnea, fever, or cough. On review of prior CXR's, has had RLL  infiltrate/effusion in the past multiple times. Last CT chest in March 2016 and showed edema. Respiratory status remains stable. She is not requiring O2, and is clinically responsive to treatment. No crackles to exam. Urinary strep and legionella negative. WBC unremarkable. - Will deescalate to oral antibiotics. Levaquin po.  - May consider repeat CXR vs. Chest CT post- HD to evaluate RLL effusion. Likely asymetric pulmonary congestion.  - Blood Cx negative. - sputum gram stain & culture negative.  - incentive spirometry  # Myopericarditis  - Of unkown etiology back in 05/2014. She has been evaluated by cardiology during her admission again in 06/2014. She initially had a reduced EF to 30-35% which has since improved. Dr. Jeffie Pollock evaluated her at her last admission, and did a bedside echocardiogram with similar EF and findings. At that time recommendation was to continue Coreg, Colchicine, Hydralazine, and Imdur and follow up with them in the outpatient setting.  - Myocardial injury with improvement in myocardial function.  - near dry weight.  - Continue Colchicine 0.6mg  qod given ESRD - Continue Coreg. (switched from Metoprolol at last admission) - no ARB / ACE given ESRd.  - Continue Imdur and Hydralazine.  - She did not follow up with Cards since her last discharge. Will attempt to set up another f/u Appt prior to d/c.  - Echo at this visit with improved EF of 40-45%, persistent small pericardial effusion and some more noticeable wall motion abnormality.   # ESRD:  Due to Lupus Nephritis. on dialysis [name] via ER (no longer has spot at outpatient dialysis center) - renal consulted, for HD. Next HD is on 5/18.  - Will get  HD prior to D/C - renally dose meds - Phos - 7.7, Mg - 2.4.  - Trend renal panel.  - on Phoslo  # AOCKD - Hgb 8.3 baseline.   # HTN: BP presently acceptable. Improves with forced compliance here.  - continue coreg, hydralazine, imdur - Ensure that patient is not taking  lopressor at d/c.   # Schizophrenia: stable on haldol - continue haldol - monitor QTc  - Repeat EKG pending.   FEN/GI: renal diet, SLIV Prophylaxis: SQ heparin  Disposition: Pending tolerance of oral antibiotics and repeat EKG. HD tomorrow.  Subjective:  No acute events overnight. Chest pain resolved. Pt. Upset because she missed a job interview. She otherwise has no complaints. No SOB. No N/ V.   Objective: Temp:  [98.2 F (36.8 C)-98.9 F (37.2 C)] 98.8 F (37.1 C) (05/17 0506) Pulse Rate:  [84-111] 89 (05/17 0506) Resp:  [18-26] 18 (05/17 0506) BP: (127-172)/(73-108) 139/84 mmHg (05/17 0506) SpO2:  [97 %-100 %] 98 % (05/17 0506) Weight:  [134 lb 11.2 oz (61.1 kg)-142 lb 10.2 oz (64.7 kg)] 134 lb 14.7 oz (61.2 kg) (05/17 0506) Physical Exam: General: NAD, AAOx3 Cardiovascular: RRR, No MGR, Normal S1/S2, 2+ distal pulses.  Respiratory: CTA Bilaterally, no crackles, no rales, no wheezes, appropriate rate, unlabored.  Abdomen: S, NT, Nd, +BS Extremities: WWp, 2+ distal pulses, no edema.   Laboratory:  Recent Labs Lab 08/18/14 1230 08/21/14 0950 08/22/14 0256  WBC 3.9* 5.3 4.2  HGB 8.5* 8.6* 8.3*  HCT 26.6* 26.9* 26.5*  PLT 229 178 226    Recent Labs Lab 08/18/14 1230 08/21/14 0950 08/22/14 0256  NA 136 137 136  K 4.9 4.8 4.5  CL 98* 98* 98*  CO2 22 22 24   BUN 51* 56* 30*  CREATININE 12.01* 13.59* 8.94*  CALCIUM 9.1 9.2 9.3  GLUCOSE 77 68 82   Phos - 7.7 Mg - 2.4   Imaging/Diagnostic Tests: Echo 5/17:  Impressions:  - Compared to the prior study in 05/2014, the EF has improved to 40-45%, there are more clear inferior and lateral wall motion abnormalities. The pericardial effusion is smaller, but still present. No clear signs of tamponade physiology.  CXR 5/16:  FINDINGS: Mediastinum and hilar structures are normal. Right lower lobe infiltrate consistent with pneumonia. Right pleural effusion. Stable left pleural thickening. Stable  cardiomegaly. No acute bony abnormality  IMPRESSION: 1. New onset right lower lobe infiltrate consistent with pneumonia. Associated right pleural effusion noted. 2. Stable cardiomegaly. Small left pleural effusion. A component congestive heart failure cannot be completely excluded.  Aquilla Hacker, MD 08/22/2014, 9:51 AM PGY-1, [place] Intern pager: [phone], text pages welcome

## 2014-08-22 NOTE — Progress Notes (Signed)
  Echocardiogram 2D Echocardiogram has been performed.  Sara [name] 08/22/2014, 8:51 AM

## 2014-08-22 NOTE — Care Management Note (Signed)
Case Management Note  Patient Details  Name: Sara [name] MRN: [id number] Date of Birth: [birth year]  Subjective/Objective:      Pt admitted for cp.               Action/Plan: CM received referral for medication assistance. Pt has Medicaid and Medicare. Pt will not need assisting with medications at this time.   Expected Discharge Date:  08/23/14               Expected Discharge Plan:  Home/Self Care  In-House Referral:     Discharge planning Services  CM Consult  Post Acute Care Choice:    Choice offered to:     DME Arranged:    DME Agency:     HH Arranged:    HH Agency:     Status of Service:     Medicare Important Message Given:  No Date Medicare IM Given:    Medicare IM give by:    Date Additional Medicare IM Given:    Additional Medicare Important Message give by:     If discussed at [place] of Stay Meetings, dates discussed:    Additional Comments:  Bethena Roys, RN 08/22/2014, 11:32 AM

## 2014-08-22 NOTE — Progress Notes (Signed)
Pt signed herself out of the hospital. She sated that he pneumonia is cleared up and she feels better and wants to go home. I insisted that she needs to be on antibiotics for a few days to treat her PNA but she stated: " I have to come back tomorrow anyway for dialysis anyway " . She signed the AMA form and left the hospital. Her mother was present the all time and she left with patient. IV access were discontinued and tele box removed. MD primary team was notified. Ferdinand Lango, RN.

## 2014-08-23 ENCOUNTER — Non-Acute Institutional Stay (HOSPITAL_COMMUNITY)
Admission: EM | Admit: 2014-08-23 | Discharge: 2014-08-23 | Disposition: A | Discharge status: Home / Self Care | Payer: Medicare Other | Attending: Emergency Medicine | Admitting: Emergency Medicine

## 2014-08-23 ENCOUNTER — Encounter (HOSPITAL_COMMUNITY): Payer: Self-pay | Admitting: Family Medicine

## 2014-08-23 DIAGNOSIS — Z992 Dependence on renal dialysis: Secondary | ICD-10-CM | POA: Diagnosis not present

## 2014-08-23 DIAGNOSIS — N186 End stage renal disease: Secondary | ICD-10-CM | POA: Insufficient documentation

## 2014-08-23 DIAGNOSIS — J189 Pneumonia, unspecified organism: Secondary | ICD-10-CM | POA: Insufficient documentation

## 2014-08-23 DIAGNOSIS — F17210 Nicotine dependence, cigarettes, uncomplicated: Secondary | ICD-10-CM | POA: Insufficient documentation

## 2014-08-23 DIAGNOSIS — I120 Hypertensive chronic kidney disease with stage 5 chronic kidney disease or end stage renal disease: Secondary | ICD-10-CM | POA: Diagnosis present

## 2014-08-23 DIAGNOSIS — M3214 Glomerular disease in systemic lupus erythematosus: Secondary | ICD-10-CM | POA: Insufficient documentation

## 2014-08-23 LAB — CBC WITH DIFFERENTIAL/PLATELET
BASOS ABS: 0 10*3/uL (ref 0.0–0.1)
BASOS PCT: 1 % (ref 0–1)
EOS ABS: 0.3 10*3/uL (ref 0.0–0.7)
Eosinophils Relative: 8 % — ABNORMAL HIGH (ref 0–5)
HCT: 29.7 % — ABNORMAL LOW (ref 36.0–46.0)
HEMOGLOBIN: 9.3 g/dL — AB (ref 12.0–15.0)
LYMPHS ABS: 0.8 10*3/uL (ref 0.7–4.0)
LYMPHS PCT: 21 % (ref 12–46)
MCH: 26.1 pg (ref 26.0–34.0)
MCHC: 31.3 g/dL (ref 30.0–36.0)
MCV: 83.4 fL (ref 78.0–100.0)
MONOS PCT: 10 % (ref 3–12)
Monocytes Absolute: 0.4 10*3/uL (ref 0.1–1.0)
NEUTROS PCT: 60 % (ref 43–77)
Neutro Abs: 2.2 10*3/uL (ref 1.7–7.7)
Platelets: 299 10*3/uL (ref 150–400)
RBC: 3.56 MIL/uL — AB (ref 3.87–5.11)
RDW: 19.3 % — ABNORMAL HIGH (ref 11.5–15.5)
WBC: 3.6 10*3/uL — AB (ref 4.0–10.5)

## 2014-08-23 LAB — LEGIONELLA ANTIGEN, URINE

## 2014-08-23 LAB — BASIC METABOLIC PANEL
ANION GAP: 14 (ref 5–15)
BUN: 42 mg/dL — ABNORMAL HIGH (ref 6–20)
CO2: 24 mmol/L (ref 22–32)
CREATININE: 12.03 mg/dL — AB (ref 0.44–1.00)
Calcium: 9.4 mg/dL (ref 8.9–10.3)
Chloride: 98 mmol/L — ABNORMAL LOW (ref 101–111)
GFR calc Af Amer: 4 mL/min — ABNORMAL LOW (ref >60)
GFR calc non Af Amer: 4 mL/min — ABNORMAL LOW (ref >60)
Glucose, Bld: 74 mg/dL (ref 65–99)
Potassium: 4.3 mmol/L (ref 3.5–5.1)
Sodium: 136 mmol/L (ref 135–145)

## 2014-08-23 NOTE — ED Notes (Signed)
Patient moved to pod C in no acute distress, eating meal.  Pt. Called dialysis on her own and states she wants to leave AMA because she doesn't want to wait an hour.

## 2014-08-23 NOTE — ED Notes (Signed)
Pt went to cafeteria prior to triage

## 2014-08-23 NOTE — ED Notes (Signed)
Dr. Tawnya Crook and This RN explained risks of leaving AMA pt made aware she can return at any time and sts will come tomorrow.

## 2014-08-23 NOTE — ED Notes (Signed)
Pt here for dialysis treatment. 
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
## 2014-08-23 NOTE — Discharge Instructions (Signed)
End-Stage Kidney Disease °The kidneys are two organs that lie on either side of the spine between the middle of the back and the front of the abdomen. The kidneys:  °· Remove wastes and extra water from the blood.   °· Produce important hormones. These help keep bones strong, regulate blood pressure, and help create red blood cells.   °· Balance the fluids and chemicals in the blood and tissues. °End-stage kidney disease occurs when the kidneys are so damaged that they cannot do their job. When the kidneys cannot do their job, life-threatening problems occur. The body cannot stay clean and strong without the help of the kidneys. In end-stage kidney disease, the kidneys cannot get better. You need a new kidney or treatments to do some of the work healthy kidneys do in order to stay alive. °CAUSES  °End-stage kidney disease usually occurs when a long-lasting (chronic) kidney disease gets worse. It may also occur after the kidneys are suddenly damaged (acute kidney injury).  °SYMPTOMS  °· Swelling (edema) of the legs, ankles, or feet.   °· Tiredness (lethargy).   °· Nausea or vomiting.   °· Confusion.   °· Problems with urination, such as:   °¨ Decreased urine production.   °¨ Frequent urination, especially at night.   °¨ Frequent accidents in children who are potty trained.   °· Muscle twitches and cramps.   °· Persistent itchiness.   °· Loss of appetite.   °· Headaches.   °· Abnormally dark or light skin.   °· Numbness in the hands or feet.   °· Easy bruising.   °· Frequent hiccups.   °· Menstruation stops. °DIAGNOSIS  °Your health care provider will measure your blood pressure and take some tests. These may include:  °· Urine tests.   °· Blood tests.   °· Imaging tests, such as:   °¨ An ultrasound exam.   °¨ Computed tomography (CT). °· A kidney biopsy. °TREATMENT  °There are two treatments for end-stage kidney disease:  °· A procedure that removes toxic wastes from the body (dialysis).   °· Receiving a new kidney  (kidney transplant). °Both of these treatments have serious risks and consequences. Your health care provider will help you determine which treatment is best for you based on your health, age, and other factors. In addition to having dialysis or a kidney transplant, you may need to take medicines to control high blood pressure (hypertension) and cholesterol and to decrease phosphorus levels in your blood.  °HOME CARE INSTRUCTIONS °· Follow your prescribed diet.   °· Take medicines only as directed by your health care provider.   °· Do not take any new medicines (prescription, over-the-counter, or nutritional supplements) unless approved by your health care provider. Many medicines can worsen your kidney damage or need to have the dose adjusted.   °· Keep all follow-up visits as directed by your health care provider. °MAKE SURE YOU: °· Understand these instructions. °· Will watch your condition. °· Will get help right away if you are not doing well or get worse. °Document Released: 06/14/2003 Document Revised: 08/08/2013 Document Reviewed: 11/21/2011 °ExitCare® Patient Information ©2015 [employer]. This information is not intended to replace advice given to you by your health care provider. Make sure you discuss any questions you have with your health care provider. ° °

## 2014-08-23 NOTE — Discharge Summary (Signed)
[facility] Discharge Summary  Patient name: Sara [name] Medical record number: [id number] Date of birth: 1980-09-03 Age: 34 y.o. Gender: female Date of Admission: 08/23/2014  Date of Discharge: 08/22/2014 Admitting Physician: No admitting provider for patient encounter.  Primary Care Provider: Tommi Rumps, MD Consultants: None.   Indication for Hospitalization:  Chest Pain Rule Out ACS HCAP  Discharge Diagnoses/Problem List:  Myopericarditis HTN ESRD Lupus Nephritis SLE Schizzoaffective Disorder Anemia Tobacco Abuse  Disposition: Left AMA  Brief Hospital Course:   Patient was admitted to rule out ACS. This was ruled out with troponins, EKG during her hospitalization. Chest X-Ray revealed health care associated pneumonia. This was treated with IV antibiotics, but she left AMA before she could be placed on PO antibiotics. An Echocardiogram was performed which demonstrated an improved EF from her previous echo of 40-45%. There remains a small pericardial effusion. Dialysis was provided for her ESRD, and Haldol was continued for her Schizophrenia. She left AMA before she could be started on or given oral antibiotics. She said that she was coming back to get dialysis in the morning. She understood at that time the danger to her health, and risk of significant morbidity and mortality of untreated pneumonia.   Significant Procedures: None  Significant Labs and Imaging:   Recent Labs Lab 08/21/14 0950 08/22/14 0256 08/23/14 0940  WBC 5.3 4.2 3.6*  HGB 8.6* 8.3* 9.3*  HCT 26.9* 26.5* 29.7*  PLT 178 226 299    Recent Labs Lab 08/18/14 1230 08/21/14 0950 08/22/14 0256 08/22/14 1140 08/23/14 0940  NA 136 137 136  --  136  K 4.9 4.8 4.5  --  4.3  CL 98* 98* 98*  --  98*  CO2 22 22 24   --  24  GLUCOSE 77 68 82  --  74  BUN 51* 56* 30*  --  42*  CREATININE 12.01* 13.59* 8.94*  --  12.03*  CALCIUM 9.1 9.2 9.3  --  9.4  MG  --   --    --  2.4  --   PHOS 11.7*  --   --  7.7*  --   ALBUMIN 2.9*  --   --   --   --       Results/Tests Pending at Time of Discharge: none    Aquilla Hacker, MD 08/23/2014, 6:46 PM PGY-1, [place]

## 2014-08-25 ENCOUNTER — Encounter (HOSPITAL_COMMUNITY): Payer: Self-pay | Admitting: Family Medicine

## 2014-08-25 ENCOUNTER — Emergency Department (HOSPITAL_COMMUNITY)
Admission: EM | Admit: 2014-08-25 | Discharge: 2014-08-25 | Disposition: A | Discharge status: Home / Self Care | Payer: Medicare Other | Attending: Emergency Medicine | Admitting: Emergency Medicine

## 2014-08-25 DIAGNOSIS — Z79899 Other long term (current) drug therapy: Secondary | ICD-10-CM | POA: Diagnosis not present

## 2014-08-25 DIAGNOSIS — F29 Unspecified psychosis not due to a substance or known physiological condition: Secondary | ICD-10-CM | POA: Insufficient documentation

## 2014-08-25 DIAGNOSIS — Z720 Tobacco use: Secondary | ICD-10-CM | POA: Diagnosis not present

## 2014-08-25 DIAGNOSIS — Z862 Personal history of diseases of the blood and blood-forming organs and certain disorders involving the immune mechanism: Secondary | ICD-10-CM | POA: Insufficient documentation

## 2014-08-25 DIAGNOSIS — I120 Hypertensive chronic kidney disease with stage 5 chronic kidney disease or end stage renal disease: Secondary | ICD-10-CM | POA: Insufficient documentation

## 2014-08-25 DIAGNOSIS — H05223 Edema of bilateral orbit: Secondary | ICD-10-CM | POA: Insufficient documentation

## 2014-08-25 DIAGNOSIS — N186 End stage renal disease: Secondary | ICD-10-CM

## 2014-08-25 DIAGNOSIS — Z4931 Encounter for adequacy testing for hemodialysis: Secondary | ICD-10-CM | POA: Diagnosis not present

## 2014-08-25 DIAGNOSIS — Z992 Dependence on renal dialysis: Secondary | ICD-10-CM | POA: Diagnosis not present

## 2014-08-25 LAB — CBC WITH DIFFERENTIAL/PLATELET
BASOS ABS: 0 10*3/uL (ref 0.0–0.1)
Basophils Relative: 1 % (ref 0–1)
EOS PCT: 11 % — AB (ref 0–5)
Eosinophils Absolute: 0.5 10*3/uL (ref 0.0–0.7)
HEMATOCRIT: 25.6 % — AB (ref 36.0–46.0)
Hemoglobin: 8 g/dL — ABNORMAL LOW (ref 12.0–15.0)
LYMPHS ABS: 1.1 10*3/uL (ref 0.7–4.0)
LYMPHS PCT: 26 % (ref 12–46)
MCH: 26.1 pg (ref 26.0–34.0)
MCHC: 31.3 g/dL (ref 30.0–36.0)
MCV: 83.7 fL (ref 78.0–100.0)
MONO ABS: 0.4 10*3/uL (ref 0.1–1.0)
Monocytes Relative: 9 % (ref 3–12)
Neutro Abs: 2.2 10*3/uL (ref 1.7–7.7)
Neutrophils Relative %: 53 % (ref 43–77)
PLATELETS: 295 10*3/uL (ref 150–400)
RBC: 3.06 MIL/uL — AB (ref 3.87–5.11)
RDW: 19.7 % — ABNORMAL HIGH (ref 11.5–15.5)
WBC: 4.2 10*3/uL (ref 4.0–10.5)

## 2014-08-25 LAB — RENAL FUNCTION PANEL
ANION GAP: 17 — AB (ref 5–15)
Albumin: 2.9 g/dL — ABNORMAL LOW (ref 3.5–5.0)
BUN: 61 mg/dL — ABNORMAL HIGH (ref 6–20)
CALCIUM: 9 mg/dL (ref 8.9–10.3)
CO2: 20 mmol/L — AB (ref 22–32)
Chloride: 100 mmol/L — ABNORMAL LOW (ref 101–111)
Creatinine, Ser: 15.95 mg/dL — ABNORMAL HIGH (ref 0.44–1.00)
GFR calc Af Amer: 3 mL/min — ABNORMAL LOW (ref >60)
GFR calc non Af Amer: 3 mL/min — ABNORMAL LOW (ref >60)
GLUCOSE: 124 mg/dL — AB (ref 65–99)
Phosphorus: 11.6 mg/dL — ABNORMAL HIGH (ref 2.5–4.6)
Potassium: 4.9 mmol/L (ref 3.5–5.1)
SODIUM: 137 mmol/L (ref 135–145)

## 2014-08-25 LAB — I-STAT CHEM 8, ED
BUN: 57 mg/dL — ABNORMAL HIGH (ref 6–20)
CREATININE: 15.6 mg/dL — AB (ref 0.44–1.00)
Calcium, Ion: 1.11 mmol/L — ABNORMAL LOW (ref 1.12–1.23)
Chloride: 103 mmol/L (ref 101–111)
Glucose, Bld: 81 mg/dL (ref 65–99)
HCT: 27 % — ABNORMAL LOW (ref 36.0–46.0)
Hemoglobin: 9.2 g/dL — ABNORMAL LOW (ref 12.0–15.0)
Potassium: 4.9 mmol/L (ref 3.5–5.1)
SODIUM: 137 mmol/L (ref 135–145)
TCO2: 19 mmol/L (ref 0–100)

## 2014-08-25 MED ORDER — HEPARIN SODIUM (PORCINE) 1000 UNIT/ML DIALYSIS
6000.0000 [IU] | Freq: Once | INTRAMUSCULAR | Status: DC
  Filled 2014-08-25: qty 6

## 2014-08-25 MED ORDER — HEPARIN SODIUM (PORCINE) 1000 UNIT/ML DIALYSIS
1000.0000 [IU] | INTRAMUSCULAR | Status: DC | PRN
  Filled 2014-08-25: qty 1

## 2014-08-25 MED ORDER — LIDOCAINE HCL (PF) 1 % IJ SOLN: 5.0000 mL | INTRAMUSCULAR | Status: DC | PRN

## 2014-08-25 MED ORDER — PENTAFLUOROPROP-TETRAFLUOROETH EX AERO
1.0000 | INHALATION_SPRAY | CUTANEOUS | Status: DC | PRN
Start: 2014-08-25 — End: 2014-08-25
  Filled 2014-08-25: qty 30

## 2014-08-25 MED ORDER — LIDOCAINE-PRILOCAINE 2.5-2.5 % EX CREA
1.0000 | TOPICAL_CREAM | CUTANEOUS | Status: DC | PRN
Start: 2014-08-25 — End: 2014-08-25
  Filled 2014-08-25: qty 5

## 2014-08-25 MED ORDER — SODIUM CHLORIDE 0.9 % IV SOLN: 100.0000 mL | INTRAVENOUS | Status: DC | PRN

## 2014-08-25 MED ORDER — NEPRO/CARBSTEADY PO LIQD
237.0000 mL | ORAL | Status: DC | PRN
  Filled 2014-08-25: qty 237

## 2014-08-25 MED ORDER — ALTEPLASE 2 MG IJ SOLR
2.0000 mg | Freq: Once | INTRAMUSCULAR | Status: DC | PRN
  Filled 2014-08-25: qty 2

## 2014-08-25 NOTE — ED Notes (Signed)
Tray ordered for patient.

## 2014-08-25 NOTE — ED Notes (Signed)
PT reports has been using the [facility] for dialysis x2 months, reports could not wait on Wednesday so has not had dialysis since Monday. Pt denies any other complaints, denies pain
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
## 2014-08-25 NOTE — Progress Notes (Signed)
Patient says she has to leave because her ride is on the way. 2 hours left of treatment. Notifying MD.

## 2014-08-25 NOTE — Progress Notes (Signed)
Patient signed off HD tx 1.75 hours early. Her ride is waiting for her. MD notified.

## 2014-08-25 NOTE — ED Notes (Signed)
Meal tray at bedside.  

## 2014-08-25 NOTE — Procedures (Signed)
I was present at this dialysis session, have reviewed the session itself and made  appropriate changes  Kelly Splinter MD (pgr) [phone]    [phone] 08/25/2014, 3:30 PM

## 2014-08-27 LAB — CULTURE, BLOOD (ROUTINE X 2): CULTURE: NO GROWTH

## 2014-08-28 ENCOUNTER — Emergency Department (HOSPITAL_COMMUNITY)
Admission: EM | Admit: 2014-08-28 | Discharge: 2014-08-30 | Disposition: A | Discharge status: Home / Self Care | Payer: Medicare Other | Attending: Emergency Medicine | Admitting: Emergency Medicine

## 2014-08-28 ENCOUNTER — Encounter (HOSPITAL_COMMUNITY): Payer: Self-pay

## 2014-08-28 ENCOUNTER — Emergency Department (HOSPITAL_COMMUNITY): Payer: Medicare Other

## 2014-08-28 DIAGNOSIS — N186 End stage renal disease: Secondary | ICD-10-CM | POA: Diagnosis not present

## 2014-08-28 DIAGNOSIS — I120 Hypertensive chronic kidney disease with stage 5 chronic kidney disease or end stage renal disease: Secondary | ICD-10-CM | POA: Diagnosis not present

## 2014-08-28 DIAGNOSIS — R0789 Other chest pain: Secondary | ICD-10-CM | POA: Insufficient documentation

## 2014-08-28 DIAGNOSIS — Z862 Personal history of diseases of the blood and blood-forming organs and certain disorders involving the immune mechanism: Secondary | ICD-10-CM | POA: Insufficient documentation

## 2014-08-28 DIAGNOSIS — Z720 Tobacco use: Secondary | ICD-10-CM | POA: Insufficient documentation

## 2014-08-28 DIAGNOSIS — R079 Chest pain, unspecified: Secondary | ICD-10-CM | POA: Diagnosis present

## 2014-08-28 DIAGNOSIS — Z8739 Personal history of other diseases of the musculoskeletal system and connective tissue: Secondary | ICD-10-CM | POA: Diagnosis not present

## 2014-08-28 DIAGNOSIS — Z992 Dependence on renal dialysis: Secondary | ICD-10-CM | POA: Insufficient documentation

## 2014-08-28 DIAGNOSIS — F319 Bipolar disorder, unspecified: Secondary | ICD-10-CM | POA: Insufficient documentation

## 2014-08-28 DIAGNOSIS — Z79899 Other long term (current) drug therapy: Secondary | ICD-10-CM | POA: Diagnosis not present

## 2014-08-28 LAB — CULTURE, BLOOD (ROUTINE X 2): Culture: NO GROWTH

## 2014-08-28 LAB — BASIC METABOLIC PANEL
ANION GAP: 15 (ref 5–15)
BUN: 55 mg/dL — ABNORMAL HIGH (ref 6–20)
CALCIUM: 9.4 mg/dL (ref 8.9–10.3)
CHLORIDE: 99 mmol/L — AB (ref 101–111)
CO2: 24 mmol/L (ref 22–32)
Creatinine, Ser: 16.11 mg/dL — ABNORMAL HIGH (ref 0.44–1.00)
GFR calc Af Amer: 3 mL/min — ABNORMAL LOW (ref >60)
GFR calc non Af Amer: 3 mL/min — ABNORMAL LOW (ref >60)
GLUCOSE: 70 mg/dL (ref 65–99)
Potassium: 4.8 mmol/L (ref 3.5–5.1)
SODIUM: 138 mmol/L (ref 135–145)

## 2014-08-28 LAB — CBC WITH DIFFERENTIAL/PLATELET
BASOS ABS: 0 10*3/uL (ref 0.0–0.1)
BASOS PCT: 1 % (ref 0–1)
EOS ABS: 0.3 10*3/uL (ref 0.0–0.7)
Eosinophils Relative: 7 % — ABNORMAL HIGH (ref 0–5)
HCT: 26.5 % — ABNORMAL LOW (ref 36.0–46.0)
Hemoglobin: 8.3 g/dL — ABNORMAL LOW (ref 12.0–15.0)
Lymphocytes Relative: 27 % (ref 12–46)
Lymphs Abs: 1 10*3/uL (ref 0.7–4.0)
MCH: 25.6 pg — ABNORMAL LOW (ref 26.0–34.0)
MCHC: 31.3 g/dL (ref 30.0–36.0)
MCV: 81.8 fL (ref 78.0–100.0)
Monocytes Absolute: 0.3 10*3/uL (ref 0.1–1.0)
Monocytes Relative: 9 % (ref 3–12)
NEUTROS ABS: 2.1 10*3/uL (ref 1.7–7.7)
Neutrophils Relative %: 56 % (ref 43–77)
PLATELETS: 257 10*3/uL (ref 150–400)
RBC: 3.24 MIL/uL — ABNORMAL LOW (ref 3.87–5.11)
RDW: 19.1 % — ABNORMAL HIGH (ref 11.5–15.5)
WBC: 3.8 10*3/uL — AB (ref 4.0–10.5)

## 2014-08-28 MED ORDER — HEPARIN SODIUM (PORCINE) 1000 UNIT/ML DIALYSIS
1000.0000 [IU] | INTRAMUSCULAR | Status: DC | PRN
  Filled 2014-08-28: qty 1

## 2014-08-28 MED ORDER — SODIUM CHLORIDE 0.9 % IV SOLN: 100.0000 mL | INTRAVENOUS | Status: DC | PRN

## 2014-08-28 MED ORDER — NEPRO/CARBSTEADY PO LIQD
237.0000 mL | ORAL | Status: DC | PRN
  Filled 2014-08-28: qty 237

## 2014-08-28 MED ORDER — HEPARIN SODIUM (PORCINE) 1000 UNIT/ML DIALYSIS
100.0000 [IU]/kg | INTRAMUSCULAR | Status: DC | PRN
  Filled 2014-08-28: qty 7

## 2014-08-28 MED ORDER — LIDOCAINE HCL (PF) 1 % IJ SOLN: 5.0000 mL | INTRAMUSCULAR | Status: DC | PRN

## 2014-08-28 MED ORDER — PENTAFLUOROPROP-TETRAFLUOROETH EX AERO
1.0000 "application " | INHALATION_SPRAY | CUTANEOUS | Status: DC | PRN
  Filled 2014-08-28: qty 30

## 2014-08-28 MED ORDER — LIDOCAINE-PRILOCAINE 2.5-2.5 % EX CREA
1.0000 "application " | TOPICAL_CREAM | CUTANEOUS | Status: DC | PRN
  Filled 2014-08-28: qty 5

## 2014-08-28 NOTE — ED Provider Notes (Signed)
Patient here for dialysis. She receives hemodialysis Mondays Wednesday's Fridays. She also complains of left sided parasternal nonradiating chest pain constant since 08/26/2014. Pain is worse with coughing or sneezing. No shortness of breath no nausea no sweatiness. On exam no distress lungs clear auscultation heart regular rate and rhythm abdomen nondistended nontender right upper extremity with dialysis fistula with good thrill  Orlie Dakin, MD 08/28/14 [phone]

## 2014-08-28 NOTE — ED Notes (Signed)
Pt here for [name] dialysis. Reports having chest pain since Saturday but decided to wait until today to get checked out. Pt reports pain is worse when she breathes.
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
## 2014-08-28 NOTE — Procedures (Signed)
I was present at this dialysis session. I have reviewed the session itself and made appropriate changes.   Pearson Grippe  MD 08/28/2014, 2:53 PM

## 2014-08-28 NOTE — ED Notes (Signed)
Debbie, NS ordered pts lunch tray

## 2014-08-30 ENCOUNTER — Encounter (HOSPITAL_COMMUNITY): Payer: Self-pay | Admitting: Emergency Medicine

## 2014-08-30 ENCOUNTER — Emergency Department (HOSPITAL_COMMUNITY)
Admission: EM | Admit: 2014-08-30 | Discharge: 2014-08-30 | Disposition: A | Discharge status: Home / Self Care | Payer: Medicare Other | Attending: Emergency Medicine | Admitting: Emergency Medicine

## 2014-08-30 DIAGNOSIS — Z992 Dependence on renal dialysis: Secondary | ICD-10-CM

## 2014-08-30 DIAGNOSIS — Z720 Tobacco use: Secondary | ICD-10-CM | POA: Diagnosis not present

## 2014-08-30 DIAGNOSIS — F29 Unspecified psychosis not due to a substance or known physiological condition: Secondary | ICD-10-CM | POA: Diagnosis not present

## 2014-08-30 DIAGNOSIS — Z8739 Personal history of other diseases of the musculoskeletal system and connective tissue: Secondary | ICD-10-CM | POA: Diagnosis not present

## 2014-08-30 DIAGNOSIS — N186 End stage renal disease: Secondary | ICD-10-CM | POA: Insufficient documentation

## 2014-08-30 DIAGNOSIS — I120 Hypertensive chronic kidney disease with stage 5 chronic kidney disease or end stage renal disease: Secondary | ICD-10-CM | POA: Insufficient documentation

## 2014-08-30 DIAGNOSIS — Z79899 Other long term (current) drug therapy: Secondary | ICD-10-CM | POA: Insufficient documentation

## 2014-08-30 DIAGNOSIS — Z862 Personal history of diseases of the blood and blood-forming organs and certain disorders involving the immune mechanism: Secondary | ICD-10-CM | POA: Diagnosis not present

## 2014-08-30 LAB — BASIC METABOLIC PANEL
ANION GAP: 14 (ref 5–15)
BUN: 44 mg/dL — AB (ref 6–20)
CO2: 26 mmol/L (ref 22–32)
Calcium: 9.2 mg/dL (ref 8.9–10.3)
Chloride: 100 mmol/L — ABNORMAL LOW (ref 101–111)
Creatinine, Ser: 13.43 mg/dL — ABNORMAL HIGH (ref 0.44–1.00)
GFR calc non Af Amer: 3 mL/min — ABNORMAL LOW (ref >60)
GFR, EST AFRICAN AMERICAN: 4 mL/min — AB (ref >60)
Glucose, Bld: 82 mg/dL (ref 65–99)
POTASSIUM: 4.3 mmol/L (ref 3.5–5.1)
Sodium: 140 mmol/L (ref 135–145)

## 2014-08-30 LAB — CBC WITH DIFFERENTIAL/PLATELET
BASOS PCT: 1 % (ref 0–1)
Basophils Absolute: 0 10*3/uL (ref 0.0–0.1)
EOS PCT: 6 % — AB (ref 0–5)
Eosinophils Absolute: 0.3 10*3/uL (ref 0.0–0.7)
HCT: 28.4 % — ABNORMAL LOW (ref 36.0–46.0)
Hemoglobin: 8.7 g/dL — ABNORMAL LOW (ref 12.0–15.0)
Lymphocytes Relative: 23 % (ref 12–46)
Lymphs Abs: 0.9 10*3/uL (ref 0.7–4.0)
MCH: 25.4 pg — ABNORMAL LOW (ref 26.0–34.0)
MCHC: 30.6 g/dL (ref 30.0–36.0)
MCV: 83 fL (ref 78.0–100.0)
Monocytes Absolute: 0.4 10*3/uL (ref 0.1–1.0)
Monocytes Relative: 10 % (ref 3–12)
NEUTROS ABS: 2.3 10*3/uL (ref 1.7–7.7)
Neutrophils Relative %: 60 % (ref 43–77)
Platelets: 240 10*3/uL (ref 150–400)
RBC: 3.42 MIL/uL — AB (ref 3.87–5.11)
RDW: 19.2 % — AB (ref 11.5–15.5)
WBC: 3.9 10*3/uL — ABNORMAL LOW (ref 4.0–10.5)

## 2014-08-30 MED ORDER — SODIUM CHLORIDE 0.9 % IV SOLN: 100.0000 mL | INTRAVENOUS | Status: DC | PRN

## 2014-08-30 MED ORDER — DARBEPOETIN ALFA 100 MCG/0.5ML IJ SOSY
100.0000 ug | PREFILLED_SYRINGE | Freq: Once | INTRAMUSCULAR | Status: AC
Start: 2014-08-30 — End: 2014-08-30
  Administered 2014-08-30: 100 ug via INTRAVENOUS
  Filled 2014-08-30: qty 0.5

## 2014-08-30 MED ORDER — ALTEPLASE 2 MG IJ SOLR: 2.0000 mg | Freq: Once | INTRAMUSCULAR | Status: DC | PRN

## 2014-08-30 MED ORDER — LIDOCAINE-PRILOCAINE 2.5-2.5 % EX CREA: 1.0000 "application " | TOPICAL_CREAM | CUTANEOUS | Status: DC | PRN

## 2014-08-30 MED ORDER — ACETAMINOPHEN 325 MG PO TABS
650.0000 mg | ORAL_TABLET | Freq: Once | ORAL | Status: AC
  Administered 2014-08-30: 650 mg via ORAL

## 2014-08-30 MED ORDER — LIDOCAINE HCL (PF) 1 % IJ SOLN: 5.0000 mL | INTRAMUSCULAR | Status: DC | PRN

## 2014-08-30 MED ORDER — HEPARIN SODIUM (PORCINE) 1000 UNIT/ML DIALYSIS: 100.0000 [IU]/kg | INTRAMUSCULAR | Status: DC | PRN

## 2014-08-30 MED ORDER — HEPARIN SODIUM (PORCINE) 1000 UNIT/ML DIALYSIS: 1000.0000 [IU] | INTRAMUSCULAR | Status: DC | PRN

## 2014-08-30 MED ORDER — ACETAMINOPHEN 325 MG PO TABS
ORAL_TABLET | ORAL | Status: AC
  Filled 2014-08-30: qty 2

## 2014-08-30 MED ORDER — NEPRO/CARBSTEADY PO LIQD: 237.0000 mL | ORAL | Status: DC | PRN

## 2014-08-30 MED ORDER — PENTAFLUOROPROP-TETRAFLUOROETH EX AERO: 1.0000 "application " | INHALATION_SPRAY | CUTANEOUS | Status: DC | PRN

## 2014-08-30 NOTE — Progress Notes (Signed)
Hemodialysis tx ended 58 minutes early. Dialyzer clotted and pt stated she did not want the machine reset up to complete the remaining 58 minutes.  VSS and pt without complaint.  Pt being discharged to home with self care.

## 2014-08-30 NOTE — ED Notes (Signed)
Pt also complains of center chest pain that radiates to left chest also, described as sharp stabbing and worse with inspiration.

## 2014-08-30 NOTE — ED Notes (Signed)
Spoke with dialysis; since pt is refusing IV, dialysis RN says they will give that upstairs once fistula is accessed for dialysis.

## 2014-08-30 NOTE — ED Notes (Signed)
Ordered renal diet tray for PT @ 10am

## 2014-08-30 NOTE — ED Notes (Signed)
Pt here for dialysis - has been coming here for last 2 months. Last dialysis was Monday. A/O x4. VSS.

## 2014-08-30 NOTE — ED Notes (Signed)
Pt refusing an IV at this time, pt states "it's not necessary."

## 2014-08-30 NOTE — ED Notes (Signed)
Aranesep sent with pt
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
## 2014-09-01 ENCOUNTER — Encounter (HOSPITAL_COMMUNITY): Payer: Self-pay | Admitting: Physical Medicine and Rehabilitation

## 2014-09-01 ENCOUNTER — Emergency Department (HOSPITAL_COMMUNITY)
Admission: EM | Admit: 2014-09-01 | Discharge: 2014-09-01 | Discharge status: Against Medical Advice | Payer: Medicare Other | Attending: Emergency Medicine | Admitting: Emergency Medicine

## 2014-09-01 DIAGNOSIS — Z720 Tobacco use: Secondary | ICD-10-CM | POA: Insufficient documentation

## 2014-09-01 DIAGNOSIS — N186 End stage renal disease: Secondary | ICD-10-CM | POA: Insufficient documentation

## 2014-09-01 DIAGNOSIS — I120 Hypertensive chronic kidney disease with stage 5 chronic kidney disease or end stage renal disease: Secondary | ICD-10-CM | POA: Insufficient documentation

## 2014-09-01 DIAGNOSIS — Z992 Dependence on renal dialysis: Secondary | ICD-10-CM | POA: Insufficient documentation

## 2014-09-01 DIAGNOSIS — Z452 Encounter for adjustment and management of vascular access device: Secondary | ICD-10-CM | POA: Diagnosis present

## 2014-09-01 NOTE — ED Notes (Signed)
Pt presents to department for hemodialysis. Denies pain. Pt is alert and oriented x4.

## 2014-09-01 NOTE — ED Notes (Signed)
The patient told ED staff that she was leaving and will come back tomorrow morning

## 2014-09-03 ENCOUNTER — Non-Acute Institutional Stay (HOSPITAL_COMMUNITY)
Admission: EM | Admit: 2014-09-03 | Discharge: 2014-09-03 | Discharge status: Against Medical Advice | Payer: Medicare Other | Attending: Emergency Medicine | Admitting: Emergency Medicine

## 2014-09-03 ENCOUNTER — Emergency Department (HOSPITAL_COMMUNITY): Payer: Medicare Other

## 2014-09-03 ENCOUNTER — Encounter (HOSPITAL_COMMUNITY): Payer: Self-pay | Admitting: *Deleted

## 2014-09-03 DIAGNOSIS — Z992 Dependence on renal dialysis: Secondary | ICD-10-CM | POA: Insufficient documentation

## 2014-09-03 DIAGNOSIS — J90 Pleural effusion, not elsewhere classified: Secondary | ICD-10-CM | POA: Insufficient documentation

## 2014-09-03 DIAGNOSIS — R079 Chest pain, unspecified: Secondary | ICD-10-CM | POA: Insufficient documentation

## 2014-09-03 DIAGNOSIS — N186 End stage renal disease: Secondary | ICD-10-CM

## 2014-09-03 LAB — CBC WITH DIFFERENTIAL/PLATELET
Basophils Absolute: 0 10*3/uL (ref 0.0–0.1)
Basophils Relative: 0 % (ref 0–1)
EOS ABS: 0.2 10*3/uL (ref 0.0–0.7)
EOS PCT: 3 % (ref 0–5)
HEMATOCRIT: 27.2 % — AB (ref 36.0–46.0)
Hemoglobin: 8.7 g/dL — ABNORMAL LOW (ref 12.0–15.0)
LYMPHS ABS: 1.1 10*3/uL (ref 0.7–4.0)
Lymphocytes Relative: 20 % (ref 12–46)
MCH: 26.5 pg (ref 26.0–34.0)
MCHC: 32 g/dL (ref 30.0–36.0)
MCV: 82.9 fL (ref 78.0–100.0)
MONOS PCT: 10 % (ref 3–12)
Monocytes Absolute: 0.5 10*3/uL (ref 0.1–1.0)
NEUTROS PCT: 67 % (ref 43–77)
Neutro Abs: 3.5 10*3/uL (ref 1.7–7.7)
PLATELETS: 208 10*3/uL (ref 150–400)
RBC: 3.28 MIL/uL — AB (ref 3.87–5.11)
RDW: 19.5 % — AB (ref 11.5–15.5)
WBC: 5.3 10*3/uL (ref 4.0–10.5)

## 2014-09-03 LAB — BASIC METABOLIC PANEL
Anion gap: 20 — ABNORMAL HIGH (ref 5–15)
BUN: 76 mg/dL — ABNORMAL HIGH (ref 6–20)
CHLORIDE: 96 mmol/L — AB (ref 101–111)
CO2: 19 mmol/L — ABNORMAL LOW (ref 22–32)
Calcium: 9.1 mg/dL (ref 8.9–10.3)
Creatinine, Ser: 16.27 mg/dL — ABNORMAL HIGH (ref 0.44–1.00)
GFR calc Af Amer: 3 mL/min — ABNORMAL LOW (ref >60)
GFR calc non Af Amer: 3 mL/min — ABNORMAL LOW (ref >60)
GLUCOSE: 71 mg/dL (ref 65–99)
POTASSIUM: 5.1 mmol/L (ref 3.5–5.1)
Sodium: 135 mmol/L (ref 135–145)

## 2014-09-03 LAB — I-STAT CHEM 8, ED
BUN: 74 mg/dL — AB (ref 6–20)
CALCIUM ION: 1.13 mmol/L (ref 1.12–1.23)
CREATININE: 15.6 mg/dL — AB (ref 0.44–1.00)
Chloride: 101 mmol/L (ref 101–111)
Glucose, Bld: 71 mg/dL (ref 65–99)
HCT: 31 % — ABNORMAL LOW (ref 36.0–46.0)
HEMOGLOBIN: 10.5 g/dL — AB (ref 12.0–15.0)
POTASSIUM: 5.1 mmol/L (ref 3.5–5.1)
SODIUM: 136 mmol/L (ref 135–145)
TCO2: 18 mmol/L (ref 0–100)

## 2014-09-03 LAB — I-STAT TROPONIN, ED: Troponin i, poc: 0.03 ng/mL (ref 0.00–0.08)

## 2014-09-03 MED ORDER — OXYCODONE-ACETAMINOPHEN 5-325 MG PO TABS
1.0000 | ORAL_TABLET | Freq: Once | ORAL | Status: AC
Start: 2014-09-03 — End: 2014-09-03
  Administered 2014-09-03: 1 via ORAL
  Filled 2014-09-03: qty 1

## 2014-09-03 NOTE — ED Notes (Signed)
Patient states she needs dialysis, last dialysis was wed. States she gets dialysis her at the hospital because she has had issues at the kidney center. States she was her on Fri. And waited 5 hours and didn't get dialysis. C/o chest pain across her chest worse with inspiration. Occ. Cough.

## 2014-09-03 NOTE — ED Notes (Signed)
Patient transported to X-ray 

## 2014-09-03 NOTE — ED Notes (Signed)
Patient stated she wanted to leave and come back tomorrow. States she doesn't want to wait she will return tomorrow. Dr. Jeneen Rinks aware. I spoke with patient at length about the risk of leaving , told patient her K was elevated and the risk however she stated she wanted to leave and would come back tomorrow

## 2014-09-03 NOTE — ED Notes (Addendum)
Called Hemodialysis, sts will be ready for pt in about 4-5 hours. Pt notified about delay.

## 2014-09-03 NOTE — ED Notes (Signed)
Patient aware she will have to wait for a while for dialysis

## 2014-09-03 NOTE — ED Notes (Signed)
Pt eating meal. Requesting pain meds
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
## 2014-09-04 ENCOUNTER — Non-Acute Institutional Stay (HOSPITAL_COMMUNITY)
Admission: EM | Admit: 2014-09-04 | Discharge: 2014-09-04 | Disposition: A | Discharge status: Home / Self Care | Payer: Medicare Other | Attending: Emergency Medicine | Admitting: Emergency Medicine

## 2014-09-04 ENCOUNTER — Encounter (HOSPITAL_COMMUNITY): Payer: Self-pay | Admitting: *Deleted

## 2014-09-04 DIAGNOSIS — M545 Low back pain: Secondary | ICD-10-CM | POA: Diagnosis not present

## 2014-09-04 DIAGNOSIS — E8770 Fluid overload, unspecified: Secondary | ICD-10-CM | POA: Diagnosis not present

## 2014-09-04 DIAGNOSIS — F209 Schizophrenia, unspecified: Secondary | ICD-10-CM | POA: Diagnosis not present

## 2014-09-04 DIAGNOSIS — R079 Chest pain, unspecified: Secondary | ICD-10-CM | POA: Diagnosis present

## 2014-09-04 DIAGNOSIS — M3214 Glomerular disease in systemic lupus erythematosus: Secondary | ICD-10-CM | POA: Diagnosis not present

## 2014-09-04 DIAGNOSIS — J90 Pleural effusion, not elsewhere classified: Secondary | ICD-10-CM | POA: Diagnosis not present

## 2014-09-04 DIAGNOSIS — D649 Anemia, unspecified: Secondary | ICD-10-CM | POA: Diagnosis not present

## 2014-09-04 DIAGNOSIS — F319 Bipolar disorder, unspecified: Secondary | ICD-10-CM | POA: Insufficient documentation

## 2014-09-04 DIAGNOSIS — Z881 Allergy status to other antibiotic agents status: Secondary | ICD-10-CM | POA: Insufficient documentation

## 2014-09-04 DIAGNOSIS — E8779 Other fluid overload: Secondary | ICD-10-CM

## 2014-09-04 DIAGNOSIS — Z992 Dependence on renal dialysis: Secondary | ICD-10-CM | POA: Insufficient documentation

## 2014-09-04 DIAGNOSIS — N186 End stage renal disease: Secondary | ICD-10-CM | POA: Diagnosis not present

## 2014-09-04 DIAGNOSIS — F17210 Nicotine dependence, cigarettes, uncomplicated: Secondary | ICD-10-CM | POA: Diagnosis not present

## 2014-09-04 DIAGNOSIS — I120 Hypertensive chronic kidney disease with stage 5 chronic kidney disease or end stage renal disease: Secondary | ICD-10-CM | POA: Insufficient documentation

## 2014-09-04 DIAGNOSIS — Z888 Allergy status to other drugs, medicaments and biological substances status: Secondary | ICD-10-CM | POA: Diagnosis not present

## 2014-09-04 DIAGNOSIS — Z79899 Other long term (current) drug therapy: Secondary | ICD-10-CM | POA: Diagnosis not present

## 2014-09-04 LAB — I-STAT TROPONIN, ED: TROPONIN I, POC: 0.05 ng/mL (ref 0.00–0.08)

## 2014-09-04 LAB — BASIC METABOLIC PANEL
Anion gap: 20 — ABNORMAL HIGH (ref 5–15)
BUN: 88 mg/dL — ABNORMAL HIGH (ref 6–20)
CO2: 20 mmol/L — AB (ref 22–32)
Calcium: 9.3 mg/dL (ref 8.9–10.3)
Chloride: 97 mmol/L — ABNORMAL LOW (ref 101–111)
Creatinine, Ser: 17.81 mg/dL — ABNORMAL HIGH (ref 0.44–1.00)
GFR calc Af Amer: 3 mL/min — ABNORMAL LOW (ref >60)
GFR, EST NON AFRICAN AMERICAN: 2 mL/min — AB (ref >60)
Glucose, Bld: 90 mg/dL (ref 65–99)
Potassium: 4.9 mmol/L (ref 3.5–5.1)
Sodium: 137 mmol/L (ref 135–145)

## 2014-09-04 LAB — CBC
HEMATOCRIT: 23.6 % — AB (ref 36.0–46.0)
Hemoglobin: 7.7 g/dL — ABNORMAL LOW (ref 12.0–15.0)
MCH: 26.2 pg (ref 26.0–34.0)
MCHC: 32.6 g/dL (ref 30.0–36.0)
MCV: 80.3 fL (ref 78.0–100.0)
Platelets: 286 10*3/uL (ref 150–400)
RBC: 2.94 MIL/uL — AB (ref 3.87–5.11)
RDW: 19.6 % — ABNORMAL HIGH (ref 11.5–15.5)
WBC: 4.8 10*3/uL (ref 4.0–10.5)

## 2014-09-04 LAB — I-STAT CHEM 8, ED
BUN: 89 mg/dL — AB (ref 6–20)
CALCIUM ION: 1.16 mmol/L (ref 1.12–1.23)
Chloride: 102 mmol/L (ref 101–111)
GLUCOSE: 89 mg/dL (ref 65–99)
HEMATOCRIT: 27 % — AB (ref 36.0–46.0)
HEMOGLOBIN: 9.2 g/dL — AB (ref 12.0–15.0)
POTASSIUM: 4.9 mmol/L (ref 3.5–5.1)
Sodium: 133 mmol/L — ABNORMAL LOW (ref 135–145)
TCO2: 18 mmol/L (ref 0–100)

## 2014-09-04 MED ORDER — OXYCODONE-ACETAMINOPHEN 5-325 MG PO TABS
1.0000 | ORAL_TABLET | Freq: Once | ORAL | Status: AC
  Administered 2014-09-04: 1 via ORAL
  Filled 2014-09-04: qty 1

## 2014-09-04 MED ORDER — DARBEPOETIN ALFA 100 MCG/0.5ML IJ SOSY
100.0000 ug | PREFILLED_SYRINGE | INTRAMUSCULAR | Status: DC
  Administered 2014-09-04: 100 ug via INTRAVENOUS

## 2014-09-04 MED ORDER — DARBEPOETIN ALFA 100 MCG/0.5ML IJ SOSY
PREFILLED_SYRINGE | INTRAMUSCULAR | Status: AC
  Filled 2014-09-04: qty 0.5

## 2014-09-04 NOTE — ED Notes (Signed)
Pt in stating she needs dialysis, last treatment was on Wednesday, states she came in over the weekend but the wait was too long, now c/o chest pain and shortness of breath, no distress noted

## 2014-09-04 NOTE — ED Notes (Signed)
Report given to RN in hemodialysis, pt transported to unit for treatment.

## 2014-09-04 NOTE — ED Notes (Signed)
Meal at bedside

## 2014-09-04 NOTE — ED Notes (Signed)
Ordered heart healthy lunch tray.
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
## 2014-09-04 NOTE — ED Notes (Addendum)
Pt states L sided chest pain and SOB. Onset this morning. Last HD treatment on Wednesday. 5/10 L chest pain upon arrival to ED. Respirations unlabored. Speaking complete sentences. Pt is alert and oriented x4.

## 2014-09-05 ENCOUNTER — Emergency Department (HOSPITAL_COMMUNITY)
Admission: EM | Admit: 2014-09-05 | Discharge: 2014-09-05 | Disposition: A | Discharge status: Home / Self Care | Payer: Medicare Other | Attending: Emergency Medicine | Admitting: Emergency Medicine

## 2014-09-05 ENCOUNTER — Encounter (HOSPITAL_COMMUNITY): Payer: Self-pay | Admitting: Physical Medicine and Rehabilitation

## 2014-09-05 DIAGNOSIS — Z4901 Encounter for fitting and adjustment of extracorporeal dialysis catheter: Secondary | ICD-10-CM | POA: Diagnosis present

## 2014-09-05 DIAGNOSIS — Z862 Personal history of diseases of the blood and blood-forming organs and certain disorders involving the immune mechanism: Secondary | ICD-10-CM | POA: Diagnosis not present

## 2014-09-05 DIAGNOSIS — I120 Hypertensive chronic kidney disease with stage 5 chronic kidney disease or end stage renal disease: Secondary | ICD-10-CM | POA: Insufficient documentation

## 2014-09-05 DIAGNOSIS — Z8659 Personal history of other mental and behavioral disorders: Secondary | ICD-10-CM | POA: Insufficient documentation

## 2014-09-05 DIAGNOSIS — Z79899 Other long term (current) drug therapy: Secondary | ICD-10-CM | POA: Insufficient documentation

## 2014-09-05 DIAGNOSIS — Z992 Dependence on renal dialysis: Secondary | ICD-10-CM

## 2014-09-05 DIAGNOSIS — Z720 Tobacco use: Secondary | ICD-10-CM | POA: Diagnosis not present

## 2014-09-05 DIAGNOSIS — Z8739 Personal history of other diseases of the musculoskeletal system and connective tissue: Secondary | ICD-10-CM | POA: Diagnosis not present

## 2014-09-05 DIAGNOSIS — N186 End stage renal disease: Secondary | ICD-10-CM | POA: Diagnosis not present
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
## 2014-09-05 NOTE — Discharge Instructions (Signed)
Dialysis °Dialysis is a procedure that replaces some of the work healthy kidneys do. It is done when you lose about 85-90% of your kidney function. It may also be done earlier if your symptoms may be improved by dialysis. During dialysis, wastes, salt, and extra water are removed from the blood, and the levels of certain chemicals in the blood (such as potassium) are maintained. Dialysis is done in sessions. Dialysis sessions are continued until the kidneys get better. If the kidneys cannot get better, such as in end-stage kidney disease, dialysis is continued for life or until you receive a new kidney (kidney transplant). There are two types of dialysis: hemodialysis and peritoneal dialysis. °WHAT IS HEMODIALYSIS?  °Hemodialysis is a type of dialysis in which a machine called a dialyzer is used to filter the blood. Before beginning hemodialysis, you will have surgery to create a site where blood can be removed from the body and returned to the body (vascular access). There are three types of vascular accesses: °· Arteriovenous fistula. To create this type of access, an artery is connected to a vein (usually in the arm). A fistula takes 1-6 months to develop after surgery. If it develops properly, it usually lasts longer than the other types of vascular accesses. It is also less likely to become infected and cause blood clots. °· Arteriovenous graft. To create this type of access, an artery and a vein in the arm are connected with a tube. A graft may be used within 2-3 weeks of surgery. °· A venous catheter. To create this type of access, a thin, flexible tube (catheter) is placed in a large vein in your neck, chest, or groin. A catheter may be used right away. It is usually used as a temporary access when dialysis needs to begin immediately. °During hemodialysis, blood leaves the body through your access. It travels through a tube to the dialyzer, where it is filtered. The blood then returns to your body through  another tube. °Hemodialysis is usually performed by a health care provider at a hospital or dialysis center three times a week. Visits last about 3-4 hours. It may also be performed with the help of another person at home with training.  °WHAT IS PERITONEAL DIALYSIS? °Peritoneal dialysis is a type of dialysis in which the thin lining of the abdomen (peritoneum) is used as a filter. Before beginning peritoneal dialysis, you will have surgery to place a catheter in your abdomen. The catheter will be used to transfer a fluid called dialysate to and from your abdomen. At the start of a session, your abdomen is filled with dialysate. During the session, wastes, salt, and extra water in the blood pass through the peritoneum and into the dialysate. The dialysate is drained from the body at the end of the session. The process of filling and draining the dialysate is called an exchange. Exchanges are repeated until you have used up all the dialysate for the day. °Peritoneal dialysis may be performed by you at home or at almost any other location. It is done every day. You may need up to five exchanges a day. The amount of time the dialysate is in your body between exchanges is called a dwell. The dwell depends on the number of exchanges needed and the characteristics of the peritoneum. It usually varies from 1.5-3 hours. You may go about your day normally between exchanges. Alternately, the exchanges may be done at night while you sleep, using a machine called a cycler. °WHICH TYPE   while you sleep, using a machine called a cycler.  WHICH TYPE OF DIALYSIS SHOULD I CHOOSE?   Both hemodialysis and peritoneal dialysis have advantages and disadvantages. Talk to your health care provider about which type of dialysis would be best for you. Your lifestyle and preferences should be considered along with your medical condition. In some cases, only one type of dialysis may be an option.   Advantages of hemodialysis  · It is done less often than peritoneal dialysis.  · Someone else can do the  dialysis for you.  · If you go to a dialysis center, your health care provider will be able to recognize any problems right away.  · If you go to a dialysis center, you can interact with others who are having dialysis. This can provide you with emotional support.  Disadvantages of hemodialysis  · Hemodialysis may cause cramps and low blood pressure. It may leave you feeling tired on the days you have the treatment.  · If you go to a dialysis center, you will need to make weekly appointments and work around the center's schedule.  · You will need to take extra care when traveling. If you go to a dialysis center, you will need to make special arrangements to visit a dialysis center near your destination. If you are having treatments at home, you will need to take the dialyzer with you to your destination.  · You will need to avoid more foods than you would need to avoid on peritoneal dialysis.  Advantages of peritoneal dialysis  · It is less likely than hemodialysis to cause cramps and low blood pressure.  · You may do exchanges on your own wherever you are, including when you travel.  · You do not need to avoid as many foods as you do on hemodialysis.  Disadvantages of peritoneal dialysis  · It is done more often than hemodialysis.  · Performing peritoneal dialysis requires you to have dexterity of the hands. You must also be able to lift bags.  · You will have to learn sterilization techniques. You will need to practice them every day to reduce the risk of infection.   WHAT CHANGES WILL I NEED TO MAKE TO MY DIET DURING DIALYSIS?  Both hemodialysis and peritoneal dialysis require you to make some changes to your diet. For example, you will need to limit your intake of foods high in the minerals phosphorus and potassium. You will also need to limit your fluid intake. Your dietitian can help you plan meals. A good meal plan can improve your dialysis and your health.   WHAT SHOULD I EXPECT WHEN BEGINNING  DIALYSIS?  Adjusting to the dialysis treatment, schedule, and diet can take some time. You may need to stop working and may not be able to do some of the things you normally do. You may feel anxious or depressed when beginning dialysis. Eventually, many people feel better overall because of dialysis. Some people are able to return to work after making some changes, such as reducing work intensity.  WHERE CAN I FIND MORE INFORMATION?   · National Kidney Foundation: www.kidney.org  · American Association of Kidney Patients: www.aakp.org  · American Kidney Fund: www.kidneyfund.org  Document Released: 06/14/2002 Document Revised: 08/08/2013 Document Reviewed: 05/18/2012  ExitCare® Patient Information ©2015 [employer]. This information is not intended to replace advice given to you by your health care provider. Make sure you discuss any questions you have with your health care provider.

## 2014-09-05 NOTE — ED Notes (Signed)
Pt to department for hemodialysis. Denies pain at the time. No signs of acute distress noted.

## 2014-09-06 ENCOUNTER — Encounter (HOSPITAL_COMMUNITY): Payer: Self-pay

## 2014-09-06 ENCOUNTER — Emergency Department (HOSPITAL_COMMUNITY)
Admission: EM | Admit: 2014-09-06 | Discharge: 2014-09-06 | Discharge status: Against Medical Advice | Payer: Medicare Other | Attending: Emergency Medicine | Admitting: Emergency Medicine

## 2014-09-06 DIAGNOSIS — N186 End stage renal disease: Secondary | ICD-10-CM | POA: Insufficient documentation

## 2014-09-06 DIAGNOSIS — Z992 Dependence on renal dialysis: Secondary | ICD-10-CM | POA: Diagnosis not present

## 2014-09-06 DIAGNOSIS — Z4931 Encounter for adequacy testing for hemodialysis: Secondary | ICD-10-CM | POA: Diagnosis present

## 2014-09-06 DIAGNOSIS — Z79899 Other long term (current) drug therapy: Secondary | ICD-10-CM | POA: Diagnosis not present

## 2014-09-06 DIAGNOSIS — Z8739 Personal history of other diseases of the musculoskeletal system and connective tissue: Secondary | ICD-10-CM | POA: Insufficient documentation

## 2014-09-06 DIAGNOSIS — Z720 Tobacco use: Secondary | ICD-10-CM | POA: Insufficient documentation

## 2014-09-06 DIAGNOSIS — F29 Unspecified psychosis not due to a substance or known physiological condition: Secondary | ICD-10-CM | POA: Diagnosis not present

## 2014-09-06 DIAGNOSIS — I120 Hypertensive chronic kidney disease with stage 5 chronic kidney disease or end stage renal disease: Secondary | ICD-10-CM | POA: Insufficient documentation

## 2014-09-06 LAB — I-STAT CHEM 8, ED
BUN: 71 mg/dL — AB (ref 6–20)
CALCIUM ION: 1.12 mmol/L (ref 1.12–1.23)
CHLORIDE: 97 mmol/L — AB (ref 101–111)
CREATININE: 16.7 mg/dL — AB (ref 0.44–1.00)
GLUCOSE: 83 mg/dL (ref 65–99)
HEMATOCRIT: 25 % — AB (ref 36.0–46.0)
HEMOGLOBIN: 8.5 g/dL — AB (ref 12.0–15.0)
POTASSIUM: 4.3 mmol/L (ref 3.5–5.1)
SODIUM: 133 mmol/L — AB (ref 135–145)
TCO2: 19 mmol/L (ref 0–100)

## 2014-09-06 NOTE — ED Notes (Signed)
Patient walked out of the ED AMA.  Patient refused to sign out and refused further care from our ED.
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
## 2014-09-06 NOTE — ED Notes (Signed)
No complaints today, needs dialysis.

## 2014-09-07 ENCOUNTER — Encounter (HOSPITAL_COMMUNITY): Payer: Self-pay | Admitting: Emergency Medicine

## 2014-09-07 ENCOUNTER — Emergency Department (HOSPITAL_COMMUNITY)
Admission: EM | Admit: 2014-09-07 | Discharge: 2014-09-07 | Disposition: A | Discharge status: Home / Self Care | Payer: Medicare Other | Attending: Emergency Medicine | Admitting: Emergency Medicine

## 2014-09-07 DIAGNOSIS — F209 Schizophrenia, unspecified: Secondary | ICD-10-CM | POA: Diagnosis not present

## 2014-09-07 DIAGNOSIS — N186 End stage renal disease: Secondary | ICD-10-CM | POA: Diagnosis not present

## 2014-09-07 DIAGNOSIS — I120 Hypertensive chronic kidney disease with stage 5 chronic kidney disease or end stage renal disease: Secondary | ICD-10-CM | POA: Diagnosis not present

## 2014-09-07 DIAGNOSIS — Z720 Tobacco use: Secondary | ICD-10-CM | POA: Diagnosis not present

## 2014-09-07 DIAGNOSIS — Z8739 Personal history of other diseases of the musculoskeletal system and connective tissue: Secondary | ICD-10-CM | POA: Insufficient documentation

## 2014-09-07 DIAGNOSIS — Z992 Dependence on renal dialysis: Secondary | ICD-10-CM | POA: Insufficient documentation

## 2014-09-07 DIAGNOSIS — F319 Bipolar disorder, unspecified: Secondary | ICD-10-CM | POA: Diagnosis not present

## 2014-09-07 DIAGNOSIS — Z862 Personal history of diseases of the blood and blood-forming organs and certain disorders involving the immune mechanism: Secondary | ICD-10-CM | POA: Diagnosis not present

## 2014-09-07 LAB — CBC
HCT: 21.5 % — ABNORMAL LOW (ref 36.0–46.0)
Hemoglobin: 7 g/dL — ABNORMAL LOW (ref 12.0–15.0)
MCH: 25.5 pg — ABNORMAL LOW (ref 26.0–34.0)
MCHC: 32.6 g/dL (ref 30.0–36.0)
MCV: 78.2 fL (ref 78.0–100.0)
Platelets: 279 10*3/uL (ref 150–400)
RBC: 2.75 MIL/uL — AB (ref 3.87–5.11)
RDW: 19.2 % — ABNORMAL HIGH (ref 11.5–15.5)
WBC: 3.7 10*3/uL — ABNORMAL LOW (ref 4.0–10.5)

## 2014-09-07 LAB — RENAL FUNCTION PANEL
ALBUMIN: 2.7 g/dL — AB (ref 3.5–5.0)
Anion gap: 18 — ABNORMAL HIGH (ref 5–15)
BUN: 88 mg/dL — ABNORMAL HIGH (ref 6–20)
CO2: 20 mmol/L — AB (ref 22–32)
Calcium: 9.1 mg/dL (ref 8.9–10.3)
Chloride: 98 mmol/L — ABNORMAL LOW (ref 101–111)
Creatinine, Ser: 18.08 mg/dL — ABNORMAL HIGH (ref 0.44–1.00)
GFR calc non Af Amer: 2 mL/min — ABNORMAL LOW (ref >60)
GFR, EST AFRICAN AMERICAN: 3 mL/min — AB (ref >60)
Glucose, Bld: 89 mg/dL (ref 65–99)
PHOSPHORUS: 12.5 mg/dL — AB (ref 2.5–4.6)
POTASSIUM: 4.5 mmol/L (ref 3.5–5.1)
Sodium: 136 mmol/L (ref 135–145)

## 2014-09-07 MED ORDER — PENTAFLUOROPROP-TETRAFLUOROETH EX AERO
1.0000 "application " | INHALATION_SPRAY | CUTANEOUS | Status: DC | PRN
  Filled 2014-09-07: qty 30

## 2014-09-07 MED ORDER — SODIUM CHLORIDE 0.9 % IV SOLN: 100.0000 mL | INTRAVENOUS | Status: DC | PRN

## 2014-09-07 MED ORDER — ALTEPLASE 2 MG IJ SOLR: 2.0000 mg | Freq: Once | INTRAMUSCULAR | Status: DC | PRN

## 2014-09-07 MED ORDER — HEPARIN SODIUM (PORCINE) 1000 UNIT/ML DIALYSIS
6000.0000 [IU] | Freq: Once | INTRAMUSCULAR | Status: AC
  Administered 2014-09-07: 6000 [IU] via INTRAVENOUS_CENTRAL

## 2014-09-07 MED ORDER — LIDOCAINE-PRILOCAINE 2.5-2.5 % EX CREA: 1.0000 "application " | TOPICAL_CREAM | CUTANEOUS | Status: DC | PRN

## 2014-09-07 MED ORDER — NEPRO/CARBSTEADY PO LIQD: 237.0000 mL | ORAL | Status: DC | PRN

## 2014-09-07 MED ORDER — ACETAMINOPHEN 325 MG PO TABS
ORAL_TABLET | ORAL | Status: AC
  Administered 2014-09-07: 650 mg
  Filled 2014-09-07: qty 2

## 2014-09-07 MED ORDER — LIDOCAINE HCL (PF) 1 % IJ SOLN: 5.0000 mL | INTRAMUSCULAR | Status: DC | PRN

## 2014-09-07 MED ORDER — HEPARIN SODIUM (PORCINE) 1000 UNIT/ML DIALYSIS
1000.0000 [IU] | INTRAMUSCULAR | Status: DC | PRN
  Filled 2014-09-07: qty 1

## 2014-09-07 NOTE — ED Notes (Signed)
Pt reports that she is here for dialysis and she needs to be evaluated for a bump on the right side of her neck. NAD at this time. Pt alert x4.

## 2014-09-07 NOTE — Progress Notes (Signed)
Pt HD complete with no complications. 5L fluid removed. Pt alert, vss, no complaints. Discharge to home.
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
## 2014-09-07 NOTE — Procedures (Signed)
I was present at this dialysis session, have reviewed the session itself and made  appropriate changes.   [name] returns to ED for dialysis. Is doing good with wt gains and has had very good behavior while on dialysis here lately according to the staff.  4kg over old dry wt of 59 kg but has likely lost weight, goal today 6 kg.  Exam shows puffy face, JVD , clear lungs , normal heart sounds and benign abdomen. No LE edema.  Neuro intact.  AVF patent.   Last orders from Feb 2016 at [facility] > 4 hrs59kg 2K/2CaHeparin 6000AVF R   Kelly Splinter MD (pgr) [phone]    [phone] 09/07/2014, 9:10 AM

## 2014-09-07 NOTE — ED Notes (Signed)
Per MD Linker do not delay dialysis for lab work. Dialysis nurse to draw labs when they arrive.

## 2014-09-11 ENCOUNTER — Encounter (HOSPITAL_COMMUNITY): Payer: Self-pay | Admitting: Emergency Medicine

## 2014-09-11 ENCOUNTER — Non-Acute Institutional Stay (HOSPITAL_COMMUNITY)
Admission: EM | Admit: 2014-09-11 | Discharge: 2014-09-13 | Disposition: A | Discharge status: Home / Self Care | Payer: Medicare Other | Attending: Emergency Medicine | Admitting: Emergency Medicine

## 2014-09-11 ENCOUNTER — Emergency Department (HOSPITAL_COMMUNITY): Payer: Medicare Other

## 2014-09-11 DIAGNOSIS — D649 Anemia, unspecified: Secondary | ICD-10-CM | POA: Insufficient documentation

## 2014-09-11 DIAGNOSIS — N186 End stage renal disease: Secondary | ICD-10-CM | POA: Diagnosis not present

## 2014-09-11 DIAGNOSIS — Z79899 Other long term (current) drug therapy: Secondary | ICD-10-CM | POA: Diagnosis not present

## 2014-09-11 DIAGNOSIS — M329 Systemic lupus erythematosus, unspecified: Secondary | ICD-10-CM | POA: Insufficient documentation

## 2014-09-11 DIAGNOSIS — Z888 Allergy status to other drugs, medicaments and biological substances status: Secondary | ICD-10-CM | POA: Diagnosis not present

## 2014-09-11 DIAGNOSIS — I120 Hypertensive chronic kidney disease with stage 5 chronic kidney disease or end stage renal disease: Secondary | ICD-10-CM | POA: Diagnosis not present

## 2014-09-11 DIAGNOSIS — Z881 Allergy status to other antibiotic agents status: Secondary | ICD-10-CM | POA: Diagnosis not present

## 2014-09-11 DIAGNOSIS — M545 Low back pain: Secondary | ICD-10-CM | POA: Insufficient documentation

## 2014-09-11 DIAGNOSIS — F29 Unspecified psychosis not due to a substance or known physiological condition: Secondary | ICD-10-CM | POA: Insufficient documentation

## 2014-09-11 DIAGNOSIS — R079 Chest pain, unspecified: Secondary | ICD-10-CM | POA: Diagnosis present

## 2014-09-11 DIAGNOSIS — M3214 Glomerular disease in systemic lupus erythematosus: Secondary | ICD-10-CM | POA: Diagnosis not present

## 2014-09-11 DIAGNOSIS — F17210 Nicotine dependence, cigarettes, uncomplicated: Secondary | ICD-10-CM | POA: Diagnosis not present

## 2014-09-11 DIAGNOSIS — F209 Schizophrenia, unspecified: Secondary | ICD-10-CM | POA: Diagnosis not present

## 2014-09-11 DIAGNOSIS — Z992 Dependence on renal dialysis: Secondary | ICD-10-CM

## 2014-09-11 DIAGNOSIS — F319 Bipolar disorder, unspecified: Secondary | ICD-10-CM | POA: Diagnosis not present

## 2014-09-11 DIAGNOSIS — E875 Hyperkalemia: Secondary | ICD-10-CM | POA: Diagnosis present

## 2014-09-11 LAB — CBC WITH DIFFERENTIAL/PLATELET
Basophils Absolute: 0 10*3/uL (ref 0.0–0.1)
Basophils Relative: 0 % (ref 0–1)
EOS PCT: 2 % (ref 0–5)
Eosinophils Absolute: 0.1 10*3/uL (ref 0.0–0.7)
HCT: 22.9 % — ABNORMAL LOW (ref 36.0–46.0)
Hemoglobin: 7.3 g/dL — ABNORMAL LOW (ref 12.0–15.0)
LYMPHS ABS: 0.7 10*3/uL (ref 0.7–4.0)
Lymphocytes Relative: 15 % (ref 12–46)
MCH: 25.2 pg — ABNORMAL LOW (ref 26.0–34.0)
MCHC: 31.9 g/dL (ref 30.0–36.0)
MCV: 79 fL (ref 78.0–100.0)
Monocytes Absolute: 0.5 10*3/uL (ref 0.1–1.0)
Monocytes Relative: 10 % (ref 3–12)
NEUTROS ABS: 3.3 10*3/uL (ref 1.7–7.7)
Neutrophils Relative %: 73 % (ref 43–77)
Platelets: 365 10*3/uL (ref 150–400)
RBC: 2.9 MIL/uL — AB (ref 3.87–5.11)
RDW: 20 % — ABNORMAL HIGH (ref 11.5–15.5)
WBC: 4.6 10*3/uL (ref 4.0–10.5)

## 2014-09-11 LAB — TROPONIN I
TROPONIN I: 0.03 ng/mL (ref <0.031)
Troponin I: 0.04 ng/mL — ABNORMAL HIGH (ref <0.031)

## 2014-09-11 LAB — HCG, QUANTITATIVE, PREGNANCY: HCG, BETA CHAIN, QUANT, S: 10 m[IU]/mL — AB (ref <5)

## 2014-09-11 LAB — COMPREHENSIVE METABOLIC PANEL
ALBUMIN: 2.9 g/dL — AB (ref 3.5–5.0)
ALT: 8 U/L — ABNORMAL LOW (ref 14–54)
AST: 12 U/L — ABNORMAL LOW (ref 15–41)
Alkaline Phosphatase: 56 U/L (ref 38–126)
Anion gap: 19 — ABNORMAL HIGH (ref 5–15)
BUN: 84 mg/dL — AB (ref 6–20)
CO2: 20 mmol/L — AB (ref 22–32)
CREATININE: 17.05 mg/dL — AB (ref 0.44–1.00)
Calcium: 9.3 mg/dL (ref 8.9–10.3)
Chloride: 97 mmol/L — ABNORMAL LOW (ref 101–111)
GFR calc Af Amer: 3 mL/min — ABNORMAL LOW (ref >60)
GFR calc non Af Amer: 2 mL/min — ABNORMAL LOW (ref >60)
Glucose, Bld: 75 mg/dL (ref 65–99)
Potassium: 5.3 mmol/L — ABNORMAL HIGH (ref 3.5–5.1)
Sodium: 136 mmol/L (ref 135–145)
TOTAL PROTEIN: 8.1 g/dL (ref 6.5–8.1)
Total Bilirubin: 0.5 mg/dL (ref 0.3–1.2)

## 2014-09-11 LAB — I-STAT BETA HCG BLOOD, ED (MC, WL, AP ONLY): I-stat hCG, quantitative: 6.3 m[IU]/mL — ABNORMAL HIGH (ref <5)

## 2014-09-11 LAB — BRAIN NATRIURETIC PEPTIDE: B Natriuretic Peptide: 1694.9 pg/mL — ABNORMAL HIGH (ref 0.0–100.0)

## 2014-09-11 LAB — POC URINE PREG, ED: PREG TEST UR: NEGATIVE

## 2014-09-11 MED ORDER — SODIUM CHLORIDE 0.9 % IV SOLN: 100.0000 mL | INTRAVENOUS | Status: DC | PRN

## 2014-09-11 MED ORDER — ALTEPLASE 2 MG IJ SOLR
2.0000 mg | Freq: Once | INTRAMUSCULAR | Status: AC | PRN
  Filled 2014-09-11: qty 2

## 2014-09-11 MED ORDER — ACETAMINOPHEN 325 MG PO TABS
650.0000 mg | ORAL_TABLET | Freq: Once | ORAL | Status: AC
  Administered 2014-09-11: 650 mg via ORAL

## 2014-09-11 MED ORDER — NEPRO/CARBSTEADY PO LIQD
237.0000 mL | ORAL | Status: DC | PRN
  Filled 2014-09-11: qty 237

## 2014-09-11 MED ORDER — PENTAFLUOROPROP-TETRAFLUOROETH EX AERO
1.0000 | INHALATION_SPRAY | CUTANEOUS | Status: DC | PRN
Start: 2014-09-11 — End: 2014-09-13
  Filled 2014-09-11: qty 30

## 2014-09-11 MED ORDER — LIDOCAINE-PRILOCAINE 2.5-2.5 % EX CREA
1.0000 | TOPICAL_CREAM | CUTANEOUS | Status: DC | PRN
Start: 2014-09-11 — End: 2014-09-13
  Filled 2014-09-11: qty 5

## 2014-09-11 MED ORDER — DARBEPOETIN ALFA 200 MCG/0.4ML IJ SOSY
200.0000 ug | PREFILLED_SYRINGE | INTRAMUSCULAR | Status: DC
Start: 2014-09-11 — End: 2014-09-13

## 2014-09-11 MED ORDER — HEPARIN SODIUM (PORCINE) 1000 UNIT/ML DIALYSIS
1000.0000 [IU] | INTRAMUSCULAR | Status: DC | PRN
  Filled 2014-09-11: qty 1

## 2014-09-11 MED ORDER — LIDOCAINE HCL (PF) 1 % IJ SOLN: 5.0000 mL | INTRAMUSCULAR | Status: DC | PRN

## 2014-09-11 MED ORDER — ACETAMINOPHEN 325 MG PO TABS
ORAL_TABLET | ORAL | Status: AC
  Filled 2014-09-11: qty 2

## 2014-09-11 MED ORDER — HEPARIN SODIUM (PORCINE) 1000 UNIT/ML DIALYSIS
20.0000 [IU]/kg | INTRAMUSCULAR | Status: DC | PRN
  Filled 2014-09-11: qty 2

## 2014-09-11 NOTE — ED Notes (Signed)
Pt states she started having chest pain last night on right side of chest. Pt complaining of 9/10 pain. Denies SOB, denies N/V/lightheadedness. Pt states she is also here for dialysis. Pt states she has been coming here for dialysis for 2 months. Last Dialysis was on Thursday of last week. Pt is normally a [name] diaylsis pt.
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
## 2014-09-11 NOTE — Procedures (Signed)
I was present at this session.  I have reviewed the session itself and made appropriate changes.  HD via RUA AVF. Bp^, vol xs. Will keep dialysate temp down and UF 6 liters  Sara [name] L 6/6/20164:11 PM

## 2014-09-11 NOTE — ED Notes (Signed)
Gave report to dialysis 

## 2014-09-13 ENCOUNTER — Emergency Department (HOSPITAL_COMMUNITY)
Admission: EM | Admit: 2014-09-13 | Discharge: 2014-09-13 | Discharge status: Against Medical Advice | Payer: Medicare Other | Attending: Emergency Medicine | Admitting: Emergency Medicine

## 2014-09-13 ENCOUNTER — Emergency Department (HOSPITAL_COMMUNITY): Payer: Medicare Other

## 2014-09-13 ENCOUNTER — Encounter (HOSPITAL_COMMUNITY): Payer: Self-pay | Admitting: *Deleted

## 2014-09-13 DIAGNOSIS — I10 Essential (primary) hypertension: Secondary | ICD-10-CM | POA: Diagnosis not present

## 2014-09-13 DIAGNOSIS — Z720 Tobacco use: Secondary | ICD-10-CM | POA: Insufficient documentation

## 2014-09-13 DIAGNOSIS — R05 Cough: Secondary | ICD-10-CM | POA: Insufficient documentation

## 2014-09-13 DIAGNOSIS — Z992 Dependence on renal dialysis: Secondary | ICD-10-CM | POA: Diagnosis not present

## 2014-09-13 DIAGNOSIS — R079 Chest pain, unspecified: Secondary | ICD-10-CM | POA: Insufficient documentation

## 2014-09-13 LAB — BASIC METABOLIC PANEL
Anion gap: 18 — ABNORMAL HIGH (ref 5–15)
BUN: 60 mg/dL — ABNORMAL HIGH (ref 6–20)
CALCIUM: 9.1 mg/dL (ref 8.9–10.3)
CO2: 21 mmol/L — AB (ref 22–32)
CREATININE: 13.67 mg/dL — AB (ref 0.44–1.00)
Chloride: 94 mmol/L — ABNORMAL LOW (ref 101–111)
GFR calc Af Amer: 4 mL/min — ABNORMAL LOW (ref >60)
GFR calc non Af Amer: 3 mL/min — ABNORMAL LOW (ref >60)
Glucose, Bld: 81 mg/dL (ref 65–99)
POTASSIUM: 4.9 mmol/L (ref 3.5–5.1)
SODIUM: 133 mmol/L — AB (ref 135–145)

## 2014-09-13 LAB — CBC
HEMATOCRIT: 22.6 % — AB (ref 36.0–46.0)
HEMOGLOBIN: 7.3 g/dL — AB (ref 12.0–15.0)
MCH: 25.8 pg — ABNORMAL LOW (ref 26.0–34.0)
MCHC: 32.3 g/dL (ref 30.0–36.0)
MCV: 79.9 fL (ref 78.0–100.0)
Platelets: 353 10*3/uL (ref 150–400)
RBC: 2.83 MIL/uL — AB (ref 3.87–5.11)
RDW: 20.1 % — ABNORMAL HIGH (ref 11.5–15.5)
WBC: 4.9 10*3/uL (ref 4.0–10.5)

## 2014-09-13 LAB — I-STAT TROPONIN, ED: Troponin i, poc: 0.01 ng/mL (ref 0.00–0.08)

## 2014-09-13 NOTE — ED Notes (Signed)
Pt states she did not realize wait was so long and that she is leaving.  Encouraged pt to stay.  Pt declined and states that she will come back in the morning.  Pt stated that it was too cold in the waiting room.  She had been given 3 warm blankets.  Offered pt a new warm blanket and she declined.

## 2014-09-13 NOTE — ED Notes (Signed)
Pt reports needing dialysis, last treatment was Monday. Also reports having chest pains recently, hx of pneumonia and had productive cough this am. Airway intact, ekg done.

## 2014-09-14 ENCOUNTER — Encounter (HOSPITAL_COMMUNITY): Payer: Self-pay

## 2014-09-14 ENCOUNTER — Emergency Department (HOSPITAL_COMMUNITY)
Admission: EM | Admit: 2014-09-14 | Discharge: 2014-09-14 | Disposition: A | Discharge status: Home / Self Care | Payer: Medicare Other | Attending: Emergency Medicine | Admitting: Emergency Medicine

## 2014-09-14 DIAGNOSIS — N186 End stage renal disease: Secondary | ICD-10-CM | POA: Diagnosis not present

## 2014-09-14 DIAGNOSIS — M545 Low back pain: Secondary | ICD-10-CM | POA: Insufficient documentation

## 2014-09-14 DIAGNOSIS — Z79899 Other long term (current) drug therapy: Secondary | ICD-10-CM | POA: Diagnosis not present

## 2014-09-14 DIAGNOSIS — Z720 Tobacco use: Secondary | ICD-10-CM | POA: Diagnosis not present

## 2014-09-14 DIAGNOSIS — Z992 Dependence on renal dialysis: Secondary | ICD-10-CM | POA: Insufficient documentation

## 2014-09-14 LAB — IRON AND TIBC
Iron: 8 ug/dL — ABNORMAL LOW (ref 28–170)
SATURATION RATIOS: 5 % — AB (ref 10.4–31.8)
TIBC: 174 ug/dL — AB (ref 250–450)
UIBC: 166 ug/dL

## 2014-09-14 LAB — FERRITIN: FERRITIN: 428 ng/mL — AB (ref 11–307)

## 2014-09-14 MED ORDER — HYDROCODONE-ACETAMINOPHEN 5-325 MG PO TABS
1.0000 | ORAL_TABLET | Freq: Once | ORAL | Status: AC
  Administered 2014-09-14: 1 via ORAL
  Filled 2014-09-14: qty 1

## 2014-09-14 MED ORDER — DARBEPOETIN ALFA 100 MCG/0.5ML IJ SOSY
100.0000 ug | PREFILLED_SYRINGE | INTRAMUSCULAR | Status: DC
  Administered 2014-09-14: 100 ug via INTRAVENOUS

## 2014-09-14 MED ORDER — DARBEPOETIN ALFA 100 MCG/0.5ML IJ SOSY
PREFILLED_SYRINGE | INTRAMUSCULAR | Status: AC
  Administered 2014-09-14: 100 ug via INTRAVENOUS
  Filled 2014-09-14: qty 0.5

## 2014-09-14 NOTE — ED Notes (Signed)
Pt came to ED for chest pain and dialysis yesterday but left because she didn't want to wait in waiting room. Pt denies CP at this time. Pt c/o back pain. Pt wants dialysis. Last dialysis was Monday 6/6. Pt still makes urine.

## 2014-09-14 NOTE — ED Provider Notes (Signed)
CSN: [id number]     Arrival date & time 09/14/14  4259 History   First MD Initiated Contact with Patient 09/14/14 0827     Chief Complaint  Patient presents with  . Vascular Access Problem     (Consider location/radiation/quality/duration/timing/severity/associated sxs/prior Treatment) HPI This is a 34 year old female who has end-stage renal disease. She currently does not have a dialysis center. The patient is using the hospital for dialysis. She is having back spasms intermittently. She currently rates her pain at 7 out of 10. Worse with movement. Unrelieved with Advil this morning.Denies weakness, loss of bowel/bladder function or saddle anesthesia. Denies neck stiffness, headache, rash.  Denies fever or recent procedures to back. She denies chest pain, shortness of breath.  Past Medical History  Diagnosis Date  . Renal disorder    History reviewed. No pertinent past surgical history. History reviewed. No pertinent family history. History  Substance Use Topics  . Smoking status: Current Every Day Smoker -- 1.00 packs/day    Types: Cigarettes  . Smokeless tobacco: Not on file  . Alcohol Use: No   OB History    No data available     Review of Systems Ten systems reviewed and are negative for acute change, except as noted in the HPI.     Allergies  Ativan; Geodon; Keflex; and Other  Home Medications   Prior to Admission medications   Medication Sig Start Date End Date Taking? Authorizing Provider  acetaminophen (TYLENOL) 500 MG tablet Take 500 mg by mouth every 6 (six) hours as needed for mild pain.    Historical Provider, MD  calcium acetate (PHOSLO) 667 MG capsule Take 667 mg by mouth 3 (three) times daily with meals.    Historical Provider, MD  carvedilol (COREG) 6.25 MG tablet Take 6.25 mg by mouth 2 (two) times daily with a meal.    Historical Provider, MD  colchicine 0.6 MG tablet Take 0.6 mg by mouth every other day.    Historical Provider, MD  haloperidol (HALDOL)  5 MG tablet Take 5 mg by mouth 2 (two) times daily.    Historical Provider, MD  hydrALAZINE (APRESOLINE) 25 MG tablet Take 25 mg by mouth 3 (three) times daily.    Historical Provider, MD  ibuprofen (ADVIL,MOTRIN) 200 MG tablet Take 400 mg by mouth every 8 (eight) hours as needed for mild pain.    Historical Provider, MD  isosorbide mononitrate (IMDUR) 30 MG 24 hr tablet Take 30 mg by mouth daily. 05/25/14   Historical Provider, MD   There were no vitals taken for this visit. Physical Exam  Constitutional: She is oriented to person, place, and time. She appears well-developed and well-nourished. No distress.  Chronically ill-appearing  HENT:  Head: Normocephalic and atraumatic.  Eyes: Conjunctivae are normal. No scleral icterus.  Neck: Normal range of motion.  Cardiovascular: Normal rate, regular rhythm and normal heart sounds.  Exam reveals no gallop and no friction rub.   No murmur heard. Pulmonary/Chest: Effort normal and breath sounds normal. No respiratory distress.  Abdominal: Soft. Bowel sounds are normal. She exhibits no distension and no mass. There is no tenderness. There is no guarding.  Neurological: She is alert and oriented to person, place, and time.  Skin: Skin is warm and dry. She is not diaphoretic.  Nursing note and vitals reviewed.   ED Course  Procedures (including critical care time) Labs Review Labs Reviewed - No data to display  Imaging Review Dg Chest 2 View  09/13/2014  CLINICAL DATA:  Chest pain and short of breath for 1 day  EXAM: CHEST  2 VIEW  COMPARISON:  None.  FINDINGS: Marked cardiomegaly. Bilateral pleural effusions. Bibasilar subsegmental atelectasis right greater than left. Normal vascularity. Central basilar edema cannot be excluded.  IMPRESSION: Cardiomegaly and bilateral pleural effusions as described. Central basilar edema cannot be excluded.   Electronically Signed   By: Marybelle Killings M.D.   On: 09/13/2014 15:23     EKG Interpretation None       MDM   Final diagnoses:  None  There were no vitals taken for this visit.  Patient in need of dialysis.  Dr. Clois Dupes will admit for dialysis.   Patient with back pain.  No neurological deficits and normal neuro exam.  Patient can walk but states is painful.  No loss of bowel or bladder control.  No concern for cauda equina.  No fever, night sweats, weight loss, h/o cancer, IVDU.  RICE protocol and pain medicine indicated and discussed with patient.      Margarita Mail, PA-C 09/14/14 [place], MD 09/15/14 2221

## 2014-09-14 NOTE — Progress Notes (Signed)
Hemodialysis- Pt signed off AMA with 1 hour 28 minutes remaining. Form placed in chart. UF=3L out of 5L goal. Pt has no complaints. Discharged from HD department in stable condition on foot.

## 2014-09-15 LAB — HEPATITIS B SURFACE ANTIGEN: HEP B S AG: NEGATIVE

## 2014-09-15 LAB — PARATHYROID HORMONE, INTACT (NO CA): PTH: 880 pg/mL — ABNORMAL HIGH (ref 15–65)

## 2014-09-16 ENCOUNTER — Emergency Department (HOSPITAL_COMMUNITY): Payer: Medicare Other

## 2014-09-16 ENCOUNTER — Ambulatory Visit (HOSPITAL_COMMUNITY)
Admission: EM | Admit: 2014-09-16 | Discharge: 2014-09-16 | Payer: Medicare Other | Attending: Emergency Medicine | Admitting: Emergency Medicine

## 2014-09-16 ENCOUNTER — Encounter (HOSPITAL_COMMUNITY): Payer: Self-pay

## 2014-09-16 DIAGNOSIS — I120 Hypertensive chronic kidney disease with stage 5 chronic kidney disease or end stage renal disease: Secondary | ICD-10-CM | POA: Diagnosis not present

## 2014-09-16 DIAGNOSIS — M329 Systemic lupus erythematosus, unspecified: Secondary | ICD-10-CM | POA: Insufficient documentation

## 2014-09-16 DIAGNOSIS — D539 Nutritional anemia, unspecified: Secondary | ICD-10-CM | POA: Insufficient documentation

## 2014-09-16 DIAGNOSIS — Z992 Dependence on renal dialysis: Secondary | ICD-10-CM | POA: Insufficient documentation

## 2014-09-16 DIAGNOSIS — Z79899 Other long term (current) drug therapy: Secondary | ICD-10-CM | POA: Insufficient documentation

## 2014-09-16 DIAGNOSIS — F209 Schizophrenia, unspecified: Secondary | ICD-10-CM | POA: Diagnosis not present

## 2014-09-16 DIAGNOSIS — Z791 Long term (current) use of non-steroidal anti-inflammatories (NSAID): Secondary | ICD-10-CM | POA: Diagnosis not present

## 2014-09-16 DIAGNOSIS — R079 Chest pain, unspecified: Secondary | ICD-10-CM | POA: Diagnosis not present

## 2014-09-16 DIAGNOSIS — N186 End stage renal disease: Secondary | ICD-10-CM | POA: Diagnosis not present

## 2014-09-16 DIAGNOSIS — F17210 Nicotine dependence, cigarettes, uncomplicated: Secondary | ICD-10-CM | POA: Insufficient documentation

## 2014-09-16 DIAGNOSIS — F319 Bipolar disorder, unspecified: Secondary | ICD-10-CM | POA: Insufficient documentation

## 2014-09-16 LAB — CBC
HCT: 22.7 % — ABNORMAL LOW (ref 36.0–46.0)
Hemoglobin: 7.2 g/dL — ABNORMAL LOW (ref 12.0–15.0)
MCH: 24.7 pg — ABNORMAL LOW (ref 26.0–34.0)
MCHC: 31.7 g/dL (ref 30.0–36.0)
MCV: 78 fL (ref 78.0–100.0)
Platelets: 362 10*3/uL (ref 150–400)
RBC: 2.91 MIL/uL — ABNORMAL LOW (ref 3.87–5.11)
RDW: 20.6 % — AB (ref 11.5–15.5)
WBC: 5.1 10*3/uL (ref 4.0–10.5)

## 2014-09-16 LAB — BASIC METABOLIC PANEL
ANION GAP: 17 — AB (ref 5–15)
BUN: 49 mg/dL — ABNORMAL HIGH (ref 6–20)
CALCIUM: 9.4 mg/dL (ref 8.9–10.3)
CO2: 23 mmol/L (ref 22–32)
CREATININE: 12.98 mg/dL — AB (ref 0.44–1.00)
Chloride: 93 mmol/L — ABNORMAL LOW (ref 101–111)
GFR calc Af Amer: 4 mL/min — ABNORMAL LOW (ref >60)
GFR calc non Af Amer: 3 mL/min — ABNORMAL LOW (ref >60)
GLUCOSE: 91 mg/dL (ref 65–99)
Potassium: 4.7 mmol/L (ref 3.5–5.1)
Sodium: 133 mmol/L — ABNORMAL LOW (ref 135–145)

## 2014-09-16 LAB — TROPONIN I
TROPONIN I: 0.04 ng/mL — AB (ref <0.031)
Troponin I: 0.03 ng/mL (ref <0.031)

## 2014-09-16 MED ORDER — ASPIRIN 81 MG PO CHEW
324.0000 mg | CHEWABLE_TABLET | Freq: Once | ORAL | Status: AC
  Administered 2014-09-16: 324 mg via ORAL
  Filled 2014-09-16: qty 4

## 2014-09-16 MED ORDER — FENTANYL CITRATE (PF) 100 MCG/2ML IJ SOLN
25.0000 ug | Freq: Once | INTRAMUSCULAR | Status: AC
  Administered 2014-09-16: 25 ug via INTRAVENOUS
  Filled 2014-09-16: qty 2

## 2014-09-16 MED ORDER — DOXERCALCIFEROL 4 MCG/2ML IV SOLN
2.0000 ug | INTRAVENOUS | Status: DC
  Administered 2014-09-16: 2 ug via INTRAVENOUS

## 2014-09-16 MED ORDER — SODIUM CHLORIDE 0.9 % IV SOLN
510.0000 mg | INTRAVENOUS | Status: DC
  Filled 2014-09-16: qty 17

## 2014-09-16 MED ORDER — ACETAMINOPHEN 325 MG PO TABS
ORAL_TABLET | ORAL | Status: AC
  Administered 2014-09-16: 650 mg via ORAL
  Filled 2014-09-16: qty 2

## 2014-09-16 MED ORDER — DOXERCALCIFEROL 4 MCG/2ML IV SOLN
INTRAVENOUS | Status: AC
  Administered 2014-09-16: 2 ug via INTRAVENOUS
  Filled 2014-09-16: qty 2

## 2014-09-16 MED ORDER — ACETAMINOPHEN 325 MG PO TABS
650.0000 mg | ORAL_TABLET | Freq: Once | ORAL | Status: AC
  Administered 2014-09-16: 650 mg via ORAL

## 2014-09-16 NOTE — ED Provider Notes (Signed)
CSN: [id number]     Arrival date & time 09/16/14  0736 History   First MD Initiated Contact with Patient 09/16/14 [phone]     Chief Complaint  Patient presents with  . Vascular Access Problem     (Consider location/radiation/quality/duration/timing/severity/associated sxs/prior Treatment) HPI Comments: Sara [name] is a 34 y.o. female with a PMHx of HTN, SLE, bipolar 1, schizophrenia, ESRD on dialysis M/W/F (no dialysis center, comes to ED for dialysis), chronic anemia, and chronic low back pain, who presents to the ED with complaints of needing dialysis, as well as chest pain that began yesterday while at rest. She states her last dialysis was on Thursday 6/9, typically she comes Monday Wednesday and Friday, but this week was changed due to her becoming incarcerated. She states she had no issues at her last dialysis. She endorses gradual onset chest pain that began at rest yesterday morning when she awoke, describing it is 8/10 left-sided pulling, radiating into her left neck, coming intermittently, worse with movement and breathing, and improved with Advil. She was admitted for similar complaints on 08/21/14, and left AMA. At that time she had an echo which revealed EF of 40-45% and small persisting pericardial effusion. She has chronically elevated troponins. She does not follow up with cardiology regularly. She states she took all of her medications this morning, and she endorses a smoking history. She denies any fevers, chills, lightheadedness, dizziness, diaphoresis, cough, shortness of breath, orthopnea, PND, claudication, wheezing, abdominal pain, nausea, vomiting, diarrhea, constipation, dysuria, hematuria, numbness, tingling, weakness, recent surgery/travel/immobilization, history of DVT/PE, estrogen use, or leg swelling. She denies any family history of cardiac disease.  Patient is a 35 y.o. female presenting with chest pain. The history is provided by the patient. No language interpreter was  used.  Chest Pain Pain location:  L chest Pain quality comment:  Pulling Pain radiates to:  Neck Pain radiates to the back: no   Pain severity:  Moderate Onset quality:  Gradual Duration:  1 day Timing:  Intermittent Progression:  Unchanged Chronicity:  Recurrent Context: at rest   Relieved by: advil. Worsened by:  Movement (breathing and movement) Ineffective treatments:  None tried Associated symptoms: no abdominal pain, no back pain, no claudication, no cough, no diaphoresis, no dizziness, no fever, no lower extremity edema, no nausea, no near-syncope, no numbness, no orthopnea, no PND, no shortness of breath, no syncope, not vomiting and no weakness   Risk factors: hypertension and smoking   Risk factors: no birth control, no coronary artery disease, no diabetes mellitus, no high cholesterol, no immobilization, no prior DVT/PE and no surgery     Past Medical History  Diagnosis Date  . Renal disorder   . Lupus   . Hypertension    Past Surgical History  Procedure Laterality Date  . Arteriovenous graft placement     History reviewed. No pertinent family history. History  Substance Use Topics  . Smoking status: Current Every Day Smoker -- 1.00 packs/day    Types: Cigarettes  . Smokeless tobacco: Not on file  . Alcohol Use: No   OB History    No data available     Review of Systems  Constitutional: Negative for fever, chills and diaphoresis.  Respiratory: Negative for cough, shortness of breath and wheezing.   Cardiovascular: Positive for chest pain. Negative for orthopnea, claudication, leg swelling, syncope, PND and near-syncope.  Gastrointestinal: Negative for nausea, vomiting and abdominal pain.  Genitourinary: Negative for dysuria, hematuria, vaginal bleeding and vaginal discharge.  Musculoskeletal:  Positive for neck pain (radiating from chest). Negative for myalgias, back pain and arthralgias.  Skin: Negative for color change.  Neurological: Negative for  dizziness, weakness, light-headedness and numbness.  Psychiatric/Behavioral: Negative for confusion.   10 Systems reviewed and are negative for acute change except as noted in the HPI.    Allergies  Ativan; Geodon; Keflex; and Other  Home Medications   Prior to Admission medications   Medication Sig Start Date End Date Taking? Authorizing Provider  acetaminophen (TYLENOL) 500 MG tablet Take 500 mg by mouth every 6 (six) hours as needed for mild pain.    Historical Provider, MD  calcium acetate (PHOSLO) 667 MG capsule Take 667 mg by mouth 3 (three) times daily with meals.    Historical Provider, MD  colchicine 0.6 MG tablet Take 0.6 mg by mouth every other day.    Historical Provider, MD  haloperidol (HALDOL) 5 MG tablet Take 5 mg by mouth 2 (two) times daily.    Historical Provider, MD  hydrALAZINE (APRESOLINE) 25 MG tablet Take 25 mg by mouth 3 (three) times daily.    Historical Provider, MD  ibuprofen (ADVIL,MOTRIN) 200 MG tablet Take 400 mg by mouth every 8 (eight) hours as needed for mild pain.    Historical Provider, MD  isosorbide mononitrate (IMDUR) 30 MG 24 hr tablet Take 30 mg by mouth daily. 05/25/14   Historical Provider, MD  metoprolol tartrate (LOPRESSOR) 25 MG tablet Take 25 mg by mouth 2 (two) times daily.    Historical Provider, MD   BP 134/92 mmHg  HR 95  Temp(Src) 97.7 F (36.5 C) (Oral)  Resp 21  SpO2 99% Physical Exam  Constitutional: She is oriented to person, place, and time. Vital signs are normal. She appears well-developed and well-nourished.  Non-toxic appearance. No distress.  Afebrile, nontoxic, NAD. In shackles.  HENT:  Head: Normocephalic and atraumatic.  Mouth/Throat: Oropharynx is clear and moist and mucous membranes are normal.  Eyes: Conjunctivae and EOM are normal. Right eye exhibits no discharge. Left eye exhibits no discharge.  Neck: Normal range of motion. Neck supple. No JVD present.  No JVD  Cardiovascular: Normal rate, regular rhythm,  normal heart sounds and intact distal pulses.  Exam reveals no gallop and no friction rub.   No murmur heard. RRR, nl s1/s2, no m/r/g, distal pulses intact, no pedal edema  RUE AV fistula with palpable thrill  Pulmonary/Chest: Effort normal and breath sounds normal. No respiratory distress. She has no decreased breath sounds. She has no wheezes. She has no rhonchi. She has no rales. She exhibits no tenderness, no crepitus, no deformity and no retraction.  CTAB in all lung fields, no w/r/r, no hypoxia or increased WOB, speaking in full sentences, SpO2 99% on RA  No chest wall tenderness, crepitus, deformity, or retractions  Abdominal: Soft. Normal appearance and bowel sounds are normal. She exhibits no distension. There is no tenderness. There is no rigidity, no rebound, no guarding, no CVA tenderness, no tenderness at McBurney's point and negative Murphy's sign.  Musculoskeletal: Normal range of motion.  MAE x4 Strength and sensation grossly intact Distal pulses intact No pedal edema, neg homan's bilaterally   Neurological: She is alert and oriented to person, place, and time. She has normal strength. No sensory deficit.  Skin: Skin is warm, dry and intact. No rash noted.  Psychiatric: She has a normal mood and affect.  Nursing note and vitals reviewed.   ED Course  Procedures (including critical care time) Labs Review  Labs Reviewed  BASIC METABOLIC PANEL - Abnormal; Notable for the following:    Sodium 133 (*)    Chloride 93 (*)    BUN 49 (*)    Creatinine, Ser 12.98 (*)    GFR calc non Af Amer 3 (*)    GFR calc Af Amer 4 (*)    Anion gap 17 (*)    All other components within normal limits  CBC - Abnormal; Notable for the following:    RBC 2.91 (*)    Hemoglobin 7.2 (*)    HCT 22.7 (*)    MCH 24.7 (*)    RDW 20.6 (*)    All other components within normal limits  TROPONIN I - Abnormal; Notable for the following:    Troponin I 0.04 (*)    All other components within normal  limits  TROPONIN I    Imaging Review Dg Chest 2 View  09/16/2014   CLINICAL DATA:  Chest pain beginning yesterday. On dialysis. Vascular access problem.  EXAM: CHEST  2 VIEW  COMPARISON:  09/13/2014  FINDINGS: The cardiac silhouette remains markedly enlarged, unchanged. Small bilateral pleural effusions are similar to the prior study. Bibasilar subsegmental atelectasis is similar to the prior study. No overt pulmonary edema, lobar consolidation, or pneumothorax is identified. No acute osseous abnormality is seen.  IMPRESSION: Small bilateral pleural effusions with bibasilar atelectasis.   Electronically Signed   By: Logan Bores   On: 09/16/2014 09:37     EKG Interpretation   Date/Time:  Saturday September 16 2014 07:55:24 EDT Ventricular Rate:  97 PR Interval:  138 QRS Duration: 88 QT Interval:  374 QTC Calculation: 475 R Axis:   66 Text Interpretation:  Sinus rhythm Probable left ventricular hypertrophy  Borderline prolonged QT interval rate improved compared to prior Confirmed  by [facility]  MD, TREY ([id number]) on 09/16/2014 9:27:51 AM      MDM   Final diagnoses:  Chest pain at rest  ESRD (end stage renal disease) on dialysis    34 y.o. female here for dialysis. Also states she has "some chest pain" which has been ongoing x1 day, intermittent, worse with movement. No reproducibility. No tachycardia or hypoxia, no LE swelling, doubt DVT. Pt had the same description of her CP last month on 08/21/14 when I saw her. She appears to be in no distress. Has a chronically elevated troponin. Last hospitalization she was ruled out with a no increasing troponins and echo showed EF to 40-45% and some wall motion abnormalities, persistent small pericardial effusion, but left AMA before she was treated for the possible PNA found on CXR at that time. She doesn't f/up with cardiologist regularly. Will get labs and CXR. EKG unchanged. Will give ASA and fentanyl and reassess.  10:39 AM CP completely resolved  after ASA and fentanyl. BMP showing chronically elevated BUN and Cr as well as anion gap. CBC showing chronic anemia. Trop I 0.04, same as visit on 08/21/14, likely from her chronic kidney disease. CXR showing stable b/l pleural effusions. Will plan for repeat trop at 3hr mark, if unchanged then will proceed with dialysis and outpatient management of her chronic CP and chronic intermittently elevated troponins.   11:14 AM Care signed over to [facility], who will f/up with second troponin, if normal or not increased from 0.04, then plan for dialysis and discharge; if elevated above 0.04 then admit. Please see her note for further documentation of care.  Zacarias Pontes, PA-C 09/16/14 [place],  MD 09/18/14 1948

## 2014-09-16 NOTE — ED Provider Notes (Signed)
Discussed case in great detail with Mercedes Camprubi-Soms, PA-C. Transfer of care to this provider at change in shift.   Sara [name] is a 34 year old female of renal disorder, lupus, hypertension, end-stage renal disease on dialysis Monday/Wednesday/Friday presenting to the ED with chest pain. Patient is also here for dialysis. Last dialysis session was 3 days ago.  Plan: Second troponin, if elevated admitted to family medicine. If unremarkable, consult nephrology for dialysis.  Results for orders placed or performed during the hospital encounter of 33/29/51  Basic metabolic panel  Result Value Ref Range   Sodium 133 (L) 135 - 145 mmol/L   Potassium 4.7 3.5 - 5.1 mmol/L   Chloride 93 (L) 101 - 111 mmol/L   CO2 23 22 - 32 mmol/L   Glucose, Bld 91 65 - 99 mg/dL   BUN 49 (H) 6 - 20 mg/dL   Creatinine, Ser 12.98 (H) 0.44 - 1.00 mg/dL   Calcium 9.4 8.9 - 10.3 mg/dL   GFR calc non Af Amer 3 (L) >60 mL/min   GFR calc Af Amer 4 (L) >60 mL/min   Anion gap 17 (H) 5 - 15  CBC  Result Value Ref Range   WBC 5.1 4.0 - 10.5 K/uL   RBC 2.91 (L) 3.87 - 5.11 MIL/uL   Hemoglobin 7.2 (L) 12.0 - 15.0 g/dL   HCT 22.7 (L) 36.0 - 46.0 %   MCV 78.0 78.0 - 100.0 fL   MCH 24.7 (L) 26.0 - 34.0 pg   MCHC 31.7 30.0 - 36.0 g/dL   RDW 20.6 (H) 11.5 - 15.5 %   Platelets 362 150 - 400 K/uL  Troponin I  Result Value Ref Range   Troponin I 0.04 (H) <0.031 ng/mL   Dg Chest 2 View  09/16/2014   CLINICAL DATA:  Chest pain beginning yesterday. On dialysis. Vascular access problem.  EXAM: CHEST  2 VIEW  COMPARISON:  09/13/2014  FINDINGS: The cardiac silhouette remains markedly enlarged, unchanged. Small bilateral pleural effusions are similar to the prior study. Bibasilar subsegmental atelectasis is similar to the prior study. No overt pulmonary edema, lobar consolidation, or pneumothorax is identified. No acute osseous abnormality is seen.  IMPRESSION: Small bilateral pleural effusions with bibasilar atelectasis.    Electronically Signed   By: Logan Bores   On: 09/16/2014 09:37   Dg Chest 2 View  09/13/2014   CLINICAL DATA:  Chest pain and short of breath for 1 day  EXAM: CHEST  2 VIEW  COMPARISON:  None.  FINDINGS: Marked cardiomegaly. Bilateral pleural effusions. Bibasilar subsegmental atelectasis right greater than left. Normal vascularity. Central basilar edema cannot be excluded.  IMPRESSION: Cardiomegaly and bilateral pleural effusions as described. Central basilar edema cannot be excluded.   Electronically Signed   By: Marybelle Killings M.D.   On: 09/13/2014 15:23   EKG unchanged. Troponin mildly elevated at 0.04. Second troponin 0.03 CBC negative elevated leukocytosis. Hemoglobin 7.2, hematocrit 22.7 - unchanged when compared to previous labs, this appears to be patient's baseline. BMP noted hyponatremia 133. Elevated BUN of 49, creatinine of 12.98 with elevated anion gap of 17. Glucose of 91. Chest x-ray noted small bilateral pleural effusions with bibasilar atelectasis-again noted.  Medications  aspirin chewable tablet 324 mg (324 mg Oral Given 09/16/14 0912)  fentaNYL (SUBLIMAZE) injection 25 mcg (25 mcg Intravenous Given 09/16/14 0911)    Filed Vitals:   09/16/14 0945 09/16/14 1015 09/16/14 1045 09/16/14 1100  BP: 150/95 148/101 156/93 147/99  Pulse: 97 106 102 111  Temp:      TempSrc:      Resp: 26 23 24 29   SpO2: 100% 96% 98% 100%   Troponin has decreased while in ED setting. Attending physician, Dr. Doy Mince, son assessed patient-patient denied chest pain or shortness of breath. As per attending physician, reported that patient can go to dialysis. Patient brought to dialysis.  Jamse Mead, PA-C 09/16/14 1500  Serita Grit, MD 09/18/14 1944

## 2014-09-16 NOTE — Progress Notes (Signed)
Pt completed HD tx with no complications. Signed off AMA 45 minutes early. Refused medications except for Hectorol. Discharged after HD to jail. In police custody. Alert, no c/o, bp high. Other vss.

## 2014-09-16 NOTE — ED Notes (Signed)
Pt is in handcuffs and shackles per [employer] protocol. 2 officers at bedside.

## 2014-09-16 NOTE — ED Notes (Signed)
Pt in police custody

## 2014-09-16 NOTE — ED Notes (Signed)
Pt coming in with [facility] office in custody. She is here for dialysis. Pt is a [name] dialysis patient.

## 2014-09-19 ENCOUNTER — Non-Acute Institutional Stay (HOSPITAL_COMMUNITY)
Admission: EM | Admit: 2014-09-19 | Discharge: 2014-09-19 | Disposition: A | Discharge status: Home / Self Care | Payer: Medicare Other | Attending: Emergency Medicine | Admitting: Emergency Medicine

## 2014-09-19 ENCOUNTER — Encounter (HOSPITAL_COMMUNITY): Payer: Self-pay | Admitting: *Deleted

## 2014-09-19 DIAGNOSIS — Z992 Dependence on renal dialysis: Secondary | ICD-10-CM | POA: Insufficient documentation

## 2014-09-19 DIAGNOSIS — N186 End stage renal disease: Secondary | ICD-10-CM

## 2014-09-19 DIAGNOSIS — M3214 Glomerular disease in systemic lupus erythematosus: Secondary | ICD-10-CM | POA: Insufficient documentation

## 2014-09-19 DIAGNOSIS — M545 Low back pain: Secondary | ICD-10-CM | POA: Insufficient documentation

## 2014-09-19 DIAGNOSIS — Z888 Allergy status to other drugs, medicaments and biological substances status: Secondary | ICD-10-CM | POA: Insufficient documentation

## 2014-09-19 DIAGNOSIS — F209 Schizophrenia, unspecified: Secondary | ICD-10-CM | POA: Insufficient documentation

## 2014-09-19 DIAGNOSIS — D649 Anemia, unspecified: Secondary | ICD-10-CM | POA: Insufficient documentation

## 2014-09-19 DIAGNOSIS — Z881 Allergy status to other antibiotic agents status: Secondary | ICD-10-CM | POA: Diagnosis not present

## 2014-09-19 DIAGNOSIS — F319 Bipolar disorder, unspecified: Secondary | ICD-10-CM | POA: Diagnosis not present

## 2014-09-19 DIAGNOSIS — Z79899 Other long term (current) drug therapy: Secondary | ICD-10-CM | POA: Diagnosis not present

## 2014-09-19 DIAGNOSIS — F17210 Nicotine dependence, cigarettes, uncomplicated: Secondary | ICD-10-CM | POA: Insufficient documentation

## 2014-09-19 DIAGNOSIS — I120 Hypertensive chronic kidney disease with stage 5 chronic kidney disease or end stage renal disease: Secondary | ICD-10-CM | POA: Insufficient documentation

## 2014-09-19 LAB — RENAL FUNCTION PANEL
ALBUMIN: 2.3 g/dL — AB (ref 3.5–5.0)
ANION GAP: 14 (ref 5–15)
BUN: 38 mg/dL — ABNORMAL HIGH (ref 6–20)
CO2: 24 mmol/L (ref 22–32)
CREATININE: 10.11 mg/dL — AB (ref 0.44–1.00)
Calcium: 8.6 mg/dL — ABNORMAL LOW (ref 8.9–10.3)
Chloride: 96 mmol/L — ABNORMAL LOW (ref 101–111)
GFR calc Af Amer: 5 mL/min — ABNORMAL LOW (ref >60)
GFR, EST NON AFRICAN AMERICAN: 4 mL/min — AB (ref >60)
Glucose, Bld: 93 mg/dL (ref 65–99)
Phosphorus: 6.1 mg/dL — ABNORMAL HIGH (ref 2.5–4.6)
Potassium: 4.2 mmol/L (ref 3.5–5.1)
SODIUM: 134 mmol/L — AB (ref 135–145)

## 2014-09-19 LAB — BASIC METABOLIC PANEL
ANION GAP: 15 (ref 5–15)
BUN: 47 mg/dL — ABNORMAL HIGH (ref 6–20)
CO2: 25 mmol/L (ref 22–32)
Calcium: 9.7 mg/dL (ref 8.9–10.3)
Chloride: 97 mmol/L — ABNORMAL LOW (ref 101–111)
Creatinine, Ser: 13.13 mg/dL — ABNORMAL HIGH (ref 0.44–1.00)
GFR calc Af Amer: 4 mL/min — ABNORMAL LOW (ref >60)
GFR, EST NON AFRICAN AMERICAN: 3 mL/min — AB (ref >60)
GLUCOSE: 95 mg/dL (ref 65–99)
Potassium: 4.5 mmol/L (ref 3.5–5.1)
SODIUM: 137 mmol/L (ref 135–145)

## 2014-09-19 LAB — CBC
HEMATOCRIT: 17.5 % — AB (ref 36.0–46.0)
HEMOGLOBIN: 5.6 g/dL — AB (ref 12.0–15.0)
MCH: 24.2 pg — ABNORMAL LOW (ref 26.0–34.0)
MCHC: 32 g/dL (ref 30.0–36.0)
MCV: 75.8 fL — ABNORMAL LOW (ref 78.0–100.0)
Platelets: 447 10*3/uL — ABNORMAL HIGH (ref 150–400)
RBC: 2.31 MIL/uL — AB (ref 3.87–5.11)
RDW: 21.4 % — ABNORMAL HIGH (ref 11.5–15.5)
WBC: 5.6 10*3/uL (ref 4.0–10.5)

## 2014-09-19 LAB — PREPARE RBC (CROSSMATCH)

## 2014-09-19 MED ORDER — HEPARIN SODIUM (PORCINE) 1000 UNIT/ML DIALYSIS: 20.0000 [IU]/kg | Freq: Once | INTRAMUSCULAR | Status: DC

## 2014-09-19 MED ORDER — PENTAFLUOROPROP-TETRAFLUOROETH EX AERO
1.0000 "application " | INHALATION_SPRAY | CUTANEOUS | Status: DC | PRN
  Administered 2014-09-19: 1 via TOPICAL

## 2014-09-19 MED ORDER — NEPRO/CARBSTEADY PO LIQD: 237.0000 mL | ORAL | Status: DC | PRN

## 2014-09-19 MED ORDER — DARBEPOETIN ALFA 200 MCG/0.4ML IJ SOSY
200.0000 ug | PREFILLED_SYRINGE | Freq: Once | INTRAMUSCULAR | Status: AC
  Administered 2014-09-19: 200 ug via INTRAVENOUS

## 2014-09-19 MED ORDER — LIDOCAINE-PRILOCAINE 2.5-2.5 % EX CREA
1.0000 "application " | TOPICAL_CREAM | CUTANEOUS | Status: DC | PRN
  Filled 2014-09-19: qty 5

## 2014-09-19 MED ORDER — LIDOCAINE HCL (PF) 1 % IJ SOLN: 5.0000 mL | INTRAMUSCULAR | Status: DC | PRN

## 2014-09-19 MED ORDER — SODIUM CHLORIDE 0.9 % IV SOLN: 100.0000 mL | INTRAVENOUS | Status: DC | PRN

## 2014-09-19 MED ORDER — SODIUM CHLORIDE 0.9 % IV SOLN: Freq: Once | INTRAVENOUS | Status: DC

## 2014-09-19 MED ORDER — ALTEPLASE 2 MG IJ SOLR
2.0000 mg | Freq: Once | INTRAMUSCULAR | Status: DC | PRN
  Filled 2014-09-19: qty 2

## 2014-09-19 MED ORDER — DARBEPOETIN ALFA 200 MCG/0.4ML IJ SOSY
PREFILLED_SYRINGE | INTRAMUSCULAR | Status: AC
  Administered 2014-09-19: 200 ug via INTRAVENOUS
  Filled 2014-09-19: qty 0.4

## 2014-09-19 MED ORDER — HEPARIN SODIUM (PORCINE) 1000 UNIT/ML DIALYSIS: 1000.0000 [IU] | INTRAMUSCULAR | Status: DC | PRN

## 2014-09-19 NOTE — ED Notes (Signed)
Pt in for HD tx, last tx Saturday, pt A&O x4, denies SOB, CP, n/v/d

## 2014-09-19 NOTE — Progress Notes (Signed)
Type and screen performed, 2uprbc ordered stat.

## 2014-09-19 NOTE — Progress Notes (Signed)
Spoke with blood bank. They are not sure when patient's 2uprbc will be ready due to antibodies. MD aware. Will transfuse pt when she dialyzes on Thursday 6.16.16

## 2014-09-19 NOTE — Progress Notes (Signed)
CRITICAL VALUE ALERT  Critical value received:  hgb 5.6  Date of notification:  09/19/14  Time of notification:  2025K Critical value read back:yes  Nurse who received alert:  Terence Lux, RN  MD notified (1st page):  Alfonso Ellis MD  Time of first page:  1147a  MD notified (2nd page):na  Time of second page:na  Responding MD:  Alfonso Ellis md  Time MD responded:  [phone]
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
## 2014-09-19 NOTE — ED Notes (Signed)
Pt requesting to be transported in a bed, pt transported to HD in a stretcher

## 2014-09-21 ENCOUNTER — Encounter (HOSPITAL_COMMUNITY): Payer: Self-pay | Admitting: Emergency Medicine

## 2014-09-21 ENCOUNTER — Emergency Department (HOSPITAL_COMMUNITY)
Admission: EM | Admit: 2014-09-21 | Discharge: 2014-09-21 | Discharge status: Against Medical Advice | Payer: Medicare Other | Attending: Emergency Medicine | Admitting: Emergency Medicine

## 2014-09-21 DIAGNOSIS — Z992 Dependence on renal dialysis: Secondary | ICD-10-CM | POA: Insufficient documentation

## 2014-09-21 DIAGNOSIS — R000 Tachycardia, unspecified: Secondary | ICD-10-CM | POA: Insufficient documentation

## 2014-09-21 DIAGNOSIS — N186 End stage renal disease: Secondary | ICD-10-CM | POA: Diagnosis not present

## 2014-09-21 LAB — TYPE AND SCREEN
ABO/RH(D): O POS
Antibody Screen: POSITIVE
DAT, IgG: POSITIVE
UNIT DIVISION: 0
Unit division: 0

## 2014-09-21 LAB — CBC WITH DIFFERENTIAL/PLATELET
BASOS PCT: 0 % (ref 0–1)
Basophils Absolute: 0 10*3/uL (ref 0.0–0.1)
EOS ABS: 0.1 10*3/uL (ref 0.0–0.7)
Eosinophils Relative: 1 % (ref 0–5)
HEMATOCRIT: 23.7 % — AB (ref 36.0–46.0)
Hemoglobin: 7.4 g/dL — ABNORMAL LOW (ref 12.0–15.0)
LYMPHS ABS: 1 10*3/uL (ref 0.7–4.0)
Lymphocytes Relative: 18 % (ref 12–46)
MCH: 24.1 pg — AB (ref 26.0–34.0)
MCHC: 31.2 g/dL (ref 30.0–36.0)
MCV: 77.2 fL — AB (ref 78.0–100.0)
MONOS PCT: 7 % (ref 3–12)
Monocytes Absolute: 0.4 10*3/uL (ref 0.1–1.0)
Neutro Abs: 3.8 10*3/uL (ref 1.7–7.7)
Neutrophils Relative %: 73 % (ref 43–77)
Platelets: 443 10*3/uL — ABNORMAL HIGH (ref 150–400)
RBC: 3.07 MIL/uL — ABNORMAL LOW (ref 3.87–5.11)
RDW: 21.4 % — ABNORMAL HIGH (ref 11.5–15.5)
WBC: 5.3 10*3/uL (ref 4.0–10.5)

## 2014-09-21 LAB — BASIC METABOLIC PANEL
ANION GAP: 18 — AB (ref 5–15)
BUN: 33 mg/dL — AB (ref 6–20)
CO2: 25 mmol/L (ref 22–32)
CREATININE: 10.18 mg/dL — AB (ref 0.44–1.00)
Calcium: 9.4 mg/dL (ref 8.9–10.3)
Chloride: 94 mmol/L — ABNORMAL LOW (ref 101–111)
GFR, EST AFRICAN AMERICAN: 5 mL/min — AB (ref >60)
GFR, EST NON AFRICAN AMERICAN: 4 mL/min — AB (ref >60)
Glucose, Bld: 88 mg/dL (ref 65–99)
Potassium: 4.3 mmol/L (ref 3.5–5.1)
Sodium: 137 mmol/L (ref 135–145)

## 2014-09-21 LAB — PREPARE RBC (CROSSMATCH)

## 2014-09-21 MED ORDER — ACETAMINOPHEN 325 MG PO TABS
650.0000 mg | ORAL_TABLET | Freq: Four times a day (QID) | ORAL | Status: DC | PRN
  Administered 2014-09-21: 650 mg via ORAL

## 2014-09-21 MED ORDER — NEPRO/CARBSTEADY PO LIQD: 237.0000 mL | ORAL | Status: DC | PRN

## 2014-09-21 MED ORDER — PENTAFLUOROPROP-TETRAFLUOROETH EX AERO: 1.0000 "application " | INHALATION_SPRAY | CUTANEOUS | Status: DC | PRN

## 2014-09-21 MED ORDER — ALTEPLASE 2 MG IJ SOLR: 2.0000 mg | Freq: Once | INTRAMUSCULAR | Status: DC | PRN

## 2014-09-21 MED ORDER — LIDOCAINE-PRILOCAINE 2.5-2.5 % EX CREA: 1.0000 "application " | TOPICAL_CREAM | CUTANEOUS | Status: DC | PRN

## 2014-09-21 MED ORDER — SODIUM CHLORIDE 0.9 % IV SOLN: Freq: Once | INTRAVENOUS | Status: DC

## 2014-09-21 MED ORDER — HEPARIN SODIUM (PORCINE) 1000 UNIT/ML DIALYSIS: 1000.0000 [IU] | INTRAMUSCULAR | Status: DC | PRN

## 2014-09-21 MED ORDER — ACETAMINOPHEN 325 MG PO TABS
ORAL_TABLET | ORAL | Status: AC
  Filled 2014-09-21: qty 2

## 2014-09-21 MED ORDER — SODIUM CHLORIDE 0.9 % IV SOLN: 100.0000 mL | INTRAVENOUS | Status: DC | PRN

## 2014-09-21 MED ORDER — LIDOCAINE HCL (PF) 1 % IJ SOLN: 5.0000 mL | INTRAMUSCULAR | Status: DC | PRN

## 2014-09-21 NOTE — ED Notes (Signed)
Pt asleep.  Called dialysis to find out what time they would be ready.  She should be able to go up within the hour, they will call.

## 2014-09-21 NOTE — ED Notes (Signed)
Pt resting, talking with the sheriff deputies at the bedside

## 2014-09-21 NOTE — Progress Notes (Addendum)
Patient ended treatments AMA.  Rinsedback . She completed 3 out of 4 hours, removed 1898 of 5 liter goal  Due to low blood pressure. Discharged on foot with [employer] corrections.

## 2014-09-21 NOTE — ED Notes (Signed)
Pt given a cup of ice and readjusted on stretcher; sheriff deputies at bedside

## 2014-09-21 NOTE — ED Notes (Signed)
Pt presents from [facility] for dialysis and states she also needs blood.  Last time she was here at dialysis they told her she needed blood.  No other complaints at this time. Does not appear in acute distress.
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
## 2014-09-21 NOTE — ED Notes (Signed)
Dark green tube sent to minilab.  Long pink with type and screen card, light blue, light green, and two lavender tubes sent to main lab.

## 2014-09-22 LAB — HEPATITIS B SURFACE ANTIGEN: HEP B S AG: NEGATIVE

## 2014-09-23 ENCOUNTER — Non-Acute Institutional Stay (HOSPITAL_COMMUNITY)
Admission: EM | Admit: 2014-09-23 | Discharge: 2014-09-23 | Payer: Medicare Other | Attending: Emergency Medicine | Admitting: Emergency Medicine

## 2014-09-23 ENCOUNTER — Encounter (HOSPITAL_COMMUNITY): Payer: Self-pay | Admitting: Emergency Medicine

## 2014-09-23 DIAGNOSIS — Z992 Dependence on renal dialysis: Secondary | ICD-10-CM | POA: Insufficient documentation

## 2014-09-23 DIAGNOSIS — F17210 Nicotine dependence, cigarettes, uncomplicated: Secondary | ICD-10-CM | POA: Insufficient documentation

## 2014-09-23 DIAGNOSIS — I120 Hypertensive chronic kidney disease with stage 5 chronic kidney disease or end stage renal disease: Secondary | ICD-10-CM | POA: Diagnosis present

## 2014-09-23 DIAGNOSIS — N186 End stage renal disease: Secondary | ICD-10-CM | POA: Diagnosis not present

## 2014-09-23 LAB — CBC WITH DIFFERENTIAL/PLATELET
BASOS ABS: 0 10*3/uL (ref 0.0–0.1)
Basophils Relative: 0 % (ref 0–1)
Eosinophils Absolute: 0.1 10*3/uL (ref 0.0–0.7)
Eosinophils Relative: 2 % (ref 0–5)
HEMATOCRIT: 22.6 % — AB (ref 36.0–46.0)
Hemoglobin: 7 g/dL — ABNORMAL LOW (ref 12.0–15.0)
Lymphocytes Relative: 21 % (ref 12–46)
Lymphs Abs: 1.4 10*3/uL (ref 0.7–4.0)
MCH: 24.6 pg — ABNORMAL LOW (ref 26.0–34.0)
MCHC: 31 g/dL (ref 30.0–36.0)
MCV: 79.3 fL (ref 78.0–100.0)
MONO ABS: 0.5 10*3/uL (ref 0.1–1.0)
MONOS PCT: 8 % (ref 3–12)
NEUTROS ABS: 4.3 10*3/uL (ref 1.7–7.7)
Neutrophils Relative %: 69 % (ref 43–77)
Platelets: 349 10*3/uL (ref 150–400)
RBC: 2.85 MIL/uL — ABNORMAL LOW (ref 3.87–5.11)
RDW: 21 % — ABNORMAL HIGH (ref 11.5–15.5)
WBC: 6.3 10*3/uL (ref 4.0–10.5)

## 2014-09-23 LAB — BASIC METABOLIC PANEL
ANION GAP: 13 (ref 5–15)
BUN: 32 mg/dL — ABNORMAL HIGH (ref 6–20)
CO2: 25 mmol/L (ref 22–32)
Calcium: 9.5 mg/dL (ref 8.9–10.3)
Chloride: 96 mmol/L — ABNORMAL LOW (ref 101–111)
Creatinine, Ser: 9.34 mg/dL — ABNORMAL HIGH (ref 0.44–1.00)
GFR, EST AFRICAN AMERICAN: 6 mL/min — AB (ref >60)
GFR, EST NON AFRICAN AMERICAN: 5 mL/min — AB (ref >60)
GLUCOSE: 98 mg/dL (ref 65–99)
Potassium: 4.3 mmol/L (ref 3.5–5.1)
SODIUM: 134 mmol/L — AB (ref 135–145)

## 2014-09-23 MED ORDER — LIDOCAINE HCL (PF) 1 % IJ SOLN: 5.0000 mL | INTRAMUSCULAR | Status: DC | PRN

## 2014-09-23 MED ORDER — ALTEPLASE 2 MG IJ SOLR
2.0000 mg | Freq: Once | INTRAMUSCULAR | Status: DC | PRN
  Filled 2014-09-23: qty 2

## 2014-09-23 MED ORDER — NEPRO/CARBSTEADY PO LIQD
237.0000 mL | ORAL | Status: DC | PRN
  Filled 2014-09-23: qty 237

## 2014-09-23 MED ORDER — SODIUM CHLORIDE 0.9 % IV SOLN: 100.0000 mL | INTRAVENOUS | Status: DC | PRN

## 2014-09-23 MED ORDER — ACETAMINOPHEN 325 MG PO TABS
650.0000 mg | ORAL_TABLET | Freq: Once | ORAL | Status: AC
  Administered 2014-09-23: 650 mg via ORAL

## 2014-09-23 MED ORDER — ACETAMINOPHEN 325 MG PO TABS
ORAL_TABLET | ORAL | Status: AC
  Filled 2014-09-23: qty 2

## 2014-09-23 MED ORDER — LIDOCAINE-PRILOCAINE 2.5-2.5 % EX CREA
1.0000 "application " | TOPICAL_CREAM | CUTANEOUS | Status: DC | PRN
  Filled 2014-09-23: qty 5

## 2014-09-23 MED ORDER — PENTAFLUOROPROP-TETRAFLUOROETH EX AERO
1.0000 "application " | INHALATION_SPRAY | CUTANEOUS | Status: DC | PRN
  Filled 2014-09-23: qty 30

## 2014-09-23 MED ORDER — HEPARIN SODIUM (PORCINE) 1000 UNIT/ML DIALYSIS
20.0000 [IU]/kg | Freq: Once | INTRAMUSCULAR | Status: DC
  Filled 2014-09-23: qty 2

## 2014-09-23 MED ORDER — HEPARIN SODIUM (PORCINE) 1000 UNIT/ML DIALYSIS
1000.0000 [IU] | INTRAMUSCULAR | Status: DC | PRN
  Filled 2014-09-23: qty 1

## 2014-09-23 NOTE — Progress Notes (Signed)
HD tx complete without adverse events.  UF goal met.  VSS and pt without complaint.  Pt discharged to care of [facility].

## 2014-09-23 NOTE — ED Notes (Signed)
Sheriff's deputies at bedside.  Patient wearing handcuffs.  Patient resting, no distress noted, circulation is in tact.

## 2014-09-23 NOTE — ED Notes (Signed)
Hand cuff removed from pt so she could eat lunch. Sheriff at bedside.

## 2014-09-23 NOTE — ED Notes (Signed)
Dialysis phoned for status update on receiving patient.  Charge RN to call this RN back regarding timeframe.
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
## 2014-09-23 NOTE — ED Notes (Addendum)
Sheriff remains at pts bedside. Pt is in hand cuffs initiated by sheriff.Pt has one hand cuff on right wrist. Pt is resting in bed. Radial pulses are  present and equal bilaterally with sensation intact.

## 2014-09-23 NOTE — ED Notes (Signed)
Patient needs dialysis, receives every Tuesday, Thursday and Saturday.  Patient is accompanied by two Sheriff's deputies due to missing her psychiatry appointment.

## 2014-09-25 LAB — TYPE AND SCREEN
ABO/RH(D): O POS
ANTIBODY SCREEN: POSITIVE
UNIT DIVISION: 0

## 2014-09-26 ENCOUNTER — Non-Acute Institutional Stay (HOSPITAL_COMMUNITY)
Admission: EM | Admit: 2014-09-26 | Discharge: 2014-09-26 | Payer: Medicare Other | Attending: Emergency Medicine | Admitting: Emergency Medicine

## 2014-09-26 ENCOUNTER — Encounter (HOSPITAL_COMMUNITY): Payer: Self-pay | Admitting: Emergency Medicine

## 2014-09-26 DIAGNOSIS — Z881 Allergy status to other antibiotic agents status: Secondary | ICD-10-CM | POA: Diagnosis not present

## 2014-09-26 DIAGNOSIS — M545 Low back pain, unspecified: Secondary | ICD-10-CM

## 2014-09-26 DIAGNOSIS — Z79899 Other long term (current) drug therapy: Secondary | ICD-10-CM | POA: Insufficient documentation

## 2014-09-26 DIAGNOSIS — M3214 Glomerular disease in systemic lupus erythematosus: Secondary | ICD-10-CM | POA: Insufficient documentation

## 2014-09-26 DIAGNOSIS — Z888 Allergy status to other drugs, medicaments and biological substances status: Secondary | ICD-10-CM | POA: Insufficient documentation

## 2014-09-26 DIAGNOSIS — N186 End stage renal disease: Secondary | ICD-10-CM | POA: Diagnosis present

## 2014-09-26 DIAGNOSIS — F319 Bipolar disorder, unspecified: Secondary | ICD-10-CM | POA: Insufficient documentation

## 2014-09-26 DIAGNOSIS — F29 Unspecified psychosis not due to a substance or known physiological condition: Secondary | ICD-10-CM | POA: Diagnosis not present

## 2014-09-26 DIAGNOSIS — M546 Pain in thoracic spine: Secondary | ICD-10-CM | POA: Diagnosis present

## 2014-09-26 DIAGNOSIS — Z992 Dependence on renal dialysis: Secondary | ICD-10-CM | POA: Diagnosis not present

## 2014-09-26 DIAGNOSIS — F17210 Nicotine dependence, cigarettes, uncomplicated: Secondary | ICD-10-CM | POA: Insufficient documentation

## 2014-09-26 DIAGNOSIS — E8770 Fluid overload, unspecified: Secondary | ICD-10-CM | POA: Diagnosis present

## 2014-09-26 DIAGNOSIS — F209 Schizophrenia, unspecified: Secondary | ICD-10-CM | POA: Insufficient documentation

## 2014-09-26 DIAGNOSIS — I120 Hypertensive chronic kidney disease with stage 5 chronic kidney disease or end stage renal disease: Secondary | ICD-10-CM | POA: Insufficient documentation

## 2014-09-26 DIAGNOSIS — D649 Anemia, unspecified: Secondary | ICD-10-CM

## 2014-09-26 LAB — BASIC METABOLIC PANEL
Anion gap: 13 (ref 5–15)
BUN: 45 mg/dL — ABNORMAL HIGH (ref 6–20)
CO2: 26 mmol/L (ref 22–32)
Calcium: 9 mg/dL (ref 8.9–10.3)
Chloride: 96 mmol/L — ABNORMAL LOW (ref 101–111)
Creatinine, Ser: 10.12 mg/dL — ABNORMAL HIGH (ref 0.44–1.00)
GFR calc Af Amer: 5 mL/min — ABNORMAL LOW (ref >60)
GFR calc non Af Amer: 4 mL/min — ABNORMAL LOW (ref >60)
Glucose, Bld: 139 mg/dL — ABNORMAL HIGH (ref 65–99)
Potassium: 4.5 mmol/L (ref 3.5–5.1)
Sodium: 135 mmol/L (ref 135–145)

## 2014-09-26 LAB — PREPARE RBC (CROSSMATCH)

## 2014-09-26 LAB — CBC WITH DIFFERENTIAL/PLATELET
Basophils Absolute: 0 10*3/uL (ref 0.0–0.1)
Basophils Relative: 0 % (ref 0–1)
Eosinophils Absolute: 0.2 10*3/uL (ref 0.0–0.7)
Eosinophils Relative: 2 % (ref 0–5)
HCT: 19.5 % — ABNORMAL LOW (ref 36.0–46.0)
Hemoglobin: 6.2 g/dL — CL (ref 12.0–15.0)
Lymphocytes Relative: 12 % (ref 12–46)
Lymphs Abs: 1 10*3/uL (ref 0.7–4.0)
MCH: 25.8 pg — ABNORMAL LOW (ref 26.0–34.0)
MCHC: 31.8 g/dL (ref 30.0–36.0)
MCV: 81.3 fL (ref 78.0–100.0)
Monocytes Absolute: 0.8 10*3/uL (ref 0.1–1.0)
Monocytes Relative: 10 % (ref 3–12)
Neutro Abs: 6.4 10*3/uL (ref 1.7–7.7)
Neutrophils Relative %: 76 % (ref 43–77)
Platelets: 354 10*3/uL (ref 150–400)
RBC: 2.4 MIL/uL — ABNORMAL LOW (ref 3.87–5.11)
RDW: 22 % — ABNORMAL HIGH (ref 11.5–15.5)
WBC: 8.4 10*3/uL (ref 4.0–10.5)

## 2014-09-26 MED ORDER — OXYCODONE-ACETAMINOPHEN 5-325 MG PO TABS
ORAL_TABLET | ORAL | Status: AC
  Filled 2014-09-26: qty 1

## 2014-09-26 MED ORDER — PENTAFLUOROPROP-TETRAFLUOROETH EX AERO
1.0000 "application " | INHALATION_SPRAY | CUTANEOUS | Status: DC | PRN
  Filled 2014-09-26: qty 30

## 2014-09-26 MED ORDER — LIDOCAINE HCL (PF) 1 % IJ SOLN: 5.0000 mL | INTRAMUSCULAR | Status: DC | PRN

## 2014-09-26 MED ORDER — OXYCODONE-ACETAMINOPHEN 5-325 MG PO TABS
1.0000 | ORAL_TABLET | Freq: Once | ORAL | Status: AC
  Administered 2014-09-26: 1 via ORAL
  Filled 2014-09-26: qty 1

## 2014-09-26 MED ORDER — SODIUM CHLORIDE 0.9 % IV SOLN: 100.0000 mL | INTRAVENOUS | Status: DC | PRN

## 2014-09-26 MED ORDER — OXYCODONE-ACETAMINOPHEN 5-325 MG PO TABS
1.0000 | ORAL_TABLET | Freq: Once | ORAL | Status: AC
  Administered 2014-09-26: 1 via ORAL

## 2014-09-26 MED ORDER — LIDOCAINE-PRILOCAINE 2.5-2.5 % EX CREA
1.0000 "application " | TOPICAL_CREAM | CUTANEOUS | Status: DC | PRN
  Filled 2014-09-26: qty 5

## 2014-09-26 MED ORDER — DARBEPOETIN ALFA 200 MCG/0.4ML IJ SOSY
PREFILLED_SYRINGE | INTRAMUSCULAR | Status: AC
Start: 2014-09-26 — End: 2014-09-26
  Filled 2014-09-26: qty 0.4

## 2014-09-26 MED ORDER — ALTEPLASE 2 MG IJ SOLR
2.0000 mg | Freq: Once | INTRAMUSCULAR | Status: DC | PRN
  Filled 2014-09-26: qty 2

## 2014-09-26 MED ORDER — HEPARIN SODIUM (PORCINE) 1000 UNIT/ML DIALYSIS
1000.0000 [IU] | INTRAMUSCULAR | Status: DC | PRN
  Filled 2014-09-26: qty 1

## 2014-09-26 MED ORDER — NEPRO/CARBSTEADY PO LIQD
237.0000 mL | ORAL | Status: DC | PRN
  Filled 2014-09-26: qty 237

## 2014-09-26 MED ORDER — HEPARIN SODIUM (PORCINE) 1000 UNIT/ML DIALYSIS
20.0000 [IU]/kg | INTRAMUSCULAR | Status: DC | PRN
  Filled 2014-09-26: qty 2

## 2014-09-26 MED ORDER — DARBEPOETIN ALFA 200 MCG/0.4ML IJ SOSY
200.0000 ug | PREFILLED_SYRINGE | Freq: Once | INTRAMUSCULAR | Status: AC
  Administered 2014-09-26: 200 ug via INTRAVENOUS

## 2014-09-26 NOTE — Procedures (Signed)
Patient was seen on dialysis and the procedure was supervised.  BFR 400  Via AVF BP is  114/72.   Patient appears to be tolerating treatment well- needs transfusion- has been ordered- will give aranesp today as well   Ishaq Maffei A 09/26/2014
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
## 2014-09-26 NOTE — ED Notes (Signed)
Pt resting quietly at the time. Sheriff at bedside. Vital signs stable. Denies pain. Pt updated on plan of care and hemodialysis.
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
## 2014-09-26 NOTE — ED Notes (Signed)
EDP Docherty notified of critical hemoglobin of 6.2 that was received from lab via telephone, read-back and acknowledged.

## 2014-09-26 NOTE — Progress Notes (Signed)
Pt received 1 unit of PRBCs; no s/s of a reaction noted; pt denies SOB, Pain, itching and dizziness. Pt left the unit accompanied by a [facility].

## 2014-09-27 LAB — TYPE AND SCREEN
ABO/RH(D): O POS
Antibody Screen: POSITIVE
DAT, IgG: NEGATIVE
Unit division: 0

## 2014-09-28 ENCOUNTER — Encounter (HOSPITAL_COMMUNITY): Payer: Self-pay | Admitting: Emergency Medicine

## 2014-09-28 ENCOUNTER — Emergency Department (HOSPITAL_COMMUNITY)
Admission: EM | Admit: 2014-09-28 | Discharge: 2014-09-28 | Disposition: A | Discharge status: Home / Self Care | Payer: Medicare Other | Attending: Emergency Medicine | Admitting: Emergency Medicine

## 2014-09-28 DIAGNOSIS — Z79899 Other long term (current) drug therapy: Secondary | ICD-10-CM | POA: Insufficient documentation

## 2014-09-28 DIAGNOSIS — D649 Anemia, unspecified: Secondary | ICD-10-CM | POA: Diagnosis not present

## 2014-09-28 DIAGNOSIS — Z992 Dependence on renal dialysis: Secondary | ICD-10-CM | POA: Insufficient documentation

## 2014-09-28 DIAGNOSIS — F319 Bipolar disorder, unspecified: Secondary | ICD-10-CM | POA: Insufficient documentation

## 2014-09-28 DIAGNOSIS — Z881 Allergy status to other antibiotic agents status: Secondary | ICD-10-CM | POA: Diagnosis not present

## 2014-09-28 DIAGNOSIS — F17210 Nicotine dependence, cigarettes, uncomplicated: Secondary | ICD-10-CM | POA: Insufficient documentation

## 2014-09-28 DIAGNOSIS — M549 Dorsalgia, unspecified: Secondary | ICD-10-CM | POA: Diagnosis present

## 2014-09-28 DIAGNOSIS — I120 Hypertensive chronic kidney disease with stage 5 chronic kidney disease or end stage renal disease: Secondary | ICD-10-CM | POA: Insufficient documentation

## 2014-09-28 DIAGNOSIS — N186 End stage renal disease: Secondary | ICD-10-CM

## 2014-09-28 DIAGNOSIS — F209 Schizophrenia, unspecified: Secondary | ICD-10-CM | POA: Diagnosis not present

## 2014-09-28 DIAGNOSIS — Z888 Allergy status to other drugs, medicaments and biological substances status: Secondary | ICD-10-CM | POA: Insufficient documentation

## 2014-09-28 DIAGNOSIS — M3214 Glomerular disease in systemic lupus erythematosus: Secondary | ICD-10-CM | POA: Diagnosis not present

## 2014-09-28 LAB — RENAL FUNCTION PANEL
Albumin: 2.3 g/dL — ABNORMAL LOW (ref 3.5–5.0)
Anion gap: 13 (ref 5–15)
BUN: 43 mg/dL — AB (ref 6–20)
CALCIUM: 9.2 mg/dL (ref 8.9–10.3)
CO2: 28 mmol/L (ref 22–32)
CREATININE: 8.83 mg/dL — AB (ref 0.44–1.00)
Chloride: 93 mmol/L — ABNORMAL LOW (ref 101–111)
GFR calc Af Amer: 6 mL/min — ABNORMAL LOW (ref >60)
GFR calc non Af Amer: 5 mL/min — ABNORMAL LOW (ref >60)
GLUCOSE: 98 mg/dL (ref 65–99)
PHOSPHORUS: 6.1 mg/dL — AB (ref 2.5–4.6)
Potassium: 4.9 mmol/L (ref 3.5–5.1)
Sodium: 134 mmol/L — ABNORMAL LOW (ref 135–145)

## 2014-09-28 LAB — CBC
HCT: 19.6 % — ABNORMAL LOW (ref 36.0–46.0)
Hemoglobin: 6.3 g/dL — CL (ref 12.0–15.0)
MCH: 25.7 pg — AB (ref 26.0–34.0)
MCHC: 32.1 g/dL (ref 30.0–36.0)
MCV: 80 fL (ref 78.0–100.0)
PLATELETS: 261 10*3/uL (ref 150–400)
RBC: 2.45 MIL/uL — AB (ref 3.87–5.11)
RDW: 21.9 % — ABNORMAL HIGH (ref 11.5–15.5)
WBC: 8.1 10*3/uL (ref 4.0–10.5)

## 2014-09-28 LAB — PREPARE RBC (CROSSMATCH)

## 2014-09-28 MED ORDER — SODIUM CHLORIDE 0.9 % IV SOLN: Freq: Once | INTRAVENOUS | Status: DC

## 2014-09-28 MED ORDER — ACETAMINOPHEN 325 MG PO TABS
ORAL_TABLET | ORAL | Status: AC
  Filled 2014-09-28: qty 2

## 2014-09-28 MED ORDER — SORBITOL 70 % SOLN: 30.0000 mL | Status: DC | PRN

## 2014-09-28 MED ORDER — CAMPHOR-MENTHOL 0.5-0.5 % EX LOTN: 1.0000 "application " | TOPICAL_LOTION | Freq: Three times a day (TID) | CUTANEOUS | Status: DC | PRN

## 2014-09-28 MED ORDER — ONDANSETRON HCL 4 MG/2ML IJ SOLN: 4.0000 mg | Freq: Four times a day (QID) | INTRAMUSCULAR | Status: DC | PRN

## 2014-09-28 MED ORDER — ACETAMINOPHEN 325 MG PO TABS
650.0000 mg | ORAL_TABLET | Freq: Four times a day (QID) | ORAL | Status: DC | PRN
  Administered 2014-09-28: 650 mg via ORAL

## 2014-09-28 MED ORDER — HYDROXYZINE HCL 25 MG PO TABS: 25.0000 mg | ORAL_TABLET | Freq: Three times a day (TID) | ORAL | Status: DC | PRN

## 2014-09-28 MED ORDER — NEPRO/CARBSTEADY PO LIQD: 237.0000 mL | Freq: Three times a day (TID) | ORAL | Status: DC | PRN

## 2014-09-28 MED ORDER — ZOLPIDEM TARTRATE 5 MG PO TABS: 5.0000 mg | ORAL_TABLET | Freq: Every evening | ORAL | Status: DC | PRN

## 2014-09-28 MED ORDER — DOCUSATE SODIUM 283 MG RE ENEM: 1.0000 | ENEMA | RECTAL | Status: DC | PRN

## 2014-09-28 MED ORDER — ACETAMINOPHEN 650 MG RE SUPP: 650.0000 mg | Freq: Four times a day (QID) | RECTAL | Status: DC | PRN

## 2014-09-28 MED ORDER — NAPROXEN 250 MG PO TABS
500.0000 mg | ORAL_TABLET | Freq: Once | ORAL | Status: AC
  Administered 2014-09-28: 500 mg via ORAL
  Filled 2014-09-28: qty 2

## 2014-09-28 MED ORDER — CALCIUM CARBONATE 1250 MG/5ML PO SUSP: 500.0000 mg | Freq: Four times a day (QID) | ORAL | Status: DC | PRN

## 2014-09-28 MED ORDER — ONDANSETRON HCL 4 MG PO TABS: 4.0000 mg | ORAL_TABLET | Freq: Four times a day (QID) | ORAL | Status: DC | PRN

## 2014-09-28 NOTE — Progress Notes (Signed)
Hemodialysis- Signed off treatment AMA with 35 minutes remaining. AMA form placed in chart. Pt has no complaints. Total uf =3.6L without issue, some cramping during the last few minutes noted. Pt notified to keep on blood bank bracelet for next treatment on Saturday so that she can receive 2 units prbcs per MD order. Discharged to home in stable condition to return Saturday. Note: pt received Aranesp 263mcg on 6/21- due again next week.

## 2014-09-28 NOTE — ED Notes (Signed)
Pt reports needing dialysis (T, TH, Sat); pt also reports upper back pain with no known source of injury; pt reports possibly the way I slept;

## 2014-09-28 NOTE — Progress Notes (Signed)
Hemodialysis- Hgb 6.3. MD notified. Order to transfuse 2 units PRBCs on HD today. Type and screen sent (pt has sample but took her blood band bracelet off at home so new sample had to be sent). Per blood bank pt has antibodies, not sure how long will take to get 2 units ready. PA notified of this. If not able to transfuse on HD today will give to patient when she returns on Saturday. Continue to monitor.

## 2014-09-28 NOTE — Progress Notes (Signed)
CRITICAL VALUE ALERT  Critical value received:  Hgb 6.3  Date of notification:  09/28/14   Time of notification: 3437  Critical value read back:yes  Nurse who received alert:  Louretta Shorten RN  MD notified (1st page):  Moshe Cipro (on unit)  Time of first page: on unit  MD notified (2nd page):  Time of second page:  Responding MD:  Moshe Cipro   Time MD responded:  1030  Transfuse 2 units on HD

## 2014-09-28 NOTE — ED Notes (Signed)
Pt reports similar back pain, to what she is experiencing now, on Tuesday before she received dialysis. Pt states back pain was relieved after dialysis. States she got 2 doses of pain medication (percocet) that helped as well.
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
## 2014-09-28 NOTE — ED Notes (Signed)
Pt anxious about wait time to be moved to dialysis unit for treatment. Upon entering Pt room ,Pt was observed to be on her personal telephone calling the IN Patient to ask how long her wait time is going to be.

## 2014-09-28 NOTE — Procedures (Signed)
Patient was seen on dialysis and the procedure was supervised.  BFR 400  Via AVF BP is  133/74.   Patient appears to be tolerating treatment well- hgb 6.3- was low on Tuesday gave blood - needs blood again- gave 200 of aranesp on Tuesday 6/21 as well   Sara [name] A 09/28/2014

## 2014-09-30 ENCOUNTER — Non-Acute Institutional Stay (HOSPITAL_COMMUNITY)
Admission: EM | Admit: 2014-09-30 | Discharge: 2014-09-30 | Disposition: A | Discharge status: Home / Self Care | Payer: Medicare Other | Attending: Emergency Medicine | Admitting: Emergency Medicine

## 2014-09-30 ENCOUNTER — Encounter (HOSPITAL_COMMUNITY): Payer: Self-pay | Admitting: *Deleted

## 2014-09-30 DIAGNOSIS — M3214 Glomerular disease in systemic lupus erythematosus: Secondary | ICD-10-CM | POA: Insufficient documentation

## 2014-09-30 DIAGNOSIS — M545 Low back pain: Secondary | ICD-10-CM | POA: Insufficient documentation

## 2014-09-30 DIAGNOSIS — Z992 Dependence on renal dialysis: Secondary | ICD-10-CM | POA: Diagnosis not present

## 2014-09-30 DIAGNOSIS — Z888 Allergy status to other drugs, medicaments and biological substances status: Secondary | ICD-10-CM | POA: Diagnosis not present

## 2014-09-30 DIAGNOSIS — I120 Hypertensive chronic kidney disease with stage 5 chronic kidney disease or end stage renal disease: Secondary | ICD-10-CM | POA: Insufficient documentation

## 2014-09-30 DIAGNOSIS — Z79899 Other long term (current) drug therapy: Secondary | ICD-10-CM | POA: Diagnosis not present

## 2014-09-30 DIAGNOSIS — F209 Schizophrenia, unspecified: Secondary | ICD-10-CM | POA: Diagnosis not present

## 2014-09-30 DIAGNOSIS — N186 End stage renal disease: Secondary | ICD-10-CM | POA: Diagnosis not present

## 2014-09-30 DIAGNOSIS — F17210 Nicotine dependence, cigarettes, uncomplicated: Secondary | ICD-10-CM | POA: Insufficient documentation

## 2014-09-30 DIAGNOSIS — D649 Anemia, unspecified: Secondary | ICD-10-CM | POA: Insufficient documentation

## 2014-09-30 DIAGNOSIS — Z883 Allergy status to other anti-infective agents status: Secondary | ICD-10-CM | POA: Insufficient documentation

## 2014-09-30 DIAGNOSIS — F319 Bipolar disorder, unspecified: Secondary | ICD-10-CM | POA: Insufficient documentation

## 2014-09-30 DIAGNOSIS — F29 Unspecified psychosis not due to a substance or known physiological condition: Secondary | ICD-10-CM | POA: Diagnosis not present

## 2014-09-30 LAB — RENAL FUNCTION PANEL
Albumin: 2.1 g/dL — ABNORMAL LOW (ref 3.5–5.0)
Anion gap: 13 (ref 5–15)
BUN: 48 mg/dL — ABNORMAL HIGH (ref 6–20)
CO2: 25 mmol/L (ref 22–32)
Calcium: 8.4 mg/dL — ABNORMAL LOW (ref 8.9–10.3)
Chloride: 93 mmol/L — ABNORMAL LOW (ref 101–111)
Creatinine, Ser: 8.84 mg/dL — ABNORMAL HIGH (ref 0.44–1.00)
GFR calc Af Amer: 6 mL/min — ABNORMAL LOW (ref >60)
GFR, EST NON AFRICAN AMERICAN: 5 mL/min — AB (ref >60)
Glucose, Bld: 106 mg/dL — ABNORMAL HIGH (ref 65–99)
Phosphorus: 6.8 mg/dL — ABNORMAL HIGH (ref 2.5–4.6)
Potassium: 5.1 mmol/L (ref 3.5–5.1)
Sodium: 131 mmol/L — ABNORMAL LOW (ref 135–145)

## 2014-09-30 LAB — IRON AND TIBC
IRON: 14 ug/dL — AB (ref 28–170)
Saturation Ratios: 7 % — ABNORMAL LOW (ref 10.4–31.8)
TIBC: 189 ug/dL — ABNORMAL LOW (ref 250–450)
UIBC: 175 ug/dL

## 2014-09-30 LAB — I-STAT CHEM 8, ED
BUN: 46 mg/dL — ABNORMAL HIGH (ref 6–20)
CALCIUM ION: 1.16 mmol/L (ref 1.12–1.23)
CHLORIDE: 96 mmol/L — AB (ref 101–111)
CREATININE: 8.6 mg/dL — AB (ref 0.44–1.00)
Glucose, Bld: 69 mg/dL (ref 65–99)
HCT: 20 % — ABNORMAL LOW (ref 36.0–46.0)
HEMOGLOBIN: 6.8 g/dL — AB (ref 12.0–15.0)
Potassium: 5 mmol/L (ref 3.5–5.1)
SODIUM: 131 mmol/L — AB (ref 135–145)
TCO2: 25 mmol/L (ref 0–100)

## 2014-09-30 LAB — CBC
HCT: 18.9 % — ABNORMAL LOW (ref 36.0–46.0)
MCH: 25.4 pg — AB (ref 26.0–34.0)
MCHC: 31.7 g/dL (ref 30.0–36.0)
MCV: 80.1 fL (ref 78.0–100.0)
PLATELETS: 361 10*3/uL (ref 150–400)
RBC: 2.36 MIL/uL — AB (ref 3.87–5.11)
RDW: 22.7 % — ABNORMAL HIGH (ref 11.5–15.5)
WBC: 9.8 10*3/uL (ref 4.0–10.5)

## 2014-09-30 LAB — FERRITIN: FERRITIN: 586 ng/mL — AB (ref 11–307)

## 2014-09-30 MED ORDER — SORBITOL 70 % SOLN: 30.0000 mL | Status: DC | PRN

## 2014-09-30 MED ORDER — LIDOCAINE HCL (PF) 1 % IJ SOLN: 5.0000 mL | INTRAMUSCULAR | Status: DC | PRN

## 2014-09-30 MED ORDER — NEPRO/CARBSTEADY PO LIQD: 237.0000 mL | ORAL | Status: DC | PRN

## 2014-09-30 MED ORDER — CAMPHOR-MENTHOL 0.5-0.5 % EX LOTN: 1.0000 "application " | TOPICAL_LOTION | Freq: Three times a day (TID) | CUTANEOUS | Status: DC | PRN

## 2014-09-30 MED ORDER — ONDANSETRON HCL 4 MG PO TABS: 4.0000 mg | ORAL_TABLET | Freq: Four times a day (QID) | ORAL | Status: DC | PRN

## 2014-09-30 MED ORDER — ACETAMINOPHEN 650 MG RE SUPP: 650.0000 mg | Freq: Four times a day (QID) | RECTAL | Status: DC | PRN

## 2014-09-30 MED ORDER — DOCUSATE SODIUM 283 MG RE ENEM: 1.0000 | ENEMA | RECTAL | Status: DC | PRN

## 2014-09-30 MED ORDER — HEPARIN SODIUM (PORCINE) 1000 UNIT/ML DIALYSIS: 1000.0000 [IU] | INTRAMUSCULAR | Status: DC | PRN

## 2014-09-30 MED ORDER — LIDOCAINE-PRILOCAINE 2.5-2.5 % EX CREA: 1.0000 "application " | TOPICAL_CREAM | CUTANEOUS | Status: DC | PRN

## 2014-09-30 MED ORDER — PENTAFLUOROPROP-TETRAFLUOROETH EX AERO: 1.0000 "application " | INHALATION_SPRAY | CUTANEOUS | Status: DC | PRN

## 2014-09-30 MED ORDER — ZOLPIDEM TARTRATE 5 MG PO TABS: 5.0000 mg | ORAL_TABLET | Freq: Every evening | ORAL | Status: DC | PRN

## 2014-09-30 MED ORDER — ONDANSETRON HCL 4 MG/2ML IJ SOLN: 4.0000 mg | Freq: Four times a day (QID) | INTRAMUSCULAR | Status: DC | PRN

## 2014-09-30 MED ORDER — HEPARIN SODIUM (PORCINE) 1000 UNIT/ML DIALYSIS: 20.0000 [IU]/kg | INTRAMUSCULAR | Status: DC | PRN

## 2014-09-30 MED ORDER — ALTEPLASE 2 MG IJ SOLR: 2.0000 mg | Freq: Once | INTRAMUSCULAR | Status: DC | PRN

## 2014-09-30 MED ORDER — SODIUM CHLORIDE 0.9 % IV SOLN: 100.0000 mL | INTRAVENOUS | Status: DC | PRN

## 2014-09-30 MED ORDER — ACETAMINOPHEN 325 MG PO TABS
650.0000 mg | ORAL_TABLET | Freq: Four times a day (QID) | ORAL | Status: DC | PRN
  Administered 2014-09-30: 650 mg via ORAL

## 2014-09-30 MED ORDER — ACETAMINOPHEN 325 MG PO TABS
ORAL_TABLET | ORAL | Status: AC
  Filled 2014-09-30: qty 2

## 2014-09-30 MED ORDER — HYDROXYZINE HCL 25 MG PO TABS: 25.0000 mg | ORAL_TABLET | Freq: Three times a day (TID) | ORAL | Status: DC | PRN

## 2014-09-30 MED ORDER — SODIUM CHLORIDE 0.9 % IV SOLN: Freq: Once | INTRAVENOUS | Status: DC

## 2014-09-30 MED ORDER — CALCIUM CARBONATE 1250 MG/5ML PO SUSP: 500.0000 mg | Freq: Four times a day (QID) | ORAL | Status: DC | PRN

## 2014-09-30 MED ORDER — NEPRO/CARBSTEADY PO LIQD: 237.0000 mL | Freq: Three times a day (TID) | ORAL | Status: DC | PRN

## 2014-09-30 MED ORDER — SODIUM CHLORIDE 0.9 % IV SOLN
250.0000 mg | Freq: Once | INTRAVENOUS | Status: AC
  Administered 2014-09-30: 250 mg via INTRAVENOUS
  Filled 2014-09-30: qty 20

## 2014-09-30 NOTE — ED Notes (Signed)
Pt here for dialysis, last treatment was 6/23 and pt reports needing blood transfusion. No distress noted.
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
## 2014-09-30 NOTE — ED Notes (Signed)
Pt meal tray ordered at this time.

## 2014-09-30 NOTE — Progress Notes (Signed)
Hemodialysis- 5L UF with HD without issue, slight cramping feeling in side post treatment. Medicated with tylenol. Pt to come back Monday for HD and receive blood. Discussed the utmost importance of not taking off her blood band bracelet or she will not be able to receive blood. Pt states understanding.  Discharged to home per MD order. Vitals stable and no complaints.

## 2014-09-30 NOTE — Progress Notes (Addendum)
Pt to receive PRBCs today with HD, however pt "misplaced" blood bank bracelet. Awaiting cbc results. Continue to monitor patient.

## 2014-09-30 NOTE — ED Notes (Signed)
Pt reports last dialysis was 6/23 but was unable to get blood transfusion that day, hgb <7. No distress and no complaints.

## 2014-09-30 NOTE — Progress Notes (Signed)
Pt presented to the ED for her dialysis treatment. Labs - K 5, Hb 6.8 She had type and screen done when last here 6/23 but units could not be obtained as she has antibodies She took off her bracelet and "misplaced" so can't give her the available units without new T and S. She rec'd Aranesp 200 on 6/23. To have Fe panel done today, will give 250 IV Fe with HD Will use 2K2Ca bath Access AVF Goal 5L Home after HD  Jamal Maes, MD [place] Pager 09/30/2014, 3:12 PM

## 2014-10-01 LAB — TYPE AND SCREEN
ABO/RH(D): O POS
ANTIBODY SCREEN: POSITIVE
DAT, IGG: POSITIVE
DONOR AG TYPE: NEGATIVE
Donor AG Type: NEGATIVE
UNIT DIVISION: 0
UNIT DIVISION: 0
Unit division: 0

## 2014-10-01 LAB — PREPARE RBC (CROSSMATCH)

## 2014-10-02 ENCOUNTER — Non-Acute Institutional Stay (HOSPITAL_COMMUNITY)
Admission: EM | Admit: 2014-10-02 | Discharge: 2014-10-03 | Disposition: A | Discharge status: Home / Self Care | Payer: Medicare Other | Attending: Emergency Medicine | Admitting: Emergency Medicine

## 2014-10-02 ENCOUNTER — Encounter (HOSPITAL_COMMUNITY): Payer: Self-pay

## 2014-10-02 DIAGNOSIS — N186 End stage renal disease: Secondary | ICD-10-CM | POA: Diagnosis not present

## 2014-10-02 DIAGNOSIS — K644 Residual hemorrhoidal skin tags: Secondary | ICD-10-CM | POA: Insufficient documentation

## 2014-10-02 DIAGNOSIS — Z881 Allergy status to other antibiotic agents status: Secondary | ICD-10-CM | POA: Diagnosis not present

## 2014-10-02 DIAGNOSIS — Z79899 Other long term (current) drug therapy: Secondary | ICD-10-CM | POA: Insufficient documentation

## 2014-10-02 DIAGNOSIS — F17210 Nicotine dependence, cigarettes, uncomplicated: Secondary | ICD-10-CM | POA: Diagnosis not present

## 2014-10-02 DIAGNOSIS — M545 Low back pain: Secondary | ICD-10-CM | POA: Insufficient documentation

## 2014-10-02 DIAGNOSIS — I120 Hypertensive chronic kidney disease with stage 5 chronic kidney disease or end stage renal disease: Secondary | ICD-10-CM | POA: Diagnosis present

## 2014-10-02 DIAGNOSIS — D649 Anemia, unspecified: Secondary | ICD-10-CM | POA: Diagnosis not present

## 2014-10-02 DIAGNOSIS — F319 Bipolar disorder, unspecified: Secondary | ICD-10-CM | POA: Insufficient documentation

## 2014-10-02 DIAGNOSIS — Z992 Dependence on renal dialysis: Secondary | ICD-10-CM | POA: Insufficient documentation

## 2014-10-02 DIAGNOSIS — F29 Unspecified psychosis not due to a substance or known physiological condition: Secondary | ICD-10-CM | POA: Insufficient documentation

## 2014-10-02 DIAGNOSIS — M3214 Glomerular disease in systemic lupus erythematosus: Secondary | ICD-10-CM | POA: Diagnosis not present

## 2014-10-02 DIAGNOSIS — F209 Schizophrenia, unspecified: Secondary | ICD-10-CM | POA: Diagnosis not present

## 2014-10-02 DIAGNOSIS — Z888 Allergy status to other drugs, medicaments and biological substances status: Secondary | ICD-10-CM | POA: Insufficient documentation

## 2014-10-02 LAB — CBC WITH DIFFERENTIAL/PLATELET
BASOS ABS: 0.1 10*3/uL (ref 0.0–0.1)
Basophils Relative: 1 % (ref 0–1)
Eosinophils Absolute: 0.2 10*3/uL (ref 0.0–0.7)
Eosinophils Relative: 2 % (ref 0–5)
HEMATOCRIT: 20.2 % — AB (ref 36.0–46.0)
Hemoglobin: 6.2 g/dL — CL (ref 12.0–15.0)
LYMPHS ABS: 1.3 10*3/uL (ref 0.7–4.0)
Lymphocytes Relative: 17 % (ref 12–46)
MCH: 24.9 pg — ABNORMAL LOW (ref 26.0–34.0)
MCHC: 30.7 g/dL (ref 30.0–36.0)
MCV: 81.1 fL (ref 78.0–100.0)
MONOS PCT: 16 % — AB (ref 3–12)
Monocytes Absolute: 1.2 10*3/uL — ABNORMAL HIGH (ref 0.1–1.0)
Neutro Abs: 4.7 10*3/uL (ref 1.7–7.7)
Neutrophils Relative %: 64 % (ref 43–77)
Platelets: 323 10*3/uL (ref 150–400)
RBC: 2.49 MIL/uL — ABNORMAL LOW (ref 3.87–5.11)
RDW: 22.9 % — AB (ref 11.5–15.5)
WBC: 7.5 10*3/uL (ref 4.0–10.5)

## 2014-10-02 LAB — BASIC METABOLIC PANEL
Anion gap: 14 (ref 5–15)
BUN: 43 mg/dL — AB (ref 6–20)
CALCIUM: 9 mg/dL (ref 8.9–10.3)
CO2: 25 mmol/L (ref 22–32)
CREATININE: 8.47 mg/dL — AB (ref 0.44–1.00)
Chloride: 94 mmol/L — ABNORMAL LOW (ref 101–111)
GFR calc Af Amer: 6 mL/min — ABNORMAL LOW (ref >60)
GFR, EST NON AFRICAN AMERICAN: 6 mL/min — AB (ref >60)
GLUCOSE: 91 mg/dL (ref 65–99)
Potassium: 4.1 mmol/L (ref 3.5–5.1)
SODIUM: 133 mmol/L — AB (ref 135–145)

## 2014-10-02 MED ORDER — SODIUM CHLORIDE 0.9 % IV SOLN: 100.0000 mL | INTRAVENOUS | Status: DC | PRN

## 2014-10-02 MED ORDER — NEPRO/CARBSTEADY PO LIQD: 237.0000 mL | ORAL | Status: DC | PRN

## 2014-10-02 MED ORDER — PENTAFLUOROPROP-TETRAFLUOROETH EX AERO: 1.0000 "application " | INHALATION_SPRAY | CUTANEOUS | Status: DC | PRN

## 2014-10-02 MED ORDER — ALTEPLASE 2 MG IJ SOLR
2.0000 mg | Freq: Once | INTRAMUSCULAR | Status: AC | PRN
  Filled 2014-10-02: qty 2

## 2014-10-02 MED ORDER — HEPARIN SODIUM (PORCINE) 1000 UNIT/ML DIALYSIS
1000.0000 [IU] | INTRAMUSCULAR | Status: DC | PRN
  Filled 2014-10-02: qty 1

## 2014-10-02 MED ORDER — LIDOCAINE-PRILOCAINE 2.5-2.5 % EX CREA: 1.0000 "application " | TOPICAL_CREAM | CUTANEOUS | Status: DC | PRN

## 2014-10-02 MED ORDER — LIDOCAINE HCL (PF) 1 % IJ SOLN: 5.0000 mL | INTRAMUSCULAR | Status: DC | PRN

## 2014-10-02 MED ORDER — SODIUM CHLORIDE 0.9 % IV SOLN: Freq: Once | INTRAVENOUS | Status: DC

## 2014-10-02 MED FILL — Darbepoetin Alfa Soln Prefilled Syringe 100 MCG/0.5ML: INTRAMUSCULAR | Qty: 0.5 | Status: AC

## 2014-10-02 NOTE — ED Notes (Signed)
Pt. Here for dialysis and states she needs a blood transfusion. Also stating she has a mark on her "bottom" that needs to be checked.

## 2014-10-02 NOTE — Progress Notes (Signed)
Hemodialysis: Patient was discharged. Patient stated that she was taking the bus to a shelter where she has been staying all V/S were WNL. Teachings regarding caring for her fistula were provided for the patient. Patient was alert and oriented x4 and with no s/s of adverse reaction.
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
## 2014-10-03 LAB — TYPE AND SCREEN
ABO/RH(D): O POS
Antibody Screen: POSITIVE
DAT, IgG: POSITIVE
DONOR AG TYPE: NEGATIVE
Donor AG Type: NEGATIVE
Unit division: 0
Unit division: 0

## 2014-10-04 ENCOUNTER — Encounter (HOSPITAL_COMMUNITY): Payer: Self-pay | Admitting: *Deleted

## 2014-10-04 ENCOUNTER — Emergency Department (HOSPITAL_COMMUNITY)
Admission: EM | Admit: 2014-10-04 | Discharge: 2014-10-05 | Disposition: A | Discharge status: Home / Self Care | Payer: Medicare Other | Attending: Emergency Medicine | Admitting: Emergency Medicine

## 2014-10-04 DIAGNOSIS — Z79899 Other long term (current) drug therapy: Secondary | ICD-10-CM | POA: Insufficient documentation

## 2014-10-04 DIAGNOSIS — Z992 Dependence on renal dialysis: Secondary | ICD-10-CM | POA: Insufficient documentation

## 2014-10-04 DIAGNOSIS — I120 Hypertensive chronic kidney disease with stage 5 chronic kidney disease or end stage renal disease: Secondary | ICD-10-CM | POA: Diagnosis not present

## 2014-10-04 DIAGNOSIS — Y841 Kidney dialysis as the cause of abnormal reaction of the patient, or of later complication, without mention of misadventure at the time of the procedure: Secondary | ICD-10-CM | POA: Insufficient documentation

## 2014-10-04 DIAGNOSIS — Z792 Long term (current) use of antibiotics: Secondary | ICD-10-CM | POA: Insufficient documentation

## 2014-10-04 DIAGNOSIS — N186 End stage renal disease: Secondary | ICD-10-CM | POA: Diagnosis not present

## 2014-10-04 DIAGNOSIS — F29 Unspecified psychosis not due to a substance or known physiological condition: Secondary | ICD-10-CM | POA: Diagnosis not present

## 2014-10-04 DIAGNOSIS — F209 Schizophrenia, unspecified: Secondary | ICD-10-CM | POA: Insufficient documentation

## 2014-10-04 DIAGNOSIS — Z862 Personal history of diseases of the blood and blood-forming organs and certain disorders involving the immune mechanism: Secondary | ICD-10-CM | POA: Diagnosis not present

## 2014-10-04 DIAGNOSIS — Z8739 Personal history of other diseases of the musculoskeletal system and connective tissue: Secondary | ICD-10-CM | POA: Diagnosis not present

## 2014-10-04 DIAGNOSIS — T82898A Other specified complication of vascular prosthetic devices, implants and grafts, initial encounter: Secondary | ICD-10-CM | POA: Diagnosis present

## 2014-10-04 DIAGNOSIS — Z720 Tobacco use: Secondary | ICD-10-CM | POA: Insufficient documentation

## 2014-10-04 LAB — CBC
HEMATOCRIT: 22.7 % — AB (ref 36.0–46.0)
HEMOGLOBIN: 7.2 g/dL — AB (ref 12.0–15.0)
MCH: 25.9 pg — ABNORMAL LOW (ref 26.0–34.0)
MCHC: 31.7 g/dL (ref 30.0–36.0)
MCV: 81.7 fL (ref 78.0–100.0)
PLATELETS: 272 10*3/uL (ref 150–400)
RBC: 2.78 MIL/uL — ABNORMAL LOW (ref 3.87–5.11)
RDW: 21.1 % — AB (ref 11.5–15.5)
WBC: 7.1 10*3/uL (ref 4.0–10.5)

## 2014-10-04 LAB — BASIC METABOLIC PANEL
Anion gap: 12 (ref 5–15)
BUN: 49 mg/dL — ABNORMAL HIGH (ref 6–20)
CALCIUM: 9.1 mg/dL (ref 8.9–10.3)
CO2: 26 mmol/L (ref 22–32)
Chloride: 97 mmol/L — ABNORMAL LOW (ref 101–111)
Creatinine, Ser: 8.61 mg/dL — ABNORMAL HIGH (ref 0.44–1.00)
GFR calc Af Amer: 6 mL/min — ABNORMAL LOW (ref >60)
GFR calc non Af Amer: 5 mL/min — ABNORMAL LOW (ref >60)
GLUCOSE: 74 mg/dL (ref 65–99)
POTASSIUM: 4.8 mmol/L (ref 3.5–5.1)
Sodium: 135 mmol/L (ref 135–145)

## 2014-10-04 MED ORDER — PENTAFLUOROPROP-TETRAFLUOROETH EX AERO
1.0000 "application " | INHALATION_SPRAY | CUTANEOUS | Status: DC | PRN
  Filled 2014-10-04: qty 30

## 2014-10-04 MED ORDER — SODIUM CHLORIDE 0.9 % IV SOLN
510.0000 mg | Freq: Once | INTRAVENOUS | Status: DC
  Filled 2014-10-04: qty 17

## 2014-10-04 MED ORDER — SODIUM CHLORIDE 0.9 % IV SOLN: 100.0000 mL | INTRAVENOUS | Status: DC | PRN

## 2014-10-04 MED ORDER — DARBEPOETIN ALFA 100 MCG/0.5ML IJ SOSY
100.0000 ug | PREFILLED_SYRINGE | Freq: Once | INTRAMUSCULAR | Status: AC
  Administered 2014-10-04: 100 ug via INTRAVENOUS
  Filled 2014-10-04: qty 0.5

## 2014-10-04 MED ORDER — ALTEPLASE 2 MG IJ SOLR
2.0000 mg | Freq: Once | INTRAMUSCULAR | Status: AC | PRN
  Filled 2014-10-04: qty 2

## 2014-10-04 MED ORDER — HEPARIN SODIUM (PORCINE) 1000 UNIT/ML DIALYSIS
6000.0000 [IU] | Freq: Once | INTRAMUSCULAR | Status: DC
  Filled 2014-10-04: qty 6

## 2014-10-04 MED ORDER — HEPARIN SODIUM (PORCINE) 1000 UNIT/ML DIALYSIS
1000.0000 [IU] | INTRAMUSCULAR | Status: DC | PRN
  Filled 2014-10-04: qty 1

## 2014-10-04 MED ORDER — NEPRO/CARBSTEADY PO LIQD
237.0000 mL | ORAL | Status: DC | PRN
  Filled 2014-10-04: qty 237

## 2014-10-04 MED ORDER — LIDOCAINE HCL (PF) 1 % IJ SOLN: 5.0000 mL | INTRAMUSCULAR | Status: DC | PRN

## 2014-10-04 MED ORDER — DARBEPOETIN ALFA 100 MCG/0.5ML IJ SOSY
PREFILLED_SYRINGE | INTRAMUSCULAR | Status: AC
  Filled 2014-10-04: qty 0.5

## 2014-10-04 MED ORDER — LIDOCAINE-PRILOCAINE 2.5-2.5 % EX CREA
1.0000 "application " | TOPICAL_CREAM | CUTANEOUS | Status: DC | PRN
  Filled 2014-10-04: qty 5

## 2014-10-04 NOTE — ED Notes (Signed)
Pt here for dialysis

## 2014-10-04 NOTE — ED Notes (Signed)
Pt is here for her dialysis.  Pt has no other compliants.

## 2014-10-04 NOTE — Procedures (Signed)
Patient still has no outpt HD unit. Here for HD. No complaints.    Volume stable with dry weight around 58-59kg. BP's good.   Anemia main issue recently with Hb's low in the 5-7 range, got 2u prbc's on 6/27  Tsat 7% on 6/25, has not rec'd any iron > 3 mos-- will give 500 mg feraheme today w HD  Darbe 200 on 6/21 Darbe 200 on 5/25 Darbe 60 on 4/25 Darbe 200 on 4/19  Old orders from [facility] Feb 2016 > 4 hrs59kg 2K/2CaHeparin 6000AVF R  I was present at this dialysis session, have reviewed the session itself and made  appropriate changes Kelly Splinter MD (pgr) [phone]    ([phone] 09/23/2014, 10:31 AM

## 2014-10-04 NOTE — ED Notes (Signed)
Pt placed in gown and in bed. Pt monitored by pulse ox and bp cuff per [employer].
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
## 2014-10-09 ENCOUNTER — Encounter (HOSPITAL_COMMUNITY): Payer: Self-pay | Admitting: Emergency Medicine

## 2014-10-09 ENCOUNTER — Observation Stay (HOSPITAL_COMMUNITY)
Admission: EM | Admit: 2014-10-09 | Discharge: 2014-10-10 | Disposition: A | Discharge status: Home / Self Care | Payer: Medicare Other | Attending: Family Medicine | Admitting: Family Medicine

## 2014-10-09 DIAGNOSIS — N289 Disorder of kidney and ureter, unspecified: Secondary | ICD-10-CM | POA: Diagnosis not present

## 2014-10-09 DIAGNOSIS — I120 Hypertensive chronic kidney disease with stage 5 chronic kidney disease or end stage renal disease: Secondary | ICD-10-CM | POA: Insufficient documentation

## 2014-10-09 DIAGNOSIS — Z79899 Other long term (current) drug therapy: Secondary | ICD-10-CM | POA: Diagnosis not present

## 2014-10-09 DIAGNOSIS — Z992 Dependence on renal dialysis: Secondary | ICD-10-CM

## 2014-10-09 DIAGNOSIS — N186 End stage renal disease: Secondary | ICD-10-CM | POA: Diagnosis not present

## 2014-10-09 DIAGNOSIS — R0602 Shortness of breath: Secondary | ICD-10-CM | POA: Diagnosis present

## 2014-10-09 DIAGNOSIS — D649 Anemia, unspecified: Principal | ICD-10-CM | POA: Diagnosis present

## 2014-10-09 DIAGNOSIS — M329 Systemic lupus erythematosus, unspecified: Secondary | ICD-10-CM | POA: Insufficient documentation

## 2014-10-09 DIAGNOSIS — Z720 Tobacco use: Secondary | ICD-10-CM | POA: Insufficient documentation

## 2014-10-09 HISTORY — DX: Anemia, unspecified: D64.9

## 2014-10-09 HISTORY — DX: Dependence on renal dialysis: Z99.2

## 2014-10-09 HISTORY — DX: End stage renal disease: N18.6

## 2014-10-09 HISTORY — DX: Personal history of other medical treatment: Z92.89

## 2014-10-09 HISTORY — DX: Heart failure, unspecified: I50.9

## 2014-10-09 HISTORY — DX: Bipolar disorder, unspecified: F31.9

## 2014-10-09 HISTORY — DX: Acute pericarditis, unspecified: I30.9

## 2014-10-09 LAB — RETICULOCYTES
RBC.: 2.18 MIL/uL — ABNORMAL LOW (ref 3.87–5.11)
RETIC COUNT ABSOLUTE: 76.3 10*3/uL (ref 19.0–186.0)
RETIC CT PCT: 3.5 % — AB (ref 0.4–3.1)

## 2014-10-09 LAB — BASIC METABOLIC PANEL
Anion gap: 16 — ABNORMAL HIGH (ref 5–15)
BUN: 91 mg/dL — ABNORMAL HIGH (ref 6–20)
CO2: 20 mmol/L — ABNORMAL LOW (ref 22–32)
CREATININE: 13.78 mg/dL — AB (ref 0.44–1.00)
Calcium: 8.9 mg/dL (ref 8.9–10.3)
Chloride: 96 mmol/L — ABNORMAL LOW (ref 101–111)
GFR, EST AFRICAN AMERICAN: 4 mL/min — AB (ref >60)
GFR, EST NON AFRICAN AMERICAN: 3 mL/min — AB (ref >60)
GLUCOSE: 92 mg/dL (ref 65–99)
Potassium: 5.9 mmol/L — ABNORMAL HIGH (ref 3.5–5.1)
Sodium: 132 mmol/L — ABNORMAL LOW (ref 135–145)

## 2014-10-09 LAB — HEPATIC FUNCTION PANEL
ALK PHOS: 65 U/L (ref 38–126)
ALT: 9 U/L — AB (ref 14–54)
AST: 15 U/L (ref 15–41)
Albumin: 2.3 g/dL — ABNORMAL LOW (ref 3.5–5.0)
Bilirubin, Direct: <0.1 mg/dL — ABNORMAL LOW (ref 0.1–0.5)
TOTAL PROTEIN: 7.1 g/dL (ref 6.5–8.1)
Total Bilirubin: 0.5 mg/dL (ref 0.3–1.2)

## 2014-10-09 LAB — CBC WITH DIFFERENTIAL/PLATELET
BASOS PCT: 0 % (ref 0–1)
Basophils Absolute: 0 10*3/uL (ref 0.0–0.1)
EOS PCT: 2 % (ref 0–5)
Eosinophils Absolute: 0.1 10*3/uL (ref 0.0–0.7)
HEMATOCRIT: 17.5 % — AB (ref 36.0–46.0)
HEMOGLOBIN: 5.6 g/dL — AB (ref 12.0–15.0)
LYMPHS ABS: 1.5 10*3/uL (ref 0.7–4.0)
Lymphocytes Relative: 30 % (ref 12–46)
MCH: 25.2 pg — AB (ref 26.0–34.0)
MCHC: 32 g/dL (ref 30.0–36.0)
MCV: 78.8 fL (ref 78.0–100.0)
MONO ABS: 0.7 10*3/uL (ref 0.1–1.0)
Monocytes Relative: 13 % — ABNORMAL HIGH (ref 3–12)
Neutro Abs: 2.7 10*3/uL (ref 1.7–7.7)
Neutrophils Relative %: 55 % (ref 43–77)
Platelets: 345 10*3/uL (ref 150–400)
RBC: 2.22 MIL/uL — ABNORMAL LOW (ref 3.87–5.11)
RDW: 23.1 % — AB (ref 11.5–15.5)
WBC: 5 10*3/uL (ref 4.0–10.5)

## 2014-10-09 LAB — VITAMIN B12: Vitamin B-12: 334 pg/mL (ref 180–914)

## 2014-10-09 LAB — FERRITIN: FERRITIN: 1171 ng/mL — AB (ref 11–307)

## 2014-10-09 LAB — FOLATE: Folate: 15.2 ng/mL (ref >5.9)

## 2014-10-09 LAB — IRON AND TIBC
Iron: 36 ug/dL (ref 28–170)
Saturation Ratios: 17 % (ref 10.4–31.8)
TIBC: 214 ug/dL — ABNORMAL LOW (ref 250–450)
UIBC: 178 ug/dL

## 2014-10-09 LAB — POC OCCULT BLOOD, ED: Fecal Occult Bld: NEGATIVE

## 2014-10-09 LAB — PREPARE RBC (CROSSMATCH)

## 2014-10-09 MED ORDER — SODIUM CHLORIDE 0.9 % IJ SOLN: 3.0000 mL | INTRAMUSCULAR | Status: DC | PRN

## 2014-10-09 MED ORDER — ACETAMINOPHEN 325 MG PO TABS
650.0000 mg | ORAL_TABLET | Freq: Four times a day (QID) | ORAL | Status: DC | PRN
  Administered 2014-10-09: 650 mg via ORAL
  Filled 2014-10-09: qty 2

## 2014-10-09 MED ORDER — HEPARIN SODIUM (PORCINE) 5000 UNIT/ML IJ SOLN
5000.0000 [IU] | Freq: Three times a day (TID) | INTRAMUSCULAR | Status: DC
  Filled 2014-10-09 (×4): qty 1

## 2014-10-09 MED ORDER — PANTOPRAZOLE SODIUM 40 MG IV SOLR
40.0000 mg | Freq: Two times a day (BID) | INTRAVENOUS | Status: DC
  Administered 2014-10-09: 40 mg via INTRAVENOUS
  Filled 2014-10-09 (×5): qty 40

## 2014-10-09 MED ORDER — DARBEPOETIN ALFA 200 MCG/0.4ML IJ SOSY
PREFILLED_SYRINGE | INTRAMUSCULAR | Status: AC
  Administered 2014-10-09: 200 ug via INTRAVENOUS
  Filled 2014-10-09: qty 0.4

## 2014-10-09 MED ORDER — SODIUM CHLORIDE 0.9 % IV SOLN: 250.0000 mL | INTRAVENOUS | Status: DC | PRN

## 2014-10-09 MED ORDER — SODIUM CHLORIDE 0.9 % IV SOLN
250.0000 mg | INTRAVENOUS | Status: DC
  Administered 2014-10-09: 250 mg via INTRAVENOUS
  Filled 2014-10-09 (×2): qty 20

## 2014-10-09 MED ORDER — HALOPERIDOL 5 MG PO TABS
5.0000 mg | ORAL_TABLET | Freq: Two times a day (BID) | ORAL | Status: DC
  Administered 2014-10-09: 5 mg via ORAL
  Filled 2014-10-09 (×3): qty 1

## 2014-10-09 MED ORDER — HYDROCORTISONE ACETATE 25 MG RE SUPP
25.0000 mg | Freq: Once | RECTAL | Status: DC
  Filled 2014-10-09: qty 1

## 2014-10-09 MED ORDER — SODIUM CHLORIDE 0.9 % IJ SOLN
3.0000 mL | Freq: Two times a day (BID) | INTRAMUSCULAR | Status: DC
  Administered 2014-10-09: 3 mL via INTRAVENOUS

## 2014-10-09 MED ORDER — SODIUM CHLORIDE 0.9 % IV SOLN: Freq: Once | INTRAVENOUS | Status: DC

## 2014-10-09 MED ORDER — CARVEDILOL 6.25 MG PO TABS
6.2500 mg | ORAL_TABLET | Freq: Two times a day (BID) | ORAL | Status: DC
  Administered 2014-10-09 – 2014-10-10 (×2): 6.25 mg via ORAL
  Filled 2014-10-09 (×4): qty 1

## 2014-10-09 MED ORDER — SODIUM CHLORIDE 0.9 % IV SOLN: 10.0000 mL/h | Freq: Once | INTRAVENOUS | Status: DC

## 2014-10-09 MED ORDER — DARBEPOETIN ALFA 200 MCG/0.4ML IJ SOSY
200.0000 ug | PREFILLED_SYRINGE | INTRAMUSCULAR | Status: AC
  Administered 2014-10-09: 200 ug via INTRAVENOUS
  Filled 2014-10-09: qty 0.4

## 2014-10-09 MED ORDER — ISOSORBIDE MONONITRATE ER 30 MG PO TB24
30.0000 mg | ORAL_TABLET | Freq: Every day | ORAL | Status: DC
  Filled 2014-10-09 (×3): qty 1

## 2014-10-09 MED ORDER — SODIUM CHLORIDE 0.9 % IJ SOLN: 3.0000 mL | Freq: Two times a day (BID) | INTRAMUSCULAR | Status: DC

## 2014-10-09 MED ORDER — HYDRALAZINE HCL 25 MG PO TABS
25.0000 mg | ORAL_TABLET | Freq: Three times a day (TID) | ORAL | Status: DC
  Administered 2014-10-09 – 2014-10-10 (×2): 25 mg via ORAL
  Filled 2014-10-09 (×5): qty 1

## 2014-10-09 NOTE — ED Notes (Signed)
Blood Bank currently working on type and screen.

## 2014-10-09 NOTE — ED Notes (Signed)
Pt sts she has not had dialysis since Thursday 10/05/14. Pt sts she has been SOB for two days.
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
## 2014-10-09 NOTE — Progress Notes (Signed)
Pt received HD with no complications. 4L fluid removed. Pt alert, vss, report given. Pt returned safely to room.

## 2014-10-09 NOTE — Procedures (Signed)
I have personally attended this patient's dialysis session. (she comes for prn HD as she has no dialysis home at present)  K 5.9 2K bath AVF 400 Getting no heparin d/t Hb 5.6 and dark stool  Needs blood again. Of note pt s/p transfusion of 2 units of PRBC's this past Tuesday 6/28 Has antibodies so crossmatch is difficult. May not get it today but can always HD again tomorrow to facilitate transfusion if need be  TSat is 17 - to get 250 of ferrlecit with HD today and will also give 200 of Aranesp. Goal 3-4 liters  Jamal Maes, MD [facility] [phone] Pager 10/09/2014, 1:02 PM

## 2014-10-09 NOTE — Progress Notes (Signed)
Admission note:  Arrival Method: Patient arrived from Hemodialysis on stretcher with nurse accompanying her. Mental Orientation:  Alert and oriented x 4. Telemetry: 6E-25, CCMD notified. Assessment:  See doc flow sheets. Skin: Dry, warm, and intact.  Scabs on the face, arms and on the back noted which patient is picking on them.  Assessed by two nurses Alcide Evener) IV: Left forearm, NOrmal saline. Pain:Denies any pain at this moment. Tubes: N/A Safety Measures: Bed in low position, phone and call light within reach. Fall Prevention Safety Plan: Reviewed the fall prevention safety plan, understood and acknowledged. Admission Screening: In progress. 6700 Orientation: Patient has been oriented to the unit, staff and to the room.

## 2014-10-09 NOTE — ED Provider Notes (Signed)
CSN: [id number]     Arrival date & time 10/09/14  0929 History   First MD Initiated Contact with Patient 10/09/14 [phone]     Chief Complaint  Patient presents with  . Shortness of Breath     (Consider location/radiation/quality/duration/timing/severity/associated sxs/prior Treatment) HPI Comments: Patient is a 34 year old female past medical history significant for end-stage renal disease on dialysis presenting to the emergency department for routine dialysis. Patient does not have an outpatient dialysis center and has been coming to the emergency department and hospital for her routine dialysis over the last several months. She states she has not had any Tylenol since since June 30. She states she's had increased shortness of breath for 2 days without chest pain, fever, vomiting, diarrhea, abdominal pain, leg swelling. Denies any changes to her minimal urine output. She states this shortness of breath feels typical for when she is overdue for dialysis. No modifying factors identified.  Patient is a 34 y.o. female presenting with shortness of breath.  Shortness of Breath Associated symptoms: no chest pain     Past Medical History  Diagnosis Date  . Renal disorder   . Lupus   . Hypertension    Past Surgical History  Procedure Laterality Date  . Arteriovenous graft placement     History reviewed. No pertinent family history. History  Substance Use Topics  . Smoking status: Current Every Day Smoker -- 1.00 packs/day    Types: Cigarettes  . Smokeless tobacco: Not on file  . Alcohol Use: No   OB History    No data available     Review of Systems  Respiratory: Positive for shortness of breath.   Cardiovascular: Negative for chest pain and leg swelling.  All other systems reviewed and are negative.     Allergies  Ativan; Geodon; Keflex; and Other  Home Medications   Prior to Admission medications   Medication Sig Start Date End Date Taking? Authorizing Provider   acetaminophen (TYLENOL) 500 MG tablet Take 500 mg by mouth every 6 (six) hours as needed for mild pain.   Yes Historical Provider, MD  calcium acetate (PHOSLO) 667 MG capsule Take 667 mg by mouth 3 (three) times daily with meals.   Yes Historical Provider, MD  colchicine 0.6 MG tablet Take 0.6 mg by mouth every other day.   Yes Historical Provider, MD  haloperidol (HALDOL) 5 MG tablet Take 5 mg by mouth 2 (two) times daily.   Yes Historical Provider, MD  hydrALAZINE (APRESOLINE) 25 MG tablet Take 25 mg by mouth 3 (three) times daily.   Yes Historical Provider, MD  ibuprofen (ADVIL,MOTRIN) 200 MG tablet Take 400 mg by mouth every 8 (eight) hours as needed for mild pain.   Yes Historical Provider, MD  isosorbide mononitrate (IMDUR) 30 MG 24 hr tablet Take 30 mg by mouth daily. 05/25/14  Yes Historical Provider, MD  metoprolol tartrate (LOPRESSOR) 25 MG tablet Take 25 mg by mouth 2 (two) times daily.   Yes Historical Provider, MD   BP 139/94 mmHg  Pulse 90  Temp(Src) 98.1 F (36.7 C) (Oral)  Resp 22  Ht 5\' 7"  (1.702 m)  Wt 140 lb (63.504 kg)  BMI 21.92 kg/m2  SpO2 100% Physical Exam  Constitutional: She is oriented to person, place, and time. She appears well-developed and well-nourished. No distress.  Chronically ill-appearing   HENT:  Head: Normocephalic and atraumatic.  Right Ear: External ear normal.  Left Ear: External ear normal.  Nose: Nose normal.  Mouth/Throat: Oropharynx  is clear and moist.  Eyes: Conjunctivae are normal.  Neck: Normal range of motion. Neck supple.  No nuchal rigidity.   Cardiovascular: Normal rate.   Pulmonary/Chest: Effort normal and breath sounds normal. She exhibits no tenderness.  Abdominal: Soft. There is no tenderness.  Genitourinary: Rectal exam shows external hemorrhoid.  Brown stool on DRE  Musculoskeletal: Normal range of motion.  Left AV fistula with good thrill  Neurological: She is alert and oriented to person, place, and time.  Skin: Skin  is warm and dry. She is not diaphoretic.  Psychiatric: She has a normal mood and affect.  Nursing note and vitals reviewed.   ED Course  Procedures (including critical care time) Medications  Darbepoetin Alfa (ARANESP) injection 200 mcg (not administered)  ferric gluconate (NULECIT) 250 mg in sodium chloride 0.9 % 100 mL IVPB (not administered)  0.9 %  sodium chloride infusion (not administered)  hydrocortisone (ANUSOL-HC) suppository 25 mg (not administered)  0.9 %  sodium chloride infusion (not administered)    Labs Review Labs Reviewed  BASIC METABOLIC PANEL - Abnormal; Notable for the following:    Sodium 132 (*)    Potassium 5.9 (*)    Chloride 96 (*)    CO2 20 (*)    BUN 91 (*)    Creatinine, Ser 13.78 (*)    GFR calc non Af Amer 3 (*)    GFR calc Af Amer 4 (*)    Anion gap 16 (*)    All other components within normal limits  CBC WITH DIFFERENTIAL/PLATELET - Abnormal; Notable for the following:    RBC 2.22 (*)    Hemoglobin 5.6 (*)    HCT 17.5 (*)    MCH 25.2 (*)    RDW 23.1 (*)    Monocytes Relative 13 (*)    All other components within normal limits  HEPATIC FUNCTION PANEL - Abnormal; Notable for the following:    Albumin 2.3 (*)    ALT 9 (*)    Bilirubin, Direct <0.1 (*)    All other components within normal limits  RETICULOCYTES - Abnormal; Notable for the following:    Retic Ct Pct 3.5 (*)    RBC. 2.18 (*)    All other components within normal limits  VITAMIN B12  FOLATE  IRON AND TIBC  FERRITIN  POC OCCULT BLOOD, ED  PREPARE RBC (CROSSMATCH)  TYPE AND SCREEN  PREPARE RBC (CROSSMATCH)    Imaging Review No results found.   EKG Interpretation None      9:43 AM Discussed with Dr. Lorrene Reid, will place dialysis orders, requesting potassium levels.   Discussed low Hgb and Hct results with patient who denies any melena, bright red blood per rectum, hematemesis, hemoptysis. Does endorse external hemorrhoid. Does endorse history of anemia. Family  medicine consulted and will admit the patient.   CRITICAL CARE Performed by: Baron Sane L   Total critical care time: 35 minutes  Critical care time was exclusive of separately billable procedures and treating other patients.  Critical care was necessary to treat or prevent imminent or life-threatening deterioration.  Critical care was time spent personally by me on the following activities: development of treatment plan with patient and/or surrogate as well as nursing, discussions with consultants, evaluation of patient's response to treatment, examination of patient, obtaining history from patient or surrogate, ordering and performing treatments and interventions, ordering and review of laboratory studies, ordering and review of radiographic studies, pulse oximetry and re-evaluation of patient's condition.  MDM  Final diagnoses:  ESRD on dialysis  Symptomatic anemia   Filed Vitals:   10/09/14 1500  BP: 154/97  Pulse: 102  Temp:   Resp: 24   Afebrile, NAD, non-toxic appearing, AAOx4.   I have reviewed nursing notes, vital signs, and all appropriate lab and imaging results if ordered as above.   Patient presenting with two days of SOB. Patient is well appearing on examination, no signs of respiratory distress. No hypoxia. Lungs clear to auscultation bilaterally. No lower extremity edema. Hemoglobin noted at 5.6. Hemoccult-negative. Likely related to chronic anemia. Anemia panel sent. 1 unit of PRBC ordered. Nephrology consulted for dialysis, BUN and Creatinine and K elevated, likely secondary to need for dialysis. Patient will be admitted to family practice for further evaluation of symptomatic anemia. Patient d/w with Dr. Zenia Resides, agrees with plan.     Baron Sane, PA-C 10/09/14 1512  Lacretia Leigh, MD 10/13/14 [phone]

## 2014-10-09 NOTE — Progress Notes (Signed)
Patient c/o headache,also was asking about her blood pressure medicines.MD on call notified,order placed in epic. Sara [name], [name], Therapist, sports

## 2014-10-10 DIAGNOSIS — N186 End stage renal disease: Secondary | ICD-10-CM | POA: Diagnosis not present

## 2014-10-10 DIAGNOSIS — D649 Anemia, unspecified: Secondary | ICD-10-CM | POA: Diagnosis not present

## 2014-10-10 DIAGNOSIS — Z992 Dependence on renal dialysis: Secondary | ICD-10-CM | POA: Diagnosis not present

## 2014-10-10 LAB — TYPE AND SCREEN
ABO/RH(D): O POS
ANTIBODY SCREEN: POSITIVE
DAT, IgG: POSITIVE
DONOR AG TYPE: NEGATIVE
Donor AG Type: NEGATIVE
Donor AG Type: NEGATIVE
UNIT DIVISION: 0
UNIT DIVISION: 0
Unit division: 0

## 2014-10-10 LAB — CBC
HEMATOCRIT: 23.8 % — AB (ref 36.0–46.0)
Hemoglobin: 7.7 g/dL — ABNORMAL LOW (ref 12.0–15.0)
MCH: 27.4 pg (ref 26.0–34.0)
MCHC: 32.4 g/dL (ref 30.0–36.0)
MCV: 84.7 fL (ref 78.0–100.0)
Platelets: 273 10*3/uL (ref 150–400)
RBC: 2.81 MIL/uL — AB (ref 3.87–5.11)
RDW: 21.5 % — ABNORMAL HIGH (ref 11.5–15.5)
WBC: 5.2 10*3/uL (ref 4.0–10.5)

## 2014-10-10 LAB — BASIC METABOLIC PANEL
Anion gap: 12 (ref 5–15)
BUN: 32 mg/dL — AB (ref 6–20)
CHLORIDE: 99 mmol/L — AB (ref 101–111)
CO2: 27 mmol/L (ref 22–32)
CREATININE: 6.85 mg/dL — AB (ref 0.44–1.00)
Calcium: 9.1 mg/dL (ref 8.9–10.3)
GFR calc Af Amer: 8 mL/min — ABNORMAL LOW (ref >60)
GFR calc non Af Amer: 7 mL/min — ABNORMAL LOW (ref >60)
Glucose, Bld: 76 mg/dL (ref 65–99)
Potassium: 4.7 mmol/L (ref 3.5–5.1)
Sodium: 138 mmol/L (ref 135–145)

## 2014-10-10 LAB — MAGNESIUM: MAGNESIUM: 2.1 mg/dL (ref 1.7–2.4)

## 2014-10-10 NOTE — Progress Notes (Signed)
Pt left against medical advice, pt did not sign AMA paper. MD aware.

## 2014-10-10 NOTE — Discharge Summary (Signed)
Physician Discharge Summary  Patient ID: Sara [name] MRN: [id number] DOB/AGE: 1980-07-22 35 y.o.  Admit date: 10/09/2014 Discharge date: 10/10/2014 (patient left AMA)  Admission Diagnoses: Fluid overload in the setting of missing HD.  Discharge Diagnoses:  Active Problems:   Anemia   ESRD on dialysis   Symptomatic anemia   Hypertension   Systolic CHF   ACD   Bipolar disorder   Schizophrenia   Tobacco  Discharged Condition: Patient left AMA, and I haven't got a chance to see her.  Hospital Course:  Sara [name] is a 34 y.o. female who presents with SOB in the setting of missing her HD, found to be anemic to 5.6.  Volume overload/ESRD: patient was symptomatic with dyspnea, however denies cough or edema. Noted to have periorbital swelling on exam (with no pan with EOM or conjucntval injection). Received HD without complication.   Systolic CHF: Denies current chest pain. 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 TTE from 08/2014 with EF 40-45% with inferolateral hypokinesis and improving effusion.   Anemia of chronic renal disease - Hgb at 5.6 (likely decreased 2/2 to volume) b/l~ 7. FOBT negative. Received 2u of pRBCs without complication. Hgb rose to 7.7.  Other chronic conditions were stable  Consults: nephrology  Significant Diagnostic Studies: none   Discharge Exam: Blood pressure 128/86, pulse 104, temperature 99.2 F (37.3 C), temperature source Oral, resp. rate 20, height 5\' 7"  (1.702 m), weight 140 lb 10.5 oz (63.8 kg), SpO2 99 %.   Physical exam: Patient left AMA before I see her.  Disposition: 07-Left Against Medical Advice     Medication List    ASK your doctor about these medications        acetaminophen 500 MG tablet  Commonly known as:  TYLENOL  Take 500 mg by mouth every 6 (six) hours as needed for mild pain.     calcium acetate 667 MG capsule  Commonly known as:  PHOSLO  Take 667 mg by mouth 3 (three)  times daily with meals.     colchicine 0.6 MG tablet  Take 0.6 mg by mouth every other day.     haloperidol 5 MG tablet  Commonly known as:  HALDOL  Take 5 mg by mouth 2 (two) times daily.     hydrALAZINE 25 MG tablet  Commonly known as:  APRESOLINE  Take 25 mg by mouth 3 (three) times daily.     ibuprofen 200 MG tablet  Commonly known as:  ADVIL,MOTRIN  Take 400 mg by mouth every 8 (eight) hours as needed for mild pain.     isosorbide mononitrate 30 MG 24 hr tablet  Commonly known as:  IMDUR  Take 30 mg by mouth daily.     metoprolol tartrate 25 MG tablet  Commonly known as:  LOPRESSOR  Take 25 mg by mouth 2 (two) times daily.        Signed: Mercy Riding 10/10/2014, 6:03 PM

## 2014-10-11 ENCOUNTER — Encounter (HOSPITAL_COMMUNITY): Payer: Self-pay | Admitting: Nurse Practitioner

## 2014-10-11 ENCOUNTER — Non-Acute Institutional Stay (HOSPITAL_COMMUNITY)
Admission: EM | Admit: 2014-10-11 | Discharge: 2014-10-11 | Discharge status: Against Medical Advice | Payer: Medicare Other | Attending: Emergency Medicine | Admitting: Emergency Medicine

## 2014-10-11 DIAGNOSIS — F209 Schizophrenia, unspecified: Secondary | ICD-10-CM | POA: Insufficient documentation

## 2014-10-11 DIAGNOSIS — Z79899 Other long term (current) drug therapy: Secondary | ICD-10-CM | POA: Insufficient documentation

## 2014-10-11 DIAGNOSIS — Z992 Dependence on renal dialysis: Secondary | ICD-10-CM | POA: Diagnosis not present

## 2014-10-11 DIAGNOSIS — K644 Residual hemorrhoidal skin tags: Secondary | ICD-10-CM | POA: Insufficient documentation

## 2014-10-11 DIAGNOSIS — M3214 Glomerular disease in systemic lupus erythematosus: Secondary | ICD-10-CM | POA: Insufficient documentation

## 2014-10-11 DIAGNOSIS — I120 Hypertensive chronic kidney disease with stage 5 chronic kidney disease or end stage renal disease: Secondary | ICD-10-CM | POA: Insufficient documentation

## 2014-10-11 DIAGNOSIS — D649 Anemia, unspecified: Secondary | ICD-10-CM | POA: Insufficient documentation

## 2014-10-11 DIAGNOSIS — Z881 Allergy status to other antibiotic agents status: Secondary | ICD-10-CM | POA: Diagnosis not present

## 2014-10-11 DIAGNOSIS — F17210 Nicotine dependence, cigarettes, uncomplicated: Secondary | ICD-10-CM | POA: Insufficient documentation

## 2014-10-11 DIAGNOSIS — K625 Hemorrhage of anus and rectum: Secondary | ICD-10-CM | POA: Diagnosis not present

## 2014-10-11 DIAGNOSIS — F29 Unspecified psychosis not due to a substance or known physiological condition: Secondary | ICD-10-CM | POA: Diagnosis not present

## 2014-10-11 DIAGNOSIS — F319 Bipolar disorder, unspecified: Secondary | ICD-10-CM | POA: Diagnosis not present

## 2014-10-11 DIAGNOSIS — N186 End stage renal disease: Secondary | ICD-10-CM | POA: Diagnosis not present

## 2014-10-11 DIAGNOSIS — Z888 Allergy status to other drugs, medicaments and biological substances status: Secondary | ICD-10-CM | POA: Diagnosis not present

## 2014-10-11 DIAGNOSIS — M545 Low back pain: Secondary | ICD-10-CM | POA: Insufficient documentation

## 2014-10-11 LAB — I-STAT CHEM 8, ED
BUN: 46 mg/dL — ABNORMAL HIGH (ref 6–20)
CREATININE: 9.1 mg/dL — AB (ref 0.44–1.00)
Calcium, Ion: 1.15 mmol/L (ref 1.12–1.23)
Chloride: 99 mmol/L — ABNORMAL LOW (ref 101–111)
Glucose, Bld: 82 mg/dL (ref 65–99)
HEMATOCRIT: 29 % — AB (ref 36.0–46.0)
Hemoglobin: 9.9 g/dL — ABNORMAL LOW (ref 12.0–15.0)
POTASSIUM: 4.5 mmol/L (ref 3.5–5.1)
Sodium: 136 mmol/L (ref 135–145)
TCO2: 25 mmol/L (ref 0–100)

## 2014-10-11 LAB — POC OCCULT BLOOD, ED: Fecal Occult Bld: POSITIVE — AB
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
## 2014-10-11 NOTE — ED Notes (Signed)
Pt here for routine dialysis M/W/F last dialysis was Monday. Patient also c/o bleeding hemorrhoids this morning when having BM. No bleeding at present time.

## 2014-10-11 NOTE — ED Notes (Signed)
Pt left AMA told secretary that she will return tomorrow.

## 2014-10-11 NOTE — ED Notes (Signed)
Dr Ray at bedside. 

## 2014-10-12 ENCOUNTER — Telehealth: Payer: Self-pay | Admitting: *Deleted

## 2014-10-12 ENCOUNTER — Encounter (HOSPITAL_COMMUNITY): Payer: Self-pay | Admitting: *Deleted

## 2014-10-12 ENCOUNTER — Emergency Department (HOSPITAL_COMMUNITY)
Admission: EM | Admit: 2014-10-12 | Discharge: 2014-10-12 | Discharge status: Against Medical Advice | Payer: Medicare Other | Attending: Emergency Medicine | Admitting: Emergency Medicine

## 2014-10-12 DIAGNOSIS — Z720 Tobacco use: Secondary | ICD-10-CM | POA: Insufficient documentation

## 2014-10-12 DIAGNOSIS — N184 Chronic kidney disease, stage 4 (severe): Secondary | ICD-10-CM | POA: Diagnosis not present

## 2014-10-12 DIAGNOSIS — Z8739 Personal history of other diseases of the musculoskeletal system and connective tissue: Secondary | ICD-10-CM | POA: Diagnosis not present

## 2014-10-12 DIAGNOSIS — Z992 Dependence on renal dialysis: Secondary | ICD-10-CM | POA: Diagnosis not present

## 2014-10-12 DIAGNOSIS — F29 Unspecified psychosis not due to a substance or known physiological condition: Secondary | ICD-10-CM | POA: Diagnosis not present

## 2014-10-12 DIAGNOSIS — Z79899 Other long term (current) drug therapy: Secondary | ICD-10-CM | POA: Diagnosis not present

## 2014-10-12 DIAGNOSIS — R609 Edema, unspecified: Secondary | ICD-10-CM | POA: Insufficient documentation

## 2014-10-12 DIAGNOSIS — Z862 Personal history of diseases of the blood and blood-forming organs and certain disorders involving the immune mechanism: Secondary | ICD-10-CM | POA: Diagnosis not present

## 2014-10-12 DIAGNOSIS — I129 Hypertensive chronic kidney disease with stage 1 through stage 4 chronic kidney disease, or unspecified chronic kidney disease: Secondary | ICD-10-CM | POA: Insufficient documentation

## 2014-10-12 DIAGNOSIS — Z4931 Encounter for adequacy testing for hemodialysis: Secondary | ICD-10-CM | POA: Diagnosis present

## 2014-10-12 LAB — BASIC METABOLIC PANEL
Anion gap: 15 (ref 5–15)
BUN: 63 mg/dL — ABNORMAL HIGH (ref 6–20)
CO2: 21 mmol/L — ABNORMAL LOW (ref 22–32)
Calcium: 8.8 mg/dL — ABNORMAL LOW (ref 8.9–10.3)
Chloride: 98 mmol/L — ABNORMAL LOW (ref 101–111)
Creatinine, Ser: 11.6 mg/dL — ABNORMAL HIGH (ref 0.44–1.00)
GFR calc Af Amer: 4 mL/min — ABNORMAL LOW (ref >60)
GFR calc non Af Amer: 4 mL/min — ABNORMAL LOW (ref >60)
Glucose, Bld: 79 mg/dL (ref 65–99)
Potassium: 5.4 mmol/L — ABNORMAL HIGH (ref 3.5–5.1)
Sodium: 134 mmol/L — ABNORMAL LOW (ref 135–145)

## 2014-10-12 LAB — CBC WITH DIFFERENTIAL/PLATELET
Basophils Absolute: 0 10*3/uL (ref 0.0–0.1)
Basophils Relative: 0 % (ref 0–1)
Eosinophils Absolute: 0.1 10*3/uL (ref 0.0–0.7)
Eosinophils Relative: 1 % (ref 0–5)
HCT: 23.3 % — ABNORMAL LOW (ref 36.0–46.0)
Hemoglobin: 7.4 g/dL — ABNORMAL LOW (ref 12.0–15.0)
Lymphocytes Relative: 24 % (ref 12–46)
Lymphs Abs: 1.4 10*3/uL (ref 0.7–4.0)
MCH: 26.5 pg (ref 26.0–34.0)
MCHC: 31.8 g/dL (ref 30.0–36.0)
MCV: 83.5 fL (ref 78.0–100.0)
Monocytes Absolute: 0.8 10*3/uL (ref 0.1–1.0)
Monocytes Relative: 13 % — ABNORMAL HIGH (ref 3–12)
Neutro Abs: 3.5 10*3/uL (ref 1.7–7.7)
Neutrophils Relative %: 62 % (ref 43–77)
Platelets: 293 10*3/uL (ref 150–400)
RBC: 2.79 MIL/uL — ABNORMAL LOW (ref 3.87–5.11)
RDW: 22.4 % — ABNORMAL HIGH (ref 11.5–15.5)
WBC: 5.8 10*3/uL (ref 4.0–10.5)

## 2014-10-12 NOTE — ED Notes (Signed)
Pt presents for HD, last received full treatment on Monday -was admitted for low hemoglobin; reports coming yesterday, but had to wait all day and left; pt denies any discomfort.

## 2014-10-12 NOTE — ED Notes (Addendum)
Pt out to desk stating that she is not willing to wait to be dialyzed. States she will come back in the morning.

## 2014-10-12 NOTE — ED Notes (Signed)
Meal tray ordered 

## 2014-10-12 NOTE — Telephone Encounter (Signed)
-----   Message from Smiley Houseman, MD sent at 10/11/2014  5:25 PM EDT ----- Patient has not been seen in clinic since 2014. She would greatly benefit from being seen in clinic.  Please call her to schedule an appointment.

## 2014-10-12 NOTE — ED Notes (Signed)
Meal tray at bedside.  
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
## 2014-10-12 NOTE — ED Notes (Signed)
Pt walked out door; would not answer RN when asked if she was coming back.

## 2014-10-12 NOTE — Telephone Encounter (Signed)
LM for patient to call back.  Please help her schedule an appt to be seen in clinic.  Preferrably by her PCP but if nothing available than someone on the team for ED follow up. Arias Weinert,CMA

## 2014-10-12 NOTE — ED Notes (Signed)
Pt returned.

## 2014-10-12 NOTE — ED Notes (Signed)
Pt states is here for regular dialysis.  Has not been dialyized since Monday.  No other complaints.

## 2014-10-14 ENCOUNTER — Non-Acute Institutional Stay (HOSPITAL_COMMUNITY)
Admission: EM | Admit: 2014-10-14 | Discharge: 2014-10-14 | Disposition: A | Discharge status: Home / Self Care | Payer: Medicare Other | Attending: Emergency Medicine | Admitting: Emergency Medicine

## 2014-10-14 ENCOUNTER — Encounter (HOSPITAL_COMMUNITY): Payer: Self-pay | Admitting: Emergency Medicine

## 2014-10-14 DIAGNOSIS — N186 End stage renal disease: Secondary | ICD-10-CM | POA: Diagnosis not present

## 2014-10-14 DIAGNOSIS — F17210 Nicotine dependence, cigarettes, uncomplicated: Secondary | ICD-10-CM | POA: Diagnosis not present

## 2014-10-14 DIAGNOSIS — Z992 Dependence on renal dialysis: Secondary | ICD-10-CM | POA: Insufficient documentation

## 2014-10-14 DIAGNOSIS — M545 Low back pain: Secondary | ICD-10-CM | POA: Insufficient documentation

## 2014-10-14 DIAGNOSIS — I120 Hypertensive chronic kidney disease with stage 5 chronic kidney disease or end stage renal disease: Secondary | ICD-10-CM | POA: Insufficient documentation

## 2014-10-14 DIAGNOSIS — Z79899 Other long term (current) drug therapy: Secondary | ICD-10-CM | POA: Diagnosis not present

## 2014-10-14 DIAGNOSIS — M3214 Glomerular disease in systemic lupus erythematosus: Secondary | ICD-10-CM | POA: Insufficient documentation

## 2014-10-14 DIAGNOSIS — D649 Anemia, unspecified: Secondary | ICD-10-CM | POA: Insufficient documentation

## 2014-10-14 DIAGNOSIS — F319 Bipolar disorder, unspecified: Secondary | ICD-10-CM | POA: Diagnosis not present

## 2014-10-14 DIAGNOSIS — Z888 Allergy status to other drugs, medicaments and biological substances status: Secondary | ICD-10-CM | POA: Insufficient documentation

## 2014-10-14 DIAGNOSIS — Z881 Allergy status to other antibiotic agents status: Secondary | ICD-10-CM | POA: Insufficient documentation

## 2014-10-14 DIAGNOSIS — F209 Schizophrenia, unspecified: Secondary | ICD-10-CM | POA: Diagnosis not present

## 2014-10-14 LAB — RENAL FUNCTION PANEL
ALBUMIN: 2.4 g/dL — AB (ref 3.5–5.0)
ANION GAP: 14 (ref 5–15)
BUN: 82 mg/dL — ABNORMAL HIGH (ref 6–20)
CHLORIDE: 100 mmol/L — AB (ref 101–111)
CO2: 22 mmol/L (ref 22–32)
Calcium: 8.9 mg/dL (ref 8.9–10.3)
Creatinine, Ser: 14 mg/dL — ABNORMAL HIGH (ref 0.44–1.00)
GFR calc non Af Amer: 3 mL/min — ABNORMAL LOW (ref >60)
GFR, EST AFRICAN AMERICAN: 3 mL/min — AB (ref >60)
Glucose, Bld: 80 mg/dL (ref 65–99)
POTASSIUM: 5.6 mmol/L — AB (ref 3.5–5.1)
Phosphorus: 11.8 mg/dL — ABNORMAL HIGH (ref 2.5–4.6)
SODIUM: 136 mmol/L (ref 135–145)

## 2014-10-14 LAB — CBC
HCT: 21.7 % — ABNORMAL LOW (ref 36.0–46.0)
Hemoglobin: 6.9 g/dL — CL (ref 12.0–15.0)
MCH: 26.8 pg (ref 26.0–34.0)
MCHC: 31.8 g/dL (ref 30.0–36.0)
MCV: 84.4 fL (ref 78.0–100.0)
PLATELETS: 302 10*3/uL (ref 150–400)
RBC: 2.57 MIL/uL — ABNORMAL LOW (ref 3.87–5.11)
RDW: 22.6 % — ABNORMAL HIGH (ref 11.5–15.5)
WBC: 5.8 10*3/uL (ref 4.0–10.5)

## 2014-10-14 LAB — PREPARE RBC (CROSSMATCH)

## 2014-10-14 MED ORDER — SODIUM CHLORIDE 0.9 % IV SOLN: 100.0000 mL | INTRAVENOUS | Status: DC | PRN

## 2014-10-14 MED ORDER — PENTAFLUOROPROP-TETRAFLUOROETH EX AERO: 1.0000 "application " | INHALATION_SPRAY | CUTANEOUS | Status: DC | PRN

## 2014-10-14 MED ORDER — ALTEPLASE 2 MG IJ SOLR: 2.0000 mg | Freq: Once | INTRAMUSCULAR | Status: DC | PRN

## 2014-10-14 MED ORDER — HEPARIN SODIUM (PORCINE) 1000 UNIT/ML DIALYSIS: 100.0000 [IU]/kg | INTRAMUSCULAR | Status: DC | PRN

## 2014-10-14 MED ORDER — LIDOCAINE-PRILOCAINE 2.5-2.5 % EX CREA: 1.0000 "application " | TOPICAL_CREAM | CUTANEOUS | Status: DC | PRN

## 2014-10-14 MED ORDER — LIDOCAINE HCL (PF) 1 % IJ SOLN: 5.0000 mL | INTRAMUSCULAR | Status: DC | PRN

## 2014-10-14 MED ORDER — HEPARIN SODIUM (PORCINE) 1000 UNIT/ML DIALYSIS: 1000.0000 [IU] | INTRAMUSCULAR | Status: DC | PRN

## 2014-10-14 MED ORDER — NEPRO/CARBSTEADY PO LIQD: 237.0000 mL | ORAL | Status: DC | PRN

## 2014-10-14 MED ORDER — SODIUM CHLORIDE 0.9 % IV SOLN: Freq: Once | INTRAVENOUS | Status: DC

## 2014-10-14 MED ORDER — SODIUM CHLORIDE 0.9 % IV SOLN
510.0000 mg | Freq: Once | INTRAVENOUS | Status: AC
  Administered 2014-10-14: 510 mg via INTRAVENOUS
  Filled 2014-10-14: qty 17
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
## 2014-10-14 NOTE — Procedures (Signed)
Patient has no outpt HD unit. Here for HD. No complaints.   Last HD Mond (today is Sat). Volume up, 10 kg over dry wt which is around 58-59kg. BP's good.   Main issue recently is anemia with Hb's low in the 5-7 range, got 2u prbc's on 6/27  Tsat 7% on 6/25, has not rec'd any iron > 3 mos-- will again attempt to give 500 mg feraheme today w HD (was ordered 6/18 but not given and hasn't rec'd any other IV Fe since).   Darbe 200 on 6/21 Darbe 200 on 5/25 Darbe 60 on 4/25 Darbe 200 on 4/19  Old orders from [facility] Feb 2016 > 4 hrs59kg 2K/2CaHeparin 6000AVF R  I was present at this dialysis session, have reviewed the session itself and made  appropriate changes  Kelly Splinter MD (pgr) [phone]    ([phone] 10/14/2014, 12:15 PM

## 2014-10-14 NOTE — ED Notes (Signed)
Pt here for Sat dialysis.  Pt in NAD, ambulatory, denies pain or other complaints.

## 2014-10-14 NOTE — Progress Notes (Signed)
CRITICAL VALUE ALERT  Critical value received:  Hgb 6.9  Date of notification:  10/14/14 Time of notification:  1287 Critical value read back:yes  Nurse who received alert:  Louretta Shorten RN  MD notified (1st page):  NP on unit  Time of first page:  35  MD notified (2nd page):  Time of second page:  Responding MD:  Shelle Iron NP  Time MD responded:  1325   Order to send down type and screen. Per pt history, pt has antibodies so screening time will not allow pt to receive blood during this HD treatment. Plan to give 2 units prbcs on Monday 10/16/14 per NP. Blood bank bracelet applied to L Arm, pt instructed to not take off under any circumstances or she will not be able to receive blood and will have to be typed and screened again. Pt states understanding. Blood bank also notified of plan. Continue to monitor patient.

## 2014-10-16 ENCOUNTER — Encounter (HOSPITAL_COMMUNITY): Payer: Self-pay | Admitting: Nurse Practitioner

## 2014-10-16 ENCOUNTER — Non-Acute Institutional Stay (HOSPITAL_COMMUNITY)
Admission: EM | Admit: 2014-10-16 | Discharge: 2014-10-16 | Disposition: A | Discharge status: Home / Self Care | Payer: Medicare Other | Attending: Emergency Medicine | Admitting: Emergency Medicine

## 2014-10-16 DIAGNOSIS — M545 Low back pain, unspecified: Secondary | ICD-10-CM

## 2014-10-16 DIAGNOSIS — I120 Hypertensive chronic kidney disease with stage 5 chronic kidney disease or end stage renal disease: Secondary | ICD-10-CM | POA: Insufficient documentation

## 2014-10-16 DIAGNOSIS — N186 End stage renal disease: Secondary | ICD-10-CM | POA: Diagnosis not present

## 2014-10-16 DIAGNOSIS — F17210 Nicotine dependence, cigarettes, uncomplicated: Secondary | ICD-10-CM | POA: Insufficient documentation

## 2014-10-16 DIAGNOSIS — M329 Systemic lupus erythematosus, unspecified: Secondary | ICD-10-CM | POA: Insufficient documentation

## 2014-10-16 DIAGNOSIS — D631 Anemia in chronic kidney disease: Secondary | ICD-10-CM | POA: Insufficient documentation

## 2014-10-16 DIAGNOSIS — Z992 Dependence on renal dialysis: Secondary | ICD-10-CM | POA: Diagnosis not present

## 2014-10-16 LAB — IRON AND TIBC
Iron: 73 ug/dL (ref 28–170)
SATURATION RATIOS: 31 % (ref 10.4–31.8)
TIBC: 234 ug/dL — AB (ref 250–450)
UIBC: 161 ug/dL

## 2014-10-16 LAB — CBC WITH DIFFERENTIAL/PLATELET
BASOS ABS: 0 10*3/uL (ref 0.0–0.1)
BASOS PCT: 1 % (ref 0–1)
EOS ABS: 0 10*3/uL (ref 0.0–0.7)
Eosinophils Relative: 1 % (ref 0–5)
HCT: 26.1 % — ABNORMAL LOW (ref 36.0–46.0)
Hemoglobin: 8.1 g/dL — ABNORMAL LOW (ref 12.0–15.0)
LYMPHS ABS: 1.3 10*3/uL (ref 0.7–4.0)
Lymphocytes Relative: 27 % (ref 12–46)
MCH: 26.8 pg (ref 26.0–34.0)
MCHC: 31 g/dL (ref 30.0–36.0)
MCV: 86.4 fL (ref 78.0–100.0)
Monocytes Absolute: 0.7 10*3/uL (ref 0.1–1.0)
Monocytes Relative: 15 % — ABNORMAL HIGH (ref 3–12)
Neutro Abs: 2.8 10*3/uL (ref 1.7–7.7)
Neutrophils Relative %: 56 % (ref 43–77)
Platelets: 252 10*3/uL (ref 150–400)
RBC: 3.02 MIL/uL — AB (ref 3.87–5.11)
RDW: 22.6 % — ABNORMAL HIGH (ref 11.5–15.5)
WBC: 4.8 10*3/uL (ref 4.0–10.5)

## 2014-10-16 LAB — BASIC METABOLIC PANEL
Anion gap: 16 — ABNORMAL HIGH (ref 5–15)
BUN: 67 mg/dL — ABNORMAL HIGH (ref 6–20)
CALCIUM: 9.4 mg/dL (ref 8.9–10.3)
CO2: 21 mmol/L — ABNORMAL LOW (ref 22–32)
CREATININE: 11.96 mg/dL — AB (ref 0.44–1.00)
Chloride: 98 mmol/L — ABNORMAL LOW (ref 101–111)
GFR calc Af Amer: 4 mL/min — ABNORMAL LOW (ref >60)
GFR, EST NON AFRICAN AMERICAN: 4 mL/min — AB (ref >60)
Glucose, Bld: 68 mg/dL (ref 65–99)
POTASSIUM: 5.6 mmol/L — AB (ref 3.5–5.1)
SODIUM: 135 mmol/L (ref 135–145)

## 2014-10-16 MED ORDER — SODIUM CHLORIDE 0.9 % IV SOLN: 100.0000 mL | INTRAVENOUS | Status: DC | PRN

## 2014-10-16 MED ORDER — LIDOCAINE-PRILOCAINE 2.5-2.5 % EX CREA
1.0000 "application " | TOPICAL_CREAM | CUTANEOUS | Status: DC | PRN
  Filled 2014-10-16: qty 5

## 2014-10-16 MED ORDER — ALTEPLASE 2 MG IJ SOLR
2.0000 mg | Freq: Once | INTRAMUSCULAR | Status: DC | PRN
  Filled 2014-10-16: qty 2

## 2014-10-16 MED ORDER — PENTAFLUOROPROP-TETRAFLUOROETH EX AERO
1.0000 "application " | INHALATION_SPRAY | CUTANEOUS | Status: DC | PRN
  Filled 2014-10-16: qty 30

## 2014-10-16 MED ORDER — SODIUM CHLORIDE 0.9 % IV SOLN: Freq: Once | INTRAVENOUS | Status: DC

## 2014-10-16 MED ORDER — NEPRO/CARBSTEADY PO LIQD
237.0000 mL | ORAL | Status: DC | PRN
  Filled 2014-10-16: qty 237

## 2014-10-16 MED ORDER — ACETAMINOPHEN 325 MG PO TABS
650.0000 mg | ORAL_TABLET | Freq: Four times a day (QID) | ORAL | Status: DC | PRN
  Administered 2014-10-16: 650 mg via ORAL

## 2014-10-16 MED ORDER — HEPARIN SODIUM (PORCINE) 1000 UNIT/ML DIALYSIS
1000.0000 [IU] | INTRAMUSCULAR | Status: DC | PRN
  Filled 2014-10-16: qty 1

## 2014-10-16 MED ORDER — LIDOCAINE HCL (PF) 1 % IJ SOLN: 5.0000 mL | INTRAMUSCULAR | Status: DC | PRN

## 2014-10-16 MED ORDER — DARBEPOETIN ALFA 200 MCG/0.4ML IJ SOSY
PREFILLED_SYRINGE | INTRAMUSCULAR | Status: AC
  Administered 2014-10-16: 200 ug via INTRAVENOUS
  Filled 2014-10-16: qty 0.4

## 2014-10-16 MED ORDER — DARBEPOETIN ALFA 200 MCG/0.4ML IJ SOSY
200.0000 ug | PREFILLED_SYRINGE | INTRAMUSCULAR | Status: DC
  Administered 2014-10-16: 200 ug via INTRAVENOUS
  Filled 2014-10-16: qty 0.4

## 2014-10-16 MED ORDER — ACETAMINOPHEN 325 MG PO TABS
ORAL_TABLET | ORAL | Status: AC
  Filled 2014-10-16: qty 2

## 2014-10-16 MED ORDER — HEPARIN SODIUM (PORCINE) 1000 UNIT/ML DIALYSIS
20.0000 [IU]/kg | INTRAMUSCULAR | Status: DC | PRN
  Filled 2014-10-16: qty 2
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
## 2014-10-16 NOTE — ED Notes (Signed)
Left ED for dialysis at 1542; in dialysis, moved name back to ED in Epic only for dialysis nurse to find her in [place] as she could not find her in [facility].

## 2014-10-16 NOTE — ED Notes (Signed)
I gave the patient a cup of ice water. 

## 2014-10-16 NOTE — Procedures (Signed)
Anemia Meds R [name] (has two charts [id number], [id number]):  DateAranespIV iron   Tsat   7/9/16500 mg feraheme 7/4/16200 ug250 mg Nulecit  17% 10/04/14 100 ug 09/30/14       7 % 6/21/16200 ug 6/14/16200 ug 6/11/16500 mg feraheme 6/9/16200 ug      5% 5/25/16100 ug  Sara [name] returns for dialysis. She still has no OP HD unit.  Hb was low last time here at 6.9 and prbc's were preparted for this HD, rec'g 2 u prbc's now.  Hb today was 8.1 pre transfusion , but she is getting the blood already so will not dc the order. Last admit in July she had negative FOBT.  Last tsat on 7.4 was up to 17% and she rec'd more IV Fe that day and again on 7/9.  Suspect Fe deficiency is main issue as she has been getting ESA regularly.   I was present at this dialysis session, have reviewed the session itself and made  appropriate changes Kelly Splinter MD (pgr) [phone]    ([phone] 09/23/2014, 10:31 AM

## 2014-10-16 NOTE — ED Notes (Signed)
I gave the patient a pair of blue extra large socks.

## 2014-10-16 NOTE — ED Notes (Signed)
Pt here for dialysis M/W/F typically- last dialysis was Saturday. Pt also endorses diffuse lower back pain when she woke up this morning feels it is the ways she slept. Denies tenderness, dysuria.

## 2014-10-16 NOTE — ED Notes (Signed)
Ordered a renal diet tray.

## 2014-10-17 LAB — TYPE AND SCREEN
ABO/RH(D): O POS
Antibody Screen: POSITIVE
DAT, IgG: POSITIVE
Donor AG Type: NEGATIVE
Donor AG Type: NEGATIVE
UNIT DIVISION: 0
UNIT DIVISION: 0
UNIT DIVISION: 0
Unit division: 0
Unit division: 0
Unit division: 0
Unit division: 0
Unit division: 0

## 2014-10-18 ENCOUNTER — Encounter (HOSPITAL_COMMUNITY): Payer: Self-pay | Admitting: *Deleted

## 2014-10-18 ENCOUNTER — Non-Acute Institutional Stay (HOSPITAL_COMMUNITY)
Admission: EM | Admit: 2014-10-18 | Discharge: 2014-10-18 | Discharge status: Against Medical Advice | Payer: Medicare Other | Attending: Emergency Medicine | Admitting: Emergency Medicine

## 2014-10-18 DIAGNOSIS — F319 Bipolar disorder, unspecified: Secondary | ICD-10-CM | POA: Insufficient documentation

## 2014-10-18 DIAGNOSIS — I120 Hypertensive chronic kidney disease with stage 5 chronic kidney disease or end stage renal disease: Secondary | ICD-10-CM | POA: Diagnosis present

## 2014-10-18 DIAGNOSIS — M3214 Glomerular disease in systemic lupus erythematosus: Secondary | ICD-10-CM | POA: Diagnosis not present

## 2014-10-18 DIAGNOSIS — Z888 Allergy status to other drugs, medicaments and biological substances status: Secondary | ICD-10-CM | POA: Diagnosis not present

## 2014-10-18 DIAGNOSIS — Z881 Allergy status to other antibiotic agents status: Secondary | ICD-10-CM | POA: Diagnosis not present

## 2014-10-18 DIAGNOSIS — Z79899 Other long term (current) drug therapy: Secondary | ICD-10-CM | POA: Insufficient documentation

## 2014-10-18 DIAGNOSIS — N186 End stage renal disease: Secondary | ICD-10-CM | POA: Diagnosis not present

## 2014-10-18 DIAGNOSIS — D649 Anemia, unspecified: Secondary | ICD-10-CM | POA: Insufficient documentation

## 2014-10-18 DIAGNOSIS — Z992 Dependence on renal dialysis: Secondary | ICD-10-CM | POA: Insufficient documentation

## 2014-10-18 DIAGNOSIS — M545 Low back pain: Secondary | ICD-10-CM | POA: Diagnosis not present

## 2014-10-18 DIAGNOSIS — F209 Schizophrenia, unspecified: Secondary | ICD-10-CM | POA: Insufficient documentation

## 2014-10-18 DIAGNOSIS — F17210 Nicotine dependence, cigarettes, uncomplicated: Secondary | ICD-10-CM | POA: Diagnosis not present

## 2014-10-18 LAB — I-STAT CHEM 8, ED
BUN: 50 mg/dL — AB (ref 6–20)
CALCIUM ION: 1.13 mmol/L (ref 1.12–1.23)
CHLORIDE: 101 mmol/L (ref 101–111)
CREATININE: 10.1 mg/dL — AB (ref 0.44–1.00)
Glucose, Bld: 78 mg/dL (ref 65–99)
HEMATOCRIT: 32 % — AB (ref 36.0–46.0)
HEMOGLOBIN: 10.9 g/dL — AB (ref 12.0–15.0)
POTASSIUM: 4.5 mmol/L (ref 3.5–5.1)
SODIUM: 137 mmol/L (ref 135–145)
TCO2: 23 mmol/L (ref 0–100)

## 2014-10-18 MED ORDER — AZITHROMYCIN 250 MG PO TABS
1000.0000 mg | ORAL_TABLET | Freq: Once | ORAL | Status: DC
Start: 2014-10-18 — End: 2014-10-18

## 2014-10-18 MED ORDER — CEFTRIAXONE SODIUM 250 MG IJ SOLR: 250.0000 mg | Freq: Once | INTRAMUSCULAR | Status: DC

## 2014-10-18 NOTE — ED Notes (Signed)
Pt refusing to do dialysis, pt states "I have to be at work soon, i'll just check out". Pt denies symptoms, ambulatory out of ER. Pt willing to sign AMA form. Pt states she will return in the morning. Discussed risk of leaving before having dialysis treatment, pt understanding and willing to sign out anyway.

## 2014-10-18 NOTE — ED Notes (Signed)
Spoke with Janett Billow, Agricultural consultant.  Patient will have labs done then once back will go to POD C.   No monitoring in FT, she advised okay until she goes to POD C.

## 2014-10-18 NOTE — ED Notes (Signed)
Pt reports needing dialysis, last treatment was Monday. No acute distress noted.
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
## 2014-10-18 NOTE — ED Notes (Signed)
Patient states not having any problems.   Patient states just here for dialysis.

## 2014-10-20 ENCOUNTER — Encounter (HOSPITAL_COMMUNITY): Payer: Self-pay | Admitting: *Deleted

## 2014-10-20 ENCOUNTER — Non-Acute Institutional Stay (HOSPITAL_COMMUNITY)
Admission: EM | Admit: 2014-10-20 | Discharge: 2014-10-20 | Disposition: A | Discharge status: Home / Self Care | Payer: Medicare Other | Attending: Emergency Medicine | Admitting: Emergency Medicine

## 2014-10-20 DIAGNOSIS — N186 End stage renal disease: Secondary | ICD-10-CM | POA: Diagnosis present

## 2014-10-20 DIAGNOSIS — F17210 Nicotine dependence, cigarettes, uncomplicated: Secondary | ICD-10-CM | POA: Diagnosis not present

## 2014-10-20 DIAGNOSIS — I120 Hypertensive chronic kidney disease with stage 5 chronic kidney disease or end stage renal disease: Secondary | ICD-10-CM | POA: Diagnosis not present

## 2014-10-20 DIAGNOSIS — M329 Systemic lupus erythematosus, unspecified: Secondary | ICD-10-CM | POA: Diagnosis not present

## 2014-10-20 DIAGNOSIS — I5022 Chronic systolic (congestive) heart failure: Secondary | ICD-10-CM | POA: Diagnosis not present

## 2014-10-20 DIAGNOSIS — Z992 Dependence on renal dialysis: Secondary | ICD-10-CM | POA: Diagnosis not present

## 2014-10-20 LAB — RENAL FUNCTION PANEL
ANION GAP: 15 (ref 5–15)
Albumin: 2.4 g/dL — ABNORMAL LOW (ref 3.5–5.0)
BUN: 78 mg/dL — ABNORMAL HIGH (ref 6–20)
CALCIUM: 8.8 mg/dL — AB (ref 8.9–10.3)
CO2: 22 mmol/L (ref 22–32)
Chloride: 100 mmol/L — ABNORMAL LOW (ref 101–111)
Creatinine, Ser: 13.25 mg/dL — ABNORMAL HIGH (ref 0.44–1.00)
GFR calc Af Amer: 4 mL/min — ABNORMAL LOW (ref >60)
GFR calc non Af Amer: 3 mL/min — ABNORMAL LOW (ref >60)
Glucose, Bld: 99 mg/dL (ref 65–99)
Phosphorus: 11.2 mg/dL — ABNORMAL HIGH (ref 2.5–4.6)
Potassium: 4.6 mmol/L (ref 3.5–5.1)
Sodium: 137 mmol/L (ref 135–145)

## 2014-10-20 LAB — CBC
HCT: 26.6 % — ABNORMAL LOW (ref 36.0–46.0)
HEMOGLOBIN: 8.6 g/dL — AB (ref 12.0–15.0)
MCH: 28.3 pg (ref 26.0–34.0)
MCHC: 32.3 g/dL (ref 30.0–36.0)
MCV: 87.5 fL (ref 78.0–100.0)
Platelets: 237 10*3/uL (ref 150–400)
RBC: 3.04 MIL/uL — ABNORMAL LOW (ref 3.87–5.11)
RDW: 21.6 % — AB (ref 11.5–15.5)
WBC: 4.6 10*3/uL (ref 4.0–10.5)

## 2014-10-20 LAB — HEPATITIS B SURFACE ANTIGEN: HEP B S AG: NEGATIVE

## 2014-10-20 MED ORDER — DARBEPOETIN ALFA 200 MCG/0.4ML IJ SOSY
200.0000 ug | PREFILLED_SYRINGE | Freq: Once | INTRAMUSCULAR | Status: AC
  Administered 2014-10-20: 200 ug via INTRAVENOUS

## 2014-10-20 MED ORDER — HEPARIN SODIUM (PORCINE) 1000 UNIT/ML DIALYSIS: 20.0000 [IU]/kg | INTRAMUSCULAR | Status: DC | PRN

## 2014-10-20 MED ORDER — PENTAFLUOROPROP-TETRAFLUOROETH EX AERO: 1.0000 "application " | INHALATION_SPRAY | CUTANEOUS | Status: DC | PRN

## 2014-10-20 MED ORDER — ALTEPLASE 2 MG IJ SOLR
2.0000 mg | Freq: Once | INTRAMUSCULAR | Status: DC | PRN
  Filled 2014-10-20: qty 2

## 2014-10-20 MED ORDER — ACETAMINOPHEN 325 MG PO TABS
ORAL_TABLET | ORAL | Status: AC
  Administered 2014-10-20: 650 mg
  Filled 2014-10-20: qty 2

## 2014-10-20 MED ORDER — HEPARIN SODIUM (PORCINE) 1000 UNIT/ML DIALYSIS: 1000.0000 [IU] | INTRAMUSCULAR | Status: DC | PRN

## 2014-10-20 MED ORDER — DARBEPOETIN ALFA 200 MCG/0.4ML IJ SOSY
PREFILLED_SYRINGE | INTRAMUSCULAR | Status: AC
  Administered 2014-10-20: 200 ug via INTRAVENOUS
  Filled 2014-10-20: qty 0.4

## 2014-10-20 MED ORDER — LIDOCAINE HCL (PF) 1 % IJ SOLN: 5.0000 mL | INTRAMUSCULAR | Status: DC | PRN

## 2014-10-20 MED ORDER — NEPRO/CARBSTEADY PO LIQD
237.0000 mL | ORAL | Status: DC | PRN
  Filled 2014-10-20: qty 237

## 2014-10-20 MED ORDER — LIDOCAINE-PRILOCAINE 2.5-2.5 % EX CREA
1.0000 "application " | TOPICAL_CREAM | CUTANEOUS | Status: DC | PRN
  Filled 2014-10-20: qty 5

## 2014-10-20 MED ORDER — SODIUM CHLORIDE 0.9 % IV SOLN: 100.0000 mL | INTRAVENOUS | Status: DC | PRN

## 2014-10-20 NOTE — Progress Notes (Signed)
Pt discharged home after HD tx; pt denies SOB; Dizziness, pain or nausea and vomitting.Pt stopped tx with 5 mins left of tx; AMA form given to pt.  Pt walked out the unit by herself to go home.

## 2014-10-20 NOTE — ED Notes (Signed)
Pt comes into the hospital to receive dialysis. Pt has no c/o pain. Pt states she just needs dialysis.

## 2014-10-20 NOTE — ED Notes (Signed)
Breakfast ordered per patient request.

## 2014-10-20 NOTE — ED Provider Notes (Signed)
CSN: [id number]     Arrival date & time 10/20/14  8185 History   First MD Initiated Contact with Patient 10/20/14 [phone]     Chief Complaint  Patient presents with  . Vascular Access Problem     (Consider location/radiation/quality/duration/timing/severity/associated sxs/prior Treatment) HPI Please note patient has 2 charts  Mrs. Sara [name] is a 34 year old female with past medical history of lupus, hypertension, ESRD, on dialysis Monday Wednesday and Friday who presents the ER requesting routine dialysis. Patient is denying any current medical complaints, only states she is requiring dialysis today. Patient states she missed her last dialysis Wednesday, 2 days ago because it "took too long". Patient reports feeling well overall today.   Past Medical History  Diagnosis Date  . Lupus     "kidney lupus"  . Hypertension   . History of blood transfusion     "this is probably my 3rd" (10/09/2014)  . ESRD (end stage renal disease) on dialysis     "[name]; [facility]" (10/09/2014)  . Bipolar disorder     [name] 10/09/2014  . Schizophrenia     [name] 10/09/2014  . Chronic anemia     [name] 10/09/2014  . CHF (congestive heart failure)     systolic/notes 12/12/261  . Acute myopericarditis     hx/notes 10/09/2014   Past Surgical History  Procedure Laterality Date  . Av fistula placement Right 03/2013    upper  . Av fistula repair Right 2015   History reviewed. No pertinent family history. History  Substance Use Topics  . Smoking status: Current Every Day Smoker -- 1.00 packs/day for 15 years    Types: Cigarettes  . Smokeless tobacco: Never Used  . Alcohol Use: No   OB History    No data available     Review of Systems  Constitutional: Negative for fever.  HENT: Negative for trouble swallowing.   Eyes: Negative for visual disturbance.  Respiratory: Negative for shortness of breath.   Cardiovascular: Negative for chest pain.  Gastrointestinal: Negative for nausea, vomiting and abdominal pain.   Genitourinary: Negative for dysuria.  Musculoskeletal: Negative for neck pain.  Skin: Negative for rash.  Neurological: Negative for dizziness, weakness and numbness.  Psychiatric/Behavioral: Negative.       Allergies  Ativan; Geodon; Keflex; and Other  Home Medications   Prior to Admission medications   Medication Sig Start Date End Date Taking? Authorizing Provider  acetaminophen (TYLENOL) 500 MG tablet Take 500 mg by mouth every 6 (six) hours as needed for mild pain.    Historical Provider, MD  calcium acetate (PHOSLO) 667 MG capsule Take 667 mg by mouth 3 (three) times daily with meals.    Historical Provider, MD  colchicine 0.6 MG tablet Take 0.6 mg by mouth every other day.    Historical Provider, MD  haloperidol (HALDOL) 5 MG tablet Take 5 mg by mouth 2 (two) times daily.    Historical Provider, MD  hydrALAZINE (APRESOLINE) 25 MG tablet Take 25 mg by mouth 3 (three) times daily.    Historical Provider, MD  ibuprofen (ADVIL,MOTRIN) 200 MG tablet Take 400 mg by mouth every 8 (eight) hours as needed for mild pain.    Historical Provider, MD  isosorbide mononitrate (IMDUR) 30 MG 24 hr tablet Take 30 mg by mouth daily. 05/25/14   Historical Provider, MD  metoprolol tartrate (LOPRESSOR) 25 MG tablet Take 25 mg by mouth 2 (two) times daily.    Historical Provider, MD   BP 149/99 mmHg  Pulse 97  Temp(Src) 97.3 F (36.3 C) (Oral)  Resp 18  SpO2 98% Physical Exam  Constitutional: She is oriented to person, place, and time. She appears well-developed and well-nourished. No distress.  HENT:  Head: Normocephalic and atraumatic.  Mouth/Throat: Oropharynx is clear and moist. No oropharyngeal exudate.  Eyes: Right eye exhibits no discharge. Left eye exhibits no discharge. No scleral icterus.  Neck: Normal range of motion.  Cardiovascular: Normal rate, regular rhythm and normal heart sounds.   No murmur heard. Pulmonary/Chest: Effort normal and breath sounds normal. No respiratory  distress.  Abdominal: Soft. There is no tenderness.  Musculoskeletal: Normal range of motion. She exhibits no edema or tenderness.  Neurological: She is alert and oriented to person, place, and time. No cranial nerve deficit. Coordination normal.  Skin: Skin is warm and dry. No rash noted. She is not diaphoretic.  Psychiatric: She has a normal mood and affect.  Nursing note and vitals reviewed.   ED Course  Procedures (including critical care time) Labs Review Labs Reviewed - No data to display  Imaging Review No results found.   EKG Interpretation None      MDM   Final diagnoses:  Encounter for hemodialysis for ESRD    Patient here for routine dialysis. Of note patient has been dismissed from multiple hemodialysis centers in [place], now presents the ER on a regular basis for routine hemodialysis. Patient is well-known to the ED, does not have any medical complaints today, only here for hemodialysis. Spoke with Dr. Burman Foster in consultation patient's case, nephrology agrees to dialyze today. Patient transferred to dialysis. No further medical workup in ED is warranted today. Medical screening performed, patient's hematuria and stable and in no acute distress. Patient stable to proceed to dialysis.  BP 149/99 mmHg  Pulse 97  Temp(Src) 97.3 F (36.3 C) (Oral)  Resp 18  SpO2 98%  Signed,  Dahlia Bailiff, PA-C 11:51 AM     Dahlia Bailiff, PA-C 10/20/14 [place], PA-C 10/20/14 [place], MD 10/21/14 [phone]

## 2014-10-24 ENCOUNTER — Emergency Department (HOSPITAL_COMMUNITY)
Admission: EM | Admit: 2014-10-24 | Discharge: 2014-10-24 | Disposition: A | Discharge status: Home / Self Care | Payer: Medicare Other | Attending: Emergency Medicine | Admitting: Emergency Medicine

## 2014-10-24 ENCOUNTER — Encounter (HOSPITAL_COMMUNITY): Payer: Self-pay | Admitting: Emergency Medicine

## 2014-10-24 DIAGNOSIS — Z4902 Encounter for fitting and adjustment of peritoneal dialysis catheter: Secondary | ICD-10-CM | POA: Insufficient documentation

## 2014-10-24 DIAGNOSIS — Z8739 Personal history of other diseases of the musculoskeletal system and connective tissue: Secondary | ICD-10-CM | POA: Insufficient documentation

## 2014-10-24 DIAGNOSIS — Z8659 Personal history of other mental and behavioral disorders: Secondary | ICD-10-CM | POA: Insufficient documentation

## 2014-10-24 DIAGNOSIS — Z79899 Other long term (current) drug therapy: Secondary | ICD-10-CM | POA: Diagnosis not present

## 2014-10-24 DIAGNOSIS — Z720 Tobacco use: Secondary | ICD-10-CM | POA: Insufficient documentation

## 2014-10-24 DIAGNOSIS — Z862 Personal history of diseases of the blood and blood-forming organs and certain disorders involving the immune mechanism: Secondary | ICD-10-CM | POA: Insufficient documentation

## 2014-10-24 DIAGNOSIS — N186 End stage renal disease: Secondary | ICD-10-CM | POA: Diagnosis not present

## 2014-10-24 DIAGNOSIS — Z992 Dependence on renal dialysis: Secondary | ICD-10-CM

## 2014-10-24 DIAGNOSIS — I120 Hypertensive chronic kidney disease with stage 5 chronic kidney disease or end stage renal disease: Secondary | ICD-10-CM | POA: Diagnosis not present

## 2014-10-24 LAB — CBC WITH DIFFERENTIAL/PLATELET
BASOS ABS: 0.1 10*3/uL (ref 0.0–0.1)
BASOS PCT: 1 % (ref 0–1)
EOS PCT: 3 % (ref 0–5)
Eosinophils Absolute: 0.1 10*3/uL (ref 0.0–0.7)
HEMATOCRIT: 26 % — AB (ref 36.0–46.0)
Hemoglobin: 8.3 g/dL — ABNORMAL LOW (ref 12.0–15.0)
Lymphocytes Relative: 30 % (ref 12–46)
Lymphs Abs: 1.5 10*3/uL (ref 0.7–4.0)
MCH: 28.2 pg (ref 26.0–34.0)
MCHC: 31.9 g/dL (ref 30.0–36.0)
MCV: 88.4 fL (ref 78.0–100.0)
Monocytes Absolute: 0.6 10*3/uL (ref 0.1–1.0)
Monocytes Relative: 13 % — ABNORMAL HIGH (ref 3–12)
Neutro Abs: 2.6 10*3/uL (ref 1.7–7.7)
Neutrophils Relative %: 53 % (ref 43–77)
Platelets: 225 10*3/uL (ref 150–400)
RBC: 2.94 MIL/uL — ABNORMAL LOW (ref 3.87–5.11)
RDW: 22 % — AB (ref 11.5–15.5)
WBC: 4.9 10*3/uL (ref 4.0–10.5)

## 2014-10-24 LAB — RENAL FUNCTION PANEL
ALBUMIN: 2.4 g/dL — AB (ref 3.5–5.0)
ANION GAP: 14 (ref 5–15)
BUN: 85 mg/dL — AB (ref 6–20)
CHLORIDE: 101 mmol/L (ref 101–111)
CO2: 20 mmol/L — ABNORMAL LOW (ref 22–32)
Calcium: 8.7 mg/dL — ABNORMAL LOW (ref 8.9–10.3)
Creatinine, Ser: 13.46 mg/dL — ABNORMAL HIGH (ref 0.44–1.00)
GFR calc Af Amer: 4 mL/min — ABNORMAL LOW (ref >60)
GFR, EST NON AFRICAN AMERICAN: 3 mL/min — AB (ref >60)
GLUCOSE: 91 mg/dL (ref 65–99)
POTASSIUM: 5.1 mmol/L (ref 3.5–5.1)
Phosphorus: 11.5 mg/dL — ABNORMAL HIGH (ref 2.5–4.6)
Sodium: 135 mmol/L (ref 135–145)

## 2014-10-24 MED ORDER — PENTAFLUOROPROP-TETRAFLUOROETH EX AERO
1.0000 "application " | INHALATION_SPRAY | CUTANEOUS | Status: DC | PRN
  Filled 2014-10-24: qty 30

## 2014-10-24 MED ORDER — SODIUM CHLORIDE 0.9 % IV SOLN
250.0000 mg | Freq: Once | INTRAVENOUS | Status: AC
  Administered 2014-10-24: 250 mg via INTRAVENOUS
  Filled 2014-10-24: qty 20

## 2014-10-24 MED ORDER — HEPARIN SODIUM (PORCINE) 1000 UNIT/ML DIALYSIS
100.0000 [IU]/kg | INTRAMUSCULAR | Status: DC | PRN
  Filled 2014-10-24: qty 7

## 2014-10-24 MED ORDER — ALTEPLASE 2 MG IJ SOLR
2.0000 mg | Freq: Once | INTRAMUSCULAR | Status: DC | PRN
  Filled 2014-10-24: qty 2

## 2014-10-24 MED ORDER — SODIUM CHLORIDE 0.9 % IV SOLN: 100.0000 mL | INTRAVENOUS | Status: DC | PRN

## 2014-10-24 MED ORDER — DARBEPOETIN ALFA 200 MCG/0.4ML IJ SOSY
PREFILLED_SYRINGE | INTRAMUSCULAR | Status: AC
  Administered 2014-10-24: 200 ug via INTRAVENOUS
  Filled 2014-10-24: qty 0.4

## 2014-10-24 MED ORDER — LIDOCAINE HCL (PF) 1 % IJ SOLN: 5.0000 mL | INTRAMUSCULAR | Status: DC | PRN

## 2014-10-24 MED ORDER — DARBEPOETIN ALFA 200 MCG/0.4ML IJ SOSY
200.0000 ug | PREFILLED_SYRINGE | Freq: Once | INTRAMUSCULAR | Status: AC
  Administered 2014-10-24: 200 ug via INTRAVENOUS
  Filled 2014-10-24: qty 0.4

## 2014-10-24 MED ORDER — LIDOCAINE-PRILOCAINE 2.5-2.5 % EX CREA
1.0000 "application " | TOPICAL_CREAM | CUTANEOUS | Status: DC | PRN
  Filled 2014-10-24: qty 5

## 2014-10-24 MED ORDER — HEPARIN SODIUM (PORCINE) 1000 UNIT/ML DIALYSIS
1000.0000 [IU] | INTRAMUSCULAR | Status: DC | PRN
  Filled 2014-10-24: qty 1

## 2014-10-24 MED ORDER — NEPRO/CARBSTEADY PO LIQD
237.0000 mL | ORAL | Status: DC | PRN
  Filled 2014-10-24: qty 237

## 2014-10-24 NOTE — Progress Notes (Signed)
Pt signed off ama 8 mins early of 4 hour treatment ordered. Goal unmet removed 4641 of 5 liter goal. Discharged home with belongings.

## 2014-10-24 NOTE — ED Notes (Signed)
Report given to dialysis, transported to dialysis.

## 2014-10-24 NOTE — Procedures (Addendum)
I was present at this dialysis session. I have reviewed the session itself and made appropriate changes.   Labs ordered for HD.  Plan for IV Fe and ESA with Tx.   Pearson Grippe  MD 10/24/2014, 1:27 PM

## 2014-10-24 NOTE — ED Notes (Signed)
Pt arrived to ED to be sent to dialysis. Pt has no complaints. Last dialysis was Friday.
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
## 2014-10-25 ENCOUNTER — Encounter (HOSPITAL_COMMUNITY): Payer: Self-pay | Admitting: Emergency Medicine

## 2014-10-25 LAB — HEPATITIS B SURFACE ANTIGEN: Hepatitis B Surface Ag: NEGATIVE

## 2014-10-27 ENCOUNTER — Encounter (HOSPITAL_COMMUNITY): Payer: Self-pay | Admitting: Nurse Practitioner

## 2014-10-27 ENCOUNTER — Emergency Department (HOSPITAL_COMMUNITY)
Admission: EM | Admit: 2014-10-27 | Discharge: 2014-10-27 | Discharge status: Against Medical Advice | Payer: Medicare Other | Attending: Emergency Medicine | Admitting: Emergency Medicine

## 2014-10-27 DIAGNOSIS — Z8739 Personal history of other diseases of the musculoskeletal system and connective tissue: Secondary | ICD-10-CM | POA: Diagnosis not present

## 2014-10-27 DIAGNOSIS — Z79899 Other long term (current) drug therapy: Secondary | ICD-10-CM | POA: Insufficient documentation

## 2014-10-27 DIAGNOSIS — I120 Hypertensive chronic kidney disease with stage 5 chronic kidney disease or end stage renal disease: Secondary | ICD-10-CM | POA: Diagnosis not present

## 2014-10-27 DIAGNOSIS — N186 End stage renal disease: Secondary | ICD-10-CM | POA: Diagnosis not present

## 2014-10-27 DIAGNOSIS — F29 Unspecified psychosis not due to a substance or known physiological condition: Secondary | ICD-10-CM | POA: Insufficient documentation

## 2014-10-27 DIAGNOSIS — I509 Heart failure, unspecified: Secondary | ICD-10-CM | POA: Diagnosis not present

## 2014-10-27 DIAGNOSIS — Z862 Personal history of diseases of the blood and blood-forming organs and certain disorders involving the immune mechanism: Secondary | ICD-10-CM | POA: Diagnosis not present

## 2014-10-27 DIAGNOSIS — Z720 Tobacco use: Secondary | ICD-10-CM | POA: Diagnosis not present

## 2014-10-27 DIAGNOSIS — Z4902 Encounter for fitting and adjustment of peritoneal dialysis catheter: Secondary | ICD-10-CM | POA: Diagnosis present

## 2014-10-27 MED ORDER — SODIUM CHLORIDE 0.9 % IV SOLN: 250.0000 mg | Freq: Once | INTRAVENOUS | Status: DC

## 2014-10-27 NOTE — ED Notes (Signed)
I gave the patient a cup of ice water and a cup of ice.

## 2014-10-27 NOTE — ED Notes (Signed)
MD at bedside. 

## 2014-10-27 NOTE — ED Notes (Signed)
She states she is here for dialysis. She states she last had a full dialysis treatment here on Tuesday. She denies any complaints
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
## 2014-10-27 NOTE — ED Notes (Signed)
Pt came out of room, stating she was leaving and would come back in the morning early.  Pt refused d/c vitals or to sign.  Pt NAD, ambulatory to discharge.

## 2014-10-28 ENCOUNTER — Encounter (HOSPITAL_COMMUNITY): Payer: Self-pay | Admitting: Emergency Medicine

## 2014-10-28 ENCOUNTER — Non-Acute Institutional Stay (HOSPITAL_COMMUNITY)
Admission: EM | Admit: 2014-10-28 | Discharge: 2014-10-28 | Disposition: A | Discharge status: Home / Self Care | Payer: Medicare Other | Attending: Emergency Medicine | Admitting: Emergency Medicine

## 2014-10-28 DIAGNOSIS — F17210 Nicotine dependence, cigarettes, uncomplicated: Secondary | ICD-10-CM | POA: Diagnosis not present

## 2014-10-28 DIAGNOSIS — Z79899 Other long term (current) drug therapy: Secondary | ICD-10-CM | POA: Insufficient documentation

## 2014-10-28 DIAGNOSIS — I509 Heart failure, unspecified: Secondary | ICD-10-CM | POA: Insufficient documentation

## 2014-10-28 DIAGNOSIS — N186 End stage renal disease: Secondary | ICD-10-CM | POA: Diagnosis present

## 2014-10-28 DIAGNOSIS — I120 Hypertensive chronic kidney disease with stage 5 chronic kidney disease or end stage renal disease: Secondary | ICD-10-CM | POA: Diagnosis not present

## 2014-10-28 DIAGNOSIS — M3214 Glomerular disease in systemic lupus erythematosus: Secondary | ICD-10-CM | POA: Insufficient documentation

## 2014-10-28 DIAGNOSIS — Z992 Dependence on renal dialysis: Secondary | ICD-10-CM | POA: Insufficient documentation

## 2014-10-28 LAB — RENAL FUNCTION PANEL
ALBUMIN: 2.6 g/dL — AB (ref 3.5–5.0)
Anion gap: 15 (ref 5–15)
BUN: 73 mg/dL — AB (ref 6–20)
CHLORIDE: 98 mmol/L — AB (ref 101–111)
CO2: 21 mmol/L — AB (ref 22–32)
CREATININE: 13.41 mg/dL — AB (ref 0.44–1.00)
Calcium: 9.2 mg/dL (ref 8.9–10.3)
GFR calc Af Amer: 4 mL/min — ABNORMAL LOW (ref >60)
GFR calc non Af Amer: 3 mL/min — ABNORMAL LOW (ref >60)
Glucose, Bld: 116 mg/dL — ABNORMAL HIGH (ref 65–99)
Phosphorus: 10.8 mg/dL — ABNORMAL HIGH (ref 2.5–4.6)
Potassium: 4.9 mmol/L (ref 3.5–5.1)
Sodium: 134 mmol/L — ABNORMAL LOW (ref 135–145)

## 2014-10-28 LAB — CBC
HEMATOCRIT: 27.1 % — AB (ref 36.0–46.0)
HEMOGLOBIN: 8.4 g/dL — AB (ref 12.0–15.0)
MCH: 27.4 pg (ref 26.0–34.0)
MCHC: 31 g/dL (ref 30.0–36.0)
MCV: 88.3 fL (ref 78.0–100.0)
PLATELETS: 216 10*3/uL (ref 150–400)
RBC: 3.07 MIL/uL — ABNORMAL LOW (ref 3.87–5.11)
RDW: 22.5 % — ABNORMAL HIGH (ref 11.5–15.5)
WBC: 4.2 10*3/uL (ref 4.0–10.5)

## 2014-10-28 LAB — I-STAT CHEM 8, ED
BUN: 69 mg/dL — AB (ref 6–20)
CALCIUM ION: 1.14 mmol/L (ref 1.12–1.23)
Chloride: 103 mmol/L (ref 101–111)
Creatinine, Ser: 13.5 mg/dL — ABNORMAL HIGH (ref 0.44–1.00)
Glucose, Bld: 77 mg/dL (ref 65–99)
HCT: 34 % — ABNORMAL LOW (ref 36.0–46.0)
HEMOGLOBIN: 11.6 g/dL — AB (ref 12.0–15.0)
Potassium: 5.1 mmol/L (ref 3.5–5.1)
Sodium: 136 mmol/L (ref 135–145)
TCO2: 21 mmol/L (ref 0–100)

## 2014-10-28 MED ORDER — ALTEPLASE 2 MG IJ SOLR: 2.0000 mg | Freq: Once | INTRAMUSCULAR | Status: DC | PRN

## 2014-10-28 MED ORDER — LIDOCAINE HCL (PF) 1 % IJ SOLN: 5.0000 mL | INTRAMUSCULAR | Status: DC | PRN

## 2014-10-28 MED ORDER — SODIUM CHLORIDE 0.9 % IV SOLN: 100.0000 mL | INTRAVENOUS | Status: DC | PRN

## 2014-10-28 MED ORDER — PENTAFLUOROPROP-TETRAFLUOROETH EX AERO: 1.0000 "application " | INHALATION_SPRAY | CUTANEOUS | Status: DC | PRN

## 2014-10-28 MED ORDER — NEPRO/CARBSTEADY PO LIQD: 237.0000 mL | ORAL | Status: DC | PRN

## 2014-10-28 MED ORDER — LIDOCAINE-PRILOCAINE 2.5-2.5 % EX CREA: 1.0000 "application " | TOPICAL_CREAM | CUTANEOUS | Status: DC | PRN

## 2014-10-28 MED ORDER — HEPARIN SODIUM (PORCINE) 1000 UNIT/ML DIALYSIS: 1000.0000 [IU] | INTRAMUSCULAR | Status: DC | PRN
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
## 2014-10-28 NOTE — Procedures (Signed)
I was present at this session.  I have reviewed the session itself and made appropriate changes.  Bp ^, to get off 5 liters  Markia Kyer L 7/23/201611:59 AM

## 2014-10-28 NOTE — Progress Notes (Signed)
Hemodialysis- Complete. Pt signed off early AMA with 40 minutes remaining. Total uf 3.3L. Pt had multiple episodes of cramping during treatment today. BP remains elevated. Pt discharged to home on foot from HD unit.

## 2014-10-28 NOTE — ED Notes (Signed)
Pt. Is here for dialysis

## 2014-10-30 ENCOUNTER — Encounter (HOSPITAL_COMMUNITY): Payer: Self-pay | Admitting: Family Medicine

## 2014-10-30 ENCOUNTER — Emergency Department (HOSPITAL_COMMUNITY)
Admission: EM | Admit: 2014-10-30 | Discharge: 2014-10-30 | Disposition: A | Discharge status: Home / Self Care | Payer: Medicare Other | Attending: Emergency Medicine | Admitting: Emergency Medicine

## 2014-10-30 DIAGNOSIS — Z862 Personal history of diseases of the blood and blood-forming organs and certain disorders involving the immune mechanism: Secondary | ICD-10-CM | POA: Diagnosis not present

## 2014-10-30 DIAGNOSIS — I509 Heart failure, unspecified: Secondary | ICD-10-CM | POA: Insufficient documentation

## 2014-10-30 DIAGNOSIS — Z79899 Other long term (current) drug therapy: Secondary | ICD-10-CM | POA: Insufficient documentation

## 2014-10-30 DIAGNOSIS — F209 Schizophrenia, unspecified: Secondary | ICD-10-CM | POA: Insufficient documentation

## 2014-10-30 DIAGNOSIS — N186 End stage renal disease: Secondary | ICD-10-CM | POA: Diagnosis not present

## 2014-10-30 DIAGNOSIS — I120 Hypertensive chronic kidney disease with stage 5 chronic kidney disease or end stage renal disease: Secondary | ICD-10-CM | POA: Insufficient documentation

## 2014-10-30 DIAGNOSIS — Z8739 Personal history of other diseases of the musculoskeletal system and connective tissue: Secondary | ICD-10-CM | POA: Insufficient documentation

## 2014-10-30 DIAGNOSIS — Z720 Tobacco use: Secondary | ICD-10-CM | POA: Diagnosis not present

## 2014-10-30 DIAGNOSIS — F29 Unspecified psychosis not due to a substance or known physiological condition: Secondary | ICD-10-CM | POA: Diagnosis not present

## 2014-10-30 DIAGNOSIS — Z992 Dependence on renal dialysis: Secondary | ICD-10-CM | POA: Insufficient documentation

## 2014-10-30 DIAGNOSIS — Z4931 Encounter for adequacy testing for hemodialysis: Secondary | ICD-10-CM | POA: Diagnosis present

## 2014-10-30 LAB — BASIC METABOLIC PANEL
ANION GAP: 15 (ref 5–15)
BUN: 57 mg/dL — AB (ref 6–20)
CO2: 23 mmol/L (ref 22–32)
Calcium: 8.9 mg/dL (ref 8.9–10.3)
Chloride: 99 mmol/L — ABNORMAL LOW (ref 101–111)
Creatinine, Ser: 11.55 mg/dL — ABNORMAL HIGH (ref 0.44–1.00)
GFR calc Af Amer: 4 mL/min — ABNORMAL LOW (ref >60)
GFR calc non Af Amer: 4 mL/min — ABNORMAL LOW (ref >60)
Glucose, Bld: 79 mg/dL (ref 65–99)
Potassium: 4.3 mmol/L (ref 3.5–5.1)
Sodium: 137 mmol/L (ref 135–145)

## 2014-10-30 LAB — CBC
HEMATOCRIT: 28 % — AB (ref 36.0–46.0)
Hemoglobin: 8.8 g/dL — ABNORMAL LOW (ref 12.0–15.0)
MCH: 28 pg (ref 26.0–34.0)
MCHC: 31.4 g/dL (ref 30.0–36.0)
MCV: 89.2 fL (ref 78.0–100.0)
Platelets: 196 10*3/uL (ref 150–400)
RBC: 3.14 MIL/uL — ABNORMAL LOW (ref 3.87–5.11)
RDW: 22.1 % — ABNORMAL HIGH (ref 11.5–15.5)
WBC: 4.1 10*3/uL (ref 4.0–10.5)

## 2014-10-30 MED ORDER — LIDOCAINE-PRILOCAINE 2.5-2.5 % EX CREA
1.0000 "application " | TOPICAL_CREAM | CUTANEOUS | Status: DC | PRN
  Filled 2014-10-30: qty 5

## 2014-10-30 MED ORDER — LIDOCAINE HCL (PF) 1 % IJ SOLN: 5.0000 mL | INTRAMUSCULAR | Status: DC | PRN

## 2014-10-30 MED ORDER — ALTEPLASE 2 MG IJ SOLR: 2.0000 mg | Freq: Once | INTRAMUSCULAR | Status: DC | PRN

## 2014-10-30 MED ORDER — ACETAMINOPHEN 325 MG PO TABS
ORAL_TABLET | ORAL | Status: AC
  Filled 2014-10-30: qty 2

## 2014-10-30 MED ORDER — SODIUM CHLORIDE 0.9 % IV SOLN: 100.0000 mL | INTRAVENOUS | Status: DC | PRN

## 2014-10-30 MED ORDER — PENTAFLUOROPROP-TETRAFLUOROETH EX AERO: 1.0000 "application " | INHALATION_SPRAY | CUTANEOUS | Status: DC | PRN

## 2014-10-30 MED ORDER — NEPRO/CARBSTEADY PO LIQD: 237.0000 mL | ORAL | Status: DC | PRN

## 2014-10-30 MED ORDER — HEPARIN SODIUM (PORCINE) 1000 UNIT/ML DIALYSIS: 6000.0000 [IU] | Freq: Once | INTRAMUSCULAR | Status: DC

## 2014-10-30 MED ORDER — HEPARIN SODIUM (PORCINE) 1000 UNIT/ML DIALYSIS: 1000.0000 [IU] | INTRAMUSCULAR | Status: DC | PRN

## 2014-10-30 MED ORDER — DARBEPOETIN ALFA 200 MCG/0.4ML IJ SOSY
PREFILLED_SYRINGE | INTRAMUSCULAR | Status: AC
  Administered 2014-10-30: 200 ug
  Filled 2014-10-30: qty 0.4

## 2014-10-30 MED ORDER — ACETAMINOPHEN 325 MG PO TABS
650.0000 mg | ORAL_TABLET | Freq: Once | ORAL | Status: AC
  Administered 2014-10-30: 650 mg via ORAL

## 2014-10-30 MED ORDER — DARBEPOETIN ALFA 200 MCG/0.4ML IJ SOSY
200.0000 ug | PREFILLED_SYRINGE | Freq: Once | INTRAMUSCULAR | Status: AC
  Administered 2014-10-30: 200 ug via SUBCUTANEOUS

## 2014-10-30 NOTE — Procedures (Signed)
I was present at this dialysis session, have reviewed the session itself and made  appropriate changes. Stable on HD. K 4.7, Hb 8.8. Hb improving after IV iron given in Jun/ July and with ESA. Will give darbe 200 ug SQ today.   Kelly Splinter MD (pgr) [phone]    ([phone] 09/23/2014, 10:31 AM   Old orders from [facility] Feb 2016 > 4 hrs59kg 2K/2CaHeparin 6000AVF R   DateAranespIV ironTsat  7/9/16500 mg feraheme 7/4/16200 ug250 mg Nulecit 17% 10/04/14 100 ug 6/25/167 % 6/21/16200 ug 6/14/16200 ug 6/11/16500 mg feraheme 6/9/16200 ug5% 5/25/16100 ug

## 2014-10-30 NOTE — ED Notes (Signed)
Pt states she is here for hemodialysis. No new symptoms reported. Pt resting quietly at the time. No signs of distress noted.
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
## 2014-10-30 NOTE — ED Notes (Signed)
Pt transported to hemodialysis  

## 2014-10-30 NOTE — ED Notes (Signed)
Pt here for dialysis. sts here Saturday for dialysis

## 2014-11-01 ENCOUNTER — Non-Acute Institutional Stay (HOSPITAL_COMMUNITY)
Admission: EM | Admit: 2014-11-01 | Discharge: 2014-11-01 | Disposition: A | Discharge status: Home / Self Care | Payer: Medicare Other | Attending: Emergency Medicine | Admitting: Emergency Medicine

## 2014-11-01 ENCOUNTER — Encounter (HOSPITAL_COMMUNITY): Payer: Self-pay | Admitting: Family Medicine

## 2014-11-01 DIAGNOSIS — M3214 Glomerular disease in systemic lupus erythematosus: Secondary | ICD-10-CM | POA: Diagnosis not present

## 2014-11-01 DIAGNOSIS — I120 Hypertensive chronic kidney disease with stage 5 chronic kidney disease or end stage renal disease: Secondary | ICD-10-CM | POA: Diagnosis not present

## 2014-11-01 DIAGNOSIS — F209 Schizophrenia, unspecified: Secondary | ICD-10-CM | POA: Diagnosis not present

## 2014-11-01 DIAGNOSIS — F319 Bipolar disorder, unspecified: Secondary | ICD-10-CM | POA: Insufficient documentation

## 2014-11-01 DIAGNOSIS — I5022 Chronic systolic (congestive) heart failure: Secondary | ICD-10-CM | POA: Diagnosis not present

## 2014-11-01 DIAGNOSIS — Z79899 Other long term (current) drug therapy: Secondary | ICD-10-CM | POA: Insufficient documentation

## 2014-11-01 DIAGNOSIS — Z992 Dependence on renal dialysis: Secondary | ICD-10-CM | POA: Diagnosis not present

## 2014-11-01 DIAGNOSIS — F17210 Nicotine dependence, cigarettes, uncomplicated: Secondary | ICD-10-CM | POA: Insufficient documentation

## 2014-11-01 DIAGNOSIS — D649 Anemia, unspecified: Secondary | ICD-10-CM | POA: Insufficient documentation

## 2014-11-01 DIAGNOSIS — N186 End stage renal disease: Secondary | ICD-10-CM | POA: Diagnosis present

## 2014-11-01 LAB — RENAL FUNCTION PANEL
Albumin: 2.6 g/dL — ABNORMAL LOW (ref 3.5–5.0)
Anion gap: 12 (ref 5–15)
BUN: 42 mg/dL — ABNORMAL HIGH (ref 6–20)
CO2: 24 mmol/L (ref 22–32)
Calcium: 8.8 mg/dL — ABNORMAL LOW (ref 8.9–10.3)
Chloride: 99 mmol/L — ABNORMAL LOW (ref 101–111)
Creatinine, Ser: 9.56 mg/dL — ABNORMAL HIGH (ref 0.44–1.00)
GFR calc Af Amer: 6 mL/min — ABNORMAL LOW
GFR calc non Af Amer: 5 mL/min — ABNORMAL LOW
Glucose, Bld: 101 mg/dL — ABNORMAL HIGH (ref 65–99)
Phosphorus: 10.6 mg/dL — ABNORMAL HIGH (ref 2.5–4.6)
Potassium: 4.4 mmol/L (ref 3.5–5.1)
Sodium: 135 mmol/L (ref 135–145)

## 2014-11-01 LAB — I-STAT CHEM 8, ED
BUN: 48 mg/dL — ABNORMAL HIGH (ref 6–20)
CALCIUM ION: 1.11 mmol/L — AB (ref 1.12–1.23)
CHLORIDE: 103 mmol/L (ref 101–111)
Creatinine, Ser: 9.8 mg/dL — ABNORMAL HIGH (ref 0.44–1.00)
Glucose, Bld: 70 mg/dL (ref 65–99)
HEMATOCRIT: 34 % — AB (ref 36.0–46.0)
Hemoglobin: 11.6 g/dL — ABNORMAL LOW (ref 12.0–15.0)
Potassium: 4.8 mmol/L (ref 3.5–5.1)
Sodium: 137 mmol/L (ref 135–145)
TCO2: 20 mmol/L (ref 0–100)

## 2014-11-01 LAB — IRON AND TIBC
Iron: 46 ug/dL (ref 28–170)
Saturation Ratios: 23 % (ref 10.4–31.8)
TIBC: 204 ug/dL — ABNORMAL LOW (ref 250–450)
UIBC: 158 ug/dL

## 2014-11-01 LAB — CBC
HCT: 27.9 % — ABNORMAL LOW (ref 36.0–46.0)
Hemoglobin: 8.8 g/dL — ABNORMAL LOW (ref 12.0–15.0)
MCH: 28.5 pg (ref 26.0–34.0)
MCHC: 31.5 g/dL (ref 30.0–36.0)
MCV: 90.3 fL (ref 78.0–100.0)
Platelets: 130 K/uL — ABNORMAL LOW (ref 150–400)
RBC: 3.09 MIL/uL — ABNORMAL LOW (ref 3.87–5.11)
RDW: 21.7 % — ABNORMAL HIGH (ref 11.5–15.5)
WBC: 4.3 K/uL (ref 4.0–10.5)

## 2014-11-01 LAB — FERRITIN: FERRITIN: 1138 ng/mL — AB (ref 11–307)

## 2014-11-01 MED ORDER — SODIUM CHLORIDE 0.9 % IV SOLN: 100.0000 mL | INTRAVENOUS | Status: DC | PRN

## 2014-11-01 MED ORDER — ALTEPLASE 2 MG IJ SOLR: 2.0000 mg | Freq: Once | INTRAMUSCULAR | Status: DC | PRN

## 2014-11-01 MED ORDER — HEPARIN SODIUM (PORCINE) 1000 UNIT/ML DIALYSIS
6000.0000 [IU] | Freq: Once | INTRAMUSCULAR | Status: DC
  Filled 2014-11-01: qty 6

## 2014-11-01 MED ORDER — NEPRO/CARBSTEADY PO LIQD: 237.0000 mL | ORAL | Status: DC | PRN

## 2014-11-01 MED ORDER — LIDOCAINE HCL (PF) 1 % IJ SOLN: 5.0000 mL | INTRAMUSCULAR | Status: DC | PRN

## 2014-11-01 MED ORDER — HEPARIN SODIUM (PORCINE) 1000 UNIT/ML DIALYSIS: 1000.0000 [IU] | INTRAMUSCULAR | Status: DC | PRN

## 2014-11-01 MED ORDER — PENTAFLUOROPROP-TETRAFLUOROETH EX AERO: 1.0000 "application " | INHALATION_SPRAY | CUTANEOUS | Status: DC | PRN

## 2014-11-01 MED ORDER — SODIUM CHLORIDE 0.9 % IV SOLN
100.0000 mL | INTRAVENOUS | Status: DC | PRN
Start: 2014-11-01 — End: 2014-11-02

## 2014-11-01 MED ORDER — LIDOCAINE-PRILOCAINE 2.5-2.5 % EX CREA: 1.0000 "application " | TOPICAL_CREAM | CUTANEOUS | Status: DC | PRN

## 2014-11-01 MED ORDER — HEPARIN SODIUM (PORCINE) 1000 UNIT/ML DIALYSIS
100.0000 [IU]/kg | INTRAMUSCULAR | Status: DC | PRN
  Filled 2014-11-01: qty 7

## 2014-11-01 MED ORDER — ALTEPLASE 2 MG IJ SOLR
2.0000 mg | Freq: Once | INTRAMUSCULAR | Status: AC | PRN
  Filled 2014-11-01: qty 2

## 2014-11-01 MED ORDER — LIDOCAINE-PRILOCAINE 2.5-2.5 % EX CREA
1.0000 "application " | TOPICAL_CREAM | CUTANEOUS | Status: DC | PRN
  Filled 2014-11-01: qty 5

## 2014-11-01 MED ORDER — NEPRO/CARBSTEADY PO LIQD
237.0000 mL | ORAL | Status: DC | PRN
  Filled 2014-11-01: qty 237

## 2014-11-01 NOTE — ED Notes (Signed)
Per Cassie, NS pt was called twice with no answer in main Ed waiting area

## 2014-11-01 NOTE — ED Notes (Signed)
Pt called in main ED waiting area with no response 

## 2014-11-01 NOTE — ED Notes (Signed)
Pt here for dialysis

## 2014-11-01 NOTE — Procedures (Signed)
I was present at this dialysis session, have reviewed the session itself and made  appropriate changes  Sara [name] presents for hemodialysis. No complaints.  She has rec'd 1000 ug total of darbepoetin this month and 1000 mg total IV iron this month already.  Does not need anything for anemia today.  Repeat iron studies.  Resume aranesp the first week of August at ~ 200 ug / week.    Today K is 4.8, BUN 48, creat 9.80.  Hb 11 by istat, doubt accuracy, will check CBC w/ HD.   Old orders from [facility] Feb 2016 > 4 hrs59kg 2K/2CaHeparin 6000AVF R   DateAranespIV ironTsat  10/30/14 200 ug 10/24/14 200 ug   250 mg Nulecit 10/20/14 200 ug 10/16/14 200 ug 7/9/16500 mg feraheme 7/4/16200 ug250 mg Nulecit 17% 10/04/14 100 ug 6/25/167 % 6/21/16200 ug 6/14/16200 ug 6/11/16500 mg feraheme 6/9/16200 ug5% 5/25/16100 ug   Kelly Splinter MD (pgr) [phone]    (c) [phone] 09/23/2014, 10:31 AM
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
## 2014-11-03 ENCOUNTER — Emergency Department (HOSPITAL_COMMUNITY)
Admission: EM | Admit: 2014-11-03 | Discharge: 2014-11-03 | Disposition: A | Discharge status: Home / Self Care | Payer: Medicare Other | Attending: Emergency Medicine | Admitting: Emergency Medicine

## 2014-11-03 ENCOUNTER — Encounter (HOSPITAL_COMMUNITY): Payer: Self-pay | Admitting: Emergency Medicine

## 2014-11-03 DIAGNOSIS — I5020 Unspecified systolic (congestive) heart failure: Secondary | ICD-10-CM | POA: Diagnosis not present

## 2014-11-03 DIAGNOSIS — Z992 Dependence on renal dialysis: Secondary | ICD-10-CM | POA: Insufficient documentation

## 2014-11-03 DIAGNOSIS — Z862 Personal history of diseases of the blood and blood-forming organs and certain disorders involving the immune mechanism: Secondary | ICD-10-CM | POA: Diagnosis not present

## 2014-11-03 DIAGNOSIS — F209 Schizophrenia, unspecified: Secondary | ICD-10-CM | POA: Diagnosis not present

## 2014-11-03 DIAGNOSIS — Z720 Tobacco use: Secondary | ICD-10-CM | POA: Diagnosis not present

## 2014-11-03 DIAGNOSIS — Z79899 Other long term (current) drug therapy: Secondary | ICD-10-CM | POA: Diagnosis not present

## 2014-11-03 DIAGNOSIS — I120 Hypertensive chronic kidney disease with stage 5 chronic kidney disease or end stage renal disease: Secondary | ICD-10-CM | POA: Insufficient documentation

## 2014-11-03 DIAGNOSIS — Z872 Personal history of diseases of the skin and subcutaneous tissue: Secondary | ICD-10-CM | POA: Diagnosis not present

## 2014-11-03 DIAGNOSIS — N186 End stage renal disease: Secondary | ICD-10-CM | POA: Diagnosis not present

## 2014-11-03 LAB — I-STAT CHEM 8, ED
BUN: 43 mg/dL — ABNORMAL HIGH (ref 6–20)
CALCIUM ION: 1.14 mmol/L (ref 1.12–1.23)
CREATININE: 10.1 mg/dL — AB (ref 0.44–1.00)
Chloride: 103 mmol/L (ref 101–111)
GLUCOSE: 76 mg/dL (ref 65–99)
HEMATOCRIT: 36 % (ref 36.0–46.0)
HEMOGLOBIN: 12.2 g/dL (ref 12.0–15.0)
POTASSIUM: 4.7 mmol/L (ref 3.5–5.1)
SODIUM: 135 mmol/L (ref 135–145)
TCO2: 24 mmol/L (ref 0–100)

## 2014-11-03 NOTE — ED Notes (Signed)
Pt refusing IV at current time. States "I dont need it, I just need dialysis."

## 2014-11-03 NOTE — ED Notes (Signed)
Introduced myself to the patient is calm and resting.

## 2014-11-03 NOTE — ED Notes (Signed)
The patient knows to come back at 0630hrs tomorrow morning.  She understood the instructions with no questions.

## 2014-11-03 NOTE — ED Notes (Signed)
Regular dialysis days are Mon/Weds/Fri at [facility]. Here for dialysis. Has no complaints.
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
## 2014-11-04 ENCOUNTER — Encounter (HOSPITAL_COMMUNITY): Payer: Self-pay | Admitting: Emergency Medicine

## 2014-11-04 ENCOUNTER — Non-Acute Institutional Stay (HOSPITAL_COMMUNITY)
Admission: EM | Admit: 2014-11-04 | Discharge: 2014-11-04 | Disposition: A | Discharge status: Home / Self Care | Payer: Medicare Other | Attending: Emergency Medicine | Admitting: Emergency Medicine

## 2014-11-04 DIAGNOSIS — F209 Schizophrenia, unspecified: Secondary | ICD-10-CM | POA: Insufficient documentation

## 2014-11-04 DIAGNOSIS — Z992 Dependence on renal dialysis: Secondary | ICD-10-CM | POA: Insufficient documentation

## 2014-11-04 DIAGNOSIS — I120 Hypertensive chronic kidney disease with stage 5 chronic kidney disease or end stage renal disease: Secondary | ICD-10-CM | POA: Insufficient documentation

## 2014-11-04 DIAGNOSIS — I5022 Chronic systolic (congestive) heart failure: Secondary | ICD-10-CM | POA: Insufficient documentation

## 2014-11-04 DIAGNOSIS — N186 End stage renal disease: Secondary | ICD-10-CM | POA: Diagnosis present

## 2014-11-04 DIAGNOSIS — F319 Bipolar disorder, unspecified: Secondary | ICD-10-CM | POA: Insufficient documentation

## 2014-11-04 DIAGNOSIS — L299 Pruritus, unspecified: Secondary | ICD-10-CM | POA: Insufficient documentation

## 2014-11-04 DIAGNOSIS — M3214 Glomerular disease in systemic lupus erythematosus: Secondary | ICD-10-CM | POA: Diagnosis not present

## 2014-11-04 DIAGNOSIS — D649 Anemia, unspecified: Secondary | ICD-10-CM | POA: Diagnosis not present

## 2014-11-04 DIAGNOSIS — Z79899 Other long term (current) drug therapy: Secondary | ICD-10-CM | POA: Diagnosis not present

## 2014-11-04 DIAGNOSIS — F17210 Nicotine dependence, cigarettes, uncomplicated: Secondary | ICD-10-CM | POA: Diagnosis not present

## 2014-11-04 LAB — RENAL FUNCTION PANEL
ALBUMIN: 2.8 g/dL — AB (ref 3.5–5.0)
Anion gap: 15 (ref 5–15)
BUN: 53 mg/dL — ABNORMAL HIGH (ref 6–20)
CALCIUM: 9 mg/dL (ref 8.9–10.3)
CHLORIDE: 97 mmol/L — AB (ref 101–111)
CO2: 21 mmol/L — AB (ref 22–32)
Creatinine, Ser: 11.12 mg/dL — ABNORMAL HIGH (ref 0.44–1.00)
GFR calc Af Amer: 5 mL/min — ABNORMAL LOW (ref >60)
GFR calc non Af Amer: 4 mL/min — ABNORMAL LOW (ref >60)
Glucose, Bld: 67 mg/dL (ref 65–99)
Phosphorus: 10.2 mg/dL — ABNORMAL HIGH (ref 2.5–4.6)
Potassium: 4.6 mmol/L (ref 3.5–5.1)
Sodium: 133 mmol/L — ABNORMAL LOW (ref 135–145)

## 2014-11-04 LAB — CBC
HCT: 29.2 % — ABNORMAL LOW (ref 36.0–46.0)
Hemoglobin: 9 g/dL — ABNORMAL LOW (ref 12.0–15.0)
MCH: 27.4 pg (ref 26.0–34.0)
MCHC: 30.8 g/dL (ref 30.0–36.0)
MCV: 88.8 fL (ref 78.0–100.0)
Platelets: 142 10*3/uL — ABNORMAL LOW (ref 150–400)
RBC: 3.29 MIL/uL — AB (ref 3.87–5.11)
RDW: 20.9 % — ABNORMAL HIGH (ref 11.5–15.5)
WBC: 4.5 10*3/uL (ref 4.0–10.5)

## 2014-11-04 MED ORDER — SODIUM CHLORIDE 0.9 % IV SOLN: 100.0000 mL | INTRAVENOUS | Status: DC | PRN

## 2014-11-04 MED ORDER — HEPARIN SODIUM (PORCINE) 1000 UNIT/ML DIALYSIS: 1000.0000 [IU] | INTRAMUSCULAR | Status: DC | PRN

## 2014-11-04 MED ORDER — ALTEPLASE 2 MG IJ SOLR
2.0000 mg | Freq: Once | INTRAMUSCULAR | Status: DC | PRN
  Filled 2014-11-04: qty 2

## 2014-11-04 MED ORDER — SODIUM CHLORIDE 0.9 % IV SOLN
62.5000 mg | Freq: Once | INTRAVENOUS | Status: AC
  Administered 2014-11-04: 62.5 mg via INTRAVENOUS
  Filled 2014-11-04 (×2): qty 5

## 2014-11-04 MED ORDER — HEPARIN SODIUM (PORCINE) 1000 UNIT/ML DIALYSIS: 20.0000 [IU]/kg | INTRAMUSCULAR | Status: DC | PRN

## 2014-11-04 MED ORDER — DIPHENHYDRAMINE HCL 50 MG/ML IJ SOLN: 25.0000 mg | Freq: Once | INTRAMUSCULAR | Status: DC

## 2014-11-04 MED ORDER — LIDOCAINE-PRILOCAINE 2.5-2.5 % EX CREA
1.0000 "application " | TOPICAL_CREAM | CUTANEOUS | Status: DC | PRN
  Filled 2014-11-04: qty 5

## 2014-11-04 MED ORDER — LIDOCAINE HCL (PF) 1 % IJ SOLN: 5.0000 mL | INTRAMUSCULAR | Status: DC | PRN

## 2014-11-04 MED ORDER — DIPHENHYDRAMINE HCL 25 MG PO CAPS
25.0000 mg | ORAL_CAPSULE | Freq: Once | ORAL | Status: AC
  Administered 2014-11-04: 25 mg via ORAL
  Filled 2014-11-04: qty 1

## 2014-11-04 MED ORDER — NEPRO/CARBSTEADY PO LIQD
237.0000 mL | ORAL | Status: DC | PRN
  Filled 2014-11-04: qty 237

## 2014-11-04 MED ORDER — PENTAFLUOROPROP-TETRAFLUOROETH EX AERO: 1.0000 "application " | INHALATION_SPRAY | CUTANEOUS | Status: DC | PRN

## 2014-11-04 MED ORDER — DIPHENHYDRAMINE HCL 25 MG PO CAPS
ORAL_CAPSULE | ORAL | Status: AC
  Administered 2014-11-04: 25 mg
  Filled 2014-11-04: qty 1

## 2014-11-04 NOTE — ED Notes (Signed)
Spoke with [place] [facility] in dialysis.  Patient transferred to dialysis.

## 2014-11-04 NOTE — ED Notes (Signed)
Pt. is here for her routine hemodialysis treatment , no other complaint .

## 2014-11-04 NOTE — Procedures (Signed)
Sara [name] presents for HD today.  Itching, otherwise no complaints. No SOB.  Feels she is gaining body wt.  Lowest weights in last few weeks are 57-58kg. Today is 65kg.  No gross excess on exam.  Plan UF to 60kg.  Hb has improved after IV Fe added last month.  Due for aranesp next week if Hb < 10-11 cutoff.  Probably can lower dose to 100-150 now that iron stores better. Will order maintenance iron today with 62.5 mg of Nulecit and try to remember to give this weekly.    Old orders from [facility] Feb 2016 > 4 hrs59kg 2K/2CaHeparin 6000AVF R   DateAranespIV ironTsat Ferritin  Hb 11/03/14          12.2 11/01/14       23% 1138  11.6, 8.8 10/30/14 200 ug         8.8 10/16/14       31%   8.1 7/9/16500 mg feraheme 7/4/16200 ug250 mg Nulecit 17% 1171  5.6 10/04/14 100 ug 6/25/167 % 586  6.8 6/21/16200 ug 6/14/16200 ug 6/11/16500 mg feraheme 6/9/16200 ug5% 428  7.3 5/25/16100 ug    I was present at this dialysis session, have reviewed the session itself and made  appropriate changes Kelly Splinter MD (pgr) [phone]    (c) [phone] 09/23/2014, 10:31 AM
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
## 2014-11-06 ENCOUNTER — Encounter (HOSPITAL_COMMUNITY): Payer: Self-pay | Admitting: Emergency Medicine

## 2014-11-06 ENCOUNTER — Non-Acute Institutional Stay (HOSPITAL_COMMUNITY)
Admission: EM | Admit: 2014-11-06 | Discharge: 2014-11-06 | Disposition: A | Discharge status: Home / Self Care | Payer: Medicare Other | Attending: Emergency Medicine | Admitting: Emergency Medicine

## 2014-11-06 DIAGNOSIS — Z79899 Other long term (current) drug therapy: Secondary | ICD-10-CM | POA: Diagnosis not present

## 2014-11-06 DIAGNOSIS — F209 Schizophrenia, unspecified: Secondary | ICD-10-CM | POA: Insufficient documentation

## 2014-11-06 DIAGNOSIS — L299 Pruritus, unspecified: Secondary | ICD-10-CM | POA: Diagnosis not present

## 2014-11-06 DIAGNOSIS — F17210 Nicotine dependence, cigarettes, uncomplicated: Secondary | ICD-10-CM | POA: Insufficient documentation

## 2014-11-06 DIAGNOSIS — N186 End stage renal disease: Secondary | ICD-10-CM | POA: Diagnosis not present

## 2014-11-06 DIAGNOSIS — F319 Bipolar disorder, unspecified: Secondary | ICD-10-CM | POA: Insufficient documentation

## 2014-11-06 DIAGNOSIS — I509 Heart failure, unspecified: Secondary | ICD-10-CM | POA: Diagnosis not present

## 2014-11-06 DIAGNOSIS — Z992 Dependence on renal dialysis: Secondary | ICD-10-CM | POA: Insufficient documentation

## 2014-11-06 DIAGNOSIS — D649 Anemia, unspecified: Secondary | ICD-10-CM | POA: Insufficient documentation

## 2014-11-06 DIAGNOSIS — I120 Hypertensive chronic kidney disease with stage 5 chronic kidney disease or end stage renal disease: Secondary | ICD-10-CM | POA: Diagnosis not present

## 2014-11-06 DIAGNOSIS — M3214 Glomerular disease in systemic lupus erythematosus: Secondary | ICD-10-CM | POA: Diagnosis not present

## 2014-11-06 MED ORDER — DIPHENHYDRAMINE HCL 25 MG PO CAPS
25.0000 mg | ORAL_CAPSULE | Freq: Once | ORAL | Status: AC
  Administered 2014-11-06: 25 mg via ORAL
  Filled 2014-11-06: qty 1

## 2014-11-06 MED ORDER — DIPHENHYDRAMINE HCL 25 MG PO CAPS
25.0000 mg | ORAL_CAPSULE | Freq: Once | ORAL | Status: DC
  Filled 2014-11-06: qty 1
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
## 2014-11-06 NOTE — ED Notes (Signed)
Patient received a snack and drink. 

## 2014-11-06 NOTE — ED Notes (Signed)
Pt here for dialysis. No other complaints

## 2014-11-06 NOTE — ED Notes (Signed)
PT request 10 minutes to finish breakfast before moving back to Pod C.

## 2014-11-06 NOTE — ED Notes (Signed)
Pt walking out of room, states "I can't wait this long, I've got things to do today." Requesting to leave. Does not want to speak with doctor. States "I'll just come back tomorrow." A/O x4 on departure. Ambulatory with steady gait. VSS. NAD.

## 2014-11-06 NOTE — ED Notes (Signed)
Pt moved to room C29. Monitored by pulse ox and bp cuff.

## 2014-11-07 ENCOUNTER — Encounter (HOSPITAL_COMMUNITY): Payer: Self-pay | Admitting: *Deleted

## 2014-11-07 ENCOUNTER — Non-Acute Institutional Stay (HOSPITAL_COMMUNITY)
Admission: EM | Admit: 2014-11-07 | Discharge: 2014-11-07 | Disposition: A | Discharge status: Home / Self Care | Payer: Medicare Other | Attending: Emergency Medicine | Admitting: Emergency Medicine

## 2014-11-07 DIAGNOSIS — I509 Heart failure, unspecified: Secondary | ICD-10-CM | POA: Insufficient documentation

## 2014-11-07 DIAGNOSIS — N186 End stage renal disease: Secondary | ICD-10-CM | POA: Insufficient documentation

## 2014-11-07 DIAGNOSIS — I120 Hypertensive chronic kidney disease with stage 5 chronic kidney disease or end stage renal disease: Secondary | ICD-10-CM | POA: Diagnosis not present

## 2014-11-07 DIAGNOSIS — M329 Systemic lupus erythematosus, unspecified: Secondary | ICD-10-CM | POA: Diagnosis not present

## 2014-11-07 DIAGNOSIS — F17210 Nicotine dependence, cigarettes, uncomplicated: Secondary | ICD-10-CM | POA: Diagnosis not present

## 2014-11-07 DIAGNOSIS — Z992 Dependence on renal dialysis: Secondary | ICD-10-CM | POA: Diagnosis not present

## 2014-11-07 LAB — RENAL FUNCTION PANEL
Albumin: 2.9 g/dL — ABNORMAL LOW (ref 3.5–5.0)
Anion gap: 14 (ref 5–15)
BUN: 64 mg/dL — AB (ref 6–20)
CO2: 23 mmol/L (ref 22–32)
CREATININE: 11.79 mg/dL — AB (ref 0.44–1.00)
Calcium: 9 mg/dL (ref 8.9–10.3)
Chloride: 99 mmol/L — ABNORMAL LOW (ref 101–111)
GFR calc Af Amer: 4 mL/min — ABNORMAL LOW (ref >60)
GFR, EST NON AFRICAN AMERICAN: 4 mL/min — AB (ref >60)
Glucose, Bld: 84 mg/dL (ref 65–99)
POTASSIUM: 4.6 mmol/L (ref 3.5–5.1)
Phosphorus: 11 mg/dL — ABNORMAL HIGH (ref 2.5–4.6)
Sodium: 136 mmol/L (ref 135–145)

## 2014-11-07 LAB — CBC
HCT: 29.6 % — ABNORMAL LOW (ref 36.0–46.0)
HEMOGLOBIN: 9.2 g/dL — AB (ref 12.0–15.0)
MCH: 27.4 pg (ref 26.0–34.0)
MCHC: 31.1 g/dL (ref 30.0–36.0)
MCV: 88.1 fL (ref 78.0–100.0)
Platelets: 147 10*3/uL — ABNORMAL LOW (ref 150–400)
RBC: 3.36 MIL/uL — ABNORMAL LOW (ref 3.87–5.11)
RDW: 19.5 % — ABNORMAL HIGH (ref 11.5–15.5)
WBC: 3.8 10*3/uL — ABNORMAL LOW (ref 4.0–10.5)

## 2014-11-07 MED ORDER — PENTAFLUOROPROP-TETRAFLUOROETH EX AERO: 1.0000 "application " | INHALATION_SPRAY | CUTANEOUS | Status: DC | PRN

## 2014-11-07 MED ORDER — HEPARIN SODIUM (PORCINE) 1000 UNIT/ML DIALYSIS: 1000.0000 [IU] | INTRAMUSCULAR | Status: DC | PRN

## 2014-11-07 MED ORDER — LIDOCAINE-PRILOCAINE 2.5-2.5 % EX CREA: 1.0000 "application " | TOPICAL_CREAM | CUTANEOUS | Status: DC | PRN

## 2014-11-07 MED ORDER — SODIUM CHLORIDE 0.9 % IV SOLN: 100.0000 mL | INTRAVENOUS | Status: DC | PRN

## 2014-11-07 MED ORDER — LIDOCAINE HCL (PF) 1 % IJ SOLN: 5.0000 mL | INTRAMUSCULAR | Status: DC | PRN

## 2014-11-07 MED ORDER — NEPRO/CARBSTEADY PO LIQD
237.0000 mL | ORAL | Status: DC | PRN
  Filled 2014-11-07: qty 237

## 2014-11-07 MED ORDER — ALTEPLASE 2 MG IJ SOLR
2.0000 mg | Freq: Once | INTRAMUSCULAR | Status: AC | PRN
  Filled 2014-11-07: qty 2

## 2014-11-07 MED ORDER — ACETAMINOPHEN 325 MG PO TABS
650.0000 mg | ORAL_TABLET | Freq: Once | ORAL | Status: AC
  Administered 2014-11-07: 650 mg via ORAL

## 2014-11-07 MED ORDER — LIDOCAINE-PRILOCAINE 2.5-2.5 % EX CREA
1.0000 "application " | TOPICAL_CREAM | CUTANEOUS | Status: DC | PRN
  Filled 2014-11-07: qty 5

## 2014-11-07 MED ORDER — ACETAMINOPHEN 325 MG PO TABS
ORAL_TABLET | ORAL | Status: AC
  Filled 2014-11-07: qty 2

## 2014-11-07 MED ORDER — ALTEPLASE 2 MG IJ SOLR: 2.0000 mg | Freq: Once | INTRAMUSCULAR | Status: DC | PRN

## 2014-11-07 MED ORDER — NEPRO/CARBSTEADY PO LIQD: 237.0000 mL | ORAL | Status: DC | PRN

## 2014-11-07 NOTE — ED Notes (Signed)
Pt reports being here for dialysis, no complaints. Last treatment was Saturday. No distress noted at triage.

## 2014-11-07 NOTE — ED Notes (Signed)
MD at bedside. 
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
## 2014-11-11 ENCOUNTER — Encounter (HOSPITAL_COMMUNITY): Payer: Self-pay | Admitting: Emergency Medicine

## 2014-11-11 ENCOUNTER — Emergency Department (HOSPITAL_COMMUNITY): Payer: Medicare Other

## 2014-11-11 ENCOUNTER — Non-Acute Institutional Stay (HOSPITAL_COMMUNITY)
Admission: EM | Admit: 2014-11-11 | Discharge: 2014-11-11 | Disposition: A | Discharge status: Home / Self Care | Payer: Medicare Other | Attending: Emergency Medicine | Admitting: Emergency Medicine

## 2014-11-11 DIAGNOSIS — Z992 Dependence on renal dialysis: Secondary | ICD-10-CM | POA: Insufficient documentation

## 2014-11-11 DIAGNOSIS — E875 Hyperkalemia: Secondary | ICD-10-CM | POA: Insufficient documentation

## 2014-11-11 DIAGNOSIS — M3214 Glomerular disease in systemic lupus erythematosus: Secondary | ICD-10-CM | POA: Insufficient documentation

## 2014-11-11 DIAGNOSIS — F17210 Nicotine dependence, cigarettes, uncomplicated: Secondary | ICD-10-CM | POA: Insufficient documentation

## 2014-11-11 DIAGNOSIS — I129 Hypertensive chronic kidney disease with stage 1 through stage 4 chronic kidney disease, or unspecified chronic kidney disease: Secondary | ICD-10-CM | POA: Diagnosis not present

## 2014-11-11 DIAGNOSIS — I509 Heart failure, unspecified: Secondary | ICD-10-CM | POA: Diagnosis not present

## 2014-11-11 DIAGNOSIS — X58XXXA Exposure to other specified factors, initial encounter: Secondary | ICD-10-CM | POA: Diagnosis not present

## 2014-11-11 DIAGNOSIS — F319 Bipolar disorder, unspecified: Secondary | ICD-10-CM | POA: Diagnosis not present

## 2014-11-11 DIAGNOSIS — N186 End stage renal disease: Secondary | ICD-10-CM | POA: Diagnosis not present

## 2014-11-11 DIAGNOSIS — I1311 Hypertensive heart and chronic kidney disease without heart failure, with stage 5 chronic kidney disease, or end stage renal disease: Secondary | ICD-10-CM | POA: Diagnosis not present

## 2014-11-11 DIAGNOSIS — Z9115 Patient's noncompliance with renal dialysis: Secondary | ICD-10-CM | POA: Diagnosis not present

## 2014-11-11 DIAGNOSIS — T148XXA Other injury of unspecified body region, initial encounter: Secondary | ICD-10-CM

## 2014-11-11 DIAGNOSIS — D649 Anemia, unspecified: Secondary | ICD-10-CM | POA: Insufficient documentation

## 2014-11-11 DIAGNOSIS — S30814A Abrasion of vagina and vulva, initial encounter: Secondary | ICD-10-CM | POA: Insufficient documentation

## 2014-11-11 DIAGNOSIS — F209 Schizophrenia, unspecified: Secondary | ICD-10-CM | POA: Insufficient documentation

## 2014-11-11 DIAGNOSIS — M25512 Pain in left shoulder: Secondary | ICD-10-CM | POA: Diagnosis not present

## 2014-11-11 DIAGNOSIS — S30811A Abrasion of abdominal wall, initial encounter: Secondary | ICD-10-CM | POA: Diagnosis not present

## 2014-11-11 DIAGNOSIS — Z79899 Other long term (current) drug therapy: Secondary | ICD-10-CM | POA: Insufficient documentation

## 2014-11-11 DIAGNOSIS — L0291 Cutaneous abscess, unspecified: Secondary | ICD-10-CM | POA: Diagnosis present

## 2014-11-11 LAB — CBC WITH DIFFERENTIAL/PLATELET
BASOS PCT: 0 % (ref 0–1)
Basophils Absolute: 0 10*3/uL (ref 0.0–0.1)
EOS ABS: 0 10*3/uL (ref 0.0–0.7)
EOS PCT: 0 % (ref 0–5)
HCT: 30.8 % — ABNORMAL LOW (ref 36.0–46.0)
HEMOGLOBIN: 9.8 g/dL — AB (ref 12.0–15.0)
LYMPHS ABS: 1 10*3/uL (ref 0.7–4.0)
Lymphocytes Relative: 23 % (ref 12–46)
MCH: 28.4 pg (ref 26.0–34.0)
MCHC: 31.8 g/dL (ref 30.0–36.0)
MCV: 89.3 fL (ref 78.0–100.0)
Monocytes Absolute: 0.4 10*3/uL (ref 0.1–1.0)
Monocytes Relative: 8 % (ref 3–12)
NEUTROS ABS: 3.1 10*3/uL (ref 1.7–7.7)
Neutrophils Relative %: 69 % (ref 43–77)
PLATELETS: 167 10*3/uL (ref 150–400)
RBC: 3.45 MIL/uL — ABNORMAL LOW (ref 3.87–5.11)
RDW: 19.1 % — AB (ref 11.5–15.5)
WBC: 4.5 10*3/uL (ref 4.0–10.5)

## 2014-11-11 LAB — BASIC METABOLIC PANEL
Anion gap: 20 — ABNORMAL HIGH (ref 5–15)
BUN: 97 mg/dL — ABNORMAL HIGH (ref 6–20)
CALCIUM: 9.3 mg/dL (ref 8.9–10.3)
CHLORIDE: 98 mmol/L — AB (ref 101–111)
CO2: 18 mmol/L — AB (ref 22–32)
CREATININE: 15.43 mg/dL — AB (ref 0.44–1.00)
GFR calc non Af Amer: 3 mL/min — ABNORMAL LOW (ref >60)
GFR, EST AFRICAN AMERICAN: 3 mL/min — AB (ref >60)
Glucose, Bld: 90 mg/dL (ref 65–99)
POTASSIUM: 5.3 mmol/L — AB (ref 3.5–5.1)
Sodium: 136 mmol/L (ref 135–145)

## 2014-11-11 LAB — I-STAT CHEM 8, ED
BUN: 95 mg/dL — AB (ref 6–20)
Calcium, Ion: 1.08 mmol/L — ABNORMAL LOW (ref 1.12–1.23)
Chloride: 105 mmol/L (ref 101–111)
Creatinine, Ser: 14.8 mg/dL — ABNORMAL HIGH (ref 0.44–1.00)
Glucose, Bld: 92 mg/dL (ref 65–99)
HCT: 31 % — ABNORMAL LOW (ref 36.0–46.0)
HEMOGLOBIN: 10.5 g/dL — AB (ref 12.0–15.0)
Potassium: 5.2 mmol/L — ABNORMAL HIGH (ref 3.5–5.1)
SODIUM: 134 mmol/L — AB (ref 135–145)
TCO2: 18 mmol/L (ref 0–100)

## 2014-11-11 MED ORDER — DIPHENHYDRAMINE HCL 25 MG PO CAPS
ORAL_CAPSULE | ORAL | Status: AC
  Filled 2014-11-11: qty 1

## 2014-11-11 MED ORDER — DARBEPOETIN ALFA 60 MCG/0.3ML IJ SOSY
60.0000 ug | PREFILLED_SYRINGE | INTRAMUSCULAR | Status: DC
  Administered 2014-11-11: 60 ug via INTRAVENOUS

## 2014-11-11 MED ORDER — HYDROCODONE-ACETAMINOPHEN 5-325 MG PO TABS
2.0000 | ORAL_TABLET | Freq: Once | ORAL | Status: AC
  Administered 2014-11-11: 2 via ORAL
  Filled 2014-11-11: qty 2

## 2014-11-11 MED ORDER — DARBEPOETIN ALFA 60 MCG/0.3ML IJ SOSY
PREFILLED_SYRINGE | INTRAMUSCULAR | Status: AC
  Filled 2014-11-11: qty 0.3

## 2014-11-11 MED ORDER — SODIUM CHLORIDE 0.9 % IV SOLN
125.0000 mg | Freq: Once | INTRAVENOUS | Status: AC
  Administered 2014-11-11: 125 mg via INTRAVENOUS
  Filled 2014-11-11: qty 10

## 2014-11-11 MED ORDER — DIPHENHYDRAMINE HCL 25 MG PO CAPS
25.0000 mg | ORAL_CAPSULE | Freq: Four times a day (QID) | ORAL | Status: DC | PRN
  Administered 2014-11-11: 25 mg via ORAL
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
## 2014-11-11 NOTE — Progress Notes (Signed)
Patient ended dialysis treatment early did not receive full dose of nulecit,, left unit alert oriented and vitals stable....Marland KitchenMarland Kitchen

## 2014-11-11 NOTE — ED Notes (Signed)
Pt in from home C/O bumps on face that appeared 2 weeks ago. Pt also reports "bump" in vaginal area that appears 2 days ago.

## 2014-11-11 NOTE — Discharge Instructions (Signed)
If you were given medicines take as directed.  If you are on coumadin or contraceptives realize their levels and effectiveness is altered by many different medicines.  If you have any reaction (rash, tongues swelling, other) to the medicines stop taking and see a physician.    If your blood pressure was elevated in the ER make sure you follow up for management with a primary doctor or return for chest pain, shortness of breath or stroke symptoms.  Please follow up as directed and return to the ER or see a physician for new or worsening symptoms.  Thank you. Filed Vitals:   11/11/14 0559 11/11/14 0615 11/11/14 0645 11/11/14 0647  BP:    146/97  Pulse: 114 119 104 108  Temp:      TempSrc:      Resp:      SpO2: 93% 97% 96% 97%
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
## 2014-11-14 ENCOUNTER — Non-Acute Institutional Stay (HOSPITAL_COMMUNITY)
Admission: EM | Admit: 2014-11-14 | Discharge: 2014-11-14 | Disposition: A | Discharge status: Home / Self Care | Payer: Medicare Other | Attending: Emergency Medicine | Admitting: Emergency Medicine

## 2014-11-14 DIAGNOSIS — I120 Hypertensive chronic kidney disease with stage 5 chronic kidney disease or end stage renal disease: Secondary | ICD-10-CM | POA: Insufficient documentation

## 2014-11-14 DIAGNOSIS — F17210 Nicotine dependence, cigarettes, uncomplicated: Secondary | ICD-10-CM | POA: Diagnosis not present

## 2014-11-14 DIAGNOSIS — F319 Bipolar disorder, unspecified: Secondary | ICD-10-CM | POA: Diagnosis not present

## 2014-11-14 DIAGNOSIS — D649 Anemia, unspecified: Secondary | ICD-10-CM | POA: Diagnosis not present

## 2014-11-14 DIAGNOSIS — N186 End stage renal disease: Secondary | ICD-10-CM | POA: Diagnosis not present

## 2014-11-14 DIAGNOSIS — I509 Heart failure, unspecified: Secondary | ICD-10-CM | POA: Diagnosis not present

## 2014-11-14 DIAGNOSIS — M3214 Glomerular disease in systemic lupus erythematosus: Secondary | ICD-10-CM | POA: Insufficient documentation

## 2014-11-14 DIAGNOSIS — Z992 Dependence on renal dialysis: Secondary | ICD-10-CM | POA: Diagnosis not present

## 2014-11-14 DIAGNOSIS — F209 Schizophrenia, unspecified: Secondary | ICD-10-CM | POA: Diagnosis not present

## 2014-11-14 DIAGNOSIS — L309 Dermatitis, unspecified: Secondary | ICD-10-CM | POA: Diagnosis not present

## 2014-11-14 DIAGNOSIS — Z79899 Other long term (current) drug therapy: Secondary | ICD-10-CM | POA: Diagnosis not present

## 2014-11-14 DIAGNOSIS — M25512 Pain in left shoulder: Secondary | ICD-10-CM | POA: Diagnosis not present

## 2014-11-14 LAB — BASIC METABOLIC PANEL
Anion gap: 19 — ABNORMAL HIGH (ref 5–15)
BUN: 99 mg/dL — ABNORMAL HIGH (ref 6–20)
CHLORIDE: 98 mmol/L — AB (ref 101–111)
CO2: 21 mmol/L — ABNORMAL LOW (ref 22–32)
Calcium: 9 mg/dL (ref 8.9–10.3)
Creatinine, Ser: 14.52 mg/dL — ABNORMAL HIGH (ref 0.44–1.00)
GFR calc Af Amer: 3 mL/min — ABNORMAL LOW (ref >60)
GFR, EST NON AFRICAN AMERICAN: 3 mL/min — AB (ref >60)
Glucose, Bld: 73 mg/dL (ref 65–99)
Potassium: 4.7 mmol/L (ref 3.5–5.1)
SODIUM: 138 mmol/L (ref 135–145)

## 2014-11-14 MED ORDER — DIPHENHYDRAMINE HCL 25 MG PO CAPS
25.0000 mg | ORAL_CAPSULE | Freq: Once | ORAL | Status: AC
  Administered 2014-11-14: 25 mg via ORAL
  Filled 2014-11-14: qty 1

## 2014-11-14 MED ORDER — ACETAMINOPHEN 325 MG PO TABS
650.0000 mg | ORAL_TABLET | Freq: Once | ORAL | Status: AC
  Administered 2014-11-14: 650 mg via ORAL
  Filled 2014-11-14: qty 2

## 2014-11-14 NOTE — ED Notes (Addendum)
Dr. Roderic Palau made aware of pt wishes to have pain med prescription for arm pain and also dermatologist referral. Dr. Roderic Palau states pt can take tylenol or ibuprofen at home and follow up with PCP for dermatologist referral. Pt upset and raising voice at this RN stating "I take tylenol and it dont help. I have a bump on my cootchie and need a dermatologist."   At same time, pt diet tray delivered and pt upset stating "this aint what I want" RN advised pt that when she left to go outside a standard diet tray was ordered for pt.

## 2014-11-14 NOTE — ED Notes (Signed)
Pt at nursing station cursing and yelling at this RN, states she would like to leave due to dialysis RN she spoke to say that she will have to wait 6 more hours. This RN states she will have pt sign out AMA, pt yelling and saying "Im tired of the bullshit and people telling me lies." this RN offered to look up a dermatologist for pt to call, pt states she can do it herself. RN attempting to get pt to sign AMA for, pt walking towards charge station continuing to curse at this RN. Security called.

## 2014-11-14 NOTE — ED Notes (Signed)
Need dialysis

## 2014-11-14 NOTE — ED Notes (Addendum)
Pt at nurses station requesting to speak with nutritional services so that she can order he own lunch, this RN explained to pt that this RN is on hold to place lunch order. Pt states she wants to pick what she can eat, this RN explained that a renal diet tray will have to be ordered based on pt history. Pt refusing to go back to room at this time. RN explained security would be called if pt did not go back to room, this RN would let pt know of her choices for lunch when nutritional services was available.

## 2014-11-14 NOTE — ED Notes (Signed)
Pt requesting something for pain and itching, Dr. Ashok Cordia made aware.

## 2014-11-14 NOTE — ED Notes (Signed)
Ordered renal diet for pt.

## 2014-11-14 NOTE — Care Management (Signed)
Pt asked to talk privately about her not getting dermatologist referral and she wasn't updated on results from exams done on her arm.  NCM read results from DG left shoulder on 8/6.  Pt states she is still having pain in that arm.  NCM suggested she ask for pain medication Rx as pt states she has nothing for pain at home.    Regarding dermatologist referral; NCM suggested she remind EDP of referral need prior to d/c home today.  Reeva Davern J. Clydene Laming, [place], [place], [place]

## 2014-11-14 NOTE — ED Notes (Signed)
Pt resting, updated on wait for dialysis.
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
## 2014-11-14 NOTE — ED Notes (Signed)
Renal diet ordered 

## 2014-11-14 NOTE — ED Notes (Signed)
States she is going to go outside to give something to someone and then return. Requested for pt to remain in her room. Pt stated she really needs to go and will return. States dialysis had advised they are not coming to pick her up until 1830. Danielle, RN, aware and advised pt of same. Rosalyn Charters, Charge RN, AD, also aware.

## 2014-11-14 NOTE — ED Notes (Signed)
Patient requesting dinner before she goes upstairs.

## 2014-11-15 ENCOUNTER — Emergency Department (HOSPITAL_COMMUNITY)
Admission: EM | Admit: 2014-11-15 | Discharge: 2014-11-15 | Discharge status: Against Medical Advice | Payer: Medicare Other | Attending: Emergency Medicine | Admitting: Emergency Medicine

## 2014-11-15 ENCOUNTER — Encounter (HOSPITAL_COMMUNITY): Payer: Self-pay | Admitting: *Deleted

## 2014-11-15 DIAGNOSIS — I120 Hypertensive chronic kidney disease with stage 5 chronic kidney disease or end stage renal disease: Secondary | ICD-10-CM | POA: Diagnosis not present

## 2014-11-15 DIAGNOSIS — Z992 Dependence on renal dialysis: Secondary | ICD-10-CM

## 2014-11-15 DIAGNOSIS — M79602 Pain in left arm: Secondary | ICD-10-CM | POA: Diagnosis not present

## 2014-11-15 DIAGNOSIS — N186 End stage renal disease: Secondary | ICD-10-CM | POA: Diagnosis not present

## 2014-11-15 DIAGNOSIS — Z720 Tobacco use: Secondary | ICD-10-CM | POA: Diagnosis not present

## 2014-11-15 DIAGNOSIS — Z4932 Encounter for adequacy testing for peritoneal dialysis: Secondary | ICD-10-CM | POA: Diagnosis not present

## 2014-11-15 DIAGNOSIS — I509 Heart failure, unspecified: Secondary | ICD-10-CM | POA: Diagnosis not present

## 2014-11-15 LAB — I-STAT CHEM 8, ED
BUN: 111 mg/dL — ABNORMAL HIGH (ref 6–20)
CALCIUM ION: 1.14 mmol/L (ref 1.12–1.23)
Chloride: 101 mmol/L (ref 101–111)
Creatinine, Ser: 16.1 mg/dL — ABNORMAL HIGH (ref 0.44–1.00)
Glucose, Bld: 89 mg/dL (ref 65–99)
HEMATOCRIT: 31 % — AB (ref 36.0–46.0)
Hemoglobin: 10.5 g/dL — ABNORMAL LOW (ref 12.0–15.0)
Potassium: 4.9 mmol/L (ref 3.5–5.1)
Sodium: 137 mmol/L (ref 135–145)
TCO2: 20 mmol/L (ref 0–100)

## 2014-11-15 LAB — RPR: RPR Ser Ql: NONREACTIVE

## 2014-11-15 NOTE — ED Notes (Signed)
The pt states she called the dialysis unit and was told that she may not be dialyzed tonight so she is leaving to go to work and may come back later.

## 2014-11-15 NOTE — ED Notes (Signed)
Pt states that she needs dialysis and has left arm pain.

## 2014-11-16 ENCOUNTER — Encounter (HOSPITAL_COMMUNITY): Payer: Self-pay | Admitting: *Deleted

## 2014-11-16 ENCOUNTER — Encounter: Payer: Self-pay | Admitting: Family Medicine

## 2014-11-16 ENCOUNTER — Ambulatory Visit (INDEPENDENT_AMBULATORY_CARE_PROVIDER_SITE_OTHER): Payer: Medicare Other | Admitting: Family Medicine

## 2014-11-16 ENCOUNTER — Non-Acute Institutional Stay (HOSPITAL_COMMUNITY)
Admission: EM | Admit: 2014-11-16 | Discharge: 2014-11-16 | Disposition: A | Discharge status: Home / Self Care | Payer: Medicare Other | Attending: Emergency Medicine | Admitting: Emergency Medicine

## 2014-11-16 VITALS — BP 168/109 | HR 99 | Temp 97.9°F | Ht 68.0 in | Wt 142.6 lb

## 2014-11-16 DIAGNOSIS — R21 Rash and other nonspecific skin eruption: Secondary | ICD-10-CM | POA: Diagnosis not present

## 2014-11-16 DIAGNOSIS — N186 End stage renal disease: Secondary | ICD-10-CM | POA: Diagnosis present

## 2014-11-16 DIAGNOSIS — I120 Hypertensive chronic kidney disease with stage 5 chronic kidney disease or end stage renal disease: Secondary | ICD-10-CM | POA: Diagnosis not present

## 2014-11-16 DIAGNOSIS — Z992 Dependence on renal dialysis: Secondary | ICD-10-CM | POA: Diagnosis not present

## 2014-11-16 MED ORDER — DIPHENHYDRAMINE HCL 25 MG PO CAPS
ORAL_CAPSULE | ORAL | Status: AC
  Filled 2014-11-16: qty 1

## 2014-11-16 MED ORDER — ACETAMINOPHEN 325 MG PO TABS
ORAL_TABLET | ORAL | Status: AC
  Filled 2014-11-16: qty 2

## 2014-11-16 MED ORDER — DIPHENHYDRAMINE HCL 25 MG PO CAPS
25.0000 mg | ORAL_CAPSULE | Freq: Once | ORAL | Status: AC
  Administered 2014-11-16: 25 mg via ORAL

## 2014-11-16 MED ORDER — ACETAMINOPHEN 325 MG PO TABS
650.0000 mg | ORAL_TABLET | Freq: Once | ORAL | Status: AC
  Administered 2014-11-16: 650 mg via ORAL

## 2014-11-16 NOTE — ED Notes (Signed)
Breakfast tray ordered 
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
## 2014-11-16 NOTE — ED Notes (Signed)
Pt to dialysis via stretcher. Pt alertx4 respirations easy non labored

## 2014-11-16 NOTE — ED Notes (Signed)
Hemodialysis called and states they will be ready for pt around 1100.

## 2014-11-16 NOTE — Progress Notes (Signed)
Patient ID: Sara [name], female   DOB: January 01, 1981, 34 y.o.   MRN: [id number]  HPI:  Patient presents for a same day appointment, requesting to be seen for a rash and shoulder pain. She arrives 30 minutes late for her appointment. Knowing her difficult social situation, I agreed to still see her today. She gets dialysis at [facility] via presenting to the ER regularly, as no outpatient dialysis center will accept her. She had dialysis earlier today. It was explained to her at the front desk that because this was a same day appointment and she had exceeded our late policy, we would only be able to address one issue today. A second visit was thus scheduled for her on Monday, to be seen for her shoulder pain by Dr. Ree Kida.  Her chief complaint today is bumps she has over her entire body. Has had these bumps for several months. They are pruritic. She feels itching and then scratches, and that's when she realizes there is a bump. She squeezes the bumps and gets a clear liquid out. Patient is homeless and reports that she was kicked out of the shelter she lived in. Has been staying with friends since then. States none of her friends have had this rash and she does not believe that she's been bitten by any bugs. She saw a dermatologist for this several months ago and was told this was irritation of her hair follicles. She is unable to recall if this rash is exactly the same or different as the one she had previously when she saw the dermatologist. Denies fevers or problematic sores in her mouth.   Reports that she has a new bump on her vagina and states that is why she came in today. Endorses heavy vaginal discharge and also pelvic pain occuring 4-5 times per day. Not recently sexually active, and has only had one female partner in the last year.   When offered pelvic exam to examine the new bump in vulvar area and assess pelvic pain and vaginal discharge, patient declined pelvic exam. She states the bump in vaginal  area is the same as the rest of the bumps on her body and therefore does not need to be examined. States it is not possible she is pregnant due to no recent sexual activity. Reports [facility]. Was called back there due to a reported positive syphilis test, but says this was a false positive and she was told she does not have syphilis.  ROS: See HPI  [place]: hx acute myopericarditis in 2/16, chronic CHF, anemia, bipolar/schizophrenia/schizoaffective disorder, ESRD on dialysis, genital herpes, HTN, SLE, homelessness  PHYSICAL EXAM: BP 168/109 mmHg  Pulse 99  Temp(Src) 97.9 F (36.6 C) (Oral)  Ht 5\' 8"  (1.727 m)  Wt 142 lb 9.6 oz (64.683 kg)  BMI 21.69 kg/m2 Gen: no acute distress. Slightly disheveled in appearance HEENT: NCAT. Oropharynx clear and moist Lungs: normal respiratory effort Neuro: speech normal, grossly nonfocal Ext: large AV fistula to R arm with bandaid in place Psych: irritated and somewhat abrasive affect. Does not seem to be responding to internal stimuli. Disheveled slightly in appearance. Skin: scattered scarred papules on arms, back, face. Some newer in appearance. One which patient points to on L arm as being new is a very small papule that she picks at during the visit. No erythema or other skin breakdown  ASSESSMENT/PLAN:  1. Rash - seems mostly related to patient picking at skin. May also be due  to insect bites (patient known to be homeless). Doubt secondary syphilis as palms and soles are spared and patient reports negative syphilis testing recently. Offered several options for treatment and evaluation to patient: - recommended cleaning skin well and avoiding picking. Patient skeptical of this advice. - offered rx for topical steroid cream for itch relief. Patient declined this, stating it would not help and she would not use it - offered to have patient return for skin biopsy to further evaluate etiology. Patient dissatisfied  with this offer, saying she had biopsy previously. Said she would just "have to see a dermatologist" - then offered to refer patient to dermatology to expedite her being seen there. She refused this, saying she will call to make her own appointment. I offered to help locate the phone number for the dermatology office. This seemed to further irritate the patient and she said she would use the yellow pages to find the phone number.  Pt increasingly irate during this discussion. Pt then requested that we talk about her shoulder pain. When I explained that no we could not, as we were already out of time, she became irate and stated loudly that this visit had been a "f**king waste of an appointment" and said that I had not done anything for her other than "try to look at my vagina the whole time". I tried to reassure her that I had only offered to do a pelvic exam to evaluate the symptoms she reported, and that I respected her choice to decline a pelvic exam.   She then said she had wasted $3 on the visit and was angry she would have to pay another $3 to be seen on Monday for her shoulder pain. She then stormed out of the visit and left the clinic.   Show Low. Ardelia Mems, [place]

## 2014-11-16 NOTE — ED Notes (Signed)
Pt reports needing dialysis and also wants eval of left shoulder pain. No acute distress noted.

## 2014-11-16 NOTE — Progress Notes (Signed)
Patient received dialysis treatment 3 hour duration removed 3.5 liters, self ambulated from unit over dry weight, alert oriented vitals stable.Sara KitchenMarland [name]

## 2014-11-18 ENCOUNTER — Emergency Department (HOSPITAL_COMMUNITY)
Admission: EM | Admit: 2014-11-18 | Discharge: 2014-11-18 | Disposition: A | Discharge status: Home / Self Care | Payer: Medicare Other | Attending: Emergency Medicine | Admitting: Emergency Medicine

## 2014-11-18 ENCOUNTER — Encounter (HOSPITAL_COMMUNITY): Payer: Self-pay | Admitting: Emergency Medicine

## 2014-11-18 DIAGNOSIS — Z79899 Other long term (current) drug therapy: Secondary | ICD-10-CM | POA: Insufficient documentation

## 2014-11-18 DIAGNOSIS — R21 Rash and other nonspecific skin eruption: Secondary | ICD-10-CM | POA: Diagnosis not present

## 2014-11-18 DIAGNOSIS — Z992 Dependence on renal dialysis: Secondary | ICD-10-CM | POA: Diagnosis not present

## 2014-11-18 DIAGNOSIS — Z4931 Encounter for adequacy testing for hemodialysis: Secondary | ICD-10-CM | POA: Diagnosis not present

## 2014-11-18 DIAGNOSIS — I509 Heart failure, unspecified: Secondary | ICD-10-CM | POA: Insufficient documentation

## 2014-11-18 DIAGNOSIS — Z720 Tobacco use: Secondary | ICD-10-CM | POA: Diagnosis not present

## 2014-11-18 DIAGNOSIS — Z4901 Encounter for fitting and adjustment of extracorporeal dialysis catheter: Secondary | ICD-10-CM | POA: Diagnosis not present

## 2014-11-18 DIAGNOSIS — Z8739 Personal history of other diseases of the musculoskeletal system and connective tissue: Secondary | ICD-10-CM | POA: Insufficient documentation

## 2014-11-18 DIAGNOSIS — F29 Unspecified psychosis not due to a substance or known physiological condition: Secondary | ICD-10-CM | POA: Diagnosis not present

## 2014-11-18 DIAGNOSIS — I120 Hypertensive chronic kidney disease with stage 5 chronic kidney disease or end stage renal disease: Secondary | ICD-10-CM | POA: Diagnosis not present

## 2014-11-18 DIAGNOSIS — Z862 Personal history of diseases of the blood and blood-forming organs and certain disorders involving the immune mechanism: Secondary | ICD-10-CM | POA: Diagnosis not present

## 2014-11-18 DIAGNOSIS — N186 End stage renal disease: Secondary | ICD-10-CM | POA: Diagnosis not present

## 2014-11-18 MED ORDER — DARBEPOETIN ALFA 60 MCG/0.3ML IJ SOSY
60.0000 ug | PREFILLED_SYRINGE | Freq: Once | INTRAMUSCULAR | Status: DC
  Filled 2014-11-18: qty 0.3

## 2014-11-18 MED ORDER — DIPHENHYDRAMINE HCL 25 MG PO CAPS
25.0000 mg | ORAL_CAPSULE | Freq: Once | ORAL | Status: AC
  Administered 2014-11-18: 25 mg via ORAL
  Filled 2014-11-18: qty 1

## 2014-11-18 MED ORDER — ACETAMINOPHEN 325 MG PO TABS
650.0000 mg | ORAL_TABLET | Freq: Once | ORAL | Status: AC
  Administered 2014-11-18: 650 mg via ORAL
  Filled 2014-11-18: qty 2

## 2014-11-18 NOTE — ED Notes (Signed)
PA at bedside.

## 2014-11-18 NOTE — ED Notes (Addendum)
Pt ambulatory to desk - asking for cup of ice and coffee w/2 cream and 8 sugars. Given to pt.

## 2014-11-18 NOTE — ED Notes (Signed)
Pt given Sprite as requested. Pt was just given Ginger Ale - states she does not like the way it tastes.

## 2014-11-18 NOTE — ED Notes (Signed)
Pt on phone at nurses' desk - calling dialysis back. Advised them she will wait until 1115 for dialysis. Pt then ambulated to CM's office and requested for Jeannie to come to her room when available. Pt then returned to her room.

## 2014-11-18 NOTE — ED Notes (Signed)
Pt refused to ride in w/c to dialysis. Pt being transported via stretcher. Pt given belongings bag and placed all of her items in it.

## 2014-11-18 NOTE — Procedures (Signed)
Concerned about her AVF, having pain when not on dialysis, it is slowly increasing in size and it is taking longer for bleeding to stop after dialysis. Placed in 2014 here by [facility]. No other complaints.   CBC pending today.   Old orders from [facility] Feb 2016 > 4 hrs59kg 2K/2CaHeparin 6000AVF R   DateAranespIV ironTsatFerritin Hb 8/13 (today) 60 ug         10.5 11/15/14          10.5 11/11/14  60 ug         9.8 11/07/14           9.2 11/03/1610.2 10/31/1621%113811.6, 8.8 7/25/16200 ug8.8 10/15/1629%8.1 7/9/16500 mg feraheme 7/4/16200 ug250 mg Nulecit 17%11715.6 10/04/14 100 ug 6/25/167 %5866.8 6/21/16200 ug 6/14/16200 ug 6/11/16500 mg feraheme 6/9/16200 ug5%4287.3 5/25/16100 ug   Kelly Splinter MD (pgr) [phone]    (c) [phone] 11/18/2014, 10:48 AM
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
## 2014-11-18 NOTE — ED Notes (Signed)
Ordered lunch tray 

## 2014-11-18 NOTE — ED Notes (Addendum)
Pt on phone at nurses' desk talking w/dialysis staff. Dane, RN, dialysis, advised to bring pt around 1125. RN spoke w/Svc Response re: pt's lunch. Advised kitchen is not ready to bring her lunch at this time. Aware pt is going to dialysis.

## 2014-11-18 NOTE — Progress Notes (Signed)
Pt requested to stop tx after dialyzing for 1.5 hrs; advised the pt to stay for the pt to stay for the duration of her tx; pt states she has to go. Pt muttering incoherent words, cursing and erratic. Situation on the unit getting uneasy d/t to her behavior; almost called security on pt. tx stopped; vital signs obtained, refuses to dress her fistula but she refused extra tape and pt ambulated off the unit.
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
## 2014-11-18 NOTE — ED Notes (Signed)
Sara [name], CM, in w/pt.

## 2014-11-18 NOTE — ED Notes (Signed)
Pt requesting pain med, benadryl and breakfast at same time. Althea Grimmer RN aware.

## 2014-11-18 NOTE — ED Notes (Signed)
Pt reports that she is here for dialysis. Pt also reports left shoulder pain and bump to right pointer finger. NAD at this time. Pt alert x4.

## 2014-11-18 NOTE — ED Notes (Signed)
Pt not in room as of yet.

## 2014-11-19 ENCOUNTER — Emergency Department (HOSPITAL_COMMUNITY)
Admission: EM | Admit: 2014-11-19 | Discharge: 2014-11-21 | Disposition: A | Discharge status: Home / Self Care | Payer: Medicare Other | Attending: Emergency Medicine | Admitting: Emergency Medicine

## 2014-11-19 ENCOUNTER — Encounter (HOSPITAL_COMMUNITY): Payer: Self-pay | Admitting: *Deleted

## 2014-11-19 DIAGNOSIS — Z992 Dependence on renal dialysis: Secondary | ICD-10-CM | POA: Insufficient documentation

## 2014-11-19 DIAGNOSIS — I120 Hypertensive chronic kidney disease with stage 5 chronic kidney disease or end stage renal disease: Secondary | ICD-10-CM | POA: Diagnosis not present

## 2014-11-19 DIAGNOSIS — N186 End stage renal disease: Secondary | ICD-10-CM | POA: Diagnosis not present

## 2014-11-19 DIAGNOSIS — F250 Schizoaffective disorder, bipolar type: Secondary | ICD-10-CM

## 2014-11-19 DIAGNOSIS — Z91148 Patient's other noncompliance with medication regimen for other reason: Secondary | ICD-10-CM

## 2014-11-19 DIAGNOSIS — F29 Unspecified psychosis not due to a substance or known physiological condition: Secondary | ICD-10-CM | POA: Diagnosis not present

## 2014-11-19 DIAGNOSIS — Z9114 Patient's other noncompliance with medication regimen: Secondary | ICD-10-CM

## 2014-11-19 LAB — COMPREHENSIVE METABOLIC PANEL
ALK PHOS: 64 U/L (ref 38–126)
ALT: 15 U/L (ref 14–54)
ANION GAP: 14 (ref 5–15)
AST: 16 U/L (ref 15–41)
Albumin: 3.4 g/dL — ABNORMAL LOW (ref 3.5–5.0)
BUN: 68 mg/dL — ABNORMAL HIGH (ref 6–20)
CALCIUM: 9.8 mg/dL (ref 8.9–10.3)
CO2: 26 mmol/L (ref 22–32)
CREATININE: 12.42 mg/dL — AB (ref 0.44–1.00)
Chloride: 101 mmol/L (ref 101–111)
GFR calc Af Amer: 4 mL/min — ABNORMAL LOW (ref >60)
GFR calc non Af Amer: 3 mL/min — ABNORMAL LOW (ref >60)
Glucose, Bld: 93 mg/dL (ref 65–99)
POTASSIUM: 4.6 mmol/L (ref 3.5–5.1)
SODIUM: 141 mmol/L (ref 135–145)
Total Bilirubin: 0.7 mg/dL (ref 0.3–1.2)
Total Protein: 8 g/dL (ref 6.5–8.1)

## 2014-11-19 LAB — ETHANOL: Alcohol, Ethyl (B): <5 mg/dL (ref <5)

## 2014-11-19 LAB — CBC
HCT: 31.7 % — ABNORMAL LOW (ref 36.0–46.0)
HEMOGLOBIN: 9.6 g/dL — AB (ref 12.0–15.0)
MCH: 27 pg (ref 26.0–34.0)
MCHC: 30.3 g/dL (ref 30.0–36.0)
MCV: 89.3 fL (ref 78.0–100.0)
PLATELETS: 195 10*3/uL (ref 150–400)
RBC: 3.55 MIL/uL — ABNORMAL LOW (ref 3.87–5.11)
RDW: 17.9 % — AB (ref 11.5–15.5)
WBC: 4 10*3/uL (ref 4.0–10.5)

## 2014-11-19 LAB — SALICYLATE LEVEL

## 2014-11-19 MED ORDER — ZOLPIDEM TARTRATE 5 MG PO TABS: 5.0000 mg | ORAL_TABLET | Freq: Every evening | ORAL | Status: DC | PRN

## 2014-11-19 MED ORDER — ACETAMINOPHEN 500 MG PO TABS: 500.0000 mg | ORAL_TABLET | Freq: Four times a day (QID) | ORAL | Status: DC | PRN

## 2014-11-19 MED ORDER — METOPROLOL TARTRATE 25 MG PO TABS
25.0000 mg | ORAL_TABLET | Freq: Two times a day (BID) | ORAL | Status: DC
  Administered 2014-11-20 – 2014-11-21 (×3): 25 mg via ORAL
  Filled 2014-11-19 (×5): qty 1

## 2014-11-19 MED ORDER — COLCHICINE 0.6 MG PO TABS
0.6000 mg | ORAL_TABLET | Freq: Once | ORAL | Status: DC
  Filled 2014-11-19: qty 1

## 2014-11-19 MED ORDER — ACETAMINOPHEN 325 MG PO TABS
650.0000 mg | ORAL_TABLET | ORAL | Status: DC | PRN
  Administered 2014-11-20 – 2014-11-21 (×3): 650 mg via ORAL
  Filled 2014-11-19 (×2): qty 2

## 2014-11-19 MED ORDER — ONDANSETRON HCL 4 MG PO TABS: 4.0000 mg | ORAL_TABLET | Freq: Three times a day (TID) | ORAL | Status: DC | PRN

## 2014-11-19 MED ORDER — CARVEDILOL 6.25 MG PO TABS
6.2500 mg | ORAL_TABLET | Freq: Two times a day (BID) | ORAL | Status: DC
  Administered 2014-11-20 (×2): 6.25 mg via ORAL
  Filled 2014-11-19 (×7): qty 1

## 2014-11-19 MED ORDER — NICOTINE 21 MG/24HR TD PT24
21.0000 mg | MEDICATED_PATCH | Freq: Every day | TRANSDERMAL | Status: DC
  Filled 2014-11-19: qty 1

## 2014-11-19 MED ORDER — IBUPROFEN 400 MG PO TABS: 600.0000 mg | ORAL_TABLET | Freq: Three times a day (TID) | ORAL | Status: DC | PRN

## 2014-11-19 MED ORDER — MIDAZOLAM HCL 2 MG/2ML IJ SOLN: 3.0000 mg | Freq: Two times a day (BID) | INTRAMUSCULAR | Status: DC | PRN

## 2014-11-19 MED ORDER — HALOPERIDOL 5 MG PO TABS
5.0000 mg | ORAL_TABLET | Freq: Two times a day (BID) | ORAL | Status: DC
Start: 2014-11-19 — End: 2014-11-21
  Administered 2014-11-20 – 2014-11-21 (×2): 5 mg via ORAL
  Filled 2014-11-19 (×4): qty 1

## 2014-11-19 MED ORDER — ALUM & MAG HYDROXIDE-SIMETH 200-200-20 MG/5ML PO SUSP
30.0000 mL | ORAL | Status: DC | PRN
Start: 2014-11-19 — End: 2014-11-21

## 2014-11-19 MED ORDER — HYDRALAZINE HCL 20 MG/ML IJ SOLN: 25.0000 mg | Freq: Three times a day (TID) | INTRAMUSCULAR | Status: DC

## 2014-11-19 MED ORDER — CALCIUM ACETATE (PHOS BINDER) 667 MG PO CAPS
667.0000 mg | ORAL_CAPSULE | Freq: Three times a day (TID) | ORAL | Status: DC
  Administered 2014-11-20: 667 mg via ORAL
  Filled 2014-11-19 (×8): qty 1

## 2014-11-19 MED ORDER — HALOPERIDOL LACTATE 5 MG/ML IJ SOLN
10.0000 mg | Freq: Once | INTRAMUSCULAR | Status: AC
  Administered 2014-11-19: 10 mg via INTRAMUSCULAR
  Filled 2014-11-19: qty 2

## 2014-11-19 MED ORDER — IBUPROFEN 200 MG PO TABS: 400.0000 mg | ORAL_TABLET | Freq: Four times a day (QID) | ORAL | Status: DC | PRN

## 2014-11-19 MED ORDER — ISOSORBIDE MONONITRATE ER 30 MG PO TB24
30.0000 mg | ORAL_TABLET | Freq: Every day | ORAL | Status: DC
  Administered 2014-11-20 – 2014-11-21 (×2): 30 mg via ORAL
  Filled 2014-11-19 (×3): qty 1

## 2014-11-19 MED ORDER — RENA-VITE PO TABS
1.0000 | ORAL_TABLET | Freq: Every day | ORAL | Status: DC
  Filled 2014-11-19 (×4): qty 1

## 2014-11-19 MED ORDER — MIDAZOLAM HCL 2 MG/2ML IJ SOLN
5.0000 mg | Freq: Once | INTRAMUSCULAR | Status: DC
  Filled 2014-11-19: qty 6

## 2014-11-19 NOTE — ED Notes (Signed)
Bed: WA31 Expected date:  Expected time:  Means of arrival:  Comments: 

## 2014-11-19 NOTE — ED Notes (Signed)
Pt not able to speak with TTS at this time as she says "I think the injection is starting to work." RN to put in TTS consult after pt awakes. Sitter has pt within sight.

## 2014-11-19 NOTE — ED Notes (Signed)
Pt getting more agitated and adamantly refusing medications. Dr. Lacinda Axon notified. Charge nurse notified. Psych ED refusing pt until BP comes down.
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
## 2014-11-19 NOTE — ED Notes (Signed)
Main lab at bedside 

## 2014-11-19 NOTE — ED Notes (Signed)
Pt's belongings (clothes, shoes, purse) locked in locker # 31.

## 2014-11-19 NOTE — ED Notes (Signed)
Phlebotomy called to draw labs.

## 2014-11-19 NOTE — ED Notes (Addendum)
2x unsuccessful lab draws left hand and [facility] Patient states they are on dialysis and cannot urinate.
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
## 2014-11-19 NOTE — ED Notes (Signed)
Pt BIB by [employer] under IVC paperwork stating "The respondent has been diagnosed with psychosis. She is on medication that requires injections and oral medication. She is not taking the oral medication. The resondent hears voices and talks to them. As her condition escalates she verbally atackes people and she can become physically aggressive. She is a danger to herself and others. She is argumentative to anyone". Pt is arguing loudly with police officers, requesting to be told why was she in custody, states "I was coming from home church after I dropped my friend off, and I was not soliciting or anything, and they arrested me." Pt has visible dialysis grafts/fistulas on her right upper arm.

## 2014-11-19 NOTE — [facility] Counselor (Signed)
Disposition: [name], NP recommends inpatient psychiatric treatment. [facility] at capacity. TTS will contact facilities for placement.

## 2014-11-19 NOTE — ED Notes (Addendum)
Pt did take shot but only after telling her that it was not an option at this point, after tirelessly trying to get pt to take regular medications. Pt is now tearful, sitting on bed. Sitter has patient within sight and patient was provided with a paperback bible per request.

## 2014-11-19 NOTE — ED Notes (Signed)
Pt is awake and alert, requesting to speak with TTS. Pt is refusing all medication. States " blood pressure is always elevated when people are messing with me." Will continue to monitor for safety.

## 2014-11-20 ENCOUNTER — Encounter: Payer: Self-pay | Admitting: Vascular Surgery

## 2014-11-20 ENCOUNTER — Ambulatory Visit: Payer: Medicare Other | Admitting: Family Medicine

## 2014-11-20 ENCOUNTER — Telehealth: Payer: Self-pay | Admitting: Vascular Surgery

## 2014-11-20 DIAGNOSIS — F29 Unspecified psychosis not due to a substance or known physiological condition: Secondary | ICD-10-CM | POA: Diagnosis not present

## 2014-11-20 DIAGNOSIS — F250 Schizoaffective disorder, bipolar type: Secondary | ICD-10-CM | POA: Diagnosis not present

## 2014-11-20 HISTORY — DX: Schizoaffective disorder, bipolar type: F25.0

## 2014-11-20 LAB — RAPID URINE DRUG SCREEN, HOSP PERFORMED
Amphetamines: NOT DETECTED
BARBITURATES: NOT DETECTED
Benzodiazepines: NOT DETECTED
COCAINE: NOT DETECTED
Opiates: NOT DETECTED
TETRAHYDROCANNABINOL: NOT DETECTED

## 2014-11-20 MED ORDER — BENZTROPINE MESYLATE 1 MG PO TABS
0.5000 mg | ORAL_TABLET | Freq: Two times a day (BID) | ORAL | Status: DC
  Administered 2014-11-20: 0.5 mg via ORAL
  Filled 2014-11-20 (×2): qty 1

## 2014-11-20 MED ORDER — TRAZODONE HCL 50 MG PO TABS
50.0000 mg | ORAL_TABLET | Freq: Every day | ORAL | Status: DC
  Administered 2014-11-20: 50 mg via ORAL
  Filled 2014-11-20: qty 1

## 2014-11-20 MED ORDER — HYDRALAZINE HCL 25 MG PO TABS
25.0000 mg | ORAL_TABLET | Freq: Three times a day (TID) | ORAL | Status: DC
  Administered 2014-11-20 – 2014-11-21 (×3): 25 mg via ORAL
  Filled 2014-11-20 (×6): qty 1

## 2014-11-20 NOTE — ED Provider Notes (Signed)
Pt here for psychiatric reasons. Also ESRD and typically goes to dialysis unit at [facility]. Last dialysis on 8/13.Nursing note stating only 1.5 hours then. Psychiatry recommending inpatient admission. No beds at [facility] and working on placement which may be prolonged with underlying medical issues. I feel would be more appropriate to be at [facility] as she may potentially be in ED for an extended period of time. Discussed with Dr Posey Pronto, nephrology. Discussed with Dr Venora Maples. Will transfer to [facility] ED.   Virgel Manifold, MD 11/20/14 [phone]

## 2014-11-20 NOTE — Care Management (Signed)
ED CM met with patient who was requesting to see CM regarding referral for Dermatology.  Dermatology referral placed patient made aware, provided patient with follow up information patient verbalized appreciation. Patient also made aware of appt scheduled for 8/16 with Dr. Theresia Majors. Patient states, she does not think she will be discharged in time to keep appointment. Requesting that I notify the office that she is in the hospital. In basket message sent to office. No further CM need identified

## 2014-11-20 NOTE — ED Notes (Signed)
Spoke with dialysis, will either get dialysis today or tomorrow.

## 2014-11-20 NOTE — Progress Notes (Signed)
Patient arrived to [facility] ED. Patient familiar with this SW and asking to charge her cell phone so she can get on facebook. Patient acting appropriately on unit, asking for lunch. She reports being angry with her mother because she took papers out on her and she is a Media planner".  Will have patient reassessed on 8/15 and discussed in LOS meeting at 9:30am.  Lane Hacker, MSW Clinical Social Work: Emergency Room [phone]
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
## 2014-11-20 NOTE — ED Notes (Signed)
Pt requesting pain medication for her back hurting.

## 2014-11-20 NOTE — Telephone Encounter (Signed)
l/v/m for patient notifying her of her appt. tomorrow at 3:45 with dr. Kellie Simmering   bar

## 2014-11-20 NOTE — Progress Notes (Addendum)
Patient was referred to: [facility] - left voicemail. [facility] - per intake, fax it. Referral re-faxed.  Declined at: [facility] - per Ron, due to medical  At capacity: [facility] will continue to seek placement.  Verlon Setting, [facility] Disposition staff 11/20/2014 7:31 PM

## 2014-11-20 NOTE — [facility] Counselor (Signed)
Repeat consult was called in at 22:55 on 8/14. Pt was already assessed by TTS around 21:00 on 8/14.  Disposition: [name], NP recommends inpatient psychiatric treatment. No appropriate beds at [facility].  TTS will contact other facilities for placement in the meantime.  Counselor informed Dr. Dayna Barker of disposition. He will inform Dr Tomi Bamberger.   Ramond Dial, [facility]

## 2014-11-21 ENCOUNTER — Encounter: Payer: Self-pay | Admitting: Vascular Surgery

## 2014-11-21 ENCOUNTER — Ambulatory Visit: Payer: Self-pay | Admitting: Vascular Surgery

## 2014-11-21 ENCOUNTER — Ambulatory Visit (INDEPENDENT_AMBULATORY_CARE_PROVIDER_SITE_OTHER): Payer: Medicare Other | Admitting: Vascular Surgery

## 2014-11-21 ENCOUNTER — Ambulatory Visit: Payer: Self-pay | Admitting: Family Medicine

## 2014-11-21 VITALS — BP 139/88 | HR 97 | Ht 68.0 in | Wt 142.0 lb

## 2014-11-21 DIAGNOSIS — Z91148 Patient's other noncompliance with medication regimen for other reason: Secondary | ICD-10-CM

## 2014-11-21 DIAGNOSIS — Z9114 Patient's other noncompliance with medication regimen: Secondary | ICD-10-CM

## 2014-11-21 DIAGNOSIS — N186 End stage renal disease: Secondary | ICD-10-CM | POA: Diagnosis not present

## 2014-11-21 DIAGNOSIS — F250 Schizoaffective disorder, bipolar type: Secondary | ICD-10-CM | POA: Diagnosis not present

## 2014-11-21 DIAGNOSIS — F29 Unspecified psychosis not due to a substance or known physiological condition: Secondary | ICD-10-CM | POA: Diagnosis not present

## 2014-11-21 LAB — RENAL FUNCTION PANEL
ALBUMIN: 2.5 g/dL — AB (ref 3.5–5.0)
ANION GAP: 16 — AB (ref 5–15)
BUN: 81 mg/dL — ABNORMAL HIGH (ref 6–20)
CO2: 21 mmol/L — ABNORMAL LOW (ref 22–32)
Calcium: 8.6 mg/dL — ABNORMAL LOW (ref 8.9–10.3)
Chloride: 97 mmol/L — ABNORMAL LOW (ref 101–111)
Creatinine, Ser: 14.99 mg/dL — ABNORMAL HIGH (ref 0.44–1.00)
GFR calc non Af Amer: 3 mL/min — ABNORMAL LOW (ref >60)
GFR, EST AFRICAN AMERICAN: 3 mL/min — AB (ref >60)
GLUCOSE: 141 mg/dL — AB (ref 65–99)
PHOSPHORUS: 10.6 mg/dL — AB (ref 2.5–4.6)
POTASSIUM: 4.7 mmol/L (ref 3.5–5.1)
Sodium: 134 mmol/L — ABNORMAL LOW (ref 135–145)

## 2014-11-21 LAB — CBC
HEMATOCRIT: 28.4 % — AB (ref 36.0–46.0)
HEMOGLOBIN: 9 g/dL — AB (ref 12.0–15.0)
MCH: 27.4 pg (ref 26.0–34.0)
MCHC: 31.7 g/dL (ref 30.0–36.0)
MCV: 86.6 fL (ref 78.0–100.0)
Platelets: 177 10*3/uL (ref 150–400)
RBC: 3.28 MIL/uL — ABNORMAL LOW (ref 3.87–5.11)
RDW: 17.7 % — ABNORMAL HIGH (ref 11.5–15.5)
WBC: 3.8 10*3/uL — ABNORMAL LOW (ref 4.0–10.5)

## 2014-11-21 MED ORDER — DIPHENHYDRAMINE HCL 50 MG/ML IJ SOLN
INTRAMUSCULAR | Status: AC
  Filled 2014-11-21: qty 1

## 2014-11-21 MED ORDER — LIDOCAINE-PRILOCAINE 2.5-2.5 % EX CREA
1.0000 "application " | TOPICAL_CREAM | CUTANEOUS | Status: DC | PRN
  Filled 2014-11-21: qty 5

## 2014-11-21 MED ORDER — HEPARIN SODIUM (PORCINE) 1000 UNIT/ML DIALYSIS
1000.0000 [IU] | INTRAMUSCULAR | Status: DC | PRN
  Filled 2014-11-21: qty 1

## 2014-11-21 MED ORDER — ACETAMINOPHEN 325 MG PO TABS
ORAL_TABLET | ORAL | Status: AC
  Administered 2014-11-21: 650 mg via ORAL
  Filled 2014-11-21: qty 2

## 2014-11-21 MED ORDER — LIDOCAINE HCL (PF) 1 % IJ SOLN: 5.0000 mL | INTRAMUSCULAR | Status: DC | PRN

## 2014-11-21 MED ORDER — SODIUM CHLORIDE 0.9 % IV SOLN: 100.0000 mL | INTRAVENOUS | Status: DC | PRN

## 2014-11-21 MED ORDER — ALTEPLASE 2 MG IJ SOLR
2.0000 mg | Freq: Once | INTRAMUSCULAR | Status: DC | PRN
  Filled 2014-11-21: qty 2

## 2014-11-21 MED ORDER — PENTAFLUOROPROP-TETRAFLUOROETH EX AERO
1.0000 "application " | INHALATION_SPRAY | CUTANEOUS | Status: DC | PRN
  Filled 2014-11-21: qty 30

## 2014-11-21 MED ORDER — NEPRO/CARBSTEADY PO LIQD
237.0000 mL | ORAL | Status: DC | PRN
  Filled 2014-11-21: qty 237

## 2014-11-21 MED ORDER — DIPHENHYDRAMINE HCL 50 MG/ML IJ SOLN
25.0000 mg | Freq: Once | INTRAMUSCULAR | Status: AC
  Administered 2014-11-21: 25 mg via INTRAVENOUS

## 2014-11-21 NOTE — Progress Notes (Signed)
Subjective:     Patient ID: Sara [name], female   DOB: [birth year], 34 y.o.   MRN: [id number]  HPI this 34 year old female with end-stage renal disease was referred for evaluation of her right upper arm brachial to cephalic AV fistula which has been present for over one year. The fistula has dilated from the distal upper arm into the mid upper arm over the past several months. She has had no ulceration or drainage. She has had no spontaneous bleeding from the fistula. She had dialysis earlier today with good flows and no problems. She denies pain or numbness in the right hand.  Past Medical History  Diagnosis Date  . Lupus     "kidney lupus"  . History of blood transfusion     "this is probably my 3rd" (10/09/2014)  . ESRD (end stage renal disease) on dialysis     "[name]; [facility]" (10/09/2014)  . Bipolar disorder     [name] 10/09/2014  . Schizophrenia     [name] 10/09/2014  . Chronic anemia     [name] 10/09/2014  . CHF (congestive heart failure)     systolic/notes 09/08/1495  . Acute myopericarditis     hx/notes 10/09/2014  . Psychosis   . Hypertension   . Lupus   . Lupus nephritis   . Bipolar 1 disorder   . Schizophrenia   . Pregnancy   . ESRD (end stage renal disease)   . Anemia   . Low back pain     Social History  Substance Use Topics  . Smoking status: Current Every Day Smoker -- 1.00 packs/day for 2 years    Types: Cigarettes  . Smokeless tobacco: Current User  . Alcohol Use: 4.2 oz/week    4 Cans of beer, 3 Shots of liquor per week     Comment: WEEKENDS- 02/21/14-denies that she has not used any etoh or drugs in over a year.    Family History  Problem Relation Age of Onset  . Drug abuse Father   . Kidney disease Father     Allergies  Allergen Reactions  . Ativan [Lorazepam] Other (See Comments)    Dysarthria(patient has difficulty speaking and slurred speech); denies swelling, itching, pain, or numbness.  [name] [Ziprasidone Hcl] Itching and Swelling    Tongue  swelling  . Keflex [Cephalexin] Swelling and Other (See Comments)    Tongue swelling. Can't talk   . Other Itching    wool     Current outpatient prescriptions:  .  acetaminophen (TYLENOL) 500 MG tablet, Take 500 mg by mouth every 6 (six) hours as needed for mild pain., Disp: , Rfl:  .  calcium acetate (PHOSLO) 667 MG capsule, Take 667 mg by mouth 3 (three) times daily with meals., Disp: , Rfl:  .  colchicine 0.6 MG tablet, Take 0.6 mg by mouth every other day., Disp: , Rfl:  .  hydrALAZINE (APRESOLINE) 25 MG tablet, Take 25 mg by mouth 3 (three) times daily., Disp: , Rfl:  .  ibuprofen (ADVIL,MOTRIN) 200 MG tablet, Take 400 mg by mouth every 6 (six) hours as needed for moderate pain., Disp: , Rfl:  .  isosorbide mononitrate (IMDUR) 30 MG 24 hr tablet, Take 30 mg by mouth daily., Disp: , Rfl: 0 .  metoprolol tartrate (LOPRESSOR) 25 MG tablet, Take 25 mg by mouth 2 (two) times daily., Disp: , Rfl:  .  multivitamin (RENA-VIT) TABS tablet, Take 1 tablet by mouth at bedtime., Disp: , Rfl: 11 .  carvedilol (  COREG) 6.25 MG tablet, Take 1 tablet (6.25 mg total) by mouth 2 (two) times daily with a meal. (Patient not taking: Reported on 11/19/2014), Disp: 60 tablet, Rfl: 1 .  haloperidol (HALDOL) 5 MG tablet, Take 1 tablet (5 mg total) by mouth 2 (two) times daily. (Patient not taking: Reported on 11/19/2014), Disp: 60 tablet, Rfl: 0  Filed Vitals:   11/21/14 1542  BP: 139/88  Pulse: 97  Height: 5\' 8"  (1.727 m)  Weight: 142 lb (64.411 kg)  SpO2: 100%    Body mass index is 21.6 kg/(m^2).           Review of Systems denies chest pain, dyspnea on exertion, PND, orthopnea, hemoptysis. Does have occasional wheezing and rashes.     Objective:   Physical Exam BP 139/88 mmHg  Pulse 97  Ht 5\' 8"  (1.727 m)  Wt 142 lb (64.411 kg)  BMI 21.60 kg/m2  SpO2 100%  Gen. well-developed thin female no apparent distress alert and oriented 3 Lungs no rhonchi or wheezing Cardiovascular regular  rhythm no murmurs Right upper extremity with brachial-cephalic AV fistula. It is diffusely dilated up to 3 cm maximum diameter in the distal upper arm. There is 1 relatively thin area about 1 cm in diameter extending laterally but no ulceration or drainage is noted. 2+ radial pulses palpable distally but no evidence of ischemia.     Assessment:     Diffuse dilatation of right brachial-cephalic AV fistula in patient who has been on hemodialysis over one year. This has been her only access. Not causing pain and has had no infection or drainage or spontaneous bleeding.    Plan:     Discussed the situation with patient and she would like to keep the fistula as is and keep a close watch on whether it continues to dilate. If she has any episodes of spontaneous bleeding or ulceration with drainage she will be in touch with [place]. Otherwise return on when necessary basis

## 2014-11-21 NOTE — ED Notes (Signed)
Pt returned from dialysis. Pt requesting lunch tray and also requesting to go home. Pt informed that Phych has to see and re-evaluate.

## 2014-11-21 NOTE — ED Notes (Signed)
Pt very argumentative when giving medications. Pt refusing to take many of the medications,

## 2014-11-21 NOTE — Discharge Instructions (Signed)
Chronic Kidney Disease °Chronic kidney disease occurs when the kidneys are damaged over a long period. The kidneys are two organs that lie on either side of the spine between the middle of the back and the front of the abdomen. The kidneys:  °· Remove wastes and extra water from the blood.   °· Produce important hormones. These help keep bones strong, regulate blood pressure, and help create red blood cells.   °· Balance the fluids and chemicals in the blood and tissues. °A small amount of kidney damage may not cause problems, but a large amount of damage may make it difficult or impossible for the kidneys to work the way they should. If steps are not taken to slow down the kidney damage or stop it from getting worse, the kidneys may stop working permanently. Most of the time, chronic kidney disease does not go away. However, it can often be controlled, and those with the disease can usually live normal lives. °CAUSES  °The most common causes of chronic kidney disease are diabetes and high blood pressure (hypertension). Chronic kidney disease may also be caused by:  °· Diseases that cause the kidneys' filters to become inflamed.   °· Diseases that affect the immune system.   °· Genetic diseases.   °· Medicines that damage the kidneys, such as anti-inflammatory medicines.   °· Poisoning or exposure to toxic substances.   °· A reoccurring kidney or urinary infection.   °· A problem with urine flow. This may be caused by:   °¨ Cancer.   °¨ Kidney stones.   °¨ An enlarged prostate in males. °SIGNS AND SYMPTOMS  °Because the kidney damage in chronic kidney disease occurs slowly, symptoms develop slowly and may not be obvious until the kidney damage becomes severe. A person may have a kidney disease for years without showing any symptoms. Symptoms can include:  °· Swelling (edema) of the legs, ankles, or feet.   °· Tiredness (lethargy).   °· Nausea or vomiting.   °· Confusion.   °· Problems with urination, such as:    °¨ Decreased urine production.   °¨ Frequent urination, especially at night.   °¨ Frequent accidents in children who are potty trained.   °· Muscle twitches and cramps.   °· Shortness of breath.  °· Weakness.   °· Persistent itchiness.   °· Loss of appetite. °· Metallic taste in the mouth. °· Trouble sleeping. °· Slowed development in children. °· Short stature in children. °DIAGNOSIS  °Chronic kidney disease may be detected and diagnosed by tests, including blood, urine, imaging, or kidney biopsy tests.  °TREATMENT  °Most chronic kidney diseases cannot be cured. Treatment usually involves relieving symptoms and preventing or slowing the progression of the disease. Treatment may include:  °· A special diet. You may need to avoid alcohol and foods that are salty and high in potassium.   °· Medicines. These may:   °¨ Lower blood pressure.   °¨ Relieve anemia.   °¨ Relieve swelling.   °¨ Protect the bones. °HOME CARE INSTRUCTIONS  °· Follow your prescribed diet.   °· Take medicines only as directed by your health care provider. Do not take any new medicines (prescription, over-the-counter, or nutritional supplements) unless approved by your health care provider. Many medicines can worsen your kidney damage or need to have the dose adjusted.   °· Quit smoking if you smoke. Talk to your health care provider about a smoking cessation program.   °· Keep all follow-up visits as directed by your health care provider. °SEEK IMMEDIATE MEDICAL CARE IF: °· Your symptoms get worse or you develop new symptoms.   °· You develop symptoms of end-stage kidney disease. These   include:   °¨ Headaches.   °¨ Abnormally dark or light skin.   °¨ Numbness in the hands or feet.   °¨ Easy bruising.   °¨ Frequent hiccups.   °¨ Menstruation stops.   °· You have a fever.   °· You have decreased urine production.   °· You have pain or bleeding when urinating. °MAKE SURE YOU: °· Understand these instructions. °· Will watch your condition. °· Will  get help right away if you are not doing well or get worse. °FOR MORE INFORMATION  °· American Association of Kidney Patients: www.aakp.org °· National Kid[employer] Foundation: www.kidney.org °· [employer]: www.akfinc.org °· [employer] [facility]: www.lifeoptions.org and www.kidneyschool.org °Document Released: 01/01/2008 Document Revised: 08/08/2013 Document Reviewed: 11/21/2011 °[employer]® Patient Information ©2015 [employer]. This information is not intended to replace advice given to you by your health care provider. Make sure you discuss any questions you have with your health care provider. ° °

## 2014-11-21 NOTE — Progress Notes (Signed)
Hemodialysis= System clotted with 1 hour 23 minutes remaining. Pt would not allow staff to reset machine. Signed off AMA. UF=2.5L d/t early ending. All blood able to be rinsed back and system flushed throughout procedure. Report given to RN in ED.

## 2014-11-21 NOTE — ED Provider Notes (Signed)
Patient evaluated by the psychiatrist on-call. Found to not be suicidal or a threat to themselves. Recommend being discharged home. IVC withdrawn.  Deno Etienne, DO 11/21/14 1453

## 2014-11-21 NOTE — ED Notes (Signed)
Pt asking about ETA for Dr Lenna Sciara. [place] contacted- stated that he had another consult at [facility] then he is coming here to see pt. Pt updated.

## 2014-11-21 NOTE — ED Notes (Signed)
Pt verbally aggressive in hallway. Asked to return to room so that this RN can talk to her.Pt stating that she needs to be discharged so that she can go to her appointments that were scheduled for her by SW. Pt reminded that she is waiting for psych to see her in person and that she is IVC so she cannot leave. Pt cursing and demanding ice and snack. Pt reminded of snack time and Pod C rules.

## 2014-11-21 NOTE — ED Notes (Signed)
Patient transported to dialysis with sitter. 
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
## 2014-11-21 NOTE — ED Notes (Signed)
Psychiatrist in to see patient, states is releasing patient of IVC status and patient is able to be d/c'd to home pending EDP approval.  Dr. Tyrone Nine made aware.

## 2014-11-21 NOTE — ED Notes (Signed)
Meal tray delivered.

## 2014-11-21 NOTE — ED Notes (Signed)
Pt knocking on door to SW office. Pt instructed that this was inappropriate behavior and that SW will be in to talk with her soon.

## 2014-11-21 NOTE — ED Notes (Signed)
Pt up to restroom.

## 2014-11-21 NOTE — Procedures (Signed)
Patient seen on Hemodialysis---being held over in the ER as she awaits in-patient [facility] admission for acute psychosis. She will need transportation to [facility] in-patient HD unit 3 days a week (on Tuesday, Thursday and Saturday) while admitted to [facility]. QB 400, UF goal 4.5L Treatment adjusted as needed.  Elmarie Shiley MD [facility]. Office # [phone] Pager # [phone] 9:22 AM

## 2014-11-22 ENCOUNTER — Ambulatory Visit: Payer: Self-pay | Admitting: Obstetrics and Gynecology

## 2014-11-23 ENCOUNTER — Emergency Department (HOSPITAL_COMMUNITY)
Admission: EM | Admit: 2014-11-23 | Discharge: 2014-11-23 | Disposition: A | Discharge status: Home / Self Care | Payer: Medicare Other | Attending: Nephrology | Admitting: Nephrology

## 2014-11-23 ENCOUNTER — Encounter (HOSPITAL_COMMUNITY): Payer: Self-pay

## 2014-11-23 DIAGNOSIS — N186 End stage renal disease: Secondary | ICD-10-CM | POA: Diagnosis not present

## 2014-11-23 DIAGNOSIS — M25512 Pain in left shoulder: Secondary | ICD-10-CM | POA: Insufficient documentation

## 2014-11-23 DIAGNOSIS — G8929 Other chronic pain: Secondary | ICD-10-CM | POA: Diagnosis not present

## 2014-11-23 DIAGNOSIS — F209 Schizophrenia, unspecified: Secondary | ICD-10-CM | POA: Insufficient documentation

## 2014-11-23 DIAGNOSIS — Z992 Dependence on renal dialysis: Secondary | ICD-10-CM | POA: Diagnosis not present

## 2014-11-23 DIAGNOSIS — F17210 Nicotine dependence, cigarettes, uncomplicated: Secondary | ICD-10-CM | POA: Insufficient documentation

## 2014-11-23 DIAGNOSIS — R21 Rash and other nonspecific skin eruption: Secondary | ICD-10-CM | POA: Diagnosis not present

## 2014-11-23 DIAGNOSIS — I509 Heart failure, unspecified: Secondary | ICD-10-CM | POA: Insufficient documentation

## 2014-11-23 DIAGNOSIS — F319 Bipolar disorder, unspecified: Secondary | ICD-10-CM | POA: Insufficient documentation

## 2014-11-23 DIAGNOSIS — L299 Pruritus, unspecified: Secondary | ICD-10-CM | POA: Diagnosis present

## 2014-11-23 DIAGNOSIS — I120 Hypertensive chronic kidney disease with stage 5 chronic kidney disease or end stage renal disease: Secondary | ICD-10-CM | POA: Insufficient documentation

## 2014-11-23 LAB — CBC
HEMATOCRIT: 27.8 % — AB (ref 36.0–46.0)
HEMOGLOBIN: 9.1 g/dL — AB (ref 12.0–15.0)
MCH: 28.2 pg (ref 26.0–34.0)
MCHC: 32.7 g/dL (ref 30.0–36.0)
MCV: 86.1 fL (ref 78.0–100.0)
Platelets: 143 10*3/uL — ABNORMAL LOW (ref 150–400)
RBC: 3.23 MIL/uL — ABNORMAL LOW (ref 3.87–5.11)
RDW: 17.3 % — ABNORMAL HIGH (ref 11.5–15.5)
WBC: 4.2 10*3/uL (ref 4.0–10.5)

## 2014-11-23 LAB — RENAL FUNCTION PANEL
Albumin: 2.7 g/dL — ABNORMAL LOW (ref 3.5–5.0)
Anion gap: 14 (ref 5–15)
BUN: 62 mg/dL — ABNORMAL HIGH (ref 6–20)
CO2: 23 mmol/L (ref 22–32)
Calcium: 9.2 mg/dL (ref 8.9–10.3)
Chloride: 99 mmol/L — ABNORMAL LOW (ref 101–111)
Creatinine, Ser: 12.79 mg/dL — ABNORMAL HIGH (ref 0.44–1.00)
GFR calc Af Amer: 4 mL/min — ABNORMAL LOW
GFR calc non Af Amer: 3 mL/min — ABNORMAL LOW
Glucose, Bld: 92 mg/dL (ref 65–99)
Phosphorus: 9.6 mg/dL — ABNORMAL HIGH (ref 2.5–4.6)
Potassium: 4.8 mmol/L (ref 3.5–5.1)
Sodium: 136 mmol/L (ref 135–145)

## 2014-11-23 MED ORDER — DIPHENHYDRAMINE HCL 25 MG PO CAPS
50.0000 mg | ORAL_CAPSULE | Freq: Once | ORAL | Status: AC
  Administered 2014-11-23: 50 mg via ORAL

## 2014-11-23 MED ORDER — HEPARIN SODIUM (PORCINE) 1000 UNIT/ML DIALYSIS
40.0000 [IU]/kg | INTRAMUSCULAR | Status: DC | PRN
  Filled 2014-11-23: qty 3

## 2014-11-23 MED ORDER — ACETAMINOPHEN 500 MG PO TABS
500.0000 mg | ORAL_TABLET | Freq: Once | ORAL | Status: DC
  Filled 2014-11-23: qty 1

## 2014-11-23 MED ORDER — DIPHENHYDRAMINE HCL 25 MG PO CAPS
25.0000 mg | ORAL_CAPSULE | Freq: Once | ORAL | Status: DC
  Filled 2014-11-23: qty 1

## 2014-11-23 MED ORDER — ALTEPLASE 2 MG IJ SOLR
2.0000 mg | Freq: Once | INTRAMUSCULAR | Status: DC | PRN
  Filled 2014-11-23: qty 2

## 2014-11-23 MED ORDER — HEPARIN SODIUM (PORCINE) 1000 UNIT/ML DIALYSIS
1000.0000 [IU] | INTRAMUSCULAR | Status: DC | PRN
  Filled 2014-11-23: qty 1

## 2014-11-23 MED ORDER — LIDOCAINE-PRILOCAINE 2.5-2.5 % EX CREA
1.0000 "application " | TOPICAL_CREAM | CUTANEOUS | Status: DC | PRN
  Filled 2014-11-23: qty 5

## 2014-11-23 MED ORDER — SODIUM CHLORIDE 0.9 % IV SOLN: 100.0000 mL | INTRAVENOUS | Status: DC | PRN

## 2014-11-23 MED ORDER — NEPRO/CARBSTEADY PO LIQD
237.0000 mL | ORAL | Status: DC | PRN
  Filled 2014-11-23: qty 237

## 2014-11-23 MED ORDER — LIDOCAINE HCL (PF) 1 % IJ SOLN: 5.0000 mL | INTRAMUSCULAR | Status: DC | PRN

## 2014-11-23 MED ORDER — PENTAFLUOROPROP-TETRAFLUOROETH EX AERO
1.0000 "application " | INHALATION_SPRAY | CUTANEOUS | Status: DC | PRN
  Filled 2014-11-23: qty 30

## 2014-11-23 MED ORDER — ACETAMINOPHEN 325 MG PO TABS
650.0000 mg | ORAL_TABLET | Freq: Once | ORAL | Status: AC
  Administered 2014-11-23: 650 mg via ORAL
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
## 2014-11-23 NOTE — Progress Notes (Signed)
Pt called the HD unit several times insisting she needed dialysis and wanted to know the time she wd be dialyzing; the nephrologist ordered her to be dialyzed. Pt was rude upon arriving to the unit; stating I needed to learn how to cannulate pt correctly and I needed  retraining. After I started the tx she asked for ice; I got the ice and started cleaning the table so I could put the ice within her reach; pt stated I was too slow in giving her the ice and I needed to do my job and give her the ice. She uttered some obscenities and I told her I would not tolerate being disrespected. 1 hour into her tx pt requested to be taken off citing pain in her access. I couldn't take her off the machine because I was ending tx with my other pt and had told her she would next. She started screaming that she was hurting and was disruptive on the unit until I was able to end her tx. Removed the needles and pt refused to have the access dressed enough to prevent bleeding. Pt left the unit to go home; she denies SOB; n/v, dizziness, and states has pain in the arm.

## 2014-11-23 NOTE — ED Notes (Signed)
Pt demanding multiple drinks, refusing to wait for nurse to get to her and going to other units to ask for a drink instead of waiting for rn to get it for her.  Pt continues to go off unit, outside of ER and will not listen to staff when telling her the rules of the ER.  Pt very disrespectful and is continuing to press her call light constantly causing RN to spend time on her instead of other pt.

## 2014-11-23 NOTE — Progress Notes (Signed)
Spoke with patient while in the emergency department concerning the time we would be able to get upstairs to the unit for hemodialysis.  Pt became very rude and disrespectful on the phone because we were unable to get her upstairs.  Approximately 1 hour later pt called the unit again inquiring what time we would get her.  Pt was told that we were in the process of getting report and they would be bringing her to the unit within the next few minutes.  Pt replied that she did not want to come now because she had not eaten and was awaiting her food to come.  I told the patient that we could not continue to hold a bay open for her because we had several other patients to dialyze to as well.  Pt became very angry and replied with fuck you [place] and hung the phone.

## 2014-11-23 NOTE — ED Notes (Signed)
Pt continues to be demanding, cussing at RN, and being verbally aggressive towards staff.

## 2014-11-23 NOTE — ED Notes (Signed)
Pt continues to be disrespectful to staff, told [place] at dialysis, "Fuck You" when she didn't get her way.  Pt refusing to go to dialysis until she eats her lunch.  Pt refusing to eat a bag lunch.

## 2014-11-23 NOTE — ED Notes (Signed)
Pt would like dialysis today. C/o sores and itching all over body.

## 2014-11-25 ENCOUNTER — Encounter (HOSPITAL_COMMUNITY): Payer: Self-pay | Admitting: Emergency Medicine

## 2014-11-25 ENCOUNTER — Emergency Department (HOSPITAL_COMMUNITY)
Admission: EM | Admit: 2014-11-25 | Discharge: 2014-11-25 | Disposition: A | Discharge status: Home / Self Care | Payer: Medicare Other | Attending: Emergency Medicine | Admitting: Emergency Medicine

## 2014-11-25 DIAGNOSIS — L509 Urticaria, unspecified: Secondary | ICD-10-CM | POA: Diagnosis not present

## 2014-11-25 DIAGNOSIS — Z862 Personal history of diseases of the blood and blood-forming organs and certain disorders involving the immune mechanism: Secondary | ICD-10-CM | POA: Insufficient documentation

## 2014-11-25 DIAGNOSIS — Z720 Tobacco use: Secondary | ICD-10-CM | POA: Insufficient documentation

## 2014-11-25 DIAGNOSIS — R21 Rash and other nonspecific skin eruption: Secondary | ICD-10-CM | POA: Diagnosis present

## 2014-11-25 DIAGNOSIS — N186 End stage renal disease: Secondary | ICD-10-CM

## 2014-11-25 DIAGNOSIS — F29 Unspecified psychosis not due to a substance or known physiological condition: Secondary | ICD-10-CM | POA: Diagnosis not present

## 2014-11-25 DIAGNOSIS — I120 Hypertensive chronic kidney disease with stage 5 chronic kidney disease or end stage renal disease: Secondary | ICD-10-CM | POA: Insufficient documentation

## 2014-11-25 DIAGNOSIS — I509 Heart failure, unspecified: Secondary | ICD-10-CM | POA: Diagnosis not present

## 2014-11-25 DIAGNOSIS — F17210 Nicotine dependence, cigarettes, uncomplicated: Secondary | ICD-10-CM | POA: Diagnosis not present

## 2014-11-25 DIAGNOSIS — Z992 Dependence on renal dialysis: Secondary | ICD-10-CM | POA: Diagnosis not present

## 2014-11-25 LAB — CBC WITH DIFFERENTIAL/PLATELET
Basophils Absolute: 0 10*3/uL (ref 0.0–0.1)
Basophils Relative: 1 % (ref 0–1)
EOS ABS: 0.1 10*3/uL (ref 0.0–0.7)
Eosinophils Relative: 3 % (ref 0–5)
HCT: 30.6 % — ABNORMAL LOW (ref 36.0–46.0)
HEMOGLOBIN: 9.6 g/dL — AB (ref 12.0–15.0)
LYMPHS ABS: 1.3 10*3/uL (ref 0.7–4.0)
LYMPHS PCT: 33 % (ref 12–46)
MCH: 27.3 pg (ref 26.0–34.0)
MCHC: 31.4 g/dL (ref 30.0–36.0)
MCV: 86.9 fL (ref 78.0–100.0)
MONOS PCT: 8 % (ref 3–12)
Monocytes Absolute: 0.3 10*3/uL (ref 0.1–1.0)
NEUTROS PCT: 55 % (ref 43–77)
Neutro Abs: 2.2 10*3/uL (ref 1.7–7.7)
Platelets: 193 10*3/uL (ref 150–400)
RBC: 3.52 MIL/uL — ABNORMAL LOW (ref 3.87–5.11)
RDW: 17.1 % — ABNORMAL HIGH (ref 11.5–15.5)
WBC: 4 10*3/uL (ref 4.0–10.5)

## 2014-11-25 LAB — I-STAT CHEM 8, ED
BUN: 64 mg/dL — AB (ref 6–20)
CALCIUM ION: 1.16 mmol/L (ref 1.12–1.23)
CREATININE: 12 mg/dL — AB (ref 0.44–1.00)
Chloride: 102 mmol/L (ref 101–111)
GLUCOSE: 102 mg/dL — AB (ref 65–99)
HCT: 33 % — ABNORMAL LOW (ref 36.0–46.0)
Hemoglobin: 11.2 g/dL — ABNORMAL LOW (ref 12.0–15.0)
Potassium: 5.1 mmol/L (ref 3.5–5.1)
SODIUM: 136 mmol/L (ref 135–145)
TCO2: 22 mmol/L (ref 0–100)

## 2014-11-25 MED ORDER — DIPHENHYDRAMINE HCL 25 MG PO CAPS
50.0000 mg | ORAL_CAPSULE | Freq: Once | ORAL | Status: AC
  Administered 2014-11-25: 50 mg via ORAL
  Filled 2014-11-25: qty 2

## 2014-11-25 NOTE — ED Notes (Signed)
Pt left the ED and went outside.

## 2014-11-25 NOTE — ED Notes (Signed)
Here for dialysis. Usual dialysis days are Tuesday, Thursday and Saturday.  Onset one month ago rash entire body itching.

## 2014-11-25 NOTE — ED Notes (Signed)
Called dialysis; she will not be able to get in up there until the afternoon.
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
## 2014-11-25 NOTE — Discharge Instructions (Signed)
Continue benadryl as needed for rash.   See your dermatologist.   See your regular doctor.   Return to ER if you have worse trouble breathing, leg swelling, abdominal pain, chest pain, fevers.

## 2014-11-25 NOTE — ED Notes (Signed)
Dialysis called back and said it would be 3:30. Told Karie, she stated she had to be at work at 2, that she has called dialysis this morning and told them that, and she would call up to dialysis herself now and get this straight.

## 2014-11-25 NOTE — ED Notes (Signed)
Breakfast tray ordered per patient request

## 2014-11-25 NOTE — ED Notes (Signed)
Pt back in department

## 2014-11-27 ENCOUNTER — Non-Acute Institutional Stay (HOSPITAL_COMMUNITY)
Admission: EM | Admit: 2014-11-27 | Discharge: 2014-11-28 | Disposition: A | Discharge status: Home / Self Care | Payer: Medicare Other | Attending: Emergency Medicine | Admitting: Emergency Medicine

## 2014-11-27 ENCOUNTER — Encounter (HOSPITAL_COMMUNITY): Payer: Self-pay | Admitting: Emergency Medicine

## 2014-11-27 DIAGNOSIS — F17210 Nicotine dependence, cigarettes, uncomplicated: Secondary | ICD-10-CM | POA: Diagnosis not present

## 2014-11-27 DIAGNOSIS — F209 Schizophrenia, unspecified: Secondary | ICD-10-CM | POA: Diagnosis not present

## 2014-11-27 DIAGNOSIS — N899 Noninflammatory disorder of vagina, unspecified: Secondary | ICD-10-CM | POA: Diagnosis not present

## 2014-11-27 DIAGNOSIS — D649 Anemia, unspecified: Secondary | ICD-10-CM | POA: Insufficient documentation

## 2014-11-27 DIAGNOSIS — Z79899 Other long term (current) drug therapy: Secondary | ICD-10-CM | POA: Insufficient documentation

## 2014-11-27 DIAGNOSIS — M3214 Glomerular disease in systemic lupus erythematosus: Secondary | ICD-10-CM | POA: Diagnosis not present

## 2014-11-27 DIAGNOSIS — N186 End stage renal disease: Secondary | ICD-10-CM | POA: Insufficient documentation

## 2014-11-27 DIAGNOSIS — F319 Bipolar disorder, unspecified: Secondary | ICD-10-CM | POA: Insufficient documentation

## 2014-11-27 DIAGNOSIS — N898 Other specified noninflammatory disorders of vagina: Secondary | ICD-10-CM

## 2014-11-27 DIAGNOSIS — Z992 Dependence on renal dialysis: Secondary | ICD-10-CM

## 2014-11-27 DIAGNOSIS — I509 Heart failure, unspecified: Secondary | ICD-10-CM | POA: Diagnosis not present

## 2014-11-27 DIAGNOSIS — E875 Hyperkalemia: Secondary | ICD-10-CM | POA: Diagnosis not present

## 2014-11-27 DIAGNOSIS — R21 Rash and other nonspecific skin eruption: Secondary | ICD-10-CM

## 2014-11-27 DIAGNOSIS — Z791 Long term (current) use of non-steroidal anti-inflammatories (NSAID): Secondary | ICD-10-CM | POA: Insufficient documentation

## 2014-11-27 DIAGNOSIS — I120 Hypertensive chronic kidney disease with stage 5 chronic kidney disease or end stage renal disease: Secondary | ICD-10-CM | POA: Diagnosis not present

## 2014-11-27 LAB — CBC WITH DIFFERENTIAL/PLATELET
BASOS PCT: 1 % (ref 0–1)
Basophils Absolute: 0 10*3/uL (ref 0.0–0.1)
EOS ABS: 0.1 10*3/uL (ref 0.0–0.7)
EOS PCT: 2 % (ref 0–5)
HCT: 27.5 % — ABNORMAL LOW (ref 36.0–46.0)
Hemoglobin: 8.7 g/dL — ABNORMAL LOW (ref 12.0–15.0)
LYMPHS ABS: 1.2 10*3/uL (ref 0.7–4.0)
Lymphocytes Relative: 26 % (ref 12–46)
MCH: 27.4 pg (ref 26.0–34.0)
MCHC: 31.6 g/dL (ref 30.0–36.0)
MCV: 86.8 fL (ref 78.0–100.0)
MONO ABS: 0.5 10*3/uL (ref 0.1–1.0)
MONOS PCT: 11 % (ref 3–12)
NEUTROS PCT: 61 % (ref 43–77)
Neutro Abs: 2.8 10*3/uL (ref 1.7–7.7)
PLATELETS: 162 10*3/uL (ref 150–400)
RBC: 3.17 MIL/uL — ABNORMAL LOW (ref 3.87–5.11)
RDW: 17.1 % — AB (ref 11.5–15.5)
WBC: 4.6 10*3/uL (ref 4.0–10.5)

## 2014-11-27 LAB — BASIC METABOLIC PANEL
Anion gap: 15 (ref 5–15)
BUN: 100 mg/dL — AB (ref 6–20)
CALCIUM: 9.6 mg/dL (ref 8.9–10.3)
CO2: 23 mmol/L (ref 22–32)
CREATININE: 15.82 mg/dL — AB (ref 0.44–1.00)
Chloride: 100 mmol/L — ABNORMAL LOW (ref 101–111)
GFR calc non Af Amer: 3 mL/min — ABNORMAL LOW (ref >60)
GFR, EST AFRICAN AMERICAN: 3 mL/min — AB (ref >60)
Glucose, Bld: 89 mg/dL (ref 65–99)
Potassium: 6.5 mmol/L (ref 3.5–5.1)
SODIUM: 138 mmol/L (ref 135–145)

## 2014-11-27 MED ORDER — LIDOCAINE-PRILOCAINE 2.5-2.5 % EX CREA
1.0000 "application " | TOPICAL_CREAM | CUTANEOUS | Status: DC | PRN
  Filled 2014-11-27: qty 5

## 2014-11-27 MED ORDER — DIPHENHYDRAMINE HCL 25 MG PO CAPS
25.0000 mg | ORAL_CAPSULE | Freq: Four times a day (QID) | ORAL | Status: DC | PRN
  Administered 2014-11-27 (×2): 25 mg via ORAL
  Filled 2014-11-27: qty 1

## 2014-11-27 MED ORDER — DIPHENHYDRAMINE HCL 25 MG PO CAPS
ORAL_CAPSULE | ORAL | Status: AC
  Administered 2014-11-27: 25 mg via ORAL
  Filled 2014-11-27: qty 1

## 2014-11-27 MED ORDER — PENTAFLUOROPROP-TETRAFLUOROETH EX AERO
1.0000 "application " | INHALATION_SPRAY | CUTANEOUS | Status: DC | PRN
  Administered 2014-11-27: 1 via TOPICAL
  Filled 2014-11-27 (×2): qty 30

## 2014-11-27 MED ORDER — PENTAFLUOROPROP-TETRAFLUOROETH EX AERO
INHALATION_SPRAY | CUTANEOUS | Status: AC
  Administered 2014-11-27: 1 via TOPICAL
  Filled 2014-11-27: qty 103.5

## 2014-11-27 MED ORDER — HEPARIN SODIUM (PORCINE) 1000 UNIT/ML DIALYSIS
1000.0000 [IU] | INTRAMUSCULAR | Status: DC | PRN
  Filled 2014-11-27: qty 1

## 2014-11-27 MED ORDER — NEPRO/CARBSTEADY PO LIQD
237.0000 mL | ORAL | Status: DC | PRN
  Filled 2014-11-27: qty 237

## 2014-11-27 MED ORDER — ALTEPLASE 2 MG IJ SOLR
2.0000 mg | Freq: Once | INTRAMUSCULAR | Status: DC | PRN
  Filled 2014-11-27: qty 2

## 2014-11-27 MED ORDER — SODIUM CHLORIDE 0.9 % IV SOLN: 100.0000 mL | INTRAVENOUS | Status: DC | PRN

## 2014-11-27 MED ORDER — LIDOCAINE HCL (PF) 1 % IJ SOLN: 5.0000 mL | INTRAMUSCULAR | Status: DC | PRN

## 2014-11-27 NOTE — Discharge Instructions (Signed)
°Emergency Department Resource Guide °1) Find a Doctor and Pay Out of Pocket °Although you won't have to find out who is covered by your insurance plan, it is a good idea to ask around and get recommendations. You will then need to call the office and see if the doctor you have chosen will accept you as a new patient and what types of options they offer for patients who are self-pay. Some doctors offer discounts or will set up payment plans for their patients who do not have insurance, but you will need to ask so you aren't surprised when you get to your appointment. ° °2) Contact Your Local Health Department °Not all health departments have doctors that can see patients for sick visits, but many do, so it is worth a call to see if yours does. If you don't know where your local health department is, you can check in your phone book. The CDC also has a tool to help you locate your state's health department, and many state websites also have listings of all of their local health departments. ° °3) Find a Walk-in Clinic °If your illness is not likely to be very severe or complicated, you may want to try a walk in clinic. These are popping up all over the country in pharmacies, drugstores, and shopping centers. They're usually staffed by nurse practitioners or physician assistants that have been trained to treat common illnesses and complaints. They're usually fairly quick and inexpensive. However, if you have serious medical issues or chronic medical problems, these are probably not your best option. ° °No Primary Care Doctor: °- Call [facility] at  832-8000 - they can help you locate a primary care doctor that  accepts your insurance, provides certain services, etc. °- Physician Referral Service- 1-800-533-3463 ° °Chronic Pain Problems: °Organization         Address  Phone   Notes  °[facility]  (336) 297-2271 Patients need to be referred by their primary care doctor.  ° °Medication  Assistance: °Organization         Address  Phone   Notes  °[place] [facility] [address] °[place], [place] [address] (336) 641-8030 --Must be a resident of [place] °-- Must have NO insurance coverage whatsoever (no Medicaid/ Medicare, etc.) °-- The pt. MUST have a primary care doctor that directs their care regularly and follows them in the community °  °[facility]  (866) 331-1348   °[employer]  (888) 892-1162   ° °Agencies that provide inexpensive medical care: °Organization         Address  Phone   Notes  °[facility]  (336) 832-8035   °[facility]    (336) 832-7272   °[facility] [address] °[place], [place] [address] (336) 832-4777   °[facility] [address], °[place] (336) 271-4999   °[facility]    (336) 373-0678   °[facility]    (336) 272-1050   °[facility] ° [address], [place] Phone:  (336) 832-4444, Fax:  (336) 832-4440 Hours of Operation:  9 am - 6 pm, M-F.  Also accepts Medicaid/Medicare and self-pay.  °[facility] ° [address], [place] Phone: (336) 832-3150, Fax: (336) 832-3151. Hours of Operation:  8:30 am - 5:30 pm, M-F.  Also accepts Medicaid and self-pay.  °[facility] [place] [address], [place] Phone: (336) 878-6027   °[facility] [address], [place], [place] (336)723-1848, Ext. 123 Mondays & Thursdays: 7-9 AM.  First 15 patients are seen on a first come, first serve basis. °  ° °Medicaid-accepting [place] Providers: ° °Organization         Address  Phone   Notes  °[facility] [address], [place] (336) 641-2100 Also accepts self-pay patients.  °[facility] [address], [place] ° (336) 856-9996   °[facility] [address], [place]  (336) 288-8857   °[facility] [address] [place] [address], [place] (336) 299-7000   °Veita Bland [address], [place]  ° (336) 373-1557 Only accepts [place] Access Medicaid patients after they have their name applied to their card.  ° °Self-Pay (no insurance) in [place]: ° °Organization         Address  Phone   Notes  °Sickle Cell Patients, [facility] [address], [place] (336) 832-1970   °[facility] [address], [place] (336) 832-4400   °[facility] ° [address], [place] (336) 992-4800   °[facility]/Dr. Osei-Bonsu ° [address] [place] [address], [place] or [address], [place] (336) 841-8500 Phone number for both [place] and [place] locations is the same.  °[facility] [address], [place] (336) 299-0000   °[facility] [address] [place] [address], [place] or [address] (336) 852-7530 °(336) 878-2260   °[facility] [address], [place] (336) 350-1642, phone; (336) 294-5005, fax Sees patients 1st and 3rd Saturday of every month.  Must not qualify for public or private insurance (i.e. Medicaid, Medicare, [place] Health Choice, Veterans' Benefits) • Household income should be no more than 200% of the poverty level •The clinic cannot treat you if you are pregnant or think you are pregnant • Sexually transmitted diseases are not treated at the clinic.  ° ° °Dental Care: °Organization         Address  Phone  Notes  °[place] [facility] [address], [place] (336) 641-6152 Accepts children up to age 21 who are enrolled in Medicaid or [place] Health Choice; pregnant women with a Medicaid card; and children who have applied for Medicaid or [place] Health Choice, but were declined, whose parents can pay a reduced fee at time of service.  °[place]  [facility] [place]  [address], [place] (336) 641-7733 Accepts children up to age 21 who are enrolled in Medicaid or [place] Health Choice; pregnant women with a Medicaid card; and children who have applied for Medicaid or [place] Health Choice, but were declined, whose parents can pay a reduced fee at time of service.  °[facility] ° [address], [place] (336) 641-4533 Patients are seen by appointment only. Walk-ins are not accepted. [facility] will see patients 18 years of age and older. °Monday - Tuesday (8am-5pm) °Most Wednesdays (8:30-5pm) °$30 per visit, cash only  °[facility] ° [address], [place] (336) 641-4533 Patients are seen by appointment only. Walk-ins are not accepted. [facility] will see patients 18 years of age and older. °One   Wednesday Evening (Monthly: Volunteer Based).  $30 per visit, cash only  °[facility]  (919) 537-3737 for adults; Children under age 4, call Graduate Pediatric Dentistry at (919) [phone]. Children aged 4-14, please call (919) [phone] to request a pediatric application. ° Dental services are provided in all areas of dental care including fillings, crowns and bridges, complete and partial dentures, implants, gum treatment, root canals, and extractions. Preventive care is also provided. Treatment is provided to both adults and children. °Patients are selected via a lottery and there is often a waiting list. °  °[facility] [address], °[place] ° (336) 763-8833 [email or web address] °  °[facility] [address], [place], [place] (336)723-1848, Ext. 123 Second and Fourth Thursday of each month, opens at 6:30 AM; Clinic ends at 9 AM.  Patients are seen on a first-come first-served basis, and a limited number are seen during each clinic.  ° °[facility] ° [address], [place], [place] (336) 723-7904    Eligibility Requirements °You must have lived in [place], [place], or [place] counties for at least the last three months. °  You cannot be eligible for state or federal sponsored healthcare insurance, including Veterans Administration, Medicaid, or Medicare. °  You generally cannot be eligible for healthcare insurance through your employer.  °  How to apply: °Eligibility screenings are held every Tuesday and Wednesday afternoon from 1:00 pm until 4:00 pm. You do not need an appointment for the interview!  °[facility] [address], [place], [place] 336-631-2330   °[facility]  336-342-8273   °[facility]  336-703-3100   °[facility]  336-570-6415   ° °Behavioral Health Resources in the Community: °Intensive Outpatient Programs °Organization         Address  Phone  Notes  °[facility] [address], [place], [place] 336-878-6098   °[facility] [address], [place], [place] 336-832-9800   °[facility] [address], [place], [place] ° 336-882-2125   °[facility] [address],  °[place], [place] 1-800-853-5163 or 336-641-4981   °Substance Abuse Resources °Organization         Address  Phone  Notes  °[facility]  336-882-2125   °[facility]  336-784-9470   °[facility]  336-285-9073   °[facility]  336-845-3988   °Residential & Outpatient Substance Abuse Program  1-800-659-3381   °Psychological Services °Organization         Address  Phone  Notes  °[facility]  336- 832-9600   °[facility]  336- 378-7881   °[facility] [address], [place] 1-800-853-5163 or 336-641-4981   ° °Mobile Crisis Teams °Organization         Address  Phone  Notes  °[facility], Mobile Crisis Care Unit  1-877-626-1772   °Assertive °[facility] ° [address]  [place], [place] 336-834-9664   °Sharon DeEsch [address] ° [place] 336-554-5454   ° °Self-Help/Support Groups °Organization         Address  Phone             Notes  °[facility]  - variety of support groups  336- 373-1402 Call for more information  °Narcotics Anonymous (NA), [facility] [address], °[place]  2 meetings at this location  ° °  Residential Treatment Programs °Organization         Address  Phone  Notes  °[facility] [address],    °[place]  1-866-801-8205   °[facility] ° [address], [place], [place] 704-293-8524   °[facility] [address], [place] 336-845-3988 Admissions: 8am-3pm M-F  °[facility] [address]    °[place], [place] 336-841-1104   °[facility] [address], [place], [place] 336-379-7146   °[facility] [address]  °[place], [place] 336-285-9073   °[facility] - Intensive Outpatient [address], [place], [place] 336-852-3033   °[facility] [address]  °[place], [place] 1-877-615-2722 or 336-784-9470   °[facility] [address] [place], [name] 336-227-7417 Accepts Medicaid  °[facility] [address]  °[place] 1-800-659-3381 Substance Abuse/Addiction Treatment  ° °[place] Behavioral Health Resources °Organization         Address  Phone  Notes  °[facility]  (888) 581-9988   °Julie Brannon, PhD [address] [place], [place]   (336) 349-5553 or (336) 951-0000   °[facility]   [address] °[place], [place] (336) 349-4454   °[facility] [address], [place], [place] (336) 342-8316 Insurance/Medicaid/sponsorship through [facility]  °[facility] [address]                                    [place], [place] (336) 342-8316 Therapy/tele-psych/case    °[facility] [address]  ° [place], [place] (336) 349-2233    °Dr. Arfeen  (336) 349-4544   °[facility] [place]  [employer] [place] [facility] 1) [address], [place] °2) [address], [place] °3)  [address], [place] (336) 349-3220 °(336) 342-7768 ° °(336) 342-8140   °[place] [facility] (336) 342-1394 or (336) 342-3537 (After Hours)    ° ° °

## 2014-11-27 NOTE — ED Notes (Signed)
Pt requesting to go outside, explained to pt that she could not leave ED at this time due to being admitted. Pt requesting to speak to charge nurse.  Volanda Napoleon RN in to speak to pt.  Again explained to pt that she should not leave ED at this time due to liabilities.  Pt states that she is going outside to smoke.  Explained to pt that she would have to be discharged and start process again.  Pt st's that is what she will do

## 2014-11-27 NOTE — ED Provider Notes (Signed)
S: Sara [name] is a 34 y.o. female presents to the ED with need for dialysis. She endorses shortness of breath, 20 pound weight gain and peripheral edema. Patient reports her last dialysis was Thursday - 4 days ago. She reports she presented here on Saturday but was unable to stay.  O:  General: Awake  HEENT: Atraumatic  Resp: Normal effort, no accessory muscle usage, no rales  Abd: Nondistended  Neuro:No focal weakness  Lymph: No adenopathy  A/P:  She will need admission for dialysis. She is fluid overloaded and has an elevated potassium however new EKG changes.   EKG Interpretation  Date/Time:  Monday November 27 2014 10:07:48 EDT Ventricular Rate:  110 PR Interval:  134 QRS Duration: 90 QT Interval:  354 QTC Calculation: 479 R Axis:   49 Text Interpretation:  Sinus tachycardia Anteroseptal infarct, age indeterminate Borderline repolarization abnormality Confirmed by Jeneen Rinks  MD, [place] ([id number]) on 11/27/2014 10:17:13 AM      10:51 AM Discussed with Dr. Melvia Heaps who will admit for dialysis.    Pt was seen by Donnald Garre, PA-C and supervised by Abigail Butts, PA-C and Tanna Furry, MD    [facility], PA-C 11/27/14 [place], MD 11/27/14 [phone]

## 2014-11-27 NOTE — Procedures (Signed)
Patient was seen on dialysis and the procedure was supervised. BFR [address] BP is 179/102.  Patient appears to be tolerating treatment well and has already informed the nurse taking care of her that she is going to sign off 15 minutes early.

## 2014-11-27 NOTE — ED Notes (Signed)
Pt needs dialysis and states has spider bite on head and bumps in pubic hair on vagina. Thought it was razor rash but states spreading and very sore from itching.
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
## 2014-11-28 LAB — POCT I-STAT, CHEM 8
BUN: 50 mg/dL — ABNORMAL HIGH (ref 6–20)
CALCIUM ION: 1.19 mmol/L (ref 1.12–1.23)
CHLORIDE: 99 mmol/L — AB (ref 101–111)
Creatinine, Ser: 9 mg/dL — ABNORMAL HIGH (ref 0.44–1.00)
GLUCOSE: 88 mg/dL (ref 65–99)
HCT: 32 % — ABNORMAL LOW (ref 36.0–46.0)
HEMOGLOBIN: 10.9 g/dL — AB (ref 12.0–15.0)
Potassium: 3.8 mmol/L (ref 3.5–5.1)
SODIUM: 136 mmol/L (ref 135–145)
TCO2: 22 mmol/L (ref 0–100)

## 2014-11-28 LAB — SYPHILIS: RPR W/REFLEX TO RPR TITER AND TREPONEMAL ANTIBODIES, TRADITIONAL SCREENING AND DIAGNOSIS ALGORITHM: RPR Ser Ql: NONREACTIVE

## 2014-11-28 LAB — HIV ANTIBODY (ROUTINE TESTING W REFLEX): HIV Screen 4th Generation wRfx: NONREACTIVE

## 2014-11-29 ENCOUNTER — Emergency Department (HOSPITAL_COMMUNITY)
Admission: EM | Admit: 2014-11-29 | Discharge: 2014-11-29 | Disposition: A | Discharge status: Home / Self Care | Payer: Medicare Other | Attending: Emergency Medicine | Admitting: Emergency Medicine

## 2014-11-29 ENCOUNTER — Encounter (HOSPITAL_COMMUNITY): Payer: Self-pay | Admitting: Physical Medicine and Rehabilitation

## 2014-11-29 DIAGNOSIS — I509 Heart failure, unspecified: Secondary | ICD-10-CM | POA: Insufficient documentation

## 2014-11-29 DIAGNOSIS — I120 Hypertensive chronic kidney disease with stage 5 chronic kidney disease or end stage renal disease: Secondary | ICD-10-CM | POA: Insufficient documentation

## 2014-11-29 DIAGNOSIS — Z8659 Personal history of other mental and behavioral disorders: Secondary | ICD-10-CM | POA: Diagnosis not present

## 2014-11-29 DIAGNOSIS — Z992 Dependence on renal dialysis: Secondary | ICD-10-CM | POA: Insufficient documentation

## 2014-11-29 DIAGNOSIS — Z79899 Other long term (current) drug therapy: Secondary | ICD-10-CM | POA: Diagnosis not present

## 2014-11-29 DIAGNOSIS — Z4932 Encounter for adequacy testing for peritoneal dialysis: Secondary | ICD-10-CM | POA: Diagnosis not present

## 2014-11-29 DIAGNOSIS — Z862 Personal history of diseases of the blood and blood-forming organs and certain disorders involving the immune mechanism: Secondary | ICD-10-CM | POA: Diagnosis not present

## 2014-11-29 DIAGNOSIS — R0602 Shortness of breath: Secondary | ICD-10-CM | POA: Insufficient documentation

## 2014-11-29 DIAGNOSIS — Z720 Tobacco use: Secondary | ICD-10-CM | POA: Insufficient documentation

## 2014-11-29 DIAGNOSIS — Z8739 Personal history of other diseases of the musculoskeletal system and connective tissue: Secondary | ICD-10-CM | POA: Insufficient documentation

## 2014-11-29 DIAGNOSIS — N186 End stage renal disease: Secondary | ICD-10-CM | POA: Diagnosis not present

## 2014-11-29 NOTE — ED Notes (Signed)
Pt is here for dialysis. Pt has no complaints at this time. Dinner tray ordered for pt.
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
## 2014-11-29 NOTE — ED Notes (Signed)
Pt is in stable condition upon d/c and ambulates from ED. 

## 2014-11-29 NOTE — ED Notes (Signed)
Pt presents to department for hemodialysis, last treatment on Monday. Pt also reports SOB. Respirations unlabored. Pt eating cook out in triage room. Pt is alert and oriented x4.

## 2014-11-29 NOTE — Discharge Instructions (Signed)
Please return to the ED very early tomorrow morning so we can dialyze you.   Dialysis Dialysis is a procedure that replaces some of the work healthy kidneys do. It is done when you lose about 85-90% of your kidney function. It may also be done earlier if your symptoms may be improved by dialysis. During dialysis, wastes, salt, and extra water are removed from the blood, and the levels of certain chemicals in the blood (such as potassium) are maintained. Dialysis is done in sessions. Dialysis sessions are continued until the kidneys get better. If the kidneys cannot get better, such as in end-stage kidney disease, dialysis is continued for life or until you receive a new kidney (kidney transplant). There are two types of dialysis: hemodialysis and peritoneal dialysis. WHAT IS HEMODIALYSIS?  Hemodialysis is a type of dialysis in which a machine called a dialyzer is used to filter the blood. Before beginning hemodialysis, you will have surgery to create a site where blood can be removed from the body and returned to the body (vascular access). There are three types of vascular accesses:  Arteriovenous fistula. To create this type of access, an artery is connected to a vein (usually in the arm). A fistula takes 1-6 months to develop after surgery. If it develops properly, it usually lasts longer than the other types of vascular accesses. It is also less likely to become infected and cause blood clots.  Arteriovenous graft. To create this type of access, an artery and a vein in the arm are connected with a tube. A graft may be used within 2-3 weeks of surgery.  A venous catheter. To create this type of access, a thin, flexible tube (catheter) is placed in a large vein in your neck, chest, or groin. A catheter may be used right away. It is usually used as a temporary access when dialysis needs to begin immediately. During hemodialysis, blood leaves the body through your access. It travels through a tube to  the dialyzer, where it is filtered. The blood then returns to your body through another tube. Hemodialysis is usually performed by a health care provider at a hospital or dialysis center three times a week. Visits last about 3-4 hours. It may also be performed with the help of another person at home with training.  WHAT IS PERITONEAL DIALYSIS? Peritoneal dialysis is a type of dialysis in which the thin lining of the abdomen (peritoneum) is used as a filter. Before beginning peritoneal dialysis, you will have surgery to place a catheter in your abdomen. The catheter will be used to transfer a fluid called dialysate to and from your abdomen. At the start of a session, your abdomen is filled with dialysate. During the session, wastes, salt, and extra water in the blood pass through the peritoneum and into the dialysate. The dialysate is drained from the body at the end of the session. The process of filling and draining the dialysate is called an exchange. Exchanges are repeated until you have used up all the dialysate for the day. Peritoneal dialysis may be performed by you at home or at almost any other location. It is done every day. You may need up to five exchanges a day. The amount of time the dialysate is in your body between exchanges is called a dwell. The dwell depends on the number of exchanges needed and the characteristics of the peritoneum. It usually varies from 1.5-3 hours. You may go about your day normally between exchanges. Alternately, the exchanges  may be done at night while you sleep, using a machine called a cycler. WHICH TYPE OF DIALYSIS SHOULD I CHOOSE?  Both hemodialysis and peritoneal dialysis have advantages and disadvantages. Talk to your health care provider about which type of dialysis would be best for you. Your lifestyle and preferences should be considered along with your medical condition. In some cases, only one type of dialysis may be an option.  Advantages of  hemodialysis  It is done less often than peritoneal dialysis.  Someone else can do the dialysis for you.  If you go to a dialysis center, your health care provider will be able to recognize any problems right away.  If you go to a dialysis center, you can interact with others who are having dialysis. This can provide you with emotional support. Disadvantages of hemodialysis  Hemodialysis may cause cramps and low blood pressure. It may leave you feeling tired on the days you have the treatment.  If you go to a dialysis center, you will need to make weekly appointments and work around the center's schedule.  You will need to take extra care when traveling. If you go to a dialysis center, you will need to make special arrangements to visit a dialysis center near your destination. If you are having treatments at home, you will need to take the dialyzer with you to your destination.  You will need to avoid more foods than you would need to avoid on peritoneal dialysis. Advantages of peritoneal dialysis  It is less likely than hemodialysis to cause cramps and low blood pressure.  You may do exchanges on your own wherever you are, including when you travel.  You do not need to avoid as many foods as you do on hemodialysis. Disadvantages of peritoneal dialysis  It is done more often than hemodialysis.  Performing peritoneal dialysis requires you to have dexterity of the hands. You must also be able to lift bags.  You will have to learn sterilization techniques. You will need to practice them every day to reduce the risk of infection. WHAT CHANGES WILL I NEED TO MAKE TO MY DIET DURING DIALYSIS? Both hemodialysis and peritoneal dialysis require you to make some changes to your diet. For example, you will need to limit your intake of foods high in the minerals phosphorus and potassium. You will also need to limit your fluid intake. Your dietitian can help you plan meals. A good meal plan can  improve your dialysis and your health.  WHAT SHOULD I EXPECT WHEN BEGINNING DIALYSIS? Adjusting to the dialysis treatment, schedule, and diet can take some time. You may need to stop working and may not be able to do some of the things you normally do. You may feel anxious or depressed when beginning dialysis. Eventually, many people feel better overall because of dialysis. Some people are able to return to work after making some changes, such as reducing work intensity. WHERE CAN I FIND MORE INFORMATION?   [place]: www.kidney.org  American Association of Kidney Patients: BombTimer.gl  [employer]: www.kidneyfund.org Document Released: 06/14/2002 Document Revised: 08/08/2013 Document Reviewed: 05/18/2012 [facility] Patient Information 2015 [place], [place]. This information is not intended to replace advice given to you by your health care provider. Make sure you discuss any questions you have with your health care provider.

## 2014-11-29 NOTE — ED Notes (Signed)
Pt refusing to have EKG at present.

## 2014-11-30 ENCOUNTER — Encounter (HOSPITAL_COMMUNITY): Payer: Self-pay

## 2014-11-30 ENCOUNTER — Observation Stay (HOSPITAL_COMMUNITY)
Admission: EM | Admit: 2014-11-30 | Discharge: 2014-11-30 | Disposition: A | Discharge status: Home / Self Care | Payer: Medicare Other | Source: Home / Self Care | Attending: Emergency Medicine | Admitting: Emergency Medicine

## 2014-11-30 ENCOUNTER — Emergency Department (HOSPITAL_COMMUNITY): Payer: Medicare Other

## 2014-11-30 ENCOUNTER — Inpatient Hospital Stay (HOSPITAL_COMMUNITY)
Admission: EM | Admit: 2014-11-30 | Discharge: 2014-12-01 | DRG: 291 | Discharge status: Against Medical Advice | Payer: Medicare Other | Attending: Internal Medicine | Admitting: Internal Medicine

## 2014-11-30 ENCOUNTER — Encounter (HOSPITAL_COMMUNITY): Payer: Self-pay | Admitting: Emergency Medicine

## 2014-11-30 DIAGNOSIS — I5021 Acute systolic (congestive) heart failure: Secondary | ICD-10-CM

## 2014-11-30 DIAGNOSIS — I509 Heart failure, unspecified: Secondary | ICD-10-CM

## 2014-11-30 DIAGNOSIS — N19 Unspecified kidney failure: Secondary | ICD-10-CM | POA: Diagnosis not present

## 2014-11-30 DIAGNOSIS — E8770 Fluid overload, unspecified: Secondary | ICD-10-CM | POA: Diagnosis not present

## 2014-11-30 DIAGNOSIS — N186 End stage renal disease: Secondary | ICD-10-CM | POA: Diagnosis present

## 2014-11-30 DIAGNOSIS — I120 Hypertensive chronic kidney disease with stage 5 chronic kidney disease or end stage renal disease: Secondary | ICD-10-CM | POA: Diagnosis present

## 2014-11-30 DIAGNOSIS — F209 Schizophrenia, unspecified: Secondary | ICD-10-CM | POA: Diagnosis present

## 2014-11-30 DIAGNOSIS — R0600 Dyspnea, unspecified: Secondary | ICD-10-CM | POA: Diagnosis present

## 2014-11-30 DIAGNOSIS — F203 Undifferentiated schizophrenia: Secondary | ICD-10-CM | POA: Diagnosis not present

## 2014-11-30 DIAGNOSIS — Z9119 Patient's noncompliance with other medical treatment and regimen: Secondary | ICD-10-CM | POA: Diagnosis not present

## 2014-11-30 DIAGNOSIS — D649 Anemia, unspecified: Secondary | ICD-10-CM | POA: Diagnosis present

## 2014-11-30 DIAGNOSIS — Z720 Tobacco use: Secondary | ICD-10-CM | POA: Diagnosis present

## 2014-11-30 DIAGNOSIS — Z992 Dependence on renal dialysis: Secondary | ICD-10-CM | POA: Diagnosis not present

## 2014-11-30 DIAGNOSIS — I10 Essential (primary) hypertension: Secondary | ICD-10-CM | POA: Diagnosis present

## 2014-11-30 DIAGNOSIS — M329 Systemic lupus erythematosus, unspecified: Secondary | ICD-10-CM | POA: Diagnosis present

## 2014-11-30 DIAGNOSIS — F17210 Nicotine dependence, cigarettes, uncomplicated: Secondary | ICD-10-CM | POA: Diagnosis present

## 2014-11-30 DIAGNOSIS — R0602 Shortness of breath: Secondary | ICD-10-CM | POA: Diagnosis not present

## 2014-11-30 DIAGNOSIS — M3214 Glomerular disease in systemic lupus erythematosus: Secondary | ICD-10-CM | POA: Diagnosis not present

## 2014-11-30 DIAGNOSIS — I150 Renovascular hypertension: Secondary | ICD-10-CM | POA: Diagnosis not present

## 2014-11-30 LAB — I-STAT CHEM 8, ED
BUN: 91 mg/dL — ABNORMAL HIGH (ref 6–20)
CALCIUM ION: 1.16 mmol/L (ref 1.12–1.23)
CHLORIDE: 100 mmol/L — AB (ref 101–111)
Creatinine, Ser: 14.5 mg/dL — ABNORMAL HIGH (ref 0.44–1.00)
Glucose, Bld: 95 mg/dL (ref 65–99)
HCT: 28 % — ABNORMAL LOW (ref 36.0–46.0)
Hemoglobin: 9.5 g/dL — ABNORMAL LOW (ref 12.0–15.0)
POTASSIUM: 5 mmol/L (ref 3.5–5.1)
SODIUM: 137 mmol/L (ref 135–145)
TCO2: 20 mmol/L (ref 0–100)

## 2014-11-30 LAB — I-STAT TROPONIN, ED: Troponin i, poc: 0.08 ng/mL (ref 0.00–0.08)

## 2014-11-30 MED ORDER — SODIUM CHLORIDE 0.9 % IV SOLN: 100.0000 mL | INTRAVENOUS | Status: DC | PRN

## 2014-11-30 MED ORDER — HEPARIN SODIUM (PORCINE) 1000 UNIT/ML DIALYSIS
20.0000 [IU]/kg | INTRAMUSCULAR | Status: DC | PRN
  Administered 2014-11-30: 1400 [IU] via INTRAVENOUS_CENTRAL
  Filled 2014-11-30: qty 2

## 2014-11-30 MED ORDER — PENTAFLUOROPROP-TETRAFLUOROETH EX AERO: 1.0000 "application " | INHALATION_SPRAY | CUTANEOUS | Status: DC | PRN

## 2014-11-30 MED ORDER — DIPHENHYDRAMINE HCL 25 MG PO CAPS: 25.0000 mg | ORAL_CAPSULE | Freq: Four times a day (QID) | ORAL | Status: DC | PRN

## 2014-11-30 MED ORDER — LIDOCAINE HCL (PF) 1 % IJ SOLN
5.0000 mL | INTRAMUSCULAR | Status: DC | PRN
  Administered 2014-11-30: 5 mL via INTRADERMAL

## 2014-11-30 MED ORDER — NEPRO/CARBSTEADY PO LIQD: 237.0000 mL | ORAL | Status: DC | PRN

## 2014-11-30 MED ORDER — HEPARIN SODIUM (PORCINE) 1000 UNIT/ML DIALYSIS
1000.0000 [IU] | INTRAMUSCULAR | Status: DC | PRN
  Filled 2014-11-30: qty 1

## 2014-11-30 MED ORDER — ACETAMINOPHEN 325 MG PO TABS
650.0000 mg | ORAL_TABLET | Freq: Four times a day (QID) | ORAL | Status: DC | PRN
  Administered 2014-11-30 – 2014-12-01 (×2): 650 mg via ORAL
  Filled 2014-11-30 (×2): qty 2

## 2014-11-30 MED ORDER — HEPARIN SODIUM (PORCINE) 1000 UNIT/ML IJ SOLN
INTRAMUSCULAR | Status: AC
  Administered 2014-11-30: 1400 [IU] via INTRAVENOUS_CENTRAL
  Filled 2014-11-30: qty 2

## 2014-11-30 MED ORDER — DIPHENHYDRAMINE HCL 25 MG PO CAPS
25.0000 mg | ORAL_CAPSULE | Freq: Four times a day (QID) | ORAL | Status: DC | PRN
Start: 2014-11-30 — End: 2014-12-01
  Administered 2014-11-30: 25 mg via ORAL
  Filled 2014-11-30: qty 1

## 2014-11-30 MED ORDER — EPOETIN ALFA 10000 UNIT/ML IJ SOLN: 10000.0000 [IU] | INTRAMUSCULAR | Status: DC

## 2014-11-30 MED ORDER — ALTEPLASE 2 MG IJ SOLR
2.0000 mg | Freq: Once | INTRAMUSCULAR | Status: DC | PRN
  Filled 2014-11-30: qty 2

## 2014-11-30 MED ORDER — LIDOCAINE-PRILOCAINE 2.5-2.5 % EX CREA: 1.0000 "application " | TOPICAL_CREAM | CUTANEOUS | Status: DC | PRN

## 2014-11-30 MED ORDER — LIDOCAINE HCL (PF) 1 % IJ SOLN
INTRAMUSCULAR | Status: AC
  Administered 2014-11-30: 5 mL via INTRADERMAL
  Filled 2014-11-30: qty 5

## 2014-11-30 NOTE — ED Provider Notes (Signed)
Pt left [facility] a few minutes ago to smoke a cigarette.  She had been  admitted for dialysis.  We will readmit the pt to get dialysis.  She is stable.  Lungs clear. Vitals stable  Milton Ferguson, MD 11/30/14 2028

## 2014-11-30 NOTE — ED Notes (Signed)
Pt present to ED c/o SOB and in need of Dialysis (missed Wednesday, had dialysis on Monday).
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
## 2014-11-30 NOTE — ED Notes (Signed)
States "I started having shortness of breath on Tuesday and I need to get dialysis"
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
## 2014-12-01 ENCOUNTER — Encounter (HOSPITAL_COMMUNITY): Payer: Self-pay | Admitting: *Deleted

## 2014-12-01 DIAGNOSIS — R0602 Shortness of breath: Secondary | ICD-10-CM | POA: Diagnosis present

## 2014-12-01 DIAGNOSIS — D649 Anemia, unspecified: Secondary | ICD-10-CM | POA: Diagnosis present

## 2014-12-01 DIAGNOSIS — F17210 Nicotine dependence, cigarettes, uncomplicated: Secondary | ICD-10-CM | POA: Diagnosis present

## 2014-12-01 DIAGNOSIS — I5021 Acute systolic (congestive) heart failure: Secondary | ICD-10-CM | POA: Diagnosis present

## 2014-12-01 DIAGNOSIS — I5032 Chronic diastolic (congestive) heart failure: Secondary | ICD-10-CM

## 2014-12-01 DIAGNOSIS — F209 Schizophrenia, unspecified: Secondary | ICD-10-CM | POA: Diagnosis present

## 2014-12-01 DIAGNOSIS — M329 Systemic lupus erythematosus, unspecified: Secondary | ICD-10-CM | POA: Diagnosis present

## 2014-12-01 DIAGNOSIS — E8770 Fluid overload, unspecified: Secondary | ICD-10-CM | POA: Diagnosis present

## 2014-12-01 DIAGNOSIS — Z992 Dependence on renal dialysis: Secondary | ICD-10-CM | POA: Diagnosis not present

## 2014-12-01 DIAGNOSIS — I120 Hypertensive chronic kidney disease with stage 5 chronic kidney disease or end stage renal disease: Secondary | ICD-10-CM | POA: Diagnosis present

## 2014-12-01 DIAGNOSIS — N186 End stage renal disease: Secondary | ICD-10-CM | POA: Diagnosis present

## 2014-12-01 NOTE — Progress Notes (Signed)
Left AMA ambulatory, condition stable.
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
## 2014-12-01 NOTE — Discharge Summary (Signed)
Pt left the hospital AMA before I was able to exam her. No prescriptions or discharge instructions were written. She sis receive hemodialysis.  Sandi Raveling Leonie Green, acting as scribe, recorded this note contemporaneously in the presence of Dr. Domingo Mend, M.D. on 12/01/2014.  I have reviewed the above documentation for accuracy and completeness, and I agree with the above.  Domingo Mend, MD [facility] Pager: [phone]

## 2014-12-01 NOTE — Procedures (Signed)
   HEMODIALYSIS TREATMENT NOTE:  3.5 hour low-heparin dialysis completed via right upper arm AVF (16g ante/retrograde). Goal met: 3.9 liters removed without interruption in ultrafiltration.  All blood was reinfused. Hemostasis achieved within 14 minutes. Pt hypertensive before, during and after HD. Post-HD BP 175/104. Report given to [place], Therapist, sports.  Rockwell Alexandria, RN, CDN

## 2014-12-02 ENCOUNTER — Encounter (HOSPITAL_COMMUNITY): Payer: Self-pay | Admitting: Emergency Medicine

## 2014-12-02 ENCOUNTER — Non-Acute Institutional Stay (HOSPITAL_COMMUNITY)
Admission: EM | Admit: 2014-12-02 | Discharge: 2014-12-02 | Disposition: A | Discharge status: Home / Self Care | Payer: Medicare Other | Attending: Emergency Medicine | Admitting: Emergency Medicine

## 2014-12-02 DIAGNOSIS — I5022 Chronic systolic (congestive) heart failure: Secondary | ICD-10-CM | POA: Diagnosis not present

## 2014-12-02 DIAGNOSIS — F209 Schizophrenia, unspecified: Secondary | ICD-10-CM | POA: Insufficient documentation

## 2014-12-02 DIAGNOSIS — Z992 Dependence on renal dialysis: Secondary | ICD-10-CM | POA: Diagnosis not present

## 2014-12-02 DIAGNOSIS — F17210 Nicotine dependence, cigarettes, uncomplicated: Secondary | ICD-10-CM | POA: Diagnosis not present

## 2014-12-02 DIAGNOSIS — I120 Hypertensive chronic kidney disease with stage 5 chronic kidney disease or end stage renal disease: Secondary | ICD-10-CM | POA: Insufficient documentation

## 2014-12-02 DIAGNOSIS — M3214 Glomerular disease in systemic lupus erythematosus: Secondary | ICD-10-CM | POA: Insufficient documentation

## 2014-12-02 DIAGNOSIS — N186 End stage renal disease: Secondary | ICD-10-CM | POA: Diagnosis not present

## 2014-12-02 DIAGNOSIS — F319 Bipolar disorder, unspecified: Secondary | ICD-10-CM | POA: Diagnosis not present

## 2014-12-02 DIAGNOSIS — Z79899 Other long term (current) drug therapy: Secondary | ICD-10-CM | POA: Diagnosis not present

## 2014-12-02 DIAGNOSIS — D649 Anemia, unspecified: Secondary | ICD-10-CM | POA: Insufficient documentation

## 2014-12-02 LAB — HEPATITIS B SURFACE ANTIGEN: HEP B S AG: NEGATIVE

## 2014-12-02 MED ORDER — DARBEPOETIN ALFA 100 MCG/0.5ML IJ SOSY: 100.0000 ug | PREFILLED_SYRINGE | INTRAMUSCULAR | Status: DC

## 2014-12-02 MED ORDER — SODIUM CHLORIDE 0.9 % IV SOLN: 250.0000 mg | Freq: Once | INTRAVENOUS | Status: DC

## 2014-12-02 NOTE — ED Notes (Signed)
Called dialysis for update and pt will not be able to go to dialysis until night shift.
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
## 2014-12-02 NOTE — ED Notes (Signed)
Pt requesting to leave. MD made aware

## 2014-12-02 NOTE — ED Notes (Signed)
Pt. Stated, here for dialysis

## 2014-12-03 ENCOUNTER — Observation Stay (HOSPITAL_COMMUNITY)
Admission: EM | Admit: 2014-12-03 | Discharge: 2014-12-04 | Disposition: A | Discharge status: Home / Self Care | Payer: Medicare Other | Attending: Nephrology | Admitting: Nephrology

## 2014-12-03 ENCOUNTER — Encounter (HOSPITAL_COMMUNITY): Payer: Self-pay | Admitting: *Deleted

## 2014-12-03 DIAGNOSIS — Z992 Dependence on renal dialysis: Secondary | ICD-10-CM | POA: Diagnosis not present

## 2014-12-03 DIAGNOSIS — I120 Hypertensive chronic kidney disease with stage 5 chronic kidney disease or end stage renal disease: Secondary | ICD-10-CM | POA: Diagnosis not present

## 2014-12-03 DIAGNOSIS — I5022 Chronic systolic (congestive) heart failure: Secondary | ICD-10-CM | POA: Diagnosis not present

## 2014-12-03 DIAGNOSIS — N186 End stage renal disease: Secondary | ICD-10-CM | POA: Diagnosis present

## 2014-12-03 DIAGNOSIS — F17210 Nicotine dependence, cigarettes, uncomplicated: Secondary | ICD-10-CM | POA: Insufficient documentation

## 2014-12-03 LAB — BASIC METABOLIC PANEL
ANION GAP: 17 — AB (ref 5–15)
BUN: 84 mg/dL — ABNORMAL HIGH (ref 6–20)
CALCIUM: 9.6 mg/dL (ref 8.9–10.3)
CO2: 23 mmol/L (ref 22–32)
Chloride: 98 mmol/L — ABNORMAL LOW (ref 101–111)
Creatinine, Ser: 14.46 mg/dL — ABNORMAL HIGH (ref 0.44–1.00)
GFR calc Af Amer: 3 mL/min — ABNORMAL LOW (ref >60)
GFR, EST NON AFRICAN AMERICAN: 3 mL/min — AB (ref >60)
GLUCOSE: 87 mg/dL (ref 65–99)
Potassium: 4.9 mmol/L (ref 3.5–5.1)
SODIUM: 138 mmol/L (ref 135–145)

## 2014-12-03 LAB — CBC
HCT: 26.6 % — ABNORMAL LOW (ref 36.0–46.0)
HEMOGLOBIN: 8.5 g/dL — AB (ref 12.0–15.0)
MCH: 27.2 pg (ref 26.0–34.0)
MCHC: 32 g/dL (ref 30.0–36.0)
MCV: 85 fL (ref 78.0–100.0)
Platelets: 186 10*3/uL (ref 150–400)
RBC: 3.13 MIL/uL — ABNORMAL LOW (ref 3.87–5.11)
RDW: 16.6 % — AB (ref 11.5–15.5)
WBC: 3.8 10*3/uL — AB (ref 4.0–10.5)

## 2014-12-03 MED ORDER — LIDOCAINE-PRILOCAINE 2.5-2.5 % EX CREA
1.0000 "application " | TOPICAL_CREAM | CUTANEOUS | Status: DC | PRN
  Filled 2014-12-03: qty 5

## 2014-12-03 MED ORDER — PENTAFLUOROPROP-TETRAFLUOROETH EX AERO
1.0000 "application " | INHALATION_SPRAY | CUTANEOUS | Status: DC | PRN
  Filled 2014-12-03: qty 30

## 2014-12-03 MED ORDER — SODIUM CHLORIDE 0.9 % IV SOLN: 100.0000 mL | INTRAVENOUS | Status: DC | PRN

## 2014-12-03 MED ORDER — HEPARIN SODIUM (PORCINE) 1000 UNIT/ML DIALYSIS
1000.0000 [IU] | INTRAMUSCULAR | Status: DC | PRN
  Filled 2014-12-03: qty 1

## 2014-12-03 MED ORDER — NEPRO/CARBSTEADY PO LIQD
237.0000 mL | ORAL | Status: DC | PRN
  Filled 2014-12-03: qty 237

## 2014-12-03 MED ORDER — ACETAMINOPHEN 325 MG PO TABS
650.0000 mg | ORAL_TABLET | Freq: Once | ORAL | Status: AC
Start: 2014-12-03 — End: 2014-12-03
  Administered 2014-12-03: 650 mg via ORAL

## 2014-12-03 MED ORDER — HEPARIN SODIUM (PORCINE) 1000 UNIT/ML DIALYSIS
5000.0000 [IU] | Freq: Once | INTRAMUSCULAR | Status: DC
  Filled 2014-12-03: qty 5

## 2014-12-03 MED ORDER — ACETAMINOPHEN 325 MG PO TABS
ORAL_TABLET | ORAL | Status: AC
  Filled 2014-12-03: qty 2

## 2014-12-03 MED ORDER — LIDOCAINE HCL (PF) 1 % IJ SOLN: 5.0000 mL | INTRAMUSCULAR | Status: DC | PRN

## 2014-12-03 MED ORDER — ALTEPLASE 2 MG IJ SOLR
2.0000 mg | Freq: Once | INTRAMUSCULAR | Status: DC | PRN
  Filled 2014-12-03: qty 2

## 2014-12-03 NOTE — Procedures (Signed)
  I was present at this dialysis session, have reviewed the session itself and made  appropriate changes Rob Kayela Humphres MD (pgr) [phone]    (c) [phone] 09/23/2014, 10:31 AM    

## 2014-12-03 NOTE — ED Notes (Signed)
Pt states that "I need dialysis"  Last treatment was Thursday.  Pt has been having some joint soreness and sob that she has when "its time for dialysis"

## 2014-12-03 NOTE — ED Notes (Signed)
Pt informed by Junie Panning, RN that she would be going to dialysis around 10pm. Pt verbalized understanding.

## 2014-12-03 NOTE — ED Notes (Signed)
Gave pt a warm blanket.  No distress at this time.  Pt is ambulatory in the waiting area
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
## 2014-12-03 NOTE — ED Notes (Signed)
Meal tray ordered for pt  

## 2014-12-03 NOTE — ED Notes (Addendum)
Pt came to unit demanding coffee and not wanting to wait for RN to get finished in another room.  Pt standing at nursing station cussing and telling staff to do there work and get her what she wants.  Pt asked to go back to her room because other pts were getting upset.  Pt yelled and cussed while walking back to her room.

## 2014-12-03 NOTE — ED Notes (Signed)
Pt was give appl sauce and coffee with two creamers and seven sugars

## 2014-12-05 ENCOUNTER — Encounter (HOSPITAL_COMMUNITY): Payer: Self-pay | Admitting: Emergency Medicine

## 2014-12-05 ENCOUNTER — Non-Acute Institutional Stay (HOSPITAL_COMMUNITY)
Admission: EM | Admit: 2014-12-05 | Discharge: 2014-12-05 | Disposition: A | Discharge status: Home / Self Care | Payer: Medicare Other | Attending: Emergency Medicine | Admitting: Emergency Medicine

## 2014-12-05 DIAGNOSIS — I5022 Chronic systolic (congestive) heart failure: Secondary | ICD-10-CM | POA: Diagnosis not present

## 2014-12-05 DIAGNOSIS — N186 End stage renal disease: Secondary | ICD-10-CM

## 2014-12-05 DIAGNOSIS — F319 Bipolar disorder, unspecified: Secondary | ICD-10-CM | POA: Diagnosis not present

## 2014-12-05 DIAGNOSIS — Z992 Dependence on renal dialysis: Secondary | ICD-10-CM | POA: Insufficient documentation

## 2014-12-05 DIAGNOSIS — F209 Schizophrenia, unspecified: Secondary | ICD-10-CM | POA: Insufficient documentation

## 2014-12-05 DIAGNOSIS — R5383 Other fatigue: Secondary | ICD-10-CM | POA: Diagnosis not present

## 2014-12-05 DIAGNOSIS — Z79899 Other long term (current) drug therapy: Secondary | ICD-10-CM | POA: Insufficient documentation

## 2014-12-05 DIAGNOSIS — I120 Hypertensive chronic kidney disease with stage 5 chronic kidney disease or end stage renal disease: Secondary | ICD-10-CM | POA: Diagnosis not present

## 2014-12-05 DIAGNOSIS — D649 Anemia, unspecified: Secondary | ICD-10-CM | POA: Diagnosis not present

## 2014-12-05 DIAGNOSIS — M791 Myalgia: Secondary | ICD-10-CM | POA: Diagnosis not present

## 2014-12-05 DIAGNOSIS — I10 Essential (primary) hypertension: Secondary | ICD-10-CM

## 2014-12-05 DIAGNOSIS — M3214 Glomerular disease in systemic lupus erythematosus: Secondary | ICD-10-CM | POA: Diagnosis not present

## 2014-12-05 DIAGNOSIS — F17210 Nicotine dependence, cigarettes, uncomplicated: Secondary | ICD-10-CM | POA: Insufficient documentation

## 2014-12-05 DIAGNOSIS — E8779 Other fluid overload: Secondary | ICD-10-CM | POA: Diagnosis present

## 2014-12-05 LAB — BASIC METABOLIC PANEL
ANION GAP: 13 (ref 5–15)
BUN: 50 mg/dL — ABNORMAL HIGH (ref 6–20)
CHLORIDE: 100 mmol/L — AB (ref 101–111)
CO2: 25 mmol/L (ref 22–32)
Calcium: 9.4 mg/dL (ref 8.9–10.3)
Creatinine, Ser: 11.19 mg/dL — ABNORMAL HIGH (ref 0.44–1.00)
GFR calc non Af Amer: 4 mL/min — ABNORMAL LOW (ref >60)
GFR, EST AFRICAN AMERICAN: 5 mL/min — AB (ref >60)
Glucose, Bld: 108 mg/dL — ABNORMAL HIGH (ref 65–99)
POTASSIUM: 4.4 mmol/L (ref 3.5–5.1)
SODIUM: 138 mmol/L (ref 135–145)

## 2014-12-05 LAB — CBC WITH DIFFERENTIAL/PLATELET
Basophils Absolute: 0 10*3/uL (ref 0.0–0.1)
Basophils Relative: 1 % (ref 0–1)
EOS ABS: 0.1 10*3/uL (ref 0.0–0.7)
Eosinophils Relative: 3 % (ref 0–5)
HEMATOCRIT: 28.8 % — AB (ref 36.0–46.0)
HEMOGLOBIN: 9.3 g/dL — AB (ref 12.0–15.0)
LYMPHS ABS: 1.3 10*3/uL (ref 0.7–4.0)
Lymphocytes Relative: 34 % (ref 12–46)
MCH: 28.4 pg (ref 26.0–34.0)
MCHC: 32.3 g/dL (ref 30.0–36.0)
MCV: 88.1 fL (ref 78.0–100.0)
MONOS PCT: 10 % (ref 3–12)
Monocytes Absolute: 0.4 10*3/uL (ref 0.1–1.0)
NEUTROS ABS: 2 10*3/uL (ref 1.7–7.7)
NEUTROS PCT: 52 % (ref 43–77)
Platelets: 192 10*3/uL (ref 150–400)
RBC: 3.27 MIL/uL — AB (ref 3.87–5.11)
RDW: 16.8 % — ABNORMAL HIGH (ref 11.5–15.5)
WBC: 3.9 10*3/uL — AB (ref 4.0–10.5)
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
## 2014-12-05 NOTE — ED Notes (Signed)
Pt left AMA. Notified dialysis that pt left.

## 2014-12-05 NOTE — ED Notes (Signed)
Pt informed nurse she was leaving. She called to check on her place in line for dialysis and was told it was not until after midnight- so she said she is not waiting that long. Pt aware that she will start all over when she comes back. RN counseled pt on not leaving. Pt states she still wants to leave. Pt did not sign for leaving.

## 2014-12-05 NOTE — ED Notes (Signed)
Pt here for routine dialysis; pt sts last dialysis was Sunday; pt sts some body aches due to needing fluid off

## 2014-12-06 ENCOUNTER — Emergency Department (HOSPITAL_COMMUNITY)
Admission: EM | Admit: 2014-12-06 | Discharge: 2014-12-06 | Disposition: A | Discharge status: Home / Self Care | Payer: Medicare Other

## 2014-12-07 ENCOUNTER — Encounter (HOSPITAL_COMMUNITY): Payer: Self-pay | Admitting: Emergency Medicine

## 2014-12-07 ENCOUNTER — Encounter (HOSPITAL_COMMUNITY): Payer: Self-pay | Admitting: General Practice

## 2014-12-07 ENCOUNTER — Non-Acute Institutional Stay (HOSPITAL_COMMUNITY)
Admission: EM | Admit: 2014-12-07 | Discharge: 2014-12-07 | Disposition: A | Discharge status: Home / Self Care | Payer: Medicare Other | Source: Home / Self Care | Attending: Emergency Medicine | Admitting: Emergency Medicine

## 2014-12-07 ENCOUNTER — Emergency Department (HOSPITAL_COMMUNITY)
Admission: EM | Admit: 2014-12-07 | Discharge: 2014-12-07 | Discharge status: Against Medical Advice | Payer: Medicare Other | Attending: Emergency Medicine | Admitting: Emergency Medicine

## 2014-12-07 DIAGNOSIS — R000 Tachycardia, unspecified: Secondary | ICD-10-CM | POA: Insufficient documentation

## 2014-12-07 DIAGNOSIS — N186 End stage renal disease: Secondary | ICD-10-CM | POA: Diagnosis not present

## 2014-12-07 DIAGNOSIS — I509 Heart failure, unspecified: Secondary | ICD-10-CM | POA: Insufficient documentation

## 2014-12-07 DIAGNOSIS — I120 Hypertensive chronic kidney disease with stage 5 chronic kidney disease or end stage renal disease: Secondary | ICD-10-CM | POA: Insufficient documentation

## 2014-12-07 DIAGNOSIS — Z992 Dependence on renal dialysis: Secondary | ICD-10-CM | POA: Insufficient documentation

## 2014-12-07 DIAGNOSIS — Z8739 Personal history of other diseases of the musculoskeletal system and connective tissue: Secondary | ICD-10-CM | POA: Insufficient documentation

## 2014-12-07 DIAGNOSIS — Z4932 Encounter for adequacy testing for peritoneal dialysis: Secondary | ICD-10-CM | POA: Diagnosis not present

## 2014-12-07 DIAGNOSIS — F99 Mental disorder, not otherwise specified: Secondary | ICD-10-CM | POA: Diagnosis not present

## 2014-12-07 DIAGNOSIS — Z862 Personal history of diseases of the blood and blood-forming organs and certain disorders involving the immune mechanism: Secondary | ICD-10-CM | POA: Diagnosis not present

## 2014-12-07 DIAGNOSIS — Z720 Tobacco use: Secondary | ICD-10-CM | POA: Diagnosis not present

## 2014-12-07 LAB — RENAL FUNCTION PANEL
ANION GAP: 18 — AB (ref 5–15)
Albumin: 3 g/dL — ABNORMAL LOW (ref 3.5–5.0)
BUN: 90 mg/dL — ABNORMAL HIGH (ref 6–20)
CALCIUM: 9.8 mg/dL (ref 8.9–10.3)
CO2: 19 mmol/L — AB (ref 22–32)
CREATININE: 14.92 mg/dL — AB (ref 0.44–1.00)
Chloride: 99 mmol/L — ABNORMAL LOW (ref 101–111)
GFR, EST AFRICAN AMERICAN: 3 mL/min — AB (ref >60)
GFR, EST NON AFRICAN AMERICAN: 3 mL/min — AB (ref >60)
Glucose, Bld: 118 mg/dL — ABNORMAL HIGH (ref 65–99)
Phosphorus: 11.3 mg/dL — ABNORMAL HIGH (ref 2.5–4.6)
Potassium: 5.5 mmol/L — ABNORMAL HIGH (ref 3.5–5.1)
SODIUM: 136 mmol/L (ref 135–145)

## 2014-12-07 LAB — CBC
HCT: 27 % — ABNORMAL LOW (ref 36.0–46.0)
HEMOGLOBIN: 8.6 g/dL — AB (ref 12.0–15.0)
MCH: 27.1 pg (ref 26.0–34.0)
MCHC: 31.9 g/dL (ref 30.0–36.0)
MCV: 85.2 fL (ref 78.0–100.0)
Platelets: 198 10*3/uL (ref 150–400)
RBC: 3.17 MIL/uL — AB (ref 3.87–5.11)
RDW: 16.6 % — ABNORMAL HIGH (ref 11.5–15.5)
WBC: 4 10*3/uL (ref 4.0–10.5)

## 2014-12-07 NOTE — ED Notes (Signed)
Pt st's she does not want to be seen for other complaints just wants to go to dialysis

## 2014-12-07 NOTE — ED Notes (Addendum)
Pt st's she is here for dialysis also c/o mucus in her stool and st's she has drainage from breast.  Pt st's she was here earlier today but called dialysis and was told that they could not get to her until 6pm so she left
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
## 2014-12-07 NOTE — ED Notes (Signed)
Pt brought in for a dialysis treatment. Pt was last dialyzed on Sunday 12/03/14. Pt is A/O.

## 2014-12-07 NOTE — ED Notes (Signed)
RN went to pt room to round on pt.  Pt was not in room and hospital gown was on bed.  Greeter confirms that she saw pt leave.  Szekalski PA and charge RN made aware.  Pt to be marked [facility].

## 2014-12-09 ENCOUNTER — Non-Acute Institutional Stay (HOSPITAL_COMMUNITY)
Admission: EM | Admit: 2014-12-09 | Discharge: 2014-12-11 | Disposition: A | Discharge status: Home / Self Care | Payer: Medicare Other | Attending: Emergency Medicine | Admitting: Emergency Medicine

## 2014-12-09 ENCOUNTER — Encounter (HOSPITAL_COMMUNITY): Payer: Self-pay | Admitting: Emergency Medicine

## 2014-12-09 DIAGNOSIS — Z992 Dependence on renal dialysis: Secondary | ICD-10-CM | POA: Diagnosis not present

## 2014-12-09 DIAGNOSIS — N186 End stage renal disease: Secondary | ICD-10-CM | POA: Diagnosis not present

## 2014-12-09 DIAGNOSIS — F17210 Nicotine dependence, cigarettes, uncomplicated: Secondary | ICD-10-CM | POA: Diagnosis not present

## 2014-12-09 DIAGNOSIS — I120 Hypertensive chronic kidney disease with stage 5 chronic kidney disease or end stage renal disease: Secondary | ICD-10-CM | POA: Diagnosis not present

## 2014-12-09 LAB — RENAL FUNCTION PANEL
ALBUMIN: 2.6 g/dL — AB (ref 3.5–5.0)
Anion gap: 13 (ref 5–15)
BUN: 55 mg/dL — AB (ref 6–20)
CO2: 24 mmol/L (ref 22–32)
CREATININE: 10.71 mg/dL — AB (ref 0.44–1.00)
Calcium: 8.5 mg/dL — ABNORMAL LOW (ref 8.9–10.3)
Chloride: 99 mmol/L — ABNORMAL LOW (ref 101–111)
GFR calc Af Amer: 5 mL/min — ABNORMAL LOW (ref >60)
GFR, EST NON AFRICAN AMERICAN: 4 mL/min — AB (ref >60)
GLUCOSE: 107 mg/dL — AB (ref 65–99)
PHOSPHORUS: 8.1 mg/dL — AB (ref 2.5–4.6)
Potassium: 3.8 mmol/L (ref 3.5–5.1)
SODIUM: 136 mmol/L (ref 135–145)

## 2014-12-09 LAB — CBC
HCT: 25.8 % — ABNORMAL LOW (ref 36.0–46.0)
Hemoglobin: 8.5 g/dL — ABNORMAL LOW (ref 12.0–15.0)
MCH: 28 pg (ref 26.0–34.0)
MCHC: 32.9 g/dL (ref 30.0–36.0)
MCV: 84.9 fL (ref 78.0–100.0)
PLATELETS: 146 10*3/uL — AB (ref 150–400)
RBC: 3.04 MIL/uL — ABNORMAL LOW (ref 3.87–5.11)
RDW: 16.4 % — AB (ref 11.5–15.5)
WBC: 3.6 10*3/uL — AB (ref 4.0–10.5)

## 2014-12-09 MED ORDER — DARBEPOETIN ALFA 100 MCG/0.5ML IJ SOSY
100.0000 ug | PREFILLED_SYRINGE | INTRAMUSCULAR | Status: AC
  Administered 2014-12-09: 100 ug via INTRAVENOUS
  Filled 2014-12-09: qty 0.5

## 2014-12-09 MED ORDER — SODIUM CHLORIDE 0.9 % IV SOLN
125.0000 mg | INTRAVENOUS | Status: DC
  Administered 2014-12-09: 125 mg via INTRAVENOUS
  Filled 2014-12-09: qty 10

## 2014-12-09 MED ORDER — ACETAMINOPHEN 325 MG PO TABS
ORAL_TABLET | ORAL | Status: AC
  Filled 2014-12-09: qty 2

## 2014-12-09 MED ORDER — DARBEPOETIN ALFA 100 MCG/0.5ML IJ SOSY
PREFILLED_SYRINGE | INTRAMUSCULAR | Status: AC
  Filled 2014-12-09: qty 0.5

## 2014-12-09 NOTE — ED Notes (Signed)
Lunch ordered 

## 2014-12-09 NOTE — ED Notes (Signed)
I called dialysis to inquire about the time the patient could come up to have her HD done.  Dialysis is unsure of what time the patient will actually be able come and states it will at least be "much later."

## 2014-12-09 NOTE — ED Notes (Signed)
Pt's aunt, present, at bedside - appears upset. States she wants to know why pt has not been to dialysis yet. Per pt's consent, advised aunt dialysis is aware of pt and will be performing dialysis later today. Aunt advised she wants pt to go now d/t waiting so long. Pt on phone w/dialysis - states they advised her will be later this evening. RN advised aunt of procedure for pt's who come through the ED for dialysis. Aunt not happy w/explanation - suggested for aunt to speak w/someone in dialysis so they may explain procedure and reasoning. Aunt thanked Therapist, sports and pt is calling dialysis.

## 2014-12-09 NOTE — ED Notes (Signed)
Pt being transported via stretcher to dialysis.

## 2014-12-09 NOTE — ED Notes (Signed)
Pt pressed call bell to ask what time it is. Pt then called dialysis to inquire as to how much longer it will be.

## 2014-12-09 NOTE — ED Notes (Signed)
Needs dialysis, last tx Thursday, ran 3 hours and stopped then because she was cramping then.  Hypertensive, did not take meds this am because she is going to dialysis.
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
## 2014-12-09 NOTE — Care Management (Signed)
Patient presents to [facility] ED for HD, patient still without a outpatient HD center due being discharged from HD center.  Referrals were faxed out to several HD centers, patient has been declined by each of the HD centers, as per [facility] in HD.

## 2014-12-12 ENCOUNTER — Non-Acute Institutional Stay (HOSPITAL_COMMUNITY)
Admission: EM | Admit: 2014-12-12 | Discharge: 2014-12-12 | Discharge status: Against Medical Advice | Payer: Medicare Other | Attending: Emergency Medicine | Admitting: Emergency Medicine

## 2014-12-12 ENCOUNTER — Encounter (HOSPITAL_COMMUNITY): Payer: Self-pay | Admitting: Emergency Medicine

## 2014-12-12 ENCOUNTER — Emergency Department (HOSPITAL_COMMUNITY)
Admission: EM | Admit: 2014-12-12 | Discharge: 2014-12-12 | Discharge status: Against Medical Advice | Payer: Medicare Other | Attending: Emergency Medicine | Admitting: Emergency Medicine

## 2014-12-12 DIAGNOSIS — N186 End stage renal disease: Secondary | ICD-10-CM | POA: Diagnosis present

## 2014-12-12 DIAGNOSIS — I120 Hypertensive chronic kidney disease with stage 5 chronic kidney disease or end stage renal disease: Secondary | ICD-10-CM | POA: Insufficient documentation

## 2014-12-12 DIAGNOSIS — T7840XA Allergy, unspecified, initial encounter: Secondary | ICD-10-CM | POA: Insufficient documentation

## 2014-12-12 DIAGNOSIS — Z992 Dependence on renal dialysis: Secondary | ICD-10-CM | POA: Insufficient documentation

## 2014-12-12 DIAGNOSIS — Z720 Tobacco use: Secondary | ICD-10-CM | POA: Diagnosis not present

## 2014-12-12 MED ORDER — ACETAMINOPHEN 325 MG PO TABS
ORAL_TABLET | ORAL | Status: AC
  Filled 2014-12-12: qty 2

## 2014-12-12 MED ORDER — ACETAMINOPHEN 325 MG PO TABS
650.0000 mg | ORAL_TABLET | Freq: Once | ORAL | Status: AC
Start: 2014-12-12 — End: 2014-12-12
  Administered 2014-12-12: 650 mg via ORAL

## 2014-12-12 NOTE — ED Notes (Signed)
Pt here for dialysis and sts last dialysis was Saturday; pt sts thinks she is having reaction to one of her htn meds and has stopped taking it; pt sts some wounds with yellow drainage to arms and legs

## 2014-12-12 NOTE — ED Notes (Signed)
Patient here from dialysis to be seen for her allergic reaction

## 2014-12-12 NOTE — ED Notes (Signed)
Called no answer

## 2014-12-12 NOTE — ED Notes (Signed)
No answer when called times 2

## 2014-12-12 NOTE — ED Notes (Signed)
Patient came to nurse first to advise she just talked with dialysis and they will not be able to get to her until after 6pm.  Pt will return at 4pm.  Nurse called dialysis, per Bethena Roys pt will be put back on schedule at that time.

## 2014-12-13 ENCOUNTER — Ambulatory Visit (INDEPENDENT_AMBULATORY_CARE_PROVIDER_SITE_OTHER): Payer: Medicare Other | Admitting: Family Medicine

## 2014-12-13 ENCOUNTER — Encounter: Payer: Self-pay | Admitting: Family Medicine

## 2014-12-13 VITALS — BP 150/102 | HR 113 | Temp 97.6°F | Wt 151.0 lb

## 2014-12-13 DIAGNOSIS — R21 Rash and other nonspecific skin eruption: Secondary | ICD-10-CM | POA: Diagnosis not present

## 2014-12-13 MED ORDER — TRIAMCINOLONE ACETONIDE 0.025 % EX OINT: 1.0000 "application " | TOPICAL_OINTMENT | Freq: Two times a day (BID) | CUTANEOUS | Status: DC

## 2014-12-13 NOTE — Progress Notes (Signed)
   Subjective:   Sara [name] is a 34 y.o. female with a history of ESRD, bipolar disorder here for rash.  RASH  Had rash intermittently x2 months. Location: arms, legs, chest, vagina - "everywhere" Medications tried: none Similar rash in past: no New medications or antibiotics: thinks it is a medication side effect of hydralazine (started 6-12 months ago and off for the last 1 month) Tick, Insect or new pet exposure: denies Recent travel: denies New detergent or soap: denies Immunocompromised: no  Symptoms Itching: yes Pain over rash: yes Feeling ill all over: no Fever: no Mouth sores: thinks there may be one starting Face or tongue swelling: no Trouble breathing: no Joint swelling or pain: no  Initally look like pimples, was popping them and clear fluid came out, leave dark spots when resolved  Was seen at [facility] 11/16/14 by Dr. Ardelia Mems for same problem and offered multiple therapies, including steroid cream, skin biopsy, and referral to dermatology and patient refused all intervention.  Patient uses foul language when asked questions.  Yells and appears agitated when asked if she has any bug exposures, because "how dare you say I have bed bugs."  Patient becomes increasingly agitated during visit, stating that "there is clearly an infection throughout my body."  Discussed that there is no evidence of infection.  She then perseverates on medication side effects of hydralazine.  I explained multiple times that there should be no evidence of drug reaction from hydralazine if she has not taken it in 1 month.  She then requested to discuss shoulder pain and galactorrhea.  Explained that this is same day clinic and we have already discussed her rash, which was the issue she wished to discuss.  She yelled and used foul language, stating that she does not understand why she keeps going to the doctor for the same problem only to be told the same thing every time.  Review of Symptoms  - see HPI PMH - Smoking status noted.     Objective:  BP 150/102 mmHg  Pulse 113  Temp(Src) 97.6 F (36.4 C) (Oral)  Wt 151 lb (68.493 kg)  Gen:  34 y.o. female in NAD HEENT: NCAT, MMM, no oral lesions  Resp: Non-labored, CTAB, no wheezes noted Abd: Soft, NTND, BS present, no guarding or organomegaly Skin: Scattered scarred papules on arms, abdomen, face. Varying stages of healing. Excoriations around multiple papules. No erythema, no fluctuance or induration, no purulent material expressed. Gyn: Patient refused exam Neuro: Alert and oriented, speech normal Psych: Agitated with abrasive affect. Does not appear to be responding to internal stimuli     Assessment & Plan:     Sara [name] is a 34 y.o. female here for rash.  Rash and nonspecific skin eruption Rash likely secondary to patient picking at her skin No evidence of infection Palms and soles spared, therefore unlikely secondary syphilis Advised patient to clean skin well and moisturize after every shower Triamcinolone cream for itching Referral to dermatology - patient has appointment scheduled for 2 weeks from now See note in HPI about patient's reactions during visit    Virginia Crews, MD MPH PGY-2,  [facility] 12/13/2014  3:15 PM

## 2014-12-13 NOTE — Patient Instructions (Signed)
Nice to meet you today. You can use triamcinolone cream on the bumps on her arms and legs for itching. Do not use this on your face. I'll refer you to dermatology.  Take care,  Dr. B  Rash A rash is a change in the color or texture of your skin. There are many different types of rashes. You may have other problems that accompany your rash. CAUSES   Infections.  Allergic reactions. This can include allergies to pets or foods.  Certain medicines.  Exposure to certain chemicals, soaps, or cosmetics.  Heat.  Exposure to poisonous plants.  Tumors, both cancerous and noncancerous. SYMPTOMS   Redness.  Scaly skin.  Itchy skin.  Dry or cracked skin.  Bumps.  Blisters.  Pain. DIAGNOSIS  Your caregiver may do a physical exam to determine what type of rash you have. A skin sample (biopsy) may be taken and examined under a microscope. TREATMENT  Treatment depends on the type of rash you have. Your caregiver may prescribe certain medicines. For serious conditions, you may need to see a skin doctor (dermatologist). HOME CARE INSTRUCTIONS   Avoid the substance that caused your rash.  Do not scratch your rash. This can cause infection.  You may take cool baths to help stop itching.  Only take over-the-counter or prescription medicines as directed by your caregiver.  Keep all follow-up appointments as directed by your caregiver. SEEK IMMEDIATE MEDICAL CARE IF:  You have increasing pain, swelling, or redness.  You have a fever.  You have new or severe symptoms.  You have body aches, diarrhea, or vomiting.  Your rash is not better after 3 days. MAKE SURE YOU:  Understand these instructions.  Will watch your condition.  Will get help right away if you are not doing well or get worse. Document Released: 03/14/2002 Document Revised: 06/16/2011 Document Reviewed: 01/06/2011 [facility] Patient Information 2015 [place], [place]. This information is not intended to replace  advice given to you by your health care provider. Make sure you discuss any questions you have with your health care provider.

## 2014-12-13 NOTE — Assessment & Plan Note (Signed)
Rash likely secondary to patient picking at her skin No evidence of infection Palms and soles spared, therefore unlikely secondary syphilis Advised patient to clean skin well and moisturize after every shower Triamcinolone cream for itching Referral to dermatology - patient has appointment scheduled for 2 weeks from now See note in HPI about patient's reactions during visit

## 2014-12-14 ENCOUNTER — Emergency Department (HOSPITAL_COMMUNITY)
Admission: EM | Admit: 2014-12-14 | Discharge: 2014-12-14 | Disposition: A | Discharge status: Home / Self Care | Payer: Medicare Other | Attending: Emergency Medicine | Admitting: Emergency Medicine

## 2014-12-14 ENCOUNTER — Encounter (HOSPITAL_COMMUNITY): Payer: Self-pay

## 2014-12-14 DIAGNOSIS — Z720 Tobacco use: Secondary | ICD-10-CM | POA: Diagnosis not present

## 2014-12-14 DIAGNOSIS — Z79899 Other long term (current) drug therapy: Secondary | ICD-10-CM | POA: Insufficient documentation

## 2014-12-14 DIAGNOSIS — Z7952 Long term (current) use of systemic steroids: Secondary | ICD-10-CM | POA: Insufficient documentation

## 2014-12-14 DIAGNOSIS — N186 End stage renal disease: Secondary | ICD-10-CM | POA: Diagnosis not present

## 2014-12-14 DIAGNOSIS — I120 Hypertensive chronic kidney disease with stage 5 chronic kidney disease or end stage renal disease: Secondary | ICD-10-CM | POA: Diagnosis not present

## 2014-12-14 DIAGNOSIS — Z8739 Personal history of other diseases of the musculoskeletal system and connective tissue: Secondary | ICD-10-CM | POA: Insufficient documentation

## 2014-12-14 DIAGNOSIS — Z4932 Encounter for adequacy testing for peritoneal dialysis: Secondary | ICD-10-CM | POA: Insufficient documentation

## 2014-12-14 DIAGNOSIS — H02843 Edema of right eye, unspecified eyelid: Secondary | ICD-10-CM | POA: Insufficient documentation

## 2014-12-14 DIAGNOSIS — Z862 Personal history of diseases of the blood and blood-forming organs and certain disorders involving the immune mechanism: Secondary | ICD-10-CM | POA: Diagnosis not present

## 2014-12-14 DIAGNOSIS — F17210 Nicotine dependence, cigarettes, uncomplicated: Secondary | ICD-10-CM | POA: Diagnosis not present

## 2014-12-14 DIAGNOSIS — R2243 Localized swelling, mass and lump, lower limb, bilateral: Secondary | ICD-10-CM | POA: Diagnosis not present

## 2014-12-14 DIAGNOSIS — I509 Heart failure, unspecified: Secondary | ICD-10-CM | POA: Insufficient documentation

## 2014-12-14 DIAGNOSIS — F29 Unspecified psychosis not due to a substance or known physiological condition: Secondary | ICD-10-CM | POA: Diagnosis not present

## 2014-12-14 DIAGNOSIS — H02846 Edema of left eye, unspecified eyelid: Secondary | ICD-10-CM | POA: Insufficient documentation

## 2014-12-14 DIAGNOSIS — Z992 Dependence on renal dialysis: Secondary | ICD-10-CM | POA: Diagnosis not present

## 2014-12-14 DIAGNOSIS — R000 Tachycardia, unspecified: Secondary | ICD-10-CM | POA: Diagnosis not present

## 2014-12-14 LAB — I-STAT CHEM 8, ED
BUN: 61 mg/dL — AB (ref 6–20)
CHLORIDE: 101 mmol/L (ref 101–111)
Calcium, Ion: 1.16 mmol/L (ref 1.12–1.23)
Creatinine, Ser: 11.1 mg/dL — ABNORMAL HIGH (ref 0.44–1.00)
Glucose, Bld: 119 mg/dL — ABNORMAL HIGH (ref 65–99)
HEMATOCRIT: 27 % — AB (ref 36.0–46.0)
Hemoglobin: 9.2 g/dL — ABNORMAL LOW (ref 12.0–15.0)
POTASSIUM: 5 mmol/L (ref 3.5–5.1)
SODIUM: 136 mmol/L (ref 135–145)
TCO2: 24 mmol/L (ref 0–100)

## 2014-12-14 MED ORDER — SODIUM POLYSTYRENE SULFONATE 15 GM/60ML PO SUSP
30.0000 g | Freq: Once | ORAL | Status: AC
  Administered 2014-12-14: 30 g via ORAL
  Filled 2014-12-14: qty 120

## 2014-12-14 NOTE — ED Notes (Signed)
Pt presents for HD today, last treatment was Tuesday but only for 2.5 hours.

## 2014-12-14 NOTE — ED Notes (Signed)
Pt verbalizes understanding of needing to return tomorrow for dialysis treatment. VSS.

## 2014-12-14 NOTE — Discharge Instructions (Signed)
End-Stage Kidney Disease °The kidneys are two organs that lie on either side of the spine between the middle of the back and the front of the abdomen. The kidneys:  °· Remove wastes and extra water from the blood.   °· Produce important hormones. These help keep bones strong, regulate blood pressure, and help create red blood cells.   °· Balance the fluids and chemicals in the blood and tissues. °End-stage kidney disease occurs when the kidneys are so damaged that they cannot do their job. When the kidneys cannot do their job, life-threatening problems occur. The body cannot stay clean and strong without the help of the kidneys. In end-stage kidney disease, the kidneys cannot get better. You need a new kidney or treatments to do some of the work healthy kidneys do in order to stay alive. °CAUSES  °End-stage kidney disease usually occurs when a long-lasting (chronic) kidney disease gets worse. It may also occur after the kidneys are suddenly damaged (acute kidney injury).  °SYMPTOMS  °· Swelling (edema) of the legs, ankles, or feet.   °· Tiredness (lethargy).   °· Nausea or vomiting.   °· Confusion.   °· Problems with urination, such as:   °¨ Decreased urine production.   °¨ Frequent urination, especially at night.   °¨ Frequent accidents in children who are potty trained.   °· Muscle twitches and cramps.   °· Persistent itchiness.   °· Loss of appetite.   °· Headaches.   °· Abnormally dark or light skin.   °· Numbness in the hands or feet.   °· Easy bruising.   °· Frequent hiccups.   °· Menstruation stops. °DIAGNOSIS  °Your health care provider will measure your blood pressure and take some tests. These may include:  °· Urine tests.   °· Blood tests.   °· Imaging tests, such as:   °¨ An ultrasound exam.   °¨ Computed tomography (CT). °· A kidney biopsy. °TREATMENT  °There are two treatments for end-stage kidney disease:  °· A procedure that removes toxic wastes from the body (dialysis).   °· Receiving a new kidney  (kidney transplant). °Both of these treatments have serious risks and consequences. Your health care provider will help you determine which treatment is best for you based on your health, age, and other factors. In addition to having dialysis or a kidney transplant, you may need to take medicines to control high blood pressure (hypertension) and cholesterol and to decrease phosphorus levels in your blood.  °HOME CARE INSTRUCTIONS °· Follow your prescribed diet.   °· Take medicines only as directed by your health care provider.   °· Do not take any new medicines (prescription, over-the-counter, or nutritional supplements) unless approved by your health care provider. Many medicines can worsen your kidney damage or need to have the dose adjusted.   °· Keep all follow-up visits as directed by your health care provider. °MAKE SURE YOU: °· Understand these instructions. °· Will watch your condition. °· Will get help right away if you are not doing well or get worse. °Document Released: 06/14/2003 Document Revised: 08/08/2013 Document Reviewed: 11/21/2011 °ExitCare® Patient Information ©2015 [employer]. This information is not intended to replace advice given to you by your health care provider. Make sure you discuss any questions you have with your health care provider. ° °
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
## 2014-12-16 ENCOUNTER — Encounter (HOSPITAL_COMMUNITY): Payer: Self-pay | Admitting: *Deleted

## 2014-12-16 ENCOUNTER — Non-Acute Institutional Stay (HOSPITAL_COMMUNITY)
Admission: EM | Admit: 2014-12-16 | Discharge: 2014-12-16 | Disposition: A | Discharge status: Home / Self Care | Payer: Medicare Other | Attending: Emergency Medicine | Admitting: Emergency Medicine

## 2014-12-16 DIAGNOSIS — N186 End stage renal disease: Secondary | ICD-10-CM | POA: Diagnosis not present

## 2014-12-16 DIAGNOSIS — R600 Localized edema: Secondary | ICD-10-CM | POA: Diagnosis not present

## 2014-12-16 DIAGNOSIS — M329 Systemic lupus erythematosus, unspecified: Secondary | ICD-10-CM | POA: Diagnosis not present

## 2014-12-16 DIAGNOSIS — Z992 Dependence on renal dialysis: Secondary | ICD-10-CM | POA: Insufficient documentation

## 2014-12-16 DIAGNOSIS — I5022 Chronic systolic (congestive) heart failure: Secondary | ICD-10-CM | POA: Insufficient documentation

## 2014-12-16 DIAGNOSIS — F17210 Nicotine dependence, cigarettes, uncomplicated: Secondary | ICD-10-CM | POA: Diagnosis not present

## 2014-12-16 DIAGNOSIS — I120 Hypertensive chronic kidney disease with stage 5 chronic kidney disease or end stage renal disease: Secondary | ICD-10-CM | POA: Insufficient documentation

## 2014-12-16 DIAGNOSIS — R000 Tachycardia, unspecified: Secondary | ICD-10-CM | POA: Diagnosis not present

## 2014-12-16 LAB — RENAL FUNCTION PANEL
Albumin: 2.5 g/dL — ABNORMAL LOW (ref 3.5–5.0)
Anion gap: 18 — ABNORMAL HIGH (ref 5–15)
BUN: 87 mg/dL — AB (ref 6–20)
CALCIUM: 8.9 mg/dL (ref 8.9–10.3)
CHLORIDE: 99 mmol/L — AB (ref 101–111)
CO2: 19 mmol/L — AB (ref 22–32)
CREATININE: 14.09 mg/dL — AB (ref 0.44–1.00)
GFR calc non Af Amer: 3 mL/min — ABNORMAL LOW (ref >60)
GFR, EST AFRICAN AMERICAN: 3 mL/min — AB (ref >60)
Glucose, Bld: 98 mg/dL (ref 65–99)
Phosphorus: 12.3 mg/dL — ABNORMAL HIGH (ref 2.5–4.6)
Potassium: 5.1 mmol/L (ref 3.5–5.1)
SODIUM: 136 mmol/L (ref 135–145)

## 2014-12-16 LAB — CBC
HCT: 25.1 % — ABNORMAL LOW (ref 36.0–46.0)
Hemoglobin: 8 g/dL — ABNORMAL LOW (ref 12.0–15.0)
MCH: 27.6 pg (ref 26.0–34.0)
MCHC: 31.9 g/dL (ref 30.0–36.0)
MCV: 86.6 fL (ref 78.0–100.0)
PLATELETS: 211 10*3/uL (ref 150–400)
RBC: 2.9 MIL/uL — AB (ref 3.87–5.11)
RDW: 17.3 % — AB (ref 11.5–15.5)
WBC: 4.5 10*3/uL (ref 4.0–10.5)

## 2014-12-16 MED ORDER — ACETAMINOPHEN 325 MG PO TABS: 325.0000 mg | ORAL_TABLET | Freq: Four times a day (QID) | ORAL | Status: DC | PRN

## 2014-12-16 MED ORDER — PENTAFLUOROPROP-TETRAFLUOROETH EX AERO: 1.0000 "application " | INHALATION_SPRAY | CUTANEOUS | Status: DC | PRN

## 2014-12-16 MED ORDER — SODIUM CHLORIDE 0.9 % IV SOLN: 100.0000 mL | INTRAVENOUS | Status: DC | PRN

## 2014-12-16 MED ORDER — HEPARIN SODIUM (PORCINE) 1000 UNIT/ML DIALYSIS: 1000.0000 [IU] | INTRAMUSCULAR | Status: DC | PRN

## 2014-12-16 MED ORDER — ACETAMINOPHEN 325 MG PO TABS
ORAL_TABLET | ORAL | Status: AC
  Administered 2014-12-16: 650 mg via ORAL
  Filled 2014-12-16: qty 2

## 2014-12-16 MED ORDER — ACETAMINOPHEN 325 MG PO TABS
650.0000 mg | ORAL_TABLET | Freq: Four times a day (QID) | ORAL | Status: DC | PRN
  Administered 2014-12-16: 650 mg via ORAL

## 2014-12-16 MED ORDER — LIDOCAINE HCL (PF) 1 % IJ SOLN: 5.0000 mL | INTRAMUSCULAR | Status: DC | PRN

## 2014-12-16 MED ORDER — DIPHENHYDRAMINE HCL 25 MG PO CAPS
25.0000 mg | ORAL_CAPSULE | Freq: Once | ORAL | Status: AC
  Administered 2014-12-16: 25 mg via ORAL

## 2014-12-16 MED ORDER — DIPHENHYDRAMINE HCL 25 MG PO CAPS
ORAL_CAPSULE | ORAL | Status: AC
  Administered 2014-12-16: 25 mg via ORAL
  Filled 2014-12-16: qty 1

## 2014-12-16 MED ORDER — ALTEPLASE 2 MG IJ SOLR
2.0000 mg | Freq: Once | INTRAMUSCULAR | Status: DC | PRN
  Filled 2014-12-16: qty 2

## 2014-12-16 MED ORDER — LIDOCAINE-PRILOCAINE 2.5-2.5 % EX CREA
1.0000 "application " | TOPICAL_CREAM | CUTANEOUS | Status: DC | PRN
  Filled 2014-12-16: qty 5
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
## 2014-12-16 NOTE — ED Notes (Signed)
Pt here for dialysis, reports her last treatment was Tuesday and having increase in weight. Pt appears in no distress at triage, is eating breakfast. Airway intact.

## 2014-12-16 NOTE — ED Notes (Signed)
Pt here for routine dialysis. Refusing to be placed on cardiac monitior. Requesting meal tray to be ordered, informed pt needs to see MD before ordering her food.

## 2014-12-17 NOTE — Procedures (Addendum)
Presented for dialysis, got 5 kg off and cramping prevented further UF.    Date  Hb  tsat  Darbe  Fe       9-10  8.0 9-8  9.2 9-3  8.5   100 ug 9-1  8.6 8-30  9.3  8-28  8.5 8-25  9.5   8-22  10.9   - 8-6  9.8   60 ug 7-25  8.8 23%  200      7-19  8.3   200 7-15     200 7-11 7-9  6.9 31%    feraheme 510mg   7-4     200 6-25  6.8 7%       I was present at this dialysis session, have reviewed the session itself and made  appropriate changes Kelly Splinter MD (pgr) [phone]    [phone] 12/16/2014   10:20 PM

## 2014-12-18 ENCOUNTER — Emergency Department (HOSPITAL_COMMUNITY)
Admission: EM | Admit: 2014-12-18 | Discharge: 2014-12-18 | Disposition: A | Discharge status: Home / Self Care | Payer: Medicare Other | Attending: Emergency Medicine | Admitting: Emergency Medicine

## 2014-12-18 ENCOUNTER — Encounter (HOSPITAL_COMMUNITY): Payer: Self-pay | Admitting: *Deleted

## 2014-12-18 DIAGNOSIS — Z8739 Personal history of other diseases of the musculoskeletal system and connective tissue: Secondary | ICD-10-CM | POA: Insufficient documentation

## 2014-12-18 DIAGNOSIS — F209 Schizophrenia, unspecified: Secondary | ICD-10-CM | POA: Diagnosis not present

## 2014-12-18 DIAGNOSIS — I509 Heart failure, unspecified: Secondary | ICD-10-CM | POA: Diagnosis not present

## 2014-12-18 DIAGNOSIS — Z79899 Other long term (current) drug therapy: Secondary | ICD-10-CM | POA: Diagnosis not present

## 2014-12-18 DIAGNOSIS — Z992 Dependence on renal dialysis: Secondary | ICD-10-CM | POA: Diagnosis not present

## 2014-12-18 DIAGNOSIS — N186 End stage renal disease: Secondary | ICD-10-CM

## 2014-12-18 DIAGNOSIS — I120 Hypertensive chronic kidney disease with stage 5 chronic kidney disease or end stage renal disease: Secondary | ICD-10-CM | POA: Insufficient documentation

## 2014-12-18 DIAGNOSIS — M7989 Other specified soft tissue disorders: Secondary | ICD-10-CM | POA: Diagnosis present

## 2014-12-18 DIAGNOSIS — Z720 Tobacco use: Secondary | ICD-10-CM | POA: Diagnosis not present

## 2014-12-18 DIAGNOSIS — Z862 Personal history of diseases of the blood and blood-forming organs and certain disorders involving the immune mechanism: Secondary | ICD-10-CM | POA: Diagnosis not present

## 2014-12-18 LAB — RENAL FUNCTION PANEL
ALBUMIN: 2.8 g/dL — AB (ref 3.5–5.0)
Anion gap: 17 — ABNORMAL HIGH (ref 5–15)
BUN: 76 mg/dL — AB (ref 6–20)
CALCIUM: 9 mg/dL (ref 8.9–10.3)
CHLORIDE: 99 mmol/L — AB (ref 101–111)
CO2: 21 mmol/L — ABNORMAL LOW (ref 22–32)
CREATININE: 12.17 mg/dL — AB (ref 0.44–1.00)
GFR, EST AFRICAN AMERICAN: 4 mL/min — AB (ref >60)
GFR, EST NON AFRICAN AMERICAN: 4 mL/min — AB (ref >60)
Glucose, Bld: 85 mg/dL (ref 65–99)
PHOSPHORUS: 11.3 mg/dL — AB (ref 2.5–4.6)
Potassium: 4.8 mmol/L (ref 3.5–5.1)
SODIUM: 137 mmol/L (ref 135–145)

## 2014-12-18 LAB — CBC
HCT: 25.6 % — ABNORMAL LOW (ref 36.0–46.0)
Hemoglobin: 8.4 g/dL — ABNORMAL LOW (ref 12.0–15.0)
MCH: 28.6 pg (ref 26.0–34.0)
MCHC: 32.8 g/dL (ref 30.0–36.0)
MCV: 87.1 fL (ref 78.0–100.0)
PLATELETS: 206 10*3/uL (ref 150–400)
RBC: 2.94 MIL/uL — AB (ref 3.87–5.11)
RDW: 17.3 % — AB (ref 11.5–15.5)
WBC: 4.7 10*3/uL (ref 4.0–10.5)

## 2014-12-18 MED ORDER — LIDOCAINE HCL (PF) 1 % IJ SOLN: 5.0000 mL | INTRAMUSCULAR | Status: DC | PRN

## 2014-12-18 MED ORDER — SODIUM CHLORIDE 0.9 % IV SOLN: 100.0000 mL | INTRAVENOUS | Status: DC | PRN

## 2014-12-18 MED ORDER — ACETAMINOPHEN 325 MG PO TABS
ORAL_TABLET | ORAL | Status: AC
  Administered 2014-12-18: 20:00:00
  Filled 2014-12-18: qty 2

## 2014-12-18 MED ORDER — DARBEPOETIN ALFA 200 MCG/0.4ML IJ SOSY
PREFILLED_SYRINGE | INTRAMUSCULAR | Status: AC
  Administered 2014-12-18: 200 ug via INTRAVENOUS
  Filled 2014-12-18: qty 0.4

## 2014-12-18 MED ORDER — SODIUM CHLORIDE 0.9 % IV SOLN
125.0000 mg | Freq: Once | INTRAVENOUS | Status: AC
  Administered 2014-12-18: 125 mg via INTRAVENOUS
  Filled 2014-12-18: qty 10

## 2014-12-18 MED ORDER — LIDOCAINE-PRILOCAINE 2.5-2.5 % EX CREA: 1.0000 "application " | TOPICAL_CREAM | CUTANEOUS | Status: DC | PRN

## 2014-12-18 MED ORDER — HYDROXYZINE HCL 25 MG PO TABS
ORAL_TABLET | ORAL | Status: AC
  Administered 2014-12-18: 25 mg via ORAL
  Filled 2014-12-18: qty 1

## 2014-12-18 MED ORDER — PENTAFLUOROPROP-TETRAFLUOROETH EX AERO: 1.0000 "application " | INHALATION_SPRAY | CUTANEOUS | Status: DC | PRN

## 2014-12-18 MED ORDER — ALTEPLASE 2 MG IJ SOLR
2.0000 mg | Freq: Once | INTRAMUSCULAR | Status: DC | PRN
  Filled 2014-12-18: qty 2

## 2014-12-18 MED ORDER — DARBEPOETIN ALFA 200 MCG/0.4ML IJ SOSY
200.0000 ug | PREFILLED_SYRINGE | Freq: Once | INTRAMUSCULAR | Status: AC
  Administered 2014-12-18: 200 ug via INTRAVENOUS

## 2014-12-18 MED ORDER — HEPARIN SODIUM (PORCINE) 1000 UNIT/ML DIALYSIS: 20.0000 [IU]/kg | INTRAMUSCULAR | Status: DC | PRN

## 2014-12-18 MED ORDER — HEPARIN SODIUM (PORCINE) 1000 UNIT/ML DIALYSIS: 1000.0000 [IU] | INTRAMUSCULAR | Status: DC | PRN
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
## 2014-12-18 NOTE — Progress Notes (Signed)
Pt in the ED in need of dialysis Last labs 9/10 K 5.1 Hb 8 Will get pre HD labs Give 200 of Aranesp and 125 ferrlecit with HD tonight 2K bath pending labs 4 hour treatment (if she will stay on HD), 5 liters off Home after HD (and back to the ED if issues arise that require admission)  Jamal Maes, MD [place] Pager 12/18/2014, 6:25 PM

## 2014-12-18 NOTE — ED Notes (Signed)
Dialysis nurse transported pt from ED.

## 2014-12-18 NOTE — ED Notes (Signed)
Pt reports needing dialysis, last tx was Saturday but missed thursdays tx. Pt reports still having swelling. States dialysis will be ready for her between 1800-1900.

## 2014-12-18 NOTE — ED Notes (Signed)
Pt states that she is stepping out to smoke prior to being triaged. Also states that she called dialysis and will be able to get dialysis before 6. Called dialysis, they state that pt will still need to be seen in the ED and go through normal procedure. Could not give an exact time for dialysis.

## 2014-12-19 ENCOUNTER — Emergency Department (HOSPITAL_COMMUNITY)
Admission: EM | Admit: 2014-12-19 | Discharge: 2014-12-19 | Discharge status: Absent without leave | Payer: Medicare Other

## 2014-12-19 NOTE — ED Notes (Signed)
Pt checked in then went outside ... Nurse first asked to call me when she returns so she can be triaged

## 2014-12-20 ENCOUNTER — Other Ambulatory Visit: Payer: Self-pay | Admitting: Dermatology

## 2014-12-20 ENCOUNTER — Encounter (HOSPITAL_COMMUNITY): Payer: Self-pay | Admitting: Emergency Medicine

## 2014-12-20 ENCOUNTER — Non-Acute Institutional Stay (HOSPITAL_COMMUNITY)
Admission: EM | Admit: 2014-12-20 | Discharge: 2014-12-20 | Disposition: A | Discharge status: Home / Self Care | Payer: Medicare Other | Attending: Emergency Medicine | Admitting: Emergency Medicine

## 2014-12-20 DIAGNOSIS — N186 End stage renal disease: Secondary | ICD-10-CM | POA: Diagnosis not present

## 2014-12-20 DIAGNOSIS — N189 Chronic kidney disease, unspecified: Secondary | ICD-10-CM | POA: Diagnosis not present

## 2014-12-20 DIAGNOSIS — D485 Neoplasm of uncertain behavior of skin: Secondary | ICD-10-CM | POA: Diagnosis not present

## 2014-12-20 DIAGNOSIS — Z79899 Other long term (current) drug therapy: Secondary | ICD-10-CM | POA: Diagnosis not present

## 2014-12-20 DIAGNOSIS — I120 Hypertensive chronic kidney disease with stage 5 chronic kidney disease or end stage renal disease: Secondary | ICD-10-CM | POA: Diagnosis not present

## 2014-12-20 DIAGNOSIS — D649 Anemia, unspecified: Secondary | ICD-10-CM | POA: Diagnosis not present

## 2014-12-20 DIAGNOSIS — R0602 Shortness of breath: Secondary | ICD-10-CM | POA: Diagnosis present

## 2014-12-20 DIAGNOSIS — L281 Prurigo nodularis: Secondary | ICD-10-CM | POA: Diagnosis not present

## 2014-12-20 DIAGNOSIS — F209 Schizophrenia, unspecified: Secondary | ICD-10-CM | POA: Insufficient documentation

## 2014-12-20 DIAGNOSIS — I509 Heart failure, unspecified: Secondary | ICD-10-CM | POA: Diagnosis not present

## 2014-12-20 DIAGNOSIS — R21 Rash and other nonspecific skin eruption: Secondary | ICD-10-CM | POA: Diagnosis not present

## 2014-12-20 DIAGNOSIS — F319 Bipolar disorder, unspecified: Secondary | ICD-10-CM | POA: Diagnosis not present

## 2014-12-20 DIAGNOSIS — Z992 Dependence on renal dialysis: Secondary | ICD-10-CM | POA: Diagnosis present

## 2014-12-20 DIAGNOSIS — F17210 Nicotine dependence, cigarettes, uncomplicated: Secondary | ICD-10-CM | POA: Diagnosis not present

## 2014-12-20 DIAGNOSIS — M3214 Glomerular disease in systemic lupus erythematosus: Secondary | ICD-10-CM | POA: Insufficient documentation

## 2014-12-20 DIAGNOSIS — L280 Lichen simplex chronicus: Secondary | ICD-10-CM | POA: Diagnosis not present

## 2014-12-20 MED ORDER — HEPARIN SODIUM (PORCINE) 1000 UNIT/ML DIALYSIS: 1000.0000 [IU] | INTRAMUSCULAR | Status: DC | PRN

## 2014-12-20 MED ORDER — HEPARIN SODIUM (PORCINE) 1000 UNIT/ML DIALYSIS: 20.0000 [IU]/kg | INTRAMUSCULAR | Status: DC | PRN

## 2014-12-20 MED ORDER — PENTAFLUOROPROP-TETRAFLUOROETH EX AERO
1.0000 | INHALATION_SPRAY | CUTANEOUS | Status: DC | PRN
Start: 2014-12-20 — End: 2014-12-21

## 2014-12-20 MED ORDER — LIDOCAINE-PRILOCAINE 2.5-2.5 % EX CREA: 1.0000 "application " | TOPICAL_CREAM | CUTANEOUS | Status: DC | PRN

## 2014-12-20 MED ORDER — LIDOCAINE HCL (PF) 1 % IJ SOLN: 5.0000 mL | INTRAMUSCULAR | Status: DC | PRN

## 2014-12-20 MED ORDER — DIPHENHYDRAMINE HCL 25 MG PO CAPS
25.0000 mg | ORAL_CAPSULE | Freq: Once | ORAL | Status: AC
  Administered 2014-12-20: 25 mg via ORAL
  Filled 2014-12-20: qty 1

## 2014-12-20 MED ORDER — SODIUM CHLORIDE 0.9 % IV SOLN: 100.0000 mL | INTRAVENOUS | Status: DC | PRN

## 2014-12-20 MED ORDER — ALTEPLASE 2 MG IJ SOLR
2.0000 mg | Freq: Once | INTRAMUSCULAR | Status: DC | PRN
  Filled 2014-12-20: qty 2

## 2014-12-20 NOTE — ED Notes (Signed)
To ed to be dialyzed, pt dry wt is 135#-- pt's wt today is 159.9#--- pedal edema positive

## 2014-12-20 NOTE — Progress Notes (Signed)
Patient completed scheduled hemodialysis. Patient's vital signs are stable. Lungs are clear. No shortness of breath reported.  AVF is positive for thrill and bruit. Patient request to be dialyzed 12/21/2014. Patient has been encouraged to call first in the am.

## 2014-12-20 NOTE — ED Notes (Signed)
Pt does not want to be put on monitor.
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
## 2014-12-22 ENCOUNTER — Encounter (HOSPITAL_COMMUNITY): Payer: Self-pay | Admitting: Emergency Medicine

## 2014-12-22 ENCOUNTER — Emergency Department (HOSPITAL_COMMUNITY)
Admission: EM | Admit: 2014-12-22 | Discharge: 2014-12-22 | Disposition: A | Discharge status: Home / Self Care | Payer: Medicare Other | Attending: Emergency Medicine | Admitting: Emergency Medicine

## 2014-12-22 DIAGNOSIS — Z720 Tobacco use: Secondary | ICD-10-CM | POA: Insufficient documentation

## 2014-12-22 DIAGNOSIS — N186 End stage renal disease: Secondary | ICD-10-CM | POA: Diagnosis not present

## 2014-12-22 DIAGNOSIS — Z79899 Other long term (current) drug therapy: Secondary | ICD-10-CM | POA: Diagnosis not present

## 2014-12-22 DIAGNOSIS — R05 Cough: Secondary | ICD-10-CM | POA: Insufficient documentation

## 2014-12-22 DIAGNOSIS — R2240 Localized swelling, mass and lump, unspecified lower limb: Secondary | ICD-10-CM | POA: Insufficient documentation

## 2014-12-22 DIAGNOSIS — R0602 Shortness of breath: Secondary | ICD-10-CM | POA: Insufficient documentation

## 2014-12-22 DIAGNOSIS — M545 Low back pain: Secondary | ICD-10-CM | POA: Insufficient documentation

## 2014-12-22 DIAGNOSIS — Z7952 Long term (current) use of systemic steroids: Secondary | ICD-10-CM | POA: Diagnosis not present

## 2014-12-22 DIAGNOSIS — N19 Unspecified kidney failure: Secondary | ICD-10-CM | POA: Diagnosis not present

## 2014-12-22 DIAGNOSIS — I509 Heart failure, unspecified: Secondary | ICD-10-CM | POA: Insufficient documentation

## 2014-12-22 DIAGNOSIS — Z862 Personal history of diseases of the blood and blood-forming organs and certain disorders involving the immune mechanism: Secondary | ICD-10-CM | POA: Diagnosis not present

## 2014-12-22 DIAGNOSIS — Z4931 Encounter for adequacy testing for hemodialysis: Secondary | ICD-10-CM | POA: Diagnosis not present

## 2014-12-22 DIAGNOSIS — F29 Unspecified psychosis not due to a substance or known physiological condition: Secondary | ICD-10-CM | POA: Insufficient documentation

## 2014-12-22 DIAGNOSIS — I120 Hypertensive chronic kidney disease with stage 5 chronic kidney disease or end stage renal disease: Secondary | ICD-10-CM | POA: Insufficient documentation

## 2014-12-22 MED ORDER — DIPHENHYDRAMINE HCL 25 MG PO CAPS
25.0000 mg | ORAL_CAPSULE | Freq: Once | ORAL | Status: AC
  Administered 2014-12-22: 25 mg via ORAL
  Filled 2014-12-22: qty 1

## 2014-12-22 MED ORDER — ACETAMINOPHEN 325 MG PO TABS
650.0000 mg | ORAL_TABLET | Freq: Once | ORAL | Status: AC
  Administered 2014-12-22: 650 mg via ORAL
  Filled 2014-12-22: qty 2

## 2014-12-22 NOTE — ED Notes (Signed)
Pt informed that dialysis would not occur until early morning. Pt requesting discharge to come back tomorrow. MD at bedside.

## 2014-12-22 NOTE — Discharge Instructions (Signed)
End-Stage Kidney Disease °The kidneys are two organs that lie on either side of the spine between the middle of the back and the front of the abdomen. The kidneys:  °· Remove wastes and extra water from the blood.   °· Produce important hormones. These help keep bones strong, regulate blood pressure, and help create red blood cells.   °· Balance the fluids and chemicals in the blood and tissues. °End-stage kidney disease occurs when the kidneys are so damaged that they cannot do their job. When the kidneys cannot do their job, life-threatening problems occur. The body cannot stay clean and strong without the help of the kidneys. In end-stage kidney disease, the kidneys cannot get better. You need a new kidney or treatments to do some of the work healthy kidneys do in order to stay alive. °CAUSES  °End-stage kidney disease usually occurs when a long-lasting (chronic) kidney disease gets worse. It may also occur after the kidneys are suddenly damaged (acute kidney injury).  °SYMPTOMS  °· Swelling (edema) of the legs, ankles, or feet.   °· Tiredness (lethargy).   °· Nausea or vomiting.   °· Confusion.   °· Problems with urination, such as:   °¨ Decreased urine production.   °¨ Frequent urination, especially at night.   °¨ Frequent accidents in children who are potty trained.   °· Muscle twitches and cramps.   °· Persistent itchiness.   °· Loss of appetite.   °· Headaches.   °· Abnormally dark or light skin.   °· Numbness in the hands or feet.   °· Easy bruising.   °· Frequent hiccups.   °· Menstruation stops. °DIAGNOSIS  °Your health care provider will measure your blood pressure and take some tests. These may include:  °· Urine tests.   °· Blood tests.   °· Imaging tests, such as:   °¨ An ultrasound exam.   °¨ Computed tomography (CT). °· A kidney biopsy. °TREATMENT  °There are two treatments for end-stage kidney disease:  °· A procedure that removes toxic wastes from the body (dialysis).   °· Receiving a new kidney  (kidney transplant). °Both of these treatments have serious risks and consequences. Your health care provider will help you determine which treatment is best for you based on your health, age, and other factors. In addition to having dialysis or a kidney transplant, you may need to take medicines to control high blood pressure (hypertension) and cholesterol and to decrease phosphorus levels in your blood.  °HOME CARE INSTRUCTIONS °· Follow your prescribed diet.   °· Take medicines only as directed by your health care provider.   °· Do not take any new medicines (prescription, over-the-counter, or nutritional supplements) unless approved by your health care provider. Many medicines can worsen your kidney damage or need to have the dose adjusted.   °· Keep all follow-up visits as directed by your health care provider. °MAKE SURE YOU: °· Understand these instructions. °· Will watch your condition. °· Will get help right away if you are not doing well or get worse. °Document Released: 06/14/2003 Document Revised: 08/08/2013 Document Reviewed: 11/21/2011 °ExitCare® Patient Information ©2015 [employer]. This information is not intended to replace advice given to you by your health care provider. Make sure you discuss any questions you have with your health care provider. ° °
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
## 2014-12-22 NOTE — ED Notes (Signed)
Pt states she has spoken with cori in dialysis and he stated a bay was ready.  Called dialysis and [facility] stated this was not the case - that they were not ready for her.

## 2014-12-22 NOTE — ED Notes (Signed)
MD at bedside. 

## 2014-12-22 NOTE — ED Notes (Signed)
Patient states she needs to get dialysis.

## 2014-12-22 NOTE — ED Provider Notes (Signed)
Pt now requesting d/c home She wants to come back tomorrow for dialysis Awake/alert, no distress She is ambulatory She ate a meal No hypoxia Will d/c home   Ripley Fraise, MD 12/22/14 2256

## 2014-12-22 NOTE — Progress Notes (Signed)
Hemodialysis- pt called unit x5 times throughout the day wanting to know when we could get her for dialysis. Explained to patient she must be seen in ED,renal consulted, and orders written before we can prepare for her. Also explained that even if we have an open bay we cannot dialyze her without orders. Awaiting orders currently. Pt stated she understands.

## 2014-12-23 ENCOUNTER — Encounter (HOSPITAL_COMMUNITY): Payer: Self-pay | Admitting: Emergency Medicine

## 2014-12-23 ENCOUNTER — Non-Acute Institutional Stay (HOSPITAL_COMMUNITY)
Admission: EM | Admit: 2014-12-23 | Discharge: 2014-12-23 | Disposition: A | Discharge status: Home / Self Care | Payer: Medicare Other | Attending: Emergency Medicine | Admitting: Emergency Medicine

## 2014-12-23 DIAGNOSIS — R0602 Shortness of breath: Secondary | ICD-10-CM | POA: Diagnosis not present

## 2014-12-23 DIAGNOSIS — I509 Heart failure, unspecified: Secondary | ICD-10-CM | POA: Diagnosis not present

## 2014-12-23 DIAGNOSIS — F17210 Nicotine dependence, cigarettes, uncomplicated: Secondary | ICD-10-CM | POA: Insufficient documentation

## 2014-12-23 DIAGNOSIS — Z992 Dependence on renal dialysis: Secondary | ICD-10-CM | POA: Insufficient documentation

## 2014-12-23 DIAGNOSIS — D649 Anemia, unspecified: Secondary | ICD-10-CM | POA: Diagnosis not present

## 2014-12-23 DIAGNOSIS — F319 Bipolar disorder, unspecified: Secondary | ICD-10-CM | POA: Diagnosis not present

## 2014-12-23 DIAGNOSIS — M3214 Glomerular disease in systemic lupus erythematosus: Secondary | ICD-10-CM | POA: Insufficient documentation

## 2014-12-23 DIAGNOSIS — Z79899 Other long term (current) drug therapy: Secondary | ICD-10-CM | POA: Diagnosis not present

## 2014-12-23 DIAGNOSIS — R21 Rash and other nonspecific skin eruption: Secondary | ICD-10-CM | POA: Diagnosis not present

## 2014-12-23 DIAGNOSIS — E8770 Fluid overload, unspecified: Secondary | ICD-10-CM | POA: Diagnosis present

## 2014-12-23 DIAGNOSIS — F209 Schizophrenia, unspecified: Secondary | ICD-10-CM | POA: Insufficient documentation

## 2014-12-23 DIAGNOSIS — N186 End stage renal disease: Secondary | ICD-10-CM | POA: Diagnosis not present

## 2014-12-23 DIAGNOSIS — I120 Hypertensive chronic kidney disease with stage 5 chronic kidney disease or end stage renal disease: Secondary | ICD-10-CM | POA: Insufficient documentation

## 2014-12-23 LAB — I-STAT CHEM 8, ED
BUN: 53 mg/dL — AB (ref 6–20)
CALCIUM ION: 1.14 mmol/L (ref 1.12–1.23)
CREATININE: 10.3 mg/dL — AB (ref 0.44–1.00)
Chloride: 101 mmol/L (ref 101–111)
GLUCOSE: 81 mg/dL (ref 65–99)
HEMATOCRIT: 29 % — AB (ref 36.0–46.0)
HEMOGLOBIN: 9.9 g/dL — AB (ref 12.0–15.0)
Potassium: 4.3 mmol/L (ref 3.5–5.1)
Sodium: 138 mmol/L (ref 135–145)
TCO2: 23 mmol/L (ref 0–100)

## 2014-12-23 MED ORDER — PENTAFLUOROPROP-TETRAFLUOROETH EX AERO
1.0000 | INHALATION_SPRAY | CUTANEOUS | Status: DC | PRN
Start: 2014-12-23 — End: 2014-12-24

## 2014-12-23 MED ORDER — HEPARIN SODIUM (PORCINE) 1000 UNIT/ML DIALYSIS
40.0000 [IU]/kg | INTRAMUSCULAR | Status: DC | PRN
  Administered 2014-12-23: 2800 [IU] via INTRAVENOUS_CENTRAL

## 2014-12-23 MED ORDER — LIDOCAINE HCL (PF) 1 % IJ SOLN: 5.0000 mL | INTRAMUSCULAR | Status: DC | PRN

## 2014-12-23 MED ORDER — LIDOCAINE-PRILOCAINE 2.5-2.5 % EX CREA: 1.0000 "application " | TOPICAL_CREAM | CUTANEOUS | Status: DC | PRN

## 2014-12-23 MED ORDER — ACETAMINOPHEN 325 MG PO TABS
650.0000 mg | ORAL_TABLET | Freq: Once | ORAL | Status: AC
  Administered 2014-12-23: 650 mg via ORAL
  Filled 2014-12-23: qty 2

## 2014-12-23 MED ORDER — SODIUM CHLORIDE 0.9 % IV SOLN: 100.0000 mL | INTRAVENOUS | Status: DC | PRN

## 2014-12-23 MED ORDER — ALTEPLASE 2 MG IJ SOLR: 2.0000 mg | Freq: Once | INTRAMUSCULAR | Status: DC | PRN

## 2014-12-23 MED ORDER — HEPARIN SODIUM (PORCINE) 1000 UNIT/ML DIALYSIS: 1000.0000 [IU] | INTRAMUSCULAR | Status: DC | PRN

## 2014-12-23 MED ORDER — DIPHENHYDRAMINE HCL 25 MG PO CAPS
25.0000 mg | ORAL_CAPSULE | Freq: Once | ORAL | Status: AC
  Administered 2014-12-23: 25 mg via ORAL
  Filled 2014-12-23: qty 1

## 2014-12-23 NOTE — ED Notes (Signed)
EDP at bedside  

## 2014-12-23 NOTE — ED Notes (Signed)
Pt dialysis schedule is T-R-Sat. Last dialysis on Wednesday 12-20-14. Pt was denied Tuesday and Thursday. NAD noted.

## 2014-12-23 NOTE — ED Notes (Signed)
Pt in bathroom
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
## 2014-12-26 ENCOUNTER — Inpatient Hospital Stay (HOSPITAL_COMMUNITY)
Admission: EM | Admit: 2014-12-26 | Discharge: 2014-12-28 | DRG: 682 | Disposition: A | Discharge status: Home / Self Care | Payer: Medicare Other | Attending: Internal Medicine | Admitting: Internal Medicine

## 2014-12-26 ENCOUNTER — Other Ambulatory Visit: Payer: Self-pay

## 2014-12-26 ENCOUNTER — Emergency Department (HOSPITAL_COMMUNITY): Payer: Medicare Other

## 2014-12-26 ENCOUNTER — Encounter: Payer: Self-pay | Admitting: Family Medicine

## 2014-12-26 ENCOUNTER — Encounter (HOSPITAL_COMMUNITY): Payer: Self-pay | Admitting: Emergency Medicine

## 2014-12-26 ENCOUNTER — Ambulatory Visit (INDEPENDENT_AMBULATORY_CARE_PROVIDER_SITE_OTHER): Payer: Medicare Other | Admitting: Family Medicine

## 2014-12-26 VITALS — BP 158/114 | HR 107 | Temp 97.8°F | Wt 148.0 lb

## 2014-12-26 DIAGNOSIS — D631 Anemia in chronic kidney disease: Secondary | ICD-10-CM | POA: Diagnosis present

## 2014-12-26 DIAGNOSIS — J811 Chronic pulmonary edema: Secondary | ICD-10-CM | POA: Diagnosis present

## 2014-12-26 DIAGNOSIS — Z91048 Other nonmedicinal substance allergy status: Secondary | ICD-10-CM | POA: Diagnosis not present

## 2014-12-26 DIAGNOSIS — R392 Extrarenal uremia: Secondary | ICD-10-CM | POA: Diagnosis not present

## 2014-12-26 DIAGNOSIS — I120 Hypertensive chronic kidney disease with stage 5 chronic kidney disease or end stage renal disease: Principal | ICD-10-CM | POA: Diagnosis present

## 2014-12-26 DIAGNOSIS — Z79899 Other long term (current) drug therapy: Secondary | ICD-10-CM | POA: Diagnosis not present

## 2014-12-26 DIAGNOSIS — R05 Cough: Secondary | ICD-10-CM

## 2014-12-26 DIAGNOSIS — N186 End stage renal disease: Secondary | ICD-10-CM | POA: Diagnosis not present

## 2014-12-26 DIAGNOSIS — Z881 Allergy status to other antibiotic agents status: Secondary | ICD-10-CM

## 2014-12-26 DIAGNOSIS — M3214 Glomerular disease in systemic lupus erythematosus: Secondary | ICD-10-CM | POA: Diagnosis present

## 2014-12-26 DIAGNOSIS — I509 Heart failure, unspecified: Secondary | ICD-10-CM

## 2014-12-26 DIAGNOSIS — R042 Hemoptysis: Secondary | ICD-10-CM | POA: Diagnosis not present

## 2014-12-26 DIAGNOSIS — Z888 Allergy status to other drugs, medicaments and biological substances status: Secondary | ICD-10-CM | POA: Diagnosis not present

## 2014-12-26 DIAGNOSIS — J810 Acute pulmonary edema: Secondary | ICD-10-CM

## 2014-12-26 DIAGNOSIS — I5023 Acute on chronic systolic (congestive) heart failure: Secondary | ICD-10-CM | POA: Diagnosis present

## 2014-12-26 DIAGNOSIS — F209 Schizophrenia, unspecified: Secondary | ICD-10-CM | POA: Diagnosis present

## 2014-12-26 DIAGNOSIS — M329 Systemic lupus erythematosus, unspecified: Secondary | ICD-10-CM | POA: Diagnosis present

## 2014-12-26 DIAGNOSIS — R079 Chest pain, unspecified: Secondary | ICD-10-CM

## 2014-12-26 DIAGNOSIS — R059 Cough, unspecified: Secondary | ICD-10-CM

## 2014-12-26 DIAGNOSIS — F319 Bipolar disorder, unspecified: Secondary | ICD-10-CM | POA: Diagnosis not present

## 2014-12-26 DIAGNOSIS — I10 Essential (primary) hypertension: Secondary | ICD-10-CM | POA: Diagnosis present

## 2014-12-26 DIAGNOSIS — F29 Unspecified psychosis not due to a substance or known physiological condition: Secondary | ICD-10-CM | POA: Diagnosis not present

## 2014-12-26 DIAGNOSIS — Z992 Dependence on renal dialysis: Secondary | ICD-10-CM | POA: Diagnosis not present

## 2014-12-26 DIAGNOSIS — F17210 Nicotine dependence, cigarettes, uncomplicated: Secondary | ICD-10-CM | POA: Diagnosis present

## 2014-12-26 DIAGNOSIS — Z8659 Personal history of other mental and behavioral disorders: Secondary | ICD-10-CM

## 2014-12-26 DIAGNOSIS — N2581 Secondary hyperparathyroidism of renal origin: Secondary | ICD-10-CM | POA: Diagnosis present

## 2014-12-26 DIAGNOSIS — M545 Low back pain: Secondary | ICD-10-CM | POA: Diagnosis present

## 2014-12-26 DIAGNOSIS — E8770 Fluid overload, unspecified: Secondary | ICD-10-CM | POA: Diagnosis present

## 2014-12-26 LAB — BASIC METABOLIC PANEL
Anion gap: 16 — ABNORMAL HIGH (ref 5–15)
BUN: 66 mg/dL — ABNORMAL HIGH (ref 6–20)
CHLORIDE: 98 mmol/L — AB (ref 101–111)
CO2: 23 mmol/L (ref 22–32)
Calcium: 9.6 mg/dL (ref 8.9–10.3)
Creatinine, Ser: 12.62 mg/dL — ABNORMAL HIGH (ref 0.44–1.00)
GFR calc non Af Amer: 3 mL/min — ABNORMAL LOW (ref >60)
GFR, EST AFRICAN AMERICAN: 4 mL/min — AB (ref >60)
Glucose, Bld: 107 mg/dL — ABNORMAL HIGH (ref 65–99)
POTASSIUM: 4.3 mmol/L (ref 3.5–5.1)
SODIUM: 137 mmol/L (ref 135–145)

## 2014-12-26 LAB — RENAL FUNCTION PANEL
Albumin: 2.6 g/dL — ABNORMAL LOW (ref 3.5–5.0)
Anion gap: 17 — ABNORMAL HIGH (ref 5–15)
BUN: 70 mg/dL — ABNORMAL HIGH (ref 6–20)
CO2: 22 mmol/L (ref 22–32)
Calcium: 9.1 mg/dL (ref 8.9–10.3)
Chloride: 98 mmol/L — ABNORMAL LOW (ref 101–111)
Creatinine, Ser: 12.87 mg/dL — ABNORMAL HIGH (ref 0.44–1.00)
GFR calc Af Amer: 4 mL/min — ABNORMAL LOW (ref >60)
GFR calc non Af Amer: 3 mL/min — ABNORMAL LOW (ref >60)
Glucose, Bld: 104 mg/dL — ABNORMAL HIGH (ref 65–99)
Phosphorus: 11 mg/dL — ABNORMAL HIGH (ref 2.5–4.6)
Potassium: 4.5 mmol/L (ref 3.5–5.1)
Sodium: 137 mmol/L (ref 135–145)

## 2014-12-26 LAB — URINE MICROSCOPIC-ADD ON

## 2014-12-26 LAB — CBC WITH DIFFERENTIAL/PLATELET
BASOS PCT: 1 %
Basophils Absolute: 0 10*3/uL (ref 0.0–0.1)
EOS ABS: 0.1 10*3/uL (ref 0.0–0.7)
Eosinophils Relative: 3 %
HCT: 27.9 % — ABNORMAL LOW (ref 36.0–46.0)
HEMOGLOBIN: 8.6 g/dL — AB (ref 12.0–15.0)
LYMPHS ABS: 1.1 10*3/uL (ref 0.7–4.0)
Lymphocytes Relative: 25 %
MCH: 27 pg (ref 26.0–34.0)
MCHC: 30.8 g/dL (ref 30.0–36.0)
MCV: 87.5 fL (ref 78.0–100.0)
Monocytes Absolute: 0.4 10*3/uL (ref 0.1–1.0)
Monocytes Relative: 9 %
NEUTROS PCT: 62 %
Neutro Abs: 2.7 10*3/uL (ref 1.7–7.7)
Platelets: 215 10*3/uL (ref 150–400)
RBC: 3.19 MIL/uL — AB (ref 3.87–5.11)
RDW: 16.9 % — ABNORMAL HIGH (ref 11.5–15.5)
WBC: 4.3 10*3/uL (ref 4.0–10.5)

## 2014-12-26 LAB — URINALYSIS, ROUTINE W REFLEX MICROSCOPIC
Bilirubin Urine: NEGATIVE
Glucose, UA: 100 mg/dL — AB
KETONES UR: NEGATIVE mg/dL
NITRITE: NEGATIVE
PH: 8 (ref 5.0–8.0)
Specific Gravity, Urine: 1.012 (ref 1.005–1.030)
Urobilinogen, UA: 0.2 mg/dL (ref 0.0–1.0)

## 2014-12-26 LAB — CBC
HCT: 25.1 % — ABNORMAL LOW (ref 36.0–46.0)
Hemoglobin: 7.9 g/dL — ABNORMAL LOW (ref 12.0–15.0)
MCH: 27.5 pg (ref 26.0–34.0)
MCHC: 31.5 g/dL (ref 30.0–36.0)
MCV: 87.5 fL (ref 78.0–100.0)
Platelets: 192 K/uL (ref 150–400)
RBC: 2.87 MIL/uL — ABNORMAL LOW (ref 3.87–5.11)
RDW: 16.8 % — ABNORMAL HIGH (ref 11.5–15.5)
WBC: 4 10*3/uL (ref 4.0–10.5)

## 2014-12-26 MED ORDER — LIDOCAINE HCL (PF) 1 % IJ SOLN: 5.0000 mL | INTRAMUSCULAR | Status: DC | PRN

## 2014-12-26 MED ORDER — DARBEPOETIN ALFA 200 MCG/0.4ML IJ SOSY
200.0000 ug | PREFILLED_SYRINGE | INTRAMUSCULAR | Status: DC
  Administered 2014-12-27: 200 ug via INTRAVENOUS

## 2014-12-26 MED ORDER — PENTAFLUOROPROP-TETRAFLUOROETH EX AERO: 1.0000 "application " | INHALATION_SPRAY | CUTANEOUS | Status: DC | PRN

## 2014-12-26 MED ORDER — ONDANSETRON HCL 4 MG PO TABS: 4.0000 mg | ORAL_TABLET | Freq: Four times a day (QID) | ORAL | Status: DC | PRN

## 2014-12-26 MED ORDER — SODIUM CHLORIDE 0.9 % IV SOLN: 100.0000 mL | INTRAVENOUS | Status: DC | PRN

## 2014-12-26 MED ORDER — SODIUM CHLORIDE 0.9 % IJ SOLN: 3.0000 mL | INTRAMUSCULAR | Status: DC | PRN

## 2014-12-26 MED ORDER — LIDOCAINE-PRILOCAINE 2.5-2.5 % EX CREA: 1.0000 "application " | TOPICAL_CREAM | CUTANEOUS | Status: DC | PRN

## 2014-12-26 MED ORDER — HEPARIN SODIUM (PORCINE) 1000 UNIT/ML DIALYSIS: 1000.0000 [IU] | INTRAMUSCULAR | Status: DC | PRN

## 2014-12-26 MED ORDER — HYDROCODONE-ACETAMINOPHEN 5-325 MG PO TABS
ORAL_TABLET | ORAL | Status: AC
  Filled 2014-12-26: qty 1

## 2014-12-26 MED ORDER — ALTEPLASE 2 MG IJ SOLR: 2.0000 mg | Freq: Once | INTRAMUSCULAR | Status: DC | PRN

## 2014-12-26 MED ORDER — HYDROCODONE-ACETAMINOPHEN 5-325 MG PO TABS
1.0000 | ORAL_TABLET | Freq: Once | ORAL | Status: AC
  Administered 2014-12-26: 1 via ORAL
  Filled 2014-12-26: qty 1

## 2014-12-26 MED ORDER — ONDANSETRON HCL 4 MG/2ML IJ SOLN: 4.0000 mg | Freq: Four times a day (QID) | INTRAMUSCULAR | Status: DC | PRN

## 2014-12-26 MED ORDER — HEPARIN SODIUM (PORCINE) 1000 UNIT/ML DIALYSIS: 20.0000 [IU]/kg | INTRAMUSCULAR | Status: DC | PRN

## 2014-12-26 MED ORDER — HEPARIN SODIUM (PORCINE) 1000 UNIT/ML DIALYSIS: 100.0000 [IU]/kg | INTRAMUSCULAR | Status: DC | PRN

## 2014-12-26 MED ORDER — CARVEDILOL 6.25 MG PO TABS
6.2500 mg | ORAL_TABLET | Freq: Two times a day (BID) | ORAL | Status: DC
  Filled 2014-12-26: qty 1

## 2014-12-26 MED ORDER — SODIUM CHLORIDE 0.9 % IV SOLN
125.0000 mg | INTRAVENOUS | Status: DC
  Administered 2014-12-27 – 2014-12-28 (×2): 125 mg via INTRAVENOUS
  Filled 2014-12-26 (×3): qty 10

## 2014-12-26 MED ORDER — RENA-VITE PO TABS
1.0000 | ORAL_TABLET | Freq: Every day | ORAL | Status: DC
  Administered 2014-12-27: 1 via ORAL
  Filled 2014-12-26 (×2): qty 1

## 2014-12-26 MED ORDER — TRIAMCINOLONE ACETONIDE 0.025 % EX OINT: 1.0000 "application " | TOPICAL_OINTMENT | Freq: Two times a day (BID) | CUTANEOUS | Status: DC

## 2014-12-26 MED ORDER — HYDROCODONE-ACETAMINOPHEN 5-325 MG PO TABS
1.0000 | ORAL_TABLET | Freq: Once | ORAL | Status: AC
  Administered 2014-12-26: 1 via ORAL

## 2014-12-26 MED ORDER — HEPARIN SODIUM (PORCINE) 1000 UNIT/ML DIALYSIS
1000.0000 [IU] | INTRAMUSCULAR | Status: DC | PRN
Start: 2014-12-26 — End: 2014-12-26

## 2014-12-26 MED ORDER — IPRATROPIUM BROMIDE 0.02 % IN SOLN
0.5000 mg | Freq: Four times a day (QID) | RESPIRATORY_TRACT | Status: DC
  Administered 2014-12-27: 0.5 mg via RESPIRATORY_TRACT
  Filled 2014-12-26 (×3): qty 2.5

## 2014-12-26 MED ORDER — SODIUM CHLORIDE 0.9 % IV SOLN: 250.0000 mL | INTRAVENOUS | Status: DC | PRN

## 2014-12-26 MED ORDER — SODIUM CHLORIDE 0.9 % IJ SOLN: 3.0000 mL | Freq: Two times a day (BID) | INTRAMUSCULAR | Status: DC

## 2014-12-26 MED ORDER — IPRATROPIUM-ALBUTEROL 0.5-2.5 (3) MG/3ML IN SOLN: 3.0000 mL | Freq: Four times a day (QID) | RESPIRATORY_TRACT | Status: DC | PRN

## 2014-12-26 MED ORDER — TRIAMCINOLONE ACETONIDE 0.025 % EX CREA
TOPICAL_CREAM | Freq: Two times a day (BID) | CUTANEOUS | Status: DC
  Administered 2014-12-26: 1 via TOPICAL
  Administered 2014-12-27 – 2014-12-28 (×2): via TOPICAL
  Filled 2014-12-26: qty 15

## 2014-12-26 MED ORDER — NEPRO/CARBSTEADY PO LIQD: 237.0000 mL | ORAL | Status: DC | PRN

## 2014-12-26 MED ORDER — SODIUM CHLORIDE 0.9 % IJ SOLN
3.0000 mL | Freq: Two times a day (BID) | INTRAMUSCULAR | Status: DC
  Administered 2014-12-26 – 2014-12-28 (×3): 3 mL via INTRAVENOUS

## 2014-12-26 MED ORDER — HEPARIN SODIUM (PORCINE) 1000 UNIT/ML DIALYSIS
20.0000 [IU]/kg | INTRAMUSCULAR | Status: DC | PRN
  Administered 2014-12-26: 1400 [IU] via INTRAVENOUS_CENTRAL

## 2014-12-26 MED ORDER — ENOXAPARIN SODIUM 30 MG/0.3ML ~~LOC~~ SOLN
30.0000 mg | SUBCUTANEOUS | Status: DC
  Administered 2014-12-26 – 2014-12-27 (×2): 30 mg via SUBCUTANEOUS
  Filled 2014-12-26 (×2): qty 0.3

## 2014-12-26 NOTE — Progress Notes (Signed)
  HPI:  Pt presents for a same day appointment to discuss cough and congestion.  Has had cough for about 5 days. Started on Thursday, worsened on Saturday. Having cough that is productive of mucous, tinged with some blood. Has pain with deep inspiration. Feels short of breath, which she attributes to being due for dialysis. Reports having appointment today to get dialysis, goes to ED to get dialysis as she is still trying to get into an outpatient dialysis center. No fevers. Eating and drinking normally. Urination is normal for her. Did have some mucous after a stool recently. Has pain with cough, and worsened pain on R side of chest. Had to take some Advil PM last night in order to sleep with the cough and pain. Has also had congestion in nose, blowing it a lot.   Patient does endorse a history of prior episode of blood clot. Not able to provide specifics, but says a "blood clot almost went to my heart" earlier this year.  ROS: See HPI  Curry: hx acute myopericarditis in 2/16, chronic CHF, anemia, bipolar/schizophrenia/schizoaffective disorder, ESRD on dialysis, genital herpes, HTN, SLE, homelessness  PHYSICAL EXAM: BP 158/114 mmHg  Pulse 107  Temp(Src) 97.8 F (36.6 C) (Oral)  Wt 148 lb (67.132 kg)  SpO2 94% Gen: NAD, cooperative, pleasant HEENT: normocephalic, atraumatic. moist mucous membranes. Oropharynx clear. Tympanic membranes clear bilaterally. Nares patent.  Heart: regular rate and rhythm Lungs: clear to auscultation bilaterally, NWOB Neuro: grossly nonfocal, speech normal Ext: No appreciable lower extremity edema bilaterally, no calf tenderness  ASSESSMENT/PLAN:  1. Cough/congestion: with pleuritic chest pain, cough productive of blood-tinged sputum, shortness of breath, and mild tachycardia there is concern for possible PE. Patient does endorse a history of some form of thromboembolism in the past (unclear history from her). -  Patient has appointment in ED for dialysis  directly after this visit, so I recommended she be seen in ED for these complaints as well.  - Would recommend CXR and CT angio to rule out pneumonia vs pulmonary embolism.  - Patient also requested pain medication -advised that we first evaluate etiology of her symptoms prior to covering with pain medicine.  - Advised ED or renal provider would potentially be able to provide pain medication if they deem it appropriate, once workup is complete. - report called to ED team, to make them aware of my recommendations for additional workup.  Note: due to prior episodes of patient acting out and using foul language during visits, security guard was preemptively present in [facility] to be available during this visit. Visit also conducted with Latina Craver RN present in room as chaperone. Patient remained pleasant throughout visit. Security services were fortunately not utilized. Recommend future providers utilize chaperone in room during encounters with patient.  Mitchell. Ardelia Mems, [place]

## 2014-12-26 NOTE — Care Management (Signed)
As per [facility] HD staff patient still have not been accepted by an outpatient HD center as of yet. Day HD will continue to follow up to [facility] for an [address]. No further ED CM needs identified.

## 2014-12-26 NOTE — Patient Instructions (Signed)
Please go down to the Emergency Room I recommend you have a chest xray and CT scan of your lungs to look for pneumonia and a blood clot They will be able to prescribe any necessary medications there.
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
## 2014-12-26 NOTE — ED Notes (Signed)
Patient reports cough since Thursday, productive in nature. Patient reports 3 day history of chest discomfort radiating from right chest to now left side of chest. Patient reports sob. No swelling to lower legs. Last dialysis Saturday, due for dialysis today, access to right arm.
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
## 2014-12-26 NOTE — ED Notes (Signed)
Admitting MD at bedside.

## 2014-12-26 NOTE — ED Notes (Signed)
Pt here for routine dialysis and also c/o productive cough with some blood tinged sputum and pain with cough in back and right chest
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
## 2014-12-27 DIAGNOSIS — N186 End stage renal disease: Secondary | ICD-10-CM

## 2014-12-27 DIAGNOSIS — F319 Bipolar disorder, unspecified: Secondary | ICD-10-CM | POA: Diagnosis present

## 2014-12-27 DIAGNOSIS — M545 Low back pain: Secondary | ICD-10-CM | POA: Diagnosis present

## 2014-12-27 DIAGNOSIS — Z91048 Other nonmedicinal substance allergy status: Secondary | ICD-10-CM | POA: Diagnosis not present

## 2014-12-27 DIAGNOSIS — J810 Acute pulmonary edema: Secondary | ICD-10-CM

## 2014-12-27 DIAGNOSIS — I120 Hypertensive chronic kidney disease with stage 5 chronic kidney disease or end stage renal disease: Secondary | ICD-10-CM | POA: Diagnosis not present

## 2014-12-27 DIAGNOSIS — D631 Anemia in chronic kidney disease: Secondary | ICD-10-CM | POA: Diagnosis present

## 2014-12-27 DIAGNOSIS — F209 Schizophrenia, unspecified: Secondary | ICD-10-CM | POA: Diagnosis present

## 2014-12-27 DIAGNOSIS — R392 Extrarenal uremia: Secondary | ICD-10-CM | POA: Diagnosis not present

## 2014-12-27 DIAGNOSIS — M3214 Glomerular disease in systemic lupus erythematosus: Secondary | ICD-10-CM | POA: Diagnosis present

## 2014-12-27 DIAGNOSIS — I5023 Acute on chronic systolic (congestive) heart failure: Secondary | ICD-10-CM | POA: Diagnosis present

## 2014-12-27 DIAGNOSIS — M329 Systemic lupus erythematosus, unspecified: Secondary | ICD-10-CM | POA: Diagnosis present

## 2014-12-27 DIAGNOSIS — N2581 Secondary hyperparathyroidism of renal origin: Secondary | ICD-10-CM | POA: Diagnosis present

## 2014-12-27 DIAGNOSIS — Z992 Dependence on renal dialysis: Secondary | ICD-10-CM | POA: Diagnosis not present

## 2014-12-27 DIAGNOSIS — F29 Unspecified psychosis not due to a substance or known physiological condition: Secondary | ICD-10-CM | POA: Diagnosis present

## 2014-12-27 DIAGNOSIS — Z79899 Other long term (current) drug therapy: Secondary | ICD-10-CM | POA: Diagnosis not present

## 2014-12-27 DIAGNOSIS — Z888 Allergy status to other drugs, medicaments and biological substances status: Secondary | ICD-10-CM | POA: Diagnosis not present

## 2014-12-27 DIAGNOSIS — F17210 Nicotine dependence, cigarettes, uncomplicated: Secondary | ICD-10-CM | POA: Diagnosis present

## 2014-12-27 DIAGNOSIS — Z881 Allergy status to other antibiotic agents status: Secondary | ICD-10-CM | POA: Diagnosis not present

## 2014-12-27 LAB — COMPREHENSIVE METABOLIC PANEL
ALBUMIN: 2.7 g/dL — AB (ref 3.5–5.0)
ALT: 15 U/L (ref 14–54)
AST: 20 U/L (ref 15–41)
Alkaline Phosphatase: 83 U/L (ref 38–126)
Anion gap: 10 (ref 5–15)
BILIRUBIN TOTAL: 0.6 mg/dL (ref 0.3–1.2)
BUN: 24 mg/dL — AB (ref 6–20)
CHLORIDE: 100 mmol/L — AB (ref 101–111)
CO2: 27 mmol/L (ref 22–32)
Calcium: 9 mg/dL (ref 8.9–10.3)
Creatinine, Ser: 6.34 mg/dL — ABNORMAL HIGH (ref 0.44–1.00)
GFR calc Af Amer: 9 mL/min — ABNORMAL LOW (ref >60)
GFR calc non Af Amer: 8 mL/min — ABNORMAL LOW (ref >60)
GLUCOSE: 83 mg/dL (ref 65–99)
POTASSIUM: 3.3 mmol/L — AB (ref 3.5–5.1)
Sodium: 137 mmol/L (ref 135–145)
Total Protein: 7.4 g/dL (ref 6.5–8.1)

## 2014-12-27 LAB — URINE CULTURE

## 2014-12-27 LAB — MRSA PCR SCREENING: MRSA BY PCR: NEGATIVE

## 2014-12-27 LAB — TSH: TSH: 0.876 u[IU]/mL (ref 0.350–4.500)

## 2014-12-27 MED ORDER — ACETAMINOPHEN 325 MG PO TABS
650.0000 mg | ORAL_TABLET | Freq: Four times a day (QID) | ORAL | Status: DC | PRN
  Administered 2014-12-27: 650 mg via ORAL
  Filled 2014-12-27: qty 2

## 2014-12-27 MED ORDER — HYDROXYZINE HCL 25 MG PO TABS
25.0000 mg | ORAL_TABLET | Freq: Three times a day (TID) | ORAL | Status: DC | PRN
  Filled 2014-12-27: qty 1

## 2014-12-27 MED ORDER — SEVELAMER CARBONATE 800 MG PO TABS
1600.0000 mg | ORAL_TABLET | Freq: Three times a day (TID) | ORAL | Status: DC
  Administered 2014-12-27 – 2014-12-28 (×3): 1600 mg via ORAL
  Filled 2014-12-27 (×3): qty 2

## 2014-12-27 MED ORDER — PENTAFLUOROPROP-TETRAFLUOROETH EX AERO: 1.0000 "application " | INHALATION_SPRAY | CUTANEOUS | Status: DC | PRN

## 2014-12-27 MED ORDER — SODIUM CHLORIDE 0.9 % IV SOLN: 100.0000 mL | INTRAVENOUS | Status: DC | PRN

## 2014-12-27 MED ORDER — DOCUSATE SODIUM 283 MG RE ENEM
1.0000 | ENEMA | RECTAL | Status: DC | PRN
  Filled 2014-12-27: qty 1

## 2014-12-27 MED ORDER — SORBITOL 70 % SOLN: 30.0000 mL | Status: DC | PRN

## 2014-12-27 MED ORDER — HYDROXYZINE HCL 25 MG PO TABS
25.0000 mg | ORAL_TABLET | Freq: Three times a day (TID) | ORAL | Status: DC | PRN
  Administered 2014-12-27: 25 mg via ORAL

## 2014-12-27 MED ORDER — HEPARIN SODIUM (PORCINE) 1000 UNIT/ML DIALYSIS: 1000.0000 [IU] | INTRAMUSCULAR | Status: DC | PRN

## 2014-12-27 MED ORDER — ACETAMINOPHEN 650 MG RE SUPP: 650.0000 mg | Freq: Four times a day (QID) | RECTAL | Status: DC | PRN

## 2014-12-27 MED ORDER — NEPRO/CARBSTEADY PO LIQD: 237.0000 mL | Freq: Three times a day (TID) | ORAL | Status: DC | PRN

## 2014-12-27 MED ORDER — ALTEPLASE 2 MG IJ SOLR: 2.0000 mg | Freq: Once | INTRAMUSCULAR | Status: DC | PRN

## 2014-12-27 MED ORDER — CALCIUM CARBONATE 1250 MG/5ML PO SUSP: 500.0000 mg | Freq: Four times a day (QID) | ORAL | Status: DC | PRN

## 2014-12-27 MED ORDER — LIDOCAINE HCL (PF) 1 % IJ SOLN: 5.0000 mL | INTRAMUSCULAR | Status: DC | PRN

## 2014-12-27 MED ORDER — DARBEPOETIN ALFA 200 MCG/0.4ML IJ SOSY
PREFILLED_SYRINGE | INTRAMUSCULAR | Status: AC
  Administered 2014-12-27: 200 ug via INTRAVENOUS
  Filled 2014-12-27: qty 0.4

## 2014-12-27 MED ORDER — ONDANSETRON HCL 4 MG PO TABS: 4.0000 mg | ORAL_TABLET | Freq: Four times a day (QID) | ORAL | Status: DC | PRN

## 2014-12-27 MED ORDER — ALTEPLASE 2 MG IJ SOLR
2.0000 mg | Freq: Once | INTRAMUSCULAR | Status: DC | PRN
  Filled 2014-12-27: qty 2

## 2014-12-27 MED ORDER — SODIUM CHLORIDE 0.9 % IV SOLN
125.0000 mg | Freq: Once | INTRAVENOUS | Status: DC
  Filled 2014-12-27: qty 10

## 2014-12-27 MED ORDER — LIDOCAINE-PRILOCAINE 2.5-2.5 % EX CREA: 1.0000 "application " | TOPICAL_CREAM | CUTANEOUS | Status: DC | PRN

## 2014-12-27 MED ORDER — HEPARIN SODIUM (PORCINE) 1000 UNIT/ML DIALYSIS: 20.0000 [IU]/kg | INTRAMUSCULAR | Status: DC | PRN

## 2014-12-27 MED ORDER — ONDANSETRON HCL 4 MG/2ML IJ SOLN: 4.0000 mg | Freq: Four times a day (QID) | INTRAMUSCULAR | Status: DC | PRN

## 2014-12-27 MED ORDER — METOPROLOL TARTRATE 50 MG PO TABS
50.0000 mg | ORAL_TABLET | Freq: Two times a day (BID) | ORAL | Status: DC
  Administered 2014-12-27 – 2014-12-28 (×2): 50 mg via ORAL
  Filled 2014-12-27 (×4): qty 1

## 2014-12-27 MED ORDER — ZOLPIDEM TARTRATE 5 MG PO TABS: 5.0000 mg | ORAL_TABLET | Freq: Every evening | ORAL | Status: DC | PRN

## 2014-12-27 MED ORDER — HYDROCODONE-ACETAMINOPHEN 5-325 MG PO TABS
1.0000 | ORAL_TABLET | Freq: Once | ORAL | Status: AC
  Administered 2014-12-27: 1 via ORAL
  Filled 2014-12-27: qty 1

## 2014-12-27 MED ORDER — HYDROXYZINE HCL 25 MG PO TABS
ORAL_TABLET | ORAL | Status: AC
  Filled 2014-12-27: qty 1

## 2014-12-27 MED ORDER — HYDROCODONE-ACETAMINOPHEN 5-325 MG PO TABS
1.0000 | ORAL_TABLET | Freq: Three times a day (TID) | ORAL | Status: DC | PRN
  Administered 2014-12-27 – 2014-12-28 (×3): 1 via ORAL
  Filled 2014-12-27 (×3): qty 1

## 2014-12-27 MED ORDER — CAMPHOR-MENTHOL 0.5-0.5 % EX LOTN
1.0000 "application " | TOPICAL_LOTION | Freq: Three times a day (TID) | CUTANEOUS | Status: DC | PRN
  Filled 2014-12-27: qty 222

## 2014-12-27 MED ORDER — HEPARIN SODIUM (PORCINE) 1000 UNIT/ML DIALYSIS
6000.0000 [IU] | Freq: Once | INTRAMUSCULAR | Status: DC
  Filled 2014-12-27: qty 6

## 2014-12-27 MED ORDER — HYDROXYZINE HCL 25 MG PO TABS
ORAL_TABLET | ORAL | Status: AC
  Administered 2014-12-27: 25 mg via ORAL
  Filled 2014-12-27: qty 1
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
## 2014-12-27 NOTE — Progress Notes (Signed)
DateHb tsatDarbeFe  9-108.0 9-89.2 9-38.5100 ug 9-18.6 8-309.3 8-288.5 8-259.5 11-2208.9- 8-69.860 ug 7-258.823%200 7-198.3200 7-15200 7-11 7-96.931%feraheme 510mg  10-4198 6-256.87%  Old HD orders from [facility] Feb 2016 > 4 hrs59kg 2K/2CaHeparin 6000AVF R

## 2014-12-27 NOTE — Progress Notes (Signed)
[facility] Progress Note  Assessment/Plan: 1. Cough/Congestion: Per primary. Favoring volume overload. HD last night. Improved. HD again today. 2. Pleuritic Pain VS CVA pain: UA show turbid urine trace leuks, many bacteria. Nitrite neg. ? Contaminated specimen. Will repeat. 3. ESRD - HasHD 3 times weekly at [facility]. Schedule is erratic but does show up for treatments. Trying to get into HD center but so far no one has accepted her. For HD today. 4. Hypertension/volume - HD last night, Net UF 5000. LE edema improved, post wt 62.8 kg. Still hypertensive. Will attempt UFG 3-3.5 liters again today, shorten time to 3.5 hours.  5. Anemia - Last HGB 9.9 (12/23/14). Will check prior to HD. Aranesp/Nulicet with HD.  6. Metabolic bone disease - Phos 11.0 Ca 9.0 C Ca 10.2 change to 2.0K 2.0 Ca bath. Doubt pt will take binders at home but will start renvela.  7. Nutrition - Renal diet with fld restrictions.  Rita H. Brown NP-C 12/27/2014, 9:22 AM  [facility] [phone]  Pt seen, examined and agree w A/P as above. Plan HD again today and then should be ok for dc. Flank pain gone, no evidence UTI on UA.  Mild hemoptysis due to pulm edema, resolved.  Kelly Splinter MD pager [phone]    cell [phone] 12/27/2014, 11:46 AM    Subjective:  "My chest still hurts, my arm hurts but I feel better". Still C/O pain when she takes a deep breath. Low grade temp during night. Lying supine without WOB.   Objective Filed Vitals:   12/27/14 0228 12/27/14 0235 12/27/14 0534 12/27/14 0738  BP: 154/106 154/106 168/116   Pulse: 103 110 118   Temp: 100.1 F (37.8 C)  98.2 F (36.8 C)   TempSrc: Oral  Axillary   Resp: 26     Weight: 62.8 kg (138 lb 7.2 oz)     SpO2: 98%  99% 97%   Physical Exam General: chronically ill appearing female NAD Heart:S1 S2 RRR. NO R/MG Lungs: Bilateral breath sounds CTA. No pleural rub. No WOB.  Abdomen: Active BS, soft,  nondistended Extremities: LE edema improved, still trace edema RLE.  Dialysis Access:LUA AVF + Bruit Dialysis Orders: Center: [facility] On variable days when she shows up for treatments.  EDW 64 kg HD Bath 2.0K 2.25 Ca Time 4 hours Heparin No Heparin. Access LUA AVF BFR 400 DFR 800  Receives Aranesp 200 mcg IV weekly, Nulecit 125 mg IV per treatment  Additional Objective Labs: Basic Metabolic Panel:  Recent Labs Lab 12/26/14 1715 12/26/14 2244 12/27/14 0440  NA 137 137 137  K 4.3 4.5 3.3*  CL 98* 98* 100*  CO2 23 22 27   GLUCOSE 107* 104* 83  BUN 66* 70* 24*  CREATININE 12.62* 12.87* 6.34*  CALCIUM 9.6 9.1 9.0  PHOS  --  11.0*  --    Liver Function Tests:  Recent Labs Lab 12/26/14 2244 12/27/14 0440  AST  --  20  ALT  --  15  ALKPHOS  --  83  BILITOT  --  0.6  PROT  --  7.4  ALBUMIN 2.6* 2.7*   No results for input(s): LIPASE, AMYLASE in the last 168 hours. CBC:  Recent Labs Lab 12/23/14 1206 12/26/14 1715 12/26/14 2244  WBC  --  4.3 4.0  NEUTROABS  --  2.7  --   HGB 9.9* 8.6* 7.9*  HCT 29.0* 27.9* 25.1*  MCV  --  87.5 87.5  PLT  --  215  192   Blood Culture    Component Value Date/Time   SDES URINE, CLEAN CATCH 12/26/2014 2246   SPECREQUEST NONE 12/26/2014 2246   CULT TOO YOUNG TO READ 12/26/2014 2246   REPTSTATUS PENDING 12/26/2014 2246    Cardiac Enzymes: No results for input(s): CKTOTAL, CKMB, CKMBINDEX, TROPONINI in the last 168 hours. CBG: No results for input(s): GLUCAP in the last 168 hours. Iron Studies: No results for input(s): IRON, TIBC, TRANSFERRIN, FERRITIN in the last 72 hours. @lablastinr3 @ Studies/Results: Dg Chest 2 View  12/26/2014   CLINICAL DATA:  Cough and chest pain.  EXAM: CHEST  2 VIEW  COMPARISON:  November 30, 2014.  FINDINGS: Stable cardiomegaly with central pulmonary vascular congestion is noted. Mild bilateral pleural effusions are noted. No pneumothorax is noted. Bony thorax is unremarkable. Mild  bibasilar subsegmental atelectasis is noted.  IMPRESSION: Stable cardiomegaly with central pulmonary vascular congestion is noted consistent with congestive heart failure. Mild bilateral pleural effusions are noted which are slightly improved compared to prior exam. Probable mild bibasilar subsegmental atelectasis is noted.   Electronically Signed   By: Marijo Conception, M.D.   On: 12/26/2014 14:02   Medications:   . carvedilol  6.25 mg Oral BID WC  . [START ON 01/02/2015] darbepoetin (ARANESP) injection - DIALYSIS  200 mcg Intravenous Q Tue-HD  . enoxaparin (LOVENOX) injection  30 mg Subcutaneous Q24H  . [START ON 12/28/2014] ferric gluconate (FERRLECIT/NULECIT) IV  125 mg Intravenous Q T,Th,Sa-HD  . HYDROcodone-acetaminophen      . hydrOXYzine      . ipratropium  0.5 mg Nebulization Q6H  . multivitamin  1 tablet Oral QHS  . sodium chloride  3 mL Intravenous Q12H  . sodium chloride  3 mL Intravenous Q12H  . triamcinolone   Topical BID

## 2014-12-27 NOTE — Progress Notes (Addendum)
New Admission Note:  Arrival Method: Via stretcher  Mental Orientation: Alert and oriented x4 Telemetry: Placed on box 14, CCMD notified Assessment: Completed Skin: Rash/ scabs on chest, back, legs, arms, thighs IV: left hand, saline locked Pain: MD notified Tubes: n/a Safety Measures: Safety Fall Prevention Plan was given, discussed. Admission: Completed [address] Orientation: Patient has been orientated to the room, unit and the staff. Family: none at bedside  Orders have been reviewed and implemented. Will continue to monitor the patient. Call light has been placed within reach and bed alarm has been activated.   Leandro Reasoner BSN, RN  Phone Number: [phone] [facility]

## 2014-12-27 NOTE — Discharge Instructions (Signed)
Follow with Primary MD Smiley Houseman, MD in 7 days   Get CBC, CMP, 2 view Chest X ray checked  by Primary MD next visit.    Activity: As tolerated with Full fall precautions use walker/cane & assistance as needed   Disposition Home     Diet: Renal,  Check your Weight same time everyday, if you gain over 2 pounds, or you develop in leg swelling, experience more shortness of breath or chest pain, call your Primary MD immediately. Follow Cardiac Low Salt Diet and 1.2 lit/day fluid restriction.   On your next visit with your primary care physician please Get Medicines reviewed and adjusted.   Please request your Prim.MD to go over all Hospital Tests and Procedure/Radiological results at the follow up, please get all Hospital records sent to your Prim MD by signing hospital release before you go home.   If you experience worsening of your admission symptoms, develop shortness of breath, life threatening emergency, suicidal or homicidal thoughts you must seek medical attention immediately by calling 911 or calling your MD immediately  if symptoms less severe.  You Must read complete instructions/literature along with all the possible adverse reactions/side effects for all the Medicines you take and that have been prescribed to you. Take any new Medicines after you have completely understood and accpet all the possible adverse reactions/side effects.   Do not drive, operating heavy machinery, perform activities at heights, swimming or participation in water activities or provide baby sitting services if your were admitted for syncope or siezures until you have seen by Primary MD or a Neurologist and advised to do so again.  Do not drive when taking Pain medications.    Do not take more than prescribed Pain, Sleep and Anxiety Medications  Special Instructions: If you have smoked or chewed Tobacco  in the last 2 yrs please stop smoking, stop any regular Alcohol  and or any Recreational  drug use.  Wear Seat belts while driving.   Please note  You were cared for by a hospitalist during your hospital stay. If you have any questions about your discharge medications or the care you received while you were in the hospital after you are discharged, you can call the unit and asked to speak with the hospitalist on call if the hospitalist that took care of you is not available. Once you are discharged, your primary care physician will handle any further medical issues. Please note that NO REFILLS for any discharge medications will be authorized once you are discharged, as it is imperative that you return to your primary care physician (or establish a relationship with a primary care physician if you do not have one) for your aftercare needs so that they can reassess your need for medications and monitor your lab values.

## 2014-12-27 NOTE — Progress Notes (Signed)
Patient Demographics:    Sara [name], is a 34 y.o. female, DOB - 10/28/1980, ZOX:[id number]  Admit date - 12/26/2014   Admitting Physician Reubin Milan, MD  Outpatient Primary MD for the patient is Smiley Houseman, MD  LOS -    Chief Complaint  Patient presents with  . Vascular Access Problem  . Cough        Subjective:    Sara [name] today has, No headache, No chest pain, No abdominal pain - No Nausea, No new weakness tingling or numbness, No Cough - SOB.     Assessment  & Plan :     1. Shortness of breath secondary to pulmonary edema from fluid overload in a patient with ESRD. With acute on chronic systolic heart failure EF 40%, renal consulted patient undergoing dialysis. Currently oxygen free, and shortness of breath is resolved, continue supportive care.   2. ESRD. Has dialysis 3 times per week at [facility] due to problems between her and outpatient dialysis unit. Renal on board being dialyzed.   3. Hypertension with tachycardia. Due to fluid overload, currently no shortness of breath or chest pain, place on low-dose beta blocker and monitor.   4. Anemia of chronic disease. No need for transfusion monitor  H&H.   5. Acute on chronic systolic heart failure. EF 40-45% on last echogram. Plan as in #1 above, place on beta blocker and monitor. Will defer to PCP, nephrologist if she needs outpatient ACE/ARB.    Code Status : Full  Family Communication  : None  Disposition Plan  : home 1-2 days  Consults  :  Renal  Procedures  :    DVT Prophylaxis  :  Lovenox   Lab Results  Component Value Date   PLT 192 12/26/2014    Inpatient Medications  Scheduled Meds: . [START ON 01/02/2015] darbepoetin (ARANESP) injection - DIALYSIS  200 mcg Intravenous Q  Tue-HD  . enoxaparin (LOVENOX) injection  30 mg Subcutaneous Q24H  . [START ON 12/28/2014] ferric gluconate (FERRLECIT/NULECIT) IV  125 mg Intravenous Q T,Th,Sa-HD  . heparin  6,000 Units Dialysis Once in dialysis  . metoprolol tartrate  50 mg Oral BID  . multivitamin  1 tablet Oral QHS  . sevelamer carbonate  1,600 mg Oral TID WC  . sodium chloride  3 mL Intravenous Q12H  . triamcinolone   Topical BID   Continuous Infusions:  PRN Meds:.sodium chloride, sodium chloride, sodium chloride, acetaminophen **OR** acetaminophen, alteplase, calcium carbonate (dosed in mg elemental calcium), camphor-menthol **AND** hydrOXYzine, docusate sodium, feeding supplement (NEPRO CARB STEADY), feeding supplement (NEPRO CARB STEADY), heparin, heparin, HYDROcodone-acetaminophen, ipratropium-albuterol, lidocaine (PF), lidocaine-prilocaine, ondansetron **OR** ondansetron (ZOFRAN) IV, pentafluoroprop-tetrafluoroeth, sodium chloride, sorbitol, zolpidem  Antibiotics  :    Anti-infectives    None        Objective:   Filed Vitals:   12/27/14 1400 12/27/14 1430 12/27/14 1500 12/27/14 1510  BP: 148/90 136/88 140/97 140/90  Pulse: 110 106 110 111  Temp:    98.5 F (36.9 C)  TempSrc:    Oral  Resp: 23 22 25 18   Weight:    58.7 kg (129 lb 6.6 oz)  SpO2:    100%    Wt Readings from Last 3 Encounters:  12/27/14 58.7 kg (129 lb 6.6 oz)  12/26/14 67.132 kg (148 lb)  12/23/14 64.4 kg (141 lb 15.6 oz)     Intake/Output Summary (Last 24 hours) at 12/27/14 1556 Last data filed at 12/27/14 1510  Gross per 24 hour  Intake    720 ml  Output  10000 ml  Net  -9280 ml     Physical Exam  Awake Alert, Oriented X 3, No new F.N deficits, Normal affect [place].AT,PERRAL Supple Neck,No JVD, No cervical lymphadenopathy appriciated.  Symmetrical Chest wall movement, Good air movement bilaterally, CTAB RRR,No Gallops,Rubs or new Murmurs, No Parasternal Heave +ve B.Sounds, Abd Soft, No tenderness, No organomegaly  appriciated, No rebound - guarding or rigidity. No Cyanosis, Clubbing or edema, No new Rash or bruise       Data Review:   Micro Results Recent Results (from the past 240 hour(s))  Culture, Urine     Status: None (Preliminary result)   Collection Time: 12/26/14 10:46 PM  Result Value Ref Range Status   Specimen Description URINE, CLEAN CATCH  Final   Special Requests NONE  Final   Culture TOO YOUNG TO READ  Final   Report Status PENDING  Incomplete  MRSA PCR Screening     Status: None   Collection Time: 12/27/14  6:35 AM  Result Value Ref Range Status   MRSA by PCR NEGATIVE NEGATIVE Final    Comment:        The GeneXpert MRSA Assay (FDA approved for NASAL specimens only), is one component of a comprehensive MRSA colonization surveillance program. It is not intended to diagnose MRSA infection nor to guide or monitor treatment for MRSA infections.     Radiology Reports Dg Chest 2 View  12/26/2014   CLINICAL DATA:  Cough and chest pain.  EXAM: CHEST  2 VIEW  COMPARISON:  November 30, 2014.  FINDINGS: Stable cardiomegaly with central pulmonary vascular congestion is noted. Mild bilateral pleural effusions are noted. No pneumothorax is noted. Bony thorax is unremarkable. Mild bibasilar subsegmental atelectasis is noted.  IMPRESSION: Stable cardiomegaly with central pulmonary vascular congestion is noted consistent with congestive heart failure. Mild bilateral pleural effusions are noted which are slightly improved compared to prior exam. Probable mild bibasilar subsegmental atelectasis is noted.   Electronically Signed   By: Marijo Conception, M.D.   On: 12/26/2014 14:02   Dg Chest 2 View  11/30/2014   CLINICAL DATA:  Increasing shortness of breath since Tuesday, history lupus, end-stage renal disease on dialysis, CHF, smoker, hypertension  EXAM: CHEST  2 VIEW  COMPARISON:  09/11/2014  FINDINGS: Enlargement of cardiac silhouette with pulmonary vascular congestion.  Mediastinal contours  normal.  Mild perihilar infiltrates greater on RIGHT compatible pulmonary edema and mild CHF.  RIGHT pleural effusion and basilar atelectasis, decreased since previous exam.  Tiny LEFT pleural effusion.  No pneumothorax.  Bones unremarkable.  IMPRESSION: Mild CHF.  Decreased RIGHT pleural effusion and basilar atelectasis versus previous study.   Electronically Signed   By: Lavonia Dana M.D.   On: 11/30/2014 17:20     CBC  Recent Labs Lab 12/23/14 1206 12/26/14 1715 12/26/14 2244  WBC  --  4.3 4.0  HGB 9.9* 8.6* 7.9*  HCT 29.0* 27.9* 25.1*  PLT  --  215 192  MCV  --  87.5 87.5  MCH  --  27.0 27.5  MCHC  --  30.8 31.5  RDW  --  16.9* 16.8*  LYMPHSABS  --  1.1  --  MONOABS  --  0.4  --   EOSABS  --  0.1  --   BASOSABS  --  0.0  --     Chemistries   Recent Labs Lab 12/23/14 1206 12/26/14 1715 12/26/14 2244 12/27/14 0440  NA 138 137 137 137  K 4.3 4.3 4.5 3.3*  CL 101 98* 98* 100*  CO2  --  23 22 27   GLUCOSE 81 107* 104* 83  BUN 53* 66* 70* 24*  CREATININE 10.30* 12.62* 12.87* 6.34*  CALCIUM  --  9.6 9.1 9.0  AST  --   --   --  20  ALT  --   --   --  15  ALKPHOS  --   --   --  83  BILITOT  --   --   --  0.6   ------------------------------------------------------------------------------------------------------------------ estimated creatinine clearance is 11.7 mL/min (by C-G formula based on Cr of 6.34). ------------------------------------------------------------------------------------------------------------------ No results for input(s): HGBA1C in the last 72 hours. ------------------------------------------------------------------------------------------------------------------ No results for input(s): CHOL, HDL, LDLCALC, TRIG, CHOLHDL, LDLDIRECT in the last 72 hours. ------------------------------------------------------------------------------------------------------------------ No results for input(s): TSH, T4TOTAL, T3FREE, THYROIDAB in the last 72  hours.  Invalid input(s): FREET3 ------------------------------------------------------------------------------------------------------------------ No results for input(s): VITAMINB12, FOLATE, FERRITIN, TIBC, IRON, RETICCTPCT in the last 72 hours.  Coagulation profile No results for input(s): INR, PROTIME in the last 168 hours.  No results for input(s): DDIMER in the last 72 hours.  Cardiac Enzymes No results for input(s): CKMB, TROPONINI, MYOGLOBIN in the last 168 hours.  Invalid input(s): CK ------------------------------------------------------------------------------------------------------------------ Invalid input(s): POCBNP   Time Spent in minutes  35   Shataria Crist K M.D on 12/27/2014 at 3:56 PM  Between 7am to 7pm - Pager - [phone]  After 7pm go to [email or web address] - password [facility]  [facility] -  Office  [phone]

## 2014-12-28 LAB — RENAL FUNCTION PANEL
Albumin: 2.5 g/dL — ABNORMAL LOW (ref 3.5–5.0)
Anion gap: 11 (ref 5–15)
BUN: 25 mg/dL — ABNORMAL HIGH (ref 6–20)
CO2: 26 mmol/L (ref 22–32)
Calcium: 9.3 mg/dL (ref 8.9–10.3)
Chloride: 99 mmol/L — ABNORMAL LOW (ref 101–111)
Creatinine, Ser: 6.41 mg/dL — ABNORMAL HIGH (ref 0.44–1.00)
GFR calc Af Amer: 9 mL/min — ABNORMAL LOW (ref >60)
GFR calc non Af Amer: 8 mL/min — ABNORMAL LOW (ref >60)
Glucose, Bld: 104 mg/dL — ABNORMAL HIGH (ref 65–99)
Phosphorus: 6 mg/dL — ABNORMAL HIGH (ref 2.5–4.6)
Potassium: 3.8 mmol/L (ref 3.5–5.1)
Sodium: 136 mmol/L (ref 135–145)

## 2014-12-28 LAB — CBC
HCT: 30.4 % — ABNORMAL LOW (ref 36.0–46.0)
Hemoglobin: 9.4 g/dL — ABNORMAL LOW (ref 12.0–15.0)
MCH: 27.4 pg (ref 26.0–34.0)
MCHC: 30.9 g/dL (ref 30.0–36.0)
MCV: 88.6 fL (ref 78.0–100.0)
Platelets: 171 K/uL (ref 150–400)
RBC: 3.43 MIL/uL — ABNORMAL LOW (ref 3.87–5.11)
RDW: 16.5 % — ABNORMAL HIGH (ref 11.5–15.5)
WBC: 4.1 K/uL (ref 4.0–10.5)

## 2014-12-28 MED ORDER — HEPARIN SODIUM (PORCINE) 1000 UNIT/ML DIALYSIS: 1000.0000 [IU] | INTRAMUSCULAR | Status: DC | PRN

## 2014-12-28 MED ORDER — SODIUM CHLORIDE 0.9 % IV SOLN: 125.0000 mg | Freq: Once | INTRAVENOUS | Status: DC

## 2014-12-28 MED ORDER — DARBEPOETIN ALFA 200 MCG/0.4ML IJ SOSY
PREFILLED_SYRINGE | INTRAMUSCULAR | Status: AC
Start: 2014-12-28 — End: 2014-12-28
  Filled 2014-12-28: qty 0.4

## 2014-12-28 MED ORDER — GUAIFENESIN-CODEINE 100-10 MG/5ML PO SOLN: 10.0000 mL | ORAL | Status: DC | PRN

## 2014-12-28 MED ORDER — METOPROLOL TARTRATE 50 MG PO TABS: 50.0000 mg | ORAL_TABLET | Freq: Two times a day (BID) | ORAL | Status: DC

## 2014-12-28 MED ORDER — SODIUM CHLORIDE 0.9 % IV SOLN: 100.0000 mL | INTRAVENOUS | Status: DC | PRN

## 2014-12-28 MED ORDER — DOXYCYCLINE HYCLATE 100 MG PO TABS
100.0000 mg | ORAL_TABLET | Freq: Two times a day (BID) | ORAL | Status: DC
  Administered 2014-12-28: 100 mg via ORAL
  Filled 2014-12-28: qty 1

## 2014-12-28 MED ORDER — LIDOCAINE-PRILOCAINE 2.5-2.5 % EX CREA
1.0000 "application " | TOPICAL_CREAM | CUTANEOUS | Status: DC | PRN
  Filled 2014-12-28: qty 5

## 2014-12-28 MED ORDER — LIDOCAINE HCL (PF) 1 % IJ SOLN: 5.0000 mL | INTRAMUSCULAR | Status: DC | PRN

## 2014-12-28 MED ORDER — PENTAFLUOROPROP-TETRAFLUOROETH EX AERO: 1.0000 "application " | INHALATION_SPRAY | CUTANEOUS | Status: DC | PRN

## 2014-12-28 MED ORDER — DOXYCYCLINE HYCLATE 100 MG PO TABS: 100.0000 mg | ORAL_TABLET | Freq: Two times a day (BID) | ORAL | Status: DC

## 2014-12-28 MED ORDER — GUAIFENESIN-CODEINE 100-10 MG/5ML PO SOLN
10.0000 mL | Freq: Once | ORAL | Status: AC
  Administered 2014-12-28: 10 mL via ORAL
  Filled 2014-12-28: qty 10

## 2014-12-28 MED ORDER — ALTEPLASE 2 MG IJ SOLR
2.0000 mg | Freq: Once | INTRAMUSCULAR | Status: DC | PRN
Start: 2014-12-28 — End: 2014-12-28
  Filled 2014-12-28: qty 2

## 2014-12-28 NOTE — Progress Notes (Deleted)
[facility] Progress Note  Assessment/Plan: 1. Cough/Congestion: Per primary. Favoring volume overload. Cough improved, still c/o chest pain. 2. Pleuritic Pain VS CVA pain:CVA tenderness revolved. No fevers in past 24 hours 3. ESRD - Has HD 3 times weekly at [facility]. Schedule is erratic but does show up for treatments. Trying to get into HD center but so far no one has accepted her. Has had HD for past two days. Will have HD today to resume TTS Schedule. K+ 3.3. 4.0 K bath. 4. Hypertension/volume - HD yesterday Net UF 5000. LE edema improved, post wt 58.7 kg. Believe we are approaching euvolemic state. Will attempt 3-4 liters today.  5. Anemia - Last HGB 7.9 (12/26/14). Will check prior to HD. /Nulicet with HD.  6. Metabolic bone disease - Phos 11.0 Ca 9.0 C Ca 10.2 change to 2.0K 2.0 Ca bath. Doubt pt will take binders at home but will start renvela.  7. Nutrition - Renal diet with fld restrictions. Rita H. Brown NP-C 12/28/2014, 8:57 AM  [facility] [phone]  Subjective:   "My chest still hurts and I'm still coughing but I can't bring anything up". Looks much better.   Objective Filed Vitals:   12/27/14 1629 12/27/14 1845 12/27/14 2348 12/28/14 0624  BP: 148/95 125/89 125/89 137/95  Pulse: 110 98 100 100  Temp: 98.6 F (37 C) 98.7 F (37.1 C) 98.6 F (37 C) 98.9 F (37.2 C)  TempSrc: Oral Oral Oral Oral  Resp: 20 18 16 16   Weight:      SpO2: 100% 100% 100% 100%    Physical Exam General: chronically ill appearing female NAD Heart:S1 S2 RRR. NO R/MG Lungs: Bilateral breath sounds CTA A/P. No WOB, no pleural rub.  Abdomen: Active BS, soft, nondistended Extremities: LE edema resolved.  Dialysis Access:LUA AVF + Bruit  Dialysis Orders: Center: [facility] On variable days when she shows up for treatments.  EDW 64 kg HD Bath 2.0K 2.25 Ca Time 4 hours Heparin No Heparin. Access LUA AVF BFR 400 DFR 800  Receives Aranesp  200 mcg IV weekly, Nulecit 125 mg IV per treatment Additional Objective Labs: Basic Metabolic Panel:  Recent Labs Lab 12/26/14 1715 12/26/14 2244 12/27/14 0440  NA 137 137 137  K 4.3 4.5 3.3*  CL 98* 98* 100*  CO2 23 22 27   GLUCOSE 107* 104* 83  BUN 66* 70* 24*  CREATININE 12.62* 12.87* 6.34*  CALCIUM 9.6 9.1 9.0  PHOS  --  11.0*  --    Liver Function Tests:  Recent Labs Lab 12/26/14 2244 12/27/14 0440  AST  --  20  ALT  --  15  ALKPHOS  --  83  BILITOT  --  0.6  PROT  --  7.4  ALBUMIN 2.6* 2.7*   No results for input(s): LIPASE, AMYLASE in the last 168 hours. CBC:  Recent Labs Lab 12/23/14 1206 12/26/14 1715 12/26/14 2244  WBC  --  4.3 4.0  NEUTROABS  --  2.7  --   HGB 9.9* 8.6* 7.9*  HCT 29.0* 27.9* 25.1*  MCV  --  87.5 87.5  PLT  --  215 192   Blood Culture    Component Value Date/Time   SDES URINE, CLEAN CATCH 12/26/2014 2246   SPECREQUEST NONE 12/26/2014 2246   CULT MULTIPLE SPECIES PRESENT, SUGGEST RECOLLECTION 12/26/2014 2246   REPTSTATUS 12/27/2014 FINAL 12/26/2014 2246    Cardiac Enzymes: No results for input(s): CKTOTAL, CKMB, CKMBINDEX, TROPONINI in the last 168 hours.  CBG: No results for input(s): GLUCAP in the last 168 hours. Iron Studies: No results for input(s): IRON, TIBC, TRANSFERRIN, FERRITIN in the last 72 hours. @lablastinr3 @ Studies/Results: Dg Chest 2 View  12/26/2014   CLINICAL DATA:  Cough and chest pain.  EXAM: CHEST  2 VIEW  COMPARISON:  November 30, 2014.  FINDINGS: Stable cardiomegaly with central pulmonary vascular congestion is noted. Mild bilateral pleural effusions are noted. No pneumothorax is noted. Bony thorax is unremarkable. Mild bibasilar subsegmental atelectasis is noted.  IMPRESSION: Stable cardiomegaly with central pulmonary vascular congestion is noted consistent with congestive heart failure. Mild bilateral pleural effusions are noted which are slightly improved compared to prior exam. Probable mild bibasilar  subsegmental atelectasis is noted.   Electronically Signed   By: Marijo Conception, M.D.   On: 12/26/2014 14:02   Medications:   . [START ON 01/02/2015] darbepoetin (ARANESP) injection - DIALYSIS  200 mcg Intravenous Q Tue-HD  . doxycycline  100 mg Oral Q12H  . enoxaparin (LOVENOX) injection  30 mg Subcutaneous Q24H  . ferric gluconate (FERRLECIT/NULECIT) IV  125 mg Intravenous Q T,Th,Sa-HD  . guaiFENesin-codeine  10 mL Oral Once  . metoprolol tartrate  50 mg Oral BID  . multivitamin  1 tablet Oral QHS  . sevelamer carbonate  1,600 mg Oral TID WC  . sodium chloride  3 mL Intravenous Q12H  . triamcinolone   Topical BID

## 2014-12-28 NOTE — Progress Notes (Signed)
Assessment/Plan: 1. Cough/Congestion: Per primary. Favoring volume overload. Cough improved, still c/o chest pain. 2. Pleuritic Pain VS CVA pain:CVA tenderness revolved. No fevers in past 24 hours 3. ESRD - Has had HD more or less 3 times weekly at [facility]. Schedule is erratic but does show up for treatments. Trying to get into HD center but so far no one has accepted her. Has had HD for past two days. K+ 3.3. 4.0 K bath. 4. Hypertension/volume - HD yesterday Net UF 5000. LE edema improved, post wt 58.7 kg. Believe we are approaching euvolemic state. Will attempt 3-4 liters today.  5. Anemia - Last HGB 7.9 (12/26/14). Will check prior to HD. /Nulicet with HD.  6. Metabolic bone disease - Phos 11.0 Ca 9.0 C Ca 10.2 change to 2.0K 2.0 Ca bath. Doubt pt will take binders at home but will start renvela.  7. Nutrition - Renal diet with fld restrictions.  Rita H. Brown NP-C 12/28/2014, 8:57 AM  [facility] [phone]  Pt seen, examined and agree w A/P as above. Ok for [employer] after HD today.  Kelly Splinter MD pager [phone]    cell [phone] 12/28/2014, 12:36 PM    Subjective: "My chest still hurts and I'm still coughing but I can't bring anything up". Looks much better.   Objective Filed Vitals:   12/27/14 1629 12/27/14 1845 12/27/14 2348 12/28/14 0624  BP: 148/95 125/89 125/89 137/95  Pulse: 110 98 100 100  Temp: 98.6 F (37 C) 98.7 F (37.1 C) 98.6 F (37 C) 98.9 F (37.2 C)  TempSrc: Oral Oral Oral Oral  Resp: 20 18 16 16   Weight:      SpO2: 100% 100% 100% 100%    Physical Exam General: chronically ill appearing female NAD Heart:S1 S2 RRR. NO R/MG Lungs: Bilateral breath sounds CTA A/P. No WOB, no pleural rub.  Abdomen: Active BS, soft, nondistended Extremities: LE edema resolved.  Dialysis Access:LUA AVF + Bruit  Dialysis Orders: Center: [facility] On variable days when she shows up for treatments.   EDW 64 kg HD Bath 2.0K 2.25 Ca Time 4 hours Heparin No Heparin. Access LUA AVF BFR 400 DFR 800  Receives Aranesp 200 mcg IV weekly, Nulecit 125 mg IV per treatment Additional Objective Labs: Basic Metabolic Panel:  Last Labs      Recent Labs Lab 12/26/14 1715 12/26/14 2244 12/27/14 0440  NA 137 137 137  K 4.3 4.5 3.3*  CL 98* 98* 100*  CO2 23 22 27   GLUCOSE 107* 104* 83  BUN 66* 70* 24*  CREATININE 12.62* 12.87* 6.34*  CALCIUM 9.6 9.1 9.0  PHOS --  11.0* --      Liver Function Tests:  Last Labs      Recent Labs Lab 12/26/14 2244 12/27/14 0440  AST --  20  ALT --  15  ALKPHOS --  83  BILITOT --  0.6  PROT --  7.4  ALBUMIN 2.6* 2.7*      Last Labs     No results for input(s): LIPASE, AMYLASE in the last 168 hours.   CBC:  Last Labs      Recent Labs Lab 12/23/14 1206 12/26/14 1715 12/26/14 2244  WBC --  4.3 4.0  NEUTROABS --  2.7 --   HGB 9.9* 8.6* 7.9*  HCT 29.0* 27.9* 25.1*  MCV --  87.5 87.5  PLT --  215 192     Blood Culture  Labs (Brief)       Component  Value Date/Time   SDES URINE, CLEAN CATCH 12/26/2014 2246   SPECREQUEST NONE 12/26/2014 2246   CULT MULTIPLE SPECIES PRESENT, SUGGEST RECOLLECTION 12/26/2014 2246   REPTSTATUS 12/27/2014 FINAL 12/26/2014 2246      Cardiac Enzymes:  Last Labs     No results for input(s): CKTOTAL, CKMB, CKMBINDEX, TROPONINI in the last 168 hours.   CBG:  Last Labs     No results for input(s): GLUCAP in the last 168 hours.   Iron Studies:    Recent Labs (last 2 labs)     No results for input(s): IRON, TIBC, TRANSFERRIN, FERRITIN in the last 72 hours.   @lablastinr3 @ Studies/Results:  Imaging Results (Last 48 hours)    Dg Chest 2 View  12/26/2014 CLINICAL DATA: Cough and chest pain. EXAM: CHEST 2 VIEW COMPARISON: November 30, 2014. FINDINGS: Stable cardiomegaly with  central pulmonary vascular congestion is noted. Mild bilateral pleural effusions are noted. No pneumothorax is noted. Bony thorax is unremarkable. Mild bibasilar subsegmental atelectasis is noted. IMPRESSION: Stable cardiomegaly with central pulmonary vascular congestion is noted consistent with congestive heart failure. Mild bilateral pleural effusions are noted which are slightly improved compared to prior exam. Probable mild bibasilar subsegmental atelectasis is noted. Electronically Signed By: Marijo Conception, M.D. On: 12/26/2014 14:02    Medications:   . [START ON 01/02/2015] darbepoetin (ARANESP) injection - DIALYSIS 200 mcg Intravenous Q Tue-HD  . doxycycline 100 mg Oral Q12H  . enoxaparin (LOVENOX) injection 30 mg Subcutaneous Q24H  . ferric gluconate (FERRLECIT/NULECIT) IV 125 mg Intravenous Q T,Th,Sa-HD  . guaiFENesin-codeine 10 mL Oral Once  . metoprolol tartrate 50 mg Oral BID  . multivitamin 1 tablet Oral QHS  . sevelamer carbonate 1,600 mg Oral TID WC  . sodium chloride 3 mL Intravenous Q12H  . triamcinolone  Topical BID

## 2014-12-28 NOTE — Progress Notes (Signed)
Patient is discharged from room 6E14 at this time. Alert and in stable condition. IV site d/c'd as well as tele. Instructions read to patient and understanding verbalized. Left unit via wheelchair with all belongings at side.

## 2014-12-30 ENCOUNTER — Non-Acute Institutional Stay (HOSPITAL_COMMUNITY)
Admission: EM | Admit: 2014-12-30 | Discharge: 2014-12-30 | Disposition: A | Discharge status: Home / Self Care | Payer: Medicare Other | Attending: Emergency Medicine | Admitting: Emergency Medicine

## 2014-12-30 ENCOUNTER — Encounter (HOSPITAL_COMMUNITY): Payer: Self-pay | Admitting: Emergency Medicine

## 2014-12-30 DIAGNOSIS — I509 Heart failure, unspecified: Secondary | ICD-10-CM | POA: Diagnosis not present

## 2014-12-30 DIAGNOSIS — N186 End stage renal disease: Secondary | ICD-10-CM | POA: Insufficient documentation

## 2014-12-30 DIAGNOSIS — M3214 Glomerular disease in systemic lupus erythematosus: Secondary | ICD-10-CM | POA: Insufficient documentation

## 2014-12-30 DIAGNOSIS — Z79899 Other long term (current) drug therapy: Secondary | ICD-10-CM | POA: Diagnosis not present

## 2014-12-30 DIAGNOSIS — Z992 Dependence on renal dialysis: Secondary | ICD-10-CM | POA: Insufficient documentation

## 2014-12-30 DIAGNOSIS — F319 Bipolar disorder, unspecified: Secondary | ICD-10-CM | POA: Insufficient documentation

## 2014-12-30 DIAGNOSIS — R05 Cough: Secondary | ICD-10-CM | POA: Insufficient documentation

## 2014-12-30 DIAGNOSIS — F17210 Nicotine dependence, cigarettes, uncomplicated: Secondary | ICD-10-CM | POA: Diagnosis not present

## 2014-12-30 DIAGNOSIS — F209 Schizophrenia, unspecified: Secondary | ICD-10-CM | POA: Diagnosis not present

## 2014-12-30 DIAGNOSIS — I120 Hypertensive chronic kidney disease with stage 5 chronic kidney disease or end stage renal disease: Secondary | ICD-10-CM | POA: Insufficient documentation

## 2014-12-30 DIAGNOSIS — D649 Anemia, unspecified: Secondary | ICD-10-CM | POA: Insufficient documentation

## 2014-12-30 LAB — I-STAT CHEM 8, ED
BUN: 53 mg/dL — AB (ref 6–20)
CHLORIDE: 100 mmol/L — AB (ref 101–111)
CREATININE: 9.6 mg/dL — AB (ref 0.44–1.00)
Calcium, Ion: 1.11 mmol/L — ABNORMAL LOW (ref 1.12–1.23)
Glucose, Bld: 82 mg/dL (ref 65–99)
HEMATOCRIT: 35 % — AB (ref 36.0–46.0)
Hemoglobin: 11.9 g/dL — ABNORMAL LOW (ref 12.0–15.0)
Potassium: 4.9 mmol/L (ref 3.5–5.1)
SODIUM: 136 mmol/L (ref 135–145)
TCO2: 24 mmol/L (ref 0–100)

## 2014-12-30 MED ORDER — HEPARIN SODIUM (PORCINE) 1000 UNIT/ML DIALYSIS: 5000.0000 [IU] | Freq: Once | INTRAMUSCULAR | Status: DC

## 2014-12-30 MED ORDER — SODIUM CHLORIDE 0.9 % IV SOLN: 100.0000 mL | INTRAVENOUS | Status: DC | PRN

## 2014-12-30 MED ORDER — HYDROXYZINE HCL 25 MG PO TABS
ORAL_TABLET | ORAL | Status: AC
  Administered 2014-12-30: 25 mg via ORAL
  Filled 2014-12-30: qty 1

## 2014-12-30 MED ORDER — LIDOCAINE HCL (PF) 1 % IJ SOLN: 5.0000 mL | INTRAMUSCULAR | Status: DC | PRN

## 2014-12-30 MED ORDER — HEPARIN SODIUM (PORCINE) 1000 UNIT/ML DIALYSIS
5000.0000 [IU] | Freq: Once | INTRAMUSCULAR | Status: DC
  Filled 2014-12-30: qty 5

## 2014-12-30 MED ORDER — PENTAFLUOROPROP-TETRAFLUOROETH EX AERO: 1.0000 "application " | INHALATION_SPRAY | CUTANEOUS | Status: DC | PRN

## 2014-12-30 MED ORDER — PENTAFLUOROPROP-TETRAFLUOROETH EX AERO
1.0000 | INHALATION_SPRAY | CUTANEOUS | Status: DC | PRN
Start: 2014-12-30 — End: 2014-12-30

## 2014-12-30 MED ORDER — LIDOCAINE-PRILOCAINE 2.5-2.5 % EX CREA: 1.0000 "application " | TOPICAL_CREAM | CUTANEOUS | Status: DC | PRN

## 2014-12-30 MED ORDER — ALTEPLASE 2 MG IJ SOLR: 2.0000 mg | Freq: Once | INTRAMUSCULAR | Status: DC | PRN

## 2014-12-30 MED ORDER — LIDOCAINE-PRILOCAINE 2.5-2.5 % EX CREA
1.0000 "application " | TOPICAL_CREAM | CUTANEOUS | Status: DC | PRN
  Filled 2014-12-30: qty 5

## 2014-12-30 MED ORDER — ACETAMINOPHEN 325 MG PO TABS
650.0000 mg | ORAL_TABLET | Freq: Once | ORAL | Status: AC
  Administered 2014-12-30: 650 mg via ORAL

## 2014-12-30 MED ORDER — ALTEPLASE 2 MG IJ SOLR
2.0000 mg | Freq: Once | INTRAMUSCULAR | Status: DC | PRN
  Filled 2014-12-30: qty 2

## 2014-12-30 MED ORDER — HYDROXYZINE HCL 25 MG PO TABS
25.0000 mg | ORAL_TABLET | Freq: Once | ORAL | Status: AC
  Administered 2014-12-30: 25 mg via ORAL

## 2014-12-30 MED ORDER — HEPARIN SODIUM (PORCINE) 1000 UNIT/ML DIALYSIS: 1000.0000 [IU] | INTRAMUSCULAR | Status: DC | PRN

## 2014-12-30 MED ORDER — ACETAMINOPHEN 325 MG PO TABS
ORAL_TABLET | ORAL | Status: AC
  Administered 2014-12-30: 650 mg via ORAL
  Filled 2014-12-30: qty 2

## 2014-12-30 NOTE — ED Notes (Signed)
Regular Meal tray ordered 1423

## 2014-12-30 NOTE — ED Notes (Signed)
Here for dialysis
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
## 2014-12-31 LAB — HEPATITIS B SURFACE ANTIGEN: HEP B S AG: NEGATIVE

## 2015-01-01 ENCOUNTER — Other Ambulatory Visit: Payer: Self-pay | Admitting: *Deleted

## 2015-01-01 MED ORDER — METOPROLOL TARTRATE 50 MG PO TABS: 50.0000 mg | ORAL_TABLET | Freq: Two times a day (BID) | ORAL | Status: DC

## 2015-01-01 NOTE — Telephone Encounter (Signed)
Please call patient to see if we can make a hospital follow up appointment. She was discharged on 9/21 and was told to make an appointment with [facility].   Per chart review, Imdur was last prescribed in 05/2014 for 30 tabs without refills. Imdur was not a medication she was discharged with during recent hospitalization ([facility]). Patient needs to be seen and evaluated before restarting this medication.   I will refill her Metoprolol.

## 2015-01-02 ENCOUNTER — Encounter (HOSPITAL_COMMUNITY): Payer: Self-pay | Admitting: Emergency Medicine

## 2015-01-02 ENCOUNTER — Ambulatory Visit: Payer: Self-pay | Admitting: Internal Medicine

## 2015-01-02 ENCOUNTER — Emergency Department (HOSPITAL_COMMUNITY)
Admission: EM | Admit: 2015-01-02 | Discharge: 2015-01-03 | Disposition: A | Discharge status: Home / Self Care | Payer: Medicare Other | Attending: Emergency Medicine | Admitting: Emergency Medicine

## 2015-01-02 DIAGNOSIS — R05 Cough: Secondary | ICD-10-CM

## 2015-01-02 DIAGNOSIS — Z862 Personal history of diseases of the blood and blood-forming organs and certain disorders involving the immune mechanism: Secondary | ICD-10-CM | POA: Insufficient documentation

## 2015-01-02 DIAGNOSIS — T8249XA Other complication of vascular dialysis catheter, initial encounter: Secondary | ICD-10-CM | POA: Diagnosis not present

## 2015-01-02 DIAGNOSIS — I509 Heart failure, unspecified: Secondary | ICD-10-CM | POA: Diagnosis not present

## 2015-01-02 DIAGNOSIS — Z720 Tobacco use: Secondary | ICD-10-CM | POA: Insufficient documentation

## 2015-01-02 DIAGNOSIS — R079 Chest pain, unspecified: Secondary | ICD-10-CM | POA: Insufficient documentation

## 2015-01-02 DIAGNOSIS — F29 Unspecified psychosis not due to a substance or known physiological condition: Secondary | ICD-10-CM | POA: Insufficient documentation

## 2015-01-02 DIAGNOSIS — N186 End stage renal disease: Secondary | ICD-10-CM

## 2015-01-02 DIAGNOSIS — I120 Hypertensive chronic kidney disease with stage 5 chronic kidney disease or end stage renal disease: Secondary | ICD-10-CM | POA: Diagnosis not present

## 2015-01-02 DIAGNOSIS — R0789 Other chest pain: Secondary | ICD-10-CM | POA: Diagnosis not present

## 2015-01-02 DIAGNOSIS — Z992 Dependence on renal dialysis: Secondary | ICD-10-CM | POA: Insufficient documentation

## 2015-01-02 DIAGNOSIS — Z79899 Other long term (current) drug therapy: Secondary | ICD-10-CM | POA: Diagnosis not present

## 2015-01-02 DIAGNOSIS — R059 Cough, unspecified: Secondary | ICD-10-CM

## 2015-01-02 LAB — CBC
HCT: 27.8 % — ABNORMAL LOW (ref 36.0–46.0)
Hemoglobin: 8.9 g/dL — ABNORMAL LOW (ref 12.0–15.0)
MCH: 28.9 pg (ref 26.0–34.0)
MCHC: 32 g/dL (ref 30.0–36.0)
MCV: 90.3 fL (ref 78.0–100.0)
PLATELETS: 195 10*3/uL (ref 150–400)
RBC: 3.08 MIL/uL — AB (ref 3.87–5.11)
RDW: 17.3 % — AB (ref 11.5–15.5)
WBC: 5.7 10*3/uL (ref 4.0–10.5)

## 2015-01-02 LAB — IRON AND TIBC
IRON: 28 ug/dL (ref 28–170)
Saturation Ratios: 12 % (ref 10.4–31.8)
TIBC: 225 ug/dL — ABNORMAL LOW (ref 250–450)
UIBC: 197 ug/dL

## 2015-01-02 LAB — RENAL FUNCTION PANEL
Albumin: 2.6 g/dL — ABNORMAL LOW (ref 3.5–5.0)
Anion gap: 14 (ref 5–15)
BUN: 77 mg/dL — AB (ref 6–20)
CALCIUM: 8.9 mg/dL (ref 8.9–10.3)
CO2: 24 mmol/L (ref 22–32)
CREATININE: 11.48 mg/dL — AB (ref 0.44–1.00)
Chloride: 95 mmol/L — ABNORMAL LOW (ref 101–111)
GFR calc non Af Amer: 4 mL/min — ABNORMAL LOW (ref >60)
GFR, EST AFRICAN AMERICAN: 4 mL/min — AB (ref >60)
GLUCOSE: 104 mg/dL — AB (ref 65–99)
Phosphorus: 11.9 mg/dL — ABNORMAL HIGH (ref 2.5–4.6)
Potassium: 5.9 mmol/L — ABNORMAL HIGH (ref 3.5–5.1)
SODIUM: 133 mmol/L — AB (ref 135–145)

## 2015-01-02 LAB — FERRITIN: Ferritin: 904 ng/mL — ABNORMAL HIGH (ref 11–307)

## 2015-01-02 MED ORDER — DARBEPOETIN ALFA 100 MCG/0.5ML IJ SOSY
100.0000 ug | PREFILLED_SYRINGE | Freq: Once | INTRAMUSCULAR | Status: AC
  Administered 2015-01-02: 100 ug via INTRAVENOUS

## 2015-01-02 MED ORDER — DARBEPOETIN ALFA 100 MCG/0.5ML IJ SOSY
PREFILLED_SYRINGE | INTRAMUSCULAR | Status: AC
  Administered 2015-01-02: 100 ug via INTRAVENOUS
  Filled 2015-01-02: qty 0.5

## 2015-01-02 MED ORDER — HYDROCODONE-ACETAMINOPHEN 5-325 MG PO TABS
1.0000 | ORAL_TABLET | Freq: Once | ORAL | Status: AC
  Administered 2015-01-02: 1 via ORAL
  Filled 2015-01-02: qty 1

## 2015-01-02 MED ORDER — HEPARIN SODIUM (PORCINE) 1000 UNIT/ML IJ SOLN
6000.0000 [IU] | Freq: Once | INTRAMUSCULAR | Status: AC
  Administered 2015-01-02: 6000 [IU] via INTRAVENOUS

## 2015-01-02 NOTE — Progress Notes (Signed)
Pt left unit on own, stable, all belongs with pt.

## 2015-01-02 NOTE — ED Notes (Signed)
PT not complaining of any issues; reports she is out of her cough medicine and needs more. Here for routine dialysis.

## 2015-01-02 NOTE — ED Notes (Signed)
Ordered diet tray 
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
## 2015-01-02 NOTE — ED Notes (Signed)
Dialysis informed pt is here.

## 2015-01-02 NOTE — ED Notes (Signed)
Pt here for routine dialysis; pt sts some body aches and cough per norm

## 2015-01-02 NOTE — Progress Notes (Signed)
Pt off tx 1 hr early AMA signed paperwork after discussing risks

## 2015-01-03 ENCOUNTER — Telehealth: Payer: Self-pay | Admitting: Internal Medicine

## 2015-01-03 MED ORDER — TRIAMCINOLONE ACETONIDE 0.025 % EX OINT: 1.0000 "application " | TOPICAL_OINTMENT | Freq: Two times a day (BID) | CUTANEOUS | Status: DC

## 2015-01-03 NOTE — Telephone Encounter (Signed)
Refill request from pt. Will forward to PCP for review. Adams,Latoya, CMA. 

## 2015-01-03 NOTE — Telephone Encounter (Signed)
Pt called and would like a refill on her Kenalog and Guaifenesin-codeine called in. Please call patient when this is done so she knows to go pick this up. jw

## 2015-01-03 NOTE — Telephone Encounter (Signed)
I will refill the triamcinolone cream for her rash. She was seen by Dr. Sherilyn Cooter for rash on 9/7; she was given cream and was given a referral to dermatology. Please make sure patient is aware of this referral.   I am not comfortable refilling guaifenesin with codeine as I did not initially prescribe this medication for her (prescribed by ED). Additionally, she missed her ED follow up appointment this week.

## 2015-01-03 NOTE — Telephone Encounter (Signed)
Pt calling once more about this request. Would like to reiterate that she needs a call when it has been sent. Sadie Reynolds, ASA

## 2015-01-04 ENCOUNTER — Encounter (HOSPITAL_COMMUNITY): Payer: Self-pay | Admitting: Emergency Medicine

## 2015-01-04 ENCOUNTER — Emergency Department (HOSPITAL_COMMUNITY)
Admission: EM | Admit: 2015-01-04 | Discharge: 2015-01-04 | Disposition: A | Discharge status: Home / Self Care | Payer: Medicare Other | Attending: Emergency Medicine | Admitting: Emergency Medicine

## 2015-01-04 DIAGNOSIS — R05 Cough: Secondary | ICD-10-CM | POA: Diagnosis not present

## 2015-01-04 DIAGNOSIS — Z79899 Other long term (current) drug therapy: Secondary | ICD-10-CM | POA: Insufficient documentation

## 2015-01-04 DIAGNOSIS — I120 Hypertensive chronic kidney disease with stage 5 chronic kidney disease or end stage renal disease: Secondary | ICD-10-CM | POA: Insufficient documentation

## 2015-01-04 DIAGNOSIS — N186 End stage renal disease: Secondary | ICD-10-CM | POA: Insufficient documentation

## 2015-01-04 DIAGNOSIS — Z8739 Personal history of other diseases of the musculoskeletal system and connective tissue: Secondary | ICD-10-CM | POA: Diagnosis not present

## 2015-01-04 DIAGNOSIS — Z7952 Long term (current) use of systemic steroids: Secondary | ICD-10-CM | POA: Diagnosis not present

## 2015-01-04 DIAGNOSIS — R059 Cough, unspecified: Secondary | ICD-10-CM

## 2015-01-04 DIAGNOSIS — Z4932 Encounter for adequacy testing for peritoneal dialysis: Secondary | ICD-10-CM | POA: Diagnosis not present

## 2015-01-04 DIAGNOSIS — I509 Heart failure, unspecified: Secondary | ICD-10-CM | POA: Insufficient documentation

## 2015-01-04 DIAGNOSIS — Z8659 Personal history of other mental and behavioral disorders: Secondary | ICD-10-CM | POA: Diagnosis not present

## 2015-01-04 DIAGNOSIS — Z862 Personal history of diseases of the blood and blood-forming organs and certain disorders involving the immune mechanism: Secondary | ICD-10-CM | POA: Insufficient documentation

## 2015-01-04 DIAGNOSIS — R635 Abnormal weight gain: Secondary | ICD-10-CM | POA: Diagnosis not present

## 2015-01-04 DIAGNOSIS — Z792 Long term (current) use of antibiotics: Secondary | ICD-10-CM | POA: Insufficient documentation

## 2015-01-04 DIAGNOSIS — Z720 Tobacco use: Secondary | ICD-10-CM | POA: Diagnosis not present

## 2015-01-04 LAB — I-STAT CHEM 8, ED
BUN: 76 mg/dL — ABNORMAL HIGH (ref 6–20)
CREATININE: 11.1 mg/dL — AB (ref 0.44–1.00)
Calcium, Ion: 1.09 mmol/L — ABNORMAL LOW (ref 1.12–1.23)
Chloride: 103 mmol/L (ref 101–111)
Glucose, Bld: 101 mg/dL — ABNORMAL HIGH (ref 65–99)
HEMATOCRIT: 31 % — AB (ref 36.0–46.0)
HEMOGLOBIN: 10.5 g/dL — AB (ref 12.0–15.0)
POTASSIUM: 5.7 mmol/L — AB (ref 3.5–5.1)
Sodium: 138 mmol/L (ref 135–145)
TCO2: 25 mmol/L (ref 0–100)

## 2015-01-04 LAB — RENAL FUNCTION PANEL
Albumin: 2.8 g/dL — ABNORMAL LOW (ref 3.5–5.0)
Anion gap: 15 (ref 5–15)
BUN: 79 mg/dL — AB (ref 6–20)
CALCIUM: 9.1 mg/dL (ref 8.9–10.3)
CHLORIDE: 99 mmol/L — AB (ref 101–111)
CO2: 25 mmol/L (ref 22–32)
CREATININE: 11.25 mg/dL — AB (ref 0.44–1.00)
GFR, EST AFRICAN AMERICAN: 5 mL/min — AB (ref >60)
GFR, EST NON AFRICAN AMERICAN: 4 mL/min — AB (ref >60)
Glucose, Bld: 101 mg/dL — ABNORMAL HIGH (ref 65–99)
Phosphorus: 12.7 mg/dL — ABNORMAL HIGH (ref 2.5–4.6)
Potassium: 5.8 mmol/L — ABNORMAL HIGH (ref 3.5–5.1)
SODIUM: 139 mmol/L (ref 135–145)

## 2015-01-04 MED ORDER — SODIUM POLYSTYRENE SULFONATE 15 GM/60ML PO SUSP
15.0000 g | Freq: Once | ORAL | Status: AC
  Administered 2015-01-04: 15 g via ORAL
  Filled 2015-01-04: qty 60

## 2015-01-04 MED ORDER — GUAIFENESIN-CODEINE 100-10 MG/5ML PO SOLN: 10.0000 mL | ORAL | Status: DC | PRN

## 2015-01-04 NOTE — Discharge Instructions (Signed)
Take cough medicine as needed.  Come tomorrow for dialysis.   See your doctor.   Return to ER if you have worse chest pain, cough, shortness of breath.

## 2015-01-04 NOTE — ED Notes (Signed)
Pt here for routine dialysis; last dialysis was Tuesday; pt sts hx of recent cough

## 2015-01-04 NOTE — Telephone Encounter (Signed)
Pt returned call. Informed of below. Pt states that she did make her derm appt and she will have to undergo treatment and that treatment will not start until next week. Pt very displeased about the codeine rx and states she will go to the "ER doctor" to have it refilled as she states that she was unaware of her appt on Tues. Sadie Reynolds, ASA
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
## 2015-01-04 NOTE — Telephone Encounter (Signed)
If pt calls back please advise as directed below. Adams,Latoya, CMA.

## 2015-01-04 NOTE — Telephone Encounter (Signed)
LMTCB, will try again later. Adams,Latoya, CMA. 

## 2015-01-04 NOTE — Telephone Encounter (Signed)
Will forward to PCP for review. Adams,Latoya, CMA. 

## 2015-01-05 ENCOUNTER — Emergency Department (HOSPITAL_COMMUNITY)
Admission: EM | Admit: 2015-01-05 | Discharge: 2015-01-05 | Disposition: A | Discharge status: Home / Self Care | Payer: Medicare Other | Attending: Emergency Medicine | Admitting: Emergency Medicine

## 2015-01-05 ENCOUNTER — Encounter (HOSPITAL_COMMUNITY): Payer: Self-pay | Admitting: Emergency Medicine

## 2015-01-05 DIAGNOSIS — Z992 Dependence on renal dialysis: Secondary | ICD-10-CM | POA: Insufficient documentation

## 2015-01-05 DIAGNOSIS — I509 Heart failure, unspecified: Secondary | ICD-10-CM | POA: Diagnosis not present

## 2015-01-05 DIAGNOSIS — F17210 Nicotine dependence, cigarettes, uncomplicated: Secondary | ICD-10-CM | POA: Insufficient documentation

## 2015-01-05 DIAGNOSIS — M329 Systemic lupus erythematosus, unspecified: Secondary | ICD-10-CM | POA: Diagnosis not present

## 2015-01-05 DIAGNOSIS — Z9115 Patient's noncompliance with renal dialysis: Secondary | ICD-10-CM | POA: Diagnosis not present

## 2015-01-05 DIAGNOSIS — I120 Hypertensive chronic kidney disease with stage 5 chronic kidney disease or end stage renal disease: Secondary | ICD-10-CM | POA: Diagnosis not present

## 2015-01-05 DIAGNOSIS — F209 Schizophrenia, unspecified: Secondary | ICD-10-CM | POA: Insufficient documentation

## 2015-01-05 DIAGNOSIS — N186 End stage renal disease: Secondary | ICD-10-CM | POA: Insufficient documentation

## 2015-01-05 DIAGNOSIS — E875 Hyperkalemia: Secondary | ICD-10-CM | POA: Diagnosis not present

## 2015-01-05 MED ORDER — ACETAMINOPHEN 325 MG PO TABS
650.0000 mg | ORAL_TABLET | Freq: Once | ORAL | Status: AC
  Administered 2015-01-05: 650 mg via ORAL
  Filled 2015-01-05: qty 2

## 2015-01-05 MED ORDER — SODIUM CHLORIDE 0.9 % IV SOLN
125.0000 mg | Freq: Once | INTRAVENOUS | Status: AC
  Administered 2015-01-05: 125 mg via INTRAVENOUS
  Filled 2015-01-05: qty 10

## 2015-01-05 MED ORDER — DIPHENHYDRAMINE HCL 25 MG PO CAPS
25.0000 mg | ORAL_CAPSULE | Freq: Once | ORAL | Status: AC
  Administered 2015-01-05: 25 mg via ORAL
  Filled 2015-01-05: qty 1

## 2015-01-05 NOTE — ED Notes (Signed)
Pt here for dialysis. Last dialysis on Tuesday.

## 2015-01-05 NOTE — ED Notes (Signed)
MEAL TRAY ORDERED
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
## 2015-01-05 NOTE — Progress Notes (Signed)
Pt discharged home after treatment; tolerated tx well, denies pain. N/V, dizziness or SOB. Pt abulated off the unit by herself to her awaiting ride at the ED

## 2015-01-09 ENCOUNTER — Inpatient Hospital Stay (HOSPITAL_COMMUNITY)
Admission: EM | Admit: 2015-01-09 | Discharge: 2015-01-10 | DRG: 291 | Disposition: A | Discharge status: Home / Self Care | Payer: Medicare Other | Attending: Internal Medicine | Admitting: Internal Medicine

## 2015-01-09 ENCOUNTER — Encounter (HOSPITAL_COMMUNITY): Payer: Self-pay | Admitting: *Deleted

## 2015-01-09 DIAGNOSIS — Z992 Dependence on renal dialysis: Secondary | ICD-10-CM

## 2015-01-09 DIAGNOSIS — D638 Anemia in other chronic diseases classified elsewhere: Secondary | ICD-10-CM | POA: Diagnosis not present

## 2015-01-09 DIAGNOSIS — Z9115 Patient's noncompliance with renal dialysis: Secondary | ICD-10-CM | POA: Diagnosis not present

## 2015-01-09 DIAGNOSIS — I5023 Acute on chronic systolic (congestive) heart failure: Secondary | ICD-10-CM | POA: Diagnosis not present

## 2015-01-09 DIAGNOSIS — N186 End stage renal disease: Secondary | ICD-10-CM

## 2015-01-09 DIAGNOSIS — F17210 Nicotine dependence, cigarettes, uncomplicated: Secondary | ICD-10-CM | POA: Diagnosis present

## 2015-01-09 DIAGNOSIS — F1290 Cannabis use, unspecified, uncomplicated: Secondary | ICD-10-CM | POA: Diagnosis present

## 2015-01-09 DIAGNOSIS — I132 Hypertensive heart and chronic kidney disease with heart failure and with stage 5 chronic kidney disease, or end stage renal disease: Principal | ICD-10-CM | POA: Diagnosis present

## 2015-01-09 DIAGNOSIS — E875 Hyperkalemia: Secondary | ICD-10-CM | POA: Diagnosis present

## 2015-01-09 DIAGNOSIS — M3214 Glomerular disease in systemic lupus erythematosus: Secondary | ICD-10-CM | POA: Diagnosis present

## 2015-01-09 DIAGNOSIS — R1013 Epigastric pain: Secondary | ICD-10-CM | POA: Diagnosis not present

## 2015-01-09 DIAGNOSIS — I120 Hypertensive chronic kidney disease with stage 5 chronic kidney disease or end stage renal disease: Secondary | ICD-10-CM | POA: Diagnosis not present

## 2015-01-09 DIAGNOSIS — N19 Unspecified kidney failure: Secondary | ICD-10-CM | POA: Diagnosis present

## 2015-01-09 DIAGNOSIS — I10 Essential (primary) hypertension: Secondary | ICD-10-CM | POA: Diagnosis present

## 2015-01-09 DIAGNOSIS — F250 Schizoaffective disorder, bipolar type: Secondary | ICD-10-CM | POA: Diagnosis present

## 2015-01-09 DIAGNOSIS — F203 Undifferentiated schizophrenia: Secondary | ICD-10-CM | POA: Diagnosis present

## 2015-01-09 LAB — COMPREHENSIVE METABOLIC PANEL
ALBUMIN: 3.2 g/dL — AB (ref 3.5–5.0)
ALK PHOS: 99 U/L (ref 38–126)
ALT: 15 U/L (ref 14–54)
ANION GAP: 16 — AB (ref 5–15)
AST: 20 U/L (ref 15–41)
BUN: 93 mg/dL — AB (ref 6–20)
CO2: 20 mmol/L — AB (ref 22–32)
Calcium: 9.9 mg/dL (ref 8.9–10.3)
Chloride: 101 mmol/L (ref 101–111)
Creatinine, Ser: 14.19 mg/dL — ABNORMAL HIGH (ref 0.44–1.00)
GFR calc Af Amer: 3 mL/min — ABNORMAL LOW (ref >60)
GFR calc non Af Amer: 3 mL/min — ABNORMAL LOW (ref >60)
GLUCOSE: 91 mg/dL (ref 65–99)
SODIUM: 137 mmol/L (ref 135–145)
Total Bilirubin: 0.9 mg/dL (ref 0.3–1.2)
Total Protein: 8.1 g/dL (ref 6.5–8.1)

## 2015-01-09 LAB — URINE MICROSCOPIC-ADD ON

## 2015-01-09 LAB — CBC
HCT: 32.5 % — ABNORMAL LOW (ref 36.0–46.0)
HEMATOCRIT: 32.9 % — AB (ref 36.0–46.0)
HEMOGLOBIN: 10.1 g/dL — AB (ref 12.0–15.0)
HEMOGLOBIN: 10.4 g/dL — AB (ref 12.0–15.0)
MCH: 27.8 pg (ref 26.0–34.0)
MCH: 28.7 pg (ref 26.0–34.0)
MCHC: 30.7 g/dL (ref 30.0–36.0)
MCHC: 32 g/dL (ref 30.0–36.0)
MCV: 90.6 fL (ref 78.0–100.0)
MCV: 89.5 fL (ref 78.0–100.0)
Platelets: 223 10*3/uL (ref 150–400)
Platelets: 174 10*3/uL (ref 150–400)
RBC: 3.63 MIL/uL — ABNORMAL LOW (ref 3.87–5.11)
RBC: 3.63 MIL/uL — AB (ref 3.87–5.11)
RDW: 17 % — AB (ref 11.5–15.5)
RDW: 16.9 % — ABNORMAL HIGH (ref 11.5–15.5)
WBC: 4.1 10*3/uL (ref 4.0–10.5)
WBC: 4.1 10*3/uL (ref 4.0–10.5)

## 2015-01-09 LAB — URINALYSIS, ROUTINE W REFLEX MICROSCOPIC
BILIRUBIN URINE: NEGATIVE
GLUCOSE, UA: 250 mg/dL — AB
Ketones, ur: NEGATIVE mg/dL
Nitrite: NEGATIVE
PH: 8 (ref 5.0–8.0)
Protein, ur: >300 mg/dL — AB
SPECIFIC GRAVITY, URINE: 1.013 (ref 1.005–1.030)
Urobilinogen, UA: 0.2 mg/dL (ref 0.0–1.0)

## 2015-01-09 LAB — LIPASE, BLOOD: Lipase: 27 U/L (ref 22–51)

## 2015-01-09 MED ORDER — SODIUM CHLORIDE 0.9 % IV SOLN: 100.0000 mL | INTRAVENOUS | Status: DC | PRN

## 2015-01-09 MED ORDER — HEPARIN SODIUM (PORCINE) 1000 UNIT/ML DIALYSIS
6000.0000 [IU] | Freq: Once | INTRAMUSCULAR | Status: AC
Start: 2015-01-09 — End: 2015-01-10
  Administered 2015-01-10: 1000 [IU] via INTRAVENOUS_CENTRAL
  Filled 2015-01-09: qty 6

## 2015-01-09 MED ORDER — HEPARIN SODIUM (PORCINE) 5000 UNIT/ML IJ SOLN: 5000.0000 [IU] | Freq: Three times a day (TID) | INTRAMUSCULAR | Status: DC

## 2015-01-09 MED ORDER — HYDROCOD POLST-CPM POLST ER 10-8 MG/5ML PO SUER: 5.0000 mL | Freq: Once | ORAL | Status: DC

## 2015-01-09 MED ORDER — DEXTROSE 50 % IV SOLN
1.0000 | Freq: Once | INTRAVENOUS | Status: AC
  Administered 2015-01-09: 50 mL via INTRAVENOUS
  Filled 2015-01-09: qty 50

## 2015-01-09 MED ORDER — PENTAFLUOROPROP-TETRAFLUOROETH EX AERO
1.0000 "application " | INHALATION_SPRAY | CUTANEOUS | Status: DC | PRN
  Filled 2015-01-09: qty 30

## 2015-01-09 MED ORDER — HEPARIN SODIUM (PORCINE) 1000 UNIT/ML DIALYSIS
1000.0000 [IU] | INTRAMUSCULAR | Status: DC | PRN
  Filled 2015-01-09: qty 1

## 2015-01-09 MED ORDER — LIDOCAINE-PRILOCAINE 2.5-2.5 % EX CREA
1.0000 "application " | TOPICAL_CREAM | CUTANEOUS | Status: DC | PRN
  Filled 2015-01-09: qty 5

## 2015-01-09 MED ORDER — SODIUM CHLORIDE 0.9 % IJ SOLN: 3.0000 mL | INTRAMUSCULAR | Status: DC | PRN

## 2015-01-09 MED ORDER — ONDANSETRON HCL 4 MG/2ML IJ SOLN
4.0000 mg | Freq: Four times a day (QID) | INTRAMUSCULAR | Status: DC | PRN
  Administered 2015-01-10: 4 mg via INTRAVENOUS
  Filled 2015-01-09: qty 2

## 2015-01-09 MED ORDER — CALCIUM GLUCONATE 10 % IV SOLN
1.0000 g | Freq: Once | INTRAVENOUS | Status: AC
  Administered 2015-01-09: 1 g via INTRAVENOUS
  Filled 2015-01-09: qty 10

## 2015-01-09 MED ORDER — INSULIN ASPART 100 UNIT/ML ~~LOC~~ SOLN
10.0000 [IU] | Freq: Once | SUBCUTANEOUS | Status: AC
  Administered 2015-01-09: 10 [IU] via INTRAVENOUS
  Filled 2015-01-09: qty 1

## 2015-01-09 MED ORDER — SODIUM CHLORIDE 0.9 % IJ SOLN: 3.0000 mL | Freq: Two times a day (BID) | INTRAMUSCULAR | Status: DC

## 2015-01-09 MED ORDER — HYDROCODONE-ACETAMINOPHEN 5-325 MG PO TABS
1.0000 | ORAL_TABLET | Freq: Four times a day (QID) | ORAL | Status: DC | PRN
  Administered 2015-01-10: 1 via ORAL

## 2015-01-09 MED ORDER — TRIAMCINOLONE ACETONIDE 0.025 % EX CREA
TOPICAL_CREAM | Freq: Two times a day (BID) | CUTANEOUS | Status: DC
  Administered 2015-01-10: 02:00:00 via TOPICAL
  Filled 2015-01-09: qty 15

## 2015-01-09 MED ORDER — SODIUM CHLORIDE 0.9 % IV SOLN
250.0000 mg | Freq: Once | INTRAVENOUS | Status: AC
  Administered 2015-01-10: 250 mg via INTRAVENOUS
  Filled 2015-01-09 (×2): qty 20

## 2015-01-09 MED ORDER — LIDOCAINE HCL (PF) 1 % IJ SOLN: 5.0000 mL | INTRAMUSCULAR | Status: DC | PRN

## 2015-01-09 MED ORDER — ALTEPLASE 2 MG IJ SOLR
2.0000 mg | Freq: Once | INTRAMUSCULAR | Status: DC | PRN
  Filled 2015-01-09: qty 2

## 2015-01-09 MED ORDER — ACETAMINOPHEN 500 MG PO TABS: 500.0000 mg | ORAL_TABLET | Freq: Four times a day (QID) | ORAL | Status: DC | PRN

## 2015-01-09 MED ORDER — SODIUM CHLORIDE 0.9 % IV SOLN: 250.0000 mL | INTRAVENOUS | Status: DC | PRN

## 2015-01-09 MED ORDER — SODIUM CHLORIDE 0.9 % IJ SOLN
3.0000 mL | Freq: Two times a day (BID) | INTRAMUSCULAR | Status: DC
  Administered 2015-01-09: 3 mL via INTRAVENOUS

## 2015-01-09 MED ORDER — METOPROLOL TARTRATE 50 MG PO TABS
50.0000 mg | ORAL_TABLET | Freq: Two times a day (BID) | ORAL | Status: DC
  Administered 2015-01-09: 50 mg via ORAL
  Filled 2015-01-09: qty 2
  Filled 2015-01-09: qty 1
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
## 2015-01-09 NOTE — ED Notes (Signed)
Pt back in room.
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
## 2015-01-09 NOTE — ED Notes (Signed)
Pt is here with nausea and vomiting and is here for routine dialysis.  Last HD was Friday.

## 2015-01-09 NOTE — ED Notes (Signed)
Pt abulated herself to the restroom.
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
## 2015-01-10 ENCOUNTER — Ambulatory Visit: Payer: Self-pay | Admitting: Internal Medicine

## 2015-01-10 ENCOUNTER — Ambulatory Visit: Payer: Self-pay | Admitting: Obstetrics & Gynecology

## 2015-01-10 DIAGNOSIS — N186 End stage renal disease: Secondary | ICD-10-CM

## 2015-01-10 DIAGNOSIS — Z992 Dependence on renal dialysis: Secondary | ICD-10-CM

## 2015-01-10 DIAGNOSIS — E875 Hyperkalemia: Secondary | ICD-10-CM

## 2015-01-10 DIAGNOSIS — I5023 Acute on chronic systolic (congestive) heart failure: Secondary | ICD-10-CM

## 2015-01-10 DIAGNOSIS — D638 Anemia in other chronic diseases classified elsewhere: Secondary | ICD-10-CM

## 2015-01-10 DIAGNOSIS — I150 Renovascular hypertension: Secondary | ICD-10-CM

## 2015-01-10 LAB — CBC
HCT: 29.1 % — ABNORMAL LOW (ref 36.0–46.0)
HEMOGLOBIN: 9.4 g/dL — AB (ref 12.0–15.0)
MCH: 29.1 pg (ref 26.0–34.0)
MCHC: 32.3 g/dL (ref 30.0–36.0)
MCV: 90.1 fL (ref 78.0–100.0)
Platelets: 137 10*3/uL — ABNORMAL LOW (ref 150–400)
RBC: 3.23 MIL/uL — AB (ref 3.87–5.11)
RDW: 17.1 % — ABNORMAL HIGH (ref 11.5–15.5)
WBC: 3.5 10*3/uL — ABNORMAL LOW (ref 4.0–10.5)

## 2015-01-10 LAB — MAGNESIUM: Magnesium: 2.2 mg/dL (ref 1.7–2.4)

## 2015-01-10 LAB — POCT I-STAT, CHEM 8
BUN: 54 mg/dL — AB (ref 6–20)
CALCIUM ION: 1.2 mmol/L (ref 1.12–1.23)
CHLORIDE: 100 mmol/L — AB (ref 101–111)
CREATININE: 9.8 mg/dL — AB (ref 0.44–1.00)
GLUCOSE: 95 mg/dL (ref 65–99)
HCT: 32 % — ABNORMAL LOW (ref 36.0–46.0)
Hemoglobin: 10.9 g/dL — ABNORMAL LOW (ref 12.0–15.0)
Potassium: 4.7 mmol/L (ref 3.5–5.1)
Sodium: 137 mmol/L (ref 135–145)
TCO2: 23 mmol/L (ref 0–100)

## 2015-01-10 LAB — BASIC METABOLIC PANEL
ANION GAP: 14 (ref 5–15)
BUN: 32 mg/dL — ABNORMAL HIGH (ref 6–20)
CHLORIDE: 96 mmol/L — AB (ref 101–111)
CO2: 26 mmol/L (ref 22–32)
Calcium: 9.1 mg/dL (ref 8.9–10.3)
Creatinine, Ser: 7.96 mg/dL — ABNORMAL HIGH (ref 0.44–1.00)
GFR calc non Af Amer: 6 mL/min — ABNORMAL LOW (ref >60)
GFR, EST AFRICAN AMERICAN: 7 mL/min — AB (ref >60)
GLUCOSE: 104 mg/dL — AB (ref 65–99)
Potassium: 4.2 mmol/L (ref 3.5–5.1)
Sodium: 136 mmol/L (ref 135–145)

## 2015-01-10 LAB — CREATININE, SERUM
Creatinine, Ser: 7.23 mg/dL — ABNORMAL HIGH (ref 0.44–1.00)
GFR calc Af Amer: 8 mL/min — ABNORMAL LOW (ref >60)
GFR, EST NON AFRICAN AMERICAN: 7 mL/min — AB (ref >60)

## 2015-01-10 LAB — PHOSPHORUS: Phosphorus: 5.5 mg/dL — ABNORMAL HIGH (ref 2.5–4.6)

## 2015-01-10 MED ORDER — HYDROCODONE-ACETAMINOPHEN 5-325 MG PO TABS: 1.0000 | ORAL_TABLET | Freq: Four times a day (QID) | ORAL | Status: DC | PRN

## 2015-01-10 MED ORDER — PROMETHAZINE HCL 12.5 MG PO TABS: 12.5000 mg | ORAL_TABLET | Freq: Four times a day (QID) | ORAL | Status: DC | PRN

## 2015-01-10 MED ORDER — ACETAMINOPHEN 325 MG PO TABS
ORAL_TABLET | ORAL | Status: AC
  Filled 2015-01-10: qty 2

## 2015-01-10 MED ORDER — HYDROCODONE-ACETAMINOPHEN 5-325 MG PO TABS
ORAL_TABLET | ORAL | Status: AC
  Filled 2015-01-10: qty 1

## 2015-01-10 NOTE — Care Management Note (Signed)
Case Management Note  Patient Details  Name: Sara [name] MRN: [id number] Date of Birth: 28-Mar-1981  Subjective/Objective:     CM following for progression and d/c planning.               Action/Plan: No d/c needs identified.   Expected Discharge Date:  01/10/2015              Expected Discharge Plan:  Home/Self Care  In-House Referral:  NA  Discharge planning Services  NA  Post Acute Care Choice:  NA Choice offered to:  NA  DME Arranged:    DME Agency:     HH Arranged:  NA HH Agency:     Status of Service:  Completed, signed off  Medicare Important Message Given:    Date Medicare IM Given:    Medicare IM give by:    Date Additional Medicare IM Given:    Additional Medicare Important Message give by:     If discussed at [place] of Stay Meetings, dates discussed:    Additional Comments:  Adron Bene, RN 01/10/2015, 1:40 PM

## 2015-01-10 NOTE — Procedures (Signed)
Patient stable on HD.  K is down to mid 4's today.  Volume is close to dry wt , up about 3-4 kg this am and exam improved w regard to volume.  Plan shorter HD this am then ok for dc home.    I was present at this dialysis session, have reviewed the session itself and made  appropriate changes Kelly Splinter MD (pgr) [phone]    ([phone] 01/10/2015, 11:42 AM

## 2015-01-10 NOTE — Discharge Summary (Signed)
Physician Discharge Summary   Patient ID: Sara [name] MRN: [id number] DOB/AGE: 1980-06-28 34 y.o.  Admit date: 01/09/2015 Discharge date: 01/10/2015  Primary Care Physician:  Smiley Houseman, MD  Discharge Diagnoses:    . Hyperkalemia . Uremia . ESRD on hemodialysis with no outpatient unit  . Schizoaffective disorder, bipolar type ([place]) . HTN (hypertension) . Anemia of chronic disease . Acute on chronic systolic congestive heart failure [facility])  Consults: Nephrology, Dr. Jonnie Finner   Recommendations for Outpatient Follow-up:  Unfortunately patient does not have outpatient hemodialysis unit due to behavioral issues  TESTS THAT NEED FOLLOW-UP BMET, CBC   DIET: Heart healthy diet    Allergies:   Allergies  Allergen Reactions  . Ativan [Lorazepam] Other (See Comments)    Dysarthria(patient has difficulty speaking and slurred speech); denies swelling, itching, pain, or numbness.  [name] [Ziprasidone Hcl] Itching and Swelling    Tongue swelling  . Keflex [Cephalexin] Swelling and Other (See Comments)    Tongue swelling. Can't talk   . Other Itching    wool     Discharge Medications:   Medication List    STOP taking these medications        doxycycline 100 MG tablet  Commonly known as:  VIBRA-TABS     guaiFENesin-codeine 100-10 MG/5ML syrup     haloperidol 5 MG tablet  Commonly known as:  HALDOL      TAKE these medications        acetaminophen 500 MG tablet  Commonly known as:  TYLENOL  Take 500 mg by mouth every 6 (six) hours as needed for mild pain.     HYDROcodone-acetaminophen 5-325 MG tablet  Commonly known as:  NORCO/VICODIN  Take 1 tablet by mouth every 6 (six) hours as needed for moderate pain or severe pain.     metoprolol 50 MG tablet  Commonly known as:  LOPRESSOR  Take 1 tablet (50 mg total) by mouth 2 (two) times daily.     promethazine 12.5 MG tablet  Commonly known as:  PHENERGAN  Take 1 tablet (12.5 mg total) by mouth every  6 (six) hours as needed for nausea or vomiting.     triamcinolone 0.025 % ointment  Commonly known as:  KENALOG  Apply 1 application topically 2 (two) times daily.         Brief H and P: For complete details please refer to admission H and P, but in brief Sara [name] is a 34 y.o. female  With end-stage renal disease noncompliant with outpatient hemodialysis as patient reports 5 months ago she was kicked out of her dialysis outpatient center. Patient states that she presented for dialysis which she usually does last dialysis was 01/05/2015 per patient reports. She presents today fluid overloaded and with elevated potassium levels. She also endorses nausea. Due to elevated potassium levels, uremia, patient was admitted for hemodialysis.   Hospital Course:   Hyperkalemia with underlying ESRD on hemodialysis, severely noncompliant, does not have outpatient hemodialysis spot Patient presented with facial swelling, hyperkalemia, EKG showing peaked T waves, uremia symptoms with nausea and vomiting and lethargy. - Patient was treated with calcium gluconate, NovoLog 10 units and 1 ampule of D50 in ED. Patient was released from the [facility] earlier this year due to her behavioral problems. She has not found another hemodialysis unit and comes to ED for hemodialysis.  - patient underwent hemodialysis on 01/09/15 and 01/10/15. Per nephrology, okay to discharge after hemodialysis today.   Acute on  chronic systolic CHF - Volume management with hemodialysis. Patient is severely noncompliant. 2-D echo in 5/16 had shown EF of 40-45% with inferolateral hypokinesis.  Nausea - Suspected secondary to uremia, improving with dialysis.   Day of Discharge BP 146/79 mmHg  Pulse 89  Temp(Src) 98.2 F (36.8 C) (Oral)  Resp 18  Ht 5\' 7"  (1.702 m)  Wt 62.1 kg (136 lb 14.5 oz)  BMI 21.44 kg/m2  SpO2 99%  Physical Exam: General: Alert and awake oriented x3 not in any acute  distress. HEENT: anicteric sclera, pupils reactive to light and accommodation CVS: S1-S2 clear no murmur rubs or gallops Chest: clear to auscultation bilaterally, no wheezing rales or rhonchi Abdomen: soft nontender, nondistended, normal bowel sounds Extremities: no cyanosis, clubbing, 1+ edema noted bilaterally Neuro: Cranial nerves II-XII intact, no focal neurological deficits   The results of significant diagnostics from this hospitalization (including imaging, microbiology, ancillary and laboratory) are listed below for reference.    LAB RESULTS: Basic Metabolic Panel:  Recent Labs Lab 01/09/15 1352 01/09/15 1851 01/09/15 2324 01/10/15 0510  NA 137 137  --  136  K >7.5* 4.7  --  4.2  CL 101 100*  --  96*  CO2 20*  --   --  26  GLUCOSE 91 95  --  104*  BUN 93* 54*  --  32*  CREATININE 14.19* 9.80* 7.23* 7.96*  CALCIUM 9.9  --   --  9.1  MG  --   --  2.2  --   PHOS  --   --  5.5*  --    Liver Function Tests:  Recent Labs Lab 01/04/15 2040 01/09/15 1352  AST  --  20  ALT  --  15  ALKPHOS  --  99  BILITOT  --  0.9  PROT  --  8.1  ALBUMIN 2.8* 3.2*    Recent Labs Lab 01/09/15 1352  LIPASE 27   No results for input(s): AMMONIA in the last 168 hours. CBC:  Recent Labs Lab 01/09/15 2324 01/10/15 0510  WBC 4.1 3.5*  HGB 10.4* 9.4*  HCT 32.5* 29.1*  MCV 89.5 90.1  PLT 174 137*   Cardiac Enzymes: No results for input(s): CKTOTAL, CKMB, CKMBINDEX, TROPONINI in the last 168 hours. BNP: Invalid input(s): POCBNP CBG: No results for input(s): GLUCAP in the last 168 hours.  Significant Diagnostic Studies:  No results found.  2D ECHO:   Disposition and Follow-up: Discharge Instructions    (HEART FAILURE PATIENTS) Call MD:  Anytime you have any of the following symptoms: 1) 3 pound weight gain in 24 hours or 5 pounds in 1 week 2) shortness of breath, with or without a dry hacking cough 3) swelling in the hands, feet or stomach 4) if you have to sleep on  extra pillows at night in order to breathe.    Complete by:  As directed      Diet - low sodium heart healthy    Complete by:  As directed      Increase activity slowly    Complete by:  As directed             DISPOSITION: Home   DISCHARGE FOLLOW-UP Follow-up Information    Follow up with Smiley Houseman, MD. Schedule an appointment as soon as possible for a visit in 2 weeks.   Specialty:  Family Medicine   Why:  for hospital follow-up   Contact information:   [place] [address]  [phone]        Time spent on Discharge: 25 minutes  Signed:   Halimah Bewick M.D. [facility] 01/10/2015, 11:39 AM Pager: [phone]

## 2015-01-10 NOTE — Progress Notes (Signed)
Pt provided with discharge instructions including information on follow up appointments and medications. Pt verbalized understanding of all information. IV dc'd without complication. Pt refused wheelchair and escort out.

## 2015-01-11 ENCOUNTER — Encounter (HOSPITAL_COMMUNITY): Payer: Self-pay | Admitting: Emergency Medicine

## 2015-01-11 ENCOUNTER — Emergency Department (HOSPITAL_COMMUNITY)
Admission: EM | Admit: 2015-01-11 | Discharge: 2015-01-12 | Disposition: A | Discharge status: Home / Self Care | Payer: Medicare Other | Attending: Emergency Medicine | Admitting: Emergency Medicine

## 2015-01-11 DIAGNOSIS — Z992 Dependence on renal dialysis: Secondary | ICD-10-CM | POA: Diagnosis not present

## 2015-01-11 DIAGNOSIS — Z720 Tobacco use: Secondary | ICD-10-CM | POA: Diagnosis not present

## 2015-01-11 DIAGNOSIS — N186 End stage renal disease: Secondary | ICD-10-CM | POA: Diagnosis not present

## 2015-01-11 DIAGNOSIS — Z79899 Other long term (current) drug therapy: Secondary | ICD-10-CM | POA: Insufficient documentation

## 2015-01-11 DIAGNOSIS — Z8659 Personal history of other mental and behavioral disorders: Secondary | ICD-10-CM | POA: Insufficient documentation

## 2015-01-11 DIAGNOSIS — Z862 Personal history of diseases of the blood and blood-forming organs and certain disorders involving the immune mechanism: Secondary | ICD-10-CM | POA: Insufficient documentation

## 2015-01-11 DIAGNOSIS — Y838 Other surgical procedures as the cause of abnormal reaction of the patient, or of later complication, without mention of misadventure at the time of the procedure: Secondary | ICD-10-CM | POA: Insufficient documentation

## 2015-01-11 DIAGNOSIS — I120 Hypertensive chronic kidney disease with stage 5 chronic kidney disease or end stage renal disease: Secondary | ICD-10-CM | POA: Insufficient documentation

## 2015-01-11 DIAGNOSIS — I509 Heart failure, unspecified: Secondary | ICD-10-CM | POA: Diagnosis not present

## 2015-01-11 DIAGNOSIS — T82868A Thrombosis of vascular prosthetic devices, implants and grafts, initial encounter: Secondary | ICD-10-CM | POA: Diagnosis present

## 2015-01-11 LAB — RENAL FUNCTION PANEL
ANION GAP: 13 (ref 5–15)
Albumin: 2.6 g/dL — ABNORMAL LOW (ref 3.5–5.0)
BUN: 28 mg/dL — AB (ref 6–20)
CALCIUM: 9.3 mg/dL (ref 8.9–10.3)
CO2: 29 mmol/L (ref 22–32)
Chloride: 97 mmol/L — ABNORMAL LOW (ref 101–111)
Creatinine, Ser: 7.66 mg/dL — ABNORMAL HIGH (ref 0.44–1.00)
GFR calc Af Amer: 7 mL/min — ABNORMAL LOW (ref >60)
GFR calc non Af Amer: 6 mL/min — ABNORMAL LOW (ref >60)
GLUCOSE: 121 mg/dL — AB (ref 65–99)
POTASSIUM: 4 mmol/L (ref 3.5–5.1)
Phosphorus: 8.8 mg/dL — ABNORMAL HIGH (ref 2.5–4.6)
Sodium: 139 mmol/L (ref 135–145)

## 2015-01-11 LAB — BASIC METABOLIC PANEL
Anion gap: 14 (ref 5–15)
BUN: 29 mg/dL — AB (ref 6–20)
CHLORIDE: 96 mmol/L — AB (ref 101–111)
CO2: 29 mmol/L (ref 22–32)
CREATININE: 7.74 mg/dL — AB (ref 0.44–1.00)
Calcium: 9.3 mg/dL (ref 8.9–10.3)
GFR calc Af Amer: 7 mL/min — ABNORMAL LOW (ref >60)
GFR calc non Af Amer: 6 mL/min — ABNORMAL LOW (ref >60)
Glucose, Bld: 118 mg/dL — ABNORMAL HIGH (ref 65–99)
Potassium: 4 mmol/L (ref 3.5–5.1)
Sodium: 139 mmol/L (ref 135–145)

## 2015-01-11 LAB — CBC
HEMATOCRIT: 30.8 % — AB (ref 36.0–46.0)
HEMOGLOBIN: 9.5 g/dL — AB (ref 12.0–15.0)
MCH: 28 pg (ref 26.0–34.0)
MCHC: 30.8 g/dL (ref 30.0–36.0)
MCV: 90.9 fL (ref 78.0–100.0)
Platelets: 168 10*3/uL (ref 150–400)
RBC: 3.39 MIL/uL — ABNORMAL LOW (ref 3.87–5.11)
RDW: 16.7 % — ABNORMAL HIGH (ref 11.5–15.5)
WBC: 3.8 10*3/uL — ABNORMAL LOW (ref 4.0–10.5)

## 2015-01-11 MED ORDER — HEPARIN SODIUM (PORCINE) 1000 UNIT/ML DIALYSIS
4000.0000 [IU] | Freq: Once | INTRAMUSCULAR | Status: DC
  Filled 2015-01-11: qty 4

## 2015-01-11 MED ORDER — LIDOCAINE-PRILOCAINE 2.5-2.5 % EX CREA: 1.0000 "application " | TOPICAL_CREAM | CUTANEOUS | Status: DC | PRN

## 2015-01-11 MED ORDER — ALTEPLASE 2 MG IJ SOLR
2.0000 mg | Freq: Once | INTRAMUSCULAR | Status: DC | PRN
  Filled 2015-01-11: qty 2

## 2015-01-11 MED ORDER — LIDOCAINE HCL (PF) 1 % IJ SOLN: 5.0000 mL | INTRAMUSCULAR | Status: DC | PRN

## 2015-01-11 MED ORDER — PENTAFLUOROPROP-TETRAFLUOROETH EX AERO: 1.0000 "application " | INHALATION_SPRAY | CUTANEOUS | Status: DC | PRN

## 2015-01-11 MED ORDER — HEPARIN SODIUM (PORCINE) 1000 UNIT/ML DIALYSIS
1000.0000 [IU] | INTRAMUSCULAR | Status: DC | PRN
  Filled 2015-01-11: qty 1

## 2015-01-11 MED ORDER — SODIUM CHLORIDE 0.9 % IV SOLN: 100.0000 mL | INTRAVENOUS | Status: DC | PRN
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
## 2015-01-11 NOTE — ED Notes (Signed)
Pt here for routine dialysis treatment; pt last dialysis was Tuesday; pt sts admitted for N/V and having some nausea but denies other complaint at present

## 2015-01-13 ENCOUNTER — Non-Acute Institutional Stay (HOSPITAL_COMMUNITY)
Admission: EM | Admit: 2015-01-13 | Discharge: 2015-01-13 | Disposition: A | Discharge status: Home / Self Care | Payer: Medicare Other | Attending: Emergency Medicine | Admitting: Emergency Medicine

## 2015-01-13 ENCOUNTER — Encounter (HOSPITAL_COMMUNITY): Payer: Self-pay

## 2015-01-13 DIAGNOSIS — I129 Hypertensive chronic kidney disease with stage 1 through stage 4 chronic kidney disease, or unspecified chronic kidney disease: Secondary | ICD-10-CM | POA: Diagnosis not present

## 2015-01-13 DIAGNOSIS — N189 Chronic kidney disease, unspecified: Secondary | ICD-10-CM | POA: Diagnosis not present

## 2015-01-13 DIAGNOSIS — F17200 Nicotine dependence, unspecified, uncomplicated: Secondary | ICD-10-CM | POA: Insufficient documentation

## 2015-01-13 DIAGNOSIS — Z992 Dependence on renal dialysis: Secondary | ICD-10-CM | POA: Diagnosis not present

## 2015-01-13 DIAGNOSIS — I120 Hypertensive chronic kidney disease with stage 5 chronic kidney disease or end stage renal disease: Secondary | ICD-10-CM | POA: Diagnosis not present

## 2015-01-13 DIAGNOSIS — N186 End stage renal disease: Secondary | ICD-10-CM | POA: Insufficient documentation

## 2015-01-13 LAB — I-STAT CHEM 8, ED
BUN: 45 mg/dL — AB (ref 6–20)
CREATININE: 9.5 mg/dL — AB (ref 0.44–1.00)
Calcium, Ion: 1.15 mmol/L (ref 1.12–1.23)
Chloride: 100 mmol/L — ABNORMAL LOW (ref 101–111)
Glucose, Bld: 73 mg/dL (ref 65–99)
HEMATOCRIT: 36 % (ref 36.0–46.0)
Hemoglobin: 12.2 g/dL (ref 12.0–15.0)
POTASSIUM: 5 mmol/L (ref 3.5–5.1)
Sodium: 136 mmol/L (ref 135–145)
TCO2: 25 mmol/L (ref 0–100)

## 2015-01-13 LAB — RENAL FUNCTION PANEL
ALBUMIN: 2.5 g/dL — AB (ref 3.5–5.0)
ANION GAP: 12 (ref 5–15)
BUN: 39 mg/dL — AB (ref 6–20)
CALCIUM: 8.7 mg/dL — AB (ref 8.9–10.3)
CO2: 25 mmol/L (ref 22–32)
CREATININE: 8.5 mg/dL — AB (ref 0.44–1.00)
Chloride: 96 mmol/L — ABNORMAL LOW (ref 101–111)
GFR calc Af Amer: 6 mL/min — ABNORMAL LOW (ref >60)
GFR calc non Af Amer: 6 mL/min — ABNORMAL LOW (ref >60)
GLUCOSE: 130 mg/dL — AB (ref 65–99)
PHOSPHORUS: 7.6 mg/dL — AB (ref 2.5–4.6)
Potassium: 4.3 mmol/L (ref 3.5–5.1)
SODIUM: 133 mmol/L — AB (ref 135–145)

## 2015-01-13 MED ORDER — PENTAFLUOROPROP-TETRAFLUOROETH EX AERO: 1.0000 "application " | INHALATION_SPRAY | CUTANEOUS | Status: DC | PRN

## 2015-01-13 MED ORDER — LIDOCAINE HCL (PF) 1 % IJ SOLN: 5.0000 mL | INTRAMUSCULAR | Status: DC | PRN

## 2015-01-13 MED ORDER — HEPARIN SODIUM (PORCINE) 1000 UNIT/ML DIALYSIS
1000.0000 [IU] | INTRAMUSCULAR | Status: DC | PRN
  Filled 2015-01-13: qty 1

## 2015-01-13 MED ORDER — SODIUM CHLORIDE 0.9 % IV SOLN: 100.0000 mL | INTRAVENOUS | Status: DC | PRN

## 2015-01-13 MED ORDER — LIDOCAINE-PRILOCAINE 2.5-2.5 % EX CREA: 1.0000 "application " | TOPICAL_CREAM | CUTANEOUS | Status: DC | PRN

## 2015-01-13 MED ORDER — HYDROXYZINE HCL 25 MG PO TABS
ORAL_TABLET | ORAL | Status: AC
  Filled 2015-01-13: qty 1

## 2015-01-13 MED ORDER — HEPARIN SODIUM (PORCINE) 1000 UNIT/ML DIALYSIS
6000.0000 [IU] | Freq: Once | INTRAMUSCULAR | Status: DC
  Filled 2015-01-13: qty 6

## 2015-01-13 MED ORDER — ACETAMINOPHEN 325 MG PO TABS
ORAL_TABLET | ORAL | Status: AC
  Administered 2015-01-13: 20:00:00
  Filled 2015-01-13: qty 2

## 2015-01-13 MED ORDER — ALTEPLASE 2 MG IJ SOLR
2.0000 mg | Freq: Once | INTRAMUSCULAR | Status: DC | PRN
  Filled 2015-01-13: qty 2

## 2015-01-13 NOTE — ED Notes (Signed)
Pt here routine dialysis, last dialysis was Thursday. Denies N/V/D/SOB/CP. NAD, pt A&OX4.
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
## 2015-01-13 NOTE — ED Notes (Signed)
Pt to go to Dialysis, Dr.Tegeler made aware

## 2015-01-16 ENCOUNTER — Encounter (HOSPITAL_COMMUNITY): Payer: Self-pay | Admitting: Emergency Medicine

## 2015-01-16 ENCOUNTER — Emergency Department (HOSPITAL_COMMUNITY)
Admission: EM | Admit: 2015-01-16 | Discharge: 2015-01-17 | Discharge status: Against Medical Advice | Payer: Medicare Other | Attending: Emergency Medicine | Admitting: Emergency Medicine

## 2015-01-16 DIAGNOSIS — I120 Hypertensive chronic kidney disease with stage 5 chronic kidney disease or end stage renal disease: Secondary | ICD-10-CM | POA: Insufficient documentation

## 2015-01-16 DIAGNOSIS — Z4931 Encounter for adequacy testing for hemodialysis: Secondary | ICD-10-CM | POA: Diagnosis not present

## 2015-01-16 DIAGNOSIS — F17210 Nicotine dependence, cigarettes, uncomplicated: Secondary | ICD-10-CM | POA: Insufficient documentation

## 2015-01-16 DIAGNOSIS — I509 Heart failure, unspecified: Secondary | ICD-10-CM | POA: Diagnosis not present

## 2015-01-16 DIAGNOSIS — N186 End stage renal disease: Secondary | ICD-10-CM | POA: Diagnosis not present

## 2015-01-16 DIAGNOSIS — Z992 Dependence on renal dialysis: Secondary | ICD-10-CM | POA: Diagnosis not present

## 2015-01-16 LAB — RENAL FUNCTION PANEL
ALBUMIN: 2.8 g/dL — AB (ref 3.5–5.0)
ANION GAP: 15 (ref 5–15)
BUN: 72 mg/dL — ABNORMAL HIGH (ref 6–20)
CALCIUM: 8.8 mg/dL — AB (ref 8.9–10.3)
CO2: 23 mmol/L (ref 22–32)
Chloride: 98 mmol/L — ABNORMAL LOW (ref 101–111)
Creatinine, Ser: 11.56 mg/dL — ABNORMAL HIGH (ref 0.44–1.00)
GFR, EST AFRICAN AMERICAN: 4 mL/min — AB (ref >60)
GFR, EST NON AFRICAN AMERICAN: 4 mL/min — AB (ref >60)
Glucose, Bld: 118 mg/dL — ABNORMAL HIGH (ref 65–99)
PHOSPHORUS: 11.1 mg/dL — AB (ref 2.5–4.6)
POTASSIUM: 5.6 mmol/L — AB (ref 3.5–5.1)
SODIUM: 136 mmol/L (ref 135–145)

## 2015-01-16 LAB — CBC
HEMATOCRIT: 26.3 % — AB (ref 36.0–46.0)
HEMOGLOBIN: 8.3 g/dL — AB (ref 12.0–15.0)
MCH: 28 pg (ref 26.0–34.0)
MCHC: 31.6 g/dL (ref 30.0–36.0)
MCV: 88.9 fL (ref 78.0–100.0)
Platelets: 158 10*3/uL (ref 150–400)
RBC: 2.96 MIL/uL — ABNORMAL LOW (ref 3.87–5.11)
RDW: 16 % — ABNORMAL HIGH (ref 11.5–15.5)
WBC: 4.2 10*3/uL (ref 4.0–10.5)

## 2015-01-16 MED ORDER — HEPARIN SODIUM (PORCINE) 1000 UNIT/ML DIALYSIS
1000.0000 [IU] | INTRAMUSCULAR | Status: DC | PRN
  Filled 2015-01-16: qty 1

## 2015-01-16 MED ORDER — LIDOCAINE-PRILOCAINE 2.5-2.5 % EX CREA: 1.0000 "application " | TOPICAL_CREAM | CUTANEOUS | Status: DC | PRN

## 2015-01-16 MED ORDER — SODIUM CHLORIDE 0.9 % IV SOLN: 100.0000 mL | INTRAVENOUS | Status: DC | PRN

## 2015-01-16 MED ORDER — LIDOCAINE HCL (PF) 1 % IJ SOLN: 5.0000 mL | INTRAMUSCULAR | Status: DC | PRN

## 2015-01-16 MED ORDER — ALTEPLASE 2 MG IJ SOLR
2.0000 mg | Freq: Once | INTRAMUSCULAR | Status: DC | PRN
  Filled 2015-01-16: qty 2

## 2015-01-16 MED ORDER — PENTAFLUOROPROP-TETRAFLUOROETH EX AERO: 1.0000 "application " | INHALATION_SPRAY | CUTANEOUS | Status: DC | PRN

## 2015-01-16 MED ORDER — HEPARIN SODIUM (PORCINE) 1000 UNIT/ML DIALYSIS
5000.0000 [IU] | INTRAMUSCULAR | Status: DC | PRN
  Filled 2015-01-16: qty 5

## 2015-01-16 NOTE — ED Notes (Signed)
Pt did not answer.

## 2015-01-16 NOTE — Progress Notes (Signed)
[facility] Brief Note  Pt has come to the ED in hopes of getting HD today Last treatment was 10/8. Orders written, and pt to be discharged to home post HD.  Jamal Maes, MD [facility] [phone] Pager 01/16/2015, 6:08 PM

## 2015-01-16 NOTE — ED Notes (Signed)
Pt here for routine dialysis with last dialysis on Saturday

## 2015-01-17 NOTE — ED Notes (Signed)
Patient came to ED demanding a taxi voucher, stating that she had arrangements with social work because of her late dialysis.  AC notified.  Taxi voucher given to patient.

## 2015-01-18 ENCOUNTER — Encounter (HOSPITAL_COMMUNITY): Payer: Self-pay | Admitting: *Deleted

## 2015-01-18 ENCOUNTER — Emergency Department (HOSPITAL_COMMUNITY)
Admission: EM | Admit: 2015-01-18 | Discharge: 2015-01-18 | Discharge status: Against Medical Advice | Payer: Medicare Other | Attending: Emergency Medicine | Admitting: Emergency Medicine

## 2015-01-18 DIAGNOSIS — R05 Cough: Secondary | ICD-10-CM | POA: Insufficient documentation

## 2015-01-18 DIAGNOSIS — I509 Heart failure, unspecified: Secondary | ICD-10-CM | POA: Diagnosis not present

## 2015-01-18 DIAGNOSIS — Z992 Dependence on renal dialysis: Secondary | ICD-10-CM | POA: Diagnosis not present

## 2015-01-18 DIAGNOSIS — Z720 Tobacco use: Secondary | ICD-10-CM | POA: Diagnosis not present

## 2015-01-18 DIAGNOSIS — I120 Hypertensive chronic kidney disease with stage 5 chronic kidney disease or end stage renal disease: Secondary | ICD-10-CM | POA: Insufficient documentation

## 2015-01-18 DIAGNOSIS — N186 End stage renal disease: Secondary | ICD-10-CM | POA: Diagnosis not present

## 2015-01-18 DIAGNOSIS — R062 Wheezing: Secondary | ICD-10-CM | POA: Diagnosis not present

## 2015-01-18 NOTE — ED Notes (Signed)
Pt told registration clerk that she was leaving and would be back in the morning. Angela,the dialysis nurse was made aware.

## 2015-01-18 NOTE — ED Notes (Addendum)
Pt last had dialysis Tuesday. States she usually goes to [facility] for dialysis. Attempted to explain that we typically do not do outpt dialysis here and she was adamant that she has had it before, stating "well, they may just have to admit me so I can have it Pt also states cough with wheezing at night, x 3 weeks. No distress at this time.

## 2015-01-18 NOTE — ED Notes (Signed)
Spoke with Levada Dy, dialysis nurse and she stated to have MD  page [facility] and that he was aware. She stated they would do her dialysis today because she does not have an outpt dialysis center to go to, she usually goes to [facility].

## 2015-01-20 ENCOUNTER — Non-Acute Institutional Stay (HOSPITAL_COMMUNITY)
Admission: EM | Admit: 2015-01-20 | Discharge: 2015-01-20 | Discharge status: Against Medical Advice | Payer: Medicare Other | Attending: Emergency Medicine | Admitting: Emergency Medicine

## 2015-01-20 ENCOUNTER — Encounter (HOSPITAL_COMMUNITY): Payer: Self-pay | Admitting: Emergency Medicine

## 2015-01-20 DIAGNOSIS — D649 Anemia, unspecified: Secondary | ICD-10-CM | POA: Diagnosis not present

## 2015-01-20 DIAGNOSIS — Z992 Dependence on renal dialysis: Secondary | ICD-10-CM | POA: Diagnosis not present

## 2015-01-20 DIAGNOSIS — F319 Bipolar disorder, unspecified: Secondary | ICD-10-CM | POA: Insufficient documentation

## 2015-01-20 DIAGNOSIS — N186 End stage renal disease: Secondary | ICD-10-CM | POA: Insufficient documentation

## 2015-01-20 DIAGNOSIS — M3214 Glomerular disease in systemic lupus erythematosus: Secondary | ICD-10-CM | POA: Diagnosis not present

## 2015-01-20 DIAGNOSIS — Z4901 Encounter for fitting and adjustment of extracorporeal dialysis catheter: Secondary | ICD-10-CM | POA: Diagnosis not present

## 2015-01-20 DIAGNOSIS — I132 Hypertensive heart and chronic kidney disease with heart failure and with stage 5 chronic kidney disease, or end stage renal disease: Secondary | ICD-10-CM | POA: Diagnosis not present

## 2015-01-20 DIAGNOSIS — M25552 Pain in left hip: Secondary | ICD-10-CM | POA: Insufficient documentation

## 2015-01-20 DIAGNOSIS — I120 Hypertensive chronic kidney disease with stage 5 chronic kidney disease or end stage renal disease: Secondary | ICD-10-CM | POA: Diagnosis not present

## 2015-01-20 DIAGNOSIS — I509 Heart failure, unspecified: Secondary | ICD-10-CM | POA: Diagnosis not present

## 2015-01-20 DIAGNOSIS — F209 Schizophrenia, unspecified: Secondary | ICD-10-CM | POA: Insufficient documentation

## 2015-01-20 DIAGNOSIS — F17210 Nicotine dependence, cigarettes, uncomplicated: Secondary | ICD-10-CM | POA: Diagnosis not present

## 2015-01-20 DIAGNOSIS — Z79899 Other long term (current) drug therapy: Secondary | ICD-10-CM | POA: Insufficient documentation

## 2015-01-20 MED ORDER — ACETAMINOPHEN 325 MG PO TABS
ORAL_TABLET | ORAL | Status: AC
  Filled 2015-01-20: qty 2

## 2015-01-20 MED ORDER — HEPARIN 1000 UNIT/ML FOR PERITONEAL DIALYSIS
2000.0000 [IU] | INTRAMUSCULAR | Status: DC | PRN
  Administered 2015-01-20: 2000 [IU] via INTRAPERITONEAL
  Filled 2015-01-20: qty 2

## 2015-01-20 MED ORDER — ACETAMINOPHEN 325 MG PO TABS
650.0000 mg | ORAL_TABLET | Freq: Four times a day (QID) | ORAL | Status: DC | PRN
  Administered 2015-01-20: 650 mg via ORAL
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
## 2015-01-20 NOTE — ED Notes (Signed)
Patient states she is here for dialysis - last time was Tuesday (goes Tuesday, Thursday, Saturday).  Patient also c/o chronic L hip pain. Patient A&O x 4. NAD.

## 2015-01-20 NOTE — Procedures (Signed)
Patient requested to end treatment one hour early.she signed AMA form and was discharged

## 2015-01-20 NOTE — Progress Notes (Signed)
ER staff called to inform that pt can not eat in HD and likes to eat prior to coming, ER staff to call/have pt call dietary for lunch tray ASAP

## 2015-01-22 DIAGNOSIS — L870 Keratosis follicularis et parafollicularis in cutem penetrans: Secondary | ICD-10-CM | POA: Diagnosis not present

## 2015-01-23 ENCOUNTER — Emergency Department (HOSPITAL_COMMUNITY)
Admission: EM | Admit: 2015-01-23 | Discharge: 2015-01-24 | Disposition: A | Discharge status: Home / Self Care | Payer: Medicare Other | Attending: Emergency Medicine | Admitting: Emergency Medicine

## 2015-01-23 ENCOUNTER — Encounter (HOSPITAL_COMMUNITY): Payer: Self-pay | Admitting: Emergency Medicine

## 2015-01-23 DIAGNOSIS — Z720 Tobacco use: Secondary | ICD-10-CM | POA: Insufficient documentation

## 2015-01-23 DIAGNOSIS — Z862 Personal history of diseases of the blood and blood-forming organs and certain disorders involving the immune mechanism: Secondary | ICD-10-CM | POA: Diagnosis not present

## 2015-01-23 DIAGNOSIS — N186 End stage renal disease: Secondary | ICD-10-CM | POA: Diagnosis not present

## 2015-01-23 DIAGNOSIS — T85621A Displacement of intraperitoneal dialysis catheter, initial encounter: Secondary | ICD-10-CM | POA: Insufficient documentation

## 2015-01-23 DIAGNOSIS — Y812 Prosthetic and other implants, materials and accessory general- and plastic-surgery devices associated with adverse incidents: Secondary | ICD-10-CM | POA: Insufficient documentation

## 2015-01-23 DIAGNOSIS — Z79899 Other long term (current) drug therapy: Secondary | ICD-10-CM | POA: Diagnosis not present

## 2015-01-23 DIAGNOSIS — I120 Hypertensive chronic kidney disease with stage 5 chronic kidney disease or end stage renal disease: Secondary | ICD-10-CM | POA: Insufficient documentation

## 2015-01-23 DIAGNOSIS — T829XXA Unspecified complication of cardiac and vascular prosthetic device, implant and graft, initial encounter: Secondary | ICD-10-CM

## 2015-01-23 DIAGNOSIS — I509 Heart failure, unspecified: Secondary | ICD-10-CM | POA: Insufficient documentation

## 2015-01-23 DIAGNOSIS — Z8739 Personal history of other diseases of the musculoskeletal system and connective tissue: Secondary | ICD-10-CM | POA: Diagnosis not present

## 2015-01-23 DIAGNOSIS — Z8659 Personal history of other mental and behavioral disorders: Secondary | ICD-10-CM | POA: Diagnosis not present

## 2015-01-23 DIAGNOSIS — Z7952 Long term (current) use of systemic steroids: Secondary | ICD-10-CM | POA: Insufficient documentation

## 2015-01-23 DIAGNOSIS — I10 Essential (primary) hypertension: Secondary | ICD-10-CM | POA: Diagnosis not present

## 2015-01-23 DIAGNOSIS — T8089XA Other complications following infusion, transfusion and therapeutic injection, initial encounter: Secondary | ICD-10-CM | POA: Diagnosis not present

## 2015-01-23 LAB — BASIC METABOLIC PANEL
Anion gap: 16 — ABNORMAL HIGH (ref 5–15)
BUN: 66 mg/dL — ABNORMAL HIGH (ref 6–20)
CALCIUM: 9.3 mg/dL (ref 8.9–10.3)
CO2: 21 mmol/L — AB (ref 22–32)
CREATININE: 13.81 mg/dL — AB (ref 0.44–1.00)
Chloride: 101 mmol/L (ref 101–111)
GFR calc non Af Amer: 3 mL/min — ABNORMAL LOW (ref >60)
GFR, EST AFRICAN AMERICAN: 4 mL/min — AB (ref >60)
Glucose, Bld: 91 mg/dL (ref 65–99)
Potassium: 5.1 mmol/L (ref 3.5–5.1)
Sodium: 138 mmol/L (ref 135–145)

## 2015-01-23 LAB — CBC WITH DIFFERENTIAL/PLATELET
BASOS PCT: 1 %
Basophils Absolute: 0 10*3/uL (ref 0.0–0.1)
EOS ABS: 0.1 10*3/uL (ref 0.0–0.7)
Eosinophils Relative: 4 %
HEMATOCRIT: 28.2 % — AB (ref 36.0–46.0)
Hemoglobin: 9.1 g/dL — ABNORMAL LOW (ref 12.0–15.0)
Lymphocytes Relative: 31 %
Lymphs Abs: 1.1 10*3/uL (ref 0.7–4.0)
MCH: 28.4 pg (ref 26.0–34.0)
MCHC: 32.3 g/dL (ref 30.0–36.0)
MCV: 88.1 fL (ref 78.0–100.0)
MONO ABS: 0.3 10*3/uL (ref 0.1–1.0)
MONOS PCT: 10 %
NEUTROS ABS: 1.9 10*3/uL (ref 1.7–7.7)
Neutrophils Relative %: 54 %
Platelets: 192 10*3/uL (ref 150–400)
RBC: 3.2 MIL/uL — ABNORMAL LOW (ref 3.87–5.11)
RDW: 15.4 % (ref 11.5–15.5)
WBC: 3.4 10*3/uL — ABNORMAL LOW (ref 4.0–10.5)

## 2015-01-23 LAB — I-STAT CHEM 8, ED
BUN: 65 mg/dL — AB (ref 6–20)
CALCIUM ION: 1.04 mmol/L — AB (ref 1.12–1.23)
CREATININE: 14.3 mg/dL — AB (ref 0.44–1.00)
Chloride: 103 mmol/L (ref 101–111)
Glucose, Bld: 96 mg/dL (ref 65–99)
HCT: 30 % — ABNORMAL LOW (ref 36.0–46.0)
Hemoglobin: 10.2 g/dL — ABNORMAL LOW (ref 12.0–15.0)
Potassium: 4.9 mmol/L (ref 3.5–5.1)
Sodium: 137 mmol/L (ref 135–145)
TCO2: 20 mmol/L (ref 0–100)

## 2015-01-23 MED ORDER — ACETAMINOPHEN 325 MG PO TABS
ORAL_TABLET | ORAL | Status: AC
  Filled 2015-01-23: qty 2

## 2015-01-23 MED ORDER — ACETAMINOPHEN 325 MG PO TABS
650.0000 mg | ORAL_TABLET | Freq: Once | ORAL | Status: AC
  Administered 2015-01-23: 650 mg via ORAL
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
## 2015-01-23 NOTE — ED Notes (Signed)
Pt here for routine dialysis; pt last dialysis was Saturday

## 2015-01-23 NOTE — ED Notes (Signed)
Pt taken up for HD by EMT

## 2015-01-23 NOTE — Procedures (Signed)
  I was present at this dialysis session, have reviewed the session itself and made  appropriate changes Kelly Splinter MD [place] pager [phone]    cell [phone] 01/23/2015, 4:54 PM

## 2015-01-23 NOTE — ED Notes (Signed)
Call to [place] in HD

## 2015-01-25 ENCOUNTER — Ambulatory Visit (INDEPENDENT_AMBULATORY_CARE_PROVIDER_SITE_OTHER): Payer: Medicare Other | Admitting: Certified Nurse Midwife

## 2015-01-25 ENCOUNTER — Emergency Department (HOSPITAL_COMMUNITY): Payer: Medicare Other

## 2015-01-25 ENCOUNTER — Encounter (HOSPITAL_COMMUNITY): Payer: Self-pay | Admitting: Emergency Medicine

## 2015-01-25 ENCOUNTER — Other Ambulatory Visit (HOSPITAL_COMMUNITY)
Admission: RE | Admit: 2015-01-25 | Discharge: 2015-01-25 | Disposition: A | Discharge status: Home / Self Care | Payer: Medicare Other | Source: Ambulatory Visit | Attending: Certified Nurse Midwife | Admitting: Certified Nurse Midwife

## 2015-01-25 ENCOUNTER — Emergency Department (HOSPITAL_COMMUNITY)
Admission: EM | Admit: 2015-01-25 | Discharge: 2015-01-26 | Disposition: A | Discharge status: Home / Self Care | Payer: Medicare Other | Attending: Emergency Medicine | Admitting: Emergency Medicine

## 2015-01-25 VITALS — BP 124/88 | HR 83 | Temp 98.3°F | Wt 135.0 lb

## 2015-01-25 DIAGNOSIS — R21 Rash and other nonspecific skin eruption: Secondary | ICD-10-CM | POA: Insufficient documentation

## 2015-01-25 DIAGNOSIS — N186 End stage renal disease: Secondary | ICD-10-CM | POA: Insufficient documentation

## 2015-01-25 DIAGNOSIS — Z01419 Encounter for gynecological examination (general) (routine) without abnormal findings: Secondary | ICD-10-CM

## 2015-01-25 DIAGNOSIS — E079 Disorder of thyroid, unspecified: Secondary | ICD-10-CM | POA: Diagnosis not present

## 2015-01-25 DIAGNOSIS — Z79899 Other long term (current) drug therapy: Secondary | ICD-10-CM | POA: Insufficient documentation

## 2015-01-25 DIAGNOSIS — I509 Heart failure, unspecified: Secondary | ICD-10-CM | POA: Diagnosis not present

## 2015-01-25 DIAGNOSIS — I120 Hypertensive chronic kidney disease with stage 5 chronic kidney disease or end stage renal disease: Secondary | ICD-10-CM | POA: Diagnosis not present

## 2015-01-25 DIAGNOSIS — Z113 Encounter for screening for infections with a predominantly sexual mode of transmission: Secondary | ICD-10-CM

## 2015-01-25 DIAGNOSIS — Z124 Encounter for screening for malignant neoplasm of cervix: Secondary | ICD-10-CM

## 2015-01-25 DIAGNOSIS — Z7952 Long term (current) use of systemic steroids: Secondary | ICD-10-CM | POA: Diagnosis not present

## 2015-01-25 DIAGNOSIS — Z1151 Encounter for screening for human papillomavirus (HPV): Secondary | ICD-10-CM | POA: Insufficient documentation

## 2015-01-25 DIAGNOSIS — Z8659 Personal history of other mental and behavioral disorders: Secondary | ICD-10-CM | POA: Diagnosis not present

## 2015-01-25 DIAGNOSIS — M25571 Pain in right ankle and joints of right foot: Secondary | ICD-10-CM | POA: Diagnosis not present

## 2015-01-25 DIAGNOSIS — Z862 Personal history of diseases of the blood and blood-forming organs and certain disorders involving the immune mechanism: Secondary | ICD-10-CM | POA: Insufficient documentation

## 2015-01-25 DIAGNOSIS — R011 Cardiac murmur, unspecified: Secondary | ICD-10-CM | POA: Diagnosis not present

## 2015-01-25 DIAGNOSIS — M79671 Pain in right foot: Secondary | ICD-10-CM | POA: Diagnosis not present

## 2015-01-25 DIAGNOSIS — M25551 Pain in right hip: Secondary | ICD-10-CM | POA: Diagnosis not present

## 2015-01-25 DIAGNOSIS — Z992 Dependence on renal dialysis: Secondary | ICD-10-CM | POA: Diagnosis not present

## 2015-01-25 DIAGNOSIS — Z1329 Encounter for screening for other suspected endocrine disorder: Secondary | ICD-10-CM | POA: Diagnosis not present

## 2015-01-25 DIAGNOSIS — Z720 Tobacco use: Secondary | ICD-10-CM | POA: Diagnosis not present

## 2015-01-25 DIAGNOSIS — Z0000 Encounter for general adult medical examination without abnormal findings: Secondary | ICD-10-CM | POA: Diagnosis not present

## 2015-01-25 MED ORDER — HYDROCODONE-ACETAMINOPHEN 5-325 MG PO TABS
1.0000 | ORAL_TABLET | Freq: Once | ORAL | Status: AC
  Administered 2015-01-26: 1 via ORAL
  Filled 2015-01-25: qty 1

## 2015-01-25 MED ORDER — MEDROXYPROGESTERONE ACETATE 10 MG PO TABS: 10.0000 mg | ORAL_TABLET | Freq: Every day | ORAL | Status: DC

## 2015-01-25 NOTE — ED Notes (Signed)
Pt. is here for routine hemodialysis ( Tues/Thurs/Sat ) treatment , pt. added mild right foot pain onset this morning denies injury /ambulatory .

## 2015-01-25 NOTE — Addendum Note (Signed)
Addended by: Yvonne Kendall A on: 01/25/2015 04:17 PM   Modules accepted: Orders
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
## 2015-01-25 NOTE — Addendum Note (Signed)
Addended by: Phillip Heal, Kanijah Groseclose A on: 01/25/2015 03:47 PM   Modules accepted: Orders

## 2015-01-25 NOTE — Patient Instructions (Signed)
Galactorrhea Galactorrhea is an abnormal milky discharge from the breast. The discharge may come from one or both nipples. The fluid is often white, yellow, or green. It is different from the normal milk produced in nursing mothers. Galactorrhea usually occurs in women, but it can sometimes affect men. Various things can cause galactorrhea. It is often caused by irritation of the breast, which can result from injury, stimulation during sexual activity, or clothes rubbing against the nipple. It may also be related to medicines or changes in hormone levels. In many cases, galactorrhea will go away without treatment. However, galactorrhea can also be a sign of something more serious, such as diseases of the kidney or thyroid or problems with the pituitary gland. Your health care provider may do various tests to help determine the cause. Sometimes the cause is unknown. It is important to monitor your condition to make sure that it goes away. HOME CARE INSTRUCTIONS  Watch your condition for any changes. The following actions may help to lessen any discomfort that you are feeling:  Take medicines only as directed by your health care provider.  Do not squeeze your breasts or nipples.  Avoid breast stimulation during sexual activity.   Perform a breast self-exam once a month. Doing this more often can irritate your breasts.  Avoid clothes that rub on your nipples.  Use breast pads to absorb the discharge.  Wear a breast binder or a support bra to help prevent clothes from rubbing on your nipples.  Keep all follow-up visits as directed by your health care provider. This is important. SEEK MEDICAL CARE IF:  You develop hot flashes, vaginal dryness, or a lack of sexual desire.  You stop having menstrual periods, or they are irregular or far apart.  You have headaches.  You have vision problems. SEEK IMMEDIATE MEDICAL CARE IF:  You have breast discharge that is bloody or puslike.  You have  breast pain.  You feel a lump in your breast.  You have wrinkling or dimpling on your breast.  Your breast becomes red and swollen.   This information is not intended to replace advice given to you by your health care provider. Make sure you discuss any questions you have with your health care provider.   Document Released: 05/01/2004 Document Revised: 04/14/2014 Document Reviewed: 10/25/2013 Elsevier Interactive Patient Education [employer].
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
## 2015-01-25 NOTE — Addendum Note (Signed)
Addended by: Michel Harrow on: 01/25/2015 04:22 PM   Modules accepted: Orders

## 2015-01-26 DIAGNOSIS — M79671 Pain in right foot: Secondary | ICD-10-CM | POA: Diagnosis not present

## 2015-01-26 LAB — GC/CHLAMYDIA PROBE AMP (~~LOC~~) NOT AT ARMC
Chlamydia: NEGATIVE
Neisseria Gonorrhea: NEGATIVE

## 2015-01-26 LAB — BASIC METABOLIC PANEL
Anion gap: 15 (ref 5–15)
BUN: 66 mg/dL — ABNORMAL HIGH (ref 6–20)
CALCIUM: 8.8 mg/dL — AB (ref 8.9–10.3)
CO2: 25 mmol/L (ref 22–32)
CREATININE: 13.84 mg/dL — AB (ref 0.44–1.00)
Chloride: 95 mmol/L — ABNORMAL LOW (ref 101–111)
GFR calc non Af Amer: 3 mL/min — ABNORMAL LOW (ref >60)
GFR, EST AFRICAN AMERICAN: 4 mL/min — AB (ref >60)
GLUCOSE: 102 mg/dL — AB (ref 65–99)
Potassium: 4.9 mmol/L (ref 3.5–5.1)
Sodium: 135 mmol/L (ref 135–145)

## 2015-01-26 LAB — T4, FREE: Free T4: 0.99 ng/dL (ref 0.80–1.80)

## 2015-01-26 LAB — PROLACTIN: Prolactin: 22.2 ng/mL

## 2015-01-26 LAB — WET PREP, GENITAL
Trich, Wet Prep: NONE SEEN
WBC, Wet Prep HPF POC: NONE SEEN
Yeast Wet Prep HPF POC: NONE SEEN

## 2015-01-26 LAB — T3, FREE: T3, Free: 1.8 pg/mL — ABNORMAL LOW (ref 2.3–4.2)

## 2015-01-26 LAB — TSH: TSH: 0.624 u[IU]/mL (ref 0.350–4.500)

## 2015-01-27 ENCOUNTER — Encounter (HOSPITAL_COMMUNITY): Payer: Self-pay | Admitting: Emergency Medicine

## 2015-01-27 ENCOUNTER — Emergency Department (HOSPITAL_COMMUNITY)
Admission: EM | Admit: 2015-01-27 | Discharge: 2015-01-27 | Disposition: A | Discharge status: Home / Self Care | Payer: Medicare Other | Attending: Emergency Medicine | Admitting: Emergency Medicine

## 2015-01-27 DIAGNOSIS — Z862 Personal history of diseases of the blood and blood-forming organs and certain disorders involving the immune mechanism: Secondary | ICD-10-CM | POA: Diagnosis not present

## 2015-01-27 DIAGNOSIS — Z992 Dependence on renal dialysis: Secondary | ICD-10-CM

## 2015-01-27 DIAGNOSIS — Z7952 Long term (current) use of systemic steroids: Secondary | ICD-10-CM | POA: Insufficient documentation

## 2015-01-27 DIAGNOSIS — Z793 Long term (current) use of hormonal contraceptives: Secondary | ICD-10-CM | POA: Diagnosis not present

## 2015-01-27 DIAGNOSIS — Z8659 Personal history of other mental and behavioral disorders: Secondary | ICD-10-CM | POA: Diagnosis not present

## 2015-01-27 DIAGNOSIS — Z720 Tobacco use: Secondary | ICD-10-CM | POA: Insufficient documentation

## 2015-01-27 DIAGNOSIS — M25562 Pain in left knee: Secondary | ICD-10-CM | POA: Diagnosis not present

## 2015-01-27 DIAGNOSIS — N186 End stage renal disease: Secondary | ICD-10-CM | POA: Diagnosis not present

## 2015-01-27 DIAGNOSIS — M25561 Pain in right knee: Secondary | ICD-10-CM | POA: Diagnosis not present

## 2015-01-27 DIAGNOSIS — Z4931 Encounter for adequacy testing for hemodialysis: Secondary | ICD-10-CM | POA: Diagnosis not present

## 2015-01-27 DIAGNOSIS — I509 Heart failure, unspecified: Secondary | ICD-10-CM | POA: Insufficient documentation

## 2015-01-27 DIAGNOSIS — I120 Hypertensive chronic kidney disease with stage 5 chronic kidney disease or end stage renal disease: Secondary | ICD-10-CM | POA: Diagnosis not present

## 2015-01-27 MED ORDER — SODIUM CHLORIDE 0.9 % IV SOLN: 100.0000 mL | INTRAVENOUS | Status: DC | PRN

## 2015-01-27 MED ORDER — ALTEPLASE 2 MG IJ SOLR: 2.0000 mg | Freq: Once | INTRAMUSCULAR | Status: DC | PRN

## 2015-01-27 MED ORDER — HEPARIN SODIUM (PORCINE) 1000 UNIT/ML DIALYSIS: 1000.0000 [IU] | INTRAMUSCULAR | Status: DC | PRN

## 2015-01-27 MED ORDER — LIDOCAINE HCL (PF) 1 % IJ SOLN: 5.0000 mL | INTRAMUSCULAR | Status: DC | PRN

## 2015-01-27 MED ORDER — PENTAFLUOROPROP-TETRAFLUOROETH EX AERO: 1.0000 "application " | INHALATION_SPRAY | CUTANEOUS | Status: DC | PRN

## 2015-01-27 MED ORDER — HEPARIN SODIUM (PORCINE) 1000 UNIT/ML DIALYSIS: 5000.0000 [IU] | Freq: Once | INTRAMUSCULAR | Status: DC

## 2015-01-27 MED ORDER — HYDROCODONE-ACETAMINOPHEN 5-325 MG PO TABS
2.0000 | ORAL_TABLET | Freq: Once | ORAL | Status: AC
  Administered 2015-01-27: 2 via ORAL
  Filled 2015-01-27: qty 2

## 2015-01-27 MED ORDER — LIDOCAINE-PRILOCAINE 2.5-2.5 % EX CREA: 1.0000 "application " | TOPICAL_CREAM | CUTANEOUS | Status: DC | PRN

## 2015-01-27 NOTE — ED Notes (Signed)
Pt. Here for dialysis . Last treastment was Tuesday Oct.18.Full treatment.
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
## 2015-01-27 NOTE — ED Notes (Signed)
Lunch tray ordered 

## 2015-01-29 LAB — CYTOLOGY - PAP

## 2015-01-30 ENCOUNTER — Encounter (HOSPITAL_COMMUNITY): Payer: Self-pay | Admitting: Emergency Medicine

## 2015-01-30 ENCOUNTER — Emergency Department (HOSPITAL_COMMUNITY)
Admission: EM | Admit: 2015-01-30 | Discharge: 2015-01-30 | Disposition: A | Discharge status: Home / Self Care | Payer: Medicare Other | Attending: Emergency Medicine | Admitting: Emergency Medicine

## 2015-01-30 DIAGNOSIS — R011 Cardiac murmur, unspecified: Secondary | ICD-10-CM | POA: Insufficient documentation

## 2015-01-30 DIAGNOSIS — N186 End stage renal disease: Secondary | ICD-10-CM

## 2015-01-30 DIAGNOSIS — Z79899 Other long term (current) drug therapy: Secondary | ICD-10-CM | POA: Insufficient documentation

## 2015-01-30 DIAGNOSIS — Z992 Dependence on renal dialysis: Secondary | ICD-10-CM | POA: Insufficient documentation

## 2015-01-30 DIAGNOSIS — Z4931 Encounter for adequacy testing for hemodialysis: Secondary | ICD-10-CM | POA: Insufficient documentation

## 2015-01-30 DIAGNOSIS — Z720 Tobacco use: Secondary | ICD-10-CM | POA: Insufficient documentation

## 2015-01-30 DIAGNOSIS — Z862 Personal history of diseases of the blood and blood-forming organs and certain disorders involving the immune mechanism: Secondary | ICD-10-CM | POA: Insufficient documentation

## 2015-01-30 DIAGNOSIS — Z8659 Personal history of other mental and behavioral disorders: Secondary | ICD-10-CM | POA: Insufficient documentation

## 2015-01-30 DIAGNOSIS — I120 Hypertensive chronic kidney disease with stage 5 chronic kidney disease or end stage renal disease: Secondary | ICD-10-CM | POA: Insufficient documentation

## 2015-01-30 DIAGNOSIS — M25141 Fistula, right hand: Secondary | ICD-10-CM | POA: Diagnosis not present

## 2015-01-30 DIAGNOSIS — I509 Heart failure, unspecified: Secondary | ICD-10-CM | POA: Diagnosis not present

## 2015-01-30 LAB — RENAL FUNCTION PANEL
Albumin: 2.7 g/dL — ABNORMAL LOW (ref 3.5–5.0)
Anion gap: 15 (ref 5–15)
BUN: 63 mg/dL — AB (ref 6–20)
CHLORIDE: 102 mmol/L (ref 101–111)
CO2: 22 mmol/L (ref 22–32)
CREATININE: 12.97 mg/dL — AB (ref 0.44–1.00)
Calcium: 9.3 mg/dL (ref 8.9–10.3)
GFR, EST AFRICAN AMERICAN: 4 mL/min — AB (ref >60)
GFR, EST NON AFRICAN AMERICAN: 3 mL/min — AB (ref >60)
Glucose, Bld: 117 mg/dL — ABNORMAL HIGH (ref 65–99)
POTASSIUM: 5.1 mmol/L (ref 3.5–5.1)
Phosphorus: 12.4 mg/dL — ABNORMAL HIGH (ref 2.5–4.6)
Sodium: 139 mmol/L (ref 135–145)

## 2015-01-30 LAB — CBC
HEMATOCRIT: 21.5 % — AB (ref 36.0–46.0)
Hemoglobin: 6.8 g/dL — CL (ref 12.0–15.0)
MCH: 27.6 pg (ref 26.0–34.0)
MCHC: 31.6 g/dL (ref 30.0–36.0)
MCV: 87.4 fL (ref 78.0–100.0)
PLATELETS: 202 10*3/uL (ref 150–400)
RBC: 2.46 MIL/uL — ABNORMAL LOW (ref 3.87–5.11)
RDW: 14.9 % (ref 11.5–15.5)
WBC: 4.4 10*3/uL (ref 4.0–10.5)

## 2015-01-30 MED ORDER — SODIUM CHLORIDE 0.9 % IV SOLN
510.0000 mg | Freq: Once | INTRAVENOUS | Status: DC
  Filled 2015-01-30 (×2): qty 17

## 2015-01-30 MED ORDER — DARBEPOETIN ALFA 100 MCG/0.5ML IJ SOSY
100.0000 ug | PREFILLED_SYRINGE | INTRAMUSCULAR | Status: DC
  Administered 2015-01-30: 100 ug via INTRAVENOUS

## 2015-01-30 MED ORDER — ACETAMINOPHEN 325 MG PO TABS
650.0000 mg | ORAL_TABLET | Freq: Four times a day (QID) | ORAL | Status: DC | PRN
  Administered 2015-01-30: 650 mg via ORAL

## 2015-01-30 MED ORDER — DARBEPOETIN ALFA 100 MCG/0.5ML IJ SOSY
PREFILLED_SYRINGE | INTRAMUSCULAR | Status: AC
  Filled 2015-01-30: qty 0.5

## 2015-01-30 MED ORDER — ACETAMINOPHEN 325 MG PO TABS
ORAL_TABLET | ORAL | Status: AC
  Filled 2015-01-30: qty 2

## 2015-01-30 NOTE — Procedures (Signed)
Pt seen on HD.  She wants to pull 5L.  Hg 6.8.  Denies any acute blood loss, specifically no melena or hematochezia.  She has not received aranesp in over a month.  Asked if she wanted a transfusion but she declined.  She needs to receive aranesp weekly.  Will also give a dose of feraheme.

## 2015-01-30 NOTE — ED Notes (Signed)
Pt presents here for dialysis. No other complaints at this time.

## 2015-01-30 NOTE — ED Notes (Signed)
Pt received meal tray with sausage, pancakes, and grits.

## 2015-01-30 NOTE — Progress Notes (Signed)
Patient completed treatment. No verbalized concerns. Vital signs are stable. Patient refused iron with treatment. Patient stated that iron will make her nauseated. Patient discharged home after HD.

## 2015-01-30 NOTE — ED Notes (Signed)
Gave pt coffee, 3 warm blankets, and heat pack. Pt has ordered breakfast tray at this time. Pt resting in bed

## 2015-01-30 NOTE — Progress Notes (Signed)
CRITICAL VALUE ALERT  Critical value received:  1207  Date of notification:  01/30/2015  Time of notification:  1207  Critical value read back:yes  Nurse who received alert: Kennon Rounds  MD notified (1st page): Dr. Fleet Contras  Time of first page: 84  MD notified (2nd page):  Time of second page:  Responding MD:  Dr. Fleet Contras  Time MD responded:  [phone]

## 2015-01-30 NOTE — ED Notes (Signed)
Pt calling service response stating she has not had a meal tray. Pt did receive meal tray. RN, NT and secretary saw meal tray and NT warmed up meal tray for pt.
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
## 2015-01-31 LAB — HEPATITIS B SURFACE ANTIGEN: Hepatitis B Surface Ag: NEGATIVE

## 2015-02-01 ENCOUNTER — Emergency Department (HOSPITAL_COMMUNITY)
Admission: EM | Admit: 2015-02-01 | Discharge: 2015-02-03 | Disposition: A | Discharge status: Home / Self Care | Payer: Medicare Other | Attending: Emergency Medicine | Admitting: Emergency Medicine

## 2015-02-01 ENCOUNTER — Encounter (HOSPITAL_COMMUNITY): Payer: Self-pay | Admitting: *Deleted

## 2015-02-01 DIAGNOSIS — I120 Hypertensive chronic kidney disease with stage 5 chronic kidney disease or end stage renal disease: Secondary | ICD-10-CM | POA: Insufficient documentation

## 2015-02-01 DIAGNOSIS — N186 End stage renal disease: Secondary | ICD-10-CM | POA: Diagnosis not present

## 2015-02-01 DIAGNOSIS — Z79899 Other long term (current) drug therapy: Secondary | ICD-10-CM | POA: Diagnosis not present

## 2015-02-01 DIAGNOSIS — N189 Chronic kidney disease, unspecified: Secondary | ICD-10-CM

## 2015-02-01 DIAGNOSIS — Z992 Dependence on renal dialysis: Secondary | ICD-10-CM | POA: Insufficient documentation

## 2015-02-01 DIAGNOSIS — Z7952 Long term (current) use of systemic steroids: Secondary | ICD-10-CM | POA: Insufficient documentation

## 2015-02-01 DIAGNOSIS — Z8739 Personal history of other diseases of the musculoskeletal system and connective tissue: Secondary | ICD-10-CM | POA: Diagnosis not present

## 2015-02-01 DIAGNOSIS — R05 Cough: Secondary | ICD-10-CM | POA: Insufficient documentation

## 2015-02-01 DIAGNOSIS — Z720 Tobacco use: Secondary | ICD-10-CM | POA: Diagnosis not present

## 2015-02-01 DIAGNOSIS — Z862 Personal history of diseases of the blood and blood-forming organs and certain disorders involving the immune mechanism: Secondary | ICD-10-CM | POA: Diagnosis not present

## 2015-02-01 DIAGNOSIS — Z8659 Personal history of other mental and behavioral disorders: Secondary | ICD-10-CM | POA: Diagnosis not present

## 2015-02-01 DIAGNOSIS — R238 Other skin changes: Secondary | ICD-10-CM | POA: Diagnosis not present

## 2015-02-01 DIAGNOSIS — I509 Heart failure, unspecified: Secondary | ICD-10-CM | POA: Insufficient documentation

## 2015-02-01 DIAGNOSIS — M549 Dorsalgia, unspecified: Secondary | ICD-10-CM | POA: Diagnosis present

## 2015-02-01 LAB — CBC
HCT: 24.3 % — ABNORMAL LOW (ref 36.0–46.0)
Hemoglobin: 7.6 g/dL — ABNORMAL LOW (ref 12.0–15.0)
MCH: 27.7 pg (ref 26.0–34.0)
MCHC: 31.3 g/dL (ref 30.0–36.0)
MCV: 88.7 fL (ref 78.0–100.0)
PLATELETS: 213 10*3/uL (ref 150–400)
RBC: 2.74 MIL/uL — ABNORMAL LOW (ref 3.87–5.11)
RDW: 15.1 % (ref 11.5–15.5)
WBC: 4 10*3/uL (ref 4.0–10.5)

## 2015-02-01 LAB — TRANSFERRIN: TRANSFERRIN: 146 mg/dL — AB (ref 192–382)

## 2015-02-01 LAB — RENAL FUNCTION PANEL
Albumin: 2.6 g/dL — ABNORMAL LOW (ref 3.5–5.0)
Anion gap: 15 (ref 5–15)
BUN: 49 mg/dL — AB (ref 6–20)
CHLORIDE: 99 mmol/L — AB (ref 101–111)
CO2: 24 mmol/L (ref 22–32)
CREATININE: 10.87 mg/dL — AB (ref 0.44–1.00)
Calcium: 9.5 mg/dL (ref 8.9–10.3)
GFR calc Af Amer: 5 mL/min — ABNORMAL LOW (ref >60)
GFR, EST NON AFRICAN AMERICAN: 4 mL/min — AB (ref >60)
Glucose, Bld: 121 mg/dL — ABNORMAL HIGH (ref 65–99)
Phosphorus: 9 mg/dL — ABNORMAL HIGH (ref 2.5–4.6)
Potassium: 4.6 mmol/L (ref 3.5–5.1)
SODIUM: 138 mmol/L (ref 135–145)

## 2015-02-01 MED ORDER — HYDROCODONE-ACETAMINOPHEN 5-325 MG PO TABS
1.0000 | ORAL_TABLET | Freq: Once | ORAL | Status: AC
  Administered 2015-02-01: 1 via ORAL
  Filled 2015-02-01: qty 1

## 2015-02-01 MED ORDER — BACITRACIN ZINC 500 UNIT/GM EX OINT: TOPICAL_OINTMENT | Freq: Once | CUTANEOUS | Status: DC

## 2015-02-01 NOTE — ED Notes (Signed)
Pt is here for routine dialysis, back pain, and cough
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
## 2015-02-02 ENCOUNTER — Encounter: Payer: Self-pay | Admitting: Pharmacist

## 2015-02-05 ENCOUNTER — Emergency Department (HOSPITAL_COMMUNITY)
Admission: EM | Admit: 2015-02-05 | Discharge: 2015-02-05 | Disposition: A | Discharge status: Home / Self Care | Payer: Medicare Other | Attending: Emergency Medicine | Admitting: Emergency Medicine

## 2015-02-05 ENCOUNTER — Encounter (HOSPITAL_COMMUNITY): Payer: Self-pay | Admitting: *Deleted

## 2015-02-05 DIAGNOSIS — Z4931 Encounter for adequacy testing for hemodialysis: Secondary | ICD-10-CM | POA: Insufficient documentation

## 2015-02-05 DIAGNOSIS — Z8659 Personal history of other mental and behavioral disorders: Secondary | ICD-10-CM | POA: Diagnosis not present

## 2015-02-05 DIAGNOSIS — R0602 Shortness of breath: Secondary | ICD-10-CM | POA: Diagnosis present

## 2015-02-05 DIAGNOSIS — Z862 Personal history of diseases of the blood and blood-forming organs and certain disorders involving the immune mechanism: Secondary | ICD-10-CM | POA: Insufficient documentation

## 2015-02-05 DIAGNOSIS — N186 End stage renal disease: Secondary | ICD-10-CM | POA: Diagnosis not present

## 2015-02-05 DIAGNOSIS — I120 Hypertensive chronic kidney disease with stage 5 chronic kidney disease or end stage renal disease: Secondary | ICD-10-CM | POA: Diagnosis not present

## 2015-02-05 DIAGNOSIS — I509 Heart failure, unspecified: Secondary | ICD-10-CM | POA: Insufficient documentation

## 2015-02-05 DIAGNOSIS — H05229 Edema of unspecified orbit: Secondary | ICD-10-CM | POA: Diagnosis not present

## 2015-02-05 DIAGNOSIS — Z992 Dependence on renal dialysis: Secondary | ICD-10-CM | POA: Diagnosis not present

## 2015-02-05 DIAGNOSIS — Z8739 Personal history of other diseases of the musculoskeletal system and connective tissue: Secondary | ICD-10-CM | POA: Diagnosis not present

## 2015-02-05 DIAGNOSIS — R220 Localized swelling, mass and lump, head: Secondary | ICD-10-CM | POA: Diagnosis not present

## 2015-02-05 DIAGNOSIS — Z720 Tobacco use: Secondary | ICD-10-CM | POA: Insufficient documentation

## 2015-02-05 LAB — I-STAT CHEM 8, ED
BUN: 68 mg/dL — ABNORMAL HIGH (ref 6–20)
CHLORIDE: 108 mmol/L (ref 101–111)
Calcium, Ion: 1.09 mmol/L — ABNORMAL LOW (ref 1.12–1.23)
Creatinine, Ser: 15.9 mg/dL — ABNORMAL HIGH (ref 0.44–1.00)
GLUCOSE: 77 mg/dL (ref 65–99)
HCT: 26 % — ABNORMAL LOW (ref 36.0–46.0)
Hemoglobin: 8.8 g/dL — ABNORMAL LOW (ref 12.0–15.0)
POTASSIUM: 5.1 mmol/L (ref 3.5–5.1)
SODIUM: 139 mmol/L (ref 135–145)
TCO2: 22 mmol/L (ref 0–100)

## 2015-02-05 MED ORDER — ACETAMINOPHEN 325 MG PO TABS
650.0000 mg | ORAL_TABLET | Freq: Once | ORAL | Status: AC
  Administered 2015-02-05: 650 mg via ORAL

## 2015-02-05 MED ORDER — HEPARIN SODIUM (PORCINE) 1000 UNIT/ML DIALYSIS
100.0000 [IU]/kg | INTRAMUSCULAR | Status: DC | PRN
  Filled 2015-02-05: qty 7

## 2015-02-05 MED ORDER — ACETAMINOPHEN 325 MG PO TABS
ORAL_TABLET | ORAL | Status: AC
  Filled 2015-02-05: qty 2

## 2015-02-05 NOTE — ED Notes (Signed)
MD at bedside. 

## 2015-02-05 NOTE — Procedures (Signed)
I was present at this dialysis session. I have reviewed the session itself and made appropriate changes.  Pt denies complaints.  UF goal 5L.   Pearson Grippe  MD 02/05/2015, 4:00 PM

## 2015-02-05 NOTE — ED Notes (Signed)
States dry weight is 125#.
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
## 2015-02-05 NOTE — ED Provider Notes (Signed)
Patient presented to the ER with need for dialysis. Patient is a chronic end-stage renal disease patient on dialysis Tuesday Thursday Saturday. She was unable to be dialyzed on Saturday. She is not experiencing any shortness of breath. She is without complaints.  Face to face Exam: HEENT - PERRLA Lungs - CTAB Heart - RRR, no M/R/G Abd - S/NT/ND Neuro - alert, oriented x3  Plan: Patient has missed her dialysis session, will consult nephrology for possible dialysis. She is not in any distress currently.  Orpah Greek, MD 02/05/15 1131

## 2015-02-05 NOTE — ED Notes (Signed)
Pt presents via POV requesting dialysis, no other complaints.  A x 4, NAD. Last dialysis Thursday.

## 2015-02-05 NOTE — Progress Notes (Signed)
Patient completed her prescribed treatment. Goal met of 5kg,  Post weight of 57.7kg. All vital signs are stable. Patient to take blood pressure medication once she is home. No verbalized concerns at discharge. Patient receives outpatient HD treatments.

## 2015-02-07 ENCOUNTER — Encounter (HOSPITAL_COMMUNITY): Payer: Self-pay | Admitting: Neurology

## 2015-02-07 ENCOUNTER — Emergency Department (HOSPITAL_COMMUNITY): Payer: Medicare Other

## 2015-02-07 ENCOUNTER — Emergency Department (HOSPITAL_COMMUNITY)
Admission: EM | Admit: 2015-02-07 | Discharge: 2015-02-07 | Discharge status: Against Medical Advice | Payer: Medicare Other | Attending: Emergency Medicine | Admitting: Emergency Medicine

## 2015-02-07 DIAGNOSIS — I120 Hypertensive chronic kidney disease with stage 5 chronic kidney disease or end stage renal disease: Secondary | ICD-10-CM | POA: Insufficient documentation

## 2015-02-07 DIAGNOSIS — F319 Bipolar disorder, unspecified: Secondary | ICD-10-CM | POA: Insufficient documentation

## 2015-02-07 DIAGNOSIS — J209 Acute bronchitis, unspecified: Secondary | ICD-10-CM | POA: Diagnosis not present

## 2015-02-07 DIAGNOSIS — R05 Cough: Secondary | ICD-10-CM | POA: Diagnosis not present

## 2015-02-07 DIAGNOSIS — Z862 Personal history of diseases of the blood and blood-forming organs and certain disorders involving the immune mechanism: Secondary | ICD-10-CM | POA: Diagnosis not present

## 2015-02-07 DIAGNOSIS — N186 End stage renal disease: Secondary | ICD-10-CM | POA: Diagnosis not present

## 2015-02-07 DIAGNOSIS — Z992 Dependence on renal dialysis: Secondary | ICD-10-CM | POA: Diagnosis not present

## 2015-02-07 DIAGNOSIS — Z793 Long term (current) use of hormonal contraceptives: Secondary | ICD-10-CM | POA: Insufficient documentation

## 2015-02-07 DIAGNOSIS — Z720 Tobacco use: Secondary | ICD-10-CM | POA: Insufficient documentation

## 2015-02-07 DIAGNOSIS — R0602 Shortness of breath: Secondary | ICD-10-CM | POA: Diagnosis not present

## 2015-02-07 DIAGNOSIS — I509 Heart failure, unspecified: Secondary | ICD-10-CM | POA: Diagnosis not present
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
## 2015-02-07 NOTE — ED Notes (Signed)
Pt is here for dialysis. Last treatment was Monday, she missed on Saturday. Pt reports has been coughing up mucus and has left hip pain, reports this findings occur when she needs dialysis.

## 2015-02-07 NOTE — ED Notes (Signed)
Patient transported to X-ray 

## 2015-02-07 NOTE — ED Notes (Signed)
Pt leaving AMA- sts she will return tomorrow for dialysis

## 2015-02-07 NOTE — ED Notes (Signed)
Called dialysis to make aware pt is here in the ED for dialysis. Report they will fit her in today but is unsure what time. Will continue to stay in touch with dialysis and MD made aware.

## 2015-02-07 NOTE — ED Notes (Signed)
Called dialysis, estimates patient will come this afternoon for dialysis. Unable to provide definitive time.

## 2015-02-09 ENCOUNTER — Emergency Department (HOSPITAL_COMMUNITY)
Admission: EM | Admit: 2015-02-09 | Discharge: 2015-02-10 | Disposition: A | Discharge status: Home / Self Care | Payer: Medicare Other | Attending: Emergency Medicine | Admitting: Emergency Medicine

## 2015-02-09 ENCOUNTER — Encounter (HOSPITAL_COMMUNITY): Payer: Self-pay

## 2015-02-09 DIAGNOSIS — Z8659 Personal history of other mental and behavioral disorders: Secondary | ICD-10-CM | POA: Insufficient documentation

## 2015-02-09 DIAGNOSIS — I509 Heart failure, unspecified: Secondary | ICD-10-CM | POA: Insufficient documentation

## 2015-02-09 DIAGNOSIS — Z992 Dependence on renal dialysis: Secondary | ICD-10-CM | POA: Insufficient documentation

## 2015-02-09 DIAGNOSIS — Z862 Personal history of diseases of the blood and blood-forming organs and certain disorders involving the immune mechanism: Secondary | ICD-10-CM | POA: Insufficient documentation

## 2015-02-09 DIAGNOSIS — I120 Hypertensive chronic kidney disease with stage 5 chronic kidney disease or end stage renal disease: Secondary | ICD-10-CM | POA: Diagnosis not present

## 2015-02-09 DIAGNOSIS — N186 End stage renal disease: Secondary | ICD-10-CM | POA: Diagnosis not present

## 2015-02-09 DIAGNOSIS — Z720 Tobacco use: Secondary | ICD-10-CM | POA: Insufficient documentation

## 2015-02-09 DIAGNOSIS — R0602 Shortness of breath: Secondary | ICD-10-CM | POA: Diagnosis not present

## 2015-02-09 DIAGNOSIS — R52 Pain, unspecified: Secondary | ICD-10-CM | POA: Diagnosis present

## 2015-02-09 DIAGNOSIS — R220 Localized swelling, mass and lump, head: Secondary | ICD-10-CM | POA: Diagnosis not present

## 2015-02-09 LAB — I-STAT CHEM 8, ED
BUN: 85 mg/dL — ABNORMAL HIGH (ref 6–20)
Calcium, Ion: 1.12 mmol/L (ref 1.12–1.23)
Chloride: 101 mmol/L (ref 101–111)
Creatinine, Ser: 15.7 mg/dL — ABNORMAL HIGH (ref 0.44–1.00)
Glucose, Bld: 78 mg/dL (ref 65–99)
HEMATOCRIT: 22 % — AB (ref 36.0–46.0)
HEMOGLOBIN: 7.5 g/dL — AB (ref 12.0–15.0)
POTASSIUM: 4.6 mmol/L (ref 3.5–5.1)
Sodium: 137 mmol/L (ref 135–145)
TCO2: 21 mmol/L (ref 0–100)

## 2015-02-09 MED ORDER — HYDROCODONE-ACETAMINOPHEN 5-325 MG PO TABS
1.0000 | ORAL_TABLET | Freq: Once | ORAL | Status: AC
  Administered 2015-02-09: 1 via ORAL
  Filled 2015-02-09: qty 1

## 2015-02-09 MED ORDER — SODIUM CHLORIDE 0.9 % IV SOLN
250.0000 mg | Freq: Once | INTRAVENOUS | Status: AC
  Administered 2015-02-09: 250 mg via INTRAVENOUS
  Filled 2015-02-09: qty 20

## 2015-02-09 MED ORDER — DARBEPOETIN ALFA 100 MCG/0.5ML IJ SOSY
100.0000 ug | PREFILLED_SYRINGE | Freq: Once | INTRAMUSCULAR | Status: AC
  Administered 2015-02-09: 100 ug via INTRAVENOUS
  Filled 2015-02-09: qty 0.5

## 2015-02-09 MED ORDER — DARBEPOETIN ALFA 100 MCG/0.5ML IJ SOSY
PREFILLED_SYRINGE | INTRAMUSCULAR | Status: AC
  Filled 2015-02-09: qty 0.5

## 2015-02-09 NOTE — Progress Notes (Signed)
Patient discharged home with belongings . Patient signed off ama received 3.15 of 4 hour tx. Removed 3941ml of 5 Liter goal.

## 2015-02-09 NOTE — ED Notes (Signed)
Pt. Here for dialysis treatment. Pt. Has not received dialysis tx since Monday.  Pt. C/o pain bilateral shoulders, left foot, and left hip.
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
## 2015-02-13 ENCOUNTER — Emergency Department (HOSPITAL_COMMUNITY)
Admission: EM | Admit: 2015-02-13 | Discharge: 2015-02-14 | Disposition: A | Discharge status: Home / Self Care | Payer: Medicare Other | Attending: Emergency Medicine | Admitting: Emergency Medicine

## 2015-02-13 ENCOUNTER — Encounter (HOSPITAL_COMMUNITY): Payer: Self-pay | Admitting: Vascular Surgery

## 2015-02-13 DIAGNOSIS — F319 Bipolar disorder, unspecified: Secondary | ICD-10-CM | POA: Insufficient documentation

## 2015-02-13 DIAGNOSIS — F209 Schizophrenia, unspecified: Secondary | ICD-10-CM | POA: Insufficient documentation

## 2015-02-13 DIAGNOSIS — Z79899 Other long term (current) drug therapy: Secondary | ICD-10-CM | POA: Insufficient documentation

## 2015-02-13 DIAGNOSIS — Z992 Dependence on renal dialysis: Secondary | ICD-10-CM | POA: Diagnosis not present

## 2015-02-13 DIAGNOSIS — Z720 Tobacco use: Secondary | ICD-10-CM | POA: Insufficient documentation

## 2015-02-13 DIAGNOSIS — I509 Heart failure, unspecified: Secondary | ICD-10-CM | POA: Diagnosis not present

## 2015-02-13 DIAGNOSIS — I120 Hypertensive chronic kidney disease with stage 5 chronic kidney disease or end stage renal disease: Secondary | ICD-10-CM | POA: Diagnosis not present

## 2015-02-13 DIAGNOSIS — N185 Chronic kidney disease, stage 5: Secondary | ICD-10-CM | POA: Diagnosis not present

## 2015-02-13 DIAGNOSIS — Z862 Personal history of diseases of the blood and blood-forming organs and certain disorders involving the immune mechanism: Secondary | ICD-10-CM | POA: Diagnosis not present

## 2015-02-13 DIAGNOSIS — N186 End stage renal disease: Secondary | ICD-10-CM | POA: Diagnosis not present

## 2015-02-13 LAB — I-STAT CHEM 8, ED
BUN: 90 mg/dL — ABNORMAL HIGH (ref 6–20)
CREATININE: 17.3 mg/dL — AB (ref 0.44–1.00)
Calcium, Ion: 1.06 mmol/L — ABNORMAL LOW (ref 1.12–1.23)
Chloride: 98 mmol/L — ABNORMAL LOW (ref 101–111)
Glucose, Bld: 107 mg/dL — ABNORMAL HIGH (ref 65–99)
HEMATOCRIT: 24 % — AB (ref 36.0–46.0)
HEMOGLOBIN: 8.2 g/dL — AB (ref 12.0–15.0)
Potassium: 5.4 mmol/L — ABNORMAL HIGH (ref 3.5–5.1)
SODIUM: 135 mmol/L (ref 135–145)
TCO2: 22 mmol/L (ref 0–100)

## 2015-02-13 MED ORDER — HYDROCODONE-ACETAMINOPHEN 5-325 MG PO TABS
1.0000 | ORAL_TABLET | Freq: Once | ORAL | Status: AC
  Administered 2015-02-13: 1 via ORAL
  Filled 2015-02-13: qty 1

## 2015-02-13 NOTE — ED Notes (Signed)
[name], dialysis tech called to say pt could come up.
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
## 2015-02-13 NOTE — ED Notes (Signed)
Pt reports to the ED for dialysis. Pt reports her last dialysis was on Friday 11/4. Denies any CP or SOB. Also complaining of some chronic hip pain. Pt A&Ox4, resp e/u, and skin warm and dry. Pt ambulatory. Requesting social work be called.

## 2015-02-14 ENCOUNTER — Other Ambulatory Visit: Payer: Self-pay | Admitting: Internal Medicine

## 2015-02-15 ENCOUNTER — Emergency Department (HOSPITAL_COMMUNITY)
Admission: EM | Admit: 2015-02-15 | Discharge: 2015-02-15 | Disposition: A | Discharge status: Home / Self Care | Payer: Medicare Other

## 2015-02-16 ENCOUNTER — Encounter (HOSPITAL_COMMUNITY): Payer: Self-pay | Admitting: Emergency Medicine

## 2015-02-16 ENCOUNTER — Observation Stay (HOSPITAL_COMMUNITY)
Admission: EM | Admit: 2015-02-16 | Discharge: 2015-02-16 | Disposition: A | Discharge status: Home / Self Care | Payer: Medicare Other | Attending: Family Medicine | Admitting: Family Medicine

## 2015-02-16 ENCOUNTER — Ambulatory Visit: Payer: Self-pay | Admitting: Family Medicine

## 2015-02-16 DIAGNOSIS — I309 Acute pericarditis, unspecified: Secondary | ICD-10-CM | POA: Diagnosis not present

## 2015-02-16 DIAGNOSIS — I509 Heart failure, unspecified: Secondary | ICD-10-CM | POA: Insufficient documentation

## 2015-02-16 DIAGNOSIS — Z992 Dependence on renal dialysis: Secondary | ICD-10-CM | POA: Insufficient documentation

## 2015-02-16 DIAGNOSIS — F209 Schizophrenia, unspecified: Secondary | ICD-10-CM | POA: Diagnosis not present

## 2015-02-16 DIAGNOSIS — N189 Chronic kidney disease, unspecified: Secondary | ICD-10-CM | POA: Diagnosis present

## 2015-02-16 DIAGNOSIS — M25551 Pain in right hip: Secondary | ICD-10-CM | POA: Diagnosis not present

## 2015-02-16 DIAGNOSIS — F29 Unspecified psychosis not due to a substance or known physiological condition: Secondary | ICD-10-CM | POA: Insufficient documentation

## 2015-02-16 DIAGNOSIS — Z79899 Other long term (current) drug therapy: Secondary | ICD-10-CM | POA: Insufficient documentation

## 2015-02-16 DIAGNOSIS — N186 End stage renal disease: Secondary | ICD-10-CM | POA: Diagnosis not present

## 2015-02-16 DIAGNOSIS — F319 Bipolar disorder, unspecified: Secondary | ICD-10-CM | POA: Diagnosis not present

## 2015-02-16 DIAGNOSIS — I120 Hypertensive chronic kidney disease with stage 5 chronic kidney disease or end stage renal disease: Secondary | ICD-10-CM | POA: Insufficient documentation

## 2015-02-16 DIAGNOSIS — D649 Anemia, unspecified: Secondary | ICD-10-CM | POA: Diagnosis not present

## 2015-02-16 DIAGNOSIS — T8249XA Other complication of vascular dialysis catheter, initial encounter: Secondary | ICD-10-CM | POA: Diagnosis not present

## 2015-02-16 DIAGNOSIS — Y622 Failure of sterile precautions during kidney dialysis and other perfusion: Secondary | ICD-10-CM | POA: Insufficient documentation

## 2015-02-16 MED ORDER — OXYCODONE HCL 5 MG PO TABS
5.0000 mg | ORAL_TABLET | Freq: Once | ORAL | Status: AC
  Administered 2015-02-16: 5 mg via ORAL
  Filled 2015-02-16: qty 1

## 2015-02-16 MED ORDER — SODIUM CHLORIDE 0.9 % IV SOLN: 100.0000 mL | INTRAVENOUS | Status: DC | PRN

## 2015-02-16 MED ORDER — LIDOCAINE HCL (PF) 1 % IJ SOLN: 5.0000 mL | INTRAMUSCULAR | Status: DC | PRN

## 2015-02-16 MED ORDER — PENTAFLUOROPROP-TETRAFLUOROETH EX AERO: 1.0000 "application " | INHALATION_SPRAY | CUTANEOUS | Status: DC | PRN

## 2015-02-16 MED ORDER — HEPARIN SODIUM (PORCINE) 1000 UNIT/ML DIALYSIS: 20.0000 [IU]/kg | INTRAMUSCULAR | Status: DC | PRN

## 2015-02-16 MED ORDER — LIDOCAINE-PRILOCAINE 2.5-2.5 % EX CREA: 1.0000 "application " | TOPICAL_CREAM | CUTANEOUS | Status: DC | PRN

## 2015-02-16 MED ORDER — HEPARIN SODIUM (PORCINE) 1000 UNIT/ML DIALYSIS
1000.0000 [IU] | INTRAMUSCULAR | Status: DC | PRN
Start: 2015-02-16 — End: 2015-02-16

## 2015-02-16 MED ORDER — ALTEPLASE 2 MG IJ SOLR: 2.0000 mg | Freq: Once | INTRAMUSCULAR | Status: DC | PRN

## 2015-02-16 NOTE — ED Notes (Signed)
Pt called out and said "What are y'all going to do about my back pain?" this RN informed the pt that I would check her chart for orders. Pt was in bed sleeping prior to this. Pt asked to go to the restroom. Pt ambulated to the restroom independently.

## 2015-02-16 NOTE — ED Notes (Signed)
Admitting Docs in room with pt.

## 2015-02-16 NOTE — ED Notes (Signed)
Pt called out and stated "this pain medicine isn't working" pt educated that pain medicine hasn't had enough time to work.

## 2015-02-16 NOTE — ED Notes (Signed)
Pt. is here for her hemodialysis , pt. also stared right hip pain onset Monday this week , denies injury/ambulatory.
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
## 2015-02-16 NOTE — Discharge Summary (Signed)
[facility] Discharge Summary  Patient name: Sara [name] Medical record number: [id number] Date of birth: 1981/01/26 Age: 34 y.o. Gender: female Date of Admission: 02/16/2015  Date of Discharge: 02/16/2015 Admitting Physician: Lind Covert, MD  Primary Care Provider: Smiley Houseman, MD Consultants: Nephrology   Indication for Hospitalization: ESRD, dialysis   Discharge Diagnoses/Problem List:  Patient Active Problem List   Diagnosis Date Noted  . Encounter for dialysis ([place]) 02/16/2015  . End-stage renal disease on hemodialysis ([place])   . Hypertension with fluid overload 12/05/2014  . CHF (congestive heart failure) ([place]) 11/30/2014  . Non compliance w medication regimen 11/21/2014  . Schizoaffective disorder, bipolar type ([place]) 11/20/2014  . Bipolar affective disorder ([place])   . Schizophrenia ([place]) 02/20/2014  . 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  Skin: Excoriations on limbs and back.  Neuro: Alert and orientated    Brief Hospital Course:  Patient admitted for ESRD (end stage renal disease) on dialysis ([place]) due to Lupus Nephritis. Patient kicked out of dialysis in January. Receives dialysis TThSa in the [facility] ED. Patient was  euvolemic on admission.  Electrolytes relatively stable, potassium slightly elevated at 5.4. Creatinine elevated to 17.30. Last dialyzed  02/13/15. Dialysis unit full and unable to obtain dialysis until later this morning, hence placement in Observation. Patient also with right hip pain on admission. Hip X-ray with no acute abnormalities on 01/25/2015. Physical exam pain in right hip with internal/external rotation of left hip.   Issues for Follow Up:  1. Follow up patient's hip pain, patient discharged with Tylenol  Significant Procedures: None   Significant Labs and Imaging:   Recent Labs Lab 02/13/15 2137  HGB 8.2*  HCT 24.0*    Recent Labs Lab 02/13/15 2137  NA 135  K 5.4*  CL 98*  GLUCOSE 107*  BUN 90*  CREATININE 17.30*     Results/Tests Pending at Time of Discharge: None  Discharge Medications:    Medication List    TAKE these medications        acetaminophen 500 MG tablet  Commonly known as:  TYLENOL  Take 500 mg by mouth every 6 (six) hours as needed for mild pain.     ibuprofen 400 MG tablet  Commonly known as:  ADVIL,MOTRIN  Take 400 mg by mouth every 6 (six) hours as needed for mild pain.     metoprolol 50 MG tablet  Commonly known as:  LOPRESSOR  Take 1 tablet (50 mg total) by mouth 2 (two) times daily.     triamcinolone 0.025 % ointment  Commonly known as:  KENALOG  APPLY 1 APPLICATION TOPICALLY 2 (TWO) TIMES DAILY.      ASK your doctor about these medications        medroxyPROGESTERone 10 MG tablet  Commonly known as:  PROVERA  Take 1 tablet (10 mg total) by mouth daily.        Discharge Instructions: Please refer to  Patient Instructions section of EMR for full details.  Patient was counseled important signs and symptoms that should prompt return to medical care, changes in medications, dietary instructions, activity restrictions, and follow up appointments.   Follow-Up Appointments:     Follow-up Information    Follow up with Smiley Houseman, MD On 02/19/2015.   Specialty:  Family Medicine   Why:  Hospital follow-up @ 2PM   Contact information:   [place] [address] [phone]       Tonette Bihari, MD 02/16/2015, 12:09 PM PGY-1, [place]

## 2015-02-16 NOTE — ED Notes (Signed)
Pt states that heating packs were working well for her hip pain, pt requested more because the old heating packs had become cold, pt given more heating packs by this RN.

## 2015-02-16 NOTE — ED Notes (Signed)
Pt given heat packs to apply to right hip.

## 2015-02-16 NOTE — Discharge Instructions (Signed)
You were admitted for dialysis. You received dialysis. No further needs.

## 2015-02-16 NOTE — Telephone Encounter (Signed)
Pt is scheduled to see PCP on 02/19/15. Dace Denn, CMA.

## 2015-02-19 ENCOUNTER — Encounter: Payer: Self-pay | Admitting: Internal Medicine

## 2015-02-19 ENCOUNTER — Ambulatory Visit (INDEPENDENT_AMBULATORY_CARE_PROVIDER_SITE_OTHER): Payer: Medicare Other | Admitting: Internal Medicine

## 2015-02-19 VITALS — BP 160/92 | HR 99 | Temp 98.7°F | Wt 135.3 lb

## 2015-02-19 DIAGNOSIS — M329 Systemic lupus erythematosus, unspecified: Secondary | ICD-10-CM | POA: Diagnosis not present

## 2015-02-19 DIAGNOSIS — N2889 Other specified disorders of kidney and ureter: Secondary | ICD-10-CM

## 2015-02-19 DIAGNOSIS — M2550 Pain in unspecified joint: Secondary | ICD-10-CM

## 2015-02-19 DIAGNOSIS — I151 Hypertension secondary to other renal disorders: Secondary | ICD-10-CM

## 2015-02-19 MED ORDER — TRAMADOL HCL 50 MG PO TABS
50.0000 mg | ORAL_TABLET | Freq: Three times a day (TID) | ORAL | Status: DC | PRN
Start: 2015-02-19 — End: 2015-03-15

## 2015-02-19 MED ORDER — PREDNISONE 20 MG PO TABS: 40.0000 mg | ORAL_TABLET | Freq: Every day | ORAL | Status: DC

## 2015-02-19 NOTE — Progress Notes (Signed)
Patient ID: Sara [name], female   DOB: February 05, 1981, 34 y.o.   MRN: [id number] Date of Visit: 02/19/2015   HPI:  Right hip pain and generalized joint pain: - notes of joint pain that started about 6 months ago - pain is intermittent in hips R >L, low back pain, shoulder pain, knees  - currently her right hip is what is bothering her the most; notes her pain was so severe about 4 days ago that she was not able to walk. More specifically, the pain is located in buttock region around and right above the ischial tuberosity of right hip.  - has been trying Tylenol but not relieving any pain - notes in the past ED has given 1 pill of either Hydrocodone, Vicodin, Percocet, or Roxi which help  - no recent injury to hip  - hip pain has recently been more constant, worst with rest, better with activity - X-ray of R hip obtained on 01/25/2015 which showed no acute abnormality   HTN:  - noted to have elevated BP at 160/92 today - states she has not taken her Metoprolol today because she has dialysis at 4pm today; states she usually takes it afterwards - notes that usually is normal, but is unable to give ranges - denies chest pain, SOB, HA, blurred vision   ROS: See HPI.  [place]: reviewed; Lupus, Lupus Nephritis with ESRD   PHYSICAL EXAM: BP 160/92 mmHg  Pulse 99  Temp(Src) 98.7 F (37.1 C) (Oral)  Wt 135 lb 4.8 oz (61.372 kg)  SpO2 98% GEN: NAD CV: RRR, no murmurs, rubs, or gallops PULM: CTAB, normal effort ABD: Soft, nontender, nondistended, NABS, no organomegaly SKIN: excoriations on limbs and back  EXTR: No lower extremity edema or calf tenderness MSK: Hip: Tenderness to palpation of right ischial tuberosity and buttock area right above right ischial tuberosity. Right hip has normal ROM although flexion, abduction and adduction of leg causes pain in right upper buttock. Left Hip ROM is normal although abduction of right leg elicits discomfort of her right buttock area. Strength of LE  normal bilaterally. Normal sensation to light touch. Normal patellar reflexes.  Straight leg raise is negative. Shoulder: no asymmetry; normal ROM without pain today. No tenderness to palpation  Knees: no swelling bilaterally; no tenderness to palpation; normal ROM    PSYCH: Mood and affect euthymic, normal rate and volume of speech NEURO: Awake, alert, no focal deficits grossly, normal speech  ASSESSMENT/PLAN:  Health maintenance:  - asked to make annual physical visit to discuss health maintenance and HTN  HTN (hypertension) Uncontrolled. Did not take medication today as she has dialysis this afternoon; states she usually takes afterwards. Only taking Metoprolol. No other medications on her current med list. Per chart review has been on Amlodipine, Lasix, Imdur, Lisinopril. Patient states BP is usually controlled with Metoprolol.  - Continue Metoprolol - asked patient to make nursing visit tomorrow for BP check - asked to make follow up appointment if BP is uncontrolled   Pain, joint, multiple sites Intermittent pain at multiple sites including hips, knees, shoulder for the past 6 months. Currently R hip is bothering her the most. Xray of right hip is unremarkable for acute process. No signs/symptoms of radiculopathy. Hip pain possibly due inflammation of ischiogluteal bursa. Not relieved by Tylenol. Her intermittent joint pain may be due to her lupus. It seems she has multiple complications from her lupus. She has never seen a rheumatologist for her lupus.  - Tramadol 50mg  q 8hrs  PRN #40 - Prednisone 40mg  x 5 days (joint pain may be due to her lupus) - Referral to rheumatology made    FOLLOW UP: Asked to make annual physical visit to discuss health maintenance and HTN  Smiley Houseman, MD [place]

## 2015-02-19 NOTE — Assessment & Plan Note (Signed)
Intermittent pain at multiple sites including hips, knees, shoulder for the past 6 months. Currently R hip is bothering her the most. Xray of right hip is unremarkable for acute process. No signs/symptoms of radiculopathy. Hip pain possibly due inflammation of ischiogluteal bursa. Not relieved by Tylenol. Her intermittent joint pain may be due to her lupus. It seems she has multiple complications from her lupus. She has never seen a rheumatologist for her lupus.  - Tramadol 50mg  q 8hrs PRN #40 - Prednisone 40mg  x 5 days (joint pain may be due to her lupus) - Referral to rheumatology made

## 2015-02-19 NOTE — Patient Instructions (Signed)
You were seen for your hip pain and general joint pain. I think this is due to your lupus. Your xray of your hip looks normal.  - Lets try Tramadol as needed for pain - Let's also try a prednisone course for the joint pain - I made a referral to Rheumatology. This doctor is a specialist for Lupus. Hopefully getting your lupus managed will help with your skin and joint pain

## 2015-02-19 NOTE — Assessment & Plan Note (Addendum)
Uncontrolled. Did not take medication today as she has dialysis this afternoon; states she usually takes afterwards. Only taking Metoprolol. No other medications on her current med list. Per chart review has been on Amlodipine, Lasix, Imdur, Lisinopril. Patient states BP is usually controlled with Metoprolol.  - Continue Metoprolol - asked patient to make nursing visit tomorrow for BP check - asked to make follow up appointment if BP is uncontrolled

## 2015-02-20 ENCOUNTER — Encounter (HOSPITAL_COMMUNITY): Payer: Self-pay | Admitting: Emergency Medicine

## 2015-02-20 ENCOUNTER — Observation Stay (HOSPITAL_COMMUNITY)
Admission: EM | Admit: 2015-02-20 | Discharge: 2015-02-21 | Disposition: A | Discharge status: Home / Self Care | Payer: Medicare Other | Attending: Family Medicine | Admitting: Family Medicine

## 2015-02-20 ENCOUNTER — Other Ambulatory Visit: Payer: Self-pay

## 2015-02-20 DIAGNOSIS — E875 Hyperkalemia: Secondary | ICD-10-CM | POA: Diagnosis not present

## 2015-02-20 DIAGNOSIS — I5030 Unspecified diastolic (congestive) heart failure: Secondary | ICD-10-CM | POA: Diagnosis not present

## 2015-02-20 DIAGNOSIS — Z720 Tobacco use: Secondary | ICD-10-CM | POA: Diagnosis not present

## 2015-02-20 DIAGNOSIS — F319 Bipolar disorder, unspecified: Secondary | ICD-10-CM

## 2015-02-20 DIAGNOSIS — R739 Hyperglycemia, unspecified: Secondary | ICD-10-CM | POA: Diagnosis not present

## 2015-02-20 DIAGNOSIS — F29 Unspecified psychosis not due to a substance or known physiological condition: Secondary | ICD-10-CM | POA: Insufficient documentation

## 2015-02-20 DIAGNOSIS — Z992 Dependence on renal dialysis: Secondary | ICD-10-CM

## 2015-02-20 DIAGNOSIS — I120 Hypertensive chronic kidney disease with stage 5 chronic kidney disease or end stage renal disease: Secondary | ICD-10-CM | POA: Diagnosis not present

## 2015-02-20 DIAGNOSIS — D649 Anemia, unspecified: Secondary | ICD-10-CM | POA: Diagnosis not present

## 2015-02-20 DIAGNOSIS — F209 Schizophrenia, unspecified: Secondary | ICD-10-CM | POA: Diagnosis not present

## 2015-02-20 DIAGNOSIS — I509 Heart failure, unspecified: Secondary | ICD-10-CM | POA: Diagnosis not present

## 2015-02-20 DIAGNOSIS — D638 Anemia in other chronic diseases classified elsewhere: Secondary | ICD-10-CM

## 2015-02-20 DIAGNOSIS — N186 End stage renal disease: Secondary | ICD-10-CM | POA: Diagnosis present

## 2015-02-20 DIAGNOSIS — M3214 Glomerular disease in systemic lupus erythematosus: Secondary | ICD-10-CM | POA: Diagnosis not present

## 2015-02-20 LAB — I-STAT CHEM 8, ED
BUN: 77 mg/dL — AB (ref 6–20)
CALCIUM ION: 1.08 mmol/L — AB (ref 1.12–1.23)
Chloride: 99 mmol/L — ABNORMAL LOW (ref 101–111)
Creatinine, Ser: 15.8 mg/dL — ABNORMAL HIGH (ref 0.44–1.00)
Glucose, Bld: 113 mg/dL — ABNORMAL HIGH (ref 65–99)
HEMATOCRIT: 27 % — AB (ref 36.0–46.0)
HEMOGLOBIN: 9.2 g/dL — AB (ref 12.0–15.0)
Potassium: 5.5 mmol/L — ABNORMAL HIGH (ref 3.5–5.1)
SODIUM: 134 mmol/L — AB (ref 135–145)
TCO2: 22 mmol/L (ref 0–100)

## 2015-02-20 LAB — BASIC METABOLIC PANEL
Anion gap: 19 — ABNORMAL HIGH (ref 5–15)
BUN: 73 mg/dL — AB (ref 6–20)
CALCIUM: 9.1 mg/dL (ref 8.9–10.3)
CHLORIDE: 95 mmol/L — AB (ref 101–111)
CO2: 21 mmol/L — ABNORMAL LOW (ref 22–32)
CREATININE: 15.89 mg/dL — AB (ref 0.44–1.00)
GFR, EST AFRICAN AMERICAN: 3 mL/min — AB (ref >60)
GFR, EST NON AFRICAN AMERICAN: 3 mL/min — AB (ref >60)
Glucose, Bld: 116 mg/dL — ABNORMAL HIGH (ref 65–99)
Potassium: 5.5 mmol/L — ABNORMAL HIGH (ref 3.5–5.1)
SODIUM: 135 mmol/L (ref 135–145)

## 2015-02-20 LAB — CBC WITH DIFFERENTIAL/PLATELET
BASOS PCT: 0 %
Basophils Absolute: 0 10*3/uL (ref 0.0–0.1)
EOS ABS: 0 10*3/uL (ref 0.0–0.7)
EOS PCT: 0 %
HCT: 25 % — ABNORMAL LOW (ref 36.0–46.0)
HEMOGLOBIN: 7.9 g/dL — AB (ref 12.0–15.0)
LYMPHS ABS: 0.4 10*3/uL — AB (ref 0.7–4.0)
Lymphocytes Relative: 10 %
MCH: 27.2 pg (ref 26.0–34.0)
MCHC: 31.6 g/dL (ref 30.0–36.0)
MCV: 86.2 fL (ref 78.0–100.0)
MONO ABS: 0.1 10*3/uL (ref 0.1–1.0)
MONOS PCT: 3 %
NEUTROS PCT: 87 %
Neutro Abs: 3.4 10*3/uL (ref 1.7–7.7)
PLATELETS: 287 10*3/uL (ref 150–400)
RBC: 2.9 MIL/uL — ABNORMAL LOW (ref 3.87–5.11)
RDW: 16 % — AB (ref 11.5–15.5)
WBC: 3.9 10*3/uL — ABNORMAL LOW (ref 4.0–10.5)

## 2015-02-20 MED ORDER — SODIUM CHLORIDE 0.9 % IV SOLN: 250.0000 mL | INTRAVENOUS | Status: DC | PRN

## 2015-02-20 MED ORDER — MEDROXYPROGESTERONE ACETATE 10 MG PO TABS
10.0000 mg | ORAL_TABLET | Freq: Every day | ORAL | Status: DC
  Administered 2015-02-21: 10 mg via ORAL
  Filled 2015-02-20: qty 1

## 2015-02-20 MED ORDER — TRAMADOL HCL 50 MG PO TABS
50.0000 mg | ORAL_TABLET | Freq: Three times a day (TID) | ORAL | Status: DC | PRN
  Administered 2015-02-21 (×2): 50 mg via ORAL
  Filled 2015-02-20 (×2): qty 1

## 2015-02-20 MED ORDER — SODIUM CHLORIDE 0.9 % IJ SOLN: 3.0000 mL | Freq: Two times a day (BID) | INTRAMUSCULAR | Status: DC

## 2015-02-20 MED ORDER — PREDNISONE 20 MG PO TABS
40.0000 mg | ORAL_TABLET | Freq: Every day | ORAL | Status: DC
  Administered 2015-02-21: 40 mg via ORAL
  Filled 2015-02-20: qty 2

## 2015-02-20 MED ORDER — ACETAMINOPHEN 500 MG PO TABS: 500.0000 mg | ORAL_TABLET | Freq: Four times a day (QID) | ORAL | Status: DC | PRN

## 2015-02-20 MED ORDER — SODIUM CHLORIDE 0.9 % IJ SOLN: 3.0000 mL | INTRAMUSCULAR | Status: DC | PRN

## 2015-02-20 MED ORDER — TRIAMCINOLONE ACETONIDE 0.025 % EX OINT: TOPICAL_OINTMENT | Freq: Two times a day (BID) | CUTANEOUS | Status: DC

## 2015-02-20 MED ORDER — METOPROLOL TARTRATE 50 MG PO TABS
50.0000 mg | ORAL_TABLET | Freq: Two times a day (BID) | ORAL | Status: DC
  Administered 2015-02-21: 50 mg via ORAL
  Filled 2015-02-20 (×2): qty 1

## 2015-02-20 MED ORDER — TRIAMCINOLONE ACETONIDE 0.025 % EX CREA
TOPICAL_CREAM | Freq: Two times a day (BID) | CUTANEOUS | Status: DC
  Filled 2015-02-20 (×2): qty 15

## 2015-02-20 MED ORDER — IBUPROFEN 400 MG PO TABS: 400.0000 mg | ORAL_TABLET | Freq: Four times a day (QID) | ORAL | Status: DC | PRN
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
## 2015-02-20 NOTE — ED Notes (Signed)
Pt. is here for her hemodialysis , no other complaints .
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
## 2015-02-20 NOTE — ED Notes (Signed)
Gave pt [place] sandwich and coffee, per Rubin Payor - RN

## 2015-02-21 DIAGNOSIS — I5030 Unspecified diastolic (congestive) heart failure: Secondary | ICD-10-CM | POA: Diagnosis not present

## 2015-02-21 DIAGNOSIS — E875 Hyperkalemia: Secondary | ICD-10-CM | POA: Diagnosis not present

## 2015-02-21 DIAGNOSIS — N186 End stage renal disease: Secondary | ICD-10-CM

## 2015-02-21 DIAGNOSIS — I509 Heart failure, unspecified: Secondary | ICD-10-CM | POA: Diagnosis not present

## 2015-02-21 DIAGNOSIS — F29 Unspecified psychosis not due to a substance or known physiological condition: Secondary | ICD-10-CM | POA: Diagnosis not present

## 2015-02-21 DIAGNOSIS — Z992 Dependence on renal dialysis: Secondary | ICD-10-CM | POA: Diagnosis not present

## 2015-02-21 DIAGNOSIS — Z720 Tobacco use: Secondary | ICD-10-CM | POA: Diagnosis not present

## 2015-02-21 DIAGNOSIS — I120 Hypertensive chronic kidney disease with stage 5 chronic kidney disease or end stage renal disease: Secondary | ICD-10-CM | POA: Diagnosis not present

## 2015-02-21 DIAGNOSIS — D649 Anemia, unspecified: Secondary | ICD-10-CM | POA: Diagnosis not present

## 2015-02-21 DIAGNOSIS — F209 Schizophrenia, unspecified: Secondary | ICD-10-CM | POA: Diagnosis not present

## 2015-02-21 DIAGNOSIS — F319 Bipolar disorder, unspecified: Secondary | ICD-10-CM | POA: Diagnosis not present

## 2015-02-21 DIAGNOSIS — M3214 Glomerular disease in systemic lupus erythematosus: Secondary | ICD-10-CM | POA: Diagnosis not present

## 2015-02-21 LAB — RENAL FUNCTION PANEL
Albumin: 2.8 g/dL — ABNORMAL LOW (ref 3.5–5.0)
Anion gap: 17 — ABNORMAL HIGH (ref 5–15)
BUN: 86 mg/dL — AB (ref 6–20)
CALCIUM: 8.7 mg/dL — AB (ref 8.9–10.3)
CO2: 18 mmol/L — AB (ref 22–32)
CREATININE: 16.05 mg/dL — AB (ref 0.44–1.00)
Chloride: 100 mmol/L — ABNORMAL LOW (ref 101–111)
GFR, EST AFRICAN AMERICAN: 3 mL/min — AB (ref >60)
GFR, EST NON AFRICAN AMERICAN: 3 mL/min — AB (ref >60)
Glucose, Bld: 99 mg/dL (ref 65–99)
Phosphorus: 12.1 mg/dL — ABNORMAL HIGH (ref 2.5–4.6)
Potassium: 4.9 mmol/L (ref 3.5–5.1)
SODIUM: 135 mmol/L (ref 135–145)

## 2015-02-21 LAB — CBC
HCT: 20.2 % — ABNORMAL LOW (ref 36.0–46.0)
Hemoglobin: 6.4 g/dL — CL (ref 12.0–15.0)
MCH: 27.4 pg (ref 26.0–34.0)
MCHC: 31.7 g/dL (ref 30.0–36.0)
MCV: 86.3 fL (ref 78.0–100.0)
PLATELETS: 200 10*3/uL (ref 150–400)
RBC: 2.34 MIL/uL — AB (ref 3.87–5.11)
RDW: 15.9 % — AB (ref 11.5–15.5)
WBC: 4.2 10*3/uL (ref 4.0–10.5)

## 2015-02-21 LAB — PREPARE RBC (CROSSMATCH)

## 2015-02-21 LAB — HEMOGLOBIN AND HEMATOCRIT, BLOOD
HEMATOCRIT: 22.7 % — AB (ref 36.0–46.0)
HEMOGLOBIN: 7.7 g/dL — AB (ref 12.0–15.0)

## 2015-02-21 MED ORDER — HEPARIN SODIUM (PORCINE) 1000 UNIT/ML DIALYSIS: 20.0000 [IU]/kg | INTRAMUSCULAR | Status: DC | PRN

## 2015-02-21 MED ORDER — HEPARIN SODIUM (PORCINE) 1000 UNIT/ML DIALYSIS: 1000.0000 [IU] | INTRAMUSCULAR | Status: DC | PRN

## 2015-02-21 MED ORDER — DARBEPOETIN ALFA 200 MCG/0.4ML IJ SOSY
PREFILLED_SYRINGE | INTRAMUSCULAR | Status: AC
  Filled 2015-02-21: qty 0.4

## 2015-02-21 MED ORDER — DARBEPOETIN ALFA 200 MCG/0.4ML IJ SOSY
200.0000 ug | PREFILLED_SYRINGE | Freq: Once | INTRAMUSCULAR | Status: AC
  Administered 2015-02-21: 200 ug via INTRAVENOUS
  Filled 2015-02-21: qty 0.4

## 2015-02-21 MED ORDER — SODIUM CHLORIDE 0.9 % IV SOLN: Freq: Once | INTRAVENOUS | Status: DC

## 2015-02-21 MED ORDER — SODIUM CHLORIDE 0.9 % IV SOLN
125.0000 mg | Freq: Once | INTRAVENOUS | Status: AC
  Administered 2015-02-21: 125 mg via INTRAVENOUS
  Filled 2015-02-21 (×2): qty 10

## 2015-02-21 MED ORDER — SODIUM CHLORIDE 0.9 % IV SOLN: 100.0000 mL | INTRAVENOUS | Status: DC | PRN

## 2015-02-21 MED ORDER — LIDOCAINE HCL (PF) 1 % IJ SOLN: 5.0000 mL | INTRAMUSCULAR | Status: DC | PRN

## 2015-02-21 MED ORDER — PENTAFLUOROPROP-TETRAFLUOROETH EX AERO: 1.0000 "application " | INHALATION_SPRAY | CUTANEOUS | Status: DC | PRN

## 2015-02-21 MED ORDER — LIDOCAINE-PRILOCAINE 2.5-2.5 % EX CREA
1.0000 "application " | TOPICAL_CREAM | CUTANEOUS | Status: DC | PRN
  Filled 2015-02-21: qty 5

## 2015-02-21 MED ORDER — ALTEPLASE 2 MG IJ SOLR
2.0000 mg | Freq: Once | INTRAMUSCULAR | Status: DC | PRN
  Filled 2015-02-21: qty 2

## 2015-02-21 NOTE — Discharge Summary (Signed)
[facility] Discharge Summary  Patient name: Sara [name] Medical record number: [id number] Date of birth: 02/08/1981 Age: 34 y.o. 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 HEENT: /AT, EOMI, no scleral icterus, MMM Neck: Supple Cardiovascular: RRR, no murmurs, 2+ DP pulses bilaterally Respiratory: CTAB, somewhat decreased air movement in all lung fields, no wheezes Abdomen: +BS, soft, non-tender, non-distended MSK: No edema, moves all 4 extremities spontaneously Skin: Multiple excoriations present on back and legs, otherwise no rashes Neuro: Awake, alert, oriented; CN 2-12 grossly intact Psych: Appropriate mood and behavior  Brief Hospital Course:  Sara [name] is a 34 year old female who presented to the ED for dialysis. Dialysis was full, so she was admitted for overnight observation until she could receive dialysis in the morning. Labs on admission were significant for K 5.5, Hgb 7.9. In dialysis, her hemoglobin was found to be 6.4 and she was transfused 2 units of blood. Her post-transfusion H//H was 7.7/22.7. Social work was consulted to help set her up with an outpatient dialysis center so that she does not continue to be admitted for dialysis. Social work was unable to see her before she was discharged. Patient states that she has an advocate that is currently working on finding her a dialysis center. We encouraged her to get in touch with this person.   All of her  other medical conditions were stable and no changes were made to her medications.  Issues for Follow Up:  1. We hope that Ms. Versteeg can be set up with an outpatient HD center so that she does not have to continue to be admitted for dialysis.  Significant Procedures: HD on 11/16  Significant Labs and Imaging:   Recent Labs Lab 02/20/15 2030 02/20/15 2042 02/21/15 0945 02/21/15 1345  WBC 3.9*  --  4.2  --   HGB 7.9* 9.2* 6.4* 7.7*  HCT 25.0* 27.0* 20.2* 22.7*  PLT 287  --  200  --     Recent Labs Lab 02/20/15 2030 02/20/15 2042 02/21/15 0945  NA 135 134* 135  K 5.5* 5.5* 4.9  CL 95* 99* 100*  CO2 21*  --  18*  GLUCOSE 116* 113* 99  BUN 73* 77* 86*  CREATININE 15.89* 15.80* 16.05*  CALCIUM 9.1  --  8.7*  PHOS  --   --  12.1*  ALBUMIN  --   --  2.8*    Results/Tests Pending at Time of Discharge: None  Discharge Medications:    Medication List    TAKE these medications        acetaminophen 500 MG tablet  Commonly known as:  TYLENOL  Take 500 mg by mouth every 6 (six) hours as needed for mild pain.     ibuprofen 400 MG tablet  Commonly known as:  ADVIL,MOTRIN  Take 400 mg by mouth every 6 (six) hours as needed for mild pain.     medroxyPROGESTERone 10 MG tablet  Commonly known as:  PROVERA  Take 1 tablet (10 mg total) by mouth daily.     metoprolol 50 MG tablet  Commonly known as:  LOPRESSOR  Take 1 tablet (50 mg total) by mouth 2 (two) times daily.     predniSONE 20 MG tablet  Commonly known as:  DELTASONE  Take 2 tablets (40 mg total) by mouth daily with breakfast.     traMADol 50 MG tablet  Commonly known as:  ULTRAM  Take 1 tablet (50 mg total) by mouth every 8 (eight) hours as needed.     triamcinolone 0.025 % ointment  Commonly known as:  KENALOG  APPLY 1 APPLICATION TOPICALLY 2 (TWO) TIMES DAILY.        Discharge Instructions: Please refer to Patient Instructions section of EMR for full details.  Patient was counseled important signs and  symptoms that should prompt return to medical care, changes in medications, dietary instructions, activity restrictions, and follow up appointments.   Follow-Up Appointments:   Sela Hua, MD 02/21/2015, 7:05 PM PGY-1, [place]

## 2015-02-21 NOTE — Progress Notes (Signed)
Family Medicine Teaching Service Daily Progress Note Intern Pager: [phone]  Patient name: Sara [name] Medical record number: [id number] Date of birth: May 23, 1980 Age: 34 y.o. Gender: female  Primary Care Provider: Smiley Houseman, MD Consultants: Nephrology Code Status: Full  Pt Overview and Major Events to Date:  11/15: Admitted for overnight observation to receive dialysis this morning, dialysis was full last night.  Assessment and Plan: Sara [name] is a 34 y.o. female presenting to the ED for dialysis. PMH is significant for ESRD, Lupus, HFrEF, Schizophrenia, Bipolar disorder.  1. 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 Last dialyzed 11/11. On admission, K 5.5, Cr 15.8 (baseline appears to be around 10-13). EKG showed sinus rhythm, prolonged QTc 506, LVH, no peaked T waves. Euvolemic on exam. Dialysis unit is full tonight, so we are admitting her for observation so she can get dialysis in the am. - Admit to FPTS for observation, attending Dr. Ardelia Mems. - Will consult Nephrology for dialysis in the am. - Likely discharge home after dialysis. - Will get social work consult to help with finding her a dialysis center.  2. Anemia of chronic disease: Normocytic. Likely secondary to ESRD and lupus. Hgb 9.3 on admission. Baseline appears to be around 9-10. Ferritin was last checked in 12/2014 and was 904. - Will continue to monitor.  3. HFrEF: Last ECHO 08/2014: EF 40-45%, inferolateral hypokinesis, moderate LA dilation, mild tricuspid regurgitation, small pericardial effusion but no tamponade. - Will continue home meds: Metoprolol 50mg  qd.  4. Right hip pain: Had a hip x-ray 01/2015, which showed no acute abnormalities. She was seen yesterday by her PCP who thought the joint pain may be secondary to her lupus and prescribed Tramadol 50mg  q8hrs prn and Prednisone 40mg  x 5 days. She states  this has significantly helped her hip pain.  - Will continue Tramadol and Prednisone. - Monitor  5. Schizophrenia/Bipolar disorder: Does not appear to be taking any medications currently. May have been on Geodon in the past, but this is now on her allergy list. - Continue to monitor   FEN/GI: Renal diet, SLIV Prophylaxis: SCDs  Disposition: Home today after dialysis.  Subjective:  Pt is doing well this morning. She states that she has to come to the ED 5 days a week in order to receive dialysis because they are usually full. She is currently receiving dialysis twice a week. She sometimes has to wait in the ED for 12 hours. She would like to speak with a Education officer, museum to help her get set up with a dialysis center. She has no other concerns.  Objective: Temp:  [97.5 F (36.4 C)-98.2 F (36.8 C)] 97.5 F (36.4 C) (11/16 0500) Pulse Rate:  [89-106] 89 (11/16 0500) Resp:  [16-18] 18 (11/16 0500) BP: (107-154)/(91-114) 107/91 mmHg (11/16 0500) SpO2:  [100 %] 100 % (11/16 0500) Weight:  [140 lb 12.8 oz (63.866 kg)] 140 lb 12.8 oz (63.866 kg) (11/15 2253) Physical Exam: General: Well-appearing female, laying in bed, pleasant and conversational. HEENT: [place]/AT, EOMI, no scleral icterus, MMM Neck: Supple Cardiovascular: RRR, no murmurs, 2+ DP pulses bilaterally Respiratory: CTAB, somewhat decreased air movement in all lung fields, no wheezes Abdomen: +BS, soft, non-tender, non-distended MSK: No edema, moves all 4 extremities spontaneously Skin: Multiple excoriations present on back and legs, otherwise no rashes Neuro: Awake, alert, oriented; CN 2-12 grossly intact Psych: Appropriate mood and behavior  Laboratory:  Recent Labs Lab 02/20/15 2030 02/20/15 2042  WBC 3.9*  --   HGB 7.9* 9.2*  HCT 25.0* 27.0*  PLT 287  --     Recent Labs Lab 02/20/15 2030 02/20/15 2042  NA 135 134*  K 5.5* 5.5*  CL 95* 99*  CO2 21*  --   BUN 73* 77*  CREATININE 15.89* 15.80*  CALCIUM 9.1  --    GLUCOSE 116* 113*    Imaging/Diagnostic Tests: None  Sela Hua, MD 02/21/2015, 7:08 AM PGY-1, [place] Intern pager: [phone], text pages welcome

## 2015-02-21 NOTE — Procedures (Signed)
Patient returns, still has no outpatient HD unit.  Hb 6.5 today, plan transfusion of 2units prbc's.  No active bleeding per patient. Last Darbe was 3 weeks ago, 100 ug.  Plan darbe 200 ug today with 125 mg of IV Fe. Max UF 5-6kg.    I was present at this dialysis session, have reviewed the session itself and made  appropriate changes Kelly Splinter MD [place] pager [phone]    cell [phone] 02/21/2015, 11:27 AM

## 2015-02-21 NOTE — Discharge Instructions (Signed)
You were hospitalized because you needed dialysis, but it was full last night. Please continue to work with your advocate to find a dialysis center where you can receive regular dialysis.

## 2015-02-21 NOTE — Progress Notes (Signed)
Dc INSTRUCTIONS GIVEN TO PT AT THIS TIME.  TELEMETRY REMOVED.  NO S/S OF ANY ACUTE DISTRESS.  NO C/O ANYTHING.  PT VERBALIZED UNDERSTANDING OF ALL INSTRUCTIONS.

## 2015-02-21 NOTE — Progress Notes (Signed)
Patient arrived on unit via stretcher. Patient alert and oriented x4. Patient oriented to room, staff and unit. Patient placed on telemetry monitor, CCMD notified. Patient does not have an IV access and refused placement, MD notified. Skin assessment completed, general scabs noted. Patient rated pain 7/10, medication administered. Orders have been reviewed and implemented. Will continue to monitor the patient. Call light has been placed within reach.  Nena Polio BSN, RN  Phone Number: [phone]

## 2015-02-23 ENCOUNTER — Non-Acute Institutional Stay (HOSPITAL_COMMUNITY)
Admission: EM | Admit: 2015-02-23 | Discharge: 2015-02-23 | Disposition: A | Discharge status: Home / Self Care | Payer: Medicare Other | Attending: Emergency Medicine | Admitting: Emergency Medicine

## 2015-02-23 ENCOUNTER — Encounter (HOSPITAL_COMMUNITY): Payer: Self-pay | Admitting: Emergency Medicine

## 2015-02-23 DIAGNOSIS — N186 End stage renal disease: Secondary | ICD-10-CM

## 2015-02-23 DIAGNOSIS — M3214 Glomerular disease in systemic lupus erythematosus: Secondary | ICD-10-CM | POA: Diagnosis not present

## 2015-02-23 DIAGNOSIS — F209 Schizophrenia, unspecified: Secondary | ICD-10-CM | POA: Diagnosis not present

## 2015-02-23 DIAGNOSIS — I509 Heart failure, unspecified: Secondary | ICD-10-CM | POA: Insufficient documentation

## 2015-02-23 DIAGNOSIS — F319 Bipolar disorder, unspecified: Secondary | ICD-10-CM | POA: Insufficient documentation

## 2015-02-23 DIAGNOSIS — Z7952 Long term (current) use of systemic steroids: Secondary | ICD-10-CM | POA: Diagnosis not present

## 2015-02-23 DIAGNOSIS — Z992 Dependence on renal dialysis: Secondary | ICD-10-CM | POA: Diagnosis not present

## 2015-02-23 DIAGNOSIS — F17210 Nicotine dependence, cigarettes, uncomplicated: Secondary | ICD-10-CM | POA: Insufficient documentation

## 2015-02-23 DIAGNOSIS — Z791 Long term (current) use of non-steroidal anti-inflammatories (NSAID): Secondary | ICD-10-CM | POA: Insufficient documentation

## 2015-02-23 DIAGNOSIS — I132 Hypertensive heart and chronic kidney disease with heart failure and with stage 5 chronic kidney disease, or end stage renal disease: Secondary | ICD-10-CM | POA: Diagnosis not present

## 2015-02-23 DIAGNOSIS — D649 Anemia, unspecified: Secondary | ICD-10-CM | POA: Diagnosis not present

## 2015-02-23 DIAGNOSIS — I120 Hypertensive chronic kidney disease with stage 5 chronic kidney disease or end stage renal disease: Secondary | ICD-10-CM | POA: Diagnosis not present

## 2015-02-23 LAB — CBC
HCT: 23.5 % — ABNORMAL LOW (ref 36.0–46.0)
Hemoglobin: 7.4 g/dL — ABNORMAL LOW (ref 12.0–15.0)
MCH: 27.1 pg (ref 26.0–34.0)
MCHC: 31.5 g/dL (ref 30.0–36.0)
MCV: 86.1 fL (ref 78.0–100.0)
PLATELETS: 244 10*3/uL (ref 150–400)
RBC: 2.73 MIL/uL — ABNORMAL LOW (ref 3.87–5.11)
RDW: 15.8 % — AB (ref 11.5–15.5)
WBC: 6.3 10*3/uL (ref 4.0–10.5)

## 2015-02-23 LAB — RENAL FUNCTION PANEL
Albumin: 2.8 g/dL — ABNORMAL LOW (ref 3.5–5.0)
Anion gap: 17 — ABNORMAL HIGH (ref 5–15)
BUN: 73 mg/dL — AB (ref 6–20)
CHLORIDE: 97 mmol/L — AB (ref 101–111)
CO2: 22 mmol/L (ref 22–32)
CREATININE: 11.67 mg/dL — AB (ref 0.44–1.00)
Calcium: 9.3 mg/dL (ref 8.9–10.3)
GFR calc Af Amer: 4 mL/min — ABNORMAL LOW (ref >60)
GFR, EST NON AFRICAN AMERICAN: 4 mL/min — AB (ref >60)
Glucose, Bld: 96 mg/dL (ref 65–99)
Phosphorus: 9.8 mg/dL — ABNORMAL HIGH (ref 2.5–4.6)
Potassium: 4 mmol/L (ref 3.5–5.1)
Sodium: 136 mmol/L (ref 135–145)

## 2015-02-23 LAB — TYPE AND SCREEN
ABO/RH(D): O POS
Antibody Screen: POSITIVE
DAT, IGG: POSITIVE
Unit division: 0

## 2015-02-23 MED ORDER — LIDOCAINE HCL (PF) 1 % IJ SOLN: 5.0000 mL | INTRAMUSCULAR | Status: DC | PRN

## 2015-02-23 MED ORDER — HEPARIN SODIUM (PORCINE) 1000 UNIT/ML DIALYSIS
1000.0000 [IU] | INTRAMUSCULAR | Status: DC | PRN
  Filled 2015-02-23: qty 1

## 2015-02-23 MED ORDER — SODIUM CHLORIDE 0.9 % IV SOLN: 100.0000 mL | INTRAVENOUS | Status: DC | PRN

## 2015-02-23 MED ORDER — LIDOCAINE-PRILOCAINE 2.5-2.5 % EX CREA
1.0000 "application " | TOPICAL_CREAM | CUTANEOUS | Status: DC | PRN
  Filled 2015-02-23: qty 5

## 2015-02-23 MED ORDER — PENTAFLUOROPROP-TETRAFLUOROETH EX AERO
1.0000 "application " | INHALATION_SPRAY | CUTANEOUS | Status: DC | PRN
  Filled 2015-02-23: qty 30

## 2015-02-23 MED ORDER — ALTEPLASE 2 MG IJ SOLR
2.0000 mg | Freq: Once | INTRAMUSCULAR | Status: DC | PRN
  Filled 2015-02-23: qty 2

## 2015-02-23 NOTE — ED Notes (Signed)
Patient ate lunch.

## 2015-02-23 NOTE — Progress Notes (Signed)
Treatment complete goal met tolerated well removed 4 liters vital signs stable, discharged home via self ambulation
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
## 2015-02-23 NOTE — ED Notes (Signed)
Meal tray ordered 

## 2015-02-23 NOTE — ED Notes (Signed)
Patient comes in for Dialysis states last treatment Tuesday. Patient also complains of Back pain states has slept on couch. Patient states she has taken a pain pill/

## 2015-02-26 ENCOUNTER — Other Ambulatory Visit: Payer: Self-pay | Admitting: *Deleted

## 2015-02-26 ENCOUNTER — Non-Acute Institutional Stay (HOSPITAL_COMMUNITY)
Admission: EM | Admit: 2015-02-26 | Discharge: 2015-02-26 | Disposition: A | Discharge status: Home / Self Care | Payer: Medicare Other | Attending: Nurse Practitioner | Admitting: Nurse Practitioner

## 2015-02-26 ENCOUNTER — Encounter (HOSPITAL_COMMUNITY): Payer: Self-pay | Admitting: *Deleted

## 2015-02-26 DIAGNOSIS — N186 End stage renal disease: Secondary | ICD-10-CM | POA: Diagnosis present

## 2015-02-26 LAB — RENAL FUNCTION PANEL
Albumin: 3.2 g/dL — ABNORMAL LOW (ref 3.5–5.0)
Anion gap: 15 (ref 5–15)
BUN: 94 mg/dL — ABNORMAL HIGH (ref 6–20)
CO2: 21 mmol/L — ABNORMAL LOW (ref 22–32)
Calcium: 9.4 mg/dL (ref 8.9–10.3)
Chloride: 99 mmol/L — ABNORMAL LOW (ref 101–111)
Creatinine, Ser: 12.64 mg/dL — ABNORMAL HIGH (ref 0.44–1.00)
GFR calc Af Amer: 4 mL/min — ABNORMAL LOW (ref >60)
GFR calc non Af Amer: 3 mL/min — ABNORMAL LOW (ref >60)
Glucose, Bld: 100 mg/dL — ABNORMAL HIGH (ref 65–99)
Phosphorus: 9.9 mg/dL — ABNORMAL HIGH (ref 2.5–4.6)
Potassium: 5.2 mmol/L — ABNORMAL HIGH (ref 3.5–5.1)
Sodium: 135 mmol/L (ref 135–145)

## 2015-02-26 LAB — CBC
HCT: 26.3 % — ABNORMAL LOW (ref 36.0–46.0)
Hemoglobin: 8.3 g/dL — ABNORMAL LOW (ref 12.0–15.0)
MCH: 27.6 pg (ref 26.0–34.0)
MCHC: 31.6 g/dL (ref 30.0–36.0)
MCV: 87.4 fL (ref 78.0–100.0)
Platelets: 339 10*3/uL (ref 150–400)
RBC: 3.01 MIL/uL — ABNORMAL LOW (ref 3.87–5.11)
RDW: 16.5 % — ABNORMAL HIGH (ref 11.5–15.5)
WBC: 7.4 10*3/uL (ref 4.0–10.5)

## 2015-02-26 MED ORDER — SODIUM CHLORIDE 0.9 % IV SOLN: 100.0000 mL | INTRAVENOUS | Status: DC | PRN

## 2015-02-26 MED ORDER — HEPARIN SODIUM (PORCINE) 1000 UNIT/ML DIALYSIS
1000.0000 [IU] | INTRAMUSCULAR | Status: DC | PRN
  Filled 2015-02-26: qty 1

## 2015-02-26 MED ORDER — PENTAFLUOROPROP-TETRAFLUOROETH EX AERO
1.0000 "application " | INHALATION_SPRAY | CUTANEOUS | Status: DC | PRN
  Filled 2015-02-26: qty 30

## 2015-02-26 MED ORDER — ALTEPLASE 2 MG IJ SOLR
2.0000 mg | Freq: Once | INTRAMUSCULAR | Status: DC | PRN
  Filled 2015-02-26: qty 2

## 2015-02-26 MED ORDER — LIDOCAINE HCL (PF) 1 % IJ SOLN: 5.0000 mL | INTRAMUSCULAR | Status: DC | PRN

## 2015-02-26 MED ORDER — LIDOCAINE-PRILOCAINE 2.5-2.5 % EX CREA
1.0000 "application " | TOPICAL_CREAM | CUTANEOUS | Status: DC | PRN
  Filled 2015-02-26: qty 5

## 2015-02-26 NOTE — ED Notes (Signed)
Pt sent up to dialysis.

## 2015-02-26 NOTE — ED Notes (Signed)
Pt last had dialysis on Friday. Here for dialysis. No other complaints.

## 2015-02-26 NOTE — ED Notes (Signed)
Discharged home from dialysis 

## 2015-02-27 ENCOUNTER — Other Ambulatory Visit: Payer: Self-pay | Admitting: Internal Medicine

## 2015-02-27 DIAGNOSIS — M329 Systemic lupus erythematosus, unspecified: Secondary | ICD-10-CM

## 2015-02-27 DIAGNOSIS — M2550 Pain in unspecified joint: Secondary | ICD-10-CM

## 2015-02-27 MED ORDER — PREDNISONE 10 MG PO TABS: ORAL_TABLET | ORAL | Status: DC

## 2015-02-27 MED ORDER — PREDNISONE 10 MG PO TABS
ORAL_TABLET | ORAL | Status: DC
Start: 2015-02-27 — End: 2015-02-27

## 2015-02-27 NOTE — Telephone Encounter (Signed)
Rx for the prednisone resent to [facility] electronically. Rx stated print.  Derl Barrow, RN

## 2015-02-27 NOTE — Telephone Encounter (Signed)
Patient called for refill request for Prednisone. She was seen in clinic on 11/14 for joint pain. Her symptoms were thought to be due to her history of lupus. She had not seen a rheumatologist for her lupus in the past.  A referral to rheumatology was made at that time.  She was prescribed steroid burst for 5 days in hopes that she would be seen sooner. Spoke with Ms. Sara [name], who mentioned that patient does not have an appointment with rheumatology yet, and it may take a few weeks for her to be seen.   Per patient call note, steroid seems to be helping. In hopes patient will be seen by rheumatology within the next few weeks, will prescribe a steroid taper with two more days of prednisone 40mg  daily, then 3 days 30mg  daily, then 3 days 20mg  daily, then 3 days of 10mg  daily.

## 2015-02-27 NOTE — Telephone Encounter (Signed)
Refill request for Prednisone. Pt states steroid in working.

## 2015-02-27 NOTE — Addendum Note (Signed)
Addended by: Derl Barrow on: 02/27/2015 04:29 PM   Modules accepted: Orders

## 2015-02-27 NOTE — Telephone Encounter (Signed)
Dr. Dallas Schimke was the rx printed or sent in because it says printed. Seleen Walter, CMA.

## 2015-02-28 ENCOUNTER — Encounter (HOSPITAL_COMMUNITY): Payer: Self-pay | Admitting: Emergency Medicine

## 2015-02-28 ENCOUNTER — Non-Acute Institutional Stay (HOSPITAL_COMMUNITY)
Admission: EM | Admit: 2015-02-28 | Discharge: 2015-03-01 | Disposition: A | Discharge status: Home / Self Care | Payer: Medicare Other | Attending: Emergency Medicine | Admitting: Emergency Medicine

## 2015-02-28 DIAGNOSIS — F17210 Nicotine dependence, cigarettes, uncomplicated: Secondary | ICD-10-CM | POA: Diagnosis not present

## 2015-02-28 DIAGNOSIS — Z992 Dependence on renal dialysis: Secondary | ICD-10-CM | POA: Insufficient documentation

## 2015-02-28 DIAGNOSIS — F209 Schizophrenia, unspecified: Secondary | ICD-10-CM | POA: Diagnosis not present

## 2015-02-28 DIAGNOSIS — I120 Hypertensive chronic kidney disease with stage 5 chronic kidney disease or end stage renal disease: Secondary | ICD-10-CM | POA: Diagnosis not present

## 2015-02-28 DIAGNOSIS — F319 Bipolar disorder, unspecified: Secondary | ICD-10-CM | POA: Diagnosis not present

## 2015-02-28 DIAGNOSIS — N186 End stage renal disease: Secondary | ICD-10-CM | POA: Insufficient documentation

## 2015-02-28 DIAGNOSIS — Z791 Long term (current) use of non-steroidal anti-inflammatories (NSAID): Secondary | ICD-10-CM | POA: Diagnosis not present

## 2015-02-28 DIAGNOSIS — I132 Hypertensive heart and chronic kidney disease with heart failure and with stage 5 chronic kidney disease, or end stage renal disease: Secondary | ICD-10-CM | POA: Diagnosis not present

## 2015-02-28 DIAGNOSIS — M3214 Glomerular disease in systemic lupus erythematosus: Secondary | ICD-10-CM | POA: Insufficient documentation

## 2015-02-28 DIAGNOSIS — I509 Heart failure, unspecified: Secondary | ICD-10-CM | POA: Diagnosis not present

## 2015-02-28 LAB — RENAL FUNCTION PANEL
ALBUMIN: 3.1 g/dL — AB (ref 3.5–5.0)
ANION GAP: 16 — AB (ref 5–15)
BUN: 91 mg/dL — AB (ref 6–20)
CO2: 21 mmol/L — AB (ref 22–32)
Calcium: 8.8 mg/dL — ABNORMAL LOW (ref 8.9–10.3)
Chloride: 98 mmol/L — ABNORMAL LOW (ref 101–111)
Creatinine, Ser: 12.26 mg/dL — ABNORMAL HIGH (ref 0.44–1.00)
GFR calc Af Amer: 4 mL/min — ABNORMAL LOW (ref >60)
GFR calc non Af Amer: 4 mL/min — ABNORMAL LOW (ref >60)
GLUCOSE: 132 mg/dL — AB (ref 65–99)
PHOSPHORUS: 10 mg/dL — AB (ref 2.5–4.6)
POTASSIUM: 5.2 mmol/L — AB (ref 3.5–5.1)
Sodium: 135 mmol/L (ref 135–145)

## 2015-02-28 LAB — CBC
HEMATOCRIT: 24.8 % — AB (ref 36.0–46.0)
HEMOGLOBIN: 8 g/dL — AB (ref 12.0–15.0)
MCH: 28.4 pg (ref 26.0–34.0)
MCHC: 32.3 g/dL (ref 30.0–36.0)
MCV: 87.9 fL (ref 78.0–100.0)
Platelets: 291 10*3/uL (ref 150–400)
RBC: 2.82 MIL/uL — ABNORMAL LOW (ref 3.87–5.11)
RDW: 17.2 % — AB (ref 11.5–15.5)
WBC: 7.8 10*3/uL (ref 4.0–10.5)

## 2015-02-28 LAB — I-STAT CHEM 8, ED
BUN: 92 mg/dL — AB (ref 6–20)
CALCIUM ION: 1.05 mmol/L — AB (ref 1.12–1.23)
CHLORIDE: 101 mmol/L (ref 101–111)
Creatinine, Ser: 12.3 mg/dL — ABNORMAL HIGH (ref 0.44–1.00)
GLUCOSE: 105 mg/dL — AB (ref 65–99)
HCT: 27 % — ABNORMAL LOW (ref 36.0–46.0)
Hemoglobin: 9.2 g/dL — ABNORMAL LOW (ref 12.0–15.0)
Potassium: 4.8 mmol/L (ref 3.5–5.1)
SODIUM: 137 mmol/L (ref 135–145)
TCO2: 22 mmol/L (ref 0–100)

## 2015-02-28 MED ORDER — LIDOCAINE-PRILOCAINE 2.5-2.5 % EX CREA: 1.0000 "application " | TOPICAL_CREAM | CUTANEOUS | Status: DC | PRN

## 2015-02-28 MED ORDER — ALTEPLASE 2 MG IJ SOLR: 2.0000 mg | Freq: Once | INTRAMUSCULAR | Status: DC | PRN

## 2015-02-28 MED ORDER — HEPARIN SODIUM (PORCINE) 1000 UNIT/ML DIALYSIS: 1000.0000 [IU] | INTRAMUSCULAR | Status: DC | PRN

## 2015-02-28 MED ORDER — PENTAFLUOROPROP-TETRAFLUOROETH EX AERO: 1.0000 "application " | INHALATION_SPRAY | CUTANEOUS | Status: DC | PRN

## 2015-02-28 MED ORDER — HEPARIN SODIUM (PORCINE) 1000 UNIT/ML DIALYSIS: 5000.0000 [IU] | Freq: Once | INTRAMUSCULAR | Status: DC

## 2015-02-28 MED ORDER — SODIUM CHLORIDE 0.9 % IV SOLN: 100.0000 mL | INTRAVENOUS | Status: DC | PRN

## 2015-02-28 MED ORDER — LIDOCAINE HCL (PF) 1 % IJ SOLN: 5.0000 mL | INTRAMUSCULAR | Status: DC | PRN

## 2015-02-28 NOTE — Discharge Instructions (Signed)
Chronic Kidney Disease Chronic kidney disease happens when the kidneys are damaged over a long period. The kidneys are two organs that do many important jobs in the body. These jobs include:  Removing wastes and extra fluids from the blood.  Making hormones that help to keep the body healthy.  Making sure that the body has the right amount of fluids and chemicals. Chronic kidney disease may be caused by many things. The kidney damage occurs slowly. If too much damage occurs, the kidneys may stop working the way that they should. This is dangerous. Treatment can help to slow down the damage and keep it from getting worse. HOME CARE  Follow your diet as told by your doctor. You may need to limit the amount of salt (sodium) and protein that you eat each day.  Take medicines only as told by your doctor. Do not take any new medicines unless your doctor approves it.  Quit smoking if you smoke. Talk to your doctor about programs that may help you quit smoking.  Have your blood pressure checked regularly and keep track of the results.  Start or keep doing an exercise plan.  Get shots (immunizations) as told by your doctor.  Take vitamins and minerals as told by your doctor.  Keep all follow-up visits as told by your doctor. This is important. GET HELP RIGHT AWAY IF:   Your symptoms get worse.  You have new symptoms.  You have symptoms of end-stage kidney disease. These include:  Headaches.  Skin that is darker or lighter than normal.  Numbness in the hands or feet.  Easy bruising.  Frequent hiccups.  Stopping of menstrual periods in women.  You have a fever.  You are making very little pee (urine).  You have pain or bleeding when you pee.   This information is not intended to replace advice given to you by your health care provider. Make sure you discuss any questions you have with your health care provider.   Document Released: 06/18/2009 Document Revised: 12/13/2014  Document Reviewed: 11/21/2011 Elsevier Interactive Patient Education 2016 Elsevier Inc.  

## 2015-02-28 NOTE — ED Notes (Signed)
Pt last had dialysis Monday. Pt here for dialysis. Pt is 10 lb heavier than last dialysis. Pt started on prednisone 1 week ago for lupus.
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
## 2015-03-04 ENCOUNTER — Emergency Department (HOSPITAL_COMMUNITY)
Admission: EM | Admit: 2015-03-04 | Discharge: 2015-03-04 | Disposition: A | Discharge status: Home / Self Care | Payer: Medicare Other | Attending: Emergency Medicine | Admitting: Emergency Medicine

## 2015-03-04 ENCOUNTER — Encounter (HOSPITAL_COMMUNITY): Payer: Self-pay | Admitting: *Deleted

## 2015-03-04 ENCOUNTER — Emergency Department (HOSPITAL_COMMUNITY): Payer: Medicare Other

## 2015-03-04 DIAGNOSIS — N186 End stage renal disease: Secondary | ICD-10-CM | POA: Diagnosis not present

## 2015-03-04 DIAGNOSIS — Z4931 Encounter for adequacy testing for hemodialysis: Secondary | ICD-10-CM | POA: Diagnosis not present

## 2015-03-04 DIAGNOSIS — Z992 Dependence on renal dialysis: Secondary | ICD-10-CM | POA: Diagnosis not present

## 2015-03-04 DIAGNOSIS — Z8739 Personal history of other diseases of the musculoskeletal system and connective tissue: Secondary | ICD-10-CM | POA: Insufficient documentation

## 2015-03-04 DIAGNOSIS — I120 Hypertensive chronic kidney disease with stage 5 chronic kidney disease or end stage renal disease: Secondary | ICD-10-CM | POA: Insufficient documentation

## 2015-03-04 DIAGNOSIS — Z8659 Personal history of other mental and behavioral disorders: Secondary | ICD-10-CM | POA: Insufficient documentation

## 2015-03-04 DIAGNOSIS — R05 Cough: Secondary | ICD-10-CM | POA: Insufficient documentation

## 2015-03-04 DIAGNOSIS — Z7952 Long term (current) use of systemic steroids: Secondary | ICD-10-CM | POA: Diagnosis not present

## 2015-03-04 DIAGNOSIS — Z9889 Other specified postprocedural states: Secondary | ICD-10-CM | POA: Diagnosis not present

## 2015-03-04 DIAGNOSIS — Z79899 Other long term (current) drug therapy: Secondary | ICD-10-CM | POA: Insufficient documentation

## 2015-03-04 DIAGNOSIS — D638 Anemia in other chronic diseases classified elsewhere: Secondary | ICD-10-CM | POA: Diagnosis not present

## 2015-03-04 DIAGNOSIS — I509 Heart failure, unspecified: Secondary | ICD-10-CM | POA: Insufficient documentation

## 2015-03-04 DIAGNOSIS — F17210 Nicotine dependence, cigarettes, uncomplicated: Secondary | ICD-10-CM | POA: Diagnosis not present

## 2015-03-04 LAB — RENAL FUNCTION PANEL
ALBUMIN: 3 g/dL — AB (ref 3.5–5.0)
Anion gap: 13 (ref 5–15)
BUN: 72 mg/dL — AB (ref 6–20)
CO2: 25 mmol/L (ref 22–32)
CREATININE: 9.38 mg/dL — AB (ref 0.44–1.00)
Calcium: 8.9 mg/dL (ref 8.9–10.3)
Chloride: 98 mmol/L — ABNORMAL LOW (ref 101–111)
GFR calc Af Amer: 6 mL/min — ABNORMAL LOW (ref >60)
GFR, EST NON AFRICAN AMERICAN: 5 mL/min — AB (ref >60)
GLUCOSE: 86 mg/dL (ref 65–99)
PHOSPHORUS: 7.2 mg/dL — AB (ref 2.5–4.6)
POTASSIUM: 3.9 mmol/L (ref 3.5–5.1)
SODIUM: 136 mmol/L (ref 135–145)

## 2015-03-04 LAB — BASIC METABOLIC PANEL
Anion gap: 18 — ABNORMAL HIGH (ref 5–15)
BUN: 104 mg/dL — AB (ref 6–20)
CALCIUM: 9.2 mg/dL (ref 8.9–10.3)
CHLORIDE: 94 mmol/L — AB (ref 101–111)
CO2: 24 mmol/L (ref 22–32)
CREATININE: 12.53 mg/dL — AB (ref 0.44–1.00)
GFR, EST AFRICAN AMERICAN: 4 mL/min — AB (ref >60)
GFR, EST NON AFRICAN AMERICAN: 3 mL/min — AB (ref >60)
Glucose, Bld: 107 mg/dL — ABNORMAL HIGH (ref 65–99)
Potassium: 4.8 mmol/L (ref 3.5–5.1)
SODIUM: 136 mmol/L (ref 135–145)

## 2015-03-04 LAB — CBC WITH DIFFERENTIAL/PLATELET
Basophils Absolute: 0 10*3/uL (ref 0.0–0.1)
Basophils Relative: 0 %
Eosinophils Absolute: 0 10*3/uL (ref 0.0–0.7)
Eosinophils Relative: 0 %
HEMATOCRIT: 26.5 % — AB (ref 36.0–46.0)
HEMOGLOBIN: 8.5 g/dL — AB (ref 12.0–15.0)
LYMPHS ABS: 0.7 10*3/uL (ref 0.7–4.0)
LYMPHS PCT: 10 %
MCH: 28.8 pg (ref 26.0–34.0)
MCHC: 32.1 g/dL (ref 30.0–36.0)
MCV: 89.8 fL (ref 78.0–100.0)
MONOS PCT: 3 %
Monocytes Absolute: 0.2 10*3/uL (ref 0.1–1.0)
NEUTROS PCT: 87 %
Neutro Abs: 6.5 10*3/uL (ref 1.7–7.7)
PLATELETS: 265 10*3/uL (ref 150–400)
RBC: 2.95 MIL/uL — AB (ref 3.87–5.11)
RDW: 17.9 % — AB (ref 11.5–15.5)
WBC: 7.4 10*3/uL (ref 4.0–10.5)

## 2015-03-04 LAB — IRON AND TIBC
IRON: 44 ug/dL (ref 28–170)
SATURATION RATIOS: 20 % (ref 10.4–31.8)
TIBC: 225 ug/dL — AB (ref 250–450)
UIBC: 181 ug/dL

## 2015-03-04 LAB — CBC
HEMATOCRIT: 22.4 % — AB (ref 36.0–46.0)
Hemoglobin: 7.3 g/dL — ABNORMAL LOW (ref 12.0–15.0)
MCH: 28.6 pg (ref 26.0–34.0)
MCHC: 32.6 g/dL (ref 30.0–36.0)
MCV: 87.8 fL (ref 78.0–100.0)
PLATELETS: 216 10*3/uL (ref 150–400)
RBC: 2.55 MIL/uL — ABNORMAL LOW (ref 3.87–5.11)
RDW: 17.6 % — AB (ref 11.5–15.5)
WBC: 8.6 10*3/uL (ref 4.0–10.5)

## 2015-03-04 MED ORDER — SODIUM CHLORIDE 0.9 % IV SOLN: 100.0000 mL | INTRAVENOUS | Status: DC | PRN

## 2015-03-04 MED ORDER — DARBEPOETIN ALFA 100 MCG/0.5ML IJ SOSY
100.0000 ug | PREFILLED_SYRINGE | Freq: Once | INTRAMUSCULAR | Status: AC
  Administered 2015-03-04: 100 ug via INTRAVENOUS
  Filled 2015-03-04: qty 0.5

## 2015-03-04 MED ORDER — LIDOCAINE HCL (PF) 1 % IJ SOLN: 5.0000 mL | INTRAMUSCULAR | Status: DC | PRN

## 2015-03-04 MED ORDER — ALTEPLASE 2 MG IJ SOLR: 2.0000 mg | Freq: Once | INTRAMUSCULAR | Status: DC | PRN

## 2015-03-04 MED ORDER — HEPARIN SODIUM (PORCINE) 1000 UNIT/ML DIALYSIS: 1000.0000 [IU] | INTRAMUSCULAR | Status: DC | PRN

## 2015-03-04 MED ORDER — LIDOCAINE-PRILOCAINE 2.5-2.5 % EX CREA
1.0000 | TOPICAL_CREAM | CUTANEOUS | Status: DC | PRN
Start: 2015-03-04 — End: 2015-03-04

## 2015-03-04 MED ORDER — DARBEPOETIN ALFA 100 MCG/0.5ML IJ SOSY
PREFILLED_SYRINGE | INTRAMUSCULAR | Status: AC
  Filled 2015-03-04: qty 0.5

## 2015-03-04 MED ORDER — PENTAFLUOROPROP-TETRAFLUOROETH EX AERO
1.0000 | INHALATION_SPRAY | CUTANEOUS | Status: DC | PRN
Start: 2015-03-04 — End: 2015-03-04

## 2015-03-04 MED ORDER — HEPARIN SODIUM (PORCINE) 1000 UNIT/ML DIALYSIS: 100.0000 [IU]/kg | INTRAMUSCULAR | Status: DC | PRN

## 2015-03-04 NOTE — Procedures (Signed)
I was present at this session.  I have reviewed the session itself and made appropriate changes.  BP ^, access pressok. Lower vol.    Trica Usery L 11/27/201610:55 AM
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
## 2015-03-04 NOTE — ED Notes (Signed)
Pt given apple juice and sprite per request

## 2015-03-04 NOTE — Progress Notes (Signed)
Pt signed off tx with 20 mins left; Dr. Jeneen Rinks Deterding was on the unit; pt tolerated tx well denies pain, n/v, dizziness or SOB; advised pt not to miss next tx due to excessive fluid gain; pt dressed and walked off the unit to go home.

## 2015-03-04 NOTE — ED Notes (Signed)
Dialysis department aware that patient is here and states it will be a couple of hours.  Patient made aware.  Menu provided to order breakfast.

## 2015-03-04 NOTE — ED Notes (Signed)
Patient here for dialysis. 

## 2015-03-05 LAB — HEPATITIS B SURFACE ANTIGEN: HEP B S AG: NEGATIVE

## 2015-03-06 ENCOUNTER — Non-Acute Institutional Stay (HOSPITAL_COMMUNITY)
Admission: EM | Admit: 2015-03-06 | Discharge: 2015-03-06 | Disposition: A | Discharge status: Home / Self Care | Payer: Medicare Other | Attending: Physician Assistant | Admitting: Physician Assistant

## 2015-03-06 ENCOUNTER — Encounter (HOSPITAL_COMMUNITY): Payer: Self-pay | Admitting: Emergency Medicine

## 2015-03-06 DIAGNOSIS — Z992 Dependence on renal dialysis: Secondary | ICD-10-CM | POA: Insufficient documentation

## 2015-03-06 DIAGNOSIS — F17210 Nicotine dependence, cigarettes, uncomplicated: Secondary | ICD-10-CM | POA: Insufficient documentation

## 2015-03-06 DIAGNOSIS — F209 Schizophrenia, unspecified: Secondary | ICD-10-CM | POA: Diagnosis not present

## 2015-03-06 DIAGNOSIS — I5022 Chronic systolic (congestive) heart failure: Secondary | ICD-10-CM | POA: Diagnosis not present

## 2015-03-06 DIAGNOSIS — M3214 Glomerular disease in systemic lupus erythematosus: Secondary | ICD-10-CM | POA: Insufficient documentation

## 2015-03-06 DIAGNOSIS — Z4689 Encounter for fitting and adjustment of other specified devices: Secondary | ICD-10-CM | POA: Diagnosis not present

## 2015-03-06 DIAGNOSIS — N186 End stage renal disease: Secondary | ICD-10-CM | POA: Diagnosis not present

## 2015-03-06 DIAGNOSIS — F319 Bipolar disorder, unspecified: Secondary | ICD-10-CM | POA: Insufficient documentation

## 2015-03-06 DIAGNOSIS — I132 Hypertensive heart and chronic kidney disease with heart failure and with stage 5 chronic kidney disease, or end stage renal disease: Secondary | ICD-10-CM | POA: Insufficient documentation

## 2015-03-06 DIAGNOSIS — Z79899 Other long term (current) drug therapy: Secondary | ICD-10-CM | POA: Diagnosis not present

## 2015-03-06 DIAGNOSIS — I120 Hypertensive chronic kidney disease with stage 5 chronic kidney disease or end stage renal disease: Secondary | ICD-10-CM | POA: Diagnosis not present

## 2015-03-06 DIAGNOSIS — D649 Anemia, unspecified: Secondary | ICD-10-CM | POA: Diagnosis not present

## 2015-03-06 LAB — I-STAT CHEM 8, ED
BUN: 82 mg/dL — ABNORMAL HIGH (ref 6–20)
CALCIUM ION: 1.09 mmol/L — AB (ref 1.12–1.23)
CHLORIDE: 100 mmol/L — AB (ref 101–111)
Creatinine, Ser: 11.1 mg/dL — ABNORMAL HIGH (ref 0.44–1.00)
Glucose, Bld: 76 mg/dL (ref 65–99)
HCT: 30 % — ABNORMAL LOW (ref 36.0–46.0)
Hemoglobin: 10.2 g/dL — ABNORMAL LOW (ref 12.0–15.0)
Potassium: 4.7 mmol/L (ref 3.5–5.1)
SODIUM: 136 mmol/L (ref 135–145)
TCO2: 22 mmol/L (ref 0–100)

## 2015-03-06 LAB — CBC
HCT: 23.7 % — ABNORMAL LOW (ref 36.0–46.0)
HCT: 26.7 % — ABNORMAL LOW (ref 36.0–46.0)
Hemoglobin: 7.6 g/dL — ABNORMAL LOW (ref 12.0–15.0)
Hemoglobin: 8.7 g/dL — ABNORMAL LOW (ref 12.0–15.0)
MCH: 28.5 pg (ref 26.0–34.0)
MCH: 28.5 pg (ref 26.0–34.0)
MCHC: 32.1 g/dL (ref 30.0–36.0)
MCHC: 32.6 g/dL (ref 30.0–36.0)
MCV: 88.8 fL (ref 78.0–100.0)
MCV: 87.5 fL (ref 78.0–100.0)
PLATELETS: 217 10*3/uL (ref 150–400)
Platelets: 213 10*3/uL (ref 150–400)
RBC: 2.67 MIL/uL — ABNORMAL LOW (ref 3.87–5.11)
RBC: 3.05 MIL/uL — AB (ref 3.87–5.11)
RDW: 17.6 % — AB (ref 11.5–15.5)
RDW: 17.5 % — ABNORMAL HIGH (ref 11.5–15.5)
WBC: 8.4 10*3/uL (ref 4.0–10.5)
WBC: 8.6 10*3/uL (ref 4.0–10.5)

## 2015-03-06 LAB — RENAL FUNCTION PANEL
ALBUMIN: 2.9 g/dL — AB (ref 3.5–5.0)
Anion gap: 15 (ref 5–15)
BUN: 83 mg/dL — AB (ref 6–20)
CALCIUM: 9.1 mg/dL (ref 8.9–10.3)
CO2: 23 mmol/L (ref 22–32)
Chloride: 100 mmol/L — ABNORMAL LOW (ref 101–111)
Creatinine, Ser: 11.41 mg/dL — ABNORMAL HIGH (ref 0.44–1.00)
GFR calc Af Amer: 4 mL/min — ABNORMAL LOW (ref >60)
GFR calc non Af Amer: 4 mL/min — ABNORMAL LOW (ref >60)
GLUCOSE: 101 mg/dL — AB (ref 65–99)
PHOSPHORUS: 10.8 mg/dL — AB (ref 2.5–4.6)
Potassium: 4.7 mmol/L (ref 3.5–5.1)
SODIUM: 138 mmol/L (ref 135–145)

## 2015-03-06 MED ORDER — SODIUM CHLORIDE 0.9 % IV SOLN: 100.0000 mL | INTRAVENOUS | Status: DC | PRN

## 2015-03-06 MED ORDER — LIDOCAINE HCL (PF) 1 % IJ SOLN: 5.0000 mL | INTRAMUSCULAR | Status: DC | PRN

## 2015-03-06 MED ORDER — HEPARIN SODIUM (PORCINE) 1000 UNIT/ML DIALYSIS: 1000.0000 [IU] | INTRAMUSCULAR | Status: DC | PRN

## 2015-03-06 MED ORDER — PENTAFLUOROPROP-TETRAFLUOROETH EX AERO: 1.0000 "application " | INHALATION_SPRAY | CUTANEOUS | Status: DC | PRN

## 2015-03-06 MED ORDER — HEPARIN SODIUM (PORCINE) 1000 UNIT/ML DIALYSIS: 5000.0000 [IU] | Freq: Once | INTRAMUSCULAR | Status: DC

## 2015-03-06 MED ORDER — LIDOCAINE-PRILOCAINE 2.5-2.5 % EX CREA: 1.0000 "application " | TOPICAL_CREAM | CUTANEOUS | Status: DC | PRN

## 2015-03-06 MED ORDER — ALTEPLASE 2 MG IJ SOLR: 2.0000 mg | Freq: Once | INTRAMUSCULAR | Status: DC | PRN
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
## 2015-03-06 NOTE — ED Notes (Signed)
Pt here for routine dialysis today; pt last had dialysis on Sunday

## 2015-03-06 NOTE — ED Notes (Addendum)
Patient here with bilateral hand numbness and coldness, reports that it is happening daily and to have dialysis today at 1230. Reports that she is to be seen by rheumatologist for this condition. Denies trauma. No problem on Sunday with dialysis graft. She did not report to dialysis staff about her hands becoming cold

## 2015-03-06 NOTE — Progress Notes (Signed)
Patient signed off with 30 minutes remaining. Patient tolerated hemodialysis well. No verbalized concern. Vital signs are stable. Patient discharged home.

## 2015-03-06 NOTE — ED Notes (Signed)
Transported to dialysis unit.

## 2015-03-07 DIAGNOSIS — M25551 Pain in right hip: Secondary | ICD-10-CM | POA: Insufficient documentation

## 2015-03-09 ENCOUNTER — Emergency Department (HOSPITAL_COMMUNITY)
Admission: EM | Admit: 2015-03-09 | Discharge: 2015-03-09 | Disposition: A | Discharge status: Home / Self Care | Payer: Medicare Other | Attending: Emergency Medicine | Admitting: Emergency Medicine

## 2015-03-09 ENCOUNTER — Emergency Department (HOSPITAL_COMMUNITY)
Admission: EM | Admit: 2015-03-09 | Discharge: 2015-03-09 | Disposition: A | Discharge status: Home / Self Care | Payer: Medicare Other | Source: Home / Self Care | Attending: Emergency Medicine | Admitting: Emergency Medicine

## 2015-03-09 ENCOUNTER — Encounter (HOSPITAL_COMMUNITY): Payer: Self-pay

## 2015-03-09 DIAGNOSIS — N186 End stage renal disease: Secondary | ICD-10-CM | POA: Insufficient documentation

## 2015-03-09 DIAGNOSIS — Z8739 Personal history of other diseases of the musculoskeletal system and connective tissue: Secondary | ICD-10-CM | POA: Diagnosis not present

## 2015-03-09 DIAGNOSIS — I509 Heart failure, unspecified: Secondary | ICD-10-CM | POA: Insufficient documentation

## 2015-03-09 DIAGNOSIS — I120 Hypertensive chronic kidney disease with stage 5 chronic kidney disease or end stage renal disease: Secondary | ICD-10-CM | POA: Insufficient documentation

## 2015-03-09 DIAGNOSIS — Z4931 Encounter for adequacy testing for hemodialysis: Secondary | ICD-10-CM | POA: Diagnosis present

## 2015-03-09 DIAGNOSIS — Z862 Personal history of diseases of the blood and blood-forming organs and certain disorders involving the immune mechanism: Secondary | ICD-10-CM | POA: Diagnosis not present

## 2015-03-09 DIAGNOSIS — Z79899 Other long term (current) drug therapy: Secondary | ICD-10-CM | POA: Insufficient documentation

## 2015-03-09 DIAGNOSIS — Z992 Dependence on renal dialysis: Secondary | ICD-10-CM | POA: Insufficient documentation

## 2015-03-09 DIAGNOSIS — Z7952 Long term (current) use of systemic steroids: Secondary | ICD-10-CM | POA: Diagnosis not present

## 2015-03-09 DIAGNOSIS — F17210 Nicotine dependence, cigarettes, uncomplicated: Secondary | ICD-10-CM | POA: Insufficient documentation

## 2015-03-09 DIAGNOSIS — F912 Conduct disorder, adolescent-onset type: Secondary | ICD-10-CM | POA: Diagnosis not present

## 2015-03-09 DIAGNOSIS — Z8659 Personal history of other mental and behavioral disorders: Secondary | ICD-10-CM | POA: Diagnosis not present

## 2015-03-09 DIAGNOSIS — E875 Hyperkalemia: Secondary | ICD-10-CM | POA: Diagnosis not present

## 2015-03-09 DIAGNOSIS — F29 Unspecified psychosis not due to a substance or known physiological condition: Secondary | ICD-10-CM | POA: Diagnosis not present

## 2015-03-09 LAB — BASIC METABOLIC PANEL
ANION GAP: 16 — AB (ref 5–15)
BUN: 83 mg/dL — ABNORMAL HIGH (ref 6–20)
CALCIUM: 9.3 mg/dL (ref 8.9–10.3)
CO2: 24 mmol/L (ref 22–32)
Chloride: 97 mmol/L — ABNORMAL LOW (ref 101–111)
Creatinine, Ser: 12.42 mg/dL — ABNORMAL HIGH (ref 0.44–1.00)
GFR, EST AFRICAN AMERICAN: 4 mL/min — AB (ref >60)
GFR, EST NON AFRICAN AMERICAN: 3 mL/min — AB (ref >60)
GLUCOSE: 88 mg/dL (ref 65–99)
Potassium: 4.9 mmol/L (ref 3.5–5.1)
Sodium: 137 mmol/L (ref 135–145)

## 2015-03-09 NOTE — Discharge Instructions (Signed)
Recheck as needed °
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
## 2015-03-09 NOTE — ED Notes (Signed)
Pt states she occasionally comes here for dialysis because she is not set up with a dialysis center.  Pt states she is dialyzed on Tues, Thurs, and Sat.  Pt states she has mild sob.   Pt states she has had dialysis here before and that "they just have to call the dialysis nurse in to do her"

## 2015-03-09 NOTE — ED Notes (Signed)
Pt returns now stating she is checking back in so she can get dialysis today.  Pt states she went outside to smoke and to get something to drink and signed back in

## 2015-03-09 NOTE — ED Notes (Signed)
Pt came to desk to inquire about the wait time for her to get her dialysis.  Pt is somewhat insistent that she be prioritized to be seen so that she can be admitted for dialysis.  Pt requesting something to drink and states "If I can't get something to drink I'm going to sign out so I can smoke and then get something to drink and then I will check back in.  Informed pt that if she left the property, she would have to start over and wait to be seen by the e.r. Doctor.  Pt continually asking "where is the doctor and when will she see me?"   Pt was informed of the extended wait due to multiple emergencies and ems patients that have recently arrived. Pt requested to speak with supervisor and Greggory Keen here to explain same information that was just given to the patient.  Patient walked out of her room, fully dressed, down the hall and out of the department.

## 2015-03-09 NOTE — ED Notes (Signed)
Pt given discharge instructions for follow up with [facility]. She questioned what was done for her at this ED. Pt was given update on lab results by EDP and confirmed that K+ level was within normal limits per EDP. Pt was advised to seek treatment at a Kidney clinic for chronic dialysis treatment per instruction by EDP, and she was unhappy with this advice. Stated she would leave and made phone call.

## 2015-03-11 ENCOUNTER — Non-Acute Institutional Stay (HOSPITAL_COMMUNITY)
Admission: EM | Admit: 2015-03-11 | Discharge: 2015-03-11 | Disposition: A | Discharge status: Home / Self Care | Payer: Medicare Other | Source: Home / Self Care | Attending: Emergency Medicine | Admitting: Emergency Medicine

## 2015-03-11 ENCOUNTER — Encounter (HOSPITAL_COMMUNITY): Payer: Self-pay | Admitting: Emergency Medicine

## 2015-03-11 DIAGNOSIS — Z8739 Personal history of other diseases of the musculoskeletal system and connective tissue: Secondary | ICD-10-CM | POA: Diagnosis not present

## 2015-03-11 DIAGNOSIS — E875 Hyperkalemia: Secondary | ICD-10-CM

## 2015-03-11 DIAGNOSIS — F17210 Nicotine dependence, cigarettes, uncomplicated: Secondary | ICD-10-CM | POA: Diagnosis not present

## 2015-03-11 DIAGNOSIS — N186 End stage renal disease: Secondary | ICD-10-CM

## 2015-03-11 DIAGNOSIS — Z862 Personal history of diseases of the blood and blood-forming organs and certain disorders involving the immune mechanism: Secondary | ICD-10-CM | POA: Diagnosis not present

## 2015-03-11 DIAGNOSIS — Z8659 Personal history of other mental and behavioral disorders: Secondary | ICD-10-CM | POA: Diagnosis not present

## 2015-03-11 DIAGNOSIS — Z79899 Other long term (current) drug therapy: Secondary | ICD-10-CM | POA: Diagnosis not present

## 2015-03-11 DIAGNOSIS — Z7952 Long term (current) use of systemic steroids: Secondary | ICD-10-CM | POA: Diagnosis not present

## 2015-03-11 DIAGNOSIS — Z992 Dependence on renal dialysis: Secondary | ICD-10-CM

## 2015-03-11 DIAGNOSIS — I120 Hypertensive chronic kidney disease with stage 5 chronic kidney disease or end stage renal disease: Secondary | ICD-10-CM | POA: Diagnosis not present

## 2015-03-11 DIAGNOSIS — N189 Chronic kidney disease, unspecified: Secondary | ICD-10-CM | POA: Diagnosis present

## 2015-03-11 DIAGNOSIS — I509 Heart failure, unspecified: Secondary | ICD-10-CM | POA: Diagnosis not present

## 2015-03-11 LAB — I-STAT CHEM 8, ED
BUN: 112 mg/dL — AB (ref 6–20)
CALCIUM ION: 1.04 mmol/L — AB (ref 1.12–1.23)
CREATININE: 17.8 mg/dL — AB (ref 0.44–1.00)
Chloride: 100 mmol/L — ABNORMAL LOW (ref 101–111)
Glucose, Bld: 76 mg/dL (ref 65–99)
HEMATOCRIT: 24 % — AB (ref 36.0–46.0)
HEMOGLOBIN: 8.2 g/dL — AB (ref 12.0–15.0)
Potassium: 6.6 mmol/L (ref 3.5–5.1)
Sodium: 132 mmol/L — ABNORMAL LOW (ref 135–145)
TCO2: 19 mmol/L (ref 0–100)

## 2015-03-11 MED ORDER — ALTEPLASE 2 MG IJ SOLR
2.0000 mg | Freq: Once | INTRAMUSCULAR | Status: DC | PRN
  Filled 2015-03-11: qty 2

## 2015-03-11 MED ORDER — LIDOCAINE-PRILOCAINE 2.5-2.5 % EX CREA: 1.0000 "application " | TOPICAL_CREAM | CUTANEOUS | Status: DC | PRN

## 2015-03-11 MED ORDER — SODIUM BICARBONATE 8.4 % IV SOLN
50.0000 meq | Freq: Once | INTRAVENOUS | Status: DC
  Filled 2015-03-11: qty 50

## 2015-03-11 MED ORDER — HEPARIN SODIUM (PORCINE) 1000 UNIT/ML DIALYSIS: 4000.0000 [IU] | INTRAMUSCULAR | Status: DC | PRN

## 2015-03-11 MED ORDER — LIDOCAINE HCL (PF) 1 % IJ SOLN: 5.0000 mL | INTRAMUSCULAR | Status: DC | PRN

## 2015-03-11 MED ORDER — SODIUM CHLORIDE 0.9 % IV SOLN: 100.0000 mL | INTRAVENOUS | Status: DC | PRN

## 2015-03-11 MED ORDER — DEXTROSE 50 % IV SOLN
1.0000 | Freq: Once | INTRAVENOUS | Status: DC
  Filled 2015-03-11: qty 50

## 2015-03-11 MED ORDER — INSULIN ASPART 100 UNIT/ML ~~LOC~~ SOLN: 10.0000 [IU] | Freq: Once | SUBCUTANEOUS | Status: DC

## 2015-03-11 MED ORDER — PENTAFLUOROPROP-TETRAFLUOROETH EX AERO: 1.0000 "application " | INHALATION_SPRAY | CUTANEOUS | Status: DC | PRN

## 2015-03-11 MED ORDER — HEPARIN SODIUM (PORCINE) 1000 UNIT/ML DIALYSIS: 1000.0000 [IU] | INTRAMUSCULAR | Status: DC | PRN

## 2015-03-11 MED ORDER — SODIUM CHLORIDE 0.9 % IV SOLN
1.0000 g | Freq: Once | INTRAVENOUS | Status: DC
  Filled 2015-03-11: qty 10
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
## 2015-03-11 NOTE — ED Notes (Signed)
Pt. is here for her hemodialysis , no other complaints , respirations unlabored .

## 2015-03-12 ENCOUNTER — Encounter (HOSPITAL_COMMUNITY): Payer: Self-pay | Admitting: Emergency Medicine

## 2015-03-12 ENCOUNTER — Non-Acute Institutional Stay (HOSPITAL_COMMUNITY)
Admission: EM | Admit: 2015-03-12 | Discharge: 2015-03-14 | Disposition: A | Discharge status: Home / Self Care | Payer: Medicare Other | Attending: Emergency Medicine | Admitting: Emergency Medicine

## 2015-03-12 DIAGNOSIS — Z7952 Long term (current) use of systemic steroids: Secondary | ICD-10-CM | POA: Diagnosis not present

## 2015-03-12 DIAGNOSIS — F23 Brief psychotic disorder: Secondary | ICD-10-CM | POA: Diagnosis present

## 2015-03-12 DIAGNOSIS — I509 Heart failure, unspecified: Secondary | ICD-10-CM | POA: Diagnosis not present

## 2015-03-12 DIAGNOSIS — N186 End stage renal disease: Secondary | ICD-10-CM

## 2015-03-12 DIAGNOSIS — Z992 Dependence on renal dialysis: Secondary | ICD-10-CM | POA: Diagnosis not present

## 2015-03-12 DIAGNOSIS — Z8739 Personal history of other diseases of the musculoskeletal system and connective tissue: Secondary | ICD-10-CM | POA: Diagnosis not present

## 2015-03-12 DIAGNOSIS — F17210 Nicotine dependence, cigarettes, uncomplicated: Secondary | ICD-10-CM | POA: Diagnosis not present

## 2015-03-12 DIAGNOSIS — F912 Conduct disorder, adolescent-onset type: Secondary | ICD-10-CM | POA: Diagnosis not present

## 2015-03-12 DIAGNOSIS — Z862 Personal history of diseases of the blood and blood-forming organs and certain disorders involving the immune mechanism: Secondary | ICD-10-CM | POA: Diagnosis not present

## 2015-03-12 DIAGNOSIS — R4689 Other symptoms and signs involving appearance and behavior: Secondary | ICD-10-CM

## 2015-03-12 DIAGNOSIS — I120 Hypertensive chronic kidney disease with stage 5 chronic kidney disease or end stage renal disease: Secondary | ICD-10-CM | POA: Diagnosis not present

## 2015-03-12 DIAGNOSIS — F29 Unspecified psychosis not due to a substance or known physiological condition: Secondary | ICD-10-CM | POA: Diagnosis not present

## 2015-03-12 DIAGNOSIS — F319 Bipolar disorder, unspecified: Secondary | ICD-10-CM | POA: Diagnosis not present

## 2015-03-12 DIAGNOSIS — Z8659 Personal history of other mental and behavioral disorders: Secondary | ICD-10-CM | POA: Diagnosis not present

## 2015-03-12 DIAGNOSIS — Z79899 Other long term (current) drug therapy: Secondary | ICD-10-CM | POA: Diagnosis not present

## 2015-03-12 LAB — BASIC METABOLIC PANEL
Anion gap: 21 — ABNORMAL HIGH (ref 5–15)
BUN: 75 mg/dL — ABNORMAL HIGH (ref 6–20)
CALCIUM: 9.8 mg/dL (ref 8.9–10.3)
CHLORIDE: 93 mmol/L — AB (ref 101–111)
CO2: 20 mmol/L — AB (ref 22–32)
CREATININE: 12.37 mg/dL — AB (ref 0.44–1.00)
GFR calc Af Amer: 4 mL/min — ABNORMAL LOW (ref >60)
GFR calc non Af Amer: 3 mL/min — ABNORMAL LOW (ref >60)
GLUCOSE: 89 mg/dL (ref 65–99)
Potassium: 5.4 mmol/L — ABNORMAL HIGH (ref 3.5–5.1)
Sodium: 134 mmol/L — ABNORMAL LOW (ref 135–145)

## 2015-03-12 LAB — CBC WITH DIFFERENTIAL/PLATELET
Basophils Absolute: 0 10*3/uL (ref 0.0–0.1)
Basophils Relative: 0 %
Eosinophils Absolute: 0.1 10*3/uL (ref 0.0–0.7)
Eosinophils Relative: 1 %
HEMATOCRIT: 26.3 % — AB (ref 36.0–46.0)
Hemoglobin: 8.4 g/dL — ABNORMAL LOW (ref 12.0–15.0)
LYMPHS PCT: 14 %
Lymphs Abs: 1.1 10*3/uL (ref 0.7–4.0)
MCH: 27.8 pg (ref 26.0–34.0)
MCHC: 31.9 g/dL (ref 30.0–36.0)
MCV: 87.1 fL (ref 78.0–100.0)
MONO ABS: 0.7 10*3/uL (ref 0.1–1.0)
MONOS PCT: 9 %
NEUTROS ABS: 6.1 10*3/uL (ref 1.7–7.7)
Neutrophils Relative %: 76 %
Platelets: 210 10*3/uL (ref 150–400)
RBC: 3.02 MIL/uL — ABNORMAL LOW (ref 3.87–5.11)
RDW: 17.3 % — AB (ref 11.5–15.5)
WBC: 8 10*3/uL (ref 4.0–10.5)

## 2015-03-12 LAB — SALICYLATE LEVEL

## 2015-03-12 LAB — ACETAMINOPHEN LEVEL: Acetaminophen (Tylenol), Serum: <10 ug/mL — ABNORMAL LOW (ref 10–30)

## 2015-03-12 LAB — ETHANOL

## 2015-03-12 MED ORDER — HALOPERIDOL LACTATE 5 MG/ML IJ SOLN
5.0000 mg | Freq: Once | INTRAMUSCULAR | Status: AC
  Administered 2015-03-12: 5 mg via INTRAMUSCULAR
  Filled 2015-03-12: qty 1

## 2015-03-12 NOTE — ED Notes (Signed)
Pt upset about sitter at bedside; Sitter sitting outside of door. [employer] at bedside to remove belongings
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
## 2015-03-12 NOTE — ED Notes (Signed)
Charge RN made aware of pt not having a sitter at this time. Pt does not appear to know of IVC status and becomes easily agitated. Pt in gown and does not express signs of SI/HI.

## 2015-03-12 NOTE — ED Notes (Signed)
Pt attempting to remove restraints using teeth. Spoke to patient in regards to restraints and placement pt yelling for this RN to get out of room.

## 2015-03-12 NOTE — ED Notes (Signed)
Phlebotomy was at bedside, pt began yelling and screaming "liars" from room. Security and [employer] at bedside.

## 2015-03-12 NOTE — ED Notes (Signed)
Staffing contacted for sitter due to patient being IVC'd
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
## 2015-03-12 NOTE — ED Notes (Signed)
Per EMS, pt requesting dialysis. Pt's mother has taken out IVC paperwork on her.

## 2015-03-12 NOTE — ED Notes (Signed)
Pt refused additional blood work.

## 2015-03-12 NOTE — ED Notes (Signed)
Pt verbally aggressive toward security and [employer]. PA made aware of aggressive behavior.
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
## 2015-03-12 NOTE — ED Notes (Signed)
Pt refused cardiac monitoring. 

## 2015-03-12 NOTE — ED Notes (Signed)
Attempted to place cardiac monitor; Pt yelling at EMT and refusing again

## 2015-03-12 NOTE — ED Notes (Signed)
Sitter at bedside.

## 2015-03-13 ENCOUNTER — Non-Acute Institutional Stay (HOSPITAL_COMMUNITY): Payer: Medicare Other

## 2015-03-13 DIAGNOSIS — Z8739 Personal history of other diseases of the musculoskeletal system and connective tissue: Secondary | ICD-10-CM | POA: Diagnosis not present

## 2015-03-13 DIAGNOSIS — Z7952 Long term (current) use of systemic steroids: Secondary | ICD-10-CM | POA: Diagnosis not present

## 2015-03-13 DIAGNOSIS — Z79899 Other long term (current) drug therapy: Secondary | ICD-10-CM | POA: Diagnosis not present

## 2015-03-13 DIAGNOSIS — F17210 Nicotine dependence, cigarettes, uncomplicated: Secondary | ICD-10-CM | POA: Diagnosis not present

## 2015-03-13 DIAGNOSIS — I120 Hypertensive chronic kidney disease with stage 5 chronic kidney disease or end stage renal disease: Secondary | ICD-10-CM | POA: Diagnosis not present

## 2015-03-13 DIAGNOSIS — M1612 Unilateral primary osteoarthritis, left hip: Secondary | ICD-10-CM | POA: Diagnosis not present

## 2015-03-13 DIAGNOSIS — Z862 Personal history of diseases of the blood and blood-forming organs and certain disorders involving the immune mechanism: Secondary | ICD-10-CM | POA: Diagnosis not present

## 2015-03-13 DIAGNOSIS — Z8659 Personal history of other mental and behavioral disorders: Secondary | ICD-10-CM | POA: Diagnosis not present

## 2015-03-13 DIAGNOSIS — Z992 Dependence on renal dialysis: Secondary | ICD-10-CM | POA: Diagnosis not present

## 2015-03-13 DIAGNOSIS — I509 Heart failure, unspecified: Secondary | ICD-10-CM | POA: Diagnosis not present

## 2015-03-13 DIAGNOSIS — N186 End stage renal disease: Secondary | ICD-10-CM | POA: Diagnosis not present

## 2015-03-13 LAB — RENAL FUNCTION PANEL
ALBUMIN: 3 g/dL — AB (ref 3.5–5.0)
ANION GAP: 21 — AB (ref 5–15)
BUN: 81 mg/dL — ABNORMAL HIGH (ref 6–20)
CALCIUM: 9.7 mg/dL (ref 8.9–10.3)
CO2: 21 mmol/L — ABNORMAL LOW (ref 22–32)
Chloride: 94 mmol/L — ABNORMAL LOW (ref 101–111)
Creatinine, Ser: 13.61 mg/dL — ABNORMAL HIGH (ref 0.44–1.00)
GFR calc Af Amer: 4 mL/min — ABNORMAL LOW (ref >60)
GFR calc non Af Amer: 3 mL/min — ABNORMAL LOW (ref >60)
GLUCOSE: 69 mg/dL (ref 65–99)
PHOSPHORUS: 12.1 mg/dL — AB (ref 2.5–4.6)
Potassium: 5.3 mmol/L — ABNORMAL HIGH (ref 3.5–5.1)
SODIUM: 136 mmol/L (ref 135–145)

## 2015-03-13 LAB — CBC
HCT: 24.4 % — ABNORMAL LOW (ref 36.0–46.0)
HEMOGLOBIN: 7.9 g/dL — AB (ref 12.0–15.0)
MCH: 27.7 pg (ref 26.0–34.0)
MCHC: 32.4 g/dL (ref 30.0–36.0)
MCV: 85.6 fL (ref 78.0–100.0)
Platelets: 151 10*3/uL (ref 150–400)
RBC: 2.85 MIL/uL — AB (ref 3.87–5.11)
RDW: 16.7 % — ABNORMAL HIGH (ref 11.5–15.5)
WBC: 6.7 10*3/uL (ref 4.0–10.5)

## 2015-03-13 MED ORDER — HYDROCODONE-ACETAMINOPHEN 5-325 MG PO TABS: 1.0000 | ORAL_TABLET | Freq: Once | ORAL | Status: DC

## 2015-03-13 MED ORDER — ACETAMINOPHEN 500 MG PO TABS: 500.0000 mg | ORAL_TABLET | Freq: Four times a day (QID) | ORAL | Status: DC | PRN

## 2015-03-13 MED ORDER — SODIUM CHLORIDE 0.9 % IV SOLN: 100.0000 mL | INTRAVENOUS | Status: DC | PRN

## 2015-03-13 MED ORDER — ALTEPLASE 2 MG IJ SOLR
2.0000 mg | Freq: Once | INTRAMUSCULAR | Status: DC | PRN
  Filled 2015-03-13: qty 2

## 2015-03-13 MED ORDER — LIDOCAINE HCL (PF) 1 % IJ SOLN: 5.0000 mL | INTRAMUSCULAR | Status: DC | PRN

## 2015-03-13 MED ORDER — HALOPERIDOL 5 MG PO TABS
5.0000 mg | ORAL_TABLET | Freq: Once | ORAL | Status: AC
  Administered 2015-03-13: 5 mg via ORAL
  Filled 2015-03-13: qty 1

## 2015-03-13 MED ORDER — IBUPROFEN 400 MG PO TABS
400.0000 mg | ORAL_TABLET | Freq: Four times a day (QID) | ORAL | Status: DC | PRN
  Administered 2015-03-14: 400 mg via ORAL
  Filled 2015-03-13: qty 1

## 2015-03-13 MED ORDER — PREDNISONE 20 MG PO TABS
60.0000 mg | ORAL_TABLET | Freq: Once | ORAL | Status: AC
  Administered 2015-03-13: 60 mg via ORAL
  Filled 2015-03-13: qty 3

## 2015-03-13 MED ORDER — CLOBETASOL PROPIONATE 0.05 % EX CREA
TOPICAL_CREAM | Freq: Two times a day (BID) | CUTANEOUS | Status: DC
  Administered 2015-03-13: 21:00:00 via TOPICAL
  Administered 2015-03-14: 1 via TOPICAL
  Filled 2015-03-13: qty 15

## 2015-03-13 MED ORDER — TRIAMCINOLONE ACETONIDE 0.025 % EX OINT: TOPICAL_OINTMENT | Freq: Two times a day (BID) | CUTANEOUS | Status: DC

## 2015-03-13 MED ORDER — LIDOCAINE-PRILOCAINE 2.5-2.5 % EX CREA
1.0000 "application " | TOPICAL_CREAM | CUTANEOUS | Status: DC | PRN
  Filled 2015-03-13: qty 5

## 2015-03-13 MED ORDER — HYDROCODONE-ACETAMINOPHEN 5-325 MG PO TABS
1.0000 | ORAL_TABLET | Freq: Once | ORAL | Status: AC
  Administered 2015-03-13: 1 via ORAL
  Filled 2015-03-13: qty 1

## 2015-03-13 MED ORDER — PENTAFLUOROPROP-TETRAFLUOROETH EX AERO
1.0000 "application " | INHALATION_SPRAY | CUTANEOUS | Status: DC | PRN
  Filled 2015-03-13: qty 30

## 2015-03-13 MED ORDER — HEPARIN SODIUM (PORCINE) 1000 UNIT/ML DIALYSIS
20.0000 [IU]/kg | INTRAMUSCULAR | Status: DC | PRN
  Filled 2015-03-13: qty 2

## 2015-03-13 MED ORDER — HEPARIN SODIUM (PORCINE) 1000 UNIT/ML DIALYSIS: 5000.0000 [IU] | Freq: Once | INTRAMUSCULAR | Status: DC

## 2015-03-13 MED ORDER — PREDNISONE 20 MG PO TABS
40.0000 mg | ORAL_TABLET | Freq: Every day | ORAL | Status: DC
  Administered 2015-03-14: 40 mg via ORAL
  Filled 2015-03-13: qty 2

## 2015-03-13 MED ORDER — HEPARIN SODIUM (PORCINE) 1000 UNIT/ML DIALYSIS
1000.0000 [IU] | INTRAMUSCULAR | Status: DC | PRN
  Filled 2015-03-13: qty 1

## 2015-03-13 MED ORDER — CLOBETASOL PROPIONATE 0.05 % EX SOLN: 1.0000 "application " | Freq: Two times a day (BID) | CUTANEOUS | Status: DC

## 2015-03-13 MED ORDER — TRIAMCINOLONE ACETONIDE 0.025 % EX CREA
TOPICAL_CREAM | Freq: Two times a day (BID) | CUTANEOUS | Status: DC
  Administered 2015-03-13: 21:00:00 via TOPICAL
  Administered 2015-03-14: 1 via TOPICAL
  Filled 2015-03-13: qty 15

## 2015-03-13 MED ORDER — METOPROLOL TARTRATE 25 MG PO TABS
50.0000 mg | ORAL_TABLET | Freq: Two times a day (BID) | ORAL | Status: DC
  Administered 2015-03-13 – 2015-03-14 (×2): 50 mg via ORAL
  Filled 2015-03-13 (×2): qty 2
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
## 2015-03-13 NOTE — ED Notes (Signed)
Patient transferred to room. Sleeping on stretcher. Sitter at door. No distress noted.

## 2015-03-13 NOTE — ED Notes (Signed)
Spoke with patient regarding phone privileges. Patient verbally aggressive with staff.
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
## 2015-03-13 NOTE — ED Notes (Signed)
Pt received first dinner tray and stated that the chicken was too raw. Staff reordered another dinner tray and pt again stated that the chicken was too raw. Cafeteria closed. Gave pt a [place] sandwich. Chicken did not look raw to staff and patient ate breading and cheese off of chicken.

## 2015-03-13 NOTE — ED Notes (Signed)
Pt is in hemodialysis
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
## 2015-03-13 NOTE — Progress Notes (Signed)
Spoke with pt's mother via phone. [place]  Pt's mother states pt has been living with a friend but got into a disagreement and was asked to leave that home this week. States yesterday pt became angry with mother and brother and "threateened to kill [place]." States pt's anger has been escalating over past couple of weeks. States she believes pt missed her last scheduled injectable and is unsure if she has been taking oral medications as well, stating, "she starts feeling good and stops taking her meds, it's a cycle." States mother took her to [facility] last night "but she ran out of there, she didn't to go in." States at that point mother took out IVC at [employer] office.   Pt was evaluated by TTS today. At psychiatry's request, CSW attempted to obtain ROI for [facility] from pt so that medication records could be sent and, if in fact pt has been missing scheduled medications, that could be addressed. However, pt declined to provide consent for communication with [facility].   CSW relayed this information to TTS and was informed that recommendation is for pt to remain in ED under IVC and will be re-evaluated.  Will continue following case.  Sharren Bridge, MSW, LCSW Clinical Social Work, Disposition  03/13/2015 [phone]

## 2015-03-13 NOTE — BH Assessment (Signed)
Per Heloise Purpura patient will remain in the ED and will be re-assessed in the morning.

## 2015-03-13 NOTE — ED Notes (Signed)
IVC papers placed in red folder in social work office, copies also placed in medical records and patients folder.

## 2015-03-13 NOTE — Progress Notes (Signed)
Pt was assessed this morning and CSW was requested by TTS to contact pt's mother as she reportedly filed IVC.   Unable to reach pt's mother after multiple attempts [phone], voicemail full.   Per psych team, while pt seems psychiatrically at baseline this morning, requesting to hold disposition until mother can be reached.  CSW left contact number as well as contact number for TTS with [facility].   Sharren Bridge, MSW, LCSW Clinical Social Work, Disposition  03/13/2015 [phone]

## 2015-03-13 NOTE — ED Notes (Signed)
Pt very agitated, constantly using the call bell and jelling out. Pt taking a shower now, security called for safety and agitation.
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
## 2015-03-13 NOTE — ED Notes (Signed)
Pt up to nurse station making her last phone call for the day.

## 2015-03-13 NOTE — ED Notes (Signed)
Pt resting and cooperative. Sitter at bedside. Restraints released at [place]

## 2015-03-13 NOTE — ED Notes (Signed)
Pt transported to Hemodialysis on stretcher with tech and sitter.

## 2015-03-13 NOTE — ED Notes (Signed)
EDP at bedside speaking with patient regarding her hip pain. Will speak with patient regarding daily medications.

## 2015-03-13 NOTE — ED Notes (Signed)
Pt having consult with TTS via tele.  Pt remaining calm and cooperative.

## 2015-03-14 DIAGNOSIS — Z8659 Personal history of other mental and behavioral disorders: Secondary | ICD-10-CM | POA: Diagnosis not present

## 2015-03-14 DIAGNOSIS — F17210 Nicotine dependence, cigarettes, uncomplicated: Secondary | ICD-10-CM | POA: Diagnosis not present

## 2015-03-14 DIAGNOSIS — Z862 Personal history of diseases of the blood and blood-forming organs and certain disorders involving the immune mechanism: Secondary | ICD-10-CM | POA: Diagnosis not present

## 2015-03-14 DIAGNOSIS — Z79899 Other long term (current) drug therapy: Secondary | ICD-10-CM | POA: Diagnosis not present

## 2015-03-14 DIAGNOSIS — Z8739 Personal history of other diseases of the musculoskeletal system and connective tissue: Secondary | ICD-10-CM | POA: Diagnosis not present

## 2015-03-14 DIAGNOSIS — I509 Heart failure, unspecified: Secondary | ICD-10-CM | POA: Diagnosis not present

## 2015-03-14 DIAGNOSIS — Z7952 Long term (current) use of systemic steroids: Secondary | ICD-10-CM | POA: Diagnosis not present

## 2015-03-14 DIAGNOSIS — N186 End stage renal disease: Secondary | ICD-10-CM | POA: Diagnosis not present

## 2015-03-14 DIAGNOSIS — Z992 Dependence on renal dialysis: Secondary | ICD-10-CM | POA: Diagnosis not present

## 2015-03-14 DIAGNOSIS — F29 Unspecified psychosis not due to a substance or known physiological condition: Secondary | ICD-10-CM | POA: Diagnosis not present

## 2015-03-14 DIAGNOSIS — I120 Hypertensive chronic kidney disease with stage 5 chronic kidney disease or end stage renal disease: Secondary | ICD-10-CM | POA: Diagnosis not present

## 2015-03-14 NOTE — ED Notes (Signed)
Patient ambulated to restroom and tolerated well.  

## 2015-03-14 NOTE — ED Notes (Signed)
Ordered patient another breakfast per her request.   Patient calm and cooperative at this time.

## 2015-03-14 NOTE — ED Notes (Signed)
Called to [facility] per patient request to find out when they will try to telepsych her.   Tried to call Megan x 2, no answer.
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
## 2015-03-14 NOTE — ED Provider Notes (Signed)
Pt to have repeat TTS eval today She is under IVC per nursing BP 151/73 mmHg  Pulse 88  Temp(Src) 98.2 F (36.8 C) (Oral)  Resp 16  Wt 56.7 kg  SpO2 100% She had dialysis yesterday   Ripley Fraise, MD 03/14/15 1100

## 2015-03-14 NOTE — Progress Notes (Deleted)
Subjective/Objective No subjective & objective note has been filed under this hospital service for this encounter.   Scheduled Meds: . clobetasol cream   Topical BID  . heparin  5,000 Units Dialysis Once in dialysis  . HYDROcodone-acetaminophen  1 tablet Oral Once  . metoprolol  50 mg Oral BID  . predniSONE  40 mg Oral Q breakfast  . triamcinolone   Topical BID   Continuous Infusions: . sodium chloride    . sodium chloride     PRN Meds:sodium chloride, sodium chloride, acetaminophen, alteplase, heparin, heparin, ibuprofen, lidocaine (PF), lidocaine-prilocaine, pentafluoroprop-tetrafluoroeth  Vital signs in last 24 hours: Temp:  [98.1 F (36.7 C)-98.3 F (36.8 C)] 98.1 F (36.7 C) (12/07 1134) Pulse Rate:  [71-112] 71 (12/07 1134) Resp:  [16-20] 20 (12/07 1134) BP: (151-155)/(73-114) 154/101 mmHg (12/07 1134) SpO2:  [99 %-100 %] 100 % (12/07 1134)  Intake/Output last 3 shifts: I/O last 3 completed shifts: In: -  Out: 2885 [Other:2885] Intake/Output this shift: Total I/O In: 240 [P.O.:240] Out: -   Problem Assessment/Plan No new assessment & plan notes have been filed under this hospital service since the last note was generated. Service: Psychiatry
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
## 2015-03-14 NOTE — Discharge Instructions (Signed)
°Emergency Department Resource Guide °1) Find a Doctor and Pay Out of Pocket °Although you won't have to find out who is covered by your insurance plan, it is a good idea to ask around and get recommendations. You will then need to call the office and see if the doctor you have chosen will accept you as a new patient and what types of options they offer for patients who are self-pay. Some doctors offer discounts or will set up payment plans for their patients who do not have insurance, but you will need to ask so you aren't surprised when you get to your appointment. ° °2) Contact Your Local Health Department °Not all health departments have doctors that can see patients for sick visits, but many do, so it is worth a call to see if yours does. If you don't know where your local health department is, you can check in your phone book. The CDC also has a tool to help you locate your state's health department, and many state websites also have listings of all of their local health departments. ° °3) Find a Walk-in Clinic °If your illness is not likely to be very severe or complicated, you may want to try a walk in clinic. These are popping up all over the country in pharmacies, drugstores, and shopping centers. They're usually staffed by nurse practitioners or physician assistants that have been trained to treat common illnesses and complaints. They're usually fairly quick and inexpensive. However, if you have serious medical issues or chronic medical problems, these are probably not your best option. ° °No Primary Care Doctor: °- Call [facility] Connect at  832-8000 - they can help you locate a primary care doctor that  accepts your insurance, provides certain services, etc. °- Physician Referral Service- 1-800-533-3463 ° °Chronic Pain Problems: °Organization         Address  Phone   Notes  °[facility]  (336) 297-2271 Patients need to be referred by their primary care doctor.  ° °Medication  Assistance: °Organization         Address  Phone   Notes  °[place] [facility] [address] °[place], [place] [address] (336) 641-8030 --Must be a resident of [place] °-- Must have NO insurance coverage whatsoever (no Medicaid/ Medicare, etc.) °-- The pt. MUST have a primary care doctor that directs their care regularly and follows them in the community °  °[facility]  (866) 331-1348   °[employer]  (888) 892-1162   ° °Agencies that provide inexpensive medical care: °Organization         Address  Phone   Notes  °[facility]  (336) 832-8035   °[facility]    (336) 832-7272   °[facility] [address] °[place], [place] [address] (336) 832-4777   °[facility] [address], °[place] (336) 271-4999   °[facility]    (336) 373-0678   °[facility]    (336) 272-1050   °[facility] ° [address], [place] Phone:  (336) 832-4444, Fax:  (336) 832-4440 Hours of Operation:  9 am - 6 pm, M-F.  Also accepts Medicaid/Medicare and self-pay.  °[facility] ° [address], [place] Phone: (336) 832-3150, Fax: (336) 832-3151. Hours of Operation:  8:30 am - 5:30 pm, M-F.  Also accepts Medicaid and self-pay.  °[facility] [place] [address], [place] Phone: (336) 878-6027   °[facility] [address], [place], [place] (336)723-1848, Ext. 123 Mondays & Thursdays: 7-9 AM.  First 15 patients are seen on a first come, first serve basis. °  ° °Medicaid-accepting [place] Providers: ° °Organization         Address  Phone   Notes  °[facility] [address], [place] (336) 641-2100 Also accepts self-pay patients.  °[facility] [address], [place] ° (336) 856-9996   °[facility] [address], [place]  (336) 288-8857   °[facility] [address] [place] [address], [place] (336) 299-7000   °Veita Bland [address], [place]  ° (336) 373-1557 Only accepts [place] Access Medicaid patients after they have their name applied to their card.  ° °Self-Pay (no insurance) in [place]: ° °Organization         Address  Phone   Notes  °Sickle Cell Patients, [facility] [address], [place] (336) 832-1970   °[facility] [address], [place] (336) 832-4400   °[facility] ° [address], [place] (336) 992-4800   °[facility]/Dr. Osei-Bonsu ° [address] [place] [address], [place] or [address], [place] (336) 841-8500 Phone number for both [place] and [place] locations is the same.  °[facility] [address], [place] (336) 299-0000   °[facility] [address] [place] [address], [place] or [address] (336) 852-7530 °(336) 878-2260   °[facility] [address], [place] (336) 350-1642, phone; (336) 294-5005, fax Sees patients 1st and 3rd Saturday of every month.  Must not qualify for public or private insurance (i.e. Medicaid, Medicare, [place] Health Choice, Veterans' Benefits) • Household income should be no more than 200% of the poverty level •The clinic cannot treat you if you are pregnant or think you are pregnant • Sexually transmitted diseases are not treated at the clinic.  ° ° °Dental Care: °Organization         Address  Phone  Notes  °[place] [facility] [address], [place] (336) 641-6152 Accepts children up to age 21 who are enrolled in Medicaid or [place] Health Choice; pregnant women with a Medicaid card; and children who have applied for Medicaid or [place] Health Choice, but were declined, whose parents can pay a reduced fee at time of service.  °[place]  [facility] [place]  [address], [place] (336) 641-7733 Accepts children up to age 21 who are enrolled in Medicaid or [place] Health Choice; pregnant women with a Medicaid card; and children who have applied for Medicaid or  Health Choice, but were declined, whose parents can pay a reduced fee at time of service.  °[facility] ° [address], [place] (336) 641-4533 Patients are seen by appointment only. Walk-ins are not accepted. [facility] will see patients 18 years of age and older. °Monday - Tuesday (8am-5pm) °Most Wednesdays (8:30-5pm) °$30 per visit, cash only  °[facility] ° [address], [place] (336) 641-4533 Patients are seen by appointment only. Walk-ins are not accepted. [facility] will see patients 18 years of age and older. °One   Wednesday Evening (Monthly: Volunteer Based).  $30 per visit, cash only  °[facility]  (919) 537-3737 for adults; Children under age 4, call Graduate Pediatric Dentistry at (919) [phone]. Children aged 4-14, please call (919) [phone] to request a pediatric application. ° Dental services are provided in all areas of dental care including fillings, crowns and bridges, complete and partial dentures, implants, gum treatment, root canals, and extractions. Preventive care is also provided. Treatment is provided to both adults and children. °Patients are selected via a lottery and there is often a waiting list. °  °[facility] [address], °[place] ° (336) 763-8833 [email or web address] °  °[facility] [address], [place], [place] (336)723-1848, Ext. 123 Second and Fourth Thursday of each month, opens at 6:30 AM; Clinic ends at 9 AM.  Patients are seen on a first-come first-served basis, and a limited number are seen during each clinic.  ° °[facility] ° [address], [place], [place] (336) 723-7904    Eligibility Requirements °You must have lived in [place], or [place] counties for at least the last three months. °  You cannot be eligible for state or federal sponsored healthcare insurance, including Veterans Administration, Medicaid, or Medicare. °  You generally cannot be eligible for healthcare insurance through your employer.  °  How to apply: °Eligibility screenings are held every Tuesday and Wednesday afternoon from 1:00 pm until 4:00 pm. You do not need an appointment for the interview!  °[facility] [address], [place], [place] 336-631-2330   °[facility]  336-342-8273   °[facility]  336-703-3100   °[facility]  336-570-6415   ° °Behavioral Health Resources in the Community: °Intensive Outpatient Programs °Organization         Address  Phone  Notes  °[facility] [address], [place], [place] 336-878-6098   °[facility] [address], [place], [place] 336-832-9800   °[facility] [address], [place], [place] ° 336-882-2125   °[facility] [address],  °[place], [place] 1-800-853-5163 or 336-641-4981   °Substance Abuse Resources °Organization         Address  Phone  Notes  °[facility]  336-882-2125   °[facility]  336-784-9470   °[facility]  336-285-9073   °[facility]  336-845-3988   °Residential & Outpatient Substance Abuse Program  1-800-659-3381   °Psychological Services °Organization         Address  Phone  Notes  °[facility]  336- 832-9600   °[facility]  336- 378-7881   °[facility] [address], [place] 1-800-853-5163 or 336-641-4981   ° °Mobile Crisis Teams °Organization         Address  Phone  Notes  °[facility], Mobile Crisis Care Unit  1-877-626-1772   °Assertive °[facility] ° [address]  [place], [place] 336-834-9664   °Sharon DeEsch [address] °[place]  336-554-5454   ° °Self-Help/Support Groups °Organization         Address  Phone             Notes  °[facility] [place] - variety of support groups  336- 373-1402 Call for more information  °Narcotics Anonymous (NA), [facility] [address], °[place]   2 meetings at this location  ° °  Residential Treatment Programs °Organization         Address  Phone  Notes  °[facility] [address],    °[place]  1-866-801-8205   °[facility] ° [address], [place], [place] 704-293-8524   °[facility] [address], [place] 336-845-3988 Admissions: 8am-3pm M-F  °[facility] [address]    °[place], [place] 336-841-1104   °[facility] [address], [place], [place] 336-379-7146   °[facility] [address]  °[place], [place] 336-285-9073   °[facility] Intensive Outpatient [address], [place], [place] 336-852-3033   °[facility] [address]  °[place], [place] 1-877-615-2722 or 336-784-9470   °[facility] [address] [place], [place] 336-227-7417 Accepts Medicaid  °[facility] [address]  °[place] 1-800-659-3381 Substance Abuse/Addiction Treatment  ° °[place] Behavioral Health Resources °Organization         Address  Phone  Notes  °[facility]  (888) 581-9988   °Julie Brannon, PhD [address] [place], [place]   (336) 349-5553 or (336) 951-0000   °[facility]   [address] °[place], [place] (336) 349-4454   °[facility] [address], [place], [place] (336) 342-8316 Insurance/Medicaid/sponsorship through [facility]  °[facility] [address]                                    [place], [place] (336) 342-8316 Therapy/tele-psych/case    °[facility] [address]  ° [place], [place] (336) 349-2233    °Dr. Arfeen  (336) 349-4544   °[facility] [place]  [employer] [place] [facility] 1) [address],  °2) [address], [place] °3)  [address], [place] (336) 349-3220 °(336) 342-7768 ° °(336) 342-8140   °[place] [facility] (336) 342-1394 or (336) 342-3537 (After Hours)    ° ° °

## 2015-03-14 NOTE — ED Notes (Signed)
Patient has TTS machine at bedside.   Patient is currently asleep.

## 2015-03-14 NOTE — ED Notes (Signed)
Patient was given a snack and drink. Regular diet order taken for lunch.

## 2015-03-14 NOTE — ED Notes (Signed)
Patient coming out of room to nurses station and calling out on call bell.   Patient getting more and more agitated.

## 2015-03-14 NOTE — ED Provider Notes (Signed)
Pt has been cleared by psych for d/c home She denies SI/HI IVC will be rescinded   Ripley Fraise, MD 03/14/15 1331

## 2015-03-14 NOTE — ED Notes (Signed)
All rescind papers sent.   Confirmed that they were received with Janeece Riggers of Court.

## 2015-03-15 ENCOUNTER — Encounter (HOSPITAL_COMMUNITY): Payer: Self-pay | Admitting: Emergency Medicine

## 2015-03-15 ENCOUNTER — Emergency Department (HOSPITAL_COMMUNITY)
Admission: EM | Admit: 2015-03-15 | Discharge: 2015-03-15 | Disposition: A | Discharge status: Home / Self Care | Payer: Medicare Other | Attending: Emergency Medicine | Admitting: Emergency Medicine

## 2015-03-15 ENCOUNTER — Other Ambulatory Visit: Payer: Self-pay | Admitting: Internal Medicine

## 2015-03-15 DIAGNOSIS — Z992 Dependence on renal dialysis: Secondary | ICD-10-CM | POA: Insufficient documentation

## 2015-03-15 DIAGNOSIS — Z8659 Personal history of other mental and behavioral disorders: Secondary | ICD-10-CM | POA: Insufficient documentation

## 2015-03-15 DIAGNOSIS — F17210 Nicotine dependence, cigarettes, uncomplicated: Secondary | ICD-10-CM | POA: Diagnosis not present

## 2015-03-15 DIAGNOSIS — N186 End stage renal disease: Secondary | ICD-10-CM | POA: Diagnosis not present

## 2015-03-15 DIAGNOSIS — I120 Hypertensive chronic kidney disease with stage 5 chronic kidney disease or end stage renal disease: Secondary | ICD-10-CM | POA: Diagnosis not present

## 2015-03-15 DIAGNOSIS — I5022 Chronic systolic (congestive) heart failure: Secondary | ICD-10-CM | POA: Diagnosis not present

## 2015-03-15 DIAGNOSIS — Z862 Personal history of diseases of the blood and blood-forming organs and certain disorders involving the immune mechanism: Secondary | ICD-10-CM | POA: Insufficient documentation

## 2015-03-15 DIAGNOSIS — Z79899 Other long term (current) drug therapy: Secondary | ICD-10-CM | POA: Diagnosis not present

## 2015-03-15 DIAGNOSIS — M3210 Systemic lupus erythematosus, organ or system involvement unspecified: Secondary | ICD-10-CM | POA: Diagnosis not present

## 2015-03-15 DIAGNOSIS — M25552 Pain in left hip: Secondary | ICD-10-CM | POA: Diagnosis not present

## 2015-03-15 DIAGNOSIS — Z452 Encounter for adjustment and management of vascular access device: Secondary | ICD-10-CM | POA: Diagnosis present

## 2015-03-15 LAB — RENAL FUNCTION PANEL
Albumin: 2.9 g/dL — ABNORMAL LOW (ref 3.5–5.0)
Anion gap: 15 (ref 5–15)
BUN: 67 mg/dL — AB (ref 6–20)
CHLORIDE: 95 mmol/L — AB (ref 101–111)
CO2: 23 mmol/L (ref 22–32)
CREATININE: 10.05 mg/dL — AB (ref 0.44–1.00)
Calcium: 9.1 mg/dL (ref 8.9–10.3)
GFR calc Af Amer: 5 mL/min — ABNORMAL LOW (ref >60)
GFR, EST NON AFRICAN AMERICAN: 4 mL/min — AB (ref >60)
GLUCOSE: 97 mg/dL (ref 65–99)
Phosphorus: 9.9 mg/dL — ABNORMAL HIGH (ref 2.5–4.6)
Potassium: 4.3 mmol/L (ref 3.5–5.1)
Sodium: 133 mmol/L — ABNORMAL LOW (ref 135–145)

## 2015-03-15 LAB — CBC
HEMATOCRIT: 24.6 % — AB (ref 36.0–46.0)
Hemoglobin: 7.9 g/dL — ABNORMAL LOW (ref 12.0–15.0)
MCH: 27.9 pg (ref 26.0–34.0)
MCHC: 32.1 g/dL (ref 30.0–36.0)
MCV: 86.9 fL (ref 78.0–100.0)
PLATELETS: 189 10*3/uL (ref 150–400)
RBC: 2.83 MIL/uL — ABNORMAL LOW (ref 3.87–5.11)
RDW: 17.2 % — AB (ref 11.5–15.5)
WBC: 8 10*3/uL (ref 4.0–10.5)

## 2015-03-15 MED ORDER — SODIUM CHLORIDE 0.9 % IV SOLN: 100.0000 mL | INTRAVENOUS | Status: DC | PRN

## 2015-03-15 MED ORDER — PENTAFLUOROPROP-TETRAFLUOROETH EX AERO: 1.0000 "application " | INHALATION_SPRAY | CUTANEOUS | Status: DC | PRN

## 2015-03-15 MED ORDER — LIDOCAINE-PRILOCAINE 2.5-2.5 % EX CREA: 1.0000 "application " | TOPICAL_CREAM | CUTANEOUS | Status: DC | PRN

## 2015-03-15 MED ORDER — ALTEPLASE 2 MG IJ SOLR
2.0000 mg | Freq: Once | INTRAMUSCULAR | Status: DC | PRN
  Filled 2015-03-15: qty 2

## 2015-03-15 MED ORDER — HEPARIN SODIUM (PORCINE) 1000 UNIT/ML DIALYSIS
1000.0000 [IU] | INTRAMUSCULAR | Status: DC | PRN
  Filled 2015-03-15: qty 1

## 2015-03-15 MED ORDER — DARBEPOETIN ALFA 200 MCG/0.4ML IJ SOSY
PREFILLED_SYRINGE | INTRAMUSCULAR | Status: AC
  Administered 2015-03-15: 200 ug via INTRAVENOUS
  Filled 2015-03-15: qty 0.4

## 2015-03-15 MED ORDER — SODIUM CHLORIDE 0.9 % IV SOLN
125.0000 mg | Freq: Once | INTRAVENOUS | Status: AC
  Administered 2015-03-15: 125 mg via INTRAVENOUS
  Filled 2015-03-15: qty 10

## 2015-03-15 MED ORDER — PREDNISONE 20 MG PO TABS
40.0000 mg | ORAL_TABLET | Freq: Once | ORAL | Status: AC
  Administered 2015-03-15: 40 mg via ORAL
  Filled 2015-03-15: qty 2

## 2015-03-15 MED ORDER — LIDOCAINE HCL (PF) 1 % IJ SOLN: 5.0000 mL | INTRAMUSCULAR | Status: DC | PRN

## 2015-03-15 MED ORDER — ACETAMINOPHEN 325 MG PO TABS
650.0000 mg | ORAL_TABLET | Freq: Once | ORAL | Status: AC
  Administered 2015-03-15: 650 mg via ORAL
  Filled 2015-03-15: qty 2

## 2015-03-15 MED ORDER — DARBEPOETIN ALFA 200 MCG/0.4ML IJ SOSY
200.0000 ug | PREFILLED_SYRINGE | Freq: Once | INTRAMUSCULAR | Status: AC
Start: 2015-03-15 — End: 2015-03-15
  Administered 2015-03-15: 200 ug via INTRAVENOUS
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
## 2015-03-15 NOTE — ED Notes (Signed)
Pt eating breakfast at this time, HD aware.  Pt to be sent to HD when finished.

## 2015-03-15 NOTE — Telephone Encounter (Signed)
Patient needs a refill on predniSONE (DELTASONE) 10 MG tablet and traMADol (ULTRAM) 50 MG tablet. Please send the [facility] to [facility] [address]. Thank you, Fonda Kinder, ASA

## 2015-03-15 NOTE — ED Notes (Signed)
Transferred to hemodialysis via stretcher. Finished eating.

## 2015-03-15 NOTE — ED Notes (Signed)
Breakfast tray given. °

## 2015-03-15 NOTE — ED Notes (Signed)
Patient here for dialysis - last appointment was Tuesday. Patient also c/o L hip pain d/t her arthritis, which she states she was just diagnosed with. Patient reports she was given prescription for prednisone for the arthritis but was unable to get it filled. A&O x 4, NAD.

## 2015-03-15 NOTE — ED Notes (Signed)
Pt transported to HD.

## 2015-03-16 MED ORDER — TRAMADOL HCL 50 MG PO TABS: 50.0000 mg | ORAL_TABLET | Freq: Three times a day (TID) | ORAL | Status: DC | PRN

## 2015-03-16 MED ORDER — PREDNISONE 10 MG PO TABS: ORAL_TABLET | ORAL | Status: DC

## 2015-03-16 NOTE — Telephone Encounter (Signed)
Patient called again requesting Prednisone refill for her arthritis.  She stated she has an appointment on Monday 03/19/15 with Rheumatology.  The Rheumatologist told her to keep taking the Prednisone until she has an office visit with them.  Patient should be taking Prednisone 30 mg once daily with a meal.  She will be able to get a dose tomorrow at dialysis but need a dose for today and Sunday.  Patient is aware that the Tramadol 50 mg Rx was called in with no refills.  Patient stated she really need the Prednisone more than the Tramadol.  She is requesting that Dr. Dallas Schimke give her a call today ASAP at [phone].  She can be reached at that number today.  Derl Barrow, RN

## 2015-03-16 NOTE — Telephone Encounter (Signed)
2nd request.  Bernard, Aiyla Baucom L, RN  

## 2015-03-16 NOTE — Telephone Encounter (Signed)
Spoke with Sara [name] [phone]), who states her appointment with Rheumatology is on 12/12. Will refill Prednisone 30mg  daily for 4 days until she is seen by rheumatology on 12/12.

## 2015-03-16 NOTE — Telephone Encounter (Signed)
Attempted to call Ms. Felling to discuss her med refill request. Unable to get in contact with primary number. Called secondary number which is a friend's number who states that patient broke her phone yesterday. States that she will try to get in contact with patient and have her call back.   Phoned in Tramadol 50mg  q 8 hrs PRN for moderate/severe pain #40 with NO refills.   Patient did not call back. If patient calls the clinic please I will let staff know to inform her that I sent Tramadol to pharmacy. I did not refill Prednisone as this is not a medication i feel comfortable refilling for her symptoms. Patient was told to be seen by Rhuematology as I think her symptoms are due to her lupus diagnosis. She had an appointment on 03/08/15. I am not sure if she went to this visit.

## 2015-03-16 NOTE — Telephone Encounter (Signed)
Pt is calling again and wanting to know when her prescriptions will be filled. She is waiting on her Tramadol and Prednisone. jw

## 2015-03-16 NOTE — Addendum Note (Signed)
Addended by: Smiley Houseman on: 03/16/2015 04:08 PM   Modules accepted: Orders

## 2015-03-17 ENCOUNTER — Encounter (HOSPITAL_COMMUNITY): Payer: Self-pay

## 2015-03-17 ENCOUNTER — Non-Acute Institutional Stay (HOSPITAL_COMMUNITY)
Admission: EM | Admit: 2015-03-17 | Discharge: 2015-03-17 | Disposition: A | Discharge status: Home / Self Care | Payer: Medicare Other | Attending: Nephrology | Admitting: Nephrology

## 2015-03-17 DIAGNOSIS — F319 Bipolar disorder, unspecified: Secondary | ICD-10-CM | POA: Insufficient documentation

## 2015-03-17 DIAGNOSIS — F17210 Nicotine dependence, cigarettes, uncomplicated: Secondary | ICD-10-CM | POA: Insufficient documentation

## 2015-03-17 DIAGNOSIS — N186 End stage renal disease: Secondary | ICD-10-CM | POA: Diagnosis not present

## 2015-03-17 DIAGNOSIS — I5022 Chronic systolic (congestive) heart failure: Secondary | ICD-10-CM | POA: Diagnosis not present

## 2015-03-17 DIAGNOSIS — Z79899 Other long term (current) drug therapy: Secondary | ICD-10-CM | POA: Diagnosis not present

## 2015-03-17 DIAGNOSIS — F209 Schizophrenia, unspecified: Secondary | ICD-10-CM | POA: Diagnosis not present

## 2015-03-17 DIAGNOSIS — M3214 Glomerular disease in systemic lupus erythematosus: Secondary | ICD-10-CM | POA: Diagnosis not present

## 2015-03-17 DIAGNOSIS — I132 Hypertensive heart and chronic kidney disease with heart failure and with stage 5 chronic kidney disease, or end stage renal disease: Secondary | ICD-10-CM | POA: Diagnosis not present

## 2015-03-17 DIAGNOSIS — D539 Nutritional anemia, unspecified: Secondary | ICD-10-CM | POA: Insufficient documentation

## 2015-03-17 DIAGNOSIS — Z7952 Long term (current) use of systemic steroids: Secondary | ICD-10-CM | POA: Insufficient documentation

## 2015-03-17 DIAGNOSIS — N19 Unspecified kidney failure: Secondary | ICD-10-CM | POA: Diagnosis present

## 2015-03-17 DIAGNOSIS — M25552 Pain in left hip: Secondary | ICD-10-CM | POA: Diagnosis not present

## 2015-03-17 DIAGNOSIS — Z992 Dependence on renal dialysis: Secondary | ICD-10-CM | POA: Insufficient documentation

## 2015-03-17 DIAGNOSIS — I120 Hypertensive chronic kidney disease with stage 5 chronic kidney disease or end stage renal disease: Secondary | ICD-10-CM | POA: Diagnosis not present

## 2015-03-17 LAB — CBC
HCT: 27.3 % — ABNORMAL LOW (ref 36.0–46.0)
HEMOGLOBIN: 8.5 g/dL — AB (ref 12.0–15.0)
MCH: 27.3 pg (ref 26.0–34.0)
MCHC: 31.1 g/dL (ref 30.0–36.0)
MCV: 87.8 fL (ref 78.0–100.0)
Platelets: 264 10*3/uL (ref 150–400)
RBC: 3.11 MIL/uL — ABNORMAL LOW (ref 3.87–5.11)
RDW: 17.3 % — AB (ref 11.5–15.5)
WBC: 7.7 10*3/uL (ref 4.0–10.5)

## 2015-03-17 LAB — BASIC METABOLIC PANEL
ANION GAP: 16 — AB (ref 5–15)
BUN: 64 mg/dL — AB (ref 6–20)
CALCIUM: 9 mg/dL (ref 8.9–10.3)
CO2: 23 mmol/L (ref 22–32)
CREATININE: 9.77 mg/dL — AB (ref 0.44–1.00)
Chloride: 95 mmol/L — ABNORMAL LOW (ref 101–111)
GFR calc Af Amer: 5 mL/min — ABNORMAL LOW (ref >60)
GFR calc non Af Amer: 5 mL/min — ABNORMAL LOW (ref >60)
GLUCOSE: 110 mg/dL — AB (ref 65–99)
Potassium: 4.3 mmol/L (ref 3.5–5.1)
Sodium: 134 mmol/L — ABNORMAL LOW (ref 135–145)

## 2015-03-17 MED ORDER — PENTAFLUOROPROP-TETRAFLUOROETH EX AERO: 1.0000 "application " | INHALATION_SPRAY | CUTANEOUS | Status: DC | PRN

## 2015-03-17 MED ORDER — DIPHENHYDRAMINE HCL 25 MG PO CAPS
25.0000 mg | ORAL_CAPSULE | Freq: Four times a day (QID) | ORAL | Status: DC | PRN
  Administered 2015-03-17: 25 mg via ORAL

## 2015-03-17 MED ORDER — ACETAMINOPHEN 325 MG PO TABS
ORAL_TABLET | ORAL | Status: AC
  Administered 2015-03-17: 650 mg via ORAL
  Filled 2015-03-17: qty 2

## 2015-03-17 MED ORDER — LIDOCAINE-PRILOCAINE 2.5-2.5 % EX CREA: 1.0000 "application " | TOPICAL_CREAM | CUTANEOUS | Status: DC | PRN

## 2015-03-17 MED ORDER — SODIUM CHLORIDE 0.9 % IV SOLN: 100.0000 mL | INTRAVENOUS | Status: DC | PRN

## 2015-03-17 MED ORDER — LIDOCAINE HCL (PF) 1 % IJ SOLN: 5.0000 mL | INTRAMUSCULAR | Status: DC | PRN

## 2015-03-17 MED ORDER — DIPHENHYDRAMINE HCL 25 MG PO CAPS
ORAL_CAPSULE | ORAL | Status: AC
  Filled 2015-03-17: qty 1

## 2015-03-17 MED ORDER — ACETAMINOPHEN 325 MG PO TABS
650.0000 mg | ORAL_TABLET | Freq: Once | ORAL | Status: AC
  Administered 2015-03-17: 650 mg via ORAL

## 2015-03-17 MED ORDER — HEPARIN SODIUM (PORCINE) 1000 UNIT/ML DIALYSIS: 1000.0000 [IU] | INTRAMUSCULAR | Status: DC | PRN

## 2015-03-17 MED ORDER — ALTEPLASE 2 MG IJ SOLR: 2.0000 mg | Freq: Once | INTRAMUSCULAR | Status: DC | PRN

## 2015-03-17 MED ORDER — HEPARIN SODIUM (PORCINE) 1000 UNIT/ML DIALYSIS: 4000.0000 [IU] | Freq: Once | INTRAMUSCULAR | Status: DC

## 2015-03-17 NOTE — ED Notes (Signed)
Pt stated "please let the dr know about my hip pain"

## 2015-03-17 NOTE — Procedures (Signed)
Labs returned with Hb 8.5, K 4.3, Ca 9.0.  WBC/plts ok.  Patient no new complaints.  Dry weight prob around 56-57kg.  HD today.  Patient remains w/o an OP HD unit.     I was present at this dialysis session, have reviewed the session itself and made  appropriate changes Kelly Splinter MD [place] pager [phone]    cell [phone] 03/17/2015, 12:58 PM
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
## 2015-03-17 NOTE — ED Notes (Addendum)
Pt presents to the ED for her dialysis. Pt is Tues/Thurs/Saturday and has already contacted Dialysis. Pt also complains of left hip pain and says "my arthritis is acting up"

## 2015-03-17 NOTE — Progress Notes (Signed)
Patient completed 3 hours and 10 minutes of a 3.5 hour treatment. Pt signed off with 20 minutes remaining with c/o cramps. During treatment, patient became verbally confrontational but calmed without further intervention. MD notified of confrontational behavior. At end of treatment, patient calm and cooperative. Discharged to self care after treatment.

## 2015-03-17 NOTE — ED Notes (Signed)
Pt constantly calling out to place her breakfast order. Nurse notified.

## 2015-03-17 NOTE — ED Notes (Addendum)
Informed pt that a renal breakfast was ordered for her, she was unhappy b/c she did not want a "renal meal."  Pt was also informed that dialysis stated they were busy this morning and that it would be later this morning before they would be able to get to her.

## 2015-03-20 ENCOUNTER — Encounter (HOSPITAL_COMMUNITY): Payer: Self-pay

## 2015-03-20 ENCOUNTER — Emergency Department (HOSPITAL_COMMUNITY)
Admission: EM | Admit: 2015-03-20 | Discharge: 2015-03-20 | Discharge status: Against Medical Advice | Payer: Medicare Other | Source: Home / Self Care | Attending: Emergency Medicine | Admitting: Emergency Medicine

## 2015-03-20 ENCOUNTER — Non-Acute Institutional Stay (HOSPITAL_COMMUNITY)
Admission: EM | Admit: 2015-03-20 | Discharge: 2015-03-20 | Disposition: A | Discharge status: Home / Self Care | Payer: Medicare Other | Attending: Emergency Medicine | Admitting: Emergency Medicine

## 2015-03-20 DIAGNOSIS — M25552 Pain in left hip: Secondary | ICD-10-CM | POA: Diagnosis not present

## 2015-03-20 DIAGNOSIS — F17210 Nicotine dependence, cigarettes, uncomplicated: Secondary | ICD-10-CM | POA: Insufficient documentation

## 2015-03-20 DIAGNOSIS — D539 Nutritional anemia, unspecified: Secondary | ICD-10-CM | POA: Diagnosis not present

## 2015-03-20 DIAGNOSIS — F209 Schizophrenia, unspecified: Secondary | ICD-10-CM | POA: Insufficient documentation

## 2015-03-20 DIAGNOSIS — I120 Hypertensive chronic kidney disease with stage 5 chronic kidney disease or end stage renal disease: Secondary | ICD-10-CM | POA: Diagnosis not present

## 2015-03-20 DIAGNOSIS — Z79899 Other long term (current) drug therapy: Secondary | ICD-10-CM | POA: Insufficient documentation

## 2015-03-20 DIAGNOSIS — N186 End stage renal disease: Secondary | ICD-10-CM | POA: Diagnosis not present

## 2015-03-20 DIAGNOSIS — Z992 Dependence on renal dialysis: Secondary | ICD-10-CM | POA: Diagnosis not present

## 2015-03-20 DIAGNOSIS — M3214 Glomerular disease in systemic lupus erythematosus: Secondary | ICD-10-CM | POA: Insufficient documentation

## 2015-03-20 DIAGNOSIS — I132 Hypertensive heart and chronic kidney disease with heart failure and with stage 5 chronic kidney disease, or end stage renal disease: Secondary | ICD-10-CM | POA: Insufficient documentation

## 2015-03-20 DIAGNOSIS — F319 Bipolar disorder, unspecified: Secondary | ICD-10-CM | POA: Insufficient documentation

## 2015-03-20 DIAGNOSIS — I5022 Chronic systolic (congestive) heart failure: Secondary | ICD-10-CM | POA: Insufficient documentation

## 2015-03-20 DIAGNOSIS — M1612 Unilateral primary osteoarthritis, left hip: Secondary | ICD-10-CM | POA: Diagnosis not present

## 2015-03-20 LAB — RENAL FUNCTION PANEL
ANION GAP: 18 — AB (ref 5–15)
Albumin: 3.3 g/dL — ABNORMAL LOW (ref 3.5–5.0)
BUN: 76 mg/dL — ABNORMAL HIGH (ref 6–20)
CO2: 21 mmol/L — AB (ref 22–32)
Calcium: 9.5 mg/dL (ref 8.9–10.3)
Chloride: 95 mmol/L — ABNORMAL LOW (ref 101–111)
Creatinine, Ser: 10.87 mg/dL — ABNORMAL HIGH (ref 0.44–1.00)
GFR calc Af Amer: 5 mL/min — ABNORMAL LOW (ref >60)
GFR calc non Af Amer: 4 mL/min — ABNORMAL LOW (ref >60)
GLUCOSE: 77 mg/dL (ref 65–99)
PHOSPHORUS: 9 mg/dL — AB (ref 2.5–4.6)
POTASSIUM: 4.2 mmol/L (ref 3.5–5.1)
Sodium: 134 mmol/L — ABNORMAL LOW (ref 135–145)

## 2015-03-20 LAB — CBC
HEMATOCRIT: 30.2 % — AB (ref 36.0–46.0)
HEMOGLOBIN: 9.4 g/dL — AB (ref 12.0–15.0)
MCH: 27.7 pg (ref 26.0–34.0)
MCHC: 31.1 g/dL (ref 30.0–36.0)
MCV: 89.1 fL (ref 78.0–100.0)
Platelets: 301 10*3/uL (ref 150–400)
RBC: 3.39 MIL/uL — ABNORMAL LOW (ref 3.87–5.11)
RDW: 18.9 % — ABNORMAL HIGH (ref 11.5–15.5)
WBC: 7.8 10*3/uL (ref 4.0–10.5)

## 2015-03-20 MED ORDER — DOXERCALCIFEROL 4 MCG/2ML IV SOLN
INTRAVENOUS | Status: AC
  Filled 2015-03-20: qty 4

## 2015-03-20 MED ORDER — PENTAFLUOROPROP-TETRAFLUOROETH EX AERO
1.0000 "application " | INHALATION_SPRAY | CUTANEOUS | Status: DC | PRN
  Filled 2015-03-20: qty 30

## 2015-03-20 MED ORDER — HEPARIN SODIUM (PORCINE) 1000 UNIT/ML DIALYSIS
1000.0000 [IU] | INTRAMUSCULAR | Status: DC | PRN
  Filled 2015-03-20: qty 1

## 2015-03-20 MED ORDER — SODIUM CHLORIDE 0.9 % IV SOLN: 100.0000 mL | INTRAVENOUS | Status: DC | PRN

## 2015-03-20 MED ORDER — PREDNISONE 20 MG PO TABS
30.0000 mg | ORAL_TABLET | ORAL | Status: AC
  Filled 2015-03-20: qty 1.5

## 2015-03-20 MED ORDER — OXYCODONE-ACETAMINOPHEN 5-325 MG PO TABS
ORAL_TABLET | ORAL | Status: AC
  Filled 2015-03-20: qty 2

## 2015-03-20 MED ORDER — PREDNISONE 20 MG PO TABS
30.0000 mg | ORAL_TABLET | Freq: Every day | ORAL | Status: DC
  Administered 2015-03-20: 30 mg via ORAL

## 2015-03-20 MED ORDER — HEPARIN SODIUM (PORCINE) 1000 UNIT/ML DIALYSIS
20.0000 [IU]/kg | INTRAMUSCULAR | Status: DC | PRN
  Filled 2015-03-20: qty 2

## 2015-03-20 MED ORDER — DIPHENHYDRAMINE HCL 25 MG PO CAPS
25.0000 mg | ORAL_CAPSULE | Freq: Once | ORAL | Status: AC
  Administered 2015-03-20: 50 mg via ORAL

## 2015-03-20 MED ORDER — OXYCODONE-ACETAMINOPHEN 5-325 MG PO TABS
1.0000 | ORAL_TABLET | Freq: Once | ORAL | Status: AC
  Administered 2015-03-20: 2 via ORAL

## 2015-03-20 MED ORDER — LIDOCAINE HCL (PF) 1 % IJ SOLN: 5.0000 mL | INTRAMUSCULAR | Status: DC | PRN

## 2015-03-20 MED ORDER — SODIUM CHLORIDE 0.9 % IV SOLN
250.0000 mg | INTRAVENOUS | Status: DC
  Filled 2015-03-20: qty 20

## 2015-03-20 MED ORDER — ALTEPLASE 2 MG IJ SOLR
2.0000 mg | Freq: Once | INTRAMUSCULAR | Status: DC | PRN
  Filled 2015-03-20: qty 2

## 2015-03-20 MED ORDER — DIPHENHYDRAMINE HCL 25 MG PO CAPS
ORAL_CAPSULE | ORAL | Status: AC
  Filled 2015-03-20: qty 2

## 2015-03-20 MED ORDER — DARBEPOETIN ALFA 100 MCG/0.5ML IJ SOSY
100.0000 ug | PREFILLED_SYRINGE | Freq: Once | INTRAMUSCULAR | Status: DC
  Filled 2015-03-20: qty 0.5

## 2015-03-20 MED ORDER — LIDOCAINE-PRILOCAINE 2.5-2.5 % EX CREA
1.0000 "application " | TOPICAL_CREAM | CUTANEOUS | Status: DC | PRN
  Filled 2015-03-20: qty 5
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
## 2015-03-20 NOTE — Procedures (Signed)
Patient here for dialysis, she still has no OP HD unit.  She is manic w flight of ideas and cursing.  Not sure she will make it through this session, starting to curse and getting loud. Saw her FP MD 11/14 given pred 40 / d for 5 days for hip pain poss from SLE.  Made appt w rheum MD but pt says she missed it yesterday cause she was moving into the homeless shelter.  She asked if she could have a kidney transplant.  I told her someday hopefully but that she would need to go through [facility] or [facility], she said OK.  She is 3-4kg over our estimated dry wt of 57.5kg. Last Hb was 8.5 on 12/10.   Last Darbe 12/8 200ug and 11/27 got 100.  Last tfs was 20% on 11/27 and last ferric gluc 125 mg on 12/8.    Will give darbe 100 ug and ferric gluc 250 mg today.     I was present at this dialysis session, have reviewed the session itself and made  appropriate changes Kelly Splinter MD [place] pager [phone]    cell [phone] 03/20/2015, 9:11 AM

## 2015-03-20 NOTE — Progress Notes (Signed)
Patient cursing and combative with staff. Patient demanded to be taken off HD. Patient has only received 2 hours of her prescribed 4 hour treatment. Emergency department has been called to inform that the patient will be returning. ED staff has informed us that they cannot force the patient to stay to be evaluated,unless requested by the patient. Security has been called to escort patient to the ED. Dr Jonnie Finner has been made aware.

## 2015-03-20 NOTE — ED Notes (Signed)
Pt here for dialysis treatment. 

## 2015-03-20 NOTE — ED Notes (Signed)
BREAKFAST ORDERED  

## 2015-03-21 ENCOUNTER — Emergency Department (HOSPITAL_COMMUNITY)
Admission: EM | Admit: 2015-03-21 | Discharge: 2015-03-21 | Disposition: A | Discharge status: Home / Self Care | Payer: Medicare Other | Attending: Emergency Medicine | Admitting: Emergency Medicine

## 2015-03-21 ENCOUNTER — Other Ambulatory Visit: Payer: Self-pay | Admitting: Internal Medicine

## 2015-03-21 ENCOUNTER — Encounter (HOSPITAL_COMMUNITY): Payer: Self-pay | Admitting: Emergency Medicine

## 2015-03-21 DIAGNOSIS — I509 Heart failure, unspecified: Secondary | ICD-10-CM | POA: Insufficient documentation

## 2015-03-21 DIAGNOSIS — Z9889 Other specified postprocedural states: Secondary | ICD-10-CM | POA: Diagnosis not present

## 2015-03-21 DIAGNOSIS — Y9289 Other specified places as the place of occurrence of the external cause: Secondary | ICD-10-CM | POA: Diagnosis not present

## 2015-03-21 DIAGNOSIS — Z7952 Long term (current) use of systemic steroids: Secondary | ICD-10-CM | POA: Diagnosis not present

## 2015-03-21 DIAGNOSIS — F17210 Nicotine dependence, cigarettes, uncomplicated: Secondary | ICD-10-CM | POA: Diagnosis not present

## 2015-03-21 DIAGNOSIS — J029 Acute pharyngitis, unspecified: Secondary | ICD-10-CM | POA: Insufficient documentation

## 2015-03-21 DIAGNOSIS — N186 End stage renal disease: Secondary | ICD-10-CM | POA: Insufficient documentation

## 2015-03-21 DIAGNOSIS — Y9389 Activity, other specified: Secondary | ICD-10-CM | POA: Insufficient documentation

## 2015-03-21 DIAGNOSIS — Y998 Other external cause status: Secondary | ICD-10-CM | POA: Insufficient documentation

## 2015-03-21 DIAGNOSIS — M2550 Pain in unspecified joint: Secondary | ICD-10-CM

## 2015-03-21 DIAGNOSIS — Z862 Personal history of diseases of the blood and blood-forming organs and certain disorders involving the immune mechanism: Secondary | ICD-10-CM | POA: Diagnosis not present

## 2015-03-21 DIAGNOSIS — S79912A Unspecified injury of left hip, initial encounter: Secondary | ICD-10-CM | POA: Diagnosis not present

## 2015-03-21 DIAGNOSIS — R21 Rash and other nonspecific skin eruption: Secondary | ICD-10-CM | POA: Diagnosis not present

## 2015-03-21 DIAGNOSIS — Z8659 Personal history of other mental and behavioral disorders: Secondary | ICD-10-CM | POA: Diagnosis not present

## 2015-03-21 DIAGNOSIS — Z79899 Other long term (current) drug therapy: Secondary | ICD-10-CM | POA: Diagnosis not present

## 2015-03-21 DIAGNOSIS — I120 Hypertensive chronic kidney disease with stage 5 chronic kidney disease or end stage renal disease: Secondary | ICD-10-CM | POA: Insufficient documentation

## 2015-03-21 DIAGNOSIS — M25552 Pain in left hip: Secondary | ICD-10-CM | POA: Diagnosis not present

## 2015-03-21 MED ORDER — PREDNISONE 10 MG PO TABS
ORAL_TABLET | ORAL | Status: DC
Start: 2015-03-21 — End: 2015-04-15

## 2015-03-21 MED ORDER — PREDNISONE 20 MG PO TABS
30.0000 mg | ORAL_TABLET | Freq: Once | ORAL | Status: AC
  Administered 2015-03-21: 30 mg via ORAL
  Filled 2015-03-21: qty 2

## 2015-03-21 NOTE — Telephone Encounter (Signed)
Pt called very upset that she has to call everyday for her Prednisone. She takes 30 mg 3 times a day and she needs 30 day supply not 12 pills for 4 days. She wants this today and should not have to keep calling for this. Please call or send this in for the patient. jw
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
## 2015-03-21 NOTE — Discharge Instructions (Signed)
Joint Pain °Joint pain, which is also called arthralgia, can be caused by many things. Joint pain often goes away when you follow your health care provider's instructions for relieving pain at home. However, joint pain can also be caused by conditions that require further treatment. Common causes of joint pain include: °· Bruising in the area of the joint. °· Overuse of the joint. °· Wear and tear on the joints that occur with aging (osteoarthritis). °· Various other forms of arthritis. °· A buildup of a crystal form of uric acid in the joint (gout). °· Infections of the joint (septic arthritis) or of the bone (osteomyelitis). °Your health care provider may recommend medicine to help with the pain. If your joint pain continues, additional tests may be needed to diagnose your condition. °HOME CARE INSTRUCTIONS °Watch your condition for any changes. Follow these instructions as directed to lessen the pain that you are feeling. °· Take medicines only as directed by your health care provider. °· Rest the affected area for as long as your health care provider says that you should. If directed to do so, raise the painful joint above the level of your heart while you are sitting or lying down. °· Do not do things that cause or worsen pain. °· If directed, apply ice to the painful area: °¨ Put ice in a plastic bag. °¨ Place a towel between your skin and the bag. °¨ Leave the ice on for 20 minutes, 2-3 times per day. °· Wear an elastic bandage, splint, or sling as directed by your health care provider. Loosen the elastic bandage or splint if your fingers or toes become numb and tingle, or if they turn cold and blue. °· Begin exercising or stretching the affected area as directed by your health care provider. Ask your health care provider what types of exercise are safe for you. °· Keep all follow-up visits as directed by your health care provider. This is important. °SEEK MEDICAL CARE IF: °· Your pain increases, and medicine  does not help. °· Your joint pain does not improve within 3 days. °· You have increased bruising or swelling. °· You have a fever. °· You lose 10 lb (4.5 kg) or more without trying. °SEEK IMMEDIATE MEDICAL CARE IF: °· You are not able to move the joint. °· Your fingers or toes become numb or they turn cold and blue. °  °This information is not intended to replace advice given to you by your health care provider. Make sure you discuss any questions you have with your health care provider. °  °Document Released: 03/24/2005 Document Revised: 04/14/2014 Document Reviewed: 01/03/2014 °Elsevier Interactive Patient Education ©2016 [employer] ° °

## 2015-03-21 NOTE — ED Notes (Signed)
Pt stated she had dialysis on Tueday

## 2015-03-21 NOTE — Telephone Encounter (Signed)
I will touch base with Dr. Mingo Amber on how to proceed and will get back to the blue team.  Thank you.
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
## 2015-03-21 NOTE — Telephone Encounter (Signed)
Need refill on presidone (for arthritis). Is in a lot of pain now.  Had it yesterday at dialysis Please call pt when it is called in  [facility] [address]

## 2015-03-21 NOTE — Telephone Encounter (Signed)
Pt called back to check the status of her request for a 30 day supply of Prednisone. I explained that we have to wait for the doctor to approve this. She said that she has arthritis and she has standing orders from the ER that say she can have this medication every time she request this. She is very mad and doesn't understand why we are not calling this in. I said that we have sent a note to the doctor and have to wait for her approval. She said she is coming up here to get her medication and also for me to go blank myself. jw

## 2015-03-21 NOTE — ED Notes (Signed)
Called for pt to be brought back to triage, with no response. Secretary states that she saw pt taking off down the hall unsure of where pt was going moments ago. Pt has not returned since.

## 2015-03-22 ENCOUNTER — Encounter (HOSPITAL_COMMUNITY): Payer: Self-pay | Admitting: Emergency Medicine

## 2015-03-22 ENCOUNTER — Non-Acute Institutional Stay (HOSPITAL_COMMUNITY)
Admission: EM | Admit: 2015-03-22 | Discharge: 2015-03-22 | Disposition: A | Discharge status: Home / Self Care | Payer: Medicare Other | Attending: Emergency Medicine | Admitting: Emergency Medicine

## 2015-03-22 ENCOUNTER — Ambulatory Visit: Payer: Self-pay | Admitting: Student

## 2015-03-22 DIAGNOSIS — Z992 Dependence on renal dialysis: Secondary | ICD-10-CM | POA: Insufficient documentation

## 2015-03-22 DIAGNOSIS — F419 Anxiety disorder, unspecified: Secondary | ICD-10-CM | POA: Insufficient documentation

## 2015-03-22 DIAGNOSIS — Z4931 Encounter for adequacy testing for hemodialysis: Secondary | ICD-10-CM | POA: Diagnosis not present

## 2015-03-22 DIAGNOSIS — I132 Hypertensive heart and chronic kidney disease with heart failure and with stage 5 chronic kidney disease, or end stage renal disease: Secondary | ICD-10-CM | POA: Insufficient documentation

## 2015-03-22 DIAGNOSIS — F319 Bipolar disorder, unspecified: Secondary | ICD-10-CM | POA: Diagnosis not present

## 2015-03-22 DIAGNOSIS — N186 End stage renal disease: Secondary | ICD-10-CM | POA: Insufficient documentation

## 2015-03-22 DIAGNOSIS — D649 Anemia, unspecified: Secondary | ICD-10-CM | POA: Insufficient documentation

## 2015-03-22 DIAGNOSIS — F209 Schizophrenia, unspecified: Secondary | ICD-10-CM | POA: Diagnosis not present

## 2015-03-22 DIAGNOSIS — Z79899 Other long term (current) drug therapy: Secondary | ICD-10-CM | POA: Insufficient documentation

## 2015-03-22 DIAGNOSIS — M3214 Glomerular disease in systemic lupus erythematosus: Secondary | ICD-10-CM | POA: Insufficient documentation

## 2015-03-22 DIAGNOSIS — I5022 Chronic systolic (congestive) heart failure: Secondary | ICD-10-CM | POA: Insufficient documentation

## 2015-03-22 DIAGNOSIS — F17210 Nicotine dependence, cigarettes, uncomplicated: Secondary | ICD-10-CM | POA: Diagnosis not present

## 2015-03-22 LAB — RENAL FUNCTION PANEL
ALBUMIN: 3.1 g/dL — AB (ref 3.5–5.0)
Anion gap: 18 — ABNORMAL HIGH (ref 5–15)
BUN: 89 mg/dL — ABNORMAL HIGH (ref 6–20)
CALCIUM: 9.6 mg/dL (ref 8.9–10.3)
CO2: 23 mmol/L (ref 22–32)
CREATININE: 12.59 mg/dL — AB (ref 0.44–1.00)
Chloride: 99 mmol/L — ABNORMAL LOW (ref 101–111)
GFR, EST AFRICAN AMERICAN: 4 mL/min — AB (ref >60)
GFR, EST NON AFRICAN AMERICAN: 3 mL/min — AB (ref >60)
Glucose, Bld: 124 mg/dL — ABNORMAL HIGH (ref 65–99)
Phosphorus: 10.9 mg/dL — ABNORMAL HIGH (ref 2.5–4.6)
Potassium: 4.7 mmol/L (ref 3.5–5.1)
SODIUM: 140 mmol/L (ref 135–145)

## 2015-03-22 LAB — CBC
HCT: 26 % — ABNORMAL LOW (ref 36.0–46.0)
Hemoglobin: 8.2 g/dL — ABNORMAL LOW (ref 12.0–15.0)
MCH: 28.3 pg (ref 26.0–34.0)
MCHC: 31.5 g/dL (ref 30.0–36.0)
MCV: 89.7 fL (ref 78.0–100.0)
PLATELETS: 281 10*3/uL (ref 150–400)
RBC: 2.9 MIL/uL — AB (ref 3.87–5.11)
RDW: 19.5 % — ABNORMAL HIGH (ref 11.5–15.5)
WBC: 6.6 10*3/uL (ref 4.0–10.5)

## 2015-03-22 MED ORDER — DARBEPOETIN ALFA 200 MCG/0.4ML IJ SOSY
200.0000 ug | PREFILLED_SYRINGE | Freq: Once | INTRAMUSCULAR | Status: AC
  Administered 2015-03-22: 200 ug via INTRAVENOUS

## 2015-03-22 MED ORDER — SODIUM CHLORIDE 0.9 % IV SOLN: 100.0000 mL | INTRAVENOUS | Status: DC | PRN

## 2015-03-22 MED ORDER — LIDOCAINE-PRILOCAINE 2.5-2.5 % EX CREA: 1.0000 "application " | TOPICAL_CREAM | CUTANEOUS | Status: DC | PRN

## 2015-03-22 MED ORDER — ALPRAZOLAM 0.5 MG PO TABS
1.0000 mg | ORAL_TABLET | Freq: Once | ORAL | Status: AC
  Administered 2015-03-22: 1 mg via ORAL
  Filled 2015-03-22: qty 2

## 2015-03-22 MED ORDER — DARBEPOETIN ALFA 200 MCG/0.4ML IJ SOSY
PREFILLED_SYRINGE | INTRAMUSCULAR | Status: AC
  Filled 2015-03-22: qty 0.4

## 2015-03-22 MED ORDER — PENTAFLUOROPROP-TETRAFLUOROETH EX AERO: 1.0000 "application " | INHALATION_SPRAY | CUTANEOUS | Status: DC | PRN

## 2015-03-22 MED ORDER — SODIUM CHLORIDE 0.9 % IV SOLN: 125.0000 mg | Freq: Once | INTRAVENOUS | Status: DC

## 2015-03-22 MED ORDER — LIDOCAINE HCL (PF) 1 % IJ SOLN: 5.0000 mL | INTRAMUSCULAR | Status: DC | PRN

## 2015-03-22 MED ORDER — DIPHENHYDRAMINE HCL 25 MG PO CAPS
25.0000 mg | ORAL_CAPSULE | Freq: Once | ORAL | Status: AC
  Administered 2015-03-22: 25 mg via ORAL
  Filled 2015-03-22: qty 1

## 2015-03-22 NOTE — Telephone Encounter (Signed)
After discussing with Dr. Mingo Amber, we both believe that prescribing long term steroids has significant side effects and dangers. I do not want to cause any harm to the patient, therefore I unfortunately cannot prescribe a 30 day supply to the patient. She was to establish care with rheumatology as she has lupus and her symptoms a likely due to her lupus. When I spoke to patient last week she agreed to have a prednisone refilled for just a few days until she can make her appointment with rheumatology on 12/12. I will call patient today to inform her about this, but I wanted the blue team to know the plan in case patient calls the clinic before I am able to get in touch with her today. Thank you for your help.

## 2015-03-22 NOTE — ED Notes (Signed)
Per Hemo, have pt eat lunch before bringing for treatment.

## 2015-03-22 NOTE — ED Notes (Signed)
Lunch tray delivered.

## 2015-03-22 NOTE — Telephone Encounter (Signed)
Attempted to call Ms. Boring however was sent to voice mail. Will try again later this afternoon.

## 2015-03-22 NOTE — ED Notes (Signed)
Lunch tray ordered 

## 2015-03-22 NOTE — ED Notes (Signed)
CNA transporting pt on monitor, security at bedside to transport.  Pt sleeping, VSS.  HD RN aware pt is on the way.

## 2015-03-22 NOTE — Telephone Encounter (Signed)
Was able to speak with Sara [name] this afternoon. States she received a new Prednisone prescription by an ED physician on 12/14. States that she has an appointment in clinic but per chart review it was today at 2:30. Patient states she knows about this and she was not able to make it today because she is currently in dialysis in the ED. Patient states that she will reschedule the appointment with another doctor. Patient seemed to be upset that I did not refill her medication yesterday and abruptly hung up after saying "have a nice vacation" before I could ask her if she was able to see rheumatology and before I could explain to her my reasoning for not refilling the prescription.
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
## 2015-03-22 NOTE — ED Notes (Signed)
Pt sleeping at this time, alert to voice.  BP-144/95 O2-100% RA, R-16, P-82, HD aware.  Security notified to transport.

## 2015-03-22 NOTE — ED Notes (Signed)
Pt requesting a mood stabilizer, something for anxiety, and something for pain.  MD Ralene Bathe aware.

## 2015-03-22 NOTE — ED Notes (Signed)
Dialysis requesting Security escort pt to dialysis and sit with pt during tx, will notify to escort.

## 2015-03-22 NOTE — ED Notes (Signed)
Pt requesting psychiatrist resources, resource guide printed and reviewed with pt, pt verbalized understanding.

## 2015-03-22 NOTE — Progress Notes (Signed)
Pt terminated tx with 1.75hr left; pt manic and was mad because she thinks she was not given food in the ED but I waited for her to come up for dialysis so she cd eat. Pt was given food. Pt denies pain, dizziness, n/v. Pt walked off the unit by herself.

## 2015-03-22 NOTE — ED Notes (Signed)
Pt here for dialysis; pt seen here for regular dialysis ; pt sts some bumps on her face and back that are itching; pt sts scratching throat as well

## 2015-03-23 ENCOUNTER — Other Ambulatory Visit: Payer: Self-pay

## 2015-03-23 NOTE — Patient Outreach (Signed)
[facility]) Care Management  03/23/2015  Sara [name] 1980-07-01 [id number]   Telephone call to patient for [facility] ER High Utilizer Referral.  No answer. HIPAA compliant voice message left.    Plan: RN Health Coach will attempt patient within 1-2 weeks.  Jone Baseman, RN, MSN [place] [phone]

## 2015-03-25 ENCOUNTER — Encounter (HOSPITAL_COMMUNITY): Payer: Self-pay | Admitting: *Deleted

## 2015-03-25 ENCOUNTER — Non-Acute Institutional Stay (HOSPITAL_COMMUNITY)
Admission: EM | Admit: 2015-03-25 | Discharge: 2015-03-25 | Disposition: A | Discharge status: Home / Self Care | Payer: Medicare Other | Attending: Emergency Medicine | Admitting: Emergency Medicine

## 2015-03-25 DIAGNOSIS — I120 Hypertensive chronic kidney disease with stage 5 chronic kidney disease or end stage renal disease: Secondary | ICD-10-CM | POA: Insufficient documentation

## 2015-03-25 DIAGNOSIS — N186 End stage renal disease: Secondary | ICD-10-CM | POA: Insufficient documentation

## 2015-03-25 DIAGNOSIS — I5022 Chronic systolic (congestive) heart failure: Secondary | ICD-10-CM | POA: Insufficient documentation

## 2015-03-25 DIAGNOSIS — F17210 Nicotine dependence, cigarettes, uncomplicated: Secondary | ICD-10-CM | POA: Diagnosis not present

## 2015-03-25 DIAGNOSIS — R030 Elevated blood-pressure reading, without diagnosis of hypertension: Secondary | ICD-10-CM | POA: Diagnosis not present

## 2015-03-25 DIAGNOSIS — Z992 Dependence on renal dialysis: Secondary | ICD-10-CM | POA: Diagnosis not present

## 2015-03-25 DIAGNOSIS — N189 Chronic kidney disease, unspecified: Secondary | ICD-10-CM | POA: Diagnosis present

## 2015-03-25 MED ORDER — PENTAFLUOROPROP-TETRAFLUOROETH EX AERO: 1.0000 "application " | INHALATION_SPRAY | CUTANEOUS | Status: DC | PRN

## 2015-03-25 MED ORDER — ALPRAZOLAM 0.25 MG PO TABS
0.2500 mg | ORAL_TABLET | Freq: Once | ORAL | Status: AC
  Administered 2015-03-25: 0.25 mg via ORAL
  Filled 2015-03-25: qty 1

## 2015-03-25 MED ORDER — ALTEPLASE 2 MG IJ SOLR
2.0000 mg | Freq: Once | INTRAMUSCULAR | Status: DC | PRN
  Filled 2015-03-25: qty 2

## 2015-03-25 MED ORDER — DIPHENHYDRAMINE HCL 25 MG PO CAPS
25.0000 mg | ORAL_CAPSULE | Freq: Once | ORAL | Status: AC
  Administered 2015-03-25: 25 mg via ORAL
  Filled 2015-03-25: qty 1

## 2015-03-25 MED ORDER — HEPARIN SODIUM (PORCINE) 1000 UNIT/ML DIALYSIS
1000.0000 [IU] | INTRAMUSCULAR | Status: DC | PRN
  Filled 2015-03-25: qty 1

## 2015-03-25 MED ORDER — LIDOCAINE HCL (PF) 1 % IJ SOLN: 5.0000 mL | INTRAMUSCULAR | Status: DC | PRN

## 2015-03-25 MED ORDER — HEPARIN SODIUM (PORCINE) 1000 UNIT/ML DIALYSIS
5000.0000 [IU] | INTRAMUSCULAR | Status: DC | PRN
  Filled 2015-03-25: qty 5

## 2015-03-25 MED ORDER — LIDOCAINE-PRILOCAINE 2.5-2.5 % EX CREA: 1.0000 "application " | TOPICAL_CREAM | CUTANEOUS | Status: DC | PRN

## 2015-03-25 MED ORDER — SODIUM CHLORIDE 0.9 % IV SOLN: 100.0000 mL | INTRAVENOUS | Status: DC | PRN

## 2015-03-25 NOTE — ED Notes (Signed)
Patient presents needing dialysis

## 2015-03-25 NOTE — Progress Notes (Signed)
Hemodialysis- Pt completed full treatment today with 5.5L off. Discharged to self care. Pt left HD unit on foot. Vitals remained stable throughout.

## 2015-03-25 NOTE — ED Notes (Signed)
Report given to [facility] on HD, pt to be transfer to HD now.
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
## 2015-03-27 ENCOUNTER — Other Ambulatory Visit: Payer: Self-pay | Admitting: Nephrology

## 2015-03-27 ENCOUNTER — Encounter (HOSPITAL_COMMUNITY): Payer: Self-pay | Admitting: Emergency Medicine

## 2015-03-27 ENCOUNTER — Emergency Department (HOSPITAL_COMMUNITY)
Admission: EM | Admit: 2015-03-27 | Discharge: 2015-03-27 | Disposition: A | Discharge status: Home / Self Care | Payer: Medicare Other | Attending: Emergency Medicine | Admitting: Emergency Medicine

## 2015-03-27 DIAGNOSIS — Z8739 Personal history of other diseases of the musculoskeletal system and connective tissue: Secondary | ICD-10-CM | POA: Insufficient documentation

## 2015-03-27 DIAGNOSIS — Z4931 Encounter for adequacy testing for hemodialysis: Secondary | ICD-10-CM | POA: Diagnosis not present

## 2015-03-27 DIAGNOSIS — I509 Heart failure, unspecified: Secondary | ICD-10-CM | POA: Insufficient documentation

## 2015-03-27 DIAGNOSIS — Z8659 Personal history of other mental and behavioral disorders: Secondary | ICD-10-CM | POA: Insufficient documentation

## 2015-03-27 DIAGNOSIS — Z992 Dependence on renal dialysis: Secondary | ICD-10-CM

## 2015-03-27 DIAGNOSIS — Z79899 Other long term (current) drug therapy: Secondary | ICD-10-CM | POA: Diagnosis not present

## 2015-03-27 DIAGNOSIS — F17210 Nicotine dependence, cigarettes, uncomplicated: Secondary | ICD-10-CM | POA: Insufficient documentation

## 2015-03-27 DIAGNOSIS — Z7951 Long term (current) use of inhaled steroids: Secondary | ICD-10-CM | POA: Diagnosis not present

## 2015-03-27 DIAGNOSIS — N186 End stage renal disease: Secondary | ICD-10-CM | POA: Diagnosis not present

## 2015-03-27 DIAGNOSIS — I120 Hypertensive chronic kidney disease with stage 5 chronic kidney disease or end stage renal disease: Secondary | ICD-10-CM | POA: Insufficient documentation

## 2015-03-27 DIAGNOSIS — Z862 Personal history of diseases of the blood and blood-forming organs and certain disorders involving the immune mechanism: Secondary | ICD-10-CM | POA: Insufficient documentation

## 2015-03-27 DIAGNOSIS — Z95828 Presence of other vascular implants and grafts: Secondary | ICD-10-CM | POA: Insufficient documentation

## 2015-03-27 LAB — RENAL FUNCTION PANEL
ALBUMIN: 3.2 g/dL — AB (ref 3.5–5.0)
ANION GAP: 18 — AB (ref 5–15)
BUN: 78 mg/dL — ABNORMAL HIGH (ref 6–20)
CO2: 20 mmol/L — AB (ref 22–32)
Calcium: 9.2 mg/dL (ref 8.9–10.3)
Chloride: 100 mmol/L — ABNORMAL LOW (ref 101–111)
Creatinine, Ser: 11.19 mg/dL — ABNORMAL HIGH (ref 0.44–1.00)
GFR calc Af Amer: 5 mL/min — ABNORMAL LOW (ref >60)
GFR calc non Af Amer: 4 mL/min — ABNORMAL LOW (ref >60)
GLUCOSE: 131 mg/dL — AB (ref 65–99)
PHOSPHORUS: 9.8 mg/dL — AB (ref 2.5–4.6)
POTASSIUM: 5.1 mmol/L (ref 3.5–5.1)
Sodium: 138 mmol/L (ref 135–145)

## 2015-03-27 LAB — CBC
HEMATOCRIT: 29.6 % — AB (ref 36.0–46.0)
HEMOGLOBIN: 9.5 g/dL — AB (ref 12.0–15.0)
MCH: 29.5 pg (ref 26.0–34.0)
MCHC: 32.1 g/dL (ref 30.0–36.0)
MCV: 91.9 fL (ref 78.0–100.0)
Platelets: 257 10*3/uL (ref 150–400)
RBC: 3.22 MIL/uL — AB (ref 3.87–5.11)
RDW: 20.2 % — ABNORMAL HIGH (ref 11.5–15.5)
WBC: 8.1 10*3/uL (ref 4.0–10.5)

## 2015-03-27 MED ORDER — SODIUM CHLORIDE 0.9 % IV SOLN: 100.0000 mL | INTRAVENOUS | Status: DC | PRN

## 2015-03-27 MED ORDER — DIPHENHYDRAMINE HCL 25 MG PO CAPS
25.0000 mg | ORAL_CAPSULE | Freq: Once | ORAL | Status: AC
  Administered 2015-03-27: 25 mg via ORAL
  Filled 2015-03-27: qty 1

## 2015-03-27 MED ORDER — HEPARIN SODIUM (PORCINE) 1000 UNIT/ML DIALYSIS: 1000.0000 [IU] | INTRAMUSCULAR | Status: DC | PRN

## 2015-03-27 MED ORDER — LIDOCAINE-PRILOCAINE 2.5-2.5 % EX CREA: 1.0000 "application " | TOPICAL_CREAM | CUTANEOUS | Status: DC | PRN

## 2015-03-27 MED ORDER — HEPARIN SODIUM (PORCINE) 1000 UNIT/ML DIALYSIS: 100.0000 [IU]/kg | INTRAMUSCULAR | Status: DC | PRN

## 2015-03-27 MED ORDER — LIDOCAINE HCL (PF) 1 % IJ SOLN: 5.0000 mL | INTRAMUSCULAR | Status: DC | PRN

## 2015-03-27 MED ORDER — ALTEPLASE 2 MG IJ SOLR: 2.0000 mg | Freq: Once | INTRAMUSCULAR | Status: DC | PRN

## 2015-03-27 MED ORDER — PENTAFLUOROPROP-TETRAFLUOROETH EX AERO: 1.0000 "application " | INHALATION_SPRAY | CUTANEOUS | Status: DC | PRN

## 2015-03-27 MED ORDER — ALPRAZOLAM 0.25 MG PO TABS
0.5000 mg | ORAL_TABLET | Freq: Once | ORAL | Status: AC
  Administered 2015-03-27: 0.5 mg via ORAL
  Filled 2015-03-27: qty 2

## 2015-03-27 NOTE — ED Notes (Signed)
Case worker at bedside.

## 2015-03-27 NOTE — ED Notes (Signed)
Patient is requesting Benadryl, Xanax and Pain medication for her arthritic hip and nerves.  PA aware, will follow up with patient.
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
## 2015-03-27 NOTE — ED Notes (Signed)
Pt requesting dinner tray ordered.  Dinner order placed with [facility].
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
## 2015-03-27 NOTE — ED Notes (Signed)
Pt here for routine dialysis treatment; pt last dialysis was sat night; pt denies other complaints
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
## 2015-03-27 NOTE — ED Notes (Signed)
PA at bedside.

## 2015-03-27 NOTE — ED Notes (Addendum)
Pt asked for a bus pass and then seen putting her belongings in a car. Pt left in a private vehicle after being given a bus pass.

## 2015-03-27 NOTE — ED Notes (Signed)
Pt states she will take her own Tramadol for her pain.  PA made aware

## 2015-03-27 NOTE — ED Notes (Signed)
Discharged home from dialysis 

## 2015-03-27 NOTE — ED Notes (Signed)
Food at bedside.

## 2015-03-28 ENCOUNTER — Other Ambulatory Visit: Payer: Self-pay

## 2015-03-28 DIAGNOSIS — N186 End stage renal disease: Secondary | ICD-10-CM

## 2015-03-28 NOTE — Patient Outreach (Signed)
[facility] [facility]) Care Management  03/28/2015  Sara [name] 06-Aug-1980 [id number]   Telephone Screen  Referral Date: 03/28/15 Referral Source: [place], high ED utilization report Referral Reason: ESRD, high ED utilization   Outreach attempt # 1 to patient. Patient reached. RN CM identified self. Patient immediately became upset, hollering and yelling over the phone. She stated she was "tired of talking to folks and telling people the same info over and over again and nothing happening, nobody ever does anything." S he then preceded to say how she blames [facility] system and healthcare workers for the medical problems she is having and "no one is helping." Attempted multiple times to interject patient without success. Advised patient that she had spoken with [facility] hospital liaison on yesterday and we are calling to follow up to see how we may be of service. Patient continued to yell and scream about no one helping her or ever doing anything and how she is "tired of talking to folks." Patient then preceded to abruptly state "don't call me anyone, if I need anything I will call you" and very loudly and forcibly hung up the phone.  Plan: RN CM will discuss and review case with [facility] leadership to determine next course of action.   Enzo Montgomery, RN,BSN,CCM [facility] Telephonic Care Management Coordinator Direct Phone: [phone] Toll Free: [phone] Fax: [phone]

## 2015-03-28 NOTE — Patient Outreach (Signed)
[facility]) Care Management  03/28/2015  Sara [name] 12-16-1980 [id number]   Patient referred to telephonic care coordinator for outreach due to care needs.  Also referred to social work for counseling and housing needs.    Jone Baseman, RN, MSN [place] [phone]

## 2015-03-29 ENCOUNTER — Other Ambulatory Visit: Payer: Self-pay | Admitting: Licensed Clinical Social Worker

## 2015-03-29 NOTE — Patient Outreach (Signed)
[facility]) Care Management  03/29/2015  Sara [name] 01/14/1981 [id number]  Assessment-CSW received update from [place] in regards to patient. CSW completed initial outreach on 03/29/15. Patient answered and CSW was able to introduce self, reason for referral and of [place] social work services. Patient stated "I can't talk to you all when I am trying to do my job. Do not call me while I am working. Everyone calls me when I can't talk." CSW expressed apologies and asked patient to contact her once she was available. Patient agreed to do so before hanging up.  Plan-CSW has updated RNCM. CSW will await call back from patient once she is available.  Eula Fried, BSW, MSW, [email or web address] Phone: [phone] Fax: [phone]

## 2015-03-30 ENCOUNTER — Encounter (HOSPITAL_COMMUNITY): Payer: Self-pay | Admitting: Emergency Medicine

## 2015-03-30 ENCOUNTER — Emergency Department (HOSPITAL_COMMUNITY)
Admission: EM | Admit: 2015-03-30 | Discharge: 2015-03-30 | Discharge status: Against Medical Advice | Payer: Medicare Other | Attending: Emergency Medicine | Admitting: Emergency Medicine

## 2015-03-30 DIAGNOSIS — R000 Tachycardia, unspecified: Secondary | ICD-10-CM | POA: Diagnosis not present

## 2015-03-30 DIAGNOSIS — F17210 Nicotine dependence, cigarettes, uncomplicated: Secondary | ICD-10-CM | POA: Insufficient documentation

## 2015-03-30 DIAGNOSIS — Z5321 Procedure and treatment not carried out due to patient leaving prior to being seen by health care provider: Secondary | ICD-10-CM | POA: Diagnosis not present

## 2015-03-30 DIAGNOSIS — Z862 Personal history of diseases of the blood and blood-forming organs and certain disorders involving the immune mechanism: Secondary | ICD-10-CM | POA: Diagnosis not present

## 2015-03-30 DIAGNOSIS — Z7952 Long term (current) use of systemic steroids: Secondary | ICD-10-CM | POA: Diagnosis not present

## 2015-03-30 DIAGNOSIS — I120 Hypertensive chronic kidney disease with stage 5 chronic kidney disease or end stage renal disease: Secondary | ICD-10-CM | POA: Diagnosis not present

## 2015-03-30 DIAGNOSIS — Z79899 Other long term (current) drug therapy: Secondary | ICD-10-CM | POA: Diagnosis not present

## 2015-03-30 DIAGNOSIS — N898 Other specified noninflammatory disorders of vagina: Secondary | ICD-10-CM | POA: Insufficient documentation

## 2015-03-30 DIAGNOSIS — Z8739 Personal history of other diseases of the musculoskeletal system and connective tissue: Secondary | ICD-10-CM | POA: Diagnosis not present

## 2015-03-30 DIAGNOSIS — Z765 Malingerer [conscious simulation]: Secondary | ICD-10-CM

## 2015-03-30 DIAGNOSIS — I509 Heart failure, unspecified: Secondary | ICD-10-CM | POA: Diagnosis not present

## 2015-03-30 DIAGNOSIS — Z5329 Procedure and treatment not carried out because of patient's decision for other reasons: Secondary | ICD-10-CM

## 2015-03-30 DIAGNOSIS — Z4931 Encounter for adequacy testing for hemodialysis: Secondary | ICD-10-CM | POA: Diagnosis not present

## 2015-03-30 DIAGNOSIS — Z7289 Other problems related to lifestyle: Secondary | ICD-10-CM | POA: Insufficient documentation

## 2015-03-30 DIAGNOSIS — Z8659 Personal history of other mental and behavioral disorders: Secondary | ICD-10-CM | POA: Insufficient documentation

## 2015-03-30 DIAGNOSIS — N186 End stage renal disease: Secondary | ICD-10-CM | POA: Diagnosis not present

## 2015-03-30 DIAGNOSIS — Z5320 Procedure and treatment not carried out because of patient's decision for unspecified reasons: Secondary | ICD-10-CM

## 2015-03-30 DIAGNOSIS — Z992 Dependence on renal dialysis: Secondary | ICD-10-CM | POA: Diagnosis not present

## 2015-03-30 NOTE — ED Notes (Signed)
Security at bedside rt ED AD requesting their presence. Pt continues to talk and going off on tangents.

## 2015-03-30 NOTE — ED Notes (Signed)
Pt calls out and again asks for her lunch tray and dialysis. Pt states she still hasn't received her coffee.

## 2015-03-30 NOTE — ED Notes (Signed)
Pt behind the nurses station weighing herself. Pt informed that is a violation of hippa and needs to return to her room.

## 2015-03-30 NOTE — ED Notes (Signed)
Pt states she needs "some xanies and a perc". Pt informed she will not receive either and the provider will be in shortly.

## 2015-03-30 NOTE — ED Notes (Signed)
Pt from home for eval of white, thick, itching discharge from vagina x3 days, denies any abd pain at this time. Pt also needs dialysis states last treatment on 03/27/15. Pt reports sob and itching to skin. Requesting xanax, benadryl, nad noted.
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
## 2015-03-30 NOTE — ED Notes (Signed)
Pt decides to leave and go to [place] escorted out by security.

## 2015-03-30 NOTE — ED Notes (Addendum)
Pt received lunch tray. Pt refuses to be hooked up to monitor to obtain vital signs.

## 2015-03-30 NOTE — ED Notes (Signed)
While setting up for pelvic pt states she doesn't want the exam, this RN then starts putting the stirrups up then the pt states well, I will have it done here. The RN then begins putting the stirrups back on the bed and the pt changes her mind once again. The pt has decided she doesn't want the pelvic. Pt states, "Give me my lunch tray and weigh me, I need my lunch to go to dialysis and I need you to weigh me. I don't want the pelvic done here I will go to my gynecologist. I need the doctor to come in and give me a percocet for my pain and a xanax for my anxiety. You need to weigh me, I need dialysis, bring me coffee with 2 creams and 7 sugars". Pt informed that her wants will be addressed after the needs of others are taken care of.

## 2015-03-30 NOTE — ED Notes (Signed)
Pt requesting lunch, provided with lunch menu.

## 2015-03-30 NOTE — ED Notes (Signed)
Lunch tray ordered 

## 2015-03-30 NOTE — ED Notes (Signed)
Pt calls out requesting blankets and a drink.

## 2015-03-31 ENCOUNTER — Non-Acute Institutional Stay (HOSPITAL_COMMUNITY)
Admission: EM | Admit: 2015-03-31 | Discharge: 2015-03-31 | Discharge status: Against Medical Advice | Payer: Medicare Other | Attending: Emergency Medicine | Admitting: Emergency Medicine

## 2015-03-31 ENCOUNTER — Encounter (HOSPITAL_COMMUNITY): Payer: Self-pay

## 2015-03-31 ENCOUNTER — Emergency Department (HOSPITAL_COMMUNITY): Payer: Medicare Other

## 2015-03-31 DIAGNOSIS — D649 Anemia, unspecified: Secondary | ICD-10-CM | POA: Insufficient documentation

## 2015-03-31 DIAGNOSIS — R6889 Other general symptoms and signs: Secondary | ICD-10-CM | POA: Diagnosis not present

## 2015-03-31 DIAGNOSIS — I5022 Chronic systolic (congestive) heart failure: Secondary | ICD-10-CM | POA: Diagnosis not present

## 2015-03-31 DIAGNOSIS — F17210 Nicotine dependence, cigarettes, uncomplicated: Secondary | ICD-10-CM | POA: Diagnosis not present

## 2015-03-31 DIAGNOSIS — F319 Bipolar disorder, unspecified: Secondary | ICD-10-CM | POA: Diagnosis not present

## 2015-03-31 DIAGNOSIS — I120 Hypertensive chronic kidney disease with stage 5 chronic kidney disease or end stage renal disease: Secondary | ICD-10-CM | POA: Diagnosis not present

## 2015-03-31 DIAGNOSIS — I132 Hypertensive heart and chronic kidney disease with heart failure and with stage 5 chronic kidney disease, or end stage renal disease: Secondary | ICD-10-CM | POA: Diagnosis not present

## 2015-03-31 DIAGNOSIS — Z7952 Long term (current) use of systemic steroids: Secondary | ICD-10-CM | POA: Insufficient documentation

## 2015-03-31 DIAGNOSIS — R0602 Shortness of breath: Secondary | ICD-10-CM | POA: Diagnosis not present

## 2015-03-31 DIAGNOSIS — M3214 Glomerular disease in systemic lupus erythematosus: Secondary | ICD-10-CM | POA: Insufficient documentation

## 2015-03-31 DIAGNOSIS — F209 Schizophrenia, unspecified: Secondary | ICD-10-CM | POA: Diagnosis not present

## 2015-03-31 DIAGNOSIS — Z992 Dependence on renal dialysis: Secondary | ICD-10-CM | POA: Insufficient documentation

## 2015-03-31 DIAGNOSIS — Z79899 Other long term (current) drug therapy: Secondary | ICD-10-CM | POA: Diagnosis not present

## 2015-03-31 DIAGNOSIS — N186 End stage renal disease: Secondary | ICD-10-CM | POA: Diagnosis not present

## 2015-03-31 DIAGNOSIS — E8770 Fluid overload, unspecified: Secondary | ICD-10-CM | POA: Diagnosis present

## 2015-03-31 LAB — CBC
HCT: 26.7 % — ABNORMAL LOW (ref 36.0–46.0)
HEMOGLOBIN: 8.4 g/dL — AB (ref 12.0–15.0)
MCH: 29.1 pg (ref 26.0–34.0)
MCHC: 31.5 g/dL (ref 30.0–36.0)
MCV: 92.4 fL (ref 78.0–100.0)
Platelets: 205 10*3/uL (ref 150–400)
RBC: 2.89 MIL/uL — ABNORMAL LOW (ref 3.87–5.11)
RDW: 19.7 % — AB (ref 11.5–15.5)
WBC: 7.8 10*3/uL (ref 4.0–10.5)

## 2015-03-31 LAB — RENAL FUNCTION PANEL
ALBUMIN: 2.8 g/dL — AB (ref 3.5–5.0)
ANION GAP: 17 — AB (ref 5–15)
BUN: 125 mg/dL — ABNORMAL HIGH (ref 6–20)
CALCIUM: 9.6 mg/dL (ref 8.9–10.3)
CO2: 20 mmol/L — ABNORMAL LOW (ref 22–32)
Chloride: 101 mmol/L (ref 101–111)
Creatinine, Ser: 13.42 mg/dL — ABNORMAL HIGH (ref 0.44–1.00)
GFR calc Af Amer: 4 mL/min — ABNORMAL LOW (ref >60)
GFR, EST NON AFRICAN AMERICAN: 3 mL/min — AB (ref >60)
GLUCOSE: 96 mg/dL (ref 65–99)
PHOSPHORUS: 8.7 mg/dL — AB (ref 2.5–4.6)
Potassium: 6.3 mmol/L (ref 3.5–5.1)
SODIUM: 138 mmol/L (ref 135–145)

## 2015-03-31 MED ORDER — LIDOCAINE-PRILOCAINE 2.5-2.5 % EX CREA
1.0000 | TOPICAL_CREAM | CUTANEOUS | Status: DC | PRN
Start: 2015-03-31 — End: 2015-03-31
  Filled 2015-03-31: qty 5

## 2015-03-31 MED ORDER — LIDOCAINE HCL (PF) 1 % IJ SOLN: 5.0000 mL | INTRAMUSCULAR | Status: DC | PRN

## 2015-03-31 MED ORDER — PENTAFLUOROPROP-TETRAFLUOROETH EX AERO
1.0000 "application " | INHALATION_SPRAY | CUTANEOUS | Status: DC | PRN
  Filled 2015-03-31: qty 30

## 2015-03-31 MED ORDER — SODIUM CHLORIDE 0.9 % IV SOLN: 100.0000 mL | INTRAVENOUS | Status: DC | PRN

## 2015-03-31 NOTE — ED Notes (Signed)
POD C RN refused care.  Pt will remain in A5

## 2015-03-31 NOTE — ED Notes (Signed)
Pt reports 18lbs over dry weight
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
## 2015-03-31 NOTE — ED Notes (Signed)
Spoke with Dialysis, will take patient in 20-minutes if she eats something first.  Sandwich given.

## 2015-03-31 NOTE — ED Notes (Addendum)
Will transfer to C26, report given to [facility]

## 2015-03-31 NOTE — Procedures (Signed)
Hemodialysis completed. Patient signed of AMA 1 hour early due to cramps. First 2 hours of treatment, patient calm, resting well. Once awake, pt became angry that she had not eaten. Offered pt graham crackers, patient accepted. Pt asked for a snack bag. Bag given with peaches and a muffin. Patient still angry and agitated about having no food tray. Per policy, food trays not allowed while patient is on dialysis. Agitation and anxiety worsening over the next hour until patient asked to be taken off early. Patient verbally abusive during this time. Rinseback completed. Needle sites held with hemostasis achieved. Pt discharged to self care, ambulatory.

## 2015-03-31 NOTE — ED Notes (Signed)
Pt arrived via [facility] c/o SOB.  Tuesday, Thursday, Saturday dialysis, missed treatment yesterday.

## 2015-04-02 ENCOUNTER — Encounter (HOSPITAL_COMMUNITY): Payer: Self-pay | Admitting: *Deleted

## 2015-04-02 ENCOUNTER — Non-Acute Institutional Stay (HOSPITAL_COMMUNITY)
Admission: EM | Admit: 2015-04-02 | Discharge: 2015-04-02 | Disposition: A | Discharge status: Home / Self Care | Payer: Medicare Other | Attending: Emergency Medicine | Admitting: Emergency Medicine

## 2015-04-02 DIAGNOSIS — M3214 Glomerular disease in systemic lupus erythematosus: Secondary | ICD-10-CM | POA: Insufficient documentation

## 2015-04-02 DIAGNOSIS — Z992 Dependence on renal dialysis: Secondary | ICD-10-CM | POA: Insufficient documentation

## 2015-04-02 DIAGNOSIS — I5022 Chronic systolic (congestive) heart failure: Secondary | ICD-10-CM | POA: Diagnosis not present

## 2015-04-02 DIAGNOSIS — D649 Anemia, unspecified: Secondary | ICD-10-CM | POA: Insufficient documentation

## 2015-04-02 DIAGNOSIS — N186 End stage renal disease: Secondary | ICD-10-CM | POA: Diagnosis not present

## 2015-04-02 DIAGNOSIS — F319 Bipolar disorder, unspecified: Secondary | ICD-10-CM | POA: Diagnosis not present

## 2015-04-02 DIAGNOSIS — M1990 Unspecified osteoarthritis, unspecified site: Secondary | ICD-10-CM | POA: Diagnosis not present

## 2015-04-02 DIAGNOSIS — N185 Chronic kidney disease, stage 5: Secondary | ICD-10-CM

## 2015-04-02 DIAGNOSIS — I120 Hypertensive chronic kidney disease with stage 5 chronic kidney disease or end stage renal disease: Secondary | ICD-10-CM | POA: Diagnosis not present

## 2015-04-02 DIAGNOSIS — F17210 Nicotine dependence, cigarettes, uncomplicated: Secondary | ICD-10-CM | POA: Insufficient documentation

## 2015-04-02 DIAGNOSIS — I132 Hypertensive heart and chronic kidney disease with heart failure and with stage 5 chronic kidney disease, or end stage renal disease: Secondary | ICD-10-CM | POA: Insufficient documentation

## 2015-04-02 DIAGNOSIS — F209 Schizophrenia, unspecified: Secondary | ICD-10-CM | POA: Diagnosis not present

## 2015-04-02 DIAGNOSIS — Z79899 Other long term (current) drug therapy: Secondary | ICD-10-CM | POA: Diagnosis not present

## 2015-04-02 DIAGNOSIS — Z7952 Long term (current) use of systemic steroids: Secondary | ICD-10-CM | POA: Insufficient documentation

## 2015-04-02 LAB — RENAL FUNCTION PANEL
Albumin: 3 g/dL — ABNORMAL LOW (ref 3.5–5.0)
Anion gap: 20 — ABNORMAL HIGH (ref 5–15)
BUN: 83 mg/dL — ABNORMAL HIGH (ref 6–20)
CO2: 22 mmol/L (ref 22–32)
Calcium: 9.3 mg/dL (ref 8.9–10.3)
Chloride: 98 mmol/L — ABNORMAL LOW (ref 101–111)
Creatinine, Ser: 12.32 mg/dL — ABNORMAL HIGH (ref 0.44–1.00)
GFR calc Af Amer: 4 mL/min — ABNORMAL LOW (ref >60)
GFR calc non Af Amer: 4 mL/min — ABNORMAL LOW (ref >60)
Glucose, Bld: 114 mg/dL — ABNORMAL HIGH (ref 65–99)
Phosphorus: 11.4 mg/dL — ABNORMAL HIGH (ref 2.5–4.6)
Potassium: 5.3 mmol/L — ABNORMAL HIGH (ref 3.5–5.1)
Sodium: 140 mmol/L (ref 135–145)

## 2015-04-02 LAB — CBC
HCT: 26.2 % — ABNORMAL LOW (ref 36.0–46.0)
Hemoglobin: 8.3 g/dL — ABNORMAL LOW (ref 12.0–15.0)
MCH: 28.7 pg (ref 26.0–34.0)
MCHC: 31.7 g/dL (ref 30.0–36.0)
MCV: 90.7 fL (ref 78.0–100.0)
Platelets: 136 10*3/uL — ABNORMAL LOW (ref 150–400)
RBC: 2.89 MIL/uL — ABNORMAL LOW (ref 3.87–5.11)
RDW: 19.2 % — ABNORMAL HIGH (ref 11.5–15.5)
WBC: 6.8 K/uL (ref 4.0–10.5)

## 2015-04-02 MED ORDER — DIPHENHYDRAMINE HCL 25 MG PO CAPS
ORAL_CAPSULE | ORAL | Status: AC
  Administered 2015-04-02: 25 mg via ORAL
  Filled 2015-04-02: qty 1

## 2015-04-02 MED ORDER — LIDOCAINE-PRILOCAINE 2.5-2.5 % EX CREA
1.0000 "application " | TOPICAL_CREAM | CUTANEOUS | Status: DC | PRN
  Filled 2015-04-02: qty 5

## 2015-04-02 MED ORDER — ACETAMINOPHEN 325 MG PO TABS
650.0000 mg | ORAL_TABLET | Freq: Once | ORAL | Status: AC
  Administered 2015-04-02: 650 mg via ORAL

## 2015-04-02 MED ORDER — PENTAFLUOROPROP-TETRAFLUOROETH EX AERO
1.0000 "application " | INHALATION_SPRAY | CUTANEOUS | Status: DC | PRN
  Filled 2015-04-02: qty 30

## 2015-04-02 MED ORDER — SODIUM CHLORIDE 0.9 % IV SOLN: 100.0000 mL | INTRAVENOUS | Status: DC | PRN

## 2015-04-02 MED ORDER — DARBEPOETIN ALFA 200 MCG/0.4ML IJ SOSY
PREFILLED_SYRINGE | INTRAMUSCULAR | Status: AC
  Filled 2015-04-02: qty 0.4

## 2015-04-02 MED ORDER — ACETAMINOPHEN 325 MG PO TABS
ORAL_TABLET | ORAL | Status: AC
  Administered 2015-04-02: 650 mg via ORAL
  Filled 2015-04-02: qty 2

## 2015-04-02 MED ORDER — DIPHENHYDRAMINE HCL 25 MG PO CAPS
25.0000 mg | ORAL_CAPSULE | Freq: Once | ORAL | Status: AC
  Administered 2015-04-02: 25 mg via ORAL

## 2015-04-02 MED ORDER — DARBEPOETIN ALFA 200 MCG/0.4ML IJ SOSY: 200.0000 ug | PREFILLED_SYRINGE | Freq: Once | INTRAMUSCULAR | Status: DC

## 2015-04-02 MED ORDER — LIDOCAINE HCL (PF) 1 % IJ SOLN: 5.0000 mL | INTRAMUSCULAR | Status: DC | PRN

## 2015-04-02 MED ORDER — OXYCODONE-ACETAMINOPHEN 5-325 MG PO TABS
1.0000 | ORAL_TABLET | Freq: Once | ORAL | Status: AC
Start: 2015-04-02 — End: 2015-04-02
  Administered 2015-04-02: 1 via ORAL
  Filled 2015-04-02: qty 1

## 2015-04-02 MED ORDER — ALTEPLASE 2 MG IJ SOLR
2.0000 mg | Freq: Once | INTRAMUSCULAR | Status: DC | PRN
  Filled 2015-04-02: qty 2

## 2015-04-02 NOTE — ED Notes (Signed)
Pt reports missing dialysis on Thursday (pt is normally T/Th/Sat schedule) pt had dialysis on Saturday however states she is still 17lbs heavier then normal. Pt also c/o arthritis to joins and lower back, states her tramadol rx is not helping with the pain.

## 2015-04-02 NOTE — Procedures (Addendum)
Hemodialysis treatment terminated early with 1 hour 17 minutes remaining due to "cramps". Near end of treatment, venous pressures rising and TMP becoming +. Rinsed back blood successfully but pt refused to restart treatment. Needles removed and sites held. Hemostasis achieved. Patient independent and ambulatory. Discharged patient to home/self care.  Unable to administer Aranesp as ordered due to early termination of dialysis while pending pharmacy verification of medication.

## 2015-04-02 NOTE — Procedures (Signed)
  I was present at this dialysis session, have reviewed the session itself and made  appropriate changes Kelly Splinter MD [place] pager [phone]    cell [phone] 04/02/2015, 4:36 PM
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
## 2015-04-02 NOTE — ED Notes (Signed)
Pt. Provided meal tray. Pt. Ate 95% of meal

## 2015-04-04 ENCOUNTER — Non-Acute Institutional Stay (HOSPITAL_COMMUNITY)
Admission: EM | Admit: 2015-04-04 | Discharge: 2015-04-04 | Disposition: A | Discharge status: Home / Self Care | Payer: Medicare Other | Attending: Emergency Medicine | Admitting: Emergency Medicine

## 2015-04-04 ENCOUNTER — Emergency Department (HOSPITAL_COMMUNITY): Payer: Medicare Other

## 2015-04-04 ENCOUNTER — Encounter (HOSPITAL_COMMUNITY): Payer: Self-pay | Admitting: *Deleted

## 2015-04-04 DIAGNOSIS — I5022 Chronic systolic (congestive) heart failure: Secondary | ICD-10-CM | POA: Diagnosis not present

## 2015-04-04 DIAGNOSIS — D649 Anemia, unspecified: Secondary | ICD-10-CM | POA: Diagnosis not present

## 2015-04-04 DIAGNOSIS — Z79899 Other long term (current) drug therapy: Secondary | ICD-10-CM | POA: Insufficient documentation

## 2015-04-04 DIAGNOSIS — I132 Hypertensive heart and chronic kidney disease with heart failure and with stage 5 chronic kidney disease, or end stage renal disease: Secondary | ICD-10-CM | POA: Diagnosis not present

## 2015-04-04 DIAGNOSIS — F17210 Nicotine dependence, cigarettes, uncomplicated: Secondary | ICD-10-CM | POA: Diagnosis not present

## 2015-04-04 DIAGNOSIS — F209 Schizophrenia, unspecified: Secondary | ICD-10-CM | POA: Insufficient documentation

## 2015-04-04 DIAGNOSIS — Z7952 Long term (current) use of systemic steroids: Secondary | ICD-10-CM | POA: Diagnosis not present

## 2015-04-04 DIAGNOSIS — Z992 Dependence on renal dialysis: Secondary | ICD-10-CM | POA: Diagnosis not present

## 2015-04-04 DIAGNOSIS — F319 Bipolar disorder, unspecified: Secondary | ICD-10-CM | POA: Insufficient documentation

## 2015-04-04 DIAGNOSIS — N186 End stage renal disease: Secondary | ICD-10-CM | POA: Diagnosis not present

## 2015-04-04 DIAGNOSIS — N19 Unspecified kidney failure: Secondary | ICD-10-CM | POA: Diagnosis not present

## 2015-04-04 DIAGNOSIS — M3214 Glomerular disease in systemic lupus erythematosus: Secondary | ICD-10-CM | POA: Insufficient documentation

## 2015-04-04 DIAGNOSIS — R079 Chest pain, unspecified: Secondary | ICD-10-CM | POA: Diagnosis not present

## 2015-04-04 LAB — RENAL FUNCTION PANEL
Albumin: 2.8 g/dL — ABNORMAL LOW (ref 3.5–5.0)
Anion gap: 17 — ABNORMAL HIGH (ref 5–15)
BUN: 65 mg/dL — ABNORMAL HIGH (ref 6–20)
CO2: 23 mmol/L (ref 22–32)
Calcium: 8.9 mg/dL (ref 8.9–10.3)
Chloride: 98 mmol/L — ABNORMAL LOW (ref 101–111)
Creatinine, Ser: 11.19 mg/dL — ABNORMAL HIGH (ref 0.44–1.00)
GFR calc Af Amer: 5 mL/min — ABNORMAL LOW (ref >60)
GFR calc non Af Amer: 4 mL/min — ABNORMAL LOW (ref >60)
Glucose, Bld: 124 mg/dL — ABNORMAL HIGH (ref 65–99)
Phosphorus: 11.1 mg/dL — ABNORMAL HIGH (ref 2.5–4.6)
Potassium: 4.9 mmol/L (ref 3.5–5.1)
Sodium: 138 mmol/L (ref 135–145)

## 2015-04-04 LAB — CBC
HCT: 26.3 % — ABNORMAL LOW (ref 36.0–46.0)
Hemoglobin: 8.5 g/dL — ABNORMAL LOW (ref 12.0–15.0)
MCH: 29.3 pg (ref 26.0–34.0)
MCHC: 32.3 g/dL (ref 30.0–36.0)
MCV: 90.7 fL (ref 78.0–100.0)
Platelets: 135 10*3/uL — ABNORMAL LOW (ref 150–400)
RBC: 2.9 MIL/uL — ABNORMAL LOW (ref 3.87–5.11)
RDW: 18.9 % — ABNORMAL HIGH (ref 11.5–15.5)
WBC: 5.2 10*3/uL (ref 4.0–10.5)

## 2015-04-04 LAB — HEPATITIS B SURFACE ANTIGEN: HEP B S AG: NEGATIVE

## 2015-04-04 MED ORDER — SODIUM CHLORIDE 0.9 % IV SOLN: 100.0000 mL | INTRAVENOUS | Status: DC | PRN

## 2015-04-04 MED ORDER — PENTAFLUOROPROP-TETRAFLUOROETH EX AERO: 1.0000 "application " | INHALATION_SPRAY | CUTANEOUS | Status: DC | PRN

## 2015-04-04 MED ORDER — LIDOCAINE-PRILOCAINE 2.5-2.5 % EX CREA
1.0000 "application " | TOPICAL_CREAM | CUTANEOUS | Status: DC | PRN
  Filled 2015-04-04: qty 5

## 2015-04-04 MED ORDER — DIPHENHYDRAMINE HCL 25 MG PO CAPS
ORAL_CAPSULE | ORAL | Status: AC
  Administered 2015-04-04: 25 mg via ORAL
  Filled 2015-04-04: qty 1

## 2015-04-04 MED ORDER — DIPHENHYDRAMINE HCL 25 MG PO CAPS
25.0000 mg | ORAL_CAPSULE | Freq: Once | ORAL | Status: AC
  Administered 2015-04-04: 25 mg via ORAL

## 2015-04-04 MED ORDER — ACETAMINOPHEN 325 MG PO TABS
650.0000 mg | ORAL_TABLET | Freq: Once | ORAL | Status: AC
  Administered 2015-04-04: 650 mg via ORAL

## 2015-04-04 MED ORDER — DARBEPOETIN ALFA 200 MCG/0.4ML IJ SOSY
200.0000 ug | PREFILLED_SYRINGE | Freq: Once | INTRAMUSCULAR | Status: AC
  Administered 2015-04-04: 200 ug via INTRAVENOUS
  Filled 2015-04-04: qty 0.4

## 2015-04-04 MED ORDER — DARBEPOETIN ALFA 200 MCG/0.4ML IJ SOSY
PREFILLED_SYRINGE | INTRAMUSCULAR | Status: AC
  Administered 2015-04-04: 200 ug via INTRAVENOUS
  Filled 2015-04-04: qty 0.4

## 2015-04-04 MED ORDER — HEPARIN SODIUM (PORCINE) 1000 UNIT/ML DIALYSIS: 1000.0000 [IU] | INTRAMUSCULAR | Status: DC | PRN

## 2015-04-04 MED ORDER — ACETAMINOPHEN 325 MG PO TABS
ORAL_TABLET | ORAL | Status: AC
  Administered 2015-04-04: 650 mg via ORAL
  Filled 2015-04-04: qty 2

## 2015-04-04 MED ORDER — LIDOCAINE HCL (PF) 1 % IJ SOLN: 5.0000 mL | INTRAMUSCULAR | Status: DC | PRN

## 2015-04-04 MED ORDER — ALTEPLASE 2 MG IJ SOLR
2.0000 mg | Freq: Once | INTRAMUSCULAR | Status: DC | PRN
  Filled 2015-04-04: qty 2

## 2015-04-04 NOTE — ED Notes (Signed)
Breakfast ordered 

## 2015-04-04 NOTE — ED Notes (Signed)
Pt eating lunch, pt instructed to stop calling Dialysis unit to inquire on availability.  Pt verbalized understanding.

## 2015-04-04 NOTE — ED Notes (Signed)
Pt reports last dialysis on Monday.  Pt states that she is out of her pain medicines for her back as well. Pt reports congestion with cough.

## 2015-04-04 NOTE — ED Notes (Signed)
Lunch meal given. 

## 2015-04-04 NOTE — ED Notes (Addendum)
Spoke with dialysis nurse-- pt had multiple times to come for dialysis and missed scheduled times. Will probably be at 12 - for machine to be available. Explained this to patient. Dialysis nurse asked that pt eat prior to coming up to dialysis -- in order for her to be ready when they call.

## 2015-04-04 NOTE — ED Notes (Signed)
Pt transported to hemodialysis via stretcher °

## 2015-04-04 NOTE — Progress Notes (Signed)
CSW engaged with Patient at her bedside at RN request. Patient reports frustrations with the wait time for dialysis. CSW provided emotional support and brief counseling regarding patient's anxiety and frustrations. RN came in to inform patient that she was ready to be transported to the dialysis unit. CSW signing off as no further social work interventions needed at this time. Please contact if new need(s) arise.   Holly Bodily, [place]

## 2015-04-04 NOTE — ED Notes (Signed)
Breakfast meal given 

## 2015-04-04 NOTE — ED Notes (Signed)
Pt complaining of R sided chest pain x 4 days.  Pt reports pain radiates into R scapula; has been constant and worsens with deep inspiration and movement.  Pt reports productive cough with yellow sputum.  Lungs are clear to posterior fields, pt is in no acute distress, respirations are even, unlabored with equal chest rise.  Pt ate all of her breakfast except grits.  Last HD was Monday, received 3 hour treatment.

## 2015-04-04 NOTE — ED Notes (Addendum)
Regular diet ordered for patient. 
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
## 2015-04-07 ENCOUNTER — Encounter (HOSPITAL_COMMUNITY): Payer: Self-pay | Admitting: *Deleted

## 2015-04-07 DIAGNOSIS — I120 Hypertensive chronic kidney disease with stage 5 chronic kidney disease or end stage renal disease: Secondary | ICD-10-CM | POA: Insufficient documentation

## 2015-04-07 DIAGNOSIS — N186 End stage renal disease: Secondary | ICD-10-CM | POA: Diagnosis not present

## 2015-04-07 DIAGNOSIS — R05 Cough: Secondary | ICD-10-CM | POA: Diagnosis not present

## 2015-04-07 DIAGNOSIS — Z992 Dependence on renal dialysis: Secondary | ICD-10-CM | POA: Insufficient documentation

## 2015-04-07 NOTE — ED Notes (Signed)
Patient presents sating her chest is sore from coughing, also needs her dialysis.  Weight is up 11 pounds

## 2015-04-08 ENCOUNTER — Non-Acute Institutional Stay (HOSPITAL_COMMUNITY)
Admission: EM | Admit: 2015-04-08 | Discharge: 2015-04-08 | Disposition: A | Discharge status: Home / Self Care | Payer: Medicare Other | Attending: Nephrology | Admitting: Nephrology

## 2015-04-08 DIAGNOSIS — I120 Hypertensive chronic kidney disease with stage 5 chronic kidney disease or end stage renal disease: Secondary | ICD-10-CM | POA: Diagnosis not present

## 2015-04-08 DIAGNOSIS — N189 Chronic kidney disease, unspecified: Secondary | ICD-10-CM | POA: Diagnosis present

## 2015-04-08 LAB — BASIC METABOLIC PANEL
ANION GAP: 17 — AB (ref 5–15)
BUN: 63 mg/dL — ABNORMAL HIGH (ref 6–20)
CHLORIDE: 99 mmol/L — AB (ref 101–111)
CO2: 23 mmol/L (ref 22–32)
Calcium: 9 mg/dL (ref 8.9–10.3)
Creatinine, Ser: 13.25 mg/dL — ABNORMAL HIGH (ref 0.44–1.00)
GFR, EST AFRICAN AMERICAN: 4 mL/min — AB (ref >60)
GFR, EST NON AFRICAN AMERICAN: 3 mL/min — AB (ref >60)
Glucose, Bld: 108 mg/dL — ABNORMAL HIGH (ref 65–99)
POTASSIUM: 4.7 mmol/L (ref 3.5–5.1)
SODIUM: 139 mmol/L (ref 135–145)

## 2015-04-08 LAB — CBC
HCT: 25.6 % — ABNORMAL LOW (ref 36.0–46.0)
HEMOGLOBIN: 8.3 g/dL — AB (ref 12.0–15.0)
MCH: 29.1 pg (ref 26.0–34.0)
MCHC: 32.4 g/dL (ref 30.0–36.0)
MCV: 89.8 fL (ref 78.0–100.0)
PLATELETS: 253 10*3/uL (ref 150–400)
RBC: 2.85 MIL/uL — AB (ref 3.87–5.11)
RDW: 19.1 % — ABNORMAL HIGH (ref 11.5–15.5)
WBC: 5.3 10*3/uL (ref 4.0–10.5)

## 2015-04-08 MED ORDER — SODIUM CHLORIDE 0.9 % IV SOLN: 100.0000 mL | INTRAVENOUS | Status: DC | PRN

## 2015-04-08 MED ORDER — ACETAMINOPHEN 325 MG PO TABS
ORAL_TABLET | ORAL | Status: AC
  Administered 2015-04-08: 650 mg via ORAL
  Filled 2015-04-08: qty 2

## 2015-04-08 MED ORDER — ACETAMINOPHEN 325 MG PO TABS
650.0000 mg | ORAL_TABLET | Freq: Once | ORAL | Status: AC
  Administered 2015-04-08: 650 mg via ORAL

## 2015-04-08 MED ORDER — LIDOCAINE HCL (PF) 1 % IJ SOLN: 5.0000 mL | INTRAMUSCULAR | Status: DC | PRN

## 2015-04-08 MED ORDER — DIPHENHYDRAMINE HCL 25 MG PO CAPS
ORAL_CAPSULE | ORAL | Status: AC
  Administered 2015-04-08: 25 mg via ORAL
  Filled 2015-04-08: qty 1

## 2015-04-08 MED ORDER — HEPARIN SODIUM (PORCINE) 1000 UNIT/ML DIALYSIS
1000.0000 [IU] | INTRAMUSCULAR | Status: DC | PRN
  Filled 2015-04-08: qty 1

## 2015-04-08 MED ORDER — ALTEPLASE 2 MG IJ SOLR
2.0000 mg | Freq: Once | INTRAMUSCULAR | Status: DC | PRN
  Filled 2015-04-08: qty 2

## 2015-04-08 MED ORDER — LIDOCAINE-PRILOCAINE 2.5-2.5 % EX CREA
1.0000 "application " | TOPICAL_CREAM | CUTANEOUS | Status: DC | PRN
  Filled 2015-04-08: qty 5

## 2015-04-08 MED ORDER — DIPHENHYDRAMINE HCL 25 MG PO CAPS
25.0000 mg | ORAL_CAPSULE | Freq: Once | ORAL | Status: AC
  Administered 2015-04-08: 25 mg via ORAL

## 2015-04-08 MED ORDER — HEPARIN SODIUM (PORCINE) 1000 UNIT/ML DIALYSIS
100.0000 [IU]/kg | INTRAMUSCULAR | Status: DC | PRN
  Filled 2015-04-08: qty 7

## 2015-04-08 MED ORDER — PENTAFLUOROPROP-TETRAFLUOROETH EX AERO
1.0000 "application " | INHALATION_SPRAY | CUTANEOUS | Status: DC | PRN
  Filled 2015-04-08: qty 30

## 2015-04-11 ENCOUNTER — Encounter (HOSPITAL_COMMUNITY): Payer: Self-pay | Admitting: Family Medicine

## 2015-04-11 ENCOUNTER — Emergency Department (HOSPITAL_COMMUNITY): Payer: Medicare Other

## 2015-04-11 ENCOUNTER — Non-Acute Institutional Stay (HOSPITAL_COMMUNITY)
Admission: EM | Admit: 2015-04-11 | Discharge: 2015-04-12 | Disposition: A | Discharge status: Home / Self Care | Payer: Medicare Other | Attending: Emergency Medicine | Admitting: Emergency Medicine

## 2015-04-11 DIAGNOSIS — R072 Precordial pain: Secondary | ICD-10-CM | POA: Insufficient documentation

## 2015-04-11 DIAGNOSIS — F209 Schizophrenia, unspecified: Secondary | ICD-10-CM | POA: Insufficient documentation

## 2015-04-11 DIAGNOSIS — F17210 Nicotine dependence, cigarettes, uncomplicated: Secondary | ICD-10-CM | POA: Diagnosis not present

## 2015-04-11 DIAGNOSIS — N186 End stage renal disease: Secondary | ICD-10-CM | POA: Insufficient documentation

## 2015-04-11 DIAGNOSIS — Z79899 Other long term (current) drug therapy: Secondary | ICD-10-CM | POA: Diagnosis not present

## 2015-04-11 DIAGNOSIS — I132 Hypertensive heart and chronic kidney disease with heart failure and with stage 5 chronic kidney disease, or end stage renal disease: Secondary | ICD-10-CM | POA: Insufficient documentation

## 2015-04-11 DIAGNOSIS — D649 Anemia, unspecified: Secondary | ICD-10-CM | POA: Insufficient documentation

## 2015-04-11 DIAGNOSIS — R0602 Shortness of breath: Secondary | ICD-10-CM | POA: Diagnosis not present

## 2015-04-11 DIAGNOSIS — Z7952 Long term (current) use of systemic steroids: Secondary | ICD-10-CM | POA: Insufficient documentation

## 2015-04-11 DIAGNOSIS — R079 Chest pain, unspecified: Secondary | ICD-10-CM | POA: Diagnosis present

## 2015-04-11 DIAGNOSIS — Z992 Dependence on renal dialysis: Secondary | ICD-10-CM | POA: Insufficient documentation

## 2015-04-11 DIAGNOSIS — I509 Heart failure, unspecified: Secondary | ICD-10-CM | POA: Insufficient documentation

## 2015-04-11 DIAGNOSIS — R05 Cough: Secondary | ICD-10-CM | POA: Diagnosis not present

## 2015-04-11 DIAGNOSIS — M3214 Glomerular disease in systemic lupus erythematosus: Secondary | ICD-10-CM | POA: Insufficient documentation

## 2015-04-11 DIAGNOSIS — F319 Bipolar disorder, unspecified: Secondary | ICD-10-CM | POA: Diagnosis not present

## 2015-04-11 LAB — I-STAT CHEM 8, ED
BUN: 51 mg/dL — AB (ref 6–20)
CHLORIDE: 98 mmol/L — AB (ref 101–111)
Calcium, Ion: 1.09 mmol/L — ABNORMAL LOW (ref 1.12–1.23)
Creatinine, Ser: 13.4 mg/dL — ABNORMAL HIGH (ref 0.44–1.00)
Glucose, Bld: 84 mg/dL (ref 65–99)
HEMATOCRIT: 32 % — AB (ref 36.0–46.0)
Hemoglobin: 10.9 g/dL — ABNORMAL LOW (ref 12.0–15.0)
POTASSIUM: 4.8 mmol/L (ref 3.5–5.1)
SODIUM: 137 mmol/L (ref 135–145)
TCO2: 28 mmol/L (ref 0–100)

## 2015-04-11 LAB — CBC WITH DIFFERENTIAL/PLATELET
BASOS ABS: 0 10*3/uL (ref 0.0–0.1)
BASOS PCT: 0 %
EOS ABS: 0.2 10*3/uL (ref 0.0–0.7)
Eosinophils Relative: 3 %
HEMATOCRIT: 29.3 % — AB (ref 36.0–46.0)
HEMOGLOBIN: 9.2 g/dL — AB (ref 12.0–15.0)
Lymphocytes Relative: 21 %
Lymphs Abs: 1.4 10*3/uL (ref 0.7–4.0)
MCH: 28 pg (ref 26.0–34.0)
MCHC: 31.4 g/dL (ref 30.0–36.0)
MCV: 89.3 fL (ref 78.0–100.0)
Monocytes Absolute: 0.4 10*3/uL (ref 0.1–1.0)
Monocytes Relative: 6 %
NEUTROS ABS: 4.5 10*3/uL (ref 1.7–7.7)
NEUTROS PCT: 70 %
Platelets: 398 10*3/uL (ref 150–400)
RBC: 3.28 MIL/uL — AB (ref 3.87–5.11)
RDW: 20.1 % — ABNORMAL HIGH (ref 11.5–15.5)
WBC: 6.4 10*3/uL (ref 4.0–10.5)

## 2015-04-11 MED ORDER — LIDOCAINE-PRILOCAINE 2.5-2.5 % EX CREA: 1.0000 "application " | TOPICAL_CREAM | CUTANEOUS | Status: DC | PRN

## 2015-04-11 MED ORDER — SODIUM CHLORIDE 0.9 % IV SOLN: 100.0000 mL | INTRAVENOUS | Status: DC | PRN

## 2015-04-11 MED ORDER — DARBEPOETIN ALFA 200 MCG/0.4ML IJ SOSY
PREFILLED_SYRINGE | INTRAMUSCULAR | Status: AC
  Administered 2015-04-11: 200 ug via INTRAVENOUS
  Filled 2015-04-11: qty 0.4

## 2015-04-11 MED ORDER — DARBEPOETIN ALFA 200 MCG/0.4ML IJ SOSY
200.0000 ug | PREFILLED_SYRINGE | Freq: Once | INTRAMUSCULAR | Status: AC
  Administered 2015-04-11: 200 ug via INTRAVENOUS

## 2015-04-11 MED ORDER — PENTAFLUOROPROP-TETRAFLUOROETH EX AERO: 1.0000 "application " | INHALATION_SPRAY | CUTANEOUS | Status: DC | PRN

## 2015-04-11 MED ORDER — ALTEPLASE 2 MG IJ SOLR: 2.0000 mg | Freq: Once | INTRAMUSCULAR | Status: DC | PRN

## 2015-04-11 MED ORDER — ACETAMINOPHEN 325 MG PO TABS
650.0000 mg | ORAL_TABLET | Freq: Once | ORAL | Status: AC
  Administered 2015-04-11: 650 mg via ORAL
  Filled 2015-04-11: qty 2

## 2015-04-11 MED ORDER — LIDOCAINE HCL (PF) 1 % IJ SOLN: 5.0000 mL | INTRAMUSCULAR | Status: DC | PRN

## 2015-04-11 NOTE — ED Notes (Signed)
Dialysis called and stated that they are ready for the pt.

## 2015-04-11 NOTE — ED Notes (Signed)
Pt transported to dialysis. Hand off given to [place], RN

## 2015-04-11 NOTE — Consult Note (Signed)
Reason for Consult:ESRD Referring Physician: Dr. Lita Mains  Chief Complaint: Dyspnea  Assessment/Plan: 1. Volume overload - last HD was on Saturday.   - Plan on HD today with next HD Thursday. - Patient currently without a dialysis unit and has been dialyzing at the hospital from the ED for many months. 2. ESRD: Very problematic and as mentioned above currently without a unit. 3. Schizophrenia, Bipolar. 4. Lupus: not active. 5. CHF - mildly decompensated secondary to ESRD but should improve with UF today.   HPI: Sara [name] is an 35 y.o. female who currently does not have a dialysis unit with last dialysis on Saturday now presenting with shortness of breath. She denies chest pain but does have a non productive cough intermittently. She denies fever, chills, myalgias or arthralgias. She also denies abdominal pain, diarrhea, syncope or diplopia.  Past Medical History  Diagnosis Date  . 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 History of blood transfusion     "this is probably my 3rd" (10/09/2014)  . ESRD (end stage renal disease) on dialysis [facility])     "[name]; [facility]" (10/09/2014)  . Bipolar disorder ([place])     [name] 10/09/2014  . Schizophrenia ([place])     [name] 10/09/2014  . Chronic anemia     [name] 10/09/2014  . CHF (congestive heart failure) ([place])     systolic/notes 123XX123  . Acute myopericarditis     hx/notes 10/09/2014  . Psychosis   . Hypertension   . Lupus ([place])   . Lupus nephritis ([place])   . Bipolar 1 disorder ([place])   . Schizophrenia ([place])   . Pregnancy   . ESRD (end stage renal disease) ([place])   . Anemia   . Low back pain     Allergies:  Allergies  Allergen Reactions  . Ativan [Lorazepam] Other (See Comments)    Dysarthria(patient has difficulty speaking and slurred speech); denies swelling, itching, pain, or numbness.  [name] [Ziprasidone Hcl] Itching and Swelling    Tongue swelling  . Keflex [Cephalexin] Swelling and Other (See Comments)    Tongue swelling. Can't talk    . Haldol [Haloperidol Lactate] Swelling    Tongue swelling  . Other Itching    wool    Medications: I have reviewed the patient's current medications. Results for orders placed or performed during the hospital encounter of 04/11/15 (from the past 48 hour(s))  CBC with Differential/Platelet     Status: Abnormal   Collection Time: 04/11/15  5:50 PM  Result Value Ref Range   WBC 6.4 4.0 - 10.5 K/uL   RBC 3.28 (L) 3.87 - 5.11 MIL/uL   Hemoglobin 9.2 (L) 12.0 - 15.0 g/dL   HCT 29.3 (L) 36.0 - 46.0 %   MCV 89.3 78.0 - 100.0 fL   MCH 28.0 26.0 - 34.0 pg   MCHC 31.4 30.0 - 36.0 g/dL   RDW 20.1 (H) 11.5 - 15.5 %   Platelets 398 150 - 400 K/uL   Neutrophils Relative % 70 %   Neutro Abs 4.5 1.7 - 7.7 K/uL   Lymphocytes Relative 21 %   Lymphs Abs 1.4 0.7 - 4.0 K/uL   Monocytes Relative 6 %   Monocytes Absolute 0.4 0.1 - 1.0 K/uL   Eosinophils Relative 3 %   Eosinophils Absolute 0.2 0.0 - 0.7 K/uL   Basophils Relative 0 %   Basophils Absolute 0.0 0.0 - 0.1 K/uL  I-stat chem 8, ed     Status: Abnormal  Collection Time: 04/11/15  5:50 PM  Result Value Ref Range   Sodium 137 135 - 145 mmol/L   Potassium 4.8 3.5 - 5.1 mmol/L   Chloride 98 (L) 101 - 111 mmol/L   BUN 51 (H) 6 - 20 mg/dL   Creatinine, Ser 13.40 (H) 0.44 - 1.00 mg/dL   Glucose, Bld 84 65 - 99 mg/dL   Calcium, Ion 1.09 (L) 1.12 - 1.23 mmol/L   TCO2 28 0 - 100 mmol/L   Hemoglobin 10.9 (L) 12.0 - 15.0 g/dL   HCT 32.0 (L) 36.0 - 46.0 %    Dg Chest 2 View  04/11/2015  CLINICAL DATA:  Two day history of midsternal chest pain. Productive cough. EXAM: CHEST  2 VIEW COMPARISON:  April 04, 2015 FINDINGS: Cardiomegaly is again noted. There are small pleural effusions bilaterally. No edema or consolidation. There is slight pulmonary venous hypertension. IMPRESSION: Evidence of pulmonary vascular congestion. Small pleural effusions bilaterally. No edema or consolidation. Electronically Signed   By: Lowella Grip III M.D.   On:  04/11/2015 18:33    Blood pressure 173/107, pulse 109, temperature 97.4 F (36.3 C), resp. rate 29, weight 64.864 kg (143 lb), SpO2 93 %. General appearance: alert and appears older than stated age Head: Normocephalic, without obvious abnormality, atraumatic Eyes: negative Neck: no adenopathy, no carotid bruit, no JVD, supple, symmetrical, trachea midline and thyroid not enlarged, symmetric, no tenderness/mass/nodules Back: symmetric, no curvature. ROM normal. No CVA tenderness. Resp: clear to auscultation bilaterally Chest wall: no tenderness Cardio: regular rate and rhythm, S1, S2 normal, no murmur, click, rub or gallop Extremities: extremities normal, atraumatic, no cyanosis or edema Skin: Skin color, texture, turgor normal. No rashes or lesions Lymph nodes: Cervical, supraclavicular, and axillary nodes normal. Neurologic: Grossly normal   LIN, JAMES W 04/11/2015, 8:47 PM
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
## 2015-04-11 NOTE — ED Notes (Signed)
Pt here for cough, dialysis. Last one was Saturday.

## 2015-04-11 NOTE — ED Notes (Signed)
Nephrology MD at bedside

## 2015-04-15 ENCOUNTER — Emergency Department (HOSPITAL_COMMUNITY)
Admission: EM | Admit: 2015-04-15 | Discharge: 2015-04-16 | Disposition: A | Discharge status: Home / Self Care | Payer: Medicare Other | Source: Home / Self Care | Attending: Emergency Medicine | Admitting: Emergency Medicine

## 2015-04-15 ENCOUNTER — Encounter (HOSPITAL_COMMUNITY): Payer: Self-pay | Admitting: Nurse Practitioner

## 2015-04-15 DIAGNOSIS — I5022 Chronic systolic (congestive) heart failure: Secondary | ICD-10-CM | POA: Diagnosis not present

## 2015-04-15 DIAGNOSIS — F17210 Nicotine dependence, cigarettes, uncomplicated: Secondary | ICD-10-CM | POA: Insufficient documentation

## 2015-04-15 DIAGNOSIS — N186 End stage renal disease: Secondary | ICD-10-CM | POA: Diagnosis present

## 2015-04-15 DIAGNOSIS — R000 Tachycardia, unspecified: Secondary | ICD-10-CM

## 2015-04-15 DIAGNOSIS — I509 Heart failure, unspecified: Secondary | ICD-10-CM | POA: Insufficient documentation

## 2015-04-15 DIAGNOSIS — Z862 Personal history of diseases of the blood and blood-forming organs and certain disorders involving the immune mechanism: Secondary | ICD-10-CM | POA: Insufficient documentation

## 2015-04-15 DIAGNOSIS — F329 Major depressive disorder, single episode, unspecified: Secondary | ICD-10-CM | POA: Insufficient documentation

## 2015-04-15 DIAGNOSIS — F209 Schizophrenia, unspecified: Secondary | ICD-10-CM | POA: Insufficient documentation

## 2015-04-15 DIAGNOSIS — I120 Hypertensive chronic kidney disease with stage 5 chronic kidney disease or end stage renal disease: Secondary | ICD-10-CM

## 2015-04-15 DIAGNOSIS — I959 Hypotension, unspecified: Secondary | ICD-10-CM | POA: Diagnosis not present

## 2015-04-15 DIAGNOSIS — Z992 Dependence on renal dialysis: Secondary | ICD-10-CM | POA: Insufficient documentation

## 2015-04-15 MED ORDER — ACETAMINOPHEN 325 MG PO TABS
650.0000 mg | ORAL_TABLET | Freq: Once | ORAL | Status: AC
  Administered 2015-04-15: 650 mg via ORAL
  Filled 2015-04-15: qty 2
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
## 2015-04-16 DIAGNOSIS — I120 Hypertensive chronic kidney disease with stage 5 chronic kidney disease or end stage renal disease: Secondary | ICD-10-CM | POA: Diagnosis not present

## 2015-04-16 LAB — RENAL FUNCTION PANEL
ALBUMIN: 2.9 g/dL — AB (ref 3.5–5.0)
ANION GAP: 21 — AB (ref 5–15)
BUN: 62 mg/dL — AB (ref 6–20)
CO2: 20 mmol/L — ABNORMAL LOW (ref 22–32)
Calcium: 9.5 mg/dL (ref 8.9–10.3)
Chloride: 96 mmol/L — ABNORMAL LOW (ref 101–111)
Creatinine, Ser: 14.96 mg/dL — ABNORMAL HIGH (ref 0.44–1.00)
GFR, EST AFRICAN AMERICAN: 3 mL/min — AB (ref >60)
GFR, EST NON AFRICAN AMERICAN: 3 mL/min — AB (ref >60)
Glucose, Bld: 87 mg/dL (ref 65–99)
PHOSPHORUS: 10.7 mg/dL — AB (ref 2.5–4.6)
POTASSIUM: 5.2 mmol/L — AB (ref 3.5–5.1)
Sodium: 137 mmol/L (ref 135–145)

## 2015-04-16 LAB — CBC
HEMATOCRIT: 27.2 % — AB (ref 36.0–46.0)
HEMOGLOBIN: 8.5 g/dL — AB (ref 12.0–15.0)
MCH: 27.7 pg (ref 26.0–34.0)
MCHC: 31.3 g/dL (ref 30.0–36.0)
MCV: 88.6 fL (ref 78.0–100.0)
Platelets: 291 10*3/uL (ref 150–400)
RBC: 3.07 MIL/uL — ABNORMAL LOW (ref 3.87–5.11)
RDW: 20.3 % — AB (ref 11.5–15.5)
WBC: 6.1 10*3/uL (ref 4.0–10.5)

## 2015-04-16 MED ORDER — LIDOCAINE HCL (PF) 1 % IJ SOLN: 5.0000 mL | INTRAMUSCULAR | Status: DC | PRN

## 2015-04-16 MED ORDER — ALTEPLASE 2 MG IJ SOLR
2.0000 mg | Freq: Once | INTRAMUSCULAR | Status: DC | PRN
  Filled 2015-04-16: qty 2

## 2015-04-16 MED ORDER — SODIUM CHLORIDE 0.9 % IV SOLN: 100.0000 mL | INTRAVENOUS | Status: DC | PRN

## 2015-04-16 MED ORDER — HEPARIN SODIUM (PORCINE) 1000 UNIT/ML DIALYSIS: 1000.0000 [IU] | INTRAMUSCULAR | Status: DC | PRN

## 2015-04-16 MED ORDER — PENTAFLUOROPROP-TETRAFLUOROETH EX AERO: 1.0000 "application " | INHALATION_SPRAY | CUTANEOUS | Status: DC | PRN

## 2015-04-16 MED ORDER — LIDOCAINE-PRILOCAINE 2.5-2.5 % EX CREA: 1.0000 "application " | TOPICAL_CREAM | CUTANEOUS | Status: DC | PRN

## 2015-04-18 ENCOUNTER — Emergency Department (HOSPITAL_COMMUNITY)
Admission: EM | Admit: 2015-04-18 | Discharge: 2015-04-18 | Disposition: A | Discharge status: Home / Self Care | Payer: Medicare Other | Source: Home / Self Care | Attending: Emergency Medicine | Admitting: Emergency Medicine

## 2015-04-18 ENCOUNTER — Encounter (HOSPITAL_COMMUNITY): Payer: Self-pay | Admitting: *Deleted

## 2015-04-18 DIAGNOSIS — I509 Heart failure, unspecified: Secondary | ICD-10-CM | POA: Insufficient documentation

## 2015-04-18 DIAGNOSIS — Z79899 Other long term (current) drug therapy: Secondary | ICD-10-CM

## 2015-04-18 DIAGNOSIS — G8929 Other chronic pain: Secondary | ICD-10-CM | POA: Insufficient documentation

## 2015-04-18 DIAGNOSIS — Z992 Dependence on renal dialysis: Secondary | ICD-10-CM | POA: Insufficient documentation

## 2015-04-18 DIAGNOSIS — Z862 Personal history of diseases of the blood and blood-forming organs and certain disorders involving the immune mechanism: Secondary | ICD-10-CM | POA: Insufficient documentation

## 2015-04-18 DIAGNOSIS — F17210 Nicotine dependence, cigarettes, uncomplicated: Secondary | ICD-10-CM | POA: Insufficient documentation

## 2015-04-18 DIAGNOSIS — R000 Tachycardia, unspecified: Secondary | ICD-10-CM

## 2015-04-18 DIAGNOSIS — Z7952 Long term (current) use of systemic steroids: Secondary | ICD-10-CM | POA: Insufficient documentation

## 2015-04-18 DIAGNOSIS — Z8659 Personal history of other mental and behavioral disorders: Secondary | ICD-10-CM

## 2015-04-18 DIAGNOSIS — N186 End stage renal disease: Secondary | ICD-10-CM

## 2015-04-18 DIAGNOSIS — N185 Chronic kidney disease, stage 5: Secondary | ICD-10-CM

## 2015-04-18 DIAGNOSIS — I120 Hypertensive chronic kidney disease with stage 5 chronic kidney disease or end stage renal disease: Secondary | ICD-10-CM

## 2015-04-18 MED ORDER — HYDROCODONE-ACETAMINOPHEN 5-325 MG PO TABS
1.0000 | ORAL_TABLET | ORAL | Status: AC
  Administered 2015-04-18: 1 via ORAL
  Filled 2015-04-18: qty 1

## 2015-04-18 MED ORDER — ALPRAZOLAM 0.25 MG PO TABS
0.5000 mg | ORAL_TABLET | Freq: Once | ORAL | Status: AC
Start: 2015-04-18 — End: 2015-04-18
  Administered 2015-04-18: 0.5 mg via ORAL
  Filled 2015-04-18: qty 2

## 2015-04-18 NOTE — ED Notes (Signed)
This RN spoke with dialysis states that they will not be ready for pt until later tonight.

## 2015-04-18 NOTE — Discharge Instructions (Signed)
Chronic Kidney Disease Chronic kidney disease happens when the kidneys are damaged over a long period. The kidneys are two organs that do many important jobs in the body. These jobs include:  Removing wastes and extra fluids from the blood.  Making hormones that help to keep the body healthy.  Making sure that the body has the right amount of fluids and chemicals. Chronic kidney disease may be caused by many things. The kidney damage occurs slowly. If too much damage occurs, the kidneys may stop working the way that they should. This is dangerous. Treatment can help to slow down the damage and keep it from getting worse. HOME CARE  Follow your diet as told by your doctor. You may need to limit the amount of salt (sodium) and protein that you eat each day.  Take medicines only as told by your doctor. Do not take any new medicines unless your doctor approves it.  Quit smoking if you smoke. Talk to your doctor about programs that may help you quit smoking.  Have your blood pressure checked regularly and keep track of the results.  Start or keep doing an exercise plan.  Get shots (immunizations) as told by your doctor.  Take vitamins and minerals as told by your doctor.  Keep all follow-up visits as told by your doctor. This is important. GET HELP RIGHT AWAY IF:   Your symptoms get worse.  You have new symptoms.  You have symptoms of end-stage kidney disease. These include:  Headaches.  Skin that is darker or lighter than normal.  Numbness in the hands or feet.  Easy bruising.  Frequent hiccups.  Stopping of menstrual periods in women.  You have a fever.  You are making very little pee (urine).  You have pain or bleeding when you pee.   This information is not intended to replace advice given to you by your health care provider. Make sure you discuss any questions you have with your health care provider.   Document Released: 06/18/2009 Document Revised: 12/13/2014  Document Reviewed: 11/21/2011 Elsevier Interactive Patient Education [employer].  Dialysis Dialysis is a procedure that replaces some of the work healthy kidneys do. It is done when you lose about 85-90% of your kidney function. It may also be done earlier if your symptoms may be improved by dialysis. During dialysis, wastes, salt, and extra water are removed from the blood, and the levels of certain chemicals in the blood (such as potassium) are maintained. Dialysis is done in sessions. Dialysis sessions are continued until the kidneys get better. If the kidneys cannot get better, such as in end-stage kidney disease, dialysis is continued for life or until you receive a new kidney (kidney transplant). There are two types of dialysis: hemodialysis and peritoneal dialysis. WHAT IS HEMODIALYSIS?  Hemodialysis is a type of dialysis in which a machine called a dialyzer is used to filter the blood. Before beginning hemodialysis, you will have surgery to create a site where blood can be removed from the body and returned to the body (vascular access). There are three types of vascular accesses:  Arteriovenous fistula. To create this type of access, an artery is connected to a vein (usually in the arm). A fistula takes 1-6 months to develop after surgery. If it develops properly, it usually lasts longer than the other types of vascular accesses. It is also less likely to become infected and cause blood clots.  Arteriovenous graft. To create this type of access, an artery and  a vein in the arm are connected with a tube. A graft may be used within 2-3 weeks of surgery.  A venous catheter. To create this type of access, a thin, flexible tube (catheter) is placed in a large vein in your neck, chest, or groin. A catheter may be used right away. It is usually used as a temporary access when dialysis needs to begin immediately. During hemodialysis, blood leaves the body through your access. It travels through  a tube to the dialyzer, where it is filtered. The blood then returns to your body through another tube. Hemodialysis is usually performed by a health care provider at a hospital or dialysis center three times a week. Visits last about 3-4 hours. It may also be performed with the help of another person at home with training.  WHAT IS PERITONEAL DIALYSIS? Peritoneal dialysis is a type of dialysis in which the thin lining of the abdomen (peritoneum) is used as a filter. Before beginning peritoneal dialysis, you will have surgery to place a catheter in your abdomen. The catheter will be used to transfer a fluid called dialysate to and from your abdomen. At the start of a session, your abdomen is filled with dialysate. During the session, wastes, salt, and extra water in the blood pass through the peritoneum and into the dialysate. The dialysate is drained from the body at the end of the session. The process of filling and draining the dialysate is called an exchange. Exchanges are repeated until you have used up all the dialysate for the day. Peritoneal dialysis may be performed by you at home or at almost any other location. It is done every day. You may need up to five exchanges a day. The amount of time the dialysate is in your body between exchanges is called a dwell. The dwell depends on the number of exchanges needed and the characteristics of the peritoneum. It usually varies from 1.5-3 hours. You may go about your day normally between exchanges. Alternately, the exchanges may be done at night while you sleep, using a machine called a cycler. WHICH TYPE OF DIALYSIS SHOULD I CHOOSE?  Both hemodialysis and peritoneal dialysis have advantages and disadvantages. Talk to your health care provider about which type of dialysis would be best for you. Your lifestyle and preferences should be considered along with your medical condition. In some cases, only one type of dialysis may be an option.  Advantages of  hemodialysis  It is done less often than peritoneal dialysis.  Someone else can do the dialysis for you.  If you go to a dialysis center, your health care provider will be able to recognize any problems right away.  If you go to a dialysis center, you can interact with others who are having dialysis. This can provide you with emotional support. Disadvantages of hemodialysis  Hemodialysis may cause cramps and low blood pressure. It may leave you feeling tired on the days you have the treatment.  If you go to a dialysis center, you will need to make weekly appointments and work around the center's schedule.  You will need to take extra care when traveling. If you go to a dialysis center, you will need to make special arrangements to visit a dialysis center near your destination. If you are having treatments at home, you will need to take the dialyzer with you to your destination.  You will need to avoid more foods than you would need to avoid on peritoneal dialysis. Advantages of peritoneal  dialysis  It is less likely than hemodialysis to cause cramps and low blood pressure.  You may do exchanges on your own wherever you are, including when you travel.  You do not need to avoid as many foods as you do on hemodialysis. Disadvantages of peritoneal dialysis  It is done more often than hemodialysis.  Performing peritoneal dialysis requires you to have dexterity of the hands. You must also be able to lift bags.  You will have to learn sterilization techniques. You will need to practice them every day to reduce the risk of infection. WHAT CHANGES WILL I NEED TO MAKE TO MY DIET DURING DIALYSIS? Both hemodialysis and peritoneal dialysis require you to make some changes to your diet. For example, you will need to limit your intake of foods high in the minerals phosphorus and potassium. You will also need to limit your fluid intake. Your dietitian can help you plan meals. A good meal plan can  improve your dialysis and your health.  WHAT SHOULD I EXPECT WHEN BEGINNING DIALYSIS? Adjusting to the dialysis treatment, schedule, and diet can take some time. You may need to stop working and may not be able to do some of the things you normally do. You may feel anxious or depressed when beginning dialysis. Eventually, many people feel better overall because of dialysis. Some people are able to return to work after making some changes, such as reducing work intensity. WHERE CAN I FIND MORE INFORMATION?   [place]: www.kidney.org  American Association of Kidney Patients: BombTimer.gl  [employer]: www.kidneyfund.org   This information is not intended to replace advice given to you by your health care provider. Make sure you discuss any questions you have with your health care provider.   Document Released: 06/14/2002 Document Revised: 04/14/2014 Document Reviewed: 05/18/2012 Elsevier Interactive Patient Education [employer].

## 2015-04-18 NOTE — ED Notes (Signed)
Dinner tray ordered.

## 2015-04-18 NOTE — Care Management (Signed)
Patient will not be dialyzed tonight, patient is scheduled for tomorrow morning at 6am. Patient given 2 bus passes to assist with transportation. Teach back done patient verbalized understanding of the importance of getting back to the hospital for HD in the morning. Patient given option to stay until HD patient desires leave and return in the am. No further ED CM needs identified.
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
## 2015-04-18 NOTE — ED Notes (Signed)
Spoke with dr. Tomi Bamberger, pt to come back in the morning for dialysis. Pt given bus pass by CM to assist her with transportation.

## 2015-04-18 NOTE — ED Notes (Signed)
Pt reports being here for dialysis today, last treatment was Sunday. No acute distress noted at triage.

## 2015-04-19 ENCOUNTER — Non-Acute Institutional Stay (HOSPITAL_COMMUNITY)
Admission: EM | Admit: 2015-04-19 | Discharge: 2015-04-19 | Disposition: A | Discharge status: Home / Self Care | Payer: Medicare Other | Attending: Emergency Medicine | Admitting: Emergency Medicine

## 2015-04-19 ENCOUNTER — Encounter (HOSPITAL_COMMUNITY): Payer: Self-pay

## 2015-04-19 DIAGNOSIS — I120 Hypertensive chronic kidney disease with stage 5 chronic kidney disease or end stage renal disease: Secondary | ICD-10-CM | POA: Diagnosis not present

## 2015-04-19 DIAGNOSIS — I5022 Chronic systolic (congestive) heart failure: Secondary | ICD-10-CM | POA: Insufficient documentation

## 2015-04-19 DIAGNOSIS — R000 Tachycardia, unspecified: Secondary | ICD-10-CM | POA: Insufficient documentation

## 2015-04-19 DIAGNOSIS — N186 End stage renal disease: Secondary | ICD-10-CM | POA: Diagnosis not present

## 2015-04-19 DIAGNOSIS — Z992 Dependence on renal dialysis: Secondary | ICD-10-CM | POA: Insufficient documentation

## 2015-04-19 DIAGNOSIS — I959 Hypotension, unspecified: Secondary | ICD-10-CM | POA: Insufficient documentation

## 2015-04-19 DIAGNOSIS — N19 Unspecified kidney failure: Secondary | ICD-10-CM | POA: Diagnosis present

## 2015-04-19 DIAGNOSIS — F17210 Nicotine dependence, cigarettes, uncomplicated: Secondary | ICD-10-CM | POA: Insufficient documentation

## 2015-04-19 LAB — CBC WITH DIFFERENTIAL/PLATELET
BASOS ABS: 0.1 10*3/uL (ref 0.0–0.1)
BASOS PCT: 1 %
EOS ABS: 0.1 10*3/uL (ref 0.0–0.7)
Eosinophils Relative: 2 %
HEMATOCRIT: 28.4 % — AB (ref 36.0–46.0)
HEMOGLOBIN: 9.1 g/dL — AB (ref 12.0–15.0)
Lymphocytes Relative: 27 %
Lymphs Abs: 1.5 10*3/uL (ref 0.7–4.0)
MCH: 28.2 pg (ref 26.0–34.0)
MCHC: 32 g/dL (ref 30.0–36.0)
MCV: 87.9 fL (ref 78.0–100.0)
MONOS PCT: 10 %
Monocytes Absolute: 0.6 10*3/uL (ref 0.1–1.0)
NEUTROS ABS: 3.4 10*3/uL (ref 1.7–7.7)
NEUTROS PCT: 61 %
Platelets: 236 10*3/uL (ref 150–400)
RBC: 3.23 MIL/uL — ABNORMAL LOW (ref 3.87–5.11)
RDW: 20.3 % — ABNORMAL HIGH (ref 11.5–15.5)
WBC: 5.6 10*3/uL (ref 4.0–10.5)

## 2015-04-19 LAB — RENAL FUNCTION PANEL
ALBUMIN: 2.9 g/dL — AB (ref 3.5–5.0)
ANION GAP: 20 — AB (ref 5–15)
BUN: 56 mg/dL — AB (ref 6–20)
CALCIUM: 9.6 mg/dL (ref 8.9–10.3)
CO2: 21 mmol/L — ABNORMAL LOW (ref 22–32)
Chloride: 96 mmol/L — ABNORMAL LOW (ref 101–111)
Creatinine, Ser: 14.85 mg/dL — ABNORMAL HIGH (ref 0.44–1.00)
GFR calc Af Amer: 3 mL/min — ABNORMAL LOW (ref >60)
GFR, EST NON AFRICAN AMERICAN: 3 mL/min — AB (ref >60)
GLUCOSE: 107 mg/dL — AB (ref 65–99)
PHOSPHORUS: 10.9 mg/dL — AB (ref 2.5–4.6)
Potassium: 5 mmol/L (ref 3.5–5.1)
SODIUM: 137 mmol/L (ref 135–145)

## 2015-04-19 MED ORDER — HEPARIN SODIUM (PORCINE) 1000 UNIT/ML DIALYSIS
1000.0000 [IU] | INTRAMUSCULAR | Status: DC | PRN
  Filled 2015-04-19: qty 1

## 2015-04-19 MED ORDER — HEPARIN SODIUM (PORCINE) 1000 UNIT/ML DIALYSIS
5000.0000 [IU] | Freq: Once | INTRAMUSCULAR | Status: DC
  Filled 2015-04-19: qty 5

## 2015-04-19 MED ORDER — SODIUM CHLORIDE 0.9 % IV SOLN: 100.0000 mL | INTRAVENOUS | Status: DC | PRN

## 2015-04-19 MED ORDER — PENTAFLUOROPROP-TETRAFLUOROETH EX AERO
1.0000 | INHALATION_SPRAY | CUTANEOUS | Status: DC | PRN
Start: 2015-04-19 — End: 2015-04-20
  Filled 2015-04-19: qty 30

## 2015-04-19 MED ORDER — LIDOCAINE-PRILOCAINE 2.5-2.5 % EX CREA
1.0000 "application " | TOPICAL_CREAM | CUTANEOUS | Status: DC | PRN
  Filled 2015-04-19: qty 5

## 2015-04-19 MED ORDER — LIDOCAINE HCL (PF) 1 % IJ SOLN: 5.0000 mL | INTRAMUSCULAR | Status: DC | PRN

## 2015-04-19 MED ORDER — ALTEPLASE 2 MG IJ SOLR
2.0000 mg | Freq: Once | INTRAMUSCULAR | Status: DC | PRN
  Filled 2015-04-19: qty 2

## 2015-04-19 NOTE — ED Notes (Signed)
Requested pt return to lobby to wait for dialysis once again. Pt states she wants her breakfast tray.

## 2015-04-19 NOTE — ED Notes (Signed)
Pt asked to get dressed and return to waiting room to wait for dialysis. Pt seen this morning and medically cleared. Dialysis consult made.
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
## 2015-04-19 NOTE — ED Notes (Signed)
Called  Dialysis spoke with Adela Lank will let charge nurse know she is here

## 2015-04-19 NOTE — ED Notes (Signed)
Pt here for dialysis. Also states she is having joint and lower back pain that started earlier this morning. She was here yesterday and given something for it that helped and was told to come back this morning.

## 2015-04-19 NOTE — Progress Notes (Signed)
Patient  Discharged home left with belongings, signed self off treatment 40 miins early. Removed 3875 of 5500 ml goal. Received 3 hours and 20 mins of 4 hour treatment.

## 2015-04-19 NOTE — Procedures (Signed)
No distress. No sob or cough. Labs pedning. HD today w max UF.   I was present at this dialysis session, have reviewed the session itself and made  appropriate changes Kelly Splinter MD [place] pager [phone]    cell [phone] 04/19/2015, 3:44 PM

## 2015-04-21 ENCOUNTER — Non-Acute Institutional Stay (HOSPITAL_COMMUNITY)
Admission: EM | Admit: 2015-04-21 | Discharge: 2015-04-22 | Disposition: A | Discharge status: Home / Self Care | Payer: Medicare Other | Source: Home / Self Care | Attending: Emergency Medicine | Admitting: Emergency Medicine

## 2015-04-21 ENCOUNTER — Encounter (HOSPITAL_COMMUNITY): Payer: Self-pay | Admitting: Emergency Medicine

## 2015-04-21 DIAGNOSIS — N186 End stage renal disease: Secondary | ICD-10-CM

## 2015-04-21 DIAGNOSIS — R000 Tachycardia, unspecified: Secondary | ICD-10-CM

## 2015-04-21 DIAGNOSIS — F17210 Nicotine dependence, cigarettes, uncomplicated: Secondary | ICD-10-CM | POA: Insufficient documentation

## 2015-04-21 DIAGNOSIS — I120 Hypertensive chronic kidney disease with stage 5 chronic kidney disease or end stage renal disease: Secondary | ICD-10-CM | POA: Insufficient documentation

## 2015-04-21 DIAGNOSIS — M329 Systemic lupus erythematosus, unspecified: Secondary | ICD-10-CM | POA: Insufficient documentation

## 2015-04-21 DIAGNOSIS — I5022 Chronic systolic (congestive) heart failure: Secondary | ICD-10-CM

## 2015-04-21 DIAGNOSIS — N189 Chronic kidney disease, unspecified: Secondary | ICD-10-CM | POA: Diagnosis present

## 2015-04-21 DIAGNOSIS — Z992 Dependence on renal dialysis: Secondary | ICD-10-CM

## 2015-04-21 LAB — RENAL FUNCTION PANEL
ALBUMIN: 2.9 g/dL — AB (ref 3.5–5.0)
ANION GAP: 16 — AB (ref 5–15)
BUN: 45 mg/dL — ABNORMAL HIGH (ref 6–20)
CALCIUM: 9.2 mg/dL (ref 8.9–10.3)
CO2: 24 mmol/L (ref 22–32)
CREATININE: 12.26 mg/dL — AB (ref 0.44–1.00)
Chloride: 99 mmol/L — ABNORMAL LOW (ref 101–111)
GFR, EST AFRICAN AMERICAN: 4 mL/min — AB (ref >60)
GFR, EST NON AFRICAN AMERICAN: 4 mL/min — AB (ref >60)
Glucose, Bld: 93 mg/dL (ref 65–99)
PHOSPHORUS: 10.3 mg/dL — AB (ref 2.5–4.6)
Potassium: 4.6 mmol/L (ref 3.5–5.1)
SODIUM: 139 mmol/L (ref 135–145)

## 2015-04-21 LAB — CBC WITH DIFFERENTIAL/PLATELET
BASOS ABS: 0 10*3/uL (ref 0.0–0.1)
BASOS PCT: 1 %
EOS ABS: 0.2 10*3/uL (ref 0.0–0.7)
EOS PCT: 3 %
HEMATOCRIT: 28 % — AB (ref 36.0–46.0)
Hemoglobin: 8.7 g/dL — ABNORMAL LOW (ref 12.0–15.0)
Lymphocytes Relative: 24 %
Lymphs Abs: 1.5 10*3/uL (ref 0.7–4.0)
MCH: 27.5 pg (ref 26.0–34.0)
MCHC: 31.1 g/dL (ref 30.0–36.0)
MCV: 88.6 fL (ref 78.0–100.0)
MONO ABS: 0.5 10*3/uL (ref 0.1–1.0)
MONOS PCT: 8 %
Neutro Abs: 4 10*3/uL (ref 1.7–7.7)
Neutrophils Relative %: 64 %
PLATELETS: 181 10*3/uL (ref 150–400)
RBC: 3.16 MIL/uL — ABNORMAL LOW (ref 3.87–5.11)
RDW: 20.3 % — AB (ref 11.5–15.5)
WBC: 6.2 10*3/uL (ref 4.0–10.5)

## 2015-04-21 NOTE — Discharge Instructions (Signed)
Dialysis °Dialysis is a procedure that replaces some of the work healthy kidneys do. It is done when you lose about 85-90% of your kidney function. It may also be done earlier if your symptoms may be improved by dialysis. During dialysis, wastes, salt, and extra water are removed from the blood, and the levels of certain chemicals in the blood (such as potassium) are maintained. Dialysis is done in sessions. Dialysis sessions are continued until the kidneys get better. If the kidneys cannot get better, such as in end-stage kidney disease, dialysis is continued for life or until you receive a new kidney (kidney transplant). There are two types of dialysis: hemodialysis and peritoneal dialysis. °WHAT IS HEMODIALYSIS?  °Hemodialysis is a type of dialysis in which a machine called a dialyzer is used to filter the blood. Before beginning hemodialysis, you will have surgery to create a site where blood can be removed from the body and returned to the body (vascular access). There are three types of vascular accesses: °· Arteriovenous fistula. To create this type of access, an artery is connected to a vein (usually in the arm). A fistula takes 1-6 months to develop after surgery. If it develops properly, it usually lasts longer than the other types of vascular accesses. It is also less likely to become infected and cause blood clots. °· Arteriovenous graft. To create this type of access, an artery and a vein in the arm are connected with a tube. A graft may be used within 2-3 weeks of surgery. °· A venous catheter. To create this type of access, a thin, flexible tube (catheter) is placed in a large vein in your neck, chest, or groin. A catheter may be used right away. It is usually used as a temporary access when dialysis needs to begin immediately. °During hemodialysis, blood leaves the body through your access. It travels through a tube to the dialyzer, where it is filtered. The blood then returns to your body through  another tube. °Hemodialysis is usually performed by a health care provider at a hospital or dialysis center three times a week. Visits last about 3-4 hours. It may also be performed with the help of another person at home with training.  °WHAT IS PERITONEAL DIALYSIS? °Peritoneal dialysis is a type of dialysis in which the thin lining of the abdomen (peritoneum) is used as a filter. Before beginning peritoneal dialysis, you will have surgery to place a catheter in your abdomen. The catheter will be used to transfer a fluid called dialysate to and from your abdomen. At the start of a session, your abdomen is filled with dialysate. During the session, wastes, salt, and extra water in the blood pass through the peritoneum and into the dialysate. The dialysate is drained from the body at the end of the session. The process of filling and draining the dialysate is called an exchange. Exchanges are repeated until you have used up all the dialysate for the day. °Peritoneal dialysis may be performed by you at home or at almost any other location. It is done every day. You may need up to five exchanges a day. The amount of time the dialysate is in your body between exchanges is called a dwell. The dwell depends on the number of exchanges needed and the characteristics of the peritoneum. It usually varies from 1.5-3 hours. You may go about your day normally between exchanges. Alternately, the exchanges may be done at night while you sleep, using a machine called a cycler. °WHICH TYPE   OF DIALYSIS SHOULD I CHOOSE?  °Both hemodialysis and peritoneal dialysis have advantages and disadvantages. Talk to your health care provider about which type of dialysis would be best for you. Your lifestyle and preferences should be considered along with your medical condition. In some cases, only one type of dialysis may be an option.  °Advantages of hemodialysis °· It is done less often than peritoneal dialysis. °· Someone else can do the  dialysis for you. °· If you go to a dialysis center, your health care provider will be able to recognize any problems right away. °· If you go to a dialysis center, you can interact with others who are having dialysis. This can provide you with emotional support. °Disadvantages of hemodialysis °· Hemodialysis may cause cramps and low blood pressure. It may leave you feeling tired on the days you have the treatment. °· If you go to a dialysis center, you will need to make weekly appointments and work around the center's schedule. °· You will need to take extra care when traveling. If you go to a dialysis center, you will need to make special arrangements to visit a dialysis center near your destination. If you are having treatments at home, you will need to take the dialyzer with you to your destination. °· You will need to avoid more foods than you would need to avoid on peritoneal dialysis. °Advantages of peritoneal dialysis °· It is less likely than hemodialysis to cause cramps and low blood pressure. °· You may do exchanges on your own wherever you are, including when you travel. °· You do not need to avoid as many foods as you do on hemodialysis. °Disadvantages of peritoneal dialysis °· It is done more often than hemodialysis. °· Performing peritoneal dialysis requires you to have dexterity of the hands. You must also be able to lift bags. °· You will have to learn sterilization techniques. You will need to practice them every day to reduce the risk of infection.  °WHAT CHANGES WILL I NEED TO MAKE TO MY DIET DURING DIALYSIS? °Both hemodialysis and peritoneal dialysis require you to make some changes to your diet. For example, you will need to limit your intake of foods high in the minerals phosphorus and potassium. You will also need to limit your fluid intake. Your dietitian can help you plan meals. A good meal plan can improve your dialysis and your health.  °WHAT SHOULD I EXPECT WHEN BEGINNING  DIALYSIS? °Adjusting to the dialysis treatment, schedule, and diet can take some time. You may need to stop working and may not be able to do some of the things you normally do. You may feel anxious or depressed when beginning dialysis. Eventually, many people feel better overall because of dialysis. Some people are able to return to work after making some changes, such as reducing work intensity. °WHERE CAN I FIND MORE INFORMATION?  °· National Kidney Foundation: www.kidney.org °· American Association of Kidney Patients: www.aakp.org °· American Kidney Fund: www.kidneyfund.org °  °This information is not intended to replace advice given to you by your health care provider. Make sure you discuss any questions you have with your health care provider. °  °Document Released: 06/14/2002 Document Revised: 04/14/2014 Document Reviewed: 05/18/2012 °Elsevier Interactive Patient Education ©2016 [employer] ° °

## 2015-04-21 NOTE — ED Notes (Signed)
Pt here for regular dialysis, pt reports she has spoke to dialysis and they will be ready around 700.
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
## 2015-04-22 MED ORDER — SODIUM CHLORIDE 0.9 % IV SOLN: 100.0000 mL | INTRAVENOUS | Status: DC | PRN

## 2015-04-22 MED ORDER — HEPARIN SODIUM (PORCINE) 1000 UNIT/ML DIALYSIS: 4000.0000 [IU] | INTRAMUSCULAR | Status: DC | PRN

## 2015-04-22 MED ORDER — HEPARIN SODIUM (PORCINE) 1000 UNIT/ML DIALYSIS: 1000.0000 [IU] | INTRAMUSCULAR | Status: DC | PRN

## 2015-04-22 MED ORDER — PENTAFLUOROPROP-TETRAFLUOROETH EX AERO: 1.0000 "application " | INHALATION_SPRAY | CUTANEOUS | Status: DC | PRN

## 2015-04-24 ENCOUNTER — Emergency Department (HOSPITAL_COMMUNITY)
Admission: EM | Admit: 2015-04-24 | Discharge: 2015-04-24 | Disposition: A | Discharge status: Home / Self Care | Payer: Medicare Other | Attending: Emergency Medicine | Admitting: Emergency Medicine

## 2015-04-24 ENCOUNTER — Encounter (HOSPITAL_COMMUNITY): Payer: Self-pay | Admitting: Emergency Medicine

## 2015-04-24 ENCOUNTER — Emergency Department (HOSPITAL_COMMUNITY): Payer: Medicare Other

## 2015-04-24 ENCOUNTER — Telehealth: Payer: Self-pay | Admitting: *Deleted

## 2015-04-24 DIAGNOSIS — J810 Acute pulmonary edema: Secondary | ICD-10-CM

## 2015-04-24 DIAGNOSIS — Z452 Encounter for adjustment and management of vascular access device: Secondary | ICD-10-CM | POA: Diagnosis present

## 2015-04-24 DIAGNOSIS — Z862 Personal history of diseases of the blood and blood-forming organs and certain disorders involving the immune mechanism: Secondary | ICD-10-CM | POA: Diagnosis not present

## 2015-04-24 DIAGNOSIS — M25551 Pain in right hip: Secondary | ICD-10-CM | POA: Diagnosis not present

## 2015-04-24 DIAGNOSIS — F17210 Nicotine dependence, cigarettes, uncomplicated: Secondary | ICD-10-CM | POA: Insufficient documentation

## 2015-04-24 DIAGNOSIS — M25561 Pain in right knee: Secondary | ICD-10-CM | POA: Diagnosis not present

## 2015-04-24 DIAGNOSIS — Z8659 Personal history of other mental and behavioral disorders: Secondary | ICD-10-CM | POA: Diagnosis not present

## 2015-04-24 DIAGNOSIS — I120 Hypertensive chronic kidney disease with stage 5 chronic kidney disease or end stage renal disease: Secondary | ICD-10-CM | POA: Insufficient documentation

## 2015-04-24 DIAGNOSIS — R05 Cough: Secondary | ICD-10-CM | POA: Diagnosis not present

## 2015-04-24 DIAGNOSIS — N186 End stage renal disease: Secondary | ICD-10-CM | POA: Diagnosis not present

## 2015-04-24 DIAGNOSIS — Z992 Dependence on renal dialysis: Secondary | ICD-10-CM | POA: Diagnosis not present

## 2015-04-24 DIAGNOSIS — I509 Heart failure, unspecified: Secondary | ICD-10-CM | POA: Insufficient documentation

## 2015-04-24 DIAGNOSIS — Z79899 Other long term (current) drug therapy: Secondary | ICD-10-CM | POA: Insufficient documentation

## 2015-04-24 DIAGNOSIS — R0602 Shortness of breath: Secondary | ICD-10-CM | POA: Diagnosis not present

## 2015-04-24 DIAGNOSIS — R000 Tachycardia, unspecified: Secondary | ICD-10-CM | POA: Diagnosis not present

## 2015-04-24 MED ORDER — LIDOCAINE-PRILOCAINE 2.5-2.5 % EX CREA: 1.0000 "application " | TOPICAL_CREAM | CUTANEOUS | Status: DC | PRN

## 2015-04-24 MED ORDER — LIDOCAINE HCL (PF) 1 % IJ SOLN: 5.0000 mL | INTRAMUSCULAR | Status: DC | PRN

## 2015-04-24 MED ORDER — SODIUM CHLORIDE 0.9 % IV SOLN: 100.0000 mL | INTRAVENOUS | Status: DC | PRN

## 2015-04-24 MED ORDER — HEPARIN SODIUM (PORCINE) 1000 UNIT/ML DIALYSIS: 1000.0000 [IU] | INTRAMUSCULAR | Status: DC | PRN

## 2015-04-24 MED ORDER — PENTAFLUOROPROP-TETRAFLUOROETH EX AERO: 1.0000 "application " | INHALATION_SPRAY | CUTANEOUS | Status: DC | PRN

## 2015-04-24 MED ORDER — ALTEPLASE 2 MG IJ SOLR: 2.0000 mg | Freq: Once | INTRAMUSCULAR | Status: DC | PRN

## 2015-04-24 NOTE — ED Notes (Signed)
MD at bedside. 

## 2015-04-24 NOTE — ED Notes (Signed)
Dialysis called stating they were ready for patient, Dr Regenia Skeeter advised patient can go to dialysis at this time.

## 2015-04-24 NOTE — ED Notes (Signed)
Pt here for dialysis today; last dialysis was Saturday; pt sts some right hip pain
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
## 2015-04-24 NOTE — ED Notes (Signed)
Patient complaining of shortness of breath, reassessed in triage with recheck of vitals. No distress

## 2015-04-24 NOTE — Telephone Encounter (Signed)
Called patient to offer flu vaccine, however, VM picked up.  Left message on patient's voice mail to return call. DUCATTE, LAURENZE L, RN   

## 2015-04-26 ENCOUNTER — Non-Acute Institutional Stay (HOSPITAL_COMMUNITY)
Admission: EM | Admit: 2015-04-26 | Discharge: 2015-04-26 | Disposition: A | Discharge status: Home / Self Care | Payer: Medicare Other | Source: Home / Self Care | Attending: Emergency Medicine | Admitting: Emergency Medicine

## 2015-04-26 ENCOUNTER — Encounter (HOSPITAL_COMMUNITY): Payer: Self-pay

## 2015-04-26 DIAGNOSIS — F319 Bipolar disorder, unspecified: Secondary | ICD-10-CM | POA: Insufficient documentation

## 2015-04-26 DIAGNOSIS — Z992 Dependence on renal dialysis: Secondary | ICD-10-CM

## 2015-04-26 DIAGNOSIS — N186 End stage renal disease: Secondary | ICD-10-CM | POA: Diagnosis present

## 2015-04-26 DIAGNOSIS — I509 Heart failure, unspecified: Secondary | ICD-10-CM

## 2015-04-26 DIAGNOSIS — F209 Schizophrenia, unspecified: Secondary | ICD-10-CM | POA: Insufficient documentation

## 2015-04-26 DIAGNOSIS — F17210 Nicotine dependence, cigarettes, uncomplicated: Secondary | ICD-10-CM

## 2015-04-26 DIAGNOSIS — Z79899 Other long term (current) drug therapy: Secondary | ICD-10-CM | POA: Insufficient documentation

## 2015-04-26 DIAGNOSIS — I120 Hypertensive chronic kidney disease with stage 5 chronic kidney disease or end stage renal disease: Secondary | ICD-10-CM | POA: Diagnosis not present

## 2015-04-26 DIAGNOSIS — I132 Hypertensive heart and chronic kidney disease with heart failure and with stage 5 chronic kidney disease, or end stage renal disease: Secondary | ICD-10-CM

## 2015-04-26 DIAGNOSIS — M3214 Glomerular disease in systemic lupus erythematosus: Secondary | ICD-10-CM

## 2015-04-26 LAB — RENAL FUNCTION PANEL
ANION GAP: 15 (ref 5–15)
Albumin: 2.7 g/dL — ABNORMAL LOW (ref 3.5–5.0)
BUN: 52 mg/dL — ABNORMAL HIGH (ref 6–20)
CALCIUM: 9.2 mg/dL (ref 8.9–10.3)
CHLORIDE: 100 mmol/L — AB (ref 101–111)
CO2: 24 mmol/L (ref 22–32)
Creatinine, Ser: 11.83 mg/dL — ABNORMAL HIGH (ref 0.44–1.00)
GFR calc non Af Amer: 4 mL/min — ABNORMAL LOW (ref >60)
GFR, EST AFRICAN AMERICAN: 4 mL/min — AB (ref >60)
Glucose, Bld: 119 mg/dL — ABNORMAL HIGH (ref 65–99)
POTASSIUM: 5.5 mmol/L — AB (ref 3.5–5.1)
Phosphorus: 10.9 mg/dL — ABNORMAL HIGH (ref 2.5–4.6)
SODIUM: 139 mmol/L (ref 135–145)

## 2015-04-26 LAB — CBC
HEMATOCRIT: 26.4 % — AB (ref 36.0–46.0)
HEMOGLOBIN: 8.2 g/dL — AB (ref 12.0–15.0)
MCH: 27 pg (ref 26.0–34.0)
MCHC: 31.1 g/dL (ref 30.0–36.0)
MCV: 86.8 fL (ref 78.0–100.0)
Platelets: 195 10*3/uL (ref 150–400)
RBC: 3.04 MIL/uL — AB (ref 3.87–5.11)
RDW: 20.1 % — ABNORMAL HIGH (ref 11.5–15.5)
WBC: 5.6 10*3/uL (ref 4.0–10.5)

## 2015-04-26 MED ORDER — ALTEPLASE 2 MG IJ SOLR: 2.0000 mg | Freq: Once | INTRAMUSCULAR | Status: DC | PRN

## 2015-04-26 MED ORDER — PENTAFLUOROPROP-TETRAFLUOROETH EX AERO: 1.0000 "application " | INHALATION_SPRAY | CUTANEOUS | Status: DC | PRN

## 2015-04-26 MED ORDER — SODIUM CHLORIDE 0.9 % IV SOLN: 100.0000 mL | INTRAVENOUS | Status: DC | PRN

## 2015-04-26 MED ORDER — LIDOCAINE HCL (PF) 1 % IJ SOLN
5.0000 mL | INTRAMUSCULAR | Status: DC | PRN
Start: 2015-04-26 — End: 2015-04-27

## 2015-04-26 MED ORDER — LIDOCAINE-PRILOCAINE 2.5-2.5 % EX CREA: 1.0000 "application " | TOPICAL_CREAM | CUTANEOUS | Status: DC | PRN

## 2015-04-26 MED ORDER — HEPARIN SODIUM (PORCINE) 1000 UNIT/ML DIALYSIS: 20.0000 [IU]/kg | INTRAMUSCULAR | Status: DC | PRN

## 2015-04-26 MED ORDER — HEPARIN SODIUM (PORCINE) 1000 UNIT/ML DIALYSIS: 1000.0000 [IU] | INTRAMUSCULAR | Status: DC | PRN
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
## 2015-04-26 NOTE — ED Notes (Signed)
Ordered pt meal tray 

## 2015-04-26 NOTE — ED Notes (Signed)
Gave report to dialysis. Pt transporting to dialysis

## 2015-04-26 NOTE — ED Notes (Signed)
Due for dialysis today, last treatment Tuesday, no distress

## 2015-04-29 ENCOUNTER — Encounter: Payer: Self-pay | Admitting: Nephrology

## 2015-05-01 ENCOUNTER — Encounter (HOSPITAL_COMMUNITY): Payer: Self-pay | Admitting: Emergency Medicine

## 2015-05-01 ENCOUNTER — Emergency Department (HOSPITAL_COMMUNITY)
Admission: EM | Admit: 2015-05-01 | Discharge: 2015-05-02 | Disposition: A | Discharge status: Home / Self Care | Payer: Medicare Other | Attending: Emergency Medicine | Admitting: Emergency Medicine

## 2015-05-01 DIAGNOSIS — Z862 Personal history of diseases of the blood and blood-forming organs and certain disorders involving the immune mechanism: Secondary | ICD-10-CM | POA: Insufficient documentation

## 2015-05-01 DIAGNOSIS — Z8659 Personal history of other mental and behavioral disorders: Secondary | ICD-10-CM | POA: Diagnosis not present

## 2015-05-01 DIAGNOSIS — I509 Heart failure, unspecified: Secondary | ICD-10-CM | POA: Insufficient documentation

## 2015-05-01 DIAGNOSIS — I120 Hypertensive chronic kidney disease with stage 5 chronic kidney disease or end stage renal disease: Secondary | ICD-10-CM | POA: Diagnosis not present

## 2015-05-01 DIAGNOSIS — Z8739 Personal history of other diseases of the musculoskeletal system and connective tissue: Secondary | ICD-10-CM | POA: Insufficient documentation

## 2015-05-01 DIAGNOSIS — N186 End stage renal disease: Secondary | ICD-10-CM | POA: Insufficient documentation

## 2015-05-01 DIAGNOSIS — Z79899 Other long term (current) drug therapy: Secondary | ICD-10-CM | POA: Diagnosis not present

## 2015-05-01 DIAGNOSIS — Z992 Dependence on renal dialysis: Secondary | ICD-10-CM | POA: Diagnosis not present

## 2015-05-01 DIAGNOSIS — F17210 Nicotine dependence, cigarettes, uncomplicated: Secondary | ICD-10-CM | POA: Insufficient documentation

## 2015-05-01 DIAGNOSIS — Z452 Encounter for adjustment and management of vascular access device: Secondary | ICD-10-CM | POA: Diagnosis present

## 2015-05-01 DIAGNOSIS — R000 Tachycardia, unspecified: Secondary | ICD-10-CM | POA: Insufficient documentation

## 2015-05-01 LAB — I-STAT CHEM 8, ED
BUN: 110 mg/dL — AB (ref 6–20)
CALCIUM ION: 1.13 mmol/L (ref 1.12–1.23)
CHLORIDE: 101 mmol/L (ref 101–111)
Creatinine, Ser: 15.6 mg/dL — ABNORMAL HIGH (ref 0.44–1.00)
Glucose, Bld: 89 mg/dL (ref 65–99)
HCT: 30 % — ABNORMAL LOW (ref 36.0–46.0)
Hemoglobin: 10.2 g/dL — ABNORMAL LOW (ref 12.0–15.0)
POTASSIUM: 5.9 mmol/L — AB (ref 3.5–5.1)
SODIUM: 133 mmol/L — AB (ref 135–145)
TCO2: 20 mmol/L (ref 0–100)

## 2015-05-01 MED ORDER — SODIUM CHLORIDE 0.9 % IV SOLN
125.0000 mg | INTRAVENOUS | Status: DC
  Filled 2015-05-01: qty 10

## 2015-05-01 MED ORDER — DARBEPOETIN ALFA 100 MCG/0.5ML IJ SOSY
100.0000 ug | PREFILLED_SYRINGE | INTRAMUSCULAR | Status: DC
  Administered 2015-05-01: 100 ug via INTRAVENOUS

## 2015-05-01 MED ORDER — DARBEPOETIN ALFA 100 MCG/0.5ML IJ SOSY
PREFILLED_SYRINGE | INTRAMUSCULAR | Status: AC
  Administered 2015-05-01: 100 ug via INTRAVENOUS
  Filled 2015-05-01: qty 0.5
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
## 2015-05-01 NOTE — ED Notes (Signed)
Patient states needs dialysis today.  Patient states has not had dialysis since Thursday.   Patient states that she spoke with dialysis earlier and they stated they would call for her in an hour or two.

## 2015-05-01 NOTE — Progress Notes (Signed)
tx stopped with 1.75 hrs left; pt cramping and stopped the tx; advised pt her blood pressures are still too high and to finish the tx; pt became irritated. Assisted pt with stretching the cramps out of her legs but still insisted on coming off the machine. Pt ambulated off the floor cheerfully bidding [place] all farewell to go home. early termination form signed. Advised pt to take her BP meds when she gets home d/t SBP>170.
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
## 2015-05-01 NOTE — ED Notes (Signed)
Dialysis aware patient is here.

## 2015-05-03 ENCOUNTER — Encounter (HOSPITAL_COMMUNITY): Payer: Self-pay | Admitting: Emergency Medicine

## 2015-05-03 ENCOUNTER — Emergency Department (HOSPITAL_COMMUNITY)
Admission: EM | Admit: 2015-05-03 | Discharge: 2015-05-04 | Disposition: A | Discharge status: Home / Self Care | Payer: Medicare Other | Attending: Nephrology | Admitting: Nephrology

## 2015-05-03 DIAGNOSIS — Z8659 Personal history of other mental and behavioral disorders: Secondary | ICD-10-CM | POA: Diagnosis not present

## 2015-05-03 DIAGNOSIS — I509 Heart failure, unspecified: Secondary | ICD-10-CM | POA: Diagnosis not present

## 2015-05-03 DIAGNOSIS — Z79899 Other long term (current) drug therapy: Secondary | ICD-10-CM | POA: Diagnosis not present

## 2015-05-03 DIAGNOSIS — Z8739 Personal history of other diseases of the musculoskeletal system and connective tissue: Secondary | ICD-10-CM | POA: Insufficient documentation

## 2015-05-03 DIAGNOSIS — F17210 Nicotine dependence, cigarettes, uncomplicated: Secondary | ICD-10-CM | POA: Diagnosis not present

## 2015-05-03 DIAGNOSIS — Z992 Dependence on renal dialysis: Secondary | ICD-10-CM | POA: Diagnosis not present

## 2015-05-03 DIAGNOSIS — N186 End stage renal disease: Secondary | ICD-10-CM | POA: Diagnosis not present

## 2015-05-03 DIAGNOSIS — Z862 Personal history of diseases of the blood and blood-forming organs and certain disorders involving the immune mechanism: Secondary | ICD-10-CM | POA: Insufficient documentation

## 2015-05-03 DIAGNOSIS — I120 Hypertensive chronic kidney disease with stage 5 chronic kidney disease or end stage renal disease: Secondary | ICD-10-CM | POA: Diagnosis not present

## 2015-05-03 DIAGNOSIS — R000 Tachycardia, unspecified: Secondary | ICD-10-CM | POA: Insufficient documentation

## 2015-05-03 DIAGNOSIS — I10 Essential (primary) hypertension: Secondary | ICD-10-CM

## 2015-05-03 DIAGNOSIS — Z452 Encounter for adjustment and management of vascular access device: Secondary | ICD-10-CM | POA: Diagnosis present

## 2015-05-03 DIAGNOSIS — G8929 Other chronic pain: Secondary | ICD-10-CM | POA: Diagnosis not present

## 2015-05-03 LAB — RENAL FUNCTION PANEL
ALBUMIN: 2.8 g/dL — AB (ref 3.5–5.0)
ANION GAP: 22 — AB (ref 5–15)
BUN: 96 mg/dL — ABNORMAL HIGH (ref 6–20)
CHLORIDE: 96 mmol/L — AB (ref 101–111)
CO2: 18 mmol/L — ABNORMAL LOW (ref 22–32)
Calcium: 9.7 mg/dL (ref 8.9–10.3)
Creatinine, Ser: 14.19 mg/dL — ABNORMAL HIGH (ref 0.44–1.00)
GFR, EST AFRICAN AMERICAN: 3 mL/min — AB (ref >60)
GFR, EST NON AFRICAN AMERICAN: 3 mL/min — AB (ref >60)
Glucose, Bld: 111 mg/dL — ABNORMAL HIGH (ref 65–99)
PHOSPHORUS: 11.8 mg/dL — AB (ref 2.5–4.6)
POTASSIUM: 6 mmol/L — AB (ref 3.5–5.1)
Sodium: 136 mmol/L (ref 135–145)

## 2015-05-03 MED ORDER — HYDROXYZINE HCL 25 MG PO TABS
ORAL_TABLET | ORAL | Status: AC
  Administered 2015-05-03: 25 mg via ORAL
  Filled 2015-05-03: qty 1

## 2015-05-03 MED ORDER — HYDROXYZINE HCL 25 MG PO TABS
25.0000 mg | ORAL_TABLET | Freq: Once | ORAL | Status: AC
  Administered 2015-05-03: 25 mg via ORAL

## 2015-05-03 MED ORDER — ACETAMINOPHEN 325 MG PO TABS
ORAL_TABLET | ORAL | Status: AC
  Administered 2015-05-03: 650 mg via ORAL
  Filled 2015-05-03: qty 2

## 2015-05-03 MED ORDER — ACETAMINOPHEN 325 MG PO TABS
650.0000 mg | ORAL_TABLET | Freq: Once | ORAL | Status: AC
  Administered 2015-05-03: 650 mg via ORAL
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
## 2015-05-03 NOTE — ED Notes (Signed)
Dinner tray given to pt

## 2015-05-03 NOTE — ED Notes (Signed)
Ordered patient dinner tray

## 2015-05-03 NOTE — ED Notes (Addendum)
PA-C at bedside, completing assessment before RN assessment. See PA-C note.

## 2015-05-03 NOTE — ED Notes (Signed)
Pt from home for dialysis, no complaints. States was told would be ready at 1800. Pt states last dialysis on Tuesday for 2 hrs, had to leave because she was cramping.

## 2015-05-04 DIAGNOSIS — I120 Hypertensive chronic kidney disease with stage 5 chronic kidney disease or end stage renal disease: Secondary | ICD-10-CM | POA: Diagnosis not present

## 2015-05-04 NOTE — Progress Notes (Signed)
Pt had aranesp  On 05/01/2015 per pharmacy.

## 2015-05-05 ENCOUNTER — Emergency Department (HOSPITAL_COMMUNITY)
Admission: EM | Admit: 2015-05-05 | Discharge: 2015-05-05 | Disposition: A | Discharge status: Home / Self Care | Payer: Medicare Other | Attending: Emergency Medicine | Admitting: Emergency Medicine

## 2015-05-05 ENCOUNTER — Encounter (HOSPITAL_COMMUNITY): Payer: Self-pay | Admitting: *Deleted

## 2015-05-05 DIAGNOSIS — R140 Abdominal distension (gaseous): Secondary | ICD-10-CM | POA: Diagnosis not present

## 2015-05-05 DIAGNOSIS — Z7952 Long term (current) use of systemic steroids: Secondary | ICD-10-CM | POA: Insufficient documentation

## 2015-05-05 DIAGNOSIS — Z8659 Personal history of other mental and behavioral disorders: Secondary | ICD-10-CM | POA: Diagnosis not present

## 2015-05-05 DIAGNOSIS — Z992 Dependence on renal dialysis: Secondary | ICD-10-CM | POA: Diagnosis not present

## 2015-05-05 DIAGNOSIS — Z4932 Encounter for adequacy testing for peritoneal dialysis: Secondary | ICD-10-CM | POA: Diagnosis present

## 2015-05-05 DIAGNOSIS — F17210 Nicotine dependence, cigarettes, uncomplicated: Secondary | ICD-10-CM | POA: Diagnosis not present

## 2015-05-05 DIAGNOSIS — Z8739 Personal history of other diseases of the musculoskeletal system and connective tissue: Secondary | ICD-10-CM | POA: Insufficient documentation

## 2015-05-05 DIAGNOSIS — Z79899 Other long term (current) drug therapy: Secondary | ICD-10-CM | POA: Diagnosis not present

## 2015-05-05 DIAGNOSIS — N186 End stage renal disease: Secondary | ICD-10-CM

## 2015-05-05 DIAGNOSIS — Z862 Personal history of diseases of the blood and blood-forming organs and certain disorders involving the immune mechanism: Secondary | ICD-10-CM | POA: Insufficient documentation

## 2015-05-05 DIAGNOSIS — I509 Heart failure, unspecified: Secondary | ICD-10-CM | POA: Insufficient documentation

## 2015-05-05 DIAGNOSIS — I120 Hypertensive chronic kidney disease with stage 5 chronic kidney disease or end stage renal disease: Secondary | ICD-10-CM | POA: Diagnosis not present

## 2015-05-05 LAB — HEPATITIS B SURFACE ANTIGEN: HEP B S AG: NEGATIVE

## 2015-05-05 LAB — BASIC METABOLIC PANEL
ANION GAP: 13 (ref 5–15)
BUN: 42 mg/dL — ABNORMAL HIGH (ref 6–20)
CALCIUM: 9.3 mg/dL (ref 8.9–10.3)
CO2: 25 mmol/L (ref 22–32)
CREATININE: 9.39 mg/dL — AB (ref 0.44–1.00)
Chloride: 99 mmol/L — ABNORMAL LOW (ref 101–111)
GFR, EST AFRICAN AMERICAN: 6 mL/min — AB (ref >60)
GFR, EST NON AFRICAN AMERICAN: 5 mL/min — AB (ref >60)
Glucose, Bld: 124 mg/dL — ABNORMAL HIGH (ref 65–99)
Potassium: 4.5 mmol/L (ref 3.5–5.1)
SODIUM: 137 mmol/L (ref 135–145)

## 2015-05-05 LAB — CBC
HCT: 27.3 % — ABNORMAL LOW (ref 36.0–46.0)
HEMOGLOBIN: 8.5 g/dL — AB (ref 12.0–15.0)
MCH: 27 pg (ref 26.0–34.0)
MCHC: 31.1 g/dL (ref 30.0–36.0)
MCV: 86.7 fL (ref 78.0–100.0)
PLATELETS: 308 10*3/uL (ref 150–400)
RBC: 3.15 MIL/uL — AB (ref 3.87–5.11)
RDW: 21 % — ABNORMAL HIGH (ref 11.5–15.5)
WBC: 5.4 10*3/uL (ref 4.0–10.5)
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
## 2015-05-05 NOTE — ED Notes (Signed)
Lunch ordered 

## 2015-05-05 NOTE — ED Notes (Signed)
Pt here for dialysis. No other complaints 

## 2015-05-05 NOTE — ED Notes (Signed)
Lunch tray ordered 

## 2015-05-05 NOTE — Progress Notes (Signed)
Patient requested to sign off treatment against medical advise one hour early.  Patient signed consent.  Patient alert and oriented, VSS and denies pain.  Patient discharged from dialysis without issue.

## 2015-05-08 ENCOUNTER — Encounter (HOSPITAL_COMMUNITY): Payer: Self-pay | Admitting: Cardiology

## 2015-05-08 ENCOUNTER — Non-Acute Institutional Stay (HOSPITAL_COMMUNITY)
Admission: EM | Admit: 2015-05-08 | Discharge: 2015-05-08 | Disposition: A | Discharge status: Home / Self Care | Payer: Medicare Other | Attending: Emergency Medicine | Admitting: Emergency Medicine

## 2015-05-08 DIAGNOSIS — N186 End stage renal disease: Secondary | ICD-10-CM | POA: Diagnosis not present

## 2015-05-08 DIAGNOSIS — R000 Tachycardia, unspecified: Secondary | ICD-10-CM | POA: Diagnosis not present

## 2015-05-08 DIAGNOSIS — M329 Systemic lupus erythematosus, unspecified: Secondary | ICD-10-CM | POA: Insufficient documentation

## 2015-05-08 DIAGNOSIS — N19 Unspecified kidney failure: Secondary | ICD-10-CM | POA: Diagnosis present

## 2015-05-08 DIAGNOSIS — F17210 Nicotine dependence, cigarettes, uncomplicated: Secondary | ICD-10-CM | POA: Insufficient documentation

## 2015-05-08 DIAGNOSIS — Z992 Dependence on renal dialysis: Secondary | ICD-10-CM | POA: Insufficient documentation

## 2015-05-08 DIAGNOSIS — I5022 Chronic systolic (congestive) heart failure: Secondary | ICD-10-CM | POA: Diagnosis not present

## 2015-05-08 DIAGNOSIS — I120 Hypertensive chronic kidney disease with stage 5 chronic kidney disease or end stage renal disease: Secondary | ICD-10-CM | POA: Insufficient documentation

## 2015-05-08 LAB — RENAL FUNCTION PANEL
Albumin: 2.6 g/dL — ABNORMAL LOW (ref 3.5–5.0)
Anion gap: 15 (ref 5–15)
BUN: 52 mg/dL — ABNORMAL HIGH (ref 6–20)
CO2: 23 mmol/L (ref 22–32)
Calcium: 9 mg/dL (ref 8.9–10.3)
Chloride: 100 mmol/L — ABNORMAL LOW (ref 101–111)
Creatinine, Ser: 10.7 mg/dL — ABNORMAL HIGH (ref 0.44–1.00)
GFR calc Af Amer: 5 mL/min — ABNORMAL LOW (ref >60)
GFR calc non Af Amer: 4 mL/min — ABNORMAL LOW (ref >60)
Glucose, Bld: 158 mg/dL — ABNORMAL HIGH (ref 65–99)
Phosphorus: 10.2 mg/dL — ABNORMAL HIGH (ref 2.5–4.6)
Potassium: 4.8 mmol/L (ref 3.5–5.1)
Sodium: 138 mmol/L (ref 135–145)

## 2015-05-08 LAB — CBC
HCT: 25.1 % — ABNORMAL LOW (ref 36.0–46.0)
Hemoglobin: 8 g/dL — ABNORMAL LOW (ref 12.0–15.0)
MCH: 27.1 pg (ref 26.0–34.0)
MCHC: 31.9 g/dL (ref 30.0–36.0)
MCV: 85.1 fL (ref 78.0–100.0)
Platelets: 262 K/uL (ref 150–400)
RBC: 2.95 MIL/uL — ABNORMAL LOW (ref 3.87–5.11)
RDW: 20.6 % — ABNORMAL HIGH (ref 11.5–15.5)
WBC: 5.3 10*3/uL (ref 4.0–10.5)

## 2015-05-08 MED ORDER — SODIUM CHLORIDE 0.9 % IV SOLN: 100.0000 mL | INTRAVENOUS | Status: DC | PRN

## 2015-05-08 MED ORDER — SODIUM CHLORIDE 0.9 % IV SOLN
125.0000 mg | Freq: Once | INTRAVENOUS | Status: AC
  Administered 2015-05-08: 125 mg via INTRAVENOUS
  Filled 2015-05-08: qty 10

## 2015-05-08 MED ORDER — PENTAFLUOROPROP-TETRAFLUOROETH EX AERO: 1.0000 "application " | INHALATION_SPRAY | CUTANEOUS | Status: DC | PRN

## 2015-05-08 MED ORDER — DARBEPOETIN ALFA 200 MCG/0.4ML IJ SOSY
200.0000 ug | PREFILLED_SYRINGE | INTRAMUSCULAR | Status: DC
  Administered 2015-05-08: 200 ug via INTRAVENOUS

## 2015-05-08 MED ORDER — LIDOCAINE-PRILOCAINE 2.5-2.5 % EX CREA: 1.0000 "application " | TOPICAL_CREAM | CUTANEOUS | Status: DC | PRN

## 2015-05-08 MED ORDER — DARBEPOETIN ALFA 200 MCG/0.4ML IJ SOSY
PREFILLED_SYRINGE | INTRAMUSCULAR | Status: AC
  Filled 2015-05-08: qty 0.4

## 2015-05-08 MED ORDER — LIDOCAINE HCL (PF) 1 % IJ SOLN: 5.0000 mL | INTRAMUSCULAR | Status: DC | PRN

## 2015-05-08 NOTE — ED Notes (Signed)
Nephrology paged to [facility]

## 2015-05-08 NOTE — ED Notes (Signed)
Pt being transported to dialysis via w/c per Caryl Asp, Dialysis RN.
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
## 2015-05-08 NOTE — ED Notes (Signed)
Pt ambulatory to C28 w/RN - bedside table w/dinner tray taken into room w/pt. Pt advised she has already contacted dialysis and they are ready for her when she finishes eating.

## 2015-05-08 NOTE — ED Notes (Signed)
HD called & will place pt on dialysis after she eats, Network engineer called nutritional services & will have the pts tray picked up at 16:00, which nutritional services reports is the earliest that a tray can be picked up

## 2015-05-08 NOTE — ED Notes (Signed)
Pt from home with no complaints, states "I just need dialysis and they are ready for me." nad ntoed.

## 2015-05-08 NOTE — ED Notes (Signed)
States she is here for routine dialysis

## 2015-05-08 NOTE — Progress Notes (Signed)
Access flow greater than 2000.

## 2015-05-08 NOTE — ED Notes (Signed)
Pt ordered dinner tray.

## 2015-05-10 ENCOUNTER — Encounter (HOSPITAL_COMMUNITY): Payer: Self-pay

## 2015-05-10 ENCOUNTER — Emergency Department (HOSPITAL_COMMUNITY)
Admission: EM | Admit: 2015-05-10 | Discharge: 2015-05-10 | Discharge status: Against Medical Advice | Payer: Medicare Other | Attending: Emergency Medicine | Admitting: Emergency Medicine

## 2015-05-10 DIAGNOSIS — R21 Rash and other nonspecific skin eruption: Secondary | ICD-10-CM | POA: Insufficient documentation

## 2015-05-10 DIAGNOSIS — I509 Heart failure, unspecified: Secondary | ICD-10-CM | POA: Insufficient documentation

## 2015-05-10 DIAGNOSIS — N186 End stage renal disease: Secondary | ICD-10-CM | POA: Diagnosis not present

## 2015-05-10 DIAGNOSIS — Z862 Personal history of diseases of the blood and blood-forming organs and certain disorders involving the immune mechanism: Secondary | ICD-10-CM | POA: Insufficient documentation

## 2015-05-10 DIAGNOSIS — F17210 Nicotine dependence, cigarettes, uncomplicated: Secondary | ICD-10-CM | POA: Insufficient documentation

## 2015-05-10 DIAGNOSIS — I120 Hypertensive chronic kidney disease with stage 5 chronic kidney disease or end stage renal disease: Secondary | ICD-10-CM | POA: Insufficient documentation

## 2015-05-10 DIAGNOSIS — Z8659 Personal history of other mental and behavioral disorders: Secondary | ICD-10-CM | POA: Diagnosis not present

## 2015-05-10 DIAGNOSIS — Z992 Dependence on renal dialysis: Secondary | ICD-10-CM | POA: Insufficient documentation

## 2015-05-10 DIAGNOSIS — Z8739 Personal history of other diseases of the musculoskeletal system and connective tissue: Secondary | ICD-10-CM | POA: Diagnosis not present

## 2015-05-10 LAB — BASIC METABOLIC PANEL
ANION GAP: 19 — AB (ref 5–15)
BUN: 53 mg/dL — ABNORMAL HIGH (ref 6–20)
CHLORIDE: 96 mmol/L — AB (ref 101–111)
CO2: 24 mmol/L (ref 22–32)
CREATININE: 9.75 mg/dL — AB (ref 0.44–1.00)
Calcium: 9.4 mg/dL (ref 8.9–10.3)
GFR calc non Af Amer: 5 mL/min — ABNORMAL LOW (ref >60)
GFR, EST AFRICAN AMERICAN: 5 mL/min — AB (ref >60)
Glucose, Bld: 92 mg/dL (ref 65–99)
Potassium: 4.8 mmol/L (ref 3.5–5.1)
SODIUM: 139 mmol/L (ref 135–145)
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
## 2015-05-10 NOTE — ED Notes (Signed)
Patient here for her regular dialysis, last treatment on tuesday

## 2015-05-10 NOTE — ED Notes (Signed)
Pt. Has no complaints at this time. Pt. Here for dialysis.

## 2015-05-10 NOTE — ED Notes (Signed)
Dinner tray ordered at this time.
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
## 2015-05-11 ENCOUNTER — Non-Acute Institutional Stay (HOSPITAL_COMMUNITY)
Admission: EM | Admit: 2015-05-11 | Discharge: 2015-05-11 | Disposition: A | Discharge status: Home / Self Care | Payer: Medicare Other | Attending: Emergency Medicine | Admitting: Emergency Medicine

## 2015-05-11 ENCOUNTER — Encounter (HOSPITAL_COMMUNITY): Payer: Self-pay

## 2015-05-11 DIAGNOSIS — I509 Heart failure, unspecified: Secondary | ICD-10-CM | POA: Insufficient documentation

## 2015-05-11 DIAGNOSIS — D649 Anemia, unspecified: Secondary | ICD-10-CM | POA: Diagnosis not present

## 2015-05-11 DIAGNOSIS — I132 Hypertensive heart and chronic kidney disease with heart failure and with stage 5 chronic kidney disease, or end stage renal disease: Secondary | ICD-10-CM | POA: Diagnosis not present

## 2015-05-11 DIAGNOSIS — I120 Hypertensive chronic kidney disease with stage 5 chronic kidney disease or end stage renal disease: Secondary | ICD-10-CM | POA: Diagnosis not present

## 2015-05-11 DIAGNOSIS — F209 Schizophrenia, unspecified: Secondary | ICD-10-CM | POA: Insufficient documentation

## 2015-05-11 DIAGNOSIS — N186 End stage renal disease: Secondary | ICD-10-CM | POA: Diagnosis not present

## 2015-05-11 DIAGNOSIS — Z992 Dependence on renal dialysis: Secondary | ICD-10-CM | POA: Diagnosis not present

## 2015-05-11 DIAGNOSIS — F17210 Nicotine dependence, cigarettes, uncomplicated: Secondary | ICD-10-CM | POA: Diagnosis not present

## 2015-05-11 DIAGNOSIS — F319 Bipolar disorder, unspecified: Secondary | ICD-10-CM | POA: Diagnosis not present

## 2015-05-11 DIAGNOSIS — M329 Systemic lupus erythematosus, unspecified: Secondary | ICD-10-CM | POA: Diagnosis not present

## 2015-05-11 LAB — CBC
HCT: 27.8 % — ABNORMAL LOW (ref 36.0–46.0)
HEMOGLOBIN: 8.8 g/dL — AB (ref 12.0–15.0)
MCH: 27.2 pg (ref 26.0–34.0)
MCHC: 31.7 g/dL (ref 30.0–36.0)
MCV: 86.1 fL (ref 78.0–100.0)
PLATELETS: 207 10*3/uL (ref 150–400)
RBC: 3.23 MIL/uL — ABNORMAL LOW (ref 3.87–5.11)
RDW: 20.3 % — AB (ref 11.5–15.5)
WBC: 6 10*3/uL (ref 4.0–10.5)

## 2015-05-11 LAB — RENAL FUNCTION PANEL
ALBUMIN: 2.9 g/dL — AB (ref 3.5–5.0)
Anion gap: 17 — ABNORMAL HIGH (ref 5–15)
BUN: 61 mg/dL — AB (ref 6–20)
CALCIUM: 9.8 mg/dL (ref 8.9–10.3)
CO2: 22 mmol/L (ref 22–32)
Chloride: 99 mmol/L — ABNORMAL LOW (ref 101–111)
Creatinine, Ser: 10.66 mg/dL — ABNORMAL HIGH (ref 0.44–1.00)
GFR calc Af Amer: 5 mL/min — ABNORMAL LOW (ref >60)
GFR calc non Af Amer: 4 mL/min — ABNORMAL LOW (ref >60)
GLUCOSE: 125 mg/dL — AB (ref 65–99)
PHOSPHORUS: 10.6 mg/dL — AB (ref 2.5–4.6)
POTASSIUM: 5 mmol/L (ref 3.5–5.1)
SODIUM: 138 mmol/L (ref 135–145)

## 2015-05-11 MED ORDER — PENTAFLUOROPROP-TETRAFLUOROETH EX AERO: 1.0000 "application " | INHALATION_SPRAY | CUTANEOUS | Status: DC | PRN

## 2015-05-11 MED ORDER — ALTEPLASE 2 MG IJ SOLR
2.0000 mg | Freq: Once | INTRAMUSCULAR | Status: DC | PRN
Start: 2015-05-11 — End: 2015-05-11

## 2015-05-11 MED ORDER — HEPARIN SODIUM (PORCINE) 1000 UNIT/ML DIALYSIS: 1000.0000 [IU] | INTRAMUSCULAR | Status: DC | PRN

## 2015-05-11 MED ORDER — LIDOCAINE-PRILOCAINE 2.5-2.5 % EX CREA: 1.0000 "application " | TOPICAL_CREAM | CUTANEOUS | Status: DC | PRN

## 2015-05-11 MED ORDER — LIDOCAINE HCL (PF) 1 % IJ SOLN: 5.0000 mL | INTRAMUSCULAR | Status: DC | PRN

## 2015-05-11 MED ORDER — SODIUM CHLORIDE 0.9 % IV SOLN: 100.0000 mL | INTRAVENOUS | Status: DC | PRN

## 2015-05-11 NOTE — ED Notes (Signed)
Pt presents for hemodialysis.  Last HD was on Tuesday.

## 2015-05-11 NOTE — ED Notes (Signed)
Waiting for dialysis-- dialysis would like pt to eat before.

## 2015-05-11 NOTE — ED Notes (Signed)
Patient given food. Eating at this time
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
## 2015-05-11 NOTE — ED Notes (Signed)
Spoke with Tommi Rumps, Dialysis RN. Pt has diet tray ordered and stated that he would paged Nephrology and let them know she is here.

## 2015-05-11 NOTE — ED Notes (Signed)
Pt being transported upstairs by [place], NT.

## 2015-05-11 NOTE — ED Notes (Signed)
Diet ordered 

## 2015-05-11 NOTE — Progress Notes (Signed)
Discharge patient post hemodialysis, patient did not complete treatment time, papersigned. Patient very verbally abusive to staff, cursing . Security made aware.

## 2015-05-11 NOTE — Procedures (Signed)
Patient was seen on dialysis and the procedure was supervised.  BFR 400  Via AVF BP is  161/114.   Patient appears to be tolerating treatment well- slightly agitated- wants to see a dermatologist- says has clots coming out from her skin  ":why havent they found me a center yet ?"   Nidia Grogan A 05/11/2015

## 2015-05-14 ENCOUNTER — Encounter: Payer: Self-pay | Admitting: Nephrology

## 2015-05-15 ENCOUNTER — Emergency Department (HOSPITAL_COMMUNITY): Payer: Medicare Other

## 2015-05-15 ENCOUNTER — Emergency Department (HOSPITAL_COMMUNITY)
Admission: EM | Admit: 2015-05-15 | Discharge: 2015-05-15 | Disposition: A | Discharge status: Home / Self Care | Payer: Medicare Other | Attending: Emergency Medicine | Admitting: Emergency Medicine

## 2015-05-15 ENCOUNTER — Observation Stay (HOSPITAL_BASED_OUTPATIENT_CLINIC_OR_DEPARTMENT_OTHER)
Admission: EM | Admit: 2015-05-15 | Discharge: 2015-05-16 | Discharge status: Against Medical Advice | Payer: Medicare Other | Source: Home / Self Care | Attending: Emergency Medicine | Admitting: Emergency Medicine

## 2015-05-15 ENCOUNTER — Emergency Department (HOSPITAL_COMMUNITY)
Admission: EM | Admit: 2015-05-15 | Discharge: 2015-05-15 | Discharge status: Against Medical Advice | Payer: Medicare Other | Source: Home / Self Care | Attending: Emergency Medicine | Admitting: Emergency Medicine

## 2015-05-15 ENCOUNTER — Encounter (HOSPITAL_COMMUNITY): Payer: Self-pay | Admitting: Cardiology

## 2015-05-15 ENCOUNTER — Encounter (HOSPITAL_COMMUNITY): Payer: Self-pay | Admitting: Emergency Medicine

## 2015-05-15 DIAGNOSIS — F319 Bipolar disorder, unspecified: Secondary | ICD-10-CM | POA: Diagnosis present

## 2015-05-15 DIAGNOSIS — I120 Hypertensive chronic kidney disease with stage 5 chronic kidney disease or end stage renal disease: Secondary | ICD-10-CM

## 2015-05-15 DIAGNOSIS — R0600 Dyspnea, unspecified: Secondary | ICD-10-CM | POA: Diagnosis not present

## 2015-05-15 DIAGNOSIS — F250 Schizoaffective disorder, bipolar type: Secondary | ICD-10-CM | POA: Diagnosis present

## 2015-05-15 DIAGNOSIS — I509 Heart failure, unspecified: Secondary | ICD-10-CM

## 2015-05-15 DIAGNOSIS — Z862 Personal history of diseases of the blood and blood-forming organs and certain disorders involving the immune mechanism: Secondary | ICD-10-CM

## 2015-05-15 DIAGNOSIS — E875 Hyperkalemia: Secondary | ICD-10-CM

## 2015-05-15 DIAGNOSIS — R05 Cough: Secondary | ICD-10-CM | POA: Insufficient documentation

## 2015-05-15 DIAGNOSIS — Z8739 Personal history of other diseases of the musculoskeletal system and connective tissue: Secondary | ICD-10-CM | POA: Insufficient documentation

## 2015-05-15 DIAGNOSIS — F17210 Nicotine dependence, cigarettes, uncomplicated: Secondary | ICD-10-CM | POA: Insufficient documentation

## 2015-05-15 DIAGNOSIS — N185 Chronic kidney disease, stage 5: Secondary | ICD-10-CM | POA: Diagnosis present

## 2015-05-15 DIAGNOSIS — Z4931 Encounter for adequacy testing for hemodialysis: Secondary | ICD-10-CM

## 2015-05-15 DIAGNOSIS — F209 Schizophrenia, unspecified: Secondary | ICD-10-CM | POA: Insufficient documentation

## 2015-05-15 DIAGNOSIS — R062 Wheezing: Secondary | ICD-10-CM | POA: Insufficient documentation

## 2015-05-15 DIAGNOSIS — N186 End stage renal disease: Secondary | ICD-10-CM | POA: Insufficient documentation

## 2015-05-15 DIAGNOSIS — Z992 Dependence on renal dialysis: Secondary | ICD-10-CM | POA: Insufficient documentation

## 2015-05-15 DIAGNOSIS — Z8659 Personal history of other mental and behavioral disorders: Secondary | ICD-10-CM | POA: Insufficient documentation

## 2015-05-15 DIAGNOSIS — R0989 Other specified symptoms and signs involving the circulatory and respiratory systems: Secondary | ICD-10-CM | POA: Diagnosis not present

## 2015-05-15 DIAGNOSIS — F29 Unspecified psychosis not due to a substance or known physiological condition: Secondary | ICD-10-CM | POA: Diagnosis not present

## 2015-05-15 DIAGNOSIS — Z9114 Patient's other noncompliance with medication regimen: Secondary | ICD-10-CM

## 2015-05-15 DIAGNOSIS — I309 Acute pericarditis, unspecified: Secondary | ICD-10-CM | POA: Diagnosis not present

## 2015-05-15 DIAGNOSIS — Z79899 Other long term (current) drug therapy: Secondary | ICD-10-CM

## 2015-05-15 DIAGNOSIS — M329 Systemic lupus erythematosus, unspecified: Secondary | ICD-10-CM | POA: Insufficient documentation

## 2015-05-15 DIAGNOSIS — Z91148 Patient's other noncompliance with medication regimen for other reason: Secondary | ICD-10-CM

## 2015-05-15 DIAGNOSIS — I5030 Unspecified diastolic (congestive) heart failure: Secondary | ICD-10-CM | POA: Diagnosis present

## 2015-05-15 DIAGNOSIS — D649 Anemia, unspecified: Secondary | ICD-10-CM | POA: Insufficient documentation

## 2015-05-15 DIAGNOSIS — R42 Dizziness and giddiness: Secondary | ICD-10-CM | POA: Diagnosis not present

## 2015-05-15 DIAGNOSIS — R0602 Shortness of breath: Secondary | ICD-10-CM | POA: Diagnosis not present

## 2015-05-15 LAB — I-STAT CHEM 8, ED
BUN: 105 mg/dL — ABNORMAL HIGH (ref 6–20)
CALCIUM ION: 1.09 mmol/L — AB (ref 1.12–1.23)
CHLORIDE: 103 mmol/L (ref 101–111)
Creatinine, Ser: 15.5 mg/dL — ABNORMAL HIGH (ref 0.44–1.00)
GLUCOSE: 109 mg/dL — AB (ref 65–99)
HCT: 31 % — ABNORMAL LOW (ref 36.0–46.0)
HEMOGLOBIN: 10.5 g/dL — AB (ref 12.0–15.0)
POTASSIUM: 5.6 mmol/L — AB (ref 3.5–5.1)
SODIUM: 137 mmol/L (ref 135–145)
TCO2: 21 mmol/L (ref 0–100)

## 2015-05-15 MED ORDER — IPRATROPIUM-ALBUTEROL 0.5-2.5 (3) MG/3ML IN SOLN
3.0000 mL | RESPIRATORY_TRACT | Status: AC
Start: 2015-05-15 — End: 2015-05-15
  Administered 2015-05-15 (×2): 3 mL via RESPIRATORY_TRACT
  Filled 2015-05-15: qty 6

## 2015-05-15 NOTE — ED Notes (Signed)
Patient walked to waiting room to "charge my phone, it's dead.  I have to get some papers to someone, so I have to have it".    Patient states she would return asap.

## 2015-05-15 NOTE — ED Notes (Signed)
Patient called in main ED waiting area with no response; patient was seen walking outside per staff

## 2015-05-15 NOTE — ED Notes (Signed)
Patient left AMA.

## 2015-05-15 NOTE — ED Notes (Signed)
Dialysis made aware that pt checked back in.

## 2015-05-15 NOTE — ED Provider Notes (Signed)
By signing my name below, I, Helane Gunther, attest that this documentation has been prepared under the direction and in the presence of [employer], DO. Electronically Signed: Helane Gunther, ED Scribe. 05/15/2015. 11:16 PM.  TIME SEEN: 11:10 PM  CHIEF COMPLAINT: Chronic Kidney Disease  HPI: Sara [name] is a 35 y.o. female smoker at 1 ppd with a PMHx of ESRD on HD, chronic anemia, CHF, schizophrenia, and HTN who presents to the Emergency Department to be admitted for dialysis for her chronic kidney disease. She notes her last dialysis was 4 days ago. She also endorses wheezing and cough onset 2 days ago, as well as sharp chest pain that occurred yesterday and has since resolved. Pt denies fever or any current pains. Has had a dry cough over the past several days, wheezing. Patient was admitted to [facility] for dialysis and taken to a treatment room. She left before receiving dialysis because she did not like the room that she was in.  ROS: See HPI Constitutional: no fever  Eyes: no drainage  ENT: no runny nose   Cardiovascular:  chest pain (resolved) Resp: no SOB, wheezing, cough  GI: no vomiting GU: no dysuria Integumentary: no rash  Allergy: no hives  Musculoskeletal: no leg swelling  Neurological: no slurred speech ROS otherwise negative  PAST MEDICAL HISTORY/PAST SURGICAL HISTORY:  Past Medical History  Diagnosis Date  . Lupus ([place])     lupus w nephritis  . History of blood transfusion     "this is probably my 3rd" (10/09/2014)  . ESRD (end stage renal disease) on dialysis [facility])     "[name]; [facility]" (10/09/2014)  . Bipolar disorder ([place])     [name] 10/09/2014  . Schizophrenia ([place])     [name] 10/09/2014  . Chronic anemia     [name] 10/09/2014  . CHF (congestive heart failure) ([place])     systolic/notes 123XX123  . Acute myopericarditis     hx/notes 10/09/2014  . Psychosis   . Hypertension   . Pregnancy   . Low back pain     MEDICATIONS:  Prior to Admission  medications   Medication Sig Start Date End Date Taking? Authorizing Provider  acetaminophen (TYLENOL) 500 MG tablet Take 1,000 mg by mouth every 6 (six) hours as needed for mild pain.     Historical Provider, MD  metoprolol (LOPRESSOR) 50 MG tablet Take 1 tablet (50 mg total) by mouth 2 (two) times daily. Patient not taking: Reported on 05/10/2015 01/01/15   Smiley Houseman, MD  Naproxen Sodium (ALEVE PO) Take 1 tablet by mouth daily as needed. For lower back pain per patient    Historical Provider, MD  Phenylephrine-DM-GG (ROBITUSSIN CHILD COUGH/COLD CF PO) Take 20 mg by mouth at bedtime. Take every night per patient    Historical Provider, MD  traMADol (ULTRAM) 50 MG tablet Take 1 tablet (50 mg total) by mouth every 8 (eight) hours as needed for moderate pain or severe pain. Patient not taking: Reported on 05/11/2015 03/16/15   Smiley Houseman, MD  triamcinolone (KENALOG) 0.025 % ointment APPLY 1 APPLICATION TOPICALLY 2 (TWO) TIMES DAILY. Patient not taking: Reported on 05/05/2015 02/15/15   Smiley Houseman, MD    ALLERGIES:  Allergies  Allergen Reactions  . Ativan [Lorazepam] Other (See Comments)    Dysarthria(patient has difficulty speaking and slurred speech); denies swelling, itching, pain, or numbness.  [name] [Ziprasidone Hcl] Itching and Swelling    Tongue swelling  . Keflex [Cephalexin] Swelling  and Other (See Comments)    Tongue swelling. Can't talk   . Haldol [Haloperidol Lactate] Swelling    Tongue swelling  . Other Itching    wool    SOCIAL HISTORY:  Social History  Substance Use Topics  . Smoking status: Current Every Day Smoker -- 1.00 packs/day for 2 years    Types: Cigarettes  . Smokeless tobacco: Current User  . Alcohol Use: 4.2 oz/week    4 Cans of beer, 3 Shots of liquor per week     Comment: WEEKENDS- 02/21/14-denies that she has not used any etoh or drugs in over a year.    FAMILY HISTORY: Family History  Problem Relation Age of Onset  . Drug  abuse Father   . Kidney disease Father     EXAM: BP 139/100 mmHg  Pulse 100  Temp(Src) 97.6 F (36.4 C) (Oral)  Resp 18  SpO2 100% CONSTITUTIONAL: Alert and oriented and responds appropriately to questions. Chronically ill-appearing; well-nourished HEAD: Normocephalic EYES: Conjunctivae clear, PERRL ENT: normal nose; no rhinorrhea; moist mucous membranes; pharynx without lesions noted NECK: Supple, no meningismus, no LAD  CARD: RRR; S1 and S2 appreciated; no murmurs, no clicks, no rubs, no gallops RESP: Normal chest excursion without splinting or tachypnea; breath sounds equal bilaterally; diffuse expiratory wheezes, no rhonchi, no rales, no hypoxia or respiratory distress, speaking full sentences ABD/GI: Normal bowel sounds; non-distended; soft, non-tender, no rebound, no guarding, no peritoneal signs BACK:  The back appears normal and is non-tender to palpation, there is no CVA tenderness EXT: Normal ROM in all joints; non-tender to palpation; no edema; normal capillary refill; no cyanosis, no calf tenderness or swelling , radial fistula with thrill and 2+ radial pulse, no erythema, warmth, orTTP  SKIN: Normal color for age and race; warm NEURO: Moves all extremities equally, sensation to light touch intact diffusely, cranial nerves II through XII intact PSYCH: The patient's mood and manner are appropriate. Grooming and personal hygiene are appropriate.  MEDICAL DECISION MAKING: Patient here with poorly controlled schizophrenia who frequently misses dialysis due to the same. She is wheezing and coughing currently but this may be secondary to viral illness. She is afebrile and does not appear significantly volume overloaded. She is minimally hypertensive which is her baseline. She had chest pain yesterday but none currently. EKG shows no ischemic changes. Plan is to give duo nebs, obtain labs, chest x-ray. Anticipate non-emergent dialysis in the morning. Patient agrees to stay.  ED  PROGRESS: 12:00 AM  Spoke with Dr. Lowanda Foster with nephrology.  He will schedule nonemergent dialysis in the morning. He recommends admission to hospitalist service. Patient agrees to stay for this.  12:05 AM  D/w Dr. Marin Comment with hospitalist service for admission to telemetry. He will see patient and place orders.  Care transferred to hospitalist service. Chest x-ray shows vascular congestion, no infiltrate.    EKG Interpretation  Date/Time:  Tuesday May 15 2015 23:27:49 EST Ventricular Rate:  107 PR Interval:  144 QRS Duration: 98 QT Interval:  352 QTC Calculation: 470 R Axis:   66 Text Interpretation:  Sinus tachycardia Left atrial enlargement Left ventricular hypertrophy Nonspecific T abnormalities, lateral leads No significant change since last tracing Confirmed by Campbell Agramonte,  DO, Shonta Phillis [phone]) on 05/15/2015 11:52:33 PM       I personally performed the services described in this documentation, which was scribed in my presence. The recorded information has been reviewed and is accurate.   [place], DO 05/16/15 0008

## 2015-05-15 NOTE — ED Notes (Signed)
Patient returned from waiting room.
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
## 2015-05-15 NOTE — ED Notes (Signed)
Patient states "dialysis would be a couple of hours and she had things to do".  Patient left.
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
## 2015-05-15 NOTE — ED Notes (Addendum)
Pt states she has been using here and [facility] for dialysis for over last year and states "I just need dialysis".  Had dialysis Friday, was at [facility] this morning for dialysis and left without treatment

## 2015-05-15 NOTE — ED Notes (Signed)
States she was here earlier for dialysis but left because she was going to try and make it to an appt. Decided to stay and have dialysis today.

## 2015-05-15 NOTE — ED Notes (Addendum)
Patient here for dialysis.   Patient is T, Th, Sat dialysis and was last seen Friday for same.  Patient complaining of laryngitis, and some overall body aches.  Denies other symptoms.   Patient states she is homeless as of yesterday.

## 2015-05-15 NOTE — ED Notes (Addendum)
Pt here tonight to be admitted for dialysis. Pt was waiting in [place] & left. Pt says she is being site up in [place] next week. Pt shows no signs of swelling, distress or shortness of breath.

## 2015-05-15 NOTE — ED Notes (Signed)
Patient left to go to waiting room again to charge phone.

## 2015-05-16 ENCOUNTER — Telehealth: Payer: Self-pay | Admitting: Internal Medicine

## 2015-05-16 ENCOUNTER — Encounter (HOSPITAL_COMMUNITY): Payer: Self-pay | Admitting: Internal Medicine

## 2015-05-16 DIAGNOSIS — I5030 Unspecified diastolic (congestive) heart failure: Secondary | ICD-10-CM

## 2015-05-16 DIAGNOSIS — F319 Bipolar disorder, unspecified: Secondary | ICD-10-CM | POA: Diagnosis not present

## 2015-05-16 DIAGNOSIS — N186 End stage renal disease: Secondary | ICD-10-CM

## 2015-05-16 DIAGNOSIS — Z992 Dependence on renal dialysis: Secondary | ICD-10-CM

## 2015-05-16 DIAGNOSIS — N185 Chronic kidney disease, stage 5: Secondary | ICD-10-CM

## 2015-05-16 DIAGNOSIS — I120 Hypertensive chronic kidney disease with stage 5 chronic kidney disease or end stage renal disease: Secondary | ICD-10-CM | POA: Diagnosis not present

## 2015-05-16 MED ORDER — ACETAMINOPHEN 500 MG PO TABS: 1000.0000 mg | ORAL_TABLET | Freq: Four times a day (QID) | ORAL | Status: DC | PRN

## 2015-05-16 MED ORDER — SODIUM CHLORIDE 0.9% FLUSH
3.0000 mL | Freq: Two times a day (BID) | INTRAVENOUS | Status: DC
  Administered 2015-05-16: 3 mL via INTRAVENOUS

## 2015-05-16 NOTE — Progress Notes (Signed)
Pt stated she needed to go off the floor to get clothes from a friend. Pt currently wearing telemetry monitoring. RN stated to patient that they are not to leave the floor. RN and tech offered to go get clothes/items for patient. Pt stated that she needed to go get them by herself. RN told pt that without being accompanied or having a staff member go get the clothes/items for her,  She could not leave the floor. Pt insisted on leaving AMA. RN notified charge nurse, MD and had patient sign AMA form. IV access D/C, Telemetry box off and form in pt's chart. Oswald Hillock, RN
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
## 2015-05-16 NOTE — Discharge Summary (Signed)
Physician Discharge Summary  Sara [name] [id number] DOB: 02-21-1981 DOA: 05/15/2015  PCP: Smiley Houseman, MD  Admit date: 05/15/2015 Discharge date: 05/16/2015  Time spent: 15 minutes  Recommendations for Outpatient Follow-up:  Left AMA  Discharge Diagnoses:  Active Problems:   CKD (chronic kidney disease) stage 5, GFR less than 15 ml/min (HCC)   Bipolar 1 disorder (HCC)   Hyperkalemia   ESRD (end stage renal disease) on dialysis (HCC)   Schizoaffective disorder, bipolar type (HCC)   Non compliance w medication regimen   (HFpEF) heart failure with preserved ejection fraction [facility])   Discharge Condition: Left AMA  Diet recommendation: left AMA  Filed Weights   05/16/15 0454  Weight: 64.003 kg (141 lb 1.6 oz)    History of present illness:  35 y.o. female With PMH of ESRD on HD, chronic anemia, CHF, schizophrenia and HTN presents to be admitted for dialysis. She also complains of wheezing and dry cough. She reports her last HD was four days ago. Workup in ED revealed potassium 5.6; BUN 105 & Creatinine 15.50. She reports HD on Tuesday, Thursday and Saturday. She missed Tuesday and Wednesday. Hospitalist asked to admit for dialysis and nebs.   Hospital Course:  1-wheezing and dry cough: most likely associated to mild pulmonary edema due to missing HD. -patient felt somewhat better after nebulizer treatment given in ED -despite encourage by nursing staff, admitting physician and ED provider, patient left AMA -no HD performed   2-ESRD: with no compliance with HD -mild hyperkalemia and Cr of 15.50 at discharge -decline HD treatment and left AMA  3-HTN: continue lopressor  4-chronic systolic heart failure: -No fluid overload appreciated on exam at admission  -patient was encouraged multiple times for needs of compliance with HD and low sodium diet  5-mild hyperkalemia: -due to missing HD -EKG stable   Procedures:  See below for x-ray reports    Consultations:  Nephrology   Discharge Exam: Filed Vitals:   05/16/15 0254 05/16/15 0454  BP: 146/110 155/103  Pulse: 112 105  Temp:  98.7 F (37.1 C)  Resp: 16 20    General: patient decide to leave AMA. HD treatment not performed. Hemodynamically stable and in no distress. Patient left before I have the chance to assess her (despite been right in front of her room).   Discharge Instructions    Discharge Medication List as of 05/16/2015  9:38 AM    CONTINUE these medications which have NOT CHANGED   Details  acetaminophen (TYLENOL) 500 MG tablet Take 1,000 mg by mouth every 6 (six) hours as needed for mild pain. , Until Discontinued, Historical Med    metoprolol (LOPRESSOR) 50 MG tablet Take 1 tablet (50 mg total) by mouth 2 (two) times daily., Starting 01/01/2015, Until Discontinued, Print    Naproxen Sodium (ALEVE PO) Take 1 tablet by mouth daily as needed. For lower back pain per patient, Until Discontinued, Historical Med    Phenylephrine-DM-GG (ROBITUSSIN CHILD COUGH/COLD CF PO) Take 20 mg by mouth at bedtime. Take every night per patient, Until Discontinued, Historical Med    traMADol (ULTRAM) 50 MG tablet Take 1 tablet (50 mg total) by mouth every 8 (eight) hours as needed for moderate pain or severe pain., Starting 03/16/2015, Until Discontinued, Phone In    triamcinolone (KENALOG) 0.025 % ointment APPLY 1 APPLICATION TOPICALLY 2 (TWO) TIMES DAILY., Normal       Allergies  Allergen Reactions  . Ativan [Lorazepam] Other (See Comments)    Dysarthria(patient has  difficulty speaking and slurred speech); denies swelling, itching, pain, or numbness.  [name] [Ziprasidone Hcl] Itching and Swelling    Tongue swelling  . Keflex [Cephalexin] Swelling and Other (See Comments)    Tongue swelling. Can't talk   . Haldol [Haloperidol Lactate] Swelling    Tongue swelling  . Other Itching    wool    The results of significant diagnostics from this hospitalization  (including imaging, microbiology, ancillary and laboratory) are listed below for reference.    Significant Diagnostic Studies: Dg Chest 2 View  04/24/2015  CLINICAL DATA:  Productive cough for 1 week, shortness of breath EXAM: CHEST  2 VIEW COMPARISON:  04/11/2015 FINDINGS: There are bilateral small pleural effusions. There is bilateral interstitial thickening. There is no pneumothorax. There is stable cardiomegaly. The osseous structures are unremarkable. IMPRESSION: Findings most consistent with CHF. Electronically Signed   By: Kathreen Devoid   On: 04/24/2015 16:12   Dg Chest Port 1 View  05/16/2015  CLINICAL DATA:  35 year old female with shortness of breath and cough EXAM: PORTABLE CHEST 1 VIEW COMPARISON:  Radiograph dated 04/24/2015 FINDINGS: There is stable cardiomegaly with increased vascular prominence compatible with congestive changes. There has been interval increase in vascular markings compared to prior study. No focal consolidation, pleural effusion, or pneumothorax. The osseous structures appear grossly unremarkable. Bilateral AC joints degenerative changes. IMPRESSION: Cardiomegaly with worsening of the congestive changes. Electronically Signed   By: Anner Crete M.D.   On: 05/16/2015 00:03   Dg Knee Complete 4 Views Right  04/24/2015  CLINICAL DATA:  Nontraumatic knee pain. EXAM: RIGHT KNEE - COMPLETE 4+ VIEW COMPARISON:  None. FINDINGS: No fracture, dislocation, or joint effusion. Mild degenerative changes seen in the medial compartment. IMPRESSION: No acute abnormality.  Medial compartment degenerative changes. Electronically Signed   By: Dorise Bullion III M.D   On: 04/24/2015 16:14   Dg Hip Unilat With Pelvis 2-3 Views Right  04/24/2015  CLINICAL DATA:  Worsening right hip pain over the past 2-3 days. Initial encounter. EXAM: DG HIP (WITH OR WITHOUT PELVIS) 2-3V RIGHT COMPARISON:  Plain films right hip 01/25/2015 and 08/11/2014. FINDINGS: There is no evidence of hip fracture or  dislocation. There is no evidence of arthropathy or other focal bone abnormality. IMPRESSION: Negative exam. Electronically Signed   By: Inge Rise M.D.   On: 04/24/2015 16:12   Labs: Basic Metabolic Panel:  Recent Labs Lab 05/10/15 1707 05/11/15 0909 05/15/15 2331  NA 139 138 137  K 4.8 5.0 5.6*  CL 96* 99* 103  CO2 24 22  --   GLUCOSE 92 125* 109*  BUN 53* 61* 105*  CREATININE 9.75* 10.66* 15.50*  CALCIUM 9.4 9.8  --   PHOS  --  10.6*  --    Liver Function Tests:  Recent Labs Lab 05/11/15 0909  ALBUMIN 2.9*   CBC:  Recent Labs Lab 05/11/15 0909 05/15/15 2331  WBC 6.0  --   HGB 8.8* 10.5*  HCT 27.8* 31.0*  MCV 86.1  --   PLT 207  --    BNP (last 3 results)  Recent Labs  06/29/14 1439 06/30/14 0116 09/11/14 0815  BNP >4500.0* >4500.0* 1694.9*    Signed:  Barton Dubois MD.  [facility] 05/16/2015, 9:46 AM

## 2015-05-16 NOTE — Care Management Obs Status (Signed)
[place] NOTIFICATION   Patient Details  Name: Sara [name] MRN: [id number] Date of Birth: 1981/03/29   Medicare Observation Status Notification Given:  Yes    Alvie Heidelberg, RN 05/16/2015, 9:17 AM

## 2015-05-17 ENCOUNTER — Encounter (HOSPITAL_COMMUNITY): Payer: Self-pay | Admitting: Emergency Medicine

## 2015-05-17 ENCOUNTER — Emergency Department (HOSPITAL_COMMUNITY)
Admission: EM | Admit: 2015-05-17 | Discharge: 2015-05-17 | Disposition: A | Discharge status: Home / Self Care | Payer: Medicare Other | Source: Home / Self Care | Attending: Emergency Medicine | Admitting: Emergency Medicine

## 2015-05-17 ENCOUNTER — Emergency Department (HOSPITAL_COMMUNITY)
Admission: EM | Admit: 2015-05-17 | Discharge: 2015-05-17 | Discharge status: Against Medical Advice | Payer: Medicare Other | Attending: Emergency Medicine | Admitting: Emergency Medicine

## 2015-05-17 ENCOUNTER — Encounter (HOSPITAL_COMMUNITY): Payer: Self-pay

## 2015-05-17 DIAGNOSIS — Z5321 Procedure and treatment not carried out due to patient leaving prior to being seen by health care provider: Secondary | ICD-10-CM | POA: Diagnosis not present

## 2015-05-17 DIAGNOSIS — R000 Tachycardia, unspecified: Secondary | ICD-10-CM | POA: Diagnosis not present

## 2015-05-17 DIAGNOSIS — I120 Hypertensive chronic kidney disease with stage 5 chronic kidney disease or end stage renal disease: Secondary | ICD-10-CM | POA: Insufficient documentation

## 2015-05-17 DIAGNOSIS — Z9119 Patient's noncompliance with other medical treatment and regimen: Secondary | ICD-10-CM | POA: Diagnosis not present

## 2015-05-17 DIAGNOSIS — N186 End stage renal disease: Secondary | ICD-10-CM | POA: Insufficient documentation

## 2015-05-17 DIAGNOSIS — Z862 Personal history of diseases of the blood and blood-forming organs and certain disorders involving the immune mechanism: Secondary | ICD-10-CM | POA: Diagnosis not present

## 2015-05-17 DIAGNOSIS — I509 Heart failure, unspecified: Secondary | ICD-10-CM

## 2015-05-17 DIAGNOSIS — Z5329 Procedure and treatment not carried out because of patient's decision for other reasons: Secondary | ICD-10-CM

## 2015-05-17 DIAGNOSIS — F17210 Nicotine dependence, cigarettes, uncomplicated: Secondary | ICD-10-CM | POA: Insufficient documentation

## 2015-05-17 DIAGNOSIS — Z720 Tobacco use: Secondary | ICD-10-CM | POA: Diagnosis not present

## 2015-05-17 DIAGNOSIS — F209 Schizophrenia, unspecified: Secondary | ICD-10-CM | POA: Insufficient documentation

## 2015-05-17 DIAGNOSIS — Z992 Dependence on renal dialysis: Secondary | ICD-10-CM | POA: Insufficient documentation

## 2015-05-17 DIAGNOSIS — Z91199 Patient's noncompliance with other medical treatment and regimen due to unspecified reason: Secondary | ICD-10-CM

## 2015-05-17 DIAGNOSIS — Z5320 Procedure and treatment not carried out because of patient's decision for unspecified reasons: Secondary | ICD-10-CM

## 2015-05-17 DIAGNOSIS — F319 Bipolar disorder, unspecified: Secondary | ICD-10-CM | POA: Diagnosis not present

## 2015-05-17 LAB — I-STAT CHEM 8, ED
BUN: 119 mg/dL — ABNORMAL HIGH (ref 6–20)
CREATININE: 17 mg/dL — AB (ref 0.44–1.00)
Calcium, Ion: 1.1 mmol/L — ABNORMAL LOW (ref 1.12–1.23)
Chloride: 104 mmol/L (ref 101–111)
GLUCOSE: 102 mg/dL — AB (ref 65–99)
HCT: 30 % — ABNORMAL LOW (ref 36.0–46.0)
HEMOGLOBIN: 10.2 g/dL — AB (ref 12.0–15.0)
POTASSIUM: 5.7 mmol/L — AB (ref 3.5–5.1)
Sodium: 136 mmol/L (ref 135–145)
TCO2: 18 mmol/L (ref 0–100)

## 2015-05-17 LAB — CBC WITH DIFFERENTIAL/PLATELET
BASOS ABS: 0 10*3/uL (ref 0.0–0.1)
Basophils Relative: 0 %
Eosinophils Absolute: 0.3 10*3/uL (ref 0.0–0.7)
Eosinophils Relative: 5 %
HEMATOCRIT: 25.9 % — AB (ref 36.0–46.0)
Hemoglobin: 8.1 g/dL — ABNORMAL LOW (ref 12.0–15.0)
LYMPHS ABS: 1.1 10*3/uL (ref 0.7–4.0)
LYMPHS PCT: 19 %
MCH: 26 pg (ref 26.0–34.0)
MCHC: 31.3 g/dL (ref 30.0–36.0)
MCV: 83.3 fL (ref 78.0–100.0)
MONO ABS: 0.6 10*3/uL (ref 0.1–1.0)
MONOS PCT: 11 %
NEUTROS ABS: 3.9 10*3/uL (ref 1.7–7.7)
Neutrophils Relative %: 65 %
Platelets: 208 10*3/uL (ref 150–400)
RBC: 3.11 MIL/uL — ABNORMAL LOW (ref 3.87–5.11)
RDW: 19.5 % — AB (ref 11.5–15.5)
WBC: 6 10*3/uL (ref 4.0–10.5)

## 2015-05-17 LAB — CBC
HCT: 25.7 % — ABNORMAL LOW (ref 36.0–46.0)
Hemoglobin: 8.4 g/dL — ABNORMAL LOW (ref 12.0–15.0)
MCH: 27.1 pg (ref 26.0–34.0)
MCHC: 32.7 g/dL (ref 30.0–36.0)
MCV: 82.9 fL (ref 78.0–100.0)
Platelets: 225 10*3/uL (ref 150–400)
RBC: 3.1 MIL/uL — ABNORMAL LOW (ref 3.87–5.11)
RDW: 19.9 % — ABNORMAL HIGH (ref 11.5–15.5)
WBC: 6.3 10*3/uL (ref 4.0–10.5)

## 2015-05-17 LAB — RENAL FUNCTION PANEL
Albumin: 2.8 g/dL — ABNORMAL LOW (ref 3.5–5.0)
Anion gap: 27 — ABNORMAL HIGH (ref 5–15)
BUN: 135 mg/dL — ABNORMAL HIGH (ref 6–20)
CO2: 15 mmol/L — ABNORMAL LOW (ref 22–32)
Calcium: 9.9 mg/dL (ref 8.9–10.3)
Chloride: 96 mmol/L — ABNORMAL LOW (ref 101–111)
Creatinine, Ser: 17.47 mg/dL — ABNORMAL HIGH (ref 0.44–1.00)
GFR calc Af Amer: 3 mL/min — ABNORMAL LOW (ref >60)
GFR calc non Af Amer: 2 mL/min — ABNORMAL LOW (ref >60)
Glucose, Bld: 76 mg/dL (ref 65–99)
Phosphorus: 12.3 mg/dL — ABNORMAL HIGH (ref 2.5–4.6)
Potassium: 5.9 mmol/L — ABNORMAL HIGH (ref 3.5–5.1)
Sodium: 138 mmol/L (ref 135–145)

## 2015-05-17 MED ORDER — LIDOCAINE HCL (PF) 1 % IJ SOLN: 5.0000 mL | INTRAMUSCULAR | Status: DC | PRN

## 2015-05-17 MED ORDER — SODIUM CHLORIDE 0.9 % IV SOLN: 100.0000 mL | INTRAVENOUS | Status: DC | PRN

## 2015-05-17 MED ORDER — METOPROLOL TARTRATE 25 MG PO TABS
50.0000 mg | ORAL_TABLET | Freq: Once | ORAL | Status: DC
  Filled 2015-05-17: qty 2

## 2015-05-17 MED ORDER — HYDROXYZINE HCL 25 MG PO TABS
25.0000 mg | ORAL_TABLET | Freq: Once | ORAL | Status: AC
  Administered 2015-05-17: 25 mg via ORAL

## 2015-05-17 MED ORDER — PENTAFLUOROPROP-TETRAFLUOROETH EX AERO
1.0000 "application " | INHALATION_SPRAY | CUTANEOUS | Status: DC | PRN
  Filled 2015-05-17: qty 30

## 2015-05-17 MED ORDER — VANCOMYCIN HCL IN DEXTROSE 750-5 MG/150ML-% IV SOLN
INTRAVENOUS | Status: AC
  Filled 2015-05-17: qty 150

## 2015-05-17 MED ORDER — ACETAMINOPHEN 325 MG PO TABS
650.0000 mg | ORAL_TABLET | Freq: Once | ORAL | Status: AC
  Administered 2015-05-17: 650 mg via ORAL

## 2015-05-17 MED ORDER — ACETAMINOPHEN 325 MG PO TABS
ORAL_TABLET | ORAL | Status: AC
  Administered 2015-05-17: 650 mg via ORAL
  Filled 2015-05-17: qty 2

## 2015-05-17 MED ORDER — HYDROXYZINE HCL 25 MG PO TABS
ORAL_TABLET | ORAL | Status: AC
  Administered 2015-05-17: 25 mg via ORAL
  Filled 2015-05-17: qty 1

## 2015-05-17 MED ORDER — DARBEPOETIN ALFA 200 MCG/0.4ML IJ SOSY: 200.0000 ug | PREFILLED_SYRINGE | INTRAMUSCULAR | Status: DC

## 2015-05-17 MED ORDER — LIDOCAINE-PRILOCAINE 2.5-2.5 % EX CREA
1.0000 "application " | TOPICAL_CREAM | CUTANEOUS | Status: DC | PRN
  Filled 2015-05-17: qty 5

## 2015-05-17 MED ORDER — ALTEPLASE 2 MG IJ SOLR
2.0000 mg | Freq: Once | INTRAMUSCULAR | Status: DC | PRN
  Filled 2015-05-17: qty 2

## 2015-05-17 MED ORDER — SODIUM CHLORIDE 0.9 % IV SOLN: 125.0000 mg | Freq: Once | INTRAVENOUS | Status: DC

## 2015-05-17 NOTE — ED Notes (Signed)
While this nurse was answering a call from the dialysis clinic referencing the patient repeatedly calling the clinic directly about when she will be dialyzed, the patient told the unit secretary that she needed to retreive a paper from dialysis.  During this nurse's conversation with the dialysis clinic the dialysis nurse had to quickly end her conversation to call security due to the patient's aggressive behavior in the dialysis clinic.  Shortly after this the patient arrived back to her room in [facility] and was still showing aggressive behavior toward staff and other patients that walked by her.  After calming down for a few minutes the patient asked how much longer until she would be dialyzed, she was told it would be in the afternoon, no earlier than 2 or 3 pm.  The patient became upset and decided to leave.  The risks were explained to the patient, including death, considering she hasn't been dialyzed since 05/10/2016.  The patient refused to wait and stated that she will come back at 2pm.  It was again explained that if she leaves her place in the queue will not be held.  The patient collected her belongings and walked out.

## 2015-05-17 NOTE — Progress Notes (Signed)
Sara [name] arrived on unit approx. 1715 escorted by security , alert and oriented . At 1745 pt making derogatory remarks towards security guard and matt rn calling him a " a hater". At 1750 2 fifteen g needles inserted without difficulty , tx started without difficulty. At [name] pt requested me to remove arterial needle stating it was " too low" arterial pressure was 130 at present time with no noted abnormalities. I removed arterial pressure at pts insistence . , at [name] on attempting to restart tx clotting was noted ( pt did not receive heparin per orders ). The pt became increasing agitated cursing out loud and stating that i had intentional hurt her on removing arterial needle. i repeatedly told her that did and would not do that and the needle was removed per policy. Pt continued to curse at me at which time i told her she would have to calm down or the tx would be terminated and she would be escorted off premises by security( security already had orders to escort pt off premises after tx). Pt responded " fine go ahead and take me off ". Dr Carollee Leitz was called and authorized terminating tx after being informed of pt abusive threatening behavior. Needles were removed by matt rn at pts insistence. At approximately 1925 pt was escorted off premises by security , pt was alert and oriented with no apparent physical distress.

## 2015-05-17 NOTE — Progress Notes (Signed)
Approximately 0930 this morning the patient entered the hemodialysis unit unsupervised from the ED.  Pt came to nurses station yelling and demanding that she be put on dialysis now.  I explained to patient that we cannot dialyze her at the present time and she must return to the ED.  Pt continued to scream at me and demand a time that she will be put on and continued to be very aggressive and disruptive to the hemodialysis work flow.  I asked her to please leave the unit immediately, pt continued to yell and scream in my face.  HD secretary called for security to escort pt off unit.  While awaiting patient would not stop so I escorted pt to hallway to prevent further disruption/distraction of our other patients that were currently on the unit.  Security arrived minutes later.

## 2015-05-17 NOTE — ED Notes (Signed)
PT HERE FOR DIALYSIS. hasn't dialysised since Friday.

## 2015-05-17 NOTE — ED Notes (Addendum)
Pt hasn't had dialysis since Friday but came in and had some issues with the doctor. Was supposed to have dialysis in [place] yesterday and didn't make it.
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
## 2015-05-17 NOTE — ED Notes (Signed)
Patient ambulated to bathroom without assistance. 

## 2015-05-17 NOTE — ED Notes (Signed)
Called for triage but pt is smoking

## 2015-05-19 ENCOUNTER — Emergency Department (HOSPITAL_COMMUNITY)
Admission: EM | Admit: 2015-05-19 | Discharge: 2015-05-19 | Disposition: A | Discharge status: Home / Self Care | Payer: Medicare Other | Attending: Emergency Medicine | Admitting: Emergency Medicine

## 2015-05-19 ENCOUNTER — Encounter (HOSPITAL_COMMUNITY): Payer: Self-pay | Admitting: Emergency Medicine

## 2015-05-19 DIAGNOSIS — F17210 Nicotine dependence, cigarettes, uncomplicated: Secondary | ICD-10-CM | POA: Diagnosis not present

## 2015-05-19 DIAGNOSIS — I120 Hypertensive chronic kidney disease with stage 5 chronic kidney disease or end stage renal disease: Secondary | ICD-10-CM | POA: Diagnosis not present

## 2015-05-19 DIAGNOSIS — I509 Heart failure, unspecified: Secondary | ICD-10-CM | POA: Insufficient documentation

## 2015-05-19 DIAGNOSIS — Z8659 Personal history of other mental and behavioral disorders: Secondary | ICD-10-CM | POA: Diagnosis not present

## 2015-05-19 DIAGNOSIS — Z992 Dependence on renal dialysis: Secondary | ICD-10-CM | POA: Insufficient documentation

## 2015-05-19 DIAGNOSIS — Z862 Personal history of diseases of the blood and blood-forming organs and certain disorders involving the immune mechanism: Secondary | ICD-10-CM | POA: Insufficient documentation

## 2015-05-19 DIAGNOSIS — N186 End stage renal disease: Secondary | ICD-10-CM | POA: Diagnosis not present

## 2015-05-19 DIAGNOSIS — R69 Illness, unspecified: Secondary | ICD-10-CM | POA: Diagnosis not present

## 2015-05-19 LAB — RENAL FUNCTION PANEL
Albumin: 2.7 g/dL — ABNORMAL LOW (ref 3.5–5.0)
Anion gap: 20 — ABNORMAL HIGH (ref 5–15)
BUN: 117 mg/dL — AB (ref 6–20)
CHLORIDE: 99 mmol/L — AB (ref 101–111)
CO2: 17 mmol/L — AB (ref 22–32)
CREATININE: 15.15 mg/dL — AB (ref 0.44–1.00)
Calcium: 9.1 mg/dL (ref 8.9–10.3)
GFR calc Af Amer: 3 mL/min — ABNORMAL LOW (ref >60)
GFR calc non Af Amer: 3 mL/min — ABNORMAL LOW (ref >60)
Glucose, Bld: 112 mg/dL — ABNORMAL HIGH (ref 65–99)
Phosphorus: 11.6 mg/dL — ABNORMAL HIGH (ref 2.5–4.6)
Potassium: 5.6 mmol/L — ABNORMAL HIGH (ref 3.5–5.1)
Sodium: 136 mmol/L (ref 135–145)

## 2015-05-19 LAB — CBC
HCT: 24.6 % — ABNORMAL LOW (ref 36.0–46.0)
Hemoglobin: 8.2 g/dL — ABNORMAL LOW (ref 12.0–15.0)
MCH: 27.7 pg (ref 26.0–34.0)
MCHC: 33.3 g/dL (ref 30.0–36.0)
MCV: 83.1 fL (ref 78.0–100.0)
PLATELETS: 232 10*3/uL (ref 150–400)
RBC: 2.96 MIL/uL — ABNORMAL LOW (ref 3.87–5.11)
RDW: 19.6 % — AB (ref 11.5–15.5)
WBC: 5.2 10*3/uL (ref 4.0–10.5)

## 2015-05-19 NOTE — Progress Notes (Signed)
Patient arrived to the HD unit. She is verbally aggressive to staff and Nephrologist. Security was been called to the unit and with their presence patient seemed to deescalate. An hour into the treatment patient states that her avf has infiltrated. AVF has been inspected with absolutely no signs of infiltration. HD settings are within normal limits and there is no swelling at either needle insertion sites. Patient demands to be taken off HD. Rinsing process began, but patient refused to have her blood returned. She proceeded to call staff "bitches". Needles have been removed and patient discharged from HD

## 2015-05-19 NOTE — ED Notes (Signed)
Food tray provided to patient
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
## 2015-05-19 NOTE — ED Notes (Signed)
Patient here for routine dialysis. Denies all other complaints.

## 2015-05-19 NOTE — ED Notes (Signed)
RN DAVID AWARE OF PT BLOOD PRESSURE. UNABLE TO GET A TEMP DUE TO PT HAVING SOMETHING COLD TO DRINK.

## 2015-05-19 NOTE — Telephone Encounter (Signed)
Ultram is a short term pain medication patient was to take until she is seen by rheumatology (referral made at last visit). I do not feel comfortable refilling this medication.

## 2015-05-21 ENCOUNTER — Encounter (HOSPITAL_COMMUNITY): Payer: Self-pay | Admitting: *Deleted

## 2015-05-21 ENCOUNTER — Encounter: Payer: Self-pay | Admitting: Internal Medicine

## 2015-05-21 ENCOUNTER — Emergency Department (HOSPITAL_COMMUNITY)
Admission: EM | Admit: 2015-05-21 | Discharge: 2015-05-21 | Disposition: A | Discharge status: Home / Self Care | Payer: Medicare Other | Source: Home / Self Care | Attending: Emergency Medicine | Admitting: Emergency Medicine

## 2015-05-21 ENCOUNTER — Other Ambulatory Visit: Payer: Self-pay | Admitting: Internal Medicine

## 2015-05-21 DIAGNOSIS — E1122 Type 2 diabetes mellitus with diabetic chronic kidney disease: Secondary | ICD-10-CM | POA: Diagnosis not present

## 2015-05-21 DIAGNOSIS — E1129 Type 2 diabetes mellitus with other diabetic kidney complication: Secondary | ICD-10-CM | POA: Diagnosis not present

## 2015-05-21 DIAGNOSIS — Z9115 Patient's noncompliance with renal dialysis: Secondary | ICD-10-CM

## 2015-05-21 DIAGNOSIS — Z992 Dependence on renal dialysis: Secondary | ICD-10-CM | POA: Insufficient documentation

## 2015-05-21 DIAGNOSIS — T8249XA Other complication of vascular dialysis catheter, initial encounter: Secondary | ICD-10-CM | POA: Insufficient documentation

## 2015-05-21 DIAGNOSIS — J069 Acute upper respiratory infection, unspecified: Secondary | ICD-10-CM | POA: Diagnosis not present

## 2015-05-21 DIAGNOSIS — F17210 Nicotine dependence, cigarettes, uncomplicated: Secondary | ICD-10-CM

## 2015-05-21 DIAGNOSIS — N186 End stage renal disease: Secondary | ICD-10-CM | POA: Insufficient documentation

## 2015-05-21 DIAGNOSIS — Z9114 Patient's other noncompliance with medication regimen: Secondary | ICD-10-CM | POA: Diagnosis not present

## 2015-05-21 DIAGNOSIS — M3214 Glomerular disease in systemic lupus erythematosus: Secondary | ICD-10-CM | POA: Diagnosis not present

## 2015-05-21 DIAGNOSIS — Z881 Allergy status to other antibiotic agents status: Secondary | ICD-10-CM

## 2015-05-21 DIAGNOSIS — I132 Hypertensive heart and chronic kidney disease with heart failure and with stage 5 chronic kidney disease, or end stage renal disease: Secondary | ICD-10-CM | POA: Diagnosis present

## 2015-05-21 DIAGNOSIS — Z9109 Other allergy status, other than to drugs and biological substances: Secondary | ICD-10-CM | POA: Diagnosis not present

## 2015-05-21 DIAGNOSIS — F2089 Other schizophrenia: Secondary | ICD-10-CM | POA: Diagnosis not present

## 2015-05-21 DIAGNOSIS — Z6823 Body mass index (BMI) 23.0-23.9, adult: Secondary | ICD-10-CM | POA: Diagnosis not present

## 2015-05-21 DIAGNOSIS — E11649 Type 2 diabetes mellitus with hypoglycemia without coma: Secondary | ICD-10-CM | POA: Diagnosis present

## 2015-05-21 DIAGNOSIS — E871 Hypo-osmolality and hyponatremia: Secondary | ICD-10-CM | POA: Diagnosis present

## 2015-05-21 DIAGNOSIS — F200 Paranoid schizophrenia: Secondary | ICD-10-CM | POA: Diagnosis not present

## 2015-05-21 DIAGNOSIS — Z9119 Patient's noncompliance with other medical treatment and regimen: Secondary | ICD-10-CM

## 2015-05-21 DIAGNOSIS — F919 Conduct disorder, unspecified: Secondary | ICD-10-CM | POA: Diagnosis present

## 2015-05-21 DIAGNOSIS — I5022 Chronic systolic (congestive) heart failure: Secondary | ICD-10-CM | POA: Diagnosis present

## 2015-05-21 DIAGNOSIS — R4689 Other symptoms and signs involving appearance and behavior: Secondary | ICD-10-CM | POA: Diagnosis not present

## 2015-05-21 DIAGNOSIS — E46 Unspecified protein-calorie malnutrition: Secondary | ICD-10-CM | POA: Diagnosis present

## 2015-05-21 DIAGNOSIS — I509 Heart failure, unspecified: Secondary | ICD-10-CM | POA: Insufficient documentation

## 2015-05-21 DIAGNOSIS — Y658 Other specified misadventures during surgical and medical care: Secondary | ICD-10-CM | POA: Insufficient documentation

## 2015-05-21 DIAGNOSIS — Z888 Allergy status to other drugs, medicaments and biological substances status: Secondary | ICD-10-CM

## 2015-05-21 DIAGNOSIS — E875 Hyperkalemia: Secondary | ICD-10-CM | POA: Diagnosis not present

## 2015-05-21 DIAGNOSIS — D631 Anemia in chronic kidney disease: Secondary | ICD-10-CM | POA: Diagnosis not present

## 2015-05-21 DIAGNOSIS — I120 Hypertensive chronic kidney disease with stage 5 chronic kidney disease or end stage renal disease: Secondary | ICD-10-CM

## 2015-05-21 DIAGNOSIS — I429 Cardiomyopathy, unspecified: Secondary | ICD-10-CM | POA: Diagnosis present

## 2015-05-21 DIAGNOSIS — Z046 Encounter for general psychiatric examination, requested by authority: Secondary | ICD-10-CM | POA: Diagnosis not present

## 2015-05-21 DIAGNOSIS — F1910 Other psychoactive substance abuse, uncomplicated: Secondary | ICD-10-CM | POA: Diagnosis present

## 2015-05-21 DIAGNOSIS — Z781 Physical restraint status: Secondary | ICD-10-CM

## 2015-05-21 DIAGNOSIS — D638 Anemia in other chronic diseases classified elsewhere: Secondary | ICD-10-CM | POA: Diagnosis not present

## 2015-05-21 NOTE — ED Provider Notes (Signed)
Patient eloped from the emergency department before provider evaluation. I did not see or evaluate this patient. Triage note states that patient reports her fistula needs surgery and she cannot have dialysis until it is repaired.  A shunt was never roomed for my evaluation. Several times I checked and was told that the patient was outside smoking. Nursing reports that when the tech attempted to room her she stated she was going to check out and call her surgeon.   Sara Soho [place] Sov[place], PA-C 05/21/15 1305  Charlesetta Shanks, MD 05/23/15 [phone]
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
## 2015-05-21 NOTE — ED Notes (Signed)
Pt acting out in the waiting room.  Yelling about the wait

## 2015-05-21 NOTE — ED Notes (Signed)
The pt reports that her fistula needs surgery and she cannot have dialysis until she gets  The fistula repaired.

## 2015-05-21 NOTE — ED Notes (Signed)
The pt has walked away reporting that she is not going to stay here

## 2015-05-21 NOTE — Telephone Encounter (Signed)
Pt is here and states that she has not been able to get into the rheumatologist appt. Because of conflicting schedules with Dialysis.  Pt needs this completed as soon as possible until she is able to get in with the Rheumatology.  Pt is making an appt with PCP, but nothing is available until March 7th.  Sadie Reynolds, ASA

## 2015-05-21 NOTE — ED Notes (Signed)
Tech came to take patient back to be seen by ED provider pt reports "I'm just going to check out, I will call my surgeon at his home."

## 2015-05-21 NOTE — ED Notes (Signed)
Unable to get temp at this time. °

## 2015-05-21 NOTE — ED Notes (Signed)
Pt came to nurse first desk reports, "I've been here since 330. They said they cleared four beds and that 1 person was in front of me. Why am I still out here. You have all these sick people just sitting out here." Informed pt that discharges are occurring as we speak and I am working on trying to get a room for her. Pt then reports, "Well I'm going outside to smoke a cigarette and have a room ready when I get back." [employer] officer came to talk to patient.

## 2015-05-22 ENCOUNTER — Inpatient Hospital Stay (HOSPITAL_COMMUNITY)
Admission: EM | Admit: 2015-05-22 | Discharge: 2015-05-31 | DRG: 640 | Discharge status: Against Medical Advice | Payer: Medicare Other | Attending: Pulmonary Disease | Admitting: Pulmonary Disease

## 2015-05-22 ENCOUNTER — Encounter (HOSPITAL_COMMUNITY): Payer: Self-pay | Admitting: Emergency Medicine

## 2015-05-22 DIAGNOSIS — G9340 Encephalopathy, unspecified: Secondary | ICD-10-CM | POA: Insufficient documentation

## 2015-05-22 DIAGNOSIS — E875 Hyperkalemia: Secondary | ICD-10-CM | POA: Diagnosis present

## 2015-05-22 DIAGNOSIS — F1910 Other psychoactive substance abuse, uncomplicated: Secondary | ICD-10-CM | POA: Diagnosis present

## 2015-05-22 DIAGNOSIS — F209 Schizophrenia, unspecified: Secondary | ICD-10-CM | POA: Diagnosis present

## 2015-05-22 DIAGNOSIS — D649 Anemia, unspecified: Secondary | ICD-10-CM | POA: Diagnosis present

## 2015-05-22 DIAGNOSIS — J9811 Atelectasis: Secondary | ICD-10-CM | POA: Insufficient documentation

## 2015-05-22 DIAGNOSIS — J811 Chronic pulmonary edema: Secondary | ICD-10-CM

## 2015-05-22 DIAGNOSIS — Z046 Encounter for general psychiatric examination, requested by authority: Secondary | ICD-10-CM | POA: Diagnosis not present

## 2015-05-22 DIAGNOSIS — D638 Anemia in other chronic diseases classified elsewhere: Secondary | ICD-10-CM | POA: Diagnosis not present

## 2015-05-22 DIAGNOSIS — Z992 Dependence on renal dialysis: Secondary | ICD-10-CM

## 2015-05-22 DIAGNOSIS — T829XXA Unspecified complication of cardiac and vascular prosthetic device, implant and graft, initial encounter: Secondary | ICD-10-CM | POA: Insufficient documentation

## 2015-05-22 DIAGNOSIS — N186 End stage renal disease: Secondary | ICD-10-CM

## 2015-05-22 LAB — I-STAT CHEM 8, ED
BUN: 138 mg/dL — AB (ref 6–20)
CREATININE: 17.1 mg/dL — AB (ref 0.44–1.00)
Calcium, Ion: 1.05 mmol/L — ABNORMAL LOW (ref 1.12–1.23)
Chloride: 101 mmol/L (ref 101–111)
GLUCOSE: 102 mg/dL — AB (ref 65–99)
HCT: 25 % — ABNORMAL LOW (ref 36.0–46.0)
HEMOGLOBIN: 8.5 g/dL — AB (ref 12.0–15.0)
POTASSIUM: 6.5 mmol/L — AB (ref 3.5–5.1)
Sodium: 133 mmol/L — ABNORMAL LOW (ref 135–145)
TCO2: 20 mmol/L (ref 0–100)

## 2015-05-22 LAB — CBC WITH DIFFERENTIAL/PLATELET
BASOS ABS: 0 10*3/uL (ref 0.0–0.1)
BASOS PCT: 0 %
EOS PCT: 1 %
Eosinophils Absolute: 0.1 10*3/uL (ref 0.0–0.7)
HCT: 24 % — ABNORMAL LOW (ref 36.0–46.0)
Hemoglobin: 7.9 g/dL — ABNORMAL LOW (ref 12.0–15.0)
LYMPHS PCT: 27 %
Lymphs Abs: 1.4 10*3/uL (ref 0.7–4.0)
MCH: 27.1 pg (ref 26.0–34.0)
MCHC: 32.9 g/dL (ref 30.0–36.0)
MCV: 82.2 fL (ref 78.0–100.0)
MONOS PCT: 9 %
Monocytes Absolute: 0.5 10*3/uL (ref 0.1–1.0)
NEUTROS ABS: 3 10*3/uL (ref 1.7–7.7)
Neutrophils Relative %: 63 %
PLATELETS: 232 10*3/uL (ref 150–400)
RBC: 2.92 MIL/uL — ABNORMAL LOW (ref 3.87–5.11)
RDW: 19.5 % — ABNORMAL HIGH (ref 11.5–15.5)
WBC: 5 10*3/uL (ref 4.0–10.5)

## 2015-05-22 LAB — ETHANOL

## 2015-05-22 MED ORDER — FOLIC ACID 1 MG PO TABS
1.0000 mg | ORAL_TABLET | Freq: Every day | ORAL | Status: DC
  Administered 2015-05-28 – 2015-05-31 (×2): 1 mg via ORAL
  Filled 2015-05-22 (×7): qty 1

## 2015-05-22 MED ORDER — DEXTROSE 50 % IV SOLN
1.0000 | Freq: Once | INTRAVENOUS | Status: AC
  Administered 2015-05-24: 50 mL via INTRAVENOUS

## 2015-05-22 MED ORDER — VITAMIN B-1 100 MG PO TABS
100.0000 mg | ORAL_TABLET | Freq: Every day | ORAL | Status: DC
  Administered 2015-05-31: 100 mg via ORAL
  Filled 2015-05-22 (×7): qty 1

## 2015-05-22 MED ORDER — THIAMINE HCL 100 MG/ML IJ SOLN
100.0000 mg | Freq: Every day | INTRAMUSCULAR | Status: DC
  Administered 2015-05-25 – 2015-05-30 (×6): 100 mg via INTRAVENOUS
  Filled 2015-05-22 (×7): qty 1

## 2015-05-22 MED ORDER — ADULT MULTIVITAMIN W/MINERALS CH
1.0000 | ORAL_TABLET | Freq: Every day | ORAL | Status: DC
  Administered 2015-05-28 – 2015-05-31 (×2): 1 via ORAL
  Filled 2015-05-22 (×7): qty 1

## 2015-05-22 MED ORDER — INSULIN ASPART 100 UNIT/ML IV SOLN
10.0000 [IU] | Freq: Once | INTRAVENOUS | Status: DC
  Filled 2015-05-22: qty 0.1
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
## 2015-05-22 NOTE — ED Notes (Signed)
Pt requests an ETA about transport to [facility].

## 2015-05-22 NOTE — BH Assessment (Signed)
Writer reviewed pt chart and requested TTS cart be placed for assessment-awaiting placement.
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
## 2015-05-22 NOTE — Progress Notes (Signed)
Patient was brought up to HD unit- she refused to be stuck saying her "arm is messed up"  She then proceeded to swear at the staff.  She is unable to be reasoned with- it is unsafe to attempt to put her on for her dialysis treatment at this time.  She will be sent to the ER with security- if she is sedated to the point where she can receive HD safely please let nephrology know , otherwise it will not be done.    Fritzi Scripter A

## 2015-05-22 NOTE — BHH Counselor (Signed)
Called to get TA machine placed for TTS Consult ordered at [place], TTS.    Per Nurse Jackelyn Poling, pt is "wild right now" and possibly going to be medicated.  Also, nurse told me pt is going to [facility] for Dialysis awaiting transport.  Nurse stated that she would have TTS Consult removed until pt is ready for assessment.  Faylene Kurtz, MS, CRC, [place] Triage Specialist [facility]

## 2015-05-23 DIAGNOSIS — F2089 Other schizophrenia: Secondary | ICD-10-CM | POA: Diagnosis not present

## 2015-05-23 DIAGNOSIS — D638 Anemia in other chronic diseases classified elsewhere: Secondary | ICD-10-CM

## 2015-05-23 DIAGNOSIS — Z046 Encounter for general psychiatric examination, requested by authority: Secondary | ICD-10-CM | POA: Diagnosis not present

## 2015-05-23 DIAGNOSIS — F1910 Other psychoactive substance abuse, uncomplicated: Secondary | ICD-10-CM | POA: Diagnosis present

## 2015-05-23 DIAGNOSIS — E875 Hyperkalemia: Secondary | ICD-10-CM | POA: Diagnosis not present

## 2015-05-23 DIAGNOSIS — N186 End stage renal disease: Secondary | ICD-10-CM | POA: Diagnosis not present

## 2015-05-23 DIAGNOSIS — F200 Paranoid schizophrenia: Secondary | ICD-10-CM | POA: Diagnosis not present

## 2015-05-23 DIAGNOSIS — R4689 Other symptoms and signs involving appearance and behavior: Secondary | ICD-10-CM | POA: Diagnosis not present

## 2015-05-23 LAB — RENAL FUNCTION PANEL
Albumin: 2.7 g/dL — ABNORMAL LOW (ref 3.5–5.0)
Anion gap: 24 — ABNORMAL HIGH (ref 5–15)
BUN: 158 mg/dL — AB (ref 6–20)
CHLORIDE: 96 mmol/L — AB (ref 101–111)
CO2: 15 mmol/L — ABNORMAL LOW (ref 22–32)
Calcium: 9.3 mg/dL (ref 8.9–10.3)
Creatinine, Ser: 18.42 mg/dL — ABNORMAL HIGH (ref 0.44–1.00)
GFR calc Af Amer: 3 mL/min — ABNORMAL LOW (ref >60)
GFR calc non Af Amer: 2 mL/min — ABNORMAL LOW (ref >60)
GLUCOSE: 100 mg/dL — AB (ref 65–99)
POTASSIUM: 6.8 mmol/L — AB (ref 3.5–5.1)
Phosphorus: 13.8 mg/dL — ABNORMAL HIGH (ref 2.5–4.6)
Sodium: 135 mmol/L (ref 135–145)

## 2015-05-23 LAB — CBC
HEMATOCRIT: 21.8 % — AB (ref 36.0–46.0)
Hemoglobin: 7.1 g/dL — ABNORMAL LOW (ref 12.0–15.0)
MCH: 26.1 pg (ref 26.0–34.0)
MCHC: 32.6 g/dL (ref 30.0–36.0)
MCV: 80.1 fL (ref 78.0–100.0)
Platelets: 250 10*3/uL (ref 150–400)
RBC: 2.72 MIL/uL — ABNORMAL LOW (ref 3.87–5.11)
RDW: 19.1 % — AB (ref 11.5–15.5)
WBC: 6.3 10*3/uL (ref 4.0–10.5)

## 2015-05-23 LAB — IRON AND TIBC
Iron: 51 ug/dL (ref 28–170)
Saturation Ratios: 20 % (ref 10.4–31.8)
TIBC: 252 ug/dL (ref 250–450)
UIBC: 201 ug/dL

## 2015-05-23 MED ORDER — DIPHENHYDRAMINE HCL 50 MG/ML IJ SOLN
INTRAMUSCULAR | Status: AC
  Administered 2015-05-23: 50 mg via INTRAVENOUS
  Filled 2015-05-23: qty 1

## 2015-05-23 MED ORDER — LIDOCAINE HCL (PF) 1 % IJ SOLN: 5.0000 mL | INTRAMUSCULAR | Status: DC | PRN

## 2015-05-23 MED ORDER — LIDOCAINE-PRILOCAINE 2.5-2.5 % EX CREA: 1.0000 "application " | TOPICAL_CREAM | CUTANEOUS | Status: DC | PRN

## 2015-05-23 MED ORDER — DIPHENHYDRAMINE HCL 50 MG/ML IJ SOLN
50.0000 mg | Freq: Once | INTRAMUSCULAR | Status: AC
Start: 2015-05-23 — End: 2015-05-23
  Administered 2015-05-23: 50 mg via INTRAVENOUS

## 2015-05-23 MED ORDER — SODIUM CHLORIDE 0.9% FLUSH
3.0000 mL | Freq: Two times a day (BID) | INTRAVENOUS | Status: DC
  Administered 2015-05-25 – 2015-05-30 (×4): 3 mL via INTRAVENOUS

## 2015-05-23 MED ORDER — DARBEPOETIN ALFA 200 MCG/0.4ML IJ SOSY
200.0000 ug | PREFILLED_SYRINGE | INTRAMUSCULAR | Status: DC
  Administered 2015-05-23: 200 ug via INTRAVENOUS
  Filled 2015-05-23: qty 0.4

## 2015-05-23 MED ORDER — GUAIFENESIN-CODEINE 100-10 MG/5ML PO SOLN
10.0000 mL | Freq: Once | ORAL | Status: AC
  Administered 2015-05-24: 10 mL via ORAL
  Filled 2015-05-23: qty 10

## 2015-05-23 MED ORDER — ONDANSETRON HCL 4 MG/2ML IJ SOLN: 4.0000 mg | Freq: Four times a day (QID) | INTRAMUSCULAR | Status: DC | PRN

## 2015-05-23 MED ORDER — ONDANSETRON HCL 4 MG PO TABS: 4.0000 mg | ORAL_TABLET | Freq: Four times a day (QID) | ORAL | Status: DC | PRN

## 2015-05-23 MED ORDER — DARBEPOETIN ALFA 200 MCG/0.4ML IJ SOSY
PREFILLED_SYRINGE | INTRAMUSCULAR | Status: AC
  Administered 2015-05-23: 200 ug via INTRAVENOUS
  Filled 2015-05-23: qty 0.4

## 2015-05-23 MED ORDER — PHENOL 1.4 % MT LIQD
1.0000 | OROMUCOSAL | Status: DC | PRN
  Administered 2015-05-23: 1 via OROMUCOSAL
  Filled 2015-05-23: qty 177

## 2015-05-23 MED ORDER — ALBUTEROL SULFATE (2.5 MG/3ML) 0.083% IN NEBU: 2.5000 mg | INHALATION_SOLUTION | RESPIRATORY_TRACT | Status: DC | PRN

## 2015-05-23 MED ORDER — DIAZEPAM 5 MG/ML IJ SOLN
5.0000 mg | Freq: Once | INTRAMUSCULAR | Status: DC
  Filled 2015-05-23 (×3): qty 2

## 2015-05-23 MED ORDER — SODIUM CHLORIDE 0.9 % IV SOLN: 100.0000 mL | INTRAVENOUS | Status: DC | PRN

## 2015-05-23 MED ORDER — SEVELAMER CARBONATE 800 MG PO TABS
1600.0000 mg | ORAL_TABLET | Freq: Three times a day (TID) | ORAL | Status: DC
Start: 2015-05-23 — End: 2015-05-31
  Administered 2015-05-31: 1600 mg via ORAL
  Filled 2015-05-23 (×23): qty 2

## 2015-05-23 MED ORDER — HYDROCODONE-ACETAMINOPHEN 5-325 MG PO TABS
1.0000 | ORAL_TABLET | ORAL | Status: DC | PRN
  Administered 2015-05-23 – 2015-05-31 (×5): 1 via ORAL
  Filled 2015-05-23 (×6): qty 1

## 2015-05-23 MED ORDER — TRIAMCINOLONE ACETONIDE 0.5 % EX OINT
TOPICAL_OINTMENT | Freq: Two times a day (BID) | CUTANEOUS | Status: DC
  Filled 2015-05-23 (×2): qty 15

## 2015-05-23 MED ORDER — HEPARIN SODIUM (PORCINE) 5000 UNIT/ML IJ SOLN
5000.0000 [IU] | Freq: Three times a day (TID) | INTRAMUSCULAR | Status: DC
  Administered 2015-05-24 – 2015-05-29 (×12): 5000 [IU] via SUBCUTANEOUS
  Filled 2015-05-23 (×21): qty 1

## 2015-05-23 MED ORDER — QUETIAPINE FUMARATE 50 MG PO TABS
50.0000 mg | ORAL_TABLET | Freq: Three times a day (TID) | ORAL | Status: DC
  Filled 2015-05-23 (×13): qty 1

## 2015-05-23 MED ORDER — ALTEPLASE 2 MG IJ SOLR
2.0000 mg | Freq: Once | INTRAMUSCULAR | Status: DC | PRN
  Filled 2015-05-23: qty 2

## 2015-05-23 MED ORDER — INSULIN ASPART 100 UNIT/ML ~~LOC~~ SOLN: 0.0000 [IU] | SUBCUTANEOUS | Status: DC

## 2015-05-23 MED ORDER — DIAZEPAM 5 MG/ML IJ SOLN
5.0000 mg | Freq: Four times a day (QID) | INTRAMUSCULAR | Status: DC | PRN
  Administered 2015-05-23 – 2015-05-24 (×2): 5 mg via INTRAMUSCULAR
  Filled 2015-05-23 (×2): qty 2

## 2015-05-23 MED ORDER — CAMPHOR-MENTHOL 0.5-0.5 % EX LOTN
1.0000 "application " | TOPICAL_LOTION | Freq: Three times a day (TID) | CUTANEOUS | Status: DC | PRN
  Filled 2015-05-23: qty 222

## 2015-05-23 MED ORDER — LIDOCAINE-PRILOCAINE 2.5-2.5 % EX CREA
1.0000 "application " | TOPICAL_CREAM | CUTANEOUS | Status: DC | PRN
  Filled 2015-05-23: qty 5

## 2015-05-23 MED ORDER — PENTAFLUOROPROP-TETRAFLUOROETH EX AERO: 1.0000 "application " | INHALATION_SPRAY | CUTANEOUS | Status: DC | PRN

## 2015-05-23 MED ORDER — HEPARIN SODIUM (PORCINE) 1000 UNIT/ML DIALYSIS: 1000.0000 [IU] | INTRAMUSCULAR | Status: DC | PRN

## 2015-05-23 MED ORDER — HYDROXYZINE HCL 25 MG PO TABS
25.0000 mg | ORAL_TABLET | Freq: Three times a day (TID) | ORAL | Status: DC | PRN
  Filled 2015-05-23: qty 1
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
## 2015-05-23 NOTE — Progress Notes (Signed)
Pt refusing to get IV access until she is out of restraints.
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
## 2015-05-23 NOTE — Procedures (Signed)
I have personally attended this patient's dialysis session.  Using uppermost part of aneurysmal AVF BFR 300 K pending (continue 1K bath pending that lab) Aranesp 200 today and get some Fe studies   Jamal Maes, MD [place] Pager 05/23/2015, 8:17 AM

## 2015-05-23 NOTE — Progress Notes (Signed)
CRITICAL VALUE ALERT  Critical value received:  Potassium 6.8  Date of notification:  05/22/14  Time of notification:  0820  Critical value read back:yes  Nurse who received alert:  Louretta Shorten RN  MD notified (1st page): Lorrene Reid- on HD unit  Time of first page:na  MD notified (2nd page):na  Time of second page:na  Responding MD: Lorrene Reid  Time MD responded:  38 Order to keep on 1K bath for 3 hours of HD

## 2015-05-23 NOTE — Progress Notes (Signed)
[facility] Rounding Note  Subjective:  Right now is  on HD Has a sitter Events leading to admission/commitment reviewed  She is very paranoid, told me I can "examine her after HD", gets more agitated with any conversation Angry about bumps on her legs Advised her that VVS needs to look at her AVF (probably needs revision) and she said "you make me an appointment - I'm going home after HD"   Objective Vital signs in last 24 hours: Filed Vitals:   05/22/15 1823 05/23/15 0707 05/23/15 0715 05/23/15 0730  BP: 156/111 156/102 168/118 168/120  Pulse: 95 96 108 106  Temp: 97.6 F (36.4 C)     TempSrc: Oral     Resp: 18 19    Weight:  69.3 kg (152 lb 12.5 oz)    SpO2: 100% 100%     Weight change:  No intake or output data in the 24 hours ending 05/23/15 0753   Physical Exam:   BP 168/120 mmHg  Pulse 106  Temp(Src) 97.6 F (36.4 C) (Oral)  Resp 19  Wt 69.3 kg (152 lb 12.5 oz)  SpO2 100% Unable to examine anything but her AVF (cannulated, has large and also a smaller aneurysm  won't let me touch it) and her LE's with 2-3+ edema and dark nodular bumps on her legs (prev dx as Kyrlie's ds)  Labs: Basic Metabolic Panel:  Recent Labs Lab 05/17/15 0501 05/17/15 2146 05/19/15 0853 05/22/15 1846 05/23/15 0715  NA 136 138 136 133* 135  K 5.7* 5.9* 5.6* 6.5* PENDING  CL 104 96* 99* 101 96*  CO2  --  15* 17*  --  15*  GLUCOSE 102* 76 112* 102* 100*  BUN 119* 135* 117* 138* 158*  CREATININE 17.00* 17.47* 15.15* 17.10* 18.42*  CALCIUM  --  9.9 9.1  --  9.3  PHOS  --  12.3* 11.6*  --  PENDING   Liver Function Tests:  Recent Labs Lab 05/17/15 2146 05/19/15 0853 05/23/15 0715  ALBUMIN 2.8* 2.7* 2.7*   CBC:  Recent Labs Lab 05/17/15 0453  05/17/15 2147 05/19/15 0854 05/22/15 1846 05/22/15 1849 05/23/15 0714  WBC 6.0  --  6.3 5.2  --  5.0 6.3  NEUTROABS 3.9  --   --   --   --  3.0  --   HGB 8.1*  < > 8.4* 8.2* 8.5* 7.9* 7.1*  HCT 25.9*  < > 25.7* 24.6* 25.0* 24.0* 21.8*   MCV 83.3  --  82.9 83.1  --  82.2 80.1  PLT 208  --  225 232  --  232 250  < > = values in this interval not displayed.   Medications:   . dextrose  1 ampule Intravenous Once  . folic acid  1 mg Oral Daily  . heparin  5,000 Units Subcutaneous 3 times per day  . insulin aspart  0-9 Units Subcutaneous 6 times per day  . insulin aspart  10 Units Intravenous Once  . multivitamin with minerals  1 tablet Oral Daily  . sodium chloride flush  3 mL Intravenous Q12H  . thiamine  100 mg Oral Daily   Or  . thiamine  100 mg Intravenous Daily  . triamcinolone ointment   Topical BID    Assessment/Recommendations  1. ESRD - has no dialysis home. Has been dismissed from unit in [place] due to behavioral issues. Gets her dialysis on a prn basis, coming through the ER each time (her continued aggressive and inappropriate behaviors have  been a major impediment to her receipt of care). She will not be able to dialyze anywhere in [place].  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 If she remains here for a few days (committed) will need to get VVS to look at her AVF. Because of volume and uremia will HD again tomorrow. 2. Bipolar/schizophrenia - MAJOR behavioral issues. Needs psyche care (and in point of fact needs to be institutionalized so that her disease can be controlled) 3. Anemia - Hb 7.1. Last Fe studies Nov 2016. Get Fe studies. 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 Phos 11-12 so needs binders. Will take advantage of current admission to check these things and get her on some binders if she will take them. Start Renvela with meals 2 TID ac  Jamal Maes, MD [facility] [phone] Pager 05/23/2015, 8:11 AM
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
## 2015-05-23 NOTE — Progress Notes (Signed)
PROGRESS NOTE  Sara [name] [name] DOB: January 02, 1981 DOA: 05/22/2015 PCP: No primary care provider on file. Brief History 35 year old female with a history of ESRD, polysubstance abuse, schizophrenia, systolic CHF, hypertension presented with the patient's mother placed involuntary commitment papers with a Civil Service fast streamer.  Patient is also known to be poor historian and polysubstance abuse. Patient is always oppositional, defiant, noncompliant with the treatment protocols and demands what she wants like benzodiazepines, pain medication and additional amount of food in the hospital. The patient currently has complaints of chronic pain "from head to toe"she was noted to have potassium 6.5, sodium 133 with EKG showing peaked T waves. The patient was admitted to [facility] for dialysis. Psychiatry was consulted to assist with management.  Assessment/Plan: Hyperkalemia:  -appreciate nephrology assistance -undergoing HD  ESRD on HD:  -Patient reports previously being on Tuesday, Thursday, Saturday schedule, but unclear exactly when last dialysis was this patient has a reported improperly working fistula. On admission, Cr 17.1 and BUN 138. - Has been dismissed from unit in [place] due to behavioral issues. Gets her dialysis on a prn basis, coming through the ER each time -She will not be able to dialyze anywhere in [place]  Involuntary commitment: Paperwork sign by magistrate involuntary commitment.  - appreciate psychiatry consult -case discussed with Dr. Sheliah Hatch like to keep inpatient in acute hospital up to a week to see if paranoid abates with meds/HD--if not, will need commitment to inpatient psychiatric care - Sitter to bedside   Anemia of CKD -Hemoglobin approximately 8 - CBC in am - Continue to monitor for  Mood disorder:Schizophrenia/bipolar.  -Patient has long history of noncompliance with taking her schizophrenia medications in the past. - Psychiatry Consult    -Patient continues to have aggressive behaviors -Patient tolerating diazepam IM -seroquel added by psychiatry  Reported polysubstance abuse:  -Initial alcohol level less than 5. Patient has a presumed allergy to Ativan. - Follow-up urine drug screen if patient still makes urine - CIWA protocols, using valium  Hyponatremia:  -Sodium 133.  - continue to monitor     Family Communication:   Pt at beside Disposition Plan:   Unclear.  Hospitalization > 3 days     Procedures/Studies: Dg Chest 2 View  04/24/2015  CLINICAL DATA:  Productive cough for 1 week, shortness of breath EXAM: CHEST  2 VIEW COMPARISON:  04/11/2015 FINDINGS: There are bilateral small pleural effusions. There is bilateral interstitial thickening. There is no pneumothorax. There is stable cardiomegaly. The osseous structures are unremarkable. IMPRESSION: Findings most consistent with CHF. Electronically Signed   By: Kathreen Devoid   On: 04/24/2015 16:12   Dg Chest Port 1 View  05/16/2015  CLINICAL DATA:  35 year old female with shortness of breath and cough EXAM: PORTABLE CHEST 1 VIEW COMPARISON:  Radiograph dated 04/24/2015 FINDINGS: There is stable cardiomegaly with increased vascular prominence compatible with congestive changes. There has been interval increase in vascular markings compared to prior study. No focal consolidation, pleural effusion, or pneumothorax. The osseous structures appear grossly unremarkable. Bilateral AC joints degenerative changes. IMPRESSION: Cardiomegaly with worsening of the congestive changes. Electronically Signed   By: Anner Crete M.D.   On: 05/16/2015 00:03   Dg Knee Complete 4 Views Right  04/24/2015  CLINICAL DATA:  Nontraumatic knee pain. EXAM: RIGHT KNEE - COMPLETE 4+ VIEW COMPARISON:  None. FINDINGS: No fracture, dislocation, or joint effusion. Mild degenerative changes seen in the medial compartment. IMPRESSION: No acute abnormality.  Medial compartment degenerative changes.  Electronically Signed   By: Dorise Bullion III M.D   On: 04/24/2015 16:14   Dg Hip Unilat With Pelvis 2-3 Views Right  04/24/2015  CLINICAL DATA:  Worsening right hip pain over the past 2-3 days. Initial encounter. EXAM: DG HIP (WITH OR WITHOUT PELVIS) 2-3V RIGHT COMPARISON:  Plain films right hip 01/25/2015 and 08/11/2014. FINDINGS: There is no evidence of hip fracture or dislocation. There is no evidence of arthropathy or other focal bone abnormality. IMPRESSION: Negative exam. Electronically Signed   By: Inge Rise M.D.   On: 04/24/2015 16:12         Subjective: Patient denies fevers, chills, headache, chest pain, dyspnea, nausea, vomiting, diarrhea, abdominal pain, dysuria, hematuria   Objective: Filed Vitals:   05/23/15 0825 05/23/15 0843 05/23/15 0854 05/23/15 0902  BP: 152/114 161/98 168/116   Pulse: 117 107 115   Temp:      TempSrc:      Resp:      Weight:    67.2 kg (148 lb 2.4 oz)  SpO2:        Intake/Output Summary (Last 24 hours) at 05/23/15 1740 Last data filed at 05/23/15 0902  Gross per 24 hour  Intake      0 ml  Output   1839 ml  Net  -1839 ml   Weight change:  Exam:   General:  Pt is alert, follows commands appropriately, not in acute distress  HEENT: No icterus, No thrush, No neck mass, [place]/AT  Cardiovascular: RRR, S1/S2, no rubs, no gallops  Respiratory: Bibasilar crackles. No wheezing. Good air movement  Abdomen: Soft/+BS, non tender, non distended, no guarding; no hepatosplenomegaly   Extremities: 1+ LE edema, No lymphangitis, No petechiae, No rashes, no synovitis  Data Reviewed: Basic Metabolic Panel:  Recent Labs Lab 05/17/15 0501 05/17/15 2146 05/19/15 0853 05/22/15 1846 05/23/15 0715  NA 136 138 136 133* 135  K 5.7* 5.9* 5.6* 6.5* 6.8*  CL 104 96* 99* 101 96*  CO2  --  15* 17*  --  15*  GLUCOSE 102* 76 112* 102* 100*  BUN 119* 135* 117* 138* 158*  CREATININE 17.00* 17.47* 15.15* 17.10* 18.42*  CALCIUM  --  9.9 9.1  --   9.3  PHOS  --  12.3* 11.6*  --  13.8*   Liver Function Tests:  Recent Labs Lab 05/17/15 2146 05/19/15 0853 05/23/15 0715  ALBUMIN 2.8* 2.7* 2.7*   No results for input(s): LIPASE, AMYLASE in the last 168 hours. No results for input(s): AMMONIA in the last 168 hours. CBC:  Recent Labs Lab 05/17/15 0453  05/17/15 2147 05/19/15 0854 05/22/15 1846 05/22/15 1849 05/23/15 0714  WBC 6.0  --  6.3 5.2  --  5.0 6.3  NEUTROABS 3.9  --   --   --   --  3.0  --   HGB 8.1*  < > 8.4* 8.2* 8.5* 7.9* 7.1*  HCT 25.9*  < > 25.7* 24.6* 25.0* 24.0* 21.8*  MCV 83.3  --  82.9 83.1  --  82.2 80.1  PLT 208  --  225 232  --  232 250  < > = values in this interval not displayed. Cardiac Enzymes: No results for input(s): CKTOTAL, CKMB, CKMBINDEX, TROPONINI in the last 168 hours. BNP: Invalid input(s): POCBNP CBG: No results for input(s): GLUCAP in the last 168 hours.  No results found for this or any previous visit (from the past 240 hour(s)).   Scheduled Meds: .  darbepoetin (ARANESP) injection - DIALYSIS  200 mcg Intravenous Q Wed-HD  . dextrose  1 ampule Intravenous Once  . diazepam  5 mg Intravenous Once  . folic acid  1 mg Oral Daily  . heparin  5,000 Units Subcutaneous 3 times per day  . insulin aspart  0-9 Units Subcutaneous 6 times per day  . insulin aspart  10 Units Intravenous Once  . multivitamin with minerals  1 tablet Oral Daily  . QUEtiapine  50 mg Oral TID  . sevelamer carbonate  1,600 mg Oral TID WC  . sodium chloride flush  3 mL Intravenous Q12H  . thiamine  100 mg Oral Daily   Or  . thiamine  100 mg Intravenous Daily  . triamcinolone ointment   Topical BID   Continuous Infusions:    Daviyon Widmayer, DO  [facility] Pager [phone]  If 7PM-7AM, please contact night-coverage [email or web address] Password [facility] 05/23/2015, 5:40 PM

## 2015-05-23 NOTE — Progress Notes (Signed)
Hemodialysis- Pt agreed to be brought to unit. Pt scratching all over body complaining of itching. Given bandaids for bleeding areas. Paged Dr. Moshe Cipro. Received order for iv benadryl. Sitter at bedside with pt. Pt currently cooperative. Given snacks and coffee. Continue to monitor.
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
## 2015-05-23 NOTE — Care Management Note (Addendum)
Case Management Note  Patient Details  Name: AUDRIELLE VANKUREN MRN: [id number] Date of Birth: [birth year]  Subjective/Objective:        CM following for progression and d/c planning.            Action/Plan:  05/23/2015 Noted consult re pt need of HD center. Please note that pt has been d/c from all local HD centers due to behaviors and currently comes to ED for acute HD needs. Clearly pt needs placement in a safe environment where her medical needs can be met. As she has been restricted from local HD centers any placement of this pt would have to take place out of the [place] area. Will discuss with CSW.   Expected Discharge Date:                  Expected Discharge Plan:  [place] vs Group Home vs institutionalization.   In-House Referral:  Clinical Social Work  Ship broker  CM Consult  Post Acute Care Choice:  NA Choice offered to:  NA  DME Arranged:    DME Agency:     HH Arranged:    HH Agency:     Status of Service:  In process, will continue to follow  Medicare Important Message Given:    Date Medicare IM Given:    Medicare IM give by:    Date Additional Medicare IM Given:    Additional Medicare Important Message give by:     If discussed at [place] of Stay Meetings, dates discussed:    Additional Comments:  Adron Bene, RN 05/23/2015, 10:07 AM
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
## 2015-05-23 NOTE — Progress Notes (Signed)
Pt requesting pain medicine. Bought pain medicine in for patient and she refused because she "doesn't trust me." Pt also refusing all other PO medications.

## 2015-05-24 DIAGNOSIS — Z9119 Patient's noncompliance with other medical treatment and regimen: Secondary | ICD-10-CM | POA: Diagnosis not present

## 2015-05-24 DIAGNOSIS — J90 Pleural effusion, not elsewhere classified: Secondary | ICD-10-CM | POA: Diagnosis not present

## 2015-05-24 DIAGNOSIS — Z9115 Patient's noncompliance with renal dialysis: Secondary | ICD-10-CM | POA: Diagnosis not present

## 2015-05-24 DIAGNOSIS — F200 Paranoid schizophrenia: Secondary | ICD-10-CM | POA: Diagnosis not present

## 2015-05-24 DIAGNOSIS — D631 Anemia in chronic kidney disease: Secondary | ICD-10-CM | POA: Diagnosis not present

## 2015-05-24 DIAGNOSIS — F1910 Other psychoactive substance abuse, uncomplicated: Secondary | ICD-10-CM

## 2015-05-24 DIAGNOSIS — F2089 Other schizophrenia: Secondary | ICD-10-CM

## 2015-05-24 DIAGNOSIS — I429 Cardiomyopathy, unspecified: Secondary | ICD-10-CM | POA: Diagnosis not present

## 2015-05-24 DIAGNOSIS — F17210 Nicotine dependence, cigarettes, uncomplicated: Secondary | ICD-10-CM | POA: Diagnosis present

## 2015-05-24 DIAGNOSIS — T82898A Other specified complication of vascular prosthetic devices, implants and grafts, initial encounter: Secondary | ICD-10-CM | POA: Diagnosis not present

## 2015-05-24 DIAGNOSIS — I120 Hypertensive chronic kidney disease with stage 5 chronic kidney disease or end stage renal disease: Secondary | ICD-10-CM | POA: Diagnosis not present

## 2015-05-24 DIAGNOSIS — Z046 Encounter for general psychiatric examination, requested by authority: Secondary | ICD-10-CM | POA: Diagnosis not present

## 2015-05-24 DIAGNOSIS — E11649 Type 2 diabetes mellitus with hypoglycemia without coma: Secondary | ICD-10-CM | POA: Diagnosis present

## 2015-05-24 DIAGNOSIS — G9340 Encephalopathy, unspecified: Secondary | ICD-10-CM | POA: Diagnosis not present

## 2015-05-24 DIAGNOSIS — F919 Conduct disorder, unspecified: Secondary | ICD-10-CM | POA: Diagnosis present

## 2015-05-24 DIAGNOSIS — Z992 Dependence on renal dialysis: Secondary | ICD-10-CM | POA: Diagnosis not present

## 2015-05-24 DIAGNOSIS — E46 Unspecified protein-calorie malnutrition: Secondary | ICD-10-CM | POA: Diagnosis present

## 2015-05-24 DIAGNOSIS — Z9114 Patient's other noncompliance with medication regimen: Secondary | ICD-10-CM | POA: Diagnosis not present

## 2015-05-24 DIAGNOSIS — Z6823 Body mass index (BMI) 23.0-23.9, adult: Secondary | ICD-10-CM | POA: Diagnosis not present

## 2015-05-24 DIAGNOSIS — N186 End stage renal disease: Secondary | ICD-10-CM | POA: Diagnosis not present

## 2015-05-24 DIAGNOSIS — E871 Hypo-osmolality and hyponatremia: Secondary | ICD-10-CM | POA: Diagnosis present

## 2015-05-24 DIAGNOSIS — Z781 Physical restraint status: Secondary | ICD-10-CM | POA: Diagnosis not present

## 2015-05-24 DIAGNOSIS — E875 Hyperkalemia: Secondary | ICD-10-CM | POA: Diagnosis not present

## 2015-05-24 DIAGNOSIS — J069 Acute upper respiratory infection, unspecified: Secondary | ICD-10-CM | POA: Diagnosis present

## 2015-05-24 DIAGNOSIS — Z9109 Other allergy status, other than to drugs and biological substances: Secondary | ICD-10-CM | POA: Diagnosis not present

## 2015-05-24 DIAGNOSIS — J9811 Atelectasis: Secondary | ICD-10-CM | POA: Diagnosis not present

## 2015-05-24 DIAGNOSIS — E1129 Type 2 diabetes mellitus with other diabetic kidney complication: Secondary | ICD-10-CM | POA: Diagnosis not present

## 2015-05-24 DIAGNOSIS — I5022 Chronic systolic (congestive) heart failure: Secondary | ICD-10-CM | POA: Diagnosis present

## 2015-05-24 DIAGNOSIS — E1122 Type 2 diabetes mellitus with diabetic chronic kidney disease: Secondary | ICD-10-CM | POA: Diagnosis present

## 2015-05-24 DIAGNOSIS — I132 Hypertensive heart and chronic kidney disease with heart failure and with stage 5 chronic kidney disease, or end stage renal disease: Secondary | ICD-10-CM | POA: Diagnosis not present

## 2015-05-24 DIAGNOSIS — F209 Schizophrenia, unspecified: Secondary | ICD-10-CM | POA: Diagnosis not present

## 2015-05-24 DIAGNOSIS — Z881 Allergy status to other antibiotic agents status: Secondary | ICD-10-CM | POA: Diagnosis not present

## 2015-05-24 DIAGNOSIS — Z888 Allergy status to other drugs, medicaments and biological substances status: Secondary | ICD-10-CM | POA: Diagnosis not present

## 2015-05-24 DIAGNOSIS — R4689 Other symptoms and signs involving appearance and behavior: Secondary | ICD-10-CM | POA: Diagnosis not present

## 2015-05-24 DIAGNOSIS — M3214 Glomerular disease in systemic lupus erythematosus: Secondary | ICD-10-CM | POA: Diagnosis present

## 2015-05-24 DIAGNOSIS — F29 Unspecified psychosis not due to a substance or known physiological condition: Secondary | ICD-10-CM | POA: Diagnosis not present

## 2015-05-24 LAB — CBC
HEMATOCRIT: 23.4 % — AB (ref 36.0–46.0)
Hemoglobin: 7.5 g/dL — ABNORMAL LOW (ref 12.0–15.0)
MCH: 26 pg (ref 26.0–34.0)
MCHC: 32.1 g/dL (ref 30.0–36.0)
MCV: 81 fL (ref 78.0–100.0)
Platelets: 196 10*3/uL (ref 150–400)
RBC: 2.89 MIL/uL — ABNORMAL LOW (ref 3.87–5.11)
RDW: 18.6 % — AB (ref 11.5–15.5)
WBC: 4.7 10*3/uL (ref 4.0–10.5)

## 2015-05-24 LAB — RENAL FUNCTION PANEL
ALBUMIN: 2.7 g/dL — AB (ref 3.5–5.0)
Anion gap: 24 — ABNORMAL HIGH (ref 5–15)
BUN: 114 mg/dL — AB (ref 6–20)
CALCIUM: 9.7 mg/dL (ref 8.9–10.3)
CO2: 17 mmol/L — ABNORMAL LOW (ref 22–32)
CREATININE: 15.53 mg/dL — AB (ref 0.44–1.00)
Chloride: 97 mmol/L — ABNORMAL LOW (ref 101–111)
GFR calc Af Amer: 3 mL/min — ABNORMAL LOW (ref >60)
GFR, EST NON AFRICAN AMERICAN: 3 mL/min — AB (ref >60)
GLUCOSE: 87 mg/dL (ref 65–99)
PHOSPHORUS: 11.6 mg/dL — AB (ref 2.5–4.6)
POTASSIUM: 5.6 mmol/L — AB (ref 3.5–5.1)
SODIUM: 138 mmol/L (ref 135–145)

## 2015-05-24 LAB — GLUCOSE, CAPILLARY
GLUCOSE-CAPILLARY: 73 mg/dL (ref 65–99)
Glucose-Capillary: 51 mg/dL — ABNORMAL LOW (ref 65–99)

## 2015-05-24 MED ORDER — GLUCAGON HCL RDNA (DIAGNOSTIC) 1 MG IJ SOLR
1.0000 mg | Freq: Once | INTRAMUSCULAR | Status: AC | PRN
  Administered 2015-05-24: 1 mg via INTRAVENOUS

## 2015-05-24 MED ORDER — LORAZEPAM 2 MG/ML IJ SOLN
INTRAMUSCULAR | Status: AC
  Administered 2015-05-24 (×2): 2 mg
  Filled 2015-05-24: qty 2

## 2015-05-24 MED ORDER — DIAZEPAM 5 MG/ML IJ SOLN: 10.0000 mg | INTRAMUSCULAR | Status: DC | PRN

## 2015-05-24 MED ORDER — DIAZEPAM 5 MG/ML IJ SOLN
5.0000 mg | INTRAMUSCULAR | Status: DC | PRN
  Administered 2015-05-24 – 2015-05-25 (×2): 5 mg via INTRAMUSCULAR

## 2015-05-24 MED ORDER — LORAZEPAM 2 MG/ML IJ SOLN
INTRAMUSCULAR | Status: AC
  Filled 2015-05-24: qty 1

## 2015-05-24 MED ORDER — GLUCAGON HCL RDNA (DIAGNOSTIC) 1 MG IJ SOLR
INTRAMUSCULAR | Status: AC
  Administered 2015-05-24: 1 mg via INTRAVENOUS
  Filled 2015-05-24: qty 1

## 2015-05-24 MED ORDER — DEXTROSE 5 % IV SOLN
INTRAVENOUS | Status: DC
  Administered 2015-05-25: via INTRAVENOUS

## 2015-05-24 MED ORDER — OLANZAPINE 10 MG IM SOLR: 5.0000 mg | Freq: Once | INTRAMUSCULAR | Status: DC

## 2015-05-24 MED ORDER — OLANZAPINE 10 MG IM SOLR
5.0000 mg | Freq: Once | INTRAMUSCULAR | Status: AC
  Administered 2015-05-24: 5 mg via INTRAMUSCULAR
  Filled 2015-05-24: qty 10

## 2015-05-24 MED ORDER — METOPROLOL TARTRATE 1 MG/ML IV SOLN
5.0000 mg | Freq: Four times a day (QID) | INTRAVENOUS | Status: DC
  Administered 2015-05-25 – 2015-05-29 (×10): 5 mg via INTRAVENOUS
  Filled 2015-05-24 (×25): qty 5

## 2015-05-24 MED ORDER — DIPHENHYDRAMINE HCL 50 MG/ML IJ SOLN
INTRAMUSCULAR | Status: AC
  Filled 2015-05-24: qty 1

## 2015-05-24 MED ORDER — SODIUM CHLORIDE 0.9 % IV SOLN
250.0000 mg | INTRAVENOUS | Status: DC
  Administered 2015-05-24 – 2015-05-29 (×3): 250 mg via INTRAVENOUS
  Filled 2015-05-24 (×9): qty 20

## 2015-05-24 MED ORDER — DEXTROSE 50 % IV SOLN
INTRAVENOUS | Status: AC
  Administered 2015-05-24: 50 mL via INTRAVENOUS
  Filled 2015-05-24: qty 50

## 2015-05-24 MED ORDER — LORAZEPAM 2 MG/ML IJ SOLN
1.0000 mg | INTRAMUSCULAR | Status: DC
Start: 2015-05-24 — End: 2015-05-25
  Administered 2015-05-24 – 2015-05-25 (×5): 4 mg via INTRAVENOUS
  Filled 2015-05-24: qty 1
  Filled 2015-05-24 (×3): qty 2
  Filled 2015-05-24: qty 1
  Filled 2015-05-24: qty 2

## 2015-05-24 MED ORDER — GLUCOSE 40 % PO GEL: 1.0000 | Freq: Once | ORAL | Status: DC

## 2015-05-24 MED ORDER — GLUCOSE 40 % PO GEL
ORAL | Status: AC
  Filled 2015-05-24: qty 1

## 2015-05-24 NOTE — Progress Notes (Signed)
Arrived to patient room 29M-07 at 1400.Marland Kitchen  Reviewed treatment plan and this RN agrees.  Report received from bedside RN, Angela Nevin.  Consent verified.  Patient Drowsy, O X 3- Pt sedated as tolerated with moments of thrashing about her bed. Lung sounds extremely coarse with rhonchi to ausculation in all fields. Generalized moderate edema in BUE and BLE. Cardiac: Tachycardic sustaining in 130's.  Prepped RUAVF with alcohol and cannulated with two 15 gauge needles.  Pulsation of blood noted.  Flushed access well with saline per protocol.  Connected and secured lines and initiated tx at 1412.  UF goal of 4500 mL and net fluid removal of 4000 mL.    Dr. Lorrene Reid notified of potassium level as of 2/15 at 6.8.  Telephone orders received to use 1K bath until current renal function profile results.  Pt noted to be drowsy one moment, thrashing around her bed the next.  This RN accessed patient with help of bedside RN and NT.  Pt in four point restraints.  Ms. Seraphin received 2 mg of ativan through HD access site once arterial access was established.  Pt responded accordingly after medication administration.  Bedside RN offered more ativan to HD RN for patient as needed.    Will continue to monitor.

## 2015-05-24 NOTE — Progress Notes (Signed)
RN went in patient's room upon request for pain medicine and cough medicine. Patient told RN that she had taken her restraints on one side off, sitter at bedside. Patient refused and threatened RN not to touch her. RN left patient with sitter to call security for assistance. RN found patient in the hall way with sitter following her. Security called again. Patient at the nurse's station being verbally abusive. Security arrived on unit to get patient in bed and back in restraints.

## 2015-05-24 NOTE — Progress Notes (Signed)
Dialysis treatment completed.  4500 mL ultrafiltrated and net fluid removal 3500 mL.    Patient Drowsy, arouses to voice. Lung sounds clear, diminished to ausculation in all fields. Generalized BLE and BUE edema. Cardiac: Tachycardic, sustaining in 110's.  Disconnected lines and removed needles.  Pressure held for 10 minutes and band aid/gauze dressing applied.  Report given to bedside RN, Tvedt.

## 2015-05-24 NOTE — Progress Notes (Signed)
1635-Tx pauseed due to clotted venous chamber.  While returning blood patient dislodged venous needle.  This RN applied pressure.  Georgina Snell, RN (HD charge) at bedside and assisted in holding pressure.  Patient became agitated at this time, intermittently crying and squirming in restraints.  Georgina Snell, RN took over holding pressure at venous site, then re accessed fistula.  This RN stripped machine and relined.  Tx resumed at 1645, with new end time of 1833 (previously 1813.  Tx resumed and ativan given to patient for agitation.  Will continue to monitor.

## 2015-05-24 NOTE — Progress Notes (Signed)
[facility] Progress Note Patient Name: Sara [name] DOB: 15-Jan-1981 MRN: [id number]   Date of Service  05/24/2015  HPI/Events of Note  Hypoglycemia - Blood glucose = 51. Patient NPO.  eICU Interventions  Will order D5W IV infusion at 50 mL/hour.      Intervention Category Major Interventions: Other:  Lysle Dingwall 05/24/2015, 11:59 PM

## 2015-05-24 NOTE — Progress Notes (Signed)
Patient refused vital signs and  labs this morning this morning. Patient is till verbally abusive to staff.

## 2015-05-24 NOTE — Progress Notes (Signed)
Dr. Lorrene Reid notified of potassium at 5.6.  Telephone order received to run 1K for two hours, then switch to 2K.  Will continue to monitor.
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
## 2015-05-24 NOTE — Progress Notes (Signed)
Report given to [place] on [place].  All questions answered.

## 2015-05-24 NOTE — Progress Notes (Signed)
05/24/2015 1:29 PM  Patient mother returned this RN phone call. Updated the mother about the transfer to 2M07. Mother verbalized understanding and appreciation for the follow up.   [employer], RN-BC, [facility] [employer] [address] Phone [phone]
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
## 2015-05-24 NOTE — Progress Notes (Signed)
UR Completed. Lashana Spang, RN, BSN.  [phone] 

## 2015-05-24 NOTE — Progress Notes (Signed)
Report given to Rodena Piety, RN in [facility].

## 2015-05-24 NOTE — Progress Notes (Signed)
eLink Physician-Brief Progress Note Patient Name: Sara [name] DOB: 10/27/1980 MRN: [id number]   Date of Service  05/24/2015  HPI/Events of Note  Hypoglycemia No IV access Given glucagon   eICU Interventions  Oral glucose solution now OG tube for orange juice IV team to place peripheral IV May need CVL     Intervention Category Intermediate Interventions: Other: (Hypoglycemia)  Simonne Maffucci 05/24/2015, 9:31 PM

## 2015-05-24 NOTE — Care Management Obs Status (Addendum)
Late Entry, given to pt on 05/23/2015.   [facility] NOTIFICATION   Patient Details  Name: Sara [name] MRN: [id number] Date of Birth: 05-Jan-1981   Medicare Observation Status Notification Given:  Yes Late Entry, given to pt on 05/23/2015   Adron Bene, RN 05/24/2015, 12:08 PM

## 2015-05-24 NOTE — Progress Notes (Signed)
[facility] Progress Note  Assessment/Plan: 1. ESRD - has no dialysis home. Has been dismissed from unit in [place] due to behavioral issues. Gets her dialysis on a prn basis, coming through the ER each time (her continued aggressive and inappropriate behaviors have been a major impediment to her receipt of care). She will not be able to dialyze anywhere in [place]. 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 She is agreeable to this; I have called in consult. She insists that she needs HD today (Thursday) and that is going home after HD.  Plan HD again today to continue to titrate volume. Signs off early making dialysis less effective 2. Hyperkalemia K 6.8 yesterday - had abbreviated HD - HD again today- check labs pre HD 3. Bipolar/schizophrenia - MAJOR behavioral issues. Psych has seen. Seroquel tid started- I really don't think she will take this on her own after discharge.  Received one dose of valium yesterday - prn valcium started today and also about to receive a one time dose of Zyprexa due to severe agitation that worsened after I left her room.  Being transferred to a higher level of car. (She really needs to be institutionalized so that her disease can be controlled) 4. Anemia - Hb 7.1. Last Fe studies Nov 2016. 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 Phos 11-13. Will take advantage of current admission to check these things and get her on some binders if she will take them. Start Renvela with meals 2 TID ac- not on VDRA- check iPTH which is likely to be quite elevated 6. Nutrition -  on multivit/renavite- not sure she needs thiamin  7. HTN/ volume - BP poorly controlled - net UF 2/15 was 1839 with post wt 67.2 - continue to titrate volume down 8. DM - per primary; no BS  Myriam Jacobson, PA-C [place] [phone] 05/24/2015,10:50 AM     I have seen and  examined this patient and agree with plan and assessment in the above note. Patient's behavior subsequent to this assessment became extremely volatile. Discussions held with CCM about the merits of intubation/precedex. CCM favored heavy sedation. Psyche according to CCM wants 3 complete dialysis sessions prior to consideration of "transfer or discharge".  We are unable to provide further dialysis unless moved to a higher level of care with heavy sedation as she has proved to be dangerous in the dialysis unit setting to both herself and to staff.  She has now transferred to ICU and is currently undergoing dialysis in restraints. She has received ativan, zyprexa, valium over the course of the day.  Johnrobert Foti B,MD 05/24/2015 6:06 PM   Subjective:   Multiple questions. Wants HD today. Aggreable to have VVS evaluate HD. Very insistent that she is going home today after HD  Objective Filed Vitals:   05/23/15 0843 05/23/15 0854 05/23/15 0902 05/23/15 1912  BP: 161/98 168/116  171/104  Pulse: 107 115  105  Temp:      TempSrc:      Resp:      Weight:   67.2 kg (148 lb 2.4 oz)   SpO2:    100%   Physical Exam General: NAD - pt was calm when I saw her starting to eat breakfast, face less edematous Heart: tachy reg Lungs: no rales Abdomen: soft Extremities: 1+ LE edema; soft restraints in  place Dialysis Access:  Right Upper AVF very aneurysmal - patent  Dialysis Orders: no center  Additional Objective Labs: Basic Metabolic Panel:  Recent Labs Lab 05/17/15 2146 05/19/15 0853 05/22/15 1846 05/23/15 0715  NA 138 136 133* 135  K 5.9* 5.6* 6.5* 6.8*  CL 96* 99* 101 96*  CO2 15* 17*  --  15*  GLUCOSE 76 112* 102* 100*  BUN 135* 117* 138* 158*  CREATININE 17.47* 15.15* 17.10* 18.42*  CALCIUM 9.9 9.1  --  9.3  PHOS 12.3* 11.6*  --  13.8*   Liver Function Tests:  Recent Labs Lab 05/17/15 2146 05/19/15 0853 05/23/15 0715  ALBUMIN 2.8* 2.7* 2.7*   CBC:  Recent Labs Lab  05/17/15 2147 05/19/15 0854 05/22/15 1846 05/22/15 1849 05/23/15 0714  WBC 6.3 5.2  --  5.0 6.3  NEUTROABS  --   --   --  3.0  --   HGB 8.4* 8.2* 8.5* 7.9* 7.1*  HCT 25.7* 24.6* 25.0* 24.0* 21.8*  MCV 82.9 83.1  --  82.2 80.1  PLT 225 232  --  232 250  Iron Studies:  Recent Labs  05/23/15 0817  IRON 51  TIBC 252   Medications:   . darbepoetin (ARANESP) injection - DIALYSIS  200 mcg Intravenous Q Wed-HD  . dextrose  1 ampule Intravenous Once  . diazepam  5 mg Intravenous Once  . folic acid  1 mg Oral Daily  . heparin  5,000 Units Subcutaneous 3 times per day  . insulin aspart  0-9 Units Subcutaneous 6 times per day  . insulin aspart  10 Units Intravenous Once  . multivitamin with minerals  1 tablet Oral Daily  . OLANZapine  5 mg Intramuscular Once  . QUEtiapine  50 mg Oral TID  . sevelamer carbonate  1,600 mg Oral TID WC  . sodium chloride flush  3 mL Intravenous Q12H  . thiamine  100 mg Oral Daily   Or  . thiamine  100 mg Intravenous Daily  . triamcinolone ointment   Topical BID
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
## 2015-05-24 NOTE — Progress Notes (Signed)
05/24/2015 11:34 AM  Patient back to room, was unable to transfer to [place] at this time. Alerted AC. Informed patient mother who was unable to answer the phone at this time. Left a message. Patient continues to act out, verbally and physically abusive to staff members. Leadership at bedside. AC at bedside. MD at bedside. Security at bedside. Will continue to assess and monitor the patient.   [employer], RN-BC, [employer] [facility] Phone [phone]

## 2015-05-24 NOTE — Consult Note (Signed)
PULMONARY / CRITICAL CARE MEDICINE   Name: Sara [name] MRN: [id number] DOB: 08/09/1980    ADMISSION DATE:  05/22/2015 CONSULTATION DATE:  05/24/2015  REFERRING MD:  [name]  CHIEF COMPLAINT:  Combative and refusing dialysis.  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  Involuntarily committed by her mother and psych.  Now very combative and refusing dialysis.  PCCM was called since patient is refusing everything but has to have dialysis.    PAST MEDICAL HISTORY :  She  has a past medical history of Lupus ([employer]); History of blood transfusion; ESRD (end stage renal disease) on dialysis ([place]); Bipolar disorder ([place]); Schizophrenia ([place]); Chronic anemia; CHF (congestive heart failure) ([place]); Acute myopericarditis; Psychosis; Hypertension; Pregnancy; and Low back pain.  PAST SURGICAL HISTORY: She  has past surgical history that includes AV fistula placement (Right, 03/2013); AV fistula repair (Right, 2015); head surgery (2005); and AV fistula placement (Right, 03/10/2013).  Allergies  Allergen Reactions  . Ativan [Lorazepam] Other (See Comments)    Dysarthria(patient has difficulty speaking and slurred speech); denies swelling, itching, pain, or numbness.  [name] [Ziprasidone Hcl] Itching and Swelling    Tongue swelling  . Keflex [Cephalexin] Swelling and Other (See Comments)    Tongue swelling. Can't talk   . Haldol [Haloperidol Lactate] Swelling    Tongue swelling  . Other Itching    wool    No current facility-administered medications on file prior to encounter.   Current Outpatient Prescriptions on File Prior to Encounter  Medication Sig  . metoprolol (LOPRESSOR) 50 MG tablet Take 1 tablet (50 mg total) by mouth 2 (two) times daily.  . Naproxen Sodium (ALEVE PO) Take 1 tablet by mouth daily as needed. For lower  back pain per patient  . traMADol (ULTRAM) 50 MG tablet Take 1 tablet (50 mg total) by mouth every 8 (eight) hours as needed for moderate pain or severe pain.  Marland Kitchen triamcinolone (KENALOG) 0.025 % ointment APPLY 1 APPLICATION TOPICALLY 2 (TWO) TIMES DAILY.    FAMILY HISTORY:  Her indicated that her mother is alive. She indicated that her father is alive. She indicated that her sister is alive. She indicated that her brother is alive.   SOCIAL HISTORY: She  reports that she has been smoking Cigarettes.  She has a 2 pack-year smoking history. She uses smokeless tobacco. She reports that she drinks about 4.2 oz of alcohol per week. She reports that she uses illicit drugs (Marijuana).  REVIEW OF SYSTEMS:   Unattainable, patient is very combative, security in the room.  SUBJECTIVE:  I feel fine I want to sign out AMA.  VITAL SIGNS: BP 171/104 mmHg  Pulse 105  Temp(Src) 97.6 F (36.4 C) (Oral)  Resp 19  Wt 67.2 kg (148 lb 2.4 oz)  SpO2 100%  HEMODYNAMICS:    VENTILATOR SETTINGS:    INTAKE / OUTPUT: I/O last 3 completed shifts: In: 1080 [P.O.:1080] Out: 1839 [Other:1839]  PHYSICAL EXAMINATION: General:  Chronically ill appearing female, crying and screaming, 4 point restraint in bed. Neuro:  Awake and interactive, moving all ext combative. HEENT:  [place]/AT, PERRL, EOM-I and MMM. Cardiovascular:  RRR, Nl S1/S2, -M/R/G. Lungs:  CTA bilaterally. Abdomen:  Diffuse pain.  ND and +BS. Musculoskeletal:  -edema and -tenderness.  Right sided fistula. Skin:  Intact.  LABS:  BMET  Recent Labs  Lab 05/17/15 2146 05/19/15 0853 05/22/15 1846 05/23/15 0715  NA 138 136 133* 135  K 5.9* 5.6* 6.5* 6.8*  CL 96* 99* 101 96*  CO2 15* 17*  --  15*  BUN 135* 117* 138* 158*  CREATININE 17.47* 15.15* 17.10* 18.42*  GLUCOSE 76 112* 102* 100*    Electrolytes  Recent Labs Lab 05/17/15 2146 05/19/15 0853 05/23/15 0715  CALCIUM 9.9 9.1 9.3  PHOS 12.3* 11.6* 13.8*    CBC  Recent  Labs Lab 05/19/15 0854 05/22/15 1846 05/22/15 1849 05/23/15 0714  WBC 5.2  --  5.0 6.3  HGB 8.2* 8.5* 7.9* 7.1*  HCT 24.6* 25.0* 24.0* 21.8*  PLT 232  --  232 250    Coag's No results for input(s): APTT, INR in the last 168 hours.  Sepsis Markers No results for input(s): LATICACIDVEN, PROCALCITON, O2SATVEN in the last 168 hours.  ABG No results for input(s): PHART, PCO2ART, PO2ART in the last 168 hours.  Liver Enzymes  Recent Labs Lab 05/17/15 2146 05/19/15 0853 05/23/15 0715  ALBUMIN 2.8* 2.7* 2.7*    Cardiac Enzymes No results for input(s): TROPONINI, PROBNP in the last 168 hours.  Glucose No results for input(s): GLUCAP in the last 168 hours.  Imaging No results found.   STUDIES:  None  CULTURES: None  ANTIBIOTICS: None  SIGNIFICANT EVENTS: 2/16 transfer to the ICU for sedation for dialysis.  LINES/TUBES: Right sided fistula.  DISCUSSION: 35 year old female multiple psych disorders, combative, committed and refusing dialysis.  ASSESSMENT / PLAN:  PULMONARY A: No active issues. P:   O2 as needed.  CARDIOVASCULAR A:  Sinus tach - agitated. P:  Calm patient down. EKG for QT interval.  RENAL A:   ESRD-HD P:   HD today after patient is calm.  GASTROINTESTINAL A:   Hungry P:   Renal diet post dialysis.  HEMATOLOGIC A:   Did not allow blood draws. P:  CBC post dialysis.  INFECTIOUS A:   No active infection. P:   Monitor.  ENDOCRINE A:   No active issues.   P:   Monitor.  NEUROLOGIC A:   Schizophrenia P:   Obtain IV access. Haldol Check EKG Ativan IV Psych medications per pscyh MD.  FAMILY  - Updates: No family bedside, patient is involuntarily committed.  Discussed with psych MD, will keep in ICU, dialysis today and likely transfer out of the ICU in AM.  Committed for 14 days.  Psych would like 3 complete dialysis sessions prior to consideration of transfer to psych facility or discharge.  The patient is  critically ill with multiple organ systems failure and requires high complexity decision making for assessment and support, frequent evaluation and titration of therapies, application of advanced monitoring technologies and extensive interpretation of multiple databases.   Critical Care Time devoted to patient care services described in this note is  90  Minutes. This time reflects time of care of this signee Dr Jennet Maduro. This critical care time does not reflect procedure time, or teaching time or supervisory time of PA/NP/Med student/Med Resident etc but could involve care discussion time.  Rush Farmer, M.D. [facility] Pulmonary/Critical Care Medicine. Pager: [phone]. After hours pager: [phone].  05/24/2015, 1:36 PM

## 2015-05-24 NOTE — Progress Notes (Addendum)
Psych CSW made aware of Patient transfer to [facility] and CSW consult regarding Patient being IVC'ed.  Patient has also been verbally and physically abusive to staff members during hospitalization. Patient is being followed by Dr. Louretta Shorten, Psychiatrist to determine disposition. As of 05/23/2015 at 10:27AM per Dr. Louretta Shorten- psychiatric disposition: To be determined when patient becomes more stable to contribute for the appropriate psychiatric evaluation without combative behaviors. CSW will continue to follow.   Holly Bodily, LCSW [facility] ED/ [place] Social Worker [phone]

## 2015-05-24 NOTE — Progress Notes (Signed)
Patient is in restraints and is still verbally abusive to staff, she has refused assessment and medication. Patient complains of dirty linens, however, patient is refusing change of linens stating that she does not want to be touched by anybody and wants to change her linens herself.
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
## 2015-05-25 DIAGNOSIS — T82898A Other specified complication of vascular prosthetic devices, implants and grafts, initial encounter: Secondary | ICD-10-CM

## 2015-05-25 DIAGNOSIS — J9811 Atelectasis: Secondary | ICD-10-CM | POA: Insufficient documentation

## 2015-05-25 DIAGNOSIS — E875 Hyperkalemia: Principal | ICD-10-CM

## 2015-05-25 DIAGNOSIS — T829XXA Unspecified complication of cardiac and vascular prosthetic device, implant and graft, initial encounter: Secondary | ICD-10-CM | POA: Insufficient documentation

## 2015-05-25 DIAGNOSIS — Z046 Encounter for general psychiatric examination, requested by authority: Secondary | ICD-10-CM

## 2015-05-25 LAB — GLUCOSE, CAPILLARY
GLUCOSE-CAPILLARY: 125 mg/dL — AB (ref 65–99)
GLUCOSE-CAPILLARY: 129 mg/dL — AB (ref 65–99)
GLUCOSE-CAPILLARY: 47 mg/dL — AB (ref 65–99)
GLUCOSE-CAPILLARY: 69 mg/dL (ref 65–99)
Glucose-Capillary: 55 mg/dL — ABNORMAL LOW (ref 65–99)
Glucose-Capillary: 129 mg/dL — ABNORMAL HIGH (ref 65–99)
Glucose-Capillary: 60 mg/dL — ABNORMAL LOW (ref 65–99)
Glucose-Capillary: 54 mg/dL — ABNORMAL LOW (ref 65–99)
Glucose-Capillary: 63 mg/dL — ABNORMAL LOW (ref 65–99)
Glucose-Capillary: 85 mg/dL (ref 65–99)
Glucose-Capillary: 124 mg/dL — ABNORMAL HIGH (ref 65–99)
Glucose-Capillary: 108 mg/dL — ABNORMAL HIGH (ref 65–99)
Glucose-Capillary: 67 mg/dL (ref 65–99)
Glucose-Capillary: 144 mg/dL — ABNORMAL HIGH (ref 65–99)

## 2015-05-25 LAB — BASIC METABOLIC PANEL
Anion gap: 17 — ABNORMAL HIGH (ref 5–15)
BUN: 39 mg/dL — AB (ref 6–20)
CALCIUM: 9.8 mg/dL (ref 8.9–10.3)
CO2: 26 mmol/L (ref 22–32)
CREATININE: 7.54 mg/dL — AB (ref 0.44–1.00)
Chloride: 94 mmol/L — ABNORMAL LOW (ref 101–111)
GFR calc Af Amer: 7 mL/min — ABNORMAL LOW (ref >60)
GFR, EST NON AFRICAN AMERICAN: 6 mL/min — AB (ref >60)
Glucose, Bld: 76 mg/dL (ref 65–99)
Potassium: 4.3 mmol/L (ref 3.5–5.1)
SODIUM: 137 mmol/L (ref 135–145)

## 2015-05-25 LAB — CBC
HCT: 27.8 % — ABNORMAL LOW (ref 36.0–46.0)
Hemoglobin: 8.9 g/dL — ABNORMAL LOW (ref 12.0–15.0)
MCH: 27 pg (ref 26.0–34.0)
MCHC: 32 g/dL (ref 30.0–36.0)
MCV: 84.2 fL (ref 78.0–100.0)
PLATELETS: 233 10*3/uL (ref 150–400)
RBC: 3.3 MIL/uL — ABNORMAL LOW (ref 3.87–5.11)
RDW: 18.8 % — ABNORMAL HIGH (ref 11.5–15.5)
WBC: 5 10*3/uL (ref 4.0–10.5)

## 2015-05-25 LAB — MAGNESIUM: MAGNESIUM: 2.2 mg/dL (ref 1.7–2.4)

## 2015-05-25 LAB — PHOSPHORUS: Phosphorus: 7.9 mg/dL — ABNORMAL HIGH (ref 2.5–4.6)

## 2015-05-25 MED ORDER — DEXTROSE 50 % IV SOLN
1.0000 | Freq: Once | INTRAVENOUS | Status: AC
  Administered 2015-05-25: 50 mL via INTRAVENOUS
  Filled 2015-05-25: qty 50

## 2015-05-25 MED ORDER — DEXTROSE 50 % IV SOLN
INTRAVENOUS | Status: AC
Start: 2015-05-25 — End: 2015-05-25
  Filled 2015-05-25: qty 50

## 2015-05-25 MED ORDER — DEXTROSE 50 % IV SOLN
INTRAVENOUS | Status: AC
  Administered 2015-05-25: 50 mL
  Filled 2015-05-25: qty 50

## 2015-05-25 MED ORDER — DEXTROSE 50 % IV SOLN
INTRAVENOUS | Status: AC
  Filled 2015-05-25: qty 50

## 2015-05-25 MED ORDER — DARBEPOETIN ALFA 200 MCG/0.4ML IJ SOSY
200.0000 ug | PREFILLED_SYRINGE | INTRAMUSCULAR | Status: DC
  Filled 2015-05-25 (×2): qty 0.4

## 2015-05-25 MED ORDER — DEXTROSE 50 % IV SOLN
50.0000 mL | Freq: Once | INTRAVENOUS | Status: AC
  Administered 2015-05-25: 50 mL via INTRAVENOUS

## 2015-05-25 MED ORDER — DEXTROSE 50 % IV SOLN
1.0000 | Freq: Once | INTRAVENOUS | Status: AC
  Administered 2015-05-25: 50 mL via INTRAVENOUS

## 2015-05-25 MED ORDER — DEXTROSE 50 % IV SOLN
25.0000 mL | Freq: Once | INTRAVENOUS | Status: AC
  Administered 2015-05-25: 25 mL via INTRAVENOUS
  Filled 2015-05-25: qty 50

## 2015-05-25 MED ORDER — DEXTROSE 10 % IV SOLN
INTRAVENOUS | Status: DC
  Administered 2015-05-25 – 2015-05-29 (×4): via INTRAVENOUS

## 2015-05-25 MED ORDER — DEXMEDETOMIDINE HCL IN NACL 200 MCG/50ML IV SOLN
0.2000 ug/kg/h | INTRAVENOUS | Status: DC
  Administered 2015-05-25: 0.2 ug/kg/h via INTRAVENOUS
  Administered 2015-05-26: 0.4 ug/kg/h via INTRAVENOUS
  Filled 2015-05-25: qty 100

## 2015-05-25 NOTE — Progress Notes (Signed)
[facility] Progress Note Patient Name: Sara [name] DOB: [birth year] MRN: [id number]   Date of Service  05/25/2015  HPI/Events of Note  Call from bedside nurse that patient remains combative, abusive to staff, striking out at staff despite scheduled and prn psychotropic and anxiolytic meds.  Had been transferred to ICU for precedex but no IV access.  Now with IV access.  Is allergic to Haldol.  Is tachy but HD stable.  eICU Interventions  Plan: Start precedex IV Continue to monitor patient.     Intervention Category Major Interventions: Delirium, psychosis, severe agitation - evaluation and management  DETERDING,ELIZABETH 05/25/2015, 11:06 PM

## 2015-05-25 NOTE — Progress Notes (Signed)
Pt not as arrousable as this morning. MD brought to bedside. Orders given to hold 1400 Ativan as pt is too sedated. Will continue to closely monitor.

## 2015-05-25 NOTE — Progress Notes (Signed)
Inpatient Diabetes Program Recommendations  AACE/ADA: New Consensus Statement on Inpatient Glycemic Control (2015)  Target Ranges:  Prepandial:   less than 140 mg/dL      Peak postprandial:   less than 180 mg/dL (1-2 hours)      Critically ill patients:  140 - 180 mg/dL   Review of Glycemic Control  Results for LAILANI, GARLING (MRN [id number]) as of 05/25/2015 09:40  Ref. Range 05/24/2015 23:45 05/25/2015 03:49 05/25/2015 04:39 05/25/2015 08:02 05/25/2015 08:45  Glucose-Capillary Latest Ref Range: 65-99 mg/dL 69 60 (L) 125 (H) 54 (L) 129 (H)    Outpatient Diabetes medications: none Current orders for Inpatient glycemic control: Novolog 0-9 units q4h  Inpatient Diabetes Program Recommendations:  No recent insulin needed and recent low blood sugar- MD ordered D5W IV infusion at 50 mL/hour. Consider changing Novolog correction insulin to q6 h  Gentry Fitz, RN, [place], [place], CDE Diabetes Coordinator Inpatient Diabetes Program  [phone] (Team Pager) [phone] ([place]) 05/25/2015 9:47 AM

## 2015-05-25 NOTE — Progress Notes (Signed)
Patient refusing oxygen therapy, biting canula and biting/verbally abusive to staff. Nasal canula removed and will monitor oxygenation.

## 2015-05-25 NOTE — Progress Notes (Signed)
eLink Physician-Brief Progress Note Patient Name: Sara [name] DOB: 04-10-1980 MRN: [id number]   Date of Service  05/25/2015  HPI/Events of Note  Continued hypoglycemia in the setting of ESRD on D10 infusion at 40 cc/hr.  Blood sugar in the 60s requiring D50 bolus.  eICU Interventions  Plan: Increase D10 to 50 cc/hr Continue to monitor blood sugars     Intervention Category Intermediate Interventions: Other:  DETERDING,ELIZABETH 05/25/2015, 10:13 PM

## 2015-05-25 NOTE — Progress Notes (Signed)
[name] Progress Note    Subjective:    Sedated and snoring Able to complete a full treatment yesterday -  3.5 liters off Did have an issue with a clotted venous chamber and when returning blood she dislodged a needle, became very agitated, but able to reaccess the AVF and complete the treatment She will required 1 on 1 nursing to get through her HD treatments if remains as she has been  Issues with hypoglycemia  Has had IV benadry, IV ativan, IV valium, hydrocodone, 2 doses of IM zyprexa  Objective Filed Vitals:   05/25/15 0700 05/25/15 0800 05/25/15 0812 05/25/15 0900  BP: 149/105 140/103  155/109  Pulse: 100   103  Temp:   97.8 F (36.6 C)   TempSrc:   Oral   Resp: 28 34  32  Weight:      SpO2: 87%   98%   Physical Exam General: Snoring, sedated Heart: tachy reg Lungs: no rales Abdomen: soft Extremities:No edema Dialysis Access:  Right Upper AVF very aneurysmal - patent  Dialysis Orders: no center  Additional Objective Labs: Basic Metabolic Panel:  Recent Labs Lab 05/23/15 0715 05/24/15 1400 05/25/15 0300  NA 135 138 137  K 6.8* 5.6* 4.3  CL 96* 97* 94*  CO2 15* 17* 26  GLUCOSE 100* 87 76  BUN 158* 114* 39*  CREATININE 18.42* 15.53* 7.54*  CALCIUM 9.3 9.7 9.8  PHOS 13.8* 11.6* 7.9*   Liver Function Tests:  Recent Labs Lab 05/19/15 0853 05/23/15 0715 05/24/15 1400  ALBUMIN 2.7* 2.7* 2.7*   CBC:  Recent Labs Lab 05/19/15 0854  05/22/15 1849 05/23/15 0714 05/24/15 1400 05/25/15 0300  WBC 5.2  --  5.0 6.3 4.7 5.0  NEUTROABS  --   --  3.0  --   --   --   HGB 8.2*  < > 7.9* 7.1* 7.5* 8.9*  HCT 24.6*  < > 24.0* 21.8* 23.4* 27.8*  MCV 83.1  --  82.2 80.1 81.0 84.2  PLT 232  --  232 250 196 233  < > = values in this interval not displayed.Iron Studies:   Recent Labs  05/23/15 0817  IRON 51  TIBC 252   Medications: . dextrose 50 mL/hr at 05/25/15 0007   . darbepoetin (ARANESP) injection - DIALYSIS  200 mcg  Intravenous Q Wed-HD  . dextrose      . diazepam  5 mg Intravenous Once  . ferric gluconate (FERRLECIT/NULECIT) IV  250 mg Intravenous Q T,Th,Sa-HD  . folic acid  1 mg Oral Daily  . heparin  5,000 Units Subcutaneous 3 times per day  . insulin aspart  0-9 Units Subcutaneous 6 times per day  . insulin aspart  10 Units Intravenous Once  . LORazepam  1-4 mg Intravenous Q4H  . metoprolol  5 mg Intravenous 4 times per day  . multivitamin with minerals  1 tablet Oral Daily  . QUEtiapine  50 mg Oral TID  . sevelamer carbonate  1,600 mg Oral TID WC  . sodium chloride flush  3 mL Intravenous Q12H  . thiamine  100 mg Oral Daily   Or  . thiamine  100 mg Intravenous Daily  . triamcinolone ointment   Topical BID    Assessment/Plan: 1. ESRD - has no dialysis home. Has been dismissed from unit in [place] due to behavioral issues. Gets her dialysis on a prn basis, coming through the ER each time (her continued aggressive and inappropriate behaviors have been a major  impediment to her receipt of care). She will not be able to dialyze anywhere in [place].AVF evaluated by VVS who felt no interventino needed. Labs and volume look fine today. Anticipate dialysis tomorrow then try to keep on a TTS schedule. Got a good treatment yesterday.  Required IM zyprexa, valium, ativan. She will require 1 on 1 nursing in order to receive HD - cannot do this for now in the 6th floor HD unit  2. Hyperkalemia K 6.8 yesterday - resolved with a good treatment  3. Bipolar/schizophrenia - MAJOR behavioral issues. Psych has seen. Seroquel tid ordered but she has had no doses in the hospital either from sedation or because she refused and it is highly unlikely that she will ever take - here or at home. I suspect she would require some sort of parenteral depot medication to ever assure that she receives . (She really needs to be institutionalized so that her disease can be controlled)  4. Anemia - Started weekly Aranesp 200 q Wed.   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 Phos 11-13. Will take advantage of current admission to check these things and get her on some binders if she will take them. Start Renvela with meals 2 TID ac- not on VDRA- check iPTH which is likely to be quite elevated (was 818 in 09/2014   6. Nutrition -  on multivit/renavite  7. HTN/ volume - BP poorly controlled - net UF 2/15 was 1839 with post wt 67.2 - continue to titrate volume down  8. DM - per primary; no BS  Jamal Maes, MD [facility] [phone] Pager 05/25/2015, 10:01 AM

## 2015-05-25 NOTE — Consult Note (Signed)
PULMONARY / CRITICAL CARE MEDICINE   Name: Sara [name] MRN: [id number] DOB: 1980-11-14    ADMISSION DATE:  05/22/2015 CONSULTATION DATE:  05/24/2015  REFERRING MD:  [name]  CHIEF COMPLAINT:  Combative and refusing dialysis.  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  Involuntarily committed by her mother and psych.  Now very combative and refusing dialysis.  PCCM was called since patient is refusing everything but has to have dialysis.    SUBJECTIVE:  HD done thur, tolerated ativan, no resp failure  VITAL SIGNS: BP 155/109 mmHg  Pulse 103  Temp(Src) 97.8 F (36.6 C) (Oral)  Resp 32  Wt 64.7 kg (142 lb 10.2 oz)  SpO2 98%  HEMODYNAMICS:    VENTILATOR SETTINGS:    INTAKE / OUTPUT: I/O last 3 completed shifts: In: 1184.2 [P.O.:840; I.V.:344.2] Out: 3500 [Other:3500]  PHYSICAL EXAMINATION: General:  Chronically ill appearing female, 4 point restraint in bed. Neuro:  rass -1, will talk HEENT:  [place]/AT, PERRL Cardiovascular:  RRR, Nl S1/S2, -M/R/G. Lungs: ronchi Abdomen:  Diffuse pain.  ND and +BS. Musculoskeletal:  -edema and -tenderness.  Right sided fistula. Skin:  Intact.  LABS:  BMET  Recent Labs Lab 05/23/15 0715 05/24/15 1400 05/25/15 0300  NA 135 138 137  K 6.8* 5.6* 4.3  CL 96* 97* 94*  CO2 15* 17* 26  BUN 158* 114* 39*  CREATININE 18.42* 15.53* 7.54*  GLUCOSE 100* 87 76    Electrolytes  Recent Labs Lab 05/23/15 0715 05/24/15 1400 05/25/15 0300  CALCIUM 9.3 9.7 9.8  MG  --   --  2.2  PHOS 13.8* 11.6* 7.9*    CBC  Recent Labs Lab 05/23/15 0714 05/24/15 1400 05/25/15 0300  WBC 6.3 4.7 5.0  HGB 7.1* 7.5* 8.9*  HCT 21.8* 23.4* 27.8*  PLT 250 196 233    Coag's No results for input(s): APTT, INR in the last 168 hours.  Sepsis Markers No results for input(s):  LATICACIDVEN, PROCALCITON, O2SATVEN in the last 168 hours.  ABG No results for input(s): PHART, PCO2ART, PO2ART in the last 168 hours.  Liver Enzymes  Recent Labs Lab 05/19/15 0853 05/23/15 0715 05/24/15 1400  ALBUMIN 2.7* 2.7* 2.7*    Cardiac Enzymes No results for input(s): TROPONINI, PROBNP in the last 168 hours.  Glucose  Recent Labs Lab 05/24/15 2154 05/24/15 2345 05/25/15 0349 05/25/15 0439 05/25/15 0802 05/25/15 0845  GLUCAP 129* 69 60* 125* 54* 129*    Imaging No results found.   STUDIES:  None  CULTURES: None  ANTIBIOTICS: None  SIGNIFICANT EVENTS: 2/16 transfer to the ICU for sedation for dialysis., safely got ativan   LINES/TUBES: Right sided fistula.  DISCUSSION: 35 year old female multiple psych disorders, combative, committed and refusing dialysis.  ASSESSMENT / PLAN:  PULMONARY A: Protecting airway thus far P:   O2 as needed Keep in icu , hd again in am  pcxr in am for atx  CARDIOVASCULAR A:  Sinus tach - agitated. P:  Tele Consider co2 detector, post tidal  RENAL A:   ESRD-HD P:   HD in am   GASTROINTESTINAL A:   Hungry P:   Renal diet post dialysis if able, unlikely  HEMATOLOGIC A:   Did not allow blood draws. P:  CBC post dialysis.  INFECTIOUS A:   No active infection. P:  Monitor.  ENDOCRINE A:   No active issues.   P:   Monitor.  NEUROLOGIC A:   Schizophrenia P:   Haldol allergy, not giving Ativan IV Psych medications per pscyh MD  Lavon Paganini. Titus [name], MD, [place] Pgr: [facility]

## 2015-05-26 ENCOUNTER — Inpatient Hospital Stay (HOSPITAL_COMMUNITY): Payer: Medicare Other

## 2015-05-26 DIAGNOSIS — F209 Schizophrenia, unspecified: Secondary | ICD-10-CM

## 2015-05-26 DIAGNOSIS — F29 Unspecified psychosis not due to a substance or known physiological condition: Secondary | ICD-10-CM

## 2015-05-26 LAB — RENAL FUNCTION PANEL
ANION GAP: 16 — AB (ref 5–15)
Albumin: 2 g/dL — ABNORMAL LOW (ref 3.5–5.0)
BUN: 47 mg/dL — ABNORMAL HIGH (ref 6–20)
CHLORIDE: 89 mmol/L — AB (ref 101–111)
CO2: 24 mmol/L (ref 22–32)
Calcium: 8.4 mg/dL — ABNORMAL LOW (ref 8.9–10.3)
Creatinine, Ser: 10.25 mg/dL — ABNORMAL HIGH (ref 0.44–1.00)
GFR, EST AFRICAN AMERICAN: 5 mL/min — AB (ref >60)
GFR, EST NON AFRICAN AMERICAN: 4 mL/min — AB (ref >60)
Glucose, Bld: 88 mg/dL (ref 65–99)
Phosphorus: 9.7 mg/dL — ABNORMAL HIGH (ref 2.5–4.6)
Potassium: 4.7 mmol/L (ref 3.5–5.1)
Sodium: 129 mmol/L — ABNORMAL LOW (ref 135–145)

## 2015-05-26 LAB — CBC
HEMATOCRIT: 25 % — AB (ref 36.0–46.0)
HEMOGLOBIN: 8 g/dL — AB (ref 12.0–15.0)
MCH: 26.8 pg (ref 26.0–34.0)
MCHC: 32 g/dL (ref 30.0–36.0)
MCV: 83.6 fL (ref 78.0–100.0)
Platelets: 227 10*3/uL (ref 150–400)
RBC: 2.99 MIL/uL — AB (ref 3.87–5.11)
RDW: 18.5 % — ABNORMAL HIGH (ref 11.5–15.5)
WBC: 4.1 10*3/uL (ref 4.0–10.5)

## 2015-05-26 LAB — GLUCOSE, CAPILLARY
GLUCOSE-CAPILLARY: 84 mg/dL (ref 65–99)
GLUCOSE-CAPILLARY: 89 mg/dL (ref 65–99)
GLUCOSE-CAPILLARY: 97 mg/dL (ref 65–99)
Glucose-Capillary: 99 mg/dL (ref 65–99)
Glucose-Capillary: 113 mg/dL — ABNORMAL HIGH (ref 65–99)

## 2015-05-26 MED ORDER — LORAZEPAM 2 MG/ML IJ SOLN
1.0000 mg | Freq: Once | INTRAMUSCULAR | Status: AC
  Administered 2015-05-26: 1 mg via INTRAVENOUS
  Filled 2015-05-26: qty 1

## 2015-05-26 MED ORDER — OLANZAPINE 10 MG IM SOLR
5.0000 mg | Freq: Three times a day (TID) | INTRAMUSCULAR | Status: DC | PRN
  Filled 2015-05-26 (×2): qty 10

## 2015-05-26 MED ORDER — DEXMEDETOMIDINE HCL IN NACL 400 MCG/100ML IV SOLN
0.2000 ug/kg/h | INTRAVENOUS | Status: AC
  Administered 2015-05-26: 0.7 ug/kg/h via INTRAVENOUS
  Administered 2015-05-27 (×2): 1.7 ug/kg/h via INTRAVENOUS
  Administered 2015-05-27: 1.6 ug/kg/h via INTRAVENOUS
  Administered 2015-05-27 (×2): 1.7 ug/kg/h via INTRAVENOUS
  Administered 2015-05-27: 1.1 ug/kg/h via INTRAVENOUS
  Administered 2015-05-28 (×5): 1.7 ug/kg/h via INTRAVENOUS
  Administered 2015-05-28: 1.3 ug/kg/h via INTRAVENOUS
  Administered 2015-05-29 (×3): 1.7 ug/kg/h via INTRAVENOUS
  Filled 2015-05-26: qty 200
  Filled 2015-05-26: qty 50
  Filled 2015-05-26 (×9): qty 100
  Filled 2015-05-26 (×2): qty 50
  Filled 2015-05-26: qty 100
  Filled 2015-05-26 (×2): qty 50
  Filled 2015-05-26 (×2): qty 100
  Filled 2015-05-26: qty 50
  Filled 2015-05-26: qty 100

## 2015-05-26 NOTE — Progress Notes (Signed)
[facility] Progress Note    Subjective:    Became agitated and combative, threatening to staff during the night Started on precedex for agitation, andi son D10 for persistent hypoglycemia Coughing - loose cough "I want my codeine" Allows me to examine her Initiating HD   Objective Filed Vitals:   05/26/15 0400 05/26/15 0500 05/26/15 0600 05/26/15 0714  BP: 117/84 136/85 111/79 126/78  Pulse: 89 88 81 85  Temp:      TempSrc:      Resp: 24 22 24 23   Weight:  64.7 kg (142 lb 10.2 oz)    SpO2: 100% 100% 98% 100%   Physical Exam BP 126/78 mmHg  Pulse 85  Temp(Src) 98.5 F (36.9 C) (Axillary)  Resp 23  Wt 64.7 kg (142 lb 10.2 oz)  SpO2 100%  General: Awakens and will converse - "I want my codeine" then back asleep Remains in 4 point restraints Dry crusty mucus membranes Heart:  S1S2 No S3 HR 80's sinus on tele Chest clear anteriorly despite loose cough Abdomen: soft Extremities:No edema Dialysis Access:  Right Upper AVF very aneurysmal - patent - + bruit  Dialysis Orders: no center  Additional Objective Labs: Basic Metabolic Panel:  Recent Labs Lab 05/23/15 0715 05/24/15 1400 05/25/15 0300  NA 135 138 137  K 6.8* 5.6* 4.3  CL 96* 97* 94*  CO2 15* 17* 26  GLUCOSE 100* 87 76  BUN 158* 114* 39*  CREATININE 18.42* 15.53* 7.54*  CALCIUM 9.3 9.7 9.8  PHOS 13.8* 11.6* 7.9*   Liver Function Tests:  Recent Labs Lab 05/19/15 0853 05/23/15 0715 05/24/15 1400  ALBUMIN 2.7* 2.7* 2.7*   CBC:  Recent Labs Lab 05/19/15 0854  05/22/15 1849 05/23/15 0714 05/24/15 1400 05/25/15 0300  WBC 5.2  --  5.0 6.3 4.7 5.0  NEUTROABS  --   --  3.0  --   --   --   HGB 8.2*  < > 7.9* 7.1* 7.5* 8.9*  HCT 24.6*  < > 24.0* 21.8* 23.4* 27.8*  MCV 83.1  --  82.2 80.1 81.0 84.2  PLT 232  --  232 250 196 233  < > = values in this interval not displayed.Iron Studies:   Recent Labs  05/23/15 0817  IRON 51  TIBC 252   Medications: . dexmedetomidine  0.4 mcg/kg/hr (05/26/15 0317)  . dextrose 50 mL/hr at 05/25/15 2215   . [START ON 05/31/2015] darbepoetin (ARANESP) injection - DIALYSIS  200 mcg Intravenous Q Thu-HD  . ferric gluconate (FERRLECIT/NULECIT) IV  250 mg Intravenous Q T,Th,Sa-HD  . folic acid  1 mg Oral Daily  . heparin  5,000 Units Subcutaneous 3 times per day  . insulin aspart  0-9 Units Subcutaneous 6 times per day  . insulin aspart  10 Units Intravenous Once  . metoprolol  5 mg Intravenous 4 times per day  . multivitamin with minerals  1 tablet Oral Daily  . QUEtiapine  50 mg Oral TID  . sevelamer carbonate  1,600 mg Oral TID WC  . sodium chloride flush  3 mL Intravenous Q12H  . thiamine  100 mg Oral Daily   Or  . thiamine  100 mg Intravenous Daily  . triamcinolone ointment   Topical BID    Assessment/Plan:  1. Bipolar/schizophrenia - MAJOR behavioral issues. Psych has seen. Seroquel tid ordered but she has had no doses in the hospital either from sedation or because she refused and it is highly unlikely that she will ever  take - here or at home. I suspect she would require some sort of parenteral depot medication to ever assure that she receives . (She really needs to be institutionalized so that her disease can be controlled) Currently requiring precedex just to allow general medical care and dialysis.  2. ESRD - has no dialysis home. Dismissed from units in [place] due to behavioral issues. Gets her dialysis on a prn basis, coming through the ER each time (her continued aggressive and inappropriate behaviors have been a major impediment to her receipt of care). She will not be able to dialyze anywhere in [place] at this time. She will be dialyzed today.  Hyperkalemia has resolved with regular receipt of HD. If her disease can ever be controlled we will have to reconsider taking her back as a patient for outpt treatment - but that is a long ways off and her psyche issues will have to e=be VERY well controlled for this to ever  happen  3. Aneurysmal AVF - AVF evaluated by VVS who felt no intervention needed.    4. Anemia - Started weekly Aranesp 200 q Wed. Received dose on 2/15. As we are trying to get on a regular TTS schedule will change to QThurs. Getting ferrlecit in HD.   Tsat 20% - will give ferrlicit [id number] x 4 in HD only. Has had 1 dose - give 3 more.   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 Phos 11-13. Will take advantage of current admission to check these things and get her on some binders if she will take them. Start Renvela with meals 2 TID ac when she will take po - not on VDRA- check iPTH which is likely to be quite elevated (was 880 in 09/2014)   6. Nutrition -  on multivit/renavite  7. HTN/ volume - BP poorly controlled - net UF 2/15 was 1839 with post wt 67.2 - continue to titrate volume down  8. DM - per primary; no BS  Jamal Maes, MD [facility] [phone] Pager 05/26/2015, 7:50 AM

## 2015-05-26 NOTE — Progress Notes (Signed)
PULMONARY / CRITICAL CARE MEDICINE   Name: Sara [name] MRN: [id number] DOB: Dec 25, 1980    ADMISSION DATE:  05/22/2015 CONSULTATION DATE:  05/24/2015  REFERRING MD:  [name]  CHIEF COMPLAINT:  Combative and refusing dialysis.  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  Involuntarily committed by her mother and psych.  Now very combative and refusing dialysis.  PCCM was called since patient is refusing everything but has to have dialysis.    SUBJECTIVE:  Remains on precedex gtt comabative with sitter Undergoing HD   VITAL SIGNS: BP 114/67 mmHg  Pulse 81  Temp(Src) 98.2 F (36.8 C) (Oral)  Resp 23  Wt 64.7 kg (142 lb 10.2 oz)  SpO2 100%  HEMODYNAMICS:    VENTILATOR SETTINGS:    INTAKE / OUTPUT: I/O last 3 completed shifts: In: 1287.8 [I.V.:1287.8] Out: -   PHYSICAL EXAMINATION: General:  Chronically ill appearing female, 4 point restraint in bed. Neuro:  rass -1, will talk, moves all 4 Es HEENT:  [place]/AT, PERRL Cardiovascular:  RRR, Nl S1/S2, -M/R/G. Lungs: ronchi Abdomen:  Diffuse pain.  ND and +BS. Musculoskeletal:  -edema and -tenderness.  Right arm fistula. Skin:  Intact.  LABS:  BMET  Recent Labs Lab 05/23/15 0715 05/24/15 1400 05/25/15 0300  NA 135 138 137  K 6.8* 5.6* 4.3  CL 96* 97* 94*  CO2 15* 17* 26  BUN 158* 114* 39*  CREATININE 18.42* 15.53* 7.54*  GLUCOSE 100* 87 76    Electrolytes  Recent Labs Lab 05/23/15 0715 05/24/15 1400 05/25/15 0300  CALCIUM 9.3 9.7 9.8  MG  --   --  2.2  PHOS 13.8* 11.6* 7.9*    CBC  Recent Labs Lab 05/23/15 0714 05/24/15 1400 05/25/15 0300  WBC 6.3 4.7 5.0  HGB 7.1* 7.5* 8.9*  HCT 21.8* 23.4* 27.8*  PLT 250 196 233    Coag's No results for input(s): APTT, INR in the last 168 hours.  Sepsis Markers No results for  input(s): LATICACIDVEN, PROCALCITON, O2SATVEN in the last 168 hours.  ABG No results for input(s): PHART, PCO2ART, PO2ART in the last 168 hours.  Liver Enzymes  Recent Labs Lab 05/23/15 0715 05/24/15 1400  ALBUMIN 2.7* 2.7*    Cardiac Enzymes No results for input(s): TROPONINI, PROBNP in the last 168 hours.  Glucose  Recent Labs Lab 05/25/15 1228 05/25/15 1533 05/25/15 1607 05/25/15 1709 05/25/15 2009 05/25/15 2048  GLUCAP 85 47* 124* 108* 67 144*    Imaging Dg Chest Port 1 View  05/26/2015  CLINICAL DATA:  35 year old female with atelectasis EXAM: PORTABLE CHEST 1 VIEW COMPARISON:  Prior chest x-ray 05/15/2015 FINDINGS: Stable enlargement of the cardiopericardial silhouette. Persistent pulmonary vascular congestion. Developing nonspecific airspace opacity in the right mid lung. Cyst probable bibasilar atelectasis. No pneumothorax. No acute osseous abnormality. IMPRESSION: 1. Developing nonspecific patchy airspace opacity in the right mid lung may represent pneumonia or asymmetric pulmonary edema. 2. Otherwise, unchanged cardiomegaly and mild pulmonary edema. Electronically Signed   By: Jacqulynn Cadet M.D.   On: 05/26/2015 07:41     STUDIES:  None  CULTURES: None  ANTIBIOTICS: None  SIGNIFICANT EVENTS: 2/16 transfer to the ICU for sedation for dialysis., safely got ativan   LINES/TUBES: Right sided fistula.  DISCUSSION: 35 year old female multiple psych disorders, combative, committed and refusing dialysis.  ASSESSMENT / PLAN:  PULMONARY A: BL infiltrates ? Aspiration pna vs edema Protecting airway thus far P:   Rpt CXR post HD to see if infx resolve  CARDIOVASCULAR A: Cardiomyopathy - EF 40% in 08/2014 Sinus tach - agitated. P:  Tele Consider co2 detector, post tidal  RENAL A:   ESRD-HD P:   HD  Today  GASTROINTESTINAL A:   Hungry P:   Renal diet post dialysis if able, unlikely  HEMATOLOGIC A:   Did not allow blood draws. P:  CBC  post dialysis.  INFECTIOUS A:   No active infection. P:   Monitor.  ENDOCRINE A:   Hypoglycemia  P:   D10 while npo   NEUROLOGIC A:   Schizophrenia P:   Haldol allergy, not giving Ativan IV + zyprexa IM q 8h precedex gtt - attempt to taper Psych medications per pscyh MD - need FU !!   Summary - agree that long term HD would require better control of her psychosis - likely with long acting depot meds Need psych input for this Perhaps institutionalisation long term ?  The patient is critically ill with multiple organ systems failure and requires high complexity decision making for assessment and support, frequent evaluation and titration of therapies, application of advanced monitoring technologies and extensive interpretation of multiple databases. Critical Care Time devoted to patient care services described in this note independent of APP time is 31 minutes.    Kara Mead MD. [place]. [facility] Pager [phone] If no response call [phone]

## 2015-05-26 NOTE — Progress Notes (Signed)
Pt. Spitting, and biting trying to get out of restraints. Increased Precedex drip to 1.6mcg per md order. Pt. Refusing to wear EtC02 monitor.

## 2015-05-26 NOTE — Procedures (Signed)
I have personally attended this patient's dialysis session.  On precedex for agitation and access being cannulated at this time. Plan for 4 hour treatment, labs pending.  Aneurysmal R upper arm AVF patent.   Jamal Maes, MD [facility] [phone] Pager 05/26/2015, 8:11 AM

## 2015-05-26 NOTE — Progress Notes (Signed)
[facility] Progress Note Patient Name: Sara [name] DOB: September 25, 1980 MRN: [id number]   Date of Service  05/26/2015  HPI/Events of Note  Agitation/Delirium - patient spitting, kicking and got out of ankle restraint on Precedex IV infusion at 0.7 mcg/kg/hour.  eICU Interventions  Will increase ceiling on Precedex IV infusion to 1.2 mcg/kg/hour.      Intervention Category Major Interventions: Delirium, psychosis, severe agitation - evaluation and management  Sommer,Steven Eugene 05/26/2015, 8:24 PM

## 2015-05-26 NOTE — Progress Notes (Signed)
[facility] Progress Note Patient Name: Sara [name] DOB: 06/12/1980 MRN: [id number]   Date of Service  05/26/2015  HPI/Events of Note  Agitation/Delirium - Patient is on Precedex IV infusion at 1.2 mcg/kg/hour. Multiple allergies to Haldol, Geodan, Lorazepam etc.   eICU Interventions  Will order: 1. Increase ceiling on Precedex IV infusion to 1.7 mcg/kg/hour. 2. Place EtCO2 monitor.     Intervention Category Major Interventions: Delirium, psychosis, severe agitation - evaluation and management  Sommer,Steven Eugene 05/26/2015, 9:41 PM

## 2015-05-27 ENCOUNTER — Inpatient Hospital Stay (HOSPITAL_COMMUNITY): Payer: Medicare Other

## 2015-05-27 DIAGNOSIS — G9340 Encephalopathy, unspecified: Secondary | ICD-10-CM

## 2015-05-27 LAB — CBC
HCT: 30.5 % — ABNORMAL LOW (ref 36.0–46.0)
HEMOGLOBIN: 9.4 g/dL — AB (ref 12.0–15.0)
MCH: 25.9 pg — AB (ref 26.0–34.0)
MCHC: 30.8 g/dL (ref 30.0–36.0)
MCV: 84 fL (ref 78.0–100.0)
PLATELETS: 235 10*3/uL (ref 150–400)
RBC: 3.63 MIL/uL — ABNORMAL LOW (ref 3.87–5.11)
RDW: 18.3 % — AB (ref 11.5–15.5)
WBC: 4 10*3/uL (ref 4.0–10.5)

## 2015-05-27 LAB — GLUCOSE, CAPILLARY
GLUCOSE-CAPILLARY: 111 mg/dL — AB (ref 65–99)
GLUCOSE-CAPILLARY: 119 mg/dL — AB (ref 65–99)
Glucose-Capillary: 104 mg/dL — ABNORMAL HIGH (ref 65–99)
Glucose-Capillary: 109 mg/dL — ABNORMAL HIGH (ref 65–99)
Glucose-Capillary: 110 mg/dL — ABNORMAL HIGH (ref 65–99)

## 2015-05-27 LAB — RENAL FUNCTION PANEL
Albumin: 2 g/dL — ABNORMAL LOW (ref 3.5–5.0)
Anion gap: 11 (ref 5–15)
BUN: 15 mg/dL (ref 6–20)
CALCIUM: 8.6 mg/dL — AB (ref 8.9–10.3)
CHLORIDE: 96 mmol/L — AB (ref 101–111)
CO2: 28 mmol/L (ref 22–32)
CREATININE: 5.93 mg/dL — AB (ref 0.44–1.00)
GFR calc Af Amer: 10 mL/min — ABNORMAL LOW (ref >60)
GFR calc non Af Amer: 9 mL/min — ABNORMAL LOW (ref >60)
GLUCOSE: 122 mg/dL — AB (ref 65–99)
PHOSPHORUS: 6.4 mg/dL — AB (ref 2.5–4.6)
POTASSIUM: 4 mmol/L (ref 3.5–5.1)
Sodium: 135 mmol/L (ref 135–145)

## 2015-05-27 LAB — PARATHYROID HORMONE, INTACT (NO CA): PTH: 420 pg/mL — ABNORMAL HIGH (ref 15–65)

## 2015-05-27 LAB — MRSA PCR SCREENING: MRSA by PCR: NEGATIVE

## 2015-05-27 MED ORDER — OLANZAPINE 10 MG IM SOLR
5.0000 mg | Freq: Three times a day (TID) | INTRAMUSCULAR | Status: DC
  Administered 2015-05-27 – 2015-05-30 (×10): 5 mg via INTRAMUSCULAR
  Filled 2015-05-27 (×12): qty 10

## 2015-05-27 MED ORDER — QUETIAPINE FUMARATE ER 200 MG PO TB24
200.0000 mg | ORAL_TABLET | Freq: Every day | ORAL | Status: DC
  Filled 2015-05-27 (×2): qty 1

## 2015-05-27 NOTE — Progress Notes (Signed)
[facility] Progress Note Patient Name: Sara [name] DOB: [birth year] MRN: [id number]   Date of Service  05/27/2015  HPI/Events of Note  Dr. Elsworth Soho note states that patient is to be on a renal diet. No order written.   eICU Interventions  Will order a Renal Diet with 1200 mL fluid restriction.     Intervention Category Minor Interventions: Routine modifications to care plan (e.g. PRN medications for pain, fever)  Khris Jansson Eugene 05/27/2015, 4:52 PM

## 2015-05-27 NOTE — Progress Notes (Signed)
Patient is refusing to wear continuous pulse ox as well at intermit. checks. She continues to be verbally and physically aggressive with staff. Will continue to monitor patients mental orientation.

## 2015-05-27 NOTE — Progress Notes (Signed)
[facility] Progress Note    Subjective:    Remains on precedex - at 1.7  Increased during the night due to spitting, kicking This AM refusing BS Still coughing Had HD yesterday - tried to refuse her iron   Objective Filed Vitals:   05/27/15 0500 05/27/15 0600 05/27/15 0700 05/27/15 0806  BP: 123/79 114/89 120/83   Pulse: 71 71 70   Temp:    98.1 F (36.7 C)  TempSrc:    Oral  Resp: 20 23 22    Weight:      SpO2: 96% 98% 100%    Physical Exam BP 120/83 mmHg  Pulse 70  Temp(Src) 98.1 F (36.7 C) (Oral)  Resp 22  Wt 63.8 kg (140 lb 10.5 oz)  SpO2 100%  General: Awakens and will converse - "I want my codeine" then back asleep Remains in 4 point restraints Dry crusty mucus membranes Heart:  S1S2 No S3 HR 80's sinus on tele Chest clear anteriorly despite loose cough Abdomen: soft Extremities:No edema Dialysis Access:  Right Upper AVF very aneurysmal - patent - + bruit  Dialysis Orders: no center  Additional Objective Labs: Basic Metabolic Panel:  Recent Labs Lab 05/25/15 0300 05/26/15 0900 05/27/15 0305  NA 137 129* 135  K 4.3 4.7 4.0  CL 94* 89* 96*  CO2 26 24 28   GLUCOSE 76 88 122*  BUN 39* 47* 15  CREATININE 7.54* 10.25* 5.93*  CALCIUM 9.8 8.4* 8.6*  PHOS 7.9* 9.7* 6.4*   Liver Function Tests:  Recent Labs Lab 05/24/15 1400 05/26/15 0900 05/27/15 0305  ALBUMIN 2.7* 2.0* 2.0*   CBC:  Recent Labs Lab 05/22/15 1849 05/23/15 0714 05/24/15 1400 05/25/15 0300 05/26/15 0900 05/27/15 0305  WBC 5.0 6.3 4.7 5.0 4.1 4.0  NEUTROABS 3.0  --   --   --   --   --   HGB 7.9* 7.1* 7.5* 8.9* 8.0* 9.4*  HCT 24.0* 21.8* 23.4* 27.8* 25.0* 30.5*  MCV 82.2 80.1 81.0 84.2 83.6 84.0  PLT 232 250 196 233 227 235   Infusions . dexmedetomidine 1.7 mcg/kg/hr (05/27/15 [id number])  . dextrose 50 mL/hr at 05/25/15 2215   . [START ON 05/31/2015] darbepoetin (ARANESP) injection - DIALYSIS  200 mcg Intravenous Q Thu-HD  . ferric gluconate  (FERRLECIT/NULECIT) IV  250 mg Intravenous Q T,Th,Sa-HD  . folic acid  1 mg Oral Daily  . heparin  5,000 Units Subcutaneous 3 times per day  . insulin aspart  0-9 Units Subcutaneous 6 times per day  . insulin aspart  10 Units Intravenous Once  . metoprolol  5 mg Intravenous 4 times per day  . multivitamin with minerals  1 tablet Oral Daily  . QUEtiapine  50 mg Oral TID  . sevelamer carbonate  1,600 mg Oral TID WC  . sodium chloride flush  3 mL Intravenous Q12H  . thiamine  100 mg Oral Daily   Or  . thiamine  100 mg Intravenous Daily  . triamcinolone ointment   Topical BID    Assessment/Plan:  1. Bipolar/schizophrenia - MAJOR behavioral issues. Psych has seen. Seroquel tid ordered but she has had no doses in the hospital either from sedation or because she refused and it is highly unlikely that she will ever take - here or at home. I suspect she would require some sort of parenteral depot medication to ever assure that she receives . (She really needs to be institutionalized so that her disease can be controlled) Currently requiring precedex just  to allow general medical care and dialysis. Is not presently getting any psyche meds at all - Dr. Elsworth Soho plans zyprexa IM Q8H  2. ESRD - has no dialysis home. Dismissed from units in [place] due to behavioral issues. Has been getting her dialysis on a prn basis, coming through the ER each time (her continued aggressive and inappropriate behaviors have been a major impediment to her receipt of care). She will not be able to dialyze anywhere in [place] at this time.  Hyperkalemia has resolved with regular receipt of HD. Current inpt schedule is TTS.  If her disease can ever be controlled we will have to reconsider taking her back as a patient for outpt treatment - but that is a long ways off and her psyche issues will have to be VERY well controlled for this to ever happen  3. Aneurysmal AVF - AVF evaluated by VVS who felt no intervention needed.    4. Anemia -  Started weekly Aranesp 200 q Wed. Received dose on 2/15. As we are trying to get on a regular TTS schedule will change to QThurs. Getting ferrlecit in HD.   Tsat 123456 - for  ferrlicit [id number] x 4 in HD only. Has had 2 doses - give 2 more. 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 Phos 11-13. Will take advantage of current admission to check these things and get her on some binders if she will take them. Start Renvela with meals 2 TID ac when she will take po - not on VDRA- check iPTH which is likely to be quite elevated (was 880 in 09/2014) (still pending)   6. HTN/ volume - BP and volume good now that well dialyzed  7. DM - per primary; no BS  Jamal Maes, MD [facility] [phone] Pager 05/27/2015, 8:22 AM

## 2015-05-27 NOTE — Progress Notes (Signed)
PULMONARY / CRITICAL CARE MEDICINE   Name: Sara [name] MRN: [id number] DOB: 1981/02/04    ADMISSION DATE:  05/22/2015 CONSULTATION DATE:  05/24/2015  REFERRING MD:  [name]  CHIEF COMPLAINT:  Combative and refusing dialysis.  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  Involuntarily committed by her mother and psych.  Now very combative and refusing dialysis.  PCCM was called since patient is refusing everything but has to have dialysis.    SUBJECTIVE:  Remains on precedex gtt comabative with sitter & RN, more belligerent today   VITAL SIGNS: BP 122/88 mmHg  Pulse 70  Temp(Src) 98.1 F (36.7 C) (Oral)  Resp 17  Wt 63.8 kg (140 lb 10.5 oz)  SpO2 100%  HEMODYNAMICS:    VENTILATOR SETTINGS:    INTAKE / OUTPUT: I/O last 3 completed shifts: In: 1465.2 [I.V.:1225.2; IV Piggyback:240] Out: 1000 [Other:1000]  PHYSICAL EXAMINATION: General:  Chronically ill appearing female, 4 point restraint in bed. Neuro:  rass -1 to +2, will talk, moves all 4 Es HEENT:  [place]/AT, PERRL Cardiovascular:  RRR, Nl S1/S2, -M/R/G. Lungs: ronchi Abdomen:  Diffuse pain.  ND and +BS. Musculoskeletal:  -edema and -tenderness.  Right arm fistula. Skin:  Intact.  LABS:  BMET  Recent Labs Lab 05/25/15 0300 05/26/15 0900 05/27/15 0305  NA 137 129* 135  K 4.3 4.7 4.0  CL 94* 89* 96*  CO2 26 24 28   BUN 39* 47* 15  CREATININE 7.54* 10.25* 5.93*  GLUCOSE 76 88 122*    Electrolytes  Recent Labs Lab 05/25/15 0300 05/26/15 0900 05/27/15 0305  CALCIUM 9.8 8.4* 8.6*  MG 2.2  --   --   PHOS 7.9* 9.7* 6.4*    CBC  Recent Labs Lab 05/25/15 0300 05/26/15 0900 05/27/15 0305  WBC 5.0 4.1 4.0  HGB 8.9* 8.0* 9.4*  HCT 27.8* 25.0* 30.5*  PLT 233 227 235    Coag's No results for input(s): APTT, INR in the last 168  hours.  Sepsis Markers No results for input(s): LATICACIDVEN, PROCALCITON, O2SATVEN in the last 168 hours.  ABG No results for input(s): PHART, PCO2ART, PO2ART in the last 168 hours.  Liver Enzymes  Recent Labs Lab 05/24/15 1400 05/26/15 0900 05/27/15 0305  ALBUMIN 2.7* 2.0* 2.0*    Cardiac Enzymes No results for input(s): TROPONINI, PROBNP in the last 168 hours.  Glucose  Recent Labs Lab 05/26/15 0358 05/26/15 0757 05/26/15 1217 05/26/15 2058 05/26/15 2343 05/27/15 0812  GLUCAP 99 97 89 113* 111* 104*    Imaging No results found.   STUDIES:  None  CULTURES: None  ANTIBIOTICS: None  SIGNIFICANT EVENTS: 2/16 transfer to the ICU for sedation for dialysis., safely got ativan   LINES/TUBES: Right sided fistula.  DISCUSSION: 35 year old female multiple psych disorders, combative, committed and refusing dialysis.  ASSESSMENT / PLAN:  PULMONARY A: BL infiltrates ? Aspiration pna vs edema Protecting airway thus far P:   Rpt CXR post HD to see if infx resolve  CARDIOVASCULAR A: Cardiomyopathy - EF 40% in 08/2014 Sinus tach - agitated. P:  Tele  RENAL A:   ESRD-HD P:   HD  Per renal   GASTROINTESTINAL A:    malnutrition P:   Renal diet   HEMATOLOGIC A:   Did not allow blood draws. P:  CBC post dialysis.  INFECTIOUS  A:   No active infection. P:   Monitor.  ENDOCRINE A:   Hypoglycemia  P:   D10 while npo   NEUROLOGIC A:   Schizophrenia P:   Haldol allergy, not giving Ativan IV + zyprexa IM q 8h - unable to get these meds due to violent behaviour precedex gtt - attempt to taper Psych medications per psych MD - need FU !! I d/w them about considering depot meds   Summary - agree that long term HD would require better control of her psychosis - likely with long acting depot meds Need psych input for this Perhaps institutionalise long term ? Need mother input if she lacks capacity  The patient is critically ill with  multiple organ systems failure and requires high complexity decision making for assessment and support, frequent evaluation and titration of therapies, application of advanced monitoring technologies and extensive interpretation of multiple databases. Critical Care Time devoted to patient care services described in this note independent of APP time is 31 minutes.    Kara Mead MD. [place]. [facility] Pager [phone] If no response call [phone]

## 2015-05-28 DIAGNOSIS — F200 Paranoid schizophrenia: Secondary | ICD-10-CM

## 2015-05-28 DIAGNOSIS — G9340 Encephalopathy, unspecified: Secondary | ICD-10-CM | POA: Insufficient documentation

## 2015-05-28 DIAGNOSIS — R4689 Other symptoms and signs involving appearance and behavior: Secondary | ICD-10-CM

## 2015-05-28 LAB — RENAL FUNCTION PANEL
Albumin: 2 g/dL — ABNORMAL LOW (ref 3.5–5.0)
Anion gap: 14 (ref 5–15)
BUN: 21 mg/dL — ABNORMAL HIGH (ref 6–20)
CALCIUM: 8.6 mg/dL — AB (ref 8.9–10.3)
CHLORIDE: 95 mmol/L — AB (ref 101–111)
CO2: 25 mmol/L (ref 22–32)
CREATININE: 7.44 mg/dL — AB (ref 0.44–1.00)
GFR calc Af Amer: 7 mL/min — ABNORMAL LOW (ref >60)
GFR calc non Af Amer: 6 mL/min — ABNORMAL LOW (ref >60)
GLUCOSE: 107 mg/dL — AB (ref 65–99)
Phosphorus: 7.5 mg/dL — ABNORMAL HIGH (ref 2.5–4.6)
Potassium: 4.2 mmol/L (ref 3.5–5.1)
SODIUM: 134 mmol/L — AB (ref 135–145)

## 2015-05-28 LAB — GLUCOSE, CAPILLARY
Glucose-Capillary: 101 mg/dL — ABNORMAL HIGH (ref 65–99)
Glucose-Capillary: 105 mg/dL — ABNORMAL HIGH (ref 65–99)
Glucose-Capillary: 86 mg/dL (ref 65–99)
Glucose-Capillary: 92 mg/dL (ref 65–99)
Glucose-Capillary: 94 mg/dL (ref 65–99)
Glucose-Capillary: 98 mg/dL (ref 65–99)

## 2015-05-28 MED ORDER — LORAZEPAM 2 MG/ML IJ SOLN
2.0000 mg | Freq: Three times a day (TID) | INTRAMUSCULAR | Status: DC | PRN
  Administered 2015-05-28 – 2015-05-30 (×4): 2 mg via INTRAMUSCULAR
  Filled 2015-05-28 (×4): qty 1

## 2015-05-28 MED ORDER — RISPERIDONE MICROSPHERES 25 MG IM SUSR
25.0000 mg | INTRAMUSCULAR | Status: DC
  Administered 2015-05-28: 25 mg via INTRAMUSCULAR
  Filled 2015-05-28: qty 2

## 2015-05-28 NOTE — Progress Notes (Signed)
UR Completed. Zenith Lamphier, RN, BSN.  [phone] 

## 2015-05-28 NOTE — Progress Notes (Signed)
CSW engaged with Psych CSW and Psychiatrist re: Patient's disposition. Due to Patient's continued aggression, [facility] is probably the best fit for Patient when medically cleared. Psych CSW will initiate referral when Patient is medically cleared. Patient currently remains on precedex. Per Psych, [facility] waitlist is 3-4 weeks long. CSW will continue to follow.    Holly Bodily, LCSW  [facility] ED/ [place] Social Worker [phone]

## 2015-05-28 NOTE — Progress Notes (Signed)
Pt became extremely agitated and combative during an attempt to change bottom sheet. She attempted to bite staff, kick staff, and bite restraints off her wrists. She also began spitting at staff. Sheets and bottom pad were not changed as it was unsafe to continue. Will attempt later if patient does not remain aggressive to staff.

## 2015-05-28 NOTE — Progress Notes (Addendum)
PULMONARY / CRITICAL CARE MEDICINE   Name: Sara [name] MRN: [id number] DOB: [birth year]    ADMISSION DATE:  05/22/2015 CONSULTATION DATE:  05/24/2015  REFERRING MD:  [name]  CHIEF COMPLAINT:  Combative and refusing dialysis.  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  Involuntarily committed by her mother and psych.  Now very combative and refusing dialysis.  PCCM was called since patient is refusing everything but has to have dialysis.    SUBJECTIVE:  Remains on precedex gtt Verbally and physically abusive overnight.    VITAL SIGNS: BP 133/83 mmHg  Pulse 64  Temp(Src) 97.4 F (36.3 C) (Oral)  Resp 20  Wt 151 lb 0.2 oz (68.5 kg)  SpO2 96%  HEMODYNAMICS:    VENTILATOR SETTINGS:    INTAKE / OUTPUT: I/O last 3 completed shifts: In: 2002 [I.V.:2002] Out: -   PHYSICAL EXAMINATION: General:  Chronically ill appearing female, 4 point restraint in bed. Neuro:  Sedated, Agitated with stimulation. HEENT:  [place]/AT, PERRL Cardiovascular:  RRR, Nl S1/S2, -M/R/G. Lungs: ronchi on the right.  Abdomen:  Diffuse pain.  ND and +BS. Musculoskeletal:  -edema and -tenderness.  Right arm fistula. Skin:  Intact.  LABS:  BMET  Recent Labs Lab 05/26/15 0900 05/27/15 0305 05/28/15 0226  NA 129* 135 134*  K 4.7 4.0 4.2  CL 89* 96* 95*  CO2 24 28 25   BUN 47* 15 21*  CREATININE 10.25* 5.93* 7.44*  GLUCOSE 88 122* 107*    Electrolytes  Recent Labs Lab 05/25/15 0300 05/26/15 0900 05/27/15 0305 05/28/15 0226  CALCIUM 9.8 8.4* 8.6* 8.6*  MG 2.2  --   --   --   PHOS 7.9* 9.7* 6.4* 7.5*    CBC  Recent Labs Lab 05/25/15 0300 05/26/15 0900 05/27/15 0305  WBC 5.0 4.1 4.0  HGB 8.9* 8.0* 9.4*  HCT 27.8* 25.0* 30.5*  PLT 233 227 235    Coag's No results for input(s): APTT, INR in the last 168  hours.  Sepsis Markers No results for input(s): LATICACIDVEN, PROCALCITON, O2SATVEN in the last 168 hours.  ABG No results for input(s): PHART, PCO2ART, PO2ART in the last 168 hours.  Liver Enzymes  Recent Labs Lab 05/26/15 0900 05/27/15 0305 05/28/15 0226  ALBUMIN 2.0* 2.0* 2.0*    Cardiac Enzymes No results for input(s): TROPONINI, PROBNP in the last 168 hours.  Glucose  Recent Labs Lab 05/27/15 0812 05/27/15 1225 05/27/15 1623 05/27/15 2019 05/28/15 0002 05/28/15 0354  GLUCAP 104* 119* 109* 110* 101* 92    Imaging Dg Chest Port 1 View  05/27/2015  CLINICAL DATA:  Followup patchy opacity in the right mid lung zone. EXAM: PORTABLE CHEST 1 VIEW COMPARISON:  Yesterday. FINDINGS: The previously seen patchy opacity in the right mid lung zone is more consolidated, occupying a smaller volume and denser. No significant change in mild left basilar atelectasis and enlargement of the cardiac silhouette. Minimal right pleural fluid with improvement. Possible minimal left pleural fluid. Lytic lesions in both distal clavicles. IMPRESSION: 1. Persistent airspace opacity in the right mid lung zone with a more atelectatic appearance. 2. Decreased size of a small right pleural effusion. 3. Stable cardiomegaly and left basilar atelectasis. 4. Lytic lesions in both distal clavicles. These could represent metastases or posttraumatic lysis. Electronically Signed   By: Claudie Revering M.D.   On:  05/27/2015 12:24     STUDIES:  None  CULTURES: None  ANTIBIOTICS: None  SIGNIFICANT EVENTS: 2/16 transfer to the ICU for sedation for dialysis., safely got ativan   LINES/TUBES: Right sided fistula.  DISCUSSION: 35 year old female multiple psych disorders, combative, committed and refusing dialysis.  ASSESSMENT / PLAN:  PULMONARY A: BL infiltrates ? Aspiration pna vs edema Protecting airway thus far P:   Persistent Infiltrate Right Mid Lung - Consider treating as HCAP if fever, or  worsening VS.   CARDIOVASCULAR A: Cardiomyopathy - EF 40% in 08/2014 Sinus tach - agitated. P:  Tele  RENAL A:   ESRD-HD P:   HD  Per renal Pt. Refusing multiple medications  Ongoing difficult management.    GASTROINTESTINAL A:    malnutrition P:   Renal diet   HEMATOLOGIC A:   Did not allow blood draws. P:  CBC post dialysis.  INFECTIOUS A:   No active infection. P:   Monitor. Consider Treating HCAP.   ENDOCRINE A:   Hypoglycemia  P:   D10 while npo   NEUROLOGIC A:   Schizophrenia P:   Haldol allergy, not giving Ativan IV + zyprexa IM q 8h - unable to get these meds due to violent behaviour precedex gtt - attempt to taper as able.  Psych medications per psych MD - need FU !! I d/w them about considering depot meds Call again today.    Summary - agree that long term HD would require better control of her psychosis - likely with long acting depot meds Need psych input for this  Perhaps institutionalise long term ? Need mother input if she lacks capacity Overall care is limited by her behavioral issues as noted by Dr. Elsworth Soho above.   Paula Compton, MD Resident PGY 2

## 2015-05-28 NOTE — Care Management Important Message (Signed)
Important Message  Patient Details  Name: Sara [name] MRN: [id number] Date of Birth: Jan 18, 1981   Medicare Important Message Given:  Yes    Loann Quill 05/28/2015, 10:56 AM

## 2015-05-28 NOTE — Progress Notes (Signed)
Pt asked to be signed out AMA. When I explained to her that the doctor would not allow her to leave she stated "I will walk out of her butt naked and if you don't give me my things [belongings] back, I'll come back with a gun."  Will continue to monitor this patient closely for both her safety as well as safety of staff.

## 2015-05-28 NOTE — Progress Notes (Signed)
[facility] Progress Note  Subjective:    Remains on precedex - at 1.3, down a little  Objective Filed Vitals:   05/28/15 0700 05/28/15 0759 05/28/15 0800 05/28/15 0900  BP: 118/83  116/85 117/85  Pulse: 63  64 62  Temp:  97.2 F (36.2 C)    TempSrc:  Oral    Resp: 17  17 14   Weight:      SpO2: 96%  98% 97%   Physical Exam BP 117/85 mmHg  Pulse 62  Temp(Src) 97.2 F (36.2 C) (Oral)  Resp 14  Wt 68.5 kg (151 lb 0.2 oz)  SpO2 97% General: Awakens and will converse, slurred speech prob from Precedex Remains in 4 point restraints Dry crusty mucus membranes Heart: RRR no mrg S1S2 No S3 HR 80's sinus on tele Chest clear anteriorly despite loose cough Abdomen: soft Extremities:No edema Dialysis Access:  Right Upper AVF very aneurysmal - patent - + bruit  Dialysis Orders: no center  Old orders from Feb 2016 [place] > 4 hrs59kg 2K/2CaHeparin [id number] RUA   Assessment/Plan: 1 Bipolar/schizophrenia - MAJOR behavioral issues. Psych has seen. Seroquel tid ordered but she has had no doses in the hospital either from sedation or because she refused. Has been getting IM Zyprexa tid for the last 48 hours successfully. Psych team is considering a depot-type medication 2 ESRD - had HD x 3 here so for. BUN/ Cr down to baseline. Has been dismissed from outpatient dialysis practice here in [place] due to behavior.  Not a good long-term solution in sight. 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 Received dose on 2/15. As we are trying to get on a regular TTS schedule will change to QThurs. Getting ferrlecit in HD.   Tsat 123456 - for  ferrlicit [id number] x 4 in HD only. Has had 2 doses - give 2 more. 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 Phos 11-13. Will take advantage of current admission to check these things and get her on some  binders if she will take them. Start Renvela with meals 2 TID ac when she will take po - not on VDRA- check iPTH which is likely to be quite elevated (was 880 in 09/2014) (still pending) 6 HTN/ volume - BP and volume good now that well dialyzed 7 DM - per primary; no BS  Kelly Splinter MD [facility] pager [phone]    cell [phone] 05/28/2015, 11:40 AM     Additional Objective Labs: Basic Metabolic Panel:  Recent Labs Lab 05/26/15 0900 05/27/15 0305 05/28/15 0226  NA 129* 135 134*  K 4.7 4.0 4.2  CL 89* 96* 95*  CO2 24 28 25   GLUCOSE 88 122* 107*  BUN 47* 15 21*  CREATININE 10.25* 5.93* 7.44*  CALCIUM 8.4* 8.6* 8.6*  PHOS 9.7* 6.4* 7.5*   Liver Function Tests:  Recent Labs Lab 05/26/15 0900 05/27/15 0305 05/28/15 0226  ALBUMIN 2.0* 2.0* 2.0*   CBC:  Recent Labs Lab 05/22/15 1849 05/23/15 0714 05/24/15 1400 05/25/15 0300 05/26/15 0900 05/27/15 0305  WBC 5.0 6.3 4.7 5.0 4.1 4.0  NEUTROABS 3.0  --   --   --   --   --   HGB 7.9* 7.1* 7.5* 8.9* 8.0* 9.4*  HCT 24.0* 21.8* 23.4* 27.8* 25.0* 30.5*  MCV 82.2 80.1 81.0 84.2 83.6 84.0  PLT 232 250 196 233 227 235   Infusions . dexmedetomidine 1.3 mcg/kg/hr (05/28/15 1052)  . dextrose 50 mL/hr at 05/28/15 1131   . [START ON 05/31/2015] darbepoetin (ARANESP) injection - DIALYSIS  200 mcg Intravenous Q Thu-HD  . ferric gluconate (FERRLECIT/NULECIT) IV  250 mg Intravenous Q T,Th,Sa-HD  . folic acid  1 mg Oral Daily  . heparin  5,000 Units Subcutaneous 3 times per day  . insulin aspart  0-9 Units Subcutaneous 6 times per day  . insulin aspart  10 Units Intravenous Once  . metoprolol  5 mg Intravenous 4 times per day  . multivitamin with minerals  1 tablet Oral Daily  . OLANZapine  5 mg Intramuscular Q8H  . QUEtiapine  200 mg Oral Daily  . sevelamer carbonate  1,600 mg Oral TID WC  . sodium chloride flush  3 mL Intravenous Q12H  . thiamine  100 mg Oral Daily   Or  . thiamine  100 mg Intravenous  Daily  . triamcinolone ointment   Topical BID
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
## 2015-05-29 LAB — CBC
HCT: 28.1 % — ABNORMAL LOW (ref 36.0–46.0)
Hemoglobin: 8.9 g/dL — ABNORMAL LOW (ref 12.0–15.0)
MCH: 25.9 pg — AB (ref 26.0–34.0)
MCHC: 31.7 g/dL (ref 30.0–36.0)
MCV: 81.9 fL (ref 78.0–100.0)
PLATELETS: 246 10*3/uL (ref 150–400)
RBC: 3.43 MIL/uL — AB (ref 3.87–5.11)
RDW: 17.7 % — ABNORMAL HIGH (ref 11.5–15.5)
WBC: 3.2 10*3/uL — ABNORMAL LOW (ref 4.0–10.5)

## 2015-05-29 LAB — GLUCOSE, CAPILLARY
GLUCOSE-CAPILLARY: 100 mg/dL — AB (ref 65–99)
GLUCOSE-CAPILLARY: 93 mg/dL (ref 65–99)
GLUCOSE-CAPILLARY: 53 mg/dL — AB (ref 65–99)
GLUCOSE-CAPILLARY: 74 mg/dL (ref 65–99)
Glucose-Capillary: 95 mg/dL (ref 65–99)
Glucose-Capillary: 99 mg/dL (ref 65–99)
Glucose-Capillary: 95 mg/dL (ref 65–99)
Glucose-Capillary: 61 mg/dL — ABNORMAL LOW (ref 65–99)

## 2015-05-29 LAB — RENAL FUNCTION PANEL
Albumin: 1.8 g/dL — ABNORMAL LOW (ref 3.5–5.0)
Anion gap: 13 (ref 5–15)
BUN: 25 mg/dL — AB (ref 6–20)
CHLORIDE: 93 mmol/L — AB (ref 101–111)
CO2: 25 mmol/L (ref 22–32)
Calcium: 8.6 mg/dL — ABNORMAL LOW (ref 8.9–10.3)
Creatinine, Ser: 9.14 mg/dL — ABNORMAL HIGH (ref 0.44–1.00)
GFR calc Af Amer: 6 mL/min — ABNORMAL LOW (ref >60)
GFR, EST NON AFRICAN AMERICAN: 5 mL/min — AB (ref >60)
GLUCOSE: 110 mg/dL — AB (ref 65–99)
POTASSIUM: 4.6 mmol/L (ref 3.5–5.1)
Phosphorus: 8.7 mg/dL — ABNORMAL HIGH (ref 2.5–4.6)
Sodium: 131 mmol/L — ABNORMAL LOW (ref 135–145)

## 2015-05-29 MED ORDER — DEXTROSE 50 % IV SOLN
INTRAVENOUS | Status: AC
  Administered 2015-05-29: 50 mL via INTRAVENOUS
  Filled 2015-05-29: qty 50

## 2015-05-29 MED ORDER — LIDOCAINE-PRILOCAINE 2.5-2.5 % EX CREA
1.0000 "application " | TOPICAL_CREAM | CUTANEOUS | Status: DC | PRN
  Filled 2015-05-29: qty 5

## 2015-05-29 MED ORDER — HEPARIN SODIUM (PORCINE) 1000 UNIT/ML DIALYSIS
4000.0000 [IU] | Freq: Once | INTRAMUSCULAR | Status: DC
  Filled 2015-05-29: qty 4

## 2015-05-29 MED ORDER — DEXMEDETOMIDINE HCL IN NACL 400 MCG/100ML IV SOLN
0.2000 ug/kg/h | INTRAVENOUS | Status: DC
  Administered 2015-05-29 (×3): 1.7 ug/kg/h via INTRAVENOUS
  Administered 2015-05-30: 1.6 ug/kg/h via INTRAVENOUS
  Administered 2015-05-30 (×2): 1.7 ug/kg/h via INTRAVENOUS
  Filled 2015-05-29: qty 100
  Filled 2015-05-29: qty 200
  Filled 2015-05-29: qty 100
  Filled 2015-05-29: qty 200
  Filled 2015-05-29: qty 100

## 2015-05-29 MED ORDER — SODIUM CHLORIDE 0.9 % IV SOLN: 100.0000 mL | INTRAVENOUS | Status: DC | PRN

## 2015-05-29 MED ORDER — DEXTROSE 50 % IV SOLN
25.0000 mL | Freq: Once | INTRAVENOUS | Status: AC
  Administered 2015-05-29: 25 mL via INTRAVENOUS

## 2015-05-29 MED ORDER — LIDOCAINE HCL (PF) 1 % IJ SOLN: 5.0000 mL | INTRAMUSCULAR | Status: DC | PRN

## 2015-05-29 MED ORDER — ALTEPLASE 2 MG IJ SOLR
2.0000 mg | Freq: Once | INTRAMUSCULAR | Status: DC | PRN
  Filled 2015-05-29: qty 2

## 2015-05-29 MED ORDER — DEXTROSE 50 % IV SOLN
INTRAVENOUS | Status: AC
  Administered 2015-05-29: 25 mL via INTRAVENOUS
  Filled 2015-05-29: qty 50

## 2015-05-29 MED ORDER — ALBUMIN HUMAN 25 % IV SOLN
25.0000 g | Freq: Once | INTRAVENOUS | Status: AC
  Administered 2015-05-29: 25 g via INTRAVENOUS
  Filled 2015-05-29: qty 50

## 2015-05-29 MED ORDER — PENTAFLUOROPROP-TETRAFLUOROETH EX AERO: 1.0000 "application " | INHALATION_SPRAY | CUTANEOUS | Status: DC | PRN

## 2015-05-29 MED ORDER — HEPARIN SODIUM (PORCINE) 1000 UNIT/ML DIALYSIS: 1000.0000 [IU] | INTRAMUSCULAR | Status: DC | PRN

## 2015-05-29 NOTE — Progress Notes (Addendum)
Arrived to patient room 72M-07 at 0857.  Reviewed treatment plan and this RN agrees.  Report received from bedside RN, Verdis Frederickson.  Consent verified.  Patient sedated, drowsy. Lung sounds clear to ausculation in all fields. BUE moderate edema. Cardiac: Regular R&R, some ectopy noted.  Prepped RUAVF with alcohol and cannulated with two 15 gauge needles.  Pulsation of blood noted.  Flushed access well with saline per protocol.  Connected and secured lines and initiated tx at 0936.  UF goal of 2500 mL and net fluid removal of 2000 mL.  Will continue to monitor.

## 2015-05-29 NOTE — Progress Notes (Signed)
Pt refused 12 EKG. MD aware

## 2015-05-29 NOTE — Progress Notes (Signed)
Dialysis treatment completed.  2000 mL ultrafiltrated and net fluid removal 1500 mL.    Patient status unchanged. Lung sounds clear to ausculation in all fields. BUE moderate edema. Cardiac: Regular R&R, some ectopy noted.  Disconnected lines and removed needles.  Pressure held for 10 minutes and band aid/gauze dressing applied.  Report given to bedside RN, Verdis Frederickson.    25g albumin given intra treatment for decreased blood pressure, per physician telephone order.

## 2015-05-29 NOTE — Progress Notes (Signed)
Blood sugar in 60s. Dext 50 25mg  given per protocol. Recheck in 90s. No symptoms of discomfort, s/s of hypoglycemia noted. Will watch closely

## 2015-05-29 NOTE — Progress Notes (Signed)
PULMONARY / CRITICAL CARE MEDICINE   Name: NANDA CZAP MRN: [id number] DOB: 02/22/1981    ADMISSION DATE:  05/22/2015 CONSULTATION DATE:  05/24/2015  REFERRING MD:  [name]  CHIEF COMPLAINT:  Combative and refusing dialysis.  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  Involuntarily committed by her mother and psych.  Now very combative and refusing dialysis.  PCCM was called since patient is refusing everything but has to have dialysis.    SUBJECTIVE:  Remains on precedex gtt 1.7 this am, had to increase dose from 1.3 Got the Risperdal yesterday Still somewhat agitated this am.    VITAL SIGNS: BP 113/78 mmHg  Pulse 29  Temp(Src) 96.3 F (35.7 C) (Axillary)  Resp 19  Wt 144 lb 2.9 oz (65.4 kg)  SpO2 98%  HEMODYNAMICS:    VENTILATOR SETTINGS:    INTAKE / OUTPUT: I/O last 3 completed shifts: In: 2778.5 [I.V.:2778.5] Out: 0   PHYSICAL EXAMINATION: General:  Chronically ill appearing female, 4 point restraint in bed. Neuro:  Sedated, Agitated with stimulation. HEENT:  [place]/AT, PERRL Cardiovascular:  RRR, Nl S1/S2, -M/R/G. Lungs: ronchi on the right.  Abdomen:  Diffuse pain.  ND and +BS. Musculoskeletal:  -edema and -tenderness.  Right arm fistula. Skin:  Intact.  LABS:  BMET  Recent Labs Lab 05/27/15 0305 05/28/15 0226 05/29/15 0233  NA 135 134* 131*  K 4.0 4.2 4.6  CL 96* 95* 93*  CO2 28 25 25   BUN 15 21* 25*  CREATININE 5.93* 7.44* 9.14*  GLUCOSE 122* 107* 110*    Electrolytes  Recent Labs Lab 05/25/15 0300  05/27/15 0305 05/28/15 0226 05/29/15 0233  CALCIUM 9.8  < > 8.6* 8.6* 8.6*  MG 2.2  --   --   --   --   PHOS 7.9*  < > 6.4* 7.5* 8.7*  < > = values in this interval not displayed.  CBC  Recent Labs Lab 05/25/15 0300 05/26/15 0900 05/27/15 0305  WBC 5.0 4.1 4.0   HGB 8.9* 8.0* 9.4*  HCT 27.8* 25.0* 30.5*  PLT 233 227 235    Coag's No results for input(s): APTT, INR in the last 168 hours.  Sepsis Markers No results for input(s): LATICACIDVEN, PROCALCITON, O2SATVEN in the last 168 hours.  ABG No results for input(s): PHART, PCO2ART, PO2ART in the last 168 hours.  Liver Enzymes  Recent Labs Lab 05/27/15 0305 05/28/15 0226 05/29/15 0233  ALBUMIN 2.0* 2.0* 1.8*    Cardiac Enzymes No results for input(s): TROPONINI, PROBNP in the last 168 hours.  Glucose  Recent Labs Lab 05/28/15 0354 05/28/15 0807 05/28/15 1158 05/28/15 1526 05/28/15 2007 05/29/15 0404  GLUCAP 92 94 86 98 105* 100*    Imaging No results found.   STUDIES:  None  CULTURES: None  ANTIBIOTICS: None  SIGNIFICANT EVENTS: 2/16 transfer to the ICU for sedation for dialysis., safely got ativan   LINES/TUBES: Right sided fistula.  DISCUSSION: 35 year old female multiple psych disorders, combative, committed and refusing dialysis.  ASSESSMENT / PLAN:  PULMONARY A: BL infiltrates ? Aspiration pna vs edema Protecting airway thus far P:   Persistent Infiltrate Right Mid Lung - Consider treating as HCAP if fever, or worsening VS.  Get CBC today.   CARDIOVASCULAR A: Cardiomyopathy - EF 40% in 08/2014 Sinus tach - agitated. P:  Tele  RENAL A:   ESRD-HD P:   HD  Per renal Pt. Refusing multiple medications  Ongoing difficult management.    GASTROINTESTINAL A:    malnutrition P:   Renal diet   HEMATOLOGIC A:   Did not allow blood draws. P:  CBC post dialysis.  INFECTIOUS A:   No active infection. P:   Monitor. Consider Treating HCAP.  CBC today. Rhonchi on the right. ? Volume   ENDOCRINE A:   Hypoglycemia  P:   D10 while npo   NEUROLOGIC A:   Schizophrenia P:   Haldol allergy, not giving Ativan IV + zyprexa IM q 8h - unable to get these meds due to violent behaviour precedex gtt - attempt to taper as able.  Psych  medications per psych MD - Risperdal depo started yesterday.  Psych attempting inpatient placement at [facility] / another facility.  Appreciate help from Psych team.    Summary - agree that long term HD would require better control of her psychosis - likely with long acting depot meds Appreciate psych input.  Perhaps institutionalise long term ? Need mother input if she lacks capacity Overall care is limited by her behavioral issues   Paula Compton, MD Resident PGY 2

## 2015-05-29 NOTE — Progress Notes (Signed)
Nephrologist notified of decreased BP, currently 94/67, outside of HD parameter orders of [id number] systolic.  Verbal order for albumin confirmed.  Will continue to monitor.

## 2015-05-29 NOTE — Progress Notes (Signed)
[facility] Progress Note  Subjective:    Remains on precedex - at 1.7, up from yesterday, has been aggressive overnight per staff notes  Objective Filed Vitals:   05/29/15 0600 05/29/15 0700 05/29/15 0800 05/29/15 0858  BP: 117/93 113/78 114/77 104/79  Pulse: 61 60 63 39  Temp:      TempSrc:      Resp: 20 19 25 17   Weight:      SpO2: 95% 98% 98% 88%   Physical Exam BP 104/79 mmHg  Pulse 39  Temp(Src) 96.3 F (35.7 C) (Axillary)  Resp 17  Wt 65.4 kg (144 lb 2.9 oz)  SpO2 88% Gen: awakens and will converse, slurred speech prob from Precedex Remains in 4 point restraints Dry crusty mucus membranes Heart: RRR no mrg S1S2 No S3 HR 80's sinus on tele Chest clear anteriorly despite loose cough Abd: soft Ext: bilat UE edema 2+, no other edema RUA AVF + bruit  Dialysis Orders: no center  Old orders from Feb 2016 [place] > 4 hrs59kg 2K/2CaHeparin 6000AVF RUA  CXR 2/7 + pulm edema CXR 2/18 +pulm edema CXR 2/20 improved edema, mostly clear; very large heart shadow  Assessment/Plan: 1 Bipolar/schizophrenia - MAJOR behavioral issues. Long-term psych problems, multiple admissions to psych hospitals in the past [facility], [place], [place] several times).  Hx of HI/SI in the past. Psychiatry following. Getting IM Zyprexa tid for the last 72 hrs. Psych team considering a depot-type medication 2 ESRD - had HD x 3 here so for. BUN/ Cr down to baseline. Has been dismissed from outpatient dialysis practice here in [place] due to behavior.  Not a good long-term solution in sight. 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 Received dose on 2/15. As we are trying to get on a regular TTS schedule will change to QThurs. Getting ferrlecit in HD.   Tsat 123456 - for  ferrlicit [id number] x 4 in HD only. Has had 2 doses - give 2 more. Hb has improved from 7's to 9.4 today 5 CKD-MBD -  no idea about recent PTH status. Phos 11-13. Will take advantage of current admission to check these things and get her on some binders if she will take them. Start Renvela with meals 2 TID ac when she will take po - not on VDRA- check iPTH which is likely to be quite elevated (was 880 in 09/2014) (still pending) 6 HTN/ volume - BP low from sedating medications. Up 5-6kg from "dry weight" which is likely around 60k 7 DM - per primary; no BS  Kelly Splinter MD [facility] pager [phone]    cell [phone] 05/28/2015, 11:40 AM     Additional Objective Labs: Basic Metabolic Panel:  Recent Labs Lab 05/27/15 0305 05/28/15 0226 05/29/15 0233  NA 135 134* 131*  K 4.0 4.2 4.6  CL 96* 95* 93*  CO2 28 25 25   GLUCOSE 122* 107* 110*  BUN 15 21* 25*  CREATININE 5.93* 7.44* 9.14*  CALCIUM 8.6* 8.6* 8.6*  PHOS 6.4* 7.5* 8.7*   Liver Function Tests:  Recent Labs Lab 05/27/15 0305 05/28/15 0226 05/29/15 0233  ALBUMIN 2.0* 2.0* 1.8*   CBC:  Recent Labs Lab 05/22/15 1849 05/23/15 0714 05/24/15 1400 05/25/15 0300 05/26/15 0900 05/27/15 0305  WBC 5.0 6.3 4.7 5.0 4.1 4.0  NEUTROABS 3.0  --   --   --   --   --  HGB 7.9* 7.1* 7.5* 8.9* 8.0* 9.4*  HCT 24.0* 21.8* 23.4* 27.8* 25.0* 30.5*  MCV 82.2 80.1 81.0 84.2 83.6 84.0  PLT 232 250 196 233 227 235   Infusions . dextrose 50 mL/hr at 05/29/15 0841   . [START ON 05/31/2015] darbepoetin (ARANESP) injection - DIALYSIS  200 mcg Intravenous Q Thu-HD  . ferric gluconate (FERRLECIT/NULECIT) IV  250 mg Intravenous Q T,Th,Sa-HD  . folic acid  1 mg Oral Daily  . [START ON 05/30/2015] heparin  4,000 Units Dialysis Once in dialysis  . heparin  5,000 Units Subcutaneous 3 times per day  . insulin aspart  0-9 Units Subcutaneous 6 times per day  . insulin aspart  10 Units Intravenous Once  . metoprolol  5 mg Intravenous 4 times per day  . multivitamin with minerals  1 tablet Oral Daily  . OLANZapine  5 mg Intramuscular Q8H  .  risperiDONE microspheres  25 mg Intramuscular Q14 Days  . sevelamer carbonate  1,600 mg Oral TID WC  . sodium chloride flush  3 mL Intravenous Q12H  . thiamine  100 mg Oral Daily   Or  . thiamine  100 mg Intravenous Daily  . triamcinolone ointment   Topical BID

## 2015-05-29 NOTE — Care Management Note (Signed)
Case Management Note  Patient Details  Name: Sara [name] MRN: [id number] Date of Birth: 03/03/1981  Subjective/Objective:        CM following for progression and d/c planning.            Action/Plan: 05/29/2015 Pt remains in ICU on IV precedex, IM Ativan, IM Risperadone.  Sitter at beside, 4 point restraint and remains combative.  Pt is receiving HD in house  05/23/2015 Noted consult re pt need of HD center. Please note that pt has been d/c from all local HD centers due to behaviors and currently comes to ED for acute HD needs. Clearly pt needs placement in a safe environment where her medical needs can be met. As she has been restricted from local HD centers any placement of this pt would have to take place out of the [place] area. Will discuss with CSW.   Expected Discharge Date:                  Expected Discharge Plan:  [place] vs Group Home vs institutionalization.   In-House Referral:  Clinical Social Work  Ship broker  CM Consult  Post Acute Care Choice:  NA Choice offered to:  NA  DME Arranged:    DME Agency:     HH Arranged:    HH Agency:     Status of Service:  In process, will continue to follow  Medicare Important Message Given:  Yes Date Medicare IM Given:    Medicare IM give by:    Date Additional Medicare IM Given:    Additional Medicare Important Message give by:     If discussed at [place] of Stay Meetings, dates discussed:    05/29/15  Additional Comments:  Maryclare Labrador, RN 05/29/2015, 10:04 AM

## 2015-05-30 LAB — GLUCOSE, CAPILLARY
GLUCOSE-CAPILLARY: 116 mg/dL — AB (ref 65–99)
GLUCOSE-CAPILLARY: 65 mg/dL (ref 65–99)
GLUCOSE-CAPILLARY: 66 mg/dL (ref 65–99)
GLUCOSE-CAPILLARY: 76 mg/dL (ref 65–99)
GLUCOSE-CAPILLARY: 96 mg/dL (ref 65–99)
Glucose-Capillary: 46 mg/dL — ABNORMAL LOW (ref 65–99)
Glucose-Capillary: 49 mg/dL — ABNORMAL LOW (ref 65–99)
Glucose-Capillary: 65 mg/dL (ref 65–99)

## 2015-05-30 LAB — RENAL FUNCTION PANEL
ALBUMIN: 1.9 g/dL — AB (ref 3.5–5.0)
Anion gap: 16 — ABNORMAL HIGH (ref 5–15)
BUN: 9 mg/dL (ref 6–20)
CO2: 25 mmol/L (ref 22–32)
Calcium: 8.5 mg/dL — ABNORMAL LOW (ref 8.9–10.3)
Chloride: 94 mmol/L — ABNORMAL LOW (ref 101–111)
Creatinine, Ser: 5.99 mg/dL — ABNORMAL HIGH (ref 0.44–1.00)
GFR calc Af Amer: 10 mL/min — ABNORMAL LOW (ref >60)
GFR calc non Af Amer: 8 mL/min — ABNORMAL LOW (ref >60)
GLUCOSE: 81 mg/dL (ref 65–99)
PHOSPHORUS: 6.4 mg/dL — AB (ref 2.5–4.6)
POTASSIUM: 4.1 mmol/L (ref 3.5–5.1)
Sodium: 135 mmol/L (ref 135–145)

## 2015-05-30 LAB — CBC
HEMATOCRIT: 31.6 % — AB (ref 36.0–46.0)
Hemoglobin: 9.8 g/dL — ABNORMAL LOW (ref 12.0–15.0)
MCH: 25.9 pg — ABNORMAL LOW (ref 26.0–34.0)
MCHC: 31 g/dL (ref 30.0–36.0)
MCV: 83.6 fL (ref 78.0–100.0)
Platelets: 204 10*3/uL (ref 150–400)
RBC: 3.78 MIL/uL — ABNORMAL LOW (ref 3.87–5.11)
RDW: 17.8 % — AB (ref 11.5–15.5)
WBC: 4.5 10*3/uL (ref 4.0–10.5)

## 2015-05-30 LAB — HEPATITIS B CORE ANTIBODY, TOTAL: HEP B C TOTAL AB: NEGATIVE

## 2015-05-30 LAB — HEPATITIS B SURFACE ANTIGEN: Hepatitis B Surface Ag: NEGATIVE

## 2015-05-30 LAB — HEPATITIS B SURFACE ANTIBODY,QUALITATIVE: HEP B S AB: NONREACTIVE

## 2015-05-30 MED ORDER — OLANZAPINE 5 MG PO TABS
5.0000 mg | ORAL_TABLET | Freq: Three times a day (TID) | ORAL | Status: DC
  Administered 2015-05-30 – 2015-05-31 (×3): 5 mg via ORAL
  Filled 2015-05-30 (×4): qty 1

## 2015-05-30 MED ORDER — METOPROLOL TARTRATE 25 MG PO TABS
25.0000 mg | ORAL_TABLET | Freq: Two times a day (BID) | ORAL | Status: DC
  Administered 2015-05-30 – 2015-05-31 (×2): 25 mg via ORAL
  Filled 2015-05-30 (×2): qty 1

## 2015-05-30 MED ORDER — DEXTROSE 50 % IV SOLN
INTRAVENOUS | Status: AC
  Administered 2015-05-30: 04:00:00
  Filled 2015-05-30: qty 50

## 2015-05-30 MED ORDER — DEXTROSE 50 % IV SOLN
1.0000 | Freq: Once | INTRAVENOUS | Status: AC
  Administered 2015-05-29: 50 mL via INTRAVENOUS

## 2015-05-30 MED ORDER — GLUCAGON HCL RDNA (DIAGNOSTIC) 1 MG IJ SOLR
1.0000 mg | Freq: Once | INTRAMUSCULAR | Status: AC
  Administered 2015-05-30: 1 mg via INTRAMUSCULAR

## 2015-05-30 MED ORDER — DEXTROSE 50 % IV SOLN
25.0000 mL | Freq: Once | INTRAVENOUS | Status: AC
  Administered 2015-05-30: 25 mL via INTRAVENOUS
  Filled 2015-05-30: qty 50

## 2015-05-30 MED ORDER — OLANZAPINE 10 MG IM SOLR
5.0000 mg | Freq: Three times a day (TID) | INTRAMUSCULAR | Status: DC
  Filled 2015-05-30 (×4): qty 10

## 2015-05-30 MED ORDER — GLUCAGON HCL RDNA (DIAGNOSTIC) 1 MG IJ SOLR
INTRAMUSCULAR | Status: AC
  Filled 2015-05-30: qty 1

## 2015-05-30 MED ORDER — LORAZEPAM 2 MG/ML IJ SOLN: 2.0000 mg | INTRAMUSCULAR | Status: DC

## 2015-05-30 MED ORDER — GLUCOSE 40 % PO GEL
ORAL | Status: AC
  Filled 2015-05-30: qty 1

## 2015-05-30 MED ORDER — DEXTROSE 50 % IV SOLN
INTRAVENOUS | Status: AC
Start: 2015-05-30 — End: 2015-05-31
  Filled 2015-05-30: qty 50

## 2015-05-30 MED ORDER — TRAMADOL HCL 50 MG PO TABS: 50.0000 mg | ORAL_TABLET | Freq: Three times a day (TID) | ORAL | Status: DC | PRN

## 2015-05-30 MED ORDER — LORAZEPAM 2 MG/ML IJ SOLN
2.0000 mg | INTRAMUSCULAR | Status: DC | PRN
  Administered 2015-05-30 – 2015-05-31 (×2): 2 mg via INTRAMUSCULAR
  Filled 2015-05-30 (×2): qty 1

## 2015-05-30 MED ORDER — GLUCOSE 40 % PO GEL: 1.0000 | Freq: Once | ORAL | Status: DC

## 2015-05-30 MED ORDER — QUETIAPINE FUMARATE ER 200 MG PO TB24
200.0000 mg | ORAL_TABLET | Freq: Every day | ORAL | Status: DC
  Administered 2015-05-31: 200 mg via ORAL
  Filled 2015-05-30 (×2): qty 1

## 2015-05-30 MED ORDER — DEXTROSE 50 % IV SOLN
INTRAVENOUS | Status: AC
  Filled 2015-05-30: qty 50

## 2015-05-30 NOTE — Progress Notes (Signed)
PMT RN Note: pt is in restraints and just received IM Ativan per RN and Dr Paula Compton. Discussion with MD re: goals for PMT consult. He states they are waiting on psych to see pt again. She has been kicked out of inpt and outpt HD and can only be dialyzed in the ICU because she is in 4 point restraints. Patient has no IV access, and discussions are active regarding placement/commitment/facility/home. PMT will follow along and continue to monitor for a situation that could be impacted by PMT. Larina Earthly, RN, BSN, [facility] 05/30/2015 4:27 PM Cell [phone] 8:00-4:00 Monday-Friday Office [phone]

## 2015-05-30 NOTE — Progress Notes (Signed)
Sugar is 66. Graham crackers, apple juice given. MD aware.Wiill recheck blood sugar.

## 2015-05-30 NOTE — Progress Notes (Signed)
PULMONARY / CRITICAL CARE MEDICINE   Name: Sara [name] MRN: [id number] DOB: 02/04/1981    ADMISSION DATE:  05/22/2015 CONSULTATION DATE:  05/24/2015  REFERRING MD:  [name]  CHIEF COMPLAINT:  Combative and refusing dialysis.  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  Involuntarily committed by her mother and psych.  Now very combative and refusing dialysis.  PCCM was called since patient is refusing everything but has to have dialysis.    SUBJECTIVE:  Remains on precedex gtt 1.7 this am Not making much headway.  Getting Ativan, Zyprexa, Risperdal Consta,  Hypoglycemic yesterday, resolved with dextrose.   VITAL SIGNS: BP 95/62 mmHg  Pulse 67  Temp(Src) 97.4 F (36.3 C) (Axillary)  Resp 14  Wt 140 lb 6.9 oz (63.7 kg)  SpO2 100%  HEMODYNAMICS:    VENTILATOR SETTINGS:    INTAKE / OUTPUT: I/O last 3 completed shifts: In: 2910 [I.V.:2790; IV Piggyback:120] Out: 1500 [Other:1500]  PHYSICAL EXAMINATION: General:  Chronically ill appearing female, 4 point restraint in bed. Neuro:  Sedated, Agitated with stimulation. HEENT:  [place]/AT, PERRL Cardiovascular:  RRR, Nl S1/S2, -M/R/G. Lungs: ronchi on the right. Clear on the left.  Abdomen:  Diffuse pain.  ND and +BS. Musculoskeletal:  -edema and -tenderness.  Right arm fistula. Skin:  Intact.  LABS:  BMET  Recent Labs Lab 05/28/15 0226 05/29/15 0233 05/30/15 0340  NA 134* 131* 135  K 4.2 4.6 4.1  CL 95* 93* 94*  CO2 25 25 25   BUN 21* 25* 9  CREATININE 7.44* 9.14* 5.99*  GLUCOSE 107* 110* 81    Electrolytes  Recent Labs Lab 05/25/15 0300  05/28/15 0226 05/29/15 0233 05/30/15 0340  CALCIUM 9.8  < > 8.6* 8.6* 8.5*  MG 2.2  --   --   --   --   PHOS 7.9*  < > 7.5* 8.7* 6.4*  < > = values in this interval not displayed.  CBC  Recent  Labs Lab 05/27/15 0305 05/29/15 0930 05/30/15 0340  WBC 4.0 3.2* 4.5  HGB 9.4* 8.9* 9.8*  HCT 30.5* 28.1* 31.6*  PLT 235 246 204    Coag's No results for input(s): APTT, INR in the last 168 hours.  Sepsis Markers No results for input(s): LATICACIDVEN, PROCALCITON, O2SATVEN in the last 168 hours.  ABG No results for input(s): PHART, PCO2ART, PO2ART in the last 168 hours.  Liver Enzymes  Recent Labs Lab 05/28/15 0226 05/29/15 0233 05/30/15 0340  ALBUMIN 2.0* 1.8* 1.9*    Cardiac Enzymes No results for input(s): TROPONINI, PROBNP in the last 168 hours.  Glucose  Recent Labs Lab 05/29/15 2020 05/29/15 2126 05/29/15 2359 05/30/15 0024 05/30/15 0334 05/30/15 0416  GLUCAP 53* 74 65 116* 66 76    Imaging No results found.   STUDIES:  None  CULTURES: None  ANTIBIOTICS: None  SIGNIFICANT EVENTS: 2/16 transfer to the ICU for sedation for dialysis., safely got ativan   LINES/TUBES: Right sided fistula.  DISCUSSION: 35 year old female multiple psych disorders, combative, committed and refusing dialysis.  ASSESSMENT / PLAN:  PULMONARY A: BL infiltrates ? Aspiration pna vs edema Protecting airway thus far P:   Persistent Infiltrate Right Mid Lung - Consider treating as HCAP if fever, or worsening VS.  CXR as needed.  CBC bi-weekly.   CARDIOVASCULAR A: Cardiomyopathy - EF 40% in  08/2014 Sinus tach - agitated. P:  Tele  RENAL A:   ESRD-HD P:   HD  Per renal Pt. Refusing multiple medications  Ongoing difficult management.    GASTROINTESTINAL A:    malnutrition P:   Renal diet   HEMATOLOGIC A:   Did not allow blood draws. P:  CBC post dialysis.  INFECTIOUS A:   No active infection. P:   Monitor. Consider Treating HCAP.  CBC today. Rhonchi on the right. ? Volume   ENDOCRINE A:   Hypoglycemia  P:   D10 while npo  Dextrose as needed for hypoglycemia.   NEUROLOGIC A:   Schizophrenia P:   Haldol allergy, not  giving Ativan IV + zyprexa IM q 8h  risperdal consta given.  NO Improvement.  precedex gtt - unable to taper.   Need to discuss with psych again. Making little headway.  Pt. Deemed to be a candidate for inpatient treatment at [facility], but waiting for medical stability i.e. Coming off of precedex. Need to adjust medication regimen.  Appreciate help from Psych team.    Summary - agree that long term HD would require better control of her psychosis - likely with long acting depot meds Appreciate psych input.  Needs inpatient psych placement.  Overall care is limited by her behavioral issues   Paula Compton, MD Resident PGY 2

## 2015-05-30 NOTE — Progress Notes (Signed)
Blood sugar rechecked. 35s. Education given.Refusing Dextr. Gel. Agreed to Glucagon IM. Given. Blood sugar rechecked 49. Pt refusing IV placement. Education was given about consequenses of Blood sugar, including coma, brain damage and death. Pt refused. Agreed to eat some ice cream. MD (black box) called and notified. Mother called 17:10 and asked to come / or to talk to the pt about importance of IV placement. Mother spoke to the pt on the phone. Pt refused IV placement or sugar check. After eating ice cream, pt started to throw up. She was washed up, still refused sugar check, stated: I will do it in couple of hours. No symptoms of hypoglycemia noted, alert, verbally and physically abusive to stuff. MD aware.

## 2015-05-30 NOTE — Progress Notes (Signed)
Patient hypoglycemic. Refusing IV. Refusing glucose oral gel. Refusing IM glucagon. Patient verbalizes understanding that refusing treatment can result in problems up to and including death.

## 2015-05-30 NOTE — Progress Notes (Addendum)
[name] Progress Note  Subjective:   Off o precedex.     Objective Filed Vitals:   05/30/15 1000 05/30/15 1100 05/30/15 1200 05/30/15 1300  BP: 101/77 101/76 105/68 99/74  Pulse: 64   61  Temp:      TempSrc:      Resp: 15 13 16 22   Weight:      SpO2: 99%   92%   Physical Exam BP 99/74 mmHg  Pulse 61  Temp(Src) 97.5 F (36.4 C) (Axillary)  Resp 22  Wt 63.7 kg (140 lb 6.9 oz)  SpO2 92% General: more alert today Remains in 4 point restraints Dry crusty mucus membranes Heart: RRR no mrg S1S2 No S3 HR 80's sinus on tele Chest R base bronchial BS, L mostly clear Abdomen: soft ntnd no ascites Ext: 2+ edema bilat UE's Access:  Right Upper AVF very aneurysmal - patent - + bruit Neuro is alert  Dialysis Orders: no center  Old orders from Feb 2016 Sara [name] > 4 hrs59kg 2K/2CaHeparin 6000AVF RUA   Assessment/Plan: 1 Chronic schizophrenia w bipolar features - have d/w my colleagues. Severe behavior issues.  Patient will no longer be accepted for inpatient dialysis here at [facility] due to aggressive behavior and risk of harm to staff. Will continue to dialyze for now in ICU setting w restraints/ security available.  Have explained this to Sara [name] and questions answered. Suggest request admission to psychiatry facility for long-term care. Patient states she can "go to [place]" for dialysis. Hospice care is another option. Will ask palliative care team to consult.  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 Received dose on 2/15. As we are trying to get on a regular TTS schedule will change to QThurs. Getting ferrlecit in HD.   Tsat 123456 - for  ferrlicit [id number] x 4 in HD only. Has had 2 doses - give 2 more. 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 Phos 11-13. Will take advantage of current admission to check these things and  get her on some binders if she will take them. Start Renvela with meals 2 TID ac when she will take po - not on VDRA- check iPTH which is likely to be quite elevated (was 880 in 09/2014) 6 HTN/ volume - up 4-6 kg. BP's soft, will dc IV scheduled MTP 7 DM - per primary; no BS  Kelly Splinter MD [facility] pager [phone]    cell [phone] 05/28/2015, 11:40 AM     Additional Objective Labs: Basic Metabolic Panel:  Recent Labs Lab 05/28/15 0226 05/29/15 0233 05/30/15 0340  NA 134* 131* 135  K 4.2 4.6 4.1  CL 95* 93* 94*  CO2 25 25 25   GLUCOSE 107* 110* 81  BUN 21* 25* 9  CREATININE 7.44* 9.14* 5.99*  CALCIUM 8.6* 8.6* 8.5*  PHOS 7.5* 8.7* 6.4*   Liver Function Tests:  Recent Labs Lab 05/28/15 0226 05/29/15 0233 05/30/15 0340  ALBUMIN 2.0* 1.8* 1.9*   CBC:  Recent Labs Lab 05/25/15 0300 05/26/15 0900 05/27/15 0305 05/29/15 0930 05/30/15 0340  WBC 5.0 4.1 4.0 3.2* 4.5  HGB 8.9* 8.0* 9.4* 8.9* 9.8*  HCT 27.8* 25.0* 30.5* 28.1* 31.6*  MCV 84.2 83.6 84.0 81.9 83.6  PLT 233 227 235 246 204   Infusions . dexmedetomidine Stopped (05/30/15 1011)  .  dextrose Stopped (05/30/15 1200)   . [START ON 05/31/2015] darbepoetin (ARANESP) injection - DIALYSIS  200 mcg Intravenous Q Thu-HD  . dextrose  1 Tube Oral Once  . dextrose      . ferric gluconate (FERRLECIT/NULECIT) IV  250 mg Intravenous Q T,Th,Sa-HD  . folic acid  1 mg Oral Daily  . heparin  4,000 Units Dialysis Once in dialysis  . heparin  5,000 Units Subcutaneous 3 times per day  . insulin aspart  0-9 Units Subcutaneous 6 times per day  . insulin aspart  10 Units Intravenous Once  . metoprolol  5 mg Intravenous 4 times per day  . multivitamin with minerals  1 tablet Oral Daily  . OLANZapine  5 mg Intramuscular Q8H  . QUEtiapine  200 mg Oral Daily  . risperiDONE microspheres  25 mg Intramuscular Q14 Days  . sevelamer carbonate  1,600 mg Oral TID WC  . sodium chloride flush  3 mL Intravenous  Q12H  . thiamine  100 mg Oral Daily   Or  . thiamine  100 mg Intravenous Daily  . triamcinolone ointment   Topical BID

## 2015-05-30 NOTE — Progress Notes (Signed)
Restraint order given verbally for the past 24 hours by CCM MD, order not placed electronically. Patient remained restrained per verbal order.

## 2015-05-30 NOTE — Progress Notes (Addendum)
[facility] Progress Note Patient Name: Sara [name] DOB: 01-Mar-1981 MRN: [id number]   Date of Service  05/30/2015  HPI/Events of Note  Hypoglycemia - Patient was on D10W at 50 mL/hour. IV infiltrated. Now hypoglycemic with blood glucose = 96 >> 46 >> 65 >> 49. She needs her D10W IV infusion restarted. However, she is refusing to let the nursing staff restart the IV.   eICU Interventions  Bedside nurse instructed to restart the IV and D10W. Suggested that she enlist the help of the patient's family to talk the patient into the IV restart. Complications of hypoglycemia include, but are not limited to, LOC, seizure, brain damage and death should the patient continue to refuse prudent medical therapy.      Intervention Category Major Interventions: Other:  Lysle Dingwall 05/30/2015, 5:07 PM

## 2015-05-30 NOTE — Progress Notes (Signed)
Pt's sugar 46. Loss of IV due to infiltration. Pt takes PO. Orange juice, graham crackers taken. Refused recheck sugar at this time. MD aware. Alert and agitated, no signs of hypoglycemia noted

## 2015-05-30 NOTE — Progress Notes (Signed)
Patient refusing blood pressure cuff.

## 2015-05-31 LAB — GLUCOSE, CAPILLARY
GLUCOSE-CAPILLARY: 35 mg/dL — AB (ref 65–99)
GLUCOSE-CAPILLARY: 84 mg/dL (ref 65–99)
Glucose-Capillary: 85 mg/dL (ref 65–99)

## 2015-05-31 MED ORDER — HALOPERIDOL LACTATE 5 MG/ML IJ SOLN: 1.0000 mg | INTRAMUSCULAR | Status: DC | PRN

## 2015-05-31 MED ORDER — HALOPERIDOL LACTATE 5 MG/ML IJ SOLN
INTRAMUSCULAR | Status: AC
  Administered 2015-05-31: 4 mg
  Filled 2015-05-31: qty 1

## 2015-05-31 NOTE — Progress Notes (Signed)
Patient had been appropriate with staff, calm, and following directions, so wrist restraints were removed. Patient remained appropriate for several hours until suddenly she removed foot restraints and began walking around the room demanding to leave. Patient was assisted to chair until security arrived who assisted patient back to bed. 4 point restraints reapplied and patient medicated per eMAR.

## 2015-05-31 NOTE — Progress Notes (Signed)
Treatment ended goal not met,  venous line clotted, able to return blood successfully. When ready to restart patient refused requesting to "go outside, get fresh air and smoke a cigarette". Security notified, floor nurse and sitter at bedside. Per MD [place] do not resume treatment.

## 2015-05-31 NOTE — Progress Notes (Addendum)
[facility] Progress Note Patient Name: Sara [name] DOB: [birth year] MRN: [id number]   Date of Service  05/31/2015  HPI/Events of Note  Agitation.   eICU Interventions  Haldol PRN. Although listed as allergy she has tolerated it in past. Follow qTC     Intervention Category Minor Interventions: Agitation / anxiety - evaluation and management  Antavia Tandy 05/31/2015, 2:44 AM

## 2015-05-31 NOTE — Progress Notes (Signed)
Patient again refusing CBG test. Informed of risks of hypoglycemia and still refusing.

## 2015-05-31 NOTE — Progress Notes (Signed)
Patient refusing EKG unable to check qTC

## 2015-05-31 NOTE — Discharge Summary (Signed)
Pulmonary and [facility] Discharge Summary  Patient name: Sara [name] Medical record number: [id number] Date of birth: 20-Mar-1981 Age: 35 y.o. Gender: female Date of Admission: 05/22/2015  Date of Discharge: 05/31/2015 Admitting Physician: Norval Morton, MD  Primary Care Provider: No primary care provider on file. Consultants: Psychiatry.   Indication for Hospitalization: Need for Dialysis, Psychosis  Discharge Diagnoses/Problem List:  Past Medical History  Diagnosis Date  . Lupus ([place])     lupus w nephritis  . History of blood transfusion     "this is probably my 3rd" (10/09/2014)  . ESRD (end stage renal disease) on dialysis [facility])     "[name]; [facility]" (10/09/2014)  . Bipolar disorder ([place])     [name] 10/09/2014  . Schizophrenia ([place])     [name] 10/09/2014  . Chronic anemia     [name] 10/09/2014  . CHF (congestive heart failure) ([place])     systolic/notes [id number]  . Acute myopericarditis     hx/notes 10/09/2014  . Psychosis   . Hypertension   . Pregnancy   . Low back pain    Disposition: Pt. Left AMA  Discharge Condition: Stable  Discharge Exam:  General: Chronically ill appearing female restraints off this am.  Neuro: Sedated, Agitated with stimulation. HEENT: [place]/AT, PERRL Cardiovascular: RRR, Nl S1/S2, -M/R/G. Lungs: ronchi on the right. Clear on the left.  Abdomen: Diffuse pain. ND and +BS. Musculoskeletal: -edema and -tenderness. Right arm fistula. Skin: Intact  Brief Hospital Course:   35 year old female with history of multiple psych disorders who has lupus induced renal failure who has been here on multiple occassions due to refusing dialysis and non-compliance. Involuntarily committed by her mother and psych. Very combative and refusing dialysis. PCCM was called since patient is refusing everything but has to have dialysis. Placed in the ICU on a precedex drip for sedation in order to give dialysis.   She came off of the precedex  drip, but has required multiple medications in order to control her psychosis. Including Haldol - No allergic reaction to this during this admission.  Seroquel Xr Risperdal Consta Zyprexa Ativan scheduled  These were able to calm her down, but the patient continued to remain agitated, belligerent, and combative. She refused treatment, and has subsequently been deemed to have capacity. Thus, in spite of our medical advice that she stay to receive treatment she has elected to leave AMA. See summary below.    Summary - Long discussion with the entire team this am and with Dr. Lenna Sciara from Psychiatry. The consensus is that Sara [name] certainly needs inpatient Psychiatric treatment, but there are few places able to take her and provide for her medical needs i.e. Dialysis. She has been getting dialysis here, but has been very difficult to manage due to refusal of medications, combativeness, attacking staff, etc. She is deemed to have capacity to make her own decisions, and she currently has no alternative HC POA or Guardian. She is requesting to leave despite our medical advice that she remain to receive treatment. The risks of leaving without treatment were explained to her at length and she continues to refuse medical treatment and desires to leave the facility AMA and knowing / accepting that she would leave AMA. Thus, she has chosen this route. Her IVC was rescinded as she is now with capacity to make this decision and has not given [place] any other reason to continue to keep her here.     Significant Labs and Imaging:  Recent Labs Lab 05/27/15 0305 05/29/15 0930 05/30/15 0340  WBC 4.0 3.2* 4.5  HGB 9.4* 8.9* 9.8*  HCT 30.5* 28.1* 31.6*  PLT 235 246 204    Recent Labs Lab 05/25/15 0300 05/26/15 0900 05/27/15 0305 05/28/15 0226 05/29/15 0233 05/30/15 0340  NA 137 129* 135 134* 131* 135  K 4.3 4.7 4.0 4.2 4.6 4.1  CL 94* 89* 96* 95* 93* 94*  CO2 26 24 28 25 25 25   GLUCOSE 76 88 122* 107*  110* 81  BUN 39* 47* 15 21* 25* 9  CREATININE 7.54* 10.25* 5.93* 7.44* 9.14* 5.99*  CALCIUM 9.8 8.4* 8.6* 8.6* 8.6* 8.5*  MG 2.2  --   --   --   --   --   PHOS 7.9* 9.7* 6.4* 7.5* 8.7* 6.4*  ALBUMIN  --  2.0* 2.0* 2.0* 1.8* 1.9*    Discharge Medications:    Medication List    ASK your doctor about these medications        ALEVE PO  Take 1 tablet by mouth daily as needed. For lower back pain per patient     metoprolol 50 MG tablet  Commonly known as:  LOPRESSOR  Take 1 tablet (50 mg total) by mouth 2 (two) times daily.     traMADol 50 MG tablet  Commonly known as:  ULTRAM  Take 1 tablet (50 mg total) by mouth every 8 (eight) hours as needed for moderate pain or severe pain.     triamcinolone 0.025 % ointment  Commonly known as:  KENALOG  APPLY 1 APPLICATION TOPICALLY 2 (TWO) TIMES DAILY.         Follow-Up Appointments:   Aquilla Hacker, MD 05/31/2015, 12:42 PM PGY-2

## 2015-05-31 NOTE — Progress Notes (Signed)
PULMONARY / CRITICAL CARE MEDICINE   Name: Sara [name] MRN: [id number] DOB: 01-Aug-1980    ADMISSION DATE:  05/22/2015 CONSULTATION DATE:  05/24/2015  REFERRING MD:  [name]  CHIEF COMPLAINT:  Combative and refusing dialysis.  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  Involuntarily committed by her mother and psych.  Now very combative and refusing dialysis.  PCCM was called since patient is refusing everything but has to have dialysis.    SUBJECTIVE:  Pt. Remains agitated, combative, and beligerent.  Security needed to be called overnight.  In the afternoon was biting off her wrist restraints in bed.  Pt. Has removed her IV, thus IV medications unable to be given.  She did get dialysis yesterday.  She remains very challenging to manage.    VITAL SIGNS: BP 136/59 mmHg  Pulse 129  Temp(Src) 97.1 F (36.2 C) (Oral)  Resp 22  Wt 140 lb 6.9 oz (63.7 kg)  SpO2 99%  HEMODYNAMICS:    VENTILATOR SETTINGS:    INTAKE / OUTPUT: I/O last 3 completed shifts: In: 1612.7 [P.O.:360; I.V.:1252.7] Out: 1 [Stool:1]  PHYSICAL EXAMINATION: General:  Chronically ill appearing female restraints off this am.  Neuro:  Sedated, Agitated with stimulation. HEENT:  [place]/AT, PERRL Cardiovascular:  RRR, Nl S1/S2, -M/R/G. Lungs: ronchi on the right. Clear on the left.  Abdomen:  Diffuse pain.  ND and +BS. Musculoskeletal:  -edema and -tenderness.  Right arm fistula. Skin:  Intact.  LABS:  BMET  Recent Labs Lab 05/28/15 0226 05/29/15 0233 05/30/15 0340  NA 134* 131* 135  K 4.2 4.6 4.1  CL 95* 93* 94*  CO2 25 25 25   BUN 21* 25* 9  CREATININE 7.44* 9.14* 5.99*  GLUCOSE 107* 110* 81    Electrolytes  Recent Labs Lab 05/25/15 0300  05/28/15 0226 05/29/15 0233 05/30/15 0340  CALCIUM 9.8  < > 8.6* 8.6* 8.5*  MG  2.2  --   --   --   --   PHOS 7.9*  < > 7.5* 8.7* 6.4*  < > = values in this interval not displayed.  CBC  Recent Labs Lab 05/27/15 0305 05/29/15 0930 05/30/15 0340  WBC 4.0 3.2* 4.5  HGB 9.4* 8.9* 9.8*  HCT 30.5* 28.1* 31.6*  PLT 235 246 204    Coag's No results for input(s): APTT, INR in the last 168 hours.  Sepsis Markers No results for input(s): LATICACIDVEN, PROCALCITON, O2SATVEN in the last 168 hours.  ABG No results for input(s): PHART, PCO2ART, PO2ART in the last 168 hours.  Liver Enzymes  Recent Labs Lab 05/28/15 0226 05/29/15 0233 05/30/15 0340  ALBUMIN 2.0* 1.8* 1.9*    Cardiac Enzymes No results for input(s): TROPONINI, PROBNP in the last 168 hours.  Glucose  Recent Labs Lab 05/30/15 0416 05/30/15 0815 05/30/15 1159 05/30/15 1542 05/30/15 1657 05/30/15 2052  GLUCAP 76 96 46* 65 49* 85    Imaging No results found.   STUDIES:  None  CULTURES: None  ANTIBIOTICS: None  SIGNIFICANT EVENTS: 2/16 transfer to the ICU for sedation for dialysis., safely got ativan   LINES/TUBES: Right sided fistula.  DISCUSSION: 35 year old female multiple psych disorders, combative, committed and refusing dialysis.  ASSESSMENT / PLAN:  PULMONARY A: BL infiltrates ? Aspiration pna vs edema Protecting airway thus far P:   Persistent Infiltrate Right  Mid Lung - Consider treating as HCAP if fever, or worsening VS.  CXR as needed.  CBC bi-weekly.   CARDIOVASCULAR A: Cardiomyopathy - EF 40% in 08/2014 Sinus tach - agitated. P:  Tele  RENAL A:   ESRD-HD P:   HD  Per renal given on 2/22 last.  Pt. Refusing multiple medications  Ongoing difficult management.    GASTROINTESTINAL A:    malnutrition P:   Renal diet   HEMATOLOGIC A:   Did not allow blood draws. P:  CBC post dialysis.  INFECTIOUS A:   No active infection. P:   Monitor. Consider Treating HCAP.  CBC today. Rhonchi on the right. ? Volume   ENDOCRINE A:    Hypoglycemia  P:   D10 while npo > unable to give with IV removed.  Dextrose po as needed for hypoglycemia.   NEUROLOGIC A:   Schizophrenia P:   Haldol  Given previously pt. Tolerated > given again overnight.  Ativan IV + zyprexa IM q 8h  risperdal consta given.  Seroquel added.  Qtc - 470. Monitoring.  NO Improvement.  precedex gtt - off.  Pt. Deemed to be a candidate for inpatient treatment at [facility] > unable to take her at this time with medical needs.  Appreciate help from Psych team.    Summary - Long discussion with the entire team this am and with Dr. Lenna Sciara from Psychiatry. The consensus is that Ms. Iredale certainly needs inpatient Psychiatric treatment, but there are few places able to take her and provide for her medical needs i.e. Dialysis. She has been getting dialysis here, but has been very difficult to manage due to refusal of medications, combativeness, attacking staff, etc. She is deemed to have capacity to make her own decisions, and she currently has no alternative HC POA or Guardian. She is requesting to leave despite our medical advice that she remain to receive treatment. The risks of leaving without treatment were explained to her at length and she continues to refuse medical treatment and desires to leave the facility AMA and knowing / accepting that she would leave AMA. Thus, she has chosen this route. Her IVC was rescinded as she is now with capacity to make this decision and has not given [place] any other reason to continue to keep her here.   Paula Compton, MD Resident PGY 2
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
## 2015-05-31 NOTE — Care Management Note (Signed)
Case Management Note  Patient Details  Name: Sara [name] MRN: [id number] Date of Birth: 18-Sep-1980  Subjective/Objective:        CM and CSW following for progression and d/c planning.            Action/Plan: 05/31/2015  Pt signing out AMA.  05/29/15 Pt remains in ICU on IV precedex, IM Ativan, IM Risperadone.  Sitter at beside, 4 point restraint and remains combative.  Pt is receiving HD in house.  Psyche SW following  05/23/2015 Noted consult re pt need of HD center. Please note that pt has been d/c from all local HD centers due to behaviors and currently comes to ED for acute HD needs. Clearly pt needs placement in a safe environment where her medical needs can be met. As she has been restricted from local HD centers any placement of this pt would have to take place out of the [place] area. Will discuss with CSW.   Expected Discharge Date:                  Expected Discharge Plan:  [place] vs Group Home vs institutionalization.   In-House Referral:  Clinical Social Work  Ship broker  CM Consult  Post Acute Care Choice:  NA Choice offered to:  NA  DME Arranged:    DME Agency:     HH Arranged:    HH Agency:     Status of Service:  In process, will continue to follow  Medicare Important Message Given:  Yes Date Medicare IM Given:    Medicare IM give by:    Date Additional Medicare IM Given:    Additional Medicare Important Message give by:     If discussed at [place] of Stay Meetings, dates discussed:    05/29/15, 05/31/15  Additional Comments:  Maryclare Labrador, RN 05/31/2015, 1:31 PM

## 2015-05-31 NOTE — Progress Notes (Signed)
Pt educated on where to go to get her medications and has spoken with the doctor about where to get psychiatric help. Pt verbalized understanding of medications and where she needs to go to get dialysis treatment. Pt signing out AMA.

## 2015-05-31 NOTE — Progress Notes (Signed)
Patient refusing ECG monitoring and pulse oximetry.

## 2015-06-02 ENCOUNTER — Encounter (HOSPITAL_COMMUNITY): Payer: Self-pay

## 2015-06-02 ENCOUNTER — Emergency Department (HOSPITAL_COMMUNITY): Payer: Medicare Other

## 2015-06-02 ENCOUNTER — Non-Acute Institutional Stay (HOSPITAL_COMMUNITY)
Admission: EM | Admit: 2015-06-02 | Discharge: 2015-06-02 | Disposition: A | Discharge status: Home / Self Care | Payer: Medicare Other | Source: Home / Self Care | Attending: Emergency Medicine | Admitting: Emergency Medicine

## 2015-06-02 DIAGNOSIS — N19 Unspecified kidney failure: Secondary | ICD-10-CM | POA: Diagnosis present

## 2015-06-02 DIAGNOSIS — N186 End stage renal disease: Secondary | ICD-10-CM | POA: Insufficient documentation

## 2015-06-02 DIAGNOSIS — I5022 Chronic systolic (congestive) heart failure: Secondary | ICD-10-CM

## 2015-06-02 DIAGNOSIS — I132 Hypertensive heart and chronic kidney disease with heart failure and with stage 5 chronic kidney disease, or end stage renal disease: Secondary | ICD-10-CM | POA: Diagnosis not present

## 2015-06-02 DIAGNOSIS — Z992 Dependence on renal dialysis: Secondary | ICD-10-CM

## 2015-06-02 DIAGNOSIS — I120 Hypertensive chronic kidney disease with stage 5 chronic kidney disease or end stage renal disease: Secondary | ICD-10-CM

## 2015-06-02 DIAGNOSIS — D509 Iron deficiency anemia, unspecified: Secondary | ICD-10-CM | POA: Diagnosis not present

## 2015-06-02 DIAGNOSIS — F17210 Nicotine dependence, cigarettes, uncomplicated: Secondary | ICD-10-CM | POA: Insufficient documentation

## 2015-06-02 DIAGNOSIS — Z79899 Other long term (current) drug therapy: Secondary | ICD-10-CM | POA: Diagnosis not present

## 2015-06-02 DIAGNOSIS — R0602 Shortness of breath: Secondary | ICD-10-CM | POA: Diagnosis not present

## 2015-06-02 DIAGNOSIS — E8889 Other specified metabolic disorders: Secondary | ICD-10-CM | POA: Diagnosis not present

## 2015-06-02 DIAGNOSIS — R05 Cough: Secondary | ICD-10-CM | POA: Insufficient documentation

## 2015-06-02 DIAGNOSIS — M1990 Unspecified osteoarthritis, unspecified site: Secondary | ICD-10-CM | POA: Diagnosis not present

## 2015-06-02 DIAGNOSIS — M329 Systemic lupus erythematosus, unspecified: Secondary | ICD-10-CM

## 2015-06-02 DIAGNOSIS — Z7982 Long term (current) use of aspirin: Secondary | ICD-10-CM | POA: Diagnosis not present

## 2015-06-02 LAB — I-STAT CHEM 8, ED
BUN: 42 mg/dL — ABNORMAL HIGH (ref 6–20)
Calcium, Ion: 1.08 mmol/L — ABNORMAL LOW (ref 1.12–1.23)
Chloride: 99 mmol/L — ABNORMAL LOW (ref 101–111)
Creatinine, Ser: 10.3 mg/dL — ABNORMAL HIGH (ref 0.44–1.00)
GLUCOSE: 66 mg/dL (ref 65–99)
HEMATOCRIT: 26 % — AB (ref 36.0–46.0)
HEMOGLOBIN: 8.8 g/dL — AB (ref 12.0–15.0)
POTASSIUM: 4.5 mmol/L (ref 3.5–5.1)
Sodium: 135 mmol/L (ref 135–145)
TCO2: 26 mmol/L (ref 0–100)

## 2015-06-02 MED ORDER — ACETAMINOPHEN 500 MG PO TABS
500.0000 mg | ORAL_TABLET | Freq: Once | ORAL | Status: AC
  Administered 2015-06-02: 500 mg via ORAL
  Filled 2015-06-02: qty 1
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
## 2015-06-02 NOTE — ED Notes (Signed)
Pt here for her routine dialysis and last dialysis was here on Thursday. Denies pain. She also reports rash to left forearm.

## 2015-06-02 NOTE — ED Notes (Signed)
Security was called to inform pt that she must remain in her room.

## 2015-06-02 NOTE — ED Notes (Signed)
No answer from pt in waiting room 

## 2015-06-02 NOTE — ED Provider Notes (Signed)
Pt waiting to be taken up for dialysis but decided she wanted to leave AMA. Then she changed her mind and decided to stay for dialysis. She then changed her mind again and decided she wants to leave AMA. Dr. Winfred Leeds aware. Will give her discharge paperwork and give her a referral to [facility]. Dr. Naaman Plummer notified. Pt also made aware that she cannot have emergent dialysis on Sunday and will have to come back on Monday.  She is aware of risks of leaving the ER AMA and not going to dialysis.   Delos Haring, PA-C 06/02/15 2159  Orlie Dakin, MD 06/02/15 [phone]

## 2015-06-02 NOTE — ED Notes (Signed)
Pt requested "a ginger ale" which was given.  She refused to take it stating that she said "I asked for ice on the side".  She did not, RN left the drink in the table and left the room.

## 2015-06-02 NOTE — ED Provider Notes (Addendum)
Presents for hemodialysis. Associated symptoms include cough productive of yellowish sputum with slight amount of blood for the past 2 days. No fever. No other associated symptoms on exam speaks in prtagraphs no respiratory distress. Lungs clear to auscultation Chest x-ray viewed by me  Orlie Dakin, MD 06/02/15 [place], MD 06/02/15 2051

## 2015-06-02 NOTE — ED Notes (Signed)
Pt informed RN that she wanted to leave and come back tomorrow to get dialysis.  She also requested to speak to the provider.
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
## 2015-06-02 NOTE — ED Notes (Signed)
Ordered meal tray for pt 

## 2015-06-02 NOTE — ED Notes (Signed)
Pt came out of the room and wanted her meal order changed, according to previous RN, she had agreed to the current order after what she initially ordered was not available.   She became verbally upset and demanded to speak to the supervisor.

## 2015-06-02 NOTE — ED Notes (Signed)
Patient transported to X-ray 

## 2015-06-04 ENCOUNTER — Other Ambulatory Visit: Payer: Self-pay | Admitting: Internal Medicine

## 2015-06-04 ENCOUNTER — Emergency Department (HOSPITAL_COMMUNITY): Payer: Medicare Other

## 2015-06-04 ENCOUNTER — Inpatient Hospital Stay (HOSPITAL_COMMUNITY)
Admission: EM | Admit: 2015-06-04 | Discharge: 2015-06-05 | DRG: 291 | Discharge status: Against Medical Advice | Payer: Medicare Other | Attending: Nephrology | Admitting: Nephrology

## 2015-06-04 ENCOUNTER — Encounter (HOSPITAL_COMMUNITY): Payer: Self-pay | Admitting: *Deleted

## 2015-06-04 DIAGNOSIS — Z79899 Other long term (current) drug therapy: Secondary | ICD-10-CM

## 2015-06-04 DIAGNOSIS — E8889 Other specified metabolic disorders: Secondary | ICD-10-CM | POA: Diagnosis present

## 2015-06-04 DIAGNOSIS — M1990 Unspecified osteoarthritis, unspecified site: Secondary | ICD-10-CM | POA: Diagnosis present

## 2015-06-04 DIAGNOSIS — I5022 Chronic systolic (congestive) heart failure: Secondary | ICD-10-CM | POA: Diagnosis present

## 2015-06-04 DIAGNOSIS — I132 Hypertensive heart and chronic kidney disease with heart failure and with stage 5 chronic kidney disease, or end stage renal disease: Principal | ICD-10-CM | POA: Diagnosis present

## 2015-06-04 DIAGNOSIS — F319 Bipolar disorder, unspecified: Secondary | ICD-10-CM

## 2015-06-04 DIAGNOSIS — D509 Iron deficiency anemia, unspecified: Secondary | ICD-10-CM | POA: Diagnosis present

## 2015-06-04 DIAGNOSIS — F17210 Nicotine dependence, cigarettes, uncomplicated: Secondary | ICD-10-CM | POA: Diagnosis present

## 2015-06-04 DIAGNOSIS — N186 End stage renal disease: Secondary | ICD-10-CM | POA: Diagnosis not present

## 2015-06-04 DIAGNOSIS — E8770 Fluid overload, unspecified: Secondary | ICD-10-CM

## 2015-06-04 DIAGNOSIS — G8929 Other chronic pain: Secondary | ICD-10-CM

## 2015-06-04 DIAGNOSIS — R0609 Other forms of dyspnea: Secondary | ICD-10-CM | POA: Diagnosis not present

## 2015-06-04 DIAGNOSIS — Z992 Dependence on renal dialysis: Secondary | ICD-10-CM | POA: Diagnosis not present

## 2015-06-04 DIAGNOSIS — Z7982 Long term (current) use of aspirin: Secondary | ICD-10-CM | POA: Diagnosis not present

## 2015-06-04 DIAGNOSIS — F209 Schizophrenia, unspecified: Secondary | ICD-10-CM | POA: Diagnosis present

## 2015-06-04 DIAGNOSIS — M329 Systemic lupus erythematosus, unspecified: Secondary | ICD-10-CM

## 2015-06-04 DIAGNOSIS — I120 Hypertensive chronic kidney disease with stage 5 chronic kidney disease or end stage renal disease: Secondary | ICD-10-CM | POA: Diagnosis not present

## 2015-06-04 DIAGNOSIS — R0602 Shortness of breath: Secondary | ICD-10-CM | POA: Diagnosis not present

## 2015-06-04 DIAGNOSIS — J810 Acute pulmonary edema: Secondary | ICD-10-CM | POA: Diagnosis present

## 2015-06-04 LAB — COMPREHENSIVE METABOLIC PANEL
ALT: 14 U/L (ref 14–54)
AST: 26 U/L (ref 15–41)
Albumin: 3.1 g/dL — ABNORMAL LOW (ref 3.5–5.0)
Alkaline Phosphatase: 102 U/L (ref 38–126)
Anion gap: 14 (ref 5–15)
BUN: 74 mg/dL — AB (ref 6–20)
CHLORIDE: 98 mmol/L — AB (ref 101–111)
CO2: 25 mmol/L (ref 22–32)
Calcium: 9 mg/dL (ref 8.9–10.3)
Creatinine, Ser: 12.25 mg/dL — ABNORMAL HIGH (ref 0.44–1.00)
GFR, EST AFRICAN AMERICAN: 4 mL/min — AB (ref >60)
GFR, EST NON AFRICAN AMERICAN: 4 mL/min — AB (ref >60)
Glucose, Bld: 88 mg/dL (ref 65–99)
POTASSIUM: 4.8 mmol/L (ref 3.5–5.1)
Sodium: 137 mmol/L (ref 135–145)
Total Bilirubin: 0.4 mg/dL (ref 0.3–1.2)
Total Protein: 7.9 g/dL (ref 6.5–8.1)

## 2015-06-04 LAB — CBC WITH DIFFERENTIAL/PLATELET
BASOS ABS: 0 10*3/uL (ref 0.0–0.1)
BASOS PCT: 0 %
EOS PCT: 4 %
Eosinophils Absolute: 0.2 10*3/uL (ref 0.0–0.7)
HCT: 28 % — ABNORMAL LOW (ref 36.0–46.0)
Hemoglobin: 8.8 g/dL — ABNORMAL LOW (ref 12.0–15.0)
LYMPHS PCT: 20 %
Lymphs Abs: 1.2 10*3/uL (ref 0.7–4.0)
MCH: 26.8 pg (ref 26.0–34.0)
MCHC: 31.4 g/dL (ref 30.0–36.0)
MCV: 85.4 fL (ref 78.0–100.0)
Monocytes Absolute: 0.5 10*3/uL (ref 0.1–1.0)
Monocytes Relative: 8 %
Neutro Abs: 4.1 10*3/uL (ref 1.7–7.7)
Neutrophils Relative %: 68 %
PLATELETS: 248 10*3/uL (ref 150–400)
RBC: 3.28 MIL/uL — AB (ref 3.87–5.11)
RDW: 18.8 % — ABNORMAL HIGH (ref 11.5–15.5)
WBC: 6.1 10*3/uL (ref 4.0–10.5)

## 2015-06-04 LAB — MRSA PCR SCREENING: MRSA BY PCR: NEGATIVE

## 2015-06-04 MED ORDER — SODIUM CHLORIDE 0.9 % IV SOLN: 100.0000 mL | INTRAVENOUS | Status: DC | PRN

## 2015-06-04 MED ORDER — METOPROLOL TARTRATE 50 MG PO TABS: 50.0000 mg | ORAL_TABLET | Freq: Two times a day (BID) | ORAL | Status: DC

## 2015-06-04 MED ORDER — DIPHENHYDRAMINE HCL 25 MG PO CAPS: 25.0000 mg | ORAL_CAPSULE | Freq: Four times a day (QID) | ORAL | Status: DC | PRN

## 2015-06-04 MED ORDER — ONDANSETRON HCL 4 MG/2ML IJ SOLN: 4.0000 mg | Freq: Four times a day (QID) | INTRAMUSCULAR | Status: DC | PRN

## 2015-06-04 MED ORDER — TRAMADOL HCL 50 MG PO TABS
50.0000 mg | ORAL_TABLET | Freq: Three times a day (TID) | ORAL | Status: DC | PRN
  Administered 2015-06-04: 50 mg via ORAL
  Filled 2015-06-04: qty 1

## 2015-06-04 MED ORDER — TRIAMCINOLONE ACETONIDE 0.025 % EX OINT
TOPICAL_OINTMENT | Freq: Two times a day (BID) | CUTANEOUS | Status: DC
  Filled 2015-06-04: qty 80

## 2015-06-04 MED ORDER — HEPARIN SODIUM (PORCINE) 5000 UNIT/ML IJ SOLN: 5000.0000 [IU] | Freq: Two times a day (BID) | INTRAMUSCULAR | Status: DC

## 2015-06-04 MED ORDER — PENTAFLUOROPROP-TETRAFLUOROETH EX AERO: 1.0000 "application " | INHALATION_SPRAY | CUTANEOUS | Status: DC | PRN

## 2015-06-04 MED ORDER — LIDOCAINE-PRILOCAINE 2.5-2.5 % EX CREA: 1.0000 "application " | TOPICAL_CREAM | CUTANEOUS | Status: DC | PRN

## 2015-06-04 MED ORDER — ACETAMINOPHEN 500 MG PO TABS: 1000.0000 mg | ORAL_TABLET | Freq: Four times a day (QID) | ORAL | Status: DC | PRN

## 2015-06-04 MED ORDER — ENOXAPARIN SODIUM 30 MG/0.3ML ~~LOC~~ SOLN: 30.0000 mg | SUBCUTANEOUS | Status: DC

## 2015-06-04 MED ORDER — ONDANSETRON HCL 4 MG PO TABS: 4.0000 mg | ORAL_TABLET | Freq: Four times a day (QID) | ORAL | Status: DC | PRN

## 2015-06-04 MED ORDER — LIDOCAINE HCL (PF) 1 % IJ SOLN
INTRAMUSCULAR | Status: AC
  Administered 2015-06-04: 5 mL via INTRADERMAL
  Filled 2015-06-04: qty 5

## 2015-06-04 MED ORDER — LIDOCAINE HCL (PF) 1 % IJ SOLN
5.0000 mL | INTRAMUSCULAR | Status: DC | PRN
  Administered 2015-06-04: 5 mL via INTRADERMAL

## 2015-06-04 NOTE — Progress Notes (Signed)
Pt removed her telemetry. She states she is leaving AMA. MD made aware, awaiting response.

## 2015-06-04 NOTE — ED Notes (Signed)
Attempted report x1. 

## 2015-06-04 NOTE — Telephone Encounter (Signed)
No refills will be provided.  Patient informed that she was dismissed from [facility] by the medical director Dr. Nori Riis, patient hung up phone.  Derl Barrow, RN

## 2015-06-04 NOTE — Procedures (Signed)
   HEMODIALYSIS TREATMENT NOTE:  4 hour heparin-free dialysis completed via right upper arm AVF (16g/antegrade). Goal met: 4 liters removed without interruption in ultrafiltration. 200cc blood loss due to clotted venous line despite NS flushes q 30 min for circuit patency.  "They usually give me heparin."  HD interrupted x10 min to set up new circuit.  Remaining hour of tx completed without problems. All blood returned and hemostasis achieved within 5 minutes. Pt insisted both needles be removed at same time. Pt stated she intended to leave hospital.  Made arrangements via telephone for transportation. Report given to [place], Therapist, sports.  Rockwell [place], RN, CDN

## 2015-06-04 NOTE — Progress Notes (Signed)
Arrived to floor from ED, Levada Dy from dialysis called for patient to be brought down for dialysis.  Patient wanted to finish eating and then went to bathroom for BM.  Currently down on 2nd floor receiving dialysis.  Pharmacy called regarding Lovenox order being contraindicated for dialysis patient's.  [place] paged, returned call, ordered Lovenox to be DC's and Heparin 5000 units SQ bid be ordered.

## 2015-06-04 NOTE — Telephone Encounter (Signed)
Disregard this msg.  Per Dr. Nori Riis no refills for this patient.  Please close after receipt of this msg.

## 2015-06-04 NOTE — ED Notes (Signed)
Pt comes in because she needs to have dialysis done. Pt explains she gets this done T, Th, and Sat. PT staes she is weighing 20 lb more than her normal. No other symptoms at this time. Pt has care plan in place to consult nephrologist.
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
## 2015-06-04 NOTE — Progress Notes (Signed)
Pt asked nurse to contact MD and states she is leaving AMA.

## 2015-06-04 NOTE — Telephone Encounter (Signed)
Need to have a refill on her medication for Tramadol and prednisone.  Send to [facility] on [address]
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
## 2015-06-04 NOTE — Progress Notes (Signed)
Pt educated on risks of leaving AMA. Pt verbalized understanding. Pt signed AMA form. IV was removed. Telemetry was removed by patient. Pt escorted down by security. MD aware.

## 2015-06-04 NOTE — ED Notes (Signed)
Patient approached Probation officer and asked for a cup of ice in the lobby. Informed patient NPO until seen and approved by ED physician.
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
## 2015-06-04 NOTE — ED Notes (Signed)
Dr. Rancour at bedside updating patient and family.  
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
## 2015-06-08 ENCOUNTER — Encounter (HOSPITAL_COMMUNITY): Payer: Self-pay | Admitting: Emergency Medicine

## 2015-06-08 ENCOUNTER — Emergency Department (HOSPITAL_COMMUNITY)
Admission: EM | Admit: 2015-06-08 | Discharge: 2015-06-08 | Disposition: A | Discharge status: Home / Self Care | Payer: Medicare Other | Attending: Emergency Medicine | Admitting: Emergency Medicine

## 2015-06-08 ENCOUNTER — Emergency Department (HOSPITAL_COMMUNITY): Payer: Medicare Other

## 2015-06-08 DIAGNOSIS — F10120 Alcohol abuse with intoxication, uncomplicated: Secondary | ICD-10-CM | POA: Diagnosis not present

## 2015-06-08 DIAGNOSIS — Z862 Personal history of diseases of the blood and blood-forming organs and certain disorders involving the immune mechanism: Secondary | ICD-10-CM | POA: Diagnosis not present

## 2015-06-08 DIAGNOSIS — R0602 Shortness of breath: Secondary | ICD-10-CM | POA: Insufficient documentation

## 2015-06-08 DIAGNOSIS — R109 Unspecified abdominal pain: Secondary | ICD-10-CM | POA: Diagnosis present

## 2015-06-08 DIAGNOSIS — N186 End stage renal disease: Secondary | ICD-10-CM | POA: Diagnosis not present

## 2015-06-08 DIAGNOSIS — R000 Tachycardia, unspecified: Secondary | ICD-10-CM | POA: Diagnosis not present

## 2015-06-08 DIAGNOSIS — F17210 Nicotine dependence, cigarettes, uncomplicated: Secondary | ICD-10-CM | POA: Insufficient documentation

## 2015-06-08 DIAGNOSIS — Z992 Dependence on renal dialysis: Secondary | ICD-10-CM | POA: Diagnosis not present

## 2015-06-08 DIAGNOSIS — F319 Bipolar disorder, unspecified: Secondary | ICD-10-CM | POA: Insufficient documentation

## 2015-06-08 DIAGNOSIS — Y909 Presence of alcohol in blood, level not specified: Secondary | ICD-10-CM | POA: Diagnosis not present

## 2015-06-08 DIAGNOSIS — F10920 Alcohol use, unspecified with intoxication, uncomplicated: Secondary | ICD-10-CM

## 2015-06-08 DIAGNOSIS — I509 Heart failure, unspecified: Secondary | ICD-10-CM | POA: Insufficient documentation

## 2015-06-08 DIAGNOSIS — I120 Hypertensive chronic kidney disease with stage 5 chronic kidney disease or end stage renal disease: Secondary | ICD-10-CM | POA: Diagnosis not present

## 2015-06-08 DIAGNOSIS — F10129 Alcohol abuse with intoxication, unspecified: Secondary | ICD-10-CM | POA: Diagnosis not present

## 2015-06-08 LAB — I-STAT CHEM 8, ED
BUN: 88 mg/dL — ABNORMAL HIGH (ref 6–20)
CHLORIDE: 103 mmol/L (ref 101–111)
Calcium, Ion: 1.08 mmol/L — ABNORMAL LOW (ref 1.12–1.23)
Creatinine, Ser: 11.6 mg/dL — ABNORMAL HIGH (ref 0.44–1.00)
Glucose, Bld: 85 mg/dL (ref 65–99)
HEMATOCRIT: 24 % — AB (ref 36.0–46.0)
Hemoglobin: 8.2 g/dL — ABNORMAL LOW (ref 12.0–15.0)
POTASSIUM: 5 mmol/L (ref 3.5–5.1)
SODIUM: 140 mmol/L (ref 135–145)
TCO2: 23 mmol/L (ref 0–100)

## 2015-06-08 NOTE — ED Notes (Signed)
Meal Tray delivered. MD advised patient she will not get dialysis today.

## 2015-06-08 NOTE — ED Notes (Addendum)
Patient seen walking outside    RN aware and states she is being d/c home

## 2015-06-08 NOTE — ED Notes (Signed)
Pt brought to ED by [facility], pt states she miss HD last Wed and since then she feels generalize sick and abd pain, pt states she has skin break down and a boil on her vagina. Pt asking for food and drink on arrival to ED.

## 2015-06-08 NOTE — Discharge Instructions (Signed)
If you were given medicines take as directed.  If you are on coumadin or contraceptives realize their levels and effectiveness is altered by many different medicines.  If you have any reaction (rash, tongues swelling, other) to the medicines stop taking and see a physician.    If your blood pressure was elevated in the ER make sure you follow up for management with a primary doctor or return for chest pain, shortness of breath or stroke symptoms.  Please follow up as directed and return to the ER or see a physician for new or worsening symptoms.  Thank you. Filed Vitals:   06/08/15 0328 06/08/15 0345 06/08/15 0430 06/08/15 0515  BP: 154/111 143/98 143/102 162/116  Pulse: 122 107 111 117  Temp: 98.8 F (37.1 C)     TempSrc: Oral     Resp: 19     Height: 5\' 7"  (1.702 m)     Weight: 155 lb (70.308 kg)     SpO2: 100% 97% 96% 97%

## 2015-06-08 NOTE — ED Notes (Signed)
Patient did allow discharge vitals
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
## 2015-06-08 NOTE — ED Notes (Signed)
Breakfast ordered 

## 2015-06-11 ENCOUNTER — Encounter (HOSPITAL_COMMUNITY): Payer: Self-pay | Admitting: Emergency Medicine

## 2015-06-11 ENCOUNTER — Emergency Department (HOSPITAL_COMMUNITY): Payer: Medicare Other

## 2015-06-11 DIAGNOSIS — I132 Hypertensive heart and chronic kidney disease with heart failure and with stage 5 chronic kidney disease, or end stage renal disease: Principal | ICD-10-CM | POA: Diagnosis present

## 2015-06-11 DIAGNOSIS — G894 Chronic pain syndrome: Secondary | ICD-10-CM | POA: Diagnosis present

## 2015-06-11 DIAGNOSIS — M3214 Glomerular disease in systemic lupus erythematosus: Secondary | ICD-10-CM | POA: Diagnosis not present

## 2015-06-11 DIAGNOSIS — Z9115 Patient's noncompliance with renal dialysis: Secondary | ICD-10-CM | POA: Diagnosis not present

## 2015-06-11 DIAGNOSIS — D631 Anemia in chronic kidney disease: Secondary | ICD-10-CM | POA: Diagnosis not present

## 2015-06-11 DIAGNOSIS — E872 Acidosis: Secondary | ICD-10-CM | POA: Diagnosis not present

## 2015-06-11 DIAGNOSIS — S40811A Abrasion of right upper arm, initial encounter: Secondary | ICD-10-CM | POA: Diagnosis not present

## 2015-06-11 DIAGNOSIS — E875 Hyperkalemia: Secondary | ICD-10-CM | POA: Diagnosis not present

## 2015-06-11 DIAGNOSIS — Y929 Unspecified place or not applicable: Secondary | ICD-10-CM

## 2015-06-11 DIAGNOSIS — S80812A Abrasion, left lower leg, initial encounter: Secondary | ICD-10-CM | POA: Diagnosis present

## 2015-06-11 DIAGNOSIS — S80811A Abrasion, right lower leg, initial encounter: Secondary | ICD-10-CM | POA: Diagnosis present

## 2015-06-11 DIAGNOSIS — I120 Hypertensive chronic kidney disease with stage 5 chronic kidney disease or end stage renal disease: Secondary | ICD-10-CM | POA: Diagnosis not present

## 2015-06-11 DIAGNOSIS — N19 Unspecified kidney failure: Secondary | ICD-10-CM | POA: Diagnosis not present

## 2015-06-11 DIAGNOSIS — S40812A Abrasion of left upper arm, initial encounter: Secondary | ICD-10-CM | POA: Diagnosis present

## 2015-06-11 DIAGNOSIS — S0081XA Abrasion of other part of head, initial encounter: Secondary | ICD-10-CM | POA: Diagnosis not present

## 2015-06-11 DIAGNOSIS — Y9389 Activity, other specified: Secondary | ICD-10-CM

## 2015-06-11 DIAGNOSIS — N186 End stage renal disease: Secondary | ICD-10-CM | POA: Diagnosis present

## 2015-06-11 DIAGNOSIS — Z79899 Other long term (current) drug therapy: Secondary | ICD-10-CM

## 2015-06-11 DIAGNOSIS — Z7952 Long term (current) use of systemic steroids: Secondary | ICD-10-CM

## 2015-06-11 DIAGNOSIS — Z7982 Long term (current) use of aspirin: Secondary | ICD-10-CM | POA: Diagnosis not present

## 2015-06-11 DIAGNOSIS — I5023 Acute on chronic systolic (congestive) heart failure: Secondary | ICD-10-CM | POA: Diagnosis not present

## 2015-06-11 DIAGNOSIS — F17210 Nicotine dependence, cigarettes, uncomplicated: Secondary | ICD-10-CM | POA: Diagnosis present

## 2015-06-11 DIAGNOSIS — F17290 Nicotine dependence, other tobacco product, uncomplicated: Secondary | ICD-10-CM | POA: Diagnosis present

## 2015-06-11 DIAGNOSIS — Z992 Dependence on renal dialysis: Secondary | ICD-10-CM

## 2015-06-11 DIAGNOSIS — R0602 Shortness of breath: Secondary | ICD-10-CM | POA: Diagnosis not present

## 2015-06-11 DIAGNOSIS — X58XXXA Exposure to other specified factors, initial encounter: Secondary | ICD-10-CM | POA: Diagnosis present

## 2015-06-11 NOTE — ED Notes (Signed)
Patient states "I need dialysis. I last had dialysis one week ago on Monday and I went to [facility] Friday because I need dialysis but they said my potassium was ok and I didn't need dialysis." Patient complaining of shortness of breath since Thursday.

## 2015-06-11 NOTE — Discharge Summary (Signed)
Physician Discharge Summary  Patient ID: KHILEY CARDULLO MRN: [id number] DOB/AGE: [birth year] 35 y.o.  Admit date: 06/04/2015 Discharge date: 06/11/2015  Admission Diagnoses: 1 ESRD - no OP dialysis center, mild uremia 2 Vol overload - LE edema, lungs clear 3 HTN 4 SLE - on pred but ran out 5 Arthritis/ chron pain - out of tramadol  Discharge Diagnoses:  1 ESRD - no OP dialysis center, mild uremia 2 Vol overload - improved after HD 3 HTN 4 SLE - on pred but ran out 5 Arthritis/ chron pain - out of tramadol  Discharged Condition: good  Hospital Course: Patient has ESRD and no outpatient HD unit, presented w/o dialsyis for several days with leg swelling and facial swelling.  Fatigue, no n/v/d no fevers.  Admitted and seen by Dr. Hinda Lenis for dialysis. Had dialysis and then was dc'd w/o incident or complications.   Consults: nephrology  Significant Diagnostic Studies: none  Treatments: dialysis: Hemodialysis  Discharge Exam: Blood pressure 170/94, pulse 113, temperature 98.1 F (36.7 C), temperature source Oral, resp. rate 16, height 5\' 7"  (1.702 m), weight 70.7 kg (155 lb 13.8 oz), SpO2 95 %. Alert, calm no distress No rash, cyanosis or gangrene Chest clear bilat RRR no mrg ABd soft ntnd no mass or ascites EXt 1+ pitting edema LE's bilat MS no joint effusion/ deformity, normal muscle tone Neuro is alert , Ox 3, nf  Disposition: 01-Home or Self Care     Medication List    TAKE these medications        acetaminophen 500 MG tablet  Commonly known as:  TYLENOL  Take 1,000 mg by mouth every 6 (six) hours as needed for moderate pain.     aspirin 325 MG tablet  Take 325-650 mg by mouth every 6 (six) hours as needed for headache.     metoprolol 50 MG tablet  Commonly known as:  LOPRESSOR  Take 1 tablet (50 mg total) by mouth 2 (two) times daily.     naproxen sodium 220 MG tablet  Commonly known as:  ANAPROX  Take 220 mg by mouth daily as needed (for  pain).     triamcinolone 0.025 % ointment  Commonly known as:  KENALOG  APPLY 1 APPLICATION TOPICALLY 2 (TWO) TIMES DAILY.         SignedRoney Jaffe D 06/11/2015, 11:48 AM

## 2015-06-12 ENCOUNTER — Encounter (HOSPITAL_COMMUNITY): Payer: Self-pay | Admitting: Internal Medicine

## 2015-06-12 ENCOUNTER — Inpatient Hospital Stay (HOSPITAL_COMMUNITY)
Admission: EM | Admit: 2015-06-12 | Discharge: 2015-06-12 | DRG: 291 | Discharge status: Against Medical Advice | Payer: Medicare Other | Attending: Internal Medicine | Admitting: Internal Medicine

## 2015-06-12 DIAGNOSIS — N186 End stage renal disease: Secondary | ICD-10-CM | POA: Diagnosis not present

## 2015-06-12 DIAGNOSIS — F250 Schizoaffective disorder, bipolar type: Secondary | ICD-10-CM | POA: Diagnosis present

## 2015-06-12 DIAGNOSIS — M3214 Glomerular disease in systemic lupus erythematosus: Secondary | ICD-10-CM | POA: Diagnosis present

## 2015-06-12 DIAGNOSIS — E875 Hyperkalemia: Secondary | ICD-10-CM | POA: Diagnosis present

## 2015-06-12 DIAGNOSIS — Z9115 Patient's noncompliance with renal dialysis: Secondary | ICD-10-CM | POA: Diagnosis not present

## 2015-06-12 DIAGNOSIS — F17210 Nicotine dependence, cigarettes, uncomplicated: Secondary | ICD-10-CM | POA: Diagnosis present

## 2015-06-12 DIAGNOSIS — I5023 Acute on chronic systolic (congestive) heart failure: Secondary | ICD-10-CM | POA: Diagnosis not present

## 2015-06-12 DIAGNOSIS — G894 Chronic pain syndrome: Secondary | ICD-10-CM | POA: Diagnosis present

## 2015-06-12 DIAGNOSIS — Z992 Dependence on renal dialysis: Secondary | ICD-10-CM | POA: Diagnosis not present

## 2015-06-12 DIAGNOSIS — E8779 Other fluid overload: Secondary | ICD-10-CM

## 2015-06-12 DIAGNOSIS — T148 Other injury of unspecified body region: Secondary | ICD-10-CM

## 2015-06-12 DIAGNOSIS — Y929 Unspecified place or not applicable: Secondary | ICD-10-CM | POA: Diagnosis not present

## 2015-06-12 DIAGNOSIS — S40811A Abrasion of right upper arm, initial encounter: Secondary | ICD-10-CM | POA: Diagnosis present

## 2015-06-12 DIAGNOSIS — M329 Systemic lupus erythematosus, unspecified: Secondary | ICD-10-CM | POA: Diagnosis present

## 2015-06-12 DIAGNOSIS — X58XXXA Exposure to other specified factors, initial encounter: Secondary | ICD-10-CM | POA: Diagnosis present

## 2015-06-12 DIAGNOSIS — F1910 Other psychoactive substance abuse, uncomplicated: Secondary | ICD-10-CM

## 2015-06-12 DIAGNOSIS — S80812A Abrasion, left lower leg, initial encounter: Secondary | ICD-10-CM | POA: Diagnosis present

## 2015-06-12 DIAGNOSIS — Z79899 Other long term (current) drug therapy: Secondary | ICD-10-CM | POA: Diagnosis not present

## 2015-06-12 DIAGNOSIS — E8770 Fluid overload, unspecified: Secondary | ICD-10-CM

## 2015-06-12 DIAGNOSIS — E872 Acidosis, unspecified: Secondary | ICD-10-CM | POA: Diagnosis present

## 2015-06-12 DIAGNOSIS — Z7982 Long term (current) use of aspirin: Secondary | ICD-10-CM | POA: Diagnosis not present

## 2015-06-12 DIAGNOSIS — F17290 Nicotine dependence, other tobacco product, uncomplicated: Secondary | ICD-10-CM | POA: Diagnosis present

## 2015-06-12 DIAGNOSIS — R0602 Shortness of breath: Secondary | ICD-10-CM | POA: Diagnosis not present

## 2015-06-12 DIAGNOSIS — S0081XA Abrasion of other part of head, initial encounter: Secondary | ICD-10-CM | POA: Diagnosis present

## 2015-06-12 DIAGNOSIS — T148XXA Other injury of unspecified body region, initial encounter: Secondary | ICD-10-CM

## 2015-06-12 DIAGNOSIS — Z7952 Long term (current) use of systemic steroids: Secondary | ICD-10-CM | POA: Diagnosis not present

## 2015-06-12 DIAGNOSIS — I132 Hypertensive heart and chronic kidney disease with heart failure and with stage 5 chronic kidney disease, or end stage renal disease: Secondary | ICD-10-CM | POA: Diagnosis present

## 2015-06-12 DIAGNOSIS — I10 Essential (primary) hypertension: Secondary | ICD-10-CM | POA: Diagnosis not present

## 2015-06-12 DIAGNOSIS — S80811A Abrasion, right lower leg, initial encounter: Secondary | ICD-10-CM | POA: Diagnosis present

## 2015-06-12 DIAGNOSIS — D631 Anemia in chronic kidney disease: Secondary | ICD-10-CM | POA: Diagnosis present

## 2015-06-12 DIAGNOSIS — N19 Unspecified kidney failure: Secondary | ICD-10-CM

## 2015-06-12 DIAGNOSIS — S40812A Abrasion of left upper arm, initial encounter: Secondary | ICD-10-CM | POA: Diagnosis present

## 2015-06-12 DIAGNOSIS — Y9389 Activity, other specified: Secondary | ICD-10-CM | POA: Diagnosis not present

## 2015-06-12 HISTORY — DX: Schizoaffective disorder, bipolar type: F25.0

## 2015-06-12 HISTORY — DX: Tobacco use: Z72.0

## 2015-06-12 HISTORY — DX: Other specified abnormal immunological findings in serum: R76.8

## 2015-06-12 LAB — BASIC METABOLIC PANEL
Anion gap: 22 — ABNORMAL HIGH (ref 5–15)
BUN: 146 mg/dL — AB (ref 6–20)
CO2: 14 mmol/L — AB (ref 22–32)
Calcium: 9.3 mg/dL (ref 8.9–10.3)
Chloride: 103 mmol/L (ref 101–111)
Creatinine, Ser: 15.2 mg/dL — ABNORMAL HIGH (ref 0.44–1.00)
GFR calc Af Amer: 3 mL/min — ABNORMAL LOW (ref >60)
GFR, EST NON AFRICAN AMERICAN: 3 mL/min — AB (ref >60)
GLUCOSE: 79 mg/dL (ref 65–99)
POTASSIUM: 5.4 mmol/L — AB (ref 3.5–5.1)
Sodium: 139 mmol/L (ref 135–145)

## 2015-06-12 MED ORDER — ALUM & MAG HYDROXIDE-SIMETH 200-200-20 MG/5ML PO SUSP: 30.0000 mL | Freq: Four times a day (QID) | ORAL | Status: DC | PRN

## 2015-06-12 MED ORDER — HEPARIN SODIUM (PORCINE) 1000 UNIT/ML IJ SOLN
INTRAMUSCULAR | Status: AC
  Administered 2015-06-12: 1000 [IU]
  Filled 2015-06-12: qty 3

## 2015-06-12 MED ORDER — ACETAMINOPHEN 325 MG PO TABS
650.0000 mg | ORAL_TABLET | Freq: Four times a day (QID) | ORAL | Status: DC | PRN
  Administered 2015-06-12: 650 mg via ORAL
  Filled 2015-06-12: qty 2

## 2015-06-12 MED ORDER — LIDOCAINE HCL (PF) 1 % IJ SOLN
INTRAMUSCULAR | Status: AC
  Administered 2015-06-12: 0.5 mL via INTRADERMAL
  Filled 2015-06-12: qty 5

## 2015-06-12 MED ORDER — TRIAMCINOLONE ACETONIDE 0.025 % EX CREA
TOPICAL_CREAM | Freq: Two times a day (BID) | CUTANEOUS | Status: DC | PRN
  Filled 2015-06-12: qty 15

## 2015-06-12 MED ORDER — DIPHENHYDRAMINE HCL 25 MG PO CAPS
25.0000 mg | ORAL_CAPSULE | Freq: Four times a day (QID) | ORAL | Status: DC | PRN
  Administered 2015-06-12: 25 mg via ORAL

## 2015-06-12 MED ORDER — SODIUM CHLORIDE 0.9 % IV SOLN: 100.0000 mL | INTRAVENOUS | Status: DC | PRN

## 2015-06-12 MED ORDER — LIDOCAINE HCL (PF) 1 % IJ SOLN
5.0000 mL | INTRAMUSCULAR | Status: DC | PRN
  Administered 2015-06-12: 0.5 mL via INTRADERMAL

## 2015-06-12 MED ORDER — LABETALOL HCL 5 MG/ML IV SOLN: 5.0000 mg | INTRAVENOUS | Status: DC | PRN

## 2015-06-12 MED ORDER — PREDNISONE 20 MG PO TABS: 20.0000 mg | ORAL_TABLET | Freq: Every day | ORAL | Status: DC

## 2015-06-12 MED ORDER — ONDANSETRON HCL 4 MG/2ML IJ SOLN
4.0000 mg | Freq: Four times a day (QID) | INTRAMUSCULAR | Status: DC | PRN
Start: 2015-06-12 — End: 2015-06-12
  Administered 2015-06-12: 4 mg via INTRAVENOUS
  Filled 2015-06-12: qty 2

## 2015-06-12 MED ORDER — ACETAMINOPHEN 650 MG RE SUPP: 650.0000 mg | Freq: Four times a day (QID) | RECTAL | Status: DC | PRN

## 2015-06-12 MED ORDER — GUAIFENESIN-DM 100-10 MG/5ML PO SYRP: 5.0000 mL | ORAL_SOLUTION | ORAL | Status: DC | PRN

## 2015-06-12 MED ORDER — DIPHENHYDRAMINE HCL 25 MG PO CAPS
ORAL_CAPSULE | ORAL | Status: AC
  Administered 2015-06-12: 25 mg via ORAL
  Filled 2015-06-12: qty 1

## 2015-06-12 MED ORDER — LABETALOL HCL 5 MG/ML IV SOLN
10.0000 mg | Freq: Once | INTRAVENOUS | Status: AC
  Administered 2015-06-12: 10 mg via INTRAVENOUS
  Filled 2015-06-12: qty 4

## 2015-06-12 MED ORDER — ONDANSETRON HCL 4 MG PO TABS: 4.0000 mg | ORAL_TABLET | Freq: Four times a day (QID) | ORAL | Status: DC | PRN

## 2015-06-12 MED ORDER — EPOETIN ALFA 20000 UNIT/ML IJ SOLN
20000.0000 [IU] | INTRAMUSCULAR | Status: DC
  Administered 2015-06-12: 20000 [IU] via SUBCUTANEOUS
  Filled 2015-06-12: qty 1

## 2015-06-12 MED ORDER — HEPARIN SODIUM (PORCINE) 5000 UNIT/ML IJ SOLN: 5000.0000 [IU] | Freq: Three times a day (TID) | INTRAMUSCULAR | Status: DC

## 2015-06-12 MED ORDER — ACETAMINOPHEN 500 MG PO TABS
1000.0000 mg | ORAL_TABLET | Freq: Once | ORAL | Status: AC
  Administered 2015-06-12: 1000 mg via ORAL
  Filled 2015-06-12: qty 2

## 2015-06-12 MED ORDER — ALBUTEROL SULFATE (2.5 MG/3ML) 0.083% IN NEBU: 2.5000 mg | INHALATION_SOLUTION | RESPIRATORY_TRACT | Status: DC | PRN

## 2015-06-12 MED ORDER — TRAMADOL HCL 50 MG PO TABS
50.0000 mg | ORAL_TABLET | Freq: Every day | ORAL | Status: DC
  Administered 2015-06-12: 50 mg via ORAL
  Filled 2015-06-12: qty 1

## 2015-06-12 MED ORDER — PENTAFLUOROPROP-TETRAFLUOROETH EX AERO: 1.0000 "application " | INHALATION_SPRAY | CUTANEOUS | Status: DC | PRN

## 2015-06-12 MED ORDER — EPOETIN ALFA 20000 UNIT/ML IJ SOLN
INTRAMUSCULAR | Status: AC
  Administered 2015-06-12: 20000 [IU] via SUBCUTANEOUS
  Filled 2015-06-12: qty 1

## 2015-06-12 MED ORDER — METOPROLOL TARTRATE 50 MG PO TABS
50.0000 mg | ORAL_TABLET | Freq: Two times a day (BID) | ORAL | Status: DC
  Administered 2015-06-12: 50 mg via ORAL
  Filled 2015-06-12: qty 1

## 2015-06-12 NOTE — ED Notes (Signed)
Pt given ice water per request.

## 2015-06-12 NOTE — Procedures (Signed)
HEMODIALYSIS TREATMENT NOTE:  4.5 hour low-heparin dialysis completed via right upper arm AVF (16g/antegrade). Goal met: 4 liters removed without interruption in ultrafiltration. All blood was returned and hemostasis was achieved within 15 minutes. Report given to Russ Halo, RN.  Rockwell Alexandria, RN, CDN

## 2015-06-12 NOTE — ED Notes (Signed)
Patient given wash cloths, towel and brief. Patient wanting to wash up at this time. Patient asking for ice chips and water.
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
## 2015-06-12 NOTE — ED Notes (Signed)
Pt given breakfast meal tray. 
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
## 2015-06-12 NOTE — Discharge Summary (Signed)
Physician Discharge Summary  Sara [name] [name] DOB: 09/13/1980 DOA: 06/12/2015  PCP: No primary care provider on file.  Admit date: 06/12/2015 Discharge date: 06/12/2015  Time spent: Less than 30 minutes  Recommendations for Outpatient Follow-up:  1. Patient left AGAINST MEDICAL ADVICE     Discharge Diagnoses:  1. Hypervolemia secondary to end-stage renal disease. -The patient had not been dialyzed in more than one week. 2. Pulmonary edema secondary to acute on chronic systolic congestive heart failure and end-stage renal disease. 3. Malignant hypertension. 4. Systemic lupus with lupus nephritis. 5. Polysubstance abuse. 6. Mild hyperkalemia secondary to end-stage renal disease. 7. Metabolic acidosis secondary to end-stage renal disease. 8. 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 10. Schizoaffective disorder, bipolar type.  Discharge Condition: Left AMA, but appear to be in stable condition  Diet recommendation: Left AMA  Filed Weights   06/11/15 2342 06/12/15 1031  Weight: 70.308 kg (155 lb) 83.3 kg (183 lb 10.3 oz)    History of present illness:  Patient is a 35 y.o. 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 Due to her past behavior, she no longer receives outpatient hemodialysis at any of the local hemodialysis centers. Generally, she presents to the ED at [facility] for hemodialysis and then typically is discharged or she leaves AMA shortly after HD. She presented to the ED at [facility] for 1 week history of shortness of breath and swelling in her abdomen and legs. She was actually seen in the ED at [facility] on 06/04/2015. At that time, she did not get hemodialysis because her lungs were clear and her serum potassium was within normal limits. On admission, she complained of shortness of breath  and swelling in her legs. She reported several loose bowel movements over the past couple days. She had a cough with clear sputum. She denied chest pain, fever, chills, body aches, abdominal pain, nausea, or vomiting. She denied black tarry stools or bright red blood in her stools. In the ED, she was mildly tachycardic and hypertensive with systolic blood pressure in the [id number] and diastolic blood pressure in the 120s. Her chest x-ray revealed chronic cardiomegaly, vascular congestion, and small pleural effusions. Her lab data were significant for serum potassium of 5.4, CO2 of 14, BUN of 146, and creatinine of 15.2. She was admitted for further evaluation and management.  Hospital Course:  Nephrologist, Dr. Lowanda Foster was consulted. He planned HD. She was started on all of her chronic medications, particularly metoprolol for treatment of her malignant hypertension. She was given as needed IV labetalol for accelerated blood pressures. She was continued on prednisone for lupus. The social worker was consulted to assist the patient with polysubstance abuse. She was advised to seek help for her drug use. She was advised to stop smoking. She refused a nicotine patch. Tobacco cessation counseling was ordered.  The patient underwent hemodialysis successfully. Following hemodialysis, she wanted to leave [place]. I tried to encourage her to stay for further hemodialysis. The risk of not staying in the hospital included worsening congestive heart failure and this was explained to her, but she refused to stay. She was alert and oriented at the time of my conversation with her. Her speech was not pressured.   Procedures:  Hemodialysis  Consultations:  Nephrology  Discharge Exam: Filed Vitals:   06/12/15 1515 06/12/15 1545  BP: 164/97 165/104  Pulse: 85 95  Temp:    Resp:      She was alert and oriented 3. Her speech was clear. Cranial nerves II through XII are intact.  Discharge  Instructions    Discharge Medication List as of 06/12/2015  5:00 PM    CONTINUE these medications which have NOT CHANGED   Details  metoprolol (LOPRESSOR) 50 MG tablet Take 1 tablet (50 mg total) by mouth 2 (two) times daily., Starting 01/01/2015, Until Discontinued, Print    predniSONE (DELTASONE) 20 MG tablet Take 20 mg by mouth daily with breakfast., Until Discontinued, Historical Med    traMADol (ULTRAM) 50 MG tablet Take 50 mg by mouth daily., Starting 06/05/2015, Until Discontinued, Historical Med    acetaminophen (TYLENOL) 500 MG tablet Take 1,000 mg by mouth every 6 (six) hours as needed for moderate pain. Reported on 06/12/2015, Until Discontinued, Historical Med    aspirin 325 MG tablet Take 325-650 mg by mouth every 6 (six) hours as needed for headache. Reported on 06/12/2015, Until Discontinued, Historical Med    naproxen sodium (ANAPROX) 220 MG tablet Take 220 mg by mouth daily as needed (for pain). Reported on 06/12/2015, Until Discontinued, Historical Med    triamcinolone (KENALOG) 0.025 % ointment APPLY 1 APPLICATION TOPICALLY 2 (TWO) TIMES DAILY., Normal       Allergies  Allergen Reactions  . Ativan [Lorazepam] Other (See Comments)    Dysarthria(patient has difficulty speaking and slurred speech); denies swelling, itching, pain, or numbness.  [name] [Ziprasidone Hcl] Itching and Swelling    Tongue swelling  . Keflex [Cephalexin] Swelling and Other (See Comments)    Tongue swelling. Can't talk   . Haldol [Haloperidol Lactate] Swelling    Tongue swelling. 05/31/15 - MD ok with giving as pt has tolerated in the past  . Other Itching    wool      The results of significant diagnostics from this hospitalization (including imaging, microbiology, ancillary and laboratory) are listed below for reference.    Significant Diagnostic Studies: Dg Chest 2 View  06/12/2015  CLINICAL DATA:  Shortness of breath for 5 days. EXAM: CHEST  2 VIEW COMPARISON:  Radiographs 06/08/2015,  multiple priors. FINDINGS: Cardiomegaly is stable. Vascular congestion again seen. Small pleural effusions, with minimal fluid in the minor fissure. No confluent airspace disease or pneumothorax. Resorption of both distal clavicle again seen. IMPRESSION: Chronic cardiomegaly, vascular congestion, and small pleural effusions. Electronically Signed   By: Jeb Levering M.D.   On: 06/12/2015 00:07   Dg Chest 2 View  06/04/2015  CLINICAL DATA:  Shortness of breath.  Chronic renal failure. EXAM: CHEST  2 VIEW COMPARISON:  06/02/2015 and 05/27/2015 FINDINGS: There is chronic cardiomegaly. There is slight pulmonary vascular congestion which has improved since the prior study. Tiny bilateral pleural effusions, unchanged. No acute osseous abnormality. Slight thoracolumbar scoliosis. IMPRESSION: Chronic cardiomegaly and pulmonary vascular congestion, slightly improved. Stable tiny effusions. Electronically Signed   By: Lorriane Shire M.D.   On: 06/04/2015 15:38   Dg Chest 2 View  06/02/2015  CLINICAL DATA:  Patient with productive cough for 2 days. EXAM: CHEST  2 VIEW COMPARISON:  Chest radiograph 05/27/2015. FINDINGS: Marked cardiomegaly. Pulmonary vascular redistribution. No large area of pulmonary consolidation. No definite pleural effusion or pneumothorax. Re- demonstrated osteolysis of the distal clavicles. IMPRESSION: Cardiomegaly without acute cardiopulmonary process. Re- demonstrated osteolysis of the distal clavicles. Electronically Signed   By: Lovey Newcomer M.D.   On: 06/02/2015 19:36   Dg Chest Portable  1 View  06/08/2015  CLINICAL DATA:  Acute onset of shortness of breath. Initial encounter. EXAM: PORTABLE CHEST 1 VIEW COMPARISON:  Chest radiograph performed 06/04/2015 FINDINGS: The lungs are well-aerated. Vascular congestion is noted. Mildly increased interstitial markings may reflect mild interstitial edema or possibly pneumonia. There is no evidence of pleural effusion or pneumothorax. The  cardiomediastinal silhouette is enlarged. No acute osseous abnormalities are seen. IMPRESSION: Vascular congestion and cardiomegaly. Mildly increased interstitial markings may reflect mild interstitial edema or possibly pneumonia. Electronically Signed   By: Garald Balding M.D.   On: 06/08/2015 05:11   Dg Chest Port 1 View  05/27/2015  CLINICAL DATA:  Followup patchy opacity in the right mid lung zone. EXAM: PORTABLE CHEST 1 VIEW COMPARISON:  Yesterday. FINDINGS: The previously seen patchy opacity in the right mid lung zone is more consolidated, occupying a smaller volume and denser. No significant change in mild left basilar atelectasis and enlargement of the cardiac silhouette. Minimal right pleural fluid with improvement. Possible minimal left pleural fluid. Lytic lesions in both distal clavicles. IMPRESSION: 1. Persistent airspace opacity in the right mid lung zone with a more atelectatic appearance. 2. Decreased size of a small right pleural effusion. 3. Stable cardiomegaly and left basilar atelectasis. 4. Lytic lesions in both distal clavicles. These could represent metastases or posttraumatic lysis. Electronically Signed   By: Claudie Revering M.D.   On: 05/27/2015 12:24   Dg Chest Port 1 View  05/26/2015  CLINICAL DATA:  35 year old female with atelectasis EXAM: PORTABLE CHEST 1 VIEW COMPARISON:  Prior chest x-ray 05/15/2015 FINDINGS: Stable enlargement of the cardiopericardial silhouette. Persistent pulmonary vascular congestion. Developing nonspecific airspace opacity in the right mid lung. Cyst probable bibasilar atelectasis. No pneumothorax. No acute osseous abnormality. IMPRESSION: 1. Developing nonspecific patchy airspace opacity in the right mid lung may represent pneumonia or asymmetric pulmonary edema. 2. Otherwise, unchanged cardiomegaly and mild pulmonary edema. Electronically Signed   By: Jacqulynn Cadet M.D.   On: 05/26/2015 07:41   Dg Chest Port 1 View  05/16/2015  CLINICAL DATA:   35 year old female with shortness of breath and cough EXAM: PORTABLE CHEST 1 VIEW COMPARISON:  Radiograph dated 04/24/2015 FINDINGS: There is stable cardiomegaly with increased vascular prominence compatible with congestive changes. There has been interval increase in vascular markings compared to prior study. No focal consolidation, pleural effusion, or pneumothorax. The osseous structures appear grossly unremarkable. Bilateral AC joints degenerative changes. IMPRESSION: Cardiomegaly with worsening of the congestive changes. Electronically Signed   By: Anner Crete M.D.   On: 05/16/2015 00:03    Microbiology: Recent Results (from the past 240 hour(s))  MRSA PCR Screening     Status: None   Collection Time: 06/04/15  6:00 PM  Result Value Ref Range Status   MRSA by PCR NEGATIVE NEGATIVE Final    Comment:        The GeneXpert MRSA Assay (FDA approved for NASAL specimens only), is one component of a comprehensive MRSA colonization surveillance program. It is not intended to diagnose MRSA infection nor to guide or monitor treatment for MRSA infections.      Labs: Basic Metabolic Panel:  Recent Labs Lab 06/08/15 0449 06/12/15 0032  NA 140 139  K 5.0 5.4*  CL 103 103  CO2  --  14*  GLUCOSE 85 79  BUN 88* 146*  CREATININE 11.60* 15.20*  CALCIUM  --  9.3   Liver Function Tests: No results for input(s): AST, ALT, ALKPHOS, BILITOT, PROT, ALBUMIN in the last  168 hours. No results for input(s): LIPASE, AMYLASE in the last 168 hours. No results for input(s): AMMONIA in the last 168 hours. CBC:  Recent Labs Lab 06/08/15 0449  HGB 8.2*  HCT 24.0*   Cardiac Enzymes: No results for input(s): CKTOTAL, CKMB, CKMBINDEX, TROPONINI in the last 168 hours. BNP: BNP (last 3 results)  Recent Labs  06/29/14 1439 06/30/14 0116 09/11/14 0815  BNP >4500.0* >4500.0* 1694.9*    ProBNP (last 3 results) No results for input(s): PROBNP in the last 8760 hours.  CBG: No results for  input(s): GLUCAP in the last 168 hours.     Signed:  Aleka Twitty MD.  [facility] 06/12/2015, 6:31 PM
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
## 2015-06-12 NOTE — Progress Notes (Addendum)
Patient left AMA.  Reviewed risks with patient and paper was signed and placed in chart.  MD aware and spoke to patient as well.  Patient still wished to leave, left in stable condition. IV was removed prior to leaving the floor - WNL.

## 2015-06-12 NOTE — ED Notes (Signed)
Patient asking for pain medication at this time. 

## 2015-06-14 ENCOUNTER — Encounter (HOSPITAL_COMMUNITY): Payer: Self-pay | Admitting: Emergency Medicine

## 2015-06-14 ENCOUNTER — Emergency Department (HOSPITAL_COMMUNITY): Payer: Medicare Other

## 2015-06-14 ENCOUNTER — Observation Stay (HOSPITAL_COMMUNITY)
Admission: EM | Admit: 2015-06-14 | Discharge: 2015-06-15 | Disposition: A | Discharge status: Home / Self Care | Payer: Medicare Other | Attending: Internal Medicine | Admitting: Internal Medicine

## 2015-06-14 DIAGNOSIS — Z992 Dependence on renal dialysis: Secondary | ICD-10-CM | POA: Diagnosis not present

## 2015-06-14 DIAGNOSIS — E8770 Fluid overload, unspecified: Secondary | ICD-10-CM | POA: Diagnosis not present

## 2015-06-14 DIAGNOSIS — Z79899 Other long term (current) drug therapy: Secondary | ICD-10-CM | POA: Insufficient documentation

## 2015-06-14 DIAGNOSIS — D631 Anemia in chronic kidney disease: Secondary | ICD-10-CM | POA: Insufficient documentation

## 2015-06-14 DIAGNOSIS — J90 Pleural effusion, not elsewhere classified: Secondary | ICD-10-CM | POA: Diagnosis not present

## 2015-06-14 DIAGNOSIS — F17210 Nicotine dependence, cigarettes, uncomplicated: Secondary | ICD-10-CM | POA: Diagnosis not present

## 2015-06-14 DIAGNOSIS — I151 Hypertension secondary to other renal disorders: Secondary | ICD-10-CM | POA: Diagnosis not present

## 2015-06-14 DIAGNOSIS — N186 End stage renal disease: Secondary | ICD-10-CM

## 2015-06-14 DIAGNOSIS — I132 Hypertensive heart and chronic kidney disease with heart failure and with stage 5 chronic kidney disease, or end stage renal disease: Principal | ICD-10-CM | POA: Insufficient documentation

## 2015-06-14 DIAGNOSIS — I509 Heart failure, unspecified: Secondary | ICD-10-CM | POA: Insufficient documentation

## 2015-06-14 DIAGNOSIS — D649 Anemia, unspecified: Secondary | ICD-10-CM | POA: Diagnosis not present

## 2015-06-14 DIAGNOSIS — I120 Hypertensive chronic kidney disease with stage 5 chronic kidney disease or end stage renal disease: Secondary | ICD-10-CM | POA: Diagnosis not present

## 2015-06-14 DIAGNOSIS — R0602 Shortness of breath: Secondary | ICD-10-CM | POA: Diagnosis present

## 2015-06-14 DIAGNOSIS — F319 Bipolar disorder, unspecified: Secondary | ICD-10-CM | POA: Diagnosis not present

## 2015-06-14 DIAGNOSIS — I10 Essential (primary) hypertension: Secondary | ICD-10-CM | POA: Diagnosis present

## 2015-06-14 DIAGNOSIS — F250 Schizoaffective disorder, bipolar type: Secondary | ICD-10-CM | POA: Diagnosis present

## 2015-06-14 DIAGNOSIS — Z7982 Long term (current) use of aspirin: Secondary | ICD-10-CM | POA: Diagnosis not present

## 2015-06-14 LAB — I-STAT CHEM 8, ED
BUN: 105 mg/dL — AB (ref 6–20)
CHLORIDE: 102 mmol/L (ref 101–111)
CREATININE: 11.7 mg/dL — AB (ref 0.44–1.00)
Calcium, Ion: 1.11 mmol/L — ABNORMAL LOW (ref 1.12–1.23)
Glucose, Bld: 94 mg/dL (ref 65–99)
HEMATOCRIT: 29 % — AB (ref 36.0–46.0)
Hemoglobin: 9.9 g/dL — ABNORMAL LOW (ref 12.0–15.0)
Potassium: 4.6 mmol/L (ref 3.5–5.1)
Sodium: 138 mmol/L (ref 135–145)
TCO2: 23 mmol/L (ref 0–100)

## 2015-06-14 MED ORDER — NAPROXEN 250 MG PO TABS: 250.0000 mg | ORAL_TABLET | Freq: Every day | ORAL | Status: DC | PRN

## 2015-06-14 MED ORDER — DIPHENHYDRAMINE HCL 25 MG PO CAPS
ORAL_CAPSULE | ORAL | Status: AC
  Administered 2015-06-14: 25 mg
  Filled 2015-06-14: qty 1

## 2015-06-14 MED ORDER — ACETAMINOPHEN 500 MG PO TABS
1000.0000 mg | ORAL_TABLET | Freq: Four times a day (QID) | ORAL | Status: DC | PRN
  Administered 2015-06-14: 650 mg via ORAL

## 2015-06-14 MED ORDER — PENTAFLUOROPROP-TETRAFLUOROETH EX AERO: 1.0000 "application " | INHALATION_SPRAY | CUTANEOUS | Status: DC | PRN

## 2015-06-14 MED ORDER — MORPHINE SULFATE (PF) 2 MG/ML IV SOLN: 2.0000 mg | INTRAVENOUS | Status: DC | PRN

## 2015-06-14 MED ORDER — LIDOCAINE HCL (PF) 1 % IJ SOLN
5.0000 mL | INTRAMUSCULAR | Status: DC | PRN
Start: 2015-06-14 — End: 2015-06-15
  Administered 2015-06-14: 0.5 mL via INTRADERMAL
  Administered 2015-06-15: 5 mL via INTRADERMAL

## 2015-06-14 MED ORDER — LIDOCAINE HCL (PF) 1 % IJ SOLN
INTRAMUSCULAR | Status: AC
  Administered 2015-06-14: 0.5 mL via INTRADERMAL
  Filled 2015-06-14: qty 5

## 2015-06-14 MED ORDER — SODIUM CHLORIDE 0.9% FLUSH
3.0000 mL | Freq: Two times a day (BID) | INTRAVENOUS | Status: DC
  Administered 2015-06-14: 3 mL via INTRAVENOUS

## 2015-06-14 MED ORDER — SODIUM CHLORIDE 0.9 % IV SOLN: 100.0000 mL | INTRAVENOUS | Status: DC | PRN

## 2015-06-14 MED ORDER — METOPROLOL TARTRATE 50 MG PO TABS
50.0000 mg | ORAL_TABLET | Freq: Two times a day (BID) | ORAL | Status: DC
  Administered 2015-06-15: 50 mg via ORAL
  Filled 2015-06-14 (×2): qty 1

## 2015-06-14 MED ORDER — HEPARIN SODIUM (PORCINE) 1000 UNIT/ML IJ SOLN
INTRAMUSCULAR | Status: AC
  Administered 2015-06-14: 1000 [IU]
  Filled 2015-06-14: qty 4

## 2015-06-14 MED ORDER — ACETAMINOPHEN 325 MG PO TABS
ORAL_TABLET | ORAL | Status: AC
  Administered 2015-06-14: 650 mg via ORAL
  Filled 2015-06-14: qty 2

## 2015-06-14 MED ORDER — LEVALBUTEROL HCL 0.63 MG/3ML IN NEBU
0.6300 mg | INHALATION_SOLUTION | RESPIRATORY_TRACT | Status: DC | PRN
Start: 2015-06-14 — End: 2015-06-15

## 2015-06-14 MED ORDER — PREDNISONE 20 MG PO TABS
20.0000 mg | ORAL_TABLET | Freq: Every day | ORAL | Status: DC
  Filled 2015-06-14: qty 1

## 2015-06-14 MED ORDER — ONDANSETRON HCL 4 MG PO TABS: 4.0000 mg | ORAL_TABLET | Freq: Four times a day (QID) | ORAL | Status: DC | PRN

## 2015-06-14 MED ORDER — TRAMADOL HCL 50 MG PO TABS
50.0000 mg | ORAL_TABLET | Freq: Two times a day (BID) | ORAL | Status: DC | PRN
  Administered 2015-06-15 (×2): 50 mg via ORAL
  Filled 2015-06-14 (×2): qty 1

## 2015-06-14 MED ORDER — HEPARIN SODIUM (PORCINE) 5000 UNIT/ML IJ SOLN: 5000.0000 [IU] | Freq: Three times a day (TID) | INTRAMUSCULAR | Status: DC

## 2015-06-14 MED ORDER — ONDANSETRON HCL 4 MG/2ML IJ SOLN: 4.0000 mg | Freq: Four times a day (QID) | INTRAMUSCULAR | Status: DC | PRN

## 2015-06-14 MED ORDER — NAPROXEN SODIUM 220 MG PO TABS: 220.0000 mg | ORAL_TABLET | Freq: Every day | ORAL | Status: DC | PRN
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
## 2015-06-14 NOTE — ED Notes (Signed)
Ambulated Pt pt pulse was 92 Pt walked fine

## 2015-06-14 NOTE — ED Notes (Signed)
Need dialysis 

## 2015-06-15 ENCOUNTER — Encounter (HOSPITAL_COMMUNITY): Payer: Self-pay | Admitting: *Deleted

## 2015-06-15 DIAGNOSIS — I132 Hypertensive heart and chronic kidney disease with heart failure and with stage 5 chronic kidney disease, or end stage renal disease: Secondary | ICD-10-CM | POA: Diagnosis not present

## 2015-06-15 DIAGNOSIS — N186 End stage renal disease: Secondary | ICD-10-CM

## 2015-06-15 DIAGNOSIS — Z992 Dependence on renal dialysis: Secondary | ICD-10-CM | POA: Diagnosis not present

## 2015-06-15 DIAGNOSIS — I151 Hypertension secondary to other renal disorders: Secondary | ICD-10-CM | POA: Diagnosis not present

## 2015-06-15 DIAGNOSIS — E8770 Fluid overload, unspecified: Secondary | ICD-10-CM

## 2015-06-15 DIAGNOSIS — M3214 Glomerular disease in systemic lupus erythematosus: Secondary | ICD-10-CM | POA: Diagnosis not present

## 2015-06-15 DIAGNOSIS — D638 Anemia in other chronic diseases classified elsewhere: Secondary | ICD-10-CM

## 2015-06-15 DIAGNOSIS — N2889 Other specified disorders of kidney and ureter: Secondary | ICD-10-CM

## 2015-06-15 DIAGNOSIS — E8779 Other fluid overload: Secondary | ICD-10-CM

## 2015-06-15 DIAGNOSIS — D631 Anemia in chronic kidney disease: Secondary | ICD-10-CM | POA: Diagnosis not present

## 2015-06-15 DIAGNOSIS — F250 Schizoaffective disorder, bipolar type: Secondary | ICD-10-CM

## 2015-06-15 MED ORDER — DIPHENHYDRAMINE HCL 25 MG PO CAPS
ORAL_CAPSULE | ORAL | Status: AC
  Administered 2015-06-15: 25 mg
  Filled 2015-06-15: qty 1

## 2015-06-15 MED ORDER — SODIUM CHLORIDE 0.9 % IV SOLN: 100.0000 mL | INTRAVENOUS | Status: DC | PRN

## 2015-06-15 MED ORDER — FUROSEMIDE 40 MG PO TABS: 120.0000 mg | ORAL_TABLET | Freq: Every day | ORAL | Status: DC

## 2015-06-15 MED ORDER — FUROSEMIDE 80 MG PO TABS
120.0000 mg | ORAL_TABLET | Freq: Every day | ORAL | Status: DC
  Filled 2015-06-15: qty 1

## 2015-06-15 MED ORDER — LIDOCAINE HCL (PF) 1 % IJ SOLN
INTRAMUSCULAR | Status: AC
  Administered 2015-06-15: 5 mL via INTRADERMAL
  Filled 2015-06-15: qty 5
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
## 2015-06-15 NOTE — Discharge Instructions (Signed)
End-Stage Kidney Disease The kidneys are two organs that lie on either side of the spine between the middle of the back and the front of the abdomen. The kidneys:   Remove wastes and extra water from the blood.   Produce important hormones. These help keep bones strong, regulate blood pressure, and help create red blood cells.   Balance the fluids and chemicals in the blood and tissues. End-stage kidney disease occurs when the kidneys are so damaged that they cannot do their job. When the kidneys cannot do their job, life-threatening problems occur. The body cannot stay clean and strong without the help of the kidneys. In end-stage kidney disease, the kidneys cannot get better.You need a new kidney or treatments to do some of the work healthy kidneys do in order to stay alive. CAUSES  End-stage kidney disease usually occurs when a long-lasting (chronic) kidney disease gets worse. It may also occur after the kidneys are suddenly damaged (acute kidney injury).  SYMPTOMS   Swelling (edema) of the legs, ankles, or feet.   Tiredness (lethargy).   Nausea or vomiting.   Confusion.   Problems with urination, such as:   Decreased urine production.   Frequent urination, especially at night.   Frequent accidents in children who are potty trained.   Muscle twitches and cramps.   Persistent itchiness.   Loss of appetite.   Headaches.   Abnormally dark or light skin.   Numbness in the hands or feet.   Easy bruising.   Frequent hiccups.   Menstruation stops. DIAGNOSIS  Your health care provider will measure your blood pressure and take some tests. These may include:   Urine tests.   Blood tests.   Imaging tests, such as:   An ultrasound exam.   Computed tomography (CT).  A kidney biopsy. TREATMENT  There are two treatments for end-stage kidney disease:   A procedure that removes toxic wastes from the body (dialysis).   Receiving a new kidney  (kidney transplant). Both of these treatments have serious risks and consequences. Your health care provider will help you determine which treatment is best for you based on your health, age, and other factors. In addition to having dialysis or a kidney transplant, you may need to take medicines to control high blood pressure (hypertension) and cholesterol and to decrease phosphorus levels in your blood.  HOME CARE INSTRUCTIONS  Follow your prescribed diet.   Take medicines only as directed by your health care provider.   Do not take any new medicines (prescription, over-the-counter, or nutritional supplements) unless approved by your health care provider. Many medicines can worsen your kidney damage or need to have the dose adjusted.   Keep all follow-up visits as directed by your health care provider. MAKE SURE YOU:  Understand these instructions.  Will watch your condition.  Will get help right away if you are not doing well or get worse.   This information is not intended to replace advice given to you by your health care provider. Make sure you discuss any questions you have with your health care provider.   Document Released: 06/14/2003 Document Revised: 04/14/2014 Document Reviewed: 11/21/2011 Elsevier Interactive Patient Education 2016 Elsevier Inc.  

## 2015-06-15 NOTE — Procedures (Signed)
   HEMODIALYSIS TREATMENT NOTE:  4 hour extra HD session completed via right upper arm AVF (15g/antegrade). Creating venous buttonhole for cannulation due to tortuous shape of AVF. Goal met: 4 liters removed without interruption in ultrafiltration. All blood was returned and hemostasis was achieved within 15 minutes. Report called to Linus Orn, RN.  Rockwell Alexandria, RN, CDN

## 2015-06-15 NOTE — Procedures (Signed)
   HEMODIALYSIS TREATMENT NOTE:  4.5 hour low-heparin dialysis completed via right upper arm AVF (16g/antegrade). Goal met: 4 liters removed without interruption in ultrafiltration. All blood was returned and hemsotasis was achieved within 10 minutes. Report given to Sheena, RN.   , RN, CDN 

## 2015-06-15 NOTE — Discharge Summary (Signed)
Physician Discharge Summary  Sara [name] [name] DOB: [birth year] DOA: 06/14/2015  PCP: No PCP Per Patient  Admit date: 06/14/2015 Discharge date: 06/15/2015   Recommendations for Outpatient Follow-Up:   1. Note: Patient asked me for a refill of her Ultram, telling me she "lost" the script.  A review of the [place] Controlled Substance Reporting System indicates that she filled a script on 06/05/15 for #60 from Dr. Jonnie Finner.  Would be very cautious about prescribing opiates to this patient and I have informed her she needs a PCP for refills.   Discharge Diagnosis:   Principal Problem:    Fluid overload in the setting of ESRD Active Problems:    HTN (hypertension)    ESRD on dialysis ([place])    Anemia    Schizoaffective disorder, bipolar type ([place])    Hypervolemia   Discharge disposition:  Home.   Discharge Condition: Improved.  Diet recommendation: Low sodium, heart healthy.    History of Present Illness:   Sara [name] is an 35 y.o. female with a PMH of ESRD on hemodialysis, lupus, hypertension, chronic anemia, polysubstance abuse, schizophrenia/bipolar disorder, and chronic systolic CHF with an EF [id number] percent who was admitted secondary to needing dialysis. She has been dismissed from several dialysis centers due to her behavioral issues.  Hospital Course by Problem:   Principal Problem:  Fluid overload in the setting of end-stage renal disease and noncompliance with salt/fluid intake - Nephrologist consulted for HD. Lasix started per nephrology.  Active Problems:  Lupus - Continue prednisone.   Chronic systolic CHF - Lasix started. Symptoms compounded by end-stage renal disease and medical noncompliance with low salt diet.   HTN (hypertension) - Continue metoprolol.   Anemia - Secondary to chronic disease. On Epogen.   Schizoaffective disorder, bipolar type ([place]) - Has been managed with Risperdal Consta 25 mg IM every 2  weeks.    Medical Consultants:    Nephrology   Discharge Exam:   Filed Vitals:   06/15/15 1400 06/15/15 1430  BP: 158/78 141/83  Pulse: 96 94  Temp:    Resp:     Filed Vitals:   06/15/15 1300 06/15/15 1330 06/15/15 1400 06/15/15 1430  BP: 141/81 153/86 158/78 141/83  Pulse: 92 91 96 94  Temp:      TempSrc:      Resp:      Height:      Weight:      SpO2:        Gen:  NAD Cardiovascular:  RRR, No M/R/G Respiratory: Lungs CTAB Gastrointestinal: Abdomen soft, NT/ND with normal active bowel sounds. Extremities: 2+ edema   The results of significant diagnostics from this hospitalization (including imaging, microbiology, ancillary and laboratory) are listed below for reference.     Procedures and Diagnostic Studies:   Dg Chest Portable 1 View  06/14/2015  CLINICAL DATA:  Increased lower extremity edema in shortness of breath, dialysis patient EXAM: PORTABLE CHEST 1 VIEW COMPARISON:  06/11/2015 FINDINGS: Severe cardiac enlargement stable. Central vascular congestion. Small right pleural effusion intercalating into the minor fissure. Probable tiny left effusion. No definite pulmonary edema. IMPRESSION: Chronic cardiac enlargement and vascular congestion with small pleural effusions Electronically Signed   By: Skipper Cliche M.D.   On: 06/14/2015 17:39     Labs:   Basic Metabolic Panel:  Recent Labs Lab 06/12/15 0032 06/14/15 1719  NA 139 138  K 5.4* 4.6  CL 103 102  CO2 14*  --   GLUCOSE 79 94  BUN 146* 105*  CREATININE 15.20* 11.70*  CALCIUM 9.3  --    GFR Estimated Creatinine Clearance: 6.9 mL/min (by C-G formula based on Cr of 11.7). Liver Function Tests: No results for input(s): AST, ALT, ALKPHOS, BILITOT, PROT, ALBUMIN in the last 168 hours. No results for input(s): LIPASE, AMYLASE in the last 168 hours. No results for input(s): AMMONIA in the last 168 hours. Coagulation profile No results for input(s): INR, PROTIME in the last 168  hours.  CBC:  Recent Labs Lab 06/14/15 1719  HGB 9.9*  HCT 29.0*     Discharge Instructions:   Discharge Instructions    Call MD for:  extreme fatigue    Complete by:  As directed      Call MD for:  persistant dizziness or light-headedness    Complete by:  As directed      Call MD for:  persistant nausea and vomiting    Complete by:  As directed      Call MD for:  temperature >100.4    Complete by:  As directed      Diet - low sodium heart healthy    Complete by:  As directed      Discharge instructions    Complete by:  As directed   [facility] Controlled Substance Reporting System indicates you were prescribed and filled the following prescription at [facility] in [place], from Dr. Jonnie Finner for Ultram.  We are unable to refill this prescription at this time.  06/05/2015 06/05/2015 TRAMADOL HCL 50 MG TABLET [id number] 60 30 0 0 [id number] SCHERTZ ROBERT D. [place], [place] [id number] [place] [facility] [place], [place], [place] September 17, 1980 [address] [place], [place] [address] 03 10     Increase activity slowly    Complete by:  As directed             Medication List    TAKE these medications        acetaminophen 500 MG tablet  Commonly known as:  TYLENOL  Take 1,000 mg by mouth every 6 (six) hours as needed for moderate pain. Reported on 06/12/2015     aspirin 325 MG tablet  Take 325-650 mg by mouth every 6 (six) hours as needed for headache. Reported on 06/12/2015     furosemide 40 MG tablet  Commonly known as:  LASIX  Take 3 tablets (120 mg total) by mouth daily.     metoprolol 50 MG tablet  Commonly known as:  LOPRESSOR  Take 1 tablet (50 mg total) by mouth 2 (two) times daily.     naproxen sodium 220 MG tablet  Commonly known as:  ANAPROX  Take 220 mg by mouth daily as needed (for pain). Reported on 06/12/2015     predniSONE 20 MG tablet  Commonly known as:  DELTASONE  Take 20 mg by mouth daily with breakfast.     triamcinolone 0.025 %  ointment  Commonly known as:  KENALOG  APPLY 1 APPLICATION TOPICALLY 2 (TWO) TIMES DAILY.      ASK your doctor about these medications        traMADol 50 MG tablet  Commonly known as:  ULTRAM  Take 50 mg by mouth every 12 (twelve) hours as needed for moderate pain or severe pain.           Follow-up Information    Follow up with A PCP of your choice..   Why:  Routine follow up.  Note: you will need a regular  prescribing MD to receive refills on your medications.       Time coordinating discharge: 35 minutes.  Signed:  Dametra Whetsel  Pager [phone] [facility] 06/15/2015, 3:16 PM

## 2015-06-15 NOTE — Care Management Obs Status (Signed)
[place] NOTIFICATION   Patient Details  Name: Sara [name] MRN: [id number] Date of Birth: 01-18-1981   Medicare Observation Status Notification Given:  Yes    Alvie Heidelberg, RN 06/15/2015, 8:24 AM

## 2015-06-16 ENCOUNTER — Other Ambulatory Visit: Payer: Self-pay | Admitting: Internal Medicine

## 2015-06-19 ENCOUNTER — Encounter (HOSPITAL_COMMUNITY): Payer: Self-pay | Admitting: Emergency Medicine

## 2015-06-19 ENCOUNTER — Encounter (HOSPITAL_COMMUNITY): Payer: Self-pay | Admitting: *Deleted

## 2015-06-19 ENCOUNTER — Emergency Department (HOSPITAL_COMMUNITY)
Admission: EM | Admit: 2015-06-19 | Discharge: 2015-06-19 | Disposition: A | Discharge status: Home / Self Care | Payer: Medicare Other | Source: Home / Self Care

## 2015-06-19 DIAGNOSIS — F17210 Nicotine dependence, cigarettes, uncomplicated: Secondary | ICD-10-CM | POA: Insufficient documentation

## 2015-06-19 DIAGNOSIS — Z9119 Patient's noncompliance with other medical treatment and regimen: Secondary | ICD-10-CM

## 2015-06-19 DIAGNOSIS — D631 Anemia in chronic kidney disease: Secondary | ICD-10-CM | POA: Diagnosis not present

## 2015-06-19 DIAGNOSIS — Z992 Dependence on renal dialysis: Secondary | ICD-10-CM | POA: Diagnosis not present

## 2015-06-19 DIAGNOSIS — Z9115 Patient's noncompliance with renal dialysis: Secondary | ICD-10-CM

## 2015-06-19 DIAGNOSIS — F250 Schizoaffective disorder, bipolar type: Secondary | ICD-10-CM | POA: Diagnosis present

## 2015-06-19 DIAGNOSIS — Z7982 Long term (current) use of aspirin: Secondary | ICD-10-CM | POA: Diagnosis not present

## 2015-06-19 DIAGNOSIS — M329 Systemic lupus erythematosus, unspecified: Secondary | ICD-10-CM | POA: Diagnosis not present

## 2015-06-19 DIAGNOSIS — N186 End stage renal disease: Secondary | ICD-10-CM

## 2015-06-19 DIAGNOSIS — Z9111 Patient's noncompliance with dietary regimen: Secondary | ICD-10-CM | POA: Diagnosis not present

## 2015-06-19 DIAGNOSIS — I120 Hypertensive chronic kidney disease with stage 5 chronic kidney disease or end stage renal disease: Secondary | ICD-10-CM | POA: Diagnosis not present

## 2015-06-19 DIAGNOSIS — Z5321 Procedure and treatment not carried out due to patient leaving prior to being seen by health care provider: Secondary | ICD-10-CM

## 2015-06-19 DIAGNOSIS — I132 Hypertensive heart and chronic kidney disease with heart failure and with stage 5 chronic kidney disease, or end stage renal disease: Secondary | ICD-10-CM | POA: Diagnosis not present

## 2015-06-19 DIAGNOSIS — E8779 Other fluid overload: Secondary | ICD-10-CM | POA: Diagnosis not present

## 2015-06-19 DIAGNOSIS — M3214 Glomerular disease in systemic lupus erythematosus: Secondary | ICD-10-CM | POA: Diagnosis present

## 2015-06-19 DIAGNOSIS — E8770 Fluid overload, unspecified: Secondary | ICD-10-CM | POA: Diagnosis not present

## 2015-06-19 DIAGNOSIS — I509 Heart failure, unspecified: Secondary | ICD-10-CM | POA: Insufficient documentation

## 2015-06-19 DIAGNOSIS — I10 Essential (primary) hypertension: Secondary | ICD-10-CM | POA: Diagnosis not present

## 2015-06-19 HISTORY — DX: Patient's noncompliance with other medical treatment and regimen: Z91.19

## 2015-06-19 HISTORY — DX: Patient's noncompliance with other medical treatment and regimen due to unspecified reason: Z91.199

## 2015-06-19 NOTE — ED Notes (Signed)
Pt c/o sob and states she needs dialysis her last session was Friday.

## 2015-06-19 NOTE — ED Notes (Signed)
Pt comes in for dialysis.  Pt states she has had chest discomfort. Pt has coffee and water in triage, advised not to drink this but pt is not complying.

## 2015-06-20 ENCOUNTER — Encounter (HOSPITAL_COMMUNITY): Payer: Self-pay

## 2015-06-20 ENCOUNTER — Inpatient Hospital Stay (HOSPITAL_COMMUNITY)
Admission: EM | Admit: 2015-06-20 | Discharge: 2015-06-23 | DRG: 640 | Discharge status: Against Medical Advice | Payer: Medicare Other | Attending: Internal Medicine | Admitting: Internal Medicine

## 2015-06-20 DIAGNOSIS — Z91119 Patient's noncompliance with dietary regimen due to unspecified reason: Secondary | ICD-10-CM

## 2015-06-20 DIAGNOSIS — I10 Essential (primary) hypertension: Secondary | ICD-10-CM | POA: Diagnosis present

## 2015-06-20 DIAGNOSIS — M3214 Glomerular disease in systemic lupus erythematosus: Secondary | ICD-10-CM | POA: Diagnosis present

## 2015-06-20 DIAGNOSIS — D631 Anemia in chronic kidney disease: Secondary | ICD-10-CM | POA: Diagnosis present

## 2015-06-20 DIAGNOSIS — Z992 Dependence on renal dialysis: Secondary | ICD-10-CM

## 2015-06-20 DIAGNOSIS — E8770 Fluid overload, unspecified: Secondary | ICD-10-CM | POA: Diagnosis present

## 2015-06-20 DIAGNOSIS — M329 Systemic lupus erythematosus, unspecified: Secondary | ICD-10-CM | POA: Diagnosis present

## 2015-06-20 DIAGNOSIS — N186 End stage renal disease: Secondary | ICD-10-CM | POA: Diagnosis not present

## 2015-06-20 DIAGNOSIS — E8779 Other fluid overload: Secondary | ICD-10-CM | POA: Diagnosis not present

## 2015-06-20 DIAGNOSIS — F250 Schizoaffective disorder, bipolar type: Secondary | ICD-10-CM | POA: Diagnosis present

## 2015-06-20 DIAGNOSIS — Z9111 Patient's noncompliance with dietary regimen: Secondary | ICD-10-CM

## 2015-06-20 DIAGNOSIS — T148XXA Other injury of unspecified body region, initial encounter: Secondary | ICD-10-CM

## 2015-06-20 LAB — BASIC METABOLIC PANEL
ANION GAP: 15 (ref 5–15)
BUN: 116 mg/dL — ABNORMAL HIGH (ref 6–20)
CALCIUM: 8.6 mg/dL — AB (ref 8.9–10.3)
CO2: 20 mmol/L — AB (ref 22–32)
Chloride: 102 mmol/L (ref 101–111)
Creatinine, Ser: 10.97 mg/dL — ABNORMAL HIGH (ref 0.44–1.00)
GFR calc non Af Amer: 4 mL/min — ABNORMAL LOW (ref >60)
GFR, EST AFRICAN AMERICAN: 5 mL/min — AB (ref >60)
Glucose, Bld: 117 mg/dL — ABNORMAL HIGH (ref 65–99)
Potassium: 4.5 mmol/L (ref 3.5–5.1)
SODIUM: 137 mmol/L (ref 135–145)

## 2015-06-20 MED ORDER — PENTAFLUOROPROP-TETRAFLUOROETH EX AERO: 1.0000 "application " | INHALATION_SPRAY | CUTANEOUS | Status: DC | PRN

## 2015-06-20 MED ORDER — ACETAMINOPHEN 500 MG PO TABS
ORAL_TABLET | ORAL | Status: AC
  Filled 2015-06-20: qty 2

## 2015-06-20 MED ORDER — DIPHENHYDRAMINE HCL 25 MG PO CAPS
25.0000 mg | ORAL_CAPSULE | Freq: Four times a day (QID) | ORAL | Status: DC | PRN
  Administered 2015-06-20 – 2015-06-23 (×6): 25 mg via ORAL
  Filled 2015-06-20 (×3): qty 1

## 2015-06-20 MED ORDER — SODIUM CHLORIDE 0.9% FLUSH
3.0000 mL | Freq: Two times a day (BID) | INTRAVENOUS | Status: DC
Start: 2015-06-20 — End: 2015-06-23
  Administered 2015-06-20 – 2015-06-21 (×2): 3 mL via INTRAVENOUS

## 2015-06-20 MED ORDER — SODIUM CHLORIDE 0.9 % IV SOLN: 250.0000 mL | INTRAVENOUS | Status: DC | PRN

## 2015-06-20 MED ORDER — FUROSEMIDE 80 MG PO TABS
120.0000 mg | ORAL_TABLET | Freq: Every day | ORAL | Status: DC
  Administered 2015-06-20 – 2015-06-22 (×3): 120 mg via ORAL
  Filled 2015-06-20 (×3): qty 1

## 2015-06-20 MED ORDER — TRAMADOL HCL 50 MG PO TABS
50.0000 mg | ORAL_TABLET | Freq: Two times a day (BID) | ORAL | Status: DC | PRN
  Administered 2015-06-20 – 2015-06-23 (×5): 50 mg via ORAL
  Filled 2015-06-20 (×5): qty 1

## 2015-06-20 MED ORDER — PREDNISONE 20 MG PO TABS
20.0000 mg | ORAL_TABLET | Freq: Every day | ORAL | Status: DC
  Administered 2015-06-21: 20 mg via ORAL
  Filled 2015-06-20: qty 1

## 2015-06-20 MED ORDER — HEPARIN SODIUM (PORCINE) 1000 UNIT/ML IJ SOLN
INTRAMUSCULAR | Status: AC
  Administered 2015-06-20: 1800 [IU] via INTRAVENOUS_CENTRAL
  Filled 2015-06-20: qty 3

## 2015-06-20 MED ORDER — HEPARIN SODIUM (PORCINE) 1000 UNIT/ML DIALYSIS
20.0000 [IU]/kg | INTRAMUSCULAR | Status: DC | PRN
  Administered 2015-06-20 – 2015-06-23 (×3): 1800 [IU] via INTRAVENOUS_CENTRAL
  Filled 2015-06-20 (×4): qty 2

## 2015-06-20 MED ORDER — DIPHENHYDRAMINE HCL 25 MG PO CAPS
ORAL_CAPSULE | ORAL | Status: AC
  Administered 2015-06-20: 25 mg via ORAL
  Filled 2015-06-20: qty 1

## 2015-06-20 MED ORDER — ACETAMINOPHEN 325 MG PO TABS
ORAL_TABLET | ORAL | Status: AC
  Administered 2015-06-20: 650 mg
  Filled 2015-06-20: qty 2

## 2015-06-20 MED ORDER — LIDOCAINE HCL (PF) 1 % IJ SOLN
5.0000 mL | INTRAMUSCULAR | Status: DC | PRN
  Administered 2015-06-20 – 2015-06-23 (×3): 0.5 mL via INTRADERMAL

## 2015-06-20 MED ORDER — SODIUM CHLORIDE 0.9 % IV SOLN: 100.0000 mL | INTRAVENOUS | Status: DC | PRN

## 2015-06-20 MED ORDER — LIDOCAINE-PRILOCAINE 2.5-2.5 % EX CREA: 1.0000 "application " | TOPICAL_CREAM | CUTANEOUS | Status: DC | PRN

## 2015-06-20 MED ORDER — LIDOCAINE HCL (PF) 1 % IJ SOLN
INTRAMUSCULAR | Status: AC
  Administered 2015-06-20: 0.5 mL via INTRADERMAL
  Filled 2015-06-20: qty 5

## 2015-06-20 MED ORDER — ACETAMINOPHEN 500 MG PO TABS
1000.0000 mg | ORAL_TABLET | Freq: Once | ORAL | Status: AC
  Administered 2015-06-20: 1000 mg via ORAL

## 2015-06-20 MED ORDER — SODIUM CHLORIDE 0.9% FLUSH: 3.0000 mL | INTRAVENOUS | Status: DC | PRN

## 2015-06-20 MED ORDER — ACETAMINOPHEN 325 MG PO TABS
650.0000 mg | ORAL_TABLET | Freq: Four times a day (QID) | ORAL | Status: DC | PRN
  Administered 2015-06-20: 650 mg via ORAL
  Filled 2015-06-20: qty 2

## 2015-06-20 MED ORDER — METOPROLOL TARTRATE 50 MG PO TABS
50.0000 mg | ORAL_TABLET | Freq: Two times a day (BID) | ORAL | Status: DC
  Administered 2015-06-20 – 2015-06-22 (×4): 50 mg via ORAL
  Filled 2015-06-20 (×4): qty 1

## 2015-06-20 NOTE — ED Notes (Signed)
Attempted to call report, RN busy, unavailable to take report at this time.

## 2015-06-20 NOTE — Progress Notes (Signed)
Patient requesting to discuss plan of care with MD after returning from dialysis. Text-paged Dr Sarajane Jews to notify. Donavan Foil, RN

## 2015-06-20 NOTE — Procedures (Signed)
   HEMODIALYSIS TREATMENT NOTE:  3.75 hour low-heparin dialysis completed via right upper arm AVF.  Goal met: 3 liters removed without interruption in ultrafiltration. All blood was returned and hemostasis was achieved within 15 minutes.  Of note, right arm is swollen and high venous pressures were recorded with HD.  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 for further assessment.  Report called to Donavan Foil, RN.  Rockwell Alexandria, RN, CDN

## 2015-06-20 NOTE — Consult Note (Addendum)
Sara [name] MRN: [id number] DOB/AGE: Aug 23, 1980 35 y.o. Primary Care Physician:No PCP Per Patient Admit date: 06/20/2015 Chief Complaint:  Chief Complaint  Patient presents with  . Shortness of Breath   HPI: Pt is 35 year old female with past medical hx of ESRD who was admitted with c/o dyspnea  HPI dates back to 2-3 days ago  when pt started feeling short of breath,it was progressive and so pt came to ER. Pt had her dialysis tx on last Friday . Pt seen on the floor,Nephrology is consulted for need of renal replacement therapy. NO c/o chest pain No c/o fever/cough/chills NO c/o nausea/vomiting/ diarrhea  no c/o hematuria Pt main concern was " My feet are hurting    Past Medical History  Diagnosis Date  . Lupus ([place])     lupus w nephritis  . History of blood transfusion     "this is probably my 3rd" (10/09/2014)  . ESRD (end stage renal disease) on dialysis [facility])     "[name]; [facility]" (10/09/2014)  . Bipolar disorder ([place])     [name] 10/09/2014  . Schizophrenia ([place])     [name] 10/09/2014  . Chronic anemia     [name] 10/09/2014  . CHF (congestive heart failure) ([place])     systolic/notes [id number]  . Acute myopericarditis     hx/notes 10/09/2014  . Psychosis   . Hypertension   . Pregnancy   . Low back pain   . Positive ANA (antinuclear antibody) 08/16/2012  . 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 Tobacco abuse 02/20/2014  . Schizoaffective disorder, bipolar type ([place]) 11/20/2014  . Non-compliant patient         Family History  Problem Relation Age of Onset  . Drug abuse Father   . Kidney disease Father     Social History:  reports that she has been smoking Cigarettes.  She has a 2 pack-year smoking history. She uses smokeless tobacco. She reports that she drinks about 4.2 oz of alcohol per week. She reports that she uses illicit drugs (Marijuana).   Allergies:  Allergies  Allergen Reactions  . Ativan [Lorazepam] Other (See Comments)   Dysarthria(patient has difficulty speaking and slurred speech); denies swelling, itching, pain, or numbness.  [name] [Ziprasidone Hcl] Itching and Swelling    Tongue swelling  . Keflex [Cephalexin] Swelling and Other (See Comments)    Tongue swelling. Can't talk   . Haldol [Haloperidol Lactate] Swelling    Tongue swelling. 05/31/15 - MD ok with giving as pt has tolerated in the past  . Other Itching    wool     (Not in a hospital admission)     [id number] from the symptoms mentioned above,there are no other symptoms referable to all systems reviewed.      Physical Exam: Vital signs in last 24 hours: Temp:  [97.6 F (36.4 C)-98.2 F (36.8 C)] 97.7 F (36.5 C) (03/15 0508) Pulse Rate:  [97-109] 109 (03/15 0508) Resp:  [16-24] 24 (03/15 0508) BP: (156-161)/(99-116) 161/116 mmHg (03/15 0508) SpO2:  [98 %-100 %] 98 % (03/15 0508) Weight:  [175 lb (79.379 kg)-196 lb 14.4 oz (89.313 kg)] 196 lb 14.4 oz (89.313 kg) (03/14 2355) Weight change:     Intake/Output from previous day:       Physical Exam: General- pt is awake,alert, follows coomands Resp- No acute REsp distress,  NO Rhonchi, decreased bs at bases. CVS- S1S2 regular in rate and rhythm GIT- BS+, soft, NT, ND  EXT- 2+ LE Edema, NO Cyanosis CNS- CN 2-12 grossly intact. Moving all 4 extremities Access- AVF+, aneurysm present   Lab Results:  hgb 9.9 on 06/14/15  BMET  Recent Labs  06/20/15 0107  NA 137  K 4.5  CL 102  CO2 20*  GLUCOSE 117*  BUN 116*  CREATININE 10.97*  CALCIUM 8.6*    MICRO No results found for this or any previous visit (from the past 240 hour(s)).    Lab Results  Component Value Date   PTH 420* 05/26/2015   CALCIUM 8.6* 06/20/2015   CAION 1.11* 06/14/2015   PHOS 6.4* 05/30/2015      Impression: 1)Renal  ESRD on HD                Pt is not in regular  Schedule sec to her complaince/adheremce issues                Pt last HD was on Friday                 Will dialyze  pt today.   2)HTN Bp not at goal  3)Anemia In ESRD the goal for HGb is 9--11. Pt las HGb ws at goal Will keep on epo   4)CKD Mineral-Bone Disorder PTH acceptable. Secondary Hyperparathyroidism  Present. Phosphorus at goal.  on binders  5)Psych .  Hx of   psycosis Schizophrenia Primary MD following  6)Electrolytes  Normokalemic NOrmonatremic   7)Acid base Co2 Not at goal Sec to non compliance with HD    Plan:  Will dialyze today Will keep on epo Will use 2 k bath Pt in with fluid overload, will need Hd again in  Am   Addendum Pt seen on HD Pt tolerating tx well.   BHUTANI,MANPREET S 06/20/2015, 6:55 AM
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
## 2015-06-20 NOTE — Progress Notes (Signed)
Discussed patient belongings policy with patient. States she prefers to keep her purse and cell phone with her. Stated she had two bottles of home medications (furosemide and metoprolol). Discussed that we send to pharmacy during hospital stay per policy. Stated okay, medications counted by nursing with patient present. Obtained patient signature. Pt stated 1/2 unmarked pill in the metoprolol bottle is "1/2 of my tramadol pill". Noted on home medications form. Notified nursing supervisor, states she will lock up in the pharmacy. Donavan Foil, RN
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
## 2015-06-21 DIAGNOSIS — F17210 Nicotine dependence, cigarettes, uncomplicated: Secondary | ICD-10-CM | POA: Diagnosis present

## 2015-06-21 DIAGNOSIS — Z9119 Patient's noncompliance with other medical treatment and regimen: Secondary | ICD-10-CM | POA: Diagnosis not present

## 2015-06-21 DIAGNOSIS — N186 End stage renal disease: Secondary | ICD-10-CM | POA: Diagnosis not present

## 2015-06-21 DIAGNOSIS — E8770 Fluid overload, unspecified: Secondary | ICD-10-CM | POA: Diagnosis not present

## 2015-06-21 DIAGNOSIS — F250 Schizoaffective disorder, bipolar type: Secondary | ICD-10-CM | POA: Diagnosis present

## 2015-06-21 DIAGNOSIS — Z9111 Patient's noncompliance with dietary regimen: Secondary | ICD-10-CM | POA: Diagnosis not present

## 2015-06-21 DIAGNOSIS — M329 Systemic lupus erythematosus, unspecified: Secondary | ICD-10-CM | POA: Diagnosis not present

## 2015-06-21 DIAGNOSIS — D631 Anemia in chronic kidney disease: Secondary | ICD-10-CM | POA: Diagnosis not present

## 2015-06-21 DIAGNOSIS — Z7982 Long term (current) use of aspirin: Secondary | ICD-10-CM | POA: Diagnosis not present

## 2015-06-21 DIAGNOSIS — Z992 Dependence on renal dialysis: Secondary | ICD-10-CM | POA: Diagnosis not present

## 2015-06-21 DIAGNOSIS — M3214 Glomerular disease in systemic lupus erythematosus: Secondary | ICD-10-CM | POA: Diagnosis not present

## 2015-06-21 DIAGNOSIS — I10 Essential (primary) hypertension: Secondary | ICD-10-CM | POA: Diagnosis not present

## 2015-06-21 DIAGNOSIS — I132 Hypertensive heart and chronic kidney disease with heart failure and with stage 5 chronic kidney disease, or end stage renal disease: Secondary | ICD-10-CM | POA: Diagnosis present

## 2015-06-21 DIAGNOSIS — Z9115 Patient's noncompliance with renal dialysis: Secondary | ICD-10-CM | POA: Diagnosis not present

## 2015-06-21 LAB — RETICULOCYTES
RBC.: 2.77 MIL/uL — AB (ref 3.87–5.11)
RETIC COUNT ABSOLUTE: 66.5 10*3/uL (ref 19.0–186.0)
Retic Ct Pct: 2.4 % (ref 0.4–3.1)

## 2015-06-21 LAB — CBC
HEMATOCRIT: 24.1 % — AB (ref 36.0–46.0)
HEMOGLOBIN: 7.8 g/dL — AB (ref 12.0–15.0)
MCH: 28.2 pg (ref 26.0–34.0)
MCHC: 32.4 g/dL (ref 30.0–36.0)
MCV: 87 fL (ref 78.0–100.0)
Platelets: 196 10*3/uL (ref 150–400)
RBC: 2.77 MIL/uL — AB (ref 3.87–5.11)
RDW: 20.5 % — ABNORMAL HIGH (ref 11.5–15.5)
WBC: 6.9 10*3/uL (ref 4.0–10.5)

## 2015-06-21 LAB — VITAMIN B12: VITAMIN B 12: 439 pg/mL (ref 180–914)

## 2015-06-21 LAB — BASIC METABOLIC PANEL
ANION GAP: 12 (ref 5–15)
BUN: 85 mg/dL — ABNORMAL HIGH (ref 6–20)
CALCIUM: 9.1 mg/dL (ref 8.9–10.3)
CO2: 24 mmol/L (ref 22–32)
Chloride: 102 mmol/L (ref 101–111)
Creatinine, Ser: 8.35 mg/dL — ABNORMAL HIGH (ref 0.44–1.00)
GFR, EST AFRICAN AMERICAN: 6 mL/min — AB (ref >60)
GFR, EST NON AFRICAN AMERICAN: 6 mL/min — AB (ref >60)
GLUCOSE: 86 mg/dL (ref 65–99)
POTASSIUM: 4.7 mmol/L (ref 3.5–5.1)
SODIUM: 138 mmol/L (ref 135–145)

## 2015-06-21 LAB — FOLATE: FOLATE: 17.4 ng/mL (ref >5.9)

## 2015-06-21 LAB — IRON AND TIBC
IRON: 33 ug/dL (ref 28–170)
SATURATION RATIOS: 12 % (ref 10.4–31.8)
TIBC: 273 ug/dL (ref 250–450)
UIBC: 240 ug/dL

## 2015-06-21 LAB — FERRITIN: Ferritin: 792 ng/mL — ABNORMAL HIGH (ref 11–307)

## 2015-06-21 LAB — HEPATITIS B SURFACE ANTIGEN: Hepatitis B Surface Ag: NEGATIVE

## 2015-06-21 MED ORDER — EPOETIN ALFA 10000 UNIT/ML IJ SOLN
10000.0000 [IU] | Freq: Once | INTRAMUSCULAR | Status: AC
  Administered 2015-06-21: 10000 [IU] via INTRAVENOUS

## 2015-06-21 MED ORDER — CAMPHOR-MENTHOL 0.5-0.5 % EX LOTN
1.0000 "application " | TOPICAL_LOTION | Freq: Three times a day (TID) | CUTANEOUS | Status: DC | PRN
  Filled 2015-06-21: qty 222

## 2015-06-21 MED ORDER — NEPRO/CARBSTEADY PO LIQD: 237.0000 mL | Freq: Three times a day (TID) | ORAL | Status: DC | PRN

## 2015-06-21 MED ORDER — DOCUSATE SODIUM 283 MG RE ENEM
1.0000 | ENEMA | RECTAL | Status: DC | PRN
  Filled 2015-06-21: qty 1

## 2015-06-21 MED ORDER — ONDANSETRON HCL 4 MG PO TABS: 4.0000 mg | ORAL_TABLET | Freq: Four times a day (QID) | ORAL | Status: DC | PRN

## 2015-06-21 MED ORDER — ACETAMINOPHEN 325 MG PO TABS
650.0000 mg | ORAL_TABLET | Freq: Four times a day (QID) | ORAL | Status: DC | PRN
  Administered 2015-06-22: 650 mg via ORAL
  Filled 2015-06-21: qty 2

## 2015-06-21 MED ORDER — LIDOCAINE HCL (PF) 1 % IJ SOLN
INTRAMUSCULAR | Status: AC
  Administered 2015-06-21: 0.5 mL via INTRADERMAL
  Filled 2015-06-21: qty 5

## 2015-06-21 MED ORDER — HEPARIN SODIUM (PORCINE) 1000 UNIT/ML IJ SOLN
INTRAMUSCULAR | Status: AC
  Administered 2015-06-21: 1800 [IU] via INTRAVENOUS_CENTRAL
  Filled 2015-06-21: qty 2

## 2015-06-21 MED ORDER — EPOETIN ALFA 10000 UNIT/ML IJ SOLN
INTRAMUSCULAR | Status: AC
  Administered 2015-06-21: 10000 [IU] via INTRAVENOUS
  Filled 2015-06-21: qty 1

## 2015-06-21 MED ORDER — ACETAMINOPHEN 650 MG RE SUPP: 650.0000 mg | Freq: Four times a day (QID) | RECTAL | Status: DC | PRN

## 2015-06-21 MED ORDER — ONDANSETRON HCL 4 MG/2ML IJ SOLN: 4.0000 mg | Freq: Four times a day (QID) | INTRAMUSCULAR | Status: DC | PRN

## 2015-06-21 MED ORDER — ZOLPIDEM TARTRATE 5 MG PO TABS: 5.0000 mg | ORAL_TABLET | Freq: Every evening | ORAL | Status: DC | PRN

## 2015-06-21 MED ORDER — PREDNISONE 20 MG PO TABS
20.0000 mg | ORAL_TABLET | Freq: Once | ORAL | Status: AC
  Administered 2015-06-21: 20 mg via ORAL
  Filled 2015-06-21: qty 1

## 2015-06-21 MED ORDER — DIPHENHYDRAMINE HCL 25 MG PO CAPS
ORAL_CAPSULE | ORAL | Status: AC
  Administered 2015-06-21: 25 mg via ORAL
  Filled 2015-06-21: qty 1

## 2015-06-21 MED ORDER — PREDNISONE 20 MG PO TABS
40.0000 mg | ORAL_TABLET | Freq: Every day | ORAL | Status: DC
  Administered 2015-06-22 – 2015-06-23 (×2): 40 mg via ORAL
  Filled 2015-06-21 (×2): qty 2

## 2015-06-21 MED ORDER — HYDROXYZINE HCL 25 MG PO TABS: 25.0000 mg | ORAL_TABLET | Freq: Three times a day (TID) | ORAL | Status: DC | PRN

## 2015-06-21 MED ORDER — SORBITOL 70 % SOLN: 30.0000 mL | Status: DC | PRN

## 2015-06-21 MED ORDER — CALCIUM CARBONATE 1250 (500 CA) MG PO TABS
500.0000 mg | ORAL_TABLET | Freq: Four times a day (QID) | ORAL | Status: DC | PRN
Start: 2015-06-21 — End: 2015-06-23

## 2015-06-21 NOTE — Care Management Obs Status (Signed)
[facility] NOTIFICATION   Patient Details  Name: Sara [name] MRN: [id number] Date of Birth: [birth year]   Medicare Observation Status Notification Given:  Yes    Alvie Heidelberg, RN 06/21/2015, 10:57 AM

## 2015-06-21 NOTE — Progress Notes (Signed)
Subjective: Patient is feeling better. Still she has some swelling of her legs. Patient denies any difficulty breathing.   Objective: Vital signs in last 24 hours: Temp:  [97.7 F (36.5 C)-98.4 F (36.9 C)] 98.4 F (36.9 C) (03/16 0550) Pulse Rate:  [99-114] 103 (03/16 0550) Resp:  [20-22] 20 (03/16 0550) BP: (150-184)/(95-115) 150/100 mmHg (03/16 0550) SpO2:  [98 %-100 %] 100 % (03/16 0550) Weight:  [190 lb (86.183 kg)-196 lb 13.9 oz (89.3 kg)] 190 lb 4.8 oz (86.32 kg) (03/16 0550)  Intake/Output from previous day: 03/15 0701 - 03/16 0700 In: -  Out: 3100  Intake/Output this shift:    No results for input(s): HGB in the last 72 hours. No results for input(s): WBC, RBC, HCT, PLT in the last 72 hours.  Recent Labs  06/20/15 0107 06/21/15 0611  NA 137 138  K 4.5 4.7  CL 102 102  CO2 20* 24  BUN 116* 85*  CREATININE 10.97* 8.35*  GLUCOSE 117* 86  CALCIUM 8.6* 9.1   No results for input(s): LABPT, INR in the last 72 hours.  Generally patient is alert and in no apparent distress Chest: Decreased breath sound bilaterally she has some inspiratory crackles Heart exam revealed regular rate and rhythm no murmur Extremities she has 2+ edema  Assessment/Plan: Problem #1 fluid overload. Patient is status post hemodialysis yesterday with 3 L removal. Presently feels better however still she has significant fluid overload. Patient inconsistent with her dialysis. Problem #2 end-stage renal disease: She is status post hemodialysis yesterday Problem #3 hypertension: Her blood pressure is reasonably controlled Problem #4 anemia: Presently she is on Epogen Problem #5 history of lupus Problem #6 history of bipolar disorder Problem #7 metabolic bone disease: Her calcium is in range Plan: We'll dialyze patient today for 4 hours 2] were removed 31/2 liters if sbp >90  Haruto Demaria S 06/21/2015, 8:49 AM

## 2015-06-21 NOTE — Progress Notes (Signed)
[name]  Sara [name] [name] DOB: 1980-12-24 DOA: 06/20/2015 PCP: No PCP Per Patient  Assessment/Plan: #1 end-stage renal disease with massive volume overload secondary to noncompliance Patient status post hemodialysis yesterday with nephrology. Patient with clinical improvement. Patient for hemodialysis today. It's been noted that patient has been dismissed from multiple hemodialysis centers and currently has no outpatient hemodialysis center. per nephrology.  #2 history of lupus with lupus nephritis Will double patient's dose of prednisone to twice daily for the next 2 days and back to home dose at 20 mg daily.  #3 bipolar disorder/schizophrenia/schizoaffective disorder Stable. Outpatient follow-up.  #4 hypertension Continue home regimen Lopressor.  #5 anemia of chronic renal disease Anemia panel consistent with anemia of chronic renal disease. No overt bleeding. Follow H&H. Transfusion threshold hemoglobin less than 7.  #6 prophylaxis SCDs for DVT prophylaxis.   Code Status: Full Family Communication: Updated patient. No family present.  Disposition Plan: Home once medically stable and volume overload has improved. Per nephrology.   Consultants:  Nephrology: Dr. Theador Hawthorne 06/20/2015  Procedures:  None  Antibiotics:  None  HPI/Subjective: Patient states shortness of breath has improved. No chest pain. Patient complained of skin itching with multiple pimples and feels lupus might be acting up.  Objective: Filed Vitals:   06/20/15 2107 06/21/15 0550  BP: 158/95 150/100  Pulse: 114 103  Temp: 98.2 F (36.8 C) 98.4 F (36.9 C)  Resp: 20 20    Intake/Output Summary (Last 24 hours) at 06/21/15 1216 Last data filed at 06/20/15 1530  Gross per 24 hour  Intake      0 ml  Output   3100 ml  Net  -3100 ml   Filed Weights   06/20/15 1140 06/20/15 2051 06/21/15 0550  Weight: 89.3 kg (196 lb 13.9 oz) 86.183 kg (190 lb) 86.32 kg (190  lb 4.8 oz)    Exam:   General:  NAD  Cardiovascular: RRR  Respiratory: Bibasilar crackles, no wheezing, no rhonchi   Abdomen: Soft, nontender, nondistended, positive bowel sounds.   Musculoskeletal: , no cyanosis. 1-2+ bilateral lower extremity edema.   Data Reviewed: Basic Metabolic Panel:  Recent Labs Lab 06/14/15 1719 06/20/15 0107 06/21/15 0611  NA 138 137 138  K 4.6 4.5 4.7  CL 102 102 102  CO2  --  20* 24  GLUCOSE 94 117* 86  BUN 105* 116* 85*  CREATININE 11.70* 10.97* 8.35*  CALCIUM  --  8.6* 9.1   Liver Function Tests: No results for input(s): AST, ALT, ALKPHOS, BILITOT, PROT, ALBUMIN in the last 168 hours. No results for input(s): LIPASE, AMYLASE in the last 168 hours. No results for input(s): AMMONIA in the last 168 hours. CBC:  Recent Labs Lab 06/14/15 1719 06/21/15 1022  WBC  --  6.9  HGB 9.9* 7.8*  HCT 29.0* 24.1*  MCV  --  87.0  PLT  --  196   Cardiac Enzymes: No results for input(s): CKTOTAL, CKMB, CKMBINDEX, TROPONINI in the last 168 hours. BNP (last 3 results)  Recent Labs  06/29/14 1439 06/30/14 0116 09/11/14 0815  BNP >4500.0* >4500.0* 1694.9*    ProBNP (last 3 results) No results for input(s): PROBNP in the last 8760 hours.  CBG: No results for input(s): GLUCAP in the last 168 hours.  No results found for this or any previous visit (from the past 240 hour(s)).   Studies: No results found.  Scheduled Meds: . furosemide  120 mg Oral Daily  . metoprolol  50 mg  Oral BID  . predniSONE  20 mg Oral Once  . [START ON 06/22/2015] predniSONE  40 mg Oral Q breakfast  . 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 [facility] Pager [phone]. If 7PM-7AM, please contact night-coverage at [email or web address], password [facility] 06/21/2015, 12:16 PM

## 2015-06-21 NOTE — Progress Notes (Signed)
Patient has hgb 7.8 on labs, hx of chronic anemia documented. Text-paged MD to notify of results. Pt awaiting dialysis today. Donavan Foil, RN

## 2015-06-21 NOTE — Procedures (Signed)
   HEMODIALYSIS TREATMENT NOTE:  4 hour low-heparin dialysis completed via right upper arm AVF (16g/antegrade). Venous buttonhole tunnel creation is underway (5th cannulation today). Limited cannulation sites due to tortuosity of AVF. Goal met: 3.8 liters removed without interruption in ultrafiltration. All blood was returned and hemostasis was achieved within 15 minutes. Report called to Donavan Foil, RN.  Rockwell Alexandria, RN, CDN

## 2015-06-22 DIAGNOSIS — M3214 Glomerular disease in systemic lupus erythematosus: Secondary | ICD-10-CM

## 2015-06-22 DIAGNOSIS — I10 Essential (primary) hypertension: Secondary | ICD-10-CM

## 2015-06-22 DIAGNOSIS — D631 Anemia in chronic kidney disease: Secondary | ICD-10-CM

## 2015-06-22 DIAGNOSIS — N186 End stage renal disease: Secondary | ICD-10-CM

## 2015-06-22 LAB — CBC
HCT: 25.5 % — ABNORMAL LOW (ref 36.0–46.0)
Hemoglobin: 8.1 g/dL — ABNORMAL LOW (ref 12.0–15.0)
MCH: 27.7 pg (ref 26.0–34.0)
MCHC: 31.8 g/dL (ref 30.0–36.0)
MCV: 87.3 fL (ref 78.0–100.0)
PLATELETS: 233 10*3/uL (ref 150–400)
RBC: 2.92 MIL/uL — AB (ref 3.87–5.11)
RDW: 20.9 % — AB (ref 11.5–15.5)
WBC: 8.3 10*3/uL (ref 4.0–10.5)

## 2015-06-22 LAB — RENAL FUNCTION PANEL
ALBUMIN: 2.9 g/dL — AB (ref 3.5–5.0)
Anion gap: 12 (ref 5–15)
BUN: 60 mg/dL — ABNORMAL HIGH (ref 6–20)
CALCIUM: 8.6 mg/dL — AB (ref 8.9–10.3)
CO2: 26 mmol/L (ref 22–32)
CREATININE: 6.5 mg/dL — AB (ref 0.44–1.00)
Chloride: 100 mmol/L — ABNORMAL LOW (ref 101–111)
GFR, EST AFRICAN AMERICAN: 9 mL/min — AB (ref >60)
GFR, EST NON AFRICAN AMERICAN: 8 mL/min — AB (ref >60)
Glucose, Bld: 109 mg/dL — ABNORMAL HIGH (ref 65–99)
Phosphorus: 5.9 mg/dL — ABNORMAL HIGH (ref 2.5–4.6)
Potassium: 4.3 mmol/L (ref 3.5–5.1)
SODIUM: 138 mmol/L (ref 135–145)

## 2015-06-22 MED ORDER — EPOETIN ALFA 10000 UNIT/ML IJ SOLN
10000.0000 [IU] | INTRAMUSCULAR | Status: DC
  Administered 2015-06-23: 10000 [IU] via INTRAVENOUS
  Filled 2015-06-22: qty 1

## 2015-06-22 NOTE — Progress Notes (Signed)
Subjective: Patient is feeling better. Denies any difficulty breathing. Patient also does not have any nausea or vomiting..   Objective: Vital signs in last 24 hours: Temp:  [97.7 F (36.5 C)-98.1 F (36.7 C)] 97.7 F (36.5 C) (03/17 0903) Pulse Rate:  [54-112] 91 (03/17 0903) Resp:  [16-20] 18 (03/17 0903) BP: (128-159)/(78-93) 128/78 mmHg (03/17 0903) SpO2:  [99 %-100 %] 100 % (03/17 0903) Weight:  [185 lb 11.2 oz (84.233 kg)-190 lb 4.1 oz (86.3 kg)] 185 lb 11.2 oz (84.233 kg) (03/17 0903)  Intake/Output from previous day: 03/16 0701 - 03/17 0700 In: -  Out: 3500  Intake/Output this shift:     Recent Labs  06/21/15 1022 06/22/15 0558  HGB 7.8* 8.1*    Recent Labs  06/21/15 1022 06/22/15 0558  WBC 6.9 8.3  RBC 2.77*  2.77* 2.92*  HCT 24.1* 25.5*  PLT 196 233    Recent Labs  06/21/15 0611 06/22/15 0558  NA 138 138  K 4.7 4.3  CL 102 100*  CO2 24 26  BUN 85* 60*  CREATININE 8.35* 6.50*  GLUCOSE 86 109*  CALCIUM 9.1 8.6*   No results for input(s): LABPT, INR in the last 72 hours.  Generally patient is alert and in no apparent distress Chest: Decreased breath sound bilaterally she has some inspiratory crackles Heart exam revealed regular rate and rhythm no murmur Extremities she has 1+ edema  Assessment/Plan: Problem #1 fluid overload. Patient is status post hemodialysis the last 2 days. We're able to remove about 6-1/2 L in totality. She seemed to be feeling much better. Presently she doesn't have any difficulty breathing. Problem #2 end-stage renal disease: She is status post hemodialysis yesterday. Her potassium is normal Problem #3 hypertension: Her blood pressure is reasonably controlled Problem #4 anemia: Her hemoglobin is low but stable. Patient has received Epogen. Problem #5 history of lupus Problem #6 history of bipolar disorder Problem #7 metabolic bone disease: Her calcium is in range Plan: 1]We'll dialyze patient tomorrow  for  41/2hours 2] were removed 4 liters if sbp >90 3 continue with Epogen 10000 u iv after each dialysis  [facility] S 06/22/2015, 9:37 AM

## 2015-06-22 NOTE — Progress Notes (Signed)
Paged the day shift MD to make him aware that patient has no IV access.

## 2015-06-22 NOTE — Care Management Important Message (Signed)
Important Message  Patient Details  Name: KILI ESQUILIN MRN: [id number] Date of Birth: Jul 28, 1980   Medicare Important Message Given:  Yes    Alvie Heidelberg, RN 06/22/2015, 8:16 AM

## 2015-06-22 NOTE — Progress Notes (Signed)
At approximately 2247 when I went to flush the patients IV, there was leakage and the patient required a new IV. There were 4 attempts made in all to get a new IV, the patient is now refusing and will not allow [place] to re-stick her, will make the MD aware and continue to monitor the patient.

## 2015-06-22 NOTE — Progress Notes (Addendum)
[name]    Progress Note   Sara [name] [id number] DOB: 25-Jul-1980 DOA: 06/20/2015 PCP: No PCP Per Patient   Brief Narrative:   Sara [name] is an 35 y.o. female incision renal disease on hemodialysis and noncompliance.  Assessment/Plan:   Dyspnea due to volume overload due to noncompliance/  ESRD needing dialysis [facility]): She is still significantly fluid overloaded, and appreciate renal's assistance. Possible hemodialysis again on 06/22/2015. Continue strict I's and O's and daily weights. Need to have tight control of her intakes. We have to be limited strictly in her In's, 3 L of fluid in containers.  History of lupus nephritis: Continue current dose of prednisone.  Bipolar disorder/schizophrenia/schizoaffective disorder: Currently stable follow-up with psychiatrist and outpatient.  Essential hypertension: Blood pressures improve continue current regimen.  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  DVT Prophylaxis - Lovenox ordered.  Family Communication: none Disposition Plan: Home 2-3 days Code Status:    Code Status Orders        Start     Ordered   06/20/15 1722  Full code   Continuous     06/20/15 1721    Code Status History    Date Active Date Inactive Code Status Order ID Comments User Context   06/14/2015  8:04 PM 06/15/2015  8:07 PM Full Code [id number]  Norval Morton, MD ED   06/12/2015  9:15 AM 06/12/2015  8:01 PM Full Code [id number]  Rexene Alberts, MD ED   06/04/2015  6:44 PM 06/05/2015  4:20 AM Full Code [id number]  Roney Jaffe, MD Inpatient   05/23/2015 12:24 AM 05/31/2015  5:01 PM Full Code [id number]  Norval Morton, MD ED   05/16/2015  3:16 AM 05/16/2015 12:39 PM Full Code [id number]  Orvan Falconer, MD Inpatient   03/12/2015  9:38 PM 03/14/2015  5:57 PM Full Code [id number]  Josephina Gip, PA-C ED   02/20/2015 11:27 PM 02/21/2015  7:45 PM Full Code [id number]  Rosemarie Ax, MD Inpatient   01/09/2015 10:41 PM 01/10/2015  5:03 PM Full Code [id number]  Velvet Bathe, MD Inpatient   12/26/2014  8:09 PM 12/28/2014  5:49 PM Full Code [id number]  Reubin Milan, MD Inpatient   11/19/2014 10:55 PM 11/21/2014  5:58 PM Full Code [id number]  [name], MD ED   10/09/2014  4:49 PM 10/10/2014 12:32 PM Full Code [id number]  Archie Patten, MD Inpatient   08/21/2014  4:16 PM 08/22/2014  7:56 PM Full Code [id number]  Leeanne Rio, MD Inpatient   06/30/2014  3:54 AM 07/01/2014  3:16 PM Full Code [id number]  Olam Idler, MD Inpatient   06/29/2014  1:32 PM 06/29/2014  7:13 PM Full Code [id number]  Timmothy Euler, MD Inpatient   06/25/2014  6:41 AM 06/26/2014  4:39 PM Full Code [id number]  Katheren Shams, DO Inpatient   06/23/2014  5:40 AM 06/23/2014  6:07 PM Full Code [id number]  Orvan Falconer, MD Inpatient   06/07/2014 10:07 PM 06/13/2014 11:05 PM Full Code [id number]  Elberta Leatherwood, MD Inpatient   05/24/2014  2:55 AM 05/25/2014  8:30 PM Full Code [id number]  Nobie Putnam, DO Inpatient   02/20/2014  8:34 PM 02/21/2014 11:04 PM Full Code [id number]  Waldemar Dickens, MD ED   07/18/2013  4:40 PM 07/19/2013  4:41 PM Full Code [id number]  Richarda Blade, MD ED   06/13/2013 11:05 PM 06/14/2013  2:38 PM Full Code [id number]  Evelina Bucy, MD  ED   05/14/2013  8:36 AM 05/18/2013  7:14 PM Full Code [id number]  Emmaline Kluver, MD ED   05/13/2013  3:09 PM 05/14/2013  8:36 AM Full Code [id number]  Domenic Moras, PA-C ED   03/08/2013 12:17 AM 03/14/2013  5:34 PM Full Code [id number]  Leone Haven, MD Inpatient   09/28/2012  6:23 PM 09/30/2012  1:49 PM Full Code [id number]  Linus Mako, PA-C ED   08/09/2012  3:06 AM 08/23/2012 10:52 PM Full Code [id number]  Leone Haven, MD Inpatient   08/08/2012  3:31 PM 08/09/2012  3:06 AM Full Code [id number]  Carlisle Cater, PA-C ED   06/20/2012 10:56 PM 06/22/2012  8:09 PM Full Code [id number]  Hyman Bible, PA-C ED   05/13/2012 10:06 AM 05/13/2012  3:31 PM Full Code [id number]  Nena Polio, PA-C ED   05/13/2012  12:25 AM 05/13/2012 10:06 AM Full Code [id number]  Virgel Manifold, MD ED        IV Access:    Peripheral IV   Procedures and diagnostic studies:   No results found.   Medical Consultants:    None.  Anti-Infectives:   Anti-infectives    None      Subjective:    Sara [name] she relates her breathing is better. No other complains.  Objective:    Filed Vitals:   06/21/15 1630 06/21/15 1700 06/21/15 1717 06/21/15 2146  BP: 159/90 148/85 150/84 141/91  Pulse: 105 112 100 54  Temp:    98.1 F (36.7 C)  TempSrc:    Oral  Resp:   20 20  Height:      Weight:      SpO2:    100%    Intake/Output Summary (Last 24 hours) at 06/22/15 0822 Last data filed at 06/21/15 1705  Gross per 24 hour  Intake      0 ml  Output   3500 ml  Net  -3500 ml   Filed Weights   06/20/15 2051 06/21/15 0550 06/21/15 1300  Weight: 86.183 kg (190 lb) 86.32 kg (190 lb 4.8 oz) 86.3 kg (190 lb 4.1 oz)    Exam: Gen:  NAD Cardiovascular:  RRR. Chest and lungs:   CTAB Abdomen:  Abdomen soft, NT/ND, + BS Extremities:  Massive fluid overload   Data Reviewed:    Labs: Basic Metabolic Panel:  Recent Labs Lab 06/20/15 0107 06/21/15 0611 06/22/15 0558  NA 137 138 138  K 4.5 4.7 4.3  CL 102 102 100*  CO2 20* 24 26  GLUCOSE 117* 86 109*  BUN 116* 85* 60*  CREATININE 10.97* 8.35* 6.50*  CALCIUM 8.6* 9.1 8.6*  PHOS  --   --  5.9*   GFR Estimated Creatinine Clearance: 13.8 mL/min (by C-G formula based on Cr of 6.5). Liver Function Tests:  Recent Labs Lab 06/22/15 0558  ALBUMIN 2.9*   No results for input(s): LIPASE, AMYLASE in the last 168 hours. No results for input(s): AMMONIA in the last 168 hours. Coagulation profile No results for input(s): INR, PROTIME in the last 168 hours.  CBC:  Recent Labs Lab 06/21/15 1022 06/22/15 0558  WBC 6.9 8.3  HGB 7.8* 8.1*  HCT 24.1* 25.5*  MCV 87.0 87.3  PLT 196 233   Cardiac Enzymes: No results for input(s): CKTOTAL,  CKMB, CKMBINDEX, TROPONINI in the last 168 hours. BNP (last 3 results) No results for input(s): PROBNP in the last 8760 hours. CBG: No results for input(s): GLUCAP in the last 168 hours.  D-Dimer: No results for input(s): DDIMER in the last 72 hours. Hgb A1c: No results for input(s): HGBA1C in the last 72 hours. Lipid Profile: No results for input(s): CHOL, HDL, LDLCALC, TRIG, CHOLHDL, LDLDIRECT in the last 72 hours. Thyroid function studies: No results for input(s): TSH, T4TOTAL, T3FREE, THYROIDAB in the last 72 hours.  Invalid input(s): FREET3 Anemia work up:  Recent Labs  06/21/15 1022  VITAMINB12 439  FOLATE 17.4  FERRITIN 792*  TIBC 273  IRON 33  RETICCTPCT 2.4   Sepsis Labs:  Recent Labs Lab 06/21/15 1022 06/22/15 0558  WBC 6.9 8.3   Microbiology No results found for this or any previous visit (from the past 240 hour(s)).   Medications:   . furosemide  120 mg Oral Daily  . metoprolol  50 mg Oral BID  . predniSONE  40 mg Oral Q breakfast  . sodium chloride flush  3 mL Intravenous Q12H   Continuous Infusions:   Time spent: 15 min   LOS: 1 day   Sara [name]  [facility] Pager [phone]  *Please refer to [email or web address], password [name] to get updated schedule on who will round on this patient, as hospitalists switch teams weekly. If 7PM-7AM, please contact night-coverage at [email or web address], password [name] for any overnight needs.  06/22/2015, 8:22 AM

## 2015-06-23 DIAGNOSIS — E8770 Fluid overload, unspecified: Principal | ICD-10-CM

## 2015-06-23 DIAGNOSIS — F250 Schizoaffective disorder, bipolar type: Secondary | ICD-10-CM

## 2015-06-23 LAB — CBC
HEMATOCRIT: 26.3 % — AB (ref 36.0–46.0)
HEMOGLOBIN: 8.4 g/dL — AB (ref 12.0–15.0)
MCH: 27.9 pg (ref 26.0–34.0)
MCHC: 31.9 g/dL (ref 30.0–36.0)
MCV: 87.4 fL (ref 78.0–100.0)
Platelets: 235 10*3/uL (ref 150–400)
RBC: 3.01 MIL/uL — ABNORMAL LOW (ref 3.87–5.11)
RDW: 20.5 % — AB (ref 11.5–15.5)
WBC: 10.8 10*3/uL — ABNORMAL HIGH (ref 4.0–10.5)

## 2015-06-23 LAB — RENAL FUNCTION PANEL
ALBUMIN: 2.8 g/dL — AB (ref 3.5–5.0)
ANION GAP: 12 (ref 5–15)
BUN: 81 mg/dL — AB (ref 6–20)
CALCIUM: 8.3 mg/dL — AB (ref 8.9–10.3)
CO2: 24 mmol/L (ref 22–32)
Chloride: 101 mmol/L (ref 101–111)
Creatinine, Ser: 7.95 mg/dL — ABNORMAL HIGH (ref 0.44–1.00)
GFR calc Af Amer: 7 mL/min — ABNORMAL LOW (ref >60)
GFR calc non Af Amer: 6 mL/min — ABNORMAL LOW (ref >60)
GLUCOSE: 132 mg/dL — AB (ref 65–99)
Phosphorus: 6.5 mg/dL — ABNORMAL HIGH (ref 2.5–4.6)
Potassium: 4.4 mmol/L (ref 3.5–5.1)
SODIUM: 137 mmol/L (ref 135–145)

## 2015-06-23 MED ORDER — DIPHENHYDRAMINE HCL 25 MG PO CAPS
ORAL_CAPSULE | ORAL | Status: AC
  Administered 2015-06-23: 25 mg via ORAL
  Filled 2015-06-23: qty 1

## 2015-06-23 MED ORDER — CALCIUM ACETATE (PHOS BINDER) 667 MG PO CAPS: 667.0000 mg | ORAL_CAPSULE | Freq: Three times a day (TID) | ORAL | Status: DC

## 2015-06-23 MED ORDER — SODIUM CHLORIDE 0.9 % IV SOLN: 100.0000 mL | INTRAVENOUS | Status: DC | PRN

## 2015-06-23 MED ORDER — HEPARIN SODIUM (PORCINE) 1000 UNIT/ML IJ SOLN
INTRAMUSCULAR | Status: AC
  Administered 2015-06-23: 1800 [IU] via INTRAVENOUS_CENTRAL
  Filled 2015-06-23: qty 4

## 2015-06-23 MED ORDER — EPOETIN ALFA 10000 UNIT/ML IJ SOLN
INTRAMUSCULAR | Status: AC
  Administered 2015-06-23: 10000 [IU] via INTRAVENOUS
  Filled 2015-06-23: qty 1

## 2015-06-23 MED ORDER — ACETAMINOPHEN 325 MG PO TABS
ORAL_TABLET | ORAL | Status: AC
  Administered 2015-06-23: 325 mg
  Filled 2015-06-23: qty 1

## 2015-06-23 MED ORDER — LIDOCAINE HCL (PF) 1 % IJ SOLN
INTRAMUSCULAR | Status: AC
  Administered 2015-06-23: 0.5 mL via INTRADERMAL
  Filled 2015-06-23: qty 5

## 2015-06-23 NOTE — Procedures (Signed)
   HEMODIALYSIS TREATMENT NOTE:  4.5 hour low-heparin dialysis completed via right upper arm AVF (16g/antegrade). Goal met: 4.3 liters removed without interruption in ultrafiltration. All blood was returned and hemostasis was achieved within 10 minutes. Report given to [place], Therapist, sports.  Rockwell Alexandria, RN, CDN

## 2015-06-23 NOTE — Progress Notes (Signed)
[name]    Progress Note   [name] [id number] DOB: [birth year] DOA: 06/20/2015 PCP: No PCP Per Patient   Brief Narrative:   Sara [name] is an 35 y.o. female incision renal disease on hemodialysis and noncompliance.  Assessment/Plan:   Dyspnea due to volume overload due to noncompliance/  ESRD needing dialysis [facility]): She is still significantly fluid overloaded, and appreciate renal's assistance. For hemodialysis today and tomorrow. Continue strict I's and O's and daily weights. Need to have tight control of her intakes. Continue strict I's and O's  History of lupus nephritis: Continue current dose of prednisone.  Bipolar disorder/schizophrenia/schizoaffective disorder: Currently stable follow-up with psychiatrist and outpatient.  Essential hypertension: Blood pressures improve continue current regimen.  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  DVT Prophylaxis - Lovenox ordered.  Family Communication: none Disposition Plan: Home 2  days Code Status:    Code Status Orders        Start     Ordered   06/20/15 1722  Full code   Continuous     06/20/15 1721    Code Status History    Date Active Date Inactive Code Status Order ID Comments User Context   06/14/2015  8:04 PM 06/15/2015  8:07 PM Full Code [id number]  Norval Morton, MD ED   06/12/2015  9:15 AM 06/12/2015  8:01 PM Full Code [id number]  Rexene Alberts, MD ED   06/04/2015  6:44 PM 06/05/2015  4:20 AM Full Code [id number]  Roney Jaffe, MD Inpatient   05/23/2015 12:24 AM 05/31/2015  5:01 PM Full Code [id number]  Norval Morton, MD ED   05/16/2015  3:16 AM 05/16/2015 12:39 PM Full Code [id number]  Orvan Falconer, MD Inpatient   03/12/2015  9:38 PM 03/14/2015  5:57 PM Full Code [id number]  Josephina Gip, PA-C ED   02/20/2015 11:27 PM 02/21/2015  7:45 PM Full Code [id number]  Rosemarie Ax, MD Inpatient   01/09/2015 10:41 PM 01/10/2015  5:03 PM Full Code  [id number]  Velvet Bathe, MD Inpatient   12/26/2014  8:09 PM 12/28/2014  5:49 PM Full Code [id number]  Reubin Milan, MD Inpatient   11/19/2014 10:55 PM 11/21/2014  5:58 PM Full Code [id number]  [name], MD ED   10/09/2014  4:49 PM 10/10/2014 12:32 PM Full Code [id number]  Archie Patten, MD Inpatient   08/21/2014  4:16 PM 08/22/2014  7:56 PM Full Code [id number]  Leeanne Rio, MD Inpatient   06/30/2014  3:54 AM 07/01/2014  3:16 PM Full Code [id number]  Olam Idler, MD Inpatient   06/29/2014  1:32 PM 06/29/2014  7:13 PM Full Code [id number]  Timmothy Euler, MD Inpatient   06/25/2014  6:41 AM 06/26/2014  4:39 PM Full Code [id number]  Katheren Shams, DO Inpatient   06/23/2014  5:40 AM 06/23/2014  6:07 PM Full Code [id number]  Orvan Falconer, MD Inpatient   06/07/2014 10:07 PM 06/13/2014 11:05 PM Full Code [id number]  Elberta Leatherwood, MD Inpatient   05/24/2014  2:55 AM 05/25/2014  8:30 PM Full Code [id number]  Nobie Putnam, DO Inpatient   02/20/2014  8:34 PM 02/21/2014 11:04 PM Full Code [id number]  Waldemar Dickens, MD ED   07/18/2013  4:40 PM 07/19/2013  4:41 PM Full Code [id number]  Richarda Blade, MD ED   06/13/2013 11:05 PM 06/14/2013  2:38 PM Full Code [id number]  Evelina Bucy, MD ED   05/14/2013  8:36 AM 05/18/2013  7:14 PM Full Code [id number]  Emmaline Kluver, MD ED   05/13/2013  3:09 PM 05/14/2013  8:36 AM Full Code [id number]  Domenic Moras, PA-C ED   03/08/2013 12:17 AM 03/14/2013  5:34 PM Full Code [id number]  Leone Haven, MD Inpatient   09/28/2012  6:23 PM 09/30/2012  1:49 PM Full Code [id number]  Linus Mako, PA-C ED   08/09/2012  3:06 AM 08/23/2012 10:52 PM Full Code [id number]  Leone Haven, MD Inpatient   08/08/2012  3:31 PM 08/09/2012  3:06 AM Full Code [id number]  Carlisle Cater, PA-C ED   06/20/2012 10:56 PM 06/22/2012  8:09 PM Full Code [id number]  Hyman Bible, PA-C ED   05/13/2012 10:06 AM 05/13/2012  3:31 PM Full Code [id number]  Nena Polio, PA-C ED   05/13/2012 12:25 AM 05/13/2012 10:06 AM Full Code [id number]   Virgel Manifold, MD ED        IV Access:    Peripheral IV   Procedures and diagnostic studies:   No results found.   Medical Consultants:    None.  Anti-Infectives:   Anti-infectives    None      Subjective:    Sara [name] she relates her breathing is better. No other complains.  Objective:    Filed Vitals:   06/22/15 0903 06/22/15 1425 06/22/15 2219 06/23/15 0730  BP: 128/78 143/93 138/85 132/80  Pulse: 91 87 112 110  Temp: 97.7 F (36.5 C) 97.8 F (36.6 C) 97.8 F (36.6 C) 97.6 F (36.4 C)  TempSrc: Oral Oral Oral Oral  Resp: 18 20 20 15   Height: 5\' 7"  (1.702 m)     Weight: 84.233 kg (185 lb 11.2 oz)   87 kg (191 lb 12.8 oz)  SpO2: 100% 99% 100% 100%    Intake/Output Summary (Last 24 hours) at 06/23/15 1051 Last data filed at 06/23/15 0955  Gross per 24 hour  Intake    720 ml  Output      0 ml  Net    720 ml   Filed Weights   06/21/15 1300 06/22/15 0903 06/23/15 0730  Weight: 86.3 kg (190 lb 4.1 oz) 84.233 kg (185 lb 11.2 oz) 87 kg (191 lb 12.8 oz)    Exam: Gen:  NAD Cardiovascular:  RRR. Chest and lungs:   CTAB Abdomen:  Abdomen soft, NT/ND, + BS Extremities:  Massive fluid overload   Data Reviewed:    Labs: Basic Metabolic Panel:  Recent Labs Lab 06/20/15 0107 06/21/15 0611 06/22/15 0558  NA 137 138 138  K 4.5 4.7 4.3  CL 102 102 100*  CO2 20* 24 26  GLUCOSE 117* 86 109*  BUN 116* 85* 60*  CREATININE 10.97* 8.35* 6.50*  CALCIUM 8.6* 9.1 8.6*  PHOS  --   --  5.9*   GFR Estimated Creatinine Clearance: 13.8 mL/min (by C-G formula based on Cr of 6.5). Liver Function Tests:  Recent Labs Lab 06/22/15 0558  ALBUMIN 2.9*   No results for input(s): LIPASE, AMYLASE in the last 168 hours. No results for input(s): AMMONIA in the last 168 hours. Coagulation profile No results for input(s): INR, PROTIME in the last 168 hours.  CBC:  Recent Labs Lab 06/21/15 1022 06/22/15 0558  WBC 6.9 8.3  HGB 7.8* 8.1*    HCT 24.1* 25.5*  MCV 87.0 87.3  PLT 196 233   Cardiac Enzymes: No results for input(s): CKTOTAL, CKMB, CKMBINDEX, TROPONINI in the last 168 hours. BNP (last 3 results) No results  for input(s): PROBNP in the last 8760 hours. CBG: No results for input(s): GLUCAP in the last 168 hours. D-Dimer: No results for input(s): DDIMER in the last 72 hours. Hgb A1c: No results for input(s): HGBA1C in the last 72 hours. Lipid Profile: No results for input(s): CHOL, HDL, LDLCALC, TRIG, CHOLHDL, LDLDIRECT in the last 72 hours. Thyroid function studies: No results for input(s): TSH, T4TOTAL, T3FREE, THYROIDAB in the last 72 hours.  Invalid input(s): FREET3 Anemia work up:  Recent Labs  06/21/15 1022  VITAMINB12 439  FOLATE 17.4  FERRITIN 792*  TIBC 273  IRON 33  RETICCTPCT 2.4   Sepsis Labs:  Recent Labs Lab 06/21/15 1022 06/22/15 0558  WBC 6.9 8.3   Microbiology No results found for this or any previous visit (from the past 240 hour(s)).   Medications:   . calcium acetate  667 mg Oral TID WC  . epoetin (EPOGEN/PROCRIT) injection  10,000 Units Intravenous Q T,Th,Sa-HD  . furosemide  120 mg Oral Daily  . metoprolol  50 mg Oral BID  . predniSONE  40 mg Oral Q breakfast  . sodium chloride flush  3 mL Intravenous Q12H   Continuous Infusions:   Time spent: 15 min   LOS: 2 days   Charlynne Cousins  [facility] Pager [phone]  *Please refer to [email or web address], password [name] to get updated schedule on who will round on this patient, as hospitalists switch teams weekly. If 7PM-7AM, please contact night-coverage at [email or web address], password [name] for any overnight needs.  06/23/2015, 10:51 AM

## 2015-06-23 NOTE — Progress Notes (Signed)
Subjective: Patient offers no complaints. She denies any difficulty breathing and leg swelling is improving.   Objective: Vital signs in last 24 hours: Temp:  [97.6 F (36.4 C)-97.8 F (36.6 C)] 97.6 F (36.4 C) (03/18 0730) Pulse Rate:  [87-112] 110 (03/18 0730) Resp:  [15-20] 15 (03/18 0730) BP: (132-143)/(80-93) 132/80 mmHg (03/18 0730) SpO2:  [99 %-100 %] 100 % (03/18 0730) Weight:  [191 lb 12.8 oz (87 kg)] 191 lb 12.8 oz (87 kg) (03/18 0730)  Intake/Output from previous day: 03/17 0701 - 03/18 0700 In: 720 [P.O.:720] Out: -  Intake/Output this shift:     Recent Labs  06/21/15 1022 06/22/15 0558  HGB 7.8* 8.1*    Recent Labs  06/21/15 1022 06/22/15 0558  WBC 6.9 8.3  RBC 2.77*  2.77* 2.92*  HCT 24.1* 25.5*  PLT 196 233    Recent Labs  06/21/15 0611 06/22/15 0558  NA 138 138  K 4.7 4.3  CL 102 100*  CO2 24 26  BUN 85* 60*  CREATININE 8.35* 6.50*  GLUCOSE 86 109*  CALCIUM 9.1 8.6*   No results for input(s): LABPT, INR in the last 72 hours.  Generally patient is alert and in no apparent distress Chest: Decreased breath sound bilaterally she has some inspiratory crackles Heart exam revealed regular rate and rhythm no murmur Extremities she has 1+ edema  Assessment/Plan: Problem #1 fluid overload. Patient is asymptomatic however still she has some edema. Problem #2 end-stage renal disease: She is status post hemodialysis the day before yesterday yesterday. Her potassium is normal . Presently she denies any nausea or vomiting and her appetite is good. Problem #3 hypertension: Her blood pressure is reasonably controlled Problem #4 anemia: Her hemoglobin is low but stable. Patient is on Epogen.. Problem #5 history of lupus Problem #6 history of bipolar disorder Problem #7 metabolic bone disease: Her calcium is in range but her phosphorus is slightly high. Plan: 1]We'll dialyze patient tomorrow  for 41/2hours 2] were removed 4 liters if sbp >90 3]  continue with Epogen 10000 u iv after each dialysis 4] we'll start on PhosLo 667 mg 1 tablet by mouth 3 times a day with meals.  Sara [name] S 06/23/2015, 9:10 AM

## 2015-06-23 NOTE — Progress Notes (Signed)
Patient requested to leave AMA, papers signed, MD, Dr. Sarajane Jews notified.

## 2015-06-25 ENCOUNTER — Observation Stay (HOSPITAL_COMMUNITY)
Admission: EM | Admit: 2015-06-25 | Discharge: 2015-06-25 | Disposition: A | Discharge status: Home / Self Care | Payer: Medicare Other | Attending: Internal Medicine | Admitting: Internal Medicine

## 2015-06-25 ENCOUNTER — Encounter (HOSPITAL_COMMUNITY): Payer: Self-pay | Admitting: Emergency Medicine

## 2015-06-25 DIAGNOSIS — I5030 Unspecified diastolic (congestive) heart failure: Secondary | ICD-10-CM | POA: Diagnosis present

## 2015-06-25 DIAGNOSIS — M3214 Glomerular disease in systemic lupus erythematosus: Secondary | ICD-10-CM | POA: Diagnosis not present

## 2015-06-25 DIAGNOSIS — N186 End stage renal disease: Secondary | ICD-10-CM | POA: Diagnosis present

## 2015-06-25 DIAGNOSIS — I132 Hypertensive heart and chronic kidney disease with heart failure and with stage 5 chronic kidney disease, or end stage renal disease: Principal | ICD-10-CM | POA: Insufficient documentation

## 2015-06-25 DIAGNOSIS — F17210 Nicotine dependence, cigarettes, uncomplicated: Secondary | ICD-10-CM | POA: Insufficient documentation

## 2015-06-25 DIAGNOSIS — R0602 Shortness of breath: Secondary | ICD-10-CM | POA: Diagnosis not present

## 2015-06-25 DIAGNOSIS — D631 Anemia in chronic kidney disease: Secondary | ICD-10-CM | POA: Diagnosis not present

## 2015-06-25 DIAGNOSIS — Z79899 Other long term (current) drug therapy: Secondary | ICD-10-CM | POA: Diagnosis not present

## 2015-06-25 DIAGNOSIS — F209 Schizophrenia, unspecified: Secondary | ICD-10-CM | POA: Diagnosis not present

## 2015-06-25 DIAGNOSIS — Z7982 Long term (current) use of aspirin: Secondary | ICD-10-CM | POA: Diagnosis not present

## 2015-06-25 DIAGNOSIS — Z9111 Patient's noncompliance with dietary regimen: Secondary | ICD-10-CM | POA: Diagnosis not present

## 2015-06-25 DIAGNOSIS — I5020 Unspecified systolic (congestive) heart failure: Secondary | ICD-10-CM | POA: Diagnosis not present

## 2015-06-25 DIAGNOSIS — F319 Bipolar disorder, unspecified: Secondary | ICD-10-CM | POA: Insufficient documentation

## 2015-06-25 DIAGNOSIS — I120 Hypertensive chronic kidney disease with stage 5 chronic kidney disease or end stage renal disease: Secondary | ICD-10-CM | POA: Diagnosis not present

## 2015-06-25 DIAGNOSIS — F250 Schizoaffective disorder, bipolar type: Secondary | ICD-10-CM | POA: Diagnosis present

## 2015-06-25 DIAGNOSIS — I10 Essential (primary) hypertension: Secondary | ICD-10-CM | POA: Diagnosis present

## 2015-06-25 DIAGNOSIS — Z992 Dependence on renal dialysis: Secondary | ICD-10-CM | POA: Diagnosis not present

## 2015-06-25 DIAGNOSIS — M329 Systemic lupus erythematosus, unspecified: Secondary | ICD-10-CM | POA: Diagnosis present

## 2015-06-25 LAB — BASIC METABOLIC PANEL
ANION GAP: 16 — AB (ref 5–15)
BUN: 68 mg/dL — ABNORMAL HIGH (ref 6–20)
CO2: 27 mmol/L (ref 22–32)
Calcium: 9 mg/dL (ref 8.9–10.3)
Chloride: 99 mmol/L — ABNORMAL LOW (ref 101–111)
Creatinine, Ser: 7.62 mg/dL — ABNORMAL HIGH (ref 0.44–1.00)
GFR, EST AFRICAN AMERICAN: 7 mL/min — AB (ref >60)
GFR, EST NON AFRICAN AMERICAN: 6 mL/min — AB (ref >60)
GLUCOSE: 69 mg/dL (ref 65–99)
POTASSIUM: 4 mmol/L (ref 3.5–5.1)
SODIUM: 142 mmol/L (ref 135–145)

## 2015-06-25 LAB — CBC
HEMATOCRIT: 31.1 % — AB (ref 36.0–46.0)
HEMOGLOBIN: 9.5 g/dL — AB (ref 12.0–15.0)
MCH: 27.9 pg (ref 26.0–34.0)
MCHC: 30.5 g/dL (ref 30.0–36.0)
MCV: 91.2 fL (ref 78.0–100.0)
Platelets: 256 10*3/uL (ref 150–400)
RBC: 3.41 MIL/uL — AB (ref 3.87–5.11)
RDW: 20.8 % — ABNORMAL HIGH (ref 11.5–15.5)
WBC: 11.5 10*3/uL — AB (ref 4.0–10.5)

## 2015-06-25 MED ORDER — NAPROXEN SODIUM 220 MG PO TABS: 220.0000 mg | ORAL_TABLET | Freq: Every day | ORAL | Status: DC | PRN

## 2015-06-25 MED ORDER — ONDANSETRON HCL 4 MG/2ML IJ SOLN: 4.0000 mg | Freq: Three times a day (TID) | INTRAMUSCULAR | Status: DC | PRN

## 2015-06-25 MED ORDER — SODIUM CHLORIDE 0.9 % IV SOLN
100.0000 mL | INTRAVENOUS | Status: DC | PRN
Start: 2015-06-25 — End: 2015-06-25

## 2015-06-25 MED ORDER — ASPIRIN 325 MG PO TABS: 325.0000 mg | ORAL_TABLET | Freq: Four times a day (QID) | ORAL | Status: DC | PRN

## 2015-06-25 MED ORDER — HEPARIN SODIUM (PORCINE) 1000 UNIT/ML IJ SOLN
INTRAMUSCULAR | Status: AC
  Administered 2015-06-25: 1000 [IU] via INTRAVENOUS
  Filled 2015-06-25: qty 3

## 2015-06-25 MED ORDER — TRAMADOL HCL 50 MG PO TABS
50.0000 mg | ORAL_TABLET | Freq: Two times a day (BID) | ORAL | Status: DC | PRN
  Administered 2015-06-25: 50 mg via ORAL
  Filled 2015-06-25: qty 1

## 2015-06-25 MED ORDER — EPOETIN ALFA 10000 UNIT/ML IJ SOLN
INTRAMUSCULAR | Status: AC
  Administered 2015-06-25: 10000 [IU]
  Filled 2015-06-25: qty 1

## 2015-06-25 MED ORDER — PREDNISONE 20 MG PO TABS: 40.0000 mg | ORAL_TABLET | Freq: Every day | ORAL | Status: DC

## 2015-06-25 MED ORDER — DIPHENHYDRAMINE HCL 50 MG/ML IJ SOLN
25.0000 mg | Freq: Once | INTRAMUSCULAR | Status: AC
  Administered 2015-06-25: 25 mg via INTRAVENOUS
  Filled 2015-06-25: qty 1

## 2015-06-25 MED ORDER — HEPARIN SODIUM (PORCINE) 1000 UNIT/ML IJ SOLN
1000.0000 [IU] | Freq: Once | INTRAMUSCULAR | Status: AC
  Administered 2015-06-25: 1000 [IU] via INTRAVENOUS

## 2015-06-25 MED ORDER — FUROSEMIDE 80 MG PO TABS: 120.0000 mg | ORAL_TABLET | Freq: Every day | ORAL | Status: DC

## 2015-06-25 MED ORDER — LIDOCAINE-PRILOCAINE 2.5-2.5 % EX CREA: 1.0000 "application " | TOPICAL_CREAM | CUTANEOUS | Status: DC | PRN

## 2015-06-25 MED ORDER — PENTAFLUOROPROP-TETRAFLUOROETH EX AERO: 1.0000 "application " | INHALATION_SPRAY | CUTANEOUS | Status: DC | PRN

## 2015-06-25 MED ORDER — LIDOCAINE HCL (PF) 1 % IJ SOLN
5.0000 mL | INTRAMUSCULAR | Status: DC | PRN
  Administered 2015-06-25: 0.5 mL via INTRADERMAL

## 2015-06-25 MED ORDER — ACETAMINOPHEN 500 MG PO TABS: 1000.0000 mg | ORAL_TABLET | Freq: Four times a day (QID) | ORAL | Status: DC | PRN

## 2015-06-25 MED ORDER — ALTEPLASE 2 MG IJ SOLR: 2.0000 mg | Freq: Once | INTRAMUSCULAR | Status: DC | PRN

## 2015-06-25 MED ORDER — METOPROLOL TARTRATE 50 MG PO TABS: 50.0000 mg | ORAL_TABLET | Freq: Two times a day (BID) | ORAL | Status: DC

## 2015-06-25 MED ORDER — NAPROXEN 250 MG PO TABS: 250.0000 mg | ORAL_TABLET | Freq: Every day | ORAL | Status: DC | PRN

## 2015-06-25 MED ORDER — LIDOCAINE HCL (PF) 1 % IJ SOLN
INTRAMUSCULAR | Status: AC
  Administered 2015-06-25: 0.5 mL via INTRADERMAL
  Filled 2015-06-25: qty 5
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
## 2015-06-25 NOTE — ED Notes (Signed)
MD at bedside. 

## 2015-06-25 NOTE — ED Notes (Signed)
Pt here for dialysis. Pt receives dialysis [name]. States she was last dialyzed on Saturday. Pt noted to have edema to face, abdomen, and bilateral lower extremities. Pt also reports mild SOB.
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
## 2015-06-25 NOTE — ED Notes (Signed)
Patient continually calls out for food, patient has been made aware she will not be feed in the ER and we are waiting for a bed assignment.

## 2015-06-25 NOTE — Procedures (Signed)
   HEMODIALYSIS TREATMENT NOTE:  4.5 hour dialysis treatment ordered.  HD machine set up to receive patient upon her transfer from ED to 308.  Pt wanted to eat meal first, then have social worker completed "some forms" before transferring to HD unit.  Tx was initiated at 1445.  Pt stated, "I'll have to come off at 5:30 so I don't miss my ride."  Risks of cutting HD session short were explained to pt, she verbalized understanding, and signed [place] waiver. She received 2 hours and 50 minutes of dialysis with a net UF of 3.3 liters.  Dr. Lowanda Foster was notified. All blood was returned and hemostasis was achieved within 15 minutes.  Report given to Karolee Ohs, RN.  Rockwell Alexandria, RN, CDN

## 2015-06-25 NOTE — Progress Notes (Signed)
Pt left AMA.  Appropriate AMA paperwork signed and filed.  Pt was in stable condition upon leaving the hospital.  Pt stated that she had to go now so she wouldn't miss her ride back to [place].  Pt taken down to ED parking lot in wheelchair in no apparent distress.  Nurse remained with pt until her ride arrived.

## 2015-06-25 NOTE — ED Notes (Signed)
Patient given ice chips per RN approval at this time.

## 2015-06-27 ENCOUNTER — Encounter (HOSPITAL_COMMUNITY): Payer: Self-pay | Admitting: Emergency Medicine

## 2015-06-27 ENCOUNTER — Observation Stay (HOSPITAL_COMMUNITY)
Admission: EM | Admit: 2015-06-27 | Discharge: 2015-06-27 | Disposition: A | Discharge status: Home / Self Care | Payer: Medicare Other | Attending: Internal Medicine | Admitting: Internal Medicine

## 2015-06-27 DIAGNOSIS — N186 End stage renal disease: Secondary | ICD-10-CM | POA: Diagnosis not present

## 2015-06-27 DIAGNOSIS — Z992 Dependence on renal dialysis: Secondary | ICD-10-CM | POA: Insufficient documentation

## 2015-06-27 DIAGNOSIS — M3214 Glomerular disease in systemic lupus erythematosus: Secondary | ICD-10-CM | POA: Diagnosis not present

## 2015-06-27 DIAGNOSIS — F17210 Nicotine dependence, cigarettes, uncomplicated: Secondary | ICD-10-CM | POA: Insufficient documentation

## 2015-06-27 DIAGNOSIS — I132 Hypertensive heart and chronic kidney disease with heart failure and with stage 5 chronic kidney disease, or end stage renal disease: Principal | ICD-10-CM | POA: Insufficient documentation

## 2015-06-27 DIAGNOSIS — I509 Heart failure, unspecified: Secondary | ICD-10-CM | POA: Diagnosis not present

## 2015-06-27 DIAGNOSIS — I10 Essential (primary) hypertension: Secondary | ICD-10-CM

## 2015-06-27 DIAGNOSIS — D631 Anemia in chronic kidney disease: Secondary | ICD-10-CM | POA: Diagnosis present

## 2015-06-27 DIAGNOSIS — M329 Systemic lupus erythematosus, unspecified: Secondary | ICD-10-CM | POA: Diagnosis not present

## 2015-06-27 DIAGNOSIS — Z9111 Patient's noncompliance with dietary regimen: Secondary | ICD-10-CM | POA: Diagnosis not present

## 2015-06-27 DIAGNOSIS — I120 Hypertensive chronic kidney disease with stage 5 chronic kidney disease or end stage renal disease: Secondary | ICD-10-CM | POA: Diagnosis not present

## 2015-06-27 LAB — I-STAT CHEM 8, ED
BUN: 70 mg/dL — AB (ref 6–20)
CHLORIDE: 102 mmol/L (ref 101–111)
CREATININE: 8.9 mg/dL — AB (ref 0.44–1.00)
Calcium, Ion: 1.05 mmol/L — ABNORMAL LOW (ref 1.12–1.23)
Glucose, Bld: 99 mg/dL (ref 65–99)
HEMATOCRIT: 29 % — AB (ref 36.0–46.0)
Hemoglobin: 9.9 g/dL — ABNORMAL LOW (ref 12.0–15.0)
Potassium: 4.2 mmol/L (ref 3.5–5.1)
Sodium: 139 mmol/L (ref 135–145)
TCO2: 24 mmol/L (ref 0–100)

## 2015-06-27 MED ORDER — TRAMADOL HCL 50 MG PO TABS
50.0000 mg | ORAL_TABLET | Freq: Two times a day (BID) | ORAL | Status: DC | PRN
  Administered 2015-06-27: 50 mg via ORAL
  Filled 2015-06-27: qty 1

## 2015-06-27 MED ORDER — ACETAMINOPHEN 500 MG PO TABS: 1000.0000 mg | ORAL_TABLET | Freq: Four times a day (QID) | ORAL | Status: DC | PRN

## 2015-06-27 MED ORDER — SODIUM CHLORIDE 0.9% FLUSH: 3.0000 mL | INTRAVENOUS | Status: DC | PRN

## 2015-06-27 MED ORDER — ONDANSETRON HCL 4 MG/2ML IJ SOLN: 4.0000 mg | Freq: Four times a day (QID) | INTRAMUSCULAR | Status: DC | PRN

## 2015-06-27 MED ORDER — EPOETIN ALFA 10000 UNIT/ML IJ SOLN
INTRAMUSCULAR | Status: AC
  Administered 2015-06-27: 8000 [IU] via INTRAVENOUS
  Filled 2015-06-27: qty 1

## 2015-06-27 MED ORDER — NAPROXEN SODIUM 220 MG PO TABS: 220.0000 mg | ORAL_TABLET | Freq: Every day | ORAL | Status: DC | PRN

## 2015-06-27 MED ORDER — NAPROXEN 250 MG PO TABS: 250.0000 mg | ORAL_TABLET | Freq: Every day | ORAL | Status: DC | PRN

## 2015-06-27 MED ORDER — LIDOCAINE HCL (PF) 1 % IJ SOLN
5.0000 mL | INTRAMUSCULAR | Status: DC | PRN
  Administered 2015-06-27: 0.5 mL via INTRADERMAL

## 2015-06-27 MED ORDER — ENOXAPARIN SODIUM 30 MG/0.3ML ~~LOC~~ SOLN: 30.0000 mg | SUBCUTANEOUS | Status: DC

## 2015-06-27 MED ORDER — DIPHENHYDRAMINE HCL 25 MG PO CAPS
ORAL_CAPSULE | ORAL | Status: AC
  Administered 2015-06-27: 25 mg via ORAL
  Filled 2015-06-27: qty 1

## 2015-06-27 MED ORDER — METOPROLOL TARTRATE 50 MG PO TABS: 50.0000 mg | ORAL_TABLET | Freq: Two times a day (BID) | ORAL | Status: DC

## 2015-06-27 MED ORDER — LIDOCAINE HCL (PF) 1 % IJ SOLN
INTRAMUSCULAR | Status: AC
  Administered 2015-06-27: 0.5 mL via INTRADERMAL
  Filled 2015-06-27: qty 5

## 2015-06-27 MED ORDER — DIPHENHYDRAMINE HCL 25 MG PO CAPS
25.0000 mg | ORAL_CAPSULE | Freq: Once | ORAL | Status: AC
  Administered 2015-06-27: 25 mg via ORAL

## 2015-06-27 MED ORDER — SODIUM CHLORIDE 0.9 % IV SOLN: 250.0000 mL | INTRAVENOUS | Status: DC | PRN

## 2015-06-27 MED ORDER — HEPARIN SODIUM (PORCINE) 1000 UNIT/ML IJ SOLN
INTRAMUSCULAR | Status: AC
  Administered 2015-06-27: 1800 [IU]
  Filled 2015-06-27: qty 4

## 2015-06-27 MED ORDER — ONDANSETRON HCL 4 MG PO TABS: 4.0000 mg | ORAL_TABLET | Freq: Four times a day (QID) | ORAL | Status: DC | PRN

## 2015-06-27 MED ORDER — SODIUM CHLORIDE 0.9 % IV SOLN: 100.0000 mL | INTRAVENOUS | Status: DC | PRN

## 2015-06-27 MED ORDER — EPOETIN ALFA 10000 UNIT/ML IJ SOLN
8000.0000 [IU] | Freq: Once | INTRAMUSCULAR | Status: AC
  Administered 2015-06-27: 8000 [IU] via INTRAVENOUS

## 2015-06-27 MED ORDER — PREDNISONE 20 MG PO TABS: 40.0000 mg | ORAL_TABLET | Freq: Every day | ORAL | Status: DC

## 2015-06-27 MED ORDER — LIDOCAINE-PRILOCAINE 2.5-2.5 % EX CREA
1.0000 | TOPICAL_CREAM | CUTANEOUS | Status: DC | PRN
Start: 2015-06-27 — End: 2015-06-27

## 2015-06-27 MED ORDER — FUROSEMIDE 80 MG PO TABS: 120.0000 mg | ORAL_TABLET | Freq: Every day | ORAL | Status: DC

## 2015-06-27 MED ORDER — SODIUM CHLORIDE 0.9% FLUSH: 3.0000 mL | Freq: Two times a day (BID) | INTRAVENOUS | Status: DC

## 2015-06-27 MED ORDER — PENTAFLUOROPROP-TETRAFLUOROETH EX AERO: 1.0000 "application " | INHALATION_SPRAY | CUTANEOUS | Status: DC | PRN
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
## 2015-06-27 NOTE — Progress Notes (Addendum)
Patient admitted this AM for dialysis. After dialysis, patient wanted to be discharged. MD wanted to keep her overnight for dialysis tomorrow. Explained this to the patient. Patient decided to leave AMA. AMA papers signed. IV removed. Patient refused meds prior to leaving AMA. Dr. Roderic Palau aware.

## 2015-06-27 NOTE — ED Notes (Signed)
Here for dialysis

## 2015-06-27 NOTE — Consult Note (Signed)
Sara [name] MRN: [id number] DOB/AGE: 1980-04-10 35 y.o. Primary Care Physician:No PCP Per Patient Admit date: 06/27/2015 Chief Complaint:  Chief Complaint  Patient presents with  . Dialysis     [name]   HPI: Pt is 35 year old female with past medical hx of ESRD who was admitted with c/o dyspnea," I need dialysis "  HPI dates back to 2-3 days ago  when pt had her last dialysis( which she signed off early) started feeling short of breath,and so pt came to ER asking for her dialysis. Pt had her dialysis tx on last Monday.. Pt seen on for need of renal replacement therapy. NO c/o chest pain No c/o fever/cough/chills NO c/o nausea/vomiting/ diarrhea  no c/o hematuria Pt main concern is " I am little short of breath,I need my dialysis"    Past Medical History  Diagnosis Date  . Lupus ([place])     lupus w nephritis  . History of blood transfusion     "this is probably my 3rd" (10/09/2014)  . ESRD (end stage renal disease) on dialysis [facility])     "[name]; [facility]" (10/09/2014)  . Bipolar disorder ([place])     [name] 10/09/2014  . Schizophrenia ([place])     [name] 10/09/2014  . Chronic anemia     [name] 10/09/2014  . CHF (congestive heart failure) ([place])     systolic/notes [id number]  . Acute myopericarditis     hx/notes 10/09/2014  . Psychosis   . Hypertension   . Pregnancy   . Low back pain   . Positive ANA (antinuclear antibody) 08/16/2012  . 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 Lupus nephritis ([place]) 08/19/2012  . Tobacco abuse 02/20/2014  . Schizoaffective disorder, bipolar type ([place]) 11/20/2014  . Non-compliant patient         Family History  Problem Relation Age of Onset  . Drug abuse Father   . Kidney disease Father     Social History:  reports that she has been smoking Cigarettes.  She has a 2 pack-year smoking history. She uses smokeless tobacco. She reports that she drinks about 4.2 oz of alcohol per week. She reports that she uses illicit drugs (Marijuana).   Allergies:  Allergies   Allergen Reactions  . Ativan [Lorazepam] Other (See Comments)    Dysarthria(patient has difficulty speaking and slurred speech); denies swelling, itching, pain, or numbness.  [name] [Ziprasidone Hcl] Itching and Swelling    Tongue swelling  . Keflex [Cephalexin] Swelling and Other (See Comments)    Tongue swelling. Can't talk   . Haldol [Haloperidol Lactate] Swelling    Tongue swelling. 05/31/15 - MD ok with giving as pt has tolerated in the past Pt can take benadryl.  . Other Itching    wool     (Not in a hospital admission)     [id number] from the symptoms mentioned above,there are no other symptoms referable to all systems reviewed.      Physical Exam: Vital signs in last 24 hours: Temp:  [97.6 F (36.4 C)] 97.6 F (36.4 C) (03/22 0812) Pulse Rate:  [102-110] 102 (03/22 0922) Resp:  [18] 18 (03/22 0812) BP: (115-116)/(82-85) 116/85 mmHg (03/22 0921) SpO2:  [100 %] 100 % (03/22 0922) Weight:  [196 lb 1.6 oz (88.95 kg)] 196 lb 1.6 oz (88.95 kg) (03/22 0808) Weight change:     Intake/Output from previous day:       Physical Exam: General- pt is awake,alert, follows coomands Resp- No acute REsp distress,  decreased bs  at bases. CVS- S1S2 regular in rate and rhythm, NO rubs  GIT- BS+, soft, NT, ND EXT- 2+ LE Edema, NO Cyanosis CNS- CN 2-12 grossly intact. Moving all 4 extremities Access- AVF+, aneurysm present   Lab Results:  CBC    Component Value Date/Time   WBC 11.5* 06/25/2015 0850   RBC 3.41* 06/25/2015 0850   RBC 2.77* 06/21/2015 1022   HGB 9.9* 06/27/2015 0848   HCT 29.0* 06/27/2015 0848   PLT 256 06/25/2015 0850   MCV 91.2 06/25/2015 0850   MCH 27.9 06/25/2015 0850   MCHC 30.5 06/25/2015 0850   RDW 20.8* 06/25/2015 0850   LYMPHSABS 1.2 06/04/2015 1448   MONOABS 0.5 06/04/2015 1448   EOSABS 0.2 06/04/2015 1448   BASOSABS 0.0 06/04/2015 1448      BMET  Recent Labs  06/25/15 0850 06/27/15 0848  NA 142 139  K 4.0 4.2  CL 99* 102   CO2 27  --   GLUCOSE 69 99  BUN 68* 70*  CREATININE 7.62* 8.90*  CALCIUM 9.0  --     MICRO No results found for this or any previous visit (from the past 240 hour(s)).    Lab Results  Component Value Date   PTH 420* 05/26/2015   CALCIUM 9.0 06/25/2015   CAION 1.05* 06/27/2015   PHOS 6.5* 06/23/2015      Impression: 1)Renal  ESRD on HD                Pt is not in regular  Schedule sec to her complaince/adheremce issues                Pt last HD was on Monday                 Will dialyze pt today.   2)HTN Bp  at goal  3)Anemia In ESRD the goal for HGb is 9--11. Pt HGb is at goal Will keep on epo   4)CKD Mineral-Bone Disorder PTH acceptable. Secondary Hyperparathyroidism  Present. Phosphorus at goal.  on binders  5)Psych .  Hx of   psycosis Schizophrenia Primary MD following  6)Electrolytes  Normokalemic NOrmonatremic   7)Acid base Co2 Not at goal Sec to non compliance with HD    Plan:  Will dialyze today Will keep on epo Will use 2 k bath Pt in with fluid overload, will need Hd again in  Am if still inpt     Shayn Madole S 06/27/2015, 9:40 AM

## 2015-06-27 NOTE — Procedures (Signed)
   HEMODIALYSIS TREATMENT NOTE:  4.5 hour low-heparin dialysis completed via right upper arm AVF (15g/antegrade). Goal met; 4.5 liters removed without interruption in ultrafiltration. All blood was returned and hemostasis was achieved within 10 minutes. Report given to Janace Aris, RN.  Rockwell Alexandria, RN, CDN

## 2015-06-27 NOTE — Discharge Summary (Signed)
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 female with a history of end-stage renal disease on hemodialysis and a long history of noncompliance, presents to the emergency room for dialysis. Her last dialysis session was on 3/20 and was apparently cut short by 2 hours. She denies any chest pain, shortness of breath at this time. She does complain of pain in her feet. She has not had any fever or cough. Nephrology has been consulted for patient to receive dialysis.  Hospital Course:  Patient was admitted to the hospital for dialysis. She had evidence of volume overload. She underwent dialysis session for 4 hours. She was seen by nephrology who recommended another session of dialysis tomorrow since she had significant volume overload. The patient refused to stay in the hospital for further dialysis and signed out [place].  Procedures:  Hemodialysis for 4 hours  Consultations:  Nephrology  Allergies  Allergen Reactions  . Ativan [Lorazepam] Other (See Comments)    Dysarthria(patient has difficulty speaking and slurred speech); denies swelling, itching, pain, or numbness.  [name] [Ziprasidone Hcl] Itching and Swelling    Tongue swelling  . Keflex [Cephalexin] Swelling and Other (See Comments)    Tongue swelling. Can't talk   . Haldol [Haloperidol Lactate] Swelling    Tongue swelling. 05/31/15 - MD  ok with giving as pt has tolerated in the past Pt can take benadryl.  . Other Itching    wool      The results of significant diagnostics from this hospitalization (including imaging, microbiology, ancillary and laboratory) are listed below for reference.    Significant Diagnostic Studies: Dg Chest 2 View  06/12/2015  CLINICAL DATA:  Shortness of breath for 5 days. EXAM: CHEST  2 VIEW COMPARISON:  Radiographs 06/08/2015, multiple priors. FINDINGS: Cardiomegaly is stable. Vascular congestion again seen. Small pleural effusions, with minimal fluid in the minor fissure. No confluent airspace disease or pneumothorax. Resorption of both distal clavicle again seen. IMPRESSION: Chronic cardiomegaly, vascular congestion, and small pleural effusions. Electronically Signed   By: Jeb Levering M.D.   On: 06/12/2015 00:07   Dg Chest 2 View  06/04/2015  CLINICAL DATA:  Shortness of breath.  Chronic renal failure. EXAM: CHEST  2 VIEW COMPARISON:  06/02/2015 and 05/27/2015 FINDINGS: There is chronic cardiomegaly. There is slight pulmonary vascular congestion which has improved since the prior study. Tiny bilateral pleural effusions, unchanged. No acute osseous abnormality. Slight thoracolumbar scoliosis. IMPRESSION: Chronic cardiomegaly and pulmonary vascular congestion, slightly improved. Stable tiny effusions. Electronically Signed   By: Lorriane Shire M.D.   On: 06/04/2015 15:38   Dg Chest 2 View  06/02/2015  CLINICAL DATA:  Patient with productive cough for 2 days. EXAM: CHEST  2 VIEW COMPARISON:  Chest radiograph 05/27/2015. FINDINGS: Marked cardiomegaly. Pulmonary vascular redistribution. No large area of pulmonary consolidation. No definite pleural effusion or pneumothorax. Re- demonstrated osteolysis  of the distal clavicles. IMPRESSION: Cardiomegaly without acute cardiopulmonary process. Re- demonstrated osteolysis of the distal clavicles. Electronically Signed   By: Lovey Newcomer M.D.   On: 06/02/2015  19:36   Dg Chest Portable 1 View  06/14/2015  CLINICAL DATA:  Increased lower extremity edema in shortness of breath, dialysis patient EXAM: PORTABLE CHEST 1 VIEW COMPARISON:  06/11/2015 FINDINGS: Severe cardiac enlargement stable. Central vascular congestion. Small right pleural effusion intercalating into the minor fissure. Probable tiny left effusion. No definite pulmonary edema. IMPRESSION: Chronic cardiac enlargement and vascular congestion with small pleural effusions Electronically Signed   By: Skipper Cliche M.D.   On: 06/14/2015 17:39   Dg Chest Portable 1 View  06/08/2015  CLINICAL DATA:  Acute onset of shortness of breath. Initial encounter. EXAM: PORTABLE CHEST 1 VIEW COMPARISON:  Chest radiograph performed 06/04/2015 FINDINGS: The lungs are well-aerated. Vascular congestion is noted. Mildly increased interstitial markings may reflect mild interstitial edema or possibly pneumonia. There is no evidence of pleural effusion or pneumothorax. The cardiomediastinal silhouette is enlarged. No acute osseous abnormalities are seen. IMPRESSION: Vascular congestion and cardiomegaly. Mildly increased interstitial markings may reflect mild interstitial edema or possibly pneumonia. Electronically Signed   By: Garald Balding M.D.   On: 06/08/2015 05:11    Microbiology: No results found for this or any previous visit (from the past 240 hour(s)).   Labs: Basic Metabolic Panel:  Recent Labs Lab 06/21/15 0611 06/22/15 0558 06/23/15 1225 06/25/15 0850 06/27/15 0848  NA 138 138 137 142 139  K 4.7 4.3 4.4 4.0 4.2  CL 102 100* 101 99* 102  CO2 24 26 24 27   --   GLUCOSE 86 109* 132* 69 99  BUN 85* 60* 81* 68* 70*  CREATININE 8.35* 6.50* 7.95* 7.62* 8.90*  CALCIUM 9.1 8.6* 8.3* 9.0  --   PHOS  --  5.9* 6.5*  --   --    Liver Function Tests:  Recent Labs Lab 06/22/15 0558 06/23/15 1225  ALBUMIN 2.9* 2.8*   No results for input(s): LIPASE, AMYLASE in the last 168 hours. No results for  input(s): AMMONIA in the last 168 hours. CBC:  Recent Labs Lab 06/21/15 1022 06/22/15 0558 06/23/15 1225 06/25/15 0850 06/27/15 0848  WBC 6.9 8.3 10.8* 11.5*  --   HGB 7.8* 8.1* 8.4* 9.5* 9.9*  HCT 24.1* 25.5* 26.3* 31.1* 29.0*  MCV 87.0 87.3 87.4 91.2  --   PLT 196 233 235 256  --    Cardiac Enzymes: No results for input(s): CKTOTAL, CKMB, CKMBINDEX, TROPONINI in the last 168 hours. BNP: BNP (last 3 results)  Recent Labs  06/29/14 1439 06/30/14 0116 09/11/14 0815  BNP >4500.0* >4500.0* 1694.9*    ProBNP (last 3 results) No results for input(s): PROBNP in the last 8760 hours.  CBG: No results for input(s): GLUCAP in the last 168 hours.     SignedKathie Dike MD.  [facility] 06/27/2015, 7:43 PM

## 2015-06-27 NOTE — Procedures (Addendum)
   HEMODIALYSIS NURSING NOTE:  Unable to modify hemodialysis orders as they were marked "completed" by nursing staff.  Modifications per Dr. Toya Smothers telephone order: Optiflux 160NR, 15g needles, Qb 350.  Also, HD was initiated before noting order for lab collection.  Rockwell Alexandria, RN, CDN
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
## 2015-06-28 ENCOUNTER — Ambulatory Visit: Payer: Self-pay | Admitting: Obstetrics & Gynecology

## 2015-06-29 ENCOUNTER — Encounter (HOSPITAL_COMMUNITY): Payer: Self-pay | Admitting: Emergency Medicine

## 2015-06-29 ENCOUNTER — Observation Stay (HOSPITAL_COMMUNITY)
Admission: EM | Admit: 2015-06-29 | Discharge: 2015-06-29 | Disposition: A | Discharge status: Home / Self Care | Payer: Medicare Other | Attending: Family Medicine | Admitting: Family Medicine

## 2015-06-29 DIAGNOSIS — M3214 Glomerular disease in systemic lupus erythematosus: Secondary | ICD-10-CM | POA: Diagnosis present

## 2015-06-29 DIAGNOSIS — Z7982 Long term (current) use of aspirin: Secondary | ICD-10-CM | POA: Diagnosis not present

## 2015-06-29 DIAGNOSIS — R0602 Shortness of breath: Secondary | ICD-10-CM | POA: Diagnosis present

## 2015-06-29 DIAGNOSIS — E8779 Other fluid overload: Secondary | ICD-10-CM | POA: Diagnosis not present

## 2015-06-29 DIAGNOSIS — I120 Hypertensive chronic kidney disease with stage 5 chronic kidney disease or end stage renal disease: Secondary | ICD-10-CM | POA: Diagnosis not present

## 2015-06-29 DIAGNOSIS — F319 Bipolar disorder, unspecified: Secondary | ICD-10-CM | POA: Insufficient documentation

## 2015-06-29 DIAGNOSIS — N186 End stage renal disease: Secondary | ICD-10-CM | POA: Diagnosis not present

## 2015-06-29 DIAGNOSIS — I509 Heart failure, unspecified: Secondary | ICD-10-CM | POA: Insufficient documentation

## 2015-06-29 DIAGNOSIS — F17210 Nicotine dependence, cigarettes, uncomplicated: Secondary | ICD-10-CM | POA: Diagnosis not present

## 2015-06-29 DIAGNOSIS — R062 Wheezing: Secondary | ICD-10-CM | POA: Insufficient documentation

## 2015-06-29 DIAGNOSIS — I10 Essential (primary) hypertension: Secondary | ICD-10-CM | POA: Diagnosis not present

## 2015-06-29 DIAGNOSIS — M7989 Other specified soft tissue disorders: Secondary | ICD-10-CM | POA: Diagnosis not present

## 2015-06-29 DIAGNOSIS — Z992 Dependence on renal dialysis: Secondary | ICD-10-CM | POA: Insufficient documentation

## 2015-06-29 DIAGNOSIS — Z79899 Other long term (current) drug therapy: Secondary | ICD-10-CM | POA: Insufficient documentation

## 2015-06-29 DIAGNOSIS — I132 Hypertensive heart and chronic kidney disease with heart failure and with stage 5 chronic kidney disease, or end stage renal disease: Secondary | ICD-10-CM | POA: Diagnosis not present

## 2015-06-29 DIAGNOSIS — E8770 Fluid overload, unspecified: Secondary | ICD-10-CM | POA: Diagnosis present

## 2015-06-29 LAB — I-STAT CHEM 8, ED
BUN: 69 mg/dL — ABNORMAL HIGH (ref 6–20)
CALCIUM ION: 1.15 mmol/L (ref 1.12–1.23)
CHLORIDE: 99 mmol/L — AB (ref 101–111)
Creatinine, Ser: 8.6 mg/dL — ABNORMAL HIGH (ref 0.44–1.00)
Glucose, Bld: 78 mg/dL (ref 65–99)
HCT: 29 % — ABNORMAL LOW (ref 36.0–46.0)
HEMOGLOBIN: 9.9 g/dL — AB (ref 12.0–15.0)
Potassium: 4.6 mmol/L (ref 3.5–5.1)
SODIUM: 138 mmol/L (ref 135–145)
TCO2: 27 mmol/L (ref 0–100)

## 2015-06-29 LAB — CBC
HCT: 25.8 % — ABNORMAL LOW (ref 36.0–46.0)
Hemoglobin: 8.1 g/dL — ABNORMAL LOW (ref 12.0–15.0)
MCH: 28.2 pg (ref 26.0–34.0)
MCHC: 31.4 g/dL (ref 30.0–36.0)
MCV: 89.9 fL (ref 78.0–100.0)
Platelets: 191 10*3/uL (ref 150–400)
RBC: 2.87 MIL/uL — ABNORMAL LOW (ref 3.87–5.11)
RDW: 20.7 % — ABNORMAL HIGH (ref 11.5–15.5)
WBC: 9.9 10*3/uL (ref 4.0–10.5)

## 2015-06-29 LAB — RENAL FUNCTION PANEL
Albumin: 3.1 g/dL — ABNORMAL LOW (ref 3.5–5.0)
Anion gap: 11 (ref 5–15)
BUN: 82 mg/dL — AB (ref 6–20)
CHLORIDE: 100 mmol/L — AB (ref 101–111)
CO2: 23 mmol/L (ref 22–32)
CREATININE: 8.04 mg/dL — AB (ref 0.44–1.00)
Calcium: 8.4 mg/dL — ABNORMAL LOW (ref 8.9–10.3)
GFR calc Af Amer: 7 mL/min — ABNORMAL LOW (ref >60)
GFR calc non Af Amer: 6 mL/min — ABNORMAL LOW (ref >60)
GLUCOSE: 99 mg/dL (ref 65–99)
PHOSPHORUS: 6.6 mg/dL — AB (ref 2.5–4.6)
Potassium: 5.3 mmol/L — ABNORMAL HIGH (ref 3.5–5.1)
Sodium: 134 mmol/L — ABNORMAL LOW (ref 135–145)

## 2015-06-29 MED ORDER — LIDOCAINE HCL (PF) 1 % IJ SOLN
INTRAMUSCULAR | Status: AC
  Administered 2015-06-29: 0.5 mL via INTRADERMAL
  Filled 2015-06-29: qty 5

## 2015-06-29 MED ORDER — HEPARIN SODIUM (PORCINE) 1000 UNIT/ML IJ SOLN
INTRAMUSCULAR | Status: AC
  Administered 2015-06-29: 1800 [IU] via INTRAVENOUS_CENTRAL
  Filled 2015-06-29: qty 4

## 2015-06-29 MED ORDER — SODIUM CHLORIDE 0.9 % IV SOLN: 100.0000 mL | INTRAVENOUS | Status: DC | PRN

## 2015-06-29 MED ORDER — ACETAMINOPHEN 325 MG PO TABS: 650.0000 mg | ORAL_TABLET | Freq: Four times a day (QID) | ORAL | Status: DC | PRN

## 2015-06-29 MED ORDER — EPOETIN ALFA 10000 UNIT/ML IJ SOLN
10000.0000 [IU] | INTRAMUSCULAR | Status: DC
  Administered 2015-06-29: 10000 [IU] via INTRAVENOUS
  Filled 2015-06-29 (×2): qty 1

## 2015-06-29 MED ORDER — EPOETIN ALFA 10000 UNIT/ML IJ SOLN
10000.0000 [IU] | INTRAMUSCULAR | Status: DC
  Filled 2015-06-29 (×2): qty 1

## 2015-06-29 MED ORDER — LIDOCAINE HCL (PF) 1 % IJ SOLN
5.0000 mL | INTRAMUSCULAR | Status: DC | PRN
  Administered 2015-06-29: 0.5 mL via INTRADERMAL

## 2015-06-29 MED ORDER — PENTAFLUOROPROP-TETRAFLUOROETH EX AERO
1.0000 "application " | INHALATION_SPRAY | CUTANEOUS | Status: DC | PRN
  Administered 2015-06-29: 1 via TOPICAL

## 2015-06-29 MED ORDER — TRAMADOL HCL 50 MG PO TABS
50.0000 mg | ORAL_TABLET | Freq: Two times a day (BID) | ORAL | Status: DC | PRN
  Administered 2015-06-29: 50 mg via ORAL
  Filled 2015-06-29: qty 1

## 2015-06-29 MED ORDER — DIPHENHYDRAMINE HCL 25 MG PO CAPS
ORAL_CAPSULE | ORAL | Status: AC
  Administered 2015-06-29: 25 mg via ORAL
  Filled 2015-06-29: qty 1

## 2015-06-29 MED ORDER — EPOETIN ALFA 10000 UNIT/ML IJ SOLN
INTRAMUSCULAR | Status: AC
  Administered 2015-06-29: 10000 [IU] via INTRAVENOUS
  Filled 2015-06-29: qty 1

## 2015-06-29 MED ORDER — PENTAFLUOROPROP-TETRAFLUOROETH EX AERO
INHALATION_SPRAY | CUTANEOUS | Status: AC
  Administered 2015-06-29: 1 via TOPICAL
  Filled 2015-06-29: qty 103.5

## 2015-06-29 MED ORDER — FUROSEMIDE 80 MG PO TABS: 120.0000 mg | ORAL_TABLET | Freq: Every day | ORAL | Status: DC

## 2015-06-29 MED ORDER — LIDOCAINE-PRILOCAINE 2.5-2.5 % EX CREA: 1.0000 "application " | TOPICAL_CREAM | CUTANEOUS | Status: DC | PRN

## 2015-06-29 MED ORDER — DIPHENHYDRAMINE HCL 25 MG PO CAPS
25.0000 mg | ORAL_CAPSULE | Freq: Four times a day (QID) | ORAL | Status: DC | PRN
  Administered 2015-06-29: 25 mg via ORAL
  Filled 2015-06-29: qty 1

## 2015-06-29 MED ORDER — HEPARIN SODIUM (PORCINE) 1000 UNIT/ML DIALYSIS
20.0000 [IU]/kg | INTRAMUSCULAR | Status: DC | PRN
  Administered 2015-06-29: 1800 [IU] via INTRAVENOUS_CENTRAL
  Filled 2015-06-29 (×2): qty 2

## 2015-06-29 MED ORDER — PREDNISONE 20 MG PO TABS: 40.0000 mg | ORAL_TABLET | Freq: Every day | ORAL | Status: DC

## 2015-06-29 MED ORDER — METOPROLOL TARTRATE 50 MG PO TABS
50.0000 mg | ORAL_TABLET | Freq: Two times a day (BID) | ORAL | Status: DC
  Filled 2015-06-29: qty 1
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
## 2015-06-29 NOTE — Procedures (Addendum)
   HEMODIALYSIS TREATMENT NOTE:  4 hour low heparin dialysis completed via right upper arm AVF.  HD initiated at 67 and, initially, pt stated, "I'm gonna have to sign off, I can't miss my bus at 6pm."  Pt then received call from her mother, who offered to collect pt later so that dialysis session could be completed.  Pt then stated, "I'll just run 4 hours."  HD was discontinued at 4 hour mark, at pt's request [facility] waiver signed). Net UF 4 liters. All blood was returned and hemostasis was achieved within 15 minutes.    We had a frank discussion about her sodium and fluid intake.  Pt presented to HD unit with one cup of ice, one cup of Ginger Ale and two unopened cans of Ginger Ale. She reports, "I'm on steroids so I'm always hungry.  They [hospital] don't feed me enough."  She ate full dinner tray, 6 packs of graham crackers, a strawberry pie, and a Slim Jim during the course of HD session and asked her mother to bring her "some food" when she came.  BLEs remain edematous.  I explained that Dr. Lowanda Foster planned for an additional dialysis treatment tomorrow. Pt stated, "Oh, okay. I'll just come back.  I can't spend the night here.  I gotta get me something to eat."  I explained the complexity of the admission and consultation process, and encouraged pt to stay the night, as opposed to leaving AMA tonight only to return tomorrow.  Upon transfer to her room I observed three cans of Ginger Ale and several graham cracker wrappers on her bedside table.  Fluid and sodium restriction was, again, strongly encouraged. Report given to Birdie Hopes, RN.  Total HD time:  4 hours Net UF 4 liters Pre weight 89.9kg Post weight 86 kg  Rockwell Alexandria, RN, [employer]
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
## 2015-06-29 NOTE — ED Notes (Signed)
PT c/o SOB x2 days and stated was sent to ED by Dr. Hinda Lenis for admission. PT talking on cell phone to family member and no NAD noted.

## 2015-06-30 ENCOUNTER — Non-Acute Institutional Stay (HOSPITAL_COMMUNITY)
Admission: EM | Admit: 2015-06-30 | Discharge: 2015-07-01 | Disposition: A | Discharge status: Home / Self Care | Payer: Medicare Other | Attending: Emergency Medicine | Admitting: Emergency Medicine

## 2015-06-30 DIAGNOSIS — N189 Chronic kidney disease, unspecified: Secondary | ICD-10-CM | POA: Diagnosis present

## 2015-06-30 DIAGNOSIS — N186 End stage renal disease: Secondary | ICD-10-CM | POA: Insufficient documentation

## 2015-06-30 DIAGNOSIS — M329 Systemic lupus erythematosus, unspecified: Secondary | ICD-10-CM | POA: Insufficient documentation

## 2015-06-30 DIAGNOSIS — F17210 Nicotine dependence, cigarettes, uncomplicated: Secondary | ICD-10-CM | POA: Insufficient documentation

## 2015-06-30 DIAGNOSIS — R000 Tachycardia, unspecified: Secondary | ICD-10-CM | POA: Diagnosis not present

## 2015-06-30 DIAGNOSIS — Z4902 Encounter for fitting and adjustment of peritoneal dialysis catheter: Secondary | ICD-10-CM | POA: Diagnosis not present

## 2015-06-30 DIAGNOSIS — I132 Hypertensive heart and chronic kidney disease with heart failure and with stage 5 chronic kidney disease, or end stage renal disease: Secondary | ICD-10-CM | POA: Diagnosis not present

## 2015-06-30 DIAGNOSIS — I120 Hypertensive chronic kidney disease with stage 5 chronic kidney disease or end stage renal disease: Secondary | ICD-10-CM | POA: Diagnosis present

## 2015-06-30 DIAGNOSIS — Z992 Dependence on renal dialysis: Secondary | ICD-10-CM

## 2015-06-30 DIAGNOSIS — I5022 Chronic systolic (congestive) heart failure: Secondary | ICD-10-CM | POA: Diagnosis not present

## 2015-07-01 ENCOUNTER — Encounter (HOSPITAL_COMMUNITY): Payer: Self-pay | Admitting: Emergency Medicine

## 2015-07-01 DIAGNOSIS — I132 Hypertensive heart and chronic kidney disease with heart failure and with stage 5 chronic kidney disease, or end stage renal disease: Secondary | ICD-10-CM | POA: Diagnosis not present

## 2015-07-01 DIAGNOSIS — N189 Chronic kidney disease, unspecified: Secondary | ICD-10-CM | POA: Diagnosis present

## 2015-07-01 MED ORDER — PENTAFLUOROPROP-TETRAFLUOROETH EX AERO: 1.0000 "application " | INHALATION_SPRAY | CUTANEOUS | Status: DC | PRN

## 2015-07-01 MED ORDER — HEPARIN SODIUM (PORCINE) 1000 UNIT/ML DIALYSIS
1000.0000 [IU] | INTRAMUSCULAR | Status: DC | PRN
  Filled 2015-07-01: qty 1

## 2015-07-01 MED ORDER — HYDROXYZINE HCL 25 MG PO TABS
ORAL_TABLET | ORAL | Status: AC
  Administered 2015-07-01: 25 mg via ORAL
  Filled 2015-07-01: qty 1

## 2015-07-01 MED ORDER — SODIUM CHLORIDE 0.9 % IV SOLN: 100.0000 mL | INTRAVENOUS | Status: DC | PRN

## 2015-07-01 MED ORDER — ALTEPLASE 2 MG IJ SOLR: 2.0000 mg | Freq: Once | INTRAMUSCULAR | Status: DC | PRN

## 2015-07-01 MED ORDER — LIDOCAINE HCL (PF) 1 % IJ SOLN: 5.0000 mL | INTRAMUSCULAR | Status: DC | PRN

## 2015-07-01 MED ORDER — ACETAMINOPHEN 325 MG PO TABS
ORAL_TABLET | ORAL | Status: AC
  Administered 2015-07-01: 650 mg via ORAL
  Filled 2015-07-01: qty 2

## 2015-07-01 MED ORDER — LIDOCAINE-PRILOCAINE 2.5-2.5 % EX CREA: 1.0000 "application " | TOPICAL_CREAM | CUTANEOUS | Status: DC | PRN

## 2015-07-01 MED ORDER — HEPARIN SODIUM (PORCINE) 1000 UNIT/ML DIALYSIS
100.0000 [IU]/kg | INTRAMUSCULAR | Status: DC | PRN
  Filled 2015-07-01: qty 9

## 2015-07-01 MED ORDER — ACETAMINOPHEN 325 MG PO TABS
650.0000 mg | ORAL_TABLET | Freq: Once | ORAL | Status: AC
  Administered 2015-07-01: 650 mg via ORAL

## 2015-07-01 MED ORDER — HYDROXYZINE HCL 25 MG PO TABS
25.0000 mg | ORAL_TABLET | Freq: Once | ORAL | Status: AC
  Administered 2015-07-01: 25 mg via ORAL

## 2015-07-01 NOTE — ED Notes (Signed)
Patient discharged from dialysis - seen leaving ED.

## 2015-07-01 NOTE — ED Notes (Signed)
Patient states that she finished dialysis last night (Friday) at 8pm.

## 2015-07-01 NOTE — ED Notes (Signed)
[name] from dialysis called - patient may go up for dialysis. MD made aware.

## 2015-07-01 NOTE — ED Notes (Signed)
PER [facility]: Patient to ED from home - stated that she needed to be rushed to the hospital because she needs dialysis. Patient has no other complaints, except for her chronic pain "everywhere." EMS VS: 134/95, ST 114, 100% RA. A&O x 4. Respirations e/u, pulses 2+ bilaterally.

## 2015-07-01 NOTE — ED Notes (Signed)
MD made aware that patient may go for dialysis tonight, will transport patient upstairs shortly.
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
## 2015-07-02 ENCOUNTER — Observation Stay (HOSPITAL_COMMUNITY): Payer: Medicare Other

## 2015-07-02 ENCOUNTER — Observation Stay (HOSPITAL_COMMUNITY)
Admission: EM | Admit: 2015-07-02 | Discharge: 2015-07-02 | Disposition: A | Discharge status: Home / Self Care | Payer: Medicare Other | Attending: Internal Medicine | Admitting: Internal Medicine

## 2015-07-02 ENCOUNTER — Encounter (HOSPITAL_COMMUNITY): Payer: Self-pay | Admitting: Emergency Medicine

## 2015-07-02 DIAGNOSIS — F17210 Nicotine dependence, cigarettes, uncomplicated: Secondary | ICD-10-CM | POA: Diagnosis not present

## 2015-07-02 DIAGNOSIS — I132 Hypertensive heart and chronic kidney disease with heart failure and with stage 5 chronic kidney disease, or end stage renal disease: Secondary | ICD-10-CM | POA: Diagnosis not present

## 2015-07-02 DIAGNOSIS — R05 Cough: Secondary | ICD-10-CM | POA: Diagnosis present

## 2015-07-02 DIAGNOSIS — Z79899 Other long term (current) drug therapy: Secondary | ICD-10-CM | POA: Insufficient documentation

## 2015-07-02 DIAGNOSIS — G8929 Other chronic pain: Secondary | ICD-10-CM | POA: Diagnosis not present

## 2015-07-02 DIAGNOSIS — M3214 Glomerular disease in systemic lupus erythematosus: Secondary | ICD-10-CM

## 2015-07-02 DIAGNOSIS — Z992 Dependence on renal dialysis: Secondary | ICD-10-CM | POA: Diagnosis not present

## 2015-07-02 DIAGNOSIS — E8770 Fluid overload, unspecified: Secondary | ICD-10-CM | POA: Diagnosis present

## 2015-07-02 DIAGNOSIS — F1910 Other psychoactive substance abuse, uncomplicated: Secondary | ICD-10-CM | POA: Diagnosis present

## 2015-07-02 DIAGNOSIS — N189 Chronic kidney disease, unspecified: Secondary | ICD-10-CM | POA: Diagnosis present

## 2015-07-02 DIAGNOSIS — R0602 Shortness of breath: Secondary | ICD-10-CM

## 2015-07-02 DIAGNOSIS — F319 Bipolar disorder, unspecified: Secondary | ICD-10-CM | POA: Diagnosis not present

## 2015-07-02 DIAGNOSIS — I509 Heart failure, unspecified: Secondary | ICD-10-CM | POA: Diagnosis not present

## 2015-07-02 DIAGNOSIS — R059 Cough, unspecified: Secondary | ICD-10-CM | POA: Diagnosis present

## 2015-07-02 DIAGNOSIS — N186 End stage renal disease: Secondary | ICD-10-CM | POA: Insufficient documentation

## 2015-07-02 DIAGNOSIS — Z720 Tobacco use: Secondary | ICD-10-CM

## 2015-07-02 DIAGNOSIS — R768 Other specified abnormal immunological findings in serum: Secondary | ICD-10-CM | POA: Diagnosis present

## 2015-07-02 DIAGNOSIS — F250 Schizoaffective disorder, bipolar type: Secondary | ICD-10-CM

## 2015-07-02 DIAGNOSIS — M329 Systemic lupus erythematosus, unspecified: Secondary | ICD-10-CM

## 2015-07-02 DIAGNOSIS — E8779 Other fluid overload: Secondary | ICD-10-CM

## 2015-07-02 DIAGNOSIS — D631 Anemia in chronic kidney disease: Secondary | ICD-10-CM

## 2015-07-02 DIAGNOSIS — I10 Essential (primary) hypertension: Secondary | ICD-10-CM | POA: Diagnosis present

## 2015-07-02 DIAGNOSIS — I120 Hypertensive chronic kidney disease with stage 5 chronic kidney disease or end stage renal disease: Secondary | ICD-10-CM | POA: Diagnosis not present

## 2015-07-02 LAB — CBC
HCT: 28.5 % — ABNORMAL LOW (ref 36.0–46.0)
HEMOGLOBIN: 8.9 g/dL — AB (ref 12.0–15.0)
MCH: 28.9 pg (ref 26.0–34.0)
MCHC: 31.2 g/dL (ref 30.0–36.0)
MCV: 92.5 fL (ref 78.0–100.0)
PLATELETS: 190 10*3/uL (ref 150–400)
RBC: 3.08 MIL/uL — ABNORMAL LOW (ref 3.87–5.11)
RDW: 21 % — AB (ref 11.5–15.5)
WBC: 10.2 10*3/uL (ref 4.0–10.5)

## 2015-07-02 LAB — RENAL FUNCTION PANEL
ALBUMIN: 3.1 g/dL — AB (ref 3.5–5.0)
ANION GAP: 12 (ref 5–15)
BUN: 61 mg/dL — ABNORMAL HIGH (ref 6–20)
CALCIUM: 8.6 mg/dL — AB (ref 8.9–10.3)
CO2: 24 mmol/L (ref 22–32)
CREATININE: 6.4 mg/dL — AB (ref 0.44–1.00)
Chloride: 102 mmol/L (ref 101–111)
GFR calc Af Amer: 9 mL/min — ABNORMAL LOW (ref >60)
GFR calc non Af Amer: 8 mL/min — ABNORMAL LOW (ref >60)
GLUCOSE: 121 mg/dL — AB (ref 65–99)
PHOSPHORUS: 6.7 mg/dL — AB (ref 2.5–4.6)
Potassium: 4.2 mmol/L (ref 3.5–5.1)
SODIUM: 138 mmol/L (ref 135–145)

## 2015-07-02 LAB — MRSA PCR SCREENING: MRSA by PCR: NEGATIVE

## 2015-07-02 LAB — INFLUENZA PANEL BY PCR (TYPE A & B)
H1N1FLUPCR: NOT DETECTED
INFLAPCR: NEGATIVE
INFLBPCR: NEGATIVE

## 2015-07-02 MED ORDER — ONDANSETRON HCL 4 MG/2ML IJ SOLN: 4.0000 mg | Freq: Four times a day (QID) | INTRAMUSCULAR | Status: DC | PRN

## 2015-07-02 MED ORDER — FUROSEMIDE 40 MG PO TABS: 120.0000 mg | ORAL_TABLET | Freq: Every day | ORAL | Status: DC

## 2015-07-02 MED ORDER — GUAIFENESIN ER 600 MG PO TB12
600.0000 mg | ORAL_TABLET | Freq: Two times a day (BID) | ORAL | Status: DC
  Administered 2015-07-02: 600 mg via ORAL
  Filled 2015-07-02: qty 1

## 2015-07-02 MED ORDER — PREDNISONE 20 MG PO TABS: 20.0000 mg | ORAL_TABLET | Freq: Every day | ORAL | Status: DC

## 2015-07-02 MED ORDER — DIPHENHYDRAMINE HCL 25 MG PO CAPS
25.0000 mg | ORAL_CAPSULE | Freq: Four times a day (QID) | ORAL | Status: AC | PRN
  Administered 2015-07-02: 25 mg via ORAL

## 2015-07-02 MED ORDER — SODIUM CHLORIDE 0.9 % IV SOLN: 100.0000 mL | INTRAVENOUS | Status: DC | PRN

## 2015-07-02 MED ORDER — HEPARIN SODIUM (PORCINE) 1000 UNIT/ML DIALYSIS
1000.0000 [IU] | INTRAMUSCULAR | Status: DC | PRN
  Administered 2015-07-02: 1000 [IU] via INTRAVENOUS_CENTRAL

## 2015-07-02 MED ORDER — DIPHENHYDRAMINE HCL 25 MG PO CAPS
ORAL_CAPSULE | ORAL | Status: AC
  Administered 2015-07-02: 25 mg via ORAL
  Filled 2015-07-02: qty 1

## 2015-07-02 MED ORDER — FLUTICASONE PROPIONATE 50 MCG/ACT NA SUSP: 2.0000 | Freq: Every day | NASAL | Status: DC

## 2015-07-02 MED ORDER — GUAIFENESIN ER 600 MG PO TB12: 600.0000 mg | ORAL_TABLET | Freq: Two times a day (BID) | ORAL | Status: DC

## 2015-07-02 MED ORDER — ACETAMINOPHEN 650 MG RE SUPP: 650.0000 mg | Freq: Four times a day (QID) | RECTAL | Status: DC | PRN

## 2015-07-02 MED ORDER — SODIUM CHLORIDE 0.9% FLUSH: 3.0000 mL | INTRAVENOUS | Status: DC | PRN

## 2015-07-02 MED ORDER — FLUTICASONE PROPIONATE 50 MCG/ACT NA SUSP
2.0000 | Freq: Every day | NASAL | Status: DC
  Administered 2015-07-02: 2 via NASAL
  Filled 2015-07-02: qty 16

## 2015-07-02 MED ORDER — ONDANSETRON HCL 4 MG PO TABS: 4.0000 mg | ORAL_TABLET | Freq: Four times a day (QID) | ORAL | Status: DC | PRN

## 2015-07-02 MED ORDER — LORATADINE 10 MG PO TABS: 10.0000 mg | ORAL_TABLET | Freq: Every day | ORAL | Status: DC

## 2015-07-02 MED ORDER — HEPARIN SODIUM (PORCINE) 5000 UNIT/ML IJ SOLN: 5000.0000 [IU] | Freq: Three times a day (TID) | INTRAMUSCULAR | Status: DC

## 2015-07-02 MED ORDER — HEPARIN SODIUM (PORCINE) 1000 UNIT/ML IJ SOLN
INTRAMUSCULAR | Status: AC
  Administered 2015-07-02: 1000 [IU] via INTRAVENOUS_CENTRAL
  Filled 2015-07-02: qty 3

## 2015-07-02 MED ORDER — SENNOSIDES-DOCUSATE SODIUM 8.6-50 MG PO TABS: 1.0000 | ORAL_TABLET | Freq: Every evening | ORAL | Status: DC | PRN

## 2015-07-02 MED ORDER — FUROSEMIDE 80 MG PO TABS: 120.0000 mg | ORAL_TABLET | Freq: Every day | ORAL | Status: DC

## 2015-07-02 MED ORDER — SODIUM CHLORIDE 0.9% FLUSH: 3.0000 mL | Freq: Two times a day (BID) | INTRAVENOUS | Status: DC

## 2015-07-02 MED ORDER — LIDOCAINE HCL (PF) 1 % IJ SOLN
INTRAMUSCULAR | Status: AC
  Administered 2015-07-02: 0.5 mL via INTRADERMAL
  Filled 2015-07-02: qty 5

## 2015-07-02 MED ORDER — ACETAMINOPHEN 325 MG PO TABS: 650.0000 mg | ORAL_TABLET | Freq: Four times a day (QID) | ORAL | Status: DC | PRN

## 2015-07-02 MED ORDER — METOPROLOL TARTRATE 50 MG PO TABS: 50.0000 mg | ORAL_TABLET | Freq: Two times a day (BID) | ORAL | Status: DC

## 2015-07-02 MED ORDER — SORBITOL 70 % SOLN: 30.0000 mL | Freq: Every day | Status: DC | PRN

## 2015-07-02 MED ORDER — GUAIFENESIN-DM 100-10 MG/5ML PO SYRP: 5.0000 mL | ORAL_SOLUTION | ORAL | Status: DC | PRN

## 2015-07-02 MED ORDER — SODIUM CHLORIDE 0.9% FLUSH
3.0000 mL | Freq: Two times a day (BID) | INTRAVENOUS | Status: DC
  Administered 2015-07-02: 3 mL via INTRAVENOUS

## 2015-07-02 MED ORDER — SODIUM CHLORIDE 0.9 % IV SOLN: 250.0000 mL | INTRAVENOUS | Status: DC | PRN

## 2015-07-02 MED ORDER — IPRATROPIUM-ALBUTEROL 0.5-2.5 (3) MG/3ML IN SOLN: 3.0000 mL | RESPIRATORY_TRACT | Status: DC | PRN

## 2015-07-02 MED ORDER — LIDOCAINE HCL (PF) 1 % IJ SOLN
5.0000 mL | INTRAMUSCULAR | Status: DC | PRN
  Administered 2015-07-02: 0.5 mL via INTRADERMAL

## 2015-07-02 MED ORDER — LORATADINE 10 MG PO TABS
10.0000 mg | ORAL_TABLET | Freq: Every day | ORAL | Status: DC
  Administered 2015-07-02: 10 mg via ORAL
  Filled 2015-07-02: qty 1

## 2015-07-02 MED ORDER — PENTAFLUOROPROP-TETRAFLUOROETH EX AERO: 1.0000 "application " | INHALATION_SPRAY | CUTANEOUS | Status: DC | PRN

## 2015-07-02 MED ORDER — LIDOCAINE-PRILOCAINE 2.5-2.5 % EX CREA: 1.0000 "application " | TOPICAL_CREAM | CUTANEOUS | Status: DC | PRN

## 2015-07-02 MED ORDER — TRAMADOL HCL 50 MG PO TABS
50.0000 mg | ORAL_TABLET | Freq: Two times a day (BID) | ORAL | Status: DC | PRN
  Administered 2015-07-02: 50 mg via ORAL
  Filled 2015-07-02: qty 1
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
## 2015-07-02 NOTE — ED Notes (Signed)
Dialysis admit.
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
## 2015-07-02 NOTE — Discharge Summary (Signed)
Physician Discharge Summary  BOBBI CLAES [id number] DOB: October 06, 1980 DOA: 07/02/2015  PCP: No PCP Per Patient  Admit date: 07/02/2015 Discharge date: 07/02/2015  Time spent: 60 minutes  Recommendations for Outpatient Follow-up:  1. Follow-up with PCP in 1 week. 2. Follow-up for next hemodialysis session on Wednesday, 07/04/2015.   Discharge Diagnoses:  Principal Problem:   ESRD on dialysis [facility]) Active Problems:   Cough   HTN (hypertension)   Positive ANA (antinuclear antibody)   Lupus nephritis (HCC)   Tobacco abuse   Schizoaffective disorder, bipolar type ([place])   Polysubstance abuse   Chronic pain   Hypervolemia   Anemia in end-stage renal disease (HCC)   Systemic lupus (HCC)   ESRD needing dialysis [facility])   Chronic renal failure   Discharge Condition: Stable and improved  Diet recommendation: Heart healthy  Filed Weights   07/02/15 0904 07/02/15 1140 07/02/15 1455  Weight: 90.493 kg (199 lb 8 oz) 89.6 kg (197 lb 8.5 oz) 89.6 kg (197 lb 8.5 oz)    History of present illness:  With history of end-stage renal disease on hemodialysis Monday Wednesday Friday who is noncompliant with treatment recommendations, history of lupus number CHF, hypertension, lupus nephritis who presented to the ED for her dialysis treatment. Patient states she usually either goes to [place] for dialysis treatments on Monday Wednesdays and Fridays. Patient states was admitted to [facility] 2 days prior to admission for an extra dialysis treatment as she stated that she got significantly short of breath and was wheezing. Patient stated that 3-1/2 L were removed she felt better and was subsequently discharged. Patient complaining of 3-4 day episode of congestion, runny nose, productive cough of yellowish sputum. Patient also had a bout of emesis the night prior to admission and nausea. Patient also endorses some shortness of breath. Patient endorses worsening lower extremity edema.  Patient denies any fevers, no chills, no chest pain, no abdominal pain, no melena, no hematemesis, no hematochezia, no diarrhea, no constipation, no dysuria. Patient states she does make urine. Patient stated that she's run out of her diuretics and has subsequently not been taking her Lasix. Patient was seen in the emergency room nephrology was consulted for hemodialysis and hospice were consulted to admit the patient.   Hospital Course:  #1 end-stage renal disease on hemodialysis/lupus nephritis Patient states usually gets hemodialysis Monday Wednesday Friday are seen at [facility] or [facility] and presented for hemodialysis. Patient had presented with with some complaints of shortness of breath and increased weight gain as well as lower extremity edema. Patient also with some complaints of wheezing. Patient with complaints of upper respiratory symptoms. Renal panel obtained at a BUN of 61 creatinine of 6.40. Phosphorus level was 6.7. CBC obtained at a white count of 10.2 hemoglobin of 8.9. Chest x-ray which was done was negative for any acute infiltrate. Influenza PCR was ordered and was pending at time of discharge. Patient was placed back on home regimen of Lasix and her prednisone dose was decreased back to her prior regimen of 20 mg daily. Nephrology was consulted and patient underwent hemodialysis during this hospitalization. Patient be discharged home in stable condition.   #2 cough Patient presented with a productive cough of yellowish sputum up her respiratory symptoms with congestion and shortness of breath. Chest x-ray which was that was negative for any acute infiltrate. Influenza PCR was pending at time of discharge as patient was adamant on being discharged post hemodialysis. Patient was  placed on Claritin, Flonase and Mucinex. Outpatient follow-up.   #3 history of lupus Decreased prednisone back to 20 mg daily.  #4 tobacco abuse Tobacco cessation.  #5 anemia of end-stage  renal disease Hemoglobin remained stable.   #6 schizoaffective disorder/bipolar disorder Stable. Not on any medications at this time. Outpatient follow-up.  #7 hypervolemia Likely secondary to problem #1. Patient also states has been out of her Lasix. Patient was placed back on a home regimen of Lasix 120 mg daily. Patient was seen by nephrology and underwent hemodialysis. Patient was insistent on being discharged home postdialysis. Patient was given a prescription for Lasix.    Procedures:  Chest x-ray 07/02/2015  Consultations:  Nephrology: Dr. Hinda Lenis 07/02/2015  Discharge Exam: Filed Vitals:   07/02/15 1800 07/02/15 1830  BP: 141/78 133/74  Pulse: 110 108  Temp:    Resp:      General: NAD Cardiovascular: RRR Respiratory: Scattered Coarse BS.  Discharge Instructions   Discharge Instructions    Diet - low sodium heart healthy    Complete by:  As directed      Increase activity slowly    Complete by:  As directed           Current Discharge Medication List    START taking these medications   Details  fluticasone (FLONASE) 50 MCG/ACT nasal spray Place 2 sprays into both nostrils daily. Use for 5 days then stop. Qty: 16 g, Refills: 0    guaiFENesin (MUCINEX) 600 MG 12 hr tablet Take 1 tablet (600 mg total) by mouth 2 (two) times daily. Take for 4 days then stop. Qty: 8 tablet, Refills: 0    loratadine (CLARITIN) 10 MG tablet Take 1 tablet (10 mg total) by mouth daily. Take daily. Qty: 20 tablet, Refills: 0      CONTINUE these medications which have CHANGED   Details  furosemide (LASIX) 40 MG tablet Take 3 tablets (120 mg total) by mouth daily. Qty: 90 tablet, Refills: 0    metoprolol (LOPRESSOR) 50 MG tablet Take 1 tablet (50 mg total) by mouth 2 (two) times daily. Qty: 60 tablet, Refills: 0    predniSONE (DELTASONE) 20 MG tablet Take 1 tablet (20 mg total) by mouth daily with breakfast.      CONTINUE these medications which have NOT CHANGED    Details  acetaminophen (TYLENOL) 500 MG tablet Take 1,000 mg by mouth every 6 (six) hours as needed for moderate pain. Reported on 06/12/2015    traMADol (ULTRAM) 50 MG tablet Take 50 mg by mouth every 12 (twelve) hours as needed for moderate pain or severe pain.        Allergies  Allergen Reactions  . Ativan [Lorazepam] Other (See Comments)    Dysarthria(patient has difficulty speaking and slurred speech); denies swelling, itching, pain, or numbness.  [name] [Ziprasidone Hcl] Itching and Swelling    Tongue swelling  . Keflex [Cephalexin] Swelling and Other (See Comments)    Tongue swelling. Can't talk   . Haldol [Haloperidol Lactate] Swelling    Tongue swelling. 05/31/15 - MD ok with giving as pt has tolerated in the past Pt can take benadryl.  . Other Itching    wool   Follow-up Information    Please follow up.   Why:  f/u PCP in 1 week.       The results of significant diagnostics from this hospitalization (including imaging, microbiology, ancillary and laboratory) are listed below for reference.    Significant Diagnostic Studies: Dg Chest  2 View  07/02/2015  CLINICAL DATA:  Shortness of breath for 5 days. Cough. History of renal failure EXAM: CHEST  2 VIEW COMPARISON:  June 14, 2015 FINDINGS: There is no edema or consolidation. There is moderate cardiac enlargement. The pulmonary vascular is normal. No adenopathy. No bone lesions. IMPRESSION: Moderate cardiac enlargement. Question underlying pericardial effusion. No edema or consolidation. Electronically Signed   By: Lowella Grip III M.D.   On: 07/02/2015 11:28   Dg Chest 2 View  06/12/2015  CLINICAL DATA:  Shortness of breath for 5 days. EXAM: CHEST  2 VIEW COMPARISON:  Radiographs 06/08/2015, multiple priors. FINDINGS: Cardiomegaly is stable. Vascular congestion again seen. Small pleural effusions, with minimal fluid in the minor fissure. No confluent airspace disease or pneumothorax. Resorption of both distal clavicle  again seen. IMPRESSION: Chronic cardiomegaly, vascular congestion, and small pleural effusions. Electronically Signed   By: Jeb Levering M.D.   On: 06/12/2015 00:07   Dg Chest 2 View  06/04/2015  CLINICAL DATA:  Shortness of breath.  Chronic renal failure. EXAM: CHEST  2 VIEW COMPARISON:  06/02/2015 and 05/27/2015 FINDINGS: There is chronic cardiomegaly. There is slight pulmonary vascular congestion which has improved since the prior study. Tiny bilateral pleural effusions, unchanged. No acute osseous abnormality. Slight thoracolumbar scoliosis. IMPRESSION: Chronic cardiomegaly and pulmonary vascular congestion, slightly improved. Stable tiny effusions. Electronically Signed   By: Lorriane Shire M.D.   On: 06/04/2015 15:38   Dg Chest 2 View  06/02/2015  CLINICAL DATA:  Patient with productive cough for 2 days. EXAM: CHEST  2 VIEW COMPARISON:  Chest radiograph 05/27/2015. FINDINGS: Marked cardiomegaly. Pulmonary vascular redistribution. No large area of pulmonary consolidation. No definite pleural effusion or pneumothorax. Re- demonstrated osteolysis of the distal clavicles. IMPRESSION: Cardiomegaly without acute cardiopulmonary process. Re- demonstrated osteolysis of the distal clavicles. Electronically Signed   By: Lovey Newcomer M.D.   On: 06/02/2015 19:36   Dg Chest Portable 1 View  06/14/2015  CLINICAL DATA:  Increased lower extremity edema in shortness of breath, dialysis patient EXAM: PORTABLE CHEST 1 VIEW COMPARISON:  06/11/2015 FINDINGS: Severe cardiac enlargement stable. Central vascular congestion. Small right pleural effusion intercalating into the minor fissure. Probable tiny left effusion. No definite pulmonary edema. IMPRESSION: Chronic cardiac enlargement and vascular congestion with small pleural effusions Electronically Signed   By: Skipper Cliche M.D.   On: 06/14/2015 17:39   Dg Chest Portable 1 View  06/08/2015  CLINICAL DATA:  Acute onset of shortness of breath. Initial encounter.  EXAM: PORTABLE CHEST 1 VIEW COMPARISON:  Chest radiograph performed 06/04/2015 FINDINGS: The lungs are well-aerated. Vascular congestion is noted. Mildly increased interstitial markings may reflect mild interstitial edema or possibly pneumonia. There is no evidence of pleural effusion or pneumothorax. The cardiomediastinal silhouette is enlarged. No acute osseous abnormalities are seen. IMPRESSION: Vascular congestion and cardiomegaly. Mildly increased interstitial markings may reflect mild interstitial edema or possibly pneumonia. Electronically Signed   By: Garald Balding M.D.   On: 06/08/2015 05:11    Microbiology: No results found for this or any previous visit (from the past 240 hour(s)).   Labs: Basic Metabolic Panel:  Recent Labs Lab 06/27/15 0848 06/29/15 0921 06/29/15 1532 07/02/15 1013  NA 139 138 134* 138  K 4.2 4.6 5.3* 4.2  CL 102 99* 100* 102  CO2  --   --  23 24  GLUCOSE 99 78 99 121*  BUN 70* 69* 82* 61*  CREATININE 8.90* 8.60* 8.04* 6.40*  CALCIUM  --   --  8.4* 8.6*  PHOS  --   --  6.6* 6.7*   Liver Function Tests:  Recent Labs Lab 06/29/15 1532 07/02/15 1013  ALBUMIN 3.1* 3.1*   No results for input(s): LIPASE, AMYLASE in the last 168 hours. No results for input(s): AMMONIA in the last 168 hours. CBC:  Recent Labs Lab 06/27/15 0848 06/29/15 0921 06/29/15 1532 07/02/15 1013  WBC  --   --  9.9 10.2  HGB 9.9* 9.9* 8.1* 8.9*  HCT 29.0* 29.0* 25.8* 28.5*  MCV  --   --  89.9 92.5  PLT  --   --  191 190   Cardiac Enzymes: No results for input(s): CKTOTAL, CKMB, CKMBINDEX, TROPONINI in the last 168 hours. BNP: BNP (last 3 results)  Recent Labs  09/11/14 0815  BNP 1694.9*    ProBNP (last 3 results) No results for input(s): PROBNP in the last 8760 hours.  CBG: No results for input(s): GLUCAP in the last 168 hours.     SignedIrine Seal MD.  [facility] 07/02/2015, 6:47 PM

## 2015-07-02 NOTE — Procedures (Signed)
   HEMODIALYSIS TREATMENT NOTE:  4 hour low-heparin dialysis completed via right upper arm AVF (15g ante/retrograde). Goal met: 4 liters removed without interruption in ultrafiltration. All blood was returned and hemostasis was achieved within 10 minutes. Importance of sodium and fluid restriction was reinforced again. Pt last dialyzed at [facility] on Friday (3/24).  She presented to [facility] ED via EMS on Saturday 3/25 and was dialyzed in the early hours of Sunday 3/26.  Her weights reflected a 4.2kg gain in just over 24 hours.  Between Sunday's treatment and today's, she gained 5.1kg.  Pt was encouraged to try sugar-free candy, frozen grapes, ice chips as alternatives to po liquids.  Hemodynamically stable post-HD.  Prescriptions given, PIV was removed/intact, and pt ambulated with RN escort to main entrance where she was collected by her mother.   Rockwell Alexandria, RN, CDN

## 2015-07-04 ENCOUNTER — Observation Stay (HOSPITAL_COMMUNITY)
Admission: EM | Admit: 2015-07-04 | Discharge: 2015-07-04 | Disposition: A | Discharge status: Home / Self Care | Payer: Medicare Other | Attending: Internal Medicine | Admitting: Internal Medicine

## 2015-07-04 ENCOUNTER — Encounter (HOSPITAL_COMMUNITY): Payer: Self-pay | Admitting: *Deleted

## 2015-07-04 DIAGNOSIS — Z79899 Other long term (current) drug therapy: Secondary | ICD-10-CM | POA: Diagnosis not present

## 2015-07-04 DIAGNOSIS — I5022 Chronic systolic (congestive) heart failure: Secondary | ICD-10-CM

## 2015-07-04 DIAGNOSIS — I132 Hypertensive heart and chronic kidney disease with heart failure and with stage 5 chronic kidney disease, or end stage renal disease: Secondary | ICD-10-CM | POA: Diagnosis not present

## 2015-07-04 DIAGNOSIS — I509 Heart failure, unspecified: Secondary | ICD-10-CM | POA: Diagnosis not present

## 2015-07-04 DIAGNOSIS — I3139 Other pericardial effusion (noninflammatory): Secondary | ICD-10-CM | POA: Diagnosis present

## 2015-07-04 DIAGNOSIS — F17210 Nicotine dependence, cigarettes, uncomplicated: Secondary | ICD-10-CM | POA: Diagnosis not present

## 2015-07-04 DIAGNOSIS — I120 Hypertensive chronic kidney disease with stage 5 chronic kidney disease or end stage renal disease: Secondary | ICD-10-CM | POA: Diagnosis not present

## 2015-07-04 DIAGNOSIS — F209 Schizophrenia, unspecified: Secondary | ICD-10-CM | POA: Insufficient documentation

## 2015-07-04 DIAGNOSIS — E8770 Fluid overload, unspecified: Secondary | ICD-10-CM | POA: Diagnosis present

## 2015-07-04 DIAGNOSIS — D631 Anemia in chronic kidney disease: Secondary | ICD-10-CM

## 2015-07-04 DIAGNOSIS — E8779 Other fluid overload: Secondary | ICD-10-CM

## 2015-07-04 DIAGNOSIS — Z791 Long term (current) use of non-steroidal anti-inflammatories (NSAID): Secondary | ICD-10-CM | POA: Diagnosis not present

## 2015-07-04 DIAGNOSIS — I10 Essential (primary) hypertension: Secondary | ICD-10-CM | POA: Diagnosis not present

## 2015-07-04 DIAGNOSIS — N186 End stage renal disease: Secondary | ICD-10-CM | POA: Diagnosis not present

## 2015-07-04 DIAGNOSIS — I319 Disease of pericardium, unspecified: Secondary | ICD-10-CM

## 2015-07-04 DIAGNOSIS — F319 Bipolar disorder, unspecified: Secondary | ICD-10-CM | POA: Diagnosis not present

## 2015-07-04 DIAGNOSIS — I313 Pericardial effusion (noninflammatory): Secondary | ICD-10-CM | POA: Diagnosis present

## 2015-07-04 DIAGNOSIS — F250 Schizoaffective disorder, bipolar type: Secondary | ICD-10-CM

## 2015-07-04 DIAGNOSIS — Z992 Dependence on renal dialysis: Secondary | ICD-10-CM

## 2015-07-04 DIAGNOSIS — M329 Systemic lupus erythematosus, unspecified: Secondary | ICD-10-CM | POA: Diagnosis present

## 2015-07-04 LAB — BASIC METABOLIC PANEL
ANION GAP: 13 (ref 5–15)
BUN: 66 mg/dL — ABNORMAL HIGH (ref 6–20)
CALCIUM: 9 mg/dL (ref 8.9–10.3)
CO2: 26 mmol/L (ref 22–32)
Chloride: 101 mmol/L (ref 101–111)
Creatinine, Ser: 7.22 mg/dL — ABNORMAL HIGH (ref 0.44–1.00)
GFR calc non Af Amer: 7 mL/min — ABNORMAL LOW (ref >60)
GFR, EST AFRICAN AMERICAN: 8 mL/min — AB (ref >60)
GLUCOSE: 98 mg/dL (ref 65–99)
POTASSIUM: 4.7 mmol/L (ref 3.5–5.1)
SODIUM: 140 mmol/L (ref 135–145)

## 2015-07-04 MED ORDER — ACETAMINOPHEN 325 MG PO TABS: 650.0000 mg | ORAL_TABLET | Freq: Four times a day (QID) | ORAL | Status: DC | PRN

## 2015-07-04 MED ORDER — LEVALBUTEROL HCL 0.63 MG/3ML IN NEBU
0.6300 mg | INHALATION_SOLUTION | Freq: Three times a day (TID) | RESPIRATORY_TRACT | Status: DC
  Filled 2015-07-04: qty 3

## 2015-07-04 MED ORDER — SODIUM CHLORIDE 0.9 % IV SOLN: 100.0000 mL | INTRAVENOUS | Status: DC | PRN

## 2015-07-04 MED ORDER — ENOXAPARIN SODIUM 30 MG/0.3ML ~~LOC~~ SOLN: 30.0000 mg | SUBCUTANEOUS | Status: DC

## 2015-07-04 MED ORDER — ONDANSETRON HCL 4 MG/2ML IJ SOLN: 4.0000 mg | Freq: Four times a day (QID) | INTRAMUSCULAR | Status: DC | PRN

## 2015-07-04 MED ORDER — ONDANSETRON HCL 4 MG PO TABS: 4.0000 mg | ORAL_TABLET | Freq: Four times a day (QID) | ORAL | Status: DC | PRN

## 2015-07-04 MED ORDER — ALBUTEROL SULFATE (2.5 MG/3ML) 0.083% IN NEBU: 2.5000 mg | INHALATION_SOLUTION | RESPIRATORY_TRACT | Status: DC | PRN

## 2015-07-04 MED ORDER — DIPHENHYDRAMINE HCL 25 MG PO CAPS
ORAL_CAPSULE | ORAL | Status: AC
  Administered 2015-07-04: 25 mg via ORAL
  Filled 2015-07-04: qty 1

## 2015-07-04 MED ORDER — DOCUSATE SODIUM 100 MG PO CAPS: 100.0000 mg | ORAL_CAPSULE | Freq: Two times a day (BID) | ORAL | Status: DC

## 2015-07-04 MED ORDER — PENTAFLUOROPROP-TETRAFLUOROETH EX AERO: 1.0000 "application " | INHALATION_SPRAY | CUTANEOUS | Status: DC | PRN

## 2015-07-04 MED ORDER — FLUTICASONE PROPIONATE 50 MCG/ACT NA SUSP
2.0000 | Freq: Every day | NASAL | Status: DC
  Administered 2015-07-04: 2 via NASAL
  Filled 2015-07-04: qty 16

## 2015-07-04 MED ORDER — LIDOCAINE-PRILOCAINE 2.5-2.5 % EX CREA: 1.0000 "application " | TOPICAL_CREAM | CUTANEOUS | Status: DC | PRN

## 2015-07-04 MED ORDER — ACETAMINOPHEN 650 MG RE SUPP: 650.0000 mg | Freq: Four times a day (QID) | RECTAL | Status: DC | PRN

## 2015-07-04 MED ORDER — HEPARIN SODIUM (PORCINE) 1000 UNIT/ML DIALYSIS
20.0000 [IU]/kg | INTRAMUSCULAR | Status: DC | PRN
  Administered 2015-07-04: 1800 [IU] via INTRAVENOUS_CENTRAL

## 2015-07-04 MED ORDER — METOPROLOL TARTRATE 50 MG PO TABS: 50.0000 mg | ORAL_TABLET | Freq: Two times a day (BID) | ORAL | Status: DC

## 2015-07-04 MED ORDER — TRAMADOL HCL 50 MG PO TABS
50.0000 mg | ORAL_TABLET | Freq: Two times a day (BID) | ORAL | Status: DC | PRN
  Administered 2015-07-04: 50 mg via ORAL
  Filled 2015-07-04 (×2): qty 1

## 2015-07-04 MED ORDER — DIPHENHYDRAMINE HCL 25 MG PO CAPS
25.0000 mg | ORAL_CAPSULE | Freq: Four times a day (QID) | ORAL | Status: DC | PRN
  Administered 2015-07-04: 25 mg via ORAL

## 2015-07-04 MED ORDER — LIDOCAINE HCL (PF) 1 % IJ SOLN
5.0000 mL | INTRAMUSCULAR | Status: DC | PRN
  Administered 2015-07-04: 0.5 mL via INTRADERMAL

## 2015-07-04 MED ORDER — PREDNISONE 20 MG PO TABS: 20.0000 mg | ORAL_TABLET | Freq: Every day | ORAL | Status: DC

## 2015-07-04 MED ORDER — LORATADINE 10 MG PO TABS: 10.0000 mg | ORAL_TABLET | Freq: Every day | ORAL | Status: DC

## 2015-07-04 NOTE — ED Notes (Addendum)
Pt into triage, to be seen by Dr. Theador Hawthorne, per his request.

## 2015-07-04 NOTE — Discharge Summary (Signed)
Physician Discharge Summary  Sara [name] [id number] DOB: 11-09-1980 DOA: 06/20/2015  PCP: No PCP Per Patient  Admit date: 06/20/2015 Discharge date: 07/04/2015  Time spent: 25 minutes Patient left AGAINST MEDICAL ADVICE. Recommendations for Outpatient Follow-up:  1. None   Discharge Diagnoses:  Principal Problem:   ESRD needing dialysis [facility]) Active Problems:   Lupus nephritis ([place])   Essential hypertension   Schizoaffective disorder, bipolar type ([place])   Anemia in end-stage renal disease (HCC)   Skin excoriation   Systemic lupus (HCC)   Volume overload   Discharge Condition: stable   Diet recommendation: renal  Filed Weights   06/22/15 0903 06/23/15 0730 06/23/15 1200  Weight: 84.233 kg (185 lb 11.2 oz) 87 kg (191 lb 12.8 oz) 87 kg (191 lb 12.8 oz)    History of present illness:  82 yof with a hx of ESRD on hemodialysis with noncompliance, last hemodialysis March 10. Presented to the emergency department with increasing shortness of breath and massive swelling in the lower extremities. Exam revealed massive volume overload and plans were made for hemodialysis.   Hospital Course:  Dyspnea due to volume overload due to noncompliance/ ESRD needing dialysis [facility]): She came in massively overload and renal was consulted who recommended dialysis daily Continue strict I's and O's and daily weights. Need to have tight control of her intakes. Continue strict I's and O's Patient informed and O's and her breathing was better so she wanted to leave his medical device.  History of lupus nephritis: Continue current dose of prednisone.  Bipolar disorder/schizophrenia/schizoaffective disorder: Currently stable follow-up with psychiatrist and outpatient.  Essential hypertension: Blood pressures the [facility] no changes were made to her regimen.  Anemia in end-stage renal disease ([place]) Further management per  renal.  Procedures:  CXR  Consultations:  renal  Discharge Exam: Filed Vitals:   06/23/15 1640 06/23/15 1656  BP: 132/84 128/74  Pulse: 97 96  Temp:    Resp:      General: A&O x3 Cardiovascular: RRR Respiratory: good air movement CTA B/L  Discharge Instructions    Discharge Medication List as of 06/23/2015  5:38 PM    CONTINUE these medications which have NOT CHANGED   Details  acetaminophen (TYLENOL) 500 MG tablet Take 1,000 mg by mouth every 6 (six) hours as needed for moderate pain. Reported on 06/12/2015, Until Discontinued, Historical Med    traMADol (ULTRAM) 50 MG tablet Take 50 mg by mouth every 12 (twelve) hours as needed for moderate pain or severe pain. , Starting 06/05/2015, Until Discontinued, Historical Med    aspirin 325 MG tablet Take 325-650 mg by mouth every 6 (six) hours as needed for headache. Reported on 06/12/2015, Until Discontinued, Historical Med    furosemide (LASIX) 40 MG tablet Take 3 tablets (120 mg total) by mouth daily., Starting 06/15/2015, Until Discontinued, Print    metoprolol (LOPRESSOR) 50 MG tablet Take 1 tablet (50 mg total) by mouth 2 (two) times daily., Starting 01/01/2015, Until Discontinued, Print    naproxen sodium (ANAPROX) 220 MG tablet Take 220 mg by mouth daily as needed (for pain). Reported on 06/12/2015, Until Discontinued, Historical Med    predniSONE (DELTASONE) 20 MG tablet Take 20 mg by mouth daily with breakfast., Until Discontinued, Historical Med    triamcinolone (KENALOG) 0.025 % ointment APPLY 1 APPLICATION TOPICALLY 2 (TWO) TIMES DAILY., Normal       Allergies  Allergen Reactions  . Ativan [Lorazepam] Other (See Comments)    Dysarthria(patient has difficulty speaking and slurred  speech); denies swelling, itching, pain, or numbness.  [name] [Ziprasidone Hcl] Itching and Swelling    Tongue swelling  . Keflex [Cephalexin] Swelling and Other (See Comments)    Tongue swelling. Can't talk   . Haldol [Haloperidol  Lactate] Swelling    Tongue swelling. 05/31/15 - MD ok with giving as pt has tolerated in the past Pt can take benadryl.  . Other Itching    wool      The results of significant diagnostics from this hospitalization (including imaging, microbiology, ancillary and laboratory) are listed below for reference.    Significant Diagnostic Studies: Dg Chest 2 View  07/02/2015  CLINICAL DATA:  Shortness of breath for 5 days. Cough. History of renal failure EXAM: CHEST  2 VIEW COMPARISON:  June 14, 2015 FINDINGS: There is no edema or consolidation. There is moderate cardiac enlargement. The pulmonary vascular is normal. No adenopathy. No bone lesions. IMPRESSION: Moderate cardiac enlargement. Question underlying pericardial effusion. No edema or consolidation. Electronically Signed   By: Lowella Grip III M.D.   On: 07/02/2015 11:28   Dg Chest 2 View  06/12/2015  CLINICAL DATA:  Shortness of breath for 5 days. EXAM: CHEST  2 VIEW COMPARISON:  Radiographs 06/08/2015, multiple priors. FINDINGS: Cardiomegaly is stable. Vascular congestion again seen. Small pleural effusions, with minimal fluid in the minor fissure. No confluent airspace disease or pneumothorax. Resorption of both distal clavicle again seen. IMPRESSION: Chronic cardiomegaly, vascular congestion, and small pleural effusions. Electronically Signed   By: Jeb Levering M.D.   On: 06/12/2015 00:07   Dg Chest 2 View  06/04/2015  CLINICAL DATA:  Shortness of breath.  Chronic renal failure. EXAM: CHEST  2 VIEW COMPARISON:  06/02/2015 and 05/27/2015 FINDINGS: There is chronic cardiomegaly. There is slight pulmonary vascular congestion which has improved since the prior study. Tiny bilateral pleural effusions, unchanged. No acute osseous abnormality. Slight thoracolumbar scoliosis. IMPRESSION: Chronic cardiomegaly and pulmonary vascular congestion, slightly improved. Stable tiny effusions. Electronically Signed   By: Lorriane Shire M.D.   On:  06/04/2015 15:38   Dg Chest Portable 1 View  06/14/2015  CLINICAL DATA:  Increased lower extremity edema in shortness of breath, dialysis patient EXAM: PORTABLE CHEST 1 VIEW COMPARISON:  06/11/2015 FINDINGS: Severe cardiac enlargement stable. Central vascular congestion. Small right pleural effusion intercalating into the minor fissure. Probable tiny left effusion. No definite pulmonary edema. IMPRESSION: Chronic cardiac enlargement and vascular congestion with small pleural effusions Electronically Signed   By: Skipper Cliche M.D.   On: 06/14/2015 17:39   Dg Chest Portable 1 View  06/08/2015  CLINICAL DATA:  Acute onset of shortness of breath. Initial encounter. EXAM: PORTABLE CHEST 1 VIEW COMPARISON:  Chest radiograph performed 06/04/2015 FINDINGS: The lungs are well-aerated. Vascular congestion is noted. Mildly increased interstitial markings may reflect mild interstitial edema or possibly pneumonia. There is no evidence of pleural effusion or pneumothorax. The cardiomediastinal silhouette is enlarged. No acute osseous abnormalities are seen. IMPRESSION: Vascular congestion and cardiomegaly. Mildly increased interstitial markings may reflect mild interstitial edema or possibly pneumonia. Electronically Signed   By: Garald Balding M.D.   On: 06/08/2015 05:11    Microbiology: Recent Results (from the past 240 hour(s))  MRSA PCR Screening     Status: None   Collection Time: 07/02/15 12:00 PM  Result Value Ref Range Status   MRSA by PCR NEGATIVE NEGATIVE Final    Comment:        The GeneXpert MRSA Assay (FDA approved for NASAL specimens  only), is one component of a comprehensive MRSA colonization surveillance program. It is not intended to diagnose MRSA infection nor to guide or monitor treatment for MRSA infections.      Labs: Basic Metabolic Panel:  Recent Labs Lab 06/29/15 0921 06/29/15 1532 07/02/15 1013  NA 138 134* 138  K 4.6 5.3* 4.2  CL 99* 100* 102  CO2  --  23 24   GLUCOSE 78 99 121*  BUN 69* 82* 61*  CREATININE 8.60* 8.04* 6.40*  CALCIUM  --  8.4* 8.6*  PHOS  --  6.6* 6.7*   Liver Function Tests:  Recent Labs Lab 06/29/15 1532 07/02/15 1013  ALBUMIN 3.1* 3.1*   No results for input(s): LIPASE, AMYLASE in the last 168 hours. No results for input(s): AMMONIA in the last 168 hours. CBC:  Recent Labs Lab 06/29/15 0921 06/29/15 1532 07/02/15 1013  WBC  --  9.9 10.2  HGB 9.9* 8.1* 8.9*  HCT 29.0* 25.8* 28.5*  MCV  --  89.9 92.5  PLT  --  191 190   Cardiac Enzymes: No results for input(s): CKTOTAL, CKMB, CKMBINDEX, TROPONINI in the last 168 hours. BNP: BNP (last 3 results)  Recent Labs  09/11/14 0815  BNP 1694.9*    ProBNP (last 3 results) No results for input(s): PROBNP in the last 8760 hours.  CBG: No results for input(s): GLUCAP in the last 168 hours.    Signed:  Charlynne Cousins MD.  [facility] 07/04/2015, 1:08 PM

## 2015-07-04 NOTE — ED Notes (Signed)
Pt needs dialysis. Last dialyzed on Monday.
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
## 2015-07-04 NOTE — Procedures (Signed)
   HEMODIALYSIS TREATMENT NOTE:  4 hour low-heparin dialysis completed via right upper arm AVF (15g ante/retrograde). Goal met: 4 liters removed without interruption in ultrafiltration.  All blood returned and hemostasis achieved within 10 minutes. Report given to Birdie Hopes, RN.  Fluid and sodium restriction were encouraged as pt had gained 6.4kg since last HD session. Weight loss this session: 3.8kg (post weight 88kg)  Rockwell Alexandria, RN, CDN

## 2015-07-04 NOTE — Consult Note (Signed)
Sara [name] MRN: [id number] DOB/AGE: January 01, 1981 35 y.o. Primary Care Physician:No PCP Per Patient Admit date: 07/04/2015 Chief Complaint:  Chief Complaint  Patient presents with  . needs dialysis    HPI: Pt is 35 year old female with past medical hx of ESRD who was admitted with c/o dyspnea," I need dialysis "  HPI dates back to few days ago started feeling short of breath,and so pt came to ER asking for her dialysis. Pt had her dialysis tx on last Monday. Pt says " I am 14kgs over my dry weight, I need my dialysis ". NO c/o chest pain No c/o fever/cough/chills NO c/o nausea/vomiting/ diarrhea  no c/o hematuria I was called by ER to come see the pt as Pt main concern is "I need my dialysis"  Pt seen on for need of renal replacement therapy.  Past Medical History  Diagnosis Date  . Lupus ([place])     lupus w nephritis  . History of blood transfusion     "this is probably my 3rd" (10/09/2014)  . ESRD (end stage renal disease) on dialysis [facility])     "[name]; [facility]" (10/09/2014)  . Bipolar disorder ([place])     Sara [name] 10/09/2014  . Schizophrenia ([place])     Sara [name] 10/09/2014  . Chronic anemia     Sara [name] 10/09/2014  . CHF (congestive heart failure) ([place])     systolic/notes [id number]  . Acute myopericarditis     hx/notes 10/09/2014  . Psychosis   . Hypertension   . Pregnancy   . Low back pain   . Positive ANA (antinuclear antibody) 08/16/2012  . 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 Tobacco abuse 02/20/2014  . Schizoaffective disorder, bipolar type ([place]) 11/20/2014  . Non-compliant patient         Family History  Problem Relation Age of Onset  . Drug abuse Father   . Kidney disease Father     Social History:  reports that she has been smoking Cigarettes.  She has a 4 pack-year smoking history. She uses smokeless tobacco. She reports that she drinks about 4.2 oz of alcohol per week. She reports that she uses illicit drugs (Marijuana and  Cocaine).   Allergies:  Allergies  Allergen Reactions  . Ativan [Lorazepam] Other (See Comments)    Dysarthria(patient has difficulty speaking and slurred speech); denies swelling, itching, pain, or numbness.  [name] [Ziprasidone Hcl] Itching and Swelling    Tongue swelling  . Keflex [Cephalexin] Swelling and Other (See Comments)    Tongue swelling. Can't talk   . Haldol [Haloperidol Lactate] Swelling    Tongue swelling. 05/31/15 - MD ok with giving as pt has tolerated in the past Pt can take benadryl.  . Other Itching    wool     (Not in a hospital admission)     [id number] from the symptoms mentioned above,there are no other symptoms referable to all systems reviewed.      Physical Exam: Vital signs in last 24 hours: Temp:  [98.3 F (36.8 C)] 98.3 F (36.8 C) (03/29 0936) Pulse Rate:  [107] 107 (03/29 0936) Resp:  [20] 20 (03/29 0936) BP: (110)/(77) 110/77 mmHg (03/29 0936) SpO2:  [100 %] 100 % (03/29 0936) Weight:  [202 lb 7 oz (91.825 kg)] 202 lb 7 oz (91.825 kg) (03/29 0936) Weight change:     Intake/Output from previous day:       Physical Exam: General- pt is awake,alert, follows coomands Resp- No  acute REsp distress,  decreased bs at bases. CVS- S1S2 regular in rate and rhythm, NO rubs  GIT- BS+, soft, NT, ND EXT- 2+ LE Edema, NO Cyanosis CNS- CN 2-12 grossly intact. Moving all 4 extremities Access- AVF+, aneurysm present   Lab Results:  CBC    Component Value Date/Time   WBC 10.2 07/02/2015 1013   RBC 3.08* 07/02/2015 1013   RBC 2.77* 06/21/2015 1022   HGB 8.9* 07/02/2015 1013   HCT 28.5* 07/02/2015 1013   PLT 190 07/02/2015 1013   MCV 92.5 07/02/2015 1013   MCH 28.9 07/02/2015 1013   MCHC 31.2 07/02/2015 1013   RDW 21.0* 07/02/2015 1013   LYMPHSABS 1.2 06/04/2015 1448   MONOABS 0.5 06/04/2015 1448   EOSABS 0.2 06/04/2015 1448   BASOSABS 0.0 06/04/2015 1448      BMET  Recent Labs  07/02/15 1013 07/04/15 1311  NA 138 140   K 4.2 4.7  CL 102 101  CO2 24 26  GLUCOSE 121* 98  BUN 61* 66*  CREATININE 6.40* 7.22*  CALCIUM 8.6* 9.0    MICRO Recent Results (from the past 240 hour(s))  MRSA PCR Screening     Status: None   Collection Time: 07/02/15 12:00 PM  Result Value Ref Range Status   MRSA by PCR NEGATIVE NEGATIVE Final    Comment:        The GeneXpert MRSA Assay (FDA approved for NASAL specimens only), is one component of a comprehensive MRSA colonization surveillance program. It is not intended to diagnose MRSA infection nor to guide or monitor treatment for MRSA infections.       Lab Results  Component Value Date   PTH 420* 05/26/2015   CALCIUM 9.0 07/04/2015   CAION 1.15 06/29/2015   PHOS 6.7* 07/02/2015      Impression: 1)Renal  ESRD on HD                Pt is not in regular  Schedule sec to her complaince/adherence issues                Pt last HD was on Monday                Will dialyze pt today.   2)HTN Bp  at goal  3)Anemia In ESRD the goal for HGb is 9--11. Pt HGb is not at goal Will keep on epo   4)CKD Mineral-Bone Disorder PTH acceptable. Secondary Hyperparathyroidism  Present. Phosphorus not at goal.  on binders  5)Psych .  Hx of   psycosis Schizophrenia Primary MD following  6)Electrolytes  Normokalemic NOrmonatremic   7)Acid base Co2  at goal     Plan:  Will dialyze today Will keep on epo Will use 2 k bath Will try to take 3 liters off Pt in with fluid overload, will need Hd again in  Am if still inpt     Sara [name] S 07/04/2015, 2:36 PM
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
## 2015-07-04 NOTE — ED Notes (Signed)
Pt called for triage twice, with no response. Pt not in waiting room, restroom, or outside in parking lot.

## 2015-07-05 NOTE — Discharge Summary (Signed)
Physician Discharge Summary  Sara [name] DOB: [birth year] DOA: 07/04/2015  PCP: No PCP Per Patient  Admit date: 07/04/2015 Discharge date: 07/05/2015  Time spent: 15 minutes  Recommendations for Outpatient Follow-up:  1. Patient left AMA   Discharge Diagnoses:  1. Patient left AMA 2. Hypovolemia secondary to end-stage renal disease. 3. 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 4. Radiographic evidence of pericardial effusion. 5. Essential hypertension. 6. Chronic systolic congestive heart failure. 7. 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 8. 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 9. Schizoaffective disorder, bipolar type.   Discharge Condition: Patient left AMA  Diet recommendation: Patient left [facility]  Filed Weights   07/04/15 0936 07/04/15 1600 07/04/15 2030  Weight: 91.825 kg (202 lb 7 oz) 91.8 kg (202 lb 6.1 oz) 88 kg (194 lb 0.1 oz)    History of present illness:  HPI: Sara [name] is a 35 y.o. female with a history of lupus nephritis, end-stage renal disease on hemodialysis, schizoaffective bipolar disorder, chronic systolic heart failure with an EF of 40-45% per echo 08/2014, and hypertension. Due to her past behavior, she no longer receives outpatient hemodialysis at any of the local hemodialysis centers. Generally, she presents to the ED at [facility] or [facility] hospital for hemodialysis and then typically is discharged or she leaves AMA shortly after HD. She has been admitted to [facility] several times over the past few weeks for hemodialysis. She presented again for a chief complaint of whole body swelling, but particularly in her legs, and some shortness of breath. She denied chest pain, fever, chills, or diarrhea. She did have one episode of nausea and vomiting at home and some upper abdominal pain which have subsided.  In the ED, she was hemodynamically stable with exception of mild tachycardia. She was afebrile. Her chest x-ray  revealed moderate cardiac enlargement, query underlying pericardial effusion, but no edema or consolidation. Her lab data were significant for a BUN of 66 and creatinine of 7.22. Influenza panel was negative. She is being admitted for further evaluation and management.  Hospital Course:  Nephrology was consulted for hemodialysis. Dr. Theador Hawthorne evaluated the patient and ordered hemodialysis. Patient was restarted on her chronic medications. Protonix was added for reported emesis at home. Zofran was ordered as needed for nausea/vomiting. She was continued on prednisone, metoprolol, Claritin, and Flonase, her chronic medications. Xopenex was ordered for mild bronchospasms. She was encouraged to stop smoking, drinking alcohol, and using illicit drugs. 2-D echocardiogram was ordered to evaluate for pericardial effusion.  Apparently following hemodialysis, after the dictating physician left the hospital, the patient left AMA.    Procedures:  Hemodialysis  Consultations:  Nephrology  Discharge Exam: Filed Vitals:   07/04/15 2000 07/04/15 2030  BP: 142/86 147/82  Pulse: 117 118  Temp:  98 F (36.7 C)  Resp:  16  Patient left AMA   Discharge Instructions    Discharge Medication List as of 07/04/2015  9:08 PM    CONTINUE these medications which have NOT CHANGED   Details  acetaminophen (TYLENOL) 500 MG tablet Take 1,000 mg by mouth every 6 (six) hours as needed for moderate pain. Reported on 06/12/2015, Until Discontinued, Historical Med    fluticasone (FLONASE) 50 MCG/ACT nasal spray Place 2 sprays into both nostrils daily. Use for 5 days then stop., Starting 07/02/2015, Until Discontinued, Print    furosemide (LASIX) 40 MG tablet Take 3 tablets (120 mg total) by mouth daily., Starting 07/02/2015, Until Discontinued, Print    guaiFENesin [facility])  600 MG 12 hr tablet Take 1 tablet (600 mg total) by mouth 2 (two) times daily. Take for 4 days then stop., Starting 07/02/2015, Until  Discontinued, Print    loratadine (CLARITIN) 10 MG tablet Take 1 tablet (10 mg total) by mouth daily. Take daily., Starting 07/02/2015, Until Discontinued, Print    metoprolol (LOPRESSOR) 50 MG tablet Take 1 tablet (50 mg total) by mouth 2 (two) times daily., Starting 07/02/2015, Until Discontinued, Print    naproxen sodium (ANAPROX) 220 MG tablet Take 220 mg by mouth daily as needed (pain)., Until Discontinued, Historical Med    predniSONE (DELTASONE) 20 MG tablet Take 1 tablet (20 mg total) by mouth daily with breakfast., Starting 07/02/2015, Until Discontinued, No Print    traMADol (ULTRAM) 50 MG tablet Take 50 mg by mouth every 12 (twelve) hours as needed for moderate pain or severe pain. , Starting 06/05/2015, Until Discontinued, Historical Med       Allergies  Allergen Reactions  . Ativan [Lorazepam] Other (See Comments)    Dysarthria(patient has difficulty speaking and slurred speech); denies swelling, itching, pain, or numbness.  [name] [Ziprasidone Hcl] Itching and Swelling    Tongue swelling  . Keflex [Cephalexin] Swelling and Other (See Comments)    Tongue swelling. Can't talk   . Haldol [Haloperidol Lactate] Swelling    Tongue swelling. 05/31/15 - MD ok with giving as pt has tolerated in the past Pt can take benadryl.  . Other Itching    wool      The results of significant diagnostics from this hospitalization (including imaging, microbiology, ancillary and laboratory) are listed below for reference.    Significant Diagnostic Studies: Dg Chest 2 View  07/02/2015  CLINICAL DATA:  Shortness of breath for 5 days. Cough. History of renal failure EXAM: CHEST  2 VIEW COMPARISON:  June 14, 2015 FINDINGS: There is no edema or consolidation. There is moderate cardiac enlargement. The pulmonary vascular is normal. No adenopathy. No bone lesions. IMPRESSION: Moderate cardiac enlargement. Question underlying pericardial effusion. No edema or consolidation. Electronically Signed   By:  Lowella Grip III M.D.   On: 07/02/2015 11:28   Dg Chest 2 View  06/12/2015  CLINICAL DATA:  Shortness of breath for 5 days. EXAM: CHEST  2 VIEW COMPARISON:  Radiographs 06/08/2015, multiple priors. FINDINGS: Cardiomegaly is stable. Vascular congestion again seen. Small pleural effusions, with minimal fluid in the minor fissure. No confluent airspace disease or pneumothorax. Resorption of both distal clavicle again seen. IMPRESSION: Chronic cardiomegaly, vascular congestion, and small pleural effusions. Electronically Signed   By: Jeb Levering M.D.   On: 06/12/2015 00:07   Dg Chest Portable 1 View  06/14/2015  CLINICAL DATA:  Increased lower extremity edema in shortness of breath, dialysis patient EXAM: PORTABLE CHEST 1 VIEW COMPARISON:  06/11/2015 FINDINGS: Severe cardiac enlargement stable. Central vascular congestion. Small right pleural effusion intercalating into the minor fissure. Probable tiny left effusion. No definite pulmonary edema. IMPRESSION: Chronic cardiac enlargement and vascular congestion with small pleural effusions Electronically Signed   By: Skipper Cliche M.D.   On: 06/14/2015 17:39   Dg Chest Portable 1 View  06/08/2015  CLINICAL DATA:  Acute onset of shortness of breath. Initial encounter. EXAM: PORTABLE CHEST 1 VIEW COMPARISON:  Chest radiograph performed 06/04/2015 FINDINGS: The lungs are well-aerated. Vascular congestion is noted. Mildly increased interstitial markings may reflect mild interstitial edema or possibly pneumonia. There is no evidence of pleural effusion or pneumothorax. The cardiomediastinal silhouette is enlarged. No acute  osseous abnormalities are seen. IMPRESSION: Vascular congestion and cardiomegaly. Mildly increased interstitial markings may reflect mild interstitial edema or possibly pneumonia. Electronically Signed   By: Garald Balding M.D.   On: 06/08/2015 05:11    Microbiology: Recent Results (from the past 240 hour(s))  MRSA PCR Screening      Status: None   Collection Time: 07/02/15 12:00 PM  Result Value Ref Range Status   MRSA by PCR NEGATIVE NEGATIVE Final    Comment:        The GeneXpert MRSA Assay (FDA approved for NASAL specimens only), is one component of a comprehensive MRSA colonization surveillance program. It is not intended to diagnose MRSA infection nor to guide or monitor treatment for MRSA infections.      Labs: Basic Metabolic Panel:  Recent Labs Lab 06/29/15 0921 06/29/15 1532 07/02/15 1013 07/04/15 1311  NA 138 134* 138 140  K 4.6 5.3* 4.2 4.7  CL 99* 100* 102 101  CO2  --  23 24 26   GLUCOSE 78 99 121* 98  BUN 69* 82* 61* 66*  CREATININE 8.60* 8.04* 6.40* 7.22*  CALCIUM  --  8.4* 8.6* 9.0  PHOS  --  6.6* 6.7*  --    Liver Function Tests:  Recent Labs Lab 06/29/15 1532 07/02/15 1013  ALBUMIN 3.1* 3.1*   No results for input(s): LIPASE, AMYLASE in the last 168 hours. No results for input(s): AMMONIA in the last 168 hours. CBC:  Recent Labs Lab 06/29/15 0921 06/29/15 1532 07/02/15 1013  WBC  --  9.9 10.2  HGB 9.9* 8.1* 8.9*  HCT 29.0* 25.8* 28.5*  MCV  --  89.9 92.5  PLT  --  191 190   Cardiac Enzymes: No results for input(s): CKTOTAL, CKMB, CKMBINDEX, TROPONINI in the last 168 hours. BNP: BNP (last 3 results)  Recent Labs  09/11/14 0815  BNP 1694.9*    ProBNP (last 3 results) No results for input(s): PROBNP in the last 8760 hours.  CBG: No results for input(s): GLUCAP in the last 168 hours.     Signed:  Ziyan Hillmer MD.  [facility] 07/05/2015, 7:16 PM

## 2015-07-06 ENCOUNTER — Encounter (HOSPITAL_COMMUNITY): Payer: Self-pay | Admitting: Emergency Medicine

## 2015-07-06 ENCOUNTER — Emergency Department (HOSPITAL_COMMUNITY)
Admission: EM | Admit: 2015-07-06 | Discharge: 2015-07-06 | Disposition: A | Discharge status: Home / Self Care | Payer: Medicare Other | Attending: Dermatology | Admitting: Dermatology

## 2015-07-06 DIAGNOSIS — Z5321 Procedure and treatment not carried out due to patient leaving prior to being seen by health care provider: Secondary | ICD-10-CM | POA: Diagnosis not present

## 2015-07-06 DIAGNOSIS — F319 Bipolar disorder, unspecified: Secondary | ICD-10-CM | POA: Diagnosis not present

## 2015-07-06 DIAGNOSIS — F17210 Nicotine dependence, cigarettes, uncomplicated: Secondary | ICD-10-CM | POA: Insufficient documentation

## 2015-07-06 DIAGNOSIS — I132 Hypertensive heart and chronic kidney disease with heart failure and with stage 5 chronic kidney disease, or end stage renal disease: Secondary | ICD-10-CM | POA: Diagnosis not present

## 2015-07-06 DIAGNOSIS — Z992 Dependence on renal dialysis: Secondary | ICD-10-CM | POA: Insufficient documentation

## 2015-07-06 DIAGNOSIS — N186 End stage renal disease: Secondary | ICD-10-CM | POA: Insufficient documentation

## 2015-07-06 DIAGNOSIS — Z4931 Encounter for adequacy testing for hemodialysis: Secondary | ICD-10-CM | POA: Insufficient documentation

## 2015-07-06 DIAGNOSIS — I509 Heart failure, unspecified: Secondary | ICD-10-CM | POA: Insufficient documentation

## 2015-07-06 NOTE — ED Notes (Signed)
Lab tech stated she attempted to stick pt twice and the pt told her to stop.

## 2015-07-06 NOTE — ED Notes (Signed)
Lab tech stated when she went to drawn pt's lab work she was not in the waiting room. She had gone to the cafeteria to get food

## 2015-07-06 NOTE — ED Notes (Signed)
PT left hospital at this time and told registration she was going home.

## 2015-07-06 NOTE — ED Notes (Signed)
PT stated she had dialysis on Wednesday 07/04/15 and requesting dialysis to be completed today. PT denies any complaints on arrival.

## 2015-07-08 ENCOUNTER — Emergency Department (HOSPITAL_COMMUNITY): Payer: Medicare Other

## 2015-07-08 ENCOUNTER — Emergency Department (HOSPITAL_COMMUNITY)
Admission: EM | Admit: 2015-07-08 | Discharge: 2015-07-08 | Disposition: A | Discharge status: Home / Self Care | Payer: Medicare Other | Attending: Emergency Medicine | Admitting: Emergency Medicine

## 2015-07-08 ENCOUNTER — Encounter (HOSPITAL_COMMUNITY): Payer: Self-pay | Admitting: *Deleted

## 2015-07-08 DIAGNOSIS — F17210 Nicotine dependence, cigarettes, uncomplicated: Secondary | ICD-10-CM | POA: Diagnosis not present

## 2015-07-08 DIAGNOSIS — Z992 Dependence on renal dialysis: Secondary | ICD-10-CM

## 2015-07-08 DIAGNOSIS — Z452 Encounter for adjustment and management of vascular access device: Secondary | ICD-10-CM | POA: Diagnosis present

## 2015-07-08 DIAGNOSIS — R05 Cough: Secondary | ICD-10-CM

## 2015-07-08 DIAGNOSIS — Z8739 Personal history of other diseases of the musculoskeletal system and connective tissue: Secondary | ICD-10-CM | POA: Insufficient documentation

## 2015-07-08 DIAGNOSIS — Z9119 Patient's noncompliance with other medical treatment and regimen: Secondary | ICD-10-CM | POA: Diagnosis not present

## 2015-07-08 DIAGNOSIS — R0602 Shortness of breath: Secondary | ICD-10-CM | POA: Diagnosis not present

## 2015-07-08 DIAGNOSIS — N186 End stage renal disease: Secondary | ICD-10-CM | POA: Diagnosis not present

## 2015-07-08 DIAGNOSIS — Z7952 Long term (current) use of systemic steroids: Secondary | ICD-10-CM | POA: Insufficient documentation

## 2015-07-08 DIAGNOSIS — R600 Localized edema: Secondary | ICD-10-CM | POA: Diagnosis not present

## 2015-07-08 DIAGNOSIS — R059 Cough, unspecified: Secondary | ICD-10-CM

## 2015-07-08 DIAGNOSIS — I120 Hypertensive chronic kidney disease with stage 5 chronic kidney disease or end stage renal disease: Secondary | ICD-10-CM | POA: Insufficient documentation

## 2015-07-08 DIAGNOSIS — F319 Bipolar disorder, unspecified: Secondary | ICD-10-CM | POA: Diagnosis not present

## 2015-07-08 DIAGNOSIS — Z862 Personal history of diseases of the blood and blood-forming organs and certain disorders involving the immune mechanism: Secondary | ICD-10-CM | POA: Insufficient documentation

## 2015-07-08 DIAGNOSIS — I509 Heart failure, unspecified: Secondary | ICD-10-CM | POA: Diagnosis not present

## 2015-07-08 DIAGNOSIS — Z4931 Encounter for adequacy testing for hemodialysis: Secondary | ICD-10-CM | POA: Diagnosis not present

## 2015-07-08 DIAGNOSIS — Z79899 Other long term (current) drug therapy: Secondary | ICD-10-CM | POA: Insufficient documentation

## 2015-07-08 LAB — COMPREHENSIVE METABOLIC PANEL
ALBUMIN: 3.3 g/dL — AB (ref 3.5–5.0)
ALK PHOS: 90 U/L (ref 38–126)
ALT: 38 U/L (ref 14–54)
ANION GAP: 18 — AB (ref 5–15)
AST: 27 U/L (ref 15–41)
BUN: 101 mg/dL — ABNORMAL HIGH (ref 6–20)
CALCIUM: 9.9 mg/dL (ref 8.9–10.3)
CO2: 21 mmol/L — ABNORMAL LOW (ref 22–32)
Chloride: 100 mmol/L — ABNORMAL LOW (ref 101–111)
Creatinine, Ser: 10.26 mg/dL — ABNORMAL HIGH (ref 0.44–1.00)
GFR calc Af Amer: 5 mL/min — ABNORMAL LOW (ref >60)
GFR calc non Af Amer: 4 mL/min — ABNORMAL LOW (ref >60)
GLUCOSE: 82 mg/dL (ref 65–99)
POTASSIUM: 5.4 mmol/L — AB (ref 3.5–5.1)
SODIUM: 139 mmol/L (ref 135–145)
TOTAL PROTEIN: 6.9 g/dL (ref 6.5–8.1)
Total Bilirubin: 1 mg/dL (ref 0.3–1.2)

## 2015-07-08 LAB — CBC WITH DIFFERENTIAL/PLATELET
BASOS ABS: 0 10*3/uL (ref 0.0–0.1)
BASOS PCT: 0 %
Eosinophils Absolute: 0.1 10*3/uL (ref 0.0–0.7)
Eosinophils Relative: 1 %
HEMATOCRIT: 28 % — AB (ref 36.0–46.0)
HEMOGLOBIN: 8.7 g/dL — AB (ref 12.0–15.0)
LYMPHS PCT: 19 %
Lymphs Abs: 1.3 10*3/uL (ref 0.7–4.0)
MCH: 27.5 pg (ref 26.0–34.0)
MCHC: 31.1 g/dL (ref 30.0–36.0)
MCV: 88.6 fL (ref 78.0–100.0)
MONO ABS: 0.6 10*3/uL (ref 0.1–1.0)
Monocytes Relative: 9 %
NEUTROS ABS: 4.7 10*3/uL (ref 1.7–7.7)
NEUTROS PCT: 71 %
Platelets: 127 10*3/uL — ABNORMAL LOW (ref 150–400)
RBC: 3.16 MIL/uL — ABNORMAL LOW (ref 3.87–5.11)
RDW: 19.1 % — ABNORMAL HIGH (ref 11.5–15.5)
WBC: 6.7 10*3/uL (ref 4.0–10.5)

## 2015-07-08 MED ORDER — ACETAMINOPHEN 325 MG PO TABS
650.0000 mg | ORAL_TABLET | Freq: Once | ORAL | Status: AC
  Administered 2015-07-08: 650 mg via ORAL
  Filled 2015-07-08: qty 2

## 2015-07-08 NOTE — ED Notes (Signed)
Pt reports needing dialysis, last treatment was wed. Also reports having a cough, sore throat and left side ear pain. No acute distress noted at triage.

## 2015-07-08 NOTE — ED Notes (Signed)
Pt requested this nurse to call dialysis to find out what time to be here in the am.  Per dialysis no time can be given, all inpatients will be done first.  Advised pt the same, pt reports she will go to [facility] in the am.

## 2015-07-08 NOTE — ED Notes (Signed)
Ordered lunch tray. [place] with PA.

## 2015-07-08 NOTE — Discharge Instructions (Signed)
Please return to the emergency room tomorrow morning around 6:30-7 AM for your dialysis session.
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
## 2015-07-08 NOTE — ED Notes (Signed)
Called for room x2 no answer. 

## 2015-07-09 ENCOUNTER — Encounter (HOSPITAL_COMMUNITY): Payer: Self-pay | Admitting: Cardiology

## 2015-07-09 ENCOUNTER — Observation Stay (HOSPITAL_COMMUNITY)
Admission: EM | Admit: 2015-07-09 | Discharge: 2015-07-09 | Disposition: A | Discharge status: Home / Self Care | Payer: Medicare Other | Attending: Internal Medicine | Admitting: Internal Medicine

## 2015-07-09 DIAGNOSIS — N186 End stage renal disease: Secondary | ICD-10-CM | POA: Diagnosis not present

## 2015-07-09 DIAGNOSIS — I10 Essential (primary) hypertension: Secondary | ICD-10-CM | POA: Diagnosis present

## 2015-07-09 DIAGNOSIS — I509 Heart failure, unspecified: Secondary | ICD-10-CM | POA: Diagnosis not present

## 2015-07-09 DIAGNOSIS — I5022 Chronic systolic (congestive) heart failure: Secondary | ICD-10-CM | POA: Diagnosis not present

## 2015-07-09 DIAGNOSIS — R197 Diarrhea, unspecified: Secondary | ICD-10-CM | POA: Insufficient documentation

## 2015-07-09 DIAGNOSIS — Z992 Dependence on renal dialysis: Secondary | ICD-10-CM | POA: Diagnosis not present

## 2015-07-09 DIAGNOSIS — M7989 Other specified soft tissue disorders: Secondary | ICD-10-CM | POA: Insufficient documentation

## 2015-07-09 DIAGNOSIS — F17210 Nicotine dependence, cigarettes, uncomplicated: Secondary | ICD-10-CM | POA: Diagnosis not present

## 2015-07-09 DIAGNOSIS — Z79899 Other long term (current) drug therapy: Secondary | ICD-10-CM | POA: Diagnosis not present

## 2015-07-09 DIAGNOSIS — Z4931 Encounter for adequacy testing for hemodialysis: Secondary | ICD-10-CM | POA: Diagnosis present

## 2015-07-09 DIAGNOSIS — D631 Anemia in chronic kidney disease: Secondary | ICD-10-CM | POA: Diagnosis not present

## 2015-07-09 DIAGNOSIS — I5023 Acute on chronic systolic (congestive) heart failure: Secondary | ICD-10-CM | POA: Diagnosis present

## 2015-07-09 DIAGNOSIS — I132 Hypertensive heart and chronic kidney disease with heart failure and with stage 5 chronic kidney disease, or end stage renal disease: Principal | ICD-10-CM | POA: Insufficient documentation

## 2015-07-09 DIAGNOSIS — F250 Schizoaffective disorder, bipolar type: Secondary | ICD-10-CM | POA: Insufficient documentation

## 2015-07-09 DIAGNOSIS — I120 Hypertensive chronic kidney disease with stage 5 chronic kidney disease or end stage renal disease: Secondary | ICD-10-CM | POA: Diagnosis not present

## 2015-07-09 MED ORDER — LIDOCAINE-PRILOCAINE 2.5-2.5 % EX CREA
1.0000 | TOPICAL_CREAM | CUTANEOUS | Status: DC | PRN
Start: 2015-07-09 — End: 2015-07-09

## 2015-07-09 MED ORDER — PENTAFLUOROPROP-TETRAFLUOROETH EX AERO
1.0000 | INHALATION_SPRAY | CUTANEOUS | Status: DC | PRN
Start: 2015-07-09 — End: 2015-07-09

## 2015-07-09 MED ORDER — GUAIFENESIN ER 600 MG PO TB12
600.0000 mg | ORAL_TABLET | Freq: Two times a day (BID) | ORAL | Status: DC
  Filled 2015-07-09 (×5): qty 1

## 2015-07-09 MED ORDER — PREDNISONE 20 MG PO TABS: 20.0000 mg | ORAL_TABLET | Freq: Every day | ORAL | Status: DC

## 2015-07-09 MED ORDER — SODIUM CHLORIDE 0.9 % IV SOLN: 100.0000 mL | INTRAVENOUS | Status: DC | PRN

## 2015-07-09 MED ORDER — DIPHENHYDRAMINE HCL 25 MG PO CAPS
25.0000 mg | ORAL_CAPSULE | Freq: Four times a day (QID) | ORAL | Status: DC | PRN
  Administered 2015-07-09: 25 mg via ORAL

## 2015-07-09 MED ORDER — FUROSEMIDE 80 MG PO TABS: 120.0000 mg | ORAL_TABLET | Freq: Every day | ORAL | Status: DC

## 2015-07-09 MED ORDER — DIPHENHYDRAMINE HCL 25 MG PO CAPS
ORAL_CAPSULE | ORAL | Status: AC
  Administered 2015-07-09: 25 mg via ORAL
  Filled 2015-07-09: qty 1

## 2015-07-09 MED ORDER — SODIUM CHLORIDE 0.9% FLUSH: 3.0000 mL | Freq: Two times a day (BID) | INTRAVENOUS | Status: DC

## 2015-07-09 MED ORDER — METOPROLOL TARTRATE 50 MG PO TABS: 50.0000 mg | ORAL_TABLET | Freq: Two times a day (BID) | ORAL | Status: DC

## 2015-07-09 MED ORDER — TRAMADOL HCL 50 MG PO TABS
50.0000 mg | ORAL_TABLET | Freq: Two times a day (BID) | ORAL | Status: DC | PRN
  Administered 2015-07-09: 50 mg via ORAL

## 2015-07-09 MED ORDER — HEPARIN SODIUM (PORCINE) 5000 UNIT/ML IJ SOLN: 5000.0000 [IU] | Freq: Three times a day (TID) | INTRAMUSCULAR | Status: DC

## 2015-07-09 MED ORDER — LIDOCAINE HCL (PF) 1 % IJ SOLN
INTRAMUSCULAR | Status: AC
  Administered 2015-07-09: 0.5 mL via INTRADERMAL
  Filled 2015-07-09: qty 5

## 2015-07-09 MED ORDER — TRAMADOL HCL 50 MG PO TABS
ORAL_TABLET | ORAL | Status: AC
  Administered 2015-07-09: 50 mg via ORAL
  Filled 2015-07-09: qty 1

## 2015-07-09 MED ORDER — LIDOCAINE HCL (PF) 1 % IJ SOLN
5.0000 mL | INTRAMUSCULAR | Status: DC | PRN
  Administered 2015-07-09: 0.5 mL via INTRADERMAL

## 2015-07-09 NOTE — Discharge Summary (Signed)
Physician Discharge Summary  Sara [name] [id number] DOB: 1980/05/21 DOA: 07/09/2015  PCP: No PCP Per Patient  Admit date: 07/09/2015 Discharge date: 07/09/2015  PATIENT LEFT AGAINST MEDICAL ADVISE  Discharge Diagnoses:  Principal Problem:   ESRD (end stage renal disease) ([place]) Active Problems:   Essential hypertension   Acute on chronic systolic congestive heart failure (HCC)   Chronic systolic CHF (congestive heart failure) ([place])   Filed Weights   07/09/15 1035 07/09/15 1150 07/09/15 1645  Weight: 97.523 kg (215 lb) 97.5 kg (214 lb 15.2 oz) 91.5 kg (201 lb 11.5 oz)    History of present illness:  Please review dictated H and P from today for details. Briefly, 35 y.o. female well known to the system with hx of ESRD, gross noncompliance, HTN presents with sob and vol overload after missing last HD session. Pt was recently d/c'd from [facility] for needing HD. Nephrology consulted. Hospitalist consulted for admission. Patient has numerous hospital re-admissions and leaving against medical advise.  Hospital Course:  1. ESRD 1. On [name] HD 2. Noncompliant with treatment and missed Friday's HD 3. Nephrology consulted. Pt underwent HD today 4. Shortly after HD, pt returned to room, ate dinner, showered, then left hospital against medical advise 2. HTN 1. BP remained stable 3. DVT prophylaxis 1. Heparin subQ while admitted  Procedures:  Dialysis  Consultations:  Nephrology  Discharge Exam: Filed Vitals:   07/09/15 1530 07/09/15 1600 07/09/15 1630 07/09/15 1645  BP: 167/90 158/95 166/99 170/101  Pulse: 111 107 110 105  Temp:      TempSrc:      Resp:    16  Height:      Weight:    91.5 kg (201 lb 11.5 oz)  SpO2:        Discharge Instructions     Medication List    ASK your doctor about these medications        acetaminophen 500 MG tablet  Commonly known as:  TYLENOL  Take 1,000 mg by mouth every 6 (six) hours as needed for moderate pain. Reported on 06/12/2015      fluticasone 50 MCG/ACT nasal spray  Commonly known as:  FLONASE  Place 2 sprays into both nostrils daily. Use for 5 days then stop.     furosemide 40 MG tablet  Commonly known as:  LASIX  Take 3 tablets (120 mg total) by mouth daily.     guaiFENesin 600 MG 12 hr tablet  Commonly known as:  MUCINEX  Take 1 tablet (600 mg total) by mouth 2 (two) times daily. Take for 4 days then stop.     loratadine 10 MG tablet  Commonly known as:  CLARITIN  Take 1 tablet (10 mg total) by mouth daily. Take daily.     metoprolol 50 MG tablet  Commonly known as:  LOPRESSOR  Take 1 tablet (50 mg total) by mouth 2 (two) times daily.     naproxen sodium 220 MG tablet  Commonly known as:  ANAPROX  Take 220 mg by mouth daily as needed (pain).     predniSONE 20 MG tablet  Commonly known as:  DELTASONE  Take 1 tablet (20 mg total) by mouth daily with breakfast.       Allergies  Allergen Reactions  . Ativan [Lorazepam] Other (See Comments)    Dysarthria(patient has difficulty speaking and slurred speech); denies swelling, itching, pain, or numbness.  [name] [Ziprasidone Hcl] Itching and Swelling    Tongue swelling  . Keflex [Cephalexin]  Swelling and Other (See Comments)    Tongue swelling. Can't talk   . Haldol [Haloperidol Lactate] Swelling    Tongue swelling. 05/31/15 - MD ok with giving as pt has tolerated in the past Pt can take benadryl.  . Other Itching    wool      The results of significant diagnostics from this hospitalization (including imaging, microbiology, ancillary and laboratory) are listed below for reference.    Significant Diagnostic Studies: Dg Chest 2 View  07/08/2015  CLINICAL DATA:  Left-sided chest pain, shortness of breath, productive cough. EXAM: CHEST  2 VIEW COMPARISON:  July 02, 2015.  February 07, 2015. FINDINGS: Moderate cardiomegaly is noted. No pneumothorax or pleural effusion is noted. Stable mild central pulmonary vascular congestion is noted. Stable  chronic resorption of both distal clavicles is noted. IMPRESSION: Stable cardiomegaly and mild central pulmonary vascular congestion. Electronically Signed   By: Marijo Conception, M.D.   On: 07/08/2015 13:32   Dg Chest 2 View  07/02/2015  CLINICAL DATA:  Shortness of breath for 5 days. Cough. History of renal failure EXAM: CHEST  2 VIEW COMPARISON:  June 14, 2015 FINDINGS: There is no edema or consolidation. There is moderate cardiac enlargement. The pulmonary vascular is normal. No adenopathy. No bone lesions. IMPRESSION: Moderate cardiac enlargement. Question underlying pericardial effusion. No edema or consolidation. Electronically Signed   By: Lowella Grip III M.D.   On: 07/02/2015 11:28   Dg Chest 2 View  06/12/2015  CLINICAL DATA:  Shortness of breath for 5 days. EXAM: CHEST  2 VIEW COMPARISON:  Radiographs 06/08/2015, multiple priors. FINDINGS: Cardiomegaly is stable. Vascular congestion again seen. Small pleural effusions, with minimal fluid in the minor fissure. No confluent airspace disease or pneumothorax. Resorption of both distal clavicle again seen. IMPRESSION: Chronic cardiomegaly, vascular congestion, and small pleural effusions. Electronically Signed   By: Jeb Levering M.D.   On: 06/12/2015 00:07   Dg Chest Portable 1 View  06/14/2015  CLINICAL DATA:  Increased lower extremity edema in shortness of breath, dialysis patient EXAM: PORTABLE CHEST 1 VIEW COMPARISON:  06/11/2015 FINDINGS: Severe cardiac enlargement stable. Central vascular congestion. Small right pleural effusion intercalating into the minor fissure. Probable tiny left effusion. No definite pulmonary edema. IMPRESSION: Chronic cardiac enlargement and vascular congestion with small pleural effusions Electronically Signed   By: Skipper Cliche M.D.   On: 06/14/2015 17:39    Microbiology: Recent Results (from the past 240 hour(s))  MRSA PCR Screening     Status: None   Collection Time: 07/02/15 12:00 PM  Result Value  Ref Range Status   MRSA by PCR NEGATIVE NEGATIVE Final    Comment:        The GeneXpert MRSA Assay (FDA approved for NASAL specimens only), is one component of a comprehensive MRSA colonization surveillance program. It is not intended to diagnose MRSA infection nor to guide or monitor treatment for MRSA infections.      Labs: Basic Metabolic Panel:  Recent Labs Lab 07/04/15 1311 07/08/15 1502  NA 140 139  K 4.7 5.4*  CL 101 100*  CO2 26 21*  GLUCOSE 98 82  BUN 66* 101*  CREATININE 7.22* 10.26*  CALCIUM 9.0 9.9   Liver Function Tests:  Recent Labs Lab 07/08/15 1502  AST 27  ALT 38  ALKPHOS 90  BILITOT 1.0  PROT 6.9  ALBUMIN 3.3*   No results for input(s): LIPASE, AMYLASE in the last 168 hours. No results for input(s): AMMONIA in the  last 168 hours. CBC:  Recent Labs Lab 07/08/15 1502  WBC 6.7  NEUTROABS 4.7  HGB 8.7*  HCT 28.0*  MCV 88.6  PLT 127*   Cardiac Enzymes: No results for input(s): CKTOTAL, CKMB, CKMBINDEX, TROPONINI in the last 168 hours. BNP: BNP (last 3 results)  Recent Labs  09/11/14 0815  BNP 1694.9*    ProBNP (last 3 results) No results for input(s): PROBNP in the last 8760 hours.  CBG: No results for input(s): GLUCAP in the last 168 hours.   Signed:  CHIU, Orpah Melter  [facility] 07/09/2015, 5:38 PM

## 2015-07-09 NOTE — Progress Notes (Signed)
Pt returned from dialysis, took a shower, ate dinner and requested to sign out of the hospital against medical advice. MD notified.Pt signed [place] paperwork and left the hospital.

## 2015-07-09 NOTE — ED Notes (Signed)
Patient given ice water per MD at this time.

## 2015-07-09 NOTE — ED Notes (Signed)
Patient placed in a wheel chair behind triage.
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
## 2015-07-09 NOTE — ED Notes (Signed)
States she is here for her routine dialysis

## 2015-07-09 NOTE — Procedures (Signed)
   HEMODIALYSIS TREATMENT NOTE:  4.5 hour heparin-free dialysis completed via right upper arm AVF (15g ante/retrograde). Goal met; 4 liters removed without interruption in ultrafiltration. All blood was returned and hemostasis was achieved within 15 minutes. Report called to Horris Latino, Therapist, sports.  Rockwell Alexandria, RN, CDN

## 2015-07-09 NOTE — ED Notes (Signed)
Pt refusing IV 

## 2015-07-09 NOTE — ED Notes (Signed)
Spoke with Levada Dy on call for dialysis--- states she is coming in to do patient's dialysis.
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
## 2015-07-10 ENCOUNTER — Other Ambulatory Visit: Payer: Self-pay

## 2015-07-10 NOTE — Patient Outreach (Addendum)
[facility]) Care Management  07/10/2015  BAILYNN GIESKE [birth year] [id number]   Telephone call to patient for screening call.  Explained to patient [facility].  Patient states she does not need services at this time and hung up.  Patient does not have a primary care doctor listed.   Plan: RN Health Coach will send letter to patient and brochure. RN Health Coach will send closure notification to care management assistant.    Jone Baseman, RN, MSN [place] [phone]

## 2015-07-11 ENCOUNTER — Encounter (HOSPITAL_COMMUNITY): Payer: Self-pay | Admitting: Emergency Medicine

## 2015-07-11 ENCOUNTER — Observation Stay (HOSPITAL_COMMUNITY)
Admission: EM | Admit: 2015-07-11 | Discharge: 2015-07-11 | Disposition: A | Discharge status: Home / Self Care | Payer: Medicare Other | Attending: Internal Medicine | Admitting: Internal Medicine

## 2015-07-11 DIAGNOSIS — F250 Schizoaffective disorder, bipolar type: Secondary | ICD-10-CM | POA: Insufficient documentation

## 2015-07-11 DIAGNOSIS — N186 End stage renal disease: Secondary | ICD-10-CM | POA: Diagnosis present

## 2015-07-11 DIAGNOSIS — I132 Hypertensive heart and chronic kidney disease with heart failure and with stage 5 chronic kidney disease, or end stage renal disease: Principal | ICD-10-CM | POA: Insufficient documentation

## 2015-07-11 DIAGNOSIS — N19 Unspecified kidney failure: Secondary | ICD-10-CM

## 2015-07-11 DIAGNOSIS — Z992 Dependence on renal dialysis: Secondary | ICD-10-CM

## 2015-07-11 DIAGNOSIS — Z79899 Other long term (current) drug therapy: Secondary | ICD-10-CM | POA: Diagnosis not present

## 2015-07-11 DIAGNOSIS — F17210 Nicotine dependence, cigarettes, uncomplicated: Secondary | ICD-10-CM | POA: Insufficient documentation

## 2015-07-11 DIAGNOSIS — I509 Heart failure, unspecified: Secondary | ICD-10-CM | POA: Diagnosis not present

## 2015-07-11 DIAGNOSIS — D631 Anemia in chronic kidney disease: Secondary | ICD-10-CM | POA: Diagnosis not present

## 2015-07-11 DIAGNOSIS — I10 Essential (primary) hypertension: Secondary | ICD-10-CM | POA: Diagnosis not present

## 2015-07-11 DIAGNOSIS — M545 Low back pain: Secondary | ICD-10-CM | POA: Diagnosis present

## 2015-07-11 LAB — CREATININE, SERUM
CREATININE: 10.18 mg/dL — AB (ref 0.44–1.00)
GFR calc non Af Amer: 4 mL/min — ABNORMAL LOW (ref >60)
GFR, EST AFRICAN AMERICAN: 5 mL/min — AB (ref >60)

## 2015-07-11 LAB — CBC
HCT: 25.4 % — ABNORMAL LOW (ref 36.0–46.0)
Hemoglobin: 8.2 g/dL — ABNORMAL LOW (ref 12.0–15.0)
MCH: 28.3 pg (ref 26.0–34.0)
MCHC: 32.3 g/dL (ref 30.0–36.0)
MCV: 87.6 fL (ref 78.0–100.0)
PLATELETS: 129 10*3/uL — AB (ref 150–400)
RBC: 2.9 MIL/uL — AB (ref 3.87–5.11)
RDW: 18.6 % — AB (ref 11.5–15.5)
WBC: 5.5 10*3/uL (ref 4.0–10.5)

## 2015-07-11 MED ORDER — SODIUM CHLORIDE 0.9% FLUSH: 3.0000 mL | Freq: Two times a day (BID) | INTRAVENOUS | Status: DC

## 2015-07-11 MED ORDER — HEPARIN SODIUM (PORCINE) 1000 UNIT/ML DIALYSIS
1000.0000 [IU] | INTRAMUSCULAR | Status: DC | PRN
  Administered 2015-07-11: 1000 [IU] via INTRAVENOUS_CENTRAL

## 2015-07-11 MED ORDER — PENTAFLUOROPROP-TETRAFLUOROETH EX AERO: 1.0000 "application " | INHALATION_SPRAY | CUTANEOUS | Status: DC | PRN

## 2015-07-11 MED ORDER — TRAMADOL HCL 50 MG PO TABS: 50.0000 mg | ORAL_TABLET | Freq: Three times a day (TID) | ORAL | Status: DC | PRN

## 2015-07-11 MED ORDER — SODIUM CHLORIDE 0.9 % IV SOLN: 250.0000 mL | INTRAVENOUS | Status: DC | PRN

## 2015-07-11 MED ORDER — SODIUM CHLORIDE 0.9% FLUSH: 3.0000 mL | INTRAVENOUS | Status: DC | PRN

## 2015-07-11 MED ORDER — FUROSEMIDE 40 MG PO TABS: 120.0000 mg | ORAL_TABLET | Freq: Every day | ORAL | Status: DC

## 2015-07-11 MED ORDER — PREDNISONE 20 MG PO TABS: 20.0000 mg | ORAL_TABLET | Freq: Every day | ORAL | Status: DC

## 2015-07-11 MED ORDER — EPOETIN ALFA 4000 UNIT/ML IJ SOLN
INTRAMUSCULAR | Status: AC
  Administered 2015-07-11: 4000 [IU] via INTRAVENOUS
  Filled 2015-07-11: qty 1

## 2015-07-11 MED ORDER — LIDOCAINE HCL (PF) 1 % IJ SOLN
INTRAMUSCULAR | Status: AC
  Administered 2015-07-11: 0.5 mL via INTRADERMAL
  Filled 2015-07-11: qty 5

## 2015-07-11 MED ORDER — LIDOCAINE-PRILOCAINE 2.5-2.5 % EX CREA: 1.0000 "application " | TOPICAL_CREAM | CUTANEOUS | Status: DC | PRN

## 2015-07-11 MED ORDER — EPOETIN ALFA 4000 UNIT/ML IJ SOLN
4000.0000 [IU] | INTRAMUSCULAR | Status: DC
  Administered 2015-07-11: 4000 [IU] via INTRAVENOUS

## 2015-07-11 MED ORDER — DIPHENHYDRAMINE HCL 25 MG PO CAPS
25.0000 mg | ORAL_CAPSULE | Freq: Four times a day (QID) | ORAL | Status: DC | PRN
  Administered 2015-07-11: 25 mg via ORAL

## 2015-07-11 MED ORDER — HEPARIN SODIUM (PORCINE) 1000 UNIT/ML IJ SOLN
INTRAMUSCULAR | Status: AC
  Administered 2015-07-11: 1000 [IU] via INTRAVENOUS_CENTRAL
  Filled 2015-07-11: qty 4

## 2015-07-11 MED ORDER — ACETAMINOPHEN 500 MG PO TABS: 1000.0000 mg | ORAL_TABLET | Freq: Four times a day (QID) | ORAL | Status: DC | PRN

## 2015-07-11 MED ORDER — EPOETIN ALFA 4000 UNIT/ML IJ SOLN: 4000.0000 [IU] | INTRAMUSCULAR | Status: DC

## 2015-07-11 MED ORDER — LORATADINE 10 MG PO TABS: 10.0000 mg | ORAL_TABLET | Freq: Every day | ORAL | Status: DC

## 2015-07-11 MED ORDER — DIPHENHYDRAMINE HCL 25 MG PO CAPS
ORAL_CAPSULE | ORAL | Status: AC
  Administered 2015-07-11: 25 mg via ORAL
  Filled 2015-07-11: qty 1

## 2015-07-11 MED ORDER — METOPROLOL TARTRATE 50 MG PO TABS: 50.0000 mg | ORAL_TABLET | Freq: Two times a day (BID) | ORAL | Status: DC

## 2015-07-11 MED ORDER — LIDOCAINE HCL (PF) 1 % IJ SOLN
5.0000 mL | INTRAMUSCULAR | Status: DC | PRN
  Administered 2015-07-11: 0.5 mL via INTRADERMAL

## 2015-07-11 MED ORDER — SODIUM CHLORIDE 0.9 % IV SOLN: 100.0000 mL | INTRAVENOUS | Status: DC | PRN

## 2015-07-11 MED ORDER — ENOXAPARIN SODIUM 40 MG/0.4ML ~~LOC~~ SOLN: 40.0000 mg | SUBCUTANEOUS | Status: DC

## 2015-07-11 MED ORDER — GUAIFENESIN ER 600 MG PO TB12: 600.0000 mg | ORAL_TABLET | Freq: Two times a day (BID) | ORAL | Status: DC
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
## 2015-07-11 NOTE — ED Notes (Signed)
Pt here for here dialysis, complain of low back pain

## 2015-07-11 NOTE — Consult Note (Signed)
Sara [name] MRN: [id number] DOB/AGE: [birth year] 35 y.o. Primary Care Physician:No PCP Per Patient Admit date: 07/11/2015 Chief Complaint:  Chief Complaint  Patient presents with  . Dialysis    HPI: Pt is 35 year old female with past medical hx of ESRD who was admitted with c/o dyspnea," I need dialysis "  HPI dates back to few days ago started feeling short of breath,and so pt came to ER asking for her dialysis. Pt had her dialysis tx on last Monday. Pt says " I am still over my dry weight, I need my dialysis ". NO c/o chest pain No c/o fever/cough/chills NO c/o nausea/vomiting/ diarrhea  no c/o hematuria I was called by ER to come see the pt as Pt main concern is "I need my dialysis"  Pt is being seen  for need of renal replacement therapy.  Past Medical History  Diagnosis Date  . Lupus ([place])     lupus w nephritis  . History of blood transfusion     "this is probably my 3rd" (10/09/2014)  . ESRD (end stage renal disease) on dialysis [facility])     "[name]; [facility]" (10/09/2014)  . Bipolar disorder ([place])     [name] 10/09/2014  . Schizophrenia ([place])     [name] 10/09/2014  . Chronic anemia     [name] 10/09/2014  . CHF (congestive heart failure) ([place])     systolic/notes [id number]  . Acute myopericarditis     hx/notes 10/09/2014  . Psychosis   . Hypertension   . Pregnancy   . Low back pain   . Positive ANA (antinuclear antibody) 08/16/2012  . 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 Tobacco abuse 02/20/2014  . Schizoaffective disorder, bipolar type ([place]) 11/20/2014  . Non-compliant patient         Family History  Problem Relation Age of Onset  . Drug abuse Father   . Kidney disease Father     Social History:  reports that she has been smoking Cigarettes.  She has a 4 pack-year smoking history. She uses smokeless tobacco. She reports that she drinks about 4.2 oz of alcohol per week. She reports that she uses illicit drugs (Marijuana and  Cocaine).   Allergies:  Allergies  Allergen Reactions  . Ativan [Lorazepam] Other (See Comments)    Dysarthria(patient has difficulty speaking and slurred speech); denies swelling, itching, pain, or numbness.  [name] [Ziprasidone Hcl] Itching and Swelling    Tongue swelling  . Keflex [Cephalexin] Swelling and Other (See Comments)    Tongue swelling. Can't talk   . Haldol [Haloperidol Lactate] Swelling    Tongue swelling. 05/31/15 - MD ok with giving as pt has tolerated in the past Pt can take benadryl.  . Other Itching    wool     (Not in a hospital admission)     [id number] from the symptoms mentioned above,there are no other symptoms referable to all systems reviewed.      Physical Exam: Vital signs in last 24 hours: Temp:  [98.1 F (36.7 C)] 98.1 F (36.7 C) (04/05 0837) Pulse Rate:  [114] 114 (04/05 0837) Resp:  [18] 18 (04/05 0837) BP: (154)/(125) 154/125 mmHg (04/05 0837) SpO2:  [100 %] 100 % (04/05 0837) Weight:  [202 lb 9.6 oz (91.899 kg)] 202 lb 9.6 oz (91.899 kg) (04/05 0837) Weight change:     Intake/Output from previous day:       Physical Exam: General- pt is awake,alert, follows coomands Resp-  No acute REsp distress,  decreased bs at bases. CVS- S1S2 regular in rate and rhythm, NO rubs  GIT- BS+, soft, NT, ND EXT- 2+ LE Edema, NO Cyanosis CNS- CN 2-12 grossly intact. Moving all 4 extremities Access- AVF+, aneurysm present   Lab Results:  CBC    Component Value Date/Time   WBC 6.7 07/08/2015 1502   RBC 3.16* 07/08/2015 1502   RBC 2.77* 06/21/2015 1022   HGB 8.7* 07/08/2015 1502   HCT 28.0* 07/08/2015 1502   PLT 127* 07/08/2015 1502   MCV 88.6 07/08/2015 1502   MCH 27.5 07/08/2015 1502   MCHC 31.1 07/08/2015 1502   RDW 19.1* 07/08/2015 1502   LYMPHSABS 1.3 07/08/2015 1502   MONOABS 0.6 07/08/2015 1502   EOSABS 0.1 07/08/2015 1502   BASOSABS 0.0 07/08/2015 1502      BMET  Recent Labs  07/08/15 1502  NA 139  K 5.4*  CL  100*  CO2 21*  GLUCOSE 82  BUN 101*  CREATININE 10.26*  CALCIUM 9.9    MICRO Recent Results (from the past 240 hour(s))  MRSA PCR Screening     Status: None   Collection Time: 07/02/15 12:00 PM  Result Value Ref Range Status   MRSA by PCR NEGATIVE NEGATIVE Final    Comment:        The GeneXpert MRSA Assay (FDA approved for NASAL specimens only), is one component of a comprehensive MRSA colonization surveillance program. It is not intended to diagnose MRSA infection nor to guide or monitor treatment for MRSA infections.       Lab Results  Component Value Date   PTH 420* 05/26/2015   CALCIUM 9.9 07/08/2015   CAION 1.15 06/29/2015   PHOS 6.7* 07/02/2015      Impression: 1)Renal  ESRD on HD                Pt is not in regular  Schedule sec to her complaince/adherence issues                Pt last HD was on Monday                Will dialyze pt today.   2)HTN Bp  at goal  3)Anemia In ESRD the goal for HGb is 9--11. Pt HGb is not at goal Will keep on epo   4)CKD Mineral-Bone Disorder PTH acceptable. Secondary Hyperparathyroidism  Present. Phosphorus not at goal.  on binders  5)Psych .  Hx of   psycosis Schizophrenia Primary MD following  6)Electrolytes  Hyperkalemic   Sec to non adherence to Hd tx.  NOrmonatremic   7)Acid base Co2  at goal     Plan:  Will dialyze today Will keep on epo Will use 2 k bath Will try to take 3 liters off      Raffaela Ladley S 07/11/2015, 10:59 AM

## 2015-07-11 NOTE — Progress Notes (Signed)
Pt. Left AMA. Pt. States her bus picks her up at [facility] at 1800.

## 2015-07-11 NOTE — Procedures (Signed)
   HEMODIALYSIS TREATMENT NOTE:  4 hour heparin-free dialysis completed via right upper arm AVF (15g ante/retrograde). Goal met: 4 liters removed without interruption in ultrafiltration. All blood was returned and hemostasis was achieved within 10 minutes. Report given to Rosealee Albee, RN.  Rockwell Alexandria, RN, CDN
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
## 2015-07-11 NOTE — Discharge Summary (Signed)
Physician Discharge Summary  Sara [name] [name] DOB: 31-Aug-1980 DOA: 07/11/2015  PCP: No PCP Per Patient  Admit date: 07/11/2015 Discharge date: 07/11/2015  Time spent: 35 minutes  Recommendations for Outpatient Follow-up:  -We be discharged home today.   Discharge Diagnoses:  Active Problems:   ESRD on dialysis [facility])   ESRD (end stage renal disease) ([place])   Discharge Condition: Stable and improved  Filed Weights   07/11/15 0837 07/11/15 1240  Weight: 91.899 kg (202 lb 9.6 oz) 91.9 kg (202 lb 9.6 oz)    History of present illness:  Sara [name] is a 35 y.o. female well known to the system with hx of ESRD, gross noncompliance, HTN presents with needing an HD treatment. She was last dialyzed on Monday, April 3. Pt was recently d/c'd from [facility] for needing HD. Nephrology consulted. Hospitalist consulted for admission. Already been seen by nephrology and dialysis orders have been placed.  Hospital Course:   End-stage renal disease -Patient presented to hospital for her scheduled hemodialysis. -Was seen by nephrology, dialysis orders placed, no eventuality so will be discharged home.  Procedures:  None   Consultations:  Nephrology  Discharge Instructions     Medication List    ASK your doctor about these medications        acetaminophen 500 MG tablet  Commonly known as:  TYLENOL  Take 1,000 mg by mouth every 6 (six) hours as needed for moderate pain. Reported on 06/12/2015     fluticasone 50 MCG/ACT nasal spray  Commonly known as:  FLONASE  Place 2 sprays into both nostrils daily. Use for 5 days then stop.     furosemide 40 MG tablet  Commonly known as:  LASIX  Take 3 tablets (120 mg total) by mouth daily.     guaiFENesin 600 MG 12 hr tablet  Commonly known as:  MUCINEX  Take 1 tablet (600 mg total) by mouth 2 (two) times daily. Take for 4 days then stop.     loratadine 10 MG tablet  Commonly known as:  CLARITIN  Take 1 tablet (10 mg  total) by mouth daily. Take daily.     metoprolol 50 MG tablet  Commonly known as:  LOPRESSOR  Take 1 tablet (50 mg total) by mouth 2 (two) times daily.     naproxen sodium 220 MG tablet  Commonly known as:  ANAPROX  Take 220 mg by mouth daily as needed (pain).     predniSONE 20 MG tablet  Commonly known as:  DELTASONE  Take 1 tablet (20 mg total) by mouth daily with breakfast.     traMADol 50 MG tablet  Commonly known as:  ULTRAM  Take 50 mg by mouth every 8 (eight) hours as needed.       Allergies  Allergen Reactions  . Ativan [Lorazepam] Other (See Comments)    Dysarthria(patient has difficulty speaking and slurred speech); denies swelling, itching, pain, or numbness.  [name] [Ziprasidone Hcl] Itching and Swelling    Tongue swelling  . Keflex [Cephalexin] Swelling and Other (See Comments)    Tongue swelling. Can't talk   . Haldol [Haloperidol Lactate] Swelling    Tongue swelling. 05/31/15 - MD ok with giving as pt has tolerated in the past Pt can take benadryl.  . Other Itching    wool      The results of significant diagnostics from this hospitalization (including imaging, microbiology, ancillary and laboratory) are listed below for reference.    Significant  Diagnostic Studies: Dg Chest 2 View  07/08/2015  CLINICAL DATA:  Left-sided chest pain, shortness of breath, productive cough. EXAM: CHEST  2 VIEW COMPARISON:  July 02, 2015.  February 07, 2015. FINDINGS: Moderate cardiomegaly is noted. No pneumothorax or pleural effusion is noted. Stable mild central pulmonary vascular congestion is noted. Stable chronic resorption of both distal clavicles is noted. IMPRESSION: Stable cardiomegaly and mild central pulmonary vascular congestion. Electronically Signed   By: Marijo Conception, M.D.   On: 07/08/2015 13:32   Dg Chest 2 View  07/02/2015  CLINICAL DATA:  Shortness of breath for 5 days. Cough. History of renal failure EXAM: CHEST  2 VIEW COMPARISON:  June 14, 2015 FINDINGS:  There is no edema or consolidation. There is moderate cardiac enlargement. The pulmonary vascular is normal. No adenopathy. No bone lesions. IMPRESSION: Moderate cardiac enlargement. Question underlying pericardial effusion. No edema or consolidation. Electronically Signed   By: Lowella Grip III M.D.   On: 07/02/2015 11:28   Dg Chest 2 View  06/12/2015  CLINICAL DATA:  Shortness of breath for 5 days. EXAM: CHEST  2 VIEW COMPARISON:  Radiographs 06/08/2015, multiple priors. FINDINGS: Cardiomegaly is stable. Vascular congestion again seen. Small pleural effusions, with minimal fluid in the minor fissure. No confluent airspace disease or pneumothorax. Resorption of both distal clavicle again seen. IMPRESSION: Chronic cardiomegaly, vascular congestion, and small pleural effusions. Electronically Signed   By: Jeb Levering M.D.   On: 06/12/2015 00:07   Dg Chest Portable 1 View  06/14/2015  CLINICAL DATA:  Increased lower extremity edema in shortness of breath, dialysis patient EXAM: PORTABLE CHEST 1 VIEW COMPARISON:  06/11/2015 FINDINGS: Severe cardiac enlargement stable. Central vascular congestion. Small right pleural effusion intercalating into the minor fissure. Probable tiny left effusion. No definite pulmonary edema. IMPRESSION: Chronic cardiac enlargement and vascular congestion with small pleural effusions Electronically Signed   By: Skipper Cliche M.D.   On: 06/14/2015 17:39    Microbiology: Recent Results (from the past 240 hour(s))  MRSA PCR Screening     Status: None   Collection Time: 07/02/15 12:00 PM  Result Value Ref Range Status   MRSA by PCR NEGATIVE NEGATIVE Final    Comment:        The GeneXpert MRSA Assay (FDA approved for NASAL specimens only), is one component of a comprehensive MRSA colonization surveillance program. It is not intended to diagnose MRSA infection nor to guide or monitor treatment for MRSA infections.      Labs: Basic Metabolic Panel:  Recent  Labs Lab 07/08/15 1502 07/11/15 1453  NA 139  --   K 5.4*  --   CL 100*  --   CO2 21*  --   GLUCOSE 82  --   BUN 101*  --   CREATININE 10.26* 10.18*  CALCIUM 9.9  --    Liver Function Tests:  Recent Labs Lab 07/08/15 1502  AST 27  ALT 38  ALKPHOS 90  BILITOT 1.0  PROT 6.9  ALBUMIN 3.3*   No results for input(s): LIPASE, AMYLASE in the last 168 hours. No results for input(s): AMMONIA in the last 168 hours. CBC:  Recent Labs Lab 07/08/15 1502 07/11/15 1453  WBC 6.7 5.5  NEUTROABS 4.7  --   HGB 8.7* 8.2*  HCT 28.0* 25.4*  MCV 88.6 87.6  PLT 127* 129*   Cardiac Enzymes: No results for input(s): CKTOTAL, CKMB, CKMBINDEX, TROPONINI in the last 168 hours. BNP: BNP (last 3 results)  Recent Labs  09/11/14 0815  BNP 1694.9*    ProBNP (last 3 results) No results for input(s): PROBNP in the last 8760 hours.  CBG: No results for input(s): GLUCAP in the last 168 hours.     SignedLelon Frohlich  [facility] Pager: [phone] 07/11/2015, 5:19 PM

## 2015-07-13 ENCOUNTER — Observation Stay (HOSPITAL_COMMUNITY)
Admission: EM | Admit: 2015-07-13 | Discharge: 2015-07-13 | Disposition: A | Discharge status: Home / Self Care | Payer: Medicare Other | Attending: Internal Medicine | Admitting: Internal Medicine

## 2015-07-13 ENCOUNTER — Encounter (HOSPITAL_COMMUNITY): Payer: Self-pay | Admitting: Emergency Medicine

## 2015-07-13 DIAGNOSIS — F17210 Nicotine dependence, cigarettes, uncomplicated: Secondary | ICD-10-CM | POA: Insufficient documentation

## 2015-07-13 DIAGNOSIS — Z791 Long term (current) use of non-steroidal anti-inflammatories (NSAID): Secondary | ICD-10-CM | POA: Insufficient documentation

## 2015-07-13 DIAGNOSIS — N186 End stage renal disease: Secondary | ICD-10-CM | POA: Insufficient documentation

## 2015-07-13 DIAGNOSIS — I509 Heart failure, unspecified: Secondary | ICD-10-CM | POA: Insufficient documentation

## 2015-07-13 DIAGNOSIS — I132 Hypertensive heart and chronic kidney disease with heart failure and with stage 5 chronic kidney disease, or end stage renal disease: Secondary | ICD-10-CM | POA: Diagnosis not present

## 2015-07-13 DIAGNOSIS — F250 Schizoaffective disorder, bipolar type: Secondary | ICD-10-CM | POA: Diagnosis not present

## 2015-07-13 DIAGNOSIS — Z79899 Other long term (current) drug therapy: Secondary | ICD-10-CM | POA: Diagnosis not present

## 2015-07-13 DIAGNOSIS — Z992 Dependence on renal dialysis: Secondary | ICD-10-CM | POA: Insufficient documentation

## 2015-07-13 DIAGNOSIS — I120 Hypertensive chronic kidney disease with stage 5 chronic kidney disease or end stage renal disease: Secondary | ICD-10-CM | POA: Diagnosis not present

## 2015-07-13 LAB — CBC
HCT: 25.7 % — ABNORMAL LOW (ref 36.0–46.0)
Hemoglobin: 8.5 g/dL — ABNORMAL LOW (ref 12.0–15.0)
MCH: 29.2 pg (ref 26.0–34.0)
MCHC: 33.1 g/dL (ref 30.0–36.0)
MCV: 88.3 fL (ref 78.0–100.0)
PLATELETS: 197 10*3/uL (ref 150–400)
RBC: 2.91 MIL/uL — ABNORMAL LOW (ref 3.87–5.11)
RDW: 18.3 % — ABNORMAL HIGH (ref 11.5–15.5)
WBC: 6.5 10*3/uL (ref 4.0–10.5)

## 2015-07-13 LAB — BASIC METABOLIC PANEL
ANION GAP: 17 — AB (ref 5–15)
BUN: 74 mg/dL — ABNORMAL HIGH (ref 6–20)
CALCIUM: 9 mg/dL (ref 8.9–10.3)
CO2: 25 mmol/L (ref 22–32)
Chloride: 97 mmol/L — ABNORMAL LOW (ref 101–111)
Creatinine, Ser: 8.93 mg/dL — ABNORMAL HIGH (ref 0.44–1.00)
GFR, EST AFRICAN AMERICAN: 6 mL/min — AB (ref >60)
GFR, EST NON AFRICAN AMERICAN: 5 mL/min — AB (ref >60)
GLUCOSE: 100 mg/dL — AB (ref 65–99)
Potassium: 4.6 mmol/L (ref 3.5–5.1)
SODIUM: 139 mmol/L (ref 135–145)

## 2015-07-13 MED ORDER — HEPARIN SODIUM (PORCINE) 1000 UNIT/ML IJ SOLN
INTRAMUSCULAR | Status: AC
  Administered 2015-07-13: 1000 [IU] via INTRAVENOUS_CENTRAL
  Filled 2015-07-13: qty 5

## 2015-07-13 MED ORDER — TRAMADOL HCL 50 MG PO TABS
ORAL_TABLET | ORAL | Status: AC
  Administered 2015-07-13: 50 mg via ORAL
  Filled 2015-07-13: qty 1

## 2015-07-13 MED ORDER — HEPARIN SODIUM (PORCINE) 1000 UNIT/ML DIALYSIS
1000.0000 [IU] | INTRAMUSCULAR | Status: DC | PRN
  Filled 2015-07-13: qty 1

## 2015-07-13 MED ORDER — ENOXAPARIN SODIUM 30 MG/0.3ML ~~LOC~~ SOLN
30.0000 mg | SUBCUTANEOUS | Status: DC
Start: 2015-07-13 — End: 2015-07-13

## 2015-07-13 MED ORDER — DIPHENHYDRAMINE HCL 25 MG PO CAPS
ORAL_CAPSULE | ORAL | Status: AC
  Administered 2015-07-13: 25 mg via ORAL
  Filled 2015-07-13: qty 1

## 2015-07-13 MED ORDER — LIDOCAINE HCL (PF) 1 % IJ SOLN
5.0000 mL | INTRAMUSCULAR | Status: DC | PRN
  Administered 2015-07-13: 0.5 mL via INTRADERMAL

## 2015-07-13 MED ORDER — PREDNISONE 20 MG PO TABS: 20.0000 mg | ORAL_TABLET | Freq: Every day | ORAL | Status: DC

## 2015-07-13 MED ORDER — FUROSEMIDE 80 MG PO TABS: 120.0000 mg | ORAL_TABLET | Freq: Every day | ORAL | Status: DC

## 2015-07-13 MED ORDER — DIPHENHYDRAMINE HCL 25 MG PO CAPS
25.0000 mg | ORAL_CAPSULE | Freq: Four times a day (QID) | ORAL | Status: DC | PRN
  Administered 2015-07-13: 25 mg via ORAL

## 2015-07-13 MED ORDER — TRAMADOL HCL 50 MG PO TABS
50.0000 mg | ORAL_TABLET | Freq: Once | ORAL | Status: AC
  Administered 2015-07-13: 50 mg via ORAL

## 2015-07-13 MED ORDER — SODIUM CHLORIDE 0.9 % IV SOLN: 100.0000 mL | INTRAVENOUS | Status: DC | PRN

## 2015-07-13 MED ORDER — HEPARIN SODIUM (PORCINE) 1000 UNIT/ML DIALYSIS
1000.0000 [IU] | INTRAMUSCULAR | Status: DC | PRN
  Administered 2015-07-13: 1000 [IU] via INTRAVENOUS_CENTRAL
  Filled 2015-07-13 (×2): qty 1

## 2015-07-13 MED ORDER — GUAIFENESIN ER 600 MG PO TB12: 600.0000 mg | ORAL_TABLET | Freq: Two times a day (BID) | ORAL | Status: DC

## 2015-07-13 MED ORDER — PENTAFLUOROPROP-TETRAFLUOROETH EX AERO: 1.0000 "application " | INHALATION_SPRAY | CUTANEOUS | Status: DC | PRN

## 2015-07-13 MED ORDER — LORATADINE 10 MG PO TABS: 10.0000 mg | ORAL_TABLET | Freq: Every day | ORAL | Status: DC

## 2015-07-13 MED ORDER — ACETAMINOPHEN 500 MG PO TABS: 1000.0000 mg | ORAL_TABLET | Freq: Four times a day (QID) | ORAL | Status: DC | PRN

## 2015-07-13 MED ORDER — METOPROLOL TARTRATE 50 MG PO TABS: 50.0000 mg | ORAL_TABLET | Freq: Two times a day (BID) | ORAL | Status: DC

## 2015-07-13 MED ORDER — LIDOCAINE HCL (PF) 1 % IJ SOLN
INTRAMUSCULAR | Status: AC
  Administered 2015-07-13: 0.5 mL via INTRADERMAL
  Filled 2015-07-13: qty 5

## 2015-07-13 MED ORDER — LIDOCAINE-PRILOCAINE 2.5-2.5 % EX CREA: 1.0000 "application " | TOPICAL_CREAM | CUTANEOUS | Status: DC | PRN

## 2015-07-13 NOTE — ED Notes (Signed)
Assumed care of patient from [place], [name].
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
## 2015-07-13 NOTE — ED Notes (Signed)
PT requesting dialysis admission and last dialysis was 07/11/15 and completed 4hrs.

## 2015-07-13 NOTE — Procedures (Signed)
   HEMODIALYSIS TREATMENT NOTE:  4.5 hour low-heparin dialysis completed via right upper arm aVF . Goal met: 4 liters removed; no interruption in ultrafiltration. All blood returned. Hemostasis achieved in 10 min. Report given to Russ Halo, RN.  Rockwell Alexandria, RN, CDN

## 2015-07-13 NOTE — Discharge Summary (Signed)
Physician Discharge Summary  [name] [name] DOB: May 31, 1980 DOA: 07/13/2015  PCP: No PCP Per Patient  Admit date: 07/13/2015 Discharge date: 07/13/2015  Time spent: 45 minutes  Recommendations for Outpatient Follow-up:  - Will be discharged home today Discharge Diagnoses:  Active Problems:   ESRD (end stage renal disease) ([place])   ESRD on hemodialysis [facility])   Discharge Condition: Stable and improved  Filed Weights   07/13/15 1020 07/13/15 1310  Weight: 89.444 kg (197 lb 3 oz) 89.676 kg (197 lb 11.2 oz)    History of present illness:  Sara [name] is a 35 y.o. female well known to the system with hx of ESRD, gross noncompliance, HTN presents with needing an HD treatment. She was last dialyzed on Wednesday, April 5. Pt was recently d/c'd from [facility] for needing HD. Nephrology consulted. Hospitalist consulted for admission. Already been seen by nephrology and dialysis orders have been placed.   Hospital Course:   End-stage renal disease -Patient presented to the hospital for her scheduled hemodialysis. -Was seen by nephrology, dialysis orders placed, no eventuality so she will be discharged home.  Procedures:  Dialysis   Consultations:  Nephrology  Discharge Instructions  Discharge Instructions    Diet - low sodium heart healthy    Complete by:  As directed      Increase activity slowly    Complete by:  As directed             Medication List    TAKE these medications        acetaminophen 500 MG tablet  Commonly known as:  TYLENOL  Take 1,000 mg by mouth every 6 (six) hours as needed for moderate pain. Reported on 06/12/2015     furosemide 40 MG tablet  Commonly known as:  LASIX  Take 3 tablets (120 mg total) by mouth daily.     guaiFENesin 600 MG 12 hr tablet  Commonly known as:  MUCINEX  Take 1 tablet (600 mg total) by mouth 2 (two) times daily. Take for 4 days then stop.     loratadine 10 MG tablet  Commonly known as:  CLARITIN    Take 1 tablet (10 mg total) by mouth daily. Take daily.     metoprolol 50 MG tablet  Commonly known as:  LOPRESSOR  Take 1 tablet (50 mg total) by mouth 2 (two) times daily.     naproxen sodium 220 MG tablet  Commonly known as:  ANAPROX  Take 220 mg by mouth daily as needed (pain).     predniSONE 20 MG tablet  Commonly known as:  DELTASONE  Take 1 tablet (20 mg total) by mouth daily with breakfast.       Allergies  Allergen Reactions  . Ativan [Lorazepam] Other (See Comments)    Dysarthria(patient has difficulty speaking and slurred speech); denies swelling, itching, pain, or numbness.  [name] [Ziprasidone Hcl] Itching and Swelling    Tongue swelling  . Keflex [Cephalexin] Swelling and Other (See Comments)    Tongue swelling. Can't talk   . Haldol [Haloperidol Lactate] Swelling    Tongue swelling. 05/31/15 - MD ok with giving as pt has tolerated in the past Pt can take benadryl.  . Other Itching    wool      The results of significant diagnostics from this hospitalization (including imaging, microbiology, ancillary and laboratory) are listed below for reference.    Significant Diagnostic Studies: Dg Chest 2 View  07/08/2015  CLINICAL DATA:  Left-sided chest pain, shortness of breath, productive cough. EXAM: CHEST  2 VIEW COMPARISON:  July 02, 2015.  February 07, 2015. FINDINGS: Moderate cardiomegaly is noted. No pneumothorax or pleural effusion is noted. Stable mild central pulmonary vascular congestion is noted. Stable chronic resorption of both distal clavicles is noted. IMPRESSION: Stable cardiomegaly and mild central pulmonary vascular congestion. Electronically Signed   By: Marijo Conception, M.D.   On: 07/08/2015 13:32   Dg Chest 2 View  07/02/2015  CLINICAL DATA:  Shortness of breath for 5 days. Cough. History of renal failure EXAM: CHEST  2 VIEW COMPARISON:  June 14, 2015 FINDINGS: There is no edema or consolidation. There is moderate cardiac enlargement. The pulmonary  vascular is normal. No adenopathy. No bone lesions. IMPRESSION: Moderate cardiac enlargement. Question underlying pericardial effusion. No edema or consolidation. Electronically Signed   By: Lowella Grip III M.D.   On: 07/02/2015 11:28   Dg Chest Portable 1 View  06/14/2015  CLINICAL DATA:  Increased lower extremity edema in shortness of breath, dialysis patient EXAM: PORTABLE CHEST 1 VIEW COMPARISON:  06/11/2015 FINDINGS: Severe cardiac enlargement stable. Central vascular congestion. Small right pleural effusion intercalating into the minor fissure. Probable tiny left effusion. No definite pulmonary edema. IMPRESSION: Chronic cardiac enlargement and vascular congestion with small pleural effusions Electronically Signed   By: Skipper Cliche M.D.   On: 06/14/2015 17:39    Microbiology: No results found for this or any previous visit (from the past 240 hour(s)).   Labs: Basic Metabolic Panel:  Recent Labs Lab 07/08/15 1502 07/11/15 1453 07/13/15 1030  NA 139  --  139  K 5.4*  --  4.6  CL 100*  --  97*  CO2 21*  --  25  GLUCOSE 82  --  100*  BUN 101*  --  74*  CREATININE 10.26* 10.18* 8.93*  CALCIUM 9.9  --  9.0   Liver Function Tests:  Recent Labs Lab 07/08/15 1502  AST 27  ALT 38  ALKPHOS 90  BILITOT 1.0  PROT 6.9  ALBUMIN 3.3*   No results for input(s): LIPASE, AMYLASE in the last 168 hours. No results for input(s): AMMONIA in the last 168 hours. CBC:  Recent Labs Lab 07/08/15 1502 07/11/15 1453  WBC 6.7 5.5  NEUTROABS 4.7  --   HGB 8.7* 8.2*  HCT 28.0* 25.4*  MCV 88.6 87.6  PLT 127* 129*   Cardiac Enzymes: No results for input(s): CKTOTAL, CKMB, CKMBINDEX, TROPONINI in the last 168 hours. BNP: BNP (last 3 results)  Recent Labs  09/11/14 0815  BNP 1694.9*    ProBNP (last 3 results) No results for input(s): PROBNP in the last 8760 hours.  CBG: No results for input(s): GLUCAP in the last 168 hours.     SignedLelon Frohlich  [facility] Pager: [phone] 07/13/2015, 5:23 PM
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
## 2015-07-13 NOTE — Progress Notes (Signed)
Patient discharged home after dialysis.  Patient's BP elevated, but refuses medications at this time.  States "I'll take it after I eat at home." Ambulated off floor in no acute distress.

## 2015-07-13 NOTE — ED Notes (Addendum)
Pt refusing IV access at this time. States she is going home after HD, notified EDP.

## 2015-07-17 ENCOUNTER — Emergency Department (HOSPITAL_COMMUNITY)
Admission: EM | Admit: 2015-07-17 | Discharge: 2015-07-17 | Disposition: A | Discharge status: Home / Self Care | Payer: Medicare Other | Attending: Emergency Medicine | Admitting: Emergency Medicine

## 2015-07-17 ENCOUNTER — Encounter (HOSPITAL_COMMUNITY): Payer: Self-pay | Admitting: Emergency Medicine

## 2015-07-17 DIAGNOSIS — I120 Hypertensive chronic kidney disease with stage 5 chronic kidney disease or end stage renal disease: Secondary | ICD-10-CM | POA: Insufficient documentation

## 2015-07-17 DIAGNOSIS — F17210 Nicotine dependence, cigarettes, uncomplicated: Secondary | ICD-10-CM | POA: Diagnosis not present

## 2015-07-17 DIAGNOSIS — I509 Heart failure, unspecified: Secondary | ICD-10-CM | POA: Insufficient documentation

## 2015-07-17 DIAGNOSIS — Z7952 Long term (current) use of systemic steroids: Secondary | ICD-10-CM | POA: Diagnosis not present

## 2015-07-17 DIAGNOSIS — Z992 Dependence on renal dialysis: Secondary | ICD-10-CM | POA: Diagnosis not present

## 2015-07-17 DIAGNOSIS — R2232 Localized swelling, mass and lump, left upper limb: Secondary | ICD-10-CM | POA: Diagnosis not present

## 2015-07-17 DIAGNOSIS — Z79899 Other long term (current) drug therapy: Secondary | ICD-10-CM | POA: Insufficient documentation

## 2015-07-17 DIAGNOSIS — Z862 Personal history of diseases of the blood and blood-forming organs and certain disorders involving the immune mechanism: Secondary | ICD-10-CM | POA: Diagnosis not present

## 2015-07-17 DIAGNOSIS — N186 End stage renal disease: Secondary | ICD-10-CM | POA: Diagnosis not present

## 2015-07-17 DIAGNOSIS — L739 Follicular disorder, unspecified: Secondary | ICD-10-CM | POA: Insufficient documentation

## 2015-07-17 DIAGNOSIS — M25522 Pain in left elbow: Secondary | ICD-10-CM | POA: Diagnosis not present

## 2015-07-17 DIAGNOSIS — Z8659 Personal history of other mental and behavioral disorders: Secondary | ICD-10-CM | POA: Insufficient documentation

## 2015-07-17 DIAGNOSIS — Z4931 Encounter for adequacy testing for hemodialysis: Secondary | ICD-10-CM | POA: Diagnosis not present

## 2015-07-17 LAB — CBC
HCT: 25.9 % — ABNORMAL LOW (ref 36.0–46.0)
Hemoglobin: 8.5 g/dL — ABNORMAL LOW (ref 12.0–15.0)
MCH: 28.5 pg (ref 26.0–34.0)
MCHC: 32.8 g/dL (ref 30.0–36.0)
MCV: 86.9 fL (ref 78.0–100.0)
PLATELETS: 208 10*3/uL (ref 150–400)
RBC: 2.98 MIL/uL — ABNORMAL LOW (ref 3.87–5.11)
RDW: 18 % — AB (ref 11.5–15.5)
WBC: 6.6 10*3/uL (ref 4.0–10.5)

## 2015-07-17 LAB — BASIC METABOLIC PANEL
Anion gap: 20 — ABNORMAL HIGH (ref 5–15)
BUN: 93 mg/dL — AB (ref 6–20)
CHLORIDE: 97 mmol/L — AB (ref 101–111)
CO2: 20 mmol/L — ABNORMAL LOW (ref 22–32)
CREATININE: 13.06 mg/dL — AB (ref 0.44–1.00)
Calcium: 9.7 mg/dL (ref 8.9–10.3)
GFR calc Af Amer: 4 mL/min — ABNORMAL LOW (ref >60)
GFR, EST NON AFRICAN AMERICAN: 3 mL/min — AB (ref >60)
Glucose, Bld: 101 mg/dL — ABNORMAL HIGH (ref 65–99)
Potassium: 4.7 mmol/L (ref 3.5–5.1)
SODIUM: 137 mmol/L (ref 135–145)

## 2015-07-17 MED ORDER — ACETAMINOPHEN 325 MG PO TABS
650.0000 mg | ORAL_TABLET | Freq: Once | ORAL | Status: AC
  Administered 2015-07-17: 650 mg via ORAL
  Filled 2015-07-17: qty 2

## 2015-07-17 MED ORDER — CLINDAMYCIN PHOSPHATE 1 % EX GEL: Freq: Two times a day (BID) | CUTANEOUS | Status: DC

## 2015-07-17 NOTE — ED Notes (Signed)
Pt was asleep when i went to re-vitalize.  Pt snoring.

## 2015-07-17 NOTE — ED Notes (Signed)
Pt states she needs dialysis and she's having left heel pain. Last dialysis day was 07/13/15.
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
## 2015-07-17 NOTE — Discharge Instructions (Signed)
Folliculitis °Folliculitis is redness, soreness, and swelling (inflammation) of the hair follicles. This condition can occur anywhere on the body. People with weakened immune systems, diabetes, or obesity have a greater risk of getting folliculitis. °CAUSES °· Bacterial infection. This is the most common cause. °· Fungal infection. °· Viral infection. °· Contact with certain chemicals, especially oils and tars. °Long-term folliculitis can result from bacteria that live in the nostrils. The bacteria may trigger multiple outbreaks of folliculitis over time. °SYMPTOMS °Folliculitis most commonly occurs on the scalp, thighs, legs, back, buttocks, and areas where hair is shaved frequently. An early sign of folliculitis is a small, white or yellow, pus-filled, itchy lesion (pustule). These lesions appear on a red, inflamed follicle. They are usually less than 0.2 inches (5 mm) wide. When there is an infection of the follicle that goes deeper, it becomes a boil or furuncle. A group of closely packed boils creates a larger lesion (carbuncle). Carbuncles tend to occur in hairy, sweaty areas of the body. °DIAGNOSIS  °Your caregiver can usually tell what is wrong by doing a physical exam. A sample may be taken from one of the lesions and tested in a lab. This can help determine what is causing your folliculitis. °TREATMENT  °Treatment may include: °· Applying warm compresses to the affected areas. °· Taking antibiotic medicines orally or applying them to the skin. °· Draining the lesions if they contain a large amount of pus or fluid. °· Laser hair removal for cases of long-lasting folliculitis. This helps to prevent regrowth of the hair. °HOME CARE INSTRUCTIONS °· Apply warm compresses to the affected areas as directed by your caregiver. °· If antibiotics are prescribed, take them as directed. Finish them even if you start to feel better. °· You may take over-the-counter medicines to relieve itching. °· Do not shave irritated  skin. °· Follow up with your caregiver as directed. °SEEK IMMEDIATE MEDICAL CARE IF:  °· You have increasing redness, swelling, or pain in the affected area. °· You have a fever. °MAKE SURE YOU: °· Understand these instructions. °· Will watch your condition. °· Will get help right away if you are not doing well or get worse. °  °This information is not intended to replace advice given to you by your health care provider. Make sure you discuss any questions you have with your health care provider. °  °Document Released: 06/02/2001 Document Revised: 04/14/2014 Document Reviewed: 06/24/2011 °Elsevier Interactive Patient Education ©2016 Else[employer] Inc. ° °

## 2015-07-17 NOTE — ED Notes (Signed)
Warm blankets given to patient.

## 2015-07-18 ENCOUNTER — Encounter (HOSPITAL_COMMUNITY): Payer: Self-pay | Admitting: Emergency Medicine

## 2015-07-18 ENCOUNTER — Encounter (HOSPITAL_COMMUNITY): Payer: Self-pay

## 2015-07-18 ENCOUNTER — Emergency Department (HOSPITAL_COMMUNITY)
Admission: EM | Admit: 2015-07-18 | Discharge: 2015-07-18 | Disposition: A | Discharge status: Home / Self Care | Payer: Medicare Other | Attending: Emergency Medicine | Admitting: Emergency Medicine

## 2015-07-18 ENCOUNTER — Observation Stay (EMERGENCY_DEPARTMENT_HOSPITAL)
Admission: EM | Admit: 2015-07-18 | Discharge: 2015-07-19 | Discharge status: Against Medical Advice | Payer: Medicare Other | Source: Home / Self Care | Attending: Emergency Medicine | Admitting: Emergency Medicine

## 2015-07-18 DIAGNOSIS — M329 Systemic lupus erythematosus, unspecified: Secondary | ICD-10-CM | POA: Insufficient documentation

## 2015-07-18 DIAGNOSIS — Z79899 Other long term (current) drug therapy: Secondary | ICD-10-CM | POA: Insufficient documentation

## 2015-07-18 DIAGNOSIS — Z862 Personal history of diseases of the blood and blood-forming organs and certain disorders involving the immune mechanism: Secondary | ICD-10-CM

## 2015-07-18 DIAGNOSIS — N186 End stage renal disease: Secondary | ICD-10-CM

## 2015-07-18 DIAGNOSIS — R000 Tachycardia, unspecified: Secondary | ICD-10-CM | POA: Insufficient documentation

## 2015-07-18 DIAGNOSIS — E875 Hyperkalemia: Secondary | ICD-10-CM | POA: Diagnosis not present

## 2015-07-18 DIAGNOSIS — F250 Schizoaffective disorder, bipolar type: Secondary | ICD-10-CM | POA: Diagnosis not present

## 2015-07-18 DIAGNOSIS — Z7952 Long term (current) use of systemic steroids: Secondary | ICD-10-CM | POA: Insufficient documentation

## 2015-07-18 DIAGNOSIS — I150 Renovascular hypertension: Secondary | ICD-10-CM | POA: Diagnosis not present

## 2015-07-18 DIAGNOSIS — Z992 Dependence on renal dialysis: Secondary | ICD-10-CM | POA: Insufficient documentation

## 2015-07-18 DIAGNOSIS — Z4931 Encounter for adequacy testing for hemodialysis: Secondary | ICD-10-CM | POA: Diagnosis present

## 2015-07-18 DIAGNOSIS — I120 Hypertensive chronic kidney disease with stage 5 chronic kidney disease or end stage renal disease: Secondary | ICD-10-CM

## 2015-07-18 DIAGNOSIS — R0602 Shortness of breath: Secondary | ICD-10-CM | POA: Insufficient documentation

## 2015-07-18 DIAGNOSIS — I509 Heart failure, unspecified: Secondary | ICD-10-CM | POA: Insufficient documentation

## 2015-07-18 DIAGNOSIS — F17210 Nicotine dependence, cigarettes, uncomplicated: Secondary | ICD-10-CM | POA: Insufficient documentation

## 2015-07-18 DIAGNOSIS — I132 Hypertensive heart and chronic kidney disease with heart failure and with stage 5 chronic kidney disease, or end stage renal disease: Secondary | ICD-10-CM | POA: Insufficient documentation

## 2015-07-18 DIAGNOSIS — I10 Essential (primary) hypertension: Secondary | ICD-10-CM | POA: Diagnosis present

## 2015-07-18 DIAGNOSIS — Z8659 Personal history of other mental and behavioral disorders: Secondary | ICD-10-CM

## 2015-07-18 LAB — I-STAT CHEM 8, ED
BUN: 93 mg/dL — AB (ref 6–20)
BUN: 99 mg/dL — AB (ref 6–20)
CALCIUM ION: 1.05 mmol/L — AB (ref 1.12–1.23)
CALCIUM ION: 1.08 mmol/L — AB (ref 1.12–1.23)
CREATININE: 15.5 mg/dL — AB (ref 0.44–1.00)
Chloride: 99 mmol/L — ABNORMAL LOW (ref 101–111)
Chloride: 99 mmol/L — ABNORMAL LOW (ref 101–111)
Creatinine, Ser: 14.8 mg/dL — ABNORMAL HIGH (ref 0.44–1.00)
GLUCOSE: 119 mg/dL — AB (ref 65–99)
Glucose, Bld: 82 mg/dL (ref 65–99)
HEMATOCRIT: 30 % — AB (ref 36.0–46.0)
HEMATOCRIT: 31 % — AB (ref 36.0–46.0)
HEMOGLOBIN: 10.2 g/dL — AB (ref 12.0–15.0)
HEMOGLOBIN: 10.5 g/dL — AB (ref 12.0–15.0)
Potassium: 4.9 mmol/L (ref 3.5–5.1)
Potassium: 5.2 mmol/L — ABNORMAL HIGH (ref 3.5–5.1)
SODIUM: 135 mmol/L (ref 135–145)
Sodium: 133 mmol/L — ABNORMAL LOW (ref 135–145)
TCO2: 20 mmol/L (ref 0–100)
TCO2: 19 mmol/L (ref 0–100)

## 2015-07-18 NOTE — ED Notes (Signed)
Pt verbalized understanding of d/c instructionsand follow-up care. No further questions/concerns, VSS, assisted to lobby in wheelchair. 

## 2015-07-18 NOTE — ED Notes (Signed)
Spoke with pt, per Dr. Diddering since pt's K is 4.9, pt will not be dialyzed. Pt upset, wants to see physician.
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
## 2015-07-18 NOTE — Consult Note (Signed)
Sara [name] MRN: [id number] DOB/AGE: 34/19/1982 33 y.o. Primary Care Physician:No PCP Per Patient Admit date: 07/18/2015 Chief Complaint:  Chief Complaint  Patient presents with  . dialysis    HPI: Pt is 35 year old female with past medical hx of ESRD who was admitted with c/o dyspnea," I need dialysis "  HPI dates back to few days ago started feeling short of breath,and so pt came to ER asking for her dialysis. Pt says "[facility] but they said I did not need on, I am still over my dry weight, I need my dialysis ". NO c/o chest pain No c/o fever/cough/chills NO c/o nausea/vomiting/ diarrhea  no c/o hematuria Pt was seen  for need of renal replacement therapy.  Past Medical History  Diagnosis Date  . Lupus ([place])     lupus w nephritis  . History of blood transfusion     "this is probably my 3rd" (10/09/2014)  . ESRD (end stage renal disease) on dialysis [facility])     "[name]; [facility]" (10/09/2014)  . Bipolar disorder ([place])     [name] 10/09/2014  . Schizophrenia ([place])     [name] 10/09/2014  . Chronic anemia     [name] 10/09/2014  . CHF (congestive heart failure) ([place])     systolic/notes [id number]  . Acute myopericarditis     hx/notes 10/09/2014  . Psychosis   . Hypertension   . Pregnancy   . Low back pain   . Positive ANA (antinuclear antibody) 08/16/2012  . 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 Tobacco abuse 02/20/2014  . Schizoaffective disorder, bipolar type ([place]) 11/20/2014  . Non-compliant patient         Family History  Problem Relation Age of Onset  . Drug abuse Father   . Kidney disease Father     Social History:  reports that she has been smoking Cigarettes.  She has a 2 pack-year smoking history. She uses smokeless tobacco. She reports that she drinks about 4.2 oz of alcohol per week. She reports that she uses illicit drugs (Marijuana and Cocaine).   Allergies:  Allergies  Allergen Reactions  . Ativan  [Lorazepam] Swelling and Other (See Comments)    Dysarthria(patient has difficulty speaking and slurred speech); denies swelling, itching, pain, or numbness.  [name] [Ziprasidone Hcl] Itching and Swelling    Tongue swelling  . Keflex [Cephalexin] Swelling and Other (See Comments)    Tongue swelling. Can't talk   . Haldol [Haloperidol Lactate] Swelling    Tongue swelling. 05/31/15 - MD ok with giving as pt has tolerated in the past Pt can take benadryl.  . Other Itching    wool    Medications Prior to Admission  Medication Sig Dispense Refill  . acetaminophen (TYLENOL) 500 MG tablet Take 1,000 mg by mouth every 6 (six) hours as needed for moderate pain. Reported on 06/12/2015    . furosemide (LASIX) 40 MG tablet Take 3 tablets (120 mg total) by mouth daily. 90 tablet 0  . loratadine (CLARITIN) 10 MG tablet Take 1 tablet (10 mg total) by mouth daily. Take daily. 20 tablet 0  . metoprolol (LOPRESSOR) 50 MG tablet Take 1 tablet (50 mg total) by mouth 2 (two) times daily. 60 tablet 0  . predniSONE (DELTASONE) 20 MG tablet Take 1 tablet (20 mg total) by mouth daily with breakfast.    . clindamycin (CLINDAGEL) 1 % gel Apply topically 2 (two) times daily.  30 g 0  . guaiFENesin (MUCINEX) 600 MG 12 hr tablet Take 1 tablet (600 mg total) by mouth 2 (two) times daily. Take for 4 days then stop. (Patient not taking: Reported on 07/18/2015) 8 tablet 0       [id number] from the symptoms mentioned above,there are no other symptoms referable to all systems reviewed.      Physical Exam: Vital signs in last 24 hours: Temp:  [97.9 F (36.6 C)-99.1 F (37.3 C)] 98.9 F (37.2 C) (04/13 0158) Pulse Rate:  [99-116] 102 (04/13 0158) Resp:  [18-20] 20 (04/13 0158) BP: (162-179)/(106-124) 174/121 mmHg (04/13 0158) SpO2:  [96 %-100 %] 97 % (04/13 0158) Weight:  [140 lb (63.504 kg)-197 lb 12 oz (89.7 kg)] 197 lb 12 oz (89.7 kg) (04/13 0158) Weight change:     Intake/Output from previous day:        Physical Exam: General- pt is awake,alert, follows coomands Resp- No acute REsp distress,  decreased bs at bases. CVS- S1S2 regular in rate and rhythm, NO rubs  GIT- BS+, soft, NT, ND EXT- 2+ LE Edema, NO Cyanosis CNS- CN 2-12 grossly intact. Moving all 4 extremities Access- AVF+, aneurysm present   Lab Results:  CBC    Component Value Date/Time   WBC 6.6 07/17/2015 1754   RBC 2.98* 07/17/2015 1754   RBC 2.77* 06/21/2015 1022   HGB 10.5* 07/18/2015 2156   HCT 31.0* 07/18/2015 2156   PLT 208 07/17/2015 1754   MCV 86.9 07/17/2015 1754   MCH 28.5 07/17/2015 1754   MCHC 32.8 07/17/2015 1754   RDW 18.0* 07/17/2015 1754   LYMPHSABS 1.3 07/08/2015 1502   MONOABS 0.6 07/08/2015 1502   EOSABS 0.1 07/08/2015 1502   BASOSABS 0.0 07/08/2015 1502      BMET  Recent Labs  07/17/15 1754 07/18/15 0957 07/18/15 2156 07/19/15 0250  NA 137 135 133*  --   K 4.7 4.9 5.2* 5.1  CL 97* 99* 99*  --   CO2 20*  --   --   --   GLUCOSE 101* 82 119*  --   BUN 93* 93* 99*  --   CREATININE 13.06* 14.80* 15.50*  --   CALCIUM 9.7  --   --   --     MICRO No results found for this or any previous visit (from the past 240 hour(s)).    Lab Results  Component Value Date   PTH 420* 05/26/2015   CALCIUM 9.7 07/17/2015   CAION 1.08* 07/18/2015   PHOS 6.7* 07/02/2015      Impression: 1)Renal  ESRD on HD                Pt is not in regular  Schedule sec to her complaince/adherence issues                Pt last HD was on Monday                Will dialyze pt in am.   2)HTN Bp is notat goal  3)Anemia In ESRD the goal for HGb is 9--11. Pt HGb is  at goal Will keep on epo   4)CKD Mineral-Bone Disorder PTH acceptable. Secondary Hyperparathyroidism  Present. Phosphorus not at goal.  on binders  5)Psych .  Hx of   psycosis Schizophrenia Primary MD following  6)Electrolytes  Normokalemic     NOrmonatremic   7)Acid base Co2 not  at goal  Sec to no adherence to  HD  Plan:  Will dialyze in am  Will keep on epo Will use 2 k bath Will try to take 3 liters off      Modean Mccullum S 07/19/2015, 4:12 AM

## 2015-07-18 NOTE — ED Notes (Signed)
Pt from home with cc of sob, and "I need dialysis". Pt was last dialyzed Friday, 4/7. Pt has R Arm Fistula, +bruit +thrill. Pt has no outpatient dialysis center or schedule, comes to hospital when she needs HD. A&ox4, denies cp, sats 99% on room air.
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
## 2015-07-18 NOTE — Discharge Instructions (Signed)
Dialysis °Dialysis is a procedure that replaces some of the work healthy kidneys do. It is done when you lose about 85-90% of your kidney function. It may also be done earlier if your symptoms may be improved by dialysis. During dialysis, wastes, salt, and extra water are removed from the blood, and the levels of certain chemicals in the blood (such as potassium) are maintained. Dialysis is done in sessions. Dialysis sessions are continued until the kidneys get better. If the kidneys cannot get better, such as in end-stage kidney disease, dialysis is continued for life or until you receive a new kidney (kidney transplant). There are two types of dialysis: hemodialysis and peritoneal dialysis. °WHAT IS HEMODIALYSIS?  °Hemodialysis is a type of dialysis in which a machine called a dialyzer is used to filter the blood. Before beginning hemodialysis, you will have surgery to create a site where blood can be removed from the body and returned to the body (vascular access). There are three types of vascular accesses: °· Arteriovenous fistula. To create this type of access, an artery is connected to a vein (usually in the arm). A fistula takes 1-6 months to develop after surgery. If it develops properly, it usually lasts longer than the other types of vascular accesses. It is also less likely to become infected and cause blood clots. °· Arteriovenous graft. To create this type of access, an artery and a vein in the arm are connected with a tube. A graft may be used within 2-3 weeks of surgery. °· A venous catheter. To create this type of access, a thin, flexible tube (catheter) is placed in a large vein in your neck, chest, or groin. A catheter may be used right away. It is usually used as a temporary access when dialysis needs to begin immediately. °During hemodialysis, blood leaves the body through your access. It travels through a tube to the dialyzer, where it is filtered. The blood then returns to your body through  another tube. °Hemodialysis is usually performed by a health care provider at a hospital or dialysis center three times a week. Visits last about 3-4 hours. It may also be performed with the help of another person at home with training.  °WHAT IS PERITONEAL DIALYSIS? °Peritoneal dialysis is a type of dialysis in which the thin lining of the abdomen (peritoneum) is used as a filter. Before beginning peritoneal dialysis, you will have surgery to place a catheter in your abdomen. The catheter will be used to transfer a fluid called dialysate to and from your abdomen. At the start of a session, your abdomen is filled with dialysate. During the session, wastes, salt, and extra water in the blood pass through the peritoneum and into the dialysate. The dialysate is drained from the body at the end of the session. The process of filling and draining the dialysate is called an exchange. Exchanges are repeated until you have used up all the dialysate for the day. °Peritoneal dialysis may be performed by you at home or at almost any other location. It is done every day. You may need up to five exchanges a day. The amount of time the dialysate is in your body between exchanges is called a dwell. The dwell depends on the number of exchanges needed and the characteristics of the peritoneum. It usually varies from 1.5-3 hours. You may go about your day normally between exchanges. Alternately, the exchanges may be done at night while you sleep, using a machine called a cycler. °WHICH TYPE   OF DIALYSIS SHOULD I CHOOSE?  °Both hemodialysis and peritoneal dialysis have advantages and disadvantages. Talk to your health care provider about which type of dialysis would be best for you. Your lifestyle and preferences should be considered along with your medical condition. In some cases, only one type of dialysis may be an option.  °Advantages of hemodialysis °· It is done less often than peritoneal dialysis. °· Someone else can do the  dialysis for you. °· If you go to a dialysis center, your health care provider will be able to recognize any problems right away. °· If you go to a dialysis center, you can interact with others who are having dialysis. This can provide you with emotional support. °Disadvantages of hemodialysis °· Hemodialysis may cause cramps and low blood pressure. It may leave you feeling tired on the days you have the treatment. °· If you go to a dialysis center, you will need to make weekly appointments and work around the center's schedule. °· You will need to take extra care when traveling. If you go to a dialysis center, you will need to make special arrangements to visit a dialysis center near your destination. If you are having treatments at home, you will need to take the dialyzer with you to your destination. °· You will need to avoid more foods than you would need to avoid on peritoneal dialysis. °Advantages of peritoneal dialysis °· It is less likely than hemodialysis to cause cramps and low blood pressure. °· You may do exchanges on your own wherever you are, including when you travel. °· You do not need to avoid as many foods as you do on hemodialysis. °Disadvantages of peritoneal dialysis °· It is done more often than hemodialysis. °· Performing peritoneal dialysis requires you to have dexterity of the hands. You must also be able to lift bags. °· You will have to learn sterilization techniques. You will need to practice them every day to reduce the risk of infection.  °WHAT CHANGES WILL I NEED TO MAKE TO MY DIET DURING DIALYSIS? °Both hemodialysis and peritoneal dialysis require you to make some changes to your diet. For example, you will need to limit your intake of foods high in the minerals phosphorus and potassium. You will also need to limit your fluid intake. Your dietitian can help you plan meals. A good meal plan can improve your dialysis and your health.  °WHAT SHOULD I EXPECT WHEN BEGINNING  DIALYSIS? °Adjusting to the dialysis treatment, schedule, and diet can take some time. You may need to stop working and may not be able to do some of the things you normally do. You may feel anxious or depressed when beginning dialysis. Eventually, many people feel better overall because of dialysis. Some people are able to return to work after making some changes, such as reducing work intensity. °WHERE CAN I FIND MORE INFORMATION?  °· National Kidney Foundation: www.kidney.org °· American Association of Kidney Patients: www.aakp.org °· American Kidney Fund: www.kidneyfund.org °  °This information is not intended to replace advice given to you by your health care provider. Make sure you discuss any questions you have with your health care provider. °  °Document Released: 06/14/2002 Document Revised: 04/14/2014 Document Reviewed: 05/18/2012 °Elsevier Interactive Patient Education ©2016 [employer] ° °

## 2015-07-18 NOTE — ED Notes (Signed)
Pt reports last dialysis treatment was Friday.  Pt says didn't get to come Monday because she was in court.  [facility] says [facility] and was denied dialysis so she came here.

## 2015-07-19 ENCOUNTER — Encounter (HOSPITAL_COMMUNITY): Payer: Self-pay | Admitting: Family Medicine

## 2015-07-19 DIAGNOSIS — E875 Hyperkalemia: Secondary | ICD-10-CM

## 2015-07-19 DIAGNOSIS — I150 Renovascular hypertension: Secondary | ICD-10-CM | POA: Diagnosis not present

## 2015-07-19 DIAGNOSIS — N186 End stage renal disease: Secondary | ICD-10-CM | POA: Diagnosis not present

## 2015-07-19 DIAGNOSIS — M329 Systemic lupus erythematosus, unspecified: Secondary | ICD-10-CM | POA: Diagnosis not present

## 2015-07-19 DIAGNOSIS — Z992 Dependence on renal dialysis: Secondary | ICD-10-CM | POA: Diagnosis not present

## 2015-07-19 DIAGNOSIS — I132 Hypertensive heart and chronic kidney disease with heart failure and with stage 5 chronic kidney disease, or end stage renal disease: Secondary | ICD-10-CM | POA: Diagnosis not present

## 2015-07-19 LAB — CBC
HCT: 26.4 % — ABNORMAL LOW (ref 36.0–46.0)
Hemoglobin: 9 g/dL — ABNORMAL LOW (ref 12.0–15.0)
MCH: 29.1 pg (ref 26.0–34.0)
MCHC: 34.1 g/dL (ref 30.0–36.0)
MCV: 85.4 fL (ref 78.0–100.0)
Platelets: 242 10*3/uL (ref 150–400)
RBC: 3.09 MIL/uL — ABNORMAL LOW (ref 3.87–5.11)
RDW: 17.3 % — ABNORMAL HIGH (ref 11.5–15.5)
WBC: 6.1 10*3/uL (ref 4.0–10.5)

## 2015-07-19 LAB — POTASSIUM
POTASSIUM: 5 mmol/L (ref 3.5–5.1)
Potassium: 5.1 mmol/L (ref 3.5–5.1)

## 2015-07-19 MED ORDER — ACETAMINOPHEN 325 MG PO TABS
ORAL_TABLET | ORAL | Status: AC
  Administered 2015-07-19: 650 mg
  Filled 2015-07-19: qty 2

## 2015-07-19 MED ORDER — PREDNISONE 20 MG PO TABS
20.0000 mg | ORAL_TABLET | Freq: Every day | ORAL | Status: DC
  Administered 2015-07-19: 20 mg via ORAL
  Filled 2015-07-19: qty 1

## 2015-07-19 MED ORDER — HEPARIN SODIUM (PORCINE) 1000 UNIT/ML IJ SOLN
INTRAMUSCULAR | Status: AC
  Administered 2015-07-19: 1000 [IU] via INTRAVENOUS_CENTRAL
  Filled 2015-07-19: qty 6

## 2015-07-19 MED ORDER — EPOETIN ALFA 2000 UNIT/ML IJ SOLN
2000.0000 [IU] | Freq: Once | INTRAMUSCULAR | Status: DC
Start: 2015-07-19 — End: 2015-07-19
  Filled 2015-07-19: qty 1

## 2015-07-19 MED ORDER — ONDANSETRON HCL 4 MG/2ML IJ SOLN: 4.0000 mg | Freq: Four times a day (QID) | INTRAMUSCULAR | Status: DC | PRN

## 2015-07-19 MED ORDER — AMLODIPINE BESYLATE 5 MG PO TABS: 10.0000 mg | ORAL_TABLET | Freq: Every day | ORAL | Status: DC

## 2015-07-19 MED ORDER — DIPHENHYDRAMINE HCL 25 MG PO CAPS
25.0000 mg | ORAL_CAPSULE | Freq: Four times a day (QID) | ORAL | Status: DC | PRN
  Administered 2015-07-19: 25 mg via ORAL
  Filled 2015-07-19: qty 1

## 2015-07-19 MED ORDER — PENTAFLUOROPROP-TETRAFLUOROETH EX AERO: 1.0000 "application " | INHALATION_SPRAY | CUTANEOUS | Status: DC | PRN

## 2015-07-19 MED ORDER — LIDOCAINE HCL (PF) 1 % IJ SOLN
INTRAMUSCULAR | Status: AC
  Administered 2015-07-19: 0.5 mL via INTRADERMAL
  Filled 2015-07-19: qty 5

## 2015-07-19 MED ORDER — SODIUM CHLORIDE 0.9% FLUSH
3.0000 mL | Freq: Two times a day (BID) | INTRAVENOUS | Status: DC
  Administered 2015-07-19: 3 mL via INTRAVENOUS

## 2015-07-19 MED ORDER — SODIUM CHLORIDE 0.9 % IV SOLN: 100.0000 mL | INTRAVENOUS | Status: DC | PRN

## 2015-07-19 MED ORDER — LIDOCAINE HCL (PF) 1 % IJ SOLN
5.0000 mL | INTRAMUSCULAR | Status: DC | PRN
  Administered 2015-07-19: 0.5 mL via INTRADERMAL

## 2015-07-19 MED ORDER — AMLODIPINE BESYLATE 5 MG PO TABS
10.0000 mg | ORAL_TABLET | Freq: Once | ORAL | Status: AC
  Administered 2015-07-19: 10 mg via ORAL
  Filled 2015-07-19: qty 2

## 2015-07-19 MED ORDER — ACETAMINOPHEN 500 MG PO TABS: 1000.0000 mg | ORAL_TABLET | Freq: Four times a day (QID) | ORAL | Status: DC | PRN

## 2015-07-19 MED ORDER — EPOETIN ALFA 4000 UNIT/ML IJ SOLN
INTRAMUSCULAR | Status: AC
  Administered 2015-07-19: 2000 [IU]
  Filled 2015-07-19: qty 1

## 2015-07-19 MED ORDER — SODIUM CHLORIDE 0.9 % IV SOLN: 250.0000 mL | INTRAVENOUS | Status: DC | PRN

## 2015-07-19 MED ORDER — METOPROLOL TARTRATE 50 MG PO TABS
50.0000 mg | ORAL_TABLET | Freq: Two times a day (BID) | ORAL | Status: DC
  Administered 2015-07-19 (×2): 50 mg via ORAL
  Filled 2015-07-19 (×2): qty 1

## 2015-07-19 MED ORDER — POLYETHYLENE GLYCOL 3350 17 G PO PACK: 17.0000 g | PACK | Freq: Every day | ORAL | Status: DC | PRN

## 2015-07-19 MED ORDER — LORATADINE 10 MG PO TABS
10.0000 mg | ORAL_TABLET | Freq: Every day | ORAL | Status: DC
  Administered 2015-07-19: 10 mg via ORAL
  Filled 2015-07-19: qty 1

## 2015-07-19 MED ORDER — HEPARIN SODIUM (PORCINE) 1000 UNIT/ML DIALYSIS
1000.0000 [IU] | INTRAMUSCULAR | Status: DC | PRN
  Administered 2015-07-19: 1000 [IU] via INTRAVENOUS_CENTRAL

## 2015-07-19 MED ORDER — SODIUM CHLORIDE 0.9% FLUSH: 3.0000 mL | INTRAVENOUS | Status: DC | PRN

## 2015-07-19 MED ORDER — LIDOCAINE-PRILOCAINE 2.5-2.5 % EX CREA: 1.0000 "application " | TOPICAL_CREAM | CUTANEOUS | Status: DC | PRN

## 2015-07-19 MED ORDER — SODIUM CHLORIDE 0.9 % IV SOLN
100.0000 mL | INTRAVENOUS | Status: DC | PRN
Start: 2015-07-19 — End: 2015-07-19

## 2015-07-19 MED ORDER — SODIUM POLYSTYRENE SULFONATE 15 GM/60ML PO SUSP
30.0000 g | Freq: Once | ORAL | Status: AC
  Administered 2015-07-19: 30 g via ORAL
  Filled 2015-07-19: qty 120

## 2015-07-19 MED ORDER — ONDANSETRON HCL 4 MG PO TABS: 4.0000 mg | ORAL_TABLET | Freq: Four times a day (QID) | ORAL | Status: DC | PRN

## 2015-07-19 NOTE — Progress Notes (Signed)
Returned to room post dialysis, 4L removed, OK to administer blood pressure medication held from this AM per dialysis nurse.  Reports patient will stay and receive dialysis to get back on schedule.

## 2015-07-19 NOTE — Progress Notes (Signed)
[name]  Sara [name] [name] DOB: 06/26/1980 DOA: 07/18/2015 PCP: No PCP Per Patient  Assessment/Plan:  1. Hyperkalemia  - Received Kayexalate in the ED - HD today   2. ESRD - Attributed to lupus nephritis  - Dependent on local EDs for HD since her discharge from outpt center d/t non-adherence to the treatment plan and behavioral problems  - Last dialyzed 07/13/15 and reportedly completed the session without incident at that time  - Serum potassium is mildly elevated; she reports mild dyspnea but saturating well on rm air without apparent distress, BP is elevated to the 170/110 range  - HD today   3. HTN  - Elevated to 160/110 on admission  - Continue with  Lopressor BID  -received one time dose of Norvasc.  - Tentative plans for HD today  4. Lupus  -Continue with prednisone 20 mg daily.  Code Status: full code.  Family Communication:none at bedside.  Disposition Plan: home in 24 hours   Consultants:  Nephrology   Procedures:  HD  Antibiotics:  none  HPI/Subjective: She is sleepy, wake up answer some questions, goes back to sleep.  denies chest pain   Objective: Filed Vitals:   07/19/15 0158 07/19/15 0500  BP: 174/121 153/109  Pulse: 102 101  Temp: 98.9 F (37.2 C) 97.8 F (36.6 C)  Resp: 20 20    Intake/Output Summary (Last 24 hours) at 07/19/15 1042 Last data filed at 07/19/15 0830  Gross per 24 hour  Intake    240 ml  Output      0 ml  Net    240 ml   Filed Weights   07/18/15 1653 07/19/15 0158 07/19/15 0500  Weight: 63.504 kg (140 lb) 89.7 kg (197 lb 12 oz) 89.585 kg (197 lb 8 oz)    Exam:   General:  Sleepy,   Cardiovascular: S 1, S 2 RRR  Respiratory: CTA  Abdomen: Bs present, soft, nt  Musculoskeletal: trace edema  Data Reviewed: Basic Metabolic Panel:  Recent Labs Lab 07/13/15 1030 07/17/15 1754 07/18/15 0957 07/18/15 2156 07/19/15 0250 07/19/15 0636  NA 139 137 135 133*   --   --   K 4.6 4.7 4.9 5.2* 5.1 5.0  CL 97* 97* 99* 99*  --   --   CO2 25 20*  --   --   --   --   GLUCOSE 100* 101* 82 119*  --   --   BUN 74* 93* 93* 99*  --   --   CREATININE 8.93* 13.06* 14.80* 15.50*  --   --   CALCIUM 9.0 9.7  --   --   --   --    Liver Function Tests: No results for input(s): AST, ALT, ALKPHOS, BILITOT, PROT, ALBUMIN in the last 168 hours. No results for input(s): LIPASE, AMYLASE in the last 168 hours. No results for input(s): AMMONIA in the last 168 hours. CBC:  Recent Labs Lab 07/13/15 1030 07/17/15 1754 07/18/15 0957 07/18/15 2156  WBC 6.5 6.6  --   --   HGB 8.5* 8.5* 10.2* 10.5*  HCT 25.7* 25.9* 30.0* 31.0*  MCV 88.3 86.9  --   --   PLT 197 208  --   --    Cardiac Enzymes: No results for input(s): CKTOTAL, CKMB, CKMBINDEX, TROPONINI in the last 168 hours. BNP (last 3 results)  Recent Labs  09/11/14 0815  BNP 1694.9*    ProBNP (last 3 results) No results for input(s):  PROBNP in the last 8760 hours.  CBG: No results for input(s): GLUCAP in the last 168 hours.  No results found for this or any previous visit (from the past 240 hour(s)).   Studies: No results found.  Scheduled Meds: . epoetin (EPOGEN/PROCRIT) injection  2,000 Units Subcutaneous Once  . loratadine  10 mg Oral Daily  . metoprolol  50 mg Oral BID  . predniSONE  20 mg Oral Q breakfast  . sodium chloride flush  3 mL Intravenous Q12H  . sodium chloride flush  3 mL Intravenous Q12H   Continuous Infusions:   Principal Problem:   Encounter for hemodialysis for end-stage renal disease ([place]) Active Problems:   HTN (hypertension)   Hyperkalemia   Lupus (systemic lupus erythematosus) ([place])    Time spent: 25 minutes.     Niel Hummer A  [facility] Pager [phone]. If 7PM-7AM, please contact night-coverage at [email or web address], password [facility] 07/19/2015, 10:42 AM

## 2015-07-19 NOTE — Progress Notes (Signed)
Subjective: Interval History: has no complaint of nausea or vomiting. Patient has some difficulty breathing..  Objective: Vital signs in last 24 hours: Temp:  [97.8 F (36.6 C)-99.1 F (37.3 C)] 97.8 F (36.6 C) (04/13 0500) Pulse Rate:  [99-116] 101 (04/13 0500) Resp:  [18-20] 20 (04/13 0500) BP: (153-179)/(106-124) 153/109 mmHg (04/13 0500) SpO2:  [96 %-100 %] 96 % (04/13 0500) Weight:  [63.504 kg (140 lb)-89.7 kg (197 lb 12 oz)] 89.585 kg (197 lb 8 oz) (04/13 0500) Weight change:   Intake/Output from previous day:   Intake/Output this shift:    General appearance: no distress and Sleepy but arousable. Resp: clear to auscultation bilaterally Cardio: regular rate and rhythm, S1, S2 normal, no murmur, click, rub or gallop GI: soft, non-tender; bowel sounds normal; no masses,  no organomegaly Extremities: edema She has 2+ edema bilaterally  Lab Results:  Recent Labs  07/17/15 1754 07/18/15 0957 07/18/15 2156  WBC 6.6  --   --   HGB 8.5* 10.2* 10.5*  HCT 25.9* 30.0* 31.0*  PLT 208  --   --    BMET:  Recent Labs  07/17/15 1754 07/18/15 0957 07/18/15 2156 07/19/15 0250 07/19/15 0636  NA 137 135 133*  --   --   K 4.7 4.9 5.2* 5.1 5.0  CL 97* 99* 99*  --   --   CO2 20*  --   --   --   --   GLUCOSE 101* 82 119*  --   --   BUN 93* 93* 99*  --   --   CREATININE 13.06* 14.80* 15.50*  --   --   CALCIUM 9.7  --   --   --   --    No results for input(s): PTH in the last 72 hours. Iron Studies: No results for input(s): IRON, TIBC, TRANSFERRIN, FERRITIN in the last 72 hours.  Studies/Results: No results found.  I have reviewed the patient's current medications.  Assessment/Plan: Problem #1 end-stage renal disease: She is status post hemodialysis about 5 days ago. Patient comes intermittently for dialysis. She has [facility] she did want to stay in emergency room hence she decided to come here. Patient denies any nausea or vomiting. Problem #2 fluid  overload. Presently she has significant sign of fluid overload. Patient with some eyelid puffiness. Problem #3 anemia: Her hemoglobin and hematocrit is within our target goal Problem #4 hypertension: Her blood pressure is slightly high but overall controlled Problem #5 history of lupus Problem #6 history of bipolar disorder and aggressive behavior Plan: We'll dialyze patient for 4 hours and have today 2] were removed about 4 L if her blood pressure tolerates.      Ondria Oswald S 07/19/2015,8:26 AM

## 2015-07-19 NOTE — Care Management Obs Status (Signed)
[facility] [place] NOTIFICATION   Patient Details  Name: Sara [name] MRN: [id number] Date of Birth: 1981/02/23   Medicare Observation Status Notification Given:  Yes    Alvie Heidelberg, RN 07/19/2015, 11:35 AM

## 2015-07-19 NOTE — Procedures (Signed)
   HEMODIALYSIS TREATMENT NOTE:  4.5 hour low-heparin dialysis completed via right upper arm AVF (15g ante/retrograde). Goal met: 4 liters removed without interruption in ultrafiltration. All blood returned and hemostasis achieved within 10 minutes. Report given to Hampton Abbot, RN.  Rockwell Alexandria, RN CDN

## 2015-07-19 NOTE — Progress Notes (Signed)
Requested AMA papers, removed her telemetry, central telemetry called and notified, IV access removed and patient left ambulatory unassisted by staff stating her ride was out front, that she would return in the AM for dialysis.
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
## 2015-07-19 NOTE — Progress Notes (Signed)
Down via bed to dialysis, requested Benadryl for generalized itching and pain med for low back pain rated 8/10.  Levada Dy, Dialysis nurse, will administer Benadryl and Tylenol.  SCD's in place and [employer] notified that patient would be on 2nd floor in dialysis.

## 2015-07-20 ENCOUNTER — Emergency Department (HOSPITAL_COMMUNITY)
Admission: EM | Admit: 2015-07-20 | Discharge: 2015-07-20 | Disposition: A | Discharge status: Home / Self Care | Payer: Medicare Other | Attending: Emergency Medicine | Admitting: Emergency Medicine

## 2015-07-20 ENCOUNTER — Encounter (HOSPITAL_COMMUNITY): Payer: Self-pay | Admitting: Emergency Medicine

## 2015-07-20 DIAGNOSIS — N186 End stage renal disease: Secondary | ICD-10-CM | POA: Diagnosis not present

## 2015-07-20 DIAGNOSIS — D631 Anemia in chronic kidney disease: Secondary | ICD-10-CM | POA: Diagnosis not present

## 2015-07-20 DIAGNOSIS — F17210 Nicotine dependence, cigarettes, uncomplicated: Secondary | ICD-10-CM | POA: Insufficient documentation

## 2015-07-20 DIAGNOSIS — F250 Schizoaffective disorder, bipolar type: Secondary | ICD-10-CM | POA: Insufficient documentation

## 2015-07-20 DIAGNOSIS — I132 Hypertensive heart and chronic kidney disease with heart failure and with stage 5 chronic kidney disease, or end stage renal disease: Secondary | ICD-10-CM | POA: Insufficient documentation

## 2015-07-20 DIAGNOSIS — Z992 Dependence on renal dialysis: Secondary | ICD-10-CM | POA: Diagnosis not present

## 2015-07-20 DIAGNOSIS — I120 Hypertensive chronic kidney disease with stage 5 chronic kidney disease or end stage renal disease: Secondary | ICD-10-CM | POA: Diagnosis not present

## 2015-07-20 DIAGNOSIS — I509 Heart failure, unspecified: Secondary | ICD-10-CM | POA: Insufficient documentation

## 2015-07-20 LAB — I-STAT CHEM 8, ED
BUN: 63 mg/dL — ABNORMAL HIGH (ref 6–20)
CALCIUM ION: 1.11 mmol/L — AB (ref 1.12–1.23)
Chloride: 99 mmol/L — ABNORMAL LOW (ref 101–111)
Creatinine, Ser: 10.9 mg/dL — ABNORMAL HIGH (ref 0.44–1.00)
GLUCOSE: 121 mg/dL — AB (ref 65–99)
HCT: 33 % — ABNORMAL LOW (ref 36.0–46.0)
HEMOGLOBIN: 11.2 g/dL — AB (ref 12.0–15.0)
Potassium: 4.2 mmol/L (ref 3.5–5.1)
Sodium: 140 mmol/L (ref 135–145)
TCO2: 25 mmol/L (ref 0–100)

## 2015-07-20 LAB — HEPATITIS B SURFACE ANTIGEN: HEP B S AG: NEGATIVE
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
## 2015-07-20 NOTE — ED Notes (Signed)
Patient verbalizes understanding of discharge instructions, home care and follow up care. Patient out of department at this time. 

## 2015-07-20 NOTE — ED Notes (Signed)
Pt requesting dialysis. Pt last dialyzed yesterday.

## 2015-07-20 NOTE — Discharge Instructions (Signed)
You do not need dialysis tonight.

## 2015-07-23 ENCOUNTER — Observation Stay (HOSPITAL_COMMUNITY)
Admission: EM | Admit: 2015-07-23 | Discharge: 2015-07-23 | Disposition: A | Discharge status: Home / Self Care | Payer: Medicare Other | Attending: Family Medicine | Admitting: Family Medicine

## 2015-07-23 ENCOUNTER — Encounter (HOSPITAL_COMMUNITY): Payer: Self-pay | Admitting: Emergency Medicine

## 2015-07-23 DIAGNOSIS — I509 Heart failure, unspecified: Secondary | ICD-10-CM | POA: Diagnosis not present

## 2015-07-23 DIAGNOSIS — I120 Hypertensive chronic kidney disease with stage 5 chronic kidney disease or end stage renal disease: Secondary | ICD-10-CM | POA: Diagnosis not present

## 2015-07-23 DIAGNOSIS — N186 End stage renal disease: Secondary | ICD-10-CM | POA: Diagnosis not present

## 2015-07-23 DIAGNOSIS — M7989 Other specified soft tissue disorders: Secondary | ICD-10-CM | POA: Diagnosis not present

## 2015-07-23 DIAGNOSIS — F319 Bipolar disorder, unspecified: Secondary | ICD-10-CM | POA: Diagnosis not present

## 2015-07-23 DIAGNOSIS — Z792 Long term (current) use of antibiotics: Secondary | ICD-10-CM | POA: Insufficient documentation

## 2015-07-23 DIAGNOSIS — Z79899 Other long term (current) drug therapy: Secondary | ICD-10-CM | POA: Insufficient documentation

## 2015-07-23 DIAGNOSIS — Z992 Dependence on renal dialysis: Secondary | ICD-10-CM | POA: Insufficient documentation

## 2015-07-23 DIAGNOSIS — Z4931 Encounter for adequacy testing for hemodialysis: Secondary | ICD-10-CM | POA: Diagnosis present

## 2015-07-23 DIAGNOSIS — E8770 Fluid overload, unspecified: Secondary | ICD-10-CM | POA: Diagnosis not present

## 2015-07-23 DIAGNOSIS — I132 Hypertensive heart and chronic kidney disease with heart failure and with stage 5 chronic kidney disease, or end stage renal disease: Secondary | ICD-10-CM | POA: Diagnosis not present

## 2015-07-23 DIAGNOSIS — I10 Essential (primary) hypertension: Secondary | ICD-10-CM | POA: Diagnosis not present

## 2015-07-23 DIAGNOSIS — F17210 Nicotine dependence, cigarettes, uncomplicated: Secondary | ICD-10-CM | POA: Diagnosis not present

## 2015-07-23 DIAGNOSIS — D631 Anemia in chronic kidney disease: Secondary | ICD-10-CM | POA: Diagnosis not present

## 2015-07-23 LAB — RENAL FUNCTION PANEL
Albumin: 3.2 g/dL — ABNORMAL LOW (ref 3.5–5.0)
Anion gap: 17 — ABNORMAL HIGH (ref 5–15)
BUN: 153 mg/dL — ABNORMAL HIGH (ref 6–20)
CO2: 20 mmol/L — ABNORMAL LOW (ref 22–32)
Calcium: 8.9 mg/dL (ref 8.9–10.3)
Chloride: 100 mmol/L — ABNORMAL LOW (ref 101–111)
Creatinine, Ser: 14.17 mg/dL — ABNORMAL HIGH (ref 0.44–1.00)
GFR calc Af Amer: 3 mL/min — ABNORMAL LOW (ref >60)
GFR calc non Af Amer: 3 mL/min — ABNORMAL LOW (ref >60)
Glucose, Bld: 126 mg/dL — ABNORMAL HIGH (ref 65–99)
Phosphorus: 11.5 mg/dL — ABNORMAL HIGH (ref 2.5–4.6)
Potassium: 4.1 mmol/L (ref 3.5–5.1)
Sodium: 137 mmol/L (ref 135–145)

## 2015-07-23 LAB — CBC
HCT: 24.8 % — ABNORMAL LOW (ref 36.0–46.0)
Hemoglobin: 8.2 g/dL — ABNORMAL LOW (ref 12.0–15.0)
MCH: 29 pg (ref 26.0–34.0)
MCHC: 33.1 g/dL (ref 30.0–36.0)
MCV: 87.6 fL (ref 78.0–100.0)
Platelets: 229 10*3/uL (ref 150–400)
RBC: 2.83 MIL/uL — ABNORMAL LOW (ref 3.87–5.11)
RDW: 17.4 % — ABNORMAL HIGH (ref 11.5–15.5)
WBC: 7.2 10*3/uL (ref 4.0–10.5)

## 2015-07-23 MED ORDER — TRAMADOL HCL 50 MG PO TABS
50.0000 mg | ORAL_TABLET | Freq: Two times a day (BID) | ORAL | Status: DC | PRN
  Administered 2015-07-23: 50 mg via ORAL

## 2015-07-23 MED ORDER — PENTAFLUOROPROP-TETRAFLUOROETH EX AERO: 1.0000 "application " | INHALATION_SPRAY | CUTANEOUS | Status: DC | PRN

## 2015-07-23 MED ORDER — ACETAMINOPHEN 325 MG PO TABS: 650.0000 mg | ORAL_TABLET | Freq: Four times a day (QID) | ORAL | Status: DC | PRN

## 2015-07-23 MED ORDER — METOPROLOL TARTRATE 50 MG PO TABS: 50.0000 mg | ORAL_TABLET | Freq: Two times a day (BID) | ORAL | Status: DC

## 2015-07-23 MED ORDER — HEPARIN SODIUM (PORCINE) 1000 UNIT/ML DIALYSIS
20.0000 [IU]/kg | INTRAMUSCULAR | Status: DC | PRN
  Administered 2015-07-23: 1300 [IU] via INTRAVENOUS_CENTRAL
  Filled 2015-07-23 (×2): qty 2

## 2015-07-23 MED ORDER — TRAMADOL HCL 50 MG PO TABS
ORAL_TABLET | ORAL | Status: AC
  Administered 2015-07-23: 50 mg via ORAL
  Filled 2015-07-23: qty 1

## 2015-07-23 MED ORDER — DIPHENHYDRAMINE HCL 25 MG PO CAPS
ORAL_CAPSULE | ORAL | Status: AC
  Administered 2015-07-23: 25 mg via ORAL
  Filled 2015-07-23: qty 1

## 2015-07-23 MED ORDER — FUROSEMIDE 80 MG PO TABS: 120.0000 mg | ORAL_TABLET | Freq: Every day | ORAL | Status: DC

## 2015-07-23 MED ORDER — EPOETIN ALFA 4000 UNIT/ML IJ SOLN
4000.0000 [IU] | Freq: Once | INTRAMUSCULAR | Status: AC
  Administered 2015-07-23: 4000 [IU] via INTRAVENOUS

## 2015-07-23 MED ORDER — EPOETIN ALFA 4000 UNIT/ML IJ SOLN
INTRAMUSCULAR | Status: AC
  Administered 2015-07-23: 4000 [IU] via INTRAVENOUS
  Filled 2015-07-23: qty 1

## 2015-07-23 MED ORDER — HEPARIN SODIUM (PORCINE) 1000 UNIT/ML IJ SOLN
INTRAMUSCULAR | Status: AC
  Administered 2015-07-23: 1300 [IU] via INTRAVENOUS_CENTRAL
  Filled 2015-07-23: qty 5

## 2015-07-23 MED ORDER — SODIUM CHLORIDE 0.9 % IV SOLN: 100.0000 mL | INTRAVENOUS | Status: DC | PRN

## 2015-07-23 MED ORDER — LIDOCAINE HCL (PF) 1 % IJ SOLN
INTRAMUSCULAR | Status: AC
  Administered 2015-07-23: 0.5 mL via INTRADERMAL
  Filled 2015-07-23: qty 5

## 2015-07-23 MED ORDER — ONDANSETRON HCL 4 MG/2ML IJ SOLN: 4.0000 mg | Freq: Three times a day (TID) | INTRAMUSCULAR | Status: DC | PRN

## 2015-07-23 MED ORDER — LIDOCAINE-PRILOCAINE 2.5-2.5 % EX CREA: 1.0000 "application " | TOPICAL_CREAM | CUTANEOUS | Status: DC | PRN

## 2015-07-23 MED ORDER — PREDNISONE 20 MG PO TABS: 20.0000 mg | ORAL_TABLET | Freq: Every day | ORAL | Status: DC

## 2015-07-23 MED ORDER — LIDOCAINE HCL (PF) 1 % IJ SOLN
5.0000 mL | INTRAMUSCULAR | Status: DC | PRN
  Administered 2015-07-23: 0.5 mL via INTRADERMAL

## 2015-07-23 MED ORDER — LORATADINE 10 MG PO TABS: 10.0000 mg | ORAL_TABLET | Freq: Every day | ORAL | Status: DC

## 2015-07-23 MED ORDER — DIPHENHYDRAMINE HCL 25 MG PO CAPS
25.0000 mg | ORAL_CAPSULE | Freq: Four times a day (QID) | ORAL | Status: DC | PRN
  Administered 2015-07-23: 25 mg via ORAL

## 2015-07-23 NOTE — Consult Note (Signed)
Sara [name] MRN: [id number] DOB/AGE: 34-Nov-1982 34 y.o. Primary Care Physician:No PCP Per Patient Admit date: 07/23/2015 Chief Complaint:  Chief Complaint  Patient presents with  . Dialysis    HPI: Pt is 35 year old female with past medical hx of ESRD who was admitted with c/o dyspnea," I need dialysis "  HPI dates back to few days ago started feeling short of breath,and so pt came to ER asking for her dialysis. Pt says "I  had my dialysis 4 days ago, I am still over my dry weight, I need my dialysis ". NO c/o chest pain No c/o fever/cough/chills NO c/o nausea/vomiting/ diarrhea  no c/o hematuria Pt was seen  for need of renal replacement therapy.  Past Medical History  Diagnosis Date  . Lupus ([place])     lupus w nephritis  . History of blood transfusion     "this is probably my 3rd" (10/09/2014)  . ESRD (end stage renal disease) on dialysis [facility])     "[name]; [facility]" (10/09/2014)  . Bipolar disorder ([place])     [name] 10/09/2014  . Schizophrenia ([place])     [name] 10/09/2014  . Chronic anemia     [name] 10/09/2014  . CHF (congestive heart failure) ([place])     systolic/notes [id number]  . Acute myopericarditis     hx/notes 10/09/2014  . Psychosis   . Hypertension   . Pregnancy   . Low back pain   . Positive ANA (antinuclear antibody) 08/16/2012  . 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 Tobacco abuse 02/20/2014  . Schizoaffective disorder, bipolar type ([place]) 11/20/2014  . Non-compliant patient         Family History  Problem Relation Age of Onset  . Drug abuse Father   . Kidney disease Father     Social History:  reports that she has been smoking Cigarettes.  She has a 2 pack-year smoking history. She uses smokeless tobacco. She reports that she drinks about 4.2 oz of alcohol per week. She reports that she uses illicit drugs (Marijuana and Cocaine).   Allergies:  Allergies  Allergen Reactions  . Ativan [Lorazepam] Swelling and Other (See  Comments)    Dysarthria(patient has difficulty speaking and slurred speech); denies swelling, itching, pain, or numbness.  [name] [Ziprasidone Hcl] Itching and Swelling    Tongue swelling  . Keflex [Cephalexin] Swelling and Other (See Comments)    Tongue swelling. Can't talk   . Haldol [Haloperidol Lactate] Swelling    Tongue swelling. 05/31/15 - MD ok with giving as pt has tolerated in the past Pt can take benadryl.  . Other Itching    wool    Medications Prior to Admission  Medication Sig Dispense Refill  . acetaminophen (TYLENOL) 500 MG tablet Take 1,000 mg by mouth every 6 (six) hours as needed for moderate pain. Reported on 06/12/2015    . clindamycin (CLINDAGEL) 1 % gel Apply topically 2 (two) times daily. 30 g 0  . furosemide (LASIX) 40 MG tablet Take 3 tablets (120 mg total) by mouth daily. 90 tablet 0  . loratadine (CLARITIN) 10 MG tablet Take 1 tablet (10 mg total) by mouth daily. Take daily. 20 tablet 0  . metoprolol (LOPRESSOR) 50 MG tablet Take 1 tablet (50 mg total) by mouth 2 (two) times daily. 60 tablet 0  . predniSONE (DELTASONE) 20 MG tablet Take 1 tablet (20 mg total) by mouth daily with breakfast.  [id number] from the symptoms mentioned above,there are no other symptoms referable to all systems reviewed.      Physical Exam: Vital signs in last 24 hours: Temp:  [97.7 F (36.5 C)-98.1 F (36.7 C)] 98.1 F (36.7 C) (04/17 1215) Pulse Rate:  [102-113] 104 (04/17 1500) Resp:  [16-18] 16 (04/17 1215) BP: (160-188)/(84-124) 179/96 mmHg (04/17 1500) SpO2:  [97 %-100 %] 100 % (04/17 1215) Weight:  [140 lb (63.504 kg)] 140 lb (63.504 kg) (04/17 0905) Weight change:     Intake/Output from previous day:       Physical Exam: General- pt is awake,alert, follows coomands Resp- No acute REsp distress,  decreased bs at bases. CVS- S1S2 regular in rate and rhythm, NO rubs  GIT- BS+, soft, NT, ND EXT- 2+ LE Edema, NO Cyanosis CNS- CN 2-12 grossly intact.  Moving all 4 extremities Access- AVF+, aneurysm present  Addendum  Two needles in situ Lab Results:  CBC    Component Value Date/Time   WBC 7.2 07/23/2015 1330   RBC 2.83* 07/23/2015 1330   RBC 2.77* 06/21/2015 1022   HGB 8.2* 07/23/2015 1330   HCT 24.8* 07/23/2015 1330   PLT 229 07/23/2015 1330   MCV 87.6 07/23/2015 1330   MCH 29.0 07/23/2015 1330   MCHC 33.1 07/23/2015 1330   RDW 17.4* 07/23/2015 1330   LYMPHSABS 1.3 07/08/2015 1502   MONOABS 0.6 07/08/2015 1502   EOSABS 0.1 07/08/2015 1502   BASOSABS 0.0 07/08/2015 1502      BMET  Recent Labs  07/20/15 1837 07/23/15 1330  NA 140 137  K 4.2 4.1  CL 99* 100*  CO2  --  20*  GLUCOSE 121* 126*  BUN 63* 153*  CREATININE 10.90* 14.17*  CALCIUM  --  8.9    MICRO No results found for this or any previous visit (from the past 240 hour([name])).    Lab Results  Component Value Date   PTH 420* 05/26/2015   CALCIUM 8.9 07/23/2015   CAION 1.11* 07/20/2015   PHOS 11.5* 07/23/2015      Impression: 1)Renal  ESRD on HD                Pt is not in regular  Schedule sec to her complaince/adherence issues                Pt last HD was on Wednesday                Will dialyze pt today  2)HTN Bp is notat goal  3)Anemia In ESRD the goal for HGb is 9--11. Pt HGb is NOt  at goal Will keep  on epo   4)CKD Mineral-Bone Disorder PTH acceptable. Secondary Hyperparathyroidism  Present. Phosphorus not at goal.  on binders  5)Psych .  Hx of   psycosis Schizophrenia Primary MD following  6)Electrolytes  Normokalemic     NOrmonatremic   7)Acid base Co2 not  at goal  Sec to no adherence to HD   Plan:  Will dialyze today Will keep  on epo Will use 2 k bath Will try to take 3 liters off  Addendum  Pt seen on Hd Pt tolerating tx well.    Sara [name] 07/23/2015, 3:12 PM

## 2015-07-23 NOTE — ED Notes (Signed)
Need HD today.  Last HD was Thursday.  Got off schedule last week due to labs.

## 2015-07-23 NOTE — ED Notes (Signed)
Pt given breakfast meal tray and ice chips per request.
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
## 2015-07-23 NOTE — Progress Notes (Signed)
Getting off the elevator I passed the patient getting on the elevator.  I voiced to her that I needed to call MD and/or she needed to sign AMA papers.  She stated that her ride was here and she could not wait.  So she left.  I notified night coverage by text.

## 2015-07-23 NOTE — Procedures (Signed)
   HEMODIALYSIS TREATMENT NOTE:  4.25 hour low-heparin dialysis completed via right upper arm AVF (15g ante/retrograde). Goal met: 3.3 liters removed without interruption in ultrafiltration. All blood returned and hemostasis achieved within 15 minutes. Report given to Sharen Hones, RN.  Rockwell Alexandria, RN, CDN

## 2015-07-23 NOTE — ED Notes (Signed)
Hospitalist at bedside 
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
## 2015-07-24 NOTE — Discharge Summary (Addendum)
Physician Discharge Summary  Sara [name] [id number] DOB: February 16, 1981 DOA: 07/23/2015  PCP: No PCP Per Patient  Admit date: 07/23/2015 Discharge date: 07/24/2015  Patient left AGAINST MEDICAL ADVICE.  Discharge Diagnoses:  1. End-stage renal disease with volume overload secondary to noncompliance  Discharge Condition: stable Disposition: left AMA   Filed Weights   07/23/15 0905  Weight: 63.504 kg (140 lb)    History of present illness:  35 year old woman with end-stage renal disease, behavioral issues resulting in dismissal from outpatient HD centers who presents to the emergency department with increasing lower extremity edema mild shortness of breath. Nephrology was consulted, requested observation for dialysis.  Hospital Course:  The patient was seen by nephrology and underwent successful hemodialysis. Nursing discovered the patient walking to the elevator to go home. Patient left AGAINST MEDICAL ADVICE. Hospitalization was uncomplicated.    Allergies  Allergen Reactions  . Ativan [Lorazepam] Swelling and Other (See Comments)    Dysarthria(patient has difficulty speaking and slurred speech); denies swelling, itching, pain, or numbness.  [name] [Ziprasidone Hcl] Itching and Swelling    Tongue swelling  . Keflex [Cephalexin] Swelling and Other (See Comments)    Tongue swelling. Can't talk   . Haldol [Haloperidol Lactate] Swelling    Tongue swelling. 05/31/15 - MD ok with giving as pt has tolerated in the past Pt can take benadryl.  . Other Itching    wool    The results of significant diagnostics from this hospitalization (including imaging, microbiology, ancillary and laboratory) are listed below for reference.    Significant Diagnostic Studies: Dg Chest 2 View  07/08/2015  CLINICAL DATA:  Left-sided chest pain, shortness of breath, productive cough. EXAM: CHEST  2 VIEW COMPARISON:  July 02, 2015.  February 07, 2015. FINDINGS: Moderate cardiomegaly is noted. No  pneumothorax or pleural effusion is noted. Stable mild central pulmonary vascular congestion is noted. Stable chronic resorption of both distal clavicles is noted. IMPRESSION: Stable cardiomegaly and mild central pulmonary vascular congestion. Electronically Signed   By: Marijo Conception, M.D.   On: 07/08/2015 13:32   Labs: Basic Metabolic Panel:  Recent Labs Lab 07/17/15 1754 07/18/15 0957 07/18/15 2156 07/19/15 0250 07/19/15 0636 07/20/15 1837 07/23/15 1330  NA 137 135 133*  --   --  140 137  K 4.7 4.9 5.2* 5.1 5.0 4.2 4.1  CL 97* 99* 99*  --   --  99* 100*  CO2 20*  --   --   --   --   --  20*  GLUCOSE 101* 82 119*  --   --  121* 126*  BUN 93* 93* 99*  --   --  63* 153*  CREATININE 13.06* 14.80* 15.50*  --   --  10.90* 14.17*  CALCIUM 9.7  --   --   --   --   --  8.9  PHOS  --   --   --   --   --   --  11.5*   Liver Function Tests:  Recent Labs Lab 07/23/15 1330  ALBUMIN 3.2*   CBC:  Recent Labs Lab 07/17/15 1754 07/18/15 0957 07/18/15 2156 07/19/15 1206 07/20/15 1837 07/23/15 1330  WBC 6.6  --   --  6.1  --  7.2  HGB 8.5* 10.2* 10.5* 9.0* 11.2* 8.2*  HCT 25.9* 30.0* 31.0* 26.4* 33.0* 24.8*  MCV 86.9  --   --  85.4  --  87.6  PLT 208  --   --  242  --  229        Active Problems:   Volume overload   ESRD (end stage renal disease) ([place])   Time: 10 minutes  Signed:  Murray Hodgkins, MD [facility] 07/24/2015, 3:59 PM

## 2015-07-25 ENCOUNTER — Emergency Department (HOSPITAL_COMMUNITY)
Admission: EM | Admit: 2015-07-25 | Discharge: 2015-07-25 | Disposition: A | Discharge status: Home / Self Care | Payer: Medicare Other | Attending: Emergency Medicine | Admitting: Emergency Medicine

## 2015-07-25 ENCOUNTER — Encounter (HOSPITAL_COMMUNITY): Payer: Self-pay

## 2015-07-25 DIAGNOSIS — I509 Heart failure, unspecified: Secondary | ICD-10-CM | POA: Insufficient documentation

## 2015-07-25 DIAGNOSIS — Z79899 Other long term (current) drug therapy: Secondary | ICD-10-CM | POA: Diagnosis not present

## 2015-07-25 DIAGNOSIS — Z992 Dependence on renal dialysis: Secondary | ICD-10-CM | POA: Insufficient documentation

## 2015-07-25 DIAGNOSIS — F17210 Nicotine dependence, cigarettes, uncomplicated: Secondary | ICD-10-CM | POA: Insufficient documentation

## 2015-07-25 DIAGNOSIS — N186 End stage renal disease: Secondary | ICD-10-CM | POA: Diagnosis not present

## 2015-07-25 DIAGNOSIS — I132 Hypertensive heart and chronic kidney disease with heart failure and with stage 5 chronic kidney disease, or end stage renal disease: Secondary | ICD-10-CM | POA: Insufficient documentation

## 2015-07-25 DIAGNOSIS — F250 Schizoaffective disorder, bipolar type: Secondary | ICD-10-CM | POA: Diagnosis not present

## 2015-07-25 DIAGNOSIS — N19 Unspecified kidney failure: Secondary | ICD-10-CM | POA: Diagnosis not present

## 2015-07-25 LAB — I-STAT CHEM 8, ED
BUN: 66 mg/dL — ABNORMAL HIGH (ref 6–20)
Calcium, Ion: 1.09 mmol/L — ABNORMAL LOW (ref 1.12–1.23)
Chloride: 99 mmol/L — ABNORMAL LOW (ref 101–111)
Creatinine, Ser: 11.1 mg/dL — ABNORMAL HIGH (ref 0.44–1.00)
Glucose, Bld: 101 mg/dL — ABNORMAL HIGH (ref 65–99)
HEMATOCRIT: 29 % — AB (ref 36.0–46.0)
HEMOGLOBIN: 9.9 g/dL — AB (ref 12.0–15.0)
Potassium: 3.9 mmol/L (ref 3.5–5.1)
SODIUM: 138 mmol/L (ref 135–145)
TCO2: 24 mmol/L (ref 0–100)

## 2015-07-25 NOTE — ED Notes (Signed)
MD at bedside. 
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
## 2015-07-25 NOTE — ED Notes (Signed)
Pt here for dialysis 

## 2015-07-25 NOTE — Discharge Instructions (Signed)
Come back another day for  Dialysis and get set up with [facility] as soon as possible

## 2015-07-27 ENCOUNTER — Observation Stay (HOSPITAL_COMMUNITY)
Admission: EM | Admit: 2015-07-27 | Discharge: 2015-07-27 | Disposition: A | Discharge status: Home / Self Care | Payer: Medicare Other | Attending: Internal Medicine | Admitting: Internal Medicine

## 2015-07-27 ENCOUNTER — Encounter (HOSPITAL_COMMUNITY): Payer: Self-pay

## 2015-07-27 DIAGNOSIS — Z79899 Other long term (current) drug therapy: Secondary | ICD-10-CM | POA: Diagnosis not present

## 2015-07-27 DIAGNOSIS — F250 Schizoaffective disorder, bipolar type: Secondary | ICD-10-CM | POA: Diagnosis not present

## 2015-07-27 DIAGNOSIS — N189 Chronic kidney disease, unspecified: Secondary | ICD-10-CM | POA: Diagnosis not present

## 2015-07-27 DIAGNOSIS — I10 Essential (primary) hypertension: Secondary | ICD-10-CM

## 2015-07-27 DIAGNOSIS — D631 Anemia in chronic kidney disease: Secondary | ICD-10-CM

## 2015-07-27 DIAGNOSIS — N186 End stage renal disease: Secondary | ICD-10-CM | POA: Diagnosis not present

## 2015-07-27 DIAGNOSIS — N19 Unspecified kidney failure: Secondary | ICD-10-CM | POA: Diagnosis not present

## 2015-07-27 DIAGNOSIS — I509 Heart failure, unspecified: Secondary | ICD-10-CM | POA: Insufficient documentation

## 2015-07-27 DIAGNOSIS — I132 Hypertensive heart and chronic kidney disease with heart failure and with stage 5 chronic kidney disease, or end stage renal disease: Secondary | ICD-10-CM | POA: Diagnosis not present

## 2015-07-27 DIAGNOSIS — F17210 Nicotine dependence, cigarettes, uncomplicated: Secondary | ICD-10-CM | POA: Diagnosis not present

## 2015-07-27 DIAGNOSIS — Z992 Dependence on renal dialysis: Secondary | ICD-10-CM | POA: Diagnosis not present

## 2015-07-27 DIAGNOSIS — R0602 Shortness of breath: Secondary | ICD-10-CM | POA: Diagnosis not present

## 2015-07-27 LAB — CBC
HEMATOCRIT: 24.8 % — AB (ref 36.0–46.0)
HEMOGLOBIN: 8.3 g/dL — AB (ref 12.0–15.0)
MCH: 29 pg (ref 26.0–34.0)
MCHC: 33.5 g/dL (ref 30.0–36.0)
MCV: 86.7 fL (ref 78.0–100.0)
Platelets: 243 10*3/uL (ref 150–400)
RBC: 2.86 MIL/uL — AB (ref 3.87–5.11)
RDW: 17.4 % — ABNORMAL HIGH (ref 11.5–15.5)
WBC: 7.9 10*3/uL (ref 4.0–10.5)

## 2015-07-27 LAB — BASIC METABOLIC PANEL
ANION GAP: 18 — AB (ref 5–15)
BUN: 101 mg/dL — ABNORMAL HIGH (ref 6–20)
CALCIUM: 9.7 mg/dL (ref 8.9–10.3)
CO2: 21 mmol/L — AB (ref 22–32)
Chloride: 101 mmol/L (ref 101–111)
Creatinine, Ser: 13.2 mg/dL — ABNORMAL HIGH (ref 0.44–1.00)
GFR calc Af Amer: 4 mL/min — ABNORMAL LOW (ref >60)
GFR calc non Af Amer: 3 mL/min — ABNORMAL LOW (ref >60)
GLUCOSE: 132 mg/dL — AB (ref 65–99)
Potassium: 4.7 mmol/L (ref 3.5–5.1)
Sodium: 140 mmol/L (ref 135–145)

## 2015-07-27 MED ORDER — LIDOCAINE HCL (PF) 1 % IJ SOLN
5.0000 mL | INTRAMUSCULAR | Status: DC | PRN
  Administered 2015-07-27: 1 mL via INTRADERMAL

## 2015-07-27 MED ORDER — ENOXAPARIN SODIUM 40 MG/0.4ML ~~LOC~~ SOLN: 40.0000 mg | SUBCUTANEOUS | Status: DC

## 2015-07-27 MED ORDER — METOPROLOL TARTRATE 50 MG PO TABS: 50.0000 mg | ORAL_TABLET | Freq: Two times a day (BID) | ORAL | Status: DC

## 2015-07-27 MED ORDER — LIDOCAINE-PRILOCAINE 2.5-2.5 % EX CREA: 1.0000 "application " | TOPICAL_CREAM | CUTANEOUS | Status: DC | PRN

## 2015-07-27 MED ORDER — EPOETIN ALFA 4000 UNIT/ML IJ SOLN
2000.0000 [IU] | Freq: Once | INTRAMUSCULAR | Status: AC
Start: 2015-07-27 — End: 2015-07-27
  Administered 2015-07-27: 2000 [IU] via SUBCUTANEOUS

## 2015-07-27 MED ORDER — LORATADINE 10 MG PO TABS: 10.0000 mg | ORAL_TABLET | Freq: Every day | ORAL | Status: DC

## 2015-07-27 MED ORDER — DIPHENHYDRAMINE HCL 25 MG PO CAPS
ORAL_CAPSULE | ORAL | Status: AC
  Administered 2015-07-27: 25 mg via ORAL
  Filled 2015-07-27: qty 1

## 2015-07-27 MED ORDER — SODIUM CHLORIDE 0.9 % IV SOLN: 100.0000 mL | INTRAVENOUS | Status: DC | PRN

## 2015-07-27 MED ORDER — LIDOCAINE HCL (PF) 1 % IJ SOLN
INTRAMUSCULAR | Status: AC
  Administered 2015-07-27: 1 mL via INTRADERMAL
  Filled 2015-07-27: qty 5

## 2015-07-27 MED ORDER — DIPHENHYDRAMINE HCL 25 MG PO CAPS
25.0000 mg | ORAL_CAPSULE | Freq: Four times a day (QID) | ORAL | Status: DC | PRN
  Administered 2015-07-27: 25 mg via ORAL

## 2015-07-27 MED ORDER — ACETAMINOPHEN 500 MG PO TABS: 1000.0000 mg | ORAL_TABLET | Freq: Four times a day (QID) | ORAL | Status: DC | PRN

## 2015-07-27 MED ORDER — ALTEPLASE 2 MG IJ SOLR
2.0000 mg | Freq: Once | INTRAMUSCULAR | Status: DC | PRN
  Filled 2015-07-27: qty 2

## 2015-07-27 MED ORDER — EPOETIN ALFA 4000 UNIT/ML IJ SOLN
INTRAMUSCULAR | Status: AC
  Administered 2015-07-27: 2000 [IU] via SUBCUTANEOUS
  Filled 2015-07-27: qty 1

## 2015-07-27 MED ORDER — PENTAFLUOROPROP-TETRAFLUOROETH EX AERO: 1.0000 "application " | INHALATION_SPRAY | CUTANEOUS | Status: DC | PRN

## 2015-07-27 MED ORDER — PREDNISONE 20 MG PO TABS: 20.0000 mg | ORAL_TABLET | Freq: Every day | ORAL | Status: DC

## 2015-07-27 MED ORDER — TRAMADOL HCL 50 MG PO TABS
50.0000 mg | ORAL_TABLET | Freq: Two times a day (BID) | ORAL | Status: AC | PRN
Start: 2015-07-27 — End: 2015-07-27
  Administered 2015-07-27: 50 mg via ORAL

## 2015-07-27 MED ORDER — FUROSEMIDE 80 MG PO TABS: 120.0000 mg | ORAL_TABLET | Freq: Every day | ORAL | Status: DC

## 2015-07-27 MED ORDER — HEPARIN SODIUM (PORCINE) 1000 UNIT/ML IJ SOLN
INTRAMUSCULAR | Status: AC
  Administered 2015-07-27: 1900 [IU] via INTRAVENOUS_CENTRAL
  Filled 2015-07-27: qty 5

## 2015-07-27 MED ORDER — HEPARIN SODIUM (PORCINE) 1000 UNIT/ML DIALYSIS
1000.0000 [IU] | INTRAMUSCULAR | Status: DC | PRN
  Filled 2015-07-27: qty 1

## 2015-07-27 MED ORDER — TRAMADOL HCL 50 MG PO TABS
ORAL_TABLET | ORAL | Status: AC
  Administered 2015-07-27: 50 mg via ORAL
  Filled 2015-07-27: qty 1

## 2015-07-27 MED ORDER — HEPARIN SODIUM (PORCINE) 1000 UNIT/ML DIALYSIS
20.0000 [IU]/kg | INTRAMUSCULAR | Status: DC | PRN
  Administered 2015-07-27: 1900 [IU] via INTRAVENOUS_CENTRAL
  Filled 2015-07-27 (×2): qty 2
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
## 2015-07-27 NOTE — Discharge Summary (Signed)
Physician Discharge Summary  Sara [name] [name] DOB: 1980-08-18 DOA: 07/27/2015  PCP: No PCP Per Patient  Admit date: 07/27/2015 Discharge date: 07/27/2015  Time spent: 25 minutes  Recommendations for Outpatient Follow-up:  1. Follow up at regular dialysis center   Discharge Diagnoses:  Active Problems:   ESRD (end stage renal disease) ([place])   Renal failure   Discharge Condition: stable  Diet recommendation: low salt  Filed Weights   07/27/15 0945 07/27/15 1500 07/27/15 1505  Weight: 92.534 kg (204 lb) 92.08 kg (203 lb) 92.5 kg (203 lb 14.8 oz)    History of present illness:  Sara [name] is a 34 y.o. female with a history of end-stage renal disease on hemodialysis who is well-known to the system. She presents to the emergency room today for routine dialysis. She denies any chest pain or shortness of breath. She reports improvement of her lower extremity edema. Patient has a history of noncompliance.  Hospital Course:  Patient was admitted to the hospital for observation to undergo dialysis. She underwent a full treatment without any complications. Blood pressure has been stable. Patient wishes to discharge home. She is otherwise stable.  Procedures:  hemodialysis  Consultations:  Nephrology  Discharge Exam: Filed Vitals:   07/27/15 1830 07/27/15 1900  BP: 146/84 155/92  Pulse: 101 100  Temp:    Resp:      General: NAD Cardiovascular: S1, s2 RRR Respiratory: CTA B  Discharge Instructions   Discharge Instructions    Diet - low sodium heart healthy    Complete by:  As directed      Increase activity slowly    Complete by:  As directed           Current Discharge Medication List    CONTINUE these medications which have NOT CHANGED   Details  acetaminophen (TYLENOL) 500 MG tablet Take 1,000 mg by mouth every 6 (six) hours as needed for moderate pain. Reported on 06/12/2015    clindamycin (CLINDAGEL) 1 % gel Apply topically 2 (two)  times daily. Qty: 30 g, Refills: 0    furosemide (LASIX) 40 MG tablet Take 3 tablets (120 mg total) by mouth daily. Qty: 90 tablet, Refills: 0    loratadine (CLARITIN) 10 MG tablet Take 1 tablet (10 mg total) by mouth daily. Take daily. Qty: 20 tablet, Refills: 0    metoprolol (LOPRESSOR) 50 MG tablet Take 1 tablet (50 mg total) by mouth 2 (two) times daily. Qty: 60 tablet, Refills: 0    predniSONE (DELTASONE) 20 MG tablet Take 1 tablet (20 mg total) by mouth daily with breakfast.       Allergies  Allergen Reactions  . Ativan [Lorazepam] Swelling and Other (See Comments)    Dysarthria(patient has difficulty speaking and slurred speech); denies swelling, itching, pain, or numbness.  Sara [name] [Ziprasidone Hcl] Itching and Swelling    Tongue swelling  . Keflex [Cephalexin] Swelling and Other (See Comments)    Tongue swelling. Can't talk   . Haldol [Haloperidol Lactate] Swelling    Tongue swelling. 05/31/15 - MD ok with giving as pt has tolerated in the past Pt can take benadryl.  . Other Itching    wool      The results of significant diagnostics from this hospitalization (including imaging, microbiology, ancillary and laboratory) are listed below for reference.    Significant Diagnostic Studies: Dg Chest 2 View  07/08/2015  CLINICAL DATA:  Left-sided chest pain, shortness of breath, productive cough. EXAM: CHEST  2  VIEW COMPARISON:  July 02, 2015.  February 07, 2015. FINDINGS: Moderate cardiomegaly is noted. No pneumothorax or pleural effusion is noted. Stable mild central pulmonary vascular congestion is noted. Stable chronic resorption of both distal clavicles is noted. IMPRESSION: Stable cardiomegaly and mild central pulmonary vascular congestion. Electronically Signed   By: Marijo Conception, M.D.   On: 07/08/2015 13:32   Dg Chest 2 View  07/02/2015  CLINICAL DATA:  Shortness of breath for 5 days. Cough. History of renal failure EXAM: CHEST  2 VIEW COMPARISON:  June 14, 2015  FINDINGS: There is no edema or consolidation. There is moderate cardiac enlargement. The pulmonary vascular is normal. No adenopathy. No bone lesions. IMPRESSION: Moderate cardiac enlargement. Question underlying pericardial effusion. No edema or consolidation. Electronically Signed   By: Lowella Grip III M.D.   On: 07/02/2015 11:28    Microbiology: No results found for this or any previous visit (from the past 240 hour(s)).   Labs: Basic Metabolic Panel:  Recent Labs Lab 07/23/15 1330 07/25/15 0938 07/27/15 1044  NA 137 138 140  K 4.1 3.9 4.7  CL 100* 99* 101  CO2 20*  --  21*  GLUCOSE 126* 101* 132*  BUN 153* 66* 101*  CREATININE 14.17* 11.10* 13.20*  CALCIUM 8.9  --  9.7  PHOS 11.5*  --   --    Liver Function Tests:  Recent Labs Lab 07/23/15 1330  ALBUMIN 3.2*   No results for input(s): LIPASE, AMYLASE in the last 168 hours. No results for input(s): AMMONIA in the last 168 hours. CBC:  Recent Labs Lab 07/23/15 1330 07/25/15 0938 07/27/15 1822  WBC 7.2  --  7.9  HGB 8.2* 9.9* 8.3*  HCT 24.8* 29.0* 24.8*  MCV 87.6  --  86.7  PLT 229  --  243   Cardiac Enzymes: No results for input(s): CKTOTAL, CKMB, CKMBINDEX, TROPONINI in the last 168 hours. BNP: BNP (last 3 results)  Recent Labs  09/11/14 0815  BNP 1694.9*    ProBNP (last 3 results) No results for input(s): PROBNP in the last 8760 hours.  CBG: No results for input(s): GLUCAP in the last 168 hours.     SignedKathie Dike MD.  [facility] 07/27/2015, 7:25 PM

## 2015-07-27 NOTE — ED Notes (Signed)
Pt says is here for dialysis.  Last treatment was Monday.

## 2015-07-27 NOTE — Consult Note (Signed)
Sara [name] MRN: [id number] DOB/AGE: [birth year] 35 y.o. Primary Care Physician:No PCP Per Patient Admit date: 07/27/2015 Chief Complaint:  Chief Complaint  Patient presents with  . dialysis    HPI: Pt is 35 year old female with past medical hx of ESRD who was admitted with c/o dyspnea," I need dialysis "  HPI dates back to few days ago started feeling short of breath,and so pt came to ER asking for her dialysis. Pt says "I  had my dialysis 4 days ago, I am still over my dry weight, I need my dialysis ". NO c/o chest pain No c/o fever/cough/chills NO c/o nausea/vomiting/ diarrhea  no c/o hematuria Pt was seen  for need of renal replacement therapy.  Past Medical History  Diagnosis Date  . Lupus ([place])     lupus w nephritis  . History of blood transfusion     "this is probably my 3rd" (10/09/2014)  . ESRD (end stage renal disease) on dialysis Sara [name])     "[name]; [facility]" (10/09/2014)  . Bipolar disorder ([place])     Sara [name] 10/09/2014  . Schizophrenia ([place])     Sara [name] 10/09/2014  . Chronic anemia     Sara [name] 10/09/2014  . CHF (congestive heart failure) ([place])     systolic/notes [id number]  . Acute myopericarditis     hx/notes 10/09/2014  . Psychosis   . Hypertension   . Pregnancy   . Low back pain   . Positive ANA (antinuclear antibody) 08/16/2012  . 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 Tobacco abuse 02/20/2014  . Schizoaffective disorder, bipolar type ([place]) 11/20/2014  . Non-compliant patient         Family History  Problem Relation Age of Onset  . Drug abuse Father   . Kidney disease Father     Social History:  reports that she has been smoking Cigarettes.  She has a 2 pack-year smoking history. She uses smokeless tobacco. She reports that she drinks about 4.2 oz of alcohol per week. She reports that she uses illicit drugs (Marijuana and Cocaine).   Allergies:  Allergies  Allergen Reactions  . Ativan [Lorazepam] Swelling and Other (See  Comments)    Dysarthria(patient has difficulty speaking and slurred speech); denies swelling, itching, pain, or numbness.  [name] [Ziprasidone Hcl] Itching and Swelling    Tongue swelling  . Keflex [Cephalexin] Swelling and Other (See Comments)    Tongue swelling. Can't talk   . Haldol [Haloperidol Lactate] Swelling    Tongue swelling. 05/31/15 - MD ok with giving as pt has tolerated in the past Pt can take benadryl.  . Other Itching    wool     (Not in a [name] admission)     [id number] from the symptoms mentioned above,there are no other symptoms referable to all systems reviewed.      Physical Exam: Vital signs in last 24 hours: Temp:  [98.4 F (36.9 C)] 98.4 F (36.9 C) (04/21 0945) Pulse Rate:  [106] 106 (04/21 0945) Resp:  [18] 18 (04/21 0945) BP: (155)/(107) 155/107 mmHg (04/21 0945) SpO2:  [100 %] 100 % (04/21 0945) Weight:  [204 lb (92.534 kg)] 204 lb (92.534 kg) (04/21 0945) Weight change:     Intake/Output from previous day:       Physical Exam: General- pt is awake,alert, follows coomands Resp- No acute REsp distress,  decreased bs at bases. CVS- S1S2 regular in rate and rhythm, NO rubs  GIT- BS+, soft, NT, ND  EXT- 2+ LE Edema, NO Cyanosis CNS- CN 2-12 grossly intact. Moving all 4 extremities Access- AVF+, aneurysm present  Addendum  Two needles in situ Lab Results:  CBC    Component Value Date/Time   WBC 7.2 07/23/2015 1330   RBC 2.83* 07/23/2015 1330   RBC 2.77* 06/21/2015 1022   HGB 9.9* 07/25/2015 0938   HCT 29.0* 07/25/2015 0938   PLT 229 07/23/2015 1330   MCV 87.6 07/23/2015 1330   MCH 29.0 07/23/2015 1330   MCHC 33.1 07/23/2015 1330   RDW 17.4* 07/23/2015 1330   LYMPHSABS 1.3 07/08/2015 1502   MONOABS 0.6 07/08/2015 1502   EOSABS 0.1 07/08/2015 1502   BASOSABS 0.0 07/08/2015 1502      BMET  Recent Labs  07/25/15 0938 07/27/15 1044  NA 138 140  K 3.9 4.7  CL 99* 101  CO2  --  21*  GLUCOSE 101* 132*  BUN 66*  101*  CREATININE 11.10* 13.20*  CALCIUM  --  9.7    MICRO No results found for this or any previous visit (from the past 240 hour(s)).    Lab Results  Component Value Date   PTH 420* 05/26/2015   CALCIUM 9.7 07/27/2015   CAION 1.09* 07/25/2015   PHOS 11.5* 07/23/2015      Impression: 1)Renal  ESRD on HD                Pt is not in regular  Schedule sec to her complaince/adherence issues                Pt last HD was on 07/23/15                Will dialyze pt today  2)HTN Bp is not at goal  3)Anemia In ESRD the goal for HGb is 9--11. Pt HGb is  at goal Will keep  on epo   4)CKD Mineral-Bone Disorder PTH acceptable. Secondary Hyperparathyroidism  Present. Phosphorus not at goal.  on binders  5)Psych .  Hx of   psycosis Schizophrenia Primary MD following  6)Electrolytes  Normokalemic     NOrmonatremic   7)Acid base Co2 just  at goal Sec to no adherence to HD   Plan:  Will dialyze today Will keep  on epo Will use 2 k bath Will try to take 3 liters off      Sara [name] S 07/27/2015, 12:30 PM

## 2015-07-30 ENCOUNTER — Observation Stay (HOSPITAL_COMMUNITY)
Admission: EM | Admit: 2015-07-30 | Discharge: 2015-07-30 | Disposition: A | Discharge status: Home / Self Care | Payer: Medicare Other | Attending: Internal Medicine | Admitting: Internal Medicine

## 2015-07-30 ENCOUNTER — Encounter (HOSPITAL_COMMUNITY): Payer: Self-pay

## 2015-07-30 DIAGNOSIS — N186 End stage renal disease: Secondary | ICD-10-CM | POA: Diagnosis present

## 2015-07-30 DIAGNOSIS — Z79899 Other long term (current) drug therapy: Secondary | ICD-10-CM | POA: Insufficient documentation

## 2015-07-30 DIAGNOSIS — D631 Anemia in chronic kidney disease: Secondary | ICD-10-CM

## 2015-07-30 DIAGNOSIS — I509 Heart failure, unspecified: Secondary | ICD-10-CM | POA: Diagnosis not present

## 2015-07-30 DIAGNOSIS — F17210 Nicotine dependence, cigarettes, uncomplicated: Secondary | ICD-10-CM | POA: Insufficient documentation

## 2015-07-30 DIAGNOSIS — I10 Essential (primary) hypertension: Secondary | ICD-10-CM | POA: Diagnosis not present

## 2015-07-30 DIAGNOSIS — F250 Schizoaffective disorder, bipolar type: Secondary | ICD-10-CM | POA: Diagnosis not present

## 2015-07-30 DIAGNOSIS — N189 Chronic kidney disease, unspecified: Secondary | ICD-10-CM

## 2015-07-30 DIAGNOSIS — I132 Hypertensive heart and chronic kidney disease with heart failure and with stage 5 chronic kidney disease, or end stage renal disease: Secondary | ICD-10-CM | POA: Diagnosis not present

## 2015-07-30 DIAGNOSIS — Z992 Dependence on renal dialysis: Secondary | ICD-10-CM | POA: Diagnosis not present

## 2015-07-30 DIAGNOSIS — I120 Hypertensive chronic kidney disease with stage 5 chronic kidney disease or end stage renal disease: Secondary | ICD-10-CM | POA: Diagnosis not present

## 2015-07-30 LAB — CBC
HCT: 26.3 % — ABNORMAL LOW (ref 36.0–46.0)
Hemoglobin: 8.7 g/dL — ABNORMAL LOW (ref 12.0–15.0)
MCH: 28.8 pg (ref 26.0–34.0)
MCHC: 33.1 g/dL (ref 30.0–36.0)
MCV: 87.1 fL (ref 78.0–100.0)
PLATELETS: 231 10*3/uL (ref 150–400)
RBC: 3.02 MIL/uL — AB (ref 3.87–5.11)
RDW: 17.3 % — ABNORMAL HIGH (ref 11.5–15.5)
WBC: 8.3 10*3/uL (ref 4.0–10.5)

## 2015-07-30 LAB — BASIC METABOLIC PANEL
Anion gap: 18 — ABNORMAL HIGH (ref 5–15)
BUN: 86 mg/dL — AB (ref 6–20)
CALCIUM: 9.6 mg/dL (ref 8.9–10.3)
CO2: 22 mmol/L (ref 22–32)
Chloride: 100 mmol/L — ABNORMAL LOW (ref 101–111)
Creatinine, Ser: 11.61 mg/dL — ABNORMAL HIGH (ref 0.44–1.00)
GFR calc Af Amer: 4 mL/min — ABNORMAL LOW (ref >60)
GFR, EST NON AFRICAN AMERICAN: 4 mL/min — AB (ref >60)
Glucose, Bld: 85 mg/dL (ref 65–99)
POTASSIUM: 4.7 mmol/L (ref 3.5–5.1)
Sodium: 140 mmol/L (ref 135–145)

## 2015-07-30 MED ORDER — EPOETIN ALFA 10000 UNIT/ML IJ SOLN
INTRAMUSCULAR | Status: AC
  Administered 2015-07-30: 10000 [IU] via INTRAVENOUS
  Filled 2015-07-30: qty 1

## 2015-07-30 MED ORDER — FUROSEMIDE 80 MG PO TABS
120.0000 mg | ORAL_TABLET | Freq: Every day | ORAL | Status: DC
  Administered 2015-07-30: 120 mg via ORAL
  Filled 2015-07-30: qty 1

## 2015-07-30 MED ORDER — DIPHENHYDRAMINE HCL 25 MG PO CAPS
ORAL_CAPSULE | ORAL | Status: AC
  Administered 2015-07-30: 25 mg via ORAL
  Filled 2015-07-30: qty 1

## 2015-07-30 MED ORDER — SODIUM CHLORIDE 0.9 % IV SOLN: 100.0000 mL | INTRAVENOUS | Status: DC | PRN

## 2015-07-30 MED ORDER — HEPARIN SODIUM (PORCINE) 1000 UNIT/ML IJ SOLN
INTRAMUSCULAR | Status: AC
Start: 2015-07-30 — End: 2015-07-30
  Administered 2015-07-30: 2500 [IU] via INTRAVENOUS_CENTRAL
  Filled 2015-07-30: qty 5

## 2015-07-30 MED ORDER — HEPARIN SODIUM (PORCINE) 1000 UNIT/ML DIALYSIS
2500.0000 [IU] | Freq: Once | INTRAMUSCULAR | Status: AC
  Administered 2015-07-30: 2500 [IU] via INTRAVENOUS_CENTRAL

## 2015-07-30 MED ORDER — LIDOCAINE-PRILOCAINE 2.5-2.5 % EX CREA: 1.0000 "application " | TOPICAL_CREAM | CUTANEOUS | Status: DC | PRN

## 2015-07-30 MED ORDER — PENTAFLUOROPROP-TETRAFLUOROETH EX AERO: 1.0000 "application " | INHALATION_SPRAY | CUTANEOUS | Status: DC | PRN

## 2015-07-30 MED ORDER — DIPHENHYDRAMINE HCL 25 MG PO CAPS
25.0000 mg | ORAL_CAPSULE | Freq: Four times a day (QID) | ORAL | Status: AC | PRN
  Administered 2015-07-30: 25 mg via ORAL

## 2015-07-30 MED ORDER — EPOETIN ALFA 10000 UNIT/ML IJ SOLN
10000.0000 [IU] | Freq: Once | INTRAMUSCULAR | Status: AC
  Administered 2015-07-30: 10000 [IU] via INTRAVENOUS
  Filled 2015-07-30: qty 1

## 2015-07-30 MED ORDER — PREDNISONE 20 MG PO TABS: 20.0000 mg | ORAL_TABLET | Freq: Every day | ORAL | Status: DC

## 2015-07-30 MED ORDER — LORATADINE 10 MG PO TABS
10.0000 mg | ORAL_TABLET | Freq: Every day | ORAL | Status: DC
  Administered 2015-07-30: 10 mg via ORAL
  Filled 2015-07-30: qty 1

## 2015-07-30 MED ORDER — TRAMADOL HCL 50 MG PO TABS
ORAL_TABLET | ORAL | Status: AC
  Administered 2015-07-30: 50 mg via ORAL
  Filled 2015-07-30: qty 1

## 2015-07-30 MED ORDER — TRAMADOL HCL 50 MG PO TABS
50.0000 mg | ORAL_TABLET | Freq: Two times a day (BID) | ORAL | Status: DC | PRN
  Administered 2015-07-30: 50 mg via ORAL

## 2015-07-30 MED ORDER — METOPROLOL TARTRATE 50 MG PO TABS
50.0000 mg | ORAL_TABLET | Freq: Two times a day (BID) | ORAL | Status: DC
  Administered 2015-07-30: 50 mg via ORAL
  Filled 2015-07-30: qty 1

## 2015-07-30 MED ORDER — LIDOCAINE HCL (PF) 1 % IJ SOLN
5.0000 mL | INTRAMUSCULAR | Status: DC | PRN
  Administered 2015-07-30: 0.5 mL via INTRADERMAL

## 2015-07-30 MED ORDER — LIDOCAINE HCL (PF) 1 % IJ SOLN
INTRAMUSCULAR | Status: AC
Start: 2015-07-30 — End: 2015-07-30
  Administered 2015-07-30: 0.5 mL via INTRADERMAL
  Filled 2015-07-30: qty 5
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
## 2015-07-30 NOTE — ED Notes (Signed)
Pt refused IV  

## 2015-07-30 NOTE — Discharge Summary (Signed)
Physician Discharge Summary  [name] [name] DOB: 1980-09-29 DOA: 07/30/2015  PCP: No PCP Per Patient  Admit date: 07/30/2015 Discharge date: 07/30/2015  Time spent: 25 minutes  Recommendations for Outpatient Follow-up:  1. Follow up at regular dialysis center once set up 2. Advised to be compliant with medications   Discharge Diagnoses:  Active Problems:   ESRD (end stage renal disease) ([place])   End stage renal disease on dialysis [facility])   Discharge Condition: stable  Diet recommendation: low salt  Filed Weights   07/30/15 0823 07/30/15 1137  Weight: 63.504 kg (140 lb) 64.1 kg (141 lb 5 oz)    History of present illness:  Sara [name] is a 35 y.o. female with a history of end-stage renal disease on hemodialysis who is well-known to the system. She presents to the ED today for routine dialysis. She denies any chest pain or shortness of breath. She has not had any palpitations. No cough or fever. She reports recent improvement of her lower extremity edema. Patient has a history of noncompliance.  Hospital Course:  Patient was admitted to the hospital for observation to undergo dialysis. She underwent a full treatment of 4.5 hours with 4L of fluid removed. She did not have any symptoms including chest pain, shortness of breath, palpitations or dizziness. Blood pressure has been elevated and she has been tachycardic, but admits to not taking her metoprolol prior to arrival. Patient wishes to discharge home. She is otherwise stable.  Procedures:  hemodialysis  Consultations:  Nephrology  Discharge Exam: Filed Vitals:   07/30/15 1615 07/30/15 1657  BP: 179/105 189/92  Pulse: 114 112  Temp:    Resp:  112    General: NAD Cardiovascular: S1, s2 tachy Respiratory: CTA B  Discharge Instructions   Discharge Instructions    Diet - low sodium heart healthy    Complete by:  As directed      Increase activity slowly    Complete by:  As directed            Current Discharge Medication List    CONTINUE these medications which have NOT CHANGED   Details  furosemide (LASIX) 40 MG tablet Take 3 tablets (120 mg total) by mouth daily. Qty: 90 tablet, Refills: 0    loratadine (CLARITIN) 10 MG tablet Take 1 tablet (10 mg total) by mouth daily. Take daily. Qty: 20 tablet, Refills: 0    metoprolol (LOPRESSOR) 50 MG tablet Take 1 tablet (50 mg total) by mouth 2 (two) times daily. Qty: 60 tablet, Refills: 0    predniSONE (DELTASONE) 20 MG tablet Take 1 tablet (20 mg total) by mouth daily with breakfast.    acetaminophen (TYLENOL) 500 MG tablet Take 1,000 mg by mouth every 6 (six) hours as needed for moderate pain. Reported on 06/12/2015    clindamycin (CLINDAGEL) 1 % gel Apply topically 2 (two) times daily. Qty: 30 g, Refills: 0       Allergies  Allergen Reactions  . Ativan [Lorazepam] Swelling and Other (See Comments)    Dysarthria(patient has difficulty speaking and slurred speech); denies swelling, itching, pain, or numbness.  [name] [Ziprasidone Hcl] Itching and Swelling    Tongue swelling  . Keflex [Cephalexin] Swelling and Other (See Comments)    Tongue swelling. Can't talk   . Haldol [Haloperidol Lactate] Swelling    Tongue swelling. 05/31/15 - MD ok with giving as pt has tolerated in the past Pt can take benadryl.  . Other Itching  wool      The results of significant diagnostics from this hospitalization (including imaging, microbiology, ancillary and laboratory) are listed below for reference.    Significant Diagnostic Studies: Dg Chest 2 View  07/08/2015  CLINICAL DATA:  Left-sided chest pain, shortness of breath, productive cough. EXAM: CHEST  2 VIEW COMPARISON:  July 02, 2015.  February 07, 2015. FINDINGS: Moderate cardiomegaly is noted. No pneumothorax or pleural effusion is noted. Stable mild central pulmonary vascular congestion is noted. Stable chronic resorption of both distal clavicles is noted. IMPRESSION:  Stable cardiomegaly and mild central pulmonary vascular congestion. Electronically Signed   By: Marijo Conception, M.D.   On: 07/08/2015 13:32   Dg Chest 2 View  07/02/2015  CLINICAL DATA:  Shortness of breath for 5 days. Cough. History of renal failure EXAM: CHEST  2 VIEW COMPARISON:  June 14, 2015 FINDINGS: There is no edema or consolidation. There is moderate cardiac enlargement. The pulmonary vascular is normal. No adenopathy. No bone lesions. IMPRESSION: Moderate cardiac enlargement. Question underlying pericardial effusion. No edema or consolidation. Electronically Signed   By: Lowella Grip III M.D.   On: 07/02/2015 11:28    Microbiology: No results found for this or any previous visit (from the past 240 hour(s)).   Labs: Basic Metabolic Panel:  Recent Labs Lab 07/25/15 0938 07/27/15 1044 07/30/15 0829  NA 138 140 140  K 3.9 4.7 4.7  CL 99* 101 100*  CO2  --  21* 22  GLUCOSE 101* 132* 85  BUN 66* 101* 86*  CREATININE 11.10* 13.20* 11.61*  CALCIUM  --  9.7 9.6   Liver Function Tests: No results for input(s): AST, ALT, ALKPHOS, BILITOT, PROT, ALBUMIN in the last 168 hours. No results for input(s): LIPASE, AMYLASE in the last 168 hours. No results for input(s): AMMONIA in the last 168 hours. CBC:  Recent Labs Lab 07/25/15 0938 07/27/15 1822 07/30/15 0829  WBC  --  7.9 8.3  HGB 9.9* 8.3* 8.7*  HCT 29.0* 24.8* 26.3*  MCV  --  86.7 87.1  PLT  --  243 231   Cardiac Enzymes: No results for input(s): CKTOTAL, CKMB, CKMBINDEX, TROPONINI in the last 168 hours. BNP: BNP (last 3 results)  Recent Labs  09/11/14 0815  BNP 1694.9*    ProBNP (last 3 results) No results for input(s): PROBNP in the last 8760 hours.  CBG: No results for input(s): GLUCAP in the last 168 hours.     SignedKathie Dike MD.  [facility] 07/30/2015, 6:15 PM

## 2015-07-30 NOTE — Procedures (Signed)
   HEMODIALYSIS TREATMENT NOTE:  4.5 hour low-heparin dialysis completed via right upper arm AVF (15g ante/retrograde). Goal met: 4 L removed without interruption in ultrafiltration. All blood returned and hemostasis achieved within 10 minutes. Report called to Vista Deck, RN.  Adherence with anti-hypertensive medications encouraged.  Rockwell Alexandria, RN, CDN

## 2015-07-30 NOTE — Progress Notes (Addendum)
Discharge orders including medications and follow up appointments were reviewed and discussed with patient. Pt verbalized understanding of discharge orders including medications. All questions were answered and no further questions at this time. Pt in stable condition and in no acute distress at time of discharge.
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
## 2015-07-30 NOTE — ED Notes (Signed)
Pt here for dialysis treatment.  Last had dialysis Friday.

## 2015-08-01 ENCOUNTER — Encounter (HOSPITAL_COMMUNITY): Payer: Self-pay | Admitting: *Deleted

## 2015-08-01 ENCOUNTER — Emergency Department (HOSPITAL_COMMUNITY)
Admission: EM | Admit: 2015-08-01 | Discharge: 2015-08-01 | Disposition: A | Discharge status: Home / Self Care | Payer: Medicare Other | Attending: Emergency Medicine | Admitting: Emergency Medicine

## 2015-08-01 DIAGNOSIS — R52 Pain, unspecified: Secondary | ICD-10-CM

## 2015-08-01 DIAGNOSIS — F17210 Nicotine dependence, cigarettes, uncomplicated: Secondary | ICD-10-CM | POA: Insufficient documentation

## 2015-08-01 DIAGNOSIS — R000 Tachycardia, unspecified: Secondary | ICD-10-CM | POA: Diagnosis not present

## 2015-08-01 DIAGNOSIS — N186 End stage renal disease: Secondary | ICD-10-CM

## 2015-08-01 DIAGNOSIS — F209 Schizophrenia, unspecified: Secondary | ICD-10-CM | POA: Diagnosis not present

## 2015-08-01 DIAGNOSIS — Z4931 Encounter for adequacy testing for hemodialysis: Secondary | ICD-10-CM | POA: Diagnosis present

## 2015-08-01 DIAGNOSIS — I132 Hypertensive heart and chronic kidney disease with heart failure and with stage 5 chronic kidney disease, or end stage renal disease: Secondary | ICD-10-CM | POA: Insufficient documentation

## 2015-08-01 DIAGNOSIS — F319 Bipolar disorder, unspecified: Secondary | ICD-10-CM | POA: Insufficient documentation

## 2015-08-01 DIAGNOSIS — M791 Myalgia: Secondary | ICD-10-CM | POA: Diagnosis not present

## 2015-08-01 DIAGNOSIS — Z992 Dependence on renal dialysis: Secondary | ICD-10-CM | POA: Diagnosis not present

## 2015-08-01 DIAGNOSIS — I509 Heart failure, unspecified: Secondary | ICD-10-CM | POA: Diagnosis not present

## 2015-08-01 DIAGNOSIS — I120 Hypertensive chronic kidney disease with stage 5 chronic kidney disease or end stage renal disease: Secondary | ICD-10-CM | POA: Diagnosis not present

## 2015-08-01 LAB — I-STAT CHEM 8, ED
BUN: 58 mg/dL — AB (ref 6–20)
CALCIUM ION: 1.09 mmol/L — AB (ref 1.12–1.23)
Chloride: 97 mmol/L — ABNORMAL LOW (ref 101–111)
Creatinine, Ser: 9.9 mg/dL — ABNORMAL HIGH (ref 0.44–1.00)
Glucose, Bld: 114 mg/dL — ABNORMAL HIGH (ref 65–99)
HCT: 28 % — ABNORMAL LOW (ref 36.0–46.0)
Hemoglobin: 9.5 g/dL — ABNORMAL LOW (ref 12.0–15.0)
Potassium: 3.6 mmol/L (ref 3.5–5.1)
Sodium: 138 mmol/L (ref 135–145)
TCO2: 26 mmol/L (ref 0–100)
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
## 2015-08-01 NOTE — ED Notes (Signed)
Pt in waiting room behind triage, EDP aware and will assess patient. i-stat Chem 8 drawn. Patient in nad.

## 2015-08-01 NOTE — Discharge Summary (Signed)
Physician Discharge Summary  [name] [name] DOB: May 17, 1980 DOA: 07/18/2015  PCP: No PCP Per Patient  Admit date: 07/18/2015 Discharge date: 08/01/2015  Time spent: 35 minutes  Recommendations for Outpatient Follow-up:  Left AMA  Discharge Diagnoses:     Encounter for hemodialysis for end-stage renal disease ([place]) Active Problems:   HTN (hypertension)   Hyperkalemia   Lupus (systemic lupus erythematosus) ([place])   Discharge Condition: Left AMA  Diet recommendation: renal diet   Filed Weights   07/19/15 0158 07/19/15 0500 07/19/15 1140  Weight: 89.7 kg (197 lb 12 oz) 89.585 kg (197 lb 8 oz) 89.7 kg (197 lb 12 oz)    History of present illness:  Sara [name] is a 35 y.o. female with PMH of lupus with associated nephritis and end-stage renal disease on hemodialysis, hypertension, psychosis, and chronic systolic CHF who presents to the ED requesting hemodialysis in the setting of not receiving HD since 07/13/2015. Patient's care has been complicated by her nonadherence to the treatment plan as well as violent outbursts directed towards staff. For this reason, she has been discharged from outpatient dialysis center and is dependent on local EDs for her maintenance dialysis. She was last dialyzed on 07/13/2015, at which time she completed the session without incident. She was seen at [facility] emergency department 2 days ago, was asked to return the following morning for dialysis as they were quite busy at that time. She returned the following day, but was refused dialysis by the on-call nephrologist as it was not considered emergent at that time. She reports developing mild dyspnea with exertion over the past 1-2 days, but denies chest pains or palpitations. She denies fevers, chills, or productive cough. She also denies abdominal pain, nausea, vomiting, or diarrhea.  In ED, patient was found to be afebrile, saturating adequately on room air, with borderline tachycardia  and blood pressure in the 160/110 range. BMP features a serum potassium of 5.2, BUN of 99, and serum creatinine of 15.5. Hemoglobin is 10.5, higher than recent measurements. 30 g of Kayexalate was administered by mouth, patient was monitored on telemetry, remained stable in the emergency department, and will be admitted for management of hyperkalemia. Maintenance dialysis will be requested in the morning.  Hospital Course:  1. Hyperkalemia  - Received Kayexalate in the ED - HD today   2. ESRD - Attributed to lupus nephritis  - Dependent on local EDs for HD since her discharge from outpt center d/t non-adherence to the treatment plan and behavioral problems  - Last dialyzed 07/13/15 and reportedly completed the session without incident at that time  - Serum potassium is mildly elevated; she reports mild dyspnea but saturating well on rm air without apparent distress, BP is elevated to the 170/110 range  - HD today   3. HTN  - Elevated to 160/110 on admission  - Continue with Lopressor BID  -received one time dose of Norvasc.    4. Lupus  -Continue with prednisone 20 mg daily.  Procedures:  Dialysis.  Consultations:  Nephrology   Discharge Exam: Filed Vitals:   07/19/15 1530 07/19/15 1600  BP: 166/99 169/98  Pulse: 101 102  Temp:    Resp:       Discharge Instructions    Discharge Medication List as of 07/19/2015  5:26 PM    CONTINUE these medications which have NOT CHANGED   Details  acetaminophen (TYLENOL) 500 MG tablet Take 1,000 mg by mouth every 6 (six) hours as needed for  moderate pain. Reported on 06/12/2015, Until Discontinued, Historical Med    furosemide (LASIX) 40 MG tablet Take 3 tablets (120 mg total) by mouth daily., Starting 07/02/2015, Until Discontinued, Print    loratadine (CLARITIN) 10 MG tablet Take 1 tablet (10 mg total) by mouth daily. Take daily., Starting 07/02/2015, Until Discontinued, Print    metoprolol (LOPRESSOR) 50 MG tablet Take  1 tablet (50 mg total) by mouth 2 (two) times daily., Starting 07/02/2015, Until Discontinued, Print    predniSONE (DELTASONE) 20 MG tablet Take 1 tablet (20 mg total) by mouth daily with breakfast., Starting 07/02/2015, Until Discontinued, No Print    clindamycin (CLINDAGEL) 1 % gel Apply topically 2 (two) times daily., Starting 07/17/2015, Until Discontinued, Print    guaiFENesin (MUCINEX) 600 MG 12 hr tablet Take 1 tablet (600 mg total) by mouth 2 (two) times daily. Take for 4 days then stop., Starting 07/02/2015, Until Discontinued, Print       Allergies  Allergen Reactions  . Ativan [Lorazepam] Swelling and Other (See Comments)    Dysarthria(patient has difficulty speaking and slurred speech); denies swelling, itching, pain, or numbness.  [name] [Ziprasidone Hcl] Itching and Swelling    Tongue swelling  . Keflex [Cephalexin] Swelling and Other (See Comments)    Tongue swelling. Can't talk   . Haldol [Haloperidol Lactate] Swelling    Tongue swelling. 05/31/15 - MD ok with giving as pt has tolerated in the past Pt can take benadryl.  . Other Itching    wool      The results of significant diagnostics from this hospitalization (including imaging, microbiology, ancillary and laboratory) are listed below for reference.    Significant Diagnostic Studies: Dg Chest 2 View  07/08/2015  CLINICAL DATA:  Left-sided chest pain, shortness of breath, productive cough. EXAM: CHEST  2 VIEW COMPARISON:  July 02, 2015.  February 07, 2015. FINDINGS: Moderate cardiomegaly is noted. No pneumothorax or pleural effusion is noted. Stable mild central pulmonary vascular congestion is noted. Stable chronic resorption of both distal clavicles is noted. IMPRESSION: Stable cardiomegaly and mild central pulmonary vascular congestion. Electronically Signed   By: Marijo Conception, M.D.   On: 07/08/2015 13:32    Microbiology: No results found for this or any previous visit (from the past 240 hour(s)).    Labs: Basic Metabolic Panel:  Recent Labs Lab 07/27/15 1044 07/30/15 0829 08/01/15 0810  NA 140 140 138  K 4.7 4.7 3.6  CL 101 100* 97*  CO2 21* 22  --   GLUCOSE 132* 85 114*  BUN 101* 86* 58*  CREATININE 13.20* 11.61* 9.90*  CALCIUM 9.7 9.6  --    Liver Function Tests: No results for input(s): AST, ALT, ALKPHOS, BILITOT, PROT, ALBUMIN in the last 168 hours. No results for input(s): LIPASE, AMYLASE in the last 168 hours. No results for input(s): AMMONIA in the last 168 hours. CBC:  Recent Labs Lab 07/27/15 1822 07/30/15 0829 08/01/15 0810  WBC 7.9 8.3  --   HGB 8.3* 8.7* 9.5*  HCT 24.8* 26.3* 28.0*  MCV 86.7 87.1  --   PLT 243 231  --    Cardiac Enzymes: No results for input(s): CKTOTAL, CKMB, CKMBINDEX, TROPONINI in the last 168 hours. BNP: BNP (last 3 results)  Recent Labs  09/11/14 0815  BNP 1694.9*    ProBNP (last 3 results) No results for input(s): PROBNP in the last 8760 hours.  CBG: No results for input(s): GLUCAP in the last 168 hours.  Signed:  Elmarie Shiley MD.  [facility] 08/01/2015, 3:53 PM

## 2015-08-01 NOTE — ED Notes (Signed)
Pt d/c instructions given and reviewed. Patient calling for a ride, ambulatory out of dept. NAD.

## 2015-08-01 NOTE — ED Notes (Signed)
Pt here, states she needs dialysis. Pt. Denies shortness of breath.

## 2015-08-01 NOTE — Discharge Instructions (Signed)
Follow up to arrange a dialysis center.  Return for shortness of breath, if you pass out or new concerns.

## 2015-08-03 ENCOUNTER — Encounter (HOSPITAL_COMMUNITY): Payer: Self-pay

## 2015-08-03 ENCOUNTER — Observation Stay (HOSPITAL_COMMUNITY)
Admission: EM | Admit: 2015-08-03 | Discharge: 2015-08-03 | Disposition: A | Discharge status: Home / Self Care | Payer: Medicare Other | Attending: Internal Medicine | Admitting: Internal Medicine

## 2015-08-03 DIAGNOSIS — Z720 Tobacco use: Secondary | ICD-10-CM | POA: Diagnosis not present

## 2015-08-03 DIAGNOSIS — E8779 Other fluid overload: Secondary | ICD-10-CM

## 2015-08-03 DIAGNOSIS — Z79899 Other long term (current) drug therapy: Secondary | ICD-10-CM | POA: Diagnosis not present

## 2015-08-03 DIAGNOSIS — F17210 Nicotine dependence, cigarettes, uncomplicated: Secondary | ICD-10-CM | POA: Diagnosis not present

## 2015-08-03 DIAGNOSIS — I132 Hypertensive heart and chronic kidney disease with heart failure and with stage 5 chronic kidney disease, or end stage renal disease: Secondary | ICD-10-CM | POA: Diagnosis not present

## 2015-08-03 DIAGNOSIS — N186 End stage renal disease: Secondary | ICD-10-CM | POA: Diagnosis not present

## 2015-08-03 DIAGNOSIS — I10 Essential (primary) hypertension: Secondary | ICD-10-CM | POA: Diagnosis not present

## 2015-08-03 DIAGNOSIS — Z992 Dependence on renal dialysis: Secondary | ICD-10-CM | POA: Diagnosis not present

## 2015-08-03 DIAGNOSIS — R0602 Shortness of breath: Secondary | ICD-10-CM

## 2015-08-03 DIAGNOSIS — E8770 Fluid overload, unspecified: Secondary | ICD-10-CM | POA: Diagnosis present

## 2015-08-03 DIAGNOSIS — F250 Schizoaffective disorder, bipolar type: Secondary | ICD-10-CM | POA: Insufficient documentation

## 2015-08-03 DIAGNOSIS — I509 Heart failure, unspecified: Secondary | ICD-10-CM | POA: Diagnosis not present

## 2015-08-03 DIAGNOSIS — D631 Anemia in chronic kidney disease: Secondary | ICD-10-CM

## 2015-08-03 LAB — I-STAT CHEM 8, ED
BUN: 89 mg/dL — AB (ref 6–20)
CREATININE: 12.8 mg/dL — AB (ref 0.44–1.00)
Calcium, Ion: 1.13 mmol/L (ref 1.12–1.23)
Chloride: 99 mmol/L — ABNORMAL LOW (ref 101–111)
GLUCOSE: 141 mg/dL — AB (ref 65–99)
HEMATOCRIT: 29 % — AB (ref 36.0–46.0)
Hemoglobin: 9.9 g/dL — ABNORMAL LOW (ref 12.0–15.0)
Potassium: 4.5 mmol/L (ref 3.5–5.1)
Sodium: 136 mmol/L (ref 135–145)
TCO2: 22 mmol/L (ref 0–100)

## 2015-08-03 LAB — CBC
HEMATOCRIT: 27.2 % — AB (ref 36.0–46.0)
Hemoglobin: 8.9 g/dL — ABNORMAL LOW (ref 12.0–15.0)
MCH: 28.7 pg (ref 26.0–34.0)
MCHC: 32.7 g/dL (ref 30.0–36.0)
MCV: 87.7 fL (ref 78.0–100.0)
PLATELETS: 237 10*3/uL (ref 150–400)
RBC: 3.1 MIL/uL — ABNORMAL LOW (ref 3.87–5.11)
RDW: 17.3 % — AB (ref 11.5–15.5)
WBC: 7.4 10*3/uL (ref 4.0–10.5)

## 2015-08-03 MED ORDER — FUROSEMIDE 80 MG PO TABS: 120.0000 mg | ORAL_TABLET | Freq: Every day | ORAL | Status: DC

## 2015-08-03 MED ORDER — PREDNISONE 20 MG PO TABS: 20.0000 mg | ORAL_TABLET | Freq: Every day | ORAL | Status: DC

## 2015-08-03 MED ORDER — TRAMADOL HCL 50 MG PO TABS
50.0000 mg | ORAL_TABLET | Freq: Four times a day (QID) | ORAL | Status: DC | PRN
  Administered 2015-08-03: 50 mg via ORAL

## 2015-08-03 MED ORDER — HEPARIN SODIUM (PORCINE) 5000 UNIT/ML IJ SOLN: 5000.0000 [IU] | Freq: Three times a day (TID) | INTRAMUSCULAR | Status: DC

## 2015-08-03 MED ORDER — HEPARIN SODIUM (PORCINE) 1000 UNIT/ML IJ SOLN
INTRAMUSCULAR | Status: AC
  Administered 2015-08-03: 1300 [IU] via INTRAVENOUS_CENTRAL
  Filled 2015-08-03: qty 4

## 2015-08-03 MED ORDER — DIPHENHYDRAMINE HCL 25 MG PO CAPS
ORAL_CAPSULE | ORAL | Status: AC
  Administered 2015-08-03: 25 mg via ORAL
  Filled 2015-08-03: qty 1

## 2015-08-03 MED ORDER — METOPROLOL TARTRATE 50 MG PO TABS: 50.0000 mg | ORAL_TABLET | Freq: Two times a day (BID) | ORAL | Status: DC

## 2015-08-03 MED ORDER — ACETAMINOPHEN 325 MG PO TABS: 650.0000 mg | ORAL_TABLET | Freq: Four times a day (QID) | ORAL | Status: DC | PRN

## 2015-08-03 MED ORDER — HEPARIN SODIUM (PORCINE) 1000 UNIT/ML DIALYSIS
20.0000 [IU]/kg | INTRAMUSCULAR | Status: DC | PRN
  Administered 2015-08-03: 1300 [IU] via INTRAVENOUS_CENTRAL

## 2015-08-03 MED ORDER — SODIUM CHLORIDE 0.9% FLUSH: 3.0000 mL | Freq: Two times a day (BID) | INTRAVENOUS | Status: DC

## 2015-08-03 MED ORDER — ACETAMINOPHEN 650 MG RE SUPP: 650.0000 mg | Freq: Four times a day (QID) | RECTAL | Status: DC | PRN

## 2015-08-03 MED ORDER — TRAMADOL HCL 50 MG PO TABS
ORAL_TABLET | ORAL | Status: AC
  Administered 2015-08-03: 50 mg via ORAL
  Filled 2015-08-03: qty 1

## 2015-08-03 MED ORDER — SODIUM CHLORIDE 0.9 % IV SOLN: 100.0000 mL | INTRAVENOUS | Status: DC | PRN

## 2015-08-03 MED ORDER — ONDANSETRON HCL 4 MG PO TABS: 4.0000 mg | ORAL_TABLET | Freq: Four times a day (QID) | ORAL | Status: DC | PRN

## 2015-08-03 MED ORDER — EPOETIN ALFA 10000 UNIT/ML IJ SOLN
INTRAMUSCULAR | Status: AC
  Administered 2015-08-03: 10000 [IU] via SUBCUTANEOUS
  Filled 2015-08-03: qty 1

## 2015-08-03 MED ORDER — LORATADINE 10 MG PO TABS: 10.0000 mg | ORAL_TABLET | Freq: Every day | ORAL | Status: DC

## 2015-08-03 MED ORDER — PENTAFLUOROPROP-TETRAFLUOROETH EX AERO
1.0000 | INHALATION_SPRAY | CUTANEOUS | Status: DC | PRN
Start: 2015-08-03 — End: 2015-08-04
  Administered 2015-08-03: 1 via TOPICAL

## 2015-08-03 MED ORDER — DOCUSATE SODIUM 100 MG PO CAPS: 100.0000 mg | ORAL_CAPSULE | Freq: Two times a day (BID) | ORAL | Status: DC

## 2015-08-03 MED ORDER — ALBUTEROL SULFATE (2.5 MG/3ML) 0.083% IN NEBU: 2.5000 mg | INHALATION_SOLUTION | RESPIRATORY_TRACT | Status: DC | PRN

## 2015-08-03 MED ORDER — DIPHENHYDRAMINE HCL 25 MG PO CAPS
25.0000 mg | ORAL_CAPSULE | Freq: Four times a day (QID) | ORAL | Status: DC | PRN
  Administered 2015-08-03: 25 mg via ORAL

## 2015-08-03 MED ORDER — PENTAFLUOROPROP-TETRAFLUOROETH EX AERO
INHALATION_SPRAY | CUTANEOUS | Status: AC
  Administered 2015-08-03: 1 via TOPICAL
  Filled 2015-08-03: qty 103.5

## 2015-08-03 MED ORDER — OXYCODONE HCL 5 MG PO TABS: 5.0000 mg | ORAL_TABLET | ORAL | Status: DC | PRN

## 2015-08-03 MED ORDER — ONDANSETRON HCL 4 MG/2ML IJ SOLN: 4.0000 mg | Freq: Four times a day (QID) | INTRAMUSCULAR | Status: DC | PRN

## 2015-08-03 MED ORDER — LIDOCAINE HCL (PF) 1 % IJ SOLN
5.0000 mL | INTRAMUSCULAR | Status: DC | PRN
  Administered 2015-08-03: 0.5 mL via INTRADERMAL

## 2015-08-03 MED ORDER — LIDOCAINE HCL (PF) 1 % IJ SOLN
INTRAMUSCULAR | Status: AC
  Administered 2015-08-03: 0.5 mL via INTRADERMAL
  Filled 2015-08-03: qty 5

## 2015-08-03 MED ORDER — EPOETIN ALFA 10000 UNIT/ML IJ SOLN
10000.0000 [IU] | INTRAMUSCULAR | Status: DC
  Administered 2015-08-03: 10000 [IU] via SUBCUTANEOUS
  Filled 2015-08-03 (×2): qty 1

## 2015-08-03 MED ORDER — LIDOCAINE-PRILOCAINE 2.5-2.5 % EX CREA: 1.0000 "application " | TOPICAL_CREAM | CUTANEOUS | Status: DC | PRN

## 2015-08-03 MED ORDER — HYDRALAZINE HCL 20 MG/ML IJ SOLN
5.0000 mg | INTRAMUSCULAR | Status: DC | PRN
  Filled 2015-08-03: qty 1
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
## 2015-08-03 NOTE — ED Notes (Signed)
Pt here for dialysis, last treatment was Monday.  Pt c/o breast tenderness.
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
## 2015-08-03 NOTE — ED Notes (Signed)
Attempted to call report, RN unavailable.

## 2015-08-03 NOTE — Progress Notes (Signed)
Patient refuses to wear telemetry monitor.

## 2015-08-04 NOTE — Discharge Summary (Signed)
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  Diet recommendation: Patient left the hospital without being examined.  Filed Weights   08/03/15 0918  Weight: 63.504 kg (140 lb)    History of present illness:  Sara [name] is a 35 y.o. female with medical history significant for end-stage renal disease on hemodialysis and schizoaffective disorder/bipolar disorder, who is well-known to the [facility] system. She presented to the ED for routine hemodialysis. She actually presented several days ago, but the nephrologist and the ED physician did not believe she needed emergent hemodialysis. She returnedOn 08/03/2015 requestinghemodialysis. She denied shortness of breath or chest pain. Shedid have swelling in her legs that have increased over the past couple of days. Of note, the patient has been barred from all other outpatient hemodialysis centers due to her ggressive behavior in the past. In the ED, she was afebrile, mildly tachycardic, and moderately hypertensive with a blood pressure of 174/124. Her heart rate had ranged from 62-114 bpm. Chest x-ray was not ordered. Her lab data  were significant for hemoglobin of 9.9, hematocrit of 29.0, BUN of 89, creatinine of 12.8, and glucose of 141 (nonfasting). She is being admitted for evaluation and management of end-stage renal disease.  Hospital Course:  Patient was admitted for observation on telemetry.  All of her chronic medications were continued. Patient was advised to stop smoking, drinking alcohol, and using illicit drugs. Nephrologist, Dr. Lowanda Foster was consulted. He arranged for hemodialysis. Patient underwent hemodialysis. After the completion of hemodialysis, she left the hospital.  Procedures:  Hemodialysis  Consultations:  Nephrology  Discharge Exam: Filed Vitals:   08/03/15 1930 08/03/15 2000  BP: 165/85 147/84  Pulse: 112 114  Temp:    Resp:     Patient not examined as she left the hospital following hemodialysis.  Discharge Instructions    Discharge Medication List as of 08/03/2015  9:20 PM    CONTINUE these medications which have NOT CHANGED   Details  acetaminophen (TYLENOL) 500 MG tablet Take 1,000 mg by mouth every 6 (six) hours as needed for moderate pain. Reported on 06/12/2015, Until Discontinued, Historical Med    furosemide (LASIX) 40 MG tablet Take 3 tablets (120 mg total) by mouth daily., Starting 07/02/2015, Until Discontinued, Print    loratadine (CLARITIN) 10 MG tablet Take 1 tablet (10 mg total) by mouth daily. Take daily., Starting 07/02/2015, Until Discontinued, Print    metoprolol (LOPRESSOR) 50 MG tablet Take 1 tablet (50 mg total) by mouth 2 (two) times daily., Starting 07/02/2015, Until Discontinued, Print    predniSONE (DELTASONE) 20 MG tablet Take 1 tablet (20 mg total) by mouth daily with breakfast., Starting 07/02/2015, Until Discontinued, No Print    clindamycin (CLINDAGEL) 1 % gel Apply topically 2 (two) times daily., Starting 07/17/2015, Until Discontinued, Print       Allergies  Allergen Reactions  . Ativan [Lorazepam] Swelling and Other (See Comments)    Dysarthria(patient has difficulty speaking and slurred speech); denies swelling, itching, pain, or  numbness.  [name] [Ziprasidone Hcl] Itching and Swelling    Tongue swelling  . Keflex [Cephalexin] Swelling and Other (See Comments)    Tongue swelling. Can't talk   . Haldol  [Haloperidol Lactate] Swelling    Tongue swelling. 05/31/15 - MD ok with giving as pt has tolerated in the past Pt can take benadryl.  . Other Itching    wool      The results of significant diagnostics from this hospitalization (including imaging, microbiology, ancillary and laboratory) are listed below for reference.    Significant Diagnostic Studies: Dg Chest 2 View  07/08/2015  CLINICAL DATA:  Left-sided chest pain, shortness of breath, productive cough. EXAM: CHEST  2 VIEW COMPARISON:  July 02, 2015.  February 07, 2015. FINDINGS: Moderate cardiomegaly is noted. No pneumothorax or pleural effusion is noted. Stable mild central pulmonary vascular congestion is noted. Stable chronic resorption of both distal clavicles is noted. IMPRESSION: Stable cardiomegaly and mild central pulmonary vascular congestion. Electronically Signed   By: Marijo Conception, M.D.   On: 07/08/2015 13:32    Microbiology: No results found for this or any previous visit (from the past 240 hour(s)).   Labs: Basic Metabolic Panel:  Recent Labs Lab 07/30/15 0829 08/01/15 0810 08/03/15 1011  NA 140 138 136  K 4.7 3.6 4.5  CL 100* 97* 99*  CO2 22  --   --   GLUCOSE 85 114* 141*  BUN 86* 58* 89*  CREATININE 11.61* 9.90* 12.80*  CALCIUM 9.6  --   --    Liver Function Tests: No results for input(s): AST, ALT, ALKPHOS, BILITOT, PROT, ALBUMIN in the last 168 hours. No results for input(s): LIPASE, AMYLASE in the last 168 hours. No results for input(s): AMMONIA in the last 168 hours. CBC:  Recent Labs Lab 07/30/15 0829 08/01/15 0810 08/03/15 1011 08/03/15 1528  WBC 8.3  --   --  7.4  HGB 8.7* 9.5* 9.9* 8.9*  HCT 26.3* 28.0* 29.0* 27.2*  MCV 87.1  --   --  87.7  PLT 231  --   --  237   Cardiac Enzymes: No results for input(s): CKTOTAL, CKMB, CKMBINDEX, TROPONINI in the last 168 hours. BNP: BNP (last 3 results)  Recent Labs  09/11/14 0815  BNP 1694.9*    ProBNP (last 3 results) No results for  input(s): PROBNP in the last 8760 hours.  CBG: No results for input(s): GLUCAP in the last 168 hours.     Signed:  Theotis Gerdeman MD.  [facility] 08/04/2015, 7:08 PM

## 2015-08-06 ENCOUNTER — Encounter (HOSPITAL_COMMUNITY): Payer: Self-pay | Admitting: Emergency Medicine

## 2015-08-06 ENCOUNTER — Emergency Department (HOSPITAL_COMMUNITY)
Admission: EM | Admit: 2015-08-06 | Discharge: 2015-08-06 | Disposition: A | Discharge status: Home / Self Care | Payer: Medicare Other | Attending: Emergency Medicine | Admitting: Emergency Medicine

## 2015-08-06 DIAGNOSIS — I120 Hypertensive chronic kidney disease with stage 5 chronic kidney disease or end stage renal disease: Secondary | ICD-10-CM | POA: Diagnosis not present

## 2015-08-06 DIAGNOSIS — Z79899 Other long term (current) drug therapy: Secondary | ICD-10-CM | POA: Insufficient documentation

## 2015-08-06 DIAGNOSIS — I132 Hypertensive heart and chronic kidney disease with heart failure and with stage 5 chronic kidney disease, or end stage renal disease: Secondary | ICD-10-CM | POA: Insufficient documentation

## 2015-08-06 DIAGNOSIS — I509 Heart failure, unspecified: Secondary | ICD-10-CM | POA: Insufficient documentation

## 2015-08-06 DIAGNOSIS — Z992 Dependence on renal dialysis: Secondary | ICD-10-CM | POA: Insufficient documentation

## 2015-08-06 DIAGNOSIS — F17210 Nicotine dependence, cigarettes, uncomplicated: Secondary | ICD-10-CM | POA: Diagnosis not present

## 2015-08-06 DIAGNOSIS — N186 End stage renal disease: Secondary | ICD-10-CM | POA: Diagnosis not present

## 2015-08-06 DIAGNOSIS — F250 Schizoaffective disorder, bipolar type: Secondary | ICD-10-CM | POA: Diagnosis not present

## 2015-08-06 DIAGNOSIS — I10 Essential (primary) hypertension: Secondary | ICD-10-CM

## 2015-08-06 LAB — BASIC METABOLIC PANEL
ANION GAP: 16 — AB (ref 5–15)
BUN: 78 mg/dL — AB (ref 6–20)
CO2: 24 mmol/L (ref 22–32)
Calcium: 9 mg/dL (ref 8.9–10.3)
Chloride: 99 mmol/L — ABNORMAL LOW (ref 101–111)
Creatinine, Ser: 11.69 mg/dL — ABNORMAL HIGH (ref 0.44–1.00)
GFR calc Af Amer: 4 mL/min — ABNORMAL LOW (ref >60)
GFR, EST NON AFRICAN AMERICAN: 4 mL/min — AB (ref >60)
Glucose, Bld: 143 mg/dL — ABNORMAL HIGH (ref 65–99)
POTASSIUM: 4.1 mmol/L (ref 3.5–5.1)
SODIUM: 139 mmol/L (ref 135–145)

## 2015-08-06 MED ORDER — HYDRALAZINE HCL 10 MG PO TABS: 10.0000 mg | ORAL_TABLET | Freq: Three times a day (TID) | ORAL | Status: DC

## 2015-08-06 MED ORDER — ACETAMINOPHEN 500 MG PO TABS
1000.0000 mg | ORAL_TABLET | Freq: Once | ORAL | Status: AC
  Administered 2015-08-06: 1000 mg via ORAL
  Filled 2015-08-06: qty 2

## 2015-08-06 MED ORDER — HYDRALAZINE HCL 10 MG PO TABS
10.0000 mg | ORAL_TABLET | Freq: Once | ORAL | Status: AC
  Administered 2015-08-06: 10 mg via ORAL
  Filled 2015-08-06: qty 1
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
## 2015-08-06 NOTE — ED Notes (Signed)
Pt here for dialysis. Pt last dialyzed on Friday. Pt has not other complaints.

## 2015-08-08 ENCOUNTER — Observation Stay (HOSPITAL_COMMUNITY)
Admission: EM | Admit: 2015-08-08 | Discharge: 2015-08-08 | Disposition: A | Discharge status: Home / Self Care | Payer: Medicare Other | Attending: Internal Medicine | Admitting: Internal Medicine

## 2015-08-08 ENCOUNTER — Encounter (HOSPITAL_COMMUNITY): Payer: Self-pay | Admitting: Emergency Medicine

## 2015-08-08 DIAGNOSIS — I509 Heart failure, unspecified: Secondary | ICD-10-CM | POA: Diagnosis not present

## 2015-08-08 DIAGNOSIS — Z792 Long term (current) use of antibiotics: Secondary | ICD-10-CM | POA: Insufficient documentation

## 2015-08-08 DIAGNOSIS — Z992 Dependence on renal dialysis: Secondary | ICD-10-CM | POA: Insufficient documentation

## 2015-08-08 DIAGNOSIS — F17210 Nicotine dependence, cigarettes, uncomplicated: Secondary | ICD-10-CM | POA: Diagnosis not present

## 2015-08-08 DIAGNOSIS — M545 Low back pain: Secondary | ICD-10-CM | POA: Diagnosis present

## 2015-08-08 DIAGNOSIS — F250 Schizoaffective disorder, bipolar type: Secondary | ICD-10-CM | POA: Insufficient documentation

## 2015-08-08 DIAGNOSIS — I10 Essential (primary) hypertension: Secondary | ICD-10-CM | POA: Diagnosis not present

## 2015-08-08 DIAGNOSIS — I132 Hypertensive heart and chronic kidney disease with heart failure and with stage 5 chronic kidney disease, or end stage renal disease: Principal | ICD-10-CM | POA: Insufficient documentation

## 2015-08-08 DIAGNOSIS — Z79899 Other long term (current) drug therapy: Secondary | ICD-10-CM | POA: Insufficient documentation

## 2015-08-08 DIAGNOSIS — Z7952 Long term (current) use of systemic steroids: Secondary | ICD-10-CM | POA: Insufficient documentation

## 2015-08-08 DIAGNOSIS — N186 End stage renal disease: Secondary | ICD-10-CM

## 2015-08-08 DIAGNOSIS — D631 Anemia in chronic kidney disease: Secondary | ICD-10-CM | POA: Diagnosis not present

## 2015-08-08 DIAGNOSIS — I120 Hypertensive chronic kidney disease with stage 5 chronic kidney disease or end stage renal disease: Secondary | ICD-10-CM | POA: Diagnosis not present

## 2015-08-08 LAB — CBC WITH DIFFERENTIAL/PLATELET
Basophils Absolute: 0 10*3/uL (ref 0.0–0.1)
Basophils Relative: 0 %
EOS PCT: 0 %
Eosinophils Absolute: 0 10*3/uL (ref 0.0–0.7)
HEMATOCRIT: 26.9 % — AB (ref 36.0–46.0)
Hemoglobin: 8.9 g/dL — ABNORMAL LOW (ref 12.0–15.0)
LYMPHS PCT: 13 %
Lymphs Abs: 1 10*3/uL (ref 0.7–4.0)
MCH: 29.1 pg (ref 26.0–34.0)
MCHC: 33.1 g/dL (ref 30.0–36.0)
MCV: 87.9 fL (ref 78.0–100.0)
MONO ABS: 0.4 10*3/uL (ref 0.1–1.0)
MONOS PCT: 6 %
NEUTROS ABS: 5.9 10*3/uL (ref 1.7–7.7)
Neutrophils Relative %: 81 %
PLATELETS: 171 10*3/uL (ref 150–400)
RBC: 3.06 MIL/uL — ABNORMAL LOW (ref 3.87–5.11)
RDW: 17.2 % — AB (ref 11.5–15.5)
WBC: 7.3 10*3/uL (ref 4.0–10.5)

## 2015-08-08 LAB — BASIC METABOLIC PANEL
ANION GAP: 18 — AB (ref 5–15)
BUN: 102 mg/dL — AB (ref 6–20)
CALCIUM: 9.6 mg/dL (ref 8.9–10.3)
CO2: 23 mmol/L (ref 22–32)
Chloride: 98 mmol/L — ABNORMAL LOW (ref 101–111)
Creatinine, Ser: 13.36 mg/dL — ABNORMAL HIGH (ref 0.44–1.00)
GFR, EST AFRICAN AMERICAN: 4 mL/min — AB (ref >60)
GFR, EST NON AFRICAN AMERICAN: 3 mL/min — AB (ref >60)
Glucose, Bld: 114 mg/dL — ABNORMAL HIGH (ref 65–99)
POTASSIUM: 4.9 mmol/L (ref 3.5–5.1)
Sodium: 139 mmol/L (ref 135–145)

## 2015-08-08 LAB — MRSA PCR SCREENING: MRSA BY PCR: NEGATIVE

## 2015-08-08 MED ORDER — ACETAMINOPHEN 500 MG PO TABS: 1000.0000 mg | ORAL_TABLET | Freq: Four times a day (QID) | ORAL | Status: DC | PRN

## 2015-08-08 MED ORDER — SODIUM CHLORIDE 0.9 % IV SOLN: 100.0000 mL | INTRAVENOUS | Status: DC | PRN

## 2015-08-08 MED ORDER — SODIUM CHLORIDE 0.9% FLUSH: 3.0000 mL | INTRAVENOUS | Status: DC | PRN

## 2015-08-08 MED ORDER — METOPROLOL TARTRATE 50 MG PO TABS: 50.0000 mg | ORAL_TABLET | Freq: Two times a day (BID) | ORAL | Status: DC

## 2015-08-08 MED ORDER — PENTAFLUOROPROP-TETRAFLUOROETH EX AERO: 1.0000 "application " | INHALATION_SPRAY | CUTANEOUS | Status: DC | PRN

## 2015-08-08 MED ORDER — HEPARIN SODIUM (PORCINE) 1000 UNIT/ML IJ SOLN
INTRAMUSCULAR | Status: AC
  Administered 2015-08-08: 1900 [IU] via INTRAVENOUS_CENTRAL
  Filled 2015-08-08: qty 4

## 2015-08-08 MED ORDER — LIDOCAINE HCL (PF) 1 % IJ SOLN
INTRAMUSCULAR | Status: AC
  Administered 2015-08-08: 0.5 mL via INTRADERMAL
  Filled 2015-08-08: qty 5

## 2015-08-08 MED ORDER — HEPARIN SODIUM (PORCINE) 1000 UNIT/ML DIALYSIS
20.0000 [IU]/kg | INTRAMUSCULAR | Status: DC | PRN
  Administered 2015-08-08: 1900 [IU] via INTRAVENOUS_CENTRAL

## 2015-08-08 MED ORDER — LIDOCAINE HCL (PF) 1 % IJ SOLN
5.0000 mL | INTRAMUSCULAR | Status: DC | PRN
  Administered 2015-08-08: 0.5 mL via INTRADERMAL

## 2015-08-08 MED ORDER — EPOETIN ALFA 10000 UNIT/ML IJ SOLN: 6000.0000 [IU] | Freq: Once | INTRAMUSCULAR | Status: DC

## 2015-08-08 MED ORDER — LORATADINE 10 MG PO TABS
10.0000 mg | ORAL_TABLET | Freq: Every day | ORAL | Status: DC
Start: 2015-08-08 — End: 2015-08-08

## 2015-08-08 MED ORDER — FUROSEMIDE 80 MG PO TABS: 120.0000 mg | ORAL_TABLET | Freq: Every day | ORAL | Status: DC

## 2015-08-08 MED ORDER — TRAMADOL HCL 50 MG PO TABS: 50.0000 mg | ORAL_TABLET | Freq: Once | ORAL | Status: DC

## 2015-08-08 MED ORDER — LIDOCAINE-PRILOCAINE 2.5-2.5 % EX CREA: 1.0000 "application " | TOPICAL_CREAM | CUTANEOUS | Status: DC | PRN

## 2015-08-08 MED ORDER — SODIUM CHLORIDE 0.9% FLUSH: 3.0000 mL | Freq: Two times a day (BID) | INTRAVENOUS | Status: DC

## 2015-08-08 MED ORDER — EPOETIN ALFA 10000 UNIT/ML IJ SOLN
INTRAMUSCULAR | Status: AC
  Administered 2015-08-08: 6000 [IU] via INTRAVENOUS
  Filled 2015-08-08: qty 1

## 2015-08-08 MED ORDER — ENOXAPARIN SODIUM 30 MG/0.3ML ~~LOC~~ SOLN: 30.0000 mg | SUBCUTANEOUS | Status: DC

## 2015-08-08 MED ORDER — HYDRALAZINE HCL 10 MG PO TABS: 10.0000 mg | ORAL_TABLET | Freq: Three times a day (TID) | ORAL | Status: DC

## 2015-08-08 MED ORDER — PREDNISONE 20 MG PO TABS: 20.0000 mg | ORAL_TABLET | Freq: Every day | ORAL | Status: DC

## 2015-08-08 MED ORDER — EPOETIN ALFA 10000 UNIT/ML IJ SOLN
6000.0000 [IU] | Freq: Once | INTRAMUSCULAR | Status: AC
  Administered 2015-08-08: 6000 [IU] via INTRAVENOUS

## 2015-08-08 MED ORDER — SODIUM CHLORIDE 0.9 % IV SOLN: 250.0000 mL | INTRAVENOUS | Status: DC | PRN

## 2015-08-08 MED ORDER — DIPHENHYDRAMINE HCL 25 MG PO CAPS: 25.0000 mg | ORAL_CAPSULE | Freq: Once | ORAL | Status: DC
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
## 2015-08-08 NOTE — Consult Note (Signed)
Sara [name] MRN: [id number] DOB/AGE: 11-May-1980 66 y.o. Primary Care Physician:No PCP Per Patient Admit date: 08/08/2015 Chief Complaint:  Chief Complaint  Patient presents with  . Back Pain   HPI: Pt is 35 year old female with past medical hx of ESRD who was admitted with c/o dyspnea," I need dialysis "  HPI dates back to few days ago started feeling short of breath,and so pt came to ER asking for her dialysis. Pt says "I  had my dialysis on Monday, I am  over my dry weight, I need my dialysis ". NO c/o chest pain No c/o fever/cough/chills NO c/o nausea/vomiting/ diarrhea No c/o hematuria Pt was seen  for need of renal replacement therapy.  Past Medical History  Diagnosis Date  . Lupus ([place])     lupus w nephritis  . History of blood transfusion     "this is probably my 3rd" (10/09/2014)  . ESRD (end stage renal disease) on dialysis [facility])     "[name]; [facility]" (10/09/2014)  . Bipolar disorder ([place])     Sara [name] 10/09/2014  . Schizophrenia ([place])     Sara [name] 10/09/2014  . Chronic anemia     Sara [name] 10/09/2014  . CHF (congestive heart failure) ([place])     systolic/notes [id number]  . Acute myopericarditis     hx/notes 10/09/2014  . Psychosis   . Hypertension   . Pregnancy   . Low back pain   . Positive ANA (antinuclear antibody) 08/16/2012  . 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 Tobacco abuse 02/20/2014  . Schizoaffective disorder, bipolar type ([place]) 11/20/2014  . Non-compliant patient         Family History  Problem Relation Age of Onset  . Drug abuse Father   . Kidney disease Father     Social History:  reports that she has been smoking Cigarettes.  She has a 2 pack-year smoking history. She uses smokeless tobacco. She reports that she drinks about 4.2 oz of alcohol per week. She reports that she uses illicit drugs (Marijuana and Cocaine).   Allergies:  Allergies  Allergen Reactions  . Ativan [Lorazepam] Swelling and Other (See Comments)     Dysarthria(patient has difficulty speaking and slurred speech); denies swelling, itching, pain, or numbness.  Sara [name] [Ziprasidone Hcl] Itching and Swelling    Tongue swelling  . Keflex [Cephalexin] Swelling and Other (See Comments)    Tongue swelling. Can't talk   . Haldol [Haloperidol Lactate] Swelling    Tongue swelling. 05/31/15 - MD ok with giving as pt has tolerated in the past Pt can take benadryl.  . Other Itching    wool     (Not in a hospital admission)     Sara [name] from the symptoms mentioned above,there are no other symptoms referable to all systems reviewed.      Physical Exam: Vital signs in last 24 hours: Temp:  [96.3 F (35.7 C)] 96.3 F (35.7 C) (05/03 0835) Pulse Rate:  [109] 109 (05/03 0835) Resp:  [18] 18 (05/03 0835) BP: (184)/(130) 184/130 mmHg (05/03 0835) SpO2:  [100 %] 100 % (05/03 0835) Weight:  [205 lb 8 oz (93.214 kg)] 205 lb 8 oz (93.214 kg) (05/03 0835) Weight change:     Intake/Output from previous day:       Physical Exam: General- pt is awake,alert, follows coomands Resp- No acute REsp distress,  decreased bs at bases. CVS- S1S2 regular in rate and rhythm, NO rubs  GIT- BS+, soft,  NT, ND EXT- 2+ LE Edema, NO Cyanosis CNS- CN 2-12 grossly intact. Moving all 4 extremities Access- AVF+, aneurysm present    Lab Results:  CBC    Component Value Date/Time   WBC 7.4 08/03/2015 1528   RBC 3.10* 08/03/2015 1528   RBC 2.77* 06/21/2015 1022   HGB 8.9* 08/03/2015 1528   HCT 27.2* 08/03/2015 1528   PLT 237 08/03/2015 1528   MCV 87.7 08/03/2015 1528   MCH 28.7 08/03/2015 1528   MCHC 32.7 08/03/2015 1528   RDW 17.3* 08/03/2015 1528   LYMPHSABS 1.3 07/08/2015 1502   MONOABS 0.6 07/08/2015 1502   EOSABS 0.1 07/08/2015 1502   BASOSABS 0.0 07/08/2015 1502      BMET  Recent Labs  08/06/15 0836 08/08/15 0841  NA 139 139  K 4.1 4.9  CL 99* 98*  CO2 24 23  GLUCOSE 143* 114*  BUN 78* 102*  CREATININE 11.69* 13.36*   CALCIUM 9.0 9.6    MICRO No results found for this or any previous visit (from the past 240 hour(s)).    Lab Results  Component Value Date   PTH 420* 05/26/2015   CALCIUM 9.6 08/08/2015   CAION 1.13 08/03/2015   PHOS 11.5* 07/23/2015      Impression: 1)Renal  ESRD on HD                Pt is not in regular  Schedule sec to her complaince/adherence issues                Will dialyze pt today  2)HTN Bp is not at goal  3)Anemia In ESRD the goal for HGb is 9--11. Pt HGb is not at goal Will keep  on epo   4)CKD Mineral-Bone Disorder PTH acceptable. Secondary Hyperparathyroidism  Present. Phosphorus not at goal.  on binders  5)Psych .  Hx of   psycosis Schizophrenia Primary MD following  6)Electrolytes  Normokalemic     NOrmonatremic   7)Acid base Co2 just  at goal Sec to no adherence to HD   Plan:  Will dialyze today Will keep  on epo Will use 2 k bath Will try to take 3 liters off      Sara [name] S 08/08/2015, 11:49 AM

## 2015-08-08 NOTE — Progress Notes (Signed)
Pt back from HD, in stable condition. Goal met. Pt ready to D/C, paperwork given, and answered questions at this time.

## 2015-08-08 NOTE — ED Notes (Signed)
Pt reports lower back pain. " I usually get this pain when i need dialysis". Pt reports is trying to get in with [facility]. nad noted.

## 2015-08-08 NOTE — ED Notes (Signed)
Patient yelling out while RN in another room When in to check on patient, patient snoring and did not answer to verbal stimuli. No distress noted.
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
## 2015-08-08 NOTE — Discharge Summary (Signed)
Physician Discharge Summary  Sara [name] [name] DOB: 01/12/1981 DOA: 08/08/2015  PCP: No PCP Per Patient  Admit date: 08/08/2015 Discharge date: 08/08/2015  Time spent: 35 minutes  Recommendations for Outpatient Follow-up:  -Will be discharged home today. -Advised to follow-up with primary care provider within 2 weeks.   Discharge Diagnoses:  Active Problems:   ESRD needing dialysis ([place])   ESRD (end stage renal disease) ([place])   Discharge Condition: Stable and improved  Filed Weights   08/08/15 0835 08/08/15 1339 08/08/15 1405  Weight: 93.214 kg (205 lb 8 oz) 94.802 kg (209 lb) 94.8 kg (208 lb 15.9 oz)    History of present illness:  Sara [name] is a 35 y.o. female with a history of end-stage renal disease on hemodialysis who is well-known to the system. She presents to the ED today for routine dialysis. She denies any chest pain or shortness of breath. She has not had any palpitations. No cough or fever. She reports recent improvement of her lower extremity edema. Patient has a history of noncompliance.  Hospital Course:   End-stage renal disease  -Admitted for the purpose of dialysis, will be discharged home after this is completed. Has had no complaints.  Procedures:  Hemodialysis   Consultations:  Nephrology  Discharge Instructions  Discharge Instructions    Care order/instruction    Complete by:  As directed   DC home after HD today     Diet - low sodium heart healthy    Complete by:  As directed      Increase activity slowly    Complete by:  As directed             Medication List    STOP taking these medications        clindamycin 1 % gel  Commonly known as:  CLINDAGEL      TAKE these medications        acetaminophen 500 MG tablet  Commonly known as:  TYLENOL  Take 1,000 mg by mouth every 6 (six) hours as needed for moderate pain. Reported on 06/12/2015     furosemide 40 MG tablet  Commonly known as:  LASIX  Take 3  tablets (120 mg total) by mouth daily.     hydrALAZINE 10 MG tablet  Commonly known as:  APRESOLINE  Take 1 tablet (10 mg total) by mouth 3 (three) times daily.     loratadine 10 MG tablet  Commonly known as:  CLARITIN  Take 1 tablet (10 mg total) by mouth daily. Take daily.     metoprolol 50 MG tablet  Commonly known as:  LOPRESSOR  Take 1 tablet (50 mg total) by mouth 2 (two) times daily.     predniSONE 20 MG tablet  Commonly known as:  DELTASONE  Take 1 tablet (20 mg total) by mouth daily with breakfast.       Allergies  Allergen Reactions  . Ativan [Lorazepam] Swelling and Other (See Comments)    Dysarthria(patient has difficulty speaking and slurred speech); denies swelling, itching, pain, or numbness.  [name] [Ziprasidone Hcl] Itching and Swelling    Tongue swelling  . Keflex [Cephalexin] Swelling and Other (See Comments)    Tongue swelling. Can't talk   . Haldol [Haloperidol Lactate] Swelling    Tongue swelling. 05/31/15 - MD ok with giving as pt has tolerated in the past Pt can take benadryl.  . Other Itching    wool      The results  of significant diagnostics from this hospitalization (including imaging, microbiology, ancillary and laboratory) are listed below for reference.    Significant Diagnostic Studies: No results found.  Microbiology: Recent Results (from the past 240 hour(s))  MRSA PCR Screening     Status: None   Collection Time: 08/08/15  1:43 PM  Result Value Ref Range Status   MRSA by PCR NEGATIVE NEGATIVE Final    Comment:        The GeneXpert MRSA Assay (FDA approved for NASAL specimens only), is one component of a comprehensive MRSA colonization surveillance program. It is not intended to diagnose MRSA infection nor to guide or monitor treatment for MRSA infections.      Labs: Basic Metabolic Panel:  Recent Labs Lab 08/03/15 1011 08/06/15 0836 08/08/15 0841  NA 136 139 139  K 4.5 4.1 4.9  CL 99* 99* 98*  CO2  --  24 23    GLUCOSE 141* 143* 114*  BUN 89* 78* 102*  CREATININE 12.80* 11.69* 13.36*  CALCIUM  --  9.0 9.6   Liver Function Tests: No results for input(s): AST, ALT, ALKPHOS, BILITOT, PROT, ALBUMIN in the last 168 hours. No results for input(s): LIPASE, AMYLASE in the last 168 hours. No results for input(s): AMMONIA in the last 168 hours. CBC:  Recent Labs Lab 08/03/15 1011 08/03/15 1528 08/08/15 1200  WBC  --  7.4 7.3  NEUTROABS  --   --  5.9  HGB 9.9* 8.9* 8.9*  HCT 29.0* 27.2* 26.9*  MCV  --  87.7 87.9  PLT  --  237 171   Cardiac Enzymes: No results for input(s): CKTOTAL, CKMB, CKMBINDEX, TROPONINI in the last 168 hours. BNP: BNP (last 3 results)  Recent Labs  09/11/14 0815  BNP 1694.9*    ProBNP (last 3 results) No results for input(s): PROBNP in the last 8760 hours.  CBG: No results for input(s): GLUCAP in the last 168 hours.     SignedLelon Frohlich  [facility] Pager: [phone] 08/08/2015, 4:20 PM

## 2015-08-10 ENCOUNTER — Encounter (HOSPITAL_COMMUNITY): Payer: Self-pay | Admitting: Emergency Medicine

## 2015-08-10 ENCOUNTER — Emergency Department (HOSPITAL_COMMUNITY)
Admission: EM | Admit: 2015-08-10 | Discharge: 2015-08-10 | Disposition: A | Discharge status: Home / Self Care | Payer: Medicare Other | Attending: Emergency Medicine | Admitting: Emergency Medicine

## 2015-08-10 DIAGNOSIS — F17210 Nicotine dependence, cigarettes, uncomplicated: Secondary | ICD-10-CM | POA: Diagnosis not present

## 2015-08-10 DIAGNOSIS — F319 Bipolar disorder, unspecified: Secondary | ICD-10-CM | POA: Diagnosis not present

## 2015-08-10 DIAGNOSIS — I509 Heart failure, unspecified: Secondary | ICD-10-CM | POA: Diagnosis not present

## 2015-08-10 DIAGNOSIS — F209 Schizophrenia, unspecified: Secondary | ICD-10-CM | POA: Insufficient documentation

## 2015-08-10 DIAGNOSIS — N186 End stage renal disease: Secondary | ICD-10-CM | POA: Diagnosis not present

## 2015-08-10 DIAGNOSIS — I132 Hypertensive heart and chronic kidney disease with heart failure and with stage 5 chronic kidney disease, or end stage renal disease: Secondary | ICD-10-CM | POA: Diagnosis not present

## 2015-08-10 DIAGNOSIS — N189 Chronic kidney disease, unspecified: Secondary | ICD-10-CM | POA: Diagnosis not present

## 2015-08-10 DIAGNOSIS — Z992 Dependence on renal dialysis: Secondary | ICD-10-CM | POA: Diagnosis present

## 2015-08-10 LAB — I-STAT CHEM 8, ED
BUN: 69 mg/dL — ABNORMAL HIGH (ref 6–20)
Calcium, Ion: 1.1 mmol/L — ABNORMAL LOW (ref 1.12–1.23)
Chloride: 97 mmol/L — ABNORMAL LOW (ref 101–111)
Creatinine, Ser: 10.9 mg/dL — ABNORMAL HIGH (ref 0.44–1.00)
Glucose, Bld: 117 mg/dL — ABNORMAL HIGH (ref 65–99)
HEMATOCRIT: 27 % — AB (ref 36.0–46.0)
HEMOGLOBIN: 9.2 g/dL — AB (ref 12.0–15.0)
Potassium: 4.7 mmol/L (ref 3.5–5.1)
SODIUM: 134 mmol/L — AB (ref 135–145)
TCO2: 25 mmol/L (ref 0–100)

## 2015-08-10 NOTE — ED Notes (Addendum)
Pt reports needs dialysis. Pt reports last dialysis on Wednesday. nad noted. Pt denies any pain. Pt reports is out of bp and fluid pills.

## 2015-08-10 NOTE — Discharge Instructions (Signed)
Chronic Kidney Disease °Chronic kidney disease occurs when the kidneys are damaged over a long period. The kidneys are two organs that lie on either side of the spine between the middle of the back and the front of the abdomen. The kidneys: °· Remove wastes and extra water from the blood. °· Produce important hormones. These help keep bones strong, regulate blood pressure, and help create red blood cells. °· Balance the fluids and chemicals in the blood and tissues. °A small amount of kidney damage may not cause problems, but a large amount of damage may make it difficult or impossible for the kidneys to work the way they should. If steps are not taken to slow down the kidney damage or stop it from getting worse, the kidneys may stop working permanently. Most of the time, chronic kidney disease does not go away. However, it can often be controlled, and those with the disease can usually live normal lives. °CAUSES °The most common causes of chronic kidney disease are diabetes and high blood pressure (hypertension). Chronic kidney disease may also be caused by: °· Diseases that cause the kidneys' filters to become inflamed. °· Diseases that affect the immune system. °· Genetic diseases. °· Medicines that damage the kidneys, such as anti-inflammatory medicines. °· Poisoning or exposure to toxic substances. °· A reoccurring kidney or urinary infection. °· A problem with urine flow. This may be caused by: °¨ Cancer. °¨ Kidney stones. °¨ An enlarged prostate in males. °SIGNS AND SYMPTOMS °Because the kidney damage in chronic kidney disease occurs slowly, symptoms develop slowly and may not be obvious until the kidney damage becomes severe. A person may have a kidney disease for years without showing any symptoms. Symptoms can include: °· Swelling (edema) of the legs, ankles, or feet. °· Tiredness (lethargy). °· Nausea or vomiting. °· Confusion. °· Problems with urination, such as: °¨ Decreased urine  production. °¨ Frequent urination, especially at night. °¨ Frequent accidents in children who are potty trained. °· Muscle twitches and cramps. °· Shortness of breath. °· Weakness. °· Persistent itchiness. °· Loss of appetite. °· Metallic taste in the mouth. °· Trouble sleeping. °· Slowed development in children. °· Short stature in children. °DIAGNOSIS °Chronic kidney disease may be detected and diagnosed by tests, including blood, urine, imaging, or kidney biopsy tests. °TREATMENT °Most chronic kidney diseases cannot be cured. Treatment usually involves relieving symptoms and preventing or slowing the progression of the disease. Treatment may include: °· A special diet. You may need to avoid alcohol and foods that are salty and high in potassium. °· Medicines. These may: °¨ Lower blood pressure. °¨ Relieve anemia. °¨ Relieve swelling. °¨ Protect the bones. °HOME CARE INSTRUCTIONS °· Follow your prescribed diet.  Your health care provider may instruct you to limit daily salt (sodium) and protein intake. °· Take medicines only as directed by your health care provider. Do not take any new medicines (prescription, over-the-counter, or nutritional supplements) unless approved by your health care provider. Many medicines can worsen your kidney damage or need to have the dose adjusted.   °· Quit smoking if you smoke. Talk to your health care provider about a smoking cessation program. °· Keep all follow-up visits as directed by your health care provider. °· Monitor your blood pressure. °· Start or continue an exercise plan. °· Get immunizations as directed by your health care provider. °· Take vitamin and mineral supplements as directed by your health care provider. °SEEK IMMEDIATE MEDICAL CARE IF: °· Your symptoms get worse or you develop   new symptoms. °· You develop symptoms of end-stage kidney disease. These include: °¨ Headaches. °¨ Abnormally dark or light skin. °¨ Numbness in the hands or feet. °¨ Easy  bruising. °¨ Frequent hiccups. °¨ Menstruation stops. °· You have a fever. °· You have decreased urine production. °· You have pain or bleeding when urinating. °MAKE SURE YOU: °· Understand these instructions. °· Will watch your condition. °· Will get help right away if you are not doing well or get worse. °FOR MORE INFORMATION  °· American Association of Kidney Patients: www.aakp.org °· National Kidney Foundation: www.kidney.org °· [employer] Kid[employer]: www.akfinc.org °· [employer] [facility]: www.lifeoptions.org and www.kidneyschool.org °  °This information is not intended to replace advice given to you by your health care provider. Make sure you discuss any questions you have with your health care provider. °  °Document Released: 01/01/2008 Document Revised: 04/14/2014 Document Reviewed: 11/21/2011 °Elsevier Interactive Patient Education ©2016 [employer] ° °
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
## 2015-08-11 ENCOUNTER — Emergency Department (HOSPITAL_COMMUNITY): Payer: Medicare Other

## 2015-08-11 ENCOUNTER — Observation Stay (HOSPITAL_COMMUNITY)
Admission: EM | Admit: 2015-08-11 | Discharge: 2015-08-11 | Disposition: A | Discharge status: Home / Self Care | Payer: Medicare Other | Attending: Internal Medicine | Admitting: Internal Medicine

## 2015-08-11 ENCOUNTER — Encounter (HOSPITAL_COMMUNITY): Payer: Self-pay | Admitting: Emergency Medicine

## 2015-08-11 DIAGNOSIS — N19 Unspecified kidney failure: Secondary | ICD-10-CM | POA: Diagnosis not present

## 2015-08-11 DIAGNOSIS — Z992 Dependence on renal dialysis: Secondary | ICD-10-CM | POA: Diagnosis not present

## 2015-08-11 DIAGNOSIS — F319 Bipolar disorder, unspecified: Secondary | ICD-10-CM | POA: Diagnosis not present

## 2015-08-11 DIAGNOSIS — F209 Schizophrenia, unspecified: Secondary | ICD-10-CM | POA: Insufficient documentation

## 2015-08-11 DIAGNOSIS — R000 Tachycardia, unspecified: Secondary | ICD-10-CM | POA: Diagnosis not present

## 2015-08-11 DIAGNOSIS — I509 Heart failure, unspecified: Secondary | ICD-10-CM | POA: Insufficient documentation

## 2015-08-11 DIAGNOSIS — I132 Hypertensive heart and chronic kidney disease with heart failure and with stage 5 chronic kidney disease, or end stage renal disease: Secondary | ICD-10-CM | POA: Diagnosis not present

## 2015-08-11 DIAGNOSIS — I10 Essential (primary) hypertension: Secondary | ICD-10-CM | POA: Diagnosis not present

## 2015-08-11 DIAGNOSIS — N186 End stage renal disease: Secondary | ICD-10-CM | POA: Diagnosis not present

## 2015-08-11 DIAGNOSIS — Z79899 Other long term (current) drug therapy: Secondary | ICD-10-CM | POA: Diagnosis not present

## 2015-08-11 DIAGNOSIS — Z452 Encounter for adjustment and management of vascular access device: Secondary | ICD-10-CM | POA: Diagnosis present

## 2015-08-11 DIAGNOSIS — J029 Acute pharyngitis, unspecified: Secondary | ICD-10-CM

## 2015-08-11 DIAGNOSIS — D631 Anemia in chronic kidney disease: Secondary | ICD-10-CM | POA: Diagnosis not present

## 2015-08-11 DIAGNOSIS — E875 Hyperkalemia: Secondary | ICD-10-CM | POA: Diagnosis not present

## 2015-08-11 DIAGNOSIS — J069 Acute upper respiratory infection, unspecified: Secondary | ICD-10-CM | POA: Diagnosis not present

## 2015-08-11 DIAGNOSIS — F17210 Nicotine dependence, cigarettes, uncomplicated: Secondary | ICD-10-CM | POA: Diagnosis not present

## 2015-08-11 DIAGNOSIS — R0602 Shortness of breath: Secondary | ICD-10-CM | POA: Diagnosis not present

## 2015-08-11 LAB — CBC WITH DIFFERENTIAL/PLATELET
BASOS ABS: 0 10*3/uL (ref 0.0–0.1)
BASOS PCT: 0 %
Eosinophils Absolute: 0.1 10*3/uL (ref 0.0–0.7)
Eosinophils Relative: 1 %
HEMATOCRIT: 28.4 % — AB (ref 36.0–46.0)
Hemoglobin: 9.3 g/dL — ABNORMAL LOW (ref 12.0–15.0)
LYMPHS PCT: 17 %
Lymphs Abs: 1.4 10*3/uL (ref 0.7–4.0)
MCH: 28.8 pg (ref 26.0–34.0)
MCHC: 32.7 g/dL (ref 30.0–36.0)
MCV: 87.9 fL (ref 78.0–100.0)
Monocytes Absolute: 0.6 10*3/uL (ref 0.1–1.0)
Monocytes Relative: 8 %
NEUTROS ABS: 6.1 10*3/uL (ref 1.7–7.7)
NEUTROS PCT: 74 %
Platelets: 187 10*3/uL (ref 150–400)
RBC: 3.23 MIL/uL — AB (ref 3.87–5.11)
RDW: 16.8 % — AB (ref 11.5–15.5)
WBC: 8.2 10*3/uL (ref 4.0–10.5)

## 2015-08-11 LAB — COMPREHENSIVE METABOLIC PANEL
ALK PHOS: 78 U/L (ref 38–126)
ALT: 14 U/L (ref 14–54)
ANION GAP: 17 — AB (ref 5–15)
AST: 14 U/L — ABNORMAL LOW (ref 15–41)
Albumin: 3.4 g/dL — ABNORMAL LOW (ref 3.5–5.0)
BILIRUBIN TOTAL: 0.7 mg/dL (ref 0.3–1.2)
BUN: 87 mg/dL — ABNORMAL HIGH (ref 6–20)
CALCIUM: 9.5 mg/dL (ref 8.9–10.3)
CO2: 23 mmol/L (ref 22–32)
Chloride: 96 mmol/L — ABNORMAL LOW (ref 101–111)
Creatinine, Ser: 11.72 mg/dL — ABNORMAL HIGH (ref 0.44–1.00)
GFR, EST AFRICAN AMERICAN: 4 mL/min — AB (ref >60)
GFR, EST NON AFRICAN AMERICAN: 4 mL/min — AB (ref >60)
GLUCOSE: 94 mg/dL (ref 65–99)
POTASSIUM: 5.2 mmol/L — AB (ref 3.5–5.1)
Sodium: 136 mmol/L (ref 135–145)
TOTAL PROTEIN: 7.2 g/dL (ref 6.5–8.1)

## 2015-08-11 LAB — URINE MICROSCOPIC-ADD ON

## 2015-08-11 LAB — URINALYSIS, ROUTINE W REFLEX MICROSCOPIC
Bilirubin Urine: NEGATIVE
GLUCOSE, UA: 100 mg/dL — AB
KETONES UR: NEGATIVE mg/dL
NITRITE: NEGATIVE
Specific Gravity, Urine: 1.015 (ref 1.005–1.030)
pH: 7.5 (ref 5.0–8.0)

## 2015-08-11 LAB — RAPID STREP SCREEN (MED CTR MEBANE ONLY): STREPTOCOCCUS, GROUP A SCREEN (DIRECT): NEGATIVE

## 2015-08-11 LAB — PREGNANCY, URINE: PREG TEST UR: NEGATIVE

## 2015-08-11 MED ORDER — EPOETIN ALFA 4000 UNIT/ML IJ SOLN
INTRAMUSCULAR | Status: AC
  Administered 2015-08-11: 4000 [IU]
  Filled 2015-08-11: qty 1

## 2015-08-11 MED ORDER — HYDRALAZINE HCL 10 MG PO TABS
10.0000 mg | ORAL_TABLET | Freq: Three times a day (TID) | ORAL | Status: DC
  Filled 2015-08-11: qty 1

## 2015-08-11 MED ORDER — TRAMADOL HCL 50 MG PO TABS: 50.0000 mg | ORAL_TABLET | Freq: Two times a day (BID) | ORAL | Status: DC

## 2015-08-11 MED ORDER — PREDNISONE 20 MG PO TABS: 20.0000 mg | ORAL_TABLET | Freq: Every day | ORAL | Status: DC

## 2015-08-11 MED ORDER — ACETAMINOPHEN 500 MG PO TABS: 500.0000 mg | ORAL_TABLET | Freq: Four times a day (QID) | ORAL | Status: DC | PRN

## 2015-08-11 MED ORDER — ENOXAPARIN SODIUM 30 MG/0.3ML ~~LOC~~ SOLN
30.0000 mg | SUBCUTANEOUS | Status: DC
  Filled 2015-08-11: qty 0.3

## 2015-08-11 MED ORDER — AZITHROMYCIN 250 MG PO TABS
500.0000 mg | ORAL_TABLET | Freq: Every day | ORAL | Status: DC
  Filled 2015-08-11: qty 2

## 2015-08-11 MED ORDER — DIPHENHYDRAMINE HCL 25 MG PO CAPS
ORAL_CAPSULE | ORAL | Status: AC
  Administered 2015-08-11: 25 mg via ORAL
  Filled 2015-08-11: qty 1

## 2015-08-11 MED ORDER — TRAMADOL HCL 50 MG PO TABS
ORAL_TABLET | ORAL | Status: AC
  Administered 2015-08-11: 50 mg via ORAL
  Filled 2015-08-11: qty 1

## 2015-08-11 MED ORDER — TRAMADOL HCL 50 MG PO TABS
50.0000 mg | ORAL_TABLET | Freq: Two times a day (BID) | ORAL | Status: DC
  Administered 2015-08-11: 50 mg via ORAL
  Filled 2015-08-11: qty 1

## 2015-08-11 MED ORDER — DIPHENHYDRAMINE HCL 25 MG PO CAPS
25.0000 mg | ORAL_CAPSULE | Freq: Four times a day (QID) | ORAL | Status: DC | PRN
  Administered 2015-08-11: 25 mg via ORAL

## 2015-08-11 MED ORDER — AZITHROMYCIN 250 MG PO TABS: 250.0000 mg | ORAL_TABLET | ORAL | Status: DC

## 2015-08-11 MED ORDER — LIDOCAINE-PRILOCAINE 2.5-2.5 % EX CREA: 1.0000 "application " | TOPICAL_CREAM | CUTANEOUS | Status: DC | PRN

## 2015-08-11 MED ORDER — SODIUM CHLORIDE 0.9 % IV SOLN: 100.0000 mL | INTRAVENOUS | Status: DC | PRN

## 2015-08-11 MED ORDER — ACETAMINOPHEN 325 MG PO TABS: 650.0000 mg | ORAL_TABLET | ORAL | Status: DC | PRN

## 2015-08-11 MED ORDER — LIDOCAINE HCL (PF) 1 % IJ SOLN
INTRAMUSCULAR | Status: AC
  Administered 2015-08-11: 0.5 mL via INTRADERMAL
  Filled 2015-08-11: qty 5

## 2015-08-11 MED ORDER — FUROSEMIDE 80 MG PO TABS
120.0000 mg | ORAL_TABLET | Freq: Every day | ORAL | Status: DC
  Filled 2015-08-11: qty 1

## 2015-08-11 MED ORDER — HEPARIN SODIUM (PORCINE) 1000 UNIT/ML IJ SOLN
INTRAMUSCULAR | Status: AC
  Administered 2015-08-11: 1400 [IU] via INTRAVENOUS_CENTRAL
  Filled 2015-08-11: qty 5

## 2015-08-11 MED ORDER — PENTAFLUOROPROP-TETRAFLUOROETH EX AERO: 1.0000 "application " | INHALATION_SPRAY | CUTANEOUS | Status: DC | PRN

## 2015-08-11 MED ORDER — LORATADINE 10 MG PO TABS: 10.0000 mg | ORAL_TABLET | Freq: Every day | ORAL | Status: DC

## 2015-08-11 MED ORDER — HEPARIN SODIUM (PORCINE) 1000 UNIT/ML DIALYSIS
20.0000 [IU]/kg | INTRAMUSCULAR | Status: DC | PRN
  Administered 2015-08-11: 1400 [IU] via INTRAVENOUS_CENTRAL
  Filled 2015-08-11 (×2): qty 2

## 2015-08-11 MED ORDER — LIDOCAINE HCL (PF) 1 % IJ SOLN
5.0000 mL | INTRAMUSCULAR | Status: DC | PRN
  Administered 2015-08-11: 0.5 mL via INTRADERMAL

## 2015-08-11 MED ORDER — METOPROLOL TARTRATE 50 MG PO TABS
50.0000 mg | ORAL_TABLET | Freq: Two times a day (BID) | ORAL | Status: DC
  Filled 2015-08-11: qty 1

## 2015-08-11 MED ORDER — ACETAMINOPHEN 500 MG PO TABS
1000.0000 mg | ORAL_TABLET | Freq: Once | ORAL | Status: AC
  Administered 2015-08-11: 1000 mg via ORAL
  Filled 2015-08-11: qty 2

## 2015-08-11 MED ORDER — METOPROLOL TARTRATE 50 MG PO TABS: 50.0000 mg | ORAL_TABLET | Freq: Two times a day (BID) | ORAL | Status: DC

## 2015-08-11 MED ORDER — AZITHROMYCIN 250 MG PO TABS: ORAL_TABLET | ORAL | Status: DC

## 2015-08-11 MED ORDER — HYDRALAZINE HCL 10 MG PO TABS: 10.0000 mg | ORAL_TABLET | Freq: Three times a day (TID) | ORAL | Status: DC

## 2015-08-11 MED ORDER — FUROSEMIDE 40 MG PO TABS: 120.0000 mg | ORAL_TABLET | Freq: Every day | ORAL | Status: DC

## 2015-08-11 NOTE — Consult Note (Signed)
BAILY KNOPS MRN: [id number] DOB/AGE: [birth year] 35 y.o. Primary Care Physician:No PCP Per Patient Admit date: 08/11/2015 Chief Complaint:  Chief Complaint  Patient presents with  . Vascular Access Problem   HPI: Pt is 35 year old female with past medical hx of ESRD who was admitted with c/o dyspnea," I need dialysis "  HPI dates back to few days ago started feeling short of breath,and so pt came to ER asking for her dialysis. Pt says " I need my dialysis ". NO c/o chest pain No c/o fever/cough/chills NO c/o nausea/vomiting/ diarrhea No c/o hematuria Pt was seen  for need of renal replacement therapy. Pt main concern to me was " [facility] have not called me back yet, they said they are busy"   Past Medical History  Diagnosis Date  . 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 History of blood transfusion     "this is probably my 3rd" (10/09/2014)  . ESRD (end stage renal disease) on dialysis [facility])     "[name]; [facility]" (10/09/2014)  . Bipolar disorder ([place])     [name] 10/09/2014  . Schizophrenia ([place])     [name] 10/09/2014  . Chronic anemia     [name] 10/09/2014  . CHF (congestive heart failure) ([place])     systolic/notes [id number]  . Acute myopericarditis     hx/notes 10/09/2014  . Psychosis   . Hypertension   . Pregnancy   . Low back pain   . Positive ANA (antinuclear antibody) 08/16/2012  . 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 Tobacco abuse 02/20/2014  . Schizoaffective disorder, bipolar type ([place]) 11/20/2014  . Non-compliant patient         Family History  Problem Relation Age of Onset  . Drug abuse Father   . Kidney disease Father     Social History:  reports that she has been smoking Cigarettes.  She has a 2 pack-year smoking history. She uses smokeless tobacco. She reports that she drinks about 4.2 oz of alcohol per week. She reports that she uses illicit drugs (Marijuana and Cocaine).   Allergies:  Allergies  Allergen Reactions   . Ativan [Lorazepam] Swelling and Other (See Comments)    Dysarthria(patient has difficulty speaking and slurred speech); denies swelling, itching, pain, or numbness.  [name] [Ziprasidone Hcl] Itching and Swelling    Tongue swelling  . Keflex [Cephalexin] Swelling and Other (See Comments)    Tongue swelling. Can't talk   . Haldol [Haloperidol Lactate] Swelling    Tongue swelling. 05/31/15 - MD ok with giving as pt has tolerated in the past Pt can take benadryl.  . Other Itching    wool    Medications Prior to Admission  Medication Sig Dispense Refill  . acetaminophen (TYLENOL) 500 MG tablet Take 1,000 mg by mouth every 6 (six) hours as needed for moderate pain. Reported on 06/12/2015    . furosemide (LASIX) 40 MG tablet Take 3 tablets (120 mg total) by mouth daily. 90 tablet 0  . hydrALAZINE (APRESOLINE) 10 MG tablet Take 1 tablet (10 mg total) by mouth 3 (three) times daily. 21 tablet 0  . loratadine (CLARITIN) 10 MG tablet Take 1 tablet (10 mg total) by mouth daily. Take daily. 20 tablet 0  . metoprolol (LOPRESSOR) 50 MG tablet Take 1 tablet (50 mg total) by mouth 2 (two) times daily. 60 tablet 0  . predniSONE (DELTASONE) 20 MG tablet Take 1 tablet (20  mg total) by mouth daily with breakfast.         [id number] from the symptoms mentioned above,there are no other symptoms referable to all systems reviewed.      Physical Exam: Vital signs in last 24 hours: Temp:  [97.8 F (36.6 C)-99.9 F (37.7 C)] 97.8 F (36.6 C) (05/06 1532) Pulse Rate:  [109-121] 118 (05/06 1532) Resp:  [16-18] 16 (05/06 1349) BP: (159-177)/(110-120) 159/117 mmHg (05/06 1532) SpO2:  [99 %-100 %] 100 % (05/06 1532) Weight:  [150 lb (68.04 kg)-213 lb 3 oz (96.7 kg)] 213 lb 3 oz (96.7 kg) (05/06 1532) Weight change:  Last BM Date: 08/11/15  Intake/Output from previous day:       Physical Exam: General- pt is awake,alert, follows coomands Resp- No acute REsp distress,  decreased bs at  bases. CVS- S1S2 regular in rate and rhythm, NO rubs  GIT- BS+, soft, NT, ND EXT- 2+ LE Edema, NO Cyanosis CNS- CN 2-12 grossly intact. Moving all 4 extremities Access- AVF+, aneurysm present  Addendum Pt was seen on HD as well Access- two needles in situ  Lab Results:  CBC    Component Value Date/Time   WBC 8.2 08/11/2015 1236   RBC 3.23* 08/11/2015 1236   RBC 2.77* 06/21/2015 1022   HGB 9.3* 08/11/2015 1236   HCT 28.4* 08/11/2015 1236   PLT 187 08/11/2015 1236   MCV 87.9 08/11/2015 1236   MCH 28.8 08/11/2015 1236   MCHC 32.7 08/11/2015 1236   RDW 16.8* 08/11/2015 1236   LYMPHSABS 1.4 08/11/2015 1236   MONOABS 0.6 08/11/2015 1236   EOSABS 0.1 08/11/2015 1236   BASOSABS 0.0 08/11/2015 1236      BMET  Recent Labs  08/10/15 0944 08/11/15 1236  NA 134* 136  K 4.7 5.2*  CL 97* 96*  CO2  --  23  GLUCOSE 117* 94  BUN 69* 87*  CREATININE 10.90* 11.72*  CALCIUM  --  9.5    MICRO Recent Results (from the past 240 hour(s))  MRSA PCR Screening     Status: None   Collection Time: 08/08/15  1:43 PM  Result Value Ref Range Status   MRSA by PCR NEGATIVE NEGATIVE Final    Comment:        The GeneXpert MRSA Assay (FDA approved for NASAL specimens only), is one component of a comprehensive MRSA colonization surveillance program. It is not intended to diagnose MRSA infection nor to guide or monitor treatment for MRSA infections.   Rapid strep screen     Status: None   Collection Time: 08/11/15 12:19 PM  Result Value Ref Range Status   Streptococcus, Group A Screen (Direct) NEGATIVE NEGATIVE Final    Comment: (NOTE) A Rapid Antigen test may result negative if the antigen level in the sample is below the detection level of this test. The FDA has not cleared this test as a stand-alone test therefore the rapid antigen negative result has reflexed to a Group A Strep culture.       Lab Results  Component Value Date   PTH 420* 05/26/2015   CALCIUM 9.5  08/11/2015   CAION 1.10* 08/10/2015   PHOS 11.5* 07/23/2015      Impression: 1)Renal  ESRD on HD                Pt is not in regular  Schedule sec to her complaince/adherence issues                Will dialyze  pt today  2)HTN Bp is not at goal  3)Anemia In ESRD the goal for HGb is 9--11. Pt HGb is at goal Will keep  on epo   4)CKD Mineral-Bone Disorder PTH acceptable. Secondary Hyperparathyroidism  Present. Phosphorus not at goal.  on binders  5)Psych .  Hx of   psycosis Schizophrenia Primary MD following  6)Electrolytes  Hyperkalemic     Sec to non adherence to dialysis .  NOrmonatremic   7)Acid base Co2 just  at goal Sec to no adherence to HD   Plan:  Will dialyze today Will keep  on epo Will use 2 k bath Will try to take 3 liters off  I educated pt at length to get Primary care and regular Hd center as outpt.    BHUTANI,MANPREET S 08/11/2015, 4:12 PM
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
## 2015-08-11 NOTE — Progress Notes (Addendum)
Patient returned from dialysis in NAD, requested [place] paperwork for discharge. Discussed benefits and risks concerned if the patient left against medical advice, patient verbalized understanding and decided to be discharged. Found that paper actually had discharge orders, did not have to sign out AMA.

## 2015-08-11 NOTE — ED Notes (Signed)
Pt states she is here for dialysis.  States she came yesterday and they told her she did not need it.  Last treatment Wednesday.

## 2015-08-11 NOTE — Discharge Summary (Signed)
Physician Discharge Summary  Sara [name] [id number] DOB: Dec 23, 1980 DOA: 08/11/2015  PCP: No PCP Per Patient  Admit date: 08/11/2015 Discharge date: 08/11/2015  Time spent: 15 minutes  Recommendations for Outpatient Follow-up:  1. Follow up with regular dialysis center   Discharge Diagnoses:  Active Problems:   Renal failure   ESRD (end stage renal disease) ([place]) Upper respiratory tract infection  Discharge Condition: stable  Diet recommendation: low salt  Filed Weights   08/11/15 1110 08/11/15 1532 08/11/15 1545  Weight: 68.04 kg (150 lb) 96.7 kg (213 lb 3 oz) 96.7 kg (213 lb 3 oz)    History of present illness:  Sara [name] is a 35 y.o. female with a history of end-stage renal disease on hemodialysis, presents with a hospital for regularly scheduled dialysis. She is noted to be hyperkalemic at this time. She also complains of sore throat and feeling feverish that began earlier this morning. She was evaluated by emergency room physician and felt to have pharyngeal exudates. Rapid strep test was found to be negative. She will be admitted for dialysis.  Hospital Course:  Patient was admitted to the hospital to undergo dialysis. She tolerated a full treatment. She did not have any symptoms including chest pain, shortness of breath, palpitations or dizziness. She had complained of some sore throat which is felt to be an upper respiratory tract infection. She'll be given a prescription for azithromycin. Blood pressure has been elevated, she is tachycardic, but she admits to not taking her antihypertensives prior to admission. She'll receive these well in the hospital. She'll also receive prescriptions for her antihypertensives. She wishes to discharge home. She is otherwise stable.  Procedures:  HD  Consultations:  nephrology  Discharge Exam: Filed Vitals:   08/11/15 1900 08/11/15 1930  BP: 124/85 181/87  Pulse: 122 102  Temp:    Resp:      General:  NAD Cardiovascular: S1, S2 tachycardic Respiratory: cta b  Discharge Instructions   Discharge Instructions    Diet - low sodium heart healthy    Complete by:  As directed      Increase activity slowly    Complete by:  As directed           Current Discharge Medication List    START taking these medications   Details  azithromycin (ZITHROMAX) 250 MG tablet Take 500mg  po daily for 1 day then 250mg  po daily until bottle is finished Qty: 6 each, Refills: 0      CONTINUE these medications which have CHANGED   Details  furosemide (LASIX) 40 MG tablet Take 3 tablets (120 mg total) by mouth daily. Qty: 90 tablet, Refills: 0    hydrALAZINE (APRESOLINE) 10 MG tablet Take 1 tablet (10 mg total) by mouth 3 (three) times daily. Qty: 21 tablet, Refills: 0    metoprolol (LOPRESSOR) 50 MG tablet Take 1 tablet (50 mg total) by mouth 2 (two) times daily. Qty: 60 tablet, Refills: 0      CONTINUE these medications which have NOT CHANGED   Details  acetaminophen (TYLENOL) 500 MG tablet Take 1,000 mg by mouth every 6 (six) hours as needed for moderate pain. Reported on 06/12/2015    loratadine (CLARITIN) 10 MG tablet Take 1 tablet (10 mg total) by mouth daily. Take daily. Qty: 20 tablet, Refills: 0    predniSONE (DELTASONE) 20 MG tablet Take 1 tablet (20 mg total) by mouth daily with breakfast.       Allergies  Allergen Reactions  .  Ativan [Lorazepam] Swelling and Other (See Comments)    Dysarthria(patient has difficulty speaking and slurred speech); denies swelling, itching, pain, or numbness.  [name] [Ziprasidone Hcl] Itching and Swelling    Tongue swelling  . Keflex [Cephalexin] Swelling and Other (See Comments)    Tongue swelling. Can't talk   . Haldol [Haloperidol Lactate] Swelling    Tongue swelling. 05/31/15 - MD ok with giving as pt has tolerated in the past Pt can take benadryl.  . Other Itching    wool      The results of significant diagnostics from this  hospitalization (including imaging, microbiology, ancillary and laboratory) are listed below for reference.    Significant Diagnostic Studies: Dg Chest 2 View  08/11/2015  CLINICAL DATA:  Shortness of breath EXAM: CHEST  2 VIEW COMPARISON:  07/08/2015 FINDINGS: Chronic cardiopericardial enlargement and aortic tortuosity. Trace effusions and congested appearing vessels. No overt edema. Negative for pneumonia. IMPRESSION: Cardiomegaly, venous congestion, and trace effusions. Electronically Signed   By: Monte Fantasia M.D.   On: 08/11/2015 12:52    Microbiology: Recent Results (from the past 240 hour(s))  MRSA PCR Screening     Status: None   Collection Time: 08/08/15  1:43 PM  Result Value Ref Range Status   MRSA by PCR NEGATIVE NEGATIVE Final    Comment:        The GeneXpert MRSA Assay (FDA approved for NASAL specimens only), is one component of a comprehensive MRSA colonization surveillance program. It is not intended to diagnose MRSA infection nor to guide or monitor treatment for MRSA infections.   Rapid strep screen     Status: None   Collection Time: 08/11/15 12:19 PM  Result Value Ref Range Status   Streptococcus, Group A Screen (Direct) NEGATIVE NEGATIVE Final    Comment: (NOTE) A Rapid Antigen test may result negative if the antigen level in the sample is below the detection level of this test. The FDA has not cleared this test as a stand-alone test therefore the rapid antigen negative result has reflexed to a Group A Strep culture.      Labs: Basic Metabolic Panel:  Recent Labs Lab 08/06/15 0836 08/08/15 0841 08/10/15 0944 08/11/15 1236  NA 139 139 134* 136  K 4.1 4.9 4.7 5.2*  CL 99* 98* 97* 96*  CO2 24 23  --  23  GLUCOSE 143* 114* 117* 94  BUN 78* 102* 69* 87*  CREATININE 11.69* 13.36* 10.90* 11.72*  CALCIUM 9.0 9.6  --  9.5   Liver Function Tests:  Recent Labs Lab 08/11/15 1236  AST 14*  ALT 14  ALKPHOS 78  BILITOT 0.7  PROT 7.2  ALBUMIN  3.4*   No results for input(s): LIPASE, AMYLASE in the last 168 hours. No results for input(s): AMMONIA in the last 168 hours. CBC:  Recent Labs Lab 08/08/15 1200 08/10/15 0944 08/11/15 1236  WBC 7.3  --  8.2  NEUTROABS 5.9  --  6.1  HGB 8.9* 9.2* 9.3*  HCT 26.9* 27.0* 28.4*  MCV 87.9  --  87.9  PLT 171  --  187   Cardiac Enzymes: No results for input(s): CKTOTAL, CKMB, CKMBINDEX, TROPONINI in the last 168 hours. BNP: BNP (last 3 results)  Recent Labs  09/11/14 0815  BNP 1694.9*    ProBNP (last 3 results) No results for input(s): PROBNP in the last 8760 hours.  CBG: No results for input(s): GLUCAP in the last 168 hours.     Signed:  Kathie Dike  MD.  [facility] 08/11/2015, 7:50 PM

## 2015-08-13 ENCOUNTER — Emergency Department (HOSPITAL_COMMUNITY)
Admission: EM | Admit: 2015-08-13 | Discharge: 2015-08-13 | Disposition: A | Discharge status: Home / Self Care | Payer: Medicare Other | Attending: Emergency Medicine | Admitting: Emergency Medicine

## 2015-08-13 ENCOUNTER — Encounter (HOSPITAL_COMMUNITY): Payer: Self-pay | Admitting: Emergency Medicine

## 2015-08-13 DIAGNOSIS — I110 Hypertensive heart disease with heart failure: Secondary | ICD-10-CM | POA: Insufficient documentation

## 2015-08-13 DIAGNOSIS — Z79899 Other long term (current) drug therapy: Secondary | ICD-10-CM | POA: Insufficient documentation

## 2015-08-13 DIAGNOSIS — F209 Schizophrenia, unspecified: Secondary | ICD-10-CM | POA: Insufficient documentation

## 2015-08-13 DIAGNOSIS — I509 Heart failure, unspecified: Secondary | ICD-10-CM | POA: Insufficient documentation

## 2015-08-13 DIAGNOSIS — Z992 Dependence on renal dialysis: Secondary | ICD-10-CM | POA: Insufficient documentation

## 2015-08-13 DIAGNOSIS — F319 Bipolar disorder, unspecified: Secondary | ICD-10-CM | POA: Diagnosis not present

## 2015-08-13 DIAGNOSIS — F17210 Nicotine dependence, cigarettes, uncomplicated: Secondary | ICD-10-CM | POA: Diagnosis not present

## 2015-08-13 DIAGNOSIS — Z4931 Encounter for adequacy testing for hemodialysis: Secondary | ICD-10-CM | POA: Insufficient documentation

## 2015-08-13 DIAGNOSIS — N186 End stage renal disease: Secondary | ICD-10-CM | POA: Diagnosis not present

## 2015-08-13 DIAGNOSIS — Z139 Encounter for screening, unspecified: Secondary | ICD-10-CM | POA: Diagnosis not present

## 2015-08-13 LAB — BASIC METABOLIC PANEL
ANION GAP: 17 — AB (ref 5–15)
BUN: 72 mg/dL — ABNORMAL HIGH (ref 6–20)
CHLORIDE: 96 mmol/L — AB (ref 101–111)
CO2: 23 mmol/L (ref 22–32)
Calcium: 9.1 mg/dL (ref 8.9–10.3)
Creatinine, Ser: 10.3 mg/dL — ABNORMAL HIGH (ref 0.44–1.00)
GFR calc Af Amer: 5 mL/min — ABNORMAL LOW (ref >60)
GFR calc non Af Amer: 4 mL/min — ABNORMAL LOW (ref >60)
GLUCOSE: 100 mg/dL — AB (ref 65–99)
POTASSIUM: 4.5 mmol/L (ref 3.5–5.1)
Sodium: 136 mmol/L (ref 135–145)

## 2015-08-13 NOTE — Discharge Instructions (Signed)
Take your usual prescriptions as previously directed.  Call your regular Renal doctor today to schedule a follow up appointment within the next 1 to 2 days.  Return to the Emergency Department immediately sooner if worsening.

## 2015-08-13 NOTE — ED Notes (Signed)
MD at the bedside  

## 2015-08-13 NOTE — ED Notes (Signed)
Patient given discharge instruction, verbalized understand.  Pt wanting cup of ice, given, Patient ambulatory out of the department.

## 2015-08-13 NOTE — ED Notes (Signed)
PT stated she had dialysis on 08/11/15 for full 4 hour treatment. PT requesting dialysis to be done today.
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
## 2015-08-14 LAB — CULTURE, GROUP A STREP (THRC)

## 2015-08-15 ENCOUNTER — Encounter (HOSPITAL_COMMUNITY): Payer: Self-pay

## 2015-08-15 ENCOUNTER — Emergency Department (HOSPITAL_COMMUNITY)
Admission: EM | Admit: 2015-08-15 | Discharge: 2015-08-15 | Disposition: A | Discharge status: Home / Self Care | Payer: Medicare Other | Attending: Emergency Medicine | Admitting: Emergency Medicine

## 2015-08-15 DIAGNOSIS — I132 Hypertensive heart and chronic kidney disease with heart failure and with stage 5 chronic kidney disease, or end stage renal disease: Secondary | ICD-10-CM | POA: Insufficient documentation

## 2015-08-15 DIAGNOSIS — F250 Schizoaffective disorder, bipolar type: Secondary | ICD-10-CM | POA: Insufficient documentation

## 2015-08-15 DIAGNOSIS — Z992 Dependence on renal dialysis: Secondary | ICD-10-CM | POA: Diagnosis not present

## 2015-08-15 DIAGNOSIS — I509 Heart failure, unspecified: Secondary | ICD-10-CM | POA: Diagnosis not present

## 2015-08-15 DIAGNOSIS — N186 End stage renal disease: Secondary | ICD-10-CM | POA: Diagnosis not present

## 2015-08-15 DIAGNOSIS — F17210 Nicotine dependence, cigarettes, uncomplicated: Secondary | ICD-10-CM | POA: Diagnosis not present

## 2015-08-15 DIAGNOSIS — Z79899 Other long term (current) drug therapy: Secondary | ICD-10-CM | POA: Diagnosis not present

## 2015-08-15 DIAGNOSIS — F319 Bipolar disorder, unspecified: Secondary | ICD-10-CM | POA: Insufficient documentation

## 2015-08-15 DIAGNOSIS — I120 Hypertensive chronic kidney disease with stage 5 chronic kidney disease or end stage renal disease: Secondary | ICD-10-CM | POA: Diagnosis not present

## 2015-08-15 LAB — I-STAT CHEM 8, ED
BUN: 89 mg/dL — ABNORMAL HIGH (ref 6–20)
CREATININE: 12.8 mg/dL — AB (ref 0.44–1.00)
Calcium, Ion: 1.12 mmol/L (ref 1.12–1.23)
Chloride: 100 mmol/L — ABNORMAL LOW (ref 101–111)
Glucose, Bld: 92 mg/dL (ref 65–99)
HEMATOCRIT: 31 % — AB (ref 36.0–46.0)
HEMOGLOBIN: 10.5 g/dL — AB (ref 12.0–15.0)
POTASSIUM: 4.3 mmol/L (ref 3.5–5.1)
Sodium: 138 mmol/L (ref 135–145)
TCO2: 23 mmol/L (ref 0–100)
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
## 2015-08-15 NOTE — Discharge Instructions (Signed)
If you were given medicines take as directed.  If you are on coumadin or contraceptives realize their levels and effectiveness is altered by many different medicines.  If you have any reaction (rash, tongues swelling, other) to the medicines stop taking and see a physician.    If your blood pressure was elevated in the ER make sure you follow up for management with a primary doctor or return for chest pain, shortness of breath or stroke symptoms.  Please follow up as directed and return to the ER or see a physician for new or worsening symptoms.  Thank you. Filed Vitals:   08/15/15 0822 08/15/15 0823  BP:  161/106  Pulse:  102  Temp:  97.5 F (36.4 C)  TempSrc:  Oral  Resp:  20  Height: 5\' 7"  (1.702 m)   Weight: 204 lb (92.534 kg)   SpO2:  98%

## 2015-08-15 NOTE — ED Notes (Signed)
In to discharge pt. Pt not happy she is being discharged states " I need my blood cleaned. " pt made aware her lab work does not indicate she needs to be dialyzed.

## 2015-08-15 NOTE — ED Notes (Signed)
Pt here for evaluation to see if she needs dialysis

## 2015-08-17 ENCOUNTER — Observation Stay (HOSPITAL_COMMUNITY)
Admission: EM | Admit: 2015-08-17 | Discharge: 2015-08-18 | Disposition: A | Discharge status: Home / Self Care | Payer: Medicare Other | Attending: Internal Medicine | Admitting: Internal Medicine

## 2015-08-17 ENCOUNTER — Encounter (HOSPITAL_COMMUNITY): Payer: Self-pay | Admitting: Emergency Medicine

## 2015-08-17 DIAGNOSIS — F319 Bipolar disorder, unspecified: Secondary | ICD-10-CM | POA: Diagnosis not present

## 2015-08-17 DIAGNOSIS — F209 Schizophrenia, unspecified: Secondary | ICD-10-CM | POA: Diagnosis not present

## 2015-08-17 DIAGNOSIS — F17210 Nicotine dependence, cigarettes, uncomplicated: Secondary | ICD-10-CM | POA: Diagnosis not present

## 2015-08-17 DIAGNOSIS — F250 Schizoaffective disorder, bipolar type: Secondary | ICD-10-CM | POA: Diagnosis not present

## 2015-08-17 DIAGNOSIS — I509 Heart failure, unspecified: Secondary | ICD-10-CM | POA: Diagnosis not present

## 2015-08-17 DIAGNOSIS — I132 Hypertensive heart and chronic kidney disease with heart failure and with stage 5 chronic kidney disease, or end stage renal disease: Secondary | ICD-10-CM | POA: Diagnosis not present

## 2015-08-17 DIAGNOSIS — R0602 Shortness of breath: Secondary | ICD-10-CM | POA: Diagnosis not present

## 2015-08-17 DIAGNOSIS — N186 End stage renal disease: Secondary | ICD-10-CM | POA: Diagnosis present

## 2015-08-17 DIAGNOSIS — Z992 Dependence on renal dialysis: Secondary | ICD-10-CM | POA: Diagnosis not present

## 2015-08-17 DIAGNOSIS — N19 Unspecified kidney failure: Secondary | ICD-10-CM

## 2015-08-17 DIAGNOSIS — M3214 Glomerular disease in systemic lupus erythematosus: Secondary | ICD-10-CM | POA: Diagnosis not present

## 2015-08-17 DIAGNOSIS — Z79899 Other long term (current) drug therapy: Secondary | ICD-10-CM | POA: Diagnosis not present

## 2015-08-17 DIAGNOSIS — E8770 Fluid overload, unspecified: Secondary | ICD-10-CM | POA: Diagnosis not present

## 2015-08-17 DIAGNOSIS — N179 Acute kidney failure, unspecified: Secondary | ICD-10-CM | POA: Diagnosis not present

## 2015-08-17 DIAGNOSIS — I5022 Chronic systolic (congestive) heart failure: Secondary | ICD-10-CM | POA: Diagnosis present

## 2015-08-17 DIAGNOSIS — D631 Anemia in chronic kidney disease: Secondary | ICD-10-CM | POA: Diagnosis not present

## 2015-08-17 LAB — CBC WITH DIFFERENTIAL/PLATELET
BASOS PCT: 0 %
Basophils Absolute: 0 10*3/uL (ref 0.0–0.1)
EOS ABS: 0.1 10*3/uL (ref 0.0–0.7)
EOS PCT: 1 %
HCT: 27.9 % — ABNORMAL LOW (ref 36.0–46.0)
Hemoglobin: 9.1 g/dL — ABNORMAL LOW (ref 12.0–15.0)
LYMPHS ABS: 1 10*3/uL (ref 0.7–4.0)
Lymphocytes Relative: 12 %
MCH: 28.8 pg (ref 26.0–34.0)
MCHC: 32.6 g/dL (ref 30.0–36.0)
MCV: 88.3 fL (ref 78.0–100.0)
MONOS PCT: 9 %
Monocytes Absolute: 0.7 10*3/uL (ref 0.1–1.0)
Neutro Abs: 6.4 10*3/uL (ref 1.7–7.7)
Neutrophils Relative %: 78 %
PLATELETS: 220 10*3/uL (ref 150–400)
RBC: 3.16 MIL/uL — ABNORMAL LOW (ref 3.87–5.11)
RDW: 16.9 % — ABNORMAL HIGH (ref 11.5–15.5)
WBC: 8.2 10*3/uL (ref 4.0–10.5)

## 2015-08-17 LAB — BASIC METABOLIC PANEL
Anion gap: 14 (ref 5–15)
BUN: 118 mg/dL — AB (ref 6–20)
CALCIUM: 8.5 mg/dL — AB (ref 8.9–10.3)
CO2: 20 mmol/L — ABNORMAL LOW (ref 22–32)
CREATININE: 15.15 mg/dL — AB (ref 0.44–1.00)
Chloride: 99 mmol/L — ABNORMAL LOW (ref 101–111)
GFR calc non Af Amer: 3 mL/min — ABNORMAL LOW (ref >60)
GFR, EST AFRICAN AMERICAN: 3 mL/min — AB (ref >60)
Glucose, Bld: 101 mg/dL — ABNORMAL HIGH (ref 65–99)
Potassium: 4.8 mmol/L (ref 3.5–5.1)
SODIUM: 133 mmol/L — AB (ref 135–145)

## 2015-08-17 LAB — RENAL FUNCTION PANEL
ANION GAP: 16 — AB (ref 5–15)
Albumin: 3.2 g/dL — ABNORMAL LOW (ref 3.5–5.0)
BUN: 117 mg/dL — ABNORMAL HIGH (ref 6–20)
CALCIUM: 8.8 mg/dL — AB (ref 8.9–10.3)
CO2: 19 mmol/L — AB (ref 22–32)
CREATININE: 16.02 mg/dL — AB (ref 0.44–1.00)
Chloride: 99 mmol/L — ABNORMAL LOW (ref 101–111)
GFR calc Af Amer: 3 mL/min — ABNORMAL LOW (ref >60)
GFR calc non Af Amer: 3 mL/min — ABNORMAL LOW (ref >60)
GLUCOSE: 135 mg/dL — AB (ref 65–99)
Phosphorus: 12 mg/dL — ABNORMAL HIGH (ref 2.5–4.6)
Potassium: 5.4 mmol/L — ABNORMAL HIGH (ref 3.5–5.1)
SODIUM: 134 mmol/L — AB (ref 135–145)

## 2015-08-17 LAB — CBC
HCT: 26.7 % — ABNORMAL LOW (ref 36.0–46.0)
HEMOGLOBIN: 8.8 g/dL — AB (ref 12.0–15.0)
MCH: 29.1 pg (ref 26.0–34.0)
MCHC: 33 g/dL (ref 30.0–36.0)
MCV: 88.4 fL (ref 78.0–100.0)
Platelets: 164 10*3/uL (ref 150–400)
RBC: 3.02 MIL/uL — ABNORMAL LOW (ref 3.87–5.11)
RDW: 16.8 % — ABNORMAL HIGH (ref 11.5–15.5)
WBC: 6.3 10*3/uL (ref 4.0–10.5)

## 2015-08-17 MED ORDER — ALTEPLASE 2 MG IJ SOLR: 2.0000 mg | Freq: Once | INTRAMUSCULAR | Status: DC | PRN

## 2015-08-17 MED ORDER — DIPHENHYDRAMINE HCL 25 MG PO CAPS
25.0000 mg | ORAL_CAPSULE | Freq: Four times a day (QID) | ORAL | Status: AC | PRN
  Administered 2015-08-18: 25 mg via ORAL

## 2015-08-17 MED ORDER — HEPARIN SODIUM (PORCINE) 1000 UNIT/ML DIALYSIS
20.0000 [IU]/kg | INTRAMUSCULAR | Status: DC | PRN
  Administered 2015-08-18: 1900 [IU] via INTRAVENOUS_CENTRAL

## 2015-08-17 MED ORDER — FUROSEMIDE 80 MG PO TABS
120.0000 mg | ORAL_TABLET | Freq: Every day | ORAL | Status: DC
  Filled 2015-08-17: qty 1

## 2015-08-17 MED ORDER — LIDOCAINE HCL (PF) 1 % IJ SOLN
5.0000 mL | INTRAMUSCULAR | Status: DC | PRN
  Administered 2015-08-18: 0.5 mL via INTRADERMAL

## 2015-08-17 MED ORDER — PENTAFLUOROPROP-TETRAFLUOROETH EX AERO: 1.0000 "application " | INHALATION_SPRAY | CUTANEOUS | Status: DC | PRN

## 2015-08-17 MED ORDER — SODIUM CHLORIDE 0.9 % IV SOLN: 100.0000 mL | INTRAVENOUS | Status: DC | PRN

## 2015-08-17 MED ORDER — HEPARIN SODIUM (PORCINE) 1000 UNIT/ML DIALYSIS: 1000.0000 [IU] | INTRAMUSCULAR | Status: DC | PRN

## 2015-08-17 MED ORDER — LIDOCAINE-PRILOCAINE 2.5-2.5 % EX CREA: 1.0000 "application " | TOPICAL_CREAM | CUTANEOUS | Status: DC | PRN

## 2015-08-17 MED ORDER — METOPROLOL TARTRATE 50 MG PO TABS
50.0000 mg | ORAL_TABLET | Freq: Two times a day (BID) | ORAL | Status: DC
  Administered 2015-08-17: 50 mg via ORAL
  Filled 2015-08-17: qty 1

## 2015-08-17 MED ORDER — PREDNISONE 20 MG PO TABS: 20.0000 mg | ORAL_TABLET | Freq: Every day | ORAL | Status: DC

## 2015-08-17 MED ORDER — ACETAMINOPHEN 500 MG PO TABS
1000.0000 mg | ORAL_TABLET | Freq: Four times a day (QID) | ORAL | Status: DC | PRN
  Administered 2015-08-17: 1000 mg via ORAL
  Filled 2015-08-17: qty 2

## 2015-08-17 MED ORDER — HYDRALAZINE HCL 10 MG PO TABS
10.0000 mg | ORAL_TABLET | Freq: Three times a day (TID) | ORAL | Status: DC
  Administered 2015-08-17 (×2): 10 mg via ORAL
  Filled 2015-08-17 (×2): qty 1

## 2015-08-17 MED ORDER — LORATADINE 10 MG PO TABS
10.0000 mg | ORAL_TABLET | Freq: Every day | ORAL | Status: DC
  Administered 2015-08-17: 10 mg via ORAL
  Filled 2015-08-17: qty 1

## 2015-08-17 NOTE — ED Notes (Signed)
Pt refusing IV at this time.

## 2015-08-17 NOTE — ED Notes (Signed)
Report given to receiving RN, vitals being updated. Pt ready for transfer.

## 2015-08-17 NOTE — ED Notes (Signed)
Bed assignment 303, attempted to call report. RN will call back.

## 2015-08-17 NOTE — ED Notes (Signed)
Pt states she last had dialysis Saturday and needs tx.  C/o low back pain and diarrhea x4.
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
## 2015-08-17 NOTE — ED Notes (Signed)
Pt up and ambulatory to bathroom. Requesting lunch tray.

## 2015-08-17 NOTE — ED Notes (Signed)
Bed assignment changed 
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
## 2015-08-18 DIAGNOSIS — E8770 Fluid overload, unspecified: Secondary | ICD-10-CM | POA: Diagnosis not present

## 2015-08-18 DIAGNOSIS — F250 Schizoaffective disorder, bipolar type: Secondary | ICD-10-CM | POA: Diagnosis not present

## 2015-08-18 DIAGNOSIS — N186 End stage renal disease: Secondary | ICD-10-CM | POA: Diagnosis not present

## 2015-08-18 DIAGNOSIS — I132 Hypertensive heart and chronic kidney disease with heart failure and with stage 5 chronic kidney disease, or end stage renal disease: Secondary | ICD-10-CM | POA: Diagnosis not present

## 2015-08-18 DIAGNOSIS — D631 Anemia in chronic kidney disease: Secondary | ICD-10-CM | POA: Diagnosis not present

## 2015-08-18 DIAGNOSIS — Z992 Dependence on renal dialysis: Secondary | ICD-10-CM | POA: Diagnosis not present

## 2015-08-18 DIAGNOSIS — M3214 Glomerular disease in systemic lupus erythematosus: Secondary | ICD-10-CM | POA: Diagnosis not present

## 2015-08-18 LAB — BASIC METABOLIC PANEL
Anion gap: 16 — ABNORMAL HIGH (ref 5–15)
BUN: 128 mg/dL — ABNORMAL HIGH (ref 6–20)
CHLORIDE: 99 mmol/L — AB (ref 101–111)
CO2: 19 mmol/L — ABNORMAL LOW (ref 22–32)
CREATININE: 15.91 mg/dL — AB (ref 0.44–1.00)
Calcium: 9 mg/dL (ref 8.9–10.3)
GFR, EST AFRICAN AMERICAN: 3 mL/min — AB (ref >60)
GFR, EST NON AFRICAN AMERICAN: 3 mL/min — AB (ref >60)
Glucose, Bld: 86 mg/dL (ref 65–99)
POTASSIUM: 5.6 mmol/L — AB (ref 3.5–5.1)
SODIUM: 134 mmol/L — AB (ref 135–145)

## 2015-08-18 MED ORDER — TRAMADOL HCL 50 MG PO TABS
50.0000 mg | ORAL_TABLET | Freq: Two times a day (BID) | ORAL | Status: DC | PRN
  Administered 2015-08-18: 50 mg via ORAL

## 2015-08-18 MED ORDER — EPOETIN ALFA 10000 UNIT/ML IJ SOLN
10000.0000 [IU] | INTRAMUSCULAR | Status: DC
  Administered 2015-08-18: 10000 [IU] via SUBCUTANEOUS

## 2015-08-18 MED ORDER — DIPHENHYDRAMINE HCL 25 MG PO CAPS
ORAL_CAPSULE | ORAL | Status: AC
  Administered 2015-08-18: 25 mg via ORAL
  Filled 2015-08-18: qty 1

## 2015-08-18 MED ORDER — HEPARIN SODIUM (PORCINE) 1000 UNIT/ML IJ SOLN
INTRAMUSCULAR | Status: AC
  Administered 2015-08-18: 1900 [IU] via INTRAVENOUS_CENTRAL
  Filled 2015-08-18: qty 6

## 2015-08-18 MED ORDER — EPOETIN ALFA 10000 UNIT/ML IJ SOLN
INTRAMUSCULAR | Status: AC
  Administered 2015-08-18: 10000 [IU] via SUBCUTANEOUS
  Filled 2015-08-18: qty 1

## 2015-08-18 MED ORDER — SEVELAMER CARBONATE 800 MG PO TABS: 3200.0000 mg | ORAL_TABLET | Freq: Three times a day (TID) | ORAL | Status: DC

## 2015-08-18 MED ORDER — TRAMADOL HCL 50 MG PO TABS
ORAL_TABLET | ORAL | Status: AC
  Administered 2015-08-18: 50 mg via ORAL
  Filled 2015-08-18: qty 1

## 2015-08-18 NOTE — Progress Notes (Signed)
Reviewed discharge instructions with pt and answered questions at this time.

## 2015-08-18 NOTE — Discharge Summary (Signed)
Physician Discharge Summary  Sara [name] [id number] DOB: [birth year] DOA: 08/17/2015  PCP: Lissa Morales D, MD  Admit date: 08/17/2015 Discharge date: 08/18/2015  Time spent: 35 minutes  Recommendations for Outpatient Follow-up:  1. Follow up dialysis as per nephrologist Dr Hinda Lenis.   Discharge Diagnoses:  Principal Problem:   Encounter for hemodialysis for end-stage renal disease ([place]) Active Problems:   Schizoaffective disorder, bipolar type ([place])   ESRD needing dialysis ([place])   Chronic systolic CHF (congestive heart failure) ([place])   Discharge Condition: stable.   Diet recommendation: Renal diet.   Filed Weights   08/17/15 0806 08/17/15 1533 08/18/15 1030  Weight: 92.534 kg (204 lb) 92.534 kg (204 lb) 93.2 kg (205 lb 7.5 oz)    History of present illness: patient was admitted OBS by me on May 12 so she can get her dialysis.  As per my prior H and P:  " Sara [name] is an 35 y.o. female with hx of ESRD, lupus nephritis, severe non compliant, terminated from her previous nephrologist in [place] for behavior issues, presented to the ER for hemodialysis. She denied SOB, CP, F, or Chills. Dr Chilton Greathouse was aware, and intended to dialyze her today. Hospitalist was asked to admit her OBS for same. She has normal K.  Hospital Course: She was admitted and her meds were continued.  She was seen by Dr Inetta Fermo, and had dialysis today.  It was uneventful and she has no complaints.  She will be discharged today to home, and will follow up with her next dialysis, likely thru the ER again, as she was terminated by her prior nephrologist.  A better procedure so she can get her routine dialysis has been brought to the attention of higher management level, but as of now, no resolution has been reached.  Thanks.   Procedures:  Hemodialysis.   Consultations:  Nephrology:  Dr Inetta Fermo.   Discharge Exam: Filed Vitals:   08/18/15 1520 08/18/15 1540  BP: 167/90 164/98  Pulse:  99 104  Temp:    Resp:      Discharge Instructions   Discharge Instructions    Diet - low sodium heart healthy    Complete by:  As directed      Discharge instructions    Complete by:  As directed   Continue with your dialysis as told by Dr Chilton Greathouse.   Take your medicine as directed.     Increase activity slowly    Complete by:  As directed           Discharge Medication List as of 08/18/2015  4:07 PM    START taking these medications   Details  sevelamer carbonate (RENVELA) 800 MG tablet Take 4 tablets (3,200 mg total) by mouth 3 (three) times daily with meals., Starting 08/18/2015, Until Discontinued, Print      CONTINUE these medications which have NOT CHANGED   Details  acetaminophen (TYLENOL) 500 MG tablet Take 1,000 mg by mouth every 6 (six) hours as needed for moderate pain. Reported on 06/12/2015, Until Discontinued, Historical Med    furosemide (LASIX) 40 MG tablet Take 3 tablets (120 mg total) by mouth daily., Starting 08/11/2015, Until Discontinued, Print    hydrALAZINE (APRESOLINE) 10 MG tablet Take 1 tablet (10 mg total) by mouth 3 (three) times daily., Starting 08/11/2015, Until Discontinued, Print    metoprolol (LOPRESSOR) 50 MG tablet Take 1 tablet (50 mg total) by mouth 2 (two) times daily., Starting 08/11/2015, Until Discontinued, Print    predniSONE (  DELTASONE) 20 MG tablet Take 1 tablet (20 mg total) by mouth daily with breakfast., Starting 07/02/2015, Until Discontinued, No Print      STOP taking these medications     loratadine (CLARITIN) 10 MG tablet      azithromycin (ZITHROMAX) 250 MG tablet        Allergies  Allergen Reactions  . Ativan [Lorazepam] Swelling and Other (See Comments)    Dysarthria(patient has difficulty speaking and slurred speech); denies swelling, itching, pain, or numbness.  [name] [Ziprasidone Hcl] Itching and Swelling    Tongue swelling  . Keflex [Cephalexin] Swelling and Other (See Comments)    Tongue swelling. Can't talk    . Haldol [Haloperidol Lactate] Swelling    Tongue swelling. 05/31/15 - MD ok with giving as pt has tolerated in the past Pt can take benadryl.  . Other Itching    wool      The results of significant diagnostics from this hospitalization (including imaging, microbiology, ancillary and laboratory) are listed below for reference.    Significant Diagnostic Studies: Dg Chest 2 View  08/11/2015  CLINICAL DATA:  Shortness of breath EXAM: CHEST  2 VIEW COMPARISON:  07/08/2015 FINDINGS: Chronic cardiopericardial enlargement and aortic tortuosity. Trace effusions and congested appearing vessels. No overt edema. Negative for pneumonia. IMPRESSION: Cardiomegaly, venous congestion, and trace effusions. Electronically Signed   By: Monte Fantasia M.D.   On: 08/11/2015 12:52    Microbiology: Recent Results (from the past 240 hour(s))  Rapid strep screen     Status: None   Collection Time: 08/11/15 12:19 PM  Result Value Ref Range Status   Streptococcus, Group A Screen (Direct) NEGATIVE NEGATIVE Final    Comment: (NOTE) A Rapid Antigen test may result negative if the antigen level in the sample is below the detection level of this test. The FDA has not cleared this test as a stand-alone test therefore the rapid antigen negative result has reflexed to a Group A Strep culture.   Culture, group A strep     Status: None   Collection Time: 08/11/15 12:19 PM  Result Value Ref Range Status   Specimen Description THROAT  Final   Special Requests NONE Reflexed from [id number]  Final   Culture   Final    NO GROUP A STREP (S.PYOGENES) ISOLATED Performed at [facility]    Report Status 08/14/2015 FINAL  Final     Labs: Basic Metabolic Panel:  Recent Labs Lab 08/13/15 1010 08/15/15 0844 08/17/15 0836 08/17/15 1556 08/18/15 0600  NA 136 138 133* 134* 134*  K 4.5 4.3 4.8 5.4* 5.6*  CL 96* 100* 99* 99* 99*  CO2 23  --  20* 19* 19*  GLUCOSE 100* 92 101* 135* 86  BUN 72* 89* 118* 117* 128*   CREATININE 10.30* 12.80* 15.15* 16.02* 15.91*  CALCIUM 9.1  --  8.5* 8.8* 9.0  PHOS  --   --   --  12.0*  --    Liver Function Tests:  Recent Labs Lab 08/17/15 1556  ALBUMIN 3.2*   CBC:  Recent Labs Lab 08/15/15 0844 08/17/15 0836 08/17/15 1556  WBC  --  8.2 6.3  NEUTROABS  --  6.4  --   HGB 10.5* 9.1* 8.8*  HCT 31.0* 27.9* 26.7*  MCV  --  88.3 88.4  PLT  --  220 164     Recent Labs  09/11/14 0815  BNP 1694.9*     SignedOrvan Falconer MD.  [facility] 08/18/2015, 5:08  PM

## 2015-08-18 NOTE — Care Management Obs Status (Signed)
[facility] NOTIFICATION   Patient Details  Name: Sara [name] MRN: [id number] Date of Birth: 15-Jun-1980   Medicare Observation Status Notification Given:  Yes    Briant Sites, RN 08/18/2015, 4:04 PM

## 2015-08-18 NOTE — Progress Notes (Addendum)
Subjective: Interval History: has no complaint of nausea or vomiting. Patient has some complaints of difficulty breathing. Her appetite is good.  Objective: Vital signs in last 24 hours: Temp:  [97.9 F (36.6 C)-98.3 F (36.8 C)] 98.2 F (36.8 C) (05/13 0512) Pulse Rate:  [88-99] 94 (05/13 0512) Resp:  [17-22] 20 (05/13 0512) BP: (131-161)/(94-116) 150/102 mmHg (05/13 0512) SpO2:  [96 %-100 %] 100 % (05/13 0512) Weight:  [92.534 kg (204 lb)] 92.534 kg (204 lb) (05/12 1533) Weight change:   Intake/Output from previous day:   Intake/Output this shift: Total I/O In: 240 [P.O.:240] Out: -   Generally patient is alert and in no apparent distress She has some eyelid and facial puffiness. Chest: She has some expiratory wheezing Extremities she has 1+ edema   Lab Results:  Recent Labs  08/17/15 0836 08/17/15 1556  WBC 8.2 6.3  HGB 9.1* 8.8*  HCT 27.9* 26.7*  PLT 220 164   BMET:  Recent Labs  08/17/15 1556 08/18/15 0600  NA 134* 134*  K 5.4* 5.6*  CL 99* 99*  CO2 19* 19*  GLUCOSE 135* 86  BUN 117* 128*  CREATININE 16.02* 15.91*  CALCIUM 8.8* 9.0   No results for input(s): PTH in the last 72 hours. Iron Studies: No results for input(s): IRON, TIBC, TRANSFERRIN, FERRITIN in the last 72 hours.  Studies/Results: No results found.   current medications.  Assessment/Plan:  Problem #1 fluid overload. Her last dialysis was on Saturday. Presently she is has some symptoms of fluid overload. Problem #2 end-stage renal disease: Her BUN and creatinine is high. Patient has this moment denies any nausea or vomiting. Problem #3 hyperkalemia Problem #4 anemia: Her hemoglobin is low. Problem #5 hypertension: Her blood pressure is reasonably controlled Problem# 6 history of lupus Problem #7. Metabolic bone disease: Her calcium is in range But her phosphorus is high. At this moment no shortness is taking her binders. Plan: We will make arrangements for patient to get dialysis  today for 4-1/2 hours We'll remove about 4 L and her blood pressure tolerates. We will give her Epogen 10,000 units at dialysis.  We'll give her Renvela 800 mg 4 tablets by mouth 3 times a day with meals and 2 with snack.     Alexas Basulto S 08/18/2015,9:21 AM

## 2015-08-18 NOTE — Procedures (Signed)
   HEMODIALYSIS TREATMENT NOTE:  4.5 hour low-heparin dialysis completed via right upper arm AVF.  Venous buttonhole tunnel has failed.  Unsuccessful cannulation x2.  Due to AVF tortuosity and aneurysms, cannulation sites are limited.  Higher than usual VPs throughout HD session (VP 240 at Qb 300) and right arm swelling.  Suspect central venous stenosis; recommend fistulogram.  Goal met: 4 liters removed without interruption in ultrafiltration. All blood was returned and hemostasis was achieved within 15 minutes. Report called to Tacey Heap, RN.  Rockwell Alexandria, RN, CDN

## 2015-08-18 NOTE — Progress Notes (Signed)
Pt back from dialysis, no complaints.  Notified Dr Marin Comment of pt request to discharge.

## 2015-08-19 LAB — HEPATITIS B SURFACE ANTIGEN: HEP B S AG: NEGATIVE

## 2015-08-22 ENCOUNTER — Observation Stay (HOSPITAL_COMMUNITY)
Admission: EM | Admit: 2015-08-22 | Discharge: 2015-08-22 | Disposition: A | Discharge status: Home / Self Care | Payer: Medicare Other | Attending: Internal Medicine | Admitting: Internal Medicine

## 2015-08-22 ENCOUNTER — Encounter (HOSPITAL_COMMUNITY): Payer: Self-pay

## 2015-08-22 ENCOUNTER — Emergency Department (HOSPITAL_COMMUNITY): Payer: Medicare Other

## 2015-08-22 DIAGNOSIS — Z4931 Encounter for adequacy testing for hemodialysis: Secondary | ICD-10-CM | POA: Diagnosis present

## 2015-08-22 DIAGNOSIS — I120 Hypertensive chronic kidney disease with stage 5 chronic kidney disease or end stage renal disease: Secondary | ICD-10-CM | POA: Diagnosis not present

## 2015-08-22 DIAGNOSIS — Z79899 Other long term (current) drug therapy: Secondary | ICD-10-CM | POA: Diagnosis not present

## 2015-08-22 DIAGNOSIS — R778 Other specified abnormalities of plasma proteins: Secondary | ICD-10-CM | POA: Diagnosis present

## 2015-08-22 DIAGNOSIS — N186 End stage renal disease: Secondary | ICD-10-CM | POA: Diagnosis not present

## 2015-08-22 DIAGNOSIS — F17210 Nicotine dependence, cigarettes, uncomplicated: Secondary | ICD-10-CM | POA: Diagnosis not present

## 2015-08-22 DIAGNOSIS — Z992 Dependence on renal dialysis: Secondary | ICD-10-CM | POA: Insufficient documentation

## 2015-08-22 DIAGNOSIS — F250 Schizoaffective disorder, bipolar type: Secondary | ICD-10-CM | POA: Diagnosis not present

## 2015-08-22 DIAGNOSIS — R0602 Shortness of breath: Secondary | ICD-10-CM | POA: Insufficient documentation

## 2015-08-22 DIAGNOSIS — I5023 Acute on chronic systolic (congestive) heart failure: Secondary | ICD-10-CM | POA: Diagnosis not present

## 2015-08-22 DIAGNOSIS — D631 Anemia in chronic kidney disease: Secondary | ICD-10-CM | POA: Diagnosis not present

## 2015-08-22 DIAGNOSIS — I132 Hypertensive heart and chronic kidney disease with heart failure and with stage 5 chronic kidney disease, or end stage renal disease: Principal | ICD-10-CM | POA: Insufficient documentation

## 2015-08-22 DIAGNOSIS — M3214 Glomerular disease in systemic lupus erythematosus: Secondary | ICD-10-CM | POA: Diagnosis not present

## 2015-08-22 DIAGNOSIS — R079 Chest pain, unspecified: Secondary | ICD-10-CM | POA: Diagnosis not present

## 2015-08-22 DIAGNOSIS — E8770 Fluid overload, unspecified: Secondary | ICD-10-CM | POA: Diagnosis not present

## 2015-08-22 DIAGNOSIS — R7989 Other specified abnormal findings of blood chemistry: Secondary | ICD-10-CM

## 2015-08-22 DIAGNOSIS — I10 Essential (primary) hypertension: Secondary | ICD-10-CM | POA: Diagnosis present

## 2015-08-22 LAB — BASIC METABOLIC PANEL
ANION GAP: 16 — AB (ref 5–15)
BUN: 102 mg/dL — ABNORMAL HIGH (ref 6–20)
CO2: 21 mmol/L — AB (ref 22–32)
Calcium: 9.2 mg/dL (ref 8.9–10.3)
Chloride: 99 mmol/L — ABNORMAL LOW (ref 101–111)
Creatinine, Ser: 14.48 mg/dL — ABNORMAL HIGH (ref 0.44–1.00)
GFR, EST AFRICAN AMERICAN: 3 mL/min — AB (ref >60)
GFR, EST NON AFRICAN AMERICAN: 3 mL/min — AB (ref >60)
GLUCOSE: 121 mg/dL — AB (ref 65–99)
POTASSIUM: 5 mmol/L (ref 3.5–5.1)
Sodium: 136 mmol/L (ref 135–145)

## 2015-08-22 LAB — CBC WITH DIFFERENTIAL/PLATELET
BASOS ABS: 0 10*3/uL (ref 0.0–0.1)
Basophils Relative: 0 %
Eosinophils Absolute: 0.1 10*3/uL (ref 0.0–0.7)
Eosinophils Relative: 1 %
HEMATOCRIT: 24.6 % — AB (ref 36.0–46.0)
Hemoglobin: 8.1 g/dL — ABNORMAL LOW (ref 12.0–15.0)
LYMPHS PCT: 20 %
Lymphs Abs: 1.5 10*3/uL (ref 0.7–4.0)
MCH: 28.8 pg (ref 26.0–34.0)
MCHC: 32.9 g/dL (ref 30.0–36.0)
MCV: 87.5 fL (ref 78.0–100.0)
Monocytes Absolute: 0.3 10*3/uL (ref 0.1–1.0)
Monocytes Relative: 4 %
NEUTROS ABS: 5.7 10*3/uL (ref 1.7–7.7)
Neutrophils Relative %: 75 %
PLATELETS: 206 10*3/uL (ref 150–400)
RBC: 2.81 MIL/uL — AB (ref 3.87–5.11)
RDW: 16.6 % — ABNORMAL HIGH (ref 11.5–15.5)
WBC: 7.6 10*3/uL (ref 4.0–10.5)

## 2015-08-22 LAB — I-STAT TROPONIN, ED: Troponin i, poc: 0.28 ng/mL (ref 0.00–0.08)

## 2015-08-22 MED ORDER — DIPHENHYDRAMINE HCL 25 MG PO CAPS
25.0000 mg | ORAL_CAPSULE | Freq: Four times a day (QID) | ORAL | Status: DC | PRN
Start: 2015-08-22 — End: 2015-08-22

## 2015-08-22 MED ORDER — TRAMADOL HCL 50 MG PO TABS: 50.0000 mg | ORAL_TABLET | Freq: Two times a day (BID) | ORAL | Status: DC

## 2015-08-22 MED ORDER — HEPARIN SODIUM (PORCINE) 1000 UNIT/ML IJ SOLN
INTRAMUSCULAR | Status: AC
  Administered 2015-08-22: 1400 [IU] via INTRAVENOUS_CENTRAL
  Filled 2015-08-22: qty 6

## 2015-08-22 MED ORDER — FUROSEMIDE 80 MG PO TABS: 120.0000 mg | ORAL_TABLET | Freq: Every day | ORAL | Status: DC

## 2015-08-22 MED ORDER — LIDOCAINE-PRILOCAINE 2.5-2.5 % EX CREA: 1.0000 "application " | TOPICAL_CREAM | CUTANEOUS | Status: DC | PRN

## 2015-08-22 MED ORDER — ACETAMINOPHEN 500 MG PO TABS: 1000.0000 mg | ORAL_TABLET | Freq: Four times a day (QID) | ORAL | Status: DC | PRN

## 2015-08-22 MED ORDER — SODIUM CHLORIDE 0.9 % IV SOLN: 100.0000 mL | INTRAVENOUS | Status: DC | PRN

## 2015-08-22 MED ORDER — EPOETIN ALFA 4000 UNIT/ML IJ SOLN
INTRAMUSCULAR | Status: AC
  Administered 2015-08-22: 4000 [IU] via INTRAVENOUS
  Filled 2015-08-22: qty 1

## 2015-08-22 MED ORDER — EPOETIN ALFA 4000 UNIT/ML IJ SOLN
4000.0000 [IU] | INTRAMUSCULAR | Status: DC
  Administered 2015-08-22: 4000 [IU] via INTRAVENOUS

## 2015-08-22 MED ORDER — EPOETIN ALFA 4000 UNIT/ML IJ SOLN: 4000.0000 [IU] | INTRAMUSCULAR | Status: DC

## 2015-08-22 MED ORDER — ASPIRIN EC 325 MG PO TBEC: 325.0000 mg | DELAYED_RELEASE_TABLET | Freq: Every day | ORAL | Status: DC

## 2015-08-22 MED ORDER — LIDOCAINE HCL (PF) 1 % IJ SOLN
INTRAMUSCULAR | Status: AC
  Administered 2015-08-22: 0.5 mL via INTRADERMAL
  Filled 2015-08-22: qty 5

## 2015-08-22 MED ORDER — ACETAMINOPHEN 325 MG PO TABS: 650.0000 mg | ORAL_TABLET | ORAL | Status: DC | PRN

## 2015-08-22 MED ORDER — HYDRALAZINE HCL 10 MG PO TABS: 10.0000 mg | ORAL_TABLET | Freq: Three times a day (TID) | ORAL | Status: DC

## 2015-08-22 MED ORDER — METOPROLOL TARTRATE 50 MG PO TABS: 50.0000 mg | ORAL_TABLET | Freq: Two times a day (BID) | ORAL | Status: DC

## 2015-08-22 MED ORDER — PENTAFLUOROPROP-TETRAFLUOROETH EX AERO: 1.0000 "application " | INHALATION_SPRAY | CUTANEOUS | Status: DC | PRN

## 2015-08-22 MED ORDER — ONDANSETRON HCL 4 MG/2ML IJ SOLN: 4.0000 mg | Freq: Four times a day (QID) | INTRAMUSCULAR | Status: DC | PRN

## 2015-08-22 MED ORDER — HEPARIN SODIUM (PORCINE) 1000 UNIT/ML DIALYSIS
20.0000 [IU]/kg | INTRAMUSCULAR | Status: DC | PRN
  Administered 2015-08-22: 1400 [IU] via INTRAVENOUS_CENTRAL

## 2015-08-22 MED ORDER — PREDNISONE 20 MG PO TABS: 20.0000 mg | ORAL_TABLET | Freq: Every day | ORAL | Status: DC

## 2015-08-22 MED ORDER — SEVELAMER CARBONATE 800 MG PO TABS: 3200.0000 mg | ORAL_TABLET | Freq: Three times a day (TID) | ORAL | Status: DC

## 2015-08-22 MED ORDER — HEPARIN SODIUM (PORCINE) 5000 UNIT/ML IJ SOLN: 5000.0000 [IU] | Freq: Three times a day (TID) | INTRAMUSCULAR | Status: DC

## 2015-08-22 MED ORDER — LIDOCAINE HCL (PF) 1 % IJ SOLN
5.0000 mL | INTRAMUSCULAR | Status: DC | PRN
  Administered 2015-08-22: 0.5 mL via INTRADERMAL

## 2015-08-22 NOTE — Consult Note (Signed)
Sara [name] MRN: [id number] DOB/AGE: [birth year] 35 y.o. Primary Care Physician:Sara D, MD Admit date: 08/22/2015 Chief Complaint:  Chief Complaint  Patient presents with  . dialysis    HPI: Pt is 35 year old female with past medical hx of ESRD who was admitted with c/o dyspnea," I need dialysis "  HPI dates back to few days ago started feeling short of breath,and so pt came to ER asking for her dialysis. Pt says " I need my dialysis,I had my last dialysis on  08/18/15 ". Pt c/o chest pain.pt says " my chest hurts"  No c/o fever/cough/chills NO c/o nausea/vomiting/ diarrhea No c/o hematuria Pt was seen  for need of renal replacement therapy.   Past Medical History  Diagnosis Date  . Lupus ([place])     lupus w nephritis  . History of blood transfusion     "this is probably my 3rd" (10/09/2014)  . ESRD (end stage renal disease) on dialysis [facility])     "[name]; [facility]" (10/09/2014)  . Bipolar disorder ([place])     Sara [name] 10/09/2014  . Schizophrenia ([place])     Sara [name] 10/09/2014  . Chronic anemia     Sara [name] 10/09/2014  . CHF (congestive heart failure) ([place])     systolic/notes [id number]  . Acute myopericarditis     hx/notes 10/09/2014  . Psychosis   . Hypertension   . Pregnancy   . Low back pain   . Positive ANA (antinuclear antibody) 08/16/2012  . 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 Tobacco abuse 02/20/2014  . Schizoaffective disorder, bipolar type ([place]) 11/20/2014  . Non-compliant patient         Family History  Problem Relation Age of Onset  . Drug abuse Father   . Kidney disease Father     Social History:  reports that she has been smoking Cigarettes.  She has a 2 pack-year smoking history. She uses smokeless tobacco. She reports that she drinks about 4.2 oz of alcohol per week. She reports that she uses illicit drugs (Marijuana and Cocaine).   Allergies:  Allergies  Allergen Reactions  . Ativan [Lorazepam] Swelling and Other (See  Comments)    Dysarthria(patient has difficulty speaking and slurred speech); denies swelling, itching, pain, or numbness.  Sara [name] [Ziprasidone Hcl] Itching and Swelling    Tongue swelling  . Keflex [Cephalexin] Swelling and Other (See Comments)    Tongue swelling. Can't talk   . Haldol [Haloperidol Lactate] Swelling    Tongue swelling. 05/31/15 - MD ok with giving as pt has tolerated in the past Pt can take benadryl.  . Other Itching    wool     (Not in a hospital admission)     [id number] from the symptoms mentioned above,there are no other symptoms referable to all systems reviewed.      Physical Exam: Vital signs in last 24 hours: Temp:  [98 F (36.7 C)] 98 F (36.7 C) (05/17 0905) Pulse Rate:  [89] 89 (05/17 0905) Resp:  [22] 22 (05/17 0905) BP: (158)/(119) 158/119 mmHg (05/17 0905) SpO2:  [99 %] 99 % (05/17 0905) Weight:  [150 lb (68.04 kg)] 150 lb (68.04 kg) (05/17 0905) Weight change:     Intake/Output from previous day:       Physical Exam: General- pt is awake,alert, follows coomands Resp- No acute REsp distress,  decreased bs at bases. CVS- S1S2 regular in rate and rhythm, NO rubs  GIT- BS+, soft, NT, ND EXT- 2+  LE Edema, NO Cyanosis CNS- CN 2-12 grossly intact. Moving all 4 extremities Access- AVF+, aneurysm present    Lab Results:  CBC    Component Value Date/Time   WBC 7.6 08/22/2015 0930   RBC 2.81* 08/22/2015 0930   RBC 2.77* 06/21/2015 1022   HGB 8.1* 08/22/2015 0930   HCT 24.6* 08/22/2015 0930   PLT 206 08/22/2015 0930   MCV 87.5 08/22/2015 0930   MCH 28.8 08/22/2015 0930   MCHC 32.9 08/22/2015 0930   RDW 16.6* 08/22/2015 0930   LYMPHSABS 1.5 08/22/2015 0930   MONOABS 0.3 08/22/2015 0930   EOSABS 0.1 08/22/2015 0930   BASOSABS 0.0 08/22/2015 0930      BMET  Recent Labs  08/22/15 0930  NA 136  K 5.0  CL 99*  CO2 21*  GLUCOSE 121*  BUN 102*  CREATININE 14.48*  CALCIUM 9.2    MICRO No results found for this or  any previous visit (from the past 240 hour([name])).    Lab Results  Component Value Date   PTH 420* 05/26/2015   CALCIUM 9.2 08/22/2015   CAION 1.12 08/15/2015   PHOS 12.0* 08/17/2015      Impression: 1)Renal  ESRD on HD                Pt is not in regular  Schedule sec to her complaince/adherence issues                Will dialyze pt today  2)HTN Bp is not at goal  3)Anemia In ESRD the goal for HGb is 9--11. Pt HGb is notat goal Will keep  on epo   4)CKD Mineral-Bone Disorder PTH acceptable. Secondary Hyperparathyroidism  Present. Phosphorus not at goal.  on binders  5)Psych .  Hx of   psycosis Schizophrenia Primary MD following  6)Electrolytes  Normokalemic  NOrmonatremic   7)Acid base Co2 just  at goal Sec to no adherence to HD   Plan:  Will dialyze today Will keep  on epo Will use 2 k bath Will try to take 3 liters off  I educated pt at length to get Primary care and regular Hd center as outpt.    Sara [name] 08/22/2015, 10:57 AM

## 2015-08-22 NOTE — ED Notes (Signed)
Dr. Oleta Mouse notified of elevated Troponin level.

## 2015-08-22 NOTE — Progress Notes (Signed)
Pt received to room 336. Pt is alert oriented Pt refuses telemetry at this time.  Transferred via bed to dialysis

## 2015-08-22 NOTE — Procedures (Signed)
HEMODIALYSIS TREATMENT NOTE:  4 hour low-heparin dialysis completed via right upper arm AVF (16g ante/retrograde). Goal met: 3 liters removed without interruption in ultrafiltration. All blood was returned and hemostasis was achieved within 15 minutes. Report called to Tedd Sias, RN.  Rockwell Alexandria, RN, CDN

## 2015-08-22 NOTE — Progress Notes (Signed)
Pt discharged self against medical advice.  Dr Roderic Palau notified
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
## 2015-08-22 NOTE — Discharge Summary (Signed)
Physician Discharge Summary  [name] [name] DOB: [birth year] DOA: 08/22/2015  PCP: Lissa Morales D, MD  Admit date: 08/22/2015 Discharge date: 08/22/2015  Time spent: 15 minutes  1. [place] ADVICE   Discharge Diagnoses:  Active Problems:   Essential hypertension   Acute on chronic systolic congestive heart failure (HCC)   Hypervolemia   End stage renal disease on dialysis [facility])   Elevated troponin   Chest pain    Filed Weights   08/22/15 0905  Weight: 68.04 kg (150 lb)    History of present illness:  Sara [name] is a 35 y.o. female with medical history significant of hypertension, end-stage renal disease on hemodialysis, chronic systolic congestive heart failure with ejection fraction of 40-45%. Patient also has a long history of noncompliance. Last dialysis approximately 4 days ago. She comes in the hospital today with complaints of shortness of breath and substernal chest discomfort. Describes the pain occurring for the past 2 days. Worse while lying down. Has associated shortness of breath. No diaphoresis. No nausea, vomiting or diarrhea.   Hospital Course:  This patient was admitted to the hospital earlier today for volume overload, in need of hemodialysis. She had initially presented with shortness of breath and chest pain. Troponin was noted to be mildly elevated, which may be related to demand ischemia in the setting of volume overload. It was recommended the patient in the hospital overnight for observation and continue trending of her cardiac enzymes. She felt significantly improved after dialysis and left hospital against medical device. Patient has a long history of noncompliance and leaving the hospital [place].  Procedures:  Hemodialysis 5/17  Consultations:  nephrology   Allergies  Allergen Reactions  . Ativan [Lorazepam] Swelling and Other (See Comments)    Dysarthria(patient has  difficulty speaking and slurred speech); denies swelling, itching, pain, or numbness.  [name] [Ziprasidone Hcl] Itching and Swelling    Tongue swelling  . Keflex [Cephalexin] Swelling and Other (See Comments)    Tongue swelling. Can't talk   . Haldol [Haloperidol Lactate] Swelling    Tongue swelling. 05/31/15 - MD ok with giving as pt has tolerated in the past Pt can take benadryl.  . Other Itching    wool      The results of significant diagnostics from this hospitalization (including imaging, microbiology, ancillary and laboratory) are listed below for reference.    Significant Diagnostic Studies: Dg Chest 2 View  08/22/2015  CLINICAL DATA:  Shortness of breath and chest pain EXAM: CHEST  2 VIEW COMPARISON:  08/11/2015 FINDINGS: Cardiomegaly with congested and cephalized vessels, fissural thickening, and trace effusions. No consolidation. Stable aortic and hilar contours. IMPRESSION: Chronic cardiomegaly, venous congestion, and trace effusions. Electronically Signed   By: Monte Fantasia M.D.   On: 08/22/2015 09:52   Dg Chest 2 View  08/11/2015  CLINICAL DATA:  Shortness of breath EXAM: CHEST  2 VIEW COMPARISON:  07/08/2015 FINDINGS: Chronic cardiopericardial enlargement and aortic tortuosity. Trace effusions and congested appearing vessels. No overt edema. Negative for pneumonia. IMPRESSION: Cardiomegaly, venous congestion, and trace effusions. Electronically Signed   By: Monte Fantasia M.D.   On: 08/11/2015 12:52    Microbiology: No results found for this or any previous visit (from the past 240 hour(s)).   Labs: Basic Metabolic Panel:  Recent Labs Lab 08/17/15 0836 08/17/15 1556 08/18/15 0600 08/22/15 0930  NA 133* 134* 134* 136  K 4.8 5.4* 5.6* 5.0  CL 99* 99* 99*  99*  CO2 20* 19* 19* 21*  GLUCOSE 101* 135* 86 121*  BUN 118* 117* 128* 102*  CREATININE 15.15* 16.02* 15.91* 14.48*  CALCIUM 8.5* 8.8* 9.0 9.2  PHOS  --  12.0*  --   --    Liver Function  Tests:  Recent Labs Lab 08/17/15 1556  ALBUMIN 3.2*   No results for input(s): LIPASE, AMYLASE in the last 168 hours. No results for input(s): AMMONIA in the last 168 hours. CBC:  Recent Labs Lab 08/17/15 0836 08/17/15 1556 08/22/15 0930  WBC 8.2 6.3 7.6  NEUTROABS 6.4  --  5.7  HGB 9.1* 8.8* 8.1*  HCT 27.9* 26.7* 24.6*  MCV 88.3 88.4 87.5  PLT 220 164 206   Cardiac Enzymes: No results for input(s): CKTOTAL, CKMB, CKMBINDEX, TROPONINI in the last 168 hours. BNP: BNP (last 3 results)  Recent Labs  09/11/14 0815  BNP 1694.9*    ProBNP (last 3 results) No results for input(s): PROBNP in the last 8760 hours.  CBG: No results for input(s): GLUCAP in the last 168 hours.     SignedKathie Dike MD.  [facility] 08/22/2015, 7:32 PM
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
## 2015-08-22 NOTE — ED Notes (Signed)
Pt here for dialysis treatment.  Last dialysis was Saturday.  Denies any complaints.

## 2015-08-23 ENCOUNTER — Other Ambulatory Visit (HOSPITAL_COMMUNITY): Payer: Self-pay | Admitting: *Deleted

## 2015-08-24 ENCOUNTER — Encounter (HOSPITAL_COMMUNITY): Payer: Self-pay | Admitting: *Deleted

## 2015-08-24 ENCOUNTER — Emergency Department (HOSPITAL_COMMUNITY)
Admission: EM | Admit: 2015-08-24 | Discharge: 2015-08-24 | Disposition: A | Discharge status: Home / Self Care | Payer: Medicare Other | Attending: Emergency Medicine | Admitting: Emergency Medicine

## 2015-08-24 DIAGNOSIS — F250 Schizoaffective disorder, bipolar type: Secondary | ICD-10-CM | POA: Insufficient documentation

## 2015-08-24 DIAGNOSIS — Z992 Dependence on renal dialysis: Secondary | ICD-10-CM | POA: Insufficient documentation

## 2015-08-24 DIAGNOSIS — N186 End stage renal disease: Secondary | ICD-10-CM | POA: Insufficient documentation

## 2015-08-24 DIAGNOSIS — Z4902 Encounter for fitting and adjustment of peritoneal dialysis catheter: Secondary | ICD-10-CM | POA: Diagnosis not present

## 2015-08-24 DIAGNOSIS — F17210 Nicotine dependence, cigarettes, uncomplicated: Secondary | ICD-10-CM | POA: Insufficient documentation

## 2015-08-24 DIAGNOSIS — I509 Heart failure, unspecified: Secondary | ICD-10-CM | POA: Diagnosis not present

## 2015-08-24 DIAGNOSIS — I132 Hypertensive heart and chronic kidney disease with heart failure and with stage 5 chronic kidney disease, or end stage renal disease: Secondary | ICD-10-CM | POA: Insufficient documentation

## 2015-08-24 DIAGNOSIS — Z79899 Other long term (current) drug therapy: Secondary | ICD-10-CM | POA: Diagnosis not present

## 2015-08-24 LAB — BASIC METABOLIC PANEL
ANION GAP: 16 — AB (ref 5–15)
BUN: 81 mg/dL — AB (ref 6–20)
CALCIUM: 9 mg/dL (ref 8.9–10.3)
CO2: 24 mmol/L (ref 22–32)
CREATININE: 11.85 mg/dL — AB (ref 0.44–1.00)
Chloride: 98 mmol/L — ABNORMAL LOW (ref 101–111)
GFR calc Af Amer: 4 mL/min — ABNORMAL LOW (ref >60)
GFR, EST NON AFRICAN AMERICAN: 4 mL/min — AB (ref >60)
GLUCOSE: 95 mg/dL (ref 65–99)
POTASSIUM: 4.7 mmol/L (ref 3.5–5.1)
SODIUM: 138 mmol/L (ref 135–145)
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
## 2015-08-24 NOTE — Discharge Instructions (Signed)
You do not need dialysis today. Return for worsening symptoms, including difficulty breathing, chest pain, worsening swelling or any other symptoms concerning to you.   End-Stage Kidney Disease The kidneys are two organs that lie on either side of the spine between the middle of the back and the front of the abdomen. The kidneys:   Remove wastes and extra water from the blood.   Produce important hormones. These help keep bones strong, regulate blood pressure, and help create red blood cells.   Balance the fluids and chemicals in the blood and tissues. End-stage kidney disease occurs when the kidneys are so damaged that they cannot do their job. When the kidneys cannot do their job, life-threatening problems occur. The body cannot stay clean and strong without the help of the kidneys. In end-stage kidney disease, the kidneys cannot get better.You need a new kidney or treatments to do some of the work healthy kidneys do in order to stay alive. CAUSES  End-stage kidney disease usually occurs when a long-lasting (chronic) kidney disease gets worse. It may also occur after the kidneys are suddenly damaged (acute kidney injury).  SYMPTOMS   Swelling (edema) of the legs, ankles, or feet.   Tiredness (lethargy).   Nausea or vomiting.   Confusion.   Problems with urination, such as:   Decreased urine production.   Frequent urination, especially at night.   Frequent accidents in children who are potty trained.   Muscle twitches and cramps.   Persistent itchiness.   Loss of appetite.   Headaches.   Abnormally dark or light skin.   Numbness in the hands or feet.   Easy bruising.   Frequent hiccups.   Menstruation stops. DIAGNOSIS  Your health care provider will measure your blood pressure and take some tests. These may include:   Urine tests.   Blood tests.   Imaging tests, such as:   An ultrasound exam.   Computed tomography (CT).  A kidney  biopsy. TREATMENT  There are two treatments for end-stage kidney disease:   A procedure that removes toxic wastes from the body (dialysis).   Receiving a new kidney (kidney transplant). Both of these treatments have serious risks and consequences. Your health care provider will help you determine which treatment is best for you based on your health, age, and other factors. In addition to having dialysis or a kidney transplant, you may need to take medicines to control high blood pressure (hypertension) and cholesterol and to decrease phosphorus levels in your blood.  HOME CARE INSTRUCTIONS  Follow your prescribed diet.   Take medicines only as directed by your health care provider.   Do not take any new medicines (prescription, over-the-counter, or nutritional supplements) unless approved by your health care provider. Many medicines can worsen your kidney damage or need to have the dose adjusted.   Keep all follow-up visits as directed by your health care provider. MAKE SURE YOU:  Understand these instructions.  Will watch your condition.  Will get help right away if you are not doing well or get worse.   This information is not intended to replace advice given to you by your health care provider. Make sure you discuss any questions you have with your health care provider.   Document Released: 06/14/2003 Document Revised: 04/14/2014 Document Reviewed: 11/21/2011 Elsevier Interactive Patient Education [employer].

## 2015-08-24 NOTE — ED Notes (Signed)
Pt comes in for dialysis. Pt last got dialysis on Wednesday at this facility. Pt denies any other problems.

## 2015-08-25 DIAGNOSIS — Z4902 Encounter for fitting and adjustment of peritoneal dialysis catheter: Secondary | ICD-10-CM | POA: Diagnosis not present

## 2015-08-28 ENCOUNTER — Encounter (HOSPITAL_COMMUNITY): Payer: Self-pay | Admitting: Emergency Medicine

## 2015-08-28 ENCOUNTER — Other Ambulatory Visit: Payer: Self-pay

## 2015-08-28 ENCOUNTER — Observation Stay (HOSPITAL_COMMUNITY)
Admission: EM | Admit: 2015-08-28 | Discharge: 2015-08-29 | Disposition: A | Discharge status: Home / Self Care | Payer: Medicare Other | Source: Home / Self Care | Attending: Emergency Medicine | Admitting: Emergency Medicine

## 2015-08-28 DIAGNOSIS — F17210 Nicotine dependence, cigarettes, uncomplicated: Secondary | ICD-10-CM

## 2015-08-28 DIAGNOSIS — Z992 Dependence on renal dialysis: Secondary | ICD-10-CM

## 2015-08-28 DIAGNOSIS — I10 Essential (primary) hypertension: Secondary | ICD-10-CM | POA: Diagnosis not present

## 2015-08-28 DIAGNOSIS — Z79899 Other long term (current) drug therapy: Secondary | ICD-10-CM

## 2015-08-28 DIAGNOSIS — I120 Hypertensive chronic kidney disease with stage 5 chronic kidney disease or end stage renal disease: Secondary | ICD-10-CM | POA: Diagnosis not present

## 2015-08-28 DIAGNOSIS — Z841 Family history of disorders of kidney and ureter: Secondary | ICD-10-CM | POA: Diagnosis not present

## 2015-08-28 DIAGNOSIS — D631 Anemia in chronic kidney disease: Secondary | ICD-10-CM | POA: Diagnosis present

## 2015-08-28 DIAGNOSIS — G8929 Other chronic pain: Secondary | ICD-10-CM | POA: Diagnosis present

## 2015-08-28 DIAGNOSIS — M3214 Glomerular disease in systemic lupus erythematosus: Secondary | ICD-10-CM | POA: Diagnosis present

## 2015-08-28 DIAGNOSIS — N186 End stage renal disease: Secondary | ICD-10-CM

## 2015-08-28 DIAGNOSIS — F250 Schizoaffective disorder, bipolar type: Secondary | ICD-10-CM

## 2015-08-28 DIAGNOSIS — I132 Hypertensive heart and chronic kidney disease with heart failure and with stage 5 chronic kidney disease, or end stage renal disease: Secondary | ICD-10-CM | POA: Insufficient documentation

## 2015-08-28 DIAGNOSIS — E8770 Fluid overload, unspecified: Secondary | ICD-10-CM | POA: Diagnosis not present

## 2015-08-28 DIAGNOSIS — I509 Heart failure, unspecified: Secondary | ICD-10-CM

## 2015-08-28 DIAGNOSIS — R4689 Other symptoms and signs involving appearance and behavior: Secondary | ICD-10-CM | POA: Diagnosis present

## 2015-08-28 DIAGNOSIS — F1910 Other psychoactive substance abuse, uncomplicated: Secondary | ICD-10-CM | POA: Diagnosis present

## 2015-08-28 DIAGNOSIS — Z9119 Patient's noncompliance with other medical treatment and regimen: Secondary | ICD-10-CM | POA: Diagnosis not present

## 2015-08-28 DIAGNOSIS — I5022 Chronic systolic (congestive) heart failure: Secondary | ICD-10-CM | POA: Diagnosis not present

## 2015-08-28 LAB — CBC WITH DIFFERENTIAL/PLATELET
Basophils Absolute: 0 10*3/uL (ref 0.0–0.1)
Basophils Relative: 0 %
EOS ABS: 0.1 10*3/uL (ref 0.0–0.7)
EOS PCT: 1 %
HCT: 25.4 % — ABNORMAL LOW (ref 36.0–46.0)
HEMOGLOBIN: 8.5 g/dL — AB (ref 12.0–15.0)
LYMPHS ABS: 1.3 10*3/uL (ref 0.7–4.0)
LYMPHS PCT: 17 %
MCH: 28.7 pg (ref 26.0–34.0)
MCHC: 33.5 g/dL (ref 30.0–36.0)
MCV: 85.8 fL (ref 78.0–100.0)
MONOS PCT: 10 %
Monocytes Absolute: 0.8 10*3/uL (ref 0.1–1.0)
Neutro Abs: 5.6 10*3/uL (ref 1.7–7.7)
Neutrophils Relative %: 72 %
PLATELETS: 202 10*3/uL (ref 150–400)
RBC: 2.96 MIL/uL — ABNORMAL LOW (ref 3.87–5.11)
RDW: 16.4 % — AB (ref 11.5–15.5)
WBC: 7.8 10*3/uL (ref 4.0–10.5)

## 2015-08-28 LAB — BASIC METABOLIC PANEL
Anion gap: 19 — ABNORMAL HIGH (ref 5–15)
BUN: 123 mg/dL — AB (ref 6–20)
CALCIUM: 8.9 mg/dL (ref 8.9–10.3)
CO2: 18 mmol/L — ABNORMAL LOW (ref 22–32)
Chloride: 97 mmol/L — ABNORMAL LOW (ref 101–111)
Creatinine, Ser: 17.14 mg/dL — ABNORMAL HIGH (ref 0.44–1.00)
GFR calc Af Amer: 3 mL/min — ABNORMAL LOW (ref >60)
GFR, EST NON AFRICAN AMERICAN: 2 mL/min — AB (ref >60)
GLUCOSE: 137 mg/dL — AB (ref 65–99)
Potassium: 4.7 mmol/L (ref 3.5–5.1)
Sodium: 134 mmol/L — ABNORMAL LOW (ref 135–145)

## 2015-08-28 MED ORDER — SODIUM CHLORIDE 0.9 % IV SOLN: 250.0000 mL | INTRAVENOUS | Status: DC | PRN

## 2015-08-28 MED ORDER — SODIUM CHLORIDE 0.9% FLUSH: 3.0000 mL | Freq: Two times a day (BID) | INTRAVENOUS | Status: DC

## 2015-08-28 MED ORDER — SODIUM CHLORIDE 0.9% FLUSH: 3.0000 mL | INTRAVENOUS | Status: DC | PRN

## 2015-08-28 MED ORDER — ONDANSETRON HCL 4 MG/2ML IJ SOLN
4.0000 mg | Freq: Four times a day (QID) | INTRAMUSCULAR | Status: DC | PRN
  Filled 2015-08-28: qty 2

## 2015-08-28 MED ORDER — ONDANSETRON HCL 4 MG PO TABS
4.0000 mg | ORAL_TABLET | Freq: Four times a day (QID) | ORAL | Status: DC | PRN
  Filled 2015-08-28: qty 1
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
## 2015-08-28 NOTE — ED Notes (Signed)
Attempted to get pulse ox reading on pt. Used multiple pulse oximeters. Pt in NAD.

## 2015-08-28 NOTE — Patient Outreach (Signed)
[facility]) Care Management  08/28/2015  LORA SANKEY 1981-03-28 [id number]   RNCM received referral from [facility] [facility]. [facility]. Member states she does not have a primary care physician currently. Ms. Franklyn states she needs a primary care and a rheumatologist. She reports her primary medical issue is dialysis. Ms. Melka states she goes to [facility] for dialysis, although she lives in [place]. When asked about outpatient dialysis clinics, Ms. Cayanan seemed hesitant to discuss any further. RNCM instructed Ms. Shute to contact her insurance company-customer services and instructed on how to access customer service by using the number on the back of her insurance card. Ms. Percell stated she would call her insurance company.  RNCM provided contact information.  Plan: follow up in two weeks to see if she has found a primary care provider.  Thea Silversmith, RN, MSN, [place] Coordinator Cell: [phone]

## 2015-08-28 NOTE — ED Notes (Signed)
Patient states she needs dialysis. States last dialysis was 7 days ago.

## 2015-08-29 MED ORDER — LIDOCAINE-PRILOCAINE 2.5-2.5 % EX CREA: 1.0000 "application " | TOPICAL_CREAM | CUTANEOUS | Status: DC | PRN

## 2015-08-29 MED ORDER — ALTEPLASE 2 MG IJ SOLR
2.0000 mg | Freq: Once | INTRAMUSCULAR | Status: DC | PRN
  Filled 2015-08-29: qty 2

## 2015-08-29 MED ORDER — LIDOCAINE HCL (PF) 1 % IJ SOLN
5.0000 mL | INTRAMUSCULAR | Status: DC | PRN
  Administered 2015-08-29: 5 mL via INTRADERMAL

## 2015-08-29 MED ORDER — HYDRALAZINE HCL 20 MG/ML IJ SOLN
10.0000 mg | INTRAMUSCULAR | Status: DC | PRN
  Filled 2015-08-29: qty 0.5

## 2015-08-29 MED ORDER — ACETAMINOPHEN 500 MG PO TABS
500.0000 mg | ORAL_TABLET | Freq: Once | ORAL | Status: AC
  Administered 2015-08-29: 500 mg via ORAL
  Filled 2015-08-29: qty 1

## 2015-08-29 MED ORDER — LIDOCAINE HCL (PF) 1 % IJ SOLN
INTRAMUSCULAR | Status: AC
  Administered 2015-08-29: 5 mL via INTRADERMAL
  Filled 2015-08-29: qty 5

## 2015-08-29 MED ORDER — PENTAFLUOROPROP-TETRAFLUOROETH EX AERO: 1.0000 "application " | INHALATION_SPRAY | CUTANEOUS | Status: DC | PRN

## 2015-08-29 MED ORDER — SODIUM CHLORIDE 0.9 % IV SOLN: 100.0000 mL | INTRAVENOUS | Status: DC | PRN

## 2015-08-29 MED ORDER — DIPHENHYDRAMINE HCL 25 MG PO CAPS
25.0000 mg | ORAL_CAPSULE | Freq: Once | ORAL | Status: AC
  Administered 2015-08-29: 25 mg via ORAL
  Filled 2015-08-29: qty 1

## 2015-08-29 NOTE — Consult Note (Signed)
CATERIN PLACIDE MRN: [id number] DOB/AGE: 04/11/1980 35 y.o. Primary Care Physician:MCDIARMID,TODD D, MD Admit date: 08/28/2015 Chief Complaint:  Chief Complaint  Patient presents with  . Needs Dialysis    HPI: Pt is 35 year old female with past medical hx of ESRD who was admitted with c/o dyspnea," I need dialysis "  HPI dates back to few days ago started feeling short of breath,and so pt came to ER asking for her dialysis. Pt says " I need my dialysis,I had my last dialysis on last Wednesday  ". No c/o fever/cough/chills NO c/o nausea/vomiting/ diarrhea No c/o hematuria Pt was seen  for need of renal replacement therapy.   Past Medical History  Diagnosis Date  . Lupus ([place])     lupus w nephritis  . History of blood transfusion     "this is probably my 3rd" (10/09/2014)  . ESRD (end stage renal disease) on dialysis [facility])     "[name]; [facility]" (10/09/2014)  . Bipolar disorder ([name])     [name] 10/09/2014  . Schizophrenia ([place])     [name] 10/09/2014  . Chronic anemia     [name] 10/09/2014  . CHF (congestive heart failure) ([place])     systolic/notes [id number]  . Acute myopericarditis     hx/notes 10/09/2014  . Psychosis   . Hypertension   . Pregnancy   . Low back pain   . Positive ANA (antinuclear antibody) 08/16/2012  . 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 Tobacco abuse 02/20/2014  . Schizoaffective disorder, bipolar type ([place]) 11/20/2014  . Non-compliant patient         Family History  Problem Relation Age of Onset  . Drug abuse Father   . Kidney disease Father     Social History:  reports that she has been smoking Cigarettes.  She has a 2 pack-year smoking history. She uses smokeless tobacco. She reports that she drinks about 4.2 oz of alcohol per week. She reports that she uses illicit drugs (Marijuana and Cocaine).   Allergies:  Allergies  Allergen Reactions  . Ativan [Lorazepam] Swelling and Other (See Comments)    Dysarthria(patient has  difficulty speaking and slurred speech); denies swelling, itching, pain, or numbness.  [name] [Ziprasidone Hcl] Itching and Swelling    Tongue swelling  . Keflex [Cephalexin] Swelling and Other (See Comments)    Tongue swelling. Can't talk   . Haldol [Haloperidol Lactate] Swelling    Tongue swelling. 05/31/15 - MD ok with giving as pt has tolerated in the past Pt can take benadryl.  . Other Itching    wool    Medications Prior to Admission  Medication Sig Dispense Refill  . acetaminophen (TYLENOL) 500 MG tablet Take 1,000 mg by mouth every 6 (six) hours as needed for moderate pain. Reported on 06/12/2015    . furosemide (LASIX) 40 MG tablet Take 3 tablets (120 mg total) by mouth daily. 90 tablet 0  . hydrALAZINE (APRESOLINE) 10 MG tablet Take 1 tablet (10 mg total) by mouth 3 (three) times daily. 21 tablet 0  . metoprolol (LOPRESSOR) 50 MG tablet Take 1 tablet (50 mg total) by mouth 2 (two) times daily. 60 tablet 0  . predniSONE (DELTASONE) 20 MG tablet Take 1 tablet (20 mg total) by mouth daily with breakfast.    . sevelamer carbonate (RENVELA) 800 MG tablet Take 4 tablets (3,200 mg total) by mouth 3 (three) times daily with meals. 90 tablet 0  [id number] from the symptoms mentioned above,there are no other symptoms referable to all systems reviewed.      Physical Exam: Vital signs in last 24 hours: Temp:  [97.6 F (36.4 C)-98.7 F (37.1 C)] 98.7 F (37.1 C) (05/24 0700) Pulse Rate:  [101-115] 115 (05/24 0720) Resp:  [13-20] 13 (05/24 0720) BP: (146-179)/(99-125) 162/105 mmHg (05/24 0720) SpO2:  [93 %-100 %] 98 % (05/24 0700) Weight:  [150 lb (68.04 kg)-210 lb 12.8 oz (95.618 kg)] 209 lb 3.5 oz (94.9 kg) (05/24 0700) Weight change:     Intake/Output from previous day:       Physical Exam: General- pt is awake,alert, follows coomands Resp- No acute REsp distress,  decreased bs at bases. CVS- S1S2 regular in rate and rhythm, NO rubs  GIT- BS+, soft, NT,  ND EXT- 2+ LE Edema, NO Cyanosis CNS- CN 2-12 grossly intact. Moving all 4 extremities Access- AVF+, aneurysm present    Lab Results:  CBC    Component Value Date/Time   WBC 7.8 08/28/2015 1508   RBC 2.96* 08/28/2015 1508   RBC 2.77* 06/21/2015 1022   HGB 8.5* 08/28/2015 1508   HCT 25.4* 08/28/2015 1508   PLT 202 08/28/2015 1508   MCV 85.8 08/28/2015 1508   MCH 28.7 08/28/2015 1508   MCHC 33.5 08/28/2015 1508   RDW 16.4* 08/28/2015 1508   LYMPHSABS 1.3 08/28/2015 1508   MONOABS 0.8 08/28/2015 1508   EOSABS 0.1 08/28/2015 1508   BASOSABS 0.0 08/28/2015 1508      BMET  Recent Labs  08/28/15 1508  NA 134*  K 4.7  CL 97*  CO2 18*  GLUCOSE 137*  BUN 123*  CREATININE 17.14*  CALCIUM 8.9    MICRO No results found for this or any previous visit (from the past 240 hour(s)).    Lab Results  Component Value Date   PTH 420* 05/26/2015   CALCIUM 8.9 08/28/2015   CAION 1.12 08/15/2015   PHOS 12.0* 08/17/2015      Impression: 1)Renal  ESRD on HD                Pt is not in regular  Schedule sec to her complaince/adherence issues                Will dialyze pt today  2)HTN Bp is not at goal  3)Anemia In ESRD the goal for HGb is 9--11. Pt HGb is notat goal Will keep  on epo   4)CKD Mineral-Bone Disorder PTH acceptable. Secondary Hyperparathyroidism  Present. Phosphorus not at goal.  on binders  5)Psych .  Hx of   psycosis Schizophrenia Primary MD following  6)Electrolytes  Normokalemic  Hyponatremic    Sec to ESRD  7)Acid base Co2 not  at goal Sec to no adherence to HD   Plan:  Will dialyze today Will keep  on epo Will use 2 k bath Will try to take 3 liters off  I again  educated pt at length to get Primary care and regular Hd center as outpt.    Lynell Kussman S 08/29/2015, 7:46 AM
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
## 2015-08-29 NOTE — Progress Notes (Signed)
Hemodialysis- Pt brought to HD unit. No complaints this am. Treatment initiated without issue via R AVF with 15g needles x2 (lidocaine prep). BP 179/107. HR 108. Continue to monitor patient.

## 2015-08-29 NOTE — Progress Notes (Signed)
Hemodialysis- Treatment completed without issue. Pt tolerated well. Bedside report given to primary RN on unit. Vitals stable post tx.

## 2015-08-29 NOTE — Progress Notes (Signed)
Pt is leaving hospital AMA.  Pt has been educated on risks of leaving hospital when not medically cleared.   Dr Wyline Copas is aware and pt has signed [place] paperwork.

## 2015-08-31 ENCOUNTER — Inpatient Hospital Stay (HOSPITAL_COMMUNITY)
Admission: EM | Admit: 2015-08-31 | Discharge: 2015-08-31 | DRG: 291 | Discharge status: Against Medical Advice | Payer: Medicare Other | Attending: Internal Medicine | Admitting: Internal Medicine

## 2015-08-31 ENCOUNTER — Encounter (HOSPITAL_COMMUNITY): Payer: Self-pay | Admitting: Emergency Medicine

## 2015-08-31 DIAGNOSIS — F17210 Nicotine dependence, cigarettes, uncomplicated: Secondary | ICD-10-CM | POA: Diagnosis present

## 2015-08-31 DIAGNOSIS — Z841 Family history of disorders of kidney and ureter: Secondary | ICD-10-CM

## 2015-08-31 DIAGNOSIS — N186 End stage renal disease: Secondary | ICD-10-CM | POA: Diagnosis not present

## 2015-08-31 DIAGNOSIS — F250 Schizoaffective disorder, bipolar type: Secondary | ICD-10-CM | POA: Diagnosis present

## 2015-08-31 DIAGNOSIS — E8770 Fluid overload, unspecified: Secondary | ICD-10-CM | POA: Diagnosis present

## 2015-08-31 DIAGNOSIS — I132 Hypertensive heart and chronic kidney disease with heart failure and with stage 5 chronic kidney disease, or end stage renal disease: Principal | ICD-10-CM | POA: Diagnosis present

## 2015-08-31 DIAGNOSIS — Z992 Dependence on renal dialysis: Secondary | ICD-10-CM | POA: Diagnosis not present

## 2015-08-31 DIAGNOSIS — I120 Hypertensive chronic kidney disease with stage 5 chronic kidney disease or end stage renal disease: Secondary | ICD-10-CM | POA: Diagnosis not present

## 2015-08-31 DIAGNOSIS — I5022 Chronic systolic (congestive) heart failure: Secondary | ICD-10-CM | POA: Diagnosis not present

## 2015-08-31 DIAGNOSIS — Z9119 Patient's noncompliance with other medical treatment and regimen: Secondary | ICD-10-CM

## 2015-08-31 LAB — CBC
HEMATOCRIT: 23.6 % — AB (ref 36.0–46.0)
HEMOGLOBIN: 7.8 g/dL — AB (ref 12.0–15.0)
MCH: 29 pg (ref 26.0–34.0)
MCHC: 33.1 g/dL (ref 30.0–36.0)
MCV: 87.7 fL (ref 78.0–100.0)
Platelets: 176 10*3/uL (ref 150–400)
RBC: 2.69 MIL/uL — ABNORMAL LOW (ref 3.87–5.11)
RDW: 16 % — AB (ref 11.5–15.5)
WBC: 7.6 10*3/uL (ref 4.0–10.5)

## 2015-08-31 MED ORDER — EPOETIN ALFA 10000 UNIT/ML IJ SOLN
10000.0000 [IU] | Freq: Once | INTRAMUSCULAR | Status: AC
  Administered 2015-08-31: 10000 [IU] via INTRAVENOUS

## 2015-08-31 MED ORDER — ALTEPLASE 2 MG IJ SOLR
2.0000 mg | Freq: Once | INTRAMUSCULAR | Status: DC | PRN
  Filled 2015-08-31: qty 2

## 2015-08-31 MED ORDER — ACETAMINOPHEN 650 MG RE SUPP: 650.0000 mg | Freq: Four times a day (QID) | RECTAL | Status: DC | PRN

## 2015-08-31 MED ORDER — LIDOCAINE-PRILOCAINE 2.5-2.5 % EX CREA: 1.0000 "application " | TOPICAL_CREAM | CUTANEOUS | Status: DC | PRN

## 2015-08-31 MED ORDER — DIPHENHYDRAMINE HCL 25 MG PO CAPS: 25.0000 mg | ORAL_CAPSULE | Freq: Four times a day (QID) | ORAL | Status: DC | PRN

## 2015-08-31 MED ORDER — EPOETIN ALFA 10000 UNIT/ML IJ SOLN
INTRAMUSCULAR | Status: AC
  Administered 2015-08-31: 10000 [IU] via INTRAVENOUS
  Filled 2015-08-31: qty 1

## 2015-08-31 MED ORDER — SEVELAMER CARBONATE 800 MG PO TABS
3200.0000 mg | ORAL_TABLET | Freq: Three times a day (TID) | ORAL | Status: DC
  Filled 2015-08-31 (×10): qty 4

## 2015-08-31 MED ORDER — PENTAFLUOROPROP-TETRAFLUOROETH EX AERO: 1.0000 "application " | INHALATION_SPRAY | CUTANEOUS | Status: DC | PRN

## 2015-08-31 MED ORDER — SODIUM CHLORIDE 0.9 % IV SOLN: 100.0000 mL | INTRAVENOUS | Status: DC | PRN

## 2015-08-31 MED ORDER — PREDNISONE 20 MG PO TABS: 20.0000 mg | ORAL_TABLET | Freq: Every day | ORAL | Status: DC

## 2015-08-31 MED ORDER — METOPROLOL TARTRATE 50 MG PO TABS: 50.0000 mg | ORAL_TABLET | Freq: Two times a day (BID) | ORAL | Status: DC

## 2015-08-31 MED ORDER — FUROSEMIDE 80 MG PO TABS: 120.0000 mg | ORAL_TABLET | Freq: Every day | ORAL | Status: DC

## 2015-08-31 MED ORDER — LIDOCAINE HCL (PF) 1 % IJ SOLN
INTRAMUSCULAR | Status: AC
  Administered 2015-08-31: 5 mL via INTRADERMAL
  Filled 2015-08-31: qty 5

## 2015-08-31 MED ORDER — LIDOCAINE HCL (PF) 1 % IJ SOLN
5.0000 mL | INTRAMUSCULAR | Status: DC | PRN
  Administered 2015-08-31: 5 mL via INTRADERMAL

## 2015-08-31 MED ORDER — ACETAMINOPHEN 325 MG PO TABS
650.0000 mg | ORAL_TABLET | Freq: Four times a day (QID) | ORAL | Status: DC | PRN
  Administered 2015-08-31: 650 mg via ORAL
  Filled 2015-08-31: qty 2

## 2015-08-31 MED ORDER — HEPARIN SODIUM (PORCINE) 5000 UNIT/ML IJ SOLN: 5000.0000 [IU] | Freq: Three times a day (TID) | INTRAMUSCULAR | Status: DC

## 2015-08-31 MED ORDER — HYDRALAZINE HCL 10 MG PO TABS
10.0000 mg | ORAL_TABLET | Freq: Three times a day (TID) | ORAL | Status: DC
  Filled 2015-08-31 (×9): qty 1
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
## 2015-08-31 NOTE — Procedures (Signed)
   HEMODIALYSIS TREATMENT NOTE:  4 hour heparin-free dialysis completed via right upper arm aVF (15g/antegrade). Goal met: 4 liters removed.  Difficult cannulation of tortuous AVF with limited cannulation sites.  Hgb 7.8 reported to Dr. Lowanda Foster.  Epogen 10,000 units given.  All blood was returned and hemostasis was achieved within 15 minutes. Pt signed off early/AMA 15 minutes early.  "I have to catch my ride."  Report given to Vista Deck, RN.  Rockwell Alexandria, RN, CDN

## 2015-08-31 NOTE — Progress Notes (Signed)
  NURSING NOTE:  Pt was transported back to unit 3A after hemodialysis.  Pt's transport driver was waiting at nursing station and, as pt was passing by, she said to her driver, "I'm almost ready, I apologize."  As dialysis RN was seeking out primary RN to given report, pt left hospital AMA without signing waiver.  Rockwell Alexandria, RN, CDN

## 2015-08-31 NOTE — Progress Notes (Signed)
Pt received dialysis and left without signing AMA paper. MD E-Paged.

## 2015-08-31 NOTE — ED Notes (Signed)
Pt requesting dialysis. Last dialyzed on Wed. Pt c/o bilateral lower extremity edema.

## 2015-09-01 NOTE — Discharge Summary (Signed)
Physician Discharge Summary  Sara [name] [name] DOB: Jun 26, 1980 DOA: 08/31/2015  PCP: MCDIARMID,TODD D, MD  Admit date: 08/31/2015 Discharge date: 09/01/2015  PATIENT LEFT AGAINST MEDICAL ADVISE  Discharge Diagnoses:  Principal Problem:   ESRD (end stage renal disease) ([place]) Active Problems:   Volume overload   Chronic systolic CHF (congestive heart failure) ([place])   There were no vitals filed for this visit.  History of present illness:  Please review dictated H and P from 5/26 for details. Briefly, 35 y.o. female with medical history significant of ESRD, HTN, bipolar, schizophrenia, lupus who presents to the ED with increased swelling and seeking dialysis.   Hospital Course:  Patient was admitted to the medical floor. Nephrology was consulted. Patient underwent dialysis on 08/31/2015 with no reported issues. Before being re-evaluated, patient elected to leave the hospital without signing AMA form. Patient left against medical advise  Consultations:  Nephrology  Discharge Exam: Filed Vitals:   08/31/15 1700 08/31/15 1730 08/31/15 1800 08/31/15 1820  BP: 159/85 150/84 154/82 164/90  Pulse: 101 103 110 105  Temp:    98.1 F (36.7 C)  TempSrc:    Oral  Resp:    18  SpO2:        General: awake, in nad Cardiovascular: regular, s1, s2 Respiratory: normal resp effort, no wheezing  Discharge Instructions     Medication List    ASK your doctor about these medications        acetaminophen 500 MG tablet  Commonly known as:  TYLENOL  Take 1,000 mg by mouth every 6 (six) hours as needed for moderate pain. Reported on 06/12/2015     furosemide 40 MG tablet  Commonly known as:  LASIX  Take 3 tablets (120 mg total) by mouth daily.     hydrALAZINE 10 MG tablet  Commonly known as:  APRESOLINE  Take 1 tablet (10 mg total) by mouth 3 (three) times daily.     metoprolol 50 MG tablet  Commonly known as:  LOPRESSOR  Take 1 tablet (50 mg total) by mouth 2 (two)  times daily.     predniSONE 20 MG tablet  Commonly known as:  DELTASONE  Take 1 tablet (20 mg total) by mouth daily with breakfast.     sevelamer carbonate 800 MG tablet  Commonly known as:  RENVELA  Take 4 tablets (3,200 mg total) by mouth 3 (three) times daily with meals.       Allergies  Allergen Reactions  . Ativan [Lorazepam] Swelling and Other (See Comments)    Dysarthria(patient has difficulty speaking and slurred speech); denies swelling, itching, pain, or numbness.  [name] [Ziprasidone Hcl] Itching and Swelling    Tongue swelling  . Keflex [Cephalexin] Swelling and Other (See Comments)    Tongue swelling. Can't talk   . Haldol [Haloperidol Lactate] Swelling    Tongue swelling. 05/31/15 - MD ok with giving as pt has tolerated in the past Pt can take benadryl.  . Other Itching    wool      The results of significant diagnostics from this hospitalization (including imaging, microbiology, ancillary and laboratory) are listed below for reference.    Significant Diagnostic Studies: Dg Chest 2 View  08/22/2015  CLINICAL DATA:  Shortness of breath and chest pain EXAM: CHEST  2 VIEW COMPARISON:  08/11/2015 FINDINGS: Cardiomegaly with congested and cephalized vessels, fissural thickening, and trace effusions. No consolidation. Stable aortic and hilar contours. IMPRESSION: Chronic cardiomegaly, venous congestion, and trace effusions. Electronically Signed  By: Monte Fantasia M.D.   On: 08/22/2015 09:52   Dg Chest 2 View  08/11/2015  CLINICAL DATA:  Shortness of breath EXAM: CHEST  2 VIEW COMPARISON:  07/08/2015 FINDINGS: Chronic cardiopericardial enlargement and aortic tortuosity. Trace effusions and congested appearing vessels. No overt edema. Negative for pneumonia. IMPRESSION: Cardiomegaly, venous congestion, and trace effusions. Electronically Signed   By: Monte Fantasia M.D.   On: 08/11/2015 12:52    Microbiology: No results found for this or any previous visit (from the  past 240 hour(s)).   Labs: Basic Metabolic Panel:  Recent Labs Lab 08/28/15 1508  NA 134*  K 4.7  CL 97*  CO2 18*  GLUCOSE 137*  BUN 123*  CREATININE 17.14*  CALCIUM 8.9   Liver Function Tests: No results for input(s): AST, ALT, ALKPHOS, BILITOT, PROT, ALBUMIN in the last 168 hours. No results for input(s): LIPASE, AMYLASE in the last 168 hours. No results for input(s): AMMONIA in the last 168 hours. CBC:  Recent Labs Lab 08/28/15 1508 08/31/15 1447  WBC 7.8 7.6  NEUTROABS 5.6  --   HGB 8.5* 7.8*  HCT 25.4* 23.6*  MCV 85.8 87.7  PLT 202 176   Cardiac Enzymes: No results for input(s): CKTOTAL, CKMB, CKMBINDEX, TROPONINI in the last 168 hours. BNP: BNP (last 3 results)  Recent Labs  09/11/14 0815  BNP 1694.9*    ProBNP (last 3 results) No results for input(s): PROBNP in the last 8760 hours.  CBG: No results for input(s): GLUCAP in the last 168 hours.   Signed:  Alizee Maple K  [facility] 09/01/2015, 8:51 AM

## 2015-09-03 ENCOUNTER — Encounter (HOSPITAL_COMMUNITY): Payer: Self-pay | Admitting: Emergency Medicine

## 2015-09-03 ENCOUNTER — Emergency Department (HOSPITAL_COMMUNITY)
Admission: EM | Admit: 2015-09-03 | Discharge: 2015-09-03 | Discharge status: Against Medical Advice | Payer: Medicare Other | Attending: Emergency Medicine | Admitting: Emergency Medicine

## 2015-09-03 DIAGNOSIS — F250 Schizoaffective disorder, bipolar type: Secondary | ICD-10-CM | POA: Insufficient documentation

## 2015-09-03 DIAGNOSIS — Z992 Dependence on renal dialysis: Secondary | ICD-10-CM | POA: Diagnosis not present

## 2015-09-03 DIAGNOSIS — L723 Sebaceous cyst: Secondary | ICD-10-CM | POA: Diagnosis not present

## 2015-09-03 DIAGNOSIS — I120 Hypertensive chronic kidney disease with stage 5 chronic kidney disease or end stage renal disease: Secondary | ICD-10-CM | POA: Diagnosis not present

## 2015-09-03 DIAGNOSIS — M7989 Other specified soft tissue disorders: Secondary | ICD-10-CM | POA: Diagnosis not present

## 2015-09-03 DIAGNOSIS — N19 Unspecified kidney failure: Secondary | ICD-10-CM

## 2015-09-03 DIAGNOSIS — Z79899 Other long term (current) drug therapy: Secondary | ICD-10-CM | POA: Insufficient documentation

## 2015-09-03 DIAGNOSIS — F17210 Nicotine dependence, cigarettes, uncomplicated: Secondary | ICD-10-CM | POA: Diagnosis not present

## 2015-09-03 DIAGNOSIS — N186 End stage renal disease: Secondary | ICD-10-CM | POA: Insufficient documentation

## 2015-09-03 DIAGNOSIS — I132 Hypertensive heart and chronic kidney disease with heart failure and with stage 5 chronic kidney disease, or end stage renal disease: Secondary | ICD-10-CM | POA: Insufficient documentation

## 2015-09-03 DIAGNOSIS — M545 Low back pain: Secondary | ICD-10-CM | POA: Diagnosis not present

## 2015-09-03 DIAGNOSIS — M25522 Pain in left elbow: Secondary | ICD-10-CM | POA: Diagnosis not present

## 2015-09-03 DIAGNOSIS — I509 Heart failure, unspecified: Secondary | ICD-10-CM | POA: Diagnosis not present

## 2015-09-03 DIAGNOSIS — M79632 Pain in left forearm: Secondary | ICD-10-CM | POA: Diagnosis not present

## 2015-09-03 NOTE — ED Notes (Signed)
Pt states she was last dialyzed on Friday.  C/o back pain, but denies s/s of fluid overload.

## 2015-09-03 NOTE — ED Notes (Signed)
Pt leaving AMA states will be back tomorrow
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
## 2015-09-04 ENCOUNTER — Encounter (HOSPITAL_COMMUNITY): Payer: Self-pay | Admitting: Emergency Medicine

## 2015-09-04 ENCOUNTER — Observation Stay (HOSPITAL_COMMUNITY)
Admission: EM | Admit: 2015-09-04 | Discharge: 2015-09-04 | Disposition: A | Discharge status: Home / Self Care | Payer: Medicare Other | Attending: Internal Medicine | Admitting: Internal Medicine

## 2015-09-04 DIAGNOSIS — I132 Hypertensive heart and chronic kidney disease with heart failure and with stage 5 chronic kidney disease, or end stage renal disease: Principal | ICD-10-CM | POA: Insufficient documentation

## 2015-09-04 DIAGNOSIS — N186 End stage renal disease: Secondary | ICD-10-CM

## 2015-09-04 DIAGNOSIS — Z992 Dependence on renal dialysis: Secondary | ICD-10-CM | POA: Diagnosis not present

## 2015-09-04 DIAGNOSIS — F17210 Nicotine dependence, cigarettes, uncomplicated: Secondary | ICD-10-CM | POA: Diagnosis not present

## 2015-09-04 DIAGNOSIS — R069 Unspecified abnormalities of breathing: Secondary | ICD-10-CM | POA: Diagnosis not present

## 2015-09-04 DIAGNOSIS — I5022 Chronic systolic (congestive) heart failure: Secondary | ICD-10-CM | POA: Diagnosis present

## 2015-09-04 DIAGNOSIS — I509 Heart failure, unspecified: Secondary | ICD-10-CM | POA: Diagnosis not present

## 2015-09-04 DIAGNOSIS — F250 Schizoaffective disorder, bipolar type: Secondary | ICD-10-CM | POA: Insufficient documentation

## 2015-09-04 DIAGNOSIS — R0602 Shortness of breath: Secondary | ICD-10-CM | POA: Diagnosis present

## 2015-09-04 DIAGNOSIS — Z9119 Patient's noncompliance with other medical treatment and regimen: Secondary | ICD-10-CM | POA: Diagnosis not present

## 2015-09-04 DIAGNOSIS — R000 Tachycardia, unspecified: Secondary | ICD-10-CM | POA: Diagnosis not present

## 2015-09-04 DIAGNOSIS — I120 Hypertensive chronic kidney disease with stage 5 chronic kidney disease or end stage renal disease: Secondary | ICD-10-CM | POA: Diagnosis not present

## 2015-09-04 DIAGNOSIS — Z79899 Other long term (current) drug therapy: Secondary | ICD-10-CM | POA: Insufficient documentation

## 2015-09-04 DIAGNOSIS — Z91199 Patient's noncompliance with other medical treatment and regimen due to unspecified reason: Secondary | ICD-10-CM

## 2015-09-04 LAB — CBC
HCT: 23.7 % — ABNORMAL LOW (ref 36.0–46.0)
Hemoglobin: 7.7 g/dL — ABNORMAL LOW (ref 12.0–15.0)
MCH: 28.7 pg (ref 26.0–34.0)
MCHC: 32.5 g/dL (ref 30.0–36.0)
MCV: 88.4 fL (ref 78.0–100.0)
PLATELETS: 178 10*3/uL (ref 150–400)
RBC: 2.68 MIL/uL — AB (ref 3.87–5.11)
RDW: 16.6 % — AB (ref 11.5–15.5)
WBC: 7.6 10*3/uL (ref 4.0–10.5)

## 2015-09-04 LAB — I-STAT CHEM 8, ED
BUN: 87 mg/dL — AB (ref 6–20)
CALCIUM ION: 1.05 mmol/L — AB (ref 1.12–1.23)
CHLORIDE: 101 mmol/L (ref 101–111)
Creatinine, Ser: 12.6 mg/dL — ABNORMAL HIGH (ref 0.44–1.00)
Glucose, Bld: 93 mg/dL (ref 65–99)
HEMATOCRIT: 24 % — AB (ref 36.0–46.0)
Hemoglobin: 8.2 g/dL — ABNORMAL LOW (ref 12.0–15.0)
POTASSIUM: 5.4 mmol/L — AB (ref 3.5–5.1)
SODIUM: 134 mmol/L — AB (ref 135–145)
TCO2: 25 mmol/L (ref 0–100)

## 2015-09-04 LAB — CREATININE, SERUM
CREATININE: 12.83 mg/dL — AB (ref 0.44–1.00)
GFR calc non Af Amer: 3 mL/min — ABNORMAL LOW (ref >60)
GFR, EST AFRICAN AMERICAN: 4 mL/min — AB (ref >60)

## 2015-09-04 MED ORDER — FUROSEMIDE 80 MG PO TABS: 120.0000 mg | ORAL_TABLET | Freq: Every day | ORAL | Status: DC

## 2015-09-04 MED ORDER — METOPROLOL TARTRATE 50 MG PO TABS: 50.0000 mg | ORAL_TABLET | Freq: Two times a day (BID) | ORAL | Status: DC

## 2015-09-04 MED ORDER — ACETAMINOPHEN 650 MG RE SUPP: 650.0000 mg | Freq: Four times a day (QID) | RECTAL | Status: DC | PRN

## 2015-09-04 MED ORDER — LIDOCAINE HCL (PF) 1 % IJ SOLN
5.0000 mL | INTRAMUSCULAR | Status: DC | PRN
  Administered 2015-09-04: 0.5 mL via INTRADERMAL

## 2015-09-04 MED ORDER — ACETAMINOPHEN 325 MG PO TABS
ORAL_TABLET | ORAL | Status: AC
  Administered 2015-09-04: 650 mg via ORAL
  Filled 2015-09-04: qty 2

## 2015-09-04 MED ORDER — HEPARIN SODIUM (PORCINE) 1000 UNIT/ML IJ SOLN
INTRAMUSCULAR | Status: AC
  Administered 2015-09-04: 1000 [IU]
  Filled 2015-09-04: qty 6

## 2015-09-04 MED ORDER — HYDRALAZINE HCL 10 MG PO TABS: 10.0000 mg | ORAL_TABLET | Freq: Three times a day (TID) | ORAL | Status: DC

## 2015-09-04 MED ORDER — SODIUM CHLORIDE 0.9 % IV SOLN: 100.0000 mL | INTRAVENOUS | Status: DC | PRN

## 2015-09-04 MED ORDER — LIDOCAINE-PRILOCAINE 2.5-2.5 % EX CREA: 1.0000 "application " | TOPICAL_CREAM | CUTANEOUS | Status: DC | PRN

## 2015-09-04 MED ORDER — PENTAFLUOROPROP-TETRAFLUOROETH EX AERO
1.0000 | INHALATION_SPRAY | CUTANEOUS | Status: DC | PRN
Start: 2015-09-04 — End: 2015-09-04

## 2015-09-04 MED ORDER — EPOETIN ALFA 10000 UNIT/ML IJ SOLN
10000.0000 [IU] | INTRAMUSCULAR | Status: DC
  Administered 2015-09-04: 10000 [IU] via SUBCUTANEOUS
  Filled 2015-09-04: qty 1

## 2015-09-04 MED ORDER — SEVELAMER CARBONATE 800 MG PO TABS
3200.0000 mg | ORAL_TABLET | Freq: Three times a day (TID) | ORAL | Status: DC
  Administered 2015-09-04: 3200 mg via ORAL
  Filled 2015-09-04 (×6): qty 4

## 2015-09-04 MED ORDER — DIPHENHYDRAMINE HCL 25 MG PO CAPS
25.0000 mg | ORAL_CAPSULE | ORAL | Status: DC | PRN
  Administered 2015-09-04: 25 mg via ORAL

## 2015-09-04 MED ORDER — ACETAMINOPHEN 325 MG PO TABS
650.0000 mg | ORAL_TABLET | Freq: Four times a day (QID) | ORAL | Status: DC | PRN
  Administered 2015-09-04: 650 mg via ORAL

## 2015-09-04 MED ORDER — HEPARIN SODIUM (PORCINE) 5000 UNIT/ML IJ SOLN: 5000.0000 [IU] | Freq: Three times a day (TID) | INTRAMUSCULAR | Status: DC

## 2015-09-04 MED ORDER — EPOETIN ALFA 10000 UNIT/ML IJ SOLN
INTRAMUSCULAR | Status: AC
  Administered 2015-09-04: 10000 [IU] via SUBCUTANEOUS
  Filled 2015-09-04: qty 1

## 2015-09-04 MED ORDER — LIDOCAINE HCL (PF) 1 % IJ SOLN
INTRAMUSCULAR | Status: AC
  Administered 2015-09-04: 0.5 mL via INTRADERMAL
  Filled 2015-09-04: qty 5

## 2015-09-04 MED ORDER — DIPHENHYDRAMINE HCL 25 MG PO CAPS
ORAL_CAPSULE | ORAL | Status: AC
  Administered 2015-09-04: 25 mg via ORAL
  Filled 2015-09-04: qty 1

## 2015-09-04 MED ORDER — PREDNISONE 20 MG PO TABS: 20.0000 mg | ORAL_TABLET | Freq: Every day | ORAL | Status: DC

## 2015-09-04 NOTE — Progress Notes (Signed)
Pt came back from dialysis and wanted to leave AMA.  Pt signed AMA papers at 1605. Dr. Wyline Copas notified of pt leaving.

## 2015-09-04 NOTE — ED Notes (Signed)
Gave patient ginger ale and graham crackers as requested.

## 2015-09-04 NOTE — Procedures (Signed)
   HEMODIALYSIS TREATMENT NOTE:  4 hour low-heparin dialysis completed via right upper arm AVF.  4.5 hour treatment ordered, but pt signed off early/AMA with 30 minutes remaining. 4 liters removed.  All blood was returned and hemostasis was achieved within 15 minutes. Report called to Donavan Foil, RN.  Rockwell Alexandria, RN, CDN

## 2015-09-04 NOTE — Discharge Summary (Signed)
Physician Discharge Summary  Sara [name] [id number] DOB: May 26, 1980 DOA: 09/04/2015  PCP: MCDIARMID,TODD D, MD  Admit date: 09/04/2015 Discharge date: 09/04/2015  PATIENT LEFT AGAINST MEDICAL ADVISE  Discharge Diagnoses:  Principal Problem:   ESRD (end stage renal disease) on dialysis [facility]) Active Problems:   Schizoaffective disorder, bipolar type ([place])   Chronic systolic CHF (congestive heart failure) ([place])   Encounter for hemodialysis for end-stage renal disease ([place])   ESRD (end stage renal disease) ([place])   Noncompliance   Filed Weights   09/04/15 0333 09/04/15 1050 09/04/15 1151  Weight: 68.04 kg (150 lb) 95.1 kg (209 lb 10.5 oz) 90.266 kg (199 lb)    History of present illness:  Please review dictated H and P from this AM for details. Briefly, 35 y.o. female with medical history significant of ESRD, HTN, bipolar, schizophrenia, lupus, noncompliance who presents to the ED seeking dialysis  Hospital Course:  Patient was admitted to the medical floor. Nephrology was consulted. Patient underwent dialysis on the day of admission (5/30) with no reported issues. Following dialysis and before returning to the floor, patient elected to sign out against medical advise.  Consultations:  Nephrology  Discharge Exam: Filed Vitals:   09/04/15 1400 09/04/15 1430 09/04/15 1500 09/04/15 1524  BP: 132/79 141/80 144/83 130/82  Pulse: 102 104 103 102  Temp:      TempSrc:      Resp:    16  Height:      Weight:      SpO2:        General: awake, in nad Cardiovascular: regular, s1, s2 Respiratory: normal resp effort, no wheezing  Discharge Instructions     Medication List    ASK your doctor about these medications        acetaminophen 500 MG tablet  Commonly known as:  TYLENOL  Take 1,000 mg by mouth every 6 (six) hours as needed for moderate pain. Reported on 06/12/2015     furosemide 40 MG tablet  Commonly known as:  LASIX  Take 3 tablets (120 mg total) by mouth  daily.     hydrALAZINE 10 MG tablet  Commonly known as:  APRESOLINE  Take 1 tablet (10 mg total) by mouth 3 (three) times daily.     metoprolol 50 MG tablet  Commonly known as:  LOPRESSOR  Take 1 tablet (50 mg total) by mouth 2 (two) times daily.     predniSONE 20 MG tablet  Commonly known as:  DELTASONE  Take 1 tablet (20 mg total) by mouth daily with breakfast.     sevelamer carbonate 800 MG tablet  Commonly known as:  RENVELA  Take 4 tablets (3,200 mg total) by mouth 3 (three) times daily with meals.       Allergies  Allergen Reactions  . Ativan [Lorazepam] Swelling and Other (See Comments)    Dysarthria(patient has difficulty speaking and slurred speech); denies swelling, itching, pain, or numbness.  [name] [Ziprasidone Hcl] Itching and Swelling    Tongue swelling  . Keflex [Cephalexin] Swelling and Other (See Comments)    Tongue swelling. Can't talk   . Haldol [Haloperidol Lactate] Swelling    Tongue swelling. 05/31/15 - MD ok with giving as pt has tolerated in the past Pt can take benadryl.  . Other Itching    wool      The results of significant diagnostics from this hospitalization (including imaging, microbiology, ancillary and laboratory) are listed below for reference.    Significant Diagnostic Studies:  Dg Chest 2 View  08/22/2015  CLINICAL DATA:  Shortness of breath and chest pain EXAM: CHEST  2 VIEW COMPARISON:  08/11/2015 FINDINGS: Cardiomegaly with congested and cephalized vessels, fissural thickening, and trace effusions. No consolidation. Stable aortic and hilar contours. IMPRESSION: Chronic cardiomegaly, venous congestion, and trace effusions. Electronically Signed   By: Monte Fantasia M.D.   On: 08/22/2015 09:52   Dg Chest 2 View  08/11/2015  CLINICAL DATA:  Shortness of breath EXAM: CHEST  2 VIEW COMPARISON:  07/08/2015 FINDINGS: Chronic cardiopericardial enlargement and aortic tortuosity. Trace effusions and congested appearing vessels. No overt  edema. Negative for pneumonia. IMPRESSION: Cardiomegaly, venous congestion, and trace effusions. Electronically Signed   By: Monte Fantasia M.D.   On: 08/11/2015 12:52    Microbiology: No results found for this or any previous visit (from the past 240 hour(s)).   Labs: Basic Metabolic Panel:  Recent Labs Lab 09/04/15 0416  NA 134*  K 5.4*  CL 101  GLUCOSE 93  BUN 87*  CREATININE 12.60*   Liver Function Tests: No results for input(s): AST, ALT, ALKPHOS, BILITOT, PROT, ALBUMIN in the last 168 hours. No results for input(s): LIPASE, AMYLASE in the last 168 hours. No results for input(s): AMMONIA in the last 168 hours. CBC:  Recent Labs Lab 08/31/15 1447 09/04/15 0416 09/04/15 1006  WBC 7.6  --  7.6  HGB 7.8* 8.2* 7.7*  HCT 23.6* 24.0* 23.7*  MCV 87.7  --  88.4  PLT 176  --  178   Cardiac Enzymes: No results for input(s): CKTOTAL, CKMB, CKMBINDEX, TROPONINI in the last 168 hours. BNP: BNP (last 3 results)  Recent Labs  09/11/14 0815  BNP 1694.9*    ProBNP (last 3 results) No results for input(s): PROBNP in the last 8760 hours.  CBG: No results for input(s): GLUCAP in the last 168 hours.   Signed:  Denson Niccoli, Orpah Melter  [facility] 09/04/2015, 4:23 PM

## 2015-09-04 NOTE — ED Notes (Signed)
Patient states she woke up approximately 1 hour ago "gasping for breath." States she last had dialysis on Friday.

## 2015-09-04 NOTE — ED Notes (Signed)
Pt refuses IV access  
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
## 2015-09-04 NOTE — Progress Notes (Signed)
Patient left against medical advice this afternoon on return from dialysis per nurse report. Text-paged Dr Wyline Copas to notify this afternoon. Donavan Foil, RN
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
## 2015-09-08 ENCOUNTER — Encounter (HOSPITAL_COMMUNITY): Payer: Self-pay | Admitting: Emergency Medicine

## 2015-09-08 ENCOUNTER — Observation Stay (HOSPITAL_COMMUNITY)
Admission: EM | Admit: 2015-09-08 | Discharge: 2015-09-08 | Disposition: A | Discharge status: Home / Self Care | Payer: Medicare Other | Attending: Internal Medicine | Admitting: Internal Medicine

## 2015-09-08 DIAGNOSIS — F209 Schizophrenia, unspecified: Secondary | ICD-10-CM | POA: Insufficient documentation

## 2015-09-08 DIAGNOSIS — Z992 Dependence on renal dialysis: Secondary | ICD-10-CM

## 2015-09-08 DIAGNOSIS — M7989 Other specified soft tissue disorders: Secondary | ICD-10-CM | POA: Diagnosis present

## 2015-09-08 DIAGNOSIS — R600 Localized edema: Principal | ICD-10-CM | POA: Insufficient documentation

## 2015-09-08 DIAGNOSIS — I110 Hypertensive heart disease with heart failure: Secondary | ICD-10-CM | POA: Diagnosis not present

## 2015-09-08 DIAGNOSIS — N186 End stage renal disease: Secondary | ICD-10-CM

## 2015-09-08 DIAGNOSIS — R0602 Shortness of breath: Secondary | ICD-10-CM | POA: Diagnosis not present

## 2015-09-08 DIAGNOSIS — F319 Bipolar disorder, unspecified: Secondary | ICD-10-CM | POA: Insufficient documentation

## 2015-09-08 DIAGNOSIS — F17210 Nicotine dependence, cigarettes, uncomplicated: Secondary | ICD-10-CM | POA: Insufficient documentation

## 2015-09-08 DIAGNOSIS — Z79899 Other long term (current) drug therapy: Secondary | ICD-10-CM | POA: Diagnosis not present

## 2015-09-08 DIAGNOSIS — R609 Edema, unspecified: Secondary | ICD-10-CM | POA: Diagnosis not present

## 2015-09-08 DIAGNOSIS — I509 Heart failure, unspecified: Secondary | ICD-10-CM | POA: Diagnosis not present

## 2015-09-08 LAB — BASIC METABOLIC PANEL
Anion gap: 14 (ref 5–15)
BUN: 93 mg/dL — AB (ref 6–20)
CO2: 22 mmol/L (ref 22–32)
Calcium: 9 mg/dL (ref 8.9–10.3)
Chloride: 99 mmol/L — ABNORMAL LOW (ref 101–111)
Creatinine, Ser: 12.33 mg/dL — ABNORMAL HIGH (ref 0.44–1.00)
GFR calc Af Amer: 4 mL/min — ABNORMAL LOW (ref >60)
GFR, EST NON AFRICAN AMERICAN: 3 mL/min — AB (ref >60)
GLUCOSE: 121 mg/dL — AB (ref 65–99)
POTASSIUM: 5.3 mmol/L — AB (ref 3.5–5.1)
Sodium: 135 mmol/L (ref 135–145)

## 2015-09-08 LAB — CBC
HEMATOCRIT: 26.1 % — AB (ref 36.0–46.0)
Hemoglobin: 8.6 g/dL — ABNORMAL LOW (ref 12.0–15.0)
MCH: 29.4 pg (ref 26.0–34.0)
MCHC: 33 g/dL (ref 30.0–36.0)
MCV: 89.1 fL (ref 78.0–100.0)
Platelets: 190 10*3/uL (ref 150–400)
RBC: 2.93 MIL/uL — ABNORMAL LOW (ref 3.87–5.11)
RDW: 16.9 % — AB (ref 11.5–15.5)
WBC: 7.4 10*3/uL (ref 4.0–10.5)

## 2015-09-08 MED ORDER — METOPROLOL TARTRATE 50 MG PO TABS: 50.0000 mg | ORAL_TABLET | Freq: Two times a day (BID) | ORAL | Status: DC

## 2015-09-08 MED ORDER — PENTAFLUOROPROP-TETRAFLUOROETH EX AERO: 1.0000 "application " | INHALATION_SPRAY | CUTANEOUS | Status: DC | PRN

## 2015-09-08 MED ORDER — LIDOCAINE HCL (PF) 1 % IJ SOLN
5.0000 mL | INTRAMUSCULAR | Status: DC | PRN
  Administered 2015-09-08: 0.5 mL via INTRADERMAL

## 2015-09-08 MED ORDER — SODIUM CHLORIDE 0.9 % IV SOLN: 100.0000 mL | INTRAVENOUS | Status: DC | PRN

## 2015-09-08 MED ORDER — EPOETIN ALFA 10000 UNIT/ML IJ SOLN: 10000.0000 [IU] | INTRAMUSCULAR | Status: DC

## 2015-09-08 MED ORDER — FUROSEMIDE 80 MG PO TABS: 120.0000 mg | ORAL_TABLET | Freq: Every day | ORAL | Status: DC

## 2015-09-08 MED ORDER — LIDOCAINE HCL (PF) 1 % IJ SOLN
INTRAMUSCULAR | Status: AC
  Filled 2015-09-08: qty 5

## 2015-09-08 MED ORDER — PREDNISONE 20 MG PO TABS: 20.0000 mg | ORAL_TABLET | Freq: Every day | ORAL | Status: DC

## 2015-09-08 MED ORDER — ONDANSETRON HCL 4 MG/2ML IJ SOLN: 4.0000 mg | Freq: Three times a day (TID) | INTRAMUSCULAR | Status: DC | PRN

## 2015-09-08 MED ORDER — ACETAMINOPHEN 500 MG PO TABS: 1000.0000 mg | ORAL_TABLET | Freq: Four times a day (QID) | ORAL | Status: DC | PRN

## 2015-09-08 MED ORDER — SEVELAMER CARBONATE 800 MG PO TABS: 3200.0000 mg | ORAL_TABLET | Freq: Three times a day (TID) | ORAL | Status: DC

## 2015-09-08 MED ORDER — HYDRALAZINE HCL 10 MG PO TABS: 10.0000 mg | ORAL_TABLET | Freq: Three times a day (TID) | ORAL | Status: DC

## 2015-09-08 MED ORDER — EPOETIN ALFA 10000 UNIT/ML IJ SOLN
10000.0000 [IU] | INTRAMUSCULAR | Status: DC
  Administered 2015-09-08: 10000 [IU] via INTRAVENOUS

## 2015-09-08 MED ORDER — DIPHENHYDRAMINE HCL 25 MG PO CAPS: 25.0000 mg | ORAL_CAPSULE | Freq: Four times a day (QID) | ORAL | Status: DC | PRN

## 2015-09-08 MED ORDER — TRAMADOL HCL 50 MG PO TABS: 50.0000 mg | ORAL_TABLET | Freq: Two times a day (BID) | ORAL | Status: DC | PRN

## 2015-09-08 MED ORDER — HEPARIN SODIUM (PORCINE) 1000 UNIT/ML IJ SOLN
INTRAMUSCULAR | Status: AC
  Filled 2015-09-08: qty 5

## 2015-09-08 MED ORDER — LIDOCAINE-PRILOCAINE 2.5-2.5 % EX CREA: 1.0000 "application " | TOPICAL_CREAM | CUTANEOUS | Status: DC | PRN

## 2015-09-08 MED ORDER — EPOETIN ALFA 10000 UNIT/ML IJ SOLN
INTRAMUSCULAR | Status: AC
  Filled 2015-09-08: qty 1

## 2015-09-08 NOTE — Discharge Summary (Signed)
Patient left the hospital AMA after her dialysis without being reassessed by physician. No prescriptions were provided.  Sara Mend, MD [facility] Pager: [phone]
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
## 2015-09-08 NOTE — Procedures (Signed)
   HEMODIALYSIS TREATMENT NOTE:  4.5 hour low heparin dialysis completed via right upper arm AVF (15g ante/retrograde). Goal met: 4 liters removed without interruption in ultrafiltration. Epogen 10,000u given IVP near end of HD.  All blood was returned and hemostasis was achieved within 10 minutes. Report given to Vista Deck, RN.  Rockwell Alexandria, RN, CDN

## 2015-09-08 NOTE — Progress Notes (Signed)
Pt has left against medical advice (AMA) without signing [place] form. MD notified and made aware.

## 2015-09-08 NOTE — ED Notes (Signed)
Patient requesting dialysis. Per patient missed dialysis on Thursday due to inability to find a ride. Patient reports shortness of breath and swelling in lower extremities.

## 2015-09-10 ENCOUNTER — Emergency Department (HOSPITAL_COMMUNITY)
Admission: EM | Admit: 2015-09-10 | Discharge: 2015-09-10 | Disposition: A | Discharge status: Home / Self Care | Payer: Medicare Other | Attending: Emergency Medicine | Admitting: Emergency Medicine

## 2015-09-10 ENCOUNTER — Encounter (HOSPITAL_COMMUNITY): Payer: Self-pay

## 2015-09-10 DIAGNOSIS — F17210 Nicotine dependence, cigarettes, uncomplicated: Secondary | ICD-10-CM | POA: Insufficient documentation

## 2015-09-10 DIAGNOSIS — Z79899 Other long term (current) drug therapy: Secondary | ICD-10-CM | POA: Diagnosis not present

## 2015-09-10 DIAGNOSIS — Z4931 Encounter for adequacy testing for hemodialysis: Secondary | ICD-10-CM | POA: Diagnosis present

## 2015-09-10 DIAGNOSIS — N186 End stage renal disease: Secondary | ICD-10-CM | POA: Diagnosis not present

## 2015-09-10 DIAGNOSIS — I1311 Hypertensive heart and chronic kidney disease without heart failure, with stage 5 chronic kidney disease, or end stage renal disease: Secondary | ICD-10-CM | POA: Diagnosis not present

## 2015-09-10 DIAGNOSIS — F209 Schizophrenia, unspecified: Secondary | ICD-10-CM | POA: Diagnosis not present

## 2015-09-10 DIAGNOSIS — I509 Heart failure, unspecified: Secondary | ICD-10-CM | POA: Diagnosis not present

## 2015-09-10 DIAGNOSIS — I120 Hypertensive chronic kidney disease with stage 5 chronic kidney disease or end stage renal disease: Secondary | ICD-10-CM | POA: Diagnosis not present

## 2015-09-10 DIAGNOSIS — F319 Bipolar disorder, unspecified: Secondary | ICD-10-CM | POA: Diagnosis not present

## 2015-09-10 LAB — BASIC METABOLIC PANEL
Anion gap: 13 (ref 5–15)
BUN: 62 mg/dL — AB (ref 6–20)
CALCIUM: 8.9 mg/dL (ref 8.9–10.3)
CO2: 27 mmol/L (ref 22–32)
CREATININE: 8.98 mg/dL — AB (ref 0.44–1.00)
Chloride: 96 mmol/L — ABNORMAL LOW (ref 101–111)
GFR calc Af Amer: 6 mL/min — ABNORMAL LOW (ref >60)
GFR, EST NON AFRICAN AMERICAN: 5 mL/min — AB (ref >60)
GLUCOSE: 114 mg/dL — AB (ref 65–99)
Potassium: 4.4 mmol/L (ref 3.5–5.1)
Sodium: 136 mmol/L (ref 135–145)

## 2015-09-10 NOTE — Discharge Instructions (Signed)
Dialysis °Dialysis is a procedure that replaces some of the work healthy kidneys do. It is done when you lose about 85-90% of your kidney function. It may also be done earlier if your symptoms may be improved by dialysis. During dialysis, wastes, salt, and extra water are removed from the blood, and the levels of certain chemicals in the blood (such as potassium) are maintained. Dialysis is done in sessions. Dialysis sessions are continued until the kidneys get better. If the kidneys cannot get better, such as in end-stage kidney disease, dialysis is continued for life or until you receive a new kidney (kidney transplant). There are two types of dialysis: hemodialysis and peritoneal dialysis. °WHAT IS HEMODIALYSIS?  °Hemodialysis is a type of dialysis in which a machine called a dialyzer is used to filter the blood. Before beginning hemodialysis, you will have surgery to create a site where blood can be removed from the body and returned to the body (vascular access). There are three types of vascular accesses: °· Arteriovenous fistula. To create this type of access, an artery is connected to a vein (usually in the arm). A fistula takes 1-6 months to develop after surgery. If it develops properly, it usually lasts longer than the other types of vascular accesses. It is also less likely to become infected and cause blood clots. °· Arteriovenous graft. To create this type of access, an artery and a vein in the arm are connected with a tube. A graft may be used within 2-3 weeks of surgery. °· A venous catheter. To create this type of access, a thin, flexible tube (catheter) is placed in a large vein in your neck, chest, or groin. A catheter may be used right away. It is usually used as a temporary access when dialysis needs to begin immediately. °During hemodialysis, blood leaves the body through your access. It travels through a tube to the dialyzer, where it is filtered. The blood then returns to your body through  another tube. °Hemodialysis is usually performed by a health care provider at a hospital or dialysis center three times a week. Visits last about 3-4 hours. It may also be performed with the help of another person at home with training.  °WHAT IS PERITONEAL DIALYSIS? °Peritoneal dialysis is a type of dialysis in which the thin lining of the abdomen (peritoneum) is used as a filter. Before beginning peritoneal dialysis, you will have surgery to place a catheter in your abdomen. The catheter will be used to transfer a fluid called dialysate to and from your abdomen. At the start of a session, your abdomen is filled with dialysate. During the session, wastes, salt, and extra water in the blood pass through the peritoneum and into the dialysate. The dialysate is drained from the body at the end of the session. The process of filling and draining the dialysate is called an exchange. Exchanges are repeated until you have used up all the dialysate for the day. °Peritoneal dialysis may be performed by you at home or at almost any other location. It is done every day. You may need up to five exchanges a day. The amount of time the dialysate is in your body between exchanges is called a dwell. The dwell depends on the number of exchanges needed and the characteristics of the peritoneum. It usually varies from 1.5-3 hours. You may go about your day normally between exchanges. Alternately, the exchanges may be done at night while you sleep, using a machine called a cycler. °WHICH TYPE   OF DIALYSIS SHOULD I CHOOSE?  °Both hemodialysis and peritoneal dialysis have advantages and disadvantages. Talk to your health care provider about which type of dialysis would be best for you. Your lifestyle and preferences should be considered along with your medical condition. In some cases, only one type of dialysis may be an option.  °Advantages of hemodialysis °· It is done less often than peritoneal dialysis. °· Someone else can do the  dialysis for you. °· If you go to a dialysis center, your health care provider will be able to recognize any problems right away. °· If you go to a dialysis center, you can interact with others who are having dialysis. This can provide you with emotional support. °Disadvantages of hemodialysis °· Hemodialysis may cause cramps and low blood pressure. It may leave you feeling tired on the days you have the treatment. °· If you go to a dialysis center, you will need to make weekly appointments and work around the center's schedule. °· You will need to take extra care when traveling. If you go to a dialysis center, you will need to make special arrangements to visit a dialysis center near your destination. If you are having treatments at home, you will need to take the dialyzer with you to your destination. °· You will need to avoid more foods than you would need to avoid on peritoneal dialysis. °Advantages of peritoneal dialysis °· It is less likely than hemodialysis to cause cramps and low blood pressure. °· You may do exchanges on your own wherever you are, including when you travel. °· You do not need to avoid as many foods as you do on hemodialysis. °Disadvantages of peritoneal dialysis °· It is done more often than hemodialysis. °· Performing peritoneal dialysis requires you to have dexterity of the hands. You must also be able to lift bags. °· You will have to learn sterilization techniques. You will need to practice them every day to reduce the risk of infection.  °WHAT CHANGES WILL I NEED TO MAKE TO MY DIET DURING DIALYSIS? °Both hemodialysis and peritoneal dialysis require you to make some changes to your diet. For example, you will need to limit your intake of foods high in the minerals phosphorus and potassium. You will also need to limit your fluid intake. Your dietitian can help you plan meals. A good meal plan can improve your dialysis and your health.  °WHAT SHOULD I EXPECT WHEN BEGINNING  DIALYSIS? °Adjusting to the dialysis treatment, schedule, and diet can take some time. You may need to stop working and may not be able to do some of the things you normally do. You may feel anxious or depressed when beginning dialysis. Eventually, many people feel better overall because of dialysis. Some people are able to return to work after making some changes, such as reducing work intensity. °WHERE CAN I FIND MORE INFORMATION?  °· National Kidney Foundation: www.kidney.org °· American Association of Kidney Patients: www.aakp.org °· American Kidney Fund: www.kidneyfund.org °  °This information is not intended to replace advice given to you by your health care provider. Make sure you discuss any questions you have with your health care provider. °  °Document Released: 06/14/2002 Document Revised: 04/14/2014 Document Reviewed: 05/18/2012 °Elsevier Interactive Patient Education ©2016 [employer] ° °

## 2015-09-10 NOTE — ED Notes (Signed)
Pt here for dialysis treatment.  Last treatment was Saturday.  Pt c/o pain in lower back and left knee.

## 2015-09-10 NOTE — ED Notes (Signed)
Upon entering room to discharge patient, pt was not in the bed. Pt was not in any of the bathrooms.
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
## 2015-09-11 ENCOUNTER — Other Ambulatory Visit: Payer: Self-pay

## 2015-09-11 ENCOUNTER — Ambulatory Visit: Payer: Self-pay

## 2015-09-11 NOTE — Patient Outreach (Signed)
[facility]) Care Management  09/11/2015  Sara [name] [birth year] [id number]  RNCM called to follow up. No answer. HIPPA compliant message left.  Plan: await return call and follow up next week if no return call.  Thea Silversmith, RN, MSN, [place] Coordinator Cell: [phone]

## 2015-09-12 ENCOUNTER — Encounter (HOSPITAL_COMMUNITY): Payer: Self-pay | Admitting: Emergency Medicine

## 2015-09-12 ENCOUNTER — Observation Stay (HOSPITAL_COMMUNITY)
Admission: EM | Admit: 2015-09-12 | Discharge: 2015-09-12 | Disposition: A | Discharge status: Home / Self Care | Payer: Medicare Other | Attending: Internal Medicine | Admitting: Internal Medicine

## 2015-09-12 DIAGNOSIS — D638 Anemia in other chronic diseases classified elsewhere: Secondary | ICD-10-CM | POA: Diagnosis not present

## 2015-09-12 DIAGNOSIS — Z79899 Other long term (current) drug therapy: Secondary | ICD-10-CM | POA: Diagnosis not present

## 2015-09-12 DIAGNOSIS — I10 Essential (primary) hypertension: Secondary | ICD-10-CM | POA: Diagnosis not present

## 2015-09-12 DIAGNOSIS — I132 Hypertensive heart and chronic kidney disease with heart failure and with stage 5 chronic kidney disease, or end stage renal disease: Secondary | ICD-10-CM | POA: Diagnosis not present

## 2015-09-12 DIAGNOSIS — Z992 Dependence on renal dialysis: Secondary | ICD-10-CM | POA: Diagnosis not present

## 2015-09-12 DIAGNOSIS — F250 Schizoaffective disorder, bipolar type: Secondary | ICD-10-CM | POA: Diagnosis not present

## 2015-09-12 DIAGNOSIS — Z9119 Patient's noncompliance with other medical treatment and regimen: Secondary | ICD-10-CM

## 2015-09-12 DIAGNOSIS — M329 Systemic lupus erythematosus, unspecified: Secondary | ICD-10-CM | POA: Diagnosis not present

## 2015-09-12 DIAGNOSIS — E8770 Fluid overload, unspecified: Secondary | ICD-10-CM | POA: Diagnosis not present

## 2015-09-12 DIAGNOSIS — Z91199 Patient's noncompliance with other medical treatment and regimen due to unspecified reason: Secondary | ICD-10-CM

## 2015-09-12 DIAGNOSIS — R600 Localized edema: Secondary | ICD-10-CM | POA: Diagnosis not present

## 2015-09-12 DIAGNOSIS — F17210 Nicotine dependence, cigarettes, uncomplicated: Secondary | ICD-10-CM | POA: Insufficient documentation

## 2015-09-12 DIAGNOSIS — I509 Heart failure, unspecified: Secondary | ICD-10-CM | POA: Diagnosis not present

## 2015-09-12 DIAGNOSIS — I120 Hypertensive chronic kidney disease with stage 5 chronic kidney disease or end stage renal disease: Secondary | ICD-10-CM | POA: Diagnosis not present

## 2015-09-12 DIAGNOSIS — N186 End stage renal disease: Secondary | ICD-10-CM | POA: Diagnosis not present

## 2015-09-12 DIAGNOSIS — R609 Edema, unspecified: Secondary | ICD-10-CM

## 2015-09-12 DIAGNOSIS — I5030 Unspecified diastolic (congestive) heart failure: Secondary | ICD-10-CM | POA: Diagnosis present

## 2015-09-12 DIAGNOSIS — R0602 Shortness of breath: Secondary | ICD-10-CM | POA: Insufficient documentation

## 2015-09-12 LAB — I-STAT CHEM 8, ED
BUN: 73 mg/dL — ABNORMAL HIGH (ref 6–20)
CALCIUM ION: 1.15 mmol/L (ref 1.12–1.23)
CHLORIDE: 97 mmol/L — AB (ref 101–111)
CREATININE: 11.3 mg/dL — AB (ref 0.44–1.00)
GLUCOSE: 99 mg/dL (ref 65–99)
HCT: 27 % — ABNORMAL LOW (ref 36.0–46.0)
Hemoglobin: 9.2 g/dL — ABNORMAL LOW (ref 12.0–15.0)
Potassium: 5 mmol/L (ref 3.5–5.1)
Sodium: 135 mmol/L (ref 135–145)
TCO2: 26 mmol/L (ref 0–100)

## 2015-09-12 LAB — CBC
HCT: 25 % — ABNORMAL LOW (ref 36.0–46.0)
Hemoglobin: 8.4 g/dL — ABNORMAL LOW (ref 12.0–15.0)
MCH: 29.9 pg (ref 26.0–34.0)
MCHC: 33.6 g/dL (ref 30.0–36.0)
MCV: 89 fL (ref 78.0–100.0)
PLATELETS: 194 10*3/uL (ref 150–400)
RBC: 2.81 MIL/uL — ABNORMAL LOW (ref 3.87–5.11)
RDW: 17.2 % — AB (ref 11.5–15.5)
WBC: 8.2 10*3/uL (ref 4.0–10.5)

## 2015-09-12 MED ORDER — DIPHENHYDRAMINE HCL 25 MG PO CAPS
25.0000 mg | ORAL_CAPSULE | Freq: Once | ORAL | Status: AC
  Administered 2015-09-12: 25 mg via ORAL

## 2015-09-12 MED ORDER — LIDOCAINE HCL (PF) 1 % IJ SOLN
5.0000 mL | INTRAMUSCULAR | Status: DC | PRN
  Administered 2015-09-12: 0.5 mL via INTRADERMAL

## 2015-09-12 MED ORDER — LIDOCAINE-PRILOCAINE 2.5-2.5 % EX CREA
1.0000 | TOPICAL_CREAM | CUTANEOUS | Status: DC | PRN
Start: 2015-09-12 — End: 2015-09-12

## 2015-09-12 MED ORDER — SODIUM CHLORIDE 0.9 % IV SOLN: 100.0000 mL | INTRAVENOUS | Status: DC | PRN

## 2015-09-12 MED ORDER — HEPARIN SODIUM (PORCINE) 1000 UNIT/ML IJ SOLN
INTRAMUSCULAR | Status: AC
  Administered 2015-09-12: 1000 [IU]
  Filled 2015-09-12: qty 4

## 2015-09-12 MED ORDER — PREDNISONE 20 MG PO TABS: 20.0000 mg | ORAL_TABLET | Freq: Every day | ORAL | Status: DC

## 2015-09-12 MED ORDER — HYDRALAZINE HCL 10 MG PO TABS: 10.0000 mg | ORAL_TABLET | Freq: Three times a day (TID) | ORAL | Status: DC

## 2015-09-12 MED ORDER — EPOETIN ALFA 4000 UNIT/ML IJ SOLN
4000.0000 [IU] | INTRAMUSCULAR | Status: DC
  Administered 2015-09-12: 4000 [IU] via INTRAVENOUS
  Filled 2015-09-12: qty 1

## 2015-09-12 MED ORDER — SEVELAMER CARBONATE 800 MG PO TABS
ORAL_TABLET | ORAL | Status: AC
  Administered 2015-09-12: 3200 mg via ORAL
  Filled 2015-09-12: qty 3

## 2015-09-12 MED ORDER — SEVELAMER CARBONATE 800 MG PO TABS
3200.0000 mg | ORAL_TABLET | Freq: Three times a day (TID) | ORAL | Status: DC
  Administered 2015-09-12: 3200 mg via ORAL

## 2015-09-12 MED ORDER — DIPHENHYDRAMINE HCL 25 MG PO CAPS
ORAL_CAPSULE | ORAL | Status: AC
  Administered 2015-09-12: 25 mg via ORAL
  Filled 2015-09-12: qty 1

## 2015-09-12 MED ORDER — METOPROLOL TARTRATE 50 MG PO TABS: 50.0000 mg | ORAL_TABLET | Freq: Two times a day (BID) | ORAL | Status: DC

## 2015-09-12 MED ORDER — TRAMADOL HCL 50 MG PO TABS
50.0000 mg | ORAL_TABLET | Freq: Once | ORAL | Status: AC
  Administered 2015-09-12: 50 mg via ORAL

## 2015-09-12 MED ORDER — LIDOCAINE HCL (PF) 1 % IJ SOLN
INTRAMUSCULAR | Status: AC
  Administered 2015-09-12: 0.5 mL via INTRADERMAL
  Filled 2015-09-12: qty 5

## 2015-09-12 MED ORDER — ACETAMINOPHEN 500 MG PO TABS: 1000.0000 mg | ORAL_TABLET | Freq: Four times a day (QID) | ORAL | Status: DC | PRN

## 2015-09-12 MED ORDER — PENTAFLUOROPROP-TETRAFLUOROETH EX AERO
1.0000 | INHALATION_SPRAY | CUTANEOUS | Status: DC | PRN
Start: 2015-09-12 — End: 2015-09-12

## 2015-09-12 MED ORDER — TRAMADOL HCL 50 MG PO TABS
ORAL_TABLET | ORAL | Status: AC
  Administered 2015-09-12: 50 mg via ORAL
  Filled 2015-09-12: qty 1

## 2015-09-12 MED ORDER — FUROSEMIDE 80 MG PO TABS: 120.0000 mg | ORAL_TABLET | Freq: Every day | ORAL | Status: DC

## 2015-09-12 MED ORDER — EPOETIN ALFA 4000 UNIT/ML IJ SOLN: 4000.0000 [IU] | INTRAMUSCULAR | Status: DC

## 2015-09-12 NOTE — ED Notes (Signed)
Patient given water at this time.  

## 2015-09-12 NOTE — ED Notes (Signed)
Pt refused IV and CBC. EDP aware and reported as long as Chem8 has resulted and potassium value is known, CBC can wait until dialysis.
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
## 2015-09-12 NOTE — Consult Note (Signed)
Sara [name]: [id number] DOB/AGE: 34-Aug-1982 34 y.o. Primary Care Physician:Sara D, MD Admit date: 09/12/2015 Chief Complaint:  Chief Complaint  Patient presents with  . Needs dialysis    HPI: Pt is 35 year old female with past medical hx of ESRD who was admitted with c/o dyspnea," I need dialysis "  HPI dates back to few days ago started feeling short of breath,and so pt came to ER asking for her dialysis. Pt says " I need my dialysis  ". No c/o fever/cough/chills NO c/o nausea/vomiting/ diarrhea No c/o hematuria Pt was seen  for need of renal replacement therapy.   Past Medical History  Diagnosis Date  . Lupus ([place])     lupus w nephritis  . History of blood transfusion     "this is probably my 3rd" (10/09/2014)  . ESRD (end stage renal disease) on dialysis [facility])     "[name]; [facility]" (10/09/2014)  . Bipolar disorder ([place])     Sara [name] 10/09/2014  . Schizophrenia ([place])     Sara [name] 10/09/2014  . Chronic anemia     Sara [name] 10/09/2014  . CHF (congestive heart failure) ([place])     systolic/notes [id number]  . Acute myopericarditis     hx/notes 10/09/2014  . Psychosis   . Hypertension   . Pregnancy   . Low back pain   . Positive ANA (antinuclear antibody) 08/16/2012  . 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 Tobacco abuse 02/20/2014  . Schizoaffective disorder, bipolar type ([place]) 11/20/2014  . Non-compliant patient         Family History  Problem Relation Age of Onset  . Drug abuse Father   . Kidney disease Father     Social History:  reports that she has been smoking Cigarettes.  She has a 2 pack-year smoking history. She uses smokeless tobacco. She reports that she does not drink alcohol or use illicit drugs.   Allergies:  Allergies  Allergen Reactions  . Ativan [Lorazepam] Swelling and Other (See Comments)    Dysarthria(patient has difficulty speaking and slurred speech); denies swelling, itching, pain, or numbness.  Sara [name]  [Ziprasidone Hcl] Itching and Swelling    Tongue swelling  . Keflex [Cephalexin] Swelling and Other (See Comments)    Tongue swelling. Can't talk   . Haldol [Haloperidol Lactate] Swelling    Tongue swelling. 05/31/15 - MD ok with giving as pt has tolerated in the past Pt can take benadryl.  . Other Itching    wool    Medications Prior to Admission  Medication Sig Dispense Refill  . acetaminophen (TYLENOL) 500 MG tablet Take 1,000 mg by mouth every 6 (six) hours as needed for moderate pain. Reported on 06/12/2015    . clindamycin (CLEOCIN) 150 MG capsule Take 150 mg by mouth 4 (four) times daily.    . furosemide (LASIX) 40 MG tablet Take 3 tablets (120 mg total) by mouth daily. 90 tablet 0  . hydrALAZINE (APRESOLINE) 10 MG tablet Take 1 tablet (10 mg total) by mouth 3 (three) times daily. 21 tablet 0  . metoprolol (LOPRESSOR) 50 MG tablet Take 1 tablet (50 mg total) by mouth 2 (two) times daily. 60 tablet 0  . predniSONE (DELTASONE) 20 MG tablet Take 1 tablet (20 mg total) by mouth daily with breakfast.         [id number] from the symptoms mentioned above,there are no other symptoms referable to all systems reviewed.      Physical Exam: Vital  signs in last 24 hours: Temp:  [99 F (37.2 C)-99.1 F (37.3 C)] 99.1 F (37.3 C) (06/07 0930) Pulse Rate:  [101-104] 101 (06/07 0930) Resp:  [16-18] 16 (06/07 0930) BP: (124-148)/(75-108) 124/75 mmHg (06/07 0930) SpO2:  [98 %-100 %] 98 % (06/07 0930) Weight change:     Intake/Output from previous day:       Physical Exam: General- pt is awake,alert, follows coomands Resp- No acute REsp distress,  decreased bs at bases. CVS- S1S2 regular in rate and rhythm, NO rubs  GIT- BS+, soft, NT, ND EXT- 2+ LE Edema, NO Cyanosis CNS- CN 2-12 grossly intact. Moving all 4 extremities Access- AVF+, aneurysm present    Lab Results:  CBC    Component Value Date/Time   WBC 7.4 09/08/2015 0926   RBC 2.93* 09/08/2015 0926   RBC 2.77*  06/21/2015 1022   HGB 9.2* 09/12/2015 0809   HCT 27.0* 09/12/2015 0809   PLT 190 09/08/2015 0926   MCV 89.1 09/08/2015 0926   MCH 29.4 09/08/2015 0926   MCHC 33.0 09/08/2015 0926   RDW 16.9* 09/08/2015 0926   LYMPHSABS 1.3 08/28/2015 1508   MONOABS 0.8 08/28/2015 1508   EOSABS 0.1 08/28/2015 1508   BASOSABS 0.0 08/28/2015 1508      BMET  Recent Labs  09/10/15 0837 09/12/15 0809  NA 136 135  K 4.4 5.0  CL 96* 97*  CO2 27  --   GLUCOSE 114* 99  BUN 62* 73*  CREATININE 8.98* 11.30*  CALCIUM 8.9  --     MICRO No results found for this or any previous visit (from the past 240 hour(s)).    Lab Results  Component Value Date   PTH 420* 05/26/2015   CALCIUM 8.9 09/10/2015   CAION 1.15 09/12/2015   PHOS 12.0* 08/17/2015      Impression: 1)Renal  ESRD on HD                Pt is not in regular  Schedule sec to her complaince/adherence issues                Will dialyze pt today  2)HTN Bp is not at goal  3)Anemia In ESRD the goal for HGb is 9--11. Pt HGb is notat goal Will keep  on epo   4)CKD Mineral-Bone Disorder PTH acceptable. Secondary Hyperparathyroidism  Present. Phosphorus not at goal.  on binders  5)Psych .  Hx of   psycosis Schizophrenia Primary MD following  6)Electrolytes  Normokalemic  Hyponatremic    Sec to ESRD  7)Acid base Co2 not  at goal Sec to no adherence to HD   Plan:  Will dialyze today Will keep  on epo Will use 2 k bath Will try to take 3 liters off     Tinesha Siegrist S 09/12/2015, 11:35 AM

## 2015-09-12 NOTE — Procedures (Signed)
   HEMODIALYSIS TREATMENT NOTE:  4 hour low heparin dialysis completed via right upper arm AVF (15g ante/retrograde). Goal met: 3 liters removed without interruption in ultrafiltration. All blood was returned and hemostasis was achieved within 10 minutes. Report called to Magda Paganini, Therapist, sports.  Rockwell Alexandria, RN, CDN

## 2015-09-12 NOTE — ED Notes (Signed)
Pt here requesting dialysis, last dialysis was Saturday, pt has generalized swelling.
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
## 2015-09-14 ENCOUNTER — Observation Stay (HOSPITAL_COMMUNITY)
Admission: EM | Admit: 2015-09-14 | Discharge: 2015-09-14 | Disposition: A | Discharge status: Home / Self Care | Payer: Medicare Other | Attending: Internal Medicine | Admitting: Internal Medicine

## 2015-09-14 ENCOUNTER — Encounter (HOSPITAL_COMMUNITY): Payer: Self-pay | Admitting: Cardiology

## 2015-09-14 DIAGNOSIS — I120 Hypertensive chronic kidney disease with stage 5 chronic kidney disease or end stage renal disease: Secondary | ICD-10-CM | POA: Diagnosis not present

## 2015-09-14 DIAGNOSIS — I5022 Chronic systolic (congestive) heart failure: Secondary | ICD-10-CM

## 2015-09-14 DIAGNOSIS — R600 Localized edema: Secondary | ICD-10-CM | POA: Diagnosis not present

## 2015-09-14 DIAGNOSIS — N186 End stage renal disease: Secondary | ICD-10-CM | POA: Diagnosis not present

## 2015-09-14 DIAGNOSIS — I5030 Unspecified diastolic (congestive) heart failure: Secondary | ICD-10-CM | POA: Diagnosis present

## 2015-09-14 DIAGNOSIS — R609 Edema, unspecified: Secondary | ICD-10-CM

## 2015-09-14 DIAGNOSIS — F250 Schizoaffective disorder, bipolar type: Secondary | ICD-10-CM | POA: Diagnosis not present

## 2015-09-14 DIAGNOSIS — F319 Bipolar disorder, unspecified: Secondary | ICD-10-CM | POA: Insufficient documentation

## 2015-09-14 DIAGNOSIS — F209 Schizophrenia, unspecified: Secondary | ICD-10-CM | POA: Diagnosis not present

## 2015-09-14 DIAGNOSIS — Z992 Dependence on renal dialysis: Secondary | ICD-10-CM

## 2015-09-14 DIAGNOSIS — I132 Hypertensive heart and chronic kidney disease with heart failure and with stage 5 chronic kidney disease, or end stage renal disease: Principal | ICD-10-CM | POA: Insufficient documentation

## 2015-09-14 DIAGNOSIS — M329 Systemic lupus erythematosus, unspecified: Secondary | ICD-10-CM | POA: Diagnosis present

## 2015-09-14 DIAGNOSIS — Z79899 Other long term (current) drug therapy: Secondary | ICD-10-CM | POA: Diagnosis not present

## 2015-09-14 DIAGNOSIS — F17210 Nicotine dependence, cigarettes, uncomplicated: Secondary | ICD-10-CM | POA: Insufficient documentation

## 2015-09-14 LAB — I-STAT CHEM 8, ED
BUN: 48 mg/dL — ABNORMAL HIGH (ref 6–20)
Calcium, Ion: 1.14 mmol/L (ref 1.12–1.23)
Chloride: 95 mmol/L — ABNORMAL LOW (ref 101–111)
Creatinine, Ser: 9.2 mg/dL — ABNORMAL HIGH (ref 0.44–1.00)
GLUCOSE: 97 mg/dL (ref 65–99)
HEMATOCRIT: 28 % — AB (ref 36.0–46.0)
HEMOGLOBIN: 9.5 g/dL — AB (ref 12.0–15.0)
POTASSIUM: 4.1 mmol/L (ref 3.5–5.1)
SODIUM: 136 mmol/L (ref 135–145)
TCO2: 28 mmol/L (ref 0–100)

## 2015-09-14 MED ORDER — SODIUM CHLORIDE 0.9 % IV SOLN: 100.0000 mL | INTRAVENOUS | Status: DC | PRN

## 2015-09-14 MED ORDER — DIPHENHYDRAMINE HCL 25 MG PO CAPS
25.0000 mg | ORAL_CAPSULE | Freq: Three times a day (TID) | ORAL | Status: DC | PRN
  Administered 2015-09-14: 25 mg via ORAL

## 2015-09-14 MED ORDER — CLINDAMYCIN HCL 150 MG PO CAPS: 150.0000 mg | ORAL_CAPSULE | Freq: Four times a day (QID) | ORAL | Status: DC

## 2015-09-14 MED ORDER — PENTAFLUOROPROP-TETRAFLUOROETH EX AERO: 1.0000 "application " | INHALATION_SPRAY | CUTANEOUS | Status: DC | PRN

## 2015-09-14 MED ORDER — ACETAMINOPHEN 500 MG PO TABS: 1000.0000 mg | ORAL_TABLET | Freq: Four times a day (QID) | ORAL | Status: DC | PRN

## 2015-09-14 MED ORDER — LIDOCAINE HCL (PF) 1 % IJ SOLN
INTRAMUSCULAR | Status: AC
  Administered 2015-09-14: 0.5 mL via INTRADERMAL
  Filled 2015-09-14: qty 5

## 2015-09-14 MED ORDER — DIPHENHYDRAMINE HCL 25 MG PO CAPS
ORAL_CAPSULE | ORAL | Status: AC
  Administered 2015-09-14: 25 mg via ORAL
  Filled 2015-09-14: qty 1

## 2015-09-14 MED ORDER — HEPARIN SODIUM (PORCINE) 1000 UNIT/ML IJ SOLN
INTRAMUSCULAR | Status: AC
  Administered 2015-09-14: 1800 [IU] via INTRAVENOUS_CENTRAL
  Filled 2015-09-14: qty 6

## 2015-09-14 MED ORDER — TRAMADOL HCL 50 MG PO TABS
50.0000 mg | ORAL_TABLET | Freq: Two times a day (BID) | ORAL | Status: DC | PRN
  Administered 2015-09-14: 50 mg via ORAL

## 2015-09-14 MED ORDER — FUROSEMIDE 40 MG PO TABS: 120.0000 mg | ORAL_TABLET | Freq: Every day | ORAL | Status: DC

## 2015-09-14 MED ORDER — METOPROLOL TARTRATE 50 MG PO TABS: 50.0000 mg | ORAL_TABLET | Freq: Two times a day (BID) | ORAL | Status: DC

## 2015-09-14 MED ORDER — EPOETIN ALFA 10000 UNIT/ML IJ SOLN
10000.0000 [IU] | Freq: Once | INTRAMUSCULAR | Status: AC
  Administered 2015-09-14: 10000 [IU] via INTRAVENOUS

## 2015-09-14 MED ORDER — EPOETIN ALFA 10000 UNIT/ML IJ SOLN
INTRAMUSCULAR | Status: AC
  Administered 2015-09-14: 10000 [IU] via INTRAVENOUS
  Filled 2015-09-14: qty 1

## 2015-09-14 MED ORDER — HYDRALAZINE HCL 10 MG PO TABS: 10.0000 mg | ORAL_TABLET | Freq: Three times a day (TID) | ORAL | Status: DC

## 2015-09-14 MED ORDER — PREDNISONE 20 MG PO TABS: 20.0000 mg | ORAL_TABLET | Freq: Every day | ORAL | Status: DC

## 2015-09-14 MED ORDER — TRAMADOL HCL 50 MG PO TABS
ORAL_TABLET | ORAL | Status: AC
  Administered 2015-09-14: 50 mg via ORAL
  Filled 2015-09-14: qty 1

## 2015-09-14 MED ORDER — LIDOCAINE HCL (PF) 1 % IJ SOLN
5.0000 mL | INTRAMUSCULAR | Status: DC | PRN
  Administered 2015-09-14: 0.5 mL via INTRADERMAL

## 2015-09-14 MED ORDER — HEPARIN SODIUM (PORCINE) 1000 UNIT/ML DIALYSIS
20.0000 [IU]/kg | INTRAMUSCULAR | Status: DC | PRN
  Administered 2015-09-14: 1800 [IU] via INTRAVENOUS_CENTRAL
  Filled 2015-09-14: qty 2

## 2015-09-14 MED ORDER — LIDOCAINE-PRILOCAINE 2.5-2.5 % EX CREA: 1.0000 "application " | TOPICAL_CREAM | CUTANEOUS | Status: DC | PRN

## 2015-09-14 NOTE — Progress Notes (Signed)
Pt returned to unit after dialysis via bed accompanied by nurse techs. Pt stated that she wanted to be discharged from hospital. I walked to pt's room with AMA paper, but pt left room and is no longer on the unit. Pt left before signing AMA paper.

## 2015-09-14 NOTE — ED Notes (Signed)
Here for dialysis.  Also c/o bilateral knee pain.
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
## 2015-09-16 LAB — HEPATITIS B SURFACE ANTIGEN: Hepatitis B Surface Ag: NEGATIVE

## 2015-09-17 ENCOUNTER — Emergency Department (HOSPITAL_COMMUNITY)
Admission: EM | Admit: 2015-09-17 | Discharge: 2015-09-17 | Disposition: A | Discharge status: Home / Self Care | Payer: Medicare Other | Attending: Emergency Medicine | Admitting: Emergency Medicine

## 2015-09-17 ENCOUNTER — Encounter (HOSPITAL_COMMUNITY): Payer: Self-pay | Admitting: Emergency Medicine

## 2015-09-17 ENCOUNTER — Other Ambulatory Visit: Payer: Self-pay

## 2015-09-17 DIAGNOSIS — I5020 Unspecified systolic (congestive) heart failure: Secondary | ICD-10-CM | POA: Insufficient documentation

## 2015-09-17 DIAGNOSIS — Z760 Encounter for issue of repeat prescription: Secondary | ICD-10-CM

## 2015-09-17 DIAGNOSIS — F17210 Nicotine dependence, cigarettes, uncomplicated: Secondary | ICD-10-CM | POA: Diagnosis not present

## 2015-09-17 DIAGNOSIS — N186 End stage renal disease: Secondary | ICD-10-CM

## 2015-09-17 DIAGNOSIS — R52 Pain, unspecified: Secondary | ICD-10-CM | POA: Diagnosis present

## 2015-09-17 DIAGNOSIS — F250 Schizoaffective disorder, bipolar type: Secondary | ICD-10-CM | POA: Insufficient documentation

## 2015-09-17 DIAGNOSIS — M791 Myalgia: Secondary | ICD-10-CM | POA: Diagnosis not present

## 2015-09-17 DIAGNOSIS — Z992 Dependence on renal dialysis: Secondary | ICD-10-CM | POA: Diagnosis not present

## 2015-09-17 DIAGNOSIS — I132 Hypertensive heart and chronic kidney disease with heart failure and with stage 5 chronic kidney disease, or end stage renal disease: Secondary | ICD-10-CM | POA: Diagnosis not present

## 2015-09-17 DIAGNOSIS — I120 Hypertensive chronic kidney disease with stage 5 chronic kidney disease or end stage renal disease: Secondary | ICD-10-CM | POA: Diagnosis not present

## 2015-09-17 LAB — I-STAT CHEM 8, ED
BUN: 47 mg/dL — AB (ref 6–20)
CHLORIDE: 96 mmol/L — AB (ref 101–111)
CREATININE: 9.6 mg/dL — AB (ref 0.44–1.00)
Calcium, Ion: 1.11 mmol/L — ABNORMAL LOW (ref 1.12–1.23)
GLUCOSE: 115 mg/dL — AB (ref 65–99)
HCT: 27 % — ABNORMAL LOW (ref 36.0–46.0)
Hemoglobin: 9.2 g/dL — ABNORMAL LOW (ref 12.0–15.0)
POTASSIUM: 4.1 mmol/L (ref 3.5–5.1)
Sodium: 137 mmol/L (ref 135–145)
TCO2: 26 mmol/L (ref 0–100)

## 2015-09-17 MED ORDER — PREDNISONE 10 MG PO TABS: ORAL_TABLET | ORAL | Status: DC

## 2015-09-17 MED ORDER — PREDNISONE 20 MG PO TABS
20.0000 mg | ORAL_TABLET | Freq: Once | ORAL | Status: AC
  Administered 2015-09-17: 20 mg via ORAL
  Filled 2015-09-17: qty 1
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
## 2015-09-17 NOTE — Patient Outreach (Signed)
Sara [name]) Care Management  09/17/2015  Sara [name] 10-07-1980 [id number]   Care Coordination-follow up-spoke with [name] who reports she has not called insurance plan to obtain primary care physician list. [name] states she is going to do that today. RNCM discussed referral to [place] health care special needs plan. [name] is agreeable. [name] is to call [facility] when she obtains a primary care physician. [name] remains ineligible for the program at time.  Plan: refer to [employer] special needs plan and close case.  Thea Silversmith, RN, MSN, [place] Coordinator Cell: [phone]

## 2015-09-17 NOTE — ED Notes (Signed)
Pt is here for dialysis

## 2015-09-17 NOTE — Discharge Instructions (Signed)
If you were given medicines take as directed.  If you are on coumadin or contraceptives realize their levels and effectiveness is altered by many different medicines.  If you have any reaction (rash, tongues swelling, other) to the medicines stop taking and see a physician.    If your blood pressure was elevated in the ER make sure you follow up for management with a primary doctor or return for chest pain, shortness of breath or stroke symptoms.  Please follow up as directed and return to the ER or see a physician for new or worsening symptoms.  Thank you. Filed Vitals:   09/17/15 0756  BP: 148/108  Pulse: 107  Temp: 97.8 F (36.6 C)  TempSrc: Oral  Resp: 18  Height: 5\' 6"  (1.676 m)  Weight: 200 lb (90.719 kg)  SpO2: 100%

## 2015-09-17 NOTE — ED Notes (Signed)
Pt states she last had dialysis on Friday and presents for HD session.  Also states she is out of her prednisone and has pain all over.

## 2015-09-18 NOTE — Discharge Summary (Signed)
Physician Discharge Summary  Sara [name] [id number] DOB: 03-Apr-1981 DOA: 09/14/2015  PCP: No primary care provider on file.  Admit date: 09/14/2015 Discharge date: 09/18/2015  Time spent: 35 minutes  Recommendations for Outpatient Follow-up:  1. Follow up with PCP next week.  2. Return for next dialysis session.    Discharge Diagnoses:  Active Problems:   Schizoaffective disorder, bipolar type (HCC)   (HFpEF) heart failure with preserved ejection fraction (HCC)   ESRD needing dialysis ([place])   Chronic systolic CHF (congestive heart failure) ([place])   Lupus (systemic lupus erythematosus) ([place])   Discharge Condition: improved.   Diet recommendation: renal diet.   Filed Weights   09/14/15 0858 09/14/15 1540  Weight: 90.266 kg (199 lb) 90.2 kg (198 lb 13.7 oz)    History of present illness: Sara [name] is an 35 y.o. female with hx of ESRD, lupus nephritis, severe non compliant, terminated from her previous nephrologist in [place] for behavior issues, presented to the ER for hemodialysis. She denied SOB, CP, F, or Chills. Dr Chilton Greathouse was aware, and intended to dialyze her today. Hospitalist was asked to admit her OBS for same. She has normal K.  Hospital Course: Patient was admitted OBS and was dialyzed as usual, uneventfully.  She subsequently went home after her dialysis.   Procedures:  Dialysis, almost routine.   Consultations:  Dr Chilton Greathouse.   Discharge Exam: Filed Vitals:   09/14/15 2020 09/14/15 2040  BP: 154/83 155/81  Pulse: 101 100  Temp:    Resp:        Discharge Medication List as of 09/14/2015  9:05 PM    CONTINUE these medications which have NOT CHANGED   Details  acetaminophen (TYLENOL) 500 MG tablet Take 1,000 mg by mouth every 6 (six) hours as needed for moderate pain. Reported on 06/12/2015, Until Discontinued, Historical Med    clindamycin (CLEOCIN) 150 MG capsule Take 150 mg by mouth 4 (four) times daily., Until Discontinued, Historical  Med    furosemide (LASIX) 40 MG tablet Take 3 tablets (120 mg total) by mouth daily., Starting 08/11/2015, Until Discontinued, Print    hydrALAZINE (APRESOLINE) 10 MG tablet Take 1 tablet (10 mg total) by mouth 3 (three) times daily., Starting 08/11/2015, Until Discontinued, Print    metoprolol (LOPRESSOR) 50 MG tablet Take 1 tablet (50 mg total) by mouth 2 (two) times daily., Starting 08/11/2015, Until Discontinued, Print    predniSONE (DELTASONE) 20 MG tablet Take 1 tablet (20 mg total) by mouth daily with breakfast., Starting 07/02/2015, Until Discontinued, No Print       Allergies  Allergen Reactions  . Ativan [Lorazepam] Swelling and Other (See Comments)    Dysarthria(patient has difficulty speaking and slurred speech); denies swelling, itching, pain, or numbness.  [name] [Ziprasidone Hcl] Itching and Swelling    Tongue swelling  . Keflex [Cephalexin] Swelling and Other (See Comments)    Tongue swelling. Can't talk   . Haldol [Haloperidol Lactate] Swelling    Tongue swelling. 05/31/15 - MD ok with giving as pt has tolerated in the past Pt can take benadryl.  . Other Itching    wool      The results of significant diagnostics from this hospitalization (including imaging, microbiology, ancillary and laboratory) are listed below for reference.    Significant Diagnostic Studies: Dg Chest 2 View  08/22/2015  CLINICAL DATA:  Shortness of breath and chest pain EXAM: CHEST  2 VIEW COMPARISON:  08/11/2015 FINDINGS: Cardiomegaly with congested and cephalized vessels, fissural  thickening, and trace effusions. No consolidation. Stable aortic and hilar contours. IMPRESSION: Chronic cardiomegaly, venous congestion, and trace effusions. Electronically Signed   By: Monte Fantasia M.D.   On: 08/22/2015 09:52    Labs: Basic Metabolic Panel:  Recent Labs Lab 09/12/15 0809 09/14/15 0923 09/17/15 0817  NA 135 136 137  K 5.0 4.1 4.1  CL 97* 95* 96*  GLUCOSE 99 97 115*  BUN 73* 48* 47*   CREATININE 11.30* 9.20* 9.60*   CBC:  Recent Labs Lab 09/12/15 0809 09/12/15 1130 09/14/15 0923 09/17/15 0817  WBC  --  8.2  --   --   HGB 9.2* 8.4* 9.5* 9.2*  HCT 27.0* 25.0* 28.0* 27.0*  MCV  --  89.0  --   --   PLT  --  194  --   --     Signed:  Christiann Hagerty MD. Rosalita Chessman.  [facility] 09/18/2015, 9:16 PM

## 2015-09-19 ENCOUNTER — Encounter (HOSPITAL_COMMUNITY): Payer: Self-pay | Admitting: Emergency Medicine

## 2015-09-19 ENCOUNTER — Emergency Department (HOSPITAL_COMMUNITY)
Admission: EM | Admit: 2015-09-19 | Discharge: 2015-09-19 | Disposition: A | Discharge status: Home / Self Care | Payer: Medicare Other | Attending: Emergency Medicine | Admitting: Emergency Medicine

## 2015-09-19 DIAGNOSIS — N186 End stage renal disease: Secondary | ICD-10-CM | POA: Insufficient documentation

## 2015-09-19 DIAGNOSIS — I509 Heart failure, unspecified: Secondary | ICD-10-CM | POA: Diagnosis not present

## 2015-09-19 DIAGNOSIS — I132 Hypertensive heart and chronic kidney disease with heart failure and with stage 5 chronic kidney disease, or end stage renal disease: Secondary | ICD-10-CM | POA: Insufficient documentation

## 2015-09-19 DIAGNOSIS — M545 Low back pain: Secondary | ICD-10-CM | POA: Diagnosis not present

## 2015-09-19 DIAGNOSIS — Z992 Dependence on renal dialysis: Secondary | ICD-10-CM | POA: Diagnosis not present

## 2015-09-19 DIAGNOSIS — Z79899 Other long term (current) drug therapy: Secondary | ICD-10-CM | POA: Insufficient documentation

## 2015-09-19 DIAGNOSIS — F250 Schizoaffective disorder, bipolar type: Secondary | ICD-10-CM | POA: Diagnosis not present

## 2015-09-19 DIAGNOSIS — F17210 Nicotine dependence, cigarettes, uncomplicated: Secondary | ICD-10-CM | POA: Insufficient documentation

## 2015-09-19 DIAGNOSIS — I120 Hypertensive chronic kidney disease with stage 5 chronic kidney disease or end stage renal disease: Secondary | ICD-10-CM | POA: Diagnosis not present

## 2015-09-19 LAB — I-STAT CHEM 8, ED
BUN: 71 mg/dL — ABNORMAL HIGH (ref 6–20)
Calcium, Ion: 1.08 mmol/L — ABNORMAL LOW (ref 1.12–1.23)
Chloride: 99 mmol/L — ABNORMAL LOW (ref 101–111)
Creatinine, Ser: 12.9 mg/dL — ABNORMAL HIGH (ref 0.44–1.00)
GLUCOSE: 106 mg/dL — AB (ref 65–99)
HCT: 26 % — ABNORMAL LOW (ref 36.0–46.0)
HEMOGLOBIN: 8.8 g/dL — AB (ref 12.0–15.0)
POTASSIUM: 4 mmol/L (ref 3.5–5.1)
Sodium: 136 mmol/L (ref 135–145)
TCO2: 23 mmol/L (ref 0–100)

## 2015-09-19 NOTE — Discharge Instructions (Signed)
Return for shortness of breath, fevers or new concerns.  If you were given medicines take as directed.  If you are on coumadin or contraceptives realize their levels and effectiveness is altered by many different medicines.  If you have any reaction (rash, tongues swelling, other) to the medicines stop taking and see a physician.    If your blood pressure was elevated in the ER make sure you follow up for management with a primary doctor or return for chest pain, shortness of breath or stroke symptoms.  Please follow up as directed and return to the ER or see a physician for new or worsening symptoms.  Thank you. Filed Vitals:   09/19/15 0812  BP: 158/113  Pulse: 57  Temp: 98 F (36.7 C)  TempSrc: Oral  Resp: 18  Height: 5\' 7"  (1.702 m)  Weight: 194 lb (87.998 kg)  SpO2: 100%

## 2015-09-19 NOTE — ED Notes (Signed)
Pt reports lower back pain and " i need dialysis."
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
## 2015-09-21 ENCOUNTER — Encounter (HOSPITAL_COMMUNITY): Payer: Self-pay

## 2015-09-21 ENCOUNTER — Observation Stay (HOSPITAL_COMMUNITY)
Admission: EM | Admit: 2015-09-21 | Discharge: 2015-09-21 | Discharge status: Against Medical Advice | Payer: Medicare Other | Attending: Internal Medicine | Admitting: Internal Medicine

## 2015-09-21 DIAGNOSIS — E8770 Fluid overload, unspecified: Secondary | ICD-10-CM | POA: Diagnosis not present

## 2015-09-21 DIAGNOSIS — I5020 Unspecified systolic (congestive) heart failure: Secondary | ICD-10-CM | POA: Insufficient documentation

## 2015-09-21 DIAGNOSIS — I5022 Chronic systolic (congestive) heart failure: Secondary | ICD-10-CM

## 2015-09-21 DIAGNOSIS — F259 Schizoaffective disorder, unspecified: Secondary | ICD-10-CM | POA: Diagnosis not present

## 2015-09-21 DIAGNOSIS — Z720 Tobacco use: Secondary | ICD-10-CM | POA: Insufficient documentation

## 2015-09-21 DIAGNOSIS — D588 Other specified hereditary hemolytic anemias: Secondary | ICD-10-CM | POA: Insufficient documentation

## 2015-09-21 DIAGNOSIS — N186 End stage renal disease: Secondary | ICD-10-CM | POA: Insufficient documentation

## 2015-09-21 DIAGNOSIS — I132 Hypertensive heart and chronic kidney disease with heart failure and with stage 5 chronic kidney disease, or end stage renal disease: Secondary | ICD-10-CM | POA: Insufficient documentation

## 2015-09-21 DIAGNOSIS — I120 Hypertensive chronic kidney disease with stage 5 chronic kidney disease or end stage renal disease: Secondary | ICD-10-CM | POA: Diagnosis not present

## 2015-09-21 DIAGNOSIS — E8779 Other fluid overload: Principal | ICD-10-CM | POA: Insufficient documentation

## 2015-09-21 DIAGNOSIS — Z79899 Other long term (current) drug therapy: Secondary | ICD-10-CM | POA: Insufficient documentation

## 2015-09-21 DIAGNOSIS — Z992 Dependence on renal dialysis: Secondary | ICD-10-CM | POA: Diagnosis not present

## 2015-09-21 DIAGNOSIS — F17210 Nicotine dependence, cigarettes, uncomplicated: Secondary | ICD-10-CM | POA: Insufficient documentation

## 2015-09-21 DIAGNOSIS — Z9115 Patient's noncompliance with renal dialysis: Secondary | ICD-10-CM | POA: Insufficient documentation

## 2015-09-21 DIAGNOSIS — I10 Essential (primary) hypertension: Secondary | ICD-10-CM | POA: Diagnosis not present

## 2015-09-21 LAB — BASIC METABOLIC PANEL
ANION GAP: 17 — AB (ref 5–15)
BUN: 105 mg/dL — ABNORMAL HIGH (ref 6–20)
CALCIUM: 9.3 mg/dL (ref 8.9–10.3)
CO2: 21 mmol/L — ABNORMAL LOW (ref 22–32)
Chloride: 98 mmol/L — ABNORMAL LOW (ref 101–111)
Creatinine, Ser: 14.03 mg/dL — ABNORMAL HIGH (ref 0.44–1.00)
GFR, EST AFRICAN AMERICAN: 3 mL/min — AB (ref >60)
GFR, EST NON AFRICAN AMERICAN: 3 mL/min — AB (ref >60)
GLUCOSE: 103 mg/dL — AB (ref 65–99)
Potassium: 4.5 mmol/L (ref 3.5–5.1)
SODIUM: 136 mmol/L (ref 135–145)

## 2015-09-21 LAB — CBC WITH DIFFERENTIAL/PLATELET
BASOS ABS: 0 10*3/uL (ref 0.0–0.1)
BASOS PCT: 0 %
EOS ABS: 0.1 10*3/uL (ref 0.0–0.7)
EOS PCT: 1 %
HCT: 23.7 % — ABNORMAL LOW (ref 36.0–46.0)
Hemoglobin: 8.1 g/dL — ABNORMAL LOW (ref 12.0–15.0)
Lymphocytes Relative: 13 %
Lymphs Abs: 1 10*3/uL (ref 0.7–4.0)
MCH: 29.8 pg (ref 26.0–34.0)
MCHC: 34.2 g/dL (ref 30.0–36.0)
MCV: 87.1 fL (ref 78.0–100.0)
MONO ABS: 0.7 10*3/uL (ref 0.1–1.0)
Monocytes Relative: 9 %
Neutro Abs: 6.1 10*3/uL (ref 1.7–7.7)
Neutrophils Relative %: 77 %
PLATELETS: 191 10*3/uL (ref 150–400)
RBC: 2.72 MIL/uL — AB (ref 3.87–5.11)
RDW: 16.5 % — AB (ref 11.5–15.5)
WBC: 7.9 10*3/uL (ref 4.0–10.5)

## 2015-09-21 MED ORDER — SODIUM CHLORIDE 0.9 % IV SOLN: 100.0000 mL | INTRAVENOUS | Status: DC | PRN

## 2015-09-21 MED ORDER — LIDOCAINE HCL (PF) 1 % IJ SOLN
5.0000 mL | INTRAMUSCULAR | Status: DC | PRN
  Administered 2015-09-21: 0.5 mL via INTRADERMAL

## 2015-09-21 MED ORDER — DIPHENHYDRAMINE HCL 25 MG PO CAPS
25.0000 mg | ORAL_CAPSULE | ORAL | Status: DC | PRN
  Administered 2015-09-21: 25 mg via ORAL

## 2015-09-21 MED ORDER — SEVELAMER CARBONATE 800 MG PO TABS
2400.0000 mg | ORAL_TABLET | Freq: Three times a day (TID) | ORAL | Status: DC
  Filled 2015-09-21 (×10): qty 3

## 2015-09-21 MED ORDER — FUROSEMIDE 40 MG PO TABS: 120.0000 mg | ORAL_TABLET | Freq: Every day | ORAL | Status: DC

## 2015-09-21 MED ORDER — DIPHENHYDRAMINE HCL 25 MG PO CAPS
ORAL_CAPSULE | ORAL | Status: AC
  Administered 2015-09-21: 25 mg via ORAL
  Filled 2015-09-21: qty 1

## 2015-09-21 MED ORDER — EPOETIN ALFA 20000 UNIT/ML IJ SOLN
14000.0000 [IU] | INTRAMUSCULAR | Status: DC
  Administered 2015-09-21: 14000 [IU] via SUBCUTANEOUS
  Filled 2015-09-21 (×2): qty 1

## 2015-09-21 MED ORDER — EPOETIN ALFA 20000 UNIT/ML IJ SOLN
INTRAMUSCULAR | Status: AC
  Administered 2015-09-21: 14000 [IU] via SUBCUTANEOUS
  Filled 2015-09-21: qty 1

## 2015-09-21 MED ORDER — HEPARIN SODIUM (PORCINE) 1000 UNIT/ML IJ SOLN
INTRAMUSCULAR | Status: AC
  Administered 2015-09-21: 1000 [IU] via INTRAVENOUS_CENTRAL
  Filled 2015-09-21: qty 5

## 2015-09-21 MED ORDER — TRAMADOL HCL 50 MG PO TABS
ORAL_TABLET | ORAL | Status: AC
  Administered 2015-09-21: 50 mg via ORAL
  Filled 2015-09-21: qty 1

## 2015-09-21 MED ORDER — LIDOCAINE-PRILOCAINE 2.5-2.5 % EX CREA: 1.0000 "application " | TOPICAL_CREAM | CUTANEOUS | Status: DC | PRN

## 2015-09-21 MED ORDER — PENTAFLUOROPROP-TETRAFLUOROETH EX AERO: 1.0000 "application " | INHALATION_SPRAY | CUTANEOUS | Status: DC | PRN

## 2015-09-21 MED ORDER — LIDOCAINE HCL (PF) 1 % IJ SOLN
INTRAMUSCULAR | Status: AC
  Administered 2015-09-21: 0.5 mL via INTRADERMAL
  Filled 2015-09-21: qty 5

## 2015-09-21 MED ORDER — TRAMADOL HCL 50 MG PO TABS
50.0000 mg | ORAL_TABLET | Freq: Two times a day (BID) | ORAL | Status: DC
  Administered 2015-09-21: 50 mg via ORAL

## 2015-09-21 MED ORDER — PREDNISONE 10 MG PO TABS: 10.0000 mg | ORAL_TABLET | Freq: Every day | ORAL | Status: DC

## 2015-09-21 MED ORDER — HEPARIN SODIUM (PORCINE) 1000 UNIT/ML DIALYSIS
1000.0000 [IU] | INTRAMUSCULAR | Status: DC | PRN
  Administered 2015-09-21: 1000 [IU] via INTRAVENOUS_CENTRAL
  Filled 2015-09-21 (×2): qty 1

## 2015-09-21 MED ORDER — HEPARIN SODIUM (PORCINE) 5000 UNIT/ML IJ SOLN: 5000.0000 [IU] | Freq: Three times a day (TID) | INTRAMUSCULAR | Status: DC

## 2015-09-21 NOTE — Procedures (Signed)
HEMODIALYSIS TREATMENT NOTE:  4.5 hour low-heparin dialysis completed via right upper arm AVF. Goal met: 4 liters removed without interruption in ultrafiltration. All blood was returned and hemostasis was achieved within 10 minutes.  Pt stated she was leaving hospital from HD unit.  Renal diet, sodium/fluid restriction were reinforced. Pt signed AMA waiver and left, ambulating with steady gait.   , RN, CDN 

## 2015-09-21 NOTE — ED Notes (Signed)
Per Janett Billow from 300 pt left AMA after dialysis. Request pt be discharged from the ED
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
## 2015-09-21 NOTE — Discharge Summary (Signed)
Patient left AMA after she finished her HD. Phillips Climes MD
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
## 2015-09-21 NOTE — ED Notes (Signed)
PA at bedside.

## 2015-09-21 NOTE — ED Notes (Signed)
Pt here for dialysis, last treatment was Friday of last week.

## 2015-09-26 ENCOUNTER — Encounter (HOSPITAL_COMMUNITY): Payer: Self-pay

## 2015-09-26 ENCOUNTER — Observation Stay (HOSPITAL_COMMUNITY)
Admission: EM | Admit: 2015-09-26 | Discharge: 2015-09-26 | Disposition: A | Discharge status: Home / Self Care | Payer: Medicare Other | Attending: Internal Medicine | Admitting: Internal Medicine

## 2015-09-26 DIAGNOSIS — R0602 Shortness of breath: Secondary | ICD-10-CM | POA: Diagnosis not present

## 2015-09-26 DIAGNOSIS — F209 Schizophrenia, unspecified: Secondary | ICD-10-CM | POA: Insufficient documentation

## 2015-09-26 DIAGNOSIS — Z992 Dependence on renal dialysis: Secondary | ICD-10-CM | POA: Diagnosis not present

## 2015-09-26 DIAGNOSIS — N186 End stage renal disease: Principal | ICD-10-CM | POA: Insufficient documentation

## 2015-09-26 DIAGNOSIS — F319 Bipolar disorder, unspecified: Secondary | ICD-10-CM | POA: Insufficient documentation

## 2015-09-26 DIAGNOSIS — I5020 Unspecified systolic (congestive) heart failure: Secondary | ICD-10-CM | POA: Diagnosis not present

## 2015-09-26 DIAGNOSIS — M3214 Glomerular disease in systemic lupus erythematosus: Secondary | ICD-10-CM | POA: Insufficient documentation

## 2015-09-26 DIAGNOSIS — Z79899 Other long term (current) drug therapy: Secondary | ICD-10-CM | POA: Insufficient documentation

## 2015-09-26 DIAGNOSIS — F17210 Nicotine dependence, cigarettes, uncomplicated: Secondary | ICD-10-CM | POA: Diagnosis not present

## 2015-09-26 DIAGNOSIS — I132 Hypertensive heart and chronic kidney disease with heart failure and with stage 5 chronic kidney disease, or end stage renal disease: Secondary | ICD-10-CM | POA: Insufficient documentation

## 2015-09-26 LAB — BASIC METABOLIC PANEL
Anion gap: 17 — ABNORMAL HIGH (ref 5–15)
BUN: 104 mg/dL — ABNORMAL HIGH (ref 6–20)
CHLORIDE: 97 mmol/L — AB (ref 101–111)
CO2: 21 mmol/L — AB (ref 22–32)
CREATININE: 13.53 mg/dL — AB (ref 0.44–1.00)
Calcium: 9.3 mg/dL (ref 8.9–10.3)
GFR calc non Af Amer: 3 mL/min — ABNORMAL LOW (ref >60)
GFR, EST AFRICAN AMERICAN: 4 mL/min — AB (ref >60)
Glucose, Bld: 86 mg/dL (ref 65–99)
POTASSIUM: 5 mmol/L (ref 3.5–5.1)
Sodium: 135 mmol/L (ref 135–145)

## 2015-09-26 LAB — CBC
HCT: 23.6 % — ABNORMAL LOW (ref 36.0–46.0)
Hemoglobin: 7.9 g/dL — ABNORMAL LOW (ref 12.0–15.0)
MCH: 28.9 pg (ref 26.0–34.0)
MCHC: 33.5 g/dL (ref 30.0–36.0)
MCV: 86.4 fL (ref 78.0–100.0)
Platelets: 252 10*3/uL (ref 150–400)
RBC: 2.73 MIL/uL — ABNORMAL LOW (ref 3.87–5.11)
RDW: 16.7 % — ABNORMAL HIGH (ref 11.5–15.5)
WBC: 9.3 10*3/uL (ref 4.0–10.5)

## 2015-09-26 MED ORDER — TRAMADOL HCL 50 MG PO TABS
50.0000 mg | ORAL_TABLET | Freq: Two times a day (BID) | ORAL | Status: DC
  Administered 2015-09-26: 50 mg via ORAL

## 2015-09-26 MED ORDER — SODIUM CHLORIDE 0.9 % IV SOLN: 100.0000 mL | INTRAVENOUS | Status: DC | PRN

## 2015-09-26 MED ORDER — EPOETIN ALFA 10000 UNIT/ML IJ SOLN
INTRAMUSCULAR | Status: AC
  Administered 2015-09-26: 10000 [IU] via SUBCUTANEOUS
  Filled 2015-09-26: qty 1

## 2015-09-26 MED ORDER — EPOETIN ALFA 10000 UNIT/ML IJ SOLN
10000.0000 [IU] | INTRAMUSCULAR | Status: DC
  Administered 2015-09-26: 10000 [IU] via SUBCUTANEOUS

## 2015-09-26 MED ORDER — FUROSEMIDE 40 MG PO TABS: 120.0000 mg | ORAL_TABLET | Freq: Every day | ORAL | Status: DC

## 2015-09-26 MED ORDER — PREDNISONE 20 MG PO TABS: 20.0000 mg | ORAL_TABLET | Freq: Every day | ORAL | Status: DC

## 2015-09-26 MED ORDER — TRAMADOL HCL 50 MG PO TABS
ORAL_TABLET | ORAL | Status: AC
  Administered 2015-09-26: 50 mg via ORAL
  Filled 2015-09-26: qty 1

## 2015-09-26 MED ORDER — EPOETIN ALFA 4000 UNIT/ML IJ SOLN
4000.0000 [IU] | INTRAMUSCULAR | Status: DC
  Filled 2015-09-26 (×2): qty 1

## 2015-09-26 MED ORDER — ACETAMINOPHEN 500 MG PO TABS: 1000.0000 mg | ORAL_TABLET | Freq: Four times a day (QID) | ORAL | Status: DC | PRN

## 2015-09-26 MED ORDER — HEPARIN SODIUM (PORCINE) 1000 UNIT/ML DIALYSIS
20.0000 [IU]/kg | INTRAMUSCULAR | Status: DC | PRN
  Administered 2015-09-26: 1900 [IU] via INTRAVENOUS_CENTRAL
  Filled 2015-09-26 (×2): qty 2

## 2015-09-26 MED ORDER — DIPHENHYDRAMINE HCL 25 MG PO CAPS
ORAL_CAPSULE | ORAL | Status: AC
  Administered 2015-09-26: 25 mg via ORAL
  Filled 2015-09-26: qty 1

## 2015-09-26 MED ORDER — LIDOCAINE HCL (PF) 1 % IJ SOLN
INTRAMUSCULAR | Status: AC
  Administered 2015-09-26: 0.5 mL via INTRADERMAL
  Filled 2015-09-26: qty 5

## 2015-09-26 MED ORDER — HEPARIN SODIUM (PORCINE) 1000 UNIT/ML IJ SOLN
INTRAMUSCULAR | Status: AC
  Administered 2015-09-26: 1900 [IU] via INTRAVENOUS_CENTRAL
  Filled 2015-09-26: qty 5

## 2015-09-26 MED ORDER — METOPROLOL TARTRATE 50 MG PO TABS: 50.0000 mg | ORAL_TABLET | Freq: Two times a day (BID) | ORAL | Status: DC

## 2015-09-26 MED ORDER — PENTAFLUOROPROP-TETRAFLUOROETH EX AERO: 1.0000 "application " | INHALATION_SPRAY | CUTANEOUS | Status: DC | PRN

## 2015-09-26 MED ORDER — DIPHENHYDRAMINE HCL 25 MG PO CAPS
25.0000 mg | ORAL_CAPSULE | Freq: Four times a day (QID) | ORAL | Status: DC | PRN
  Administered 2015-09-26: 25 mg via ORAL

## 2015-09-26 MED ORDER — LIDOCAINE HCL (PF) 1 % IJ SOLN
5.0000 mL | INTRAMUSCULAR | Status: DC | PRN
  Administered 2015-09-26: 0.5 mL via INTRADERMAL

## 2015-09-26 MED ORDER — HYDRALAZINE HCL 10 MG PO TABS
10.0000 mg | ORAL_TABLET | Freq: Three times a day (TID) | ORAL | Status: DC
  Filled 2015-09-26 (×6): qty 1

## 2015-09-26 MED ORDER — ENOXAPARIN SODIUM 40 MG/0.4ML ~~LOC~~ SOLN: 40.0000 mg | SUBCUTANEOUS | Status: DC

## 2015-09-26 NOTE — ED Notes (Signed)
Pt here for dialysis.  Reports last treatment was Friday.

## 2015-09-26 NOTE — Consult Note (Signed)
Sara [name] MRN: [id number] DOB/AGE: 07/13/1980 25 y.o. Primary Care Physician:No primary care provider on file. Admit date: 09/26/2015 Chief Complaint:  Chief Complaint  Patient presents with  . dialysis    HPI: Pt is 35 year old female with past medical hx of ESRD who was admitted with c/o dyspnea," I need dialysis "  HPI dates back to few days ago started feeling short of breath,and so pt came to ER asking for her dialysis. Pt says " I need my dialysis . I had my dialysis last Friday. I did not have HD no MOnday ". No c/o fever/cough/chills NO c/o nausea/vomiting/ diarrhea No c/o hematuria Pt was seen  for need of renal replacement therapy.   Past Medical History  Diagnosis Date  . Lupus ([place])     lupus w nephritis  . History of blood transfusion     "this is probably my 3rd" (10/09/2014)  . ESRD (end stage renal disease) on dialysis [facility])     "[name]; [facility]" (10/09/2014)  . Bipolar disorder ([place])     [name] 10/09/2014  . Schizophrenia ([place])     [name] 10/09/2014  . Chronic anemia     [name] 10/09/2014  . CHF (congestive heart failure) ([place])     systolic/notes [id number]  . Acute myopericarditis     hx/notes 10/09/2014  . Psychosis   . Hypertension   . Pregnancy   . Low back pain   . Positive ANA (antinuclear antibody) 08/16/2012  . 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 Tobacco abuse 02/20/2014  . Schizoaffective disorder, bipolar type ([place]) 11/20/2014  . Non-compliant patient         Family History  Problem Relation Age of Onset  . Drug abuse Father   . Kidney disease Father     Social History:  reports that she has been smoking Cigarettes.  She has a 2 pack-year smoking history. She uses smokeless tobacco. She reports that she does not drink alcohol or use illicit drugs.   Allergies:  Allergies  Allergen Reactions  . Ativan [Lorazepam] Swelling and Other (See Comments)    Dysarthria(patient has difficulty speaking and slurred  speech); denies swelling, itching, pain, or numbness.  [name] [Ziprasidone Hcl] Itching and Swelling    Tongue swelling  . Keflex [Cephalexin] Swelling and Other (See Comments)    Tongue swelling. Can't talk   . Haldol [Haloperidol Lactate] Swelling    Tongue swelling. 05/31/15 - MD ok with giving as pt has tolerated in the past Pt can take benadryl.  . Other Itching    wool     (Not in a hospital admission)     [id number] from the symptoms mentioned above,there are no other symptoms referable to all systems reviewed.      Physical Exam: Vital signs in last 24 hours: Temp:  [97.6 F (36.4 C)] 97.6 F (36.4 C) (06/21 0809) Pulse Rate:  [117] 117 (06/21 0809) Resp:  [20] 20 (06/21 0809) BP: (161)/(115) 161/115 mmHg (06/21 0809) SpO2:  [100 %] 100 % (06/21 0809) Weight:  [204 lb (92.534 kg)] 204 lb (92.534 kg) (06/21 0809) Weight change:     Intake/Output from previous day:       Physical Exam: General- pt is awake,alert, follows coomands Resp- No acute REsp distress,  decreased bs at bases. CVS- S1S2 regular in rate and rhythm, NO rubs  GIT- BS+, soft, NT, ND EXT- 2+ LE Edema, NO Cyanosis CNS- CN 2-12 grossly intact. Moving  all 4 extremities Access- AVF+, aneurysm present    Lab Results:  CBC    Component Value Date/Time   WBC 7.9 09/21/2015 0841   RBC 2.72* 09/21/2015 0841   RBC 2.77* 06/21/2015 1022   HGB 8.1* 09/21/2015 0841   HCT 23.7* 09/21/2015 0841   PLT 191 09/21/2015 0841   MCV 87.1 09/21/2015 0841   MCH 29.8 09/21/2015 0841   MCHC 34.2 09/21/2015 0841   RDW 16.5* 09/21/2015 0841   LYMPHSABS 1.0 09/21/2015 0841   MONOABS 0.7 09/21/2015 0841   EOSABS 0.1 09/21/2015 0841   BASOSABS 0.0 09/21/2015 0841      BMET No results for input(s): NA, K, CL, CO2, GLUCOSE, BUN, CREATININE, CALCIUM in the last 72 hours.  MICRO No results found for this or any previous visit (from the past 240 hour(s)).    Lab Results  Component Value Date    PTH 420* 05/26/2015   CALCIUM 9.3 09/21/2015   CAION 1.08* 09/19/2015   PHOS 12.0* 08/17/2015      Impression: 1)Renal  ESRD on HD                Pt is not in regular  Schedule sec to her complaince/adherence issues                Will dialyze pt today  2)HTN Bp is not at goal  3)Anemia In ESRD the goal for HGb is 9--11. Pt HGb is not at goal Will keep  on epo   4)CKD Mineral-Bone Disorder PTH acceptable. Secondary Hyperparathyroidism  Present. Phosphorus not at goal.  on binders  5)Psych .  Hx of   psycosis Schizophrenia Primary MD following  6)Electrolytes  Normokalemic  Hyponatremic    Sec to ESRD  7)Acid base Co2 not  at goal Sec to no adherence to HD   Plan:  Will dialyze today Will keep  on epo Will use 2 k bath Will try to take 3 liters off     Sara [name] S 09/26/2015, 8:43 AM

## 2015-09-26 NOTE — ED Notes (Signed)
Patient is resting comfortably. 
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
## 2015-09-26 NOTE — ED Notes (Signed)
Pt refused IV  

## 2015-09-26 NOTE — Discharge Summary (Signed)
Physician Discharge Summary  Sara [name] [id number] DOB: 12/18/1980 DOA: 09/26/2015  PCP: No primary care provider on file.  Admit date: 09/26/2015 Discharge date: 09/26/2015  Time spent: 45 minutes  Recommendations for Outpatient Follow-up:  -Follow-up with outpatient provider   Discharge Diagnoses:  Principal Problem:   Encounter for hemodialysis [facility]) Active Problems:   ESRD (end stage renal disease) on dialysis [facility])   Discharge Condition: Stable and improved  Filed Weights   09/26/15 0809  Weight: 92.534 kg (204 lb)    History of present illness:  Sara [name] is a 35 y.o. female who comes in today asking for dialysis. She was last dialyzed 5 days ago and she believes she has some increased fluid, she is not short of breath but her lower extremities have been quite swollen. Admitted for dialysis needs.  Hospital Course:   End-stage renal disease -Patient here for dialysis needs, will be discharged today after it is completed.  Procedures:  None    Consultations:  Nephrology  Discharge Instructions  Discharge Instructions    Diet - low sodium heart healthy    Complete by:  As directed      Increase activity slowly    Complete by:  As directed             Medication List    STOP taking these medications        clindamycin 150 MG capsule  Commonly known as:  CLEOCIN      TAKE these medications        acetaminophen 500 MG tablet  Commonly known as:  TYLENOL  Take 1,000 mg by mouth every 6 (six) hours as needed for moderate pain. Reported on 06/12/2015     furosemide 40 MG tablet  Commonly known as:  LASIX  Take 3 tablets (120 mg total) by mouth daily.     hydrALAZINE 10 MG tablet  Commonly known as:  APRESOLINE  Take 1 tablet (10 mg total) by mouth 3 (three) times daily.     metoprolol 50 MG tablet  Commonly known as:  LOPRESSOR  Take 1 tablet (50 mg total) by mouth 2 (two) times daily.     predniSONE 20 MG tablet    Commonly known as:  DELTASONE  Take 1 tablet (20 mg total) by mouth daily with breakfast.       Allergies  Allergen Reactions  . Ativan [Lorazepam] Swelling and Other (See Comments)    Dysarthria(patient has difficulty speaking and slurred speech); denies swelling, itching, pain, or numbness.  Sara [name] [Ziprasidone Hcl] Itching and Swelling    Tongue swelling  . Keflex [Cephalexin] Swelling and Other (See Comments)    Tongue swelling. Can't talk   . Haldol [Haloperidol Lactate] Swelling    Tongue swelling. 05/31/15 - MD ok with giving as pt has tolerated in the past Pt can take benadryl.  . Other Itching    wool      The results of significant diagnostics from this hospitalization (including imaging, microbiology, ancillary and laboratory) are listed below for reference.    Significant Diagnostic Studies: No results found.  Microbiology: No results found for this or any previous visit (from the past 240 hour(s)).   Labs: Basic Metabolic Panel:  Recent Labs Lab 09/21/15 0841 09/26/15 1143  NA 136 135  K 4.5 5.0  CL 98* 97*  CO2 21* 21*  GLUCOSE 103* 86  BUN 105* 104*  CREATININE 14.03* 13.53*  CALCIUM 9.3 9.3  Liver Function Tests: No results for input(s): AST, ALT, ALKPHOS, BILITOT, PROT, ALBUMIN in the last 168 hours. No results for input(s): LIPASE, AMYLASE in the last 168 hours. No results for input(s): AMMONIA in the last 168 hours. CBC:  Recent Labs Lab 09/21/15 0841 09/26/15 1143  WBC 7.9 9.3  NEUTROABS 6.1  --   HGB 8.1* 7.9*  HCT 23.7* 23.6*  MCV 87.1 86.4  PLT 191 252   Cardiac Enzymes: No results for input(s): CKTOTAL, CKMB, CKMBINDEX, TROPONINI in the last 168 hours. BNP: BNP (last 3 results) No results for input(s): BNP in the last 8760 hours.  ProBNP (last 3 results) No results for input(s): PROBNP in the last 8760 hours.  CBG: No results for input(s): GLUCAP in the last 168 hours.     SignedLelon [name]  [facility] Pager: [phone] 09/26/2015, 3:09 PM
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
## 2015-09-26 NOTE — Procedures (Signed)
   HEMODIALYSIS TREATMENT NOTE:  4.5 hour low-heparin dialysis completed via right upper arm AVF (15g ante/retrograde). Goal met: 4 liters removed without interruption in ultrafiltration. All blood was returned and hemostasis was achieved within 15 minutes. Pt discharged home from HD unit.  Rockwell Alexandria, RN, CDN

## 2015-09-28 ENCOUNTER — Emergency Department (HOSPITAL_COMMUNITY): Payer: Medicare Other

## 2015-09-28 ENCOUNTER — Encounter (HOSPITAL_COMMUNITY): Payer: Self-pay | Admitting: Emergency Medicine

## 2015-09-28 ENCOUNTER — Observation Stay (HOSPITAL_COMMUNITY)
Admission: EM | Admit: 2015-09-28 | Discharge: 2015-09-28 | Disposition: A | Discharge status: Home / Self Care | Payer: Medicare Other | Attending: Internal Medicine | Admitting: Internal Medicine

## 2015-09-28 DIAGNOSIS — E8770 Fluid overload, unspecified: Secondary | ICD-10-CM | POA: Diagnosis not present

## 2015-09-28 DIAGNOSIS — F17210 Nicotine dependence, cigarettes, uncomplicated: Secondary | ICD-10-CM | POA: Diagnosis not present

## 2015-09-28 DIAGNOSIS — N179 Acute kidney failure, unspecified: Principal | ICD-10-CM | POA: Insufficient documentation

## 2015-09-28 DIAGNOSIS — Z992 Dependence on renal dialysis: Secondary | ICD-10-CM | POA: Diagnosis not present

## 2015-09-28 DIAGNOSIS — I10 Essential (primary) hypertension: Secondary | ICD-10-CM

## 2015-09-28 DIAGNOSIS — N189 Chronic kidney disease, unspecified: Secondary | ICD-10-CM

## 2015-09-28 DIAGNOSIS — R0602 Shortness of breath: Secondary | ICD-10-CM | POA: Diagnosis not present

## 2015-09-28 DIAGNOSIS — F250 Schizoaffective disorder, bipolar type: Secondary | ICD-10-CM | POA: Diagnosis not present

## 2015-09-28 DIAGNOSIS — Z79899 Other long term (current) drug therapy: Secondary | ICD-10-CM | POA: Insufficient documentation

## 2015-09-28 DIAGNOSIS — I120 Hypertensive chronic kidney disease with stage 5 chronic kidney disease or end stage renal disease: Secondary | ICD-10-CM | POA: Diagnosis not present

## 2015-09-28 DIAGNOSIS — I509 Heart failure, unspecified: Secondary | ICD-10-CM | POA: Diagnosis not present

## 2015-09-28 DIAGNOSIS — E8779 Other fluid overload: Secondary | ICD-10-CM

## 2015-09-28 DIAGNOSIS — I132 Hypertensive heart and chronic kidney disease with heart failure and with stage 5 chronic kidney disease, or end stage renal disease: Secondary | ICD-10-CM | POA: Insufficient documentation

## 2015-09-28 DIAGNOSIS — D631 Anemia in chronic kidney disease: Secondary | ICD-10-CM

## 2015-09-28 DIAGNOSIS — N186 End stage renal disease: Secondary | ICD-10-CM | POA: Insufficient documentation

## 2015-09-28 LAB — I-STAT CHEM 8, ED
BUN: 52 mg/dL — AB (ref 6–20)
CHLORIDE: 98 mmol/L — AB (ref 101–111)
CREATININE: 10 mg/dL — AB (ref 0.44–1.00)
Calcium, Ion: 1.11 mmol/L — ABNORMAL LOW (ref 1.12–1.23)
Glucose, Bld: 123 mg/dL — ABNORMAL HIGH (ref 65–99)
HEMATOCRIT: 26 % — AB (ref 36.0–46.0)
Hemoglobin: 8.8 g/dL — ABNORMAL LOW (ref 12.0–15.0)
Potassium: 3.7 mmol/L (ref 3.5–5.1)
SODIUM: 135 mmol/L (ref 135–145)
TCO2: 26 mmol/L (ref 0–100)

## 2015-09-28 LAB — RENAL FUNCTION PANEL
ANION GAP: 13 (ref 5–15)
Albumin: 3 g/dL — ABNORMAL LOW (ref 3.5–5.0)
BUN: 62 mg/dL — ABNORMAL HIGH (ref 6–20)
CALCIUM: 9 mg/dL (ref 8.9–10.3)
CO2: 26 mmol/L (ref 22–32)
Chloride: 97 mmol/L — ABNORMAL LOW (ref 101–111)
Creatinine, Ser: 10.26 mg/dL — ABNORMAL HIGH (ref 0.44–1.00)
GFR calc Af Amer: 5 mL/min — ABNORMAL LOW (ref >60)
GFR calc non Af Amer: 4 mL/min — ABNORMAL LOW (ref >60)
GLUCOSE: 117 mg/dL — AB (ref 65–99)
Phosphorus: 8.8 mg/dL — ABNORMAL HIGH (ref 2.5–4.6)
Potassium: 3.9 mmol/L (ref 3.5–5.1)
SODIUM: 136 mmol/L (ref 135–145)

## 2015-09-28 LAB — CBC
HCT: 24 % — ABNORMAL LOW (ref 36.0–46.0)
HEMOGLOBIN: 7.9 g/dL — AB (ref 12.0–15.0)
MCH: 28.8 pg (ref 26.0–34.0)
MCHC: 32.9 g/dL (ref 30.0–36.0)
MCV: 87.6 fL (ref 78.0–100.0)
Platelets: 194 10*3/uL (ref 150–400)
RBC: 2.74 MIL/uL — ABNORMAL LOW (ref 3.87–5.11)
RDW: 16.6 % — ABNORMAL HIGH (ref 11.5–15.5)
WBC: 7.2 10*3/uL (ref 4.0–10.5)

## 2015-09-28 MED ORDER — METOPROLOL TARTRATE 50 MG PO TABS
50.0000 mg | ORAL_TABLET | Freq: Two times a day (BID) | ORAL | Status: DC
  Administered 2015-09-28: 50 mg via ORAL
  Filled 2015-09-28: qty 1

## 2015-09-28 MED ORDER — LIDOCAINE HCL (PF) 1 % IJ SOLN
INTRAMUSCULAR | Status: AC
  Administered 2015-09-28: 5 mL via INTRADERMAL
  Filled 2015-09-28: qty 5

## 2015-09-28 MED ORDER — ALBUTEROL SULFATE (2.5 MG/3ML) 0.083% IN NEBU
2.5000 mg | INHALATION_SOLUTION | RESPIRATORY_TRACT | Status: DC | PRN
Start: 2015-09-28 — End: 2015-09-28

## 2015-09-28 MED ORDER — SODIUM CHLORIDE 0.9 % IV SOLN: 100.0000 mL | INTRAVENOUS | Status: DC | PRN

## 2015-09-28 MED ORDER — EPOETIN ALFA 20000 UNIT/ML IJ SOLN
14000.0000 [IU] | INTRAMUSCULAR | Status: DC
  Filled 2015-09-28 (×2): qty 1

## 2015-09-28 MED ORDER — DIPHENHYDRAMINE HCL 25 MG PO CAPS
ORAL_CAPSULE | ORAL | Status: AC
  Filled 2015-09-28: qty 1

## 2015-09-28 MED ORDER — EPOETIN ALFA 4000 UNIT/ML IJ SOLN
INTRAMUSCULAR | Status: AC
  Administered 2015-09-28: 4000 [IU]
  Filled 2015-09-28: qty 1

## 2015-09-28 MED ORDER — LIDOCAINE-PRILOCAINE 2.5-2.5 % EX CREA: 1.0000 "application " | TOPICAL_CREAM | CUTANEOUS | Status: DC | PRN

## 2015-09-28 MED ORDER — HEPARIN SODIUM (PORCINE) 1000 UNIT/ML DIALYSIS
1000.0000 [IU] | INTRAMUSCULAR | Status: DC | PRN
  Filled 2015-09-28: qty 1

## 2015-09-28 MED ORDER — ONDANSETRON HCL 4 MG/2ML IJ SOLN
4.0000 mg | Freq: Four times a day (QID) | INTRAMUSCULAR | Status: DC | PRN
Start: 2015-09-28 — End: 2015-09-28

## 2015-09-28 MED ORDER — ACETAMINOPHEN 325 MG PO TABS
ORAL_TABLET | ORAL | Status: AC
  Filled 2015-09-28: qty 2

## 2015-09-28 MED ORDER — ACETAMINOPHEN 325 MG PO TABS
650.0000 mg | ORAL_TABLET | Freq: Four times a day (QID) | ORAL | Status: DC | PRN
Start: 2015-09-28 — End: 2015-09-28
  Administered 2015-09-28: 650 mg via ORAL

## 2015-09-28 MED ORDER — HYDRALAZINE HCL 10 MG PO TABS: 10.0000 mg | ORAL_TABLET | Freq: Three times a day (TID) | ORAL | Status: DC

## 2015-09-28 MED ORDER — SODIUM CHLORIDE 0.9% FLUSH: 3.0000 mL | Freq: Two times a day (BID) | INTRAVENOUS | Status: DC

## 2015-09-28 MED ORDER — ACETAMINOPHEN 650 MG RE SUPP: 650.0000 mg | Freq: Four times a day (QID) | RECTAL | Status: DC | PRN

## 2015-09-28 MED ORDER — PREDNISONE 20 MG PO TABS: 20.0000 mg | ORAL_TABLET | Freq: Every day | ORAL | Status: DC

## 2015-09-28 MED ORDER — TRAMADOL HCL 50 MG PO TABS
ORAL_TABLET | ORAL | Status: AC
  Filled 2015-09-28: qty 1

## 2015-09-28 MED ORDER — HEPARIN SODIUM (PORCINE) 5000 UNIT/ML IJ SOLN
5000.0000 [IU] | Freq: Three times a day (TID) | INTRAMUSCULAR | Status: DC
  Filled 2015-09-28: qty 1

## 2015-09-28 MED ORDER — FUROSEMIDE 80 MG PO TABS
120.0000 mg | ORAL_TABLET | Freq: Every day | ORAL | Status: DC
  Administered 2015-09-28: 120 mg via ORAL
  Filled 2015-09-28: qty 1

## 2015-09-28 MED ORDER — HEPARIN SODIUM (PORCINE) 1000 UNIT/ML DIALYSIS: 20.0000 [IU]/kg | INTRAMUSCULAR | Status: DC | PRN

## 2015-09-28 MED ORDER — ONDANSETRON HCL 4 MG PO TABS: 4.0000 mg | ORAL_TABLET | Freq: Four times a day (QID) | ORAL | Status: DC | PRN

## 2015-09-28 MED ORDER — EPOETIN ALFA 10000 UNIT/ML IJ SOLN
INTRAMUSCULAR | Status: AC
  Administered 2015-09-28: 10000 [IU]
  Filled 2015-09-28: qty 1

## 2015-09-28 MED ORDER — ALTEPLASE 2 MG IJ SOLR: 2.0000 mg | Freq: Once | INTRAMUSCULAR | Status: DC | PRN

## 2015-09-28 MED ORDER — DIPHENHYDRAMINE HCL 25 MG PO CAPS
25.0000 mg | ORAL_CAPSULE | Freq: Four times a day (QID) | ORAL | Status: DC | PRN
  Administered 2015-09-28: 25 mg via ORAL

## 2015-09-28 MED ORDER — HYDRALAZINE HCL 20 MG/ML IJ SOLN: 5.0000 mg | INTRAMUSCULAR | Status: DC | PRN

## 2015-09-28 MED ORDER — PENTAFLUOROPROP-TETRAFLUOROETH EX AERO: 1.0000 "application " | INHALATION_SPRAY | CUTANEOUS | Status: DC | PRN

## 2015-09-28 MED ORDER — LIDOCAINE HCL (PF) 1 % IJ SOLN
5.0000 mL | INTRAMUSCULAR | Status: DC | PRN
  Administered 2015-09-28: 5 mL via INTRADERMAL

## 2015-09-28 NOTE — Progress Notes (Signed)
Pt request standing weight on standing scale instead of bed weight post HD. Pt informed 3rd floor staff.

## 2015-09-28 NOTE — Discharge Summary (Signed)
Physician Discharge Summary  Sara [name] [name] DOB: 1980-07-19 DOA: 09/28/2015  PCP: No primary care provider on file.  Admit date: 09/28/2015 Discharge date: 09/28/2015  Time spent: 25 minutes  Recommendations for Outpatient Follow-up:  1. Patient left the hospital without follow-up   Discharge Diagnoses:  Principal Problem:   Admission for dialysis [facility]) Active Problems:   Hypervolemia   ESRD on hemodialysis ([place])   Essential hypertension   Schizoaffective disorder, bipolar type ([place])   Anemia in ESRD (end-stage renal disease) ([place])   Discharge Condition: Presumed to be stable; patient left the hospital following the completion of hemodialysis.  Diet recommendation: Heart healthy  Filed Weights   09/28/15 1429  Weight: 93.1 kg (205 lb 4 oz)    History of present illness:  Sara [name] is a 35 y.o. female with a medical history significant for end-stage renal disease on hemodialysis and schizoaffective disorder/bipolar disorder, who is well-known to the [facility] system. She has been barred from all other outpatient hemodialysis centers due to her behavior in the past. She presented to the ED today for routine hemodialysis. She acknowledged some shortness of breath and progressive swelling in her legs when asked. She had no other complaints. In the ED, she was afebrile and mildly tachycardic with a heart rate of 110. She was mildly hypertensive with a blood pressure 146/116. She was oxygenating 98% on room air. Her lab data were significant for hemoglobin of 8.8, BUN of 52, creatinine of 10, and glucose of 123. Her chest x-ray revealed cardiac enlargement and vascular congestion without definite pulmonary edema. She was admitted for dialysis and supportive treatment of her chronic diseases.  Hospital Course:  Nephrologist, Dr. Lowanda Foster was consulted. He arranged for the patient to receive hemodialysis. She was restarted on all of her  chronic medications. As needed IV hydralazine was ordered for accelerated hypertension. Her blood pressure improved to 124/78 following hemodialysis. Following the completion of hemodialysis, the patient left the hospital.  Procedures:  Hemodialysis   Consultations:  Nephrology  Discharge Exam: Filed Vitals:   09/28/15 1738 09/28/15 1753  BP: 138/79 124/78  Pulse: 104 102  Temp:  98.6 F (37 C)  Resp:  15   Patient not examined as she left the hospital following the completion of hemodialysis. See admission history and physical.  Discharge Instructions    Discharge Medication List as of 09/28/2015  6:22 PM    CONTINUE these medications which have NOT CHANGED   Details  acetaminophen (TYLENOL) 500 MG tablet Take 1,000 mg by mouth every 6 (six) hours as needed for moderate pain. Reported on 06/12/2015, Until Discontinued, Historical Med    furosemide (LASIX) 40 MG tablet Take 3 tablets (120 mg total) by mouth daily., Starting 09/26/2015, Until Discontinued, Normal    hydrALAZINE (APRESOLINE) 10 MG tablet Take 1 tablet (10 mg total) by mouth 3 (three) times daily., Starting 08/11/2015, Until Discontinued, Print    metoprolol (LOPRESSOR) 50 MG tablet Take 1 tablet (50 mg total) by mouth 2 (two) times daily., Starting 08/11/2015, Until Discontinued, Print    predniSONE (DELTASONE) 20 MG tablet Take 1 tablet (20 mg total) by mouth daily with breakfast., Starting 09/26/2015, Until Discontinued, Normal       Allergies  Allergen Reactions  . Ativan [Lorazepam] Swelling and Other (See Comments)    Dysarthria(patient has difficulty speaking and slurred speech); denies swelling, itching, pain, or numbness.  [name] [Ziprasidone Hcl] Itching and Swelling    Tongue swelling  .  Keflex [Cephalexin] Swelling and Other (See Comments)    Tongue swelling. Can't talk   . Haldol [Haloperidol Lactate] Swelling    Tongue swelling. 05/31/15 - MD ok with giving as pt has tolerated in the past Pt can  take benadryl.  . Other Itching    wool      The results of significant diagnostics from this hospitalization (including imaging, microbiology, ancillary and laboratory) are listed below for reference.    Significant Diagnostic Studies: Dg Chest 2 View  09/28/2015  CLINICAL DATA:  Sob today/smoker/htn/ EXAM: CHEST  2 VIEW COMPARISON:  08/22/2015 FINDINGS: Moderate cardiac enlargement with mild to moderate vascular congestion. This is similar to the prior study. No definite pulmonary edema, but minimal blunting of right costophrenic angle is noted which could indicate chronic thickening or tiny effusion. If this finding is unchanged there is progressive osseous hypertrophy with erosive change of both acromioclavicular joints. IMPRESSION: Cardiac enlargement and vascular congestion without definite pulmonary edema. Progressive acromioclavicular joint abnormality bilaterally with differential diagnosis including hyperparathyroidism, rheumatoid arthritis, scleroderma, and bilateral trauma. Electronically Signed   By: Skipper Cliche M.D.   On: 09/28/2015 10:24    Microbiology: No results found for this or any previous visit (from the past 240 hour(s)).   Labs: Basic Metabolic Panel:  Recent Labs Lab 09/26/15 1143 09/28/15 0916 09/28/15 1228  NA 135 135 136  K 5.0 3.7 3.9  CL 97* 98* 97*  CO2 21*  --  26  GLUCOSE 86 123* 117*  BUN 104* 52* 62*  CREATININE 13.53* 10.00* 10.26*  CALCIUM 9.3  --  9.0  PHOS  --   --  8.8*   Liver Function Tests:  Recent Labs Lab 09/28/15 1228  ALBUMIN 3.0*   No results for input(s): LIPASE, AMYLASE in the last 168 hours. No results for input(s): AMMONIA in the last 168 hours. CBC:  Recent Labs Lab 09/26/15 1143 09/28/15 0916 09/28/15 1228  WBC 9.3  --  7.2  HGB 7.9* 8.8* 7.9*  HCT 23.6* 26.0* 24.0*  MCV 86.4  --  87.6  PLT 252  --  194   Cardiac Enzymes: No results for input(s): CKTOTAL, CKMB, CKMBINDEX, TROPONINI in the last 168  hours. BNP: BNP (last 3 results) No results for input(s): BNP in the last 8760 hours.  ProBNP (last 3 results) No results for input(s): PROBNP in the last 8760 hours.  CBG: No results for input(s): GLUCAP in the last 168 hours.     Signed:  Orvie Caradine MD.  [facility] 09/28/2015, 6:49 PM

## 2015-09-28 NOTE — Progress Notes (Signed)
Hemodialysis- Pt signed off treatment AMA after approximatly 3.5 hours. Needles pulled and pressure held x10 minutes each. Report called to primary RN. Pt states she will leaving hospital.
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
## 2015-09-28 NOTE — ED Notes (Signed)
Pt here requesting dialysis. Pt last dialyzed on Wed. Reports increased swelling in abdomen.

## 2015-09-28 NOTE — Progress Notes (Signed)
Left AMA, Dr. Caryn Section notified.
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
## 2015-09-28 NOTE — ED Notes (Signed)
Pt refused IV access.

## 2015-10-01 ENCOUNTER — Observation Stay (HOSPITAL_COMMUNITY)
Admission: EM | Admit: 2015-10-01 | Discharge: 2015-10-01 | Disposition: A | Discharge status: Home / Self Care | Payer: Medicare Other | Attending: Internal Medicine | Admitting: Internal Medicine

## 2015-10-01 ENCOUNTER — Encounter (HOSPITAL_COMMUNITY): Payer: Self-pay | Admitting: Emergency Medicine

## 2015-10-01 DIAGNOSIS — N186 End stage renal disease: Secondary | ICD-10-CM | POA: Diagnosis not present

## 2015-10-01 DIAGNOSIS — Z4931 Encounter for adequacy testing for hemodialysis: Secondary | ICD-10-CM | POA: Diagnosis present

## 2015-10-01 DIAGNOSIS — I120 Hypertensive chronic kidney disease with stage 5 chronic kidney disease or end stage renal disease: Secondary | ICD-10-CM | POA: Diagnosis not present

## 2015-10-01 DIAGNOSIS — F17210 Nicotine dependence, cigarettes, uncomplicated: Secondary | ICD-10-CM | POA: Insufficient documentation

## 2015-10-01 DIAGNOSIS — I10 Essential (primary) hypertension: Secondary | ICD-10-CM | POA: Diagnosis not present

## 2015-10-01 DIAGNOSIS — Z720 Tobacco use: Secondary | ICD-10-CM

## 2015-10-01 DIAGNOSIS — E8779 Other fluid overload: Secondary | ICD-10-CM | POA: Diagnosis not present

## 2015-10-01 DIAGNOSIS — Z79899 Other long term (current) drug therapy: Secondary | ICD-10-CM | POA: Diagnosis not present

## 2015-10-01 DIAGNOSIS — F250 Schizoaffective disorder, bipolar type: Secondary | ICD-10-CM | POA: Diagnosis not present

## 2015-10-01 DIAGNOSIS — E8770 Fluid overload, unspecified: Principal | ICD-10-CM | POA: Diagnosis present

## 2015-10-01 DIAGNOSIS — Z992 Dependence on renal dialysis: Secondary | ICD-10-CM

## 2015-10-01 DIAGNOSIS — D631 Anemia in chronic kidney disease: Secondary | ICD-10-CM

## 2015-10-01 LAB — I-STAT CHEM 8, ED
BUN: 57 mg/dL — AB (ref 6–20)
CHLORIDE: 98 mmol/L — AB (ref 101–111)
CREATININE: 10.4 mg/dL — AB (ref 0.44–1.00)
Calcium, Ion: 1.15 mmol/L (ref 1.12–1.23)
Glucose, Bld: 97 mg/dL (ref 65–99)
HCT: 27 % — ABNORMAL LOW (ref 36.0–46.0)
HEMOGLOBIN: 9.2 g/dL — AB (ref 12.0–15.0)
Potassium: 4.1 mmol/L (ref 3.5–5.1)
SODIUM: 136 mmol/L (ref 135–145)
TCO2: 25 mmol/L (ref 0–100)

## 2015-10-01 LAB — RENAL FUNCTION PANEL
ALBUMIN: 3.1 g/dL — AB (ref 3.5–5.0)
ANION GAP: 15 (ref 5–15)
BUN: 64 mg/dL — ABNORMAL HIGH (ref 6–20)
CHLORIDE: 97 mmol/L — AB (ref 101–111)
CO2: 24 mmol/L (ref 22–32)
Calcium: 9.1 mg/dL (ref 8.9–10.3)
Creatinine, Ser: 10.85 mg/dL — ABNORMAL HIGH (ref 0.44–1.00)
GFR calc Af Amer: 5 mL/min — ABNORMAL LOW (ref >60)
GFR calc non Af Amer: 4 mL/min — ABNORMAL LOW (ref >60)
GLUCOSE: 99 mg/dL (ref 65–99)
PHOSPHORUS: 8.8 mg/dL — AB (ref 2.5–4.6)
POTASSIUM: 4.2 mmol/L (ref 3.5–5.1)
Sodium: 136 mmol/L (ref 135–145)

## 2015-10-01 LAB — CBC
HEMATOCRIT: 24.8 % — AB (ref 36.0–46.0)
HEMOGLOBIN: 8 g/dL — AB (ref 12.0–15.0)
MCH: 29.3 pg (ref 26.0–34.0)
MCHC: 32.3 g/dL (ref 30.0–36.0)
MCV: 90.8 fL (ref 78.0–100.0)
Platelets: 192 10*3/uL (ref 150–400)
RBC: 2.73 MIL/uL — ABNORMAL LOW (ref 3.87–5.11)
RDW: 16.6 % — ABNORMAL HIGH (ref 11.5–15.5)
WBC: 6.8 10*3/uL (ref 4.0–10.5)

## 2015-10-01 MED ORDER — SODIUM CHLORIDE 0.9 % IV SOLN: 100.0000 mL | INTRAVENOUS | Status: DC | PRN

## 2015-10-01 MED ORDER — FUROSEMIDE 80 MG PO TABS: 120.0000 mg | ORAL_TABLET | Freq: Every day | ORAL | Status: DC

## 2015-10-01 MED ORDER — HYDRALAZINE HCL 10 MG PO TABS
10.0000 mg | ORAL_TABLET | Freq: Three times a day (TID) | ORAL | Status: DC
  Administered 2015-10-01: 10 mg via ORAL
  Filled 2015-10-01: qty 1

## 2015-10-01 MED ORDER — ACETAMINOPHEN 650 MG RE SUPP
650.0000 mg | Freq: Four times a day (QID) | RECTAL | Status: DC | PRN
Start: 2015-10-01 — End: 2015-10-01

## 2015-10-01 MED ORDER — PREDNISONE 20 MG PO TABS: 20.0000 mg | ORAL_TABLET | Freq: Every day | ORAL | Status: DC

## 2015-10-01 MED ORDER — METOPROLOL TARTRATE 50 MG PO TABS: 50.0000 mg | ORAL_TABLET | Freq: Two times a day (BID) | ORAL | Status: DC

## 2015-10-01 MED ORDER — TRAMADOL HCL 50 MG PO TABS
ORAL_TABLET | ORAL | Status: AC
  Administered 2015-10-01: 50 mg via ORAL
  Filled 2015-10-01: qty 1

## 2015-10-01 MED ORDER — EPOETIN ALFA 20000 UNIT/ML IJ SOLN
INTRAMUSCULAR | Status: AC
  Administered 2015-10-01: 14000 [IU] via SUBCUTANEOUS
  Filled 2015-10-01: qty 1

## 2015-10-01 MED ORDER — HEPARIN SODIUM (PORCINE) 1000 UNIT/ML IJ SOLN
INTRAMUSCULAR | Status: AC
Start: 2015-10-01 — End: 2015-10-01
  Administered 2015-10-01: 1500 [IU] via INTRAVENOUS_CENTRAL
  Filled 2015-10-01: qty 6

## 2015-10-01 MED ORDER — LIDOCAINE HCL (PF) 1 % IJ SOLN
5.0000 mL | INTRAMUSCULAR | Status: DC | PRN
  Administered 2015-10-01: 0.5 mL via INTRADERMAL

## 2015-10-01 MED ORDER — LIDOCAINE-PRILOCAINE 2.5-2.5 % EX CREA: 1.0000 "application " | TOPICAL_CREAM | CUTANEOUS | Status: DC | PRN

## 2015-10-01 MED ORDER — ALBUTEROL SULFATE (2.5 MG/3ML) 0.083% IN NEBU: 2.5000 mg | INHALATION_SOLUTION | RESPIRATORY_TRACT | Status: DC | PRN

## 2015-10-01 MED ORDER — HEPARIN SODIUM (PORCINE) 1000 UNIT/ML DIALYSIS
20.0000 [IU]/kg | INTRAMUSCULAR | Status: DC | PRN
  Administered 2015-10-01: 1500 [IU] via INTRAVENOUS_CENTRAL

## 2015-10-01 MED ORDER — PENTAFLUOROPROP-TETRAFLUOROETH EX AERO: 1.0000 "application " | INHALATION_SPRAY | CUTANEOUS | Status: DC | PRN

## 2015-10-01 MED ORDER — LIDOCAINE HCL (PF) 1 % IJ SOLN
INTRAMUSCULAR | Status: AC
  Administered 2015-10-01: 0.5 mL via INTRADERMAL
  Filled 2015-10-01: qty 5

## 2015-10-01 MED ORDER — ACETAMINOPHEN 325 MG PO TABS
650.0000 mg | ORAL_TABLET | Freq: Four times a day (QID) | ORAL | Status: DC | PRN
Start: 2015-10-01 — End: 2015-10-01

## 2015-10-01 MED ORDER — VITAMIN C 500 MG PO TABS: 500.0000 mg | ORAL_TABLET | Freq: Every day | ORAL | Status: DC

## 2015-10-01 MED ORDER — HEPARIN SODIUM (PORCINE) 5000 UNIT/ML IJ SOLN: 5000.0000 [IU] | Freq: Three times a day (TID) | INTRAMUSCULAR | Status: DC

## 2015-10-01 MED ORDER — ONDANSETRON HCL 4 MG/2ML IJ SOLN: 4.0000 mg | Freq: Three times a day (TID) | INTRAMUSCULAR | Status: DC | PRN

## 2015-10-01 MED ORDER — TRAMADOL HCL 50 MG PO TABS
50.0000 mg | ORAL_TABLET | Freq: Four times a day (QID) | ORAL | Status: DC | PRN
  Administered 2015-10-01: 50 mg via ORAL

## 2015-10-01 MED ORDER — EPOETIN ALFA 20000 UNIT/ML IJ SOLN
14000.0000 [IU] | INTRAMUSCULAR | Status: DC
  Administered 2015-10-01: 14000 [IU] via SUBCUTANEOUS
  Filled 2015-10-01 (×2): qty 1

## 2015-10-01 NOTE — ED Notes (Signed)
EDP at bedside  
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
## 2015-10-01 NOTE — ED Notes (Signed)
Pt refusing IV 

## 2015-10-01 NOTE — ED Notes (Signed)
Patient is here for dialysis. Patient has swelling noted abdominal area and bilateral lower extremities. Denies pain or shortness of breath.

## 2015-10-01 NOTE — Progress Notes (Signed)
Patient left dialysis early and when came back up to the floor stated would wait for MD to discharge her but decided against it. Patient signed out AMA and ambulated off of floor.

## 2015-10-01 NOTE — ED Notes (Signed)
Attempted report, receiving RN reported would call back for report.
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
## 2015-10-01 NOTE — Procedures (Signed)
   HEMODIALYSIS TREATMENT NOTE:  4.5 hour low-heparin dialysis ordered with goal of 4 liters.  HD initiated without problems.  Goal NOT met: Pt ended HD session after 2 hours and 11 minutes. Net UF 2.1 liters.  Pt states she "couldn't make it because my arm is hurting more than usual."  Tramadol 41m given earlier for c/o back pain provided no relief for arm pain.  Complications of malignant hypertension and hypervolemia were discussed.  Renal diet, sodium/fluid restriction were encouraged. All blood was returned and hemostasis was achieved within 15 minutes. Report called to MGershon Crane RN.  Dr. BLowanda Fosterwas notified as well.  ARockwell Alexandria RN, CDN

## 2015-10-01 NOTE — Discharge Summary (Signed)
Physician Discharge Summary  SAYLEM BARGAS [id number] DOB: 11-May-1980 DOA: 10/01/2015  PCP: No primary care provider on file.  Admit date: 10/01/2015 Discharge date: 10/01/2015  Time spent: Less than 30 minutes  Recommendations for Outpatient Follow-up:  1. The patient left AMA following hemodialysis.     Discharge Diagnoses:  Principal Problem:   Admission for dialysis [facility]) Active Problems:   ESRD on hemodialysis ([place])   Fluid overload   Tobacco abuse   Essential hypertension   Anemia in end-stage renal disease ([place])   Discharge Condition: Reported to be improved.  Diet recommendation: Heart healthy.  Filed Weights   10/01/15 0748 10/01/15 1022 10/01/15 1335  Weight: 72.576 kg (160 lb) 93.895 kg (207 lb) 93.9 kg (207 lb 0.2 oz)    History of present illness:  Sara [name] is a 35 y.o. female with medical history significant for end-stage renal disease on hemodialysis hypertension, and schizoaffective disorder/bipolar disorder, who is well-known to the [facility] system. She has been barred from all other outpatient hemodialysis centers due to her behavior in the past. She presented to the ED today for routine hemodialysis. She denied shortness of breath, but did have some swelling in her legs when asked. Her only complaint was of achy low back pain which comes and goes, but not associated with leg weakness, numbness of her extremities, or bowel/bladder incontinence. Tramadol had helped in the past. ED Course: The patient had been seen in the ED 74 times and was admitted 43 times in the past 6 months. During this admission, in the ED, she was afebrile and mildly tachycardic with a heart rate in the 110 -120 range and hypertensive with a blood pressure 143/113. Her lab data were significant for BUN of 64, creatinine of 10.4 and hemoglobin of 9.2. She was admitted for dialysis and maintenance treatment of her chronic conditions.  Hospital Course:   The patient was restarted on all of her chronic medications. Tramadol was added as needed for back pain. Nephrologist, Dr. Lowanda Foster was consulted. He wrote orders for hemodialysis. The patient underwent hemodialysis, but she ended it early due to arm pain. The dictating physician was going to reevaluate her following hemodialysis, but she left [place].  Procedures:  Hemodialysis  Consultations:  Nephrology  Discharge Exam: Filed Vitals:   10/01/15 1530 10/01/15 1600  BP: 157/102 160/99  Pulse: 99 100  Temp:    Resp:     Discharge exam not done as the patient left AMA.  Discharge Instructions    Discharge Medication List as of 10/01/2015  4:59 PM    CONTINUE these medications which have NOT CHANGED   Details  acetaminophen (TYLENOL) 500 MG tablet Take 1,000 mg by mouth every 6 (six) hours as needed for moderate pain. Reported on 06/12/2015, Until Discontinued, Historical Med    Ascorbic Acid (VITAMIN C PO) Take 1 tablet by mouth daily., Until Discontinued, Historical Med    furosemide (LASIX) 40 MG tablet Take 3 tablets (120 mg total) by mouth daily., Starting 09/26/2015, Until Discontinued, Normal    metoprolol (LOPRESSOR) 50 MG tablet Take 1 tablet (50 mg total) by mouth 2 (two) times daily., Starting 08/11/2015, Until Discontinued, Print    predniSONE (DELTASONE) 20 MG tablet Take 1 tablet (20 mg total) by mouth daily with breakfast., Starting 09/26/2015, Until Discontinued, Normal    hydrALAZINE (APRESOLINE) 10 MG tablet Take 1 tablet (10 mg total) by mouth 3 (three) times daily., Starting 08/11/2015, Until Discontinued, Print  Allergies  Allergen Reactions  . Ativan [Lorazepam] Swelling and Other (See Comments)    Dysarthria(patient has difficulty speaking and slurred speech); denies swelling, itching, pain, or numbness.  [name] [Ziprasidone Hcl] Itching and Swelling    Tongue swelling  . Keflex [Cephalexin] Swelling and Other (See Comments)     Tongue swelling. Can't talk   . Haldol [Haloperidol Lactate] Swelling    Tongue swelling. 05/31/15 - MD ok with giving as pt has tolerated in the past Pt can take benadryl.  . Other Itching    wool      The results of significant diagnostics from this hospitalization (including imaging, microbiology, ancillary and laboratory) are listed below for reference.    Significant Diagnostic Studies: Dg Chest 2 View  09/28/2015  CLINICAL DATA:  Sob today/smoker/htn/ EXAM: CHEST  2 VIEW COMPARISON:  08/22/2015 FINDINGS: Moderate cardiac enlargement with mild to moderate vascular congestion. This is similar to the prior study. No definite pulmonary edema, but minimal blunting of right costophrenic angle is noted which could indicate chronic thickening or tiny effusion. If this finding is unchanged there is progressive osseous hypertrophy with erosive change of both acromioclavicular joints. IMPRESSION: Cardiac enlargement and vascular congestion without definite pulmonary edema. Progressive acromioclavicular joint abnormality bilaterally with differential diagnosis including hyperparathyroidism, rheumatoid arthritis, scleroderma, and bilateral trauma. Electronically Signed   By: Skipper Cliche M.D.   On: 09/28/2015 10:24    Microbiology: No results found for this or any previous visit (from the past 240 hour(s)).   Labs: Basic Metabolic Panel:  Recent Labs Lab 09/26/15 1143 09/28/15 0916 09/28/15 1228 10/01/15 0826  NA 135 135 136 136  136  K 5.0 3.7 3.9 4.2  4.1  CL 97* 98* 97* 97*  98*  CO2 21*  --  26 24  GLUCOSE 86 123* 117* 99  97  BUN 104* 52* 62* 64*  57*  CREATININE 13.53* 10.00* 10.26* 10.85*  10.40*  CALCIUM 9.3  --  9.0 9.1  PHOS  --   --  8.8* 8.8*   Liver Function Tests:  Recent Labs Lab 09/28/15 1228 10/01/15 0826  ALBUMIN 3.0* 3.1*   No results for input(s): LIPASE, AMYLASE in the last 168 hours. No results for input(s): AMMONIA in the last 168  hours. CBC:  Recent Labs Lab 09/26/15 1143 09/28/15 0916 09/28/15 1228 10/01/15 0826  WBC 9.3  --  7.2 6.8  HGB 7.9* 8.8* 7.9* 8.0*  9.2*  HCT 23.6* 26.0* 24.0* 24.8*  27.0*  MCV 86.4  --  87.6 90.8  PLT 252  --  194 192   Cardiac Enzymes: No results for input(s): CKTOTAL, CKMB, CKMBINDEX, TROPONINI in the last 168 hours. BNP: BNP (last 3 results) No results for input(s): BNP in the last 8760 hours.  ProBNP (last 3 results) No results for input(s): PROBNP in the last 8760 hours.  CBG: No results for input(s): GLUCAP in the last 168 hours.     Signed:  Heron Pitcock MD.  [facility] 10/01/2015, 5:07 PM

## 2015-10-01 NOTE — ED Notes (Signed)
Attempted to obtain I-stat chem 8 x2. Other ER staff attempting to obtain blood sample.
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
## 2015-10-04 ENCOUNTER — Emergency Department (HOSPITAL_COMMUNITY)
Admission: EM | Admit: 2015-10-04 | Discharge: 2015-10-04 | Disposition: A | Discharge status: Home / Self Care | Payer: Medicare Other | Attending: Emergency Medicine | Admitting: Emergency Medicine

## 2015-10-04 ENCOUNTER — Encounter (HOSPITAL_COMMUNITY): Payer: Self-pay | Admitting: Cardiology

## 2015-10-04 DIAGNOSIS — N186 End stage renal disease: Secondary | ICD-10-CM | POA: Diagnosis not present

## 2015-10-04 DIAGNOSIS — I509 Heart failure, unspecified: Secondary | ICD-10-CM | POA: Insufficient documentation

## 2015-10-04 DIAGNOSIS — Z79899 Other long term (current) drug therapy: Secondary | ICD-10-CM | POA: Diagnosis not present

## 2015-10-04 DIAGNOSIS — Z5321 Procedure and treatment not carried out due to patient leaving prior to being seen by health care provider: Secondary | ICD-10-CM | POA: Insufficient documentation

## 2015-10-04 DIAGNOSIS — I132 Hypertensive heart and chronic kidney disease with heart failure and with stage 5 chronic kidney disease, or end stage renal disease: Secondary | ICD-10-CM | POA: Insufficient documentation

## 2015-10-04 DIAGNOSIS — F17210 Nicotine dependence, cigarettes, uncomplicated: Secondary | ICD-10-CM | POA: Diagnosis not present

## 2015-10-04 DIAGNOSIS — Z992 Dependence on renal dialysis: Secondary | ICD-10-CM | POA: Insufficient documentation

## 2015-10-04 DIAGNOSIS — F250 Schizoaffective disorder, bipolar type: Secondary | ICD-10-CM | POA: Insufficient documentation

## 2015-10-04 NOTE — ED Notes (Signed)
No answer from lobby  

## 2015-10-04 NOTE — ED Provider Notes (Signed)
Patient eloped from the emergency department before provider evaluation. I did not see or evaluate this patient. Triage note states pt is here for dialysis.     Jarrett Soho Briseidy Spark, PA-C 10/04/15 [place], MD 10/04/15 [phone]

## 2015-10-04 NOTE — ED Notes (Signed)
Here for dialysis

## 2015-10-05 ENCOUNTER — Encounter (HOSPITAL_COMMUNITY): Payer: Self-pay | Admitting: Emergency Medicine

## 2015-10-05 ENCOUNTER — Observation Stay (HOSPITAL_COMMUNITY)
Admission: EM | Admit: 2015-10-05 | Discharge: 2015-10-05 | Disposition: A | Discharge status: Home / Self Care | Payer: Medicare Other | Attending: Internal Medicine | Admitting: Internal Medicine

## 2015-10-05 DIAGNOSIS — I10 Essential (primary) hypertension: Secondary | ICD-10-CM | POA: Diagnosis present

## 2015-10-05 DIAGNOSIS — F17210 Nicotine dependence, cigarettes, uncomplicated: Secondary | ICD-10-CM | POA: Diagnosis not present

## 2015-10-05 DIAGNOSIS — D631 Anemia in chronic kidney disease: Secondary | ICD-10-CM | POA: Diagnosis not present

## 2015-10-05 DIAGNOSIS — F209 Schizophrenia, unspecified: Secondary | ICD-10-CM | POA: Insufficient documentation

## 2015-10-05 DIAGNOSIS — E8770 Fluid overload, unspecified: Secondary | ICD-10-CM | POA: Diagnosis not present

## 2015-10-05 DIAGNOSIS — I132 Hypertensive heart and chronic kidney disease with heart failure and with stage 5 chronic kidney disease, or end stage renal disease: Secondary | ICD-10-CM | POA: Insufficient documentation

## 2015-10-05 DIAGNOSIS — N186 End stage renal disease: Secondary | ICD-10-CM | POA: Diagnosis not present

## 2015-10-05 DIAGNOSIS — Z992 Dependence on renal dialysis: Secondary | ICD-10-CM | POA: Diagnosis not present

## 2015-10-05 DIAGNOSIS — Z79899 Other long term (current) drug therapy: Secondary | ICD-10-CM | POA: Insufficient documentation

## 2015-10-05 DIAGNOSIS — I509 Heart failure, unspecified: Secondary | ICD-10-CM | POA: Insufficient documentation

## 2015-10-05 DIAGNOSIS — E8779 Other fluid overload: Secondary | ICD-10-CM | POA: Diagnosis not present

## 2015-10-05 DIAGNOSIS — N19 Unspecified kidney failure: Secondary | ICD-10-CM | POA: Diagnosis not present

## 2015-10-05 DIAGNOSIS — R0602 Shortness of breath: Secondary | ICD-10-CM | POA: Diagnosis not present

## 2015-10-05 DIAGNOSIS — Z8639 Personal history of other endocrine, nutritional and metabolic disease: Secondary | ICD-10-CM

## 2015-10-05 LAB — CBC
HCT: 24.8 % — ABNORMAL LOW (ref 36.0–46.0)
Hemoglobin: 8 g/dL — ABNORMAL LOW (ref 12.0–15.0)
MCH: 28.2 pg (ref 26.0–34.0)
MCHC: 32.3 g/dL (ref 30.0–36.0)
MCV: 87.3 fL (ref 78.0–100.0)
PLATELETS: 178 10*3/uL (ref 150–400)
RBC: 2.84 MIL/uL — ABNORMAL LOW (ref 3.87–5.11)
RDW: 17.5 % — AB (ref 11.5–15.5)
WBC: 9.4 10*3/uL (ref 4.0–10.5)

## 2015-10-05 LAB — I-STAT CHEM 8, ED
BUN: 82 mg/dL — AB (ref 6–20)
CREATININE: 13.4 mg/dL — AB (ref 0.44–1.00)
Calcium, Ion: 1.15 mmol/L (ref 1.13–1.30)
Chloride: 98 mmol/L — ABNORMAL LOW (ref 101–111)
GLUCOSE: 107 mg/dL — AB (ref 65–99)
HEMATOCRIT: 30 % — AB (ref 36.0–46.0)
HEMOGLOBIN: 10.2 g/dL — AB (ref 12.0–15.0)
Potassium: 4.2 mmol/L (ref 3.5–5.1)
Sodium: 136 mmol/L (ref 135–145)
TCO2: 25 mmol/L (ref 0–100)

## 2015-10-05 LAB — RENAL FUNCTION PANEL
ALBUMIN: 2.9 g/dL — AB (ref 3.5–5.0)
Anion gap: 14 (ref 5–15)
BUN: 94 mg/dL — AB (ref 6–20)
CALCIUM: 8.8 mg/dL — AB (ref 8.9–10.3)
CO2: 23 mmol/L (ref 22–32)
CREATININE: 13.52 mg/dL — AB (ref 0.44–1.00)
Chloride: 97 mmol/L — ABNORMAL LOW (ref 101–111)
GFR calc Af Amer: 4 mL/min — ABNORMAL LOW (ref >60)
GFR calc non Af Amer: 3 mL/min — ABNORMAL LOW (ref >60)
GLUCOSE: 130 mg/dL — AB (ref 65–99)
PHOSPHORUS: 10.9 mg/dL — AB (ref 2.5–4.6)
Potassium: 4.7 mmol/L (ref 3.5–5.1)
SODIUM: 134 mmol/L — AB (ref 135–145)

## 2015-10-05 MED ORDER — FUROSEMIDE 80 MG PO TABS
120.0000 mg | ORAL_TABLET | Freq: Every day | ORAL | Status: DC
  Administered 2015-10-05: 120 mg via ORAL
  Filled 2015-10-05: qty 1

## 2015-10-05 MED ORDER — PREDNISONE 20 MG PO TABS: 20.0000 mg | ORAL_TABLET | Freq: Every day | ORAL | Status: DC

## 2015-10-05 MED ORDER — PENTAFLUOROPROP-TETRAFLUOROETH EX AERO
INHALATION_SPRAY | CUTANEOUS | Status: AC
  Administered 2015-10-05: 1 via TOPICAL
  Filled 2015-10-05: qty 103.5

## 2015-10-05 MED ORDER — HEPARIN SODIUM (PORCINE) 1000 UNIT/ML DIALYSIS
1000.0000 [IU] | INTRAMUSCULAR | Status: DC | PRN
  Filled 2015-10-05: qty 1

## 2015-10-05 MED ORDER — PREDNISONE 20 MG PO TABS
20.0000 mg | ORAL_TABLET | Freq: Every day | ORAL | Status: DC
  Administered 2015-10-05: 20 mg via ORAL
  Filled 2015-10-05: qty 1

## 2015-10-05 MED ORDER — ENOXAPARIN SODIUM 30 MG/0.3ML ~~LOC~~ SOLN: 30.0000 mg | SUBCUTANEOUS | Status: DC

## 2015-10-05 MED ORDER — DIPHENHYDRAMINE HCL 25 MG PO CAPS: 25.0000 mg | ORAL_CAPSULE | Freq: Four times a day (QID) | ORAL | Status: DC | PRN

## 2015-10-05 MED ORDER — ALTEPLASE 2 MG IJ SOLR
2.0000 mg | Freq: Once | INTRAMUSCULAR | Status: DC | PRN
Start: 2015-10-05 — End: 2015-10-05

## 2015-10-05 MED ORDER — LIDOCAINE HCL (PF) 1 % IJ SOLN
INTRAMUSCULAR | Status: AC
  Administered 2015-10-05: 0.5 mL via INTRADERMAL
  Filled 2015-10-05: qty 5

## 2015-10-05 MED ORDER — SODIUM CHLORIDE 0.9 % IV SOLN: 100.0000 mL | INTRAVENOUS | Status: DC | PRN

## 2015-10-05 MED ORDER — ACETAMINOPHEN 500 MG PO TABS
1000.0000 mg | ORAL_TABLET | Freq: Four times a day (QID) | ORAL | Status: DC | PRN
Start: 2015-10-05 — End: 2015-10-05

## 2015-10-05 MED ORDER — HEPARIN SODIUM (PORCINE) 1000 UNIT/ML DIALYSIS
20.0000 [IU]/kg | INTRAMUSCULAR | Status: DC | PRN
  Administered 2015-10-05: 1500 [IU] via INTRAVENOUS_CENTRAL

## 2015-10-05 MED ORDER — ONDANSETRON HCL 4 MG/2ML IJ SOLN: 4.0000 mg | Freq: Four times a day (QID) | INTRAMUSCULAR | Status: DC | PRN

## 2015-10-05 MED ORDER — PENTAFLUOROPROP-TETRAFLUOROETH EX AERO
1.0000 | INHALATION_SPRAY | CUTANEOUS | Status: DC | PRN
Start: 2015-10-05 — End: 2015-10-05
  Administered 2015-10-05: 1 via TOPICAL

## 2015-10-05 MED ORDER — HYDRALAZINE HCL 10 MG PO TABS
10.0000 mg | ORAL_TABLET | Freq: Three times a day (TID) | ORAL | Status: DC
  Administered 2015-10-05: 10 mg via ORAL
  Filled 2015-10-05: qty 1

## 2015-10-05 MED ORDER — ONDANSETRON HCL 4 MG PO TABS: 4.0000 mg | ORAL_TABLET | Freq: Four times a day (QID) | ORAL | Status: DC | PRN

## 2015-10-05 MED ORDER — HEPARIN SODIUM (PORCINE) 1000 UNIT/ML IJ SOLN
INTRAMUSCULAR | Status: AC
  Administered 2015-10-05: 1500 [IU] via INTRAVENOUS_CENTRAL
  Filled 2015-10-05: qty 5

## 2015-10-05 MED ORDER — LIDOCAINE-PRILOCAINE 2.5-2.5 % EX CREA
1.0000 | TOPICAL_CREAM | CUTANEOUS | Status: DC | PRN
Start: 2015-10-05 — End: 2015-10-05

## 2015-10-05 MED ORDER — DIPHENHYDRAMINE HCL 25 MG PO CAPS
ORAL_CAPSULE | ORAL | Status: AC
  Filled 2015-10-05: qty 1

## 2015-10-05 MED ORDER — METOPROLOL TARTRATE 50 MG PO TABS
50.0000 mg | ORAL_TABLET | Freq: Two times a day (BID) | ORAL | Status: DC
  Administered 2015-10-05: 50 mg via ORAL
  Filled 2015-10-05: qty 1

## 2015-10-05 MED ORDER — LIDOCAINE HCL (PF) 1 % IJ SOLN
5.0000 mL | INTRAMUSCULAR | Status: DC | PRN
  Administered 2015-10-05: 0.5 mL via INTRADERMAL

## 2015-10-05 MED ORDER — EPOETIN ALFA 10000 UNIT/ML IJ SOLN
10000.0000 [IU] | Freq: Once | INTRAMUSCULAR | Status: AC
  Administered 2015-10-05: 10000 [IU] via SUBCUTANEOUS
  Filled 2015-10-05: qty 1

## 2015-10-05 MED ORDER — EPOETIN ALFA 10000 UNIT/ML IJ SOLN
INTRAMUSCULAR | Status: AC
  Administered 2015-10-05: 10000 [IU] via SUBCUTANEOUS
  Filled 2015-10-05: qty 1
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
## 2015-10-05 NOTE — Procedures (Signed)
   HEMODIALYSIS TREATMENT NOTE:  4.25 hour low heparin dialysis completed via right upper arm AVF (15g ante/retrograde). Goal met: 4 liters removed. Pt elected to end HD session early/AMA with 20 minutes remaining.  All blood was returned and hemostasis was achieved within 10 minutes. Post HD BP 170/104, metoprolol and hydralazine to be administered by primary RN. Report called to Charlynn Court, RN.  Rockwell [place], RN, CDN

## 2015-10-05 NOTE — Discharge Planning (Signed)
Pt DC papers given, explained and educated.  BP meds and lasix given after dialysis.  RN assessment and VS revealed stability for DC to home.  Pt also given dinner prior to DC.  When ready, pt will be walked to front and will be riding the bus home.
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
## 2015-10-05 NOTE — Plan of Care (Signed)
Patient refusing IV placement.

## 2015-10-05 NOTE — ED Notes (Signed)
Pt comes in for dialysis. Denies any other symptoms except for generalized pain. States she hasn't taken her 20 mg predisone today.

## 2015-10-05 NOTE — ED Notes (Signed)
Here for dialysis 

## 2015-10-06 NOTE — Discharge Summary (Addendum)
Physician Discharge Summary  Sara [name] [name] DOB: [birth year] DOA: 10/05/2015  PCP: No primary care provider on file.  Admit date: 10/05/2015 Discharge date: 10/05/2015  Admitted From: Home Disposition:  Home  Recommendations for Outpatient Follow-up:  1. Follow up with PCP in 1-2 weeks   Home Health:No Equipment/Devices:  Discharge Condition:Stable CODE STATUS:Full code Diet recommendation: Heart healthy  Brief/Interim Summary: This patient came to the hospital for dialysis. She has end-stage renal disease and comes to the hospital 3 times a week to undergo dialysis. She has a history of noncompliance and does not regularly show up for dialysis sessions. She complained of some shortness of breath and some lower extremity edema. She was seen by nephrology and underwent dialysis with removal of approximately 4 L of fluid. After dialysis, she was feeling better. Shortness of breath had improved. She was requesting discharge home. The remainder of her medical issues remained stable.  Discharge Diagnoses:  Active Problems:   Essential hypertension   Volume overload   ESRD (end stage renal disease) on dialysis [facility])   Renal failure    Discharge Instructions     Medication List    TAKE these medications        acetaminophen 500 MG tablet  Commonly known as:  TYLENOL  Take 1,000 mg by mouth every 6 (six) hours as needed for moderate pain. Reported on 06/12/2015     furosemide 40 MG tablet  Commonly known as:  LASIX  Take 3 tablets (120 mg total) by mouth daily.     hydrALAZINE 10 MG tablet  Commonly known as:  APRESOLINE  Take 1 tablet (10 mg total) by mouth 3 (three) times daily.     metoprolol 50 MG tablet  Commonly known as:  LOPRESSOR  Take 1 tablet (50 mg total) by mouth 2 (two) times daily.     predniSONE 20 MG tablet  Commonly known as:  DELTASONE  Take 1 tablet (20 mg total) by mouth daily with breakfast.     VITAMIN C PO  Take 1 tablet by  mouth daily.        Allergies  Allergen Reactions  . Ativan [Lorazepam] Swelling and Other (See Comments)    Dysarthria(patient has difficulty speaking and slurred speech); denies swelling, itching, pain, or numbness.  [name] [Ziprasidone Hcl] Itching and Swelling    Tongue swelling  . Keflex [Cephalexin] Swelling and Other (See Comments)    Tongue swelling. Can't talk   . Haldol [Haloperidol Lactate] Swelling    Tongue swelling. 05/31/15 - MD ok with giving as pt has tolerated in the past Pt can take benadryl.  . Other Itching    wool    Consultations: Nephrology Procedures/Studies: Dg Chest 2 View  09/28/2015  CLINICAL DATA:  Sob today/smoker/htn/ EXAM: CHEST  2 VIEW COMPARISON:  08/22/2015 FINDINGS: Moderate cardiac enlargement with mild to moderate vascular congestion. This is similar to the prior study. No definite pulmonary edema, but minimal blunting of right costophrenic angle is noted which could indicate chronic thickening or tiny effusion. If this finding is unchanged there is progressive osseous hypertrophy with erosive change of both acromioclavicular joints. IMPRESSION: Cardiac enlargement and vascular congestion without definite pulmonary edema. Progressive acromioclavicular joint abnormality bilaterally with differential diagnosis including hyperparathyroidism, rheumatoid arthritis, scleroderma, and bilateral trauma. Electronically Signed   By: Skipper Cliche M.D.   On: 09/28/2015 10:24       Subjective: Patient is feeling better after dialysis. Wants to go home.  Discharge Exam:  Filed Vitals:   10/05/15 1730 10/05/15 1740  BP: 159/103 172/104  Pulse: 108 104  Temp:    Resp:  18   Filed Vitals:   10/05/15 1630 10/05/15 1700 10/05/15 1730 10/05/15 1740  BP: 169/103 158/101 159/103 172/104  Pulse: 108 109 108 104  Temp:      TempSrc:      Resp:    18  Height:      Weight:      SpO2:        General: Pt is alert, awake, not in acute  distress Cardiovascular: RRR, S1/S2 +, no rubs, no gallops Respiratory: CTA bilaterally, no wheezing, no rhonchi Abdominal: Soft, NT, ND, bowel sounds + Extremities: 1+ edema, no cyanosis    The results of significant diagnostics from this hospitalization (including imaging, microbiology, ancillary and laboratory) are listed below for reference.     Microbiology: No results found for this or any previous visit (from the past 240 hour(s)).   Labs: BNP (last 3 results) No results for input(s): BNP in the last 8760 hours. Basic Metabolic Panel:  Recent Labs Lab 10/01/15 0826 10/05/15 0938 10/05/15 1358  NA 136  136 136 134*  K 4.2  4.1 4.2 4.7  CL 97*  98* 98* 97*  CO2 24  --  23  GLUCOSE 99  97 107* 130*  BUN 64*  57* 82* 94*  CREATININE 10.85*  10.40* 13.40* 13.52*  CALCIUM 9.1  --  8.8*  PHOS 8.8*  --  10.9*   Liver Function Tests:  Recent Labs Lab 10/01/15 0826 10/05/15 1358  ALBUMIN 3.1* 2.9*   No results for input(s): LIPASE, AMYLASE in the last 168 hours. No results for input(s): AMMONIA in the last 168 hours. CBC:  Recent Labs Lab 10/01/15 0826 10/05/15 0938 10/05/15 1358  WBC 6.8  --  9.4  HGB 8.0*  9.2* 10.2* 8.0*  HCT 24.8*  27.0* 30.0* 24.8*  MCV 90.8  --  87.3  PLT 192  --  178   Cardiac Enzymes: No results for input(s): CKTOTAL, CKMB, CKMBINDEX, TROPONINI in the last 168 hours. BNP: Invalid input(s): POCBNP CBG: No results for input(s): GLUCAP in the last 168 hours. D-Dimer No results for input(s): DDIMER in the last 72 hours. Hgb A1c No results for input(s): HGBA1C in the last 72 hours. Lipid Profile No results for input(s): CHOL, HDL, LDLCALC, TRIG, CHOLHDL, LDLDIRECT in the last 72 hours. Thyroid function studies No results for input(s): TSH, T4TOTAL, T3FREE, THYROIDAB in the last 72 hours.  Invalid input(s): FREET3 Anemia work up No results for input(s): VITAMINB12, FOLATE, FERRITIN, TIBC, IRON, RETICCTPCT in the last 72  hours. Urinalysis    Component Value Date/Time   COLORURINE YELLOW 08/11/2015 1235   APPEARANCEUR HAZY* 08/11/2015 1235   LABSPEC 1.015 08/11/2015 1235   PHURINE 7.5 08/11/2015 1235   GLUCOSEU 100* 08/11/2015 1235   HGBUR SMALL* 08/11/2015 1235   BILIRUBINUR NEGATIVE 08/11/2015 1235   KETONESUR NEGATIVE 08/11/2015 1235   PROTEINUR >300* 08/11/2015 1235   UROBILINOGEN 0.2 01/09/2015 1645   NITRITE NEGATIVE 08/11/2015 1235   LEUKOCYTESUR SMALL* 08/11/2015 1235   Sepsis Labs Invalid input(s): PROCALCITONIN,  WBC,  LACTICIDVEN Microbiology No results found for this or any previous visit (from the past 240 hour(s)).   Time spent: greater than 31mins  SIGNED:   Kathie Dike, MD  [facility] 10/06/2015, 6:26 PM Pager   If 7PM-7AM, please contact night-coverage [email or web address] Password [name]

## 2015-10-08 ENCOUNTER — Emergency Department (HOSPITAL_COMMUNITY)
Admission: EM | Admit: 2015-10-08 | Discharge: 2015-10-08 | Disposition: A | Discharge status: Home / Self Care | Payer: Medicare Other | Attending: Emergency Medicine | Admitting: Emergency Medicine

## 2015-10-08 ENCOUNTER — Encounter (HOSPITAL_COMMUNITY): Payer: Self-pay | Admitting: *Deleted

## 2015-10-08 DIAGNOSIS — I132 Hypertensive heart and chronic kidney disease with heart failure and with stage 5 chronic kidney disease, or end stage renal disease: Secondary | ICD-10-CM | POA: Diagnosis not present

## 2015-10-08 DIAGNOSIS — N186 End stage renal disease: Secondary | ICD-10-CM | POA: Insufficient documentation

## 2015-10-08 DIAGNOSIS — I509 Heart failure, unspecified: Secondary | ICD-10-CM | POA: Diagnosis not present

## 2015-10-08 DIAGNOSIS — Z79899 Other long term (current) drug therapy: Secondary | ICD-10-CM | POA: Diagnosis not present

## 2015-10-08 DIAGNOSIS — F319 Bipolar disorder, unspecified: Secondary | ICD-10-CM | POA: Insufficient documentation

## 2015-10-08 DIAGNOSIS — F17210 Nicotine dependence, cigarettes, uncomplicated: Secondary | ICD-10-CM | POA: Insufficient documentation

## 2015-10-08 DIAGNOSIS — I120 Hypertensive chronic kidney disease with stage 5 chronic kidney disease or end stage renal disease: Secondary | ICD-10-CM | POA: Diagnosis not present

## 2015-10-08 DIAGNOSIS — Z992 Dependence on renal dialysis: Secondary | ICD-10-CM | POA: Diagnosis not present

## 2015-10-08 DIAGNOSIS — R609 Edema, unspecified: Secondary | ICD-10-CM

## 2015-10-08 DIAGNOSIS — R600 Localized edema: Secondary | ICD-10-CM | POA: Diagnosis not present

## 2015-10-08 DIAGNOSIS — F209 Schizophrenia, unspecified: Secondary | ICD-10-CM | POA: Insufficient documentation

## 2015-10-08 LAB — I-STAT CHEM 8, ED
BUN: 71 mg/dL — AB (ref 6–20)
CREATININE: 11.7 mg/dL — AB (ref 0.44–1.00)
Calcium, Ion: 1.13 mmol/L (ref 1.13–1.30)
Chloride: 97 mmol/L — ABNORMAL LOW (ref 101–111)
Glucose, Bld: 113 mg/dL — ABNORMAL HIGH (ref 65–99)
HEMATOCRIT: 29 % — AB (ref 36.0–46.0)
Hemoglobin: 9.9 g/dL — ABNORMAL LOW (ref 12.0–15.0)
POTASSIUM: 4.6 mmol/L (ref 3.5–5.1)
SODIUM: 134 mmol/L — AB (ref 135–145)
TCO2: 25 mmol/L (ref 0–100)

## 2015-10-08 NOTE — ED Notes (Signed)
Pt made aware to return if symptoms worsen or if any life threatening symptoms occur.   

## 2015-10-08 NOTE — ED Notes (Signed)
Pt comes in for dialysis. Denies any specific pain. Last had dialysis on Friday.

## 2015-10-08 NOTE — Discharge Instructions (Signed)
If you were given medicines take as directed.  If you are on coumadin or contraceptives realize their levels and effectiveness is altered by many different medicines.  If you have any reaction (rash, tongues swelling, other) to the medicines stop taking and see a physician.    If your blood pressure was elevated in the ER make sure you follow up for management with a primary doctor or return for chest pain, shortness of breath or stroke symptoms.  Please follow up as directed and return to the ER or see a physician for new or worsening symptoms.  Thank you. Filed Vitals:   10/08/15 0804  BP: 173/116  Pulse: 55  Temp: 97.6 F (36.4 C)  TempSrc: Oral  Resp: 16  Height: 5\' 7"  (1.702 m)  Weight: 202 lb 8 oz (91.853 kg)  SpO2: 97%

## 2015-10-10 ENCOUNTER — Encounter (HOSPITAL_COMMUNITY): Payer: Self-pay | Admitting: Emergency Medicine

## 2015-10-10 ENCOUNTER — Observation Stay (HOSPITAL_COMMUNITY)
Admission: EM | Admit: 2015-10-10 | Discharge: 2015-10-10 | Disposition: A | Discharge status: Home / Self Care | Payer: Medicare Other | Attending: Family Medicine | Admitting: Family Medicine

## 2015-10-10 DIAGNOSIS — M329 Systemic lupus erythematosus, unspecified: Secondary | ICD-10-CM | POA: Diagnosis present

## 2015-10-10 DIAGNOSIS — I132 Hypertensive heart and chronic kidney disease with heart failure and with stage 5 chronic kidney disease, or end stage renal disease: Secondary | ICD-10-CM | POA: Diagnosis not present

## 2015-10-10 DIAGNOSIS — I509 Heart failure, unspecified: Secondary | ICD-10-CM | POA: Diagnosis not present

## 2015-10-10 DIAGNOSIS — R140 Abdominal distension (gaseous): Secondary | ICD-10-CM | POA: Diagnosis not present

## 2015-10-10 DIAGNOSIS — F17210 Nicotine dependence, cigarettes, uncomplicated: Secondary | ICD-10-CM | POA: Insufficient documentation

## 2015-10-10 DIAGNOSIS — F250 Schizoaffective disorder, bipolar type: Secondary | ICD-10-CM | POA: Insufficient documentation

## 2015-10-10 DIAGNOSIS — Z79899 Other long term (current) drug therapy: Secondary | ICD-10-CM | POA: Insufficient documentation

## 2015-10-10 DIAGNOSIS — E8770 Fluid overload, unspecified: Secondary | ICD-10-CM | POA: Diagnosis not present

## 2015-10-10 DIAGNOSIS — R0602 Shortness of breath: Secondary | ICD-10-CM | POA: Insufficient documentation

## 2015-10-10 DIAGNOSIS — I120 Hypertensive chronic kidney disease with stage 5 chronic kidney disease or end stage renal disease: Secondary | ICD-10-CM | POA: Diagnosis not present

## 2015-10-10 DIAGNOSIS — D631 Anemia in chronic kidney disease: Secondary | ICD-10-CM | POA: Diagnosis not present

## 2015-10-10 DIAGNOSIS — E8779 Other fluid overload: Secondary | ICD-10-CM | POA: Diagnosis not present

## 2015-10-10 DIAGNOSIS — Z992 Dependence on renal dialysis: Secondary | ICD-10-CM | POA: Diagnosis not present

## 2015-10-10 DIAGNOSIS — M7989 Other specified soft tissue disorders: Secondary | ICD-10-CM | POA: Diagnosis present

## 2015-10-10 DIAGNOSIS — N186 End stage renal disease: Secondary | ICD-10-CM | POA: Diagnosis not present

## 2015-10-10 DIAGNOSIS — Z8639 Personal history of other endocrine, nutritional and metabolic disease: Secondary | ICD-10-CM

## 2015-10-10 DIAGNOSIS — N250 Renal osteodystrophy: Secondary | ICD-10-CM | POA: Diagnosis not present

## 2015-10-10 LAB — RENAL FUNCTION PANEL
Albumin: 3.1 g/dL — ABNORMAL LOW (ref 3.5–5.0)
Anion gap: 17 — ABNORMAL HIGH (ref 5–15)
CHLORIDE: 95 mmol/L — AB (ref 101–111)
CO2: 21 mmol/L — AB (ref 22–32)
CREATININE: 13.69 mg/dL — AB (ref 0.44–1.00)
Calcium: 9.2 mg/dL (ref 8.9–10.3)
GFR calc Af Amer: 4 mL/min — ABNORMAL LOW (ref >60)
GFR, EST NON AFRICAN AMERICAN: 3 mL/min — AB (ref >60)
GLUCOSE: 145 mg/dL — AB (ref 65–99)
Phosphorus: 11.7 mg/dL — ABNORMAL HIGH (ref 2.5–4.6)
Potassium: 4.7 mmol/L (ref 3.5–5.1)
Sodium: 133 mmol/L — ABNORMAL LOW (ref 135–145)

## 2015-10-10 LAB — CBC
HCT: 24.3 % — ABNORMAL LOW (ref 36.0–46.0)
Hemoglobin: 8 g/dL — ABNORMAL LOW (ref 12.0–15.0)
MCH: 28.5 pg (ref 26.0–34.0)
MCHC: 32.9 g/dL (ref 30.0–36.0)
MCV: 86.5 fL (ref 78.0–100.0)
PLATELETS: 207 10*3/uL (ref 150–400)
RBC: 2.81 MIL/uL — ABNORMAL LOW (ref 3.87–5.11)
RDW: 17.1 % — AB (ref 11.5–15.5)
WBC: 8.1 10*3/uL (ref 4.0–10.5)

## 2015-10-10 LAB — I-STAT CHEM 8, ED
BUN: 91 mg/dL — AB (ref 6–20)
CALCIUM ION: 1.14 mmol/L (ref 1.13–1.30)
CHLORIDE: 98 mmol/L — AB (ref 101–111)
CREATININE: 13.5 mg/dL — AB (ref 0.44–1.00)
Glucose, Bld: 99 mg/dL (ref 65–99)
HEMATOCRIT: 28 % — AB (ref 36.0–46.0)
Hemoglobin: 9.5 g/dL — ABNORMAL LOW (ref 12.0–15.0)
Potassium: 4.8 mmol/L (ref 3.5–5.1)
SODIUM: 133 mmol/L — AB (ref 135–145)
TCO2: 23 mmol/L (ref 0–100)

## 2015-10-10 MED ORDER — PREDNISONE 20 MG PO TABS
20.0000 mg | ORAL_TABLET | Freq: Once | ORAL | Status: AC
  Administered 2015-10-10: 20 mg via ORAL
  Filled 2015-10-10: qty 1

## 2015-10-10 MED ORDER — HEPARIN SODIUM (PORCINE) 1000 UNIT/ML IJ SOLN
INTRAMUSCULAR | Status: AC
  Administered 2015-10-10: 1500 [IU] via INTRAVENOUS_CENTRAL
  Filled 2015-10-10: qty 5

## 2015-10-10 MED ORDER — DIPHENHYDRAMINE HCL 25 MG PO CAPS: 25.0000 mg | ORAL_CAPSULE | Freq: Four times a day (QID) | ORAL | Status: DC | PRN

## 2015-10-10 MED ORDER — EPOETIN ALFA 10000 UNIT/ML IJ SOLN
10000.0000 [IU] | INTRAMUSCULAR | Status: DC
  Administered 2015-10-10: 10000 [IU] via SUBCUTANEOUS

## 2015-10-10 MED ORDER — SODIUM CHLORIDE 0.9 % IV SOLN: 100.0000 mL | INTRAVENOUS | Status: DC | PRN

## 2015-10-10 MED ORDER — LIDOCAINE HCL (PF) 1 % IJ SOLN
INTRAMUSCULAR | Status: AC
  Administered 2015-10-10: 0.5 mL via INTRADERMAL
  Filled 2015-10-10: qty 5

## 2015-10-10 MED ORDER — LIDOCAINE HCL (PF) 1 % IJ SOLN
5.0000 mL | INTRAMUSCULAR | Status: DC | PRN
  Administered 2015-10-10: 0.5 mL via INTRADERMAL

## 2015-10-10 MED ORDER — PREDNISONE 20 MG PO TABS: 20.0000 mg | ORAL_TABLET | Freq: Every day | ORAL | Status: DC

## 2015-10-10 MED ORDER — FUROSEMIDE 80 MG PO TABS
120.0000 mg | ORAL_TABLET | Freq: Every day | ORAL | Status: DC
  Administered 2015-10-10: 120 mg via ORAL
  Filled 2015-10-10: qty 1

## 2015-10-10 MED ORDER — ACETAMINOPHEN 325 MG PO TABS
650.0000 mg | ORAL_TABLET | Freq: Four times a day (QID) | ORAL | Status: DC | PRN
Start: 2015-10-10 — End: 2015-10-10

## 2015-10-10 MED ORDER — EPOETIN ALFA 10000 UNIT/ML IJ SOLN
INTRAMUSCULAR | Status: AC
  Administered 2015-10-10: 10000 [IU] via SUBCUTANEOUS
  Filled 2015-10-10: qty 1

## 2015-10-10 MED ORDER — METOPROLOL TARTRATE 50 MG PO TABS
50.0000 mg | ORAL_TABLET | Freq: Two times a day (BID) | ORAL | Status: DC
  Administered 2015-10-10: 50 mg via ORAL
  Filled 2015-10-10: qty 1

## 2015-10-10 MED ORDER — ACETAMINOPHEN 325 MG PO TABS
ORAL_TABLET | ORAL | Status: AC
  Filled 2015-10-10: qty 2

## 2015-10-10 MED ORDER — PENTAFLUOROPROP-TETRAFLUOROETH EX AERO: 1.0000 "application " | INHALATION_SPRAY | CUTANEOUS | Status: DC | PRN

## 2015-10-10 MED ORDER — LIDOCAINE-PRILOCAINE 2.5-2.5 % EX CREA: 1.0000 "application " | TOPICAL_CREAM | CUTANEOUS | Status: DC | PRN

## 2015-10-10 MED ORDER — ACETAMINOPHEN 650 MG RE SUPP: 650.0000 mg | Freq: Four times a day (QID) | RECTAL | Status: DC | PRN

## 2015-10-10 MED ORDER — DIPHENHYDRAMINE HCL 25 MG PO CAPS
ORAL_CAPSULE | ORAL | Status: AC
  Filled 2015-10-10: qty 1

## 2015-10-10 MED ORDER — SEVELAMER CARBONATE 800 MG PO TABS: 1600.0000 mg | ORAL_TABLET | Freq: Three times a day (TID) | ORAL | Status: DC

## 2015-10-10 MED ORDER — HEPARIN SODIUM (PORCINE) 1000 UNIT/ML DIALYSIS
20.0000 [IU]/kg | INTRAMUSCULAR | Status: DC | PRN
  Administered 2015-10-10: 1500 [IU] via INTRAVENOUS_CENTRAL
  Filled 2015-10-10 (×2): qty 2

## 2015-10-10 MED ORDER — SEVELAMER CARBONATE 800 MG PO TABS
3200.0000 mg | ORAL_TABLET | Freq: Three times a day (TID) | ORAL | Status: DC
  Administered 2015-10-10 (×2): 3200 mg via ORAL
  Filled 2015-10-10 (×2): qty 4

## 2015-10-10 NOTE — ED Notes (Signed)
Report given to Janett Billow, RN for room 308.

## 2015-10-10 NOTE — Procedures (Signed)
   HEMODIALYSIS TREATMENT NOTE:  4.5 hour low-heparin dialysis completed via right upper arm aVF (15g ante/retrograde). Goal met: 4.1 liters removed without interruption in ultrafiltration. All blood was returned and hemostasis was achieved within 15 minutes. Report given to Sharyn Blitz, RN.  Rockwell Alexandria, RN, CDN
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
## 2015-10-10 NOTE — Progress Notes (Signed)
Pt left unit AMA.  Dr. Sarajane Jews paged and made aware.  Pt ambulated off floor.

## 2015-10-10 NOTE — Progress Notes (Signed)
Subjective: Interval History: has complaints difficulty in breathing Denies any nausea or vomiting..  Objective: Vital signs in last 24 hours: Temp:  [97.7 F (36.5 C)-98 F (36.7 C)] 98 F (36.7 C) (07/05 1028) Pulse Rate:  [100-121] 121 (07/05 1028) Resp:  [20-24] 20 (07/05 1000) BP: (157-168)/(112-116) 157/114 mmHg (07/05 1028) SpO2:  [99 %] 99 % (07/05 1000) Weight:  [72.576 kg (160 lb)-93.441 kg (206 lb)] 93.441 kg (206 lb) (07/05 1028) Weight change:   Intake/Output from previous day:   Intake/Output this shift:    General appearance: alert, cooperative and no distress Resp: diminished breath sounds bilaterally Cardio: regular rate and rhythm GI: soft, non-tender; bowel sounds normal; no masses,  no organomegaly Extremities: edema 2+ edema  Lab Results:  Recent Labs  10/08/15 0842 10/10/15 0837  HGB 9.9* 9.5*  HCT 29.0* 28.0*   BMET:  Recent Labs  10/08/15 0842 10/10/15 0837  NA 134* 133*  K 4.6 4.8  CL 97* 98*  GLUCOSE 113* 99  BUN 71* 91*  CREATININE 11.70* 13.50*   No results for input(s): PTH in the last 72 hours. Iron Studies: No results for input(s): IRON, TIBC, TRANSFERRIN, FERRITIN in the last 72 hours.  Studies/Results: No results found.  I have reviewed the patient's current medications.  Assessment/Plan:   Problem #1 fluid overload: Secondary to uncontrolled fluid and salt intakenplus lack of dialysis. This is a recurrent problem. Problem #2  end-stage renal disease : Presently are pending creatinine is high but she  Does not have any nausea vomiting. Potassium is good. Problem #3history of lupus Problem #4 anemia: Patient on Epogen her hemoglobin remains stable Problem #5 metabolic bone disease: Patient is still not taking her binders  Even though she  was put on Renvela   Problem #6history of schizophrenia Plan: We'll dialyze her for 4-1/2 hours We'll remove about 4 L if systolic blood pressure remains stable  we'll put her back on  her malar 800 mg 4 tablets by mouth 3 times a day with meals and 2 with snack Patient advised to take her medication 4] we'll be waiting for dialysis unit in [place] to talk with the patient and make arrangement for her to get dialysis drowsier as we discussed last week with an ulceration.     Sara [name] S 10/10/2015,11:02 AM

## 2015-10-10 NOTE — ED Notes (Signed)
Patient states she needs dialysis. States last dialysis was Friday. Complaining of swelling to bilateral lower extremities.
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
## 2015-10-11 LAB — HEPATITIS B SURFACE ANTIGEN: Hepatitis B Surface Ag: NEGATIVE

## 2015-10-11 NOTE — Discharge Summary (Addendum)
  Physician Discharge Summary  Sara [name] [id number] DOB: [birth year] DOA: 10/10/2015  PCP: No primary care provider on file.  Admit date: 10/10/2015 Discharge date: 10/10/2015   Patient left AGAINST MEDICAL ADVICE    Discharge Diagnoses:  1. End-stage renal disease with volume overload secondary to noncompliance 2. Lupus 3. Anemia of end-stage renal disease  Discharge Condition: improved Disposition: left AMA    Filed Weights   10/10/15 0818 10/10/15 1028 10/10/15 1230  Weight: 72.576 kg (160 lb) 93.441 kg (206 lb) 93.5 kg (206 lb 2.1 oz)    History of present illness:  35 year old woman with end-stage renal disease on hemodialysis, noncompliant who has no outpatient dialysis facility because of behavioral issues presented with increasing lower extremity edema and volume overload. Recommendation for hemodialysis per nephrology, hospital service asked to admit.  Hospital Course:  Patient was seen by nephrology and underwent hemodialysis without complication she subsequently left AMA thereafter.     Allergies  Allergen Reactions  . Ativan [Lorazepam] Swelling and Other (See Comments)    Dysarthria(patient has difficulty speaking and slurred speech); denies swelling, itching, pain, or numbness.  [name] [Ziprasidone Hcl] Itching and Swelling    Tongue swelling  . Keflex [Cephalexin] Swelling and Other (See Comments)    Tongue swelling. Can't talk   . Haldol [Haloperidol Lactate] Swelling    Tongue swelling. 05/31/15 - MD ok with giving as pt has tolerated in the past Pt can take benadryl.  . Other Itching    wool    The results of significant diagnostics from this hospitalization (including imaging, microbiology, ancillary and laboratory) are listed below for reference.    Labs: Basic Metabolic Panel:  Recent Labs Lab 10/05/15 0938 10/05/15 1358 10/08/15 0842 10/10/15 0837 10/10/15 1315  NA 136 134* 134* 133* 133*  K 4.2 4.7 4.6 4.8 4.7  CL 98* 97* 97*  98* 95*  CO2  --  23  --   --  21*  GLUCOSE 107* 130* 113* 99 145*  BUN 82* 94* 71* 91* >100*  CREATININE 13.40* 13.52* 11.70* 13.50* 13.69*  CALCIUM  --  8.8*  --   --  9.2  PHOS  --  10.9*  --   --  11.7*   Liver Function Tests:  Recent Labs Lab 10/05/15 1358 10/10/15 1315  ALBUMIN 2.9* 3.1*   CBC:  Recent Labs Lab 10/05/15 0938 10/05/15 1358 10/08/15 0842 10/10/15 0837 10/10/15 1315  WBC  --  9.4  --   --  8.1  HGB 10.2* 8.0* 9.9* 9.5* 8.0*  HCT 30.0* 24.8* 29.0* 28.0* 24.3*  MCV  --  87.3  --   --  86.5  PLT  --  178  --   --  207    Principal Problem:   Volume overload Active Problems:   Admission for dialysis ([name])   Anemia in end-stage renal disease (HCC)   ESRD (end stage renal disease) on dialysis ([place])   Encounter for hemodialysis for end-stage renal disease ([place])   Lupus (systemic lupus erythematosus) ([place])   Time coordinating discharge: 15 minutes  Signed:  Murray Hodgkins, MD [facility] 10/11/2015, 4:17 PM

## 2015-10-12 ENCOUNTER — Encounter (HOSPITAL_COMMUNITY): Payer: Self-pay | Admitting: Emergency Medicine

## 2015-10-12 ENCOUNTER — Emergency Department (HOSPITAL_COMMUNITY)
Admission: EM | Admit: 2015-10-12 | Discharge: 2015-10-12 | Disposition: A | Discharge status: Home / Self Care | Payer: Medicare Other | Attending: Emergency Medicine | Admitting: Emergency Medicine

## 2015-10-12 DIAGNOSIS — I509 Heart failure, unspecified: Secondary | ICD-10-CM | POA: Insufficient documentation

## 2015-10-12 DIAGNOSIS — Z992 Dependence on renal dialysis: Secondary | ICD-10-CM | POA: Diagnosis not present

## 2015-10-12 DIAGNOSIS — F250 Schizoaffective disorder, bipolar type: Secondary | ICD-10-CM | POA: Insufficient documentation

## 2015-10-12 DIAGNOSIS — F17210 Nicotine dependence, cigarettes, uncomplicated: Secondary | ICD-10-CM | POA: Insufficient documentation

## 2015-10-12 DIAGNOSIS — Z79899 Other long term (current) drug therapy: Secondary | ICD-10-CM | POA: Diagnosis not present

## 2015-10-12 DIAGNOSIS — N186 End stage renal disease: Secondary | ICD-10-CM

## 2015-10-12 DIAGNOSIS — I132 Hypertensive heart and chronic kidney disease with heart failure and with stage 5 chronic kidney disease, or end stage renal disease: Secondary | ICD-10-CM | POA: Insufficient documentation

## 2015-10-12 LAB — I-STAT CHEM 8, ED
BUN: 57 mg/dL — AB (ref 6–20)
CREATININE: 10.1 mg/dL — AB (ref 0.44–1.00)
Calcium, Ion: 1.13 mmol/L (ref 1.13–1.30)
Chloride: 99 mmol/L — ABNORMAL LOW (ref 101–111)
Glucose, Bld: 122 mg/dL — ABNORMAL HIGH (ref 65–99)
HEMATOCRIT: 31 % — AB (ref 36.0–46.0)
Hemoglobin: 10.5 g/dL — ABNORMAL LOW (ref 12.0–15.0)
Potassium: 4.2 mmol/L (ref 3.5–5.1)
Sodium: 136 mmol/L (ref 135–145)
TCO2: 26 mmol/L (ref 0–100)

## 2015-10-12 NOTE — ED Notes (Signed)
Out to discharge pt. Pt not in waiting room.

## 2015-10-12 NOTE — Discharge Instructions (Signed)
Chronic Kidney Disease Chronic kidney disease happens when the kidneys are damaged over a long period. The kidneys are two organs that do many important jobs in the body. These jobs include:  Removing wastes and extra fluids from the blood.  Making hormones that help to keep the body healthy.  Making sure that the body has the right amount of fluids and chemicals. Chronic kidney disease may be caused by many things. The kidney damage occurs slowly. If too much damage occurs, the kidneys may stop working the way that they should. This is dangerous. Treatment can help to slow down the damage and keep it from getting worse. HOME CARE  Follow your diet as told by your doctor. You may need to limit the amount of salt (sodium) and protein that you eat each day.  Take medicines only as told by your doctor. Do not take any new medicines unless your doctor approves it.  Quit smoking if you smoke. Talk to your doctor about programs that may help you quit smoking.  Have your blood pressure checked regularly and keep track of the results.  Start or keep doing an exercise plan.  Get shots (immunizations) as told by your doctor.  Take vitamins and minerals as told by your doctor.  Keep all follow-up visits as told by your doctor. This is important. GET HELP RIGHT AWAY IF:   Your symptoms get worse.  You have new symptoms.  You have symptoms of end-stage kidney disease. These include:  Headaches.  Skin that is darker or lighter than normal.  Numbness in the hands or feet.  Easy bruising.  Frequent hiccups.  Stopping of menstrual periods in women.  You have a fever.  You are making very little pee (urine).  You have pain or bleeding when you pee.   This information is not intended to replace advice given to you by your health care provider. Make sure you discuss any questions you have with your health care provider.   Document Released: 06/18/2009 Document Revised: 12/13/2014  Document Reviewed: 11/21/2011 Elsevier Interactive Patient Education 2016 Elsevier Inc.  
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
## 2015-10-12 NOTE — ED Notes (Signed)
Pt here to receive dialysis  
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
## 2015-10-15 ENCOUNTER — Encounter (HOSPITAL_COMMUNITY): Payer: Self-pay | Admitting: Emergency Medicine

## 2015-10-15 ENCOUNTER — Observation Stay (HOSPITAL_COMMUNITY)
Admission: EM | Admit: 2015-10-15 | Discharge: 2015-10-15 | Disposition: A | Discharge status: Home / Self Care | Payer: Medicare Other | Attending: Family Medicine | Admitting: Family Medicine

## 2015-10-15 DIAGNOSIS — Z79899 Other long term (current) drug therapy: Secondary | ICD-10-CM | POA: Diagnosis not present

## 2015-10-15 DIAGNOSIS — F17210 Nicotine dependence, cigarettes, uncomplicated: Secondary | ICD-10-CM | POA: Insufficient documentation

## 2015-10-15 DIAGNOSIS — E8770 Fluid overload, unspecified: Secondary | ICD-10-CM | POA: Diagnosis not present

## 2015-10-15 DIAGNOSIS — N186 End stage renal disease: Secondary | ICD-10-CM | POA: Insufficient documentation

## 2015-10-15 DIAGNOSIS — Z452 Encounter for adjustment and management of vascular access device: Secondary | ICD-10-CM | POA: Diagnosis present

## 2015-10-15 DIAGNOSIS — Z4902 Encounter for fitting and adjustment of peritoneal dialysis catheter: Secondary | ICD-10-CM | POA: Diagnosis not present

## 2015-10-15 DIAGNOSIS — I509 Heart failure, unspecified: Secondary | ICD-10-CM | POA: Diagnosis not present

## 2015-10-15 DIAGNOSIS — R0602 Shortness of breath: Secondary | ICD-10-CM | POA: Diagnosis not present

## 2015-10-15 DIAGNOSIS — F209 Schizophrenia, unspecified: Secondary | ICD-10-CM | POA: Insufficient documentation

## 2015-10-15 DIAGNOSIS — I132 Hypertensive heart and chronic kidney disease with heart failure and with stage 5 chronic kidney disease, or end stage renal disease: Secondary | ICD-10-CM | POA: Diagnosis not present

## 2015-10-15 DIAGNOSIS — E8779 Other fluid overload: Secondary | ICD-10-CM | POA: Diagnosis not present

## 2015-10-15 DIAGNOSIS — Z4931 Encounter for adequacy testing for hemodialysis: Secondary | ICD-10-CM | POA: Insufficient documentation

## 2015-10-15 DIAGNOSIS — Z992 Dependence on renal dialysis: Secondary | ICD-10-CM | POA: Diagnosis not present

## 2015-10-15 LAB — RENAL FUNCTION PANEL
ANION GAP: 15 (ref 5–15)
Albumin: 3.1 g/dL — ABNORMAL LOW (ref 3.5–5.0)
BUN: 96 mg/dL — AB (ref 6–20)
CHLORIDE: 99 mmol/L — AB (ref 101–111)
CO2: 22 mmol/L (ref 22–32)
Calcium: 9.1 mg/dL (ref 8.9–10.3)
Creatinine, Ser: 13.66 mg/dL — ABNORMAL HIGH (ref 0.44–1.00)
GFR calc Af Amer: 4 mL/min — ABNORMAL LOW (ref >60)
GFR, EST NON AFRICAN AMERICAN: 3 mL/min — AB (ref >60)
Glucose, Bld: 139 mg/dL — ABNORMAL HIGH (ref 65–99)
PHOSPHORUS: 11 mg/dL — AB (ref 2.5–4.6)
POTASSIUM: 5.3 mmol/L — AB (ref 3.5–5.1)
Sodium: 136 mmol/L (ref 135–145)

## 2015-10-15 LAB — I-STAT CHEM 8, ED
BUN: 83 mg/dL — AB (ref 6–20)
CHLORIDE: 104 mmol/L (ref 101–111)
CREATININE: 13.6 mg/dL — AB (ref 0.44–1.00)
Calcium, Ion: 1 mmol/L — ABNORMAL LOW (ref 1.13–1.30)
Glucose, Bld: 94 mg/dL (ref 65–99)
HEMATOCRIT: 29 % — AB (ref 36.0–46.0)
Hemoglobin: 9.9 g/dL — ABNORMAL LOW (ref 12.0–15.0)
Potassium: 4.9 mmol/L (ref 3.5–5.1)
Sodium: 136 mmol/L (ref 135–145)
TCO2: 20 mmol/L (ref 0–100)

## 2015-10-15 LAB — CBC
HEMATOCRIT: 23.7 % — AB (ref 36.0–46.0)
HEMOGLOBIN: 7.9 g/dL — AB (ref 12.0–15.0)
MCH: 28.7 pg (ref 26.0–34.0)
MCHC: 33.3 g/dL (ref 30.0–36.0)
MCV: 86.2 fL (ref 78.0–100.0)
Platelets: 220 10*3/uL (ref 150–400)
RBC: 2.75 MIL/uL — AB (ref 3.87–5.11)
RDW: 17.1 % — AB (ref 11.5–15.5)
WBC: 8 10*3/uL (ref 4.0–10.5)

## 2015-10-15 MED ORDER — HEPARIN SODIUM (PORCINE) 1000 UNIT/ML IJ SOLN
INTRAMUSCULAR | Status: AC
  Administered 2015-10-15: 1800 [IU] via INTRAVENOUS_CENTRAL
  Filled 2015-10-15: qty 4

## 2015-10-15 MED ORDER — DIPHENHYDRAMINE HCL 25 MG PO CAPS
25.0000 mg | ORAL_CAPSULE | Freq: Four times a day (QID) | ORAL | Status: DC | PRN
  Administered 2015-10-15: 25 mg via ORAL

## 2015-10-15 MED ORDER — SEVELAMER CARBONATE 800 MG PO TABS
3200.0000 mg | ORAL_TABLET | Freq: Three times a day (TID) | ORAL | Status: DC
  Administered 2015-10-15: 3200 mg via ORAL
  Filled 2015-10-15 (×7): qty 4

## 2015-10-15 MED ORDER — PREDNISONE 20 MG PO TABS: 20.0000 mg | ORAL_TABLET | Freq: Every day | ORAL | Status: DC

## 2015-10-15 MED ORDER — EPOETIN ALFA 10000 UNIT/ML IJ SOLN
INTRAMUSCULAR | Status: AC
  Administered 2015-10-15: 10000 [IU] via INTRAVENOUS
  Filled 2015-10-15: qty 1

## 2015-10-15 MED ORDER — ALTEPLASE 2 MG IJ SOLR
2.0000 mg | Freq: Once | INTRAMUSCULAR | Status: DC | PRN
  Filled 2015-10-15: qty 2

## 2015-10-15 MED ORDER — HEPARIN SODIUM (PORCINE) 1000 UNIT/ML DIALYSIS
1000.0000 [IU] | INTRAMUSCULAR | Status: DC | PRN
  Filled 2015-10-15: qty 1

## 2015-10-15 MED ORDER — HEPARIN SODIUM (PORCINE) 1000 UNIT/ML DIALYSIS
20.0000 [IU]/kg | INTRAMUSCULAR | Status: DC | PRN
  Administered 2015-10-15: 1800 [IU] via INTRAVENOUS_CENTRAL
  Filled 2015-10-15 (×2): qty 2

## 2015-10-15 MED ORDER — FUROSEMIDE 80 MG PO TABS: 120.0000 mg | ORAL_TABLET | Freq: Every day | ORAL | Status: DC

## 2015-10-15 MED ORDER — METOPROLOL TARTRATE 50 MG PO TABS: 50.0000 mg | ORAL_TABLET | Freq: Two times a day (BID) | ORAL | Status: DC

## 2015-10-15 MED ORDER — PENTAFLUOROPROP-TETRAFLUOROETH EX AERO: 1.0000 "application " | INHALATION_SPRAY | CUTANEOUS | Status: DC | PRN

## 2015-10-15 MED ORDER — LIDOCAINE-PRILOCAINE 2.5-2.5 % EX CREA: 1.0000 "application " | TOPICAL_CREAM | CUTANEOUS | Status: DC | PRN

## 2015-10-15 MED ORDER — EPOETIN ALFA 10000 UNIT/ML IJ SOLN
10000.0000 [IU] | Freq: Once | INTRAMUSCULAR | Status: AC
  Administered 2015-10-15: 10000 [IU] via INTRAVENOUS
  Filled 2015-10-15: qty 1

## 2015-10-15 MED ORDER — DIPHENHYDRAMINE HCL 25 MG PO CAPS
ORAL_CAPSULE | ORAL | Status: AC
  Administered 2015-10-15: 25 mg via ORAL
  Filled 2015-10-15: qty 1

## 2015-10-15 MED ORDER — SODIUM CHLORIDE 0.9 % IV SOLN: 100.0000 mL | INTRAVENOUS | Status: DC | PRN

## 2015-10-15 MED ORDER — LIDOCAINE HCL (PF) 1 % IJ SOLN
INTRAMUSCULAR | Status: AC
  Administered 2015-10-15: 0.5 mL via INTRADERMAL
  Filled 2015-10-15: qty 5

## 2015-10-15 MED ORDER — LIDOCAINE HCL (PF) 1 % IJ SOLN
5.0000 mL | INTRAMUSCULAR | Status: DC | PRN
  Administered 2015-10-15: 0.5 mL via INTRADERMAL

## 2015-10-15 NOTE — ED Notes (Signed)
Pt presents for routine dialysis.  Denies complaints other than generalized pain.

## 2015-10-15 NOTE — ED Notes (Signed)
Case Manager, Janett Billow, is attempting to arrange outpt dialysis and get pt connected with PCP. Janett Billow reported would get [employer] in touch with Dr. Hinda Lenis. Jessica given Dr. Rhona Leavens pager number.
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
## 2015-10-15 NOTE — ED Notes (Signed)
Pt given warm blanket and placed in waiting until room available.

## 2015-10-15 NOTE — Procedures (Signed)
   HEMODIALYSIS TREATMENT NOTE:  4.5 hour low-heparin dialysis completed via right upper arm AVF (15g ante/retrograde). Goal met: 4.2 liters removed without interruption in ultrafiltration.  All blood was returned and hemostasis was achieved within 20 minutes. Prolonged bleeding from arterial cannulation site (14 minutes). Report given to Jackelyn Hoehn, RN.  [place], RN, CDN

## 2015-10-15 NOTE — Progress Notes (Signed)
Pt received dialysis, ate dinner and then signed out AMA.
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
## 2015-10-15 NOTE — ED Notes (Signed)
Attempted report, receiving RN not available at this time. Reported would call back for report.

## 2015-10-16 LAB — PTH, INTACT AND CALCIUM
Calcium, Total (PTH): 9.3 mg/dL (ref 8.7–10.2)
PTH: 1530 pg/mL — ABNORMAL HIGH (ref 15–65)

## 2015-10-17 ENCOUNTER — Encounter (HOSPITAL_COMMUNITY): Payer: Self-pay | Admitting: Emergency Medicine

## 2015-10-17 ENCOUNTER — Observation Stay (HOSPITAL_COMMUNITY)
Admission: EM | Admit: 2015-10-17 | Discharge: 2015-10-17 | Disposition: A | Discharge status: Home / Self Care | Payer: Medicare Other | Attending: Internal Medicine | Admitting: Internal Medicine

## 2015-10-17 DIAGNOSIS — E8779 Other fluid overload: Secondary | ICD-10-CM

## 2015-10-17 DIAGNOSIS — Z79899 Other long term (current) drug therapy: Secondary | ICD-10-CM | POA: Diagnosis not present

## 2015-10-17 DIAGNOSIS — F319 Bipolar disorder, unspecified: Secondary | ICD-10-CM | POA: Insufficient documentation

## 2015-10-17 DIAGNOSIS — D631 Anemia in chronic kidney disease: Secondary | ICD-10-CM | POA: Diagnosis not present

## 2015-10-17 DIAGNOSIS — N186 End stage renal disease: Secondary | ICD-10-CM

## 2015-10-17 DIAGNOSIS — I132 Hypertensive heart and chronic kidney disease with heart failure and with stage 5 chronic kidney disease, or end stage renal disease: Secondary | ICD-10-CM | POA: Diagnosis not present

## 2015-10-17 DIAGNOSIS — F250 Schizoaffective disorder, bipolar type: Secondary | ICD-10-CM | POA: Insufficient documentation

## 2015-10-17 DIAGNOSIS — Z4901 Encounter for fitting and adjustment of extracorporeal dialysis catheter: Secondary | ICD-10-CM | POA: Diagnosis not present

## 2015-10-17 DIAGNOSIS — I509 Heart failure, unspecified: Secondary | ICD-10-CM | POA: Insufficient documentation

## 2015-10-17 DIAGNOSIS — E8770 Fluid overload, unspecified: Secondary | ICD-10-CM | POA: Diagnosis not present

## 2015-10-17 DIAGNOSIS — I10 Essential (primary) hypertension: Secondary | ICD-10-CM | POA: Diagnosis not present

## 2015-10-17 DIAGNOSIS — R609 Edema, unspecified: Secondary | ICD-10-CM

## 2015-10-17 DIAGNOSIS — R600 Localized edema: Secondary | ICD-10-CM | POA: Diagnosis not present

## 2015-10-17 DIAGNOSIS — Z992 Dependence on renal dialysis: Secondary | ICD-10-CM

## 2015-10-17 DIAGNOSIS — F17210 Nicotine dependence, cigarettes, uncomplicated: Secondary | ICD-10-CM | POA: Diagnosis not present

## 2015-10-17 LAB — I-STAT CHEM 8, ED
BUN: 56 mg/dL — ABNORMAL HIGH (ref 6–20)
CALCIUM ION: 1.11 mmol/L — AB (ref 1.13–1.30)
Chloride: 97 mmol/L — ABNORMAL LOW (ref 101–111)
Creatinine, Ser: 10 mg/dL — ABNORMAL HIGH (ref 0.44–1.00)
GLUCOSE: 111 mg/dL — AB (ref 65–99)
HCT: 27 % — ABNORMAL LOW (ref 36.0–46.0)
HEMOGLOBIN: 9.2 g/dL — AB (ref 12.0–15.0)
Potassium: 4 mmol/L (ref 3.5–5.1)
SODIUM: 137 mmol/L (ref 135–145)
TCO2: 26 mmol/L (ref 0–100)

## 2015-10-17 MED ORDER — EPOETIN ALFA 4000 UNIT/ML IJ SOLN
4000.0000 [IU] | INTRAMUSCULAR | Status: DC
  Filled 2015-10-17 (×2): qty 1

## 2015-10-17 MED ORDER — METOPROLOL TARTRATE 50 MG PO TABS: 50.0000 mg | ORAL_TABLET | Freq: Two times a day (BID) | ORAL | Status: DC

## 2015-10-17 MED ORDER — DIPHENHYDRAMINE HCL 25 MG PO CAPS
25.0000 mg | ORAL_CAPSULE | Freq: Four times a day (QID) | ORAL | Status: DC | PRN
  Administered 2015-10-17: 25 mg via ORAL

## 2015-10-17 MED ORDER — SODIUM CHLORIDE 0.9 % IV SOLN: 100.0000 mL | INTRAVENOUS | Status: DC | PRN

## 2015-10-17 MED ORDER — LIDOCAINE-PRILOCAINE 2.5-2.5 % EX CREA: 1.0000 "application " | TOPICAL_CREAM | CUTANEOUS | Status: DC | PRN

## 2015-10-17 MED ORDER — HEPARIN SODIUM (PORCINE) 1000 UNIT/ML DIALYSIS: 1000.0000 [IU] | INTRAMUSCULAR | Status: DC | PRN

## 2015-10-17 MED ORDER — HEPARIN SODIUM (PORCINE) 1000 UNIT/ML DIALYSIS
20.0000 [IU]/kg | INTRAMUSCULAR | Status: DC | PRN
  Administered 2015-10-17: 1900 [IU] via INTRAVENOUS_CENTRAL

## 2015-10-17 MED ORDER — LIDOCAINE HCL (PF) 1 % IJ SOLN
5.0000 mL | INTRAMUSCULAR | Status: DC | PRN
  Administered 2015-10-17: 0.5 mL via INTRADERMAL

## 2015-10-17 MED ORDER — ENOXAPARIN SODIUM 30 MG/0.3ML ~~LOC~~ SOLN: 30.0000 mg | SUBCUTANEOUS | Status: DC

## 2015-10-17 MED ORDER — EPOETIN ALFA 4000 UNIT/ML IJ SOLN
INTRAMUSCULAR | Status: AC
  Administered 2015-10-17: 4000 [IU] via INTRAVENOUS
  Filled 2015-10-17: qty 1

## 2015-10-17 MED ORDER — HEPARIN SODIUM (PORCINE) 1000 UNIT/ML IJ SOLN
INTRAMUSCULAR | Status: AC
  Administered 2015-10-17: 1900 [IU] via INTRAVENOUS_CENTRAL
  Filled 2015-10-17: qty 4

## 2015-10-17 MED ORDER — LIDOCAINE HCL (PF) 1 % IJ SOLN
INTRAMUSCULAR | Status: AC
  Administered 2015-10-17: 0.5 mL via INTRADERMAL
  Filled 2015-10-17: qty 5

## 2015-10-17 MED ORDER — TRAMADOL HCL 50 MG PO TABS
50.0000 mg | ORAL_TABLET | Freq: Two times a day (BID) | ORAL | Status: DC | PRN
  Administered 2015-10-17: 50 mg via ORAL

## 2015-10-17 MED ORDER — TRAMADOL HCL 50 MG PO TABS
ORAL_TABLET | ORAL | Status: AC
  Administered 2015-10-17: 50 mg via ORAL
  Filled 2015-10-17: qty 1

## 2015-10-17 MED ORDER — ALTEPLASE 2 MG IJ SOLR: 2.0000 mg | Freq: Once | INTRAMUSCULAR | Status: DC | PRN

## 2015-10-17 MED ORDER — PREDNISONE 20 MG PO TABS: 20.0000 mg | ORAL_TABLET | Freq: Every day | ORAL | Status: DC

## 2015-10-17 MED ORDER — FUROSEMIDE 80 MG PO TABS: 120.0000 mg | ORAL_TABLET | Freq: Every day | ORAL | Status: DC

## 2015-10-17 MED ORDER — DIPHENHYDRAMINE HCL 25 MG PO CAPS
ORAL_CAPSULE | ORAL | Status: AC
  Administered 2015-10-17: 25 mg via ORAL
  Filled 2015-10-17: qty 1

## 2015-10-17 MED ORDER — PENTAFLUOROPROP-TETRAFLUOROETH EX AERO: 1.0000 "application " | INHALATION_SPRAY | CUTANEOUS | Status: DC | PRN

## 2015-10-17 MED ORDER — EPOETIN ALFA 4000 UNIT/ML IJ SOLN
4000.0000 [IU] | INTRAMUSCULAR | Status: DC
  Administered 2015-10-17: 4000 [IU] via INTRAVENOUS
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
## 2015-10-17 NOTE — Progress Notes (Signed)
Left AMA, refused to have MRSA swab done, MD notified.

## 2015-10-17 NOTE — ED Notes (Signed)
PT requesting dialysis and states last Dialysis this past Monday on 10/15/15 for 4.5 hours.

## 2015-10-17 NOTE — ED Notes (Signed)
Lunch tray provided. 

## 2015-10-17 NOTE — Discharge Summary (Signed)
Physician Discharge Summary  Sara [name] [name] DOB: 09-03-1980 DOA: 10/17/2015  PCP: No primary care provider on file.  Admit date: 10/17/2015 Discharge date: 10/17/2015  Patient left the hospital AGAINST MEDICAL ADVICE  Brief/Interim Summary: This patient was admitted to the hospital for regular dialysis. She did complain of some edema in her lower extremities. She reports compliance with her medications and fluids/dietary restrictions. The patient denied any shortness of breath or chest pain. She was admitted under observation and underwent dialysis treatment. The patient routinely presents to any hospital to undergo dialysis since she's not set up with a outpatient center. After treatment, patient decided to leave the hospital [place] as she often does.  Discharge Diagnoses:  Active Problems:   ESRD on hemodialysis (HCC)   ESRD (end stage renal disease) (HCC) Essential HTN Volume overload    Allergies  Allergen Reactions  . Ativan [Lorazepam] Swelling and Other (See Comments)    Dysarthria(patient has difficulty speaking and slurred speech); denies swelling, itching, pain, or numbness.  [name] [Ziprasidone Hcl] Itching and Swelling    Tongue swelling  . Keflex [Cephalexin] Swelling and Other (See Comments)    Tongue swelling. Can't talk   . Haldol [Haloperidol Lactate] Swelling    Tongue swelling. 05/31/15 - MD ok with giving as pt has tolerated in the past Pt can take benadryl.  . Other Itching    wool    Consultations:  nephrology   Procedures/Studies: Dg Chest 2 View  09/28/2015  CLINICAL DATA:  Sob today/smoker/htn/ EXAM: CHEST  2 VIEW COMPARISON:  08/22/2015 FINDINGS: Moderate cardiac enlargement with mild to moderate vascular congestion. This is similar to the prior study. No definite pulmonary edema, but minimal blunting of right costophrenic angle is noted which could indicate chronic thickening or tiny effusion. If this finding is  unchanged there is progressive osseous hypertrophy with erosive change of both acromioclavicular joints. IMPRESSION: Cardiac enlargement and vascular congestion without definite pulmonary edema. Progressive acromioclavicular joint abnormality bilaterally with differential diagnosis including hyperparathyroidism, rheumatoid arthritis, scleroderma, and bilateral trauma. Electronically Signed   By: Skipper Cliche M.D.   On: 09/28/2015 10:24          The results of significant diagnostics from this hospitalization (including imaging, microbiology, ancillary and laboratory) are listed below for reference.     Microbiology: No results found for this or any previous visit (from the past 240 hour(s)).   Labs: BNP (last 3 results) No results for input(s): BNP in the last 8760 hours. Basic Metabolic Panel:  Recent Labs Lab 10/12/15 0921 10/15/15 0843 10/15/15 1308 10/15/15 1309 10/17/15 0935  NA 136 136  --  136 137  K 4.2 4.9  --  5.3* 4.0  CL 99* 104  --  99* 97*  CO2  --   --   --  22  --   GLUCOSE 122* 94  --  139* 111*  BUN 57* 83*  --  96* 56*  CREATININE 10.10* 13.60*  --  13.66* 10.00*  CALCIUM  --   --  9.3 9.1  --   PHOS  --   --   --  11.0*  --    Liver Function Tests:  Recent Labs Lab 10/15/15 1309  ALBUMIN 3.1*   No results for input(s): LIPASE, AMYLASE in the last 168 hours. No results for input(s): AMMONIA in the last 168 hours. CBC:  Recent Labs Lab 10/12/15 0921 10/15/15 0843 10/15/15 1308 10/17/15 0935  WBC  --   --  8.0  --   HGB 10.5* 9.9* 7.9* 9.2*  HCT 31.0* 29.0* 23.7* 27.0*  MCV  --   --  86.2  --   PLT  --   --  220  --    Cardiac Enzymes: No results for input(s): CKTOTAL, CKMB, CKMBINDEX, TROPONINI in the last 168 hours. BNP: Invalid input(s): POCBNP CBG: No results for input(s): GLUCAP in the last 168 hours. D-Dimer No results for input(s): DDIMER in the last 72 hours. Hgb A1c No results for input(s): HGBA1C in the last 72  hours. Lipid Profile No results for input(s): CHOL, HDL, LDLCALC, TRIG, CHOLHDL, LDLDIRECT in the last 72 hours. Thyroid function studies No results for input(s): TSH, T4TOTAL, T3FREE, THYROIDAB in the last 72 hours.  Invalid input(s): FREET3 Anemia work up No results for input(s): VITAMINB12, FOLATE, FERRITIN, TIBC, IRON, RETICCTPCT in the last 72 hours. Urinalysis    Component Value Date/Time   COLORURINE YELLOW 08/11/2015 1235   APPEARANCEUR HAZY* 08/11/2015 1235   LABSPEC 1.015 08/11/2015 1235   PHURINE 7.5 08/11/2015 1235   GLUCOSEU 100* 08/11/2015 1235   HGBUR SMALL* 08/11/2015 1235   BILIRUBINUR NEGATIVE 08/11/2015 1235   KETONESUR NEGATIVE 08/11/2015 1235   PROTEINUR >300* 08/11/2015 1235   UROBILINOGEN 0.2 01/09/2015 1645   NITRITE NEGATIVE 08/11/2015 1235   LEUKOCYTESUR SMALL* 08/11/2015 1235   Sepsis Labs Invalid input(s): PROCALCITONIN,  WBC,  LACTICIDVEN Microbiology No results found for this or any previous visit (from the past 240 hour(s)).   Time coordinating discharge: Over 30 minutes  SIGNED:   Kathie Dike, MD  [facility] 10/17/2015, 6:58 PM Pager   If 7PM-7AM, please contact night-coverage [email or web address] Password [name]

## 2015-10-17 NOTE — ED Notes (Signed)
Pt currently sleeping. NAD noted. Equal rise and fall of chest noted.

## 2015-10-17 NOTE — Consult Note (Signed)
SAMAYA BYL MRN: [id number] DOB/AGE: 1980-07-14 35 y.o. Primary Care Physician:No primary care provider on file. Admit date: 10/17/2015 Chief Complaint:  Chief Complaint  Patient presents with  . Dialysis    HPI: Pt is 35 year old female with past medical hx of ESRD who was admitted with c/o dyspnea," I need dialysis "  HPI dates back to few days ago started feeling short of breath,and so pt came to ER asking for her dialysis. Pt says " I need my dialysis .O am full of fluid. I had my dialysis last Monday ". No c/o fever/cough/chills NO c/o nausea/vomiting/ diarrhea No c/o hematuria Pt was seen  for need of renal replacement therapy.   Past Medical History  Diagnosis Date  . 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 History of blood transfusion     "this is probably my 3rd" (10/09/2014)  . ESRD (end stage renal disease) on dialysis [facility])     "[name]; [facility]" (10/09/2014)  . Bipolar disorder ([place])     [name] 10/09/2014  . Schizophrenia ([place])     [name] 10/09/2014  . Chronic anemia     [name] 10/09/2014  . CHF (congestive heart failure) ([place])     systolic/notes [id number]  . Acute myopericarditis     hx/notes 10/09/2014  . Psychosis   . Hypertension   . Pregnancy   . Low back pain   . Positive ANA (antinuclear antibody) 08/16/2012  . 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 Tobacco abuse 02/20/2014  . Schizoaffective disorder, bipolar type ([place]) 11/20/2014  . Non-compliant patient         Family History  Problem Relation Age of Onset  . Drug abuse Father   . Kidney disease Father     Social History:  reports that she has been smoking Cigarettes.  She has a 2 pack-year smoking history. She uses smokeless tobacco. She reports that she does not drink alcohol or use illicit drugs.   Allergies:  Allergies  Allergen Reactions  . Ativan [Lorazepam] Swelling and Other (See Comments)    Dysarthria(patient has difficulty speaking and slurred speech);  denies swelling, itching, pain, or numbness.  [name] [Ziprasidone Hcl] Itching and Swelling    Tongue swelling  . Keflex [Cephalexin] Swelling and Other (See Comments)    Tongue swelling. Can't talk   . Haldol [Haloperidol Lactate] Swelling    Tongue swelling. 05/31/15 - MD ok with giving as pt has tolerated in the past Pt can take benadryl.  . Other Itching    wool     (Not in a hospital admission)     [id number] from the symptoms mentioned above,there are no other symptoms referable to all systems reviewed.      Physical Exam: Vital signs in last 24 hours: Temp:  [97.9 F (36.6 C)] 97.9 F (36.6 C) (07/12 0920) Pulse Rate:  [105] 105 (07/12 0920) Resp:  [22] 22 (07/12 0920) BP: (135)/(98) 135/98 mmHg (07/12 0920) SpO2:  [100 %] 100 % (07/12 0920) Weight:  [210 lb (95.255 kg)] 210 lb (95.255 kg) (07/12 0920) Weight change:     Intake/Output from previous day:       Physical Exam: General- pt is awake,alert, follows coomands Resp- No acute REsp distress,  decreased bs at bases. CVS- S1S2 regular in rate and rhythm, NO rubs  GIT- BS+, soft, NT, ND EXT- 2+ LE Edema, NO Cyanosis CNS- CN 2-12 grossly intact. Moving all 4 extremities  Access- AVF+, aneurysm present    Lab Results:  CBC    Component Value Date/Time   WBC 8.0 10/15/2015 1308   RBC 2.75* 10/15/2015 1308   RBC 2.77* 06/21/2015 1022   HGB 9.2* 10/17/2015 0935   HCT 27.0* 10/17/2015 0935   PLT 220 10/15/2015 1308   MCV 86.2 10/15/2015 1308   MCH 28.7 10/15/2015 1308   MCHC 33.3 10/15/2015 1308   RDW 17.1* 10/15/2015 1308   LYMPHSABS 1.0 09/21/2015 0841   MONOABS 0.7 09/21/2015 0841   EOSABS 0.1 09/21/2015 0841   BASOSABS 0.0 09/21/2015 0841      BMET  Recent Labs  10/15/15 1308 10/15/15 1309 10/17/15 0935  NA  --  136 137  K  --  5.3* 4.0  CL  --  99* 97*  CO2  --  22  --   GLUCOSE  --  139* 111*  BUN  --  96* 56*  CREATININE  --  13.66* 10.00*  CALCIUM 9.3 9.1  --      MICRO No results found for this or any previous visit (from the past 240 hour(s)).    Lab Results  Component Value Date   PTH 1530* 10/15/2015   PTH Comment 10/15/2015   CALCIUM 9.1 10/15/2015   CAION 1.11* 10/17/2015   PHOS 11.0* 10/15/2015      Impression: 1)Renal  ESRD on HD                Pt is not on regular  Schedule sec to her complaince/adherence issues                Will dialyze pt today  2)HTN Bp is not at goal  3)Anemia In ESRD the goal for HGb is 9--11. Pt HGb is at goal Will keep  on epo   4)CKD Mineral-Bone Disorder PTH acceptable. Secondary Hyperparathyroidism  Present. Phosphorus not at goal.  on binders  5)Psych .  Hx of   psycosis Schizophrenia Primary MD following  6)Electrolytes  Normokalemic  Hyponatremic    Sec to ESRD  7)Acid base Co2 not  at goal Sec to no adherence to HD   Plan:  Will dialyze today Will keep  on epo Will use 2 k bath Will try to take 2.5-3 liters off     BHUTANI,MANPREET S 10/17/2015, 10:19 AM

## 2015-10-17 NOTE — Procedures (Signed)
   HEMODIALYSIS TREATMENT NOTE:  4 hour heparin-free dialysis completed via right upper arm AVF (15g ante/retrograde). Goal met: 3 liters removed without interruption in ultrafiltration. All blood was returned and hemostasis was achieved within 15 minutes. Report called to Janan Ridge, RN.  Rockwell Alexandria, RN, CDN
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
## 2015-10-20 ENCOUNTER — Observation Stay (HOSPITAL_COMMUNITY)
Admission: EM | Admit: 2015-10-20 | Discharge: 2015-10-20 | Disposition: A | Discharge status: Home / Self Care | Payer: Medicare Other | Attending: Internal Medicine | Admitting: Internal Medicine

## 2015-10-20 ENCOUNTER — Encounter (HOSPITAL_COMMUNITY): Payer: Self-pay | Admitting: Emergency Medicine

## 2015-10-20 DIAGNOSIS — E8770 Fluid overload, unspecified: Secondary | ICD-10-CM | POA: Diagnosis not present

## 2015-10-20 DIAGNOSIS — N186 End stage renal disease: Secondary | ICD-10-CM | POA: Diagnosis not present

## 2015-10-20 DIAGNOSIS — F17210 Nicotine dependence, cigarettes, uncomplicated: Secondary | ICD-10-CM | POA: Diagnosis not present

## 2015-10-20 DIAGNOSIS — E8779 Other fluid overload: Secondary | ICD-10-CM | POA: Diagnosis not present

## 2015-10-20 DIAGNOSIS — I132 Hypertensive heart and chronic kidney disease with heart failure and with stage 5 chronic kidney disease, or end stage renal disease: Principal | ICD-10-CM | POA: Insufficient documentation

## 2015-10-20 DIAGNOSIS — I120 Hypertensive chronic kidney disease with stage 5 chronic kidney disease or end stage renal disease: Secondary | ICD-10-CM | POA: Diagnosis not present

## 2015-10-20 DIAGNOSIS — I509 Heart failure, unspecified: Secondary | ICD-10-CM | POA: Insufficient documentation

## 2015-10-20 DIAGNOSIS — Z992 Dependence on renal dialysis: Secondary | ICD-10-CM | POA: Diagnosis not present

## 2015-10-20 DIAGNOSIS — Z79899 Other long term (current) drug therapy: Secondary | ICD-10-CM | POA: Diagnosis not present

## 2015-10-20 DIAGNOSIS — F250 Schizoaffective disorder, bipolar type: Secondary | ICD-10-CM | POA: Insufficient documentation

## 2015-10-20 DIAGNOSIS — I10 Essential (primary) hypertension: Secondary | ICD-10-CM

## 2015-10-20 DIAGNOSIS — D631 Anemia in chronic kidney disease: Secondary | ICD-10-CM | POA: Diagnosis not present

## 2015-10-20 LAB — I-STAT CHEM 8, ED
BUN: 57 mg/dL — ABNORMAL HIGH (ref 6–20)
CHLORIDE: 98 mmol/L — AB (ref 101–111)
Calcium, Ion: 1.11 mmol/L — ABNORMAL LOW (ref 1.13–1.30)
Creatinine, Ser: 9.7 mg/dL — ABNORMAL HIGH (ref 0.44–1.00)
GLUCOSE: 130 mg/dL — AB (ref 65–99)
HEMATOCRIT: 27 % — AB (ref 36.0–46.0)
HEMOGLOBIN: 9.2 g/dL — AB (ref 12.0–15.0)
POTASSIUM: 4.3 mmol/L (ref 3.5–5.1)
SODIUM: 137 mmol/L (ref 135–145)
TCO2: 26 mmol/L (ref 0–100)

## 2015-10-20 MED ORDER — SODIUM CHLORIDE 0.9 % IV SOLN: 100.0000 mL | INTRAVENOUS | Status: DC | PRN

## 2015-10-20 MED ORDER — LIDOCAINE-PRILOCAINE 2.5-2.5 % EX CREA: 1.0000 "application " | TOPICAL_CREAM | CUTANEOUS | Status: DC | PRN

## 2015-10-20 MED ORDER — TRAMADOL HCL 50 MG PO TABS
ORAL_TABLET | ORAL | Status: AC
  Administered 2015-10-20: 50 mg via ORAL
  Filled 2015-10-20: qty 1

## 2015-10-20 MED ORDER — METOPROLOL TARTRATE 50 MG PO TABS: 50.0000 mg | ORAL_TABLET | Freq: Two times a day (BID) | ORAL | Status: DC

## 2015-10-20 MED ORDER — PREDNISONE 20 MG PO TABS: 20.0000 mg | ORAL_TABLET | Freq: Every day | ORAL | Status: DC

## 2015-10-20 MED ORDER — ONDANSETRON HCL 4 MG/2ML IJ SOLN: 4.0000 mg | Freq: Four times a day (QID) | INTRAMUSCULAR | Status: DC | PRN

## 2015-10-20 MED ORDER — EPOETIN ALFA 10000 UNIT/ML IJ SOLN
INTRAMUSCULAR | Status: AC
  Administered 2015-10-20: 8000 [IU] via INTRAVENOUS
  Filled 2015-10-20: qty 1

## 2015-10-20 MED ORDER — HEPARIN SODIUM (PORCINE) 1000 UNIT/ML DIALYSIS
20.0000 [IU]/kg | INTRAMUSCULAR | Status: DC | PRN
  Administered 2015-10-20: 1500 [IU] via INTRAVENOUS_CENTRAL
  Filled 2015-10-20: qty 2

## 2015-10-20 MED ORDER — HEPARIN SODIUM (PORCINE) 1000 UNIT/ML IJ SOLN
INTRAMUSCULAR | Status: AC
  Administered 2015-10-20: 1500 [IU] via INTRAVENOUS_CENTRAL
  Filled 2015-10-20: qty 6

## 2015-10-20 MED ORDER — PENTAFLUOROPROP-TETRAFLUOROETH EX AERO: 1.0000 "application " | INHALATION_SPRAY | CUTANEOUS | Status: DC | PRN

## 2015-10-20 MED ORDER — EPOETIN ALFA 10000 UNIT/ML IJ SOLN
8000.0000 [IU] | INTRAMUSCULAR | Status: DC
  Administered 2015-10-20: 8000 [IU] via INTRAVENOUS

## 2015-10-20 MED ORDER — FUROSEMIDE 40 MG PO TABS: 120.0000 mg | ORAL_TABLET | Freq: Every day | ORAL | Status: DC

## 2015-10-20 MED ORDER — EPOETIN ALFA 10000 UNIT/ML IJ SOLN: 8000.0000 [IU] | INTRAMUSCULAR | Status: DC

## 2015-10-20 MED ORDER — LIDOCAINE HCL (PF) 1 % IJ SOLN
5.0000 mL | INTRAMUSCULAR | Status: DC | PRN
  Administered 2015-10-20: 0.5 mL via INTRADERMAL

## 2015-10-20 MED ORDER — ACETAMINOPHEN 500 MG PO TABS: 1000.0000 mg | ORAL_TABLET | Freq: Four times a day (QID) | ORAL | Status: DC | PRN

## 2015-10-20 MED ORDER — HYDRALAZINE HCL 10 MG PO TABS: 10.0000 mg | ORAL_TABLET | Freq: Three times a day (TID) | ORAL | Status: DC

## 2015-10-20 MED ORDER — DIPHENHYDRAMINE HCL 25 MG PO CAPS
ORAL_CAPSULE | ORAL | Status: AC
  Administered 2015-10-20: 25 mg via ORAL
  Filled 2015-10-20: qty 1

## 2015-10-20 MED ORDER — DIPHENHYDRAMINE HCL 25 MG PO CAPS
25.0000 mg | ORAL_CAPSULE | Freq: Four times a day (QID) | ORAL | Status: DC | PRN
  Administered 2015-10-20: 25 mg via ORAL

## 2015-10-20 MED ORDER — TRAMADOL HCL 50 MG PO TABS
50.0000 mg | ORAL_TABLET | Freq: Two times a day (BID) | ORAL | Status: DC | PRN
  Administered 2015-10-20: 50 mg via ORAL

## 2015-10-20 MED ORDER — LIDOCAINE HCL (PF) 1 % IJ SOLN
INTRAMUSCULAR | Status: AC
  Administered 2015-10-20: 0.5 mL via INTRADERMAL
  Filled 2015-10-20: qty 5

## 2015-10-20 MED ORDER — EPOETIN ALFA 4000 UNIT/ML IJ SOLN
INTRAMUSCULAR | Status: AC
  Filled 2015-10-20: qty 1

## 2015-10-20 MED ORDER — ONDANSETRON HCL 4 MG PO TABS: 4.0000 mg | ORAL_TABLET | Freq: Four times a day (QID) | ORAL | Status: DC | PRN

## 2015-10-20 NOTE — Procedures (Addendum)
   HEMODIALYSIS TREATMENT NOTE:  4.5 hour low-heparin dialysis completed via right upper arm AVF (15g ante/retrograde). Goal met: 3.1 liters removed without interruption in ultrafiltration. All blood was returned and hemostasis was achieved in 6 minutes. Pt states she intends to leave hospital AMA but she wants a meal first.  No bed assignment as of yet therefore pt was escorted back to ED.  Rockwell Alexandria, RN, CDN   Addendum:  Pt did have room assigned.  Was escorted from ED to third floor waiting area.  States, "I've been here since 8 and I haven't had anything to eat."  Will leave AMA after she receives a meal.
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
## 2015-10-20 NOTE — ED Notes (Signed)
RN spoke with [facility] RN, pt transported to dialysis suite.

## 2015-10-20 NOTE — Discharge Summary (Signed)
Physician Discharge Summary  MYLI VANPOOL [name] DOB: May 04, 1980 DOA: 10/20/2015  PCP: No primary care provider on file.  Admit date: 10/20/2015 Discharge date: 10/20/2015  Admitted From: home Disposition:  home  Recommendations for Outpatient Follow-up:  1. Follow up with PCP in 1-2 weeks 2.   Home Health:No Equipment/Devices:None  Discharge Condition:stable CODE STATUS:full code Diet recommendation: heart healthy  Brief/Interim Summary: This patient presented to the hospital for regular dialysis.Patient has end-stage renal disease and is not followed at outpatient dialysis center. She comes into the hospital to obtain regular dialysis. Today, she complained of chest pain. This was significantly improved after dialysis. She is no longer having any discomfort. Her dialysis treatment was unremarkable. She feels back to her usual state. She does not have any shortness of breath. Patient is otherwise stable for discharge.  Discharge Diagnoses:  Active Problems:   Essential hypertension   Volume overload   ESRD on hemodialysis ([place])   ESRD (end stage renal disease) [facility])    Discharge Instructions  Discharge Instructions    Diet - low sodium heart healthy    Complete by:  As directed      Increase activity slowly    Complete by:  As directed             Medication List    TAKE these medications        acetaminophen 500 MG tablet  Commonly known as:  TYLENOL  Take 1,000 mg by mouth every 6 (six) hours as needed for moderate pain. Reported on 06/12/2015     furosemide 40 MG tablet  Commonly known as:  LASIX  Take 3 tablets (120 mg total) by mouth daily.     hydrALAZINE 10 MG tablet  Commonly known as:  APRESOLINE  Take 1 tablet (10 mg total) by mouth 3 (three) times daily.     metoprolol 50 MG tablet  Commonly known as:  LOPRESSOR  Take 1 tablet (50 mg total) by mouth 2 (two) times daily.     predniSONE 20 MG tablet  Commonly known as:  DELTASONE   Take 1 tablet (20 mg total) by mouth daily with breakfast.     risperiDONE microspheres 25 MG injection  Commonly known as:  RISPERDAL CONSTA  Inject 25 mg into the muscle every 14 (fourteen) days.        Allergies  Allergen Reactions  . Ativan [Lorazepam] Swelling and Other (See Comments)    Dysarthria(patient has difficulty speaking and slurred speech); denies swelling, itching, pain, or numbness.  [name] [Ziprasidone Hcl] Itching and Swelling    Tongue swelling  . Keflex [Cephalexin] Swelling and Other (See Comments)    Tongue swelling. Can't talk   . Haldol [Haloperidol Lactate] Swelling    Tongue swelling. 05/31/15 - MD ok with giving as pt has tolerated in the past Pt can take benadryl.  . Other Itching    wool    Consultations:  Nephrology   Procedures/Studies: Dg Chest 2 View  09/28/2015  CLINICAL DATA:  Sob today/smoker/htn/ EXAM: CHEST  2 VIEW COMPARISON:  08/22/2015 FINDINGS: Moderate cardiac enlargement with mild to moderate vascular congestion. This is similar to the prior study. No definite pulmonary edema, but minimal blunting of right costophrenic angle is noted which could indicate chronic thickening or tiny effusion. If this finding is unchanged there is progressive osseous hypertrophy with erosive change of both acromioclavicular joints. IMPRESSION: Cardiac enlargement and vascular congestion without definite pulmonary edema. Progressive acromioclavicular joint abnormality bilaterally with  differential diagnosis including hyperparathyroidism, rheumatoid arthritis, scleroderma, and bilateral trauma. Electronically Signed   By: Skipper Cliche M.D.   On: 09/28/2015 10:24       Subjective: Reports chest pain has resolved after dialysis treatment  Discharge Exam: Filed Vitals:   10/20/15 1510 10/20/15 1534  BP: 150/90 141/96  Pulse:  94  Temp:  97.7 F (36.5 C)  Resp:  20   Filed Vitals:   10/20/15 1430 10/20/15 1500 10/20/15 1510 10/20/15 1534   BP: 139/89 149/87 150/90 141/96  Pulse: 94 93  94  Temp:    97.7 F (36.5 C)  TempSrc:    Oral  Resp:    20  Height:      Weight:      SpO2:    100%    General: Pt is alert, awake, not in acute distress Cardiovascular: RRR, S1/S2 +, no rubs, no gallops Respiratory: CTA bilaterally, no wheezing, no rhonchi Abdominal: Soft, NT, ND, bowel sounds + Extremities: 1+ edema, no cyanosis    The results of significant diagnostics from this hospitalization (including imaging, microbiology, ancillary and laboratory) are listed below for reference.     Microbiology: No results found for this or any previous visit (from the past 240 hour(s)).   Labs: BNP (last 3 results) No results for input(s): BNP in the last 8760 hours. Basic Metabolic Panel:  Recent Labs Lab 10/15/15 0843 10/15/15 1308 10/15/15 1309 10/17/15 0935 10/20/15 0842  NA 136  --  136 137 137  K 4.9  --  5.3* 4.0 4.3  CL 104  --  99* 97* 98*  CO2  --   --  22  --   --   GLUCOSE 94  --  139* 111* 130*  BUN 83*  --  96* 56* 57*  CREATININE 13.60*  --  13.66* 10.00* 9.70*  CALCIUM  --  9.3 9.1  --   --   PHOS  --   --  11.0*  --   --    Liver Function Tests:  Recent Labs Lab 10/15/15 1309  ALBUMIN 3.1*   No results for input(s): LIPASE, AMYLASE in the last 168 hours. No results for input(s): AMMONIA in the last 168 hours. CBC:  Recent Labs Lab 10/15/15 0843 10/15/15 1308 10/17/15 0935 10/20/15 0842  WBC  --  8.0  --   --   HGB 9.9* 7.9* 9.2* 9.2*  HCT 29.0* 23.7* 27.0* 27.0*  MCV  --  86.2  --   --   PLT  --  220  --   --    Cardiac Enzymes: No results for input(s): CKTOTAL, CKMB, CKMBINDEX, TROPONINI in the last 168 hours. BNP: Invalid input(s): POCBNP CBG: No results for input(s): GLUCAP in the last 168 hours. D-Dimer No results for input(s): DDIMER in the last 72 hours. Hgb A1c No results for input(s): HGBA1C in the last 72 hours. Lipid Profile No results for input(s): CHOL, HDL,  LDLCALC, TRIG, CHOLHDL, LDLDIRECT in the last 72 hours. Thyroid function studies No results for input(s): TSH, T4TOTAL, T3FREE, THYROIDAB in the last 72 hours.  Invalid input(s): FREET3 Anemia work up No results for input(s): VITAMINB12, FOLATE, FERRITIN, TIBC, IRON, RETICCTPCT in the last 72 hours. Urinalysis    Component Value Date/Time   COLORURINE YELLOW 08/11/2015 1235   APPEARANCEUR HAZY* 08/11/2015 1235   LABSPEC 1.015 08/11/2015 1235   PHURINE 7.5 08/11/2015 1235   GLUCOSEU 100* 08/11/2015 1235   HGBUR SMALL* 08/11/2015 1235  BILIRUBINUR NEGATIVE 08/11/2015 [place] 08/11/2015 1235   PROTEINUR >300* 08/11/2015 1235   UROBILINOGEN 0.2 01/09/2015 1645   NITRITE NEGATIVE 08/11/2015 1235   LEUKOCYTESUR SMALL* 08/11/2015 1235   Sepsis Labs Invalid input(s): PROCALCITONIN,  WBC,  LACTICIDVEN Microbiology No results found for this or any previous visit (from the past 240 hour(s)).   Time coordinating discharge: Over 30 minutes  SIGNED:   Kathie Dike, MD  [facility] 10/20/2015, 4:05 PM Pager [phone]  If 7PM-7AM, please contact night-coverage [email or web address] Password [name]

## 2015-10-20 NOTE — Consult Note (Signed)
Sara [name] MRN: [id number] DOB/AGE: May 05, 1980 35 y.o. Primary Care Physician:No primary care provider on file. Admit date: 10/20/2015 Chief Complaint:  Chief Complaint  Patient presents with  . Dialysis    HPI: Pt is 35 year old female with past medical hx of ESRD who was admitted with c/o dyspnea," I need dialysis "  HPI dates back to few days ago started feeling short of breath,and so pt came to ER asking for her dialysis. Pt says " I need my dialysis .I am full of fluid. I had my dialysis this Wednesday". No c/o fever/cough/chills NO c/o nausea/vomiting/ diarrhea No c/o hematuria Pt was seen  for need of renal replacement therapy. I still have no heard from outpt Hd center.     Past Medical History  Diagnosis Date  . Lupus ([place])     lupus w nephritis  . History of blood transfusion     "this is probably my 3rd" (10/09/2014)  . ESRD (end stage renal disease) on dialysis [facility])     "[name]; [facility]" (10/09/2014)  . Bipolar disorder ([place])     [name] 10/09/2014  . Schizophrenia ([place])     [name] 10/09/2014  . Chronic anemia     [name] 10/09/2014  . CHF (congestive heart failure) ([place])     systolic/notes 123XX123  . Acute myopericarditis     hx/notes 10/09/2014  . Psychosis   . Hypertension   . Pregnancy   . Low back pain   . Positive ANA (antinuclear antibody) 08/16/2012  . 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 Tobacco abuse 02/20/2014  . Schizoaffective disorder, bipolar type ([place]) 11/20/2014  . Non-compliant patient         Family History  Problem Relation Age of Onset  . Drug abuse Father   . Kidney disease Father     Social History:  reports that she has been smoking Cigarettes.  She has a 2 pack-year smoking history. She has never used smokeless tobacco. She reports that she does not drink alcohol or use illicit drugs.   Allergies:  Allergies  Allergen Reactions  . Ativan [Lorazepam] Swelling and Other (See Comments)   Dysarthria(patient has difficulty speaking and slurred speech); denies swelling, itching, pain, or numbness.  [name] [Ziprasidone Hcl] Itching and Swelling    Tongue swelling  . Keflex [Cephalexin] Swelling and Other (See Comments)    Tongue swelling. Can't talk   . Haldol [Haloperidol Lactate] Swelling    Tongue swelling. 05/31/15 - MD ok with giving as pt has tolerated in the past Pt can take benadryl.  . Other Itching    wool     (Not in a hospital admission)     [id number] from the symptoms mentioned above,there are no other symptoms referable to all systems reviewed.      Physical Exam: Vital signs in last 24 hours: Temp:  [97.8 F (36.6 C)] 97.8 F (36.6 C) (07/15 0819) Pulse Rate:  [106] 106 (07/15 0819) Resp:  [18] 18 (07/15 0819) BP: (152)/(104) 152/104 mmHg (07/15 0819) SpO2:  [100 %] 100 % (07/15 0819) Weight:  [160 lb (72.576 kg)] 160 lb (72.576 kg) (07/15 0819) Weight change:     Intake/Output from previous day:       Physical Exam: General- pt is awake,alert, follows coomands Resp- No acute REsp distress,  decreased bs at bases. CVS- S1S2 regular in rate and rhythm, NO rubs  GIT- BS+, soft, NT, ND EXT- 2+ LE Edema, NO  Cyanosis CNS- CN 2-12 grossly intact. Moving all 4 extremities Access- AVF+, aneurysm present    Lab Results:  CBC    Component Value Date/Time   WBC 8.0 10/15/2015 1308   RBC 2.75* 10/15/2015 1308   RBC 2.77* 06/21/2015 1022   HGB 9.2* 10/20/2015 0842   HCT 27.0* 10/20/2015 0842   PLT 220 10/15/2015 1308   MCV 86.2 10/15/2015 1308   MCH 28.7 10/15/2015 1308   MCHC 33.3 10/15/2015 1308   RDW 17.1* 10/15/2015 1308   LYMPHSABS 1.0 09/21/2015 0841   MONOABS 0.7 09/21/2015 0841   EOSABS 0.1 09/21/2015 0841   BASOSABS 0.0 09/21/2015 0841      BMET  Recent Labs  10/20/15 0842  NA 137  K 4.3  CL 98*  GLUCOSE 130*  BUN 57*  CREATININE 9.70*    MICRO No results found for this or any previous visit (from the  past 240 hour(s)).    Lab Results  Component Value Date   PTH 1530* 10/15/2015   PTH Comment 10/15/2015   CALCIUM 9.1 10/15/2015   CAION 1.11* 10/20/2015   PHOS 11.0* 10/15/2015      Impression: 1)Renal  ESRD on HD                Pt is not on regular  Schedule sec to her complaince/adherence issues                Will dialyze pt today  2)HTN Bp is not at goal  3)Anemia In ESRD the goal for HGb is 9--11. Pt HGb is at goal Will keep  on epo   4)CKD Mineral-Bone Disorder PTH acceptable. Secondary Hyperparathyroidism  Present. Phosphorus not at goal.  on binders  5)Psych .  Hx of   psycosis Schizophrenia Primary MD following  6)Electrolytes  Normokalemic  Hyponatremic    Sec to ESRD  7)Acid base Co2 not  at goal Sec to no adherence to HD   Plan:  Will dialyze today Will keep  on epo Will use 2 k bath Will try to take 2.5-3 liters off     BHUTANI,MANPREET S 10/20/2015, 9:55 AM

## 2015-10-20 NOTE — ED Notes (Signed)
Patient here to have dialysis. Per patient gets dialyzed on Mondays, Wednesday, and Fridays. Patient last had dialysis on Wednesday,was unable to get dialyzed on Friday due to no ride. Denies any shortness of breath. Blood pressure 152/104

## 2015-10-20 NOTE — Progress Notes (Signed)
Pt left hospital AMA post dialysis treatment/meal.  Pt verbalized understanding of AMA papers and ambulated off floor in stable condition.  Dr. Roderic Palau aware.

## 2015-10-22 NOTE — Discharge Summary (Signed)
  Physician Discharge Summary  Sara [name] [name] DOB: 1980-08-08 DOA: 10/15/2015  PCP: No primary care provider on file.  Admit date: 10/15/2015 Discharge date: 10/15/2015  Patient left AMA   Discharge Diagnoses:  1. ESRD with volume overload  Discharge Condition: stable Disposition: left AMA  Diet recommendation: renal diet  Filed Weights   10/15/15 1250  Weight: 94.7 kg (208 lb 12.4 oz)    History of present illness:  56 yow with a hx of ESRD and medical non-compliance who presents with complaints of volume overload and dyspnea on exertion. Patient was admitted for HD for decompensated ESRD.  Hospital Course:  Patient underwent HD without complication and subsequently left AMA after eating dinner.    Allergies  Allergen Reactions  . Ativan [Lorazepam] Swelling and Other (See Comments)    Dysarthria(patient has difficulty speaking and slurred speech); denies swelling, itching, pain, or numbness.  [name] [Ziprasidone Hcl] Itching and Swelling    Tongue swelling  . Keflex [Cephalexin] Swelling and Other (See Comments)    Tongue swelling. Can't talk   . Haldol [Haloperidol Lactate] Swelling    Tongue swelling. 05/31/15 - MD ok with giving as pt has tolerated in the past Pt can take benadryl.  . Other Itching    wool     Active Problems:   Volume overload   Fluid overload   Time coordinating discharge: 5 minutes  Signed:  Murray Hodgkins, MD [facility] 10/22/2015, 6:05 PM

## 2015-10-23 ENCOUNTER — Encounter (HOSPITAL_COMMUNITY): Payer: Self-pay

## 2015-10-23 ENCOUNTER — Emergency Department (HOSPITAL_COMMUNITY)
Admission: EM | Admit: 2015-10-23 | Discharge: 2015-10-23 | Disposition: A | Discharge status: Home / Self Care | Payer: Medicare Other | Attending: Emergency Medicine | Admitting: Emergency Medicine

## 2015-10-23 DIAGNOSIS — Z992 Dependence on renal dialysis: Secondary | ICD-10-CM | POA: Diagnosis not present

## 2015-10-23 DIAGNOSIS — N186 End stage renal disease: Secondary | ICD-10-CM | POA: Diagnosis not present

## 2015-10-23 DIAGNOSIS — F250 Schizoaffective disorder, bipolar type: Secondary | ICD-10-CM | POA: Insufficient documentation

## 2015-10-23 DIAGNOSIS — R600 Localized edema: Secondary | ICD-10-CM | POA: Diagnosis not present

## 2015-10-23 DIAGNOSIS — R609 Edema, unspecified: Secondary | ICD-10-CM

## 2015-10-23 DIAGNOSIS — F17210 Nicotine dependence, cigarettes, uncomplicated: Secondary | ICD-10-CM | POA: Insufficient documentation

## 2015-10-23 DIAGNOSIS — I132 Hypertensive heart and chronic kidney disease with heart failure and with stage 5 chronic kidney disease, or end stage renal disease: Secondary | ICD-10-CM | POA: Insufficient documentation

## 2015-10-23 DIAGNOSIS — I5020 Unspecified systolic (congestive) heart failure: Secondary | ICD-10-CM | POA: Diagnosis not present

## 2015-10-23 DIAGNOSIS — M545 Low back pain: Secondary | ICD-10-CM | POA: Diagnosis not present

## 2015-10-23 LAB — I-STAT CHEM 8, ED
BUN: 52 mg/dL — AB (ref 6–20)
CHLORIDE: 98 mmol/L — AB (ref 101–111)
CREATININE: 9.8 mg/dL — AB (ref 0.44–1.00)
Calcium, Ion: 1.14 mmol/L (ref 1.13–1.30)
GLUCOSE: 100 mg/dL — AB (ref 65–99)
HEMATOCRIT: 29 % — AB (ref 36.0–46.0)
HEMOGLOBIN: 9.9 g/dL — AB (ref 12.0–15.0)
POTASSIUM: 4.6 mmol/L (ref 3.5–5.1)
Sodium: 135 mmol/L (ref 135–145)
TCO2: 25 mmol/L (ref 0–100)
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
## 2015-10-23 NOTE — ED Notes (Signed)
I need dialysis, i had it Saturday

## 2015-10-24 ENCOUNTER — Encounter (HOSPITAL_COMMUNITY): Payer: Self-pay | Admitting: Emergency Medicine

## 2015-10-24 ENCOUNTER — Emergency Department (HOSPITAL_COMMUNITY)
Admission: EM | Admit: 2015-10-24 | Discharge: 2015-10-24 | Disposition: A | Discharge status: Home / Self Care | Payer: Medicare Other | Attending: Emergency Medicine | Admitting: Emergency Medicine

## 2015-10-24 DIAGNOSIS — I132 Hypertensive heart and chronic kidney disease with heart failure and with stage 5 chronic kidney disease, or end stage renal disease: Secondary | ICD-10-CM | POA: Insufficient documentation

## 2015-10-24 DIAGNOSIS — I120 Hypertensive chronic kidney disease with stage 5 chronic kidney disease or end stage renal disease: Secondary | ICD-10-CM | POA: Diagnosis not present

## 2015-10-24 DIAGNOSIS — I509 Heart failure, unspecified: Secondary | ICD-10-CM | POA: Insufficient documentation

## 2015-10-24 DIAGNOSIS — F319 Bipolar disorder, unspecified: Secondary | ICD-10-CM | POA: Diagnosis not present

## 2015-10-24 DIAGNOSIS — F17210 Nicotine dependence, cigarettes, uncomplicated: Secondary | ICD-10-CM | POA: Insufficient documentation

## 2015-10-24 DIAGNOSIS — Z4931 Encounter for adequacy testing for hemodialysis: Secondary | ICD-10-CM | POA: Diagnosis present

## 2015-10-24 DIAGNOSIS — Z79899 Other long term (current) drug therapy: Secondary | ICD-10-CM | POA: Insufficient documentation

## 2015-10-24 DIAGNOSIS — Z992 Dependence on renal dialysis: Secondary | ICD-10-CM | POA: Insufficient documentation

## 2015-10-24 DIAGNOSIS — R600 Localized edema: Secondary | ICD-10-CM | POA: Diagnosis not present

## 2015-10-24 DIAGNOSIS — F209 Schizophrenia, unspecified: Secondary | ICD-10-CM | POA: Insufficient documentation

## 2015-10-24 DIAGNOSIS — N186 End stage renal disease: Secondary | ICD-10-CM | POA: Insufficient documentation

## 2015-10-24 LAB — I-STAT CHEM 8, ED
BUN: 59 mg/dL — ABNORMAL HIGH (ref 6–20)
CHLORIDE: 96 mmol/L — AB (ref 101–111)
CREATININE: 10.7 mg/dL — AB (ref 0.44–1.00)
Calcium, Ion: 1.15 mmol/L (ref 1.13–1.30)
GLUCOSE: 105 mg/dL — AB (ref 65–99)
HCT: 28 % — ABNORMAL LOW (ref 36.0–46.0)
Hemoglobin: 9.5 g/dL — ABNORMAL LOW (ref 12.0–15.0)
POTASSIUM: 4.9 mmol/L (ref 3.5–5.1)
Sodium: 134 mmol/L — ABNORMAL LOW (ref 135–145)
TCO2: 26 mmol/L (ref 0–100)

## 2015-10-24 NOTE — ED Notes (Signed)
Patient here for dialysis. States last treatment was Saturday. Denies shortness of breath or pain at triage.

## 2015-10-24 NOTE — ED Notes (Signed)
Patient given cup of ice

## 2015-10-24 NOTE — Discharge Instructions (Signed)
Chronic Kidney Disease Chronic kidney disease happens when the kidneys are damaged over a long period. The kidneys are two organs that do many important jobs in the body. These jobs include:  Removing wastes and extra fluids from the blood.  Making hormones that help to keep the body healthy.  Making sure that the body has the right amount of fluids and chemicals. Chronic kidney disease may be caused by many things. The kidney damage occurs slowly. If too much damage occurs, the kidneys may stop working the way that they should. This is dangerous. Treatment can help to slow down the damage and keep it from getting worse. HOME CARE  Follow your diet as told by your doctor. You may need to limit the amount of salt (sodium) and protein that you eat each day.  Take medicines only as told by your doctor. Do not take any new medicines unless your doctor approves it.  Quit smoking if you smoke. Talk to your doctor about programs that may help you quit smoking.  Have your blood pressure checked regularly and keep track of the results.  Start or keep doing an exercise plan.  Get shots (immunizations) as told by your doctor.  Take vitamins and minerals as told by your doctor.  Keep all follow-up visits as told by your doctor. This is important. GET HELP RIGHT AWAY IF:   Your symptoms get worse.  You have new symptoms.  You have symptoms of end-stage kidney disease. These include:  Headaches.  Skin that is darker or lighter than normal.  Numbness in the hands or feet.  Easy bruising.  Frequent hiccups.  Stopping of menstrual periods in women.  You have a fever.  You are making very little pee (urine).  You have pain or bleeding when you pee.   This information is not intended to replace advice given to you by your health care provider. Make sure you discuss any questions you have with your health care provider.   Document Released: 06/18/2009 Document Revised: 12/13/2014  Document Reviewed: 11/21/2011 Elsevier Interactive Patient Education 2016 Elsevier Inc.  

## 2015-10-24 NOTE — ED Notes (Signed)
Provider at bedside

## 2015-10-24 NOTE — ED Notes (Signed)
Pt given cereal and milk for breakfast
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
## 2015-10-24 NOTE — ED Notes (Signed)
EDP notified of BP 151/112. Pt reports she took her BP medication at 0930

## 2015-10-26 ENCOUNTER — Observation Stay (HOSPITAL_COMMUNITY)
Admission: EM | Admit: 2015-10-26 | Discharge: 2015-10-26 | Disposition: A | Discharge status: Home / Self Care | Payer: Medicare Other | Attending: Internal Medicine | Admitting: Internal Medicine

## 2015-10-26 ENCOUNTER — Encounter (HOSPITAL_COMMUNITY): Payer: Self-pay | Admitting: *Deleted

## 2015-10-26 DIAGNOSIS — I132 Hypertensive heart and chronic kidney disease with heart failure and with stage 5 chronic kidney disease, or end stage renal disease: Secondary | ICD-10-CM | POA: Insufficient documentation

## 2015-10-26 DIAGNOSIS — I10 Essential (primary) hypertension: Secondary | ICD-10-CM | POA: Diagnosis present

## 2015-10-26 DIAGNOSIS — F17210 Nicotine dependence, cigarettes, uncomplicated: Secondary | ICD-10-CM | POA: Diagnosis not present

## 2015-10-26 DIAGNOSIS — R109 Unspecified abdominal pain: Secondary | ICD-10-CM | POA: Insufficient documentation

## 2015-10-26 DIAGNOSIS — D631 Anemia in chronic kidney disease: Secondary | ICD-10-CM | POA: Diagnosis present

## 2015-10-26 DIAGNOSIS — I509 Heart failure, unspecified: Secondary | ICD-10-CM | POA: Insufficient documentation

## 2015-10-26 DIAGNOSIS — E8779 Other fluid overload: Secondary | ICD-10-CM

## 2015-10-26 DIAGNOSIS — F250 Schizoaffective disorder, bipolar type: Secondary | ICD-10-CM | POA: Diagnosis not present

## 2015-10-26 DIAGNOSIS — E875 Hyperkalemia: Secondary | ICD-10-CM | POA: Diagnosis not present

## 2015-10-26 DIAGNOSIS — N186 End stage renal disease: Secondary | ICD-10-CM | POA: Diagnosis present

## 2015-10-26 DIAGNOSIS — Z79899 Other long term (current) drug therapy: Secondary | ICD-10-CM | POA: Insufficient documentation

## 2015-10-26 DIAGNOSIS — R600 Localized edema: Principal | ICD-10-CM

## 2015-10-26 DIAGNOSIS — E8770 Fluid overload, unspecified: Secondary | ICD-10-CM | POA: Diagnosis present

## 2015-10-26 DIAGNOSIS — Z992 Dependence on renal dialysis: Secondary | ICD-10-CM | POA: Diagnosis not present

## 2015-10-26 LAB — I-STAT CHEM 8, ED
BUN: 82 mg/dL — ABNORMAL HIGH (ref 6–20)
CALCIUM ION: 1.11 mmol/L — AB (ref 1.13–1.30)
CREATININE: 12.5 mg/dL — AB (ref 0.44–1.00)
Chloride: 99 mmol/L — ABNORMAL LOW (ref 101–111)
GLUCOSE: 136 mg/dL — AB (ref 65–99)
HCT: 27 % — ABNORMAL LOW (ref 36.0–46.0)
HEMOGLOBIN: 9.2 g/dL — AB (ref 12.0–15.0)
POTASSIUM: 5.6 mmol/L — AB (ref 3.5–5.1)
Sodium: 134 mmol/L — ABNORMAL LOW (ref 135–145)
TCO2: 23 mmol/L (ref 0–100)

## 2015-10-26 LAB — RENAL FUNCTION PANEL
ALBUMIN: 3 g/dL — AB (ref 3.5–5.0)
ANION GAP: 14 (ref 5–15)
Albumin: 2.9 g/dL — ABNORMAL LOW (ref 3.5–5.0)
Anion gap: 10 (ref 5–15)
BUN: 91 mg/dL — ABNORMAL HIGH (ref 6–20)
BUN: 41 mg/dL — AB (ref 6–20)
CALCIUM: 8.9 mg/dL (ref 8.9–10.3)
CO2: 22 mmol/L (ref 22–32)
CO2: 29 mmol/L (ref 22–32)
CREATININE: 6.44 mg/dL — AB (ref 0.44–1.00)
Calcium: 9.1 mg/dL (ref 8.9–10.3)
Chloride: 98 mmol/L — ABNORMAL LOW (ref 101–111)
Chloride: 97 mmol/L — ABNORMAL LOW (ref 101–111)
Creatinine, Ser: 12.89 mg/dL — ABNORMAL HIGH (ref 0.44–1.00)
GFR calc Af Amer: 9 mL/min — ABNORMAL LOW (ref >60)
GFR calc non Af Amer: 3 mL/min — ABNORMAL LOW (ref >60)
GFR, EST AFRICAN AMERICAN: 4 mL/min — AB (ref >60)
GFR, EST NON AFRICAN AMERICAN: 8 mL/min — AB (ref >60)
Glucose, Bld: 94 mg/dL (ref 65–99)
Glucose, Bld: 106 mg/dL — ABNORMAL HIGH (ref 65–99)
PHOSPHORUS: 6.3 mg/dL — AB (ref 2.5–4.6)
POTASSIUM: 4.2 mmol/L (ref 3.5–5.1)
Phosphorus: 11 mg/dL — ABNORMAL HIGH (ref 2.5–4.6)
Potassium: 6.3 mmol/L (ref 3.5–5.1)
SODIUM: 134 mmol/L — AB (ref 135–145)
Sodium: 136 mmol/L (ref 135–145)

## 2015-10-26 LAB — POTASSIUM: POTASSIUM: 4.2 mmol/L (ref 3.5–5.1)

## 2015-10-26 LAB — CREATININE, SERUM
CREATININE: 12.66 mg/dL — AB (ref 0.44–1.00)
GFR, EST AFRICAN AMERICAN: 4 mL/min — AB (ref >60)
GFR, EST NON AFRICAN AMERICAN: 3 mL/min — AB (ref >60)

## 2015-10-26 LAB — CBC
HEMATOCRIT: 24.4 % — AB (ref 36.0–46.0)
HEMOGLOBIN: 8.2 g/dL — AB (ref 12.0–15.0)
MCH: 28.8 pg (ref 26.0–34.0)
MCHC: 33.6 g/dL (ref 30.0–36.0)
MCV: 85.6 fL (ref 78.0–100.0)
Platelets: 179 10*3/uL (ref 150–400)
RBC: 2.85 MIL/uL — AB (ref 3.87–5.11)
RDW: 16.4 % — ABNORMAL HIGH (ref 11.5–15.5)
WBC: 7.3 10*3/uL (ref 4.0–10.5)

## 2015-10-26 MED ORDER — PREDNISONE 20 MG PO TABS: 20.0000 mg | ORAL_TABLET | Freq: Every day | ORAL | Status: DC

## 2015-10-26 MED ORDER — DIPHENHYDRAMINE HCL 25 MG PO CAPS
25.0000 mg | ORAL_CAPSULE | Freq: Four times a day (QID) | ORAL | Status: DC | PRN
  Administered 2015-10-26: 25 mg via ORAL

## 2015-10-26 MED ORDER — SODIUM CHLORIDE 0.9 % IV SOLN: 250.0000 mL | INTRAVENOUS | Status: DC | PRN

## 2015-10-26 MED ORDER — SODIUM CHLORIDE 0.9% FLUSH: 3.0000 mL | Freq: Two times a day (BID) | INTRAVENOUS | Status: DC

## 2015-10-26 MED ORDER — ONDANSETRON HCL 4 MG/2ML IJ SOLN: 4.0000 mg | Freq: Four times a day (QID) | INTRAMUSCULAR | Status: DC | PRN

## 2015-10-26 MED ORDER — ACETAMINOPHEN 325 MG PO TABS: 650.0000 mg | ORAL_TABLET | Freq: Four times a day (QID) | ORAL | Status: DC | PRN

## 2015-10-26 MED ORDER — HEPARIN SODIUM (PORCINE) 1000 UNIT/ML IJ SOLN
INTRAMUSCULAR | Status: AC
  Administered 2015-10-26: 1800 [IU] via INTRAVENOUS_CENTRAL
  Filled 2015-10-26: qty 5

## 2015-10-26 MED ORDER — HEPARIN SODIUM (PORCINE) 5000 UNIT/ML IJ SOLN
5000.0000 [IU] | Freq: Three times a day (TID) | INTRAMUSCULAR | Status: DC
  Filled 2015-10-26: qty 1

## 2015-10-26 MED ORDER — SODIUM CHLORIDE 0.9 % IV SOLN: 100.0000 mL | INTRAVENOUS | Status: DC | PRN

## 2015-10-26 MED ORDER — METOPROLOL TARTRATE 50 MG PO TABS
50.0000 mg | ORAL_TABLET | Freq: Two times a day (BID) | ORAL | Status: DC
  Administered 2015-10-26: 50 mg via ORAL
  Filled 2015-10-26: qty 1

## 2015-10-26 MED ORDER — LIDOCAINE HCL (PF) 1 % IJ SOLN
INTRAMUSCULAR | Status: AC
  Administered 2015-10-26: 0.5 mL via INTRADERMAL
  Filled 2015-10-26: qty 5

## 2015-10-26 MED ORDER — LIDOCAINE HCL (PF) 1 % IJ SOLN
5.0000 mL | INTRAMUSCULAR | Status: DC | PRN
  Administered 2015-10-26: 0.5 mL via INTRADERMAL

## 2015-10-26 MED ORDER — SEVELAMER CARBONATE 800 MG PO TABS
ORAL_TABLET | ORAL | Status: AC
  Administered 2015-10-26: 3200 mg via ORAL
  Filled 2015-10-26: qty 4

## 2015-10-26 MED ORDER — ONDANSETRON HCL 4 MG PO TABS: 4.0000 mg | ORAL_TABLET | Freq: Four times a day (QID) | ORAL | Status: DC | PRN

## 2015-10-26 MED ORDER — SEVELAMER CARBONATE 800 MG PO TABS
3200.0000 mg | ORAL_TABLET | Freq: Three times a day (TID) | ORAL | Status: DC
  Administered 2015-10-26 (×2): 3200 mg via ORAL
  Filled 2015-10-26: qty 4

## 2015-10-26 MED ORDER — PENTAFLUOROPROP-TETRAFLUOROETH EX AERO
1.0000 | INHALATION_SPRAY | CUTANEOUS | Status: DC | PRN
Start: 2015-10-26 — End: 2015-10-26

## 2015-10-26 MED ORDER — ALBUTEROL SULFATE (2.5 MG/3ML) 0.083% IN NEBU: 2.5000 mg | INHALATION_SOLUTION | RESPIRATORY_TRACT | Status: DC | PRN

## 2015-10-26 MED ORDER — EPOETIN ALFA 20000 UNIT/ML IJ SOLN
INTRAMUSCULAR | Status: AC
  Administered 2015-10-26: 14000 [IU] via INTRAVENOUS
  Filled 2015-10-26: qty 1

## 2015-10-26 MED ORDER — SODIUM CHLORIDE 0.9% FLUSH: 3.0000 mL | INTRAVENOUS | Status: DC | PRN

## 2015-10-26 MED ORDER — TRAMADOL HCL 50 MG PO TABS
50.0000 mg | ORAL_TABLET | Freq: Four times a day (QID) | ORAL | Status: DC | PRN
  Administered 2015-10-26: 50 mg via ORAL

## 2015-10-26 MED ORDER — FUROSEMIDE 80 MG PO TABS
120.0000 mg | ORAL_TABLET | Freq: Every day | ORAL | Status: DC
  Administered 2015-10-26: 120 mg via ORAL
  Filled 2015-10-26: qty 1

## 2015-10-26 MED ORDER — EPOETIN ALFA 20000 UNIT/ML IJ SOLN
14000.0000 [IU] | INTRAMUSCULAR | Status: DC
  Filled 2015-10-26 (×2): qty 1

## 2015-10-26 MED ORDER — ACETAMINOPHEN 650 MG RE SUPP: 650.0000 mg | Freq: Four times a day (QID) | RECTAL | Status: DC | PRN

## 2015-10-26 MED ORDER — TRAMADOL HCL 50 MG PO TABS
ORAL_TABLET | ORAL | Status: AC
  Administered 2015-10-26: 50 mg via ORAL
  Filled 2015-10-26: qty 1

## 2015-10-26 MED ORDER — HEPARIN SODIUM (PORCINE) 1000 UNIT/ML DIALYSIS
20.0000 [IU]/kg | INTRAMUSCULAR | Status: DC | PRN
  Administered 2015-10-26: 1800 [IU] via INTRAVENOUS_CENTRAL
  Filled 2015-10-26: qty 2

## 2015-10-26 MED ORDER — RISPERIDONE MICROSPHERES 25 MG IM SUSR: 25.0000 mg | INTRAMUSCULAR | Status: DC

## 2015-10-26 MED ORDER — DIPHENHYDRAMINE HCL 25 MG PO CAPS
ORAL_CAPSULE | ORAL | Status: AC
Start: 2015-10-26 — End: 2015-10-26
  Administered 2015-10-26: 25 mg via ORAL
  Filled 2015-10-26: qty 1

## 2015-10-26 MED ORDER — LIDOCAINE-PRILOCAINE 2.5-2.5 % EX CREA: 1.0000 "application " | TOPICAL_CREAM | CUTANEOUS | Status: DC | PRN

## 2015-10-26 MED ORDER — EPOETIN ALFA 20000 UNIT/ML IJ SOLN
14000.0000 [IU] | INTRAMUSCULAR | Status: DC
  Administered 2015-10-26: 14000 [IU] via INTRAVENOUS
  Filled 2015-10-26 (×2): qty 1
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
## 2015-10-26 NOTE — Procedures (Signed)
   HEMODIALYSIS TREATMENT NOTE:  4.5 hour low-heparin dialysis completed via right upper arm AVF (15g ante/retrograde). Goal met: 4.1 liters removed without interruption in ultrafiltration. All blood was returned and hemostasis was achieved within 10 minutes.  Hyperkalemia prior to HD (K 6.3). Pt agrees to have K re-checked one hour post dialysis.  Report given to Charlynn Court, RN.  Rockwell Alexandria, RN, CDN
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
## 2015-10-26 NOTE — ED Notes (Signed)
Pt comes in for dialysis. Pt last had dialysis done on Saturday. Denies any shortness of breath or dependent swelling.

## 2015-10-26 NOTE — Discharge Summary (Signed)
Patient left AMA against Dr. Request that she stay to confirm potassium levels.  STAT labs had not resulted to confirm levels.  Patient did sign AMA form.
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
## 2015-10-26 NOTE — Discharge Planning (Signed)
STAT potassium labs drawn after patient dialysis.  Patient informed that it could be dangerous to leave against Dr. Kayleen Memos since potassium levels may still be high, indicating a possible heart block.  Dr. Informed of patient's deadline to catch bus at [facility].  Dr. Milus Mallick she would attempt to talk with patient encouraging her to not leave.  Patient insisting she has to catch her ride.

## 2015-10-26 NOTE — Plan of Care (Signed)
Patient refusing to allow IV placement

## 2015-10-26 NOTE — Plan of Care (Addendum)
Dr. Ree Kida of critical lab value: Potassium 6.3. Dialysis RN and Nephro contacted per Dr. Request.   Received confirmation that labs were drawn prior to start of dialysis.

## 2015-10-29 ENCOUNTER — Observation Stay (HOSPITAL_COMMUNITY)
Admission: EM | Admit: 2015-10-29 | Discharge: 2015-10-29 | Disposition: A | Discharge status: Home / Self Care | Payer: Medicare Other | Attending: Internal Medicine | Admitting: Internal Medicine

## 2015-10-29 ENCOUNTER — Observation Stay (HOSPITAL_COMMUNITY): Payer: Medicare Other

## 2015-10-29 ENCOUNTER — Encounter (HOSPITAL_COMMUNITY): Payer: Self-pay | Admitting: Emergency Medicine

## 2015-10-29 DIAGNOSIS — Z79899 Other long term (current) drug therapy: Secondary | ICD-10-CM | POA: Insufficient documentation

## 2015-10-29 DIAGNOSIS — E8779 Other fluid overload: Secondary | ICD-10-CM

## 2015-10-29 DIAGNOSIS — I509 Heart failure, unspecified: Secondary | ICD-10-CM | POA: Insufficient documentation

## 2015-10-29 DIAGNOSIS — N186 End stage renal disease: Secondary | ICD-10-CM | POA: Diagnosis not present

## 2015-10-29 DIAGNOSIS — E875 Hyperkalemia: Secondary | ICD-10-CM | POA: Diagnosis not present

## 2015-10-29 DIAGNOSIS — I132 Hypertensive heart and chronic kidney disease with heart failure and with stage 5 chronic kidney disease, or end stage renal disease: Principal | ICD-10-CM | POA: Insufficient documentation

## 2015-10-29 DIAGNOSIS — R0602 Shortness of breath: Secondary | ICD-10-CM | POA: Diagnosis not present

## 2015-10-29 DIAGNOSIS — E8770 Fluid overload, unspecified: Secondary | ICD-10-CM | POA: Diagnosis not present

## 2015-10-29 DIAGNOSIS — F17210 Nicotine dependence, cigarettes, uncomplicated: Secondary | ICD-10-CM | POA: Insufficient documentation

## 2015-10-29 DIAGNOSIS — I120 Hypertensive chronic kidney disease with stage 5 chronic kidney disease or end stage renal disease: Secondary | ICD-10-CM | POA: Diagnosis not present

## 2015-10-29 DIAGNOSIS — D631 Anemia in chronic kidney disease: Secondary | ICD-10-CM | POA: Diagnosis not present

## 2015-10-29 DIAGNOSIS — Z992 Dependence on renal dialysis: Secondary | ICD-10-CM | POA: Insufficient documentation

## 2015-10-29 DIAGNOSIS — F250 Schizoaffective disorder, bipolar type: Secondary | ICD-10-CM | POA: Diagnosis present

## 2015-10-29 LAB — RENAL FUNCTION PANEL
ALBUMIN: 2.9 g/dL — AB (ref 3.5–5.0)
Anion gap: 14 (ref 5–15)
BUN: 82 mg/dL — AB (ref 6–20)
CO2: 23 mmol/L (ref 22–32)
Calcium: 8.6 mg/dL — ABNORMAL LOW (ref 8.9–10.3)
Chloride: 98 mmol/L — ABNORMAL LOW (ref 101–111)
Creatinine, Ser: 10.98 mg/dL — ABNORMAL HIGH (ref 0.44–1.00)
GFR calc Af Amer: 5 mL/min — ABNORMAL LOW (ref >60)
GFR calc non Af Amer: 4 mL/min — ABNORMAL LOW (ref >60)
GLUCOSE: 160 mg/dL — AB (ref 65–99)
PHOSPHORUS: 9.4 mg/dL — AB (ref 2.5–4.6)
POTASSIUM: 5.9 mmol/L — AB (ref 3.5–5.1)
Sodium: 135 mmol/L (ref 135–145)

## 2015-10-29 LAB — I-STAT CHEM 8, ED
BUN: 71 mg/dL — ABNORMAL HIGH (ref 6–20)
CALCIUM ION: 1.08 mmol/L — AB (ref 1.13–1.30)
CREATININE: 10.6 mg/dL — AB (ref 0.44–1.00)
Chloride: 100 mmol/L — ABNORMAL LOW (ref 101–111)
GLUCOSE: 107 mg/dL — AB (ref 65–99)
HCT: 29 % — ABNORMAL LOW (ref 36.0–46.0)
HEMOGLOBIN: 9.9 g/dL — AB (ref 12.0–15.0)
POTASSIUM: 5.7 mmol/L — AB (ref 3.5–5.1)
Sodium: 135 mmol/L (ref 135–145)
TCO2: 26 mmol/L (ref 0–100)

## 2015-10-29 LAB — CBC
HEMATOCRIT: 25.3 % — AB (ref 36.0–46.0)
Hemoglobin: 8.2 g/dL — ABNORMAL LOW (ref 12.0–15.0)
MCH: 28.2 pg (ref 26.0–34.0)
MCHC: 32.4 g/dL (ref 30.0–36.0)
MCV: 86.9 fL (ref 78.0–100.0)
Platelets: 220 10*3/uL (ref 150–400)
RBC: 2.91 MIL/uL — ABNORMAL LOW (ref 3.87–5.11)
RDW: 16.7 % — AB (ref 11.5–15.5)
WBC: 8.5 10*3/uL (ref 4.0–10.5)

## 2015-10-29 LAB — MRSA PCR SCREENING: MRSA by PCR: NEGATIVE

## 2015-10-29 MED ORDER — DOCUSATE SODIUM 100 MG PO CAPS
100.0000 mg | ORAL_CAPSULE | Freq: Two times a day (BID) | ORAL | Status: DC
  Administered 2015-10-29: 100 mg via ORAL
  Filled 2015-10-29: qty 1

## 2015-10-29 MED ORDER — DIPHENHYDRAMINE HCL 25 MG PO CAPS
ORAL_CAPSULE | ORAL | Status: AC
  Administered 2015-10-29: 25 mg via ORAL
  Filled 2015-10-29: qty 1

## 2015-10-29 MED ORDER — ALBUTEROL SULFATE (2.5 MG/3ML) 0.083% IN NEBU: 2.5000 mg | INHALATION_SOLUTION | RESPIRATORY_TRACT | Status: DC | PRN

## 2015-10-29 MED ORDER — FUROSEMIDE 80 MG PO TABS
120.0000 mg | ORAL_TABLET | Freq: Every day | ORAL | Status: DC
  Administered 2015-10-29: 120 mg via ORAL
  Filled 2015-10-29: qty 1

## 2015-10-29 MED ORDER — ACETAMINOPHEN 500 MG PO TABS
1000.0000 mg | ORAL_TABLET | Freq: Four times a day (QID) | ORAL | Status: DC | PRN
  Administered 2015-10-29: 1000 mg via ORAL
  Filled 2015-10-29: qty 2

## 2015-10-29 MED ORDER — LIDOCAINE HCL (PF) 1 % IJ SOLN
INTRAMUSCULAR | Status: AC
  Administered 2015-10-29: 0.5 mL via INTRADERMAL
  Filled 2015-10-29: qty 5

## 2015-10-29 MED ORDER — ALTEPLASE 2 MG IJ SOLR: 2.0000 mg | Freq: Once | INTRAMUSCULAR | Status: DC | PRN

## 2015-10-29 MED ORDER — HEPARIN SODIUM (PORCINE) 5000 UNIT/ML IJ SOLN
5000.0000 [IU] | Freq: Three times a day (TID) | INTRAMUSCULAR | Status: DC
  Filled 2015-10-29: qty 1

## 2015-10-29 MED ORDER — ONDANSETRON HCL 4 MG PO TABS: 4.0000 mg | ORAL_TABLET | Freq: Four times a day (QID) | ORAL | Status: DC | PRN

## 2015-10-29 MED ORDER — PENTAFLUOROPROP-TETRAFLUOROETH EX AERO: 1.0000 "application " | INHALATION_SPRAY | CUTANEOUS | Status: DC | PRN

## 2015-10-29 MED ORDER — LIDOCAINE-PRILOCAINE 2.5-2.5 % EX CREA: 1.0000 "application " | TOPICAL_CREAM | CUTANEOUS | Status: DC | PRN

## 2015-10-29 MED ORDER — LIDOCAINE HCL (PF) 1 % IJ SOLN
5.0000 mL | INTRAMUSCULAR | Status: DC | PRN
  Administered 2015-10-29: 0.5 mL via INTRADERMAL

## 2015-10-29 MED ORDER — SODIUM CHLORIDE 0.9 % IV SOLN: 100.0000 mL | INTRAVENOUS | Status: DC | PRN

## 2015-10-29 MED ORDER — PREDNISONE 20 MG PO TABS: 20.0000 mg | ORAL_TABLET | Freq: Every day | ORAL | 6 refills | Status: DC

## 2015-10-29 MED ORDER — EPOETIN ALFA 10000 UNIT/ML IJ SOLN
INTRAMUSCULAR | Status: AC
  Administered 2015-10-29: 10000 [IU] via INTRAVENOUS
  Filled 2015-10-29: qty 1

## 2015-10-29 MED ORDER — HEPARIN SODIUM (PORCINE) 1000 UNIT/ML IJ SOLN
INTRAMUSCULAR | Status: AC
  Administered 2015-10-29: 1500 [IU] via INTRAVENOUS_CENTRAL
  Filled 2015-10-29: qty 5

## 2015-10-29 MED ORDER — SEVELAMER CARBONATE 800 MG PO TABS: 3200.0000 mg | ORAL_TABLET | Freq: Three times a day (TID) | ORAL | 6 refills | Status: AC

## 2015-10-29 MED ORDER — EPOETIN ALFA 40000 UNIT/ML IJ SOLN: 30000.0000 [IU] | INTRAMUSCULAR | Status: DC

## 2015-10-29 MED ORDER — PREDNISONE 20 MG PO TABS
20.0000 mg | ORAL_TABLET | Freq: Every day | ORAL | Status: DC
Start: 2015-10-30 — End: 2015-10-29

## 2015-10-29 MED ORDER — SEVELAMER CARBONATE 800 MG PO TABS
3200.0000 mg | ORAL_TABLET | Freq: Three times a day (TID) | ORAL | Status: DC
  Administered 2015-10-29: 3200 mg via ORAL
  Filled 2015-10-29: qty 4

## 2015-10-29 MED ORDER — HEPARIN SODIUM (PORCINE) 1000 UNIT/ML DIALYSIS: 1000.0000 [IU] | INTRAMUSCULAR | Status: DC | PRN

## 2015-10-29 MED ORDER — SODIUM CHLORIDE 0.9% FLUSH: 3.0000 mL | Freq: Two times a day (BID) | INTRAVENOUS | Status: DC

## 2015-10-29 MED ORDER — METOPROLOL TARTRATE 50 MG PO TABS
50.0000 mg | ORAL_TABLET | Freq: Two times a day (BID) | ORAL | Status: DC
  Administered 2015-10-29: 50 mg via ORAL
  Filled 2015-10-29: qty 1

## 2015-10-29 MED ORDER — EPOETIN ALFA NICU SYRINGE 2000 UNITS/ML: 400.0000 [IU]/kg | INTRAMUSCULAR | Status: DC

## 2015-10-29 MED ORDER — TRAMADOL HCL 50 MG PO TABS: 50.0000 mg | ORAL_TABLET | Freq: Four times a day (QID) | ORAL | 0 refills | Status: DC | PRN

## 2015-10-29 MED ORDER — EPOETIN ALFA 10000 UNIT/ML IJ SOLN
10000.0000 [IU] | Freq: Once | INTRAMUSCULAR | Status: AC
  Administered 2015-10-29: 10000 [IU] via INTRAVENOUS

## 2015-10-29 MED ORDER — DIPHENHYDRAMINE HCL 25 MG PO CAPS
25.0000 mg | ORAL_CAPSULE | Freq: Four times a day (QID) | ORAL | Status: DC | PRN
  Administered 2015-10-29: 25 mg via ORAL

## 2015-10-29 MED ORDER — HEPARIN SODIUM (PORCINE) 1000 UNIT/ML DIALYSIS
20.0000 [IU]/kg | INTRAMUSCULAR | Status: DC | PRN
  Administered 2015-10-29: 1500 [IU] via INTRAVENOUS_CENTRAL

## 2015-10-29 MED ORDER — RISPERIDONE MICROSPHERES 25 MG IM SUSR: 25.0000 mg | INTRAMUSCULAR | Status: DC

## 2015-10-29 MED ORDER — ONDANSETRON HCL 4 MG/2ML IJ SOLN: 4.0000 mg | Freq: Four times a day (QID) | INTRAMUSCULAR | Status: DC | PRN

## 2015-10-29 NOTE — ED Triage Notes (Signed)
Pt reports was last dialyzedo n Friday. Pt reprots " I need dialysis." pt reports back pain and bilateral knee pain. Moderate swelling noted to BLE. nad noted.

## 2015-10-29 NOTE — Progress Notes (Signed)
Discharge instructions and prescriptions given, verbalized understanding, out in stable condition via w/c with staff. 

## 2015-10-29 NOTE — Progress Notes (Signed)
Patient refuses to wear telemetry monitor as ordered.
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
## 2015-10-29 NOTE — Procedures (Signed)
   HEMODIALYSIS TREATMENT NOTE:  4 hour low-heparin dialysis completed via right upper arm AVF (15g ante/retrograde). Goal nearly met: 3.9 liters removed. Pt terminated session early/AMA with 30 minutes remaining to meet a friend for dinner.  Dr. Lowanda Foster was notified. All blood was returned and hemostasis was achieved within 15 minutes. Report given to Janan Ridge, RN.  Rockwell Alexandria, RN, CDN
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
## 2015-10-31 ENCOUNTER — Observation Stay (HOSPITAL_COMMUNITY)
Admission: EM | Admit: 2015-10-31 | Discharge: 2015-10-31 | Disposition: A | Discharge status: Home / Self Care | Payer: Medicare Other | Attending: Internal Medicine | Admitting: Internal Medicine

## 2015-10-31 ENCOUNTER — Encounter (HOSPITAL_COMMUNITY): Payer: Self-pay | Admitting: Cardiology

## 2015-10-31 DIAGNOSIS — I120 Hypertensive chronic kidney disease with stage 5 chronic kidney disease or end stage renal disease: Secondary | ICD-10-CM | POA: Diagnosis not present

## 2015-10-31 DIAGNOSIS — Z79899 Other long term (current) drug therapy: Secondary | ICD-10-CM | POA: Diagnosis not present

## 2015-10-31 DIAGNOSIS — Z992 Dependence on renal dialysis: Secondary | ICD-10-CM | POA: Insufficient documentation

## 2015-10-31 DIAGNOSIS — F209 Schizophrenia, unspecified: Secondary | ICD-10-CM | POA: Diagnosis not present

## 2015-10-31 DIAGNOSIS — E875 Hyperkalemia: Secondary | ICD-10-CM | POA: Diagnosis not present

## 2015-10-31 DIAGNOSIS — R0602 Shortness of breath: Secondary | ICD-10-CM | POA: Diagnosis present

## 2015-10-31 DIAGNOSIS — N189 Chronic kidney disease, unspecified: Secondary | ICD-10-CM | POA: Diagnosis not present

## 2015-10-31 DIAGNOSIS — I1311 Hypertensive heart and chronic kidney disease without heart failure, with stage 5 chronic kidney disease, or end stage renal disease: Principal | ICD-10-CM | POA: Insufficient documentation

## 2015-10-31 DIAGNOSIS — D631 Anemia in chronic kidney disease: Secondary | ICD-10-CM

## 2015-10-31 DIAGNOSIS — F319 Bipolar disorder, unspecified: Secondary | ICD-10-CM | POA: Insufficient documentation

## 2015-10-31 DIAGNOSIS — I10 Essential (primary) hypertension: Secondary | ICD-10-CM

## 2015-10-31 DIAGNOSIS — N186 End stage renal disease: Secondary | ICD-10-CM | POA: Diagnosis not present

## 2015-10-31 DIAGNOSIS — I509 Heart failure, unspecified: Secondary | ICD-10-CM | POA: Insufficient documentation

## 2015-10-31 LAB — RENAL FUNCTION PANEL
Albumin: 2.9 g/dL — ABNORMAL LOW (ref 3.5–5.0)
Anion gap: 11 (ref 5–15)
BUN: 58 mg/dL — ABNORMAL HIGH (ref 6–20)
CO2: 26 mmol/L (ref 22–32)
Calcium: 8.7 mg/dL — ABNORMAL LOW (ref 8.9–10.3)
Chloride: 99 mmol/L — ABNORMAL LOW (ref 101–111)
Creatinine, Ser: 8.89 mg/dL — ABNORMAL HIGH (ref 0.44–1.00)
GFR calc Af Amer: 6 mL/min — ABNORMAL LOW (ref >60)
GFR calc non Af Amer: 5 mL/min — ABNORMAL LOW (ref >60)
Glucose, Bld: 115 mg/dL — ABNORMAL HIGH (ref 65–99)
Phosphorus: 8 mg/dL — ABNORMAL HIGH (ref 2.5–4.6)
Potassium: 4.4 mmol/L (ref 3.5–5.1)
Sodium: 136 mmol/L (ref 135–145)

## 2015-10-31 LAB — CBC
HCT: 25.6 % — ABNORMAL LOW (ref 36.0–46.0)
Hemoglobin: 8.3 g/dL — ABNORMAL LOW (ref 12.0–15.0)
MCH: 28.4 pg (ref 26.0–34.0)
MCHC: 32.4 g/dL (ref 30.0–36.0)
MCV: 87.7 fL (ref 78.0–100.0)
Platelets: 220 10*3/uL (ref 150–400)
RBC: 2.92 MIL/uL — ABNORMAL LOW (ref 3.87–5.11)
RDW: 16.9 % — ABNORMAL HIGH (ref 11.5–15.5)
WBC: 8.6 10*3/uL (ref 4.0–10.5)

## 2015-10-31 MED ORDER — SODIUM CHLORIDE 0.9 % IV SOLN: 100.0000 mL | INTRAVENOUS | Status: DC | PRN

## 2015-10-31 MED ORDER — LIDOCAINE-PRILOCAINE 2.5-2.5 % EX CREA: 1.0000 "application " | TOPICAL_CREAM | CUTANEOUS | Status: DC | PRN

## 2015-10-31 MED ORDER — TRAMADOL HCL 50 MG PO TABS: 50.0000 mg | ORAL_TABLET | Freq: Four times a day (QID) | ORAL | Status: DC | PRN

## 2015-10-31 MED ORDER — HEPARIN SODIUM (PORCINE) 1000 UNIT/ML DIALYSIS
20.0000 [IU]/kg | INTRAMUSCULAR | Status: DC | PRN
  Administered 2015-10-31: 1800 [IU] via INTRAVENOUS_CENTRAL

## 2015-10-31 MED ORDER — TRAMADOL HCL 50 MG PO TABS
ORAL_TABLET | ORAL | Status: AC
  Administered 2015-10-31: 50 mg via ORAL
  Filled 2015-10-31: qty 1

## 2015-10-31 MED ORDER — HEPARIN SODIUM (PORCINE) 1000 UNIT/ML IJ SOLN
INTRAMUSCULAR | Status: AC
  Administered 2015-10-31: 1800 [IU] via INTRAVENOUS_CENTRAL
  Filled 2015-10-31: qty 4

## 2015-10-31 MED ORDER — EPOETIN ALFA 10000 UNIT/ML IJ SOLN: 8000.0000 [IU] | INTRAMUSCULAR | Status: DC

## 2015-10-31 MED ORDER — DIPHENHYDRAMINE HCL 25 MG PO CAPS
25.0000 mg | ORAL_CAPSULE | Freq: Four times a day (QID) | ORAL | Status: DC | PRN
  Administered 2015-10-31: 25 mg via ORAL

## 2015-10-31 MED ORDER — SEVELAMER CARBONATE 800 MG PO TABS: 3200.0000 mg | ORAL_TABLET | Freq: Three times a day (TID) | ORAL | Status: DC

## 2015-10-31 MED ORDER — METOPROLOL TARTRATE 50 MG PO TABS: 50.0000 mg | ORAL_TABLET | Freq: Two times a day (BID) | ORAL | Status: DC

## 2015-10-31 MED ORDER — PENTAFLUOROPROP-TETRAFLUOROETH EX AERO: 1.0000 "application " | INHALATION_SPRAY | CUTANEOUS | Status: DC | PRN

## 2015-10-31 MED ORDER — EPOETIN ALFA 10000 UNIT/ML IJ SOLN
INTRAMUSCULAR | Status: AC
  Administered 2015-10-31: 8000 [IU] via INTRAVENOUS
  Filled 2015-10-31: qty 1

## 2015-10-31 MED ORDER — LIDOCAINE HCL (PF) 1 % IJ SOLN
INTRAMUSCULAR | Status: AC
  Administered 2015-10-31: 0.5 mL via INTRADERMAL
  Filled 2015-10-31: qty 5

## 2015-10-31 MED ORDER — PREDNISONE 20 MG PO TABS: 20.0000 mg | ORAL_TABLET | Freq: Every day | ORAL | Status: DC

## 2015-10-31 MED ORDER — FUROSEMIDE 40 MG PO TABS: 120.0000 mg | ORAL_TABLET | Freq: Every day | ORAL | Status: DC

## 2015-10-31 MED ORDER — DIPHENHYDRAMINE HCL 25 MG PO CAPS
ORAL_CAPSULE | ORAL | Status: AC
  Administered 2015-10-31: 25 mg via ORAL
  Filled 2015-10-31: qty 1

## 2015-10-31 MED ORDER — LIDOCAINE HCL (PF) 1 % IJ SOLN
5.0000 mL | INTRAMUSCULAR | Status: DC | PRN
  Administered 2015-10-31: 0.5 mL via INTRADERMAL

## 2015-10-31 MED ORDER — EPOETIN ALFA 10000 UNIT/ML IJ SOLN
8000.0000 [IU] | INTRAMUSCULAR | Status: DC
  Administered 2015-10-31: 8000 [IU] via INTRAVENOUS

## 2015-10-31 MED ORDER — TRAMADOL HCL 50 MG PO TABS
50.0000 mg | ORAL_TABLET | Freq: Two times a day (BID) | ORAL | Status: DC | PRN
  Administered 2015-10-31: 50 mg via ORAL

## 2015-10-31 NOTE — ED Triage Notes (Signed)
Here for dialysis

## 2015-10-31 NOTE — Progress Notes (Signed)
Pt back to room from dialysis.  Vitals signs stable, no complaints at this time.  Dr notified that pt back to floor.

## 2015-10-31 NOTE — Discharge Summary (Signed)
Physician Discharge Summary  Sara [name] [name] DOB: Sep 29, 1980 DOA: 10/31/2015  PCP: No PCP Per Patient  Admit date: 10/31/2015 Discharge date: 10/31/2015   Recommendations for Outpatient Follow-up:  1. [place] AGAINST MEDICAL ADVICE    Brief/Interim Summary: This is a 35 year old dialysis patient who is well-known to our service. She has not been set up with an outpatient dialysis center and comes today in hospital regularly to obtain dialysis. On this admission, she presented with shortness of breath for the past few days which was attributed to volume overload. She underwent dialysis per nephrology. She felt improved after dialysis. Vitals remained stable and labs were at patient's baseline. After dialysis, patient did not wait to be seen by this physician and left the hospital without informing any staff.  Discharge Diagnoses:  Active Problems:   ESRD on hemodialysis (HCC)   ESRD (end stage renal disease) ([place])   Essential HTN   Anemia in chronic kidney disease    Allergies  Allergen Reactions  . Ativan [Lorazepam] Swelling and Other (See Comments)    Dysarthria(patient has difficulty speaking and slurred speech); denies swelling, itching, pain, or numbness.  [name] [Ziprasidone Hcl] Itching and Swelling    Tongue swelling  . Keflex [Cephalexin] Swelling and Other (See Comments)    Tongue swelling. Can't talk   . Haldol [Haloperidol Lactate] Swelling    Tongue swelling. 05/31/15 - MD ok with giving as pt has tolerated in the past Pt can take benadryl.  . Other Itching    wool    Consultations:  nephrology   Procedures/Studies: Dg Chest 2 View  Result Date: 10/29/2015 CLINICAL DATA:  Shortness of breath.  Chronic renal failure EXAM: CHEST  2 VIEW COMPARISON:  September 28, 2015 FINDINGS: There is no edema or consolidation. There is cardiomegaly with pulmonary vascularity within normal limits. No adenopathy. There is erosive change in the  distal clavicles with extensive bony hypertrophy in these areas, stable. IMPRESSION: Persistent cardiomegaly. No edema or consolidation. Changes in the distal clavicles most likely are due to secondary hyperparathyroidism associated with chronic renal failure. Electronically Signed   By: Lowella Grip III M.D.   On: 10/29/2015 09:56       The results of significant diagnostics from this hospitalization (including imaging, microbiology, ancillary and laboratory) are listed below for reference.     Microbiology: Recent Results (from the past 240 hour(s))  MRSA PCR Screening     Status: None   Collection Time: 10/29/15 12:00 PM  Result Value Ref Range Status   MRSA by PCR NEGATIVE NEGATIVE Final    Comment:        The GeneXpert MRSA Assay (FDA approved for NASAL specimens only), is one component of a comprehensive MRSA colonization surveillance program. It is not intended to diagnose MRSA infection nor to guide or monitor treatment for MRSA infections.      Labs: BNP (last 3 results) No results for input(s): BNP in the last 8760 hours. Basic Metabolic Panel:  Recent Labs Lab 10/26/15 1250 10/26/15 1651 10/29/15 0834 10/29/15 1334 10/31/15 1202  NA 134* 136 135 135 136  K 6.3* 4.2  4.2 5.7* 5.9* 4.4  CL 98* 97* 100* 98* 99*  CO2 22 29  --  23 26  GLUCOSE 94 106* 107* 160* 115*  BUN 91* 41* 71* 82* 58*  CREATININE 12.66*  12.89* 6.44* 10.60* 10.98* 8.89*  CALCIUM 8.9 9.1  --  8.6* 8.7*  PHOS 11.0* 6.3*  --  9.4* 8.0*   Liver Function Tests:  Recent Labs Lab 10/26/15 1250 10/26/15 1651 10/29/15 1334 10/31/15 1202  ALBUMIN 2.9* 3.0* 2.9* 2.9*   No results for input(s): LIPASE, AMYLASE in the last 168 hours. No results for input(s): AMMONIA in the last 168 hours. CBC:  Recent Labs Lab 10/26/15 0814 10/26/15 1250 10/29/15 0834 10/29/15 1334 10/31/15 1201  WBC  --  7.3  --  8.5 8.6  HGB 9.2* 8.2* 9.9* 8.2* 8.3*  HCT 27.0* 24.4* 29.0* 25.3* 25.6*   MCV  --  85.6  --  86.9 87.7  PLT  --  179  --  220 220   Cardiac Enzymes: No results for input(s): CKTOTAL, CKMB, CKMBINDEX, TROPONINI in the last 168 hours. BNP: Invalid input(s): POCBNP CBG: No results for input(s): GLUCAP in the last 168 hours. D-Dimer No results for input(s): DDIMER in the last 72 hours. Hgb A1c No results for input(s): HGBA1C in the last 72 hours. Lipid Profile No results for input(s): CHOL, HDL, LDLCALC, TRIG, CHOLHDL, LDLDIRECT in the last 72 hours. Thyroid function studies No results for input(s): TSH, T4TOTAL, T3FREE, THYROIDAB in the last 72 hours.  Invalid input(s): FREET3 Anemia work up No results for input(s): VITAMINB12, FOLATE, FERRITIN, TIBC, IRON, RETICCTPCT in the last 72 hours. Urinalysis    Component Value Date/Time   COLORURINE YELLOW 08/11/2015 1235   APPEARANCEUR HAZY (A) 08/11/2015 1235   LABSPEC 1.015 08/11/2015 1235   PHURINE 7.5 08/11/2015 1235   GLUCOSEU 100 (A) 08/11/2015 1235   HGBUR SMALL (A) 08/11/2015 1235   BILIRUBINUR NEGATIVE 08/11/2015 1235   KETONESUR NEGATIVE 08/11/2015 1235   PROTEINUR >300 (A) 08/11/2015 1235   UROBILINOGEN 0.2 01/09/2015 1645   NITRITE NEGATIVE 08/11/2015 1235   LEUKOCYTESUR SMALL (A) 08/11/2015 1235   Sepsis Labs Invalid input(s): PROCALCITONIN,  WBC,  LACTICIDVEN Microbiology Recent Results (from the past 240 hour(s))  MRSA PCR Screening     Status: None   Collection Time: 10/29/15 12:00 PM  Result Value Ref Range Status   MRSA by PCR NEGATIVE NEGATIVE Final    Comment:        The GeneXpert MRSA Assay (FDA approved for NASAL specimens only), is one component of a comprehensive MRSA colonization surveillance program. It is not intended to diagnose MRSA infection nor to guide or monitor treatment for MRSA infections.      Time coordinating discharge: 10 minutes  SIGNED:   Kathie Dike, MD  [facility] 10/31/2015, 8:12 PM Pager [phone]  If 7PM-7AM, please contact  night-coverage [email or web address] Password [name]
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
## 2015-10-31 NOTE — Consult Note (Addendum)
Sara [name] MRN: [id number] DOB/AGE: 34/25/1982 33 y.o. Primary Care Physician:No PCP Per Patient Admit date: 10/31/2015 Chief Complaint:  Chief Complaint  Patient presents with  . Shortness of Breath   HPI: Pt is 35 year old female with past medical hx of ESRD who was admitted with c/o dyspnea," I need dialysis "  HPI dates back to few days ago started feeling short of breath,and so pt came to ER asking for her dialysis. Pt says " I need my dialysis .I am full of fluid. I had my dialysis this last Monday ". No c/o fever/cough/chills NO c/o nausea/vomiting/ diarrhea No c/o hematuria Pt was seen  for need of renal replacement therapy. Pt says  She still has not heard from [facility].     Past Medical History:  Diagnosis Date  . Acute myopericarditis    hx/notes 10/09/2014  . Bipolar disorder ([place])    [name] 10/09/2014  . CHF (congestive heart failure) ([place])    systolic/notes [id number]  . Chronic anemia    [name] 10/09/2014  . ESRD (end stage renal disease) on dialysis [facility])    "[name]; [facility]" (10/09/2014)  . History of blood transfusion    "this is probably my 3rd" (10/09/2014)  . Hypertension   . Low back pain   . 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 Non-compliant patient   . Positive ANA (antinuclear antibody) 08/16/2012  . 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 Pregnancy   . Psychosis   . Schizoaffective disorder, bipolar type ([place]) 11/20/2014  . Schizophrenia ([place])    [name] 10/09/2014  . Tobacco abuse 02/20/2014        Family History  Problem Relation Age of Onset  . Drug abuse Father   . Kidney disease Father     Social History:  reports that she has been smoking Cigarettes.  She has a 2.00 pack-year smoking history. She has never used smokeless tobacco. She reports that she does not drink alcohol or use drugs.   Allergies:  Allergies  Allergen Reactions  . Ativan [Lorazepam] Swelling and Other (See Comments)   Dysarthria(patient has difficulty speaking and slurred speech); denies swelling, itching, pain, or numbness.  [name] [Ziprasidone Hcl] Itching and Swelling    Tongue swelling  . Keflex [Cephalexin] Swelling and Other (See Comments)    Tongue swelling. Can't talk   . Haldol [Haloperidol Lactate] Swelling    Tongue swelling. 05/31/15 - MD ok with giving as pt has tolerated in the past Pt can take benadryl.  . Other Itching    wool     (Not in a hospital admission)     [id number] from the symptoms mentioned above,there are no other symptoms referable to all systems reviewed.      Physical Exam: Vital signs in last 24 hours: Temp:  [98 F (36.7 C)-98.7 F (37.1 C)] 98 F (36.7 C) (07/26 0911) Pulse Rate:  [108-109] 108 (07/26 0911) Resp:  [20-24] 20 (07/26 0911) BP: (145-146)/(105-115) 145/115 (07/26 0911) SpO2:  [100 %] 100 % (07/26 0911) Weight:  [193 lb (87.5 kg)] 193 lb (87.5 kg) (07/26 0812) Weight change:     Intake/Output from previous day: No intake/output data recorded. No intake/output data recorded.   Physical Exam: General- pt is awake,alert, follows coomands Resp- No acute REsp distress,  decreased bs at bases. CVS- S1S2 regular in rate and rhythm, NO rubs  GIT- BS+, soft, NT, ND EXT- 2+ LE Edema,  NO Cyanosis CNS- CN 2-12 grossly intact. Moving all 4 extremities Access- AVF+, aneurysm present    Lab Results:  CBC    Component Value Date/Time   WBC 8.5 10/29/2015 1334   RBC 2.91 (L) 10/29/2015 1334   HGB 8.2 (L) 10/29/2015 1334   HCT 25.3 (L) 10/29/2015 1334   PLT 220 10/29/2015 1334   MCV 86.9 10/29/2015 1334   MCH 28.2 10/29/2015 1334   MCHC 32.4 10/29/2015 1334   RDW 16.7 (H) 10/29/2015 1334   LYMPHSABS 1.0 09/21/2015 0841   MONOABS 0.7 09/21/2015 0841   EOSABS 0.1 09/21/2015 0841   BASOSABS 0.0 09/21/2015 0841      BMET  Recent Labs  10/29/15 0834 10/29/15 1334  NA 135 135  K 5.7* 5.9*  CL 100* 98*  CO2  --  23  GLUCOSE  107* 160*  BUN 71* 82*  CREATININE 10.60* 10.98*  CALCIUM  --  8.6*    MICRO Recent Results (from the past 240 hour(s))  MRSA PCR Screening     Status: None   Collection Time: 10/29/15 12:00 PM  Result Value Ref Range Status   MRSA by PCR NEGATIVE NEGATIVE Final    Comment:        The GeneXpert MRSA Assay (FDA approved for NASAL specimens only), is one component of a comprehensive MRSA colonization surveillance program. It is not intended to diagnose MRSA infection nor to guide or monitor treatment for MRSA infections.       Lab Results  Component Value Date   PTH 1,530 (H) 10/15/2015   PTH Comment 10/15/2015   CALCIUM 8.6 (L) 10/29/2015   CAION 1.08 (L) 10/29/2015   PHOS 9.4 (H) 10/29/2015      Impression: 1)Renal  ESRD on HD                Pt is not on regular  Schedule sec to her complaince/adherence issues                Will dialyze pt today  2)HTN Bp is not at goal  3)Anemia In ESRD the goal for HGb is 9--11. Pt HGb is not at goal Will keep  on epo   4)CKD Mineral-Bone Disorder PTH acceptable. Secondary Hyperparathyroidism  Present. Phosphorus not at goal.  on binders  5)Psych .  Hx of   psycosis Schizophrenia Primary MD following  6)Electrolytes  Hyperkalemic  Hyponatremic    Sec to ESRD  7)Acid base Co2  at goal   Plan:  Will dialyze today Will keep  on epo 8000units  Will use 2 k bath Will try to take 2.5-3 liters off  I encouraged pt to please continue to try to set up out pt HD tx  Addendum Pt seen on HD Pt tolerating tx well.  Shakema Surita S 10/31/2015, 9:15 AM

## 2015-10-31 NOTE — Procedures (Signed)
   HEMODIALYSIS TREATMENT NOTE:  4.5 hour low-heparin dialysis completed via right upper arm AVF (15g ante/retrograde). Goal met; 2.5 liters removed without interruption in ultrafiltration. All blood was returned and hemostasis was achieved within 15 minutes. Report called to Tacey Heap, RN.  Rockwell Alexandria, RN, CDN

## 2015-10-31 NOTE — Progress Notes (Signed)
Dr Roderic Palau notified this RN that upon going to room to assess pt, pt had already left.  Pt was aware that Dr wanted to see them prior to discharge, pt left before Dr able to see and did not sign AMA paperwork.
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
## 2015-11-02 ENCOUNTER — Observation Stay (HOSPITAL_COMMUNITY)
Admission: EM | Admit: 2015-11-02 | Discharge: 2015-11-02 | Disposition: A | Discharge status: Home / Self Care | Payer: Medicare Other | Attending: Internal Medicine | Admitting: Internal Medicine

## 2015-11-02 ENCOUNTER — Emergency Department (HOSPITAL_COMMUNITY): Payer: Medicare Other

## 2015-11-02 ENCOUNTER — Encounter (HOSPITAL_COMMUNITY): Payer: Self-pay | Admitting: *Deleted

## 2015-11-02 DIAGNOSIS — I132 Hypertensive heart and chronic kidney disease with heart failure and with stage 5 chronic kidney disease, or end stage renal disease: Principal | ICD-10-CM | POA: Insufficient documentation

## 2015-11-02 DIAGNOSIS — N186 End stage renal disease: Secondary | ICD-10-CM | POA: Diagnosis not present

## 2015-11-02 DIAGNOSIS — L0291 Cutaneous abscess, unspecified: Secondary | ICD-10-CM

## 2015-11-02 DIAGNOSIS — Z79899 Other long term (current) drug therapy: Secondary | ICD-10-CM | POA: Diagnosis not present

## 2015-11-02 DIAGNOSIS — R600 Localized edema: Secondary | ICD-10-CM | POA: Diagnosis not present

## 2015-11-02 DIAGNOSIS — Z992 Dependence on renal dialysis: Secondary | ICD-10-CM | POA: Diagnosis not present

## 2015-11-02 DIAGNOSIS — I509 Heart failure, unspecified: Secondary | ICD-10-CM | POA: Insufficient documentation

## 2015-11-02 DIAGNOSIS — R197 Diarrhea, unspecified: Secondary | ICD-10-CM | POA: Diagnosis not present

## 2015-11-02 DIAGNOSIS — L02414 Cutaneous abscess of left upper limb: Secondary | ICD-10-CM

## 2015-11-02 DIAGNOSIS — I120 Hypertensive chronic kidney disease with stage 5 chronic kidney disease or end stage renal disease: Secondary | ICD-10-CM | POA: Diagnosis not present

## 2015-11-02 DIAGNOSIS — F17210 Nicotine dependence, cigarettes, uncomplicated: Secondary | ICD-10-CM | POA: Insufficient documentation

## 2015-11-02 LAB — RENAL FUNCTION PANEL
ALBUMIN: 3.2 g/dL — AB (ref 3.5–5.0)
ANION GAP: 12 (ref 5–15)
BUN: 47 mg/dL — ABNORMAL HIGH (ref 6–20)
CALCIUM: 9.4 mg/dL (ref 8.9–10.3)
CO2: 28 mmol/L (ref 22–32)
Chloride: 98 mmol/L — ABNORMAL LOW (ref 101–111)
Creatinine, Ser: 7.3 mg/dL — ABNORMAL HIGH (ref 0.44–1.00)
GFR, EST AFRICAN AMERICAN: 8 mL/min — AB (ref >60)
GFR, EST NON AFRICAN AMERICAN: 7 mL/min — AB (ref >60)
GLUCOSE: 109 mg/dL — AB (ref 65–99)
PHOSPHORUS: 6.9 mg/dL — AB (ref 2.5–4.6)
POTASSIUM: 4.8 mmol/L (ref 3.5–5.1)
SODIUM: 138 mmol/L (ref 135–145)

## 2015-11-02 LAB — I-STAT CHEM 8, ED
BUN: 40 mg/dL — AB (ref 6–20)
CALCIUM ION: 1.17 mmol/L (ref 1.13–1.30)
CREATININE: 7.2 mg/dL — AB (ref 0.44–1.00)
Chloride: 96 mmol/L — ABNORMAL LOW (ref 101–111)
Glucose, Bld: 122 mg/dL — ABNORMAL HIGH (ref 65–99)
HEMATOCRIT: 31 % — AB (ref 36.0–46.0)
HEMOGLOBIN: 10.5 g/dL — AB (ref 12.0–15.0)
Potassium: 4.4 mmol/L (ref 3.5–5.1)
SODIUM: 136 mmol/L (ref 135–145)
TCO2: 28 mmol/L (ref 0–100)

## 2015-11-02 LAB — CBC
HEMATOCRIT: 28.7 % — AB (ref 36.0–46.0)
HEMOGLOBIN: 9.3 g/dL — AB (ref 12.0–15.0)
MCH: 28.7 pg (ref 26.0–34.0)
MCHC: 32.4 g/dL (ref 30.0–36.0)
MCV: 88.6 fL (ref 78.0–100.0)
Platelets: 235 10*3/uL (ref 150–400)
RBC: 3.24 MIL/uL — ABNORMAL LOW (ref 3.87–5.11)
RDW: 17 % — ABNORMAL HIGH (ref 11.5–15.5)
WBC: 8.5 10*3/uL (ref 4.0–10.5)

## 2015-11-02 MED ORDER — LIDOCAINE HCL (PF) 1 % IJ SOLN
5.0000 mL | INTRAMUSCULAR | Status: DC | PRN
  Administered 2015-11-02: 0.5 mL via INTRADERMAL

## 2015-11-02 MED ORDER — SEVELAMER CARBONATE 800 MG PO TABS
2400.0000 mg | ORAL_TABLET | Freq: Three times a day (TID) | ORAL | Status: DC
  Filled 2015-11-02 (×10): qty 3

## 2015-11-02 MED ORDER — DIPHENHYDRAMINE HCL 25 MG PO CAPS
25.0000 mg | ORAL_CAPSULE | Freq: Four times a day (QID) | ORAL | Status: DC | PRN
  Administered 2015-11-02: 25 mg via ORAL
  Filled 2015-11-02: qty 1

## 2015-11-02 MED ORDER — SULFAMETHOXAZOLE-TRIMETHOPRIM 800-160 MG PO TABS: 1.0000 | ORAL_TABLET | Freq: Two times a day (BID) | ORAL | 0 refills | Status: DC

## 2015-11-02 MED ORDER — SODIUM CHLORIDE 0.9 % IV SOLN: 100.0000 mL | INTRAVENOUS | Status: DC | PRN

## 2015-11-02 MED ORDER — LIDOCAINE HCL (PF) 1 % IJ SOLN
30.0000 mL | Freq: Once | INTRAMUSCULAR | Status: AC
  Administered 2015-11-02: 5 mL
  Filled 2015-11-02: qty 30

## 2015-11-02 MED ORDER — LIDOCAINE HCL (PF) 1 % IJ SOLN
INTRAMUSCULAR | Status: AC
  Administered 2015-11-02: 0.5 mL via INTRADERMAL
  Filled 2015-11-02: qty 5

## 2015-11-02 MED ORDER — SULFAMETHOXAZOLE-TRIMETHOPRIM 800-160 MG PO TABS
1.0000 | ORAL_TABLET | Freq: Once | ORAL | Status: AC
  Administered 2015-11-02: 1 via ORAL
  Filled 2015-11-02: qty 1

## 2015-11-02 MED ORDER — TRAMADOL HCL 50 MG PO TABS
ORAL_TABLET | ORAL | Status: AC
  Administered 2015-11-02: 50 mg via ORAL
  Filled 2015-11-02: qty 1

## 2015-11-02 MED ORDER — HEPARIN SODIUM (PORCINE) 1000 UNIT/ML DIALYSIS
20.0000 [IU]/kg | INTRAMUSCULAR | Status: DC | PRN
  Administered 2015-11-02: 1500 [IU] via INTRAVENOUS_CENTRAL

## 2015-11-02 MED ORDER — LIDOCAINE HCL (PF) 1 % IJ SOLN
INTRAMUSCULAR | Status: AC
  Filled 2015-11-02: qty 15

## 2015-11-02 MED ORDER — BACITRACIN-NEOMYCIN-POLYMYXIN 400-5-5000 EX OINT
TOPICAL_OINTMENT | CUTANEOUS | Status: AC
  Administered 2015-11-02: 10:00:00
  Filled 2015-11-02: qty 1

## 2015-11-02 MED ORDER — PENTAFLUOROPROP-TETRAFLUOROETH EX AERO: 1.0000 "application " | INHALATION_SPRAY | CUTANEOUS | Status: DC | PRN

## 2015-11-02 MED ORDER — TRAMADOL HCL 50 MG PO TABS
50.0000 mg | ORAL_TABLET | Freq: Two times a day (BID) | ORAL | Status: DC | PRN
  Administered 2015-11-02: 50 mg via ORAL

## 2015-11-02 MED ORDER — EPOETIN ALFA 10000 UNIT/ML IJ SOLN
10000.0000 [IU] | INTRAMUSCULAR | Status: DC
  Administered 2015-11-02: 10000 [IU] via INTRAVENOUS
  Filled 2015-11-02 (×2): qty 1

## 2015-11-02 MED ORDER — SEVELAMER CARBONATE 800 MG PO TABS
ORAL_TABLET | ORAL | Status: AC
  Filled 2015-11-02: qty 3

## 2015-11-02 MED ORDER — HEPARIN SODIUM (PORCINE) 1000 UNIT/ML IJ SOLN
INTRAMUSCULAR | Status: AC
  Administered 2015-11-02: 1500 [IU] via INTRAVENOUS_CENTRAL
  Filled 2015-11-02: qty 6

## 2015-11-02 MED ORDER — EPOETIN ALFA 10000 UNIT/ML IJ SOLN
INTRAMUSCULAR | Status: AC
  Administered 2015-11-02: 10000 [IU] via INTRAVENOUS
  Filled 2015-11-02: qty 1

## 2015-11-02 NOTE — Discharge Summary (Signed)
Physician Discharge Summary  Sara [name] [name] DOB: 1981-03-01 DOA: 11/02/2015  PCP: No PCP Per Patient  Admit date: 11/02/2015 Discharge date: 11/02/2015  Time spent: 45 minutes  Recommendations for Outpatient Follow-up:  Will be discharged home today Instructed to take Bactrim DS BID for 5 days.   Discharge Diagnoses:  Active Problems:   ESRD (end stage renal disease) ([place])   Abscess of left forearm   Discharge Condition: Stable and improved  Filed Weights   11/02/15 0809  Weight: 77.1 kg (170 lb)    History of present illness:  Sara [name] is a 35 y.o. female well known for frequent admission for dialysis. Today she is also complaining of a "knot" over her left forearm. She states this has been there for months, but today she expressed it and some pus came out. It was I and D in the ED, and sent for cx.  Hospital Course:   ESRD -Received HD, no complications.  Left Forearm Abscess -No cellulitis. -Was I and D in the ED, cx pending. -Plan to DC home on bactrim DS for 5 days.  Procedures:  HD  I and D of left forearm abscess   Consultations:  Nephrology  Discharge Instructions  Discharge Instructions    Diet - low sodium heart healthy    Complete by:  As directed   Increase activity slowly    Complete by:  As directed       Medication List    TAKE these medications   acetaminophen 500 MG tablet Commonly known as:  TYLENOL Take 1,000 mg by mouth every 6 (six) hours as needed for moderate pain. Reported on 06/12/2015   furosemide 40 MG tablet Commonly known as:  LASIX Take 3 tablets (120 mg total) by mouth daily.   metoprolol 50 MG tablet Commonly known as:  LOPRESSOR Take 1 tablet (50 mg total) by mouth 2 (two) times daily.   predniSONE 20 MG tablet Commonly known as:  DELTASONE Take 1 tablet (20 mg total) by mouth daily with breakfast.   risperiDONE microspheres 25 MG injection Commonly known as:  RISPERDAL  CONSTA Inject 25 mg into the muscle every 14 (fourteen) days.   sevelamer carbonate 800 MG tablet Commonly known as:  RENVELA Take 4 tablets (3,200 mg total) by mouth 3 (three) times daily with meals.   sulfamethoxazole-trimethoprim 800-160 MG tablet Commonly known as:  BACTRIM DS,SEPTRA DS Take 1 tablet by mouth 2 (two) times daily.   traMADol 50 MG tablet Commonly known as:  ULTRAM Take 1 tablet (50 mg total) by mouth every 6 (six) hours as needed for moderate pain.      Allergies  Allergen Reactions  . Ativan [Lorazepam] Swelling and Other (See Comments)    Dysarthria(patient has difficulty speaking and slurred speech); denies swelling, itching, pain, or numbness.  [name] [Ziprasidone Hcl] Itching and Swelling    Tongue swelling  . Keflex [Cephalexin] Swelling and Other (See Comments)    Tongue swelling. Can't talk   . Haldol [Haloperidol Lactate] Swelling    Tongue swelling. 05/31/15 - MD ok with giving as pt has tolerated in the past Pt can take benadryl.  . Other Itching    wool      The results of significant diagnostics from this hospitalization (including imaging, microbiology, ancillary and laboratory) are listed below for reference.    Significant Diagnostic Studies: Dg Chest 2 View  Result Date: 10/29/2015 CLINICAL DATA:  Shortness of breath.  Chronic renal  failure EXAM: CHEST  2 VIEW COMPARISON:  September 28, 2015 FINDINGS: There is no edema or consolidation. There is cardiomegaly with pulmonary vascularity within normal limits. No adenopathy. There is erosive change in the distal clavicles with extensive bony hypertrophy in these areas, stable. IMPRESSION: Persistent cardiomegaly. No edema or consolidation. Changes in the distal clavicles most likely are due to secondary hyperparathyroidism associated with chronic renal failure. Electronically Signed   By: Lowella Grip III M.D.   On: 10/29/2015 09:56  Dg Finger Thumb Left  Result Date: 11/02/2015 CLINICAL  DATA:  Left thumb pain. EXAM: LEFT THUMB 2+V COMPARISON:  None. FINDINGS: There is no evidence of fracture or dislocation. There is no evidence of arthropathy or other focal bone abnormality. Soft tissues demonstrate diffuse edema. IMPRESSION: No acute fracture or dislocation identified about the left thumb. Diffuse soft tissue edema. Electronically Signed   By: Fidela Salisbury M.D.   On: 11/02/2015 08:54   Microbiology: Recent Results (from the past 240 hour(s))  MRSA PCR Screening     Status: None   Collection Time: 10/29/15 12:00 PM  Result Value Ref Range Status   MRSA by PCR NEGATIVE NEGATIVE Final    Comment:        The GeneXpert MRSA Assay (FDA approved for NASAL specimens only), is one component of a comprehensive MRSA colonization surveillance program. It is not intended to diagnose MRSA infection nor to guide or monitor treatment for MRSA infections.      Labs: Basic Metabolic Panel:  Recent Labs Lab 10/29/15 0834 10/29/15 1334 10/31/15 1202 11/02/15 0829 11/02/15 0900  NA 135 135 136 136 138  K 5.7* 5.9* 4.4 4.4 4.8  CL 100* 98* 99* 96* 98*  CO2  --  23 26  --  28  GLUCOSE 107* 160* 115* 122* 109*  BUN 71* 82* 58* 40* 47*  CREATININE 10.60* 10.98* 8.89* 7.20* 7.30*  CALCIUM  --  8.6* 8.7*  --  9.4  PHOS  --  9.4* 8.0*  --  6.9*   Liver Function Tests:  Recent Labs Lab 10/29/15 1334 10/31/15 1202 11/02/15 0900  ALBUMIN 2.9* 2.9* 3.2*   No results for input(s): LIPASE, AMYLASE in the last 168 hours. No results for input(s): AMMONIA in the last 168 hours. CBC:  Recent Labs Lab 10/29/15 0834 10/29/15 1334 10/31/15 1201 11/02/15 0829 11/02/15 0900  WBC  --  8.5 8.6  --  8.5  HGB 9.9* 8.2* 8.3* 10.5* 9.3*  HCT 29.0* 25.3* 25.6* 31.0* 28.7*  MCV  --  86.9 87.7  --  88.6  PLT  --  220 220  --  235   Cardiac Enzymes: No results for input(s): CKTOTAL, CKMB, CKMBINDEX, TROPONINI in the last 168 hours. BNP: BNP (last 3 results) No results for  input(s): BNP in the last 8760 hours.  ProBNP (last 3 results) No results for input(s): PROBNP in the last 8760 hours.  CBG: No results for input(s): GLUCAP in the last 168 hours.     SignedLelon Frohlich  [facility] Pager: [phone] 11/02/2015, 4:55 PM

## 2015-11-02 NOTE — ED Triage Notes (Signed)
Pt comes in for dialysis. Pt last had dialysis on Wednesday. She states she feels that she needs dialysis again. Only complaint is of joint pain.

## 2015-11-02 NOTE — ED Notes (Signed)
Left area dressed with antibiotic ointment and dry dressing

## 2015-11-02 NOTE — ED Notes (Signed)
Pt going to dialysis before going to room
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
## 2015-11-02 NOTE — ED Provider Notes (Signed)
INCISION AND DRAINAGE Performed by: Roxanna Mew Consent: Verbal consent obtained. Risks and benefits: risks, benefits and alternatives were discussed Type: abscess  Body area: left proximal posterior forearm  Anesthesia: local infiltration  Incision was made with a scalpel.  Local anesthetic: lidocaine 1%   Anesthetic total: 2 ml  Complexity: simple Blunt dissection to break up loculations  Drainage: purulent  Drainage amount: 2cc  Packing material: none  Patient tolerance: Patient tolerated the procedure well with no immediate complications.     Roxanna Mew, [place] 11/02/15 [id number]    Ezequiel Essex, MD 11/02/15 [phone]

## 2015-11-02 NOTE — Progress Notes (Signed)
Pt discharged home today per Dr. Jerilee Hoh after dialysis.  Pt did not wait to sign D/C instructions, however Dr. Jerilee Hoh confirmed that prescription would be called into [facility] on [address] in [place].  Pt verbalized understanding.  Pt left floor in stable condition not accompanied by staff.
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
## 2015-11-05 ENCOUNTER — Emergency Department (HOSPITAL_COMMUNITY)
Admission: EM | Admit: 2015-11-05 | Discharge: 2015-11-05 | Disposition: A | Discharge status: Home / Self Care | Payer: Medicare Other | Attending: Dermatology | Admitting: Dermatology

## 2015-11-05 ENCOUNTER — Encounter (HOSPITAL_COMMUNITY): Payer: Self-pay | Admitting: Emergency Medicine

## 2015-11-05 DIAGNOSIS — F17210 Nicotine dependence, cigarettes, uncomplicated: Secondary | ICD-10-CM | POA: Diagnosis not present

## 2015-11-05 DIAGNOSIS — Z992 Dependence on renal dialysis: Secondary | ICD-10-CM | POA: Diagnosis not present

## 2015-11-05 DIAGNOSIS — Z452 Encounter for adjustment and management of vascular access device: Secondary | ICD-10-CM | POA: Diagnosis not present

## 2015-11-05 DIAGNOSIS — I509 Heart failure, unspecified: Secondary | ICD-10-CM | POA: Insufficient documentation

## 2015-11-05 DIAGNOSIS — I132 Hypertensive heart and chronic kidney disease with heart failure and with stage 5 chronic kidney disease, or end stage renal disease: Secondary | ICD-10-CM | POA: Insufficient documentation

## 2015-11-05 DIAGNOSIS — N186 End stage renal disease: Secondary | ICD-10-CM | POA: Insufficient documentation

## 2015-11-05 DIAGNOSIS — Z5321 Procedure and treatment not carried out due to patient leaving prior to being seen by health care provider: Secondary | ICD-10-CM | POA: Diagnosis not present

## 2015-11-05 LAB — I-STAT CHEM 8, ED
BUN: 48 mg/dL — AB (ref 6–20)
CALCIUM ION: 1.14 mmol/L (ref 1.13–1.30)
CREATININE: 9.1 mg/dL — AB (ref 0.44–1.00)
Chloride: 96 mmol/L — ABNORMAL LOW (ref 101–111)
Glucose, Bld: 111 mg/dL — ABNORMAL HIGH (ref 65–99)
HCT: 30 % — ABNORMAL LOW (ref 36.0–46.0)
HEMOGLOBIN: 10.2 g/dL — AB (ref 12.0–15.0)
Potassium: 4.7 mmol/L (ref 3.5–5.1)
SODIUM: 136 mmol/L (ref 135–145)
TCO2: 29 mmol/L (ref 0–100)

## 2015-11-05 NOTE — ED Triage Notes (Signed)
Patient presents for dialysis

## 2015-11-06 ENCOUNTER — Encounter (HOSPITAL_COMMUNITY): Payer: Self-pay | Admitting: Emergency Medicine

## 2015-11-06 ENCOUNTER — Observation Stay (HOSPITAL_COMMUNITY)
Admission: EM | Admit: 2015-11-06 | Discharge: 2015-11-06 | Disposition: A | Discharge status: Home / Self Care | Payer: Medicare Other | Attending: Internal Medicine | Admitting: Internal Medicine

## 2015-11-06 DIAGNOSIS — I132 Hypertensive heart and chronic kidney disease with heart failure and with stage 5 chronic kidney disease, or end stage renal disease: Principal | ICD-10-CM | POA: Insufficient documentation

## 2015-11-06 DIAGNOSIS — Z992 Dependence on renal dialysis: Secondary | ICD-10-CM | POA: Diagnosis not present

## 2015-11-06 DIAGNOSIS — Z79899 Other long term (current) drug therapy: Secondary | ICD-10-CM | POA: Insufficient documentation

## 2015-11-06 DIAGNOSIS — F17210 Nicotine dependence, cigarettes, uncomplicated: Secondary | ICD-10-CM | POA: Insufficient documentation

## 2015-11-06 DIAGNOSIS — M7989 Other specified soft tissue disorders: Secondary | ICD-10-CM | POA: Diagnosis not present

## 2015-11-06 DIAGNOSIS — I10 Essential (primary) hypertension: Secondary | ICD-10-CM | POA: Diagnosis not present

## 2015-11-06 DIAGNOSIS — I5022 Chronic systolic (congestive) heart failure: Secondary | ICD-10-CM | POA: Insufficient documentation

## 2015-11-06 DIAGNOSIS — I509 Heart failure, unspecified: Secondary | ICD-10-CM | POA: Diagnosis not present

## 2015-11-06 DIAGNOSIS — N186 End stage renal disease: Secondary | ICD-10-CM | POA: Diagnosis not present

## 2015-11-06 LAB — RENAL FUNCTION PANEL
ANION GAP: 10 (ref 5–15)
Albumin: 2.9 g/dL — ABNORMAL LOW (ref 3.5–5.0)
BUN: 64 mg/dL — ABNORMAL HIGH (ref 6–20)
CALCIUM: 8.5 mg/dL — AB (ref 8.9–10.3)
CHLORIDE: 99 mmol/L — AB (ref 101–111)
CO2: 24 mmol/L (ref 22–32)
Creatinine, Ser: 9.74 mg/dL — ABNORMAL HIGH (ref 0.44–1.00)
GFR calc non Af Amer: 5 mL/min — ABNORMAL LOW (ref >60)
GFR, EST AFRICAN AMERICAN: 5 mL/min — AB (ref >60)
GLUCOSE: 91 mg/dL (ref 65–99)
POTASSIUM: 4.6 mmol/L (ref 3.5–5.1)
Phosphorus: 7.4 mg/dL — ABNORMAL HIGH (ref 2.5–4.6)
SODIUM: 133 mmol/L — AB (ref 135–145)

## 2015-11-06 LAB — I-STAT CHEM 8, ED
BUN: 55 mg/dL — ABNORMAL HIGH (ref 6–20)
CREATININE: 9.8 mg/dL — AB (ref 0.44–1.00)
Calcium, Ion: 1.14 mmol/L (ref 1.13–1.30)
Chloride: 96 mmol/L — ABNORMAL LOW (ref 101–111)
GLUCOSE: 87 mg/dL (ref 65–99)
HCT: 30 % — ABNORMAL LOW (ref 36.0–46.0)
HEMOGLOBIN: 10.2 g/dL — AB (ref 12.0–15.0)
Potassium: 4.7 mmol/L (ref 3.5–5.1)
Sodium: 135 mmol/L (ref 135–145)
TCO2: 27 mmol/L (ref 0–100)

## 2015-11-06 LAB — CBC
HEMATOCRIT: 24.8 % — AB (ref 36.0–46.0)
HEMOGLOBIN: 8.1 g/dL — AB (ref 12.0–15.0)
MCH: 28.6 pg (ref 26.0–34.0)
MCHC: 32.7 g/dL (ref 30.0–36.0)
MCV: 87.6 fL (ref 78.0–100.0)
Platelets: 165 10*3/uL (ref 150–400)
RBC: 2.83 MIL/uL — AB (ref 3.87–5.11)
RDW: 16.8 % — ABNORMAL HIGH (ref 11.5–15.5)
WBC: 7.5 10*3/uL (ref 4.0–10.5)

## 2015-11-06 MED ORDER — LIDOCAINE HCL (PF) 1 % IJ SOLN
5.0000 mL | INTRAMUSCULAR | Status: DC | PRN
  Administered 2015-11-06: 0.5 mL via INTRADERMAL

## 2015-11-06 MED ORDER — EPOETIN ALFA 20000 UNIT/ML IJ SOLN
14000.0000 [IU] | INTRAMUSCULAR | Status: DC
  Administered 2015-11-06: 14000 [IU] via INTRAVENOUS

## 2015-11-06 MED ORDER — DIPHENHYDRAMINE HCL 25 MG PO CAPS
ORAL_CAPSULE | ORAL | Status: AC
Start: 2015-11-06 — End: 2015-11-06
  Administered 2015-11-06: 25 mg via ORAL
  Filled 2015-11-06: qty 1

## 2015-11-06 MED ORDER — HEPARIN SODIUM (PORCINE) 1000 UNIT/ML DIALYSIS
20.0000 [IU]/kg | INTRAMUSCULAR | Status: DC | PRN
  Administered 2015-11-06: 1800 [IU] via INTRAVENOUS_CENTRAL

## 2015-11-06 MED ORDER — HEPARIN SODIUM (PORCINE) 1000 UNIT/ML DIALYSIS: 1000.0000 [IU] | INTRAMUSCULAR | Status: DC | PRN

## 2015-11-06 MED ORDER — EPOETIN ALFA 20000 UNIT/ML IJ SOLN
INTRAMUSCULAR | Status: AC
  Administered 2015-11-06: 14000 [IU] via INTRAVENOUS
  Filled 2015-11-06: qty 1

## 2015-11-06 MED ORDER — LIDOCAINE-PRILOCAINE 2.5-2.5 % EX CREA: 1.0000 "application " | TOPICAL_CREAM | CUTANEOUS | Status: DC | PRN

## 2015-11-06 MED ORDER — HEPARIN SODIUM (PORCINE) 1000 UNIT/ML IJ SOLN
INTRAMUSCULAR | Status: AC
Start: 2015-11-06 — End: 2015-11-06
  Administered 2015-11-06: 1800 [IU] via INTRAVENOUS_CENTRAL
  Filled 2015-11-06: qty 6

## 2015-11-06 MED ORDER — DIPHENHYDRAMINE HCL 25 MG PO CAPS
25.0000 mg | ORAL_CAPSULE | Freq: Four times a day (QID) | ORAL | Status: DC | PRN
  Administered 2015-11-06: 25 mg via ORAL

## 2015-11-06 MED ORDER — SEVELAMER CARBONATE 800 MG PO TABS
ORAL_TABLET | ORAL | Status: AC
  Administered 2015-11-06: 2400 mg via ORAL
  Filled 2015-11-06: qty 3

## 2015-11-06 MED ORDER — TRAMADOL HCL 50 MG PO TABS
ORAL_TABLET | ORAL | Status: AC
  Administered 2015-11-06: 50 mg via ORAL
  Filled 2015-11-06: qty 1

## 2015-11-06 MED ORDER — TRAMADOL HCL 50 MG PO TABS
50.0000 mg | ORAL_TABLET | Freq: Two times a day (BID) | ORAL | Status: DC | PRN
  Administered 2015-11-06: 50 mg via ORAL

## 2015-11-06 MED ORDER — SEVELAMER CARBONATE 800 MG PO TABS
2400.0000 mg | ORAL_TABLET | Freq: Three times a day (TID) | ORAL | Status: DC
  Administered 2015-11-06: 2400 mg via ORAL

## 2015-11-06 MED ORDER — PENTAFLUOROPROP-TETRAFLUOROETH EX AERO: 1.0000 "application " | INHALATION_SPRAY | CUTANEOUS | Status: DC | PRN

## 2015-11-06 MED ORDER — LIDOCAINE HCL (PF) 1 % IJ SOLN
INTRAMUSCULAR | Status: AC
  Administered 2015-11-06: 0.5 mL via INTRADERMAL
  Filled 2015-11-06: qty 5

## 2015-11-06 MED ORDER — SODIUM CHLORIDE 0.9 % IV SOLN: 100.0000 mL | INTRAVENOUS | Status: DC | PRN

## 2015-11-06 MED ORDER — ALTEPLASE 2 MG IJ SOLR: 2.0000 mg | Freq: Once | INTRAMUSCULAR | Status: DC | PRN

## 2015-11-06 NOTE — ED Triage Notes (Signed)
Pt requesting dialysis, came here yesterday to be seen but LWBS.
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
## 2015-11-06 NOTE — Discharge Summary (Signed)
Patient left AMA immedially following dialysis treatment.  Domingo Mend, MD [facility] Pager: [phone]

## 2015-11-06 NOTE — Progress Notes (Signed)
Pt. Left AMA after dialysis per Levada Dy, dialysis nurse.
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
## 2015-11-06 NOTE — Procedures (Signed)
HEMODIALYSIS TREATMENT NOTE:  4.5 hour low-heparin dialysis completed via right upper arm AVF (15g ante/retrograde). Goal met: 4.1 liters removed without interruption in ultrafiltration. All blood was returned and hemostasis was achieved within 15 minutes. Report called to Linus Orn, RN.  Rockwell Alexandria, RN, CDN

## 2015-11-06 NOTE — ED Notes (Signed)
Report given to Leslie, RN for room 326. 

## 2015-11-06 NOTE — Progress Notes (Signed)
   HEMODIALYSIS NURSING NOTE:  Pt stated she did not have time to wait for Dr. Jerilee Hoh to complete discharge paperwork. "My ride is very impatient."  Pt signed waiver and left hospital AMA, ambulatory with steady gait.    Rockwell Alexandria, RN, CDN

## 2015-11-07 LAB — HEPATITIS B SURFACE ANTIGEN: HEP B S AG: NEGATIVE

## 2015-11-09 ENCOUNTER — Encounter (HOSPITAL_COMMUNITY): Payer: Self-pay | Admitting: Emergency Medicine

## 2015-11-09 ENCOUNTER — Observation Stay (HOSPITAL_COMMUNITY)
Admission: EM | Admit: 2015-11-09 | Discharge: 2015-11-09 | Disposition: A | Discharge status: Home / Self Care | Payer: Medicare Other | Attending: Family Medicine | Admitting: Family Medicine

## 2015-11-09 DIAGNOSIS — Z79899 Other long term (current) drug therapy: Secondary | ICD-10-CM | POA: Insufficient documentation

## 2015-11-09 DIAGNOSIS — D631 Anemia in chronic kidney disease: Secondary | ICD-10-CM | POA: Diagnosis present

## 2015-11-09 DIAGNOSIS — N186 End stage renal disease: Secondary | ICD-10-CM | POA: Diagnosis not present

## 2015-11-09 DIAGNOSIS — I120 Hypertensive chronic kidney disease with stage 5 chronic kidney disease or end stage renal disease: Secondary | ICD-10-CM | POA: Diagnosis not present

## 2015-11-09 DIAGNOSIS — E8770 Fluid overload, unspecified: Secondary | ICD-10-CM | POA: Diagnosis present

## 2015-11-09 DIAGNOSIS — E8779 Other fluid overload: Secondary | ICD-10-CM

## 2015-11-09 DIAGNOSIS — I132 Hypertensive heart and chronic kidney disease with heart failure and with stage 5 chronic kidney disease, or end stage renal disease: Secondary | ICD-10-CM | POA: Diagnosis not present

## 2015-11-09 DIAGNOSIS — Z992 Dependence on renal dialysis: Secondary | ICD-10-CM

## 2015-11-09 DIAGNOSIS — M549 Dorsalgia, unspecified: Secondary | ICD-10-CM | POA: Insufficient documentation

## 2015-11-09 DIAGNOSIS — I509 Heart failure, unspecified: Secondary | ICD-10-CM | POA: Diagnosis not present

## 2015-11-09 DIAGNOSIS — F17210 Nicotine dependence, cigarettes, uncomplicated: Secondary | ICD-10-CM | POA: Diagnosis not present

## 2015-11-09 LAB — CBC
HCT: 26.2 % — ABNORMAL LOW (ref 36.0–46.0)
Hemoglobin: 8.5 g/dL — ABNORMAL LOW (ref 12.0–15.0)
MCH: 28.6 pg (ref 26.0–34.0)
MCHC: 32.4 g/dL (ref 30.0–36.0)
MCV: 88.2 fL (ref 78.0–100.0)
Platelets: 177 K/uL (ref 150–400)
RBC: 2.97 MIL/uL — ABNORMAL LOW (ref 3.87–5.11)
RDW: 16.9 % — ABNORMAL HIGH (ref 11.5–15.5)
WBC: 7.7 K/uL (ref 4.0–10.5)

## 2015-11-09 LAB — I-STAT CHEM 8, ED
BUN: 56 mg/dL — AB (ref 6–20)
CALCIUM ION: 1.12 mmol/L — AB (ref 1.13–1.30)
CREATININE: 9 mg/dL — AB (ref 0.44–1.00)
Chloride: 97 mmol/L — ABNORMAL LOW (ref 101–111)
Glucose, Bld: 78 mg/dL (ref 65–99)
HEMATOCRIT: 32 % — AB (ref 36.0–46.0)
HEMOGLOBIN: 10.9 g/dL — AB (ref 12.0–15.0)
Potassium: 4.7 mmol/L (ref 3.5–5.1)
SODIUM: 135 mmol/L (ref 135–145)
TCO2: 28 mmol/L (ref 0–100)

## 2015-11-09 LAB — RENAL FUNCTION PANEL
ALBUMIN: 3.1 g/dL — AB (ref 3.5–5.0)
ANION GAP: 12 (ref 5–15)
BUN: 65 mg/dL — AB (ref 6–20)
CALCIUM: 8.6 mg/dL — AB (ref 8.9–10.3)
CO2: 25 mmol/L (ref 22–32)
CREATININE: 9.62 mg/dL — AB (ref 0.44–1.00)
Chloride: 98 mmol/L — ABNORMAL LOW (ref 101–111)
GFR calc Af Amer: 5 mL/min — ABNORMAL LOW (ref >60)
GFR calc non Af Amer: 5 mL/min — ABNORMAL LOW (ref >60)
Glucose, Bld: 121 mg/dL — ABNORMAL HIGH (ref 65–99)
Phosphorus: 8.2 mg/dL — ABNORMAL HIGH (ref 2.5–4.6)
Potassium: 5.2 mmol/L — ABNORMAL HIGH (ref 3.5–5.1)
SODIUM: 135 mmol/L (ref 135–145)

## 2015-11-09 MED ORDER — TRAMADOL HCL 50 MG PO TABS
50.0000 mg | ORAL_TABLET | Freq: Four times a day (QID) | ORAL | Status: DC | PRN
  Administered 2015-11-09: 50 mg via ORAL
  Filled 2015-11-09: qty 1

## 2015-11-09 MED ORDER — FUROSEMIDE 80 MG PO TABS: 120.0000 mg | ORAL_TABLET | Freq: Every day | ORAL | Status: DC

## 2015-11-09 MED ORDER — ACETAMINOPHEN 500 MG PO TABS: 1000.0000 mg | ORAL_TABLET | Freq: Four times a day (QID) | ORAL | Status: DC | PRN

## 2015-11-09 MED ORDER — METOPROLOL TARTRATE 50 MG PO TABS: 50.0000 mg | ORAL_TABLET | Freq: Two times a day (BID) | ORAL | Status: DC

## 2015-11-09 MED ORDER — SEVELAMER CARBONATE 800 MG PO TABS
3200.0000 mg | ORAL_TABLET | Freq: Three times a day (TID) | ORAL | Status: DC
  Administered 2015-11-09 (×2): 3200 mg via ORAL
  Filled 2015-11-09 (×2): qty 4

## 2015-11-09 MED ORDER — PREDNISONE 20 MG PO TABS: 20.0000 mg | ORAL_TABLET | Freq: Every day | ORAL | Status: DC

## 2015-11-09 MED ORDER — DIPHENHYDRAMINE HCL 25 MG PO CAPS
25.0000 mg | ORAL_CAPSULE | Freq: Four times a day (QID) | ORAL | Status: DC | PRN
  Administered 2015-11-09: 25 mg via ORAL

## 2015-11-09 MED ORDER — PENTAFLUOROPROP-TETRAFLUOROETH EX AERO
1.0000 | INHALATION_SPRAY | CUTANEOUS | Status: DC | PRN
Start: 2015-11-09 — End: 2015-11-09

## 2015-11-09 MED ORDER — HEPARIN SODIUM (PORCINE) 1000 UNIT/ML IJ SOLN
INTRAMUSCULAR | Status: AC
  Administered 2015-11-09: 1800 [IU] via INTRAVENOUS_CENTRAL
  Filled 2015-11-09: qty 4

## 2015-11-09 MED ORDER — SODIUM CHLORIDE 0.9 % IV SOLN: 100.0000 mL | INTRAVENOUS | Status: DC | PRN

## 2015-11-09 MED ORDER — LIDOCAINE-PRILOCAINE 2.5-2.5 % EX CREA
1.0000 | TOPICAL_CREAM | CUTANEOUS | Status: DC | PRN
Start: 2015-11-09 — End: 2015-11-09

## 2015-11-09 MED ORDER — DIPHENHYDRAMINE HCL 25 MG PO CAPS
ORAL_CAPSULE | ORAL | Status: AC
  Administered 2015-11-09: 25 mg via ORAL
  Filled 2015-11-09: qty 1

## 2015-11-09 MED ORDER — LIDOCAINE HCL (PF) 1 % IJ SOLN
5.0000 mL | INTRAMUSCULAR | Status: DC | PRN
  Administered 2015-11-09: 0.5 mL via INTRADERMAL

## 2015-11-09 MED ORDER — HEPARIN SODIUM (PORCINE) 1000 UNIT/ML DIALYSIS
20.0000 [IU]/kg | INTRAMUSCULAR | Status: DC | PRN
  Administered 2015-11-09: 1800 [IU] via INTRAVENOUS_CENTRAL

## 2015-11-09 MED ORDER — LIDOCAINE HCL (PF) 1 % IJ SOLN
INTRAMUSCULAR | Status: AC
Start: 2015-11-09 — End: 2015-11-09
  Administered 2015-11-09: 0.5 mL via INTRADERMAL
  Filled 2015-11-09: qty 5

## 2015-11-09 NOTE — Procedures (Signed)
   HEMODIALYSIS TREATMENT NOTE:  4.5 hour low-heparin dialysis was ordered.  Goal not met: only 3.8 liters removed as pt elected to terminate HD session early/AMA with 50 minutes remaining.  "I just feel like I've been on here [dialysis] forever.  I wanna go."  Stated she intended to leave hospital immediately after dialysis.  Pt signed two AMA waivers and HD was ended at her request.  All blood was returned and hemostasis was achieved within 10 minutes. VSS.  Pt was transported back to 328 and report was given to Tedd Sias, Therapist, sports.  Rockwell Alexandria, RN, CDN
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
## 2015-11-09 NOTE — ED Notes (Signed)
Report given to [place], Therapist, sports. Pt ready for transfer to room.
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
## 2015-11-09 NOTE — ED Notes (Signed)
Pt here for dialysis, last dialyzed on 11/06/15. Nad noted.

## 2015-11-09 NOTE — ED Triage Notes (Signed)
Pt here requesting dialysis.

## 2015-11-12 ENCOUNTER — Encounter (HOSPITAL_COMMUNITY): Payer: Self-pay | Admitting: Emergency Medicine

## 2015-11-12 ENCOUNTER — Observation Stay (HOSPITAL_COMMUNITY)
Admission: EM | Admit: 2015-11-12 | Discharge: 2015-11-12 | Disposition: A | Discharge status: Home / Self Care | Payer: Medicare Other | Attending: Family Medicine | Admitting: Family Medicine

## 2015-11-12 DIAGNOSIS — I509 Heart failure, unspecified: Secondary | ICD-10-CM | POA: Insufficient documentation

## 2015-11-12 DIAGNOSIS — N186 End stage renal disease: Secondary | ICD-10-CM | POA: Diagnosis not present

## 2015-11-12 DIAGNOSIS — I10 Essential (primary) hypertension: Secondary | ICD-10-CM | POA: Diagnosis not present

## 2015-11-12 DIAGNOSIS — E8779 Other fluid overload: Secondary | ICD-10-CM

## 2015-11-12 DIAGNOSIS — F17210 Nicotine dependence, cigarettes, uncomplicated: Secondary | ICD-10-CM | POA: Insufficient documentation

## 2015-11-12 DIAGNOSIS — A047 Enterocolitis due to Clostridium difficile: Secondary | ICD-10-CM

## 2015-11-12 DIAGNOSIS — D631 Anemia in chronic kidney disease: Secondary | ICD-10-CM | POA: Diagnosis not present

## 2015-11-12 DIAGNOSIS — Z79899 Other long term (current) drug therapy: Secondary | ICD-10-CM | POA: Diagnosis not present

## 2015-11-12 DIAGNOSIS — I120 Hypertensive chronic kidney disease with stage 5 chronic kidney disease or end stage renal disease: Secondary | ICD-10-CM | POA: Diagnosis not present

## 2015-11-12 DIAGNOSIS — Z992 Dependence on renal dialysis: Secondary | ICD-10-CM | POA: Insufficient documentation

## 2015-11-12 DIAGNOSIS — I132 Hypertensive heart and chronic kidney disease with heart failure and with stage 5 chronic kidney disease, or end stage renal disease: Principal | ICD-10-CM | POA: Insufficient documentation

## 2015-11-12 DIAGNOSIS — N19 Unspecified kidney failure: Secondary | ICD-10-CM | POA: Diagnosis present

## 2015-11-12 DIAGNOSIS — E8770 Fluid overload, unspecified: Secondary | ICD-10-CM | POA: Diagnosis present

## 2015-11-12 LAB — RENAL FUNCTION PANEL
Albumin: 3.1 g/dL — ABNORMAL LOW (ref 3.5–5.0)
Anion gap: 11 (ref 5–15)
BUN: 66 mg/dL — AB (ref 6–20)
CALCIUM: 8.1 mg/dL — AB (ref 8.9–10.3)
CHLORIDE: 95 mmol/L — AB (ref 101–111)
CO2: 25 mmol/L (ref 22–32)
CREATININE: 10.79 mg/dL — AB (ref 0.44–1.00)
GFR calc non Af Amer: 4 mL/min — ABNORMAL LOW (ref >60)
GFR, EST AFRICAN AMERICAN: 5 mL/min — AB (ref >60)
GLUCOSE: 121 mg/dL — AB (ref 65–99)
Phosphorus: 8.3 mg/dL — ABNORMAL HIGH (ref 2.5–4.6)
Potassium: 4.8 mmol/L (ref 3.5–5.1)
SODIUM: 131 mmol/L — AB (ref 135–145)

## 2015-11-12 LAB — I-STAT CHEM 8, ED
BUN: 51 mg/dL — AB (ref 6–20)
CHLORIDE: 98 mmol/L — AB (ref 101–111)
CREATININE: 10.7 mg/dL — AB (ref 0.44–1.00)
Calcium, Ion: 0.83 mmol/L — ABNORMAL LOW (ref 1.13–1.30)
Glucose, Bld: 103 mg/dL — ABNORMAL HIGH (ref 65–99)
HEMATOCRIT: 28 % — AB (ref 36.0–46.0)
Hemoglobin: 9.5 g/dL — ABNORMAL LOW (ref 12.0–15.0)
POTASSIUM: 4.4 mmol/L (ref 3.5–5.1)
Sodium: 131 mmol/L — ABNORMAL LOW (ref 135–145)
TCO2: 25 mmol/L (ref 0–100)

## 2015-11-12 LAB — CBC
HCT: 25.1 % — ABNORMAL LOW (ref 36.0–46.0)
Hemoglobin: 8.2 g/dL — ABNORMAL LOW (ref 12.0–15.0)
MCH: 28.2 pg (ref 26.0–34.0)
MCHC: 32.7 g/dL (ref 30.0–36.0)
MCV: 86.3 fL (ref 78.0–100.0)
PLATELETS: 167 10*3/uL (ref 150–400)
RBC: 2.91 MIL/uL — ABNORMAL LOW (ref 3.87–5.11)
RDW: 16.7 % — AB (ref 11.5–15.5)
WBC: 4.7 10*3/uL (ref 4.0–10.5)

## 2015-11-12 LAB — C DIFFICILE QUICK SCREEN W PCR REFLEX
C DIFFICLE (CDIFF) ANTIGEN: POSITIVE — AB
C Diff toxin: NEGATIVE

## 2015-11-12 MED ORDER — ACETAMINOPHEN 500 MG PO TABS: 1000.0000 mg | ORAL_TABLET | Freq: Four times a day (QID) | ORAL | Status: DC | PRN

## 2015-11-12 MED ORDER — PENTAFLUOROPROP-TETRAFLUOROETH EX AERO
INHALATION_SPRAY | CUTANEOUS | Status: DC
Start: 2015-11-12 — End: 2015-11-13
  Filled 2015-11-12: qty 103.5

## 2015-11-12 MED ORDER — DIPHENHYDRAMINE HCL 25 MG PO CAPS
ORAL_CAPSULE | ORAL | Status: AC
  Filled 2015-11-12: qty 1

## 2015-11-12 MED ORDER — RISPERIDONE MICROSPHERES 25 MG IM SUSR: 25.0000 mg | INTRAMUSCULAR | Status: DC

## 2015-11-12 MED ORDER — ALTEPLASE 2 MG IJ SOLR
2.0000 mg | Freq: Once | INTRAMUSCULAR | Status: DC | PRN
  Filled 2015-11-12: qty 2

## 2015-11-12 MED ORDER — EPOETIN ALFA 10000 UNIT/ML IJ SOLN
10000.0000 [IU] | INTRAMUSCULAR | Status: DC
  Filled 2015-11-12 (×2): qty 1

## 2015-11-12 MED ORDER — HEPARIN SODIUM (PORCINE) 1000 UNIT/ML DIALYSIS
20.0000 [IU]/kg | INTRAMUSCULAR | Status: DC | PRN
  Filled 2015-11-12: qty 2

## 2015-11-12 MED ORDER — DIPHENHYDRAMINE HCL 25 MG PO CAPS
25.0000 mg | ORAL_CAPSULE | Freq: Four times a day (QID) | ORAL | Status: DC | PRN
  Administered 2015-11-12: 25 mg via ORAL

## 2015-11-12 MED ORDER — PREDNISONE 20 MG PO TABS: 20.0000 mg | ORAL_TABLET | Freq: Every day | ORAL | Status: DC

## 2015-11-12 MED ORDER — SODIUM CHLORIDE 0.9 % IV SOLN: 100.0000 mL | INTRAVENOUS | Status: DC | PRN

## 2015-11-12 MED ORDER — LIDOCAINE HCL (PF) 1 % IJ SOLN
INTRAMUSCULAR | Status: AC
  Administered 2015-11-12: 5 mL
  Filled 2015-11-12: qty 5

## 2015-11-12 MED ORDER — FUROSEMIDE 80 MG PO TABS: 120.0000 mg | ORAL_TABLET | Freq: Every day | ORAL | Status: DC

## 2015-11-12 MED ORDER — PENTAFLUOROPROP-TETRAFLUOROETH EX AERO: 1.0000 "application " | INHALATION_SPRAY | CUTANEOUS | Status: DC | PRN

## 2015-11-12 MED ORDER — LIDOCAINE-PRILOCAINE 2.5-2.5 % EX CREA: 1.0000 "application " | TOPICAL_CREAM | CUTANEOUS | Status: DC | PRN

## 2015-11-12 MED ORDER — LIDOCAINE HCL (PF) 1 % IJ SOLN: 5.0000 mL | INTRAMUSCULAR | Status: DC | PRN

## 2015-11-12 MED ORDER — EPOETIN ALFA 10000 UNIT/ML IJ SOLN
INTRAMUSCULAR | Status: AC
  Administered 2015-11-12: 10000 [IU]
  Filled 2015-11-12: qty 1

## 2015-11-12 MED ORDER — TRAMADOL HCL 50 MG PO TABS
ORAL_TABLET | ORAL | Status: AC
  Administered 2015-11-12: 50 mg
  Filled 2015-11-12: qty 1

## 2015-11-12 MED ORDER — METOPROLOL TARTRATE 50 MG PO TABS
50.0000 mg | ORAL_TABLET | Freq: Two times a day (BID) | ORAL | Status: DC
  Filled 2015-11-12: qty 1

## 2015-11-12 MED ORDER — TRAMADOL HCL 50 MG PO TABS: 50.0000 mg | ORAL_TABLET | Freq: Four times a day (QID) | ORAL | Status: DC | PRN

## 2015-11-12 MED ORDER — SEVELAMER CARBONATE 800 MG PO TABS
3200.0000 mg | ORAL_TABLET | Freq: Three times a day (TID) | ORAL | Status: DC
  Administered 2015-11-12: 3200 mg via ORAL
  Filled 2015-11-12: qty 4

## 2015-11-12 MED ORDER — HEPARIN SODIUM (PORCINE) 1000 UNIT/ML DIALYSIS
1000.0000 [IU] | INTRAMUSCULAR | Status: DC | PRN
  Filled 2015-11-12: qty 1

## 2015-11-12 NOTE — ED Notes (Signed)
Pt sleeping soundly.  Not disturbed for vitals at this time.

## 2015-11-12 NOTE — Procedures (Signed)
   HEMODIALYSIS TREATMENT NOTE:  4.5 hour low-heparin dialysis completed via right upper arm AVF (15g ante/retrograde). Goal met: 4.3 liters removed without interruption in ultrafiltration. All blood was returned and hemostasis was achieved within 15 minutes. Report given to Birdie Hopes, RN.  Rockwell Alexandria, RN, CDN
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
## 2015-11-12 NOTE — Discharge Summary (Signed)
  Physician Discharge Summary  BAISLEY GASSNER [id number] DOB: 02-Dec-1980 DOA: 11/09/2015  PCP: Pcp Not In System  Admit date: 11/09/2015 Discharge date: 11/12/2015  Patient left AGAINST MEDICAL ADVICE.   Discharge Diagnoses:  1. Volume overload secondary to end-stage renal disease. 2. Anemia of chronic kidney disease  Discharge Condition: Left AGAINST MEDICAL ADVICE Disposition: Home   Filed Weights   11/09/15 0753 11/09/15 1225  Weight: 88 kg (194 lb) 89.2 kg (196 lb 10.4 oz)    History of present illness:  35 year old woman with end-stage renal disease hemodialysis dependent, history of noncompliance and not a candidate for outpatient hemodialysis presented to the emergency department with volume overload. Emergency department physician discussed with nephrology who recommended dialysis. We have been asked to assist with observation.  Hospital Course:  Patient underwent hemodialysis without event. She subsequently left AMA.    Principal Problem:   Volume overload Active Problems:   Admission for dialysis ([place])   Anemia in end-stage renal disease ([place])   ESRD (end stage renal disease) ([place])   Time coordinating discharge: 10 minutes  Signed:  Murray Hodgkins, MD [facility] 11/12/2015, 3:38 PM

## 2015-11-12 NOTE — Progress Notes (Signed)
Patient reports loose watery stools over the last couple of days, recently finished abx.  MD made aware and placed on brown precautions.  Stool sent.
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
## 2015-11-12 NOTE — ED Notes (Addendum)
Pt refusing IV access.

## 2015-11-12 NOTE — ED Notes (Signed)
Per Alecia Lemming NT, when she arrived in room no bed was available to put pt in.  NT on floor stated she needed to go get bed from second floor.  Pt stated she was comfortable in recliner and was assisted there until bed would be available.

## 2015-11-12 NOTE — ED Triage Notes (Signed)
Pt reports "needs dialysis". Pt denies pain. Pt reports abd swelling since Saturday. Pt reports last dialysis on Friday. nad noted.

## 2015-11-12 NOTE — Progress Notes (Signed)
Patient left AMA without notifying staff.

## 2015-11-13 LAB — CLOSTRIDIUM DIFFICILE BY PCR: Toxigenic C. Difficile by PCR: POSITIVE — AB

## 2015-11-13 NOTE — Discharge Summary (Signed)
Physician Discharge Summary  Sara [name] [name] DOB: [birth year] DOA: 11/12/2015  PCP: Pcp Not In System  Admit date: 11/12/2015 Discharge date: 11/13/2015  Patient left AGAINST MEDICAL ADVICE.  Discharge Diagnoses:  1. End-stage renal disease with volume overload. 2. C. difficile diarrhea  Discharge Condition: improved Disposition: left AMA  Filed Weights   11/12/15 0827 11/12/15 0828 11/12/15 1645  Weight: 86.2 kg (190 lb) 83 kg (182 lb 14.4 oz) 82.9 kg (182 lb 12.2 oz)    History of present illness:  35 year old woman with end-stage renal disease who is unable to get outpatient dialysis because of remote behavioral issues who presents to the emergency department for swelling and hemodialysis.  Hospital Course:  Patient reported diarrhea to nursing staff. Approximate 7 episodes. C. difficile screen was sent. The patient underwent hemodialysis without complication and subsequently left AMA 8/7.  8/8 C. difficile screen was equivocal and C. difficile PCR was positive. I talked with the patient 8/8 by telephone and she reported diarrhea today. Therefore I have called in oral vancomycin 125 mg by mouth 4 times a day 14 days to [facility] [address]. Patient aware.   Discharge Instructions    Allergies  Allergen Reactions  . Ativan [Lorazepam] Swelling and Other (See Comments)    Dysarthria(patient has difficulty speaking and slurred speech); denies swelling, itching, pain, or numbness.  [name] [Ziprasidone Hcl] Itching and Swelling    Tongue swelling  . Keflex [Cephalexin] Swelling and Other (See Comments)    Tongue swelling. Can't talk   . Haldol [Haloperidol Lactate] Swelling    Tongue swelling. 05/31/15 - MD ok with giving as pt has tolerated in the past Pt can take benadryl.  . Other Itching    wool    The results of significant diagnostics from this hospitalization (including imaging, microbiology, ancillary and laboratory) are listed below for reference.       Microbiology: Recent Results (from the past 240 hour(s))  C difficile quick scan w PCR reflex     Status: Abnormal   Collection Time: 11/12/15  3:50 PM  Result Value Ref Range Status   C Diff antigen POSITIVE (A) NEGATIVE Final   C Diff toxin NEGATIVE NEGATIVE Final   C Diff interpretation Results are indeterminate. See PCR results.  Final  Clostridium Difficile by PCR     Status: Abnormal   Collection Time: 11/12/15  3:50 PM  Result Value Ref Range Status   Toxigenic C Difficile by pcr POSITIVE (A) NEGATIVE Final    Comment: Performed at [facility]: Basic Metabolic Panel:  Recent Labs Lab 11/09/15 0801 11/09/15 1346 11/12/15 0842 11/12/15 1825  NA 135 135 131* 131*  K 4.7 5.2* 4.4 4.8  CL 97* 98* 98* 95*  CO2  --  25  --  25  GLUCOSE 78 121* 103* 121*  BUN 56* 65* 51* 66*  CREATININE 9.00* 9.62* 10.70* 10.79*  CALCIUM  --  8.6*  --  8.1*  PHOS  --  8.2*  --  8.3*   Liver Function Tests:  Recent Labs Lab 11/09/15 1346 11/12/15 1825  ALBUMIN 3.1* 3.1*   CBC:  Recent Labs Lab 11/09/15 0801 11/09/15 1346 11/12/15 0842 11/12/15 1825  WBC  --  7.7  --  4.7  HGB 10.9* 8.5* 9.5* 8.2*  HCT 32.0* 26.2* 28.0* 25.1*  MCV  --  88.2  --  86.3  PLT  --  177  --  167  Principal Problem:   Fluid overload Active Problems:   Admission for dialysis [facility])   Encounter for hemodialysis for end-stage renal disease ([place])   Time coordinating discharge: 15 minutes  Signed:  Murray Hodgkins, MD [facility] 11/13/2015, 4:34 PM

## 2015-11-14 ENCOUNTER — Emergency Department (HOSPITAL_COMMUNITY)
Admission: EM | Admit: 2015-11-14 | Discharge: 2015-11-14 | Disposition: A | Discharge status: Home / Self Care | Payer: Medicare Other | Attending: Emergency Medicine | Admitting: Emergency Medicine

## 2015-11-14 ENCOUNTER — Encounter (HOSPITAL_COMMUNITY): Payer: Self-pay | Admitting: *Deleted

## 2015-11-14 DIAGNOSIS — I509 Heart failure, unspecified: Secondary | ICD-10-CM | POA: Diagnosis not present

## 2015-11-14 DIAGNOSIS — N186 End stage renal disease: Secondary | ICD-10-CM | POA: Insufficient documentation

## 2015-11-14 DIAGNOSIS — I132 Hypertensive heart and chronic kidney disease with heart failure and with stage 5 chronic kidney disease, or end stage renal disease: Secondary | ICD-10-CM | POA: Insufficient documentation

## 2015-11-14 DIAGNOSIS — Z992 Dependence on renal dialysis: Secondary | ICD-10-CM | POA: Diagnosis not present

## 2015-11-14 DIAGNOSIS — Z4931 Encounter for adequacy testing for hemodialysis: Secondary | ICD-10-CM | POA: Diagnosis present

## 2015-11-14 DIAGNOSIS — Z79899 Other long term (current) drug therapy: Secondary | ICD-10-CM | POA: Diagnosis not present

## 2015-11-14 DIAGNOSIS — D631 Anemia in chronic kidney disease: Secondary | ICD-10-CM | POA: Insufficient documentation

## 2015-11-14 DIAGNOSIS — F209 Schizophrenia, unspecified: Secondary | ICD-10-CM | POA: Insufficient documentation

## 2015-11-14 DIAGNOSIS — I5022 Chronic systolic (congestive) heart failure: Secondary | ICD-10-CM | POA: Diagnosis not present

## 2015-11-14 DIAGNOSIS — F17210 Nicotine dependence, cigarettes, uncomplicated: Secondary | ICD-10-CM | POA: Insufficient documentation

## 2015-11-14 DIAGNOSIS — I10 Essential (primary) hypertension: Secondary | ICD-10-CM | POA: Diagnosis not present

## 2015-11-14 DIAGNOSIS — I120 Hypertensive chronic kidney disease with stage 5 chronic kidney disease or end stage renal disease: Secondary | ICD-10-CM | POA: Diagnosis not present

## 2015-11-14 LAB — I-STAT CHEM 8, ED
BUN: 38 mg/dL — AB (ref 6–20)
CALCIUM ION: 1.09 mmol/L — AB (ref 1.13–1.30)
CHLORIDE: 97 mmol/L — AB (ref 101–111)
Creatinine, Ser: 7.7 mg/dL — ABNORMAL HIGH (ref 0.44–1.00)
GLUCOSE: 108 mg/dL — AB (ref 65–99)
HCT: 28 % — ABNORMAL LOW (ref 36.0–46.0)
Hemoglobin: 9.5 g/dL — ABNORMAL LOW (ref 12.0–15.0)
Potassium: 4.3 mmol/L (ref 3.5–5.1)
Sodium: 138 mmol/L (ref 135–145)
TCO2: 28 mmol/L (ref 0–100)
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
## 2015-11-14 NOTE — ED Notes (Signed)
Pt made aware to return if symptoms worsen or if any life threatening symptoms occur.   

## 2015-11-14 NOTE — Consult Note (Signed)
Sara [name] MRN: [id number] DOB/AGE: 1980-04-23 35 y.o. Primary Care Physician:Pcp Not In System Admit date: 11/14/2015 Chief Complaint:  Chief Complaint  Patient presents with  . Dialysis    HPI: Pt is 35 year old female with past medical hx of ESRD who was admitted with c/o dyspnea," I need dialysis "  HPI dates back to few days ago started feeling short of breath,and so pt came to ER asking for her dialysis. Pt says " I need my dialysis .I am full of fluid. I had my dialysis this last Monday on  11/12/15 ". No c/o fever/cough/chills NO c/o nausea/vomiting. Pt does not c/o diarrhea.pt says I have been taking my medication. No c/o hematuria     Past Medical History:  Diagnosis Date  . Acute myopericarditis    hx/notes 10/09/2014  . Bipolar disorder ([place])    [name] 10/09/2014  . CHF (congestive heart failure) ([place])    systolic/notes [id number]  . Chronic anemia    [name] 10/09/2014  . ESRD (end stage renal disease) on dialysis [facility])    "[name]; [facility]" (10/09/2014)  . History of blood transfusion    "this is probably my 3rd" (10/09/2014)  . Hypertension   . Low back pain   . 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 Non-compliant patient   . Positive ANA (antinuclear antibody) 08/16/2012  . 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 Pregnancy   . Psychosis   . Schizoaffective disorder, bipolar type ([place]) 11/20/2014  . Schizophrenia ([place])    [name] 10/09/2014  . Tobacco abuse 02/20/2014        Family History  Problem Relation Age of Onset  . Drug abuse Father   . Kidney disease Father     Social History:  reports that she has been smoking Cigarettes.  She has a 2.00 pack-year smoking history. She has never used smokeless tobacco. She reports that she does not drink alcohol or use drugs.   Allergies:  Allergies  Allergen Reactions  . Ativan [Lorazepam] Swelling and Other (See Comments)    Dysarthria(patient has difficulty speaking and slurred  speech); denies swelling, itching, pain, or numbness.  [name] [Ziprasidone Hcl] Itching and Swelling    Tongue swelling  . Keflex [Cephalexin] Swelling and Other (See Comments)    Tongue swelling. Can't talk   . Haldol [Haloperidol Lactate] Swelling    Tongue swelling. 05/31/15 - MD ok with giving as pt has tolerated in the past Pt can take benadryl.  . Other Itching    wool     (Not in a hospital admission)     [id number] from the symptoms mentioned above,there are no other symptoms referable to all systems reviewed.      Physical Exam: Vital signs in last 24 hours: Temp:  [97.9 F (36.6 C)] 97.9 F (36.6 C) (08/09 0803) Pulse Rate:  [101] 101 (08/09 0803) Resp:  [16] 16 (08/09 0803) BP: (152)/(110) 152/110 (08/09 0803) SpO2:  [100 %] 100 % (08/09 0803) Weight:  [172 lb (78 kg)] 172 lb (78 kg) (08/09 0803) Weight change:     Intake/Output from previous day: No intake/output data recorded. No intake/output data recorded.   Physical Exam: General- pt is awake,alert, follows coomands Resp- No acute REsp distress,  decreased bs at bases. CVS- S1S2 regular in rate and rhythm, NO rubs  GIT- BS+, soft, NT, ND EXT- 2+ LE Edema, NO Cyanosis CNS- CN 2-12 grossly intact. Moving all 4  extremities Access- AVF+, aneurysm present    Lab Results:  CBC    Component Value Date/Time   WBC 4.7 11/12/2015 1825   RBC 2.91 (L) 11/12/2015 1825   HGB 9.5 (L) 11/14/2015 0812   HCT 28.0 (L) 11/14/2015 0812   PLT 167 11/12/2015 1825   MCV 86.3 11/12/2015 1825   MCH 28.2 11/12/2015 1825   MCHC 32.7 11/12/2015 1825   RDW 16.7 (H) 11/12/2015 1825   LYMPHSABS 1.0 09/21/2015 0841   MONOABS 0.7 09/21/2015 0841   EOSABS 0.1 09/21/2015 0841   BASOSABS 0.0 09/21/2015 0841      BMET  Recent Labs  11/12/15 1825 11/14/15 0812  NA 131* 138  K 4.8 4.3  CL 95* 97*  CO2 25  --   GLUCOSE 121* 108*  BUN 66* 38*  CREATININE 10.79* 7.70*  CALCIUM 8.1*  --     MICRO Recent  Results (from the past 240 hour(s))  C difficile quick scan w PCR reflex     Status: Abnormal   Collection Time: 11/12/15  3:50 PM  Result Value Ref Range Status   C Diff antigen POSITIVE (A) NEGATIVE Final   C Diff toxin NEGATIVE NEGATIVE Final   C Diff interpretation Results are indeterminate. See PCR results.  Final  Clostridium Difficile by PCR     Status: Abnormal   Collection Time: 11/12/15  3:50 PM  Result Value Ref Range Status   Toxigenic C Difficile by pcr POSITIVE (A) NEGATIVE Final    Comment: Performed at [facility]      Lab Results  Component Value Date   PTH 1,530 (H) 10/15/2015   PTH Comment 10/15/2015   CALCIUM 8.1 (L) 11/12/2015   CAION 1.09 (L) 11/14/2015   PHOS 8.3 (H) 11/12/2015      Impression: 1)Renal  ESRD on HD                Pt is not on regular  Schedule sec to her complaince/adherence issues                No need to dialyze pt today  2)HTN Bp is not at goal  3)Anemia In ESRD the goal for HGb is 9--11. 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   Not at goal sec to non adherence to hd tx Phosphorus not at goal.  on binders  5)Psych .  Hx of   psycosis Schizophrenia Primary MD following  6)Electrolytes  Normokalemic  normonatremic   7)Acid base Co2  at goal   8) ID- hx of C diff       On PO vanco  Plan:  No need of HD today NO hyperkalemia NO acidosis NO shortness of breath. I again educated pt about need to contact out pt HD center. I also educated pt to go to nearest Er if chest pain/hort of breath/ not feeling well. Pt voiced understanding      Malky Rudzinski S 11/14/2015, 9:04 AM

## 2015-11-14 NOTE — ED Triage Notes (Signed)
Pt comes in for dialysis. Pt last had dialysis on Monday. Denies any pain or shortness of breath.

## 2015-11-14 NOTE — Discharge Instructions (Signed)
Return to ER as needed.

## 2015-11-16 ENCOUNTER — Encounter (HOSPITAL_COMMUNITY): Payer: Self-pay | Admitting: Emergency Medicine

## 2015-11-16 ENCOUNTER — Observation Stay (HOSPITAL_COMMUNITY)
Admission: EM | Admit: 2015-11-16 | Discharge: 2015-11-16 | Disposition: A | Discharge status: Home / Self Care | Payer: Medicare Other | Attending: Internal Medicine | Admitting: Internal Medicine

## 2015-11-16 DIAGNOSIS — F17210 Nicotine dependence, cigarettes, uncomplicated: Secondary | ICD-10-CM | POA: Diagnosis not present

## 2015-11-16 DIAGNOSIS — N189 Chronic kidney disease, unspecified: Secondary | ICD-10-CM

## 2015-11-16 DIAGNOSIS — I120 Hypertensive chronic kidney disease with stage 5 chronic kidney disease or end stage renal disease: Secondary | ICD-10-CM | POA: Diagnosis not present

## 2015-11-16 DIAGNOSIS — I10 Essential (primary) hypertension: Secondary | ICD-10-CM | POA: Diagnosis not present

## 2015-11-16 DIAGNOSIS — D631 Anemia in chronic kidney disease: Secondary | ICD-10-CM

## 2015-11-16 DIAGNOSIS — Z4931 Encounter for adequacy testing for hemodialysis: Secondary | ICD-10-CM | POA: Diagnosis present

## 2015-11-16 DIAGNOSIS — I5022 Chronic systolic (congestive) heart failure: Secondary | ICD-10-CM | POA: Diagnosis not present

## 2015-11-16 DIAGNOSIS — Z79899 Other long term (current) drug therapy: Secondary | ICD-10-CM | POA: Diagnosis not present

## 2015-11-16 DIAGNOSIS — I1311 Hypertensive heart and chronic kidney disease without heart failure, with stage 5 chronic kidney disease, or end stage renal disease: Secondary | ICD-10-CM | POA: Diagnosis not present

## 2015-11-16 DIAGNOSIS — Z992 Dependence on renal dialysis: Secondary | ICD-10-CM | POA: Insufficient documentation

## 2015-11-16 DIAGNOSIS — N186 End stage renal disease: Secondary | ICD-10-CM | POA: Diagnosis not present

## 2015-11-16 LAB — RENAL FUNCTION PANEL
ALBUMIN: 3 g/dL — AB (ref 3.5–5.0)
ANION GAP: 11 (ref 5–15)
BUN: 63 mg/dL — ABNORMAL HIGH (ref 6–20)
CALCIUM: 8.6 mg/dL — AB (ref 8.9–10.3)
CO2: 23 mmol/L (ref 22–32)
Chloride: 100 mmol/L — ABNORMAL LOW (ref 101–111)
Creatinine, Ser: 10.7 mg/dL — ABNORMAL HIGH (ref 0.44–1.00)
GFR, EST AFRICAN AMERICAN: 5 mL/min — AB (ref >60)
GFR, EST NON AFRICAN AMERICAN: 4 mL/min — AB (ref >60)
Glucose, Bld: 84 mg/dL (ref 65–99)
PHOSPHORUS: 7.2 mg/dL — AB (ref 2.5–4.6)
Potassium: 5.4 mmol/L — ABNORMAL HIGH (ref 3.5–5.1)
SODIUM: 134 mmol/L — AB (ref 135–145)

## 2015-11-16 LAB — CBC
HCT: 25.2 % — ABNORMAL LOW (ref 36.0–46.0)
HEMOGLOBIN: 8.3 g/dL — AB (ref 12.0–15.0)
MCH: 28.4 pg (ref 26.0–34.0)
MCHC: 32.9 g/dL (ref 30.0–36.0)
MCV: 86.3 fL (ref 78.0–100.0)
Platelets: 210 10*3/uL (ref 150–400)
RBC: 2.92 MIL/uL — AB (ref 3.87–5.11)
RDW: 16.8 % — ABNORMAL HIGH (ref 11.5–15.5)
WBC: 7.6 10*3/uL (ref 4.0–10.5)

## 2015-11-16 LAB — HEPATIC FUNCTION PANEL
ALBUMIN: 3 g/dL — AB (ref 3.5–5.0)
ALK PHOS: 94 U/L (ref 38–126)
ALT: 17 U/L (ref 14–54)
AST: 30 U/L (ref 15–41)
BILIRUBIN TOTAL: 0.7 mg/dL (ref 0.3–1.2)
Bilirubin, Direct: 0.2 mg/dL (ref 0.1–0.5)
Indirect Bilirubin: 0.5 mg/dL (ref 0.3–0.9)
Total Protein: 6.4 g/dL — ABNORMAL LOW (ref 6.5–8.1)

## 2015-11-16 MED ORDER — METOPROLOL TARTRATE 50 MG PO TABS: 50.0000 mg | ORAL_TABLET | Freq: Two times a day (BID) | ORAL | Status: DC

## 2015-11-16 MED ORDER — SEVELAMER CARBONATE 800 MG PO TABS
2400.0000 mg | ORAL_TABLET | Freq: Three times a day (TID) | ORAL | Status: DC
  Administered 2015-11-16: 2400 mg via ORAL
  Filled 2015-11-16 (×11): qty 3

## 2015-11-16 MED ORDER — ACETAMINOPHEN 325 MG PO TABS: 650.0000 mg | ORAL_TABLET | Freq: Four times a day (QID) | ORAL | Status: DC | PRN

## 2015-11-16 MED ORDER — SODIUM CHLORIDE 0.9 % IV SOLN: 100.0000 mL | INTRAVENOUS | Status: DC | PRN

## 2015-11-16 MED ORDER — TRAMADOL HCL 50 MG PO TABS
50.0000 mg | ORAL_TABLET | Freq: Two times a day (BID) | ORAL | Status: DC | PRN
  Administered 2015-11-16: 50 mg via ORAL

## 2015-11-16 MED ORDER — EPOETIN ALFA 10000 UNIT/ML IJ SOLN
INTRAMUSCULAR | Status: AC
  Administered 2015-11-16: 10000 [IU] via INTRAVENOUS
  Filled 2015-11-16: qty 1

## 2015-11-16 MED ORDER — LIDOCAINE HCL (PF) 1 % IJ SOLN
5.0000 mL | INTRAMUSCULAR | Status: DC | PRN
  Administered 2015-11-16: 0.5 mL via INTRADERMAL

## 2015-11-16 MED ORDER — LIDOCAINE HCL (PF) 1 % IJ SOLN
INTRAMUSCULAR | Status: AC
  Administered 2015-11-16: 0.5 mL via INTRADERMAL
  Filled 2015-11-16: qty 5

## 2015-11-16 MED ORDER — HEPARIN SODIUM (PORCINE) 1000 UNIT/ML IJ SOLN
INTRAMUSCULAR | Status: AC
  Administered 2015-11-16: 1500 [IU] via INTRAVENOUS_CENTRAL
  Filled 2015-11-16: qty 6

## 2015-11-16 MED ORDER — SEVELAMER CARBONATE 800 MG PO TABS
3200.0000 mg | ORAL_TABLET | Freq: Three times a day (TID) | ORAL | Status: DC
  Administered 2015-11-16: 3200 mg via ORAL
  Filled 2015-11-16: qty 4

## 2015-11-16 MED ORDER — EPOETIN ALFA 10000 UNIT/ML IJ SOLN
10000.0000 [IU] | INTRAMUSCULAR | Status: DC
  Administered 2015-11-16: 10000 [IU] via INTRAVENOUS
  Filled 2015-11-16 (×2): qty 1

## 2015-11-16 MED ORDER — ACETAMINOPHEN 650 MG RE SUPP: 650.0000 mg | Freq: Four times a day (QID) | RECTAL | Status: DC | PRN

## 2015-11-16 MED ORDER — HEPARIN SODIUM (PORCINE) 1000 UNIT/ML DIALYSIS
20.0000 [IU]/kg | INTRAMUSCULAR | Status: DC | PRN
  Administered 2015-11-16: 1500 [IU] via INTRAVENOUS_CENTRAL
  Filled 2015-11-16: qty 2

## 2015-11-16 MED ORDER — TUBERCULIN PPD 5 UNIT/0.1ML ID SOLN
5.0000 [IU] | Freq: Once | INTRADERMAL | Status: DC
  Administered 2015-11-16: 5 [IU] via INTRADERMAL
  Filled 2015-11-16: qty 0.1

## 2015-11-16 MED ORDER — PREDNISONE 20 MG PO TABS: 20.0000 mg | ORAL_TABLET | Freq: Every day | ORAL | Status: DC

## 2015-11-16 MED ORDER — ACETAMINOPHEN 500 MG PO TABS: 1000.0000 mg | ORAL_TABLET | Freq: Four times a day (QID) | ORAL | Status: DC | PRN

## 2015-11-16 MED ORDER — DIPHENHYDRAMINE HCL 25 MG PO CAPS
25.0000 mg | ORAL_CAPSULE | Freq: Four times a day (QID) | ORAL | Status: DC | PRN
  Administered 2015-11-16: 25 mg via ORAL

## 2015-11-16 MED ORDER — DIPHENHYDRAMINE HCL 25 MG PO CAPS
ORAL_CAPSULE | ORAL | Status: AC
  Administered 2015-11-16: 25 mg via ORAL
  Filled 2015-11-16: qty 1

## 2015-11-16 MED ORDER — FUROSEMIDE 80 MG PO TABS: 120.0000 mg | ORAL_TABLET | Freq: Every day | ORAL | Status: DC

## 2015-11-16 MED ORDER — TRAMADOL HCL 50 MG PO TABS: 50.0000 mg | ORAL_TABLET | Freq: Four times a day (QID) | ORAL | Status: DC | PRN

## 2015-11-16 MED ORDER — PENTAFLUOROPROP-TETRAFLUOROETH EX AERO: 1.0000 "application " | INHALATION_SPRAY | CUTANEOUS | Status: DC | PRN

## 2015-11-16 MED ORDER — TRAMADOL HCL 50 MG PO TABS
ORAL_TABLET | ORAL | Status: AC
  Administered 2015-11-16: 50 mg via ORAL
  Filled 2015-11-16: qty 1

## 2015-11-16 NOTE — ED Notes (Signed)
Floor unable to take report at this time.

## 2015-11-16 NOTE — Procedures (Signed)
   HEMODIALYSIS TREATMENT NOTE:  4.5 hour low-heparin dialysis completed via right upper arm aVF  (15g ante/retrograde). Goal met: 4 liters removed without interruption in ultrafiltration. All blood was returned and hemostasis was achieved within 15 minutes. Report given to Tacey Heap, RN.  Pt verbalized understanding of need to have PPD (placed this afternoon--L forearm) read within the next 48-72 hours.  She plans on returned to ED 8/14 to request inpatient dialysis.  Rockwell Alexandria, RN, CDN.

## 2015-11-16 NOTE — Clinical Social Work Note (Addendum)
CSW met with pt and discussed that several outpatient clinics would be contacted regarding setting up dialysis. Pt is happy to hear this and states she has transportation. Paperwork was signed for clinics by pt. PPD requested. Will continue to follow.  Benay Pike, [place]
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
## 2015-11-16 NOTE — Discharge Summary (Signed)
Physician Discharge Summary  Sara [name] [name] DOB: 04-May-1980 DOA: 11/16/2015  PCP: Pcp Not In System  Admit date: 11/16/2015 Discharge date: 11/16/2015  Admitted From: home Disposition:  home  Recommendations for Outpatient Follow-up:  1. Follow up with PCP in 1-2 weeks 2. Patient is being set up with outpatient dialysis center. Until that is set up, she will likely return to hospital for dialysis  Home Health: Equipment/Devices:  Discharge Condition: stable CODE STATUS: full Diet recommendation: Heart Healthy    Brief/Interim Summary: Patient presented to the hospital for regular dialysis. She denied any shortness of breath or chest pain. Noted to be mildly hyperkalemic. She underwent hemodialysis without any issues. Currently, social work is in process to set the patient up with her outpatient dialysis center. Patient was continued on her regular medications. After dialysis, she was feeling significantly improved and requested discharge home.  Discharge Diagnoses:  Active Problems:   ESRD on hemodialysis (HCC)   ESRD (end stage renal disease) ([place])   Essential HTN   Anemia in chronic kidney disease   Discharge Instructions  Discharge Instructions    Diet - low sodium heart healthy    Complete by:  As directed   Increase activity slowly    Complete by:  As directed       Medication List    TAKE these medications   acetaminophen 500 MG tablet Commonly known as:  TYLENOL Take 1,000 mg by mouth every 6 (six) hours as needed for moderate pain. Reported on 06/12/2015   furosemide 40 MG tablet Commonly known as:  LASIX Take 3 tablets (120 mg total) by mouth daily.   metoprolol 50 MG tablet Commonly known as:  LOPRESSOR Take 1 tablet (50 mg total) by mouth 2 (two) times daily.   predniSONE 20 MG tablet Commonly known as:  DELTASONE Take 1 tablet (20 mg total) by mouth daily with breakfast.   risperiDONE microspheres 25 MG injection Commonly known as:   RISPERDAL CONSTA Inject 25 mg into the muscle every 14 (fourteen) days.   sevelamer carbonate 800 MG tablet Commonly known as:  RENVELA Take 4 tablets (3,200 mg total) by mouth 3 (three) times daily with meals.   traMADol 50 MG tablet Commonly known as:  ULTRAM Take 1 tablet (50 mg total) by mouth every 6 (six) hours as needed for moderate pain.       Allergies  Allergen Reactions  . Ativan [Lorazepam] Swelling and Other (See Comments)    Dysarthria(patient has difficulty speaking and slurred speech); denies swelling, itching, pain, or numbness.  [name] [Ziprasidone Hcl] Itching and Swelling    Tongue swelling  . Keflex [Cephalexin] Swelling and Other (See Comments)    Tongue swelling. Can't talk   . Haldol [Haloperidol Lactate] Swelling    Tongue swelling. 05/31/15 - MD ok with giving as pt has tolerated in the past Pt can take benadryl.  . Other Itching    wool    Consultations:  Nephrology   Procedures/Studies: Dg Chest 2 View  Result Date: 10/29/2015 CLINICAL DATA:  Shortness of breath.  Chronic renal failure EXAM: CHEST  2 VIEW COMPARISON:  September 28, 2015 FINDINGS: There is no edema or consolidation. There is cardiomegaly with pulmonary vascularity within normal limits. No adenopathy. There is erosive change in the distal clavicles with extensive bony hypertrophy in these areas, stable. IMPRESSION: Persistent cardiomegaly. No edema or consolidation. Changes in the distal clavicles most likely are due to secondary hyperparathyroidism associated with chronic renal failure.  Electronically Signed   By: Lowella Grip III M.D.   On: 10/29/2015 09:56  Dg Finger Thumb Left  Result Date: 11/02/2015 CLINICAL DATA:  Left thumb pain. EXAM: LEFT THUMB 2+V COMPARISON:  None. FINDINGS: There is no evidence of fracture or dislocation. There is no evidence of arthropathy or other focal bone abnormality. Soft tissues demonstrate diffuse edema. IMPRESSION: No acute fracture or  dislocation identified about the left thumb. Diffuse soft tissue edema. Electronically Signed   By: Fidela Salisbury M.D.   On: 11/02/2015 08:54       Subjective: No shortness of breath or chest pain  Discharge Exam: Vitals:   11/16/15 1545 11/16/15 1615  BP: 140/77 139/79  Pulse: 87 82  Resp:    Temp:     Vitals:   11/16/15 1445 11/16/15 1515 11/16/15 1545 11/16/15 1615  BP: (!) 133/92 (!) 141/76 140/77 139/79  Pulse: 87 88 87 82  Resp:      Temp:      TempSrc:      SpO2:      Weight:      Height:        General: Pt is alert, awake, not in acute distress Cardiovascular: RRR, S1/S2 +, no rubs, no gallops Respiratory: CTA bilaterally, no wheezing, no rhonchi Abdominal: Soft, NT, ND, bowel sounds + Extremities: 1+ edema, no cyanosis    The results of significant diagnostics from this hospitalization (including imaging, microbiology, ancillary and laboratory) are listed below for reference.     Microbiology: Recent Results (from the past 240 hour(s))  C difficile quick scan w PCR reflex     Status: Abnormal   Collection Time: 11/12/15  3:50 PM  Result Value Ref Range Status   C Diff antigen POSITIVE (A) NEGATIVE Final   C Diff toxin NEGATIVE NEGATIVE Final   C Diff interpretation Results are indeterminate. See PCR results.  Final  Clostridium Difficile by PCR     Status: Abnormal   Collection Time: 11/12/15  3:50 PM  Result Value Ref Range Status   Toxigenic C Difficile by pcr POSITIVE (A) NEGATIVE Final    Comment: Performed at [facility]: BNP (last 3 results) No results for input(s): BNP in the last 8760 hours. Basic Metabolic Panel:  Recent Labs Lab 11/12/15 0842 11/12/15 1825 11/14/15 0812 11/16/15 1353  NA 131* 131* 138 134*  K 4.4 4.8 4.3 5.4*  CL 98* 95* 97* 100*  CO2  --  25  --  23  GLUCOSE 103* 121* 108* 84  BUN 51* 66* 38* 63*  CREATININE 10.70* 10.79* 7.70* 10.70*  CALCIUM  --  8.1*  --  8.6*  PHOS  --  8.3*  --   7.2*   Liver Function Tests:  Recent Labs Lab 11/12/15 1825 11/16/15 1353  AST  --  30  ALT  --  17  ALKPHOS  --  94  BILITOT  --  0.7  PROT  --  6.4*  ALBUMIN 3.1* 3.0*  3.0*   No results for input(s): LIPASE, AMYLASE in the last 168 hours. No results for input(s): AMMONIA in the last 168 hours. CBC:  Recent Labs Lab 11/12/15 0842 11/12/15 1825 11/14/15 0812 11/16/15 1353  WBC  --  4.7  --  7.6  HGB 9.5* 8.2* 9.5* 8.3*  HCT 28.0* 25.1* 28.0* 25.2*  MCV  --  86.3  --  86.3  PLT  --  167  --  210  Cardiac Enzymes: No results for input(s): CKTOTAL, CKMB, CKMBINDEX, TROPONINI in the last 168 hours. BNP: Invalid input(s): POCBNP CBG: No results for input(s): GLUCAP in the last 168 hours. D-Dimer No results for input(s): DDIMER in the last 72 hours. Hgb A1c No results for input(s): HGBA1C in the last 72 hours. Lipid Profile No results for input(s): CHOL, HDL, LDLCALC, TRIG, CHOLHDL, LDLDIRECT in the last 72 hours. Thyroid function studies No results for input(s): TSH, T4TOTAL, T3FREE, THYROIDAB in the last 72 hours.  Invalid input(s): FREET3 Anemia work up No results for input(s): VITAMINB12, FOLATE, FERRITIN, TIBC, IRON, RETICCTPCT in the last 72 hours. Urinalysis    Component Value Date/Time   COLORURINE YELLOW 08/11/2015 1235   APPEARANCEUR HAZY (A) 08/11/2015 1235   LABSPEC 1.015 08/11/2015 1235   PHURINE 7.5 08/11/2015 1235   GLUCOSEU 100 (A) 08/11/2015 1235   HGBUR SMALL (A) 08/11/2015 1235   BILIRUBINUR NEGATIVE 08/11/2015 1235   KETONESUR NEGATIVE 08/11/2015 1235   PROTEINUR >300 (A) 08/11/2015 1235   UROBILINOGEN 0.2 01/09/2015 1645   NITRITE NEGATIVE 08/11/2015 1235   LEUKOCYTESUR SMALL (A) 08/11/2015 1235   Sepsis Labs Invalid input(s): PROCALCITONIN,  WBC,  LACTICIDVEN Microbiology Recent Results (from the past 240 hour(s))  C difficile quick scan w PCR reflex     Status: Abnormal   Collection Time: 11/12/15  3:50 PM  Result Value Ref  Range Status   C Diff antigen POSITIVE (A) NEGATIVE Final   C Diff toxin NEGATIVE NEGATIVE Final   C Diff interpretation Results are indeterminate. See PCR results.  Final  Clostridium Difficile by PCR     Status: Abnormal   Collection Time: 11/12/15  3:50 PM  Result Value Ref Range Status   Toxigenic C Difficile by pcr POSITIVE (A) NEGATIVE Final    Comment: Performed at [facility]     Time coordinating discharge: Over 30 minutes  SIGNED:   Kathie Dike, MD  [facility] 11/16/2015, 5:17 PM Pager   If 7PM-7AM, please contact night-coverage [email or web address] Password [name]

## 2015-11-16 NOTE — Progress Notes (Signed)
Pt back from HD, reviewed discharge instructions with pt and answered questions at this time.

## 2015-11-16 NOTE — ED Triage Notes (Signed)
Pt requesting dialysis today, c/o of left shoulder pain from sleeping wrong last night.

## 2015-11-19 ENCOUNTER — Observation Stay (HOSPITAL_COMMUNITY)
Admission: EM | Admit: 2015-11-19 | Discharge: 2015-11-19 | Disposition: A | Discharge status: Home / Self Care | Payer: Medicare Other | Attending: Internal Medicine | Admitting: Internal Medicine

## 2015-11-19 ENCOUNTER — Encounter (HOSPITAL_COMMUNITY): Payer: Self-pay

## 2015-11-19 DIAGNOSIS — F17210 Nicotine dependence, cigarettes, uncomplicated: Secondary | ICD-10-CM | POA: Diagnosis not present

## 2015-11-19 DIAGNOSIS — R0602 Shortness of breath: Secondary | ICD-10-CM | POA: Diagnosis not present

## 2015-11-19 DIAGNOSIS — I1311 Hypertensive heart and chronic kidney disease without heart failure, with stage 5 chronic kidney disease, or end stage renal disease: Secondary | ICD-10-CM | POA: Diagnosis not present

## 2015-11-19 DIAGNOSIS — Z79899 Other long term (current) drug therapy: Secondary | ICD-10-CM | POA: Diagnosis not present

## 2015-11-19 DIAGNOSIS — N186 End stage renal disease: Secondary | ICD-10-CM | POA: Diagnosis not present

## 2015-11-19 DIAGNOSIS — I5022 Chronic systolic (congestive) heart failure: Secondary | ICD-10-CM | POA: Diagnosis not present

## 2015-11-19 DIAGNOSIS — I120 Hypertensive chronic kidney disease with stage 5 chronic kidney disease or end stage renal disease: Secondary | ICD-10-CM | POA: Diagnosis not present

## 2015-11-19 DIAGNOSIS — Z992 Dependence on renal dialysis: Secondary | ICD-10-CM | POA: Diagnosis present

## 2015-11-19 LAB — CBC
HCT: 26.1 % — ABNORMAL LOW (ref 36.0–46.0)
HEMOGLOBIN: 8.3 g/dL — AB (ref 12.0–15.0)
MCH: 27.5 pg (ref 26.0–34.0)
MCHC: 31.8 g/dL (ref 30.0–36.0)
MCV: 86.4 fL (ref 78.0–100.0)
Platelets: 280 10*3/uL (ref 150–400)
RBC: 3.02 MIL/uL — ABNORMAL LOW (ref 3.87–5.11)
RDW: 17.1 % — ABNORMAL HIGH (ref 11.5–15.5)
WBC: 8.7 10*3/uL (ref 4.0–10.5)

## 2015-11-19 LAB — RENAL FUNCTION PANEL
ANION GAP: 10 (ref 5–15)
Albumin: 3.1 g/dL — ABNORMAL LOW (ref 3.5–5.0)
BUN: 67 mg/dL — ABNORMAL HIGH (ref 6–20)
CALCIUM: 9 mg/dL (ref 8.9–10.3)
CO2: 27 mmol/L (ref 22–32)
Chloride: 98 mmol/L — ABNORMAL LOW (ref 101–111)
Creatinine, Ser: 10.25 mg/dL — ABNORMAL HIGH (ref 0.44–1.00)
GFR calc Af Amer: 5 mL/min — ABNORMAL LOW (ref >60)
GFR calc non Af Amer: 4 mL/min — ABNORMAL LOW (ref >60)
GLUCOSE: 90 mg/dL (ref 65–99)
Phosphorus: 6.7 mg/dL — ABNORMAL HIGH (ref 2.5–4.6)
Potassium: 5.2 mmol/L — ABNORMAL HIGH (ref 3.5–5.1)
SODIUM: 135 mmol/L (ref 135–145)

## 2015-11-19 LAB — I-STAT CHEM 8, ED
BUN: 57 mg/dL — AB (ref 6–20)
CALCIUM ION: 1.15 mmol/L (ref 1.13–1.30)
CREATININE: 9.5 mg/dL — AB (ref 0.44–1.00)
Chloride: 98 mmol/L — ABNORMAL LOW (ref 101–111)
GLUCOSE: 109 mg/dL — AB (ref 65–99)
HCT: 30 % — ABNORMAL LOW (ref 36.0–46.0)
HEMOGLOBIN: 10.2 g/dL — AB (ref 12.0–15.0)
Potassium: 4.9 mmol/L (ref 3.5–5.1)
Sodium: 136 mmol/L (ref 135–145)
TCO2: 26 mmol/L (ref 0–100)

## 2015-11-19 LAB — PHOSPHORUS: PHOSPHORUS: 6.8 mg/dL — AB (ref 2.5–4.6)

## 2015-11-19 LAB — I-STAT BETA HCG BLOOD, ED (MC, WL, AP ONLY): I-stat hCG, quantitative: 8.3 m[IU]/mL — ABNORMAL HIGH (ref <5)

## 2015-11-19 MED ORDER — SEVELAMER CARBONATE 800 MG PO TABS
ORAL_TABLET | ORAL | Status: AC
  Administered 2015-11-19: 3200 mg via ORAL
  Filled 2015-11-19: qty 4

## 2015-11-19 MED ORDER — EPOETIN ALFA 10000 UNIT/ML IJ SOLN
INTRAMUSCULAR | Status: AC
  Administered 2015-11-19: 10000 [IU] via INTRAVENOUS
  Filled 2015-11-19: qty 1

## 2015-11-19 MED ORDER — SODIUM CHLORIDE 0.9 % IV SOLN: 100.0000 mL | INTRAVENOUS | Status: DC | PRN

## 2015-11-19 MED ORDER — DIPHENHYDRAMINE HCL 25 MG PO CAPS
ORAL_CAPSULE | ORAL | Status: AC
  Administered 2015-11-19: 25 mg via ORAL
  Filled 2015-11-19: qty 1

## 2015-11-19 MED ORDER — PENTAFLUOROPROP-TETRAFLUOROETH EX AERO: 1.0000 "application " | INHALATION_SPRAY | CUTANEOUS | Status: DC | PRN

## 2015-11-19 MED ORDER — HEPARIN SODIUM (PORCINE) 1000 UNIT/ML IJ SOLN
INTRAMUSCULAR | Status: AC
  Administered 2015-11-19: 1500 [IU] via INTRAVENOUS_CENTRAL
  Filled 2015-11-19: qty 5

## 2015-11-19 MED ORDER — DIPHENHYDRAMINE HCL 25 MG PO CAPS
25.0000 mg | ORAL_CAPSULE | Freq: Four times a day (QID) | ORAL | Status: DC | PRN
  Administered 2015-11-19: 25 mg via ORAL

## 2015-11-19 MED ORDER — EPOETIN ALFA 10000 UNIT/ML IJ SOLN
10000.0000 [IU] | INTRAMUSCULAR | Status: DC
  Administered 2015-11-19: 10000 [IU] via INTRAVENOUS

## 2015-11-19 MED ORDER — TRAMADOL HCL 50 MG PO TABS
50.0000 mg | ORAL_TABLET | Freq: Two times a day (BID) | ORAL | Status: DC | PRN
  Administered 2015-11-19: 50 mg via ORAL

## 2015-11-19 MED ORDER — LIDOCAINE HCL (PF) 1 % IJ SOLN
INTRAMUSCULAR | Status: AC
  Administered 2015-11-19: 0.5 mL via INTRADERMAL
  Filled 2015-11-19: qty 5

## 2015-11-19 MED ORDER — TRAMADOL HCL 50 MG PO TABS
ORAL_TABLET | ORAL | Status: AC
  Administered 2015-11-19: 50 mg via ORAL
  Filled 2015-11-19: qty 1

## 2015-11-19 MED ORDER — HEPARIN SODIUM (PORCINE) 1000 UNIT/ML DIALYSIS
20.0000 [IU]/kg | INTRAMUSCULAR | Status: DC | PRN
  Administered 2015-11-19: 1500 [IU] via INTRAVENOUS_CENTRAL

## 2015-11-19 MED ORDER — SEVELAMER CARBONATE 800 MG PO TABS
3200.0000 mg | ORAL_TABLET | Freq: Three times a day (TID) | ORAL | Status: DC
  Administered 2015-11-19: 3200 mg via ORAL
  Filled 2015-11-19 (×7): qty 4

## 2015-11-19 MED ORDER — LIDOCAINE HCL (PF) 1 % IJ SOLN
5.0000 mL | INTRAMUSCULAR | Status: DC | PRN
  Administered 2015-11-19: 0.5 mL via INTRADERMAL

## 2015-11-19 MED ORDER — LIDOCAINE-PRILOCAINE 2.5-2.5 % EX CREA: 1.0000 "application " | TOPICAL_CREAM | CUTANEOUS | Status: DC | PRN
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
## 2015-11-19 NOTE — Discharge Summary (Signed)
Patient left AMA after DC without waiting for assessment from MD.  Domingo Mend, MD [facility] Pager: [phone]
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
## 2015-11-19 NOTE — ED Notes (Signed)
PPD skin test given on 11/16/15 to left forearm. PPD skin test read negative today to inner left forearm with 67mm induration and no redness noted.

## 2015-11-19 NOTE — ED Notes (Signed)
Reported called to [facility], RN for room 323 at this time.

## 2015-11-19 NOTE — ED Triage Notes (Signed)
Pt here for dialysis, last dialysis was Friday.  Reports abd swollen, denies pain.

## 2015-11-19 NOTE — ED Notes (Signed)
PA at bedside.

## 2015-11-20 LAB — PTH, INTACT AND CALCIUM
CALCIUM TOTAL (PTH): 9.2 mg/dL (ref 8.7–10.2)
PTH: 1100 pg/mL — AB (ref 15–65)

## 2015-11-21 ENCOUNTER — Observation Stay (HOSPITAL_COMMUNITY)
Admission: EM | Admit: 2015-11-21 | Discharge: 2015-11-21 | Disposition: A | Discharge status: Home / Self Care | Payer: Medicare Other | Attending: Internal Medicine | Admitting: Internal Medicine

## 2015-11-21 ENCOUNTER — Encounter (HOSPITAL_COMMUNITY): Payer: Self-pay | Admitting: Emergency Medicine

## 2015-11-21 DIAGNOSIS — F250 Schizoaffective disorder, bipolar type: Secondary | ICD-10-CM | POA: Diagnosis present

## 2015-11-21 DIAGNOSIS — I132 Hypertensive heart and chronic kidney disease with heart failure and with stage 5 chronic kidney disease, or end stage renal disease: Secondary | ICD-10-CM | POA: Diagnosis not present

## 2015-11-21 DIAGNOSIS — I10 Essential (primary) hypertension: Secondary | ICD-10-CM | POA: Diagnosis not present

## 2015-11-21 DIAGNOSIS — I120 Hypertensive chronic kidney disease with stage 5 chronic kidney disease or end stage renal disease: Secondary | ICD-10-CM | POA: Diagnosis not present

## 2015-11-21 DIAGNOSIS — D631 Anemia in chronic kidney disease: Secondary | ICD-10-CM | POA: Diagnosis not present

## 2015-11-21 DIAGNOSIS — I5022 Chronic systolic (congestive) heart failure: Secondary | ICD-10-CM | POA: Insufficient documentation

## 2015-11-21 DIAGNOSIS — Z9119 Patient's noncompliance with other medical treatment and regimen: Secondary | ICD-10-CM

## 2015-11-21 DIAGNOSIS — Z79899 Other long term (current) drug therapy: Secondary | ICD-10-CM | POA: Diagnosis not present

## 2015-11-21 DIAGNOSIS — F17210 Nicotine dependence, cigarettes, uncomplicated: Secondary | ICD-10-CM | POA: Diagnosis not present

## 2015-11-21 DIAGNOSIS — Z992 Dependence on renal dialysis: Secondary | ICD-10-CM | POA: Insufficient documentation

## 2015-11-21 DIAGNOSIS — N186 End stage renal disease: Secondary | ICD-10-CM | POA: Insufficient documentation

## 2015-11-21 DIAGNOSIS — E8770 Fluid overload, unspecified: Secondary | ICD-10-CM | POA: Diagnosis present

## 2015-11-21 DIAGNOSIS — Z91199 Patient's noncompliance with other medical treatment and regimen due to unspecified reason: Secondary | ICD-10-CM

## 2015-11-21 DIAGNOSIS — R0602 Shortness of breath: Secondary | ICD-10-CM | POA: Diagnosis present

## 2015-11-21 DIAGNOSIS — M329 Systemic lupus erythematosus, unspecified: Secondary | ICD-10-CM | POA: Diagnosis present

## 2015-11-21 LAB — CBC
HCT: 26.8 % — ABNORMAL LOW (ref 36.0–46.0)
Hemoglobin: 8.5 g/dL — ABNORMAL LOW (ref 12.0–15.0)
MCH: 27.7 pg (ref 26.0–34.0)
MCHC: 31.7 g/dL (ref 30.0–36.0)
MCV: 87.3 fL (ref 78.0–100.0)
PLATELETS: 289 10*3/uL (ref 150–400)
RBC: 3.07 MIL/uL — ABNORMAL LOW (ref 3.87–5.11)
RDW: 17 % — ABNORMAL HIGH (ref 11.5–15.5)
WBC: 6.9 10*3/uL (ref 4.0–10.5)

## 2015-11-21 LAB — CREATININE, SERUM
CREATININE: 9.03 mg/dL — AB (ref 0.44–1.00)
GFR calc non Af Amer: 5 mL/min — ABNORMAL LOW (ref >60)
GFR, EST AFRICAN AMERICAN: 6 mL/min — AB (ref >60)

## 2015-11-21 MED ORDER — RISPERIDONE MICROSPHERES 25 MG IM SUSR: 25.0000 mg | INTRAMUSCULAR | Status: DC

## 2015-11-21 MED ORDER — HEPARIN SODIUM (PORCINE) 1000 UNIT/ML DIALYSIS
20.0000 [IU]/kg | INTRAMUSCULAR | Status: DC | PRN
  Administered 2015-11-21: 1600 [IU] via INTRAVENOUS_CENTRAL

## 2015-11-21 MED ORDER — ACETAMINOPHEN 650 MG RE SUPP: 650.0000 mg | Freq: Four times a day (QID) | RECTAL | Status: DC | PRN

## 2015-11-21 MED ORDER — ACETAMINOPHEN 325 MG PO TABS: 650.0000 mg | ORAL_TABLET | Freq: Four times a day (QID) | ORAL | Status: DC | PRN

## 2015-11-21 MED ORDER — SEVELAMER CARBONATE 800 MG PO TABS
3200.0000 mg | ORAL_TABLET | Freq: Three times a day (TID) | ORAL | Status: DC
  Filled 2015-11-21: qty 4

## 2015-11-21 MED ORDER — VANCOMYCIN 50 MG/ML ORAL SOLUTION
125.0000 mg | Freq: Four times a day (QID) | ORAL | Status: DC
  Filled 2015-11-21 (×10): qty 1

## 2015-11-21 MED ORDER — SODIUM CHLORIDE 0.9 % IV SOLN
100.0000 mL | INTRAVENOUS | Status: DC | PRN
Start: 2015-11-21 — End: 2015-11-21

## 2015-11-21 MED ORDER — LIDOCAINE-PRILOCAINE 2.5-2.5 % EX CREA
1.0000 | TOPICAL_CREAM | CUTANEOUS | Status: DC | PRN
Start: 2015-11-21 — End: 2015-11-21

## 2015-11-21 MED ORDER — EPOETIN ALFA 4000 UNIT/ML IJ SOLN
4000.0000 [IU] | INTRAMUSCULAR | Status: DC
  Administered 2015-11-21: 4000 [IU] via INTRAVENOUS

## 2015-11-21 MED ORDER — FUROSEMIDE 80 MG PO TABS: 120.0000 mg | ORAL_TABLET | Freq: Every day | ORAL | Status: DC

## 2015-11-21 MED ORDER — TRAMADOL HCL 50 MG PO TABS
ORAL_TABLET | ORAL | Status: AC
  Administered 2015-11-21: 50 mg via ORAL
  Filled 2015-11-21: qty 1

## 2015-11-21 MED ORDER — LIDOCAINE HCL (PF) 1 % IJ SOLN
5.0000 mL | INTRAMUSCULAR | Status: DC | PRN
  Administered 2015-11-21: 0.5 mL via INTRADERMAL

## 2015-11-21 MED ORDER — EPOETIN ALFA 4000 UNIT/ML IJ SOLN: 4000.0000 [IU] | INTRAMUSCULAR | Status: DC

## 2015-11-21 MED ORDER — LIDOCAINE HCL (PF) 1 % IJ SOLN
INTRAMUSCULAR | Status: AC
  Administered 2015-11-21: 0.5 mL via INTRADERMAL
  Filled 2015-11-21: qty 5

## 2015-11-21 MED ORDER — DIPHENHYDRAMINE HCL 25 MG PO CAPS
ORAL_CAPSULE | ORAL | Status: AC
  Filled 2015-11-21: qty 1

## 2015-11-21 MED ORDER — PENTAFLUOROPROP-TETRAFLUOROETH EX AERO: 1.0000 "application " | INHALATION_SPRAY | CUTANEOUS | Status: DC | PRN

## 2015-11-21 MED ORDER — HEPARIN SODIUM (PORCINE) 1000 UNIT/ML IJ SOLN
INTRAMUSCULAR | Status: AC
Start: 2015-11-21 — End: 2015-11-21
  Administered 2015-11-21: 1600 [IU] via INTRAVENOUS_CENTRAL
  Filled 2015-11-21: qty 4

## 2015-11-21 MED ORDER — PREDNISONE 20 MG PO TABS
20.0000 mg | ORAL_TABLET | Freq: Every day | ORAL | Status: DC
  Filled 2015-11-21: qty 1

## 2015-11-21 MED ORDER — HEPARIN SODIUM (PORCINE) 5000 UNIT/ML IJ SOLN: 5000.0000 [IU] | Freq: Three times a day (TID) | INTRAMUSCULAR | Status: DC

## 2015-11-21 MED ORDER — SODIUM CHLORIDE 0.9 % IV SOLN: 100.0000 mL | INTRAVENOUS | Status: DC | PRN

## 2015-11-21 MED ORDER — METOPROLOL TARTRATE 50 MG PO TABS: 50.0000 mg | ORAL_TABLET | Freq: Two times a day (BID) | ORAL | Status: DC

## 2015-11-21 MED ORDER — EPOETIN ALFA 4000 UNIT/ML IJ SOLN
INTRAMUSCULAR | Status: AC
  Administered 2015-11-21: 4000 [IU] via INTRAVENOUS
  Filled 2015-11-21: qty 1

## 2015-11-21 MED ORDER — TRAMADOL HCL 50 MG PO TABS
50.0000 mg | ORAL_TABLET | Freq: Two times a day (BID) | ORAL | Status: DC | PRN
  Administered 2015-11-21: 50 mg via ORAL

## 2015-11-21 NOTE — ED Notes (Signed)
RN unable to take report at this time 

## 2015-11-21 NOTE — ED Triage Notes (Signed)
Pt arrived for dialysis.
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
## 2015-11-21 NOTE — Consult Note (Addendum)
Sara [name] MRN: [id number] DOB/AGE: 1981/01/14 35 y.o. Primary Care Physician:Pcp Not In System Admit date: 11/21/2015 Chief Complaint:  Chief Complaint  Patient presents with  . Shortness of Breath   HPI: Pt is 35 year old female with past medical hx of ESRD who was admitted with c/o dyspnea," I need dialysis "  HPI dates back to few days ago started feeling short of breath,and so pt came to ER asking for her dialysis. Pt says " I need my dialysis .I am full of fluid. I had my dialysis this last Monday on  11/19/15 ". No c/o fever/cough/chills NO c/o nausea/vomiting. Pt does not c/o diarrhea.pt says I have been taking my medication. No c/o hematuria     Past Medical History:  Diagnosis Date  . Acute myopericarditis    hx/notes 10/09/2014  . Bipolar disorder ([place])    [name] 10/09/2014  . CHF (congestive heart failure) ([place])    systolic/notes [id number]  . Chronic anemia    [name] 10/09/2014  . ESRD (end stage renal disease) on dialysis [facility])    "[name]; [facility]" (10/09/2014)  . History of blood transfusion    "this is probably my 3rd" (10/09/2014)  . Hypertension   . Low back pain   . Lupus ([place])    lupus w nephritis  . 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 Non-compliant patient   . Positive ANA (antinuclear antibody) 08/16/2012  . 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 Pregnancy   . Psychosis   . Schizoaffective disorder, bipolar type ([place]) 11/20/2014  . Schizophrenia ([place])    [name] 10/09/2014  . Tobacco abuse 02/20/2014        Family History  Problem Relation Age of Onset  . Drug abuse Father   . Kidney disease Father     Social History:  reports that she has been smoking Cigarettes.  She has a 2.00 pack-year smoking history. She has never used smokeless tobacco. She reports that she does not drink alcohol or use drugs.   Allergies:  Allergies  Allergen Reactions  . Ativan [Lorazepam] Swelling and Other (See Comments)    Dysarthria(patient has difficulty speaking and  slurred speech); denies swelling, itching, pain, or numbness.  [name] [Ziprasidone Hcl] Itching and Swelling    Tongue swelling  . Keflex [Cephalexin] Swelling and Other (See Comments)    Tongue swelling. Can't talk   . Haldol [Haloperidol Lactate] Swelling    Tongue swelling. 05/31/15 - MD ok with giving as pt has tolerated in the past Pt can take benadryl.  . Other Itching    wool    Medications Prior to Admission  Medication Sig Dispense Refill  . furosemide (LASIX) 40 MG tablet Take 3 tablets (120 mg total) by mouth daily. 90 tablet 0  . metoprolol (LOPRESSOR) 50 MG tablet Take 1 tablet (50 mg total) by mouth 2 (two) times daily. 60 tablet 0  . predniSONE (DELTASONE) 20 MG tablet Take 1 tablet (20 mg total) by mouth daily with breakfast. 30 tablet 6  . risperiDONE microspheres (RISPERDAL CONSTA) 25 MG injection Inject 25 mg into the muscle every 14 (fourteen) days.    . sevelamer carbonate (RENVELA) 800 MG tablet Take 4 tablets (3,200 mg total) by mouth 3 (three) times daily with meals. 360 tablet 6  . vancomycin (VANCOCIN) 125 MG capsule TAKE ONE CAPSULE BY MOUTH 4 TIMES A DAY FOR 14 DAYS  0  . acetaminophen (TYLENOL) 500 MG tablet Take 1,000 mg by mouth every 6 (six) hours  as needed for moderate pain. Reported on 06/12/2015    . traMADol (ULTRAM) 50 MG tablet Take 1 tablet (50 mg total) by mouth every 6 (six) hours as needed for moderate pain. 30 tablet 0       [id number] from the symptoms mentioned above,there are no other symptoms referable to all systems reviewed.      Physical Exam: Vital signs in last 24 hours: Temp:  [97.9 F (36.6 C)] 97.9 F (36.6 C) (08/16 0744) Pulse Rate:  [107] 107 (08/16 0744) Resp:  [24] 24 (08/16 0744) BP: (149)/(112) 149/112 (08/16 0744) SpO2:  [99 %] 99 % (08/16 0744) Weight:  [176 lb 14.4 oz (80.2 kg)] 176 lb 14.4 oz (80.2 kg) (08/16 0744) Weight change:     Intake/Output from previous day: No intake/output data recorded. No  intake/output data recorded.   Physical Exam: General- pt is awake,alert, follows coomands Resp- No acute REsp distress,  decreased bs at bases. CVS- S1S2 regular in rate and rhythm, NO rubs  GIT- BS+, soft, NT, ND EXT- 2+ LE Edema, NO Cyanosis CNS- CN 2-12 grossly intact. Moving all 4 extremities Access- AVF+, aneurysm present    Lab Results:  CBC    Component Value Date/Time   WBC 8.7 11/19/2015 1214   RBC 3.02 (L) 11/19/2015 1214   HGB 8.3 (L) 11/19/2015 1214   HCT 26.1 (L) 11/19/2015 1214   PLT 280 11/19/2015 1214   MCV 86.4 11/19/2015 1214   MCH 27.5 11/19/2015 1214   MCHC 31.8 11/19/2015 1214   RDW 17.1 (H) 11/19/2015 1214   LYMPHSABS 1.0 09/21/2015 0841   MONOABS 0.7 09/21/2015 0841   EOSABS 0.1 09/21/2015 0841   BASOSABS 0.0 09/21/2015 0841      BMET  Recent Labs  11/19/15 0853 11/19/15 1214  NA 136 135  K 4.9 5.2*  CL 98* 98*  CO2  --  27  GLUCOSE 109* 90  BUN 57* 67*  CREATININE 9.50* 10.25*  CALCIUM  --  9.0  9.2    MICRO Recent Results (from the past 240 hour(s))  C difficile quick scan w PCR reflex     Status: Abnormal   Collection Time: 11/12/15  3:50 PM  Result Value Ref Range Status   C Diff antigen POSITIVE (A) NEGATIVE Final   C Diff toxin NEGATIVE NEGATIVE Final   C Diff interpretation Results are indeterminate. See PCR results.  Final  Clostridium Difficile by PCR     Status: Abnormal   Collection Time: 11/12/15  3:50 PM  Result Value Ref Range Status   Toxigenic C Difficile by pcr POSITIVE (A) NEGATIVE Final    Comment: Performed at [facility]      Lab Results  Component Value Date   PTH 1,100 (H) 11/19/2015   PTH Comment 11/19/2015   CALCIUM 9.2 11/19/2015   CALCIUM 9.0 11/19/2015   CAION 1.15 11/19/2015   PHOS 6.8 (H) 11/19/2015   PHOS 6.7 (H) 11/19/2015      Impression: 1)Renal  ESRD on HD                Pt is not on regular  Schedule sec to her complaince/adherence issues                Will dialyze  pt today  2)HTN Bp is not at goal  3)Anemia In ESRD the goal for HGb is 9--11. 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  Not at goal sec to non adherence to hd tx Phosphorus not at goal.  on binders  5)Psych .  Hx of   psycosis Schizophrenia Primary MD following  6)Electrolytes  Hyperkalemic  Normonatremic   7)Acid base Co2  at goal    Plan:  Will dialyze today Will use 2 k bath Will keep on epo Will try to take 2.5-3 liters off if possible  Addendum Pt seen on  HD Pt tolerating tx well.    Arlee Bossard S 11/21/2015, 9:21 AM

## 2015-11-21 NOTE — Discharge Summary (Signed)
Physician Discharge Summary  [name] [name] DOB: 1980-08-16 DOA: 11/21/2015  PCP: Pcp Not In System  Admit date: 11/21/2015 Discharge date: 11/21/2015  Patient Left Against Medical Advise  Admitted From: Home    Discharge Condition:Improved CODE STATUS:Full Diet recommendation: Renal   Brief/Interim Summary: Sara [name] is a 35 y.o. female with medical history significant of gross medical noncompliance, end-stage renal disease, hypertension, lupus nephritis, schizo hernia who presents emergency department seeking routine dialysis. Patient has a long history of multiple visits for the same issue as well as long-standing history of gross medical noncompliance and leaving hospital [place] on numerous occasions.    1. End-stage renal disease 1. Remains stable 2. Nephrology consulted through the emergency department 3. Patient underwent dialysis this admission. 4. See minutes after arriving to back to floor from dialysis, patient elected to sign out [name] 2. Hyperkalemia 1. Presenting potassium 5.2 2. Patient underwent dialysis as per above 3. Hypertension 1. Blood pressure currently stable 2. Would continue home regimen 4. Schizoaffective disorder, bipolar type 1. Seems stable at present 2. Continue to monitor 5. Systemic lupus 1. Appear stable 6. Anemia of renal disease 1. Hemoglobin stable 2. Patient hemodynamically stable, no indication for urgent blood transfusion at this time 7. Gross medical noncompliance 1. Per above, patient with multiple documented episodes of gross medical noncompliance 8. Recent Cdiff colitis 1. Patient continued on total 2 week course of po vanc per home regimen (diagnosed 8/7)  Discharge Diagnoses:  Principal Problem:   ESRD (end stage renal disease) on dialysis [facility]) Active Problems:   Essential hypertension   Schizoaffective disorder, bipolar type (Sara [name])   Hypervolemia   Systemic lupus  (Sara [name])   Anemia in ESRD (end-stage renal disease) (Sara [name])   ESRD on hemodialysis ([name])   Lupus (systemic lupus erythematosus) (Sara [name])   Noncompliance    Discharge Instructions     Allergies  Allergen Reactions  . Ativan [Lorazepam] Swelling and Other (See Comments)    Dysarthria(patient has difficulty speaking and slurred speech); denies swelling, itching, pain, or numbness.  [name] [Ziprasidone Hcl] Itching and Swelling    Tongue swelling  . Keflex [Cephalexin] Swelling and Other (See Comments)    Tongue swelling. Can't talk   . Haldol [Haloperidol Lactate] Swelling    Tongue swelling. 05/31/15 - MD ok with giving as pt has tolerated in the past Pt can take benadryl.  . Other Itching    wool    Consultations:  Nephrology  Procedures/Studies: Dg Chest 2 View  Result Date: 10/29/2015 CLINICAL DATA:  Shortness of breath.  Chronic renal failure EXAM: CHEST  2 VIEW COMPARISON:  September 28, 2015 FINDINGS: There is no edema or consolidation. There is cardiomegaly with pulmonary vascularity within normal limits. No adenopathy. There is erosive change in the distal clavicles with extensive bony hypertrophy in these areas, stable. IMPRESSION: Persistent cardiomegaly. No edema or consolidation. Changes in the distal clavicles most likely are due to secondary hyperparathyroidism associated with chronic renal failure. Electronically Signed   By: Lowella Grip III M.D.   On: 10/29/2015 09:56  Dg Finger Thumb Left  Result Date: 11/02/2015 CLINICAL DATA:  Left thumb pain. EXAM: LEFT THUMB 2+V COMPARISON:  None. FINDINGS: There is no evidence of fracture or dislocation. There is no evidence of arthropathy or other focal bone abnormality. Soft tissues demonstrate diffuse edema. IMPRESSION: No acute fracture or dislocation identified about the left thumb. Diffuse soft tissue edema. Electronically Signed   By: Fidela Salisbury  M.D.   On: 11/02/2015 08:54     Discharge Exam: Vitals:    11/21/15 1600 11/21/15 1630  BP: (!) 159/92 (!) 161/93  Pulse: 90 94  Resp:    Temp:     Vitals:   11/21/15 1500 11/21/15 1530 11/21/15 1600 11/21/15 1630  BP: (!) 155/98 (!) 161/90 (!) 159/92 (!) 161/93  Pulse: 99 94 90 94  Resp:      Temp:      TempSrc:      SpO2:      Weight:      Height:          The results of significant diagnostics from this hospitalization (including imaging, microbiology, ancillary and laboratory) are listed below for reference.     Microbiology: Recent Results (from the past 240 hour(s))  C difficile quick scan w PCR reflex     Status: Abnormal   Collection Time: 11/12/15  3:50 PM  Result Value Ref Range Status   C Diff antigen POSITIVE (A) NEGATIVE Final   C Diff toxin NEGATIVE NEGATIVE Final   C Diff interpretation Results are indeterminate. See PCR results.  Final  Clostridium Difficile by PCR     Status: Abnormal   Collection Time: 11/12/15  3:50 PM  Result Value Ref Range Status   Toxigenic C Difficile by pcr POSITIVE (A) NEGATIVE Final    Comment: Performed at [facility]: BNP (last 3 results) No results for input(s): BNP in the last 8760 hours. Basic Metabolic Panel:  Recent Labs Lab 11/16/15 1353 11/19/15 0853 11/19/15 1214 11/21/15 1245  NA 134* 136 135  --   K 5.4* 4.9 5.2*  --   CL 100* 98* 98*  --   CO2 23  --  27  --   GLUCOSE 84 109* 90  --   BUN 63* 57* 67*  --   CREATININE 10.70* 9.50* 10.25* 9.03*  CALCIUM 8.6*  --  9.0  9.2  --   PHOS 7.2*  --  6.8*  6.7*  --    Liver Function Tests:  Recent Labs Lab 11/16/15 1353 11/19/15 1214  AST 30  --   ALT 17  --   ALKPHOS 94  --   BILITOT 0.7  --   PROT 6.4*  --   ALBUMIN 3.0*  3.0* 3.1*   No results for input(s): LIPASE, AMYLASE in the last 168 hours. No results for input(s): AMMONIA in the last 168 hours. CBC:  Recent Labs Lab 11/16/15 1353 11/19/15 0853 11/19/15 1214 11/21/15 1245  WBC 7.6  --  8.7 6.9  HGB 8.3* 10.2* 8.3* 8.5*   HCT 25.2* 30.0* 26.1* 26.8*  MCV 86.3  --  86.4 87.3  PLT 210  --  280 289   Cardiac Enzymes: No results for input(s): CKTOTAL, CKMB, CKMBINDEX, TROPONINI in the last 168 hours. BNP: Invalid input(s): POCBNP CBG: No results for input(s): GLUCAP in the last 168 hours. D-Dimer No results for input(s): DDIMER in the last 72 hours. Hgb A1c No results for input(s): HGBA1C in the last 72 hours. Lipid Profile No results for input(s): CHOL, HDL, LDLCALC, TRIG, CHOLHDL, LDLDIRECT in the last 72 hours. Thyroid function studies No results for input(s): TSH, T4TOTAL, T3FREE, THYROIDAB in the last 72 hours.  Invalid input(s): FREET3 Anemia work up No results for input(s): VITAMINB12, FOLATE, FERRITIN, TIBC, IRON, RETICCTPCT in the last 72 hours. Urinalysis    Component Value Date/Time   COLORURINE YELLOW 08/11/2015  [place] (A) 08/11/2015 1235   LABSPEC 1.015 08/11/2015 1235   PHURINE 7.5 08/11/2015 1235   GLUCOSEU 100 (A) 08/11/2015 1235   HGBUR SMALL (A) 08/11/2015 1235   BILIRUBINUR NEGATIVE 08/11/2015 1235   KETONESUR NEGATIVE 08/11/2015 1235   PROTEINUR >300 (A) 08/11/2015 1235   UROBILINOGEN 0.2 01/09/2015 1645   NITRITE NEGATIVE 08/11/2015 1235   LEUKOCYTESUR SMALL (A) 08/11/2015 1235   Sepsis Labs Invalid input(s): PROCALCITONIN,  WBC,  LACTICIDVEN Microbiology Recent Results (from the past 240 hour(s))  C difficile quick scan w PCR reflex     Status: Abnormal   Collection Time: 11/12/15  3:50 PM  Result Value Ref Range Status   C Diff antigen POSITIVE (A) NEGATIVE Final   C Diff toxin NEGATIVE NEGATIVE Final   C Diff interpretation Results are indeterminate. See PCR results.  Final  Clostridium Difficile by PCR     Status: Abnormal   Collection Time: 11/12/15  3:50 PM  Result Value Ref Range Status   Toxigenic C Difficile by pcr POSITIVE (A) NEGATIVE Final    Comment: Performed at [facility]     SIGNED:   Donne Hazel, MD  [facility] 11/21/2015, 4:54 PM  If 7PM-7AM, please contact night-coverage [email or web address] Password [name]
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
## 2015-11-23 ENCOUNTER — Encounter (HOSPITAL_COMMUNITY): Payer: Self-pay | Admitting: Emergency Medicine

## 2015-11-23 ENCOUNTER — Observation Stay (HOSPITAL_COMMUNITY)
Admission: EM | Admit: 2015-11-23 | Discharge: 2015-11-23 | Disposition: A | Discharge status: Home / Self Care | Payer: Medicare Other | Attending: Internal Medicine | Admitting: Internal Medicine

## 2015-11-23 DIAGNOSIS — Z992 Dependence on renal dialysis: Secondary | ICD-10-CM

## 2015-11-23 DIAGNOSIS — F250 Schizoaffective disorder, bipolar type: Secondary | ICD-10-CM | POA: Diagnosis present

## 2015-11-23 DIAGNOSIS — Z9119 Patient's noncompliance with other medical treatment and regimen: Secondary | ICD-10-CM

## 2015-11-23 DIAGNOSIS — N185 Chronic kidney disease, stage 5: Secondary | ICD-10-CM

## 2015-11-23 DIAGNOSIS — I509 Heart failure, unspecified: Secondary | ICD-10-CM | POA: Diagnosis not present

## 2015-11-23 DIAGNOSIS — I132 Hypertensive heart and chronic kidney disease with heart failure and with stage 5 chronic kidney disease, or end stage renal disease: Principal | ICD-10-CM | POA: Insufficient documentation

## 2015-11-23 DIAGNOSIS — E8770 Fluid overload, unspecified: Secondary | ICD-10-CM | POA: Diagnosis present

## 2015-11-23 DIAGNOSIS — N186 End stage renal disease: Secondary | ICD-10-CM | POA: Insufficient documentation

## 2015-11-23 DIAGNOSIS — F17210 Nicotine dependence, cigarettes, uncomplicated: Secondary | ICD-10-CM | POA: Insufficient documentation

## 2015-11-23 DIAGNOSIS — Z91199 Patient's noncompliance with other medical treatment and regimen due to unspecified reason: Secondary | ICD-10-CM

## 2015-11-23 DIAGNOSIS — I10 Essential (primary) hypertension: Secondary | ICD-10-CM

## 2015-11-23 DIAGNOSIS — Z79899 Other long term (current) drug therapy: Secondary | ICD-10-CM | POA: Diagnosis not present

## 2015-11-23 DIAGNOSIS — N19 Unspecified kidney failure: Secondary | ICD-10-CM | POA: Diagnosis present

## 2015-11-23 DIAGNOSIS — M329 Systemic lupus erythematosus, unspecified: Secondary | ICD-10-CM | POA: Diagnosis not present

## 2015-11-23 DIAGNOSIS — I120 Hypertensive chronic kidney disease with stage 5 chronic kidney disease or end stage renal disease: Secondary | ICD-10-CM | POA: Diagnosis not present

## 2015-11-23 LAB — CBC
HEMATOCRIT: 28.4 % — AB (ref 36.0–46.0)
HEMOGLOBIN: 9 g/dL — AB (ref 12.0–15.0)
MCH: 28 pg (ref 26.0–34.0)
MCHC: 31.7 g/dL (ref 30.0–36.0)
MCV: 88.2 fL (ref 78.0–100.0)
Platelets: 237 10*3/uL (ref 150–400)
RBC: 3.22 MIL/uL — AB (ref 3.87–5.11)
RDW: 17 % — ABNORMAL HIGH (ref 11.5–15.5)
WBC: 6.9 10*3/uL (ref 4.0–10.5)

## 2015-11-23 LAB — RENAL FUNCTION PANEL
Albumin: 3 g/dL — ABNORMAL LOW (ref 3.5–5.0)
Anion gap: 8 (ref 5–15)
BUN: 38 mg/dL — AB (ref 6–20)
CALCIUM: 8.6 mg/dL — AB (ref 8.9–10.3)
CHLORIDE: 100 mmol/L — AB (ref 101–111)
CO2: 26 mmol/L (ref 22–32)
CREATININE: 8.47 mg/dL — AB (ref 0.44–1.00)
GFR calc non Af Amer: 5 mL/min — ABNORMAL LOW (ref >60)
GFR, EST AFRICAN AMERICAN: 6 mL/min — AB (ref >60)
GLUCOSE: 88 mg/dL (ref 65–99)
Phosphorus: 6.1 mg/dL — ABNORMAL HIGH (ref 2.5–4.6)
Potassium: 4.3 mmol/L (ref 3.5–5.1)
SODIUM: 134 mmol/L — AB (ref 135–145)

## 2015-11-23 LAB — CREATININE, SERUM
CREATININE: 8.56 mg/dL — AB (ref 0.44–1.00)
GFR, EST AFRICAN AMERICAN: 6 mL/min — AB (ref >60)
GFR, EST NON AFRICAN AMERICAN: 5 mL/min — AB (ref >60)

## 2015-11-23 LAB — I-STAT CHEM 8, ED
BUN: 34 mg/dL — ABNORMAL HIGH (ref 6–20)
Calcium, Ion: 1.17 mmol/L (ref 1.13–1.30)
Chloride: 97 mmol/L — ABNORMAL LOW (ref 101–111)
Creatinine, Ser: 8.1 mg/dL — ABNORMAL HIGH (ref 0.44–1.00)
Glucose, Bld: 88 mg/dL (ref 65–99)
HEMATOCRIT: 29 % — AB (ref 36.0–46.0)
HEMOGLOBIN: 9.9 g/dL — AB (ref 12.0–15.0)
POTASSIUM: 4.3 mmol/L (ref 3.5–5.1)
SODIUM: 137 mmol/L (ref 135–145)
TCO2: 28 mmol/L (ref 0–100)

## 2015-11-23 MED ORDER — ACETAMINOPHEN 650 MG RE SUPP: 650.0000 mg | Freq: Four times a day (QID) | RECTAL | Status: DC | PRN

## 2015-11-23 MED ORDER — HEPARIN SODIUM (PORCINE) 1000 UNIT/ML DIALYSIS
1000.0000 [IU] | INTRAMUSCULAR | Status: DC | PRN
  Filled 2015-11-23: qty 1

## 2015-11-23 MED ORDER — VANCOMYCIN 50 MG/ML ORAL SOLUTION
125.0000 mg | Freq: Four times a day (QID) | ORAL | Status: DC
  Filled 2015-11-23 (×12): qty 2.5

## 2015-11-23 MED ORDER — LIDOCAINE HCL (PF) 1 % IJ SOLN
INTRAMUSCULAR | Status: AC
  Administered 2015-11-23: 0.5 mL via INTRADERMAL
  Filled 2015-11-23: qty 5

## 2015-11-23 MED ORDER — SODIUM CHLORIDE 0.9 % IV SOLN: 100.0000 mL | INTRAVENOUS | Status: DC | PRN

## 2015-11-23 MED ORDER — EPOETIN ALFA 10000 UNIT/ML IJ SOLN
10000.0000 [IU] | INTRAMUSCULAR | Status: DC
  Filled 2015-11-23: qty 1

## 2015-11-23 MED ORDER — ALTEPLASE 2 MG IJ SOLR
2.0000 mg | Freq: Once | INTRAMUSCULAR | Status: DC | PRN
  Filled 2015-11-23: qty 2

## 2015-11-23 MED ORDER — HEPARIN SODIUM (PORCINE) 1000 UNIT/ML IJ SOLN
INTRAMUSCULAR | Status: AC
  Administered 2015-11-23: 1500 [IU] via INTRAVENOUS_CENTRAL
  Filled 2015-11-23: qty 4

## 2015-11-23 MED ORDER — EPOETIN ALFA 10000 UNIT/ML IJ SOLN
INTRAMUSCULAR | Status: AC
  Administered 2015-11-23: 10000 [IU] via INTRAVENOUS
  Filled 2015-11-23: qty 1

## 2015-11-23 MED ORDER — SEVELAMER CARBONATE 800 MG PO TABS
3200.0000 mg | ORAL_TABLET | Freq: Three times a day (TID) | ORAL | Status: DC
  Administered 2015-11-23: 3200 mg via ORAL
  Filled 2015-11-23 (×11): qty 4

## 2015-11-23 MED ORDER — METOPROLOL TARTRATE 50 MG PO TABS
50.0000 mg | ORAL_TABLET | Freq: Two times a day (BID) | ORAL | Status: DC
  Administered 2015-11-23: 50 mg via ORAL
  Filled 2015-11-23 (×2): qty 1

## 2015-11-23 MED ORDER — HEPARIN SODIUM (PORCINE) 1000 UNIT/ML DIALYSIS
20.0000 [IU]/kg | INTRAMUSCULAR | Status: DC | PRN
  Administered 2015-11-23: 1500 [IU] via INTRAVENOUS_CENTRAL
  Filled 2015-11-23 (×2): qty 2

## 2015-11-23 MED ORDER — LIDOCAINE HCL (PF) 1 % IJ SOLN
5.0000 mL | INTRAMUSCULAR | Status: DC | PRN
  Administered 2015-11-23: 0.5 mL via INTRADERMAL

## 2015-11-23 MED ORDER — HEPARIN SODIUM (PORCINE) 5000 UNIT/ML IJ SOLN
5000.0000 [IU] | Freq: Three times a day (TID) | INTRAMUSCULAR | Status: DC
  Filled 2015-11-23: qty 1

## 2015-11-23 MED ORDER — FUROSEMIDE 80 MG PO TABS
120.0000 mg | ORAL_TABLET | Freq: Every day | ORAL | Status: DC
  Administered 2015-11-23: 120 mg via ORAL
  Filled 2015-11-23 (×2): qty 1

## 2015-11-23 MED ORDER — DIPHENHYDRAMINE HCL 25 MG PO CAPS
25.0000 mg | ORAL_CAPSULE | Freq: Four times a day (QID) | ORAL | Status: DC | PRN
  Administered 2015-11-23: 25 mg via ORAL

## 2015-11-23 MED ORDER — TRAMADOL HCL 50 MG PO TABS
ORAL_TABLET | ORAL | Status: AC
  Administered 2015-11-23: 50 mg via ORAL
  Filled 2015-11-23: qty 1

## 2015-11-23 MED ORDER — PREDNISONE 20 MG PO TABS: 20.0000 mg | ORAL_TABLET | Freq: Every day | ORAL | Status: DC

## 2015-11-23 MED ORDER — SEVELAMER CARBONATE 800 MG PO TABS
1600.0000 mg | ORAL_TABLET | Freq: Three times a day (TID) | ORAL | Status: DC
  Filled 2015-11-23: qty 2

## 2015-11-23 MED ORDER — ACETAMINOPHEN 325 MG PO TABS: 650.0000 mg | ORAL_TABLET | Freq: Four times a day (QID) | ORAL | Status: DC | PRN

## 2015-11-23 MED ORDER — DIPHENHYDRAMINE HCL 25 MG PO CAPS
ORAL_CAPSULE | ORAL | Status: AC
  Administered 2015-11-23: 25 mg via ORAL
  Filled 2015-11-23: qty 1

## 2015-11-23 MED ORDER — TRAMADOL HCL 50 MG PO TABS
50.0000 mg | ORAL_TABLET | Freq: Four times a day (QID) | ORAL | Status: DC | PRN
  Administered 2015-11-23: 50 mg via ORAL

## 2015-11-23 MED ORDER — PENTAFLUOROPROP-TETRAFLUOROETH EX AERO: 1.0000 "application " | INHALATION_SPRAY | CUTANEOUS | Status: DC | PRN

## 2015-11-23 MED ORDER — EPOETIN ALFA 10000 UNIT/ML IJ SOLN
10000.0000 [IU] | INTRAMUSCULAR | Status: DC
  Administered 2015-11-23: 10000 [IU] via INTRAVENOUS
  Filled 2015-11-23: qty 1

## 2015-11-23 MED ORDER — RISPERIDONE MICROSPHERES 25 MG IM SUSR: 25.0000 mg | INTRAMUSCULAR | Status: DC

## 2015-11-23 MED ORDER — LIDOCAINE-PRILOCAINE 2.5-2.5 % EX CREA: 1.0000 "application " | TOPICAL_CREAM | CUTANEOUS | Status: DC | PRN

## 2015-11-23 NOTE — Discharge Summary (Signed)
Physician Discharge Summary  Sara [name] [id number] DOB: 1980/05/16 DOA: 11/23/2015  PCP: Pcp Not In System  Admit date: 11/23/2015 Discharge date: 11/23/2015  Patient left against medical advise  Brief/Interim Summary: 35 y.o. female with medical history significant of stage renal disease on Monday Wednesday Friday dialysis, lupus, schizoaffective disorder who presents to the emergency department seeking dialysis. Patient is continued on by mouth vancomycin for treatment of C. difficile. Patient denies diarrhea or abdominal pains.  1. End-stage renal disease on dialysis 1. Patient on Monday Wednesday Friday hemodialysis 2. Nephrology was consulted 3. Patient underwent HD  4. Electrolytes were stable 5. Patient to continue renal diet 6. Following HD, patient elected to leave against medical advise 2. Hypertension 1. Blood pressure on presentation was elevated with diastolic greater than [id number] 3. Schizoaffective disorder 1. Appear stable at present 4. Lupus 1. Stable currently 5. History of gross medical noncompliance 1. See prior notes. Patient has a history of leaving [place] on multiple occasions. 6. Hx possible Cdiff colitis 1. Currently being treated with PO vanc  Discharge Diagnoses:  Principal Problem:   ESRD on hemodialysis [facility]) Active Problems:   Essential hypertension   Schizoaffective disorder, bipolar type ([place])   Hypervolemia   Systemic lupus (HCC)   Volume overload   Noncompliance   Renal failure  Discharge Instructions     Allergies  Allergen Reactions  . Ativan [Lorazepam] Swelling and Other (See Comments)    Dysarthria(patient has difficulty speaking and slurred speech); denies swelling, itching, pain, or numbness.  [name] [Ziprasidone Hcl] Itching and Swelling    Tongue swelling  . Keflex [Cephalexin] Swelling and Other (See Comments)    Tongue swelling. Can't talk   . Haldol [Haloperidol Lactate] Swelling    Tongue  swelling. 05/31/15 - MD ok with giving as pt has tolerated in the past Pt can take benadryl.  . Other Itching    wool    Consultations:  Nephrology  Procedures/Studies: Dg Chest 2 View  Result Date: 10/29/2015 CLINICAL DATA:  Shortness of breath.  Chronic renal failure EXAM: CHEST  2 VIEW COMPARISON:  September 28, 2015 FINDINGS: There is no edema or consolidation. There is cardiomegaly with pulmonary vascularity within normal limits. No adenopathy. There is erosive change in the distal clavicles with extensive bony hypertrophy in these areas, stable. IMPRESSION: Persistent cardiomegaly. No edema or consolidation. Changes in the distal clavicles most likely are due to secondary hyperparathyroidism associated with chronic renal failure. Electronically Signed   By: Lowella Grip III M.D.   On: 10/29/2015 09:56  Dg Finger Thumb Left  Result Date: 11/02/2015 CLINICAL DATA:  Left thumb pain. EXAM: LEFT THUMB 2+V COMPARISON:  None. FINDINGS: There is no evidence of fracture or dislocation. There is no evidence of arthropathy or other focal bone abnormality. Soft tissues demonstrate diffuse edema. IMPRESSION: No acute fracture or dislocation identified about the left thumb. Diffuse soft tissue edema. Electronically Signed   By: Fidela Salisbury M.D.   On: 11/02/2015 08:54    Subjective: Eager to go home  Discharge Exam: Vitals:   11/23/15 1545 11/23/15 1615  BP: (!) 155/93 (!) (P) 173/103  Pulse: (!) 106 (!) (P) 106  Resp:    Temp:     Vitals:   11/23/15 1445 11/23/15 1515 11/23/15 1545 11/23/15 1615  BP: (!) 159/109 (!) 160/113 (!) 155/93 (!) (P) 173/103  Pulse: 100 (!) 103 (!) 106 (!) (P) 106  Resp:      Temp:  TempSrc:      SpO2:      Weight:      Height:        The results of significant diagnostics from this hospitalization (including imaging, microbiology, ancillary and laboratory) are listed below for reference.     Microbiology: No results found for this or any  previous visit (from the past 240 hour(s)).   Labs: BNP (last 3 results) No results for input(s): BNP in the last 8760 hours. Basic Metabolic Panel:  Recent Labs Lab 11/19/15 0853 11/19/15 1214 11/21/15 1245 11/23/15 1027  NA 136 135  --  134*  137  K 4.9 5.2*  --  4.3  4.3  CL 98* 98*  --  100*  97*  CO2  --  27  --  26  GLUCOSE 109* 90  --  88  88  BUN 57* 67*  --  38*  34*  CREATININE 9.50* 10.25* 9.03* 8.47*  8.56*  8.10*  CALCIUM  --  9.0  9.2  --  8.6*  PHOS  --  6.8*  6.7*  --  6.1*   Liver Function Tests:  Recent Labs Lab 11/19/15 1214 11/23/15 1027  ALBUMIN 3.1* 3.0*   No results for input(s): LIPASE, AMYLASE in the last 168 hours. No results for input(s): AMMONIA in the last 168 hours. CBC:  Recent Labs Lab 11/19/15 0853 11/19/15 1214 11/21/15 1245 11/23/15 1027  WBC  --  8.7 6.9 6.9  HGB 10.2* 8.3* 8.5* 9.0*  9.9*  HCT 30.0* 26.1* 26.8* 28.4*  29.0*  MCV  --  86.4 87.3 88.2  PLT  --  280 289 237   Cardiac Enzymes: No results for input(s): CKTOTAL, CKMB, CKMBINDEX, TROPONINI in the last 168 hours. BNP: Invalid input(s): POCBNP CBG: No results for input(s): GLUCAP in the last 168 hours. D-Dimer No results for input(s): DDIMER in the last 72 hours. Hgb A1c No results for input(s): HGBA1C in the last 72 hours. Lipid Profile No results for input(s): CHOL, HDL, LDLCALC, TRIG, CHOLHDL, LDLDIRECT in the last 72 hours. Thyroid function studies No results for input(s): TSH, T4TOTAL, T3FREE, THYROIDAB in the last 72 hours.  Invalid input(s): FREET3 Anemia work up No results for input(s): VITAMINB12, FOLATE, FERRITIN, TIBC, IRON, RETICCTPCT in the last 72 hours. Urinalysis    Component Value Date/Time   COLORURINE YELLOW 08/11/2015 1235   APPEARANCEUR HAZY (A) 08/11/2015 1235   LABSPEC 1.015 08/11/2015 1235   PHURINE 7.5 08/11/2015 1235   GLUCOSEU 100 (A) 08/11/2015 1235   HGBUR SMALL (A) 08/11/2015 1235   BILIRUBINUR NEGATIVE  08/11/2015 1235   KETONESUR NEGATIVE 08/11/2015 1235   PROTEINUR >300 (A) 08/11/2015 1235   UROBILINOGEN 0.2 01/09/2015 1645   NITRITE NEGATIVE 08/11/2015 1235   LEUKOCYTESUR SMALL (A) 08/11/2015 1235   Sepsis Labs Invalid input(s): PROCALCITONIN,  WBC,  LACTICIDVEN Microbiology No results found for this or any previous visit (from the past 240 hour(s)).   SIGNED:   Donne Hazel, MD  [facility] 11/23/2015, 4:59 PM  If 7PM-7AM, please contact night-coverage [email or web address] Password [name]
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
## 2015-11-23 NOTE — Procedures (Signed)
    HEMODIALYSIS TREATMENT NOTE:  2 hours of 5 minutes of HD completed via right upper arm AVF (15g ante/retrograde). Goal NOT met:  Pt requested to end her treatment early/AMA almost halfway through HD session.  She simply announced, "I'm ready to come off."  She was informed that we had not yet reached the halfway point, that she remained hypertensive, and that only 2 liters had been ultrafiltrated."  She responded, "Yeah, I'm ready to come off."  All blood was returned and hemostasis was achieved within 10 minutes.  Pt signed [place] waiver and was given Metoprolol and Furosemide (previously held).  Fluid and diet restrictions were encouraged.  Dr. Lowanda Foster was notified.  Rockwell Alexandria, RN, CDN

## 2015-11-23 NOTE — Progress Notes (Signed)
Pt returned to room after dialysis. Pt stated that she wanted to leave AMA. Pt in stable condition and in no acute distress at time of discharge. MD notified and made aware.

## 2015-11-23 NOTE — ED Triage Notes (Signed)
Pt here for routine dialysis.  Denies any complaints.

## 2015-11-26 ENCOUNTER — Observation Stay (HOSPITAL_COMMUNITY)
Admission: EM | Admit: 2015-11-26 | Discharge: 2015-11-26 | Disposition: A | Discharge status: Home / Self Care | Payer: Medicare Other | Attending: Internal Medicine | Admitting: Internal Medicine

## 2015-11-26 ENCOUNTER — Encounter (HOSPITAL_COMMUNITY): Payer: Self-pay | Admitting: Emergency Medicine

## 2015-11-26 DIAGNOSIS — N186 End stage renal disease: Secondary | ICD-10-CM | POA: Insufficient documentation

## 2015-11-26 DIAGNOSIS — E8770 Fluid overload, unspecified: Secondary | ICD-10-CM | POA: Diagnosis not present

## 2015-11-26 DIAGNOSIS — D631 Anemia in chronic kidney disease: Secondary | ICD-10-CM | POA: Diagnosis not present

## 2015-11-26 DIAGNOSIS — I120 Hypertensive chronic kidney disease with stage 5 chronic kidney disease or end stage renal disease: Secondary | ICD-10-CM | POA: Diagnosis not present

## 2015-11-26 DIAGNOSIS — Z992 Dependence on renal dialysis: Secondary | ICD-10-CM | POA: Insufficient documentation

## 2015-11-26 DIAGNOSIS — I132 Hypertensive heart and chronic kidney disease with heart failure and with stage 5 chronic kidney disease, or end stage renal disease: Principal | ICD-10-CM | POA: Insufficient documentation

## 2015-11-26 DIAGNOSIS — Z7952 Long term (current) use of systemic steroids: Secondary | ICD-10-CM | POA: Diagnosis not present

## 2015-11-26 DIAGNOSIS — Z8719 Personal history of other diseases of the digestive system: Secondary | ICD-10-CM | POA: Insufficient documentation

## 2015-11-26 DIAGNOSIS — Z91199 Patient's noncompliance with other medical treatment and regimen due to unspecified reason: Secondary | ICD-10-CM

## 2015-11-26 DIAGNOSIS — I5022 Chronic systolic (congestive) heart failure: Secondary | ICD-10-CM | POA: Insufficient documentation

## 2015-11-26 DIAGNOSIS — F17210 Nicotine dependence, cigarettes, uncomplicated: Secondary | ICD-10-CM | POA: Diagnosis not present

## 2015-11-26 DIAGNOSIS — M329 Systemic lupus erythematosus, unspecified: Secondary | ICD-10-CM | POA: Diagnosis not present

## 2015-11-26 DIAGNOSIS — Z9119 Patient's noncompliance with other medical treatment and regimen: Secondary | ICD-10-CM | POA: Insufficient documentation

## 2015-11-26 DIAGNOSIS — F250 Schizoaffective disorder, bipolar type: Secondary | ICD-10-CM | POA: Diagnosis present

## 2015-11-26 DIAGNOSIS — I10 Essential (primary) hypertension: Secondary | ICD-10-CM | POA: Diagnosis not present

## 2015-11-26 DIAGNOSIS — Z79899 Other long term (current) drug therapy: Secondary | ICD-10-CM | POA: Diagnosis not present

## 2015-11-26 LAB — RENAL FUNCTION PANEL
ALBUMIN: 3 g/dL — AB (ref 3.5–5.0)
Anion gap: 10 (ref 5–15)
BUN: 51 mg/dL — AB (ref 6–20)
CO2: 26 mmol/L (ref 22–32)
CREATININE: 11 mg/dL — AB (ref 0.44–1.00)
Calcium: 8.5 mg/dL — ABNORMAL LOW (ref 8.9–10.3)
Chloride: 99 mmol/L — ABNORMAL LOW (ref 101–111)
GFR calc Af Amer: 5 mL/min — ABNORMAL LOW (ref >60)
GFR, EST NON AFRICAN AMERICAN: 4 mL/min — AB (ref >60)
Glucose, Bld: 76 mg/dL (ref 65–99)
PHOSPHORUS: 6.7 mg/dL — AB (ref 2.5–4.6)
Potassium: 4.5 mmol/L (ref 3.5–5.1)
SODIUM: 135 mmol/L (ref 135–145)

## 2015-11-26 LAB — CBC
HCT: 25.9 % — ABNORMAL LOW (ref 36.0–46.0)
Hemoglobin: 8.3 g/dL — ABNORMAL LOW (ref 12.0–15.0)
MCH: 27.9 pg (ref 26.0–34.0)
MCHC: 32 g/dL (ref 30.0–36.0)
MCV: 86.9 fL (ref 78.0–100.0)
PLATELETS: 200 10*3/uL (ref 150–400)
RBC: 2.98 MIL/uL — ABNORMAL LOW (ref 3.87–5.11)
RDW: 16.7 % — AB (ref 11.5–15.5)
WBC: 8.5 10*3/uL (ref 4.0–10.5)

## 2015-11-26 MED ORDER — EPOETIN ALFA 10000 UNIT/ML IJ SOLN
10000.0000 [IU] | INTRAMUSCULAR | Status: DC
  Administered 2015-11-26: 10000 [IU] via INTRAVENOUS

## 2015-11-26 MED ORDER — DOXERCALCIFEROL 4 MCG/2ML IV SOLN
INTRAVENOUS | Status: AC
  Administered 2015-11-26: 7 ug via INTRAVENOUS
  Filled 2015-11-26: qty 4

## 2015-11-26 MED ORDER — FUROSEMIDE 40 MG PO TABS: 120.0000 mg | ORAL_TABLET | Freq: Every day | ORAL | Status: DC

## 2015-11-26 MED ORDER — LIDOCAINE HCL (PF) 1 % IJ SOLN
5.0000 mL | INTRAMUSCULAR | Status: DC | PRN
  Administered 2015-11-26: 0.5 mL via INTRADERMAL

## 2015-11-26 MED ORDER — DIPHENHYDRAMINE HCL 25 MG PO CAPS
25.0000 mg | ORAL_CAPSULE | Freq: Four times a day (QID) | ORAL | Status: DC | PRN
  Administered 2015-11-26: 25 mg via ORAL

## 2015-11-26 MED ORDER — HEPARIN SODIUM (PORCINE) 5000 UNIT/ML IJ SOLN: 5000.0000 [IU] | Freq: Three times a day (TID) | INTRAMUSCULAR | Status: DC

## 2015-11-26 MED ORDER — TRAMADOL HCL 50 MG PO TABS
50.0000 mg | ORAL_TABLET | Freq: Four times a day (QID) | ORAL | Status: DC | PRN
  Administered 2015-11-26: 50 mg via ORAL

## 2015-11-26 MED ORDER — ACETAMINOPHEN 650 MG RE SUPP: 650.0000 mg | Freq: Four times a day (QID) | RECTAL | Status: DC | PRN

## 2015-11-26 MED ORDER — LIDOCAINE-PRILOCAINE 2.5-2.5 % EX CREA: 1.0000 "application " | TOPICAL_CREAM | CUTANEOUS | Status: DC | PRN

## 2015-11-26 MED ORDER — SODIUM CHLORIDE 0.9 % IV SOLN: 100.0000 mL | INTRAVENOUS | Status: DC | PRN

## 2015-11-26 MED ORDER — DOXERCALCIFEROL 4 MCG/2ML IV SOLN
7.0000 ug | INTRAVENOUS | Status: DC
  Administered 2015-11-26: 7 ug via INTRAVENOUS

## 2015-11-26 MED ORDER — SEVELAMER CARBONATE 800 MG PO TABS
ORAL_TABLET | ORAL | Status: AC
  Administered 2015-11-26: 3200 mg via ORAL
  Filled 2015-11-26: qty 4

## 2015-11-26 MED ORDER — PREDNISONE 20 MG PO TABS: 20.0000 mg | ORAL_TABLET | Freq: Every day | ORAL | Status: DC

## 2015-11-26 MED ORDER — TRAMADOL HCL 50 MG PO TABS
ORAL_TABLET | ORAL | Status: AC
  Administered 2015-11-26: 50 mg via ORAL
  Filled 2015-11-26: qty 1

## 2015-11-26 MED ORDER — HEPARIN SODIUM (PORCINE) 1000 UNIT/ML IJ SOLN
INTRAMUSCULAR | Status: AC
  Filled 2015-11-26: qty 6

## 2015-11-26 MED ORDER — PENTAFLUOROPROP-TETRAFLUOROETH EX AERO: 1.0000 "application " | INHALATION_SPRAY | CUTANEOUS | Status: DC | PRN

## 2015-11-26 MED ORDER — LIDOCAINE HCL (PF) 1 % IJ SOLN
INTRAMUSCULAR | Status: AC
  Administered 2015-11-26: 0.5 mL via INTRADERMAL
  Filled 2015-11-26: qty 5

## 2015-11-26 MED ORDER — VANCOMYCIN HCL 125 MG PO CAPS
125.0000 mg | ORAL_CAPSULE | Freq: Four times a day (QID) | ORAL | Status: DC
  Filled 2015-11-26 (×10): qty 1

## 2015-11-26 MED ORDER — EPOETIN ALFA 10000 UNIT/ML IJ SOLN
INTRAMUSCULAR | Status: AC
  Administered 2015-11-26: 10000 [IU] via INTRAVENOUS
  Filled 2015-11-26: qty 1

## 2015-11-26 MED ORDER — DIPHENHYDRAMINE HCL 25 MG PO CAPS
ORAL_CAPSULE | ORAL | Status: AC
  Administered 2015-11-26: 25 mg via ORAL
  Filled 2015-11-26: qty 1

## 2015-11-26 MED ORDER — METOPROLOL TARTRATE 50 MG PO TABS: 50.0000 mg | ORAL_TABLET | Freq: Two times a day (BID) | ORAL | Status: DC

## 2015-11-26 MED ORDER — RISPERIDONE MICROSPHERES 25 MG IM SUSR
25.0000 mg | INTRAMUSCULAR | Status: DC
  Filled 2015-11-26: qty 2

## 2015-11-26 MED ORDER — SEVELAMER CARBONATE 800 MG PO TABS
3200.0000 mg | ORAL_TABLET | Freq: Three times a day (TID) | ORAL | Status: DC
  Administered 2015-11-26: 3200 mg via ORAL
  Filled 2015-11-26 (×7): qty 4

## 2015-11-26 MED ORDER — ACETAMINOPHEN 325 MG PO TABS: 650.0000 mg | ORAL_TABLET | Freq: Four times a day (QID) | ORAL | Status: DC | PRN
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
## 2015-11-26 NOTE — Clinical Social Work Note (Signed)
CSW spoke with [facility] in [facility] this morning and they have refused to accept pt there. Will follow up with [facility] regarding further efforts.  Benay Pike, [place]

## 2015-11-26 NOTE — ED Triage Notes (Signed)
Pt reports leg pain since yesterday and reports was last dialyzed on Friday. nad noted.
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
## 2015-11-26 NOTE — Procedures (Signed)
   HEMODIALYSIS TREATMENT NOTE:  4.5 hour low-heparin dialysis completed via right upper arm AVF (15g ante/retrograde). Goal met: 4 liters removed without interruption in ultrafiltration. Pt states she intends to leave hospital after dialysis.  Signed AMA waiver.  Discharged ambulatory with steady gait, standing BP 155/84.  Rockwell Alexandria, RN, CDN
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
## 2015-11-26 NOTE — ED Notes (Addendum)
Per Levada Dy, Dialysis RN, pt to be brought from ED to dialysis and go to Med-Surg room post dialysis. Pt and EDP aware.

## 2015-11-27 NOTE — Clinical Social Work Note (Signed)
CSW spoke with Sara [name], [facility] who reports that their facility in [place] is unable to take pt. No further suggestions provided at this time.  Sara [name], [place]

## 2015-11-27 NOTE — Discharge Summary (Signed)
Physician Discharge Summary  Sara [name] [id number] DOB: 07-12-1980 DOA: 11/26/2015  PCP: Pcp Not In System  Admit date: 11/26/2015 Discharge date: 11/27/2015  Admitted From: Home Disposition:  Home  Patient left AGAINST MEDICAL ADVICE  Brief/Interim Summary:  35 y.o. female with medical history significant of end-stage renal disease on Monday Wednesday Friday hemodialysis, schizoaffective disorder, systemic lupus, recent C. difficile colitis for which patient is taking by mouth vancomycin presents to the emergency department stating she is due for dialysis today. Denies shortness of breath or chest pains.    1. End-stage renal disease 1. Remained stable 2. Nephrology was consulted 3. Patient underwent dialysis and 11/26/2015 4. Shortly after dialysis, patient elected to leave [place] before returning to floor. 2. Schizoaffective disorder 1. Appear stable at present 3. Noncompliance 1. See prior notes. 2. Multiple prior issues with noncompliance 4. Systemic lupus 1. Seems stable at present 5. Recent history of C. Difficile 1. Patient continuing by mouth vancomycin, anticipated to complete course by 11/27/2015 (started on 11/13/2015) 2. Stable with no issues with diarrhea  Discharge Diagnoses:  Principal Problem:   ESRD on hemodialysis ([place]) Active Problems:   Schizoaffective disorder, bipolar type ([place])   Systemic lupus ([place])   ESRD (end stage renal disease) ([place])   Noncompliance    Discharge Instructions     Medication List    ASK your doctor about these medications   acetaminophen 500 MG tablet Commonly known as:  TYLENOL Take 1,000 mg by mouth every 6 (six) hours as needed for moderate pain. Reported on 06/12/2015   furosemide 40 MG tablet Commonly known as:  LASIX Take 3 tablets (120 mg total) by mouth daily.   metoprolol 50 MG tablet Commonly known as:  LOPRESSOR Take 1 tablet (50 mg total) by mouth 2 (two) times daily.    predniSONE 20 MG tablet Commonly known as:  DELTASONE Take 1 tablet (20 mg total) by mouth daily with breakfast.   risperiDONE microspheres 25 MG injection Commonly known as:  RISPERDAL CONSTA Inject 25 mg into the muscle every 14 (fourteen) days.   sevelamer carbonate 800 MG tablet Commonly known as:  RENVELA Take 4 tablets (3,200 mg total) by mouth 3 (three) times daily with meals.   traMADol 50 MG tablet Commonly known as:  ULTRAM Take 1 tablet (50 mg total) by mouth every 6 (six) hours as needed for moderate pain.   vancomycin 125 MG capsule Commonly known as:  VANCOCIN TAKE ONE CAPSULE BY MOUTH 4 TIMES A DAY FOR 14 DAYS       Allergies  Allergen Reactions  . Ativan [Lorazepam] Swelling and Other (See Comments)    Dysarthria(patient has difficulty speaking and slurred speech); denies swelling, itching, pain, or numbness.  [name] [Ziprasidone Hcl] Itching and Swelling    Tongue swelling  . Keflex [Cephalexin] Swelling and Other (See Comments)    Tongue swelling. Can't talk   . Haldol [Haloperidol Lactate] Swelling    Tongue swelling. 05/31/15 - MD ok with giving as pt has tolerated in the past Pt can take benadryl.  . Other Itching    wool    Consultations:  Nephrology  Procedures/Studies: Dg Chest 2 View  Result Date: 10/29/2015 CLINICAL DATA:  Shortness of breath.  Chronic renal failure EXAM: CHEST  2 VIEW COMPARISON:  September 28, 2015 FINDINGS: There is no edema or consolidation. There is cardiomegaly with pulmonary vascularity within normal limits. No adenopathy. There is erosive change in the distal clavicles with extensive  bony hypertrophy in these areas, stable. IMPRESSION: Persistent cardiomegaly. No edema or consolidation. Changes in the distal clavicles most likely are due to secondary hyperparathyroidism associated with chronic renal failure. Electronically Signed   By: Lowella Grip III M.D.   On: 10/29/2015 09:56  Dg Finger Thumb Left  Result Date:  11/02/2015 CLINICAL DATA:  Left thumb pain. EXAM: LEFT THUMB 2+V COMPARISON:  None. FINDINGS: There is no evidence of fracture or dislocation. There is no evidence of arthropathy or other focal bone abnormality. Soft tissues demonstrate diffuse edema. IMPRESSION: No acute fracture or dislocation identified about the left thumb. Diffuse soft tissue edema. Electronically Signed   By: Fidela Salisbury M.D.   On: 11/02/2015 08:54    Patient left AGAINST MEDICAL ADVICE  Discharge Exam: Vitals:   11/26/15 1355 11/26/15 1409  BP: 155/90 159/83  Pulse: 100 102  Resp:  16  Temp:  97.9 F (36.6 C)   Vitals:   11/26/15 1300 11/26/15 1330 11/26/15 1355 11/26/15 1409  BP: 113/97 151/80 155/90 159/83  Pulse: 115 101 100 102  Resp:    16  Temp:    97.9 F (36.6 C)  TempSrc:    Oral  SpO2:      Weight:      Height:        Physical exam: Left AGAINST MEDICAL ADVICE   The results of significant diagnostics from this hospitalization (including imaging, microbiology, ancillary and laboratory) are listed below for reference.     Microbiology: No results found for this or any previous visit (from the past 240 hour(s)).   Labs: BNP (last 3 results) No results for input(s): BNP in the last 8760 hours. Basic Metabolic Panel:  Recent Labs Lab 11/21/15 1245 11/23/15 1027 11/26/15 1127  NA  --  134*  137 135  K  --  4.3  4.3 4.5  CL  --  100*  97* 99*  CO2  --  26 26  GLUCOSE  --  88  88 76  BUN  --  38*  34* 51*  CREATININE 9.03* 8.47*  8.56*  8.10* 11.00*  CALCIUM  --  8.6* 8.5*  PHOS  --  6.1* 6.7*   Liver Function Tests:  Recent Labs Lab 11/23/15 1027 11/26/15 1127  ALBUMIN 3.0* 3.0*   No results for input(s): LIPASE, AMYLASE in the last 168 hours. No results for input(s): AMMONIA in the last 168 hours. CBC:  Recent Labs Lab 11/21/15 1245 11/23/15 1027 11/26/15 1127  WBC 6.9 6.9 8.5  HGB 8.5* 9.0*  9.9* 8.3*  HCT 26.8* 28.4*  29.0* 25.9*  MCV 87.3  88.2 86.9  PLT 289 237 200   Cardiac Enzymes: No results for input(s): CKTOTAL, CKMB, CKMBINDEX, TROPONINI in the last 168 hours. BNP: Invalid input(s): POCBNP CBG: No results for input(s): GLUCAP in the last 168 hours. D-Dimer No results for input(s): DDIMER in the last 72 hours. Hgb A1c No results for input(s): HGBA1C in the last 72 hours. Lipid Profile No results for input(s): CHOL, HDL, LDLCALC, TRIG, CHOLHDL, LDLDIRECT in the last 72 hours. Thyroid function studies No results for input(s): TSH, T4TOTAL, T3FREE, THYROIDAB in the last 72 hours.  Invalid input(s): FREET3 Anemia work up No results for input(s): VITAMINB12, FOLATE, FERRITIN, TIBC, IRON, RETICCTPCT in the last 72 hours. Urinalysis    Component Value Date/Time   COLORURINE YELLOW 08/11/2015 1235   APPEARANCEUR HAZY (A) 08/11/2015 1235   LABSPEC 1.015 08/11/2015 1235   PHURINE 7.5 08/11/2015 1235  GLUCOSEU 100 (A) 08/11/2015 1235   HGBUR SMALL (A) 08/11/2015 1235   BILIRUBINUR NEGATIVE 08/11/2015 1235   KETONESUR NEGATIVE 08/11/2015 1235   PROTEINUR >300 (A) 08/11/2015 1235   UROBILINOGEN 0.2 01/09/2015 1645   NITRITE NEGATIVE 08/11/2015 1235   LEUKOCYTESUR SMALL (A) 08/11/2015 1235   Sepsis Labs Invalid input(s): PROCALCITONIN,  WBC,  LACTICIDVEN Microbiology No results found for this or any previous visit (from the past 240 hour(s)).   SIGNED:   Donne Hazel, MD  [facility] 11/27/2015, 5:18 PM  If 7PM-7AM, please contact night-coverage [email or web address] Password [name]

## 2015-11-28 ENCOUNTER — Observation Stay (HOSPITAL_COMMUNITY)
Admission: EM | Admit: 2015-11-28 | Discharge: 2015-11-28 | Disposition: A | Discharge status: Home / Self Care | Payer: Medicare Other | Attending: Internal Medicine | Admitting: Internal Medicine

## 2015-11-28 ENCOUNTER — Encounter (HOSPITAL_COMMUNITY): Payer: Self-pay

## 2015-11-28 DIAGNOSIS — I313 Pericardial effusion (noninflammatory): Secondary | ICD-10-CM | POA: Diagnosis not present

## 2015-11-28 DIAGNOSIS — N643 Galactorrhea not associated with childbirth: Secondary | ICD-10-CM | POA: Insufficient documentation

## 2015-11-28 DIAGNOSIS — Z888 Allergy status to other drugs, medicaments and biological substances status: Secondary | ICD-10-CM | POA: Insufficient documentation

## 2015-11-28 DIAGNOSIS — I132 Hypertensive heart and chronic kidney disease with heart failure and with stage 5 chronic kidney disease, or end stage renal disease: Secondary | ICD-10-CM | POA: Diagnosis not present

## 2015-11-28 DIAGNOSIS — Z9119 Patient's noncompliance with other medical treatment and regimen: Secondary | ICD-10-CM | POA: Insufficient documentation

## 2015-11-28 DIAGNOSIS — I5042 Chronic combined systolic (congestive) and diastolic (congestive) heart failure: Secondary | ICD-10-CM | POA: Diagnosis not present

## 2015-11-28 DIAGNOSIS — F250 Schizoaffective disorder, bipolar type: Secondary | ICD-10-CM | POA: Diagnosis not present

## 2015-11-28 DIAGNOSIS — E875 Hyperkalemia: Secondary | ICD-10-CM | POA: Insufficient documentation

## 2015-11-28 DIAGNOSIS — I120 Hypertensive chronic kidney disease with stage 5 chronic kidney disease or end stage renal disease: Secondary | ICD-10-CM | POA: Diagnosis not present

## 2015-11-28 DIAGNOSIS — Z881 Allergy status to other antibiotic agents status: Secondary | ICD-10-CM | POA: Insufficient documentation

## 2015-11-28 DIAGNOSIS — N912 Amenorrhea, unspecified: Secondary | ICD-10-CM | POA: Diagnosis not present

## 2015-11-28 DIAGNOSIS — E8770 Fluid overload, unspecified: Secondary | ICD-10-CM | POA: Diagnosis not present

## 2015-11-28 DIAGNOSIS — N2581 Secondary hyperparathyroidism of renal origin: Secondary | ICD-10-CM | POA: Insufficient documentation

## 2015-11-28 DIAGNOSIS — Z9114 Patient's other noncompliance with medication regimen: Secondary | ICD-10-CM | POA: Diagnosis not present

## 2015-11-28 DIAGNOSIS — F17210 Nicotine dependence, cigarettes, uncomplicated: Secondary | ICD-10-CM | POA: Insufficient documentation

## 2015-11-28 DIAGNOSIS — M545 Low back pain: Secondary | ICD-10-CM | POA: Diagnosis not present

## 2015-11-28 DIAGNOSIS — E872 Acidosis: Secondary | ICD-10-CM | POA: Diagnosis not present

## 2015-11-28 DIAGNOSIS — Z8489 Family history of other specified conditions: Secondary | ICD-10-CM | POA: Insufficient documentation

## 2015-11-28 DIAGNOSIS — I10 Essential (primary) hypertension: Secondary | ICD-10-CM | POA: Diagnosis not present

## 2015-11-28 DIAGNOSIS — Z79899 Other long term (current) drug therapy: Secondary | ICD-10-CM | POA: Insufficient documentation

## 2015-11-28 DIAGNOSIS — G8929 Other chronic pain: Secondary | ICD-10-CM | POA: Insufficient documentation

## 2015-11-28 DIAGNOSIS — Z841 Family history of disorders of kidney and ureter: Secondary | ICD-10-CM | POA: Insufficient documentation

## 2015-11-28 DIAGNOSIS — N186 End stage renal disease: Secondary | ICD-10-CM | POA: Diagnosis not present

## 2015-11-28 DIAGNOSIS — D631 Anemia in chronic kidney disease: Secondary | ICD-10-CM | POA: Diagnosis not present

## 2015-11-28 DIAGNOSIS — A6000 Herpesviral infection of urogenital system, unspecified: Secondary | ICD-10-CM | POA: Diagnosis not present

## 2015-11-28 DIAGNOSIS — N760 Acute vaginitis: Secondary | ICD-10-CM | POA: Insufficient documentation

## 2015-11-28 DIAGNOSIS — L02414 Cutaneous abscess of left upper limb: Secondary | ICD-10-CM | POA: Diagnosis not present

## 2015-11-28 DIAGNOSIS — M3214 Glomerular disease in systemic lupus erythematosus: Secondary | ICD-10-CM | POA: Insufficient documentation

## 2015-11-28 DIAGNOSIS — Z992 Dependence on renal dialysis: Secondary | ICD-10-CM

## 2015-11-28 LAB — RENAL FUNCTION PANEL
Albumin: 3.1 g/dL — ABNORMAL LOW (ref 3.5–5.0)
Anion gap: 10 (ref 5–15)
BUN: 48 mg/dL — ABNORMAL HIGH (ref 6–20)
CO2: 27 mmol/L (ref 22–32)
Calcium: 9.1 mg/dL (ref 8.9–10.3)
Chloride: 97 mmol/L — ABNORMAL LOW (ref 101–111)
Creatinine, Ser: 8.96 mg/dL — ABNORMAL HIGH (ref 0.44–1.00)
GFR calc Af Amer: 6 mL/min — ABNORMAL LOW (ref >60)
GFR calc non Af Amer: 5 mL/min — ABNORMAL LOW (ref >60)
Glucose, Bld: 103 mg/dL — ABNORMAL HIGH (ref 65–99)
Phosphorus: 6 mg/dL — ABNORMAL HIGH (ref 2.5–4.6)
Potassium: 4.7 mmol/L (ref 3.5–5.1)
Sodium: 134 mmol/L — ABNORMAL LOW (ref 135–145)

## 2015-11-28 LAB — CBC
HCT: 27.4 % — ABNORMAL LOW (ref 36.0–46.0)
Hemoglobin: 8.7 g/dL — ABNORMAL LOW (ref 12.0–15.0)
MCH: 27.4 pg (ref 26.0–34.0)
MCHC: 31.8 g/dL (ref 30.0–36.0)
MCV: 86.4 fL (ref 78.0–100.0)
Platelets: 199 10*3/uL (ref 150–400)
RBC: 3.17 MIL/uL — ABNORMAL LOW (ref 3.87–5.11)
RDW: 16.4 % — ABNORMAL HIGH (ref 11.5–15.5)
WBC: 7 10*3/uL (ref 4.0–10.5)

## 2015-11-28 MED ORDER — PENTAFLUOROPROP-TETRAFLUOROETH EX AERO: 1.0000 "application " | INHALATION_SPRAY | CUTANEOUS | Status: DC | PRN

## 2015-11-28 MED ORDER — ALTEPLASE 2 MG IJ SOLR: 2.0000 mg | Freq: Once | INTRAMUSCULAR | Status: DC | PRN

## 2015-11-28 MED ORDER — SEVELAMER CARBONATE 800 MG PO TABS: 3200.0000 mg | ORAL_TABLET | Freq: Three times a day (TID) | ORAL | Status: DC

## 2015-11-28 MED ORDER — PREDNISONE 20 MG PO TABS: 20.0000 mg | ORAL_TABLET | Freq: Every day | ORAL | Status: DC

## 2015-11-28 MED ORDER — SODIUM CHLORIDE 0.9 % IV SOLN: 100.0000 mL | INTRAVENOUS | Status: DC | PRN

## 2015-11-28 MED ORDER — PREDNISONE 20 MG PO TABS: 20.0000 mg | ORAL_TABLET | Freq: Every day | ORAL | 6 refills | Status: AC

## 2015-11-28 MED ORDER — LIDOCAINE-PRILOCAINE 2.5-2.5 % EX CREA: 1.0000 "application " | TOPICAL_CREAM | CUTANEOUS | Status: DC | PRN

## 2015-11-28 MED ORDER — LIDOCAINE HCL (PF) 1 % IJ SOLN: 5.0000 mL | INTRAMUSCULAR | Status: DC | PRN

## 2015-11-28 MED ORDER — ACETAMINOPHEN 500 MG PO TABS: 1000.0000 mg | ORAL_TABLET | Freq: Four times a day (QID) | ORAL | Status: DC | PRN

## 2015-11-28 MED ORDER — RISPERIDONE MICROSPHERES 25 MG IM SUSR: 25.0000 mg | INTRAMUSCULAR | Status: DC

## 2015-11-28 MED ORDER — EPOETIN ALFA 10000 UNIT/ML IJ SOLN
6000.0000 [IU] | INTRAMUSCULAR | Status: DC
  Administered 2015-11-28: 6000 [IU] via SUBCUTANEOUS
  Filled 2015-11-28 (×2): qty 1

## 2015-11-28 MED ORDER — FUROSEMIDE 80 MG PO TABS: 120.0000 mg | ORAL_TABLET | Freq: Every day | ORAL | Status: DC

## 2015-11-28 MED ORDER — TRAMADOL HCL 50 MG PO TABS: 50.0000 mg | ORAL_TABLET | Freq: Four times a day (QID) | ORAL | Status: DC | PRN

## 2015-11-28 MED ORDER — VANCOMYCIN HCL 125 MG PO CAPS: 125.0000 mg | ORAL_CAPSULE | Freq: Four times a day (QID) | ORAL | Status: DC

## 2015-11-28 MED ORDER — HEPARIN SODIUM (PORCINE) 1000 UNIT/ML DIALYSIS: 20.0000 [IU]/kg | INTRAMUSCULAR | Status: DC | PRN

## 2015-11-28 MED ORDER — METOPROLOL TARTRATE 50 MG PO TABS: 50.0000 mg | ORAL_TABLET | Freq: Two times a day (BID) | ORAL | Status: DC

## 2015-11-28 MED ORDER — LIDOCAINE HCL (PF) 1 % IJ SOLN
INTRAMUSCULAR | Status: AC
  Filled 2015-11-28: qty 5

## 2015-11-28 MED ORDER — HEPARIN SODIUM (PORCINE) 1000 UNIT/ML DIALYSIS: 1000.0000 [IU] | INTRAMUSCULAR | Status: DC | PRN

## 2015-11-28 MED ORDER — EPOETIN ALFA 10000 UNIT/ML IJ SOLN
INTRAMUSCULAR | Status: AC
  Administered 2015-11-28: 6000 [IU] via SUBCUTANEOUS
  Filled 2015-11-28: qty 1

## 2015-11-28 NOTE — Consult Note (Signed)
PRANATHI DORAN MRN: [id number] DOB/AGE: 1980-04-29 35 y.o. Primary Care Physician:Pcp Not In System Admit date: 11/28/2015 Chief Complaint:  Chief Complaint  Patient presents with  . Other    Dialysis   HPI: Pt is 35 year old female with past medical hx of ESRD who was admitted with c/o dyspnea," I need dialysis "  HPI dates back to few days ago started feeling short of breath,and so pt came to ER asking for her dialysis. Pt says " I need my dialysis .I am full of fluid. I had my dialysis this last Monday on  11/26/15 ". No c/o fever/cough/chills NO c/o nausea/vomiting. NO c/o abdominal pain. No c/o hematuria     Past Medical History:  Diagnosis Date  . Acute myopericarditis    hx/notes 10/09/2014  . Bipolar disorder ([place])    [name] 10/09/2014  . CHF (congestive heart failure) ([place])    systolic/notes [id number]  . Chronic anemia    [name] 10/09/2014  . ESRD (end stage renal disease) on dialysis [facility])    "[name]; [facility]" (10/09/2014)  . History of blood transfusion    "this is probably my 3rd" (10/09/2014)  . Hypertension   . Low back pain   . Lupus ([place])    lupus w nephritis  . 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 Non-compliant patient   . Positive ANA (antinuclear antibody) 08/16/2012  . 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 Pregnancy   . Psychosis   . Schizoaffective disorder, bipolar type ([place]) 11/20/2014  . Schizophrenia ([place])    [name] 10/09/2014  . Tobacco abuse 02/20/2014        Family History  Problem Relation Age of Onset  . Drug abuse Father   . Kidney disease Father     Social History:  reports that she has been smoking Cigarettes.  She has a 2.00 pack-year smoking history. She has never used smokeless tobacco. She reports that she does not drink alcohol or use drugs.   Allergies:  Allergies  Allergen Reactions  . Ativan [Lorazepam] Swelling and Other (See Comments)    Dysarthria(patient has difficulty speaking and slurred speech); denies swelling, itching,  pain, or numbness.  [name] [Ziprasidone Hcl] Itching and Swelling    Tongue swelling  . Keflex [Cephalexin] Swelling and Other (See Comments)    Tongue swelling. Can't talk   . Haldol [Haloperidol Lactate] Swelling    Tongue swelling. 05/31/15 - MD ok with giving as pt has tolerated in the past Pt can take benadryl.  . Other Itching    wool     (Not in a hospital admission)     [id number] from the symptoms mentioned above,there are no other symptoms referable to all systems reviewed.      Physical Exam: Vital signs in last 24 hours: Temp:  [97.8 F (36.6 C)] 97.8 F (36.6 C) (08/23 0758) Pulse Rate:  [107] 107 (08/23 0758) Resp:  [16] 16 (08/23 0758) BP: (145)/(97) 145/97 (08/23 0758) SpO2:  [100 %] 100 % (08/23 0758) Weight:  [163 lb 11.2 oz (74.3 kg)] 163 lb 11.2 oz (74.3 kg) (08/23 0758) Weight change:     Intake/Output from previous day: No intake/output data recorded. No intake/output data recorded.   Physical Exam: General- pt is awake,alert, follows coomands Resp- No acute REsp distress,  decreased bs at bases. CVS- S1S2 regular in rate and rhythm, NO rubs  GIT- BS+, soft, NT, ND EXT- 2+ LE Edema, NO Cyanosis CNS- CN 2-12 grossly intact. Moving all 4 extremities  Access- AVF+, aneurysm present    Lab Results:  CBC    Component Value Date/Time   WBC 8.5 11/26/2015 1127   RBC 2.98 (L) 11/26/2015 1127   HGB 8.3 (L) 11/26/2015 1127   HCT 25.9 (L) 11/26/2015 1127   PLT 200 11/26/2015 1127   MCV 86.9 11/26/2015 1127   MCH 27.9 11/26/2015 1127   MCHC 32.0 11/26/2015 1127   RDW 16.7 (H) 11/26/2015 1127   LYMPHSABS 1.0 09/21/2015 0841   MONOABS 0.7 09/21/2015 0841   EOSABS 0.1 09/21/2015 0841   BASOSABS 0.0 09/21/2015 0841      BMET  Recent Labs  11/26/15 1127  NA 135  K 4.5  CL 99*  CO2 26  GLUCOSE 76  BUN 51*  CREATININE 11.00*  CALCIUM 8.5*    MICRO No results found for this or any previous visit (from the past 240 hour(s)).     Lab Results  Component Value Date   PTH 1,100 (H) 11/19/2015   PTH Comment 11/19/2015   CALCIUM 8.5 (L) 11/26/2015   CAION 1.17 11/23/2015   PHOS 6.7 (H) 11/26/2015      Impression: 1)Renal  ESRD on HD                Pt is not on regular  Schedule sec to her complaince/adherence issues                Will dialyze pt today  2)HTN Bp is not at goal  3)Anemia In ESRD the goal for HGb is 9--11. 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   Not at goal sec to non adherence to hd tx Phosphorus not at goal.  on binders  5)Psych .  Hx of   psycosis Schizophrenia Primary MD following  6)Electrolytes  Hyperkalemic  Normonatremic   7)Acid base Co2  at goal    Plan:  Will dialyze today Will use 2 k bath Will keep on epo Will try to take 2.5-3 liters off if possible   BHUTANI,MANPREET S 11/28/2015, 10:38 AM

## 2015-11-28 NOTE — ED Triage Notes (Signed)
Patient states she is here for HD, denies chest pain, denies shortness of breath, denies vascular access problems. Last HD session was on Monday.
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
## 2015-11-28 NOTE — ED Notes (Signed)
Spoke to [employer], she states she has spoken to Nephrology and the Hospitalist and that patient should go straight to dialysis from ED and avoid an inpatient bed. Spoke to Alianza, Dialysis nurse who referred me to [facility] HD RN at [facility] Erasmo Downer states Nephrology will see patient and transcribe orders and once those orders are transcribed, patient can go to room 208 for dialysis.

## 2015-11-28 NOTE — Discharge Summary (Signed)
Physician Discharge Summary  Sara [name] [name] DOB: 03/28/1981 DOA: 11/28/2015  PCP: Pcp Not In System  Admit date: 11/28/2015 Discharge date: 11/28/2015  Time spent: 45 minutes  Recommendations for Outpatient Follow-up:  -We'll be discharged home today.   Discharge Diagnoses:  Principal Problem:   ESRD (end stage renal disease) on dialysis [facility]) Active Problems:   ESRD (end stage renal disease) ([place])   Discharge Condition: Stable and improved  Filed Weights   11/28/15 0758  Weight: 74.3 kg (163 lb 11.2 oz)    History of present illness:  Admitted for dialysis purposes, no complications.  Hospital Course:   End-stage renal disease -Completed dialysis without adverse events. -We be discharged home today.  Procedures:  Hemodialysis   Consultations:  Nephrology  Discharge Instructions  Discharge Instructions    Diet - low sodium heart healthy    Complete by:  As directed   Increase activity slowly    Complete by:  As directed       Medication List    TAKE these medications   acetaminophen 500 MG tablet Commonly known as:  TYLENOL Take 1,000 mg by mouth every 6 (six) hours as needed for moderate pain. Reported on 06/12/2015   furosemide 40 MG tablet Commonly known as:  LASIX Take 3 tablets (120 mg total) by mouth daily.   metoprolol 50 MG tablet Commonly known as:  LOPRESSOR Take 1 tablet (50 mg total) by mouth 2 (two) times daily.   predniSONE 20 MG tablet Commonly known as:  DELTASONE Take 1 tablet (20 mg total) by mouth daily with breakfast.   risperiDONE microspheres 25 MG injection Commonly known as:  RISPERDAL CONSTA Inject 25 mg into the muscle every 14 (fourteen) days.   sevelamer carbonate 800 MG tablet Commonly known as:  RENVELA Take 4 tablets (3,200 mg total) by mouth 3 (three) times daily with meals.   traMADol 50 MG tablet Commonly known as:  ULTRAM Take 1 tablet (50 mg total) by mouth every 6 (six) hours as  needed for moderate pain.   vancomycin 125 MG capsule Commonly known as:  VANCOCIN TAKE ONE CAPSULE BY MOUTH 4 TIMES A DAY FOR 14 DAYS      Allergies  Allergen Reactions  . Ativan [Lorazepam] Swelling and Other (See Comments)    Dysarthria(patient has difficulty speaking and slurred speech); denies swelling, itching, pain, or numbness.  [name] [Ziprasidone Hcl] Itching and Swelling    Tongue swelling  . Keflex [Cephalexin] Swelling and Other (See Comments)    Tongue swelling. Can't talk   . Haldol [Haloperidol Lactate] Swelling    Tongue swelling. 05/31/15 - MD ok with giving as pt has tolerated in the past Pt can take benadryl.  . Other Itching    wool      The results of significant diagnostics from this hospitalization (including imaging, microbiology, ancillary and laboratory) are listed below for reference.    Significant Diagnostic Studies: Dg Finger Thumb Left  Result Date: 11/02/2015 CLINICAL DATA:  Left thumb pain. EXAM: LEFT THUMB 2+V COMPARISON:  None. FINDINGS: There is no evidence of fracture or dislocation. There is no evidence of arthropathy or other focal bone abnormality. Soft tissues demonstrate diffuse edema. IMPRESSION: No acute fracture or dislocation identified about the left thumb. Diffuse soft tissue edema. Electronically Signed   By: Fidela Salisbury M.D.   On: 11/02/2015 08:54   Microbiology: No results found for this or any previous visit (from the past 240 hour(s)).  Labs: Basic Metabolic Panel:  Recent Labs Lab 11/23/15 1027 11/26/15 1127 11/28/15 1120  NA 134*  137 135 134*  K 4.3  4.3 4.5 4.7  CL 100*  97* 99* 97*  CO2 26 26 27   GLUCOSE 88  88 76 103*  BUN 38*  34* 51* 48*  CREATININE 8.47*  8.56*  8.10* 11.00* 8.96*  CALCIUM 8.6* 8.5* 9.1  PHOS 6.1* 6.7* 6.0*   Liver Function Tests:  Recent Labs Lab 11/23/15 1027 11/26/15 1127 11/28/15 1120  ALBUMIN 3.0* 3.0* 3.1*   No results for input(s): LIPASE, AMYLASE in  the last 168 hours. No results for input(s): AMMONIA in the last 168 hours. CBC:  Recent Labs Lab 11/23/15 1027 11/26/15 1127 11/28/15 1120  WBC 6.9 8.5 7.0  HGB 9.0*  9.9* 8.3* 8.7*  HCT 28.4*  29.0* 25.9* 27.4*  MCV 88.2 86.9 86.4  PLT 237 200 199   Cardiac Enzymes: No results for input(s): CKTOTAL, CKMB, CKMBINDEX, TROPONINI in the last 168 hours. BNP: BNP (last 3 results) No results for input(s): BNP in the last 8760 hours.  ProBNP (last 3 results) No results for input(s): PROBNP in the last 8760 hours.  CBG: No results for input(s): GLUCAP in the last 168 hours.     SignedLelon Frohlich  [facility] Pager: [phone] 11/28/2015, 1:36 PM
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
## 2015-11-28 NOTE — ED Notes (Signed)
Nephrologist at bedside

## 2015-11-28 NOTE — Progress Notes (Signed)
Hemodialysis- Pt arrived from ED in stable condition via wheelchair. Orders received from Dr. Rosilyn Mings to initiate dialysis.Pt states she will be leaving the hospital from the dialysis unit. Currently resting comfortably in HD recliner. Continue to monitor.

## 2015-11-28 NOTE — Progress Notes (Addendum)
Hemodialysis- Pt bent access arm attempting to cover up with a blanket. Treatment paused d/t severe pain in venous needle site. Small infiltration noted. Unable to cannulate higher on arm d/t anatomy of avf. All blood was returned via arterial needle at very low rate. Pt received 2 hours of HD and UF 1.7L. Pt does not want access arm evaluated and wants to leave hospital. Dr. Theador Hawthorne notified. Post HD vitals remain stable. BP 159/68, HR 91, RR 16, T 98.6. No complaints of pain once needle removed from infiltration site. Primary MD notified as well. Pt left HD unit on foot in stable condition. Advised pt to apply ice to area this evening to minimize swelling. AMA form signed.

## 2015-11-30 ENCOUNTER — Encounter (HOSPITAL_COMMUNITY): Payer: Self-pay | Admitting: Emergency Medicine

## 2015-11-30 ENCOUNTER — Observation Stay (HOSPITAL_COMMUNITY)
Admission: EM | Admit: 2015-11-30 | Discharge: 2015-11-30 | Disposition: A | Discharge status: Home / Self Care | Payer: Medicare Other | Attending: Family Medicine | Admitting: Family Medicine

## 2015-11-30 DIAGNOSIS — Z79899 Other long term (current) drug therapy: Secondary | ICD-10-CM | POA: Diagnosis not present

## 2015-11-30 DIAGNOSIS — I132 Hypertensive heart and chronic kidney disease with heart failure and with stage 5 chronic kidney disease, or end stage renal disease: Principal | ICD-10-CM | POA: Insufficient documentation

## 2015-11-30 DIAGNOSIS — F17210 Nicotine dependence, cigarettes, uncomplicated: Secondary | ICD-10-CM | POA: Insufficient documentation

## 2015-11-30 DIAGNOSIS — I5022 Chronic systolic (congestive) heart failure: Secondary | ICD-10-CM | POA: Insufficient documentation

## 2015-11-30 DIAGNOSIS — N186 End stage renal disease: Secondary | ICD-10-CM | POA: Diagnosis not present

## 2015-11-30 DIAGNOSIS — I10 Essential (primary) hypertension: Secondary | ICD-10-CM | POA: Diagnosis not present

## 2015-11-30 DIAGNOSIS — E8770 Fluid overload, unspecified: Secondary | ICD-10-CM | POA: Diagnosis not present

## 2015-11-30 DIAGNOSIS — Z992 Dependence on renal dialysis: Secondary | ICD-10-CM | POA: Insufficient documentation

## 2015-11-30 DIAGNOSIS — D631 Anemia in chronic kidney disease: Secondary | ICD-10-CM | POA: Diagnosis not present

## 2015-11-30 DIAGNOSIS — I120 Hypertensive chronic kidney disease with stage 5 chronic kidney disease or end stage renal disease: Secondary | ICD-10-CM | POA: Diagnosis not present

## 2015-11-30 LAB — RENAL FUNCTION PANEL
Albumin: 3 g/dL — ABNORMAL LOW (ref 3.5–5.0)
Anion gap: 9 (ref 5–15)
BUN: 58 mg/dL — ABNORMAL HIGH (ref 6–20)
CHLORIDE: 99 mmol/L — AB (ref 101–111)
CO2: 24 mmol/L (ref 22–32)
CREATININE: 9.12 mg/dL — AB (ref 0.44–1.00)
Calcium: 8.7 mg/dL — ABNORMAL LOW (ref 8.9–10.3)
GFR calc non Af Amer: 5 mL/min — ABNORMAL LOW (ref >60)
GFR, EST AFRICAN AMERICAN: 6 mL/min — AB (ref >60)
Glucose, Bld: 75 mg/dL (ref 65–99)
Phosphorus: 5.8 mg/dL — ABNORMAL HIGH (ref 2.5–4.6)
Potassium: 4.6 mmol/L (ref 3.5–5.1)
Sodium: 132 mmol/L — ABNORMAL LOW (ref 135–145)

## 2015-11-30 LAB — CBC
HCT: 26.4 % — ABNORMAL LOW (ref 36.0–46.0)
HEMOGLOBIN: 8.4 g/dL — AB (ref 12.0–15.0)
MCH: 27.4 pg (ref 26.0–34.0)
MCHC: 31.8 g/dL (ref 30.0–36.0)
MCV: 86 fL (ref 78.0–100.0)
Platelets: 247 10*3/uL (ref 150–400)
RBC: 3.07 MIL/uL — AB (ref 3.87–5.11)
RDW: 16.8 % — ABNORMAL HIGH (ref 11.5–15.5)
WBC: 7.4 10*3/uL (ref 4.0–10.5)

## 2015-11-30 MED ORDER — SODIUM CHLORIDE 0.9 % IV SOLN: 100.0000 mL | INTRAVENOUS | Status: DC | PRN

## 2015-11-30 MED ORDER — EPOETIN ALFA 10000 UNIT/ML IJ SOLN
14000.0000 [IU] | INTRAMUSCULAR | Status: DC
  Administered 2015-11-30: 14000 [IU] via INTRAVENOUS
  Filled 2015-11-30 (×2): qty 2

## 2015-11-30 MED ORDER — DIPHENHYDRAMINE HCL 25 MG PO CAPS
25.0000 mg | ORAL_CAPSULE | Freq: Four times a day (QID) | ORAL | Status: DC | PRN
  Administered 2015-11-30: 25 mg via ORAL

## 2015-11-30 MED ORDER — EPOETIN ALFA 20000 UNIT/ML IJ SOLN
INTRAMUSCULAR | Status: AC
  Administered 2015-11-30: 14000 [IU]
  Filled 2015-11-30: qty 1

## 2015-11-30 MED ORDER — DIPHENHYDRAMINE HCL 25 MG PO CAPS
ORAL_CAPSULE | ORAL | Status: AC
  Administered 2015-11-30: 25 mg via ORAL
  Filled 2015-11-30: qty 1

## 2015-11-30 MED ORDER — LIDOCAINE-PRILOCAINE 2.5-2.5 % EX CREA
1.0000 | TOPICAL_CREAM | CUTANEOUS | Status: DC | PRN
Start: 2015-11-30 — End: 2015-11-30

## 2015-11-30 MED ORDER — DOXERCALCIFEROL 4 MCG/2ML IV SOLN
5.0000 ug | INTRAVENOUS | Status: DC
Start: 2015-11-30 — End: 2015-11-30
  Administered 2015-11-30: 5 ug via INTRAVENOUS
  Filled 2015-11-30 (×2): qty 4

## 2015-11-30 MED ORDER — TRAMADOL HCL 50 MG PO TABS
ORAL_TABLET | ORAL | Status: AC
  Administered 2015-11-30: 50 mg
  Filled 2015-11-30: qty 1

## 2015-11-30 MED ORDER — SEVELAMER CARBONATE 800 MG PO TABS
ORAL_TABLET | ORAL | Status: AC
  Administered 2015-11-30: 3200 mg via ORAL
  Filled 2015-11-30: qty 4

## 2015-11-30 MED ORDER — HEPARIN SODIUM (PORCINE) 1000 UNIT/ML DIALYSIS
20.0000 [IU]/kg | INTRAMUSCULAR | Status: DC | PRN
  Administered 2015-11-30: 1500 [IU] via INTRAVENOUS_CENTRAL
  Filled 2015-11-30 (×2): qty 2

## 2015-11-30 MED ORDER — TRAMADOL HCL 50 MG PO TABS: 50.0000 mg | ORAL_TABLET | Freq: Four times a day (QID) | ORAL | Status: DC | PRN

## 2015-11-30 MED ORDER — PENTAFLUOROPROP-TETRAFLUOROETH EX AERO
1.0000 | INHALATION_SPRAY | CUTANEOUS | Status: DC | PRN
Start: 2015-11-30 — End: 2015-11-30

## 2015-11-30 MED ORDER — LIDOCAINE HCL (PF) 1 % IJ SOLN
INTRAMUSCULAR | Status: AC
  Administered 2015-11-30: 0.5 mL via INTRADERMAL
  Filled 2015-11-30: qty 5

## 2015-11-30 MED ORDER — SEVELAMER CARBONATE 800 MG PO TABS
3200.0000 mg | ORAL_TABLET | Freq: Three times a day (TID) | ORAL | Status: DC
  Administered 2015-11-30: 3200 mg via ORAL
  Filled 2015-11-30 (×10): qty 4

## 2015-11-30 MED ORDER — LIDOCAINE HCL (PF) 1 % IJ SOLN
5.0000 mL | INTRAMUSCULAR | Status: DC | PRN
  Administered 2015-11-30: 0.5 mL via INTRADERMAL

## 2015-11-30 MED ORDER — DOXERCALCIFEROL 4 MCG/2ML IV SOLN
INTRAVENOUS | Status: AC
  Administered 2015-11-30: 5 ug via INTRAVENOUS
  Filled 2015-11-30: qty 4

## 2015-11-30 MED ORDER — HEPARIN SODIUM (PORCINE) 1000 UNIT/ML IJ SOLN
INTRAMUSCULAR | Status: AC
  Administered 2015-11-30: 1500 [IU] via INTRAVENOUS_CENTRAL
  Filled 2015-11-30: qty 6

## 2015-11-30 NOTE — Procedures (Signed)
   HEMODIALYSIS TREATMENT NOTE:  4.5 hour low-heparin dialysis completed via right upper arm AVF (15g ante/retrograde). Goal met: 4 liters removed without interruption in ultrafiltration.  All blood was returned and hemostasis was achieved within 10 minutes.  Report called to Sharen Hones, RN.  Rockwell Alexandria, RN, CDN
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
## 2015-11-30 NOTE — Discharge Summary (Signed)
Physician Discharge Summary  Sara [name] [name] DOB: [birth year] DOA: 11/30/2015  PCP: Pcp Not In System  Admit date: 11/30/2015 Discharge date: 11/30/2015  Admitted From: Home Disposition: Home Recommendations for Outpatient Follow-up:  1. Follow up with PCP in 1-2 weeks 2. Please obtain BMP/CBC in one week 3. Please follow up on the following pending results:  Home Health: No Equipment/Devices: No  Discharge Condition: Stable CODE STATUS:Full Diet recommendation: Renal Brief/Interim Summary: Patient admitted for inpatient dialysis. Underwent dialysis and was discharged.   Discharge Diagnoses:  Principal Problem:   ESRD (end stage renal disease) on dialysis [facility]) Active Problems:   ESRD on dialysis [facility])   Essential hypertension   Admission for dialysis ([place])   End stage renal disease ([place])    Discharge Instructions     Medication List    TAKE these medications   acetaminophen 500 MG tablet Commonly known as:  TYLENOL Take 1,000 mg by mouth every 6 (six) hours as needed for moderate pain. Reported on 06/12/2015   furosemide 40 MG tablet Commonly known as:  LASIX Take 3 tablets (120 mg total) by mouth daily.   metoprolol 50 MG tablet Commonly known as:  LOPRESSOR Take 1 tablet (50 mg total) by mouth 2 (two) times daily.   predniSONE 20 MG tablet Commonly known as:  DELTASONE Take 1 tablet (20 mg total) by mouth daily with breakfast.   risperiDONE microspheres 25 MG injection Commonly known as:  RISPERDAL CONSTA Inject 25 mg into the muscle every 14 (fourteen) days.   sevelamer carbonate 800 MG tablet Commonly known as:  RENVELA Take 4 tablets (3,200 mg total) by mouth 3 (three) times daily with meals.   traMADol 50 MG tablet Commonly known as:  ULTRAM Take 1 tablet (50 mg total) by mouth every 6 (six) hours as needed for moderate pain.       Allergies  Allergen Reactions  . Ativan [Lorazepam] Swelling and Other (See Comments)   Dysarthria(patient has difficulty speaking and slurred speech); denies swelling, itching, pain, or numbness.  [name] [Ziprasidone Hcl] Itching and Swelling    Tongue swelling  . Keflex [Cephalexin] Swelling and Other (See Comments)    Tongue swelling. Can't talk   . Haldol [Haloperidol Lactate] Swelling    Tongue swelling. 05/31/15 - MD ok with giving as pt has tolerated in the past Pt can take benadryl.  . Other Itching    wool    Consultations:  Nephrology   Procedures/Studies: Dg Finger Thumb Left  Result Date: 11/02/2015 CLINICAL DATA:  Left thumb pain. EXAM: LEFT THUMB 2+V COMPARISON:  None. FINDINGS: There is no evidence of fracture or dislocation. There is no evidence of arthropathy or other focal bone abnormality. Soft tissues demonstrate diffuse edema. IMPRESSION: No acute fracture or dislocation identified about the left thumb. Diffuse soft tissue edema. Electronically Signed   By: Fidela Salisbury M.D.   On: 11/02/2015 08:54       Discharge Exam: Vitals:   11/30/15 1530 11/30/15 1600  BP: (!) 144/98 (!) 155/77  Pulse: 90 90  Resp:    Temp:     Vitals:   11/30/15 1430 11/30/15 1500 11/30/15 1530 11/30/15 1600  BP: (!) 146/93 (!) 145/99 (!) 144/98 (!) 155/77  Pulse: 100 88 90 90  Resp:      Temp:      TempSrc:      SpO2:      Weight:      Height:  General: Pt is alert, awake, not in acute distress Cardiovascular: RRR, S1/S2 +, no rubs, no gallops Respiratory: CTA bilaterally, no wheezing, no rhonchi Abdominal: Soft, NT, ND, bowel sounds + Extremities: no edema, no cyanosis    The results of significant diagnostics from this hospitalization (including imaging, microbiology, ancillary and laboratory) are listed below for reference.     Microbiology: No results found for this or any previous visit (from the past 240 hour(s)).   Labs: BNP (last 3 results) No results for input(s): BNP in the last 8760 hours. Basic Metabolic Panel:  Recent  Labs Lab 11/26/15 1127 11/28/15 1120 11/30/15 1130  NA 135 134* 132*  K 4.5 4.7 4.6  CL 99* 97* 99*  CO2 26 27 24   GLUCOSE 76 103* 75  BUN 51* 48* 58*  CREATININE 11.00* 8.96* 9.12*  CALCIUM 8.5* 9.1 8.7*  PHOS 6.7* 6.0* 5.8*   Liver Function Tests:  Recent Labs Lab 11/26/15 1127 11/28/15 1120 11/30/15 1130  ALBUMIN 3.0* 3.1* 3.0*   No results for input(s): LIPASE, AMYLASE in the last 168 hours. No results for input(s): AMMONIA in the last 168 hours. CBC:  Recent Labs Lab 11/26/15 1127 11/28/15 1120 11/30/15 1216  WBC 8.5 7.0 7.4  HGB 8.3* 8.7* 8.4*  HCT 25.9* 27.4* 26.4*  MCV 86.9 86.4 86.0  PLT 200 199 247   Cardiac Enzymes: No results for input(s): CKTOTAL, CKMB, CKMBINDEX, TROPONINI in the last 168 hours. BNP: Invalid input(s): POCBNP CBG: No results for input(s): GLUCAP in the last 168 hours. D-Dimer No results for input(s): DDIMER in the last 72 hours. Hgb A1c No results for input(s): HGBA1C in the last 72 hours. Lipid Profile No results for input(s): CHOL, HDL, LDLCALC, TRIG, CHOLHDL, LDLDIRECT in the last 72 hours. Thyroid function studies No results for input(s): TSH, T4TOTAL, T3FREE, THYROIDAB in the last 72 hours.  Invalid input(s): FREET3 Anemia work up No results for input(s): VITAMINB12, FOLATE, FERRITIN, TIBC, IRON, RETICCTPCT in the last 72 hours. Urinalysis    Component Value Date/Time   COLORURINE YELLOW 08/11/2015 1235   APPEARANCEUR HAZY (A) 08/11/2015 1235   LABSPEC 1.015 08/11/2015 1235   PHURINE 7.5 08/11/2015 1235   GLUCOSEU 100 (A) 08/11/2015 1235   HGBUR SMALL (A) 08/11/2015 1235   BILIRUBINUR NEGATIVE 08/11/2015 1235   KETONESUR NEGATIVE 08/11/2015 1235   PROTEINUR >300 (A) 08/11/2015 1235   UROBILINOGEN 0.2 01/09/2015 1645   NITRITE NEGATIVE 08/11/2015 1235   LEUKOCYTESUR SMALL (A) 08/11/2015 1235   Sepsis Labs Invalid input(s): PROCALCITONIN,  WBC,  LACTICIDVEN Microbiology No results found for this or any  previous visit (from the past 240 hour(s)).   Time coordinating discharge: Less than 30 minutes  SIGNED:   Newman Pies, MD  [facility] 11/30/2015, 5:14 PM Pager [phone] If 7PM-7AM, please contact night-coverage [email or web address] Password [name]

## 2015-11-30 NOTE — ED Triage Notes (Signed)
PT presents to ED requesting hemodialysis today. PT denies any pain or SOB today and starts last dialysis x2 days ago at [facility].
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
## 2015-12-03 ENCOUNTER — Observation Stay (HOSPITAL_COMMUNITY)
Admission: EM | Admit: 2015-12-03 | Discharge: 2015-12-03 | Disposition: A | Discharge status: Home / Self Care | Payer: Medicare Other | Attending: Internal Medicine | Admitting: Internal Medicine

## 2015-12-03 ENCOUNTER — Encounter (HOSPITAL_COMMUNITY): Payer: Self-pay | Admitting: Emergency Medicine

## 2015-12-03 DIAGNOSIS — I10 Essential (primary) hypertension: Secondary | ICD-10-CM | POA: Diagnosis present

## 2015-12-03 DIAGNOSIS — Z79899 Other long term (current) drug therapy: Secondary | ICD-10-CM | POA: Diagnosis not present

## 2015-12-03 DIAGNOSIS — M3214 Glomerular disease in systemic lupus erythematosus: Secondary | ICD-10-CM | POA: Insufficient documentation

## 2015-12-03 DIAGNOSIS — I132 Hypertensive heart and chronic kidney disease with heart failure and with stage 5 chronic kidney disease, or end stage renal disease: Secondary | ICD-10-CM | POA: Diagnosis not present

## 2015-12-03 DIAGNOSIS — D631 Anemia in chronic kidney disease: Secondary | ICD-10-CM | POA: Diagnosis present

## 2015-12-03 DIAGNOSIS — M329 Systemic lupus erythematosus, unspecified: Secondary | ICD-10-CM | POA: Diagnosis present

## 2015-12-03 DIAGNOSIS — N186 End stage renal disease: Secondary | ICD-10-CM | POA: Diagnosis present

## 2015-12-03 DIAGNOSIS — I5022 Chronic systolic (congestive) heart failure: Secondary | ICD-10-CM | POA: Insufficient documentation

## 2015-12-03 DIAGNOSIS — N19 Unspecified kidney failure: Secondary | ICD-10-CM | POA: Diagnosis not present

## 2015-12-03 DIAGNOSIS — Z992 Dependence on renal dialysis: Secondary | ICD-10-CM | POA: Diagnosis not present

## 2015-12-03 LAB — I-STAT CHEM 8, ED
BUN: 50 mg/dL — AB (ref 6–20)
CHLORIDE: 98 mmol/L — AB (ref 101–111)
Calcium, Ion: 1.15 mmol/L (ref 1.13–1.30)
Creatinine, Ser: 9.3 mg/dL — ABNORMAL HIGH (ref 0.44–1.00)
Glucose, Bld: 84 mg/dL (ref 65–99)
HEMATOCRIT: 31 % — AB (ref 36.0–46.0)
HEMOGLOBIN: 10.5 g/dL — AB (ref 12.0–15.0)
POTASSIUM: 5 mmol/L (ref 3.5–5.1)
SODIUM: 136 mmol/L (ref 135–145)
TCO2: 28 mmol/L (ref 0–100)

## 2015-12-03 LAB — RENAL FUNCTION PANEL
ANION GAP: 10 (ref 5–15)
Albumin: 3.3 g/dL — ABNORMAL LOW (ref 3.5–5.0)
BUN: 59 mg/dL — ABNORMAL HIGH (ref 6–20)
CALCIUM: 9 mg/dL (ref 8.9–10.3)
CO2: 26 mmol/L (ref 22–32)
Chloride: 99 mmol/L — ABNORMAL LOW (ref 101–111)
Creatinine, Ser: 9.62 mg/dL — ABNORMAL HIGH (ref 0.44–1.00)
GFR calc non Af Amer: 5 mL/min — ABNORMAL LOW (ref >60)
GFR, EST AFRICAN AMERICAN: 5 mL/min — AB (ref >60)
Glucose, Bld: 69 mg/dL (ref 65–99)
Phosphorus: 5.8 mg/dL — ABNORMAL HIGH (ref 2.5–4.6)
Potassium: 5.1 mmol/L (ref 3.5–5.1)
SODIUM: 135 mmol/L (ref 135–145)

## 2015-12-03 LAB — CBC
HCT: 26.3 % — ABNORMAL LOW (ref 36.0–46.0)
HEMOGLOBIN: 8.4 g/dL — AB (ref 12.0–15.0)
MCH: 27.5 pg (ref 26.0–34.0)
MCHC: 31.9 g/dL (ref 30.0–36.0)
MCV: 85.9 fL (ref 78.0–100.0)
Platelets: 254 10*3/uL (ref 150–400)
RBC: 3.06 MIL/uL — AB (ref 3.87–5.11)
RDW: 17.3 % — ABNORMAL HIGH (ref 11.5–15.5)
WBC: 7 10*3/uL (ref 4.0–10.5)

## 2015-12-03 MED ORDER — METOPROLOL TARTRATE 50 MG PO TABS: 50.0000 mg | ORAL_TABLET | Freq: Two times a day (BID) | ORAL | 0 refills | Status: AC

## 2015-12-03 MED ORDER — SODIUM CHLORIDE 0.9 % IV SOLN: 100.0000 mL | INTRAVENOUS | Status: DC | PRN

## 2015-12-03 MED ORDER — DIPHENHYDRAMINE HCL 25 MG PO CAPS
25.0000 mg | ORAL_CAPSULE | Freq: Four times a day (QID) | ORAL | Status: DC | PRN
  Administered 2015-12-03: 25 mg via ORAL

## 2015-12-03 MED ORDER — LIDOCAINE HCL (PF) 1 % IJ SOLN
5.0000 mL | INTRAMUSCULAR | Status: DC | PRN
  Administered 2015-12-03: 0.5 mL via INTRADERMAL

## 2015-12-03 MED ORDER — PENTAFLUOROPROP-TETRAFLUOROETH EX AERO: 1.0000 "application " | INHALATION_SPRAY | CUTANEOUS | Status: DC | PRN

## 2015-12-03 MED ORDER — SEVELAMER CARBONATE 800 MG PO TABS
3200.0000 mg | ORAL_TABLET | Freq: Three times a day (TID) | ORAL | Status: DC
  Filled 2015-12-03 (×6): qty 4

## 2015-12-03 MED ORDER — TRAMADOL HCL 50 MG PO TABS: 50.0000 mg | ORAL_TABLET | Freq: Four times a day (QID) | ORAL | Status: DC | PRN

## 2015-12-03 MED ORDER — DIPHENHYDRAMINE HCL 25 MG PO CAPS
ORAL_CAPSULE | ORAL | Status: AC
  Administered 2015-12-03: 25 mg via ORAL
  Filled 2015-12-03: qty 1

## 2015-12-03 MED ORDER — METOPROLOL TARTRATE 50 MG PO TABS
50.0000 mg | ORAL_TABLET | Freq: Two times a day (BID) | ORAL | Status: DC
  Administered 2015-12-03: 50 mg via ORAL
  Filled 2015-12-03: qty 1

## 2015-12-03 MED ORDER — SEVELAMER CARBONATE 800 MG PO TABS
2400.0000 mg | ORAL_TABLET | Freq: Three times a day (TID) | ORAL | Status: DC
  Administered 2015-12-03: 2400 mg via ORAL
  Filled 2015-12-03 (×7): qty 3

## 2015-12-03 MED ORDER — FUROSEMIDE 40 MG PO TABS
120.0000 mg | ORAL_TABLET | Freq: Every day | ORAL | Status: DC
  Administered 2015-12-03: 120 mg via ORAL
  Filled 2015-12-03: qty 3

## 2015-12-03 MED ORDER — TRAMADOL HCL 50 MG PO TABS
ORAL_TABLET | ORAL | Status: AC
  Administered 2015-12-03: 50 mg via ORAL
  Filled 2015-12-03: qty 1

## 2015-12-03 MED ORDER — SEVELAMER CARBONATE 800 MG PO TABS
ORAL_TABLET | ORAL | Status: AC
  Administered 2015-12-03: 2400 mg via ORAL
  Filled 2015-12-03: qty 3

## 2015-12-03 MED ORDER — TRAMADOL HCL 50 MG PO TABS
50.0000 mg | ORAL_TABLET | Freq: Two times a day (BID) | ORAL | Status: DC | PRN
  Administered 2015-12-03: 50 mg via ORAL

## 2015-12-03 MED ORDER — HEPARIN SODIUM (PORCINE) 1000 UNIT/ML DIALYSIS
20.0000 [IU]/kg | INTRAMUSCULAR | Status: DC | PRN
  Administered 2015-12-03: 1500 [IU] via INTRAVENOUS_CENTRAL
  Filled 2015-12-03 (×2): qty 2

## 2015-12-03 MED ORDER — LIDOCAINE-PRILOCAINE 2.5-2.5 % EX CREA: 1.0000 "application " | TOPICAL_CREAM | CUTANEOUS | Status: DC | PRN

## 2015-12-03 MED ORDER — DOXERCALCIFEROL 4 MCG/2ML IV SOLN
5.0000 ug | INTRAVENOUS | Status: DC
  Administered 2015-12-03: 5 ug via INTRAVENOUS
  Filled 2015-12-03 (×2): qty 4

## 2015-12-03 MED ORDER — DOXERCALCIFEROL 4 MCG/2ML IV SOLN
INTRAVENOUS | Status: AC
  Administered 2015-12-03: 5 ug via INTRAVENOUS
  Filled 2015-12-03: qty 4

## 2015-12-03 MED ORDER — LIDOCAINE HCL (PF) 1 % IJ SOLN
INTRAMUSCULAR | Status: AC
  Administered 2015-12-03: 0.5 mL via INTRADERMAL
  Filled 2015-12-03: qty 5

## 2015-12-03 MED ORDER — ACETAMINOPHEN 500 MG PO TABS: 1000.0000 mg | ORAL_TABLET | Freq: Four times a day (QID) | ORAL | Status: DC | PRN

## 2015-12-03 MED ORDER — PREDNISONE 20 MG PO TABS
20.0000 mg | ORAL_TABLET | Freq: Every day | ORAL | Status: DC
Start: 2015-12-04 — End: 2015-12-03

## 2015-12-03 MED ORDER — HEPARIN SODIUM (PORCINE) 1000 UNIT/ML IJ SOLN
INTRAMUSCULAR | Status: AC
  Administered 2015-12-03: 1500 [IU] via INTRAVENOUS_CENTRAL
  Filled 2015-12-03: qty 6

## 2015-12-03 NOTE — ED Notes (Signed)
Dialysis nurse called to bring pt up for dialysis
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
## 2015-12-03 NOTE — ED Notes (Signed)
Pt sitting in ED waiting room. Nurse called floor to let staff now. Pt was never assigned to room .

## 2015-12-03 NOTE — Procedures (Signed)
   HEMODIALYSIS TREATMENT NOTE:  4.5 hour low-heparin dialysis completed via right upper arm AVF (15g ante/retrograde). Goal met: 4 liters removed without interruption in ultrafiltration.  All blood was returned and hemostasis was achieved within 15 min.  Pt was evaluated by Dr. Roderic Palau on dialysis.  Admission orders were written however pt stated she intended to leave hospital from HD unit post dialysis.  Fluid and diet restrictions were reinforced and pt signed AMA waiver.  Metoprolol and Furosemide were given.  She left dialysis unit ambulatory with steady gait.   Rockwell Alexandria, RN, CDN

## 2015-12-03 NOTE — ED Triage Notes (Signed)
Pt requesting dialysis today, last session Friday, no pain at this time.

## 2015-12-03 NOTE — Discharge Summary (Signed)
Physician Discharge Summary  Sara [name] [name] DOB: July 26, 1980 DOA: 12/03/2015  PCP: Pcp Not In System  Admit date: 12/03/2015 Discharge date: 12/03/2015  Admitted From: home Disposition:  home  Recommendations for Outpatient Follow-up:  1. Follow up with PCP in 1-2 weeks  Home Health: Equipment/Devices  Discharge Condition: stable CODE STATUS: full code Diet recommendation: Heart Healthy  Brief/Interim Summary: Sara [name] is a 35 y.o. female with medical history significant of ESRD on HD, lupus on chronic prednisone, HTN, who presents to [facility] today for regular dialysis. She denies any shortness of breath or chest pain, she has not had any worsening of LE edema. Patient is not set up with outpatient dialysis center and comes to [facility] regularly to undergo dialysis. She has no new complaints today and feels well  Patient completed dialysis session without problems. She does not have any complaints at this time. She will be discharged in stable condition.  Discharge Diagnoses:  Active Problems:   Essential hypertension   Anemia in ESRD (end-stage renal disease) ([place])   ESRD on hemodialysis (H[facility])   Lupus (systemic lupus erythematosus) (HCC)   ESRD (end stage renal disease) [facility])    Discharge Instructions  Discharge Instructions    Diet - low sodium heart healthy    Complete by:  As directed   Increase activity slowly    Complete by:  As directed       Medication List    TAKE these medications   acetaminophen 500 MG tablet Commonly known as:  TYLENOL Take 1,000 mg by mouth every 6 (six) hours as needed for moderate pain. Reported on 06/12/2015   furosemide 40 MG tablet Commonly known as:  LASIX Take 3 tablets (120 mg total) by mouth daily.   metoprolol 50 MG tablet Commonly known as:  LOPRESSOR Take 1 tablet (50 mg total) by mouth 2 (two) times daily.   predniSONE 20 MG tablet Commonly known as:  DELTASONE Take 1 tablet (20  mg total) by mouth daily with breakfast.   risperiDONE microspheres 25 MG injection Commonly known as:  RISPERDAL CONSTA Inject 25 mg into the muscle every 14 (fourteen) days.   sevelamer carbonate 800 MG tablet Commonly known as:  RENVELA Take 4 tablets (3,200 mg total) by mouth 3 (three) times daily with meals.   traMADol 50 MG tablet Commonly known as:  ULTRAM Take 1 tablet (50 mg total) by mouth every 6 (six) hours as needed for moderate pain.       Allergies  Allergen Reactions  . Ativan [Lorazepam] Swelling and Other (See Comments)    Dysarthria(patient has difficulty speaking and slurred speech); denies swelling, itching, pain, or numbness.  [name] [Ziprasidone Hcl] Itching and Swelling    Tongue swelling  . Keflex [Cephalexin] Swelling and Other (See Comments)    Tongue swelling. Can't talk   . Haldol [Haloperidol Lactate] Swelling    Tongue swelling. 05/31/15 - MD ok with giving as pt has tolerated in the past Pt can take benadryl.  . Other Itching    wool    Consultations:  nephrology     Subjective: No shortness of breath or chest pain  Discharge Exam: Vitals:   12/03/15 1530 12/03/15 1545  BP: 153/100 (!) 158/103  Pulse: 102 103  Resp:  16  Temp:  98 F (36.7 C)   Vitals:   12/03/15 1430 12/03/15 1500 12/03/15 1530 12/03/15 1545  BP: 137/78 (!) 149/110 153/100 (!) 158/103  Pulse: 101  101 102 103  Resp:    16  Temp:    98 F (36.7 C)  TempSrc:    Oral  SpO2:      Weight:      Height:        General: Pt is alert, awake, not in acute distress Cardiovascular: RRR, S1/S2 +, no rubs, no gallops Respiratory: CTA bilaterally, no wheezing, no rhonchi Abdominal: Soft, NT, ND, bowel sounds + Extremities: no edema, no cyanosis    The results of significant diagnostics from this hospitalization (including imaging, microbiology, ancillary and laboratory) are listed below for reference.     Microbiology: No results found for this or any  previous visit (from the past 240 hour(s)).   Labs: BNP (last 3 results) No results for input(s): BNP in the last 8760 hours. Basic Metabolic Panel:  Recent Labs Lab 11/28/15 1120 11/30/15 1130 12/03/15 0804 12/03/15 1215  NA 134* 132* 136 135  K 4.7 4.6 5.0 5.1  CL 97* 99* 98* 99*  CO2 27 24  --  26  GLUCOSE 103* 75 84 69  BUN 48* 58* 50* 59*  CREATININE 8.96* 9.12* 9.30* 9.62*  CALCIUM 9.1 8.7*  --  9.0  PHOS 6.0* 5.8*  --  5.8*   Liver Function Tests:  Recent Labs Lab 11/28/15 1120 11/30/15 1130 12/03/15 1215  ALBUMIN 3.1* 3.0* 3.3*   No results for input(s): LIPASE, AMYLASE in the last 168 hours. No results for input(s): AMMONIA in the last 168 hours. CBC:  Recent Labs Lab 11/28/15 1120 11/30/15 1216 12/03/15 0804 12/03/15 1215  WBC 7.0 7.4  --  7.0  HGB 8.7* 8.4* 10.5* 8.4*  HCT 27.4* 26.4* 31.0* 26.3*  MCV 86.4 86.0  --  85.9  PLT 199 247  --  254   Cardiac Enzymes: No results for input(s): CKTOTAL, CKMB, CKMBINDEX, TROPONINI in the last 168 hours. BNP: Invalid input(s): POCBNP CBG: No results for input(s): GLUCAP in the last 168 hours. D-Dimer No results for input(s): DDIMER in the last 72 hours. Hgb A1c No results for input(s): HGBA1C in the last 72 hours. Lipid Profile No results for input(s): CHOL, HDL, LDLCALC, TRIG, CHOLHDL, LDLDIRECT in the last 72 hours. Thyroid function studies No results for input(s): TSH, T4TOTAL, T3FREE, THYROIDAB in the last 72 hours.  Invalid input(s): FREET3 Anemia work up No results for input(s): VITAMINB12, FOLATE, FERRITIN, TIBC, IRON, RETICCTPCT in the last 72 hours. Urinalysis    Component Value Date/Time   COLORURINE YELLOW 08/11/2015 1235   APPEARANCEUR HAZY (A) 08/11/2015 1235   LABSPEC 1.015 08/11/2015 1235   PHURINE 7.5 08/11/2015 1235   GLUCOSEU 100 (A) 08/11/2015 1235   HGBUR SMALL (A) 08/11/2015 1235   BILIRUBINUR NEGATIVE 08/11/2015 1235   KETONESUR NEGATIVE 08/11/2015 1235   PROTEINUR  >300 (A) 08/11/2015 1235   UROBILINOGEN 0.2 01/09/2015 1645   NITRITE NEGATIVE 08/11/2015 1235   LEUKOCYTESUR SMALL (A) 08/11/2015 1235   Sepsis Labs Invalid input(s): PROCALCITONIN,  WBC,  LACTICIDVEN Microbiology No results found for this or any previous visit (from the past 240 hour(s)).   Time coordinating discharge: Over 30 minutes  SIGNED:   Kathie Dike, MD  [facility] 12/03/2015, 4:58 PM Pager   If 7PM-7AM, please contact night-coverage [email or web address] Password [name]

## 2015-12-03 NOTE — ED Notes (Signed)
Attempted to call report. unable

## 2015-12-05 ENCOUNTER — Observation Stay (HOSPITAL_COMMUNITY)
Admission: EM | Admit: 2015-12-05 | Discharge: 2015-12-05 | Disposition: A | Discharge status: Home / Self Care | Payer: Medicare Other | Attending: Internal Medicine | Admitting: Internal Medicine

## 2015-12-05 ENCOUNTER — Encounter (HOSPITAL_COMMUNITY): Payer: Self-pay

## 2015-12-05 DIAGNOSIS — D631 Anemia in chronic kidney disease: Secondary | ICD-10-CM | POA: Diagnosis not present

## 2015-12-05 DIAGNOSIS — Z79899 Other long term (current) drug therapy: Secondary | ICD-10-CM | POA: Insufficient documentation

## 2015-12-05 DIAGNOSIS — E8770 Fluid overload, unspecified: Secondary | ICD-10-CM | POA: Diagnosis not present

## 2015-12-05 DIAGNOSIS — I10 Essential (primary) hypertension: Secondary | ICD-10-CM | POA: Diagnosis not present

## 2015-12-05 DIAGNOSIS — Z992 Dependence on renal dialysis: Secondary | ICD-10-CM | POA: Insufficient documentation

## 2015-12-05 DIAGNOSIS — N19 Unspecified kidney failure: Secondary | ICD-10-CM | POA: Diagnosis not present

## 2015-12-05 DIAGNOSIS — I132 Hypertensive heart and chronic kidney disease with heart failure and with stage 5 chronic kidney disease, or end stage renal disease: Principal | ICD-10-CM | POA: Insufficient documentation

## 2015-12-05 DIAGNOSIS — M329 Systemic lupus erythematosus, unspecified: Secondary | ICD-10-CM | POA: Diagnosis present

## 2015-12-05 DIAGNOSIS — I5022 Chronic systolic (congestive) heart failure: Secondary | ICD-10-CM | POA: Diagnosis not present

## 2015-12-05 DIAGNOSIS — F17210 Nicotine dependence, cigarettes, uncomplicated: Secondary | ICD-10-CM | POA: Insufficient documentation

## 2015-12-05 DIAGNOSIS — I120 Hypertensive chronic kidney disease with stage 5 chronic kidney disease or end stage renal disease: Secondary | ICD-10-CM | POA: Diagnosis not present

## 2015-12-05 DIAGNOSIS — N186 End stage renal disease: Secondary | ICD-10-CM | POA: Diagnosis present

## 2015-12-05 LAB — I-STAT CHEM 8, ED
BUN: 49 mg/dL — ABNORMAL HIGH (ref 6–20)
CALCIUM ION: 1.21 mmol/L (ref 1.15–1.40)
CHLORIDE: 97 mmol/L — AB (ref 101–111)
Creatinine, Ser: 7.9 mg/dL — ABNORMAL HIGH (ref 0.44–1.00)
GLUCOSE: 83 mg/dL (ref 65–99)
HCT: 29 % — ABNORMAL LOW (ref 36.0–46.0)
Hemoglobin: 9.9 g/dL — ABNORMAL LOW (ref 12.0–15.0)
Potassium: 4.8 mmol/L (ref 3.5–5.1)
Sodium: 134 mmol/L — ABNORMAL LOW (ref 135–145)
TCO2: 27 mmol/L (ref 0–100)

## 2015-12-05 LAB — RENAL FUNCTION PANEL
Albumin: 3.2 g/dL — ABNORMAL LOW (ref 3.5–5.0)
Anion gap: 11 (ref 5–15)
BUN: 57 mg/dL — ABNORMAL HIGH (ref 6–20)
CO2: 26 mmol/L (ref 22–32)
Calcium: 9.5 mg/dL (ref 8.9–10.3)
Chloride: 97 mmol/L — ABNORMAL LOW (ref 101–111)
Creatinine, Ser: 8.17 mg/dL — ABNORMAL HIGH (ref 0.44–1.00)
GFR calc Af Amer: 7 mL/min — ABNORMAL LOW (ref >60)
GFR calc non Af Amer: 6 mL/min — ABNORMAL LOW (ref >60)
Glucose, Bld: 61 mg/dL — ABNORMAL LOW (ref 65–99)
Phosphorus: 6.3 mg/dL — ABNORMAL HIGH (ref 2.5–4.6)
Potassium: 4.9 mmol/L (ref 3.5–5.1)
Sodium: 134 mmol/L — ABNORMAL LOW (ref 135–145)

## 2015-12-05 MED ORDER — LIDOCAINE HCL (PF) 1 % IJ SOLN
5.0000 mL | INTRAMUSCULAR | Status: DC | PRN
  Administered 2015-12-05: 0.5 mL via INTRADERMAL

## 2015-12-05 MED ORDER — EPOETIN ALFA 4000 UNIT/ML IJ SOLN: 4000.0000 [IU] | INTRAMUSCULAR | Status: DC

## 2015-12-05 MED ORDER — HEPARIN SODIUM (PORCINE) 1000 UNIT/ML IJ SOLN
INTRAMUSCULAR | Status: AC
  Administered 2015-12-05: 1500 [IU] via INTRAVENOUS_CENTRAL
  Filled 2015-12-05: qty 6

## 2015-12-05 MED ORDER — RISPERIDONE MICROSPHERES 25 MG IM SUSR: 25.0000 mg | INTRAMUSCULAR | Status: DC

## 2015-12-05 MED ORDER — SODIUM CHLORIDE 0.9 % IV SOLN: 100.0000 mL | INTRAVENOUS | Status: DC | PRN

## 2015-12-05 MED ORDER — EPOETIN ALFA 4000 UNIT/ML IJ SOLN
4000.0000 [IU] | INTRAMUSCULAR | Status: DC
  Administered 2015-12-05: 4000 [IU] via INTRAVENOUS

## 2015-12-05 MED ORDER — LIDOCAINE-PRILOCAINE 2.5-2.5 % EX CREA: 1.0000 "application " | TOPICAL_CREAM | CUTANEOUS | Status: DC | PRN

## 2015-12-05 MED ORDER — LIDOCAINE HCL (PF) 1 % IJ SOLN
INTRAMUSCULAR | Status: AC
  Administered 2015-12-05: 0.5 mL via INTRADERMAL
  Filled 2015-12-05: qty 5

## 2015-12-05 MED ORDER — DIPHENHYDRAMINE HCL 50 MG/ML IJ SOLN: 25.0000 mg | Freq: Four times a day (QID) | INTRAMUSCULAR | Status: DC | PRN

## 2015-12-05 MED ORDER — PREDNISONE 20 MG PO TABS: 20.0000 mg | ORAL_TABLET | Freq: Every day | ORAL | Status: DC

## 2015-12-05 MED ORDER — METOPROLOL TARTRATE 50 MG PO TABS: 50.0000 mg | ORAL_TABLET | Freq: Two times a day (BID) | ORAL | Status: DC

## 2015-12-05 MED ORDER — SEVELAMER CARBONATE 800 MG PO TABS
3200.0000 mg | ORAL_TABLET | Freq: Three times a day (TID) | ORAL | Status: DC
Start: 2015-12-05 — End: 2015-12-05
  Administered 2015-12-05: 3200 mg via ORAL
  Filled 2015-12-05: qty 4

## 2015-12-05 MED ORDER — ACETAMINOPHEN 500 MG PO TABS: 1000.0000 mg | ORAL_TABLET | Freq: Four times a day (QID) | ORAL | Status: DC | PRN

## 2015-12-05 MED ORDER — FUROSEMIDE 80 MG PO TABS: 120.0000 mg | ORAL_TABLET | Freq: Every day | ORAL | Status: DC

## 2015-12-05 MED ORDER — PENTAFLUOROPROP-TETRAFLUOROETH EX AERO: 1.0000 "application " | INHALATION_SPRAY | CUTANEOUS | Status: DC | PRN

## 2015-12-05 MED ORDER — HEPARIN SODIUM (PORCINE) 1000 UNIT/ML DIALYSIS
20.0000 [IU]/kg | INTRAMUSCULAR | Status: DC | PRN
  Administered 2015-12-05: 1500 [IU] via INTRAVENOUS_CENTRAL

## 2015-12-05 MED ORDER — EPOETIN ALFA 4000 UNIT/ML IJ SOLN
INTRAMUSCULAR | Status: AC
  Administered 2015-12-05: 4000 [IU] via INTRAVENOUS
  Filled 2015-12-05: qty 1

## 2015-12-05 MED ORDER — TRAMADOL HCL 50 MG PO TABS
50.0000 mg | ORAL_TABLET | Freq: Four times a day (QID) | ORAL | Status: DC | PRN
  Administered 2015-12-05: 50 mg via ORAL

## 2015-12-05 MED ORDER — DIPHENHYDRAMINE HCL 25 MG PO CAPS
ORAL_CAPSULE | ORAL | Status: AC
  Administered 2015-12-05: 25 mg
  Filled 2015-12-05: qty 1

## 2015-12-05 MED ORDER — TRAMADOL HCL 50 MG PO TABS
ORAL_TABLET | ORAL | Status: AC
Start: 2015-12-05 — End: 2015-12-05
  Administered 2015-12-05: 50 mg via ORAL
  Filled 2015-12-05: qty 1

## 2015-12-05 MED ORDER — SEVELAMER CARBONATE 800 MG PO TABS
ORAL_TABLET | ORAL | Status: AC
  Administered 2015-12-05: 3200 mg via ORAL
  Filled 2015-12-05: qty 4
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
## 2015-12-05 NOTE — Procedures (Signed)
   HEMODIALYSIS TREATMENT NOTE:  4 hour low-heparin dialysis completed via right upper arm AVF (15g ante/retrograde). Goal met: 2.6 liters removed without interruption in ultrafiltration.  All blood was returned and hemostasis was achieved within 15 minutes. Report called to Theola Sequin, RN.  [place], RN, CDN

## 2015-12-05 NOTE — ED Triage Notes (Signed)
Pt says is here for dialysis.  Last treatment was Monday.  Pt also reports  A knot on left knee for 2 days.  Denies injury.

## 2015-12-05 NOTE — Progress Notes (Signed)
Pt left AMA after dialysis. Pt signed AMA form and understand the risk of leaving. Oswald Hillock, RN  No IV access. VSS. Oswald Hillock, RN

## 2015-12-05 NOTE — Consult Note (Signed)
Sara [name] MRN: [id number] DOB/AGE: September 07, 1980 35 y.o. Primary Care Physician:Pcp Not In System Admit date: 12/05/2015 Chief Complaint:  Chief Complaint  Patient presents with  . Other    wants dialysis   HPI: Pt is 35 year old female with past medical hx of ESRD who was admitted with c/o dyspnea," I need dialysis "  HPI dates back to few days ago started feeling short of breath,and so pt came to ER asking for her dialysis. Pt says " I need my dialysis .I am full of fluid. I had my dialysis this last Monday on  12/03/15 ". No c/o fever/cough/chills NO c/o nausea/vomiting. NO c/o abdominal pain. No c/o hematuria     Past Medical History:  Diagnosis Date  . Acute myopericarditis    hx/notes 10/09/2014  . Bipolar disorder ([place])    [name] 10/09/2014  . CHF (congestive heart failure) ([place])    systolic/notes 123XX123  . Chronic anemia    [name] 10/09/2014  . ESRD (end stage renal disease) on dialysis [facility])    "[name]; [facility]" (10/09/2014)  . History of blood transfusion    "this is probably my 3rd" (10/09/2014)  . Hypertension   . Low back pain   . Lupus ([place])    lupus w nephritis  . 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 Non-compliant patient   . Positive ANA (antinuclear antibody) 08/16/2012  . 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 Pregnancy   . Psychosis   . Schizoaffective disorder, bipolar type ([place]) 11/20/2014  . Schizophrenia ([place])    [name] 10/09/2014  . Tobacco abuse 02/20/2014        Family History  Problem Relation Age of Onset  . Drug abuse Father   . Kidney disease Father     Social History:  reports that she has been smoking Cigarettes.  She has a 2.00 pack-year smoking history. She has never used smokeless tobacco. She reports that she does not drink alcohol or use drugs.   Allergies:  Allergies  Allergen Reactions  . Ativan [Lorazepam] Swelling and Other (See Comments)    Dysarthria(patient has difficulty speaking and slurred speech); denies swelling,  itching, pain, or numbness.  [name] [Ziprasidone Hcl] Itching and Swelling    Tongue swelling  . Keflex [Cephalexin] Swelling and Other (See Comments)    Tongue swelling. Can't talk   . Haldol [Haloperidol Lactate] Swelling    Tongue swelling. 05/31/15 - MD ok with giving as pt has tolerated in the past Pt can take benadryl.  . Other Itching    wool     (Not in a hospital admission)     [id number] from the symptoms mentioned above,there are no other symptoms referable to all systems reviewed.      Physical Exam: Vital signs in last 24 hours: Temp:  [97.9 F (36.6 C)] 97.9 F (36.6 C) (08/30 0802) Pulse Rate:  [98] 98 (08/30 0802) Resp:  [20] 20 (08/30 0802) BP: (144)/(103) 144/103 (08/30 0802) SpO2:  [100 %] 100 % (08/30 0802) Weight:  [163 lb (73.9 kg)] 163 lb (73.9 kg) (08/30 0801) Weight change:     Intake/Output from previous day: No intake/output data recorded. No intake/output data recorded.   Physical Exam: General- pt is awake,alert, follows coomands Resp- No acute REsp distress,  decreased bs at bases. CVS- S1S2 regular in rate and rhythm, NO rubs  GIT- BS+, soft, NT, ND EXT- 2+ LE Edema, NO Cyanosis CNS- CN 2-12 grossly intact. Moving all 4 extremities Access- AVF+, aneurysm  present    Lab Results:  CBC    Component Value Date/Time   WBC 7.0 12/03/2015 1215   RBC 3.06 (L) 12/03/2015 1215   HGB 9.9 (L) 12/05/2015 0821   HCT 29.0 (L) 12/05/2015 0821   PLT 254 12/03/2015 1215   MCV 85.9 12/03/2015 1215   MCH 27.5 12/03/2015 1215   MCHC 31.9 12/03/2015 1215   RDW 17.3 (H) 12/03/2015 1215   LYMPHSABS 1.0 09/21/2015 0841   MONOABS 0.7 09/21/2015 0841   EOSABS 0.1 09/21/2015 0841   BASOSABS 0.0 09/21/2015 0841      BMET  Recent Labs  12/03/15 1215 12/05/15 0821  NA 135 134*  K 5.1 4.8  CL 99* 97*  CO2 26  --   GLUCOSE 69 83  BUN 59* 49*  CREATININE 9.62* 7.90*  CALCIUM 9.0  --     MICRO No results found for this or any  previous visit (from the past 240 hour(s)).    Lab Results  Component Value Date   PTH 1,100 (H) 11/19/2015   PTH Comment 11/19/2015   CALCIUM 9.0 12/03/2015   CAION 1.21 12/05/2015   PHOS 5.8 (H) 12/03/2015      Impression: 1)Renal  ESRD on HD                Pt is not on regular  Schedule sec to her complaince/adherence issues                Will dialyze pt today  2)HTN Bp is not at goal  3)Anemia In ESRD the goal for HGb is 9--11. 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   Not at goal sec to non adherence to hd tx Phosphorus not at goal.  on binders  5)Psych .  Hx of   psycosis Schizophrenia Primary MD following  6)Electrolytes  Normokalemic  Hyponatremic    Sec to ESRD  7)Acid base Co2  at goal    Plan:  Will dialyze today Will use 2 k bath Will keep on epo Will try to take 2.5-3 liters off if possible   BHUTANI,MANPREET S 12/05/2015, 10:16 AM

## 2015-12-06 LAB — HEPATITIS B SURFACE ANTIGEN: Hepatitis B Surface Ag: NEGATIVE

## 2015-12-07 NOTE — Discharge Summary (Signed)
Physician Discharge Summary  Sara [name] [name] DOB: 1980-05-14 DOA: 12/05/2015  PCP: Pcp Not In System  Admit date: 12/05/2015 Discharge date: 12/07/2015  Admitted From: Home Disposition: Home.  Recommendations for Outpatient Follow-up:  1. Follow up with PCP next week.  2. Return to have dialysis as recommended.   Home Health: No Equipment/Devices: None.  Discharge Condition: stable.  CODE STATUS: FULL CODE.  Diet recommendation: Renal.   Brief/Interim Summary: Patient was admitted for routine dialysis.  Per my H and P:  "Sara [name] a 35 y.o.femalewith medical history significant of end-stage renal disease on Monday Wednesday Friday hemodialysis, schizoaffective disorder, systemic lupus, recent C. difficile colitis for which patient is taking by mouth vancomycin presents to the emergency department stating she is due for dialysis today. Denies shortness of breath or chest pains.  After dialysis, she left AMA.  This is not a rare occurrence.  Attempt to have a process for her dialysis done thru administration has thus far unsuccessful.   Discharge Diagnoses:  Active Problems:   Uremia syndrome   ESRD needing dialysis ([place])   Lupus (systemic lupus erythematosus) ([place])   Renal failure    Discharge Instructions     Medication List    ASK your doctor about these medications   acetaminophen 500 MG tablet Commonly known as:  TYLENOL Take 1,000 mg by mouth every 6 (six) hours as needed for moderate pain. Reported on 06/12/2015   furosemide 40 MG tablet Commonly known as:  LASIX Take 3 tablets (120 mg total) by mouth daily.   metoprolol 50 MG tablet Commonly known as:  LOPRESSOR Take 1 tablet (50 mg total) by mouth 2 (two) times daily.   predniSONE 20 MG tablet Commonly known as:  DELTASONE Take 1 tablet (20 mg total) by mouth daily with breakfast.   risperiDONE microspheres 25 MG injection Commonly known as:  RISPERDAL CONSTA Inject 25 mg into  the muscle every 14 (fourteen) days.   sevelamer carbonate 800 MG tablet Commonly known as:  RENVELA Take 4 tablets (3,200 mg total) by mouth 3 (three) times daily with meals.   traMADol 50 MG tablet Commonly known as:  ULTRAM Take 1 tablet (50 mg total) by mouth every 6 (six) hours as needed for moderate pain.       Allergies  Allergen Reactions  . Ativan [Lorazepam] Swelling and Other (See Comments)    Dysarthria(patient has difficulty speaking and slurred speech); denies swelling, itching, pain, or numbness.  Sara [name] [Ziprasidone Hcl] Itching and Swelling    Tongue swelling  . Keflex [Cephalexin] Swelling and Other (See Comments)    Tongue swelling. Can't talk   . Haldol [Haloperidol Lactate] Swelling    Tongue swelling. 05/31/15 - MD ok with giving as pt has tolerated in the past Pt can take benadryl.  . Other Itching    wool   Discharge Exam: Vitals:   12/05/15 1720 12/05/15 1740  BP: (!) 155/88 (!) 158/86  Pulse: (!) 103 (!) 104  Resp:  16  Temp:  98 F (36.7 C)   Vitals:   12/05/15 1630 12/05/15 1700 12/05/15 1720 12/05/15 1740  BP: (!) 159/96 (!) 161/89 (!) 155/88 (!) 158/86  Pulse: (!) 101 100 (!) 103 (!) 104  Resp:    16  Temp:    98 F (36.7 C)  TempSrc:    Oral  SpO2:      Weight:      Height:        General:  Pt is alert, awake, not in acute distress Cardiovascular: RRR, S1/S2 +, no rubs, no gallops Respiratory: CTA bilaterally, no wheezing, no rhonchi Abdominal: Soft, NT, ND, bowel sounds + Extremities: no edema, no cyanosis    The results of significant diagnostics from this hospitalization (including imaging, microbiology, ancillary and laboratory) are listed below for reference.     Basic Metabolic Panel:  Recent Labs Lab 12/03/15 0804 12/03/15 1215 12/05/15 0821 12/05/15 1533  NA 136 135 134* 134*  K 5.0 5.1 4.8 4.9  CL 98* 99* 97* 97*  CO2  --  26  --  26  GLUCOSE 84 69 83 61*  BUN 50* 59* 49* 57*  CREATININE 9.30* 9.62*  7.90* 8.17*  CALCIUM  --  9.0  --  9.5  PHOS  --  5.8*  --  6.3*   Liver Function Tests:  Recent Labs Lab 12/03/15 1215 12/05/15 1533  ALBUMIN 3.3* 3.2*   CBC:  Recent Labs Lab 12/03/15 0804 12/03/15 1215 12/05/15 0821  WBC  --  7.0  --   HGB 10.5* 8.4* 9.9*  HCT 31.0* 26.3* 29.0*  MCV  --  85.9  --   PLT  --  254  --    Urinalysis    Component Value Date/Time   COLORURINE YELLOW 08/11/2015 1235   APPEARANCEUR HAZY (A) 08/11/2015 1235   LABSPEC 1.015 08/11/2015 1235   PHURINE 7.5 08/11/2015 1235   GLUCOSEU 100 (A) 08/11/2015 1235   HGBUR SMALL (A) 08/11/2015 1235   BILIRUBINUR NEGATIVE 08/11/2015 1235   KETONESUR NEGATIVE 08/11/2015 1235   PROTEINUR >300 (A) 08/11/2015 1235   UROBILINOGEN 0.2 01/09/2015 1645   NITRITE NEGATIVE 08/11/2015 1235   LEUKOCYTESUR SMALL (A) 08/11/2015 1235    Time coordinating discharge: Over 30 minutes  SIGNED:  Orvan Falconer, MD FACP [facility] 12/07/2015, 12:52 PM   If 7PM-7AM, please contact night-coverage [email or web address] Password [name]

## 2015-12-10 ENCOUNTER — Observation Stay (HOSPITAL_COMMUNITY)
Admission: EM | Admit: 2015-12-10 | Discharge: 2015-12-10 | Disposition: A | Discharge status: Home / Self Care | Payer: Medicare Other | Attending: Internal Medicine | Admitting: Internal Medicine

## 2015-12-10 ENCOUNTER — Encounter (HOSPITAL_COMMUNITY): Payer: Self-pay | Admitting: Emergency Medicine

## 2015-12-10 DIAGNOSIS — I10 Essential (primary) hypertension: Secondary | ICD-10-CM | POA: Diagnosis not present

## 2015-12-10 DIAGNOSIS — M329 Systemic lupus erythematosus, unspecified: Secondary | ICD-10-CM | POA: Diagnosis not present

## 2015-12-10 DIAGNOSIS — D631 Anemia in chronic kidney disease: Secondary | ICD-10-CM | POA: Diagnosis not present

## 2015-12-10 DIAGNOSIS — Z9119 Patient's noncompliance with other medical treatment and regimen: Secondary | ICD-10-CM

## 2015-12-10 DIAGNOSIS — N186 End stage renal disease: Secondary | ICD-10-CM | POA: Diagnosis not present

## 2015-12-10 DIAGNOSIS — Z91199 Patient's noncompliance with other medical treatment and regimen due to unspecified reason: Secondary | ICD-10-CM

## 2015-12-10 DIAGNOSIS — I509 Heart failure, unspecified: Secondary | ICD-10-CM | POA: Diagnosis not present

## 2015-12-10 DIAGNOSIS — F250 Schizoaffective disorder, bipolar type: Secondary | ICD-10-CM | POA: Diagnosis present

## 2015-12-10 DIAGNOSIS — Z992 Dependence on renal dialysis: Secondary | ICD-10-CM | POA: Diagnosis not present

## 2015-12-10 DIAGNOSIS — I132 Hypertensive heart and chronic kidney disease with heart failure and with stage 5 chronic kidney disease, or end stage renal disease: Principal | ICD-10-CM | POA: Insufficient documentation

## 2015-12-10 DIAGNOSIS — F17210 Nicotine dependence, cigarettes, uncomplicated: Secondary | ICD-10-CM | POA: Insufficient documentation

## 2015-12-10 DIAGNOSIS — E8770 Fluid overload, unspecified: Secondary | ICD-10-CM | POA: Diagnosis not present

## 2015-12-10 DIAGNOSIS — Z79899 Other long term (current) drug therapy: Secondary | ICD-10-CM | POA: Insufficient documentation

## 2015-12-10 DIAGNOSIS — I120 Hypertensive chronic kidney disease with stage 5 chronic kidney disease or end stage renal disease: Secondary | ICD-10-CM | POA: Diagnosis not present

## 2015-12-10 LAB — CREATININE, SERUM
Creatinine, Ser: 12.59 mg/dL — ABNORMAL HIGH (ref 0.44–1.00)
GFR calc Af Amer: 4 mL/min — ABNORMAL LOW (ref >60)
GFR calc non Af Amer: 3 mL/min — ABNORMAL LOW (ref >60)

## 2015-12-10 LAB — CBC
HEMATOCRIT: 25.9 % — AB (ref 36.0–46.0)
HEMOGLOBIN: 8.5 g/dL — AB (ref 12.0–15.0)
MCH: 27.6 pg (ref 26.0–34.0)
MCHC: 32.8 g/dL (ref 30.0–36.0)
MCV: 84.1 fL (ref 78.0–100.0)
Platelets: 185 10*3/uL (ref 150–400)
RBC: 3.08 MIL/uL — AB (ref 3.87–5.11)
RDW: 17.2 % — AB (ref 11.5–15.5)
WBC: 6.6 10*3/uL (ref 4.0–10.5)

## 2015-12-10 MED ORDER — SEVELAMER CARBONATE 800 MG PO TABS
ORAL_TABLET | ORAL | Status: AC
  Administered 2015-12-10: 3200 mg via ORAL
  Filled 2015-12-10: qty 4

## 2015-12-10 MED ORDER — LIDOCAINE HCL (PF) 1 % IJ SOLN
INTRAMUSCULAR | Status: AC
  Administered 2015-12-10: 0.5 mL via INTRADERMAL
  Filled 2015-12-10: qty 5

## 2015-12-10 MED ORDER — HEPARIN SODIUM (PORCINE) 1000 UNIT/ML DIALYSIS
20.0000 [IU]/kg | INTRAMUSCULAR | Status: DC | PRN
  Administered 2015-12-10: 1500 [IU] via INTRAVENOUS_CENTRAL

## 2015-12-10 MED ORDER — SODIUM CHLORIDE 0.9 % IV SOLN: 100.0000 mL | INTRAVENOUS | Status: DC | PRN

## 2015-12-10 MED ORDER — FUROSEMIDE 40 MG PO TABS
120.0000 mg | ORAL_TABLET | Freq: Every day | ORAL | Status: DC
  Administered 2015-12-10: 120 mg via ORAL
  Filled 2015-12-10: qty 1

## 2015-12-10 MED ORDER — METOPROLOL TARTRATE 50 MG PO TABS
50.0000 mg | ORAL_TABLET | Freq: Two times a day (BID) | ORAL | Status: DC
  Administered 2015-12-10: 50 mg via ORAL
  Filled 2015-12-10: qty 1

## 2015-12-10 MED ORDER — EPOETIN ALFA 4000 UNIT/ML IJ SOLN
INTRAMUSCULAR | Status: AC
  Administered 2015-12-10: 4000 [IU] via INTRAVENOUS
  Filled 2015-12-10: qty 1

## 2015-12-10 MED ORDER — SEVELAMER CARBONATE 800 MG PO TABS
3200.0000 mg | ORAL_TABLET | Freq: Three times a day (TID) | ORAL | Status: DC
Start: 2015-12-10 — End: 2015-12-10
  Administered 2015-12-10: 3200 mg via ORAL
  Filled 2015-12-10 (×7): qty 4

## 2015-12-10 MED ORDER — ACETAMINOPHEN 650 MG RE SUPP: 650.0000 mg | Freq: Four times a day (QID) | RECTAL | Status: DC | PRN

## 2015-12-10 MED ORDER — TRAMADOL HCL 50 MG PO TABS
ORAL_TABLET | ORAL | Status: AC
  Administered 2015-12-10: 50 mg via ORAL
  Filled 2015-12-10: qty 1

## 2015-12-10 MED ORDER — DIPHENHYDRAMINE HCL 25 MG PO CAPS
ORAL_CAPSULE | ORAL | Status: AC
  Administered 2015-12-10: 25 mg via ORAL
  Filled 2015-12-10: qty 1

## 2015-12-10 MED ORDER — TRAMADOL HCL 50 MG PO TABS
50.0000 mg | ORAL_TABLET | Freq: Four times a day (QID) | ORAL | Status: DC | PRN
  Administered 2015-12-10: 50 mg via ORAL

## 2015-12-10 MED ORDER — HEPARIN SODIUM (PORCINE) 5000 UNIT/ML IJ SOLN: 5000.0000 [IU] | Freq: Three times a day (TID) | INTRAMUSCULAR | Status: DC

## 2015-12-10 MED ORDER — LIDOCAINE HCL (PF) 1 % IJ SOLN
5.0000 mL | INTRAMUSCULAR | Status: DC | PRN
  Administered 2015-12-10: 0.5 mL via INTRADERMAL

## 2015-12-10 MED ORDER — PREDNISONE 20 MG PO TABS: 20.0000 mg | ORAL_TABLET | Freq: Every day | ORAL | Status: DC

## 2015-12-10 MED ORDER — EPOETIN ALFA 4000 UNIT/ML IJ SOLN
4000.0000 [IU] | INTRAMUSCULAR | Status: DC
  Administered 2015-12-10: 4000 [IU] via INTRAVENOUS

## 2015-12-10 MED ORDER — PENTAFLUOROPROP-TETRAFLUOROETH EX AERO: 1.0000 "application " | INHALATION_SPRAY | CUTANEOUS | Status: DC | PRN

## 2015-12-10 MED ORDER — ACETAMINOPHEN 325 MG PO TABS: 650.0000 mg | ORAL_TABLET | Freq: Four times a day (QID) | ORAL | Status: DC | PRN

## 2015-12-10 MED ORDER — DIPHENHYDRAMINE HCL 25 MG PO CAPS
25.0000 mg | ORAL_CAPSULE | ORAL | Status: DC | PRN
  Administered 2015-12-10: 25 mg via ORAL

## 2015-12-10 MED ORDER — RISPERIDONE MICROSPHERES 25 MG IM SUSR: 25.0000 mg | INTRAMUSCULAR | Status: DC

## 2015-12-10 MED ORDER — HEPARIN SODIUM (PORCINE) 1000 UNIT/ML IJ SOLN
INTRAMUSCULAR | Status: AC
  Administered 2015-12-10: 1500 [IU] via INTRAVENOUS_CENTRAL
  Filled 2015-12-10: qty 6

## 2015-12-10 MED ORDER — EPOETIN ALFA 4000 UNIT/ML IJ SOLN: 4000.0000 [IU] | INTRAMUSCULAR | Status: DC

## 2015-12-10 NOTE — Progress Notes (Signed)
Dr. Wyline Copas notified of patient's arrival on unit.  Dialysis nurse called and said Dr. Theador Hawthorne is on his way to see patient.
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
## 2015-12-10 NOTE — Progress Notes (Addendum)
Patient returned to room after dialysis and wanted to leave AMA.  Lasix and Metoprolol given to patient per orders - patient aware and verbalized understanding.  Patient signed AMA papers.  No IV access.  Patient stable.  RN notified Dr. Wyline Copas via text page.

## 2015-12-10 NOTE — Consult Note (Signed)
Sara [name] MRN: [id number] DOB/AGE: 07-31-1980 72 y.o. Primary Care Physician:Pcp Not In System Admit date: 12/10/2015 Chief Complaint:  Chief Complaint  Patient presents with  . Other    Needs Dialysis    HPI: Pt is 35 year old female with past medical hx of ESRD who was admitted with c/o dyspnea," I need dialysis "  HPI dates back to few days ago started feeling short of breath,and so pt came to ER asking for her dialysis. Pt says " I need my dialysis . I had my dialysis this last Wednesday on  12/05/15 ". No c/o fever/cough/chills NO c/o nausea/vomiting. NO c/o abdominal pain. No c/o hematuria      Past Medical History:  Diagnosis Date  . Acute myopericarditis    hx/notes 10/09/2014  . Bipolar disorder ([place])    [name] 10/09/2014  . CHF (congestive heart failure) ([place])    systolic/notes 123XX123  . Chronic anemia    [name] 10/09/2014  . ESRD (end stage renal disease) on dialysis [facility])    "[name]; [facility]" (10/09/2014)  . History of blood transfusion    "this is probably my 3rd" (10/09/2014)  . Hypertension   . Low back pain   . Lupus ([place])    lupus w nephritis  . 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 Non-compliant patient   . Positive ANA (antinuclear antibody) 08/16/2012  . 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 Pregnancy   . Psychosis   . Schizoaffective disorder, bipolar type ([place]) 11/20/2014  . Schizophrenia ([place])    [name] 10/09/2014  . Tobacco abuse 02/20/2014        Family History  Problem Relation Age of Onset  . Drug abuse Father   . Kidney disease Father     Social History:  reports that she has been smoking Cigarettes.  She has a 2.00 pack-year smoking history. She has never used smokeless tobacco. She reports that she does not drink alcohol or use drugs.   Allergies:  Allergies  Allergen Reactions  . Ativan [Lorazepam] Swelling and Other (See Comments)    Dysarthria(patient has difficulty speaking and slurred speech); denies swelling, itching, pain, or  numbness.  [name] [Ziprasidone Hcl] Itching and Swelling    Tongue swelling  . Keflex [Cephalexin] Swelling and Other (See Comments)    Tongue swelling. Can't talk   . Haldol [Haloperidol Lactate] Swelling    Tongue swelling. 05/31/15 - MD ok with giving as pt has tolerated in the past Pt can take benadryl.  . Other Itching    wool     (Not in a hospital admission)     [id number] from the symptoms mentioned above,there are no other symptoms referable to all systems reviewed.      Physical Exam: Vital signs in last 24 hours: Temp:  [97.6 F (36.4 C)] 97.6 F (36.4 C) (09/04 0710) Pulse Rate:  [106] 106 (09/04 0710) BP: (167)/(116) 167/116 (09/04 0710) SpO2:  [100 %] 100 % (09/04 0710) Weight:  [160 lb (72.6 kg)] 160 lb (72.6 kg) (09/04 0711) Weight change:     Intake/Output from previous day: No intake/output data recorded. No intake/output data recorded.   Physical Exam: General- pt is awake,alert, follows coomands Resp- No acute REsp distress,  decreased bs at bases. CVS- S1S2 regular in rate and rhythm, NO rubs  GIT- BS+, soft, NT, ND EXT- 1+ LE Edema, NO Cyanosis CNS- CN 2-12 grossly intact. Moving all 4 extremities Access- AVF+    Lab Results:  CBC  Component Value Date/Time   WBC 7.0 12/03/2015 1215   RBC 3.06 (L) 12/03/2015 1215   HGB 9.9 (L) 12/05/2015 0821   HCT 29.0 (L) 12/05/2015 0821   PLT 254 12/03/2015 1215   MCV 85.9 12/03/2015 1215   MCH 27.5 12/03/2015 1215   MCHC 31.9 12/03/2015 1215   RDW 17.3 (H) 12/03/2015 1215   LYMPHSABS 1.0 09/21/2015 0841   MONOABS 0.7 09/21/2015 0841   EOSABS 0.1 09/21/2015 0841   BASOSABS 0.0 09/21/2015 0841      BMET Na 134 K 4.9 Co2 26    Lab Results  Component Value Date   PTH 1,100 (H) 11/19/2015   PTH Comment 11/19/2015   CALCIUM 9.5 12/05/2015   CAION 1.21 12/05/2015   PHOS 6.3 (H) 12/05/2015      Impression: 1)Renal  ESRD on HD                Pt is not on regular  Schedule  sec to her complaince/adherence issues                Will dialyze pt today  2)HTN Bp is not at goal  3)Anemia In ESRD the goal for HGb is 9--11. 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   Not at goal sec to non adherence to hd tx Phosphorus not at goal.  on binders  5)Psych .  Hx of   psycosis Schizophrenia Primary MD following  6)Electrolytes  Normokalemic but K on higher side  Hyponatremic    Sec to ESRD  7)Acid base Co2  at goal    Plan:  Will dialyze today Will use 2 k bath Will keep on epo Will try to take 2.5-3 liters off if possible   Sabian Kuba S 12/10/2015, 8:21 AM
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
## 2015-12-10 NOTE — Discharge Summary (Signed)
Physician Discharge Summary  Sara [name] [name] DOB: 25-Aug-1980 DOA: 12/10/2015  PCP: Pcp Not In System  Admit date: 12/10/2015 Discharge date: 12/10/2015  Patient left AGAINST MEDICAL ADVICE  Brief/Interim Summary: 35 y.o. female with medical history significant of ESRD, schizoaffective d/o, lupus who presents requesting HD. Denies sob or chest pains. Of note, pt recently completed course of PO vanc for presumed cdiff colitis. In the ED, K noted to be 4.9. Cr 8.17. Nephrology consulted. Hospitalist consulted for consideration for admission  Patient underwent dialysis. Noted to have elevated BP post-dialysis. Patient elected to leave against medical advise. Pt was given her home BP meds prior to her leaving.  Discharge Diagnoses:  Principal Problem:   ESRD (end stage renal disease) on dialysis [facility]) Active Problems:   Schizoaffective disorder, bipolar type ([place])   Systemic lupus ([place])   Lupus (systemic lupus erythematosus) ([place])   ESRD (end stage renal disease) ([place])   Noncompliance    Discharge Instructions     Allergies  Allergen Reactions  . Ativan [Lorazepam] Swelling and Other (See Comments)    Dysarthria(patient has difficulty speaking and slurred speech); denies swelling, itching, pain, or numbness.  [name] [Ziprasidone Hcl] Itching and Swelling    Tongue swelling  . Keflex [Cephalexin] Swelling and Other (See Comments)    Tongue swelling. Can't talk   . Haldol [Haloperidol Lactate] Swelling    Tongue swelling. 05/31/15 - MD ok with giving as pt has tolerated in the past Pt can take benadryl.  . Other Itching    wool    Consultations:  Nephrology  Discharge Exam: Vitals:   12/10/15 1515 12/10/15 1545  BP: (!) 164/114 (!) 166/110  Pulse: (!) 107 (!) 105  Resp:    Temp:     Vitals:   12/10/15 1430 12/10/15 1445 12/10/15 1515 12/10/15 1545  BP: (!) 164/118 (!) 167/116 (!) 164/114 (!) 166/110  Pulse: (!) 107 (!) 110 (!) 107 (!) 105  Resp:       Temp:      TempSrc:      SpO2:      Weight:      Height:        Left against medical advise   The results of significant diagnostics from this hospitalization (including imaging, microbiology, ancillary and laboratory) are listed below for reference.     Microbiology: No results found for this or any previous visit (from the past 240 hour(s)).   Labs: BNP (last 3 results) No results for input(s): BNP in the last 8760 hours. Basic Metabolic Panel:  Recent Labs Lab 12/05/15 0821 12/05/15 1533 12/10/15 1240  NA 134* 134*  --   K 4.8 4.9  --   CL 97* 97*  --   CO2  --  26  --   GLUCOSE 83 61*  --   BUN 49* 57*  --   CREATININE 7.90* 8.17* 12.59*  CALCIUM  --  9.5  --   PHOS  --  6.3*  --    Liver Function Tests:  Recent Labs Lab 12/05/15 1533  ALBUMIN 3.2*   No results for input(s): LIPASE, AMYLASE in the last 168 hours. No results for input(s): AMMONIA in the last 168 hours. CBC:  Recent Labs Lab 12/05/15 0821 12/10/15 1240  WBC  --  6.6  HGB 9.9* 8.5*  HCT 29.0* 25.9*  MCV  --  84.1  PLT  --  185   Cardiac Enzymes: No results for input(s): CKTOTAL, CKMB, CKMBINDEX, TROPONINI  in the last 168 hours. BNP: Invalid input(s): POCBNP CBG: No results for input(s): GLUCAP in the last 168 hours. D-Dimer No results for input(s): DDIMER in the last 72 hours. Hgb A1c No results for input(s): HGBA1C in the last 72 hours. Lipid Profile No results for input(s): CHOL, HDL, LDLCALC, TRIG, CHOLHDL, LDLDIRECT in the last 72 hours. Thyroid function studies No results for input(s): TSH, T4TOTAL, T3FREE, THYROIDAB in the last 72 hours.  Invalid input(s): FREET3 Anemia work up No results for input(s): VITAMINB12, FOLATE, FERRITIN, TIBC, IRON, RETICCTPCT in the last 72 hours. Urinalysis    Component Value Date/Time   COLORURINE YELLOW 08/11/2015 1235   APPEARANCEUR HAZY (A) 08/11/2015 1235   LABSPEC 1.015 08/11/2015 1235   PHURINE 7.5 08/11/2015 1235    GLUCOSEU 100 (A) 08/11/2015 1235   HGBUR SMALL (A) 08/11/2015 1235   BILIRUBINUR NEGATIVE 08/11/2015 1235   KETONESUR NEGATIVE 08/11/2015 1235   PROTEINUR >300 (A) 08/11/2015 1235   UROBILINOGEN 0.2 01/09/2015 1645   NITRITE NEGATIVE 08/11/2015 1235   LEUKOCYTESUR SMALL (A) 08/11/2015 1235   Sepsis Labs Invalid input(s): PROCALCITONIN,  WBC,  LACTICIDVEN Microbiology No results found for this or any previous visit (from the past 240 hour(s)).   SIGNED:   Donne Hazel, MD  [facility] 12/10/2015, 4:29 PM  If 7PM-7AM, please contact night-coverage [email or web address] Password [name]

## 2015-12-10 NOTE — ED Triage Notes (Signed)
Presents for routine Dialysis, last on Wed

## 2015-12-10 NOTE — Procedures (Signed)
   HEMODIALYSIS TREATMENT NOTE:  4 hour low-heparin dialysis completed via right upper arm aVF (15g ante/retrograde). Goal met: 3 liters removed without interruption in ultrafiltration. All blood was returned and hemostasis was achieved within 15 minutes. Report given to Gershon Cull, RN.  Rockwell Alexandria, RN, CDN

## 2015-12-12 ENCOUNTER — Observation Stay (HOSPITAL_COMMUNITY)
Admission: EM | Admit: 2015-12-12 | Discharge: 2015-12-12 | Disposition: A | Discharge status: Home / Self Care | Payer: Medicare Other | Attending: Internal Medicine | Admitting: Internal Medicine

## 2015-12-12 ENCOUNTER — Encounter (HOSPITAL_COMMUNITY): Payer: Self-pay | Admitting: Emergency Medicine

## 2015-12-12 DIAGNOSIS — N186 End stage renal disease: Secondary | ICD-10-CM | POA: Diagnosis present

## 2015-12-12 DIAGNOSIS — Z9114 Patient's other noncompliance with medication regimen: Secondary | ICD-10-CM | POA: Insufficient documentation

## 2015-12-12 DIAGNOSIS — N2581 Secondary hyperparathyroidism of renal origin: Secondary | ICD-10-CM | POA: Insufficient documentation

## 2015-12-12 DIAGNOSIS — M3214 Glomerular disease in systemic lupus erythematosus: Secondary | ICD-10-CM | POA: Insufficient documentation

## 2015-12-12 DIAGNOSIS — D631 Anemia in chronic kidney disease: Secondary | ICD-10-CM | POA: Diagnosis not present

## 2015-12-12 DIAGNOSIS — I10 Essential (primary) hypertension: Secondary | ICD-10-CM | POA: Diagnosis present

## 2015-12-12 DIAGNOSIS — I132 Hypertensive heart and chronic kidney disease with heart failure and with stage 5 chronic kidney disease, or end stage renal disease: Secondary | ICD-10-CM | POA: Diagnosis not present

## 2015-12-12 DIAGNOSIS — I5022 Chronic systolic (congestive) heart failure: Secondary | ICD-10-CM | POA: Diagnosis not present

## 2015-12-12 DIAGNOSIS — Z992 Dependence on renal dialysis: Secondary | ICD-10-CM | POA: Diagnosis not present

## 2015-12-12 DIAGNOSIS — F250 Schizoaffective disorder, bipolar type: Secondary | ICD-10-CM | POA: Insufficient documentation

## 2015-12-12 DIAGNOSIS — F17210 Nicotine dependence, cigarettes, uncomplicated: Secondary | ICD-10-CM | POA: Insufficient documentation

## 2015-12-12 DIAGNOSIS — I120 Hypertensive chronic kidney disease with stage 5 chronic kidney disease or end stage renal disease: Secondary | ICD-10-CM | POA: Diagnosis not present

## 2015-12-12 DIAGNOSIS — N189 Chronic kidney disease, unspecified: Secondary | ICD-10-CM | POA: Diagnosis not present

## 2015-12-12 DIAGNOSIS — M329 Systemic lupus erythematosus, unspecified: Secondary | ICD-10-CM

## 2015-12-12 LAB — I-STAT CHEM 8, ED
BUN: 62 mg/dL — AB (ref 6–20)
CALCIUM ION: 1.12 mmol/L — AB (ref 1.15–1.40)
CHLORIDE: 98 mmol/L — AB (ref 101–111)
CREATININE: 10.1 mg/dL — AB (ref 0.44–1.00)
GLUCOSE: 91 mg/dL (ref 65–99)
HCT: 28 % — ABNORMAL LOW (ref 36.0–46.0)
Hemoglobin: 9.5 g/dL — ABNORMAL LOW (ref 12.0–15.0)
POTASSIUM: 5 mmol/L (ref 3.5–5.1)
Sodium: 135 mmol/L (ref 135–145)
TCO2: 28 mmol/L (ref 0–100)

## 2015-12-12 MED ORDER — LIDOCAINE HCL (PF) 1 % IJ SOLN
INTRAMUSCULAR | Status: AC
  Administered 2015-12-12: 0.5 mL via INTRADERMAL
  Filled 2015-12-12: qty 5

## 2015-12-12 MED ORDER — SODIUM CHLORIDE 0.9 % IV SOLN: 100.0000 mL | INTRAVENOUS | Status: DC | PRN

## 2015-12-12 MED ORDER — EPOETIN ALFA 4000 UNIT/ML IJ SOLN
4000.0000 [IU] | INTRAMUSCULAR | Status: DC
  Administered 2015-12-12: 4000 [IU] via INTRAVENOUS

## 2015-12-12 MED ORDER — TRAMADOL HCL 50 MG PO TABS
50.0000 mg | ORAL_TABLET | Freq: Two times a day (BID) | ORAL | Status: DC | PRN
  Administered 2015-12-12: 50 mg via ORAL

## 2015-12-12 MED ORDER — METOPROLOL TARTRATE 50 MG PO TABS: 50.0000 mg | ORAL_TABLET | Freq: Two times a day (BID) | ORAL | Status: DC

## 2015-12-12 MED ORDER — FUROSEMIDE 40 MG PO TABS: 120.0000 mg | ORAL_TABLET | Freq: Every day | ORAL | Status: DC

## 2015-12-12 MED ORDER — HEPARIN SODIUM (PORCINE) 1000 UNIT/ML IJ SOLN
INTRAMUSCULAR | Status: AC
  Administered 2015-12-12: 1500 [IU] via INTRAVENOUS_CENTRAL
  Filled 2015-12-12: qty 6

## 2015-12-12 MED ORDER — EPOETIN ALFA 4000 UNIT/ML IJ SOLN
INTRAMUSCULAR | Status: AC
Start: 2015-12-12 — End: 2015-12-12
  Administered 2015-12-12: 4000 [IU] via INTRAVENOUS
  Filled 2015-12-12: qty 1

## 2015-12-12 MED ORDER — LIDOCAINE HCL (PF) 1 % IJ SOLN
5.0000 mL | INTRAMUSCULAR | Status: DC | PRN
  Administered 2015-12-12: 0.5 mL via INTRADERMAL

## 2015-12-12 MED ORDER — DIPHENHYDRAMINE HCL 25 MG PO CAPS
ORAL_CAPSULE | ORAL | Status: AC
  Administered 2015-12-12: 25 mg via ORAL
  Filled 2015-12-12: qty 1

## 2015-12-12 MED ORDER — PREDNISONE 20 MG PO TABS: 20.0000 mg | ORAL_TABLET | Freq: Every day | ORAL | Status: DC

## 2015-12-12 MED ORDER — EPOETIN ALFA 4000 UNIT/ML IJ SOLN: 4000.0000 [IU] | INTRAMUSCULAR | Status: DC

## 2015-12-12 MED ORDER — TRAMADOL HCL 50 MG PO TABS
ORAL_TABLET | ORAL | Status: AC
  Administered 2015-12-12: 50 mg via ORAL
  Filled 2015-12-12: qty 1

## 2015-12-12 MED ORDER — SEVELAMER CARBONATE 800 MG PO TABS
ORAL_TABLET | ORAL | Status: AC
Start: 2015-12-12 — End: 2015-12-12
  Administered 2015-12-12: 3200 mg via ORAL
  Filled 2015-12-12: qty 4

## 2015-12-12 MED ORDER — TRAMADOL HCL 50 MG PO TABS: 50.0000 mg | ORAL_TABLET | Freq: Four times a day (QID) | ORAL | Status: DC | PRN

## 2015-12-12 MED ORDER — SEVELAMER CARBONATE 800 MG PO TABS: 3200.0000 mg | ORAL_TABLET | Freq: Three times a day (TID) | ORAL | Status: DC

## 2015-12-12 MED ORDER — PENTAFLUOROPROP-TETRAFLUOROETH EX AERO: 1.0000 "application " | INHALATION_SPRAY | CUTANEOUS | Status: DC | PRN

## 2015-12-12 MED ORDER — DIPHENHYDRAMINE HCL 25 MG PO CAPS
25.0000 mg | ORAL_CAPSULE | Freq: Four times a day (QID) | ORAL | Status: DC | PRN
Start: 2015-12-12 — End: 2015-12-12
  Administered 2015-12-12: 25 mg via ORAL

## 2015-12-12 MED ORDER — HEPARIN SODIUM (PORCINE) 1000 UNIT/ML DIALYSIS
20.0000 [IU]/kg | INTRAMUSCULAR | Status: DC | PRN
  Administered 2015-12-12: 1500 [IU] via INTRAVENOUS_CENTRAL

## 2015-12-12 MED ORDER — SEVELAMER CARBONATE 800 MG PO TABS
3200.0000 mg | ORAL_TABLET | Freq: Three times a day (TID) | ORAL | Status: DC
  Administered 2015-12-12: 3200 mg via ORAL

## 2015-12-12 NOTE — Discharge Summary (Signed)
Physician Discharge Summary  Sara [name] [name] DOB: 1981-02-28 DOA: 12/12/2015  PCP: Pcp Not In System  Admit date: 12/12/2015 Discharge date: 12/12/2015  Admitted From: home Disposition:  home  Recommendations for Outpatient Follow-up:  1. Follow up with PCP in 1-2 weeks 2. Please obtain BMP/CBC in one week 3. Please follow up on the following pending results:  Home Health:No Equipment/Devices:  Discharge Condition: stable CODE STATUS: full Diet recommendation: Heart Healthy  Brief/Interim Summary: Sara [name] a 34 y.o.femalewith medical history significant of ESRD on HD, lupus on chronic prednisone, HTN, who presents to [facility] today for regular dialysis. She denies any shortness of breath or chest pain, she has not had any worsening of LE edema. Patient is not set up with outpatient dialysis center and comes to [facility] to undergo dialysis. She has no new complaints today and feels well  Patient completed dialysis session without problems. She does not have any complaints at this time. She will be discharged in stable condition.  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 Reported on 06/12/2015   furosemide 40 MG tablet Commonly known as:  LASIX Take 3 tablets (120 mg total) by mouth daily.   metoprolol 50 MG tablet Commonly known as:  LOPRESSOR Take 1 tablet (50 mg total) by mouth 2 (two) times daily.   predniSONE 20 MG tablet Commonly  known as:  DELTASONE Take 1 tablet (20 mg total) by mouth daily with breakfast.   risperiDONE microspheres 25 MG injection Commonly known as:  RISPERDAL CONSTA Inject 25 mg into the muscle every 14 (fourteen) days.   sevelamer carbonate 800 MG tablet Commonly known as:  RENVELA Take 4 tablets (3,200 mg total) by mouth 3 (three) times daily with meals.   traMADol 50 MG tablet Commonly known as:  ULTRAM Take 1 tablet (50 mg total) by mouth every 6 (six) hours as needed for moderate pain.       Allergies  Allergen Reactions  . Ativan [Lorazepam] Swelling and Other (See Comments)    Dysarthria(patient has difficulty speaking and slurred speech); denies swelling, itching, pain, or numbness.  [name] [Ziprasidone Hcl] Itching and Swelling    Tongue swelling  . Keflex [Cephalexin] Swelling and Other (See Comments)    Tongue swelling. Can't talk   . Haldol [Haloperidol Lactate] Swelling    Tongue swelling. 05/31/15 - MD ok with giving as pt has tolerated in the past Pt can take benadryl.  . Other Itching    wool    Consultations:  Nephrology   Procedures/Studies:    Subjective: No shortness of breath or chest pain  Discharge Exam: Vitals:   12/12/15 1430 12/12/15 1500  BP: (!) 151/101 (!) 159/104  Pulse: 102 104  Resp:    Temp:     Vitals:   12/12/15 1330 12/12/15 1400 12/12/15 1430 12/12/15 1500  BP: (!) 160/111 (!) 154/102 Marland Kitchen)  151/101 (!) 159/104  Pulse: 109 103 102 104  Resp:      Temp:      TempSrc:      SpO2:      Weight:      Height:        General: Pt is alert, awake, not in acute distress Cardiovascular: RRR, S1/S2 +, no rubs, no gallops Respiratory: CTA bilaterally, no wheezing, no rhonchi Abdominal: Soft, NT, ND, bowel sounds + Extremities: no edema, no cyanosis    The results of significant diagnostics from this hospitalization (including imaging, microbiology, ancillary and laboratory) are listed below for reference.      Microbiology: No results found for this or any previous visit (from the past 240 hour(s)).   Labs: BNP (last 3 results) No results for input(s): BNP in the last 8760 hours. Basic Metabolic Panel:  Recent Labs Lab 12/10/15 1240 12/12/15 0746  NA  --  135  K  --  5.0  CL  --  98*  GLUCOSE  --  91  BUN  --  62*  CREATININE 12.59* 10.10*   Liver Function Tests: No results for input(s): AST, ALT, ALKPHOS, BILITOT, PROT, ALBUMIN in the last 168 hours. No results for input(s): LIPASE, AMYLASE in the last 168 hours. No results for input(s): AMMONIA in the last 168 hours. CBC:  Recent Labs Lab 12/10/15 1240 12/12/15 0746  WBC 6.6  --   HGB 8.5* 9.5*  HCT 25.9* 28.0*  MCV 84.1  --   PLT 185  --    Cardiac Enzymes: No results for input(s): CKTOTAL, CKMB, CKMBINDEX, TROPONINI in the last 168 hours. BNP: Invalid input(s): POCBNP CBG: No results for input(s): GLUCAP in the last 168 hours. D-Dimer No results for input(s): DDIMER in the last 72 hours. Hgb A1c No results for input(s): HGBA1C in the last 72 hours. Lipid Profile No results for input(s): CHOL, HDL, LDLCALC, TRIG, CHOLHDL, LDLDIRECT in the last 72 hours. Thyroid function studies No results for input(s): TSH, T4TOTAL, T3FREE, THYROIDAB in the last 72 hours.  Invalid input(s): FREET3 Anemia work up No results for input(s): VITAMINB12, FOLATE, FERRITIN, TIBC, IRON, RETICCTPCT in the last 72 hours. Urinalysis    Component Value Date/Time   COLORURINE YELLOW 08/11/2015 1235   APPEARANCEUR HAZY (A) 08/11/2015 1235   LABSPEC 1.015 08/11/2015 1235   PHURINE 7.5 08/11/2015 1235   GLUCOSEU 100 (A) 08/11/2015 1235   HGBUR SMALL (A) 08/11/2015 1235   BILIRUBINUR NEGATIVE 08/11/2015 1235   KETONESUR NEGATIVE 08/11/2015 1235   PROTEINUR >300 (A) 08/11/2015 1235   UROBILINOGEN 0.2 01/09/2015 1645   NITRITE NEGATIVE 08/11/2015 1235   LEUKOCYTESUR SMALL (A) 08/11/2015 1235   Sepsis Labs Invalid input(s):  PROCALCITONIN,  WBC,  LACTICIDVEN Microbiology No results found for this or any previous visit (from the past 240 hour(s)).   Time coordinating discharge: Over 30 minutes  SIGNED:   Kathie Dike, MD  [facility] 12/12/2015, 3:49 PM Pager   If 7PM-7AM, please contact night-coverage [email or web address] Password [name]
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
## 2015-12-12 NOTE — ED Triage Notes (Signed)
Pt reports here for dialysis. Pt denies any pain,sob. Pt reports last dialyzed on 12/10/15.

## 2015-12-12 NOTE — Consult Note (Signed)
Sara [name] MRN: [id number] DOB/AGE: [birth year] 34 y.o. Primary Care Physician:Pcp Not In System Admit date: 12/12/2015 Chief Complaint:  Chief Complaint  Patient presents with  . Other    dialysis   HPI: Pt is 35 year old female with past medical hx of ESRD who was admitted with c/o dyspnea," I need dialysis "  HPI dates back to few days ago started feeling short of breath,and so pt came to ER asking for her dialysis. Pt says " I need my dialysis . I had my dialysis this last Wednesday on  12/10/15 ". No c/o fever/cough/chills NO c/o nausea/vomiting. NO c/o abdominal pain. No c/o hematuria      Past Medical History:  Diagnosis Date  . Acute myopericarditis    hx/notes 10/09/2014  . Bipolar disorder ([place])    [name] 10/09/2014  . CHF (congestive heart failure) ([place])    systolic/notes 123XX123  . Chronic anemia    [name] 10/09/2014  . ESRD (end stage renal disease) on dialysis [facility])    "[name]; [facility]" (10/09/2014)  . History of blood transfusion    "this is probably my 3rd" (10/09/2014)  . Hypertension   . Low back pain   . Lupus ([place])    lupus w nephritis  . 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 Non-compliant patient   . Positive ANA (antinuclear antibody) 08/16/2012  . 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 Pregnancy   . Psychosis   . Schizoaffective disorder, bipolar type ([place]) 11/20/2014  . Schizophrenia ([place])    [name] 10/09/2014  . Tobacco abuse 02/20/2014        Family History  Problem Relation Age of Onset  . Drug abuse Father   . Kidney disease Father     Social History:  reports that she has been smoking Cigarettes.  She has a 2.00 pack-year smoking history. She has never used smokeless tobacco. She reports that she does not drink alcohol or use drugs.   Allergies:  Allergies  Allergen Reactions  . Ativan [Lorazepam] Swelling and Other (See Comments)    Dysarthria(patient has difficulty speaking and slurred speech); denies swelling, itching, pain, or numbness.   [name] [Ziprasidone Hcl] Itching and Swelling    Tongue swelling  . Keflex [Cephalexin] Swelling and Other (See Comments)    Tongue swelling. Can't talk   . Haldol [Haloperidol Lactate] Swelling    Tongue swelling. 05/31/15 - MD ok with giving as pt has tolerated in the past Pt can take benadryl.  . Other Itching    wool     (Not in a hospital admission)     [id number] from the symptoms mentioned above,there are no other symptoms referable to all systems reviewed.      Physical Exam: Vital signs in last 24 hours: Temp:  [98.4 F (36.9 C)] 98.4 F (36.9 C) (09/06 0741) Pulse Rate:  [113] 113 (09/06 0741) Resp:  [18] 18 (09/06 0741) BP: (150-163)/(110-112) 163/110 (09/06 0830) SpO2:  [100 %] 100 % (09/06 0741) Weight:  [163 lb 8 oz (74.2 kg)] 163 lb 8 oz (74.2 kg) (09/06 0740) Weight change:     Intake/Output from previous day: No intake/output data recorded. No intake/output data recorded.   Physical Exam: General- pt is awake,alert, follows coomands Resp- No acute REsp distress,  decreased bs at bases. CVS- S1S2 regular in rate and rhythm, NO rubs  GIT- BS+, soft, NT, ND EXT- 1+ LE Edema, NO Cyanosis CNS- CN 2-12 grossly intact. Moving all 4 extremities Access- AVF+  Lab Results:  CBC    Component Value Date/Time   WBC 6.6 12/10/2015 1240   RBC 3.08 (L) 12/10/2015 1240   HGB 9.5 (L) 12/12/2015 0746   HCT 28.0 (L) 12/12/2015 0746   PLT 185 12/10/2015 1240   MCV 84.1 12/10/2015 1240   MCH 27.6 12/10/2015 1240   MCHC 32.8 12/10/2015 1240   RDW 17.2 (H) 12/10/2015 1240   LYMPHSABS 1.0 09/21/2015 0841   MONOABS 0.7 09/21/2015 0841   EOSABS 0.1 09/21/2015 0841   BASOSABS 0.0 09/21/2015 0841      BMET BMP Latest Ref Rng & Units 12/12/2015 12/10/2015 12/05/2015  Glucose 65 - 99 mg/dL 91 - 61(L)  BUN 6 - 20 mg/dL 62(H) - 57(H)  Creatinine 0.44 - 1.00 mg/dL 10.10(H) 12.59(H) 8.17(H)  Sodium 135 - 145 mmol/L 135 - 134(L)  Potassium 3.5 - 5.1 mmol/L  5.0 - 4.9  Chloride 101 - 111 mmol/L 98(L) - 97(L)  CO2 22 - 32 mmol/L - - 26  Calcium 8.9 - 10.3 mg/dL - - 9.5         Lab Results  Component Value Date   PTH 1,100 (H) 11/19/2015   PTH Comment 11/19/2015   CALCIUM 9.5 12/05/2015   CAION 1.12 (L) 12/12/2015   PHOS 6.3 (H) 12/05/2015      Impression: 1)Renal  ESRD on HD                Pt is not on regular  Schedule sec to her complaince/adherence issues                Will dialyze pt today  2)HTN Bp is not at goal  3)Anemia In ESRD the goal for HGb is 9--11. 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   Not at goal sec to non adherence to hd tx Phosphorus not at goal.  on binders  5)Psych .  Hx of   psycosis Schizophrenia Primary MD following  6)Electrolytes  Normokalemic but K on higher side  Hyponatremic    Sec to ESRD  7)Acid base Co2  at goal    Plan:  Will dialyze today Will use 2 k bath Will keep on epo Will try to take 2.5-3 liters off if possible   Monicka Cyran S 12/12/2015, 9:19 AM

## 2015-12-12 NOTE — Procedures (Signed)
   HEMODIALYSIS TREATMENT NOTE:  4 hour low-heparin dialysis completed via right upper arm AVF (15g ante/retrograde). Goal met: 3.1 liters removed without interruption in ultrafiltration. All blood was returned and hemostasis was achieved within 15 minutes.  Home meds reviewed. Sodium and diet restrictions reinforced.  Report called to [place] Deck, RN although pt left hospital from HD unit, ambulatory with steady gait.  Rockwell Alexandria, RN, CDN
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
## 2015-12-14 ENCOUNTER — Encounter (HOSPITAL_COMMUNITY): Payer: Self-pay | Admitting: Emergency Medicine

## 2015-12-14 ENCOUNTER — Observation Stay (HOSPITAL_COMMUNITY)
Admission: EM | Admit: 2015-12-14 | Discharge: 2015-12-14 | Disposition: A | Discharge status: Home / Self Care | Payer: Medicare Other | Attending: Internal Medicine | Admitting: Internal Medicine

## 2015-12-14 DIAGNOSIS — N186 End stage renal disease: Secondary | ICD-10-CM | POA: Diagnosis not present

## 2015-12-14 DIAGNOSIS — Z992 Dependence on renal dialysis: Secondary | ICD-10-CM | POA: Diagnosis not present

## 2015-12-14 DIAGNOSIS — F17210 Nicotine dependence, cigarettes, uncomplicated: Secondary | ICD-10-CM | POA: Diagnosis not present

## 2015-12-14 DIAGNOSIS — Z79899 Other long term (current) drug therapy: Secondary | ICD-10-CM | POA: Insufficient documentation

## 2015-12-14 DIAGNOSIS — I132 Hypertensive heart and chronic kidney disease with heart failure and with stage 5 chronic kidney disease, or end stage renal disease: Secondary | ICD-10-CM | POA: Diagnosis not present

## 2015-12-14 DIAGNOSIS — I5022 Chronic systolic (congestive) heart failure: Secondary | ICD-10-CM | POA: Diagnosis not present

## 2015-12-14 DIAGNOSIS — I120 Hypertensive chronic kidney disease with stage 5 chronic kidney disease or end stage renal disease: Secondary | ICD-10-CM | POA: Diagnosis not present

## 2015-12-14 LAB — RENAL FUNCTION PANEL
ALBUMIN: 3.2 g/dL — AB (ref 3.5–5.0)
Anion gap: 7 (ref 5–15)
BUN: 52 mg/dL — AB (ref 6–20)
CALCIUM: 8.8 mg/dL — AB (ref 8.9–10.3)
CHLORIDE: 99 mmol/L — AB (ref 101–111)
CO2: 26 mmol/L (ref 22–32)
CREATININE: 9.05 mg/dL — AB (ref 0.44–1.00)
GFR calc Af Amer: 6 mL/min — ABNORMAL LOW (ref >60)
GFR, EST NON AFRICAN AMERICAN: 5 mL/min — AB (ref >60)
Glucose, Bld: 75 mg/dL (ref 65–99)
PHOSPHORUS: 6.9 mg/dL — AB (ref 2.5–4.6)
Potassium: 4.7 mmol/L (ref 3.5–5.1)
SODIUM: 132 mmol/L — AB (ref 135–145)

## 2015-12-14 LAB — CBC
HCT: 25.1 % — ABNORMAL LOW (ref 36.0–46.0)
Hemoglobin: 8 g/dL — ABNORMAL LOW (ref 12.0–15.0)
MCH: 26.9 pg (ref 26.0–34.0)
MCHC: 31.9 g/dL (ref 30.0–36.0)
MCV: 84.5 fL (ref 78.0–100.0)
PLATELETS: ADEQUATE 10*3/uL (ref 150–400)
RBC: 2.97 MIL/uL — ABNORMAL LOW (ref 3.87–5.11)
RDW: 17 % — AB (ref 11.5–15.5)
WBC: 6.5 10*3/uL (ref 4.0–10.5)

## 2015-12-14 MED ORDER — DOXERCALCIFEROL 4 MCG/2ML IV SOLN
5.0000 ug | INTRAVENOUS | Status: DC
  Administered 2015-12-14: 5 ug via INTRAVENOUS
  Filled 2015-12-14 (×2): qty 4

## 2015-12-14 MED ORDER — SODIUM CHLORIDE 0.9 % IV SOLN: 100.0000 mL | INTRAVENOUS | Status: DC | PRN

## 2015-12-14 MED ORDER — TRAMADOL HCL 50 MG PO TABS
50.0000 mg | ORAL_TABLET | Freq: Two times a day (BID) | ORAL | Status: DC | PRN
  Administered 2015-12-14: 50 mg via ORAL

## 2015-12-14 MED ORDER — SEVELAMER CARBONATE 800 MG PO TABS
ORAL_TABLET | ORAL | Status: AC
  Administered 2015-12-14: 800 mg
  Filled 2015-12-14: qty 4

## 2015-12-14 MED ORDER — HEPARIN SODIUM (PORCINE) 1000 UNIT/ML DIALYSIS
20.0000 [IU]/kg | INTRAMUSCULAR | Status: DC | PRN
  Administered 2015-12-14: 1500 [IU] via INTRAVENOUS_CENTRAL
  Filled 2015-12-14 (×2): qty 2

## 2015-12-14 MED ORDER — HEPARIN SODIUM (PORCINE) 1000 UNIT/ML IJ SOLN
INTRAMUSCULAR | Status: AC
  Administered 2015-12-14: 1500 [IU] via INTRAVENOUS_CENTRAL
  Filled 2015-12-14: qty 6

## 2015-12-14 MED ORDER — EPOETIN ALFA 10000 UNIT/ML IJ SOLN
10000.0000 [IU] | INTRAMUSCULAR | Status: DC
  Administered 2015-12-14: 10000 [IU] via INTRAVENOUS
  Filled 2015-12-14 (×2): qty 1

## 2015-12-14 MED ORDER — DOXERCALCIFEROL 4 MCG/2ML IV SOLN
INTRAVENOUS | Status: AC
  Administered 2015-12-14: 5 ug via INTRAVENOUS
  Filled 2015-12-14: qty 4

## 2015-12-14 MED ORDER — LIDOCAINE HCL (PF) 1 % IJ SOLN
INTRAMUSCULAR | Status: AC
  Administered 2015-12-14: 0.5 mL via INTRADERMAL
  Filled 2015-12-14: qty 5

## 2015-12-14 MED ORDER — EPOETIN ALFA 10000 UNIT/ML IJ SOLN
INTRAMUSCULAR | Status: AC
  Administered 2015-12-14: 10000 [IU] via INTRAVENOUS
  Filled 2015-12-14: qty 1

## 2015-12-14 MED ORDER — LIDOCAINE HCL (PF) 1 % IJ SOLN
5.0000 mL | INTRAMUSCULAR | Status: DC | PRN
Start: 2015-12-14 — End: 2015-12-14
  Administered 2015-12-14: 0.5 mL via INTRADERMAL

## 2015-12-14 MED ORDER — DIPHENHYDRAMINE HCL 25 MG PO CAPS
ORAL_CAPSULE | ORAL | Status: AC
  Administered 2015-12-14: 25 mg via ORAL
  Filled 2015-12-14: qty 1

## 2015-12-14 MED ORDER — SEVELAMER CARBONATE 800 MG PO TABS
3200.0000 mg | ORAL_TABLET | Freq: Three times a day (TID) | ORAL | Status: DC
  Administered 2015-12-14: 3200 mg via ORAL

## 2015-12-14 MED ORDER — DIPHENHYDRAMINE HCL 25 MG PO CAPS
25.0000 mg | ORAL_CAPSULE | Freq: Four times a day (QID) | ORAL | Status: DC | PRN
  Administered 2015-12-14: 25 mg via ORAL

## 2015-12-14 MED ORDER — PENTAFLUOROPROP-TETRAFLUOROETH EX AERO
INHALATION_SPRAY | CUTANEOUS | Status: AC
  Administered 2015-12-14: 1 via TOPICAL
  Filled 2015-12-14: qty 103.5

## 2015-12-14 MED ORDER — TRAMADOL HCL 50 MG PO TABS
ORAL_TABLET | ORAL | Status: AC
  Administered 2015-12-14: 50 mg via ORAL
  Filled 2015-12-14: qty 1

## 2015-12-14 MED ORDER — PENTAFLUOROPROP-TETRAFLUOROETH EX AERO
1.0000 "application " | INHALATION_SPRAY | CUTANEOUS | Status: DC | PRN
  Administered 2015-12-14: 1 via TOPICAL

## 2015-12-14 NOTE — ED Notes (Signed)
Pt given cup of icewater, 2 warm blankets, pt resting at this time.

## 2015-12-14 NOTE — ED Notes (Signed)
Attempted report x1. 

## 2015-12-14 NOTE — ED Triage Notes (Signed)
Pt requesting dialysis, last session Wednesday, no acute pain.

## 2015-12-14 NOTE — ED Notes (Signed)
I stat results would not cross over.   na:134 k 4.6 Cl 94 ica 1.21 tc02 28 glu85 Bun 55 crea 8.7 hct <15 %PCV  An gap 17 mmol/L
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
## 2015-12-14 NOTE — Procedures (Signed)
HEMODIALYSIS TREATMENT NOTE:  4.25 hour low-heparin dialysis completed via right upper arm AVF (15g ante/retrograde). Goal met: 4 liters removed without interruption in ultrafiltration. HD was stopped with 15 minutes remaining due to clotting venous circuit, despite heparin and routine NS flushes.  Unable to return venous circuit, EBL 200cc. Post HD BP 141/92.  Dr. Befekadu was notified. Dr. Hernandez evaluated pt at end of dialysis and wrote discharge orders.  Pt stated she wished to leave hospital from dialysis unit.  Primary RN (Tamika Watkins) notified and stated she would come to HD unit in a few minutes with AVS/discharge paperwork.  Pt waited for 5 minutes then stated she needed to catch her ride.  She signed the "Leaving Hospital Against Medical Advice" waiver and left HD unit ambulatory with steady gait.   , RN, CDN 

## 2015-12-14 NOTE — Discharge Summary (Signed)
Physician Discharge Summary  Sara [name] [name] DOB: Aug 07, 1980 DOA: 12/14/2015  PCP: Pcp Not In System  Admit date: 12/14/2015 Discharge date: 12/14/2015  Time spent: 45 minutes  Recommendations for Outpatient Follow-up:  -Will be discharged home today.   Discharge Diagnoses:  Principal Problem:   ESRD (end stage renal disease) on dialysis [facility]) Active Problems:   ESRD (end stage renal disease) ([place])   Discharge Condition: Stable and improved  Filed Weights   12/14/15 0815 12/14/15 1145  Weight: 73.9 kg (163 lb) 74.1 kg (163 lb 5.8 oz)    History of present illness:  Admitted for HD purposes. See H and P for details.  Hospital Course:   ESRD -Has received scheduled HD uneventfully. -Will be discharged home in satisfactory condition.  Procedures:  HD   Consultations:  Nephrology  Discharge Instructions  Discharge Instructions    Diet - low sodium heart healthy    Complete by:  As directed   Increase activity slowly    Complete by:  As directed       Medication List    TAKE these medications   acetaminophen 500 MG tablet Commonly known as:  TYLENOL Take 1,000 mg by mouth every 6 (six) hours as needed for moderate pain. Reported on 06/12/2015   furosemide 40 MG tablet Commonly known as:  LASIX Take 3 tablets (120 mg total) by mouth daily.   metoprolol 50 MG tablet Commonly known as:  LOPRESSOR Take 1 tablet (50 mg total) by mouth 2 (two) times daily.   predniSONE 20 MG tablet Commonly known as:  DELTASONE Take 1 tablet (20 mg total) by mouth daily with breakfast.   risperiDONE microspheres 25 MG injection Commonly known as:  RISPERDAL CONSTA Inject 25 mg into the muscle every 14 (fourteen) days.   sevelamer carbonate 800 MG tablet Commonly known as:  RENVELA Take 4 tablets (3,200 mg total) by mouth 3 (three) times daily with meals.   traMADol 50 MG tablet Commonly known as:  ULTRAM Take 1 tablet (50 mg total) by mouth every 6  (six) hours as needed for moderate pain.      Allergies  Allergen Reactions  . Ativan [Lorazepam] Swelling and Other (See Comments)    Dysarthria(patient has difficulty speaking and slurred speech); denies swelling, itching, pain, or numbness.  [name] [Ziprasidone Hcl] Itching and Swelling    Tongue swelling  . Keflex [Cephalexin] Swelling and Other (See Comments)    Tongue swelling. Can't talk   . Haldol [Haloperidol Lactate] Swelling    Tongue swelling. 05/31/15 - MD ok with giving as pt has tolerated in the past Pt can take benadryl.  . Other Itching    wool      The results of significant diagnostics from this hospitalization (including imaging, microbiology, ancillary and laboratory) are listed below for reference.    Significant Diagnostic Studies: No results found.  Microbiology: No results found for this or any previous visit (from the past 240 hour(s)).   Labs: Basic Metabolic Panel:  Recent Labs Lab 12/10/15 1240 12/12/15 0746 12/14/15 1258  NA  --  135 132*  K  --  5.0 4.7  CL  --  98* 99*  CO2  --   --  26  GLUCOSE  --  91 75  BUN  --  62* 52*  CREATININE 12.59* 10.10* 9.05*  CALCIUM  --   --  8.8*  PHOS  --   --  6.9*   Liver Function  Tests:  Recent Labs Lab 12/14/15 1258  ALBUMIN 3.2*   No results for input(s): LIPASE, AMYLASE in the last 168 hours. No results for input(s): AMMONIA in the last 168 hours. CBC:  Recent Labs Lab 12/10/15 1240 12/12/15 0746 12/14/15 1258  WBC 6.6  --  6.5  HGB 8.5* 9.5* 8.0*  HCT 25.9* 28.0* 25.1*  MCV 84.1  --  84.5  PLT 185  --  PLATELET CLUMPS NOTED ON SMEAR, COUNT APPEARS ADEQUATE   Cardiac Enzymes: No results for input(s): CKTOTAL, CKMB, CKMBINDEX, TROPONINI in the last 168 hours. BNP: BNP (last 3 results) No results for input(s): BNP in the last 8760 hours.  ProBNP (last 3 results) No results for input(s): PROBNP in the last 8760 hours.  CBG: No results for input(s): GLUCAP in the last  168 hours.     SignedLelon Frohlich  [facility] Pager: [phone] 12/14/2015, 4:35 PM
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
## 2015-12-17 ENCOUNTER — Observation Stay (HOSPITAL_COMMUNITY)
Admission: EM | Admit: 2015-12-17 | Discharge: 2015-12-17 | Disposition: A | Discharge status: Home / Self Care | Payer: Medicare Other | Attending: Family Medicine | Admitting: Family Medicine

## 2015-12-17 ENCOUNTER — Encounter (HOSPITAL_COMMUNITY): Payer: Self-pay

## 2015-12-17 DIAGNOSIS — I10 Essential (primary) hypertension: Secondary | ICD-10-CM

## 2015-12-17 DIAGNOSIS — Z992 Dependence on renal dialysis: Secondary | ICD-10-CM | POA: Insufficient documentation

## 2015-12-17 DIAGNOSIS — I5022 Chronic systolic (congestive) heart failure: Secondary | ICD-10-CM | POA: Diagnosis not present

## 2015-12-17 DIAGNOSIS — I132 Hypertensive heart and chronic kidney disease with heart failure and with stage 5 chronic kidney disease, or end stage renal disease: Secondary | ICD-10-CM | POA: Diagnosis not present

## 2015-12-17 DIAGNOSIS — Z79899 Other long term (current) drug therapy: Secondary | ICD-10-CM | POA: Insufficient documentation

## 2015-12-17 DIAGNOSIS — F17210 Nicotine dependence, cigarettes, uncomplicated: Secondary | ICD-10-CM | POA: Insufficient documentation

## 2015-12-17 DIAGNOSIS — I509 Heart failure, unspecified: Secondary | ICD-10-CM | POA: Diagnosis not present

## 2015-12-17 DIAGNOSIS — N186 End stage renal disease: Secondary | ICD-10-CM | POA: Diagnosis not present

## 2015-12-17 DIAGNOSIS — I120 Hypertensive chronic kidney disease with stage 5 chronic kidney disease or end stage renal disease: Secondary | ICD-10-CM | POA: Diagnosis not present

## 2015-12-17 LAB — CBC
HCT: 24 % — ABNORMAL LOW (ref 36.0–46.0)
Hemoglobin: 7.7 g/dL — ABNORMAL LOW (ref 12.0–15.0)
MCH: 27.2 pg (ref 26.0–34.0)
MCHC: 32.1 g/dL (ref 30.0–36.0)
MCV: 84.8 fL (ref 78.0–100.0)
PLATELETS: 231 10*3/uL (ref 150–400)
RBC: 2.83 MIL/uL — ABNORMAL LOW (ref 3.87–5.11)
RDW: 17.2 % — AB (ref 11.5–15.5)
WBC: 6.1 10*3/uL (ref 4.0–10.5)

## 2015-12-17 LAB — I-STAT CHEM 8, ED
BUN: 50 mg/dL — ABNORMAL HIGH (ref 6–20)
CHLORIDE: 99 mmol/L — AB (ref 101–111)
Calcium, Ion: 1.18 mmol/L (ref 1.15–1.40)
Creatinine, Ser: 9.9 mg/dL — ABNORMAL HIGH (ref 0.44–1.00)
Glucose, Bld: 85 mg/dL (ref 65–99)
HEMATOCRIT: 29 % — AB (ref 36.0–46.0)
HEMOGLOBIN: 9.9 g/dL — AB (ref 12.0–15.0)
POTASSIUM: 4.5 mmol/L (ref 3.5–5.1)
SODIUM: 136 mmol/L (ref 135–145)
TCO2: 27 mmol/L (ref 0–100)

## 2015-12-17 LAB — RENAL FUNCTION PANEL
ALBUMIN: 3.3 g/dL — AB (ref 3.5–5.0)
Anion gap: 15 (ref 5–15)
BUN: 56 mg/dL — AB (ref 6–20)
CALCIUM: 9.2 mg/dL (ref 8.9–10.3)
CO2: 24 mmol/L (ref 22–32)
CREATININE: 11.01 mg/dL — AB (ref 0.44–1.00)
Chloride: 96 mmol/L — ABNORMAL LOW (ref 101–111)
GFR calc Af Amer: 5 mL/min — ABNORMAL LOW (ref >60)
GFR, EST NON AFRICAN AMERICAN: 4 mL/min — AB (ref >60)
Glucose, Bld: 67 mg/dL (ref 65–99)
PHOSPHORUS: 7 mg/dL — AB (ref 2.5–4.6)
POTASSIUM: 4.3 mmol/L (ref 3.5–5.1)
SODIUM: 135 mmol/L (ref 135–145)

## 2015-12-17 MED ORDER — EPOETIN ALFA 10000 UNIT/ML IJ SOLN
10000.0000 [IU] | INTRAMUSCULAR | Status: DC
  Administered 2015-12-17: 10000 [IU] via INTRAVENOUS
  Filled 2015-12-17 (×2): qty 1

## 2015-12-17 MED ORDER — DIPHENHYDRAMINE HCL 25 MG PO CAPS: 25.0000 mg | ORAL_CAPSULE | Freq: Once | ORAL | Status: DC

## 2015-12-17 MED ORDER — TRAMADOL HCL 50 MG PO TABS: 50.0000 mg | ORAL_TABLET | Freq: Four times a day (QID) | ORAL | Status: DC | PRN

## 2015-12-17 MED ORDER — SEVELAMER CARBONATE 800 MG PO TABS
ORAL_TABLET | ORAL | Status: AC
  Administered 2015-12-17: 2400 mg via ORAL
  Filled 2015-12-17: qty 3

## 2015-12-17 MED ORDER — LIDOCAINE HCL (PF) 1 % IJ SOLN
INTRAMUSCULAR | Status: AC
  Administered 2015-12-17: 5 mL via INTRADERMAL
  Filled 2015-12-17: qty 5

## 2015-12-17 MED ORDER — HEPARIN SODIUM (PORCINE) 1000 UNIT/ML DIALYSIS
20.0000 [IU]/kg | INTRAMUSCULAR | Status: DC | PRN
  Administered 2015-12-17: 1500 [IU] via INTRAVENOUS_CENTRAL
  Filled 2015-12-17 (×2): qty 2

## 2015-12-17 MED ORDER — HEPARIN SODIUM (PORCINE) 1000 UNIT/ML IJ SOLN
INTRAMUSCULAR | Status: AC
  Administered 2015-12-17: 1500 [IU] via INTRAVENOUS_CENTRAL
  Filled 2015-12-17: qty 6

## 2015-12-17 MED ORDER — SEVELAMER CARBONATE 800 MG PO TABS
2400.0000 mg | ORAL_TABLET | Freq: Three times a day (TID) | ORAL | Status: DC
  Administered 2015-12-17: 2400 mg via ORAL
  Filled 2015-12-17 (×7): qty 3

## 2015-12-17 MED ORDER — DIPHENHYDRAMINE HCL 25 MG PO CAPS
25.0000 mg | ORAL_CAPSULE | Freq: Four times a day (QID) | ORAL | Status: DC | PRN
  Administered 2015-12-17: 25 mg via ORAL

## 2015-12-17 MED ORDER — DIPHENHYDRAMINE HCL 25 MG PO CAPS
ORAL_CAPSULE | ORAL | Status: AC
  Administered 2015-12-17: 25 mg via ORAL
  Filled 2015-12-17: qty 1

## 2015-12-17 MED ORDER — PENTAFLUOROPROP-TETRAFLUOROETH EX AERO: 1.0000 "application " | INHALATION_SPRAY | CUTANEOUS | Status: DC | PRN

## 2015-12-17 MED ORDER — LIDOCAINE HCL (PF) 1 % IJ SOLN
5.0000 mL | INTRAMUSCULAR | Status: DC | PRN
  Administered 2015-12-17: 5 mL via INTRADERMAL

## 2015-12-17 MED ORDER — TRAMADOL HCL 50 MG PO TABS
50.0000 mg | ORAL_TABLET | Freq: Once | ORAL | Status: AC
  Administered 2015-12-17: 50 mg via ORAL

## 2015-12-17 MED ORDER — DOXERCALCIFEROL 4 MCG/2ML IV SOLN
INTRAVENOUS | Status: AC
  Administered 2015-12-17: 5 ug via INTRAVENOUS
  Filled 2015-12-17: qty 4

## 2015-12-17 MED ORDER — SODIUM CHLORIDE 0.9 % IV SOLN: 100.0000 mL | INTRAVENOUS | Status: DC | PRN

## 2015-12-17 MED ORDER — DOXERCALCIFEROL 4 MCG/2ML IV SOLN
5.0000 ug | INTRAVENOUS | Status: DC
  Administered 2015-12-17: 5 ug via INTRAVENOUS
  Filled 2015-12-17 (×2): qty 4

## 2015-12-17 MED ORDER — TRAMADOL HCL 50 MG PO TABS
ORAL_TABLET | ORAL | Status: AC
  Administered 2015-12-17: 50 mg via ORAL
  Filled 2015-12-17: qty 1

## 2015-12-17 MED ORDER — EPOETIN ALFA 10000 UNIT/ML IJ SOLN
INTRAMUSCULAR | Status: AC
  Administered 2015-12-17: 10000 [IU] via INTRAVENOUS
  Filled 2015-12-17: qty 1

## 2015-12-17 NOTE — ED Notes (Signed)
Dialysis nurse paged.
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
## 2015-12-17 NOTE — Procedures (Signed)
   HEMODIALYSIS TREATMENT NOTE:  4.5 hour low-heparin dialysis completed via right upper arm AVF (15g ante/retrograde). Goal met: 4.1 liters removed without interruption in ultrafiltration. All blood was returned and hemostasis was achieved within 15 minutes. Pt stated she needed to catch her bus and would be leaving the hospital from the dialysis unit. She signed [place] waiver and left HD department ambulatory with steady gait.   Rockwell Alexandria, RN, CDN
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
## 2015-12-17 NOTE — ED Notes (Signed)
Pt up to wc with no difficulty. Pt has been sleeping. Nad. Pt transferred to dialyisis nurse

## 2015-12-17 NOTE — ED Notes (Signed)
Dilaysis nurse aware of pt. awaiitng dialysis nurse arrival. Pt sleeping. Nad.
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
## 2015-12-19 ENCOUNTER — Observation Stay (HOSPITAL_COMMUNITY)
Admission: EM | Admit: 2015-12-19 | Discharge: 2015-12-19 | Disposition: A | Discharge status: Home / Self Care | Payer: Medicare Other | Attending: Internal Medicine | Admitting: Internal Medicine

## 2015-12-19 ENCOUNTER — Encounter (HOSPITAL_COMMUNITY): Payer: Self-pay | Admitting: *Deleted

## 2015-12-19 DIAGNOSIS — F17210 Nicotine dependence, cigarettes, uncomplicated: Secondary | ICD-10-CM | POA: Insufficient documentation

## 2015-12-19 DIAGNOSIS — I5022 Chronic systolic (congestive) heart failure: Secondary | ICD-10-CM | POA: Insufficient documentation

## 2015-12-19 DIAGNOSIS — D631 Anemia in chronic kidney disease: Secondary | ICD-10-CM | POA: Diagnosis not present

## 2015-12-19 DIAGNOSIS — Z992 Dependence on renal dialysis: Secondary | ICD-10-CM | POA: Insufficient documentation

## 2015-12-19 DIAGNOSIS — I132 Hypertensive heart and chronic kidney disease with heart failure and with stage 5 chronic kidney disease, or end stage renal disease: Principal | ICD-10-CM | POA: Insufficient documentation

## 2015-12-19 DIAGNOSIS — N186 End stage renal disease: Secondary | ICD-10-CM

## 2015-12-19 DIAGNOSIS — Z79899 Other long term (current) drug therapy: Secondary | ICD-10-CM | POA: Diagnosis not present

## 2015-12-19 DIAGNOSIS — M3214 Glomerular disease in systemic lupus erythematosus: Secondary | ICD-10-CM | POA: Diagnosis present

## 2015-12-19 DIAGNOSIS — N250 Renal osteodystrophy: Secondary | ICD-10-CM | POA: Diagnosis not present

## 2015-12-19 DIAGNOSIS — I10 Essential (primary) hypertension: Secondary | ICD-10-CM | POA: Diagnosis not present

## 2015-12-19 DIAGNOSIS — I120 Hypertensive chronic kidney disease with stage 5 chronic kidney disease or end stage renal disease: Secondary | ICD-10-CM | POA: Diagnosis not present

## 2015-12-19 LAB — CBC
HEMATOCRIT: 23.9 % — AB (ref 36.0–46.0)
Hemoglobin: 7.6 g/dL — ABNORMAL LOW (ref 12.0–15.0)
MCH: 26.9 pg (ref 26.0–34.0)
MCHC: 31.8 g/dL (ref 30.0–36.0)
MCV: 84.5 fL (ref 78.0–100.0)
Platelets: 227 10*3/uL (ref 150–400)
RBC: 2.83 MIL/uL — ABNORMAL LOW (ref 3.87–5.11)
RDW: 16.9 % — AB (ref 11.5–15.5)
WBC: 6.2 10*3/uL (ref 4.0–10.5)

## 2015-12-19 LAB — CREATININE, SERUM
CREATININE: 9.18 mg/dL — AB (ref 0.44–1.00)
GFR calc Af Amer: 6 mL/min — ABNORMAL LOW (ref >60)
GFR, EST NON AFRICAN AMERICAN: 5 mL/min — AB (ref >60)

## 2015-12-19 MED ORDER — SEVELAMER CARBONATE 800 MG PO TABS
ORAL_TABLET | ORAL | Status: AC
  Administered 2015-12-19: 3200 mg via ORAL
  Filled 2015-12-19: qty 4

## 2015-12-19 MED ORDER — TRAMADOL HCL 50 MG PO TABS
50.0000 mg | ORAL_TABLET | Freq: Four times a day (QID) | ORAL | Status: DC | PRN
  Administered 2015-12-19: 50 mg via ORAL

## 2015-12-19 MED ORDER — HEPARIN SODIUM (PORCINE) 1000 UNIT/ML DIALYSIS: 1000.0000 [IU] | INTRAMUSCULAR | Status: DC | PRN

## 2015-12-19 MED ORDER — TRAMADOL HCL 50 MG PO TABS
ORAL_TABLET | ORAL | Status: AC
  Administered 2015-12-19: 50 mg via ORAL
  Filled 2015-12-19: qty 1

## 2015-12-19 MED ORDER — PENTAFLUOROPROP-TETRAFLUOROETH EX AERO: 1.0000 "application " | INHALATION_SPRAY | CUTANEOUS | Status: DC | PRN

## 2015-12-19 MED ORDER — EPOETIN ALFA 10000 UNIT/ML IJ SOLN
10000.0000 [IU] | INTRAMUSCULAR | Status: DC
  Administered 2015-12-19: 10000 [IU] via SUBCUTANEOUS

## 2015-12-19 MED ORDER — RISPERIDONE MICROSPHERES 25 MG IM SUSR: 25.0000 mg | INTRAMUSCULAR | Status: DC

## 2015-12-19 MED ORDER — DOXERCALCIFEROL 4 MCG/2ML IV SOLN
5.0000 ug | INTRAVENOUS | Status: DC
  Administered 2015-12-19: 5 ug via INTRAVENOUS

## 2015-12-19 MED ORDER — SODIUM CHLORIDE 0.9 % IV SOLN: 100.0000 mL | INTRAVENOUS | Status: DC | PRN

## 2015-12-19 MED ORDER — DIPHENHYDRAMINE HCL 25 MG PO CAPS
ORAL_CAPSULE | ORAL | Status: AC
  Administered 2015-12-19: 25 mg via ORAL
  Filled 2015-12-19: qty 1

## 2015-12-19 MED ORDER — EPOETIN ALFA 10000 UNIT/ML IJ SOLN
INTRAMUSCULAR | Status: AC
  Administered 2015-12-19: 10000 [IU] via SUBCUTANEOUS
  Filled 2015-12-19: qty 1

## 2015-12-19 MED ORDER — LIDOCAINE-PRILOCAINE 2.5-2.5 % EX CREA: 1.0000 "application " | TOPICAL_CREAM | CUTANEOUS | Status: DC | PRN

## 2015-12-19 MED ORDER — FUROSEMIDE 80 MG PO TABS: 120.0000 mg | ORAL_TABLET | Freq: Every day | ORAL | Status: DC

## 2015-12-19 MED ORDER — SODIUM CHLORIDE 0.9 % IV SOLN: 250.0000 mL | INTRAVENOUS | Status: DC | PRN

## 2015-12-19 MED ORDER — LIDOCAINE HCL (PF) 1 % IJ SOLN
INTRAMUSCULAR | Status: AC
  Administered 2015-12-19: 0.5 mL via INTRADERMAL
  Filled 2015-12-19: qty 5

## 2015-12-19 MED ORDER — SEVELAMER CARBONATE 800 MG PO TABS
3200.0000 mg | ORAL_TABLET | Freq: Three times a day (TID) | ORAL | Status: DC
  Administered 2015-12-19: 3200 mg via ORAL

## 2015-12-19 MED ORDER — METOPROLOL TARTRATE 50 MG PO TABS: 50.0000 mg | ORAL_TABLET | Freq: Two times a day (BID) | ORAL | Status: DC

## 2015-12-19 MED ORDER — DOXERCALCIFEROL 4 MCG/2ML IV SOLN
INTRAVENOUS | Status: AC
  Administered 2015-12-19: 5 ug via INTRAVENOUS
  Filled 2015-12-19: qty 4

## 2015-12-19 MED ORDER — SODIUM CHLORIDE 0.9% FLUSH: 3.0000 mL | Freq: Two times a day (BID) | INTRAVENOUS | Status: DC

## 2015-12-19 MED ORDER — HEPARIN SODIUM (PORCINE) 1000 UNIT/ML IJ SOLN
INTRAMUSCULAR | Status: AC
  Administered 2015-12-19: 1400 [IU] via INTRAVENOUS_CENTRAL
  Filled 2015-12-19: qty 5

## 2015-12-19 MED ORDER — DIPHENHYDRAMINE HCL 25 MG PO CAPS
25.0000 mg | ORAL_CAPSULE | Freq: Once | ORAL | Status: AC
  Administered 2015-12-19: 25 mg via ORAL

## 2015-12-19 MED ORDER — ALTEPLASE 2 MG IJ SOLR: 2.0000 mg | Freq: Once | INTRAMUSCULAR | Status: DC | PRN

## 2015-12-19 MED ORDER — LIDOCAINE HCL (PF) 1 % IJ SOLN
5.0000 mL | INTRAMUSCULAR | Status: DC | PRN
  Administered 2015-12-19: 0.5 mL via INTRADERMAL

## 2015-12-19 MED ORDER — SODIUM CHLORIDE 0.9% FLUSH: 3.0000 mL | INTRAVENOUS | Status: DC | PRN

## 2015-12-19 MED ORDER — PREDNISONE 20 MG PO TABS: 20.0000 mg | ORAL_TABLET | Freq: Every day | ORAL | Status: DC

## 2015-12-19 MED ORDER — ENOXAPARIN SODIUM 30 MG/0.3ML ~~LOC~~ SOLN: 30.0000 mg | SUBCUTANEOUS | Status: DC

## 2015-12-19 MED ORDER — ACETAMINOPHEN 500 MG PO TABS: 1000.0000 mg | ORAL_TABLET | Freq: Four times a day (QID) | ORAL | Status: DC | PRN

## 2015-12-19 MED ORDER — HEPARIN SODIUM (PORCINE) 1000 UNIT/ML DIALYSIS
20.0000 [IU]/kg | INTRAMUSCULAR | Status: DC | PRN
  Administered 2015-12-19: 1400 [IU] via INTRAVENOUS_CENTRAL

## 2015-12-19 NOTE — Procedures (Signed)
   HEMODIALYSIS TREATMENT NOTE:  3.5 hour low-heparin dialysis completed via right upper arm AVF (15g ante/retrograde). Goal nearly met: 2.3 liters removed (of 2.5L goal).  Pt asked to terminate session 30 minutes early d/t discomfort in access arm.  VP normal, no swelling or tenderness over cannulation sites.  All blood was returned and hemostasis was achieved within 15 minutes. Renal diet/sodium and fluid restriction were encouraged.  Pt was transported back to 303.  She indicated she intended to leave hospital and signed [facility] waiver.  She left department 3A ambulatory with steady gait.  Dr. Theador Hawthorne was notified of early termination of HD session.  Rockwell Alexandria, RN, CDN

## 2015-12-19 NOTE — Consult Note (Signed)
Sara [name] MRN: [id number] DOB/AGE: 1980/07/31 35 y.o. Primary Care Physician:Pcp Not In System Admit date: 12/19/2015 Chief Complaint:  Chief Complaint  Patient presents with  . Dialysis   HPI: Pt is 35 year old female with past medical hx of ESRD who was admitted with c/o dyspnea," I need dialysis "  HPI dates 1-2 days ago started feeling bad pt came to ER asking for her dialysis. Pt says " I need my dialysis . I had my dialysis this last Wednesday on  12/17/15 ". No c/o fever/cough/chills NO c/o nausea/vomiting. NO c/o abdominal pain. No c/o hematuria      Past Medical History:  Diagnosis Date  . Acute myopericarditis    hx/notes 10/09/2014  . Bipolar disorder ([place])    [name] 10/09/2014  . CHF (congestive heart failure) ([place])    systolic/notes 123XX123  . Chronic anemia    [name] 10/09/2014  . ESRD (end stage renal disease) on dialysis [facility])    "[name]; [facility]" (10/09/2014)  . History of blood transfusion    "this is probably my 3rd" (10/09/2014)  . Hypertension   . Low back pain   . Lupus ([place])    lupus w nephritis  . 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 Non-compliant patient   . Positive ANA (antinuclear antibody) 08/16/2012  . 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 Pregnancy   . Psychosis   . Schizoaffective disorder, bipolar type ([place]) 11/20/2014  . Schizophrenia ([place])    [name] 10/09/2014  . Tobacco abuse 02/20/2014        Family History  Problem Relation Age of Onset  . Drug abuse Father   . Kidney disease Father     Social History:  reports that she has been smoking Cigarettes.  She has a 2.00 pack-year smoking history. She has never used smokeless tobacco. She reports that she does not drink alcohol or use drugs.   Allergies:  Allergies  Allergen Reactions  . Ativan [Lorazepam] Swelling and Other (See Comments)    Dysarthria(patient has difficulty speaking and slurred speech); denies swelling, itching, pain, or numbness.  [name] [Ziprasidone Hcl] Itching  and Swelling    Tongue swelling  . Keflex [Cephalexin] Swelling and Other (See Comments)    Tongue swelling. Can't talk   . Haldol [Haloperidol Lactate] Swelling    Tongue swelling. 05/31/15 - MD ok with giving as pt has tolerated in the past Pt can take benadryl.  . Other Itching    wool     (Not in a hospital admission)     [id number] from the symptoms mentioned above,there are no other symptoms referable to all systems reviewed.      Physical Exam: Vital signs in last 24 hours: Temp:  [98.1 F (36.7 C)] 98.1 F (36.7 C) (09/13 0729) Pulse Rate:  [107] 107 (09/13 0729) Resp:  [16] 16 (09/13 0729) BP: (130)/(99) 130/99 (09/13 0729) SpO2:  [99 %] 99 % (09/13 0729) Weight:  [150 lb (68 kg)] 150 lb (68 kg) (09/13 0729) Weight change:     Intake/Output from previous day: No intake/output data recorded. No intake/output data recorded.   Physical Exam: General- pt is awake,alert, follows comands Resp- No acute REsp distress, chest clear to asuculatation CVS- S1S2 regular in rate and rhythm, NO rubs  GIT- BS+, soft, NT, ND EXT- No LE Edema, NO Cyanosis CNS- CN 2-12 grossly intact. Moving all 4 extremities Access- AVF+    Lab Results:  CBC    Component Value Date/Time  WBC 6.1 12/17/2015 1429   RBC 2.83 (L) 12/17/2015 1429   HGB 7.7 (L) 12/17/2015 1429   HCT 24.0 (L) 12/17/2015 1429   PLT 231 12/17/2015 1429   MCV 84.8 12/17/2015 1429   MCH 27.2 12/17/2015 1429   MCHC 32.1 12/17/2015 1429   RDW 17.2 (H) 12/17/2015 1429   LYMPHSABS 1.0 09/21/2015 0841   MONOABS 0.7 09/21/2015 0841   EOSABS 0.1 09/21/2015 0841   BASOSABS 0.0 09/21/2015 0841      BMET BMP Latest Ref Rng & Units 12/17/2015 12/17/2015 12/14/2015  Glucose 65 - 99 mg/dL 67 85 75  BUN 6 - 20 mg/dL 56(H) 50(H) 52(H)  Creatinine 0.44 - 1.00 mg/dL 11.01(H) 9.90(H) 9.05(H)  Sodium 135 - 145 mmol/L 135 136 132(L)  Potassium 3.5 - 5.1 mmol/L 4.3 4.5 4.7  Chloride 101 - 111 mmol/L 96(L) 99(L)  99(L)  CO2 22 - 32 mmol/L 24 - 26  Calcium 8.9 - 10.3 mg/dL 9.2 - 8.8(L)         Lab Results  Component Value Date   PTH 1,100 (H) 11/19/2015   PTH Comment 11/19/2015   CALCIUM 9.2 12/17/2015   CAION 1.18 12/17/2015   PHOS 7.0 (H) 12/17/2015      Impression: 1)Renal  ESRD on HD                Pt is not on regular  Schedule sec to her complaince/adherence issues                Will dialyze pt today  2)HTN Bp is not at goal  3)Anemia In ESRD the goal for HGb is 9--11. 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   Not at goal sec to non adherence to hd tx Phosphorus not at goal.  on binders  5)Psych .  Hx of   psycosis Schizophrenia Primary MD following  6)Electrolytes  Normokalemic   Normonatremic   7)Acid base Co2  at goal    Plan:  Will dialyze today Will use 2 k bath Will keep on epo Will try to take 2.5-3 liters off if possible   [name] S 12/19/2015, 9:05 AM
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
## 2015-12-19 NOTE — ED Notes (Signed)
Pt denies any symptoms at this time
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
## 2015-12-19 NOTE — ED Triage Notes (Signed)
Comes in here dialysis.

## 2015-12-19 NOTE — ED Notes (Signed)
Pt was given ice water and blankets.

## 2015-12-19 NOTE — Discharge Summary (Signed)
Physician Discharge Summary  Sara [name] [id number] DOB: June 04, 1980 DOA: 12/19/2015  PCP: Pcp Not In System  Admit date: 12/19/2015 Discharge date: 12/19/2015  Time spent: 10 minutes  Recommendations for Outpatient Follow-up:  1. After undergoing hemodialysis Sara [name] left hospital before I could see her    Discharge Diagnoses:  Active Problems:   Lupus nephritis ([place])   Admission for dialysis [name])   Chronic systolic CHF (congestive heart failure) (H[facility])   ESRD (end stage renal disease) ([place])   End stage renal disease ([place])   Discharge Condition:   Diet recommendation:   Filed Weights   12/19/15 0729 12/19/15 1140  Weight: 68 kg (150 lb) 68.2 kg (150 lb 5.7 oz)    History of present illness:  Sara [name] is a 35 y.o. female with medical history significant of end-stage renal disease, AV fistula placement on right upper extremity, lupus nephritis, on hemodialysis Mondays Wednesdays and Fridays who is currently receiving her hemodialysis at this facility. She has no complaints, denies fevers, chills, shortness of breath, swelling, nausea, vomiting. She last went hemodialysis on 12/17/2015.   Hospital Course:  Sara [name] Is a 35 year old female having history of end-stage renal disease, currently receiving her hemodialysis at this facility as there have been issues with outpatient hemodialysis. She is currently on the Monday Wednesday and Friday dialysis schedule, having her last dialysis session on 12/17/2015. She presented to the emergency department on 12/19/2015 for her Wednesday dialysis session. She was taken for hemodialysis with 2.3 L removed. It appears she asked to terminate the session 30 minutes early, left hospital [place].  Procedures:  Hemodialysis  Consultations:  Nephrology  Discharge Exam: Vitals:   12/19/15 1515 12/19/15 1530  BP: (!) 149/94 (!) 154/91  Pulse: 100 (!) 102  Resp:  16  Temp:  98 F (36.7 C)    Discharge  Instructions    Current Discharge Medication List    CONTINUE these medications which have NOT CHANGED   Details  acetaminophen (TYLENOL) 500 MG tablet Take 1,000 mg by mouth every 6 (six) hours as needed for moderate pain. Reported on 06/12/2015    furosemide (LASIX) 40 MG tablet Take 3 tablets (120 mg total) by mouth daily. Qty: 90 tablet, Refills: 0    metoprolol (LOPRESSOR) 50 MG tablet Take 1 tablet (50 mg total) by mouth 2 (two) times daily. Qty: 60 tablet, Refills: 0    predniSONE (DELTASONE) 20 MG tablet Take 1 tablet (20 mg total) by mouth daily with breakfast. Qty: 30 tablet, Refills: 6    risperiDONE microspheres (RISPERDAL CONSTA) 25 MG injection Inject 25 mg into the muscle every 14 (fourteen) days.    sevelamer carbonate (RENVELA) 800 MG tablet Take 4 tablets (3,200 mg total) by mouth 3 (three) times daily with meals. Qty: 360 tablet, Refills: 6    traMADol (ULTRAM) 50 MG tablet Take 1 tablet (50 mg total) by mouth every 6 (six) hours as needed for moderate pain. Qty: 30 tablet, Refills: 0       Allergies  Allergen Reactions  . Ativan [Lorazepam] Swelling and Other (See Comments)    Dysarthria(patient has difficulty speaking and slurred speech); denies swelling, itching, pain, or numbness.  [name] [Ziprasidone Hcl] Itching and Swelling    Tongue swelling  . Keflex [Cephalexin] Swelling and Other (See Comments)    Tongue swelling. Can't talk   . Haldol [Haloperidol Lactate] Swelling    Tongue swelling. 05/31/15 - MD ok with giving as pt has  tolerated in the past Pt can take benadryl.  . Other Itching    wool      The results of significant diagnostics from this hospitalization (including imaging, microbiology, ancillary and laboratory) are listed below for reference.    Significant Diagnostic Studies: No results found.  Microbiology: No results found for this or any previous visit (from the past 240 hour(s)).   Labs: Basic Metabolic Panel:  Recent  Labs Lab 12/14/15 1258 12/17/15 0845 12/17/15 1429 12/19/15 1147  NA 132* 136 135  --   K 4.7 4.5 4.3  --   CL 99* 99* 96*  --   CO2 26  --  24  --   GLUCOSE 75 85 67  --   BUN 52* 50* 56*  --   CREATININE 9.05* 9.90* 11.01* 9.18*  CALCIUM 8.8*  --  9.2  --   PHOS 6.9*  --  7.0*  --    Liver Function Tests:  Recent Labs Lab 12/14/15 1258 12/17/15 1429  ALBUMIN 3.2* 3.3*   No results for input(s): LIPASE, AMYLASE in the last 168 hours. No results for input(s): AMMONIA in the last 168 hours. CBC:  Recent Labs Lab 12/14/15 1258 12/17/15 0845 12/17/15 1429 12/19/15 1147  WBC 6.5  --  6.1 6.2  HGB 8.0* 9.9* 7.7* 7.6*  HCT 25.1* 29.0* 24.0* 23.9*  MCV 84.5  --  84.8 84.5  PLT PLATELET CLUMPS NOTED ON SMEAR, COUNT APPEARS ADEQUATE  --  231 227   Cardiac Enzymes: No results for input(s): CKTOTAL, CKMB, CKMBINDEX, TROPONINI in the last 168 hours. BNP: BNP (last 3 results) No results for input(s): BNP in the last 8760 hours.  ProBNP (last 3 results) No results for input(s): PROBNP in the last 8760 hours.  CBG: No results for input(s): GLUCAP in the last 168 hours.     Signed:  Kelvin Cellar MD.  [facility] 12/19/2015, 3:57 PM

## 2015-12-21 ENCOUNTER — Encounter (HOSPITAL_COMMUNITY): Payer: Self-pay | Admitting: Emergency Medicine

## 2015-12-21 ENCOUNTER — Observation Stay (HOSPITAL_COMMUNITY)
Admission: EM | Admit: 2015-12-21 | Discharge: 2015-12-21 | Disposition: A | Discharge status: Home / Self Care | Payer: Medicare Other | Attending: Internal Medicine | Admitting: Internal Medicine

## 2015-12-21 DIAGNOSIS — Z7952 Long term (current) use of systemic steroids: Secondary | ICD-10-CM

## 2015-12-21 DIAGNOSIS — F17210 Nicotine dependence, cigarettes, uncomplicated: Secondary | ICD-10-CM | POA: Diagnosis not present

## 2015-12-21 DIAGNOSIS — D631 Anemia in chronic kidney disease: Secondary | ICD-10-CM | POA: Diagnosis not present

## 2015-12-21 DIAGNOSIS — M329 Systemic lupus erythematosus, unspecified: Secondary | ICD-10-CM | POA: Diagnosis present

## 2015-12-21 DIAGNOSIS — Z992 Dependence on renal dialysis: Secondary | ICD-10-CM | POA: Diagnosis not present

## 2015-12-21 DIAGNOSIS — I132 Hypertensive heart and chronic kidney disease with heart failure and with stage 5 chronic kidney disease, or end stage renal disease: Principal | ICD-10-CM | POA: Insufficient documentation

## 2015-12-21 DIAGNOSIS — I10 Essential (primary) hypertension: Secondary | ICD-10-CM | POA: Diagnosis present

## 2015-12-21 DIAGNOSIS — E8770 Fluid overload, unspecified: Secondary | ICD-10-CM | POA: Diagnosis present

## 2015-12-21 DIAGNOSIS — F319 Bipolar disorder, unspecified: Secondary | ICD-10-CM | POA: Diagnosis not present

## 2015-12-21 DIAGNOSIS — Z720 Tobacco use: Secondary | ICD-10-CM | POA: Diagnosis present

## 2015-12-21 DIAGNOSIS — N186 End stage renal disease: Secondary | ICD-10-CM | POA: Diagnosis present

## 2015-12-21 DIAGNOSIS — I120 Hypertensive chronic kidney disease with stage 5 chronic kidney disease or end stage renal disease: Secondary | ICD-10-CM | POA: Diagnosis not present

## 2015-12-21 DIAGNOSIS — I5022 Chronic systolic (congestive) heart failure: Secondary | ICD-10-CM | POA: Diagnosis not present

## 2015-12-21 DIAGNOSIS — N250 Renal osteodystrophy: Secondary | ICD-10-CM | POA: Diagnosis not present

## 2015-12-21 HISTORY — DX: Long term (current) use of systemic steroids: Z79.52

## 2015-12-21 LAB — CBC WITH DIFFERENTIAL/PLATELET
BASOS PCT: 1 %
Basophils Absolute: 0 10*3/uL (ref 0.0–0.1)
EOS ABS: 0.3 10*3/uL (ref 0.0–0.7)
Eosinophils Relative: 4 %
HEMATOCRIT: 26.1 % — AB (ref 36.0–46.0)
HEMOGLOBIN: 8.4 g/dL — AB (ref 12.0–15.0)
LYMPHS ABS: 2.2 10*3/uL (ref 0.7–4.0)
Lymphocytes Relative: 32 %
MCH: 27.3 pg (ref 26.0–34.0)
MCHC: 32.2 g/dL (ref 30.0–36.0)
MCV: 84.7 fL (ref 78.0–100.0)
MONOS PCT: 10 %
Monocytes Absolute: 0.7 10*3/uL (ref 0.1–1.0)
NEUTROS ABS: 3.7 10*3/uL (ref 1.7–7.7)
NEUTROS PCT: 53 %
Platelets: 261 10*3/uL (ref 150–400)
RBC: 3.08 MIL/uL — AB (ref 3.87–5.11)
RDW: 17.1 % — ABNORMAL HIGH (ref 11.5–15.5)
WBC: 6.9 10*3/uL (ref 4.0–10.5)

## 2015-12-21 LAB — BASIC METABOLIC PANEL
Anion gap: 12 (ref 5–15)
BUN: 45 mg/dL — ABNORMAL HIGH (ref 6–20)
CHLORIDE: 97 mmol/L — AB (ref 101–111)
CO2: 29 mmol/L (ref 22–32)
Calcium: 9.5 mg/dL (ref 8.9–10.3)
Creatinine, Ser: 9.16 mg/dL — ABNORMAL HIGH (ref 0.44–1.00)
GFR calc non Af Amer: 5 mL/min — ABNORMAL LOW (ref >60)
GFR, EST AFRICAN AMERICAN: 6 mL/min — AB (ref >60)
Glucose, Bld: 88 mg/dL (ref 65–99)
POTASSIUM: 3.7 mmol/L (ref 3.5–5.1)
SODIUM: 138 mmol/L (ref 135–145)

## 2015-12-21 LAB — TYPE AND SCREEN
ABO/RH(D): O POS
ANTIBODY SCREEN: POSITIVE
DAT, IGG: NEGATIVE

## 2015-12-21 MED ORDER — ACETAMINOPHEN 650 MG RE SUPP: 650.0000 mg | Freq: Four times a day (QID) | RECTAL | Status: DC | PRN

## 2015-12-21 MED ORDER — SEVELAMER CARBONATE 800 MG PO TABS
ORAL_TABLET | ORAL | Status: AC
  Filled 2015-12-21: qty 4

## 2015-12-21 MED ORDER — DIPHENHYDRAMINE HCL 25 MG PO CAPS
25.0000 mg | ORAL_CAPSULE | Freq: Four times a day (QID) | ORAL | Status: DC | PRN
  Administered 2015-12-21: 25 mg via ORAL

## 2015-12-21 MED ORDER — TRAMADOL HCL 50 MG PO TABS
ORAL_TABLET | ORAL | Status: AC
  Administered 2015-12-21: 50 mg via ORAL
  Filled 2015-12-21: qty 1

## 2015-12-21 MED ORDER — PREDNISONE 20 MG PO TABS: 20.0000 mg | ORAL_TABLET | Freq: Every day | ORAL | Status: DC

## 2015-12-21 MED ORDER — PENTAFLUOROPROP-TETRAFLUOROETH EX AERO: 1.0000 "application " | INHALATION_SPRAY | CUTANEOUS | Status: DC | PRN

## 2015-12-21 MED ORDER — DOXERCALCIFEROL 4 MCG/2ML IV SOLN
INTRAVENOUS | Status: AC
  Administered 2015-12-21: 5 ug via INTRAVENOUS
  Filled 2015-12-21: qty 4

## 2015-12-21 MED ORDER — LIDOCAINE HCL (PF) 1 % IJ SOLN
INTRAMUSCULAR | Status: AC
  Administered 2015-12-21: 0.5 mL via INTRADERMAL
  Filled 2015-12-21: qty 5

## 2015-12-21 MED ORDER — EPOETIN ALFA 20000 UNIT/ML IJ SOLN
INTRAMUSCULAR | Status: AC
  Administered 2015-12-21: 14000 [IU] via INTRAVENOUS
  Filled 2015-12-21: qty 1

## 2015-12-21 MED ORDER — HEPARIN SODIUM (PORCINE) 1000 UNIT/ML DIALYSIS
20.0000 [IU]/kg | INTRAMUSCULAR | Status: DC | PRN
  Administered 2015-12-21: 1400 [IU] via INTRAVENOUS_CENTRAL

## 2015-12-21 MED ORDER — SEVELAMER CARBONATE 800 MG PO TABS
ORAL_TABLET | ORAL | Status: AC
  Administered 2015-12-21: 3200 mg via ORAL
  Filled 2015-12-21: qty 4

## 2015-12-21 MED ORDER — ONDANSETRON HCL 4 MG PO TABS: 4.0000 mg | ORAL_TABLET | Freq: Four times a day (QID) | ORAL | Status: DC | PRN

## 2015-12-21 MED ORDER — DOXERCALCIFEROL 4 MCG/2ML IV SOLN
5.0000 ug | INTRAVENOUS | Status: DC
  Administered 2015-12-21: 5 ug via INTRAVENOUS

## 2015-12-21 MED ORDER — TRAMADOL HCL 50 MG PO TABS: 50.0000 mg | ORAL_TABLET | Freq: Four times a day (QID) | ORAL | 0 refills | Status: AC | PRN

## 2015-12-21 MED ORDER — LIDOCAINE HCL (PF) 1 % IJ SOLN
5.0000 mL | INTRAMUSCULAR | Status: DC | PRN
  Administered 2015-12-21: 0.5 mL via INTRADERMAL

## 2015-12-21 MED ORDER — ACETAMINOPHEN 325 MG PO TABS: 650.0000 mg | ORAL_TABLET | Freq: Four times a day (QID) | ORAL | Status: DC | PRN

## 2015-12-21 MED ORDER — TRAMADOL HCL 50 MG PO TABS
50.0000 mg | ORAL_TABLET | Freq: Four times a day (QID) | ORAL | Status: DC | PRN
  Administered 2015-12-21: 50 mg via ORAL

## 2015-12-21 MED ORDER — RISPERIDONE MICROSPHERES 25 MG IM SUSR: 25.0000 mg | INTRAMUSCULAR | Status: DC

## 2015-12-21 MED ORDER — DIPHENHYDRAMINE HCL 25 MG PO CAPS
ORAL_CAPSULE | ORAL | Status: AC
  Administered 2015-12-21: 25 mg via ORAL
  Filled 2015-12-21: qty 1

## 2015-12-21 MED ORDER — SEVELAMER CARBONATE 800 MG PO TABS
3200.0000 mg | ORAL_TABLET | Freq: Three times a day (TID) | ORAL | Status: DC
  Administered 2015-12-21 (×2): 3200 mg via ORAL

## 2015-12-21 MED ORDER — SODIUM CHLORIDE 0.9 % IV SOLN: 100.0000 mL | INTRAVENOUS | Status: DC | PRN

## 2015-12-21 MED ORDER — LIDOCAINE-PRILOCAINE 2.5-2.5 % EX CREA: 1.0000 "application " | TOPICAL_CREAM | CUTANEOUS | Status: DC | PRN

## 2015-12-21 MED ORDER — ONDANSETRON HCL 4 MG/2ML IJ SOLN: 4.0000 mg | Freq: Four times a day (QID) | INTRAMUSCULAR | Status: DC | PRN

## 2015-12-21 MED ORDER — HEPARIN SODIUM (PORCINE) 5000 UNIT/ML IJ SOLN: 5000.0000 [IU] | Freq: Three times a day (TID) | INTRAMUSCULAR | Status: DC

## 2015-12-21 MED ORDER — SODIUM CHLORIDE 0.9 % IV SOLN: Freq: Once | INTRAVENOUS | Status: DC

## 2015-12-21 MED ORDER — METOPROLOL TARTRATE 50 MG PO TABS: 50.0000 mg | ORAL_TABLET | Freq: Two times a day (BID) | ORAL | Status: DC

## 2015-12-21 MED ORDER — ALBUTEROL SULFATE (2.5 MG/3ML) 0.083% IN NEBU: 2.5000 mg | INHALATION_SOLUTION | RESPIRATORY_TRACT | Status: DC | PRN

## 2015-12-21 MED ORDER — EPOETIN ALFA 20000 UNIT/ML IJ SOLN
14000.0000 [IU] | INTRAMUSCULAR | Status: DC
  Administered 2015-12-21: 14000 [IU] via INTRAVENOUS

## 2015-12-21 MED ORDER — HEPARIN SODIUM (PORCINE) 1000 UNIT/ML IJ SOLN
INTRAMUSCULAR | Status: AC
  Administered 2015-12-21: 1400 [IU] via INTRAVENOUS_CENTRAL
  Filled 2015-12-21: qty 4
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
## 2015-12-21 NOTE — Procedures (Addendum)
   HEMODIALYSIS TREATMENT NOTE:  4.5 hour heparin-free dialysis completed via right upper arm AVF (15g ante/retrograde). Goal met: 4 liters removed without interruption in ultrafiltration.  All blood was returned and hemostasis was achieved within 15 minutes. Hemodynamically stable with post-HD BP 139/78, pulse 90.  Will wait to see Dr. Caryn Section then discharge hospital from HD unit.  Rockwell Alexandria, RN, CDN    1700:  Pt was seen post-HD by Dr. Caryn Section.  Tramadol prescription was provided to pt.  Discharged from HD unit ambulatory with steady gait.

## 2015-12-21 NOTE — ED Triage Notes (Signed)
Pt here for dialysis. Last dialyzed wed 12/19/15.
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
## 2015-12-21 NOTE — ED Notes (Signed)
Pt sleeping. 

## 2015-12-21 NOTE — Discharge Summary (Signed)
Physician Discharge Summary  Sara [name] [name] DOB: 1981-03-17 DOA: 12/21/2015  PCP: Pcp Not In System  Admit date: 12/21/2015 Discharge date: 12/21/2015  Time spent: 30 minutes  Recommendations for Outpatient Follow-up:  1. Recommend follow-up of the patient's CBC, particularly her hemoglobin/hematocrit.  2. Recommend ordering an anemia panel at her next presentation for HD.    Discharge Diagnoses:  Principal Problem:   ESRD on hemodialysis ([place]) Active Problems:   Anemia in ESRD (end-stage renal disease) ([place])   Tobacco abuse   Essential hypertension   Systemic lupus (HCC)   ESRD needing dialysis ([place])   Volume overload   Current use of steroid medication   Discharge Condition: Improved.  Diet recommendation: heart healthy.  Filed Weights   12/21/15 0809 12/21/15 1135  Weight: 68 kg (150 lb) 68 kg (149 lb 14.6 oz)    History of present illness:  Sara [name] is a 35 y.o. female with medical history significant for end-stage renal disease on hemodialysis hypertension, and schizoaffective disorder/bipolar disorder, who is well-known to the [facility] system. She has been barred from all other outpatient hemodialysis centers due to her behavior in the past, although her behavior has not been inappropriate during any of the previous hospitalizations at [facility]. She presented to the ED for routine hemodialysis. She denied shortness of breath. She denied leg swelling. She denied black tarry stools or bright red blood per rectum. She denied hematemesis. She no longer has a menstrual period.  In the ED, she was afebrile and hemodynamically stable. Her lab data were significant for a hemoglobin of 8.4, BUN of 45, and creatinine of 9.16. She was admitted for hemodialysis and management of her other chronic conditions.  Hospital Course:  Nephrology was consulted for hemodialysis. She was started on supportive treatment. Her chronic medications were  continued. She underwent hemodialysis successfully. Nephrology gave her a dose of Epogen for end-stage renal disease associated anemia. She denied black tarry stools or bright red blood per rectum. She remained hemodynamically stable. At her next presentation, would recommend ordering an anemia panel for further evaluation.  Procedures:  Hemodialysis   Consultations:  Nephrology  Discharge Exam: Vitals:   12/21/15 1615 12/21/15 1630  BP: 144/97 139/84  Pulse: 96 95  Resp:  16  Temp:  97.7 F (36.5 C)    General: No acute distress. Cardiovascular: S1, S2, with 1 to 2/6 systolic murmur. Respiratory: clear to auscultation bilaterally. Breathing was nonlabored. Abdomen: Positive bowel sounds, soft, nontender, nondistended. Extremities: Trace pedal edema.  Discharge Instructions   Discharge Instructions    Diet - low sodium heart healthy    Complete by:  As directed    Increase activity slowly    Complete by:  As directed      Current Discharge Medication List    CONTINUE these medications which have CHANGED   Details  traMADol (ULTRAM) 50 MG tablet Take 1 tablet (50 mg total) by mouth every 6 (six) hours as needed for moderate pain. Qty: 30 tablet, Refills: 0      CONTINUE these medications which have NOT CHANGED   Details  acetaminophen (TYLENOL) 500 MG tablet Take 1,000 mg by mouth every 6 (six) hours as needed for moderate pain. Reported on 06/12/2015    furosemide (LASIX) 40 MG tablet Take 3 tablets (120 mg total) by mouth daily. Qty: 90 tablet, Refills: 0    metoprolol (LOPRESSOR) 50 MG tablet Take 1 tablet (50 mg total) by mouth 2 (two) times  daily. Qty: 60 tablet, Refills: 0    predniSONE (DELTASONE) 20 MG tablet Take 1 tablet (20 mg total) by mouth daily with breakfast. Qty: 30 tablet, Refills: 6    risperiDONE microspheres (RISPERDAL CONSTA) 25 MG injection Inject 25 mg into the muscle every 14 (fourteen) days.    sevelamer carbonate (RENVELA) 800 MG  tablet Take 4 tablets (3,200 mg total) by mouth 3 (three) times daily with meals. Qty: 360 tablet, Refills: 6       Allergies  Allergen Reactions  . Ativan [Lorazepam] Swelling and Other (See Comments)    Dysarthria(patient has difficulty speaking and slurred speech); denies swelling, itching, pain, or numbness.  [name] [Ziprasidone Hcl] Itching and Swelling    Tongue swelling  . Keflex [Cephalexin] Swelling and Other (See Comments)    Tongue swelling. Can't talk   . Haldol [Haloperidol Lactate] Swelling    Tongue swelling. 05/31/15 - MD ok with giving as pt has tolerated in the past Pt can take benadryl.  . Other Itching    wool      The results of significant diagnostics from this hospitalization (including imaging, microbiology, ancillary and laboratory) are listed below for reference.    Significant Diagnostic Studies: No results found.  Microbiology: No results found for this or any previous visit (from the past 240 hour(s)).   Labs: Basic Metabolic Panel:  Recent Labs Lab 12/17/15 0845 12/17/15 1429 12/19/15 1147 12/21/15 0928  NA 136 135  --  138  K 4.5 4.3  --  3.7  CL 99* 96*  --  97*  CO2  --  24  --  29  GLUCOSE 85 67  --  88  BUN 50* 56*  --  45*  CREATININE 9.90* 11.01* 9.18* 9.16*  CALCIUM  --  9.2  --  9.5  PHOS  --  7.0*  --   --    Liver Function Tests:  Recent Labs Lab 12/17/15 1429  ALBUMIN 3.3*   No results for input(s): LIPASE, AMYLASE in the last 168 hours. No results for input(s): AMMONIA in the last 168 hours. CBC:  Recent Labs Lab 12/17/15 0845 12/17/15 1429 12/19/15 1147 12/21/15 0928  WBC  --  6.1 6.2 6.9  NEUTROABS  --   --   --  3.7  HGB 9.9* 7.7* 7.6* 8.4*  HCT 29.0* 24.0* 23.9* 26.1*  MCV  --  84.8 84.5 84.7  PLT  --  231 227 261   Cardiac Enzymes: No results for input(s): CKTOTAL, CKMB, CKMBINDEX, TROPONINI in the last 168 hours. BNP: BNP (last 3 results) No results for input(s): BNP in the last 8760  hours.  ProBNP (last 3 results) No results for input(s): PROBNP in the last 8760 hours.  CBG: No results for input(s): GLUCAP in the last 168 hours.     Signed:  Levon Penning MD.  [facility] 12/21/2015, 4:51 PM

## 2015-12-24 ENCOUNTER — Observation Stay (HOSPITAL_COMMUNITY)
Admission: EM | Admit: 2015-12-24 | Discharge: 2015-12-24 | Disposition: A | Discharge status: Home / Self Care | Payer: Medicare Other | Attending: Internal Medicine | Admitting: Internal Medicine

## 2015-12-24 ENCOUNTER — Encounter (HOSPITAL_COMMUNITY): Payer: Self-pay | Admitting: Emergency Medicine

## 2015-12-24 DIAGNOSIS — F17210 Nicotine dependence, cigarettes, uncomplicated: Secondary | ICD-10-CM | POA: Diagnosis not present

## 2015-12-24 DIAGNOSIS — N250 Renal osteodystrophy: Secondary | ICD-10-CM | POA: Diagnosis not present

## 2015-12-24 DIAGNOSIS — I132 Hypertensive heart and chronic kidney disease with heart failure and with stage 5 chronic kidney disease, or end stage renal disease: Secondary | ICD-10-CM | POA: Diagnosis not present

## 2015-12-24 DIAGNOSIS — N186 End stage renal disease: Secondary | ICD-10-CM | POA: Diagnosis not present

## 2015-12-24 DIAGNOSIS — D631 Anemia in chronic kidney disease: Secondary | ICD-10-CM | POA: Diagnosis not present

## 2015-12-24 DIAGNOSIS — Z4901 Encounter for fitting and adjustment of extracorporeal dialysis catheter: Secondary | ICD-10-CM | POA: Diagnosis not present

## 2015-12-24 DIAGNOSIS — Z992 Dependence on renal dialysis: Secondary | ICD-10-CM | POA: Diagnosis not present

## 2015-12-24 DIAGNOSIS — I120 Hypertensive chronic kidney disease with stage 5 chronic kidney disease or end stage renal disease: Secondary | ICD-10-CM | POA: Diagnosis not present

## 2015-12-24 DIAGNOSIS — I5020 Unspecified systolic (congestive) heart failure: Secondary | ICD-10-CM | POA: Insufficient documentation

## 2015-12-24 DIAGNOSIS — IMO0002 Reserved for concepts with insufficient information to code with codable children: Secondary | ICD-10-CM

## 2015-12-24 LAB — CBC
HEMATOCRIT: 24.3 % — AB (ref 36.0–46.0)
HEMOGLOBIN: 7.8 g/dL — AB (ref 12.0–15.0)
MCH: 27.2 pg (ref 26.0–34.0)
MCHC: 32.1 g/dL (ref 30.0–36.0)
MCV: 84.7 fL (ref 78.0–100.0)
Platelets: 254 10*3/uL (ref 150–400)
RBC: 2.87 MIL/uL — AB (ref 3.87–5.11)
RDW: 17.4 % — ABNORMAL HIGH (ref 11.5–15.5)
WBC: 5.8 10*3/uL (ref 4.0–10.5)

## 2015-12-24 LAB — RENAL FUNCTION PANEL
ALBUMIN: 3.3 g/dL — AB (ref 3.5–5.0)
ANION GAP: 12 (ref 5–15)
BUN: 55 mg/dL — ABNORMAL HIGH (ref 6–20)
CO2: 25 mmol/L (ref 22–32)
Calcium: 9.5 mg/dL (ref 8.9–10.3)
Chloride: 97 mmol/L — ABNORMAL LOW (ref 101–111)
Creatinine, Ser: 11.01 mg/dL — ABNORMAL HIGH (ref 0.44–1.00)
GFR calc Af Amer: 5 mL/min — ABNORMAL LOW (ref >60)
GFR, EST NON AFRICAN AMERICAN: 4 mL/min — AB (ref >60)
GLUCOSE: 84 mg/dL (ref 65–99)
PHOSPHORUS: 7.2 mg/dL — AB (ref 2.5–4.6)
POTASSIUM: 4.3 mmol/L (ref 3.5–5.1)
Sodium: 134 mmol/L — ABNORMAL LOW (ref 135–145)

## 2015-12-24 MED ORDER — PENTAFLUOROPROP-TETRAFLUOROETH EX AERO: 1.0000 "application " | INHALATION_SPRAY | CUTANEOUS | Status: DC | PRN

## 2015-12-24 MED ORDER — HEPARIN SODIUM (PORCINE) 1000 UNIT/ML DIALYSIS
20.0000 [IU]/kg | INTRAMUSCULAR | Status: DC | PRN
  Administered 2015-12-24: 1400 [IU] via INTRAVENOUS_CENTRAL

## 2015-12-24 MED ORDER — SEVELAMER CARBONATE 800 MG PO TABS
3200.0000 mg | ORAL_TABLET | Freq: Three times a day (TID) | ORAL | Status: DC
  Administered 2015-12-24: 3200 mg via ORAL

## 2015-12-24 MED ORDER — FOLIC ACID 1 MG PO TABS: 1.0000 mg | ORAL_TABLET | Freq: Every day | ORAL | 0 refills | Status: DC

## 2015-12-24 MED ORDER — PREDNISONE 20 MG PO TABS: 20.0000 mg | ORAL_TABLET | Freq: Every day | ORAL | Status: DC

## 2015-12-24 MED ORDER — ACETAMINOPHEN 650 MG RE SUPP: 650.0000 mg | Freq: Four times a day (QID) | RECTAL | Status: DC | PRN

## 2015-12-24 MED ORDER — SODIUM CHLORIDE 0.9 % IV SOLN: 100.0000 mL | INTRAVENOUS | Status: DC | PRN

## 2015-12-24 MED ORDER — HEPARIN SODIUM (PORCINE) 5000 UNIT/ML IJ SOLN: 5000.0000 [IU] | Freq: Three times a day (TID) | INTRAMUSCULAR | Status: DC

## 2015-12-24 MED ORDER — METOPROLOL TARTRATE 50 MG PO TABS
50.0000 mg | ORAL_TABLET | Freq: Two times a day (BID) | ORAL | Status: DC
Start: 2015-12-24 — End: 2015-12-24

## 2015-12-24 MED ORDER — RISPERIDONE MICROSPHERES 25 MG IM SUSR: 25.0000 mg | INTRAMUSCULAR | Status: DC

## 2015-12-24 MED ORDER — TRAMADOL HCL 50 MG PO TABS
50.0000 mg | ORAL_TABLET | Freq: Four times a day (QID) | ORAL | Status: DC | PRN
  Administered 2015-12-24: 50 mg via ORAL

## 2015-12-24 MED ORDER — TRAMADOL HCL 50 MG PO TABS
ORAL_TABLET | ORAL | Status: AC
  Administered 2015-12-24: 50 mg via ORAL
  Filled 2015-12-24: qty 1

## 2015-12-24 MED ORDER — DIPHENHYDRAMINE HCL 25 MG PO CAPS
25.0000 mg | ORAL_CAPSULE | Freq: Four times a day (QID) | ORAL | Status: DC | PRN
  Administered 2015-12-24: 25 mg via ORAL

## 2015-12-24 MED ORDER — DIPHENHYDRAMINE HCL 25 MG PO CAPS
ORAL_CAPSULE | ORAL | Status: AC
  Administered 2015-12-24: 25 mg via ORAL
  Filled 2015-12-24: qty 1

## 2015-12-24 MED ORDER — FOLIC ACID 1 MG PO TABS: 1.0000 mg | ORAL_TABLET | Freq: Every day | ORAL | Status: DC

## 2015-12-24 MED ORDER — DOXERCALCIFEROL 4 MCG/2ML IV SOLN
INTRAVENOUS | Status: AC
  Administered 2015-12-24: 5 ug via INTRAVENOUS
  Filled 2015-12-24: qty 4

## 2015-12-24 MED ORDER — SEVELAMER CARBONATE 800 MG PO TABS
ORAL_TABLET | ORAL | Status: AC
  Administered 2015-12-24: 3200 mg via ORAL
  Filled 2015-12-24: qty 4

## 2015-12-24 MED ORDER — ACETAMINOPHEN 325 MG PO TABS
650.0000 mg | ORAL_TABLET | Freq: Four times a day (QID) | ORAL | Status: DC | PRN
Start: 2015-12-24 — End: 2015-12-24

## 2015-12-24 MED ORDER — EPOETIN ALFA 20000 UNIT/ML IJ SOLN
INTRAMUSCULAR | Status: AC
  Administered 2015-12-24: 14000 [IU] via INTRAVENOUS
  Filled 2015-12-24: qty 1

## 2015-12-24 MED ORDER — HEPARIN SODIUM (PORCINE) 1000 UNIT/ML IJ SOLN
INTRAMUSCULAR | Status: AC
  Administered 2015-12-24: 1400 [IU] via INTRAVENOUS_CENTRAL
  Filled 2015-12-24: qty 5

## 2015-12-24 MED ORDER — LIDOCAINE HCL (PF) 1 % IJ SOLN
INTRAMUSCULAR | Status: AC
  Administered 2015-12-24: 0.5 mL via INTRADERMAL
  Filled 2015-12-24: qty 5

## 2015-12-24 MED ORDER — DOXERCALCIFEROL 4 MCG/2ML IV SOLN
5.0000 ug | INTRAVENOUS | Status: DC
  Administered 2015-12-24: 5 ug via INTRAVENOUS

## 2015-12-24 MED ORDER — VITAMIN B-12 1000 MCG PO TABS: 1000.0000 ug | ORAL_TABLET | Freq: Every day | ORAL | Status: DC

## 2015-12-24 MED ORDER — LIDOCAINE HCL (PF) 1 % IJ SOLN
5.0000 mL | INTRAMUSCULAR | Status: DC | PRN
  Administered 2015-12-24: 0.5 mL via INTRADERMAL

## 2015-12-24 MED ORDER — EPOETIN ALFA 2000 UNIT/ML IJ SOLN: 1400.0000 [IU] | INTRAMUSCULAR | Status: DC

## 2015-12-24 MED ORDER — SEVELAMER CARBONATE 800 MG PO TABS: 3200.0000 mg | ORAL_TABLET | Freq: Three times a day (TID) | ORAL | Status: DC

## 2015-12-24 MED ORDER — CYANOCOBALAMIN 1000 MCG PO TABS: 1000.0000 ug | ORAL_TABLET | Freq: Every day | ORAL | 0 refills | Status: DC

## 2015-12-24 MED ORDER — EPOETIN ALFA 20000 UNIT/ML IJ SOLN
14000.0000 [IU] | INTRAMUSCULAR | Status: DC
  Administered 2015-12-24: 14000 [IU] via INTRAVENOUS

## 2015-12-24 NOTE — ED Notes (Signed)
Pt refused IV  
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
## 2015-12-24 NOTE — ED Triage Notes (Signed)
Pt here for dialysis. Pt last dialyzed on Friday. Pt has no complaints.

## 2015-12-24 NOTE — Procedures (Signed)
   HEMODIALYSIS TREATMENT NOTE:  4 hour and 20 minute HD treatment completed via right upper arm AVF (15g ante/retrograde). Goal met: 3 liters removed without interruption in ultrafiltration.  HD was discontinued 10 minutes early due to clotting venous bloodline, despite heparinization and routine saline boluses.  All blood was returned and hemostasis was achieved within 15 minutes.  Pt stated she intended to leave hospital from HD unit.  Hemodynamically stable post-HD.  She signed [facility] waiver and left HD unit ambulatory with steady gait.  3A charge nurse notified.  Rockwell Alexandria, RN, CDN

## 2015-12-24 NOTE — ED Notes (Signed)
Hospitalist at bedside 

## 2015-12-24 NOTE — ED Notes (Signed)
Attempted report, receiving RN not available at this time. Reported would call back.
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
## 2015-12-25 NOTE — Discharge Summary (Signed)
[facility] Discharge Summary   Patient: Sara [name] [name]   PCP: Pcp Not In System DOB: 1981-01-18   Date of admission: 12/24/2015   Date of discharge: 12/25/2015    Discharge Diagnoses:  Active Problems:   Encounter for renal dialysis   Need for acute hemodialysis [facility])   Discharge Condition: fair  History of present illness: As per the H and P dictated on admission, " Sara [name] is a 35 y.o. female with Past medical history of ESRD on HD, bipolar disorder and schizophrenia. Pt present for her need for HD. Last HD was on Wednesday. Pt denies any fever, chills, headache, cough, chest pain, palpitation, shortness of breath, pedal edema, nausea, vomiting, diarrhea, constipation, abdominal pain, active bleeding, black color BM, fall, trauma or injury, dizziness, focal neurological deficit. "  Hospital Course:  Summary of her active problems in the hospital is as following. Active Problems:   Encounter for renal dialysis   Need for acute hemodialysis [facility]) Patient presented to ER with the complaints of requirement for hemodialysis. Nephrology was consulted. Hemodialysis initiated and completed. Post-hemodialysis patient was recommended to return to room to get herself evaluated prior to discharge to ensure that she seemed dynamically stable. For patient's anemia workup was initiated but she left before blood could be drawn.  patient left AMA after hemodialysis itself.  Procedures and Results:  HD   Consultations:  nephrology  The results of significant diagnostics from this hospitalization (including imaging, microbiology, ancillary and laboratory) are listed below for reference.    Significant Diagnostic Studies: No results found.  Microbiology: No results found for this or any previous visit (from the past 240 hour(s)).   Labs: CBC:  Recent Labs Lab 12/19/15 1147 12/21/15 0928 12/24/15 1136  WBC 6.2 6.9 5.8  NEUTROABS  --  3.7  --     HGB 7.6* 8.4* 7.8*  HCT 23.9* 26.1* 24.3*  MCV 84.5 84.7 84.7  PLT 227 261 0000000   Basic Metabolic Panel:  Recent Labs Lab 12/19/15 1147 12/21/15 0928 12/24/15 1136  NA  --  138 134*  K  --  3.7 4.3  CL  --  97* 97*  CO2  --  29 25  GLUCOSE  --  88 84  BUN  --  45* 55*  CREATININE 9.18* 9.16* 11.01*  CALCIUM  --  9.5 9.5  PHOS  --   --  7.2*   Liver Function Tests:  Recent Labs Lab 12/24/15 1136  ALBUMIN 3.3*   No results for input(s): LIPASE, AMYLASE in the last 168 hours. No results for input(s): AMMONIA in the last 168 hours. Cardiac Enzymes: No results for input(s): CKTOTAL, CKMB, CKMBINDEX, TROPONINI in the last 168 hours. BNP (last 3 results) No results for input(s): BNP in the last 8760 hours. CBG: No results for input(s): GLUCAP in the last 168 hours. Time spent: 20 minutes  Signed:  Berle Mull  [facility] 12/25/2015, 7:46 AM

## 2015-12-26 ENCOUNTER — Observation Stay (HOSPITAL_COMMUNITY)
Admission: EM | Admit: 2015-12-26 | Discharge: 2015-12-26 | Disposition: A | Discharge status: Home / Self Care | Payer: Medicare Other | Attending: Internal Medicine | Admitting: Internal Medicine

## 2015-12-26 ENCOUNTER — Encounter (HOSPITAL_COMMUNITY): Payer: Self-pay

## 2015-12-26 DIAGNOSIS — I5022 Chronic systolic (congestive) heart failure: Secondary | ICD-10-CM | POA: Diagnosis not present

## 2015-12-26 DIAGNOSIS — N2581 Secondary hyperparathyroidism of renal origin: Secondary | ICD-10-CM | POA: Diagnosis not present

## 2015-12-26 DIAGNOSIS — F17210 Nicotine dependence, cigarettes, uncomplicated: Secondary | ICD-10-CM | POA: Diagnosis not present

## 2015-12-26 DIAGNOSIS — I120 Hypertensive chronic kidney disease with stage 5 chronic kidney disease or end stage renal disease: Secondary | ICD-10-CM | POA: Diagnosis not present

## 2015-12-26 DIAGNOSIS — N250 Renal osteodystrophy: Secondary | ICD-10-CM | POA: Diagnosis not present

## 2015-12-26 DIAGNOSIS — I132 Hypertensive heart and chronic kidney disease with heart failure and with stage 5 chronic kidney disease, or end stage renal disease: Principal | ICD-10-CM | POA: Insufficient documentation

## 2015-12-26 DIAGNOSIS — I10 Essential (primary) hypertension: Secondary | ICD-10-CM | POA: Diagnosis not present

## 2015-12-26 DIAGNOSIS — N186 End stage renal disease: Secondary | ICD-10-CM | POA: Diagnosis not present

## 2015-12-26 DIAGNOSIS — D631 Anemia in chronic kidney disease: Secondary | ICD-10-CM | POA: Diagnosis not present

## 2015-12-26 DIAGNOSIS — Z992 Dependence on renal dialysis: Secondary | ICD-10-CM | POA: Insufficient documentation

## 2015-12-26 MED ORDER — EPOETIN ALFA 10000 UNIT/ML IJ SOLN
10000.0000 [IU] | INTRAMUSCULAR | Status: DC
Start: 2015-12-26 — End: 2015-12-26

## 2015-12-26 MED ORDER — PARICALCITOL 5 MCG/ML IV SOLN: 5.0000 ug | Freq: Once | INTRAVENOUS | Status: DC

## 2015-12-26 NOTE — ED Notes (Signed)
Per Jacqlyn Larsen RN on [address], dialysis nurse will not be able to come in until 1800 this evening.   Pt informed and states she can not wait and will leave AMA.

## 2015-12-26 NOTE — ED Triage Notes (Signed)
Pt here for dialysis.  Last dialysis was Monday.

## 2015-12-26 NOTE — ED Notes (Signed)
Pt says she doesn't want to wait until 6pm to start her dialysis treatment.  Pt say she will come back tomorrow.  EDP aware and pt left ama.

## 2015-12-26 NOTE — Consult Note (Signed)
TWILIA BERHE MRN: [id number] DOB/AGE: 1980-06-26 35 y.o. Primary Care Physician:Pcp Not In System Admit date: 12/26/2015 Chief Complaint:  Chief Complaint  Patient presents with  . needs dialysis   HPI: Pt is 35 year old female with past medical hx of ESRD who was admitted with c/o dyspnea," I need dialysis "  HPI dates 1-2 days ago started feeling bad pt came to ER asking for her dialysis. Pt says " I need my dialysis . I had my dialysis this last Wednesday on  12/24/15 ". No c/o fever/cough/chills NO c/o nausea/vomiting. NO c/o abdominal pain. No c/o hematuria      Past Medical History:  Diagnosis Date  . Acute myopericarditis    hx/notes 10/09/2014  . Bipolar disorder ([place])    [name] 10/09/2014  . CHF (congestive heart failure) ([place])    systolic/notes [id number]  . Chronic anemia    [name] 10/09/2014  . Current use of steroid medication 12/21/2015  . ESRD (end stage renal disease) on dialysis [facility])    "[name]; [facility]" (10/09/2014)  . History of blood transfusion    "this is probably my 3rd" (10/09/2014)  . Hypertension   . Low back pain   . Lupus ([place])    lupus w nephritis  . 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 Non-compliant patient   . Positive ANA (antinuclear antibody) 08/16/2012  . Positive Smith antibody 08/16/2012  . Pregnancy   . Psychosis   . Schizoaffective disorder, bipolar type ([place]) 11/20/2014  . Schizophrenia ([place])    [name] 10/09/2014  . Tobacco abuse 02/20/2014        Family History  Problem Relation Age of Onset  . Drug abuse Father   . Kidney disease Father     Social History:  reports that she has been smoking Cigarettes.  She has a 2.00 pack-year smoking history. She has never used smokeless tobacco. She reports that she does not drink alcohol or use drugs.   Allergies:  Allergies  Allergen Reactions  . Ativan [Lorazepam] Swelling and Other (See Comments)    Dysarthria(patient has difficulty speaking and slurred speech); denies swelling, itching,  pain, or numbness.  [name] [Ziprasidone Hcl] Itching and Swelling    Tongue swelling  . Keflex [Cephalexin] Swelling and Other (See Comments)    Tongue swelling. Can't talk   . Haldol [Haloperidol Lactate] Swelling    Tongue swelling. 05/31/15 - MD ok with giving as pt has tolerated in the past Pt can take benadryl.  . Other Itching    wool     (Not in a hospital admission)     [id number] from the symptoms mentioned above,there are no other symptoms referable to all systems reviewed.      Physical Exam: Vital signs in last 24 hours: Temp:  [97.6 F (36.4 C)] 97.6 F (36.4 C) (09/20 0753) Pulse Rate:  [107] 107 (09/20 0753) Resp:  [20] 20 (09/20 0753) BP: (135)/(100) 135/100 (09/20 0753) SpO2:  [99 %] 99 % (09/20 0753) Weight:  [150 lb (68 kg)] 150 lb (68 kg) (09/20 0820) Weight change:     Intake/Output from previous day: No intake/output data recorded. No intake/output data recorded.   Physical Exam: General- pt is awake,alert, follows comands Resp- No acute REsp distress, chest clear to asuculatation CVS- S1S2 regular in rate and rhythm, NO rubs  GIT- BS+, soft, NT, ND EXT- No LE Edema, NO Cyanosis CNS- CN 2-12 grossly intact. Moving all 4 extremities Access- AVF+    Lab Results:  CBC    Component Value Date/Time   WBC 5.8 12/24/2015 1136   RBC 2.87 (L) 12/24/2015 1136   HGB 7.8 (L) 12/24/2015 1136   HCT 24.3 (L) 12/24/2015 1136   PLT 254 12/24/2015 1136   MCV 84.7 12/24/2015 1136   MCH 27.2 12/24/2015 1136   MCHC 32.1 12/24/2015 1136   RDW 17.4 (H) 12/24/2015 1136   LYMPHSABS 2.2 12/21/2015 0928   MONOABS 0.7 12/21/2015 0928   EOSABS 0.3 12/21/2015 0928   BASOSABS 0.0 12/21/2015 0928      BMET BMP Latest Ref Rng & Units 12/24/2015 12/21/2015 12/19/2015  Glucose 65 - 99 mg/dL 84 88 -  BUN 6 - 20 mg/dL 55(H) 45(H) -  Creatinine 0.44 - 1.00 mg/dL 11.01(H) 9.16(H) 9.18(H)  Sodium 135 - 145 mmol/L 134(L) 138 -  Potassium 3.5 - 5.1 mmol/L 4.3  3.7 -  Chloride 101 - 111 mmol/L 97(L) 97(L) -  CO2 22 - 32 mmol/L 25 29 -  Calcium 8.9 - 10.3 mg/dL 9.5 9.5 -         Lab Results  Component Value Date   PTH 1,100 (H) 11/19/2015   PTH Comment 11/19/2015   CALCIUM 9.5 12/24/2015   CAION 1.18 12/17/2015   PHOS 7.2 (H) 12/24/2015      Impression: 1)Renal  ESRD on HD                Pt is not on regular  Schedule sec to her complaince/adherence issues                Will dialyze pt today  2)HTN Bp is not at goal  3)Anemia In ESRD the goal for HGb is 9--11. 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   Not at goal sec to non adherence to hd tx Phosphorus not at goal.  on binders  5)Psych .  Hx of   psycosis Schizophrenia Primary MD following  6)Electrolytes  Normokalemic   Hx of Hyponatremia   7)Acid base Co2  at goal    Plan:  Will dialyze today Will use 2 k bath Will keep on epo-10K  Will try to take 2.5 liters off if possible   BHUTANI,MANPREET S 12/26/2015, 8:54 AM
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
## 2015-12-31 ENCOUNTER — Encounter (HOSPITAL_COMMUNITY): Payer: Self-pay | Admitting: Cardiology

## 2015-12-31 ENCOUNTER — Observation Stay (HOSPITAL_COMMUNITY)
Admission: EM | Admit: 2015-12-31 | Discharge: 2015-12-31 | Disposition: A | Discharge status: Home / Self Care | Payer: Medicare Other | Attending: Internal Medicine | Admitting: Internal Medicine

## 2015-12-31 DIAGNOSIS — I509 Heart failure, unspecified: Secondary | ICD-10-CM | POA: Diagnosis not present

## 2015-12-31 DIAGNOSIS — N186 End stage renal disease: Secondary | ICD-10-CM | POA: Diagnosis not present

## 2015-12-31 DIAGNOSIS — Z79899 Other long term (current) drug therapy: Secondary | ICD-10-CM | POA: Diagnosis not present

## 2015-12-31 DIAGNOSIS — Z4931 Encounter for adequacy testing for hemodialysis: Secondary | ICD-10-CM | POA: Diagnosis present

## 2015-12-31 DIAGNOSIS — Z992 Dependence on renal dialysis: Secondary | ICD-10-CM

## 2015-12-31 DIAGNOSIS — N250 Renal osteodystrophy: Secondary | ICD-10-CM | POA: Diagnosis not present

## 2015-12-31 DIAGNOSIS — I120 Hypertensive chronic kidney disease with stage 5 chronic kidney disease or end stage renal disease: Secondary | ICD-10-CM | POA: Diagnosis not present

## 2015-12-31 DIAGNOSIS — E875 Hyperkalemia: Secondary | ICD-10-CM

## 2015-12-31 DIAGNOSIS — I132 Hypertensive heart and chronic kidney disease with heart failure and with stage 5 chronic kidney disease, or end stage renal disease: Principal | ICD-10-CM | POA: Insufficient documentation

## 2015-12-31 DIAGNOSIS — F17210 Nicotine dependence, cigarettes, uncomplicated: Secondary | ICD-10-CM | POA: Diagnosis not present

## 2015-12-31 LAB — BASIC METABOLIC PANEL
BUN: 132 mg/dL — ABNORMAL HIGH (ref 6–20)
CHLORIDE: 97 mmol/L — AB (ref 101–111)
CO2: 15 mmol/L — ABNORMAL LOW (ref 22–32)
CREATININE: 18.45 mg/dL — AB (ref 0.44–1.00)
Calcium: 10 mg/dL (ref 8.9–10.3)
GFR calc Af Amer: 2 mL/min — ABNORMAL LOW (ref >60)
GFR, EST NON AFRICAN AMERICAN: 2 mL/min — AB (ref >60)
GLUCOSE: 83 mg/dL (ref 65–99)
Potassium: 7.3 mmol/L (ref 3.5–5.1)
SODIUM: 133 mmol/L — AB (ref 135–145)

## 2015-12-31 LAB — CBC
HEMATOCRIT: 24.9 % — AB (ref 36.0–46.0)
HEMOGLOBIN: 8.1 g/dL — AB (ref 12.0–15.0)
MCH: 27 pg (ref 26.0–34.0)
MCHC: 32.5 g/dL (ref 30.0–36.0)
MCV: 83 fL (ref 78.0–100.0)
Platelets: 234 10*3/uL (ref 150–400)
RBC: 3 MIL/uL — AB (ref 3.87–5.11)
RDW: 16.8 % — ABNORMAL HIGH (ref 11.5–15.5)
WBC: 7.7 10*3/uL (ref 4.0–10.5)

## 2015-12-31 MED ORDER — SEVELAMER CARBONATE 800 MG PO TABS
3200.0000 mg | ORAL_TABLET | Freq: Three times a day (TID) | ORAL | Status: DC
  Administered 2015-12-31: 3200 mg via ORAL

## 2015-12-31 MED ORDER — LIDOCAINE HCL (PF) 1 % IJ SOLN
5.0000 mL | INTRAMUSCULAR | Status: DC | PRN
  Administered 2015-12-31: 0.5 mL via INTRADERMAL

## 2015-12-31 MED ORDER — LIDOCAINE HCL (PF) 1 % IJ SOLN
INTRAMUSCULAR | Status: AC
  Administered 2015-12-31: 0.5 mL via INTRADERMAL
  Filled 2015-12-31: qty 5

## 2015-12-31 MED ORDER — EPOETIN ALFA 20000 UNIT/ML IJ SOLN
14000.0000 [IU] | INTRAMUSCULAR | Status: DC
  Administered 2015-12-31: 14000 [IU] via INTRAVENOUS

## 2015-12-31 MED ORDER — TRAMADOL HCL 50 MG PO TABS
50.0000 mg | ORAL_TABLET | Freq: Two times a day (BID) | ORAL | Status: DC | PRN
  Administered 2015-12-31: 50 mg via ORAL

## 2015-12-31 MED ORDER — DIPHENHYDRAMINE HCL 25 MG PO CAPS
25.0000 mg | ORAL_CAPSULE | Freq: Four times a day (QID) | ORAL | Status: DC | PRN
  Administered 2015-12-31: 25 mg via ORAL

## 2015-12-31 MED ORDER — HEPARIN SODIUM (PORCINE) 1000 UNIT/ML DIALYSIS
20.0000 [IU]/kg | INTRAMUSCULAR | Status: DC | PRN
  Administered 2015-12-31: 1400 [IU] via INTRAVENOUS_CENTRAL

## 2015-12-31 MED ORDER — DOXERCALCIFEROL 4 MCG/2ML IV SOLN
5.0000 ug | INTRAVENOUS | Status: DC
  Administered 2015-12-31: 5 ug via INTRAVENOUS

## 2015-12-31 MED ORDER — SODIUM CHLORIDE 0.9 % IV SOLN: 100.0000 mL | INTRAVENOUS | Status: DC | PRN

## 2015-12-31 MED ORDER — SEVELAMER CARBONATE 800 MG PO TABS
ORAL_TABLET | ORAL | Status: AC
Start: 2015-12-31 — End: 2015-12-31
  Administered 2015-12-31: 3200 mg via ORAL
  Filled 2015-12-31: qty 4

## 2015-12-31 MED ORDER — HEPARIN SODIUM (PORCINE) 1000 UNIT/ML IJ SOLN
INTRAMUSCULAR | Status: AC
Start: 2015-12-31 — End: 2015-12-31
  Administered 2015-12-31: 1400 [IU] via INTRAVENOUS_CENTRAL
  Filled 2015-12-31: qty 6

## 2015-12-31 MED ORDER — PENTAFLUOROPROP-TETRAFLUOROETH EX AERO: 1.0000 "application " | INHALATION_SPRAY | CUTANEOUS | Status: DC | PRN

## 2015-12-31 MED ORDER — LIDOCAINE-PRILOCAINE 2.5-2.5 % EX CREA: 1.0000 "application " | TOPICAL_CREAM | CUTANEOUS | Status: DC | PRN

## 2015-12-31 MED ORDER — TRAMADOL HCL 50 MG PO TABS
ORAL_TABLET | ORAL | Status: AC
  Administered 2015-12-31: 50 mg via ORAL
  Filled 2015-12-31: qty 1

## 2015-12-31 MED ORDER — DIPHENHYDRAMINE HCL 25 MG PO CAPS
ORAL_CAPSULE | ORAL | Status: AC
Start: 2015-12-31 — End: 2015-12-31
  Administered 2015-12-31: 25 mg via ORAL
  Filled 2015-12-31: qty 1

## 2015-12-31 MED ORDER — DOXERCALCIFEROL 4 MCG/2ML IV SOLN
INTRAVENOUS | Status: AC
Start: 2015-12-31 — End: 2015-12-31
  Administered 2015-12-31: 5 ug via INTRAVENOUS
  Filled 2015-12-31: qty 4

## 2015-12-31 MED ORDER — EPOETIN ALFA 20000 UNIT/ML IJ SOLN
INTRAMUSCULAR | Status: AC
Start: 2015-12-31 — End: 2015-12-31
  Administered 2015-12-31: 14000 [IU] via INTRAVENOUS
  Filled 2015-12-31: qty 1
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
## 2015-12-31 NOTE — ED Notes (Signed)
CRITICAL VALUE ALERT  Critical value received: K 7.3  Date of notification:  12/31/15  Time of notification:  [phone]     Nurse who received alert:  Susa Day  MD notified (1st page): Dr. Rogene Houston

## 2015-12-31 NOTE — ED Triage Notes (Signed)
Here for dialysis
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
## 2016-01-01 NOTE — Discharge Summary (Signed)
Patient left AMA before repeat K could be drawn after HD.  Domingo Mend, MD [facility] Pager: [phone]

## 2016-01-02 ENCOUNTER — Observation Stay (HOSPITAL_COMMUNITY)
Admission: EM | Admit: 2016-01-02 | Discharge: 2016-01-02 | Disposition: A | Discharge status: Home / Self Care | Payer: Medicare Other | Attending: Internal Medicine | Admitting: Internal Medicine

## 2016-01-02 ENCOUNTER — Encounter (HOSPITAL_COMMUNITY): Payer: Self-pay

## 2016-01-02 DIAGNOSIS — I132 Hypertensive heart and chronic kidney disease with heart failure and with stage 5 chronic kidney disease, or end stage renal disease: Secondary | ICD-10-CM | POA: Diagnosis not present

## 2016-01-02 DIAGNOSIS — I5022 Chronic systolic (congestive) heart failure: Secondary | ICD-10-CM | POA: Insufficient documentation

## 2016-01-02 DIAGNOSIS — I120 Hypertensive chronic kidney disease with stage 5 chronic kidney disease or end stage renal disease: Secondary | ICD-10-CM | POA: Diagnosis not present

## 2016-01-02 DIAGNOSIS — I10 Essential (primary) hypertension: Secondary | ICD-10-CM | POA: Diagnosis not present

## 2016-01-02 DIAGNOSIS — N250 Renal osteodystrophy: Secondary | ICD-10-CM | POA: Diagnosis not present

## 2016-01-02 DIAGNOSIS — Z7952 Long term (current) use of systemic steroids: Secondary | ICD-10-CM | POA: Diagnosis not present

## 2016-01-02 DIAGNOSIS — N186 End stage renal disease: Secondary | ICD-10-CM | POA: Diagnosis not present

## 2016-01-02 DIAGNOSIS — Z79899 Other long term (current) drug therapy: Secondary | ICD-10-CM | POA: Insufficient documentation

## 2016-01-02 DIAGNOSIS — Z992 Dependence on renal dialysis: Secondary | ICD-10-CM | POA: Insufficient documentation

## 2016-01-02 DIAGNOSIS — N2581 Secondary hyperparathyroidism of renal origin: Secondary | ICD-10-CM | POA: Insufficient documentation

## 2016-01-02 DIAGNOSIS — N185 Chronic kidney disease, stage 5: Secondary | ICD-10-CM | POA: Diagnosis not present

## 2016-01-02 DIAGNOSIS — F17210 Nicotine dependence, cigarettes, uncomplicated: Secondary | ICD-10-CM | POA: Diagnosis not present

## 2016-01-02 DIAGNOSIS — Z91199 Patient's noncompliance with other medical treatment and regimen due to unspecified reason: Secondary | ICD-10-CM

## 2016-01-02 DIAGNOSIS — D631 Anemia in chronic kidney disease: Secondary | ICD-10-CM | POA: Insufficient documentation

## 2016-01-02 DIAGNOSIS — Z9119 Patient's noncompliance with other medical treatment and regimen: Secondary | ICD-10-CM | POA: Insufficient documentation

## 2016-01-02 LAB — RENAL FUNCTION PANEL
Albumin: 3.3 g/dL — ABNORMAL LOW (ref 3.5–5.0)
Anion gap: 13 (ref 5–15)
BUN: 65 mg/dL — ABNORMAL HIGH (ref 6–20)
CO2: 25 mmol/L (ref 22–32)
Calcium: 9.4 mg/dL (ref 8.9–10.3)
Chloride: 94 mmol/L — ABNORMAL LOW (ref 101–111)
Creatinine, Ser: 11.53 mg/dL — ABNORMAL HIGH (ref 0.44–1.00)
GFR calc Af Amer: 4 mL/min — ABNORMAL LOW (ref >60)
GFR calc non Af Amer: 4 mL/min — ABNORMAL LOW (ref >60)
Glucose, Bld: 106 mg/dL — ABNORMAL HIGH (ref 65–99)
Phosphorus: 8.3 mg/dL — ABNORMAL HIGH (ref 2.5–4.6)
Potassium: 4 mmol/L (ref 3.5–5.1)
Sodium: 132 mmol/L — ABNORMAL LOW (ref 135–145)

## 2016-01-02 LAB — CBC
HEMATOCRIT: 24.2 % — AB (ref 36.0–46.0)
HEMOGLOBIN: 7.8 g/dL — AB (ref 12.0–15.0)
MCH: 26.3 pg (ref 26.0–34.0)
MCHC: 32.2 g/dL (ref 30.0–36.0)
MCV: 81.5 fL (ref 78.0–100.0)
Platelets: 210 10*3/uL (ref 150–400)
RBC: 2.97 MIL/uL — ABNORMAL LOW (ref 3.87–5.11)
RDW: 17 % — ABNORMAL HIGH (ref 11.5–15.5)
WBC: 7.6 10*3/uL (ref 4.0–10.5)

## 2016-01-02 MED ORDER — LIDOCAINE HCL (PF) 1 % IJ SOLN
INTRAMUSCULAR | Status: AC
  Administered 2016-01-02: 0.5 mL via INTRADERMAL
  Filled 2016-01-02: qty 5

## 2016-01-02 MED ORDER — EPOETIN ALFA 10000 UNIT/ML IJ SOLN: 8000.0000 [IU] | INTRAMUSCULAR | Status: DC

## 2016-01-02 MED ORDER — EPOETIN ALFA 10000 UNIT/ML IJ SOLN
INTRAMUSCULAR | Status: AC
  Administered 2016-01-02: 8000 [IU] via INTRAVENOUS
  Filled 2016-01-02: qty 1

## 2016-01-02 MED ORDER — VITAMIN B-12 1000 MCG PO TABS: 1000.0000 ug | ORAL_TABLET | Freq: Every day | ORAL | Status: DC

## 2016-01-02 MED ORDER — HEPARIN SODIUM (PORCINE) 1000 UNIT/ML IJ SOLN
INTRAMUSCULAR | Status: AC
  Administered 2016-01-02: 1400 [IU] via INTRAVENOUS_CENTRAL
  Filled 2016-01-02: qty 4

## 2016-01-02 MED ORDER — HEPARIN SODIUM (PORCINE) 1000 UNIT/ML DIALYSIS
20.0000 [IU]/kg | INTRAMUSCULAR | Status: DC | PRN
  Administered 2016-01-02: 1400 [IU] via INTRAVENOUS_CENTRAL

## 2016-01-02 MED ORDER — SEVELAMER CARBONATE 800 MG PO TABS
3200.0000 mg | ORAL_TABLET | Freq: Three times a day (TID) | ORAL | Status: DC
  Administered 2016-01-02: 3200 mg via ORAL

## 2016-01-02 MED ORDER — DIPHENHYDRAMINE HCL 25 MG PO CAPS
ORAL_CAPSULE | ORAL | Status: AC
  Administered 2016-01-02: 25 mg via ORAL
  Filled 2016-01-02: qty 1

## 2016-01-02 MED ORDER — PREDNISONE 20 MG PO TABS: 20.0000 mg | ORAL_TABLET | Freq: Every day | ORAL | Status: DC

## 2016-01-02 MED ORDER — TRAMADOL HCL 50 MG PO TABS
50.0000 mg | ORAL_TABLET | Freq: Four times a day (QID) | ORAL | Status: DC | PRN
  Administered 2016-01-02: 50 mg via ORAL

## 2016-01-02 MED ORDER — LIDOCAINE HCL (PF) 1 % IJ SOLN
5.0000 mL | INTRAMUSCULAR | Status: DC | PRN
  Administered 2016-01-02: 0.5 mL via INTRADERMAL

## 2016-01-02 MED ORDER — SODIUM CHLORIDE 0.9 % IV SOLN
100.0000 mL | INTRAVENOUS | Status: DC | PRN
Start: 2016-01-02 — End: 2016-01-02

## 2016-01-02 MED ORDER — SEVELAMER CARBONATE 800 MG PO TABS
ORAL_TABLET | ORAL | Status: AC
  Administered 2016-01-02: 3200 mg via ORAL
  Filled 2016-01-02: qty 4

## 2016-01-02 MED ORDER — HEPARIN SODIUM (PORCINE) 5000 UNIT/ML IJ SOLN: 5000.0000 [IU] | Freq: Three times a day (TID) | INTRAMUSCULAR | Status: DC

## 2016-01-02 MED ORDER — RISPERIDONE MICROSPHERES 25 MG IM SUSR: 25.0000 mg | INTRAMUSCULAR | Status: DC

## 2016-01-02 MED ORDER — FOLIC ACID 1 MG PO TABS: 1.0000 mg | ORAL_TABLET | Freq: Every day | ORAL | Status: DC

## 2016-01-02 MED ORDER — PENTAFLUOROPROP-TETRAFLUOROETH EX AERO: 1.0000 "application " | INHALATION_SPRAY | CUTANEOUS | Status: DC | PRN

## 2016-01-02 MED ORDER — SODIUM CHLORIDE 0.9 % IV SOLN: 100.0000 mL | INTRAVENOUS | Status: DC | PRN

## 2016-01-02 MED ORDER — DOXERCALCIFEROL 4 MCG/2ML IV SOLN
INTRAVENOUS | Status: AC
  Administered 2016-01-02: 5 ug via INTRAVENOUS
  Filled 2016-01-02: qty 4

## 2016-01-02 MED ORDER — PARICALCITOL 5 MCG/ML IV SOLN
5.0000 ug | Freq: Once | INTRAVENOUS | Status: DC
  Filled 2016-01-02: qty 1

## 2016-01-02 MED ORDER — DOXERCALCIFEROL 4 MCG/2ML IV SOLN
5.0000 ug | INTRAVENOUS | Status: DC
  Administered 2016-01-02: 5 ug via INTRAVENOUS

## 2016-01-02 MED ORDER — METOPROLOL TARTRATE 50 MG PO TABS
50.0000 mg | ORAL_TABLET | Freq: Two times a day (BID) | ORAL | Status: DC
  Administered 2016-01-02: 50 mg via ORAL
  Filled 2016-01-02: qty 1

## 2016-01-02 MED ORDER — EPOETIN ALFA 10000 UNIT/ML IJ SOLN
8000.0000 [IU] | INTRAMUSCULAR | Status: DC
  Administered 2016-01-02: 8000 [IU] via INTRAVENOUS

## 2016-01-02 MED ORDER — TRAMADOL HCL 50 MG PO TABS
ORAL_TABLET | ORAL | Status: AC
  Administered 2016-01-02: 50 mg via ORAL
  Filled 2016-01-02: qty 1

## 2016-01-02 MED ORDER — FUROSEMIDE 40 MG PO TABS: 120.0000 mg | ORAL_TABLET | Freq: Every day | ORAL | Status: DC

## 2016-01-02 MED ORDER — DIPHENHYDRAMINE HCL 25 MG PO CAPS
25.0000 mg | ORAL_CAPSULE | Freq: Once | ORAL | Status: AC
  Administered 2016-01-02: 25 mg via ORAL

## 2016-01-02 MED ORDER — ACETAMINOPHEN 500 MG PO TABS: 1000.0000 mg | ORAL_TABLET | Freq: Four times a day (QID) | ORAL | Status: DC | PRN

## 2016-01-02 NOTE — Procedures (Signed)
   HEMODIALYSIS TREATMENT NOTE:  2 hours and 50 minutes of HD completed without problems or pt complaints then pt abruptly stated, "I"m ready to come off."  When asked what could be done to make her more comfortable she simply repeated, "I'm ready to come off."  Net UF 2 liters.  All blood was returned and hemostasis was achieved within 15 minutes.  Post HD BP 151/103, pulse 101.  Metoprolol 50mg  given po. Pt confirmed she intended to leave hospital from HD unit, AMA.  Waivers were signed. Sodium/fluid restriction and renal diet were encouraged.  Pt left at 1330, ambulatory with steady gait.  ED secretary Helene Kelp), Drs. Theador Hawthorne and Marin Comment were notified.  Rockwell Alexandria, RN, CDN

## 2016-01-02 NOTE — ED Triage Notes (Signed)
Pt here for dialysis, last treatment was Monday.  Pt also reports rash on lower abd.  C/O itching.

## 2016-01-02 NOTE — ED Notes (Signed)
hospitalist in to see pt. Dialysis nurse in hospital and per secretary will be ready for pt in about 45 minutes.

## 2016-01-02 NOTE — ED Notes (Addendum)
nephrologist in to see pt
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
## 2016-01-02 NOTE — Consult Note (Signed)
Sara [name] MRN: [id number] DOB/AGE: Aug 10, 1980 35 y.o. Primary Care Physician:Pcp Not In System Admit date: 01/02/2016 Chief Complaint:  Chief Complaint  Patient presents with  . wants dialysis   HPI: Pt is 35 year old female with past medical hx of ESRD who was admitted with c/o dyspnea," I need dialysis "  HPI dates 1-2 days ago started feeling bad pt came to ER asking for her dialysis. Pt says " I need my dialysis .". No c/o fever/cough/chills NO c/o nausea/vomiting. NO c/o abdominal pain. No c/o hematuria . NO c/o syncope.     Past Medical History:  Diagnosis Date  . Acute myopericarditis    hx/notes 10/09/2014  . Bipolar disorder ([place])    [name] 10/09/2014  . CHF (congestive heart failure) ([place])    systolic/notes [id number]  . Chronic anemia    [name] 10/09/2014  . Current use of steroid medication 12/21/2015  . ESRD (end stage renal disease) on dialysis [facility])    "[name]; [facility]" (10/09/2014)  . History of blood transfusion    "this is probably my 3rd" (10/09/2014)  . Hypertension   . Low back pain   . Lupus ([place])    lupus w nephritis  . 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 Non-compliant patient   . Positive ANA (antinuclear antibody) 08/16/2012  . Positive Smith antibody 08/16/2012  . Pregnancy   . Psychosis   . Schizoaffective disorder, bipolar type ([place]) 11/20/2014  . Schizophrenia ([place])    [name] 10/09/2014  . Tobacco abuse 02/20/2014        Family History  Problem Relation Age of Onset  . Drug abuse Father   . Kidney disease Father     Social History:  reports that she has been smoking Cigarettes.  She has a 2.00 pack-year smoking history. She has never used smokeless tobacco. She reports that she does not drink alcohol or use drugs.   Allergies:  Allergies  Allergen Reactions  . Ativan [Lorazepam] Swelling and Other (See Comments)    Dysarthria(patient has difficulty speaking and slurred speech); denies swelling, itching, pain, or numbness.  [name]  [Ziprasidone Hcl] Itching and Swelling    Tongue swelling  . Keflex [Cephalexin] Swelling and Other (See Comments)    Tongue swelling. Can't talk   . Haldol [Haloperidol Lactate] Swelling    Tongue swelling. 05/31/15 - MD ok with giving as pt has tolerated in the past Pt can take benadryl.  . Other Itching    wool     (Not in a hospital admission)     [id number] from the symptoms mentioned above,there are no other symptoms referable to all systems reviewed.      Physical Exam: Vital signs in last 24 hours: Temp:  [98.1 F (36.7 C)] 98.1 F (36.7 C) (09/27 0749) Pulse Rate:  [98] 98 (09/27 0749) Resp:  [20] 20 (09/27 0749) BP: (142)/(104) 142/104 (09/27 0749) Weight:  [150 lb (68 kg)] 150 lb (68 kg) (09/27 0748) Weight change:     Intake/Output from previous day: No intake/output data recorded. No intake/output data recorded.   Physical Exam: General- pt is awake,alert, follows comands Resp- No acute REsp distress, chest clear to asuculatation CVS- S1S2 regular in rate and rhythm, NO rubs  GIT- BS+, soft, NT, ND EXT- No LE Edema, NO Cyanosis CNS- CN 2-12 grossly intact. Moving all 4 extremities Access- AVF+    Lab Results:  CBC    Component Value Date/Time   WBC 7.7 12/31/2015 0901   RBC  3.00 (L) 12/31/2015 0901   HGB 8.1 (L) 12/31/2015 0901   HCT 24.9 (L) 12/31/2015 0901   PLT 234 12/31/2015 0901   MCV 83.0 12/31/2015 0901   MCH 27.0 12/31/2015 0901   MCHC 32.5 12/31/2015 0901   RDW 16.8 (H) 12/31/2015 0901   LYMPHSABS 2.2 12/21/2015 0928   MONOABS 0.7 12/21/2015 0928   EOSABS 0.3 12/21/2015 0928   BASOSABS 0.0 12/21/2015 0928      BMET BMP Latest Ref Rng & Units 12/31/2015 12/24/2015 12/21/2015  Glucose 65 - 99 mg/dL 83 84 88  BUN 6 - 20 mg/dL 132(H) 55(H) 45(H)  Creatinine 0.44 - 1.00 mg/dL 18.45(H) 11.01(H) 9.16(H)  Sodium 135 - 145 mmol/L 133(L) 134(L) 138  Potassium 3.5 - 5.1 mmol/L 7.3(HH) 4.3 3.7  Chloride 101 - 111 mmol/L 97(L) 97(L)  97(L)  CO2 22 - 32 mmol/L 15(L) 25 29  Calcium 8.9 - 10.3 mg/dL 10.0 9.5 9.5         Lab Results  Component Value Date   PTH 1,100 (H) 11/19/2015   PTH Comment 11/19/2015   CALCIUM 10.0 12/31/2015   CAION 1.18 12/17/2015   PHOS 7.2 (H) 12/24/2015      Impression: 1)Renal  ESRD on HD                Pt is not on regular  Schedule sec to her complaince/adherence issues                Will dialyze pt today  2)HTN Bp is not at goal  3)Anemia In ESRD the goal for HGb is 9--11. 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   Not at goal sec to non adherence to hd tx   Will keep on zemplar/Hectrol  Phosphorus not at goal.  on binders  5)Psych .  Hx of   psycosis Schizophrenia Primary MD following  6)Electrolytes  Hx of Hyperkalemia   Hx of Hyponatremia   7)Acid base Co2  Not  at goal    Plan:  Will dialyze today Will use 2 k bath Will keep on epo-10K  Will try to take 2.5 liters off if possible   Sara [name] 01/02/2016, 9:06 AM

## 2016-01-03 LAB — HEPATITIS B SURFACE ANTIGEN: Hepatitis B Surface Ag: NEGATIVE

## 2016-01-04 ENCOUNTER — Encounter (HOSPITAL_COMMUNITY): Payer: Self-pay | Admitting: Emergency Medicine

## 2016-01-04 ENCOUNTER — Observation Stay (HOSPITAL_COMMUNITY)
Admission: EM | Admit: 2016-01-04 | Discharge: 2016-01-04 | Disposition: A | Discharge status: Home / Self Care | Payer: Medicare Other | Attending: Family Medicine | Admitting: Family Medicine

## 2016-01-04 DIAGNOSIS — I120 Hypertensive chronic kidney disease with stage 5 chronic kidney disease or end stage renal disease: Secondary | ICD-10-CM | POA: Diagnosis not present

## 2016-01-04 DIAGNOSIS — D631 Anemia in chronic kidney disease: Secondary | ICD-10-CM | POA: Insufficient documentation

## 2016-01-04 DIAGNOSIS — R21 Rash and other nonspecific skin eruption: Secondary | ICD-10-CM

## 2016-01-04 DIAGNOSIS — N185 Chronic kidney disease, stage 5: Secondary | ICD-10-CM | POA: Diagnosis not present

## 2016-01-04 DIAGNOSIS — N186 End stage renal disease: Secondary | ICD-10-CM | POA: Diagnosis not present

## 2016-01-04 DIAGNOSIS — N250 Renal osteodystrophy: Secondary | ICD-10-CM | POA: Diagnosis not present

## 2016-01-04 DIAGNOSIS — I509 Heart failure, unspecified: Secondary | ICD-10-CM | POA: Diagnosis not present

## 2016-01-04 DIAGNOSIS — I10 Essential (primary) hypertension: Secondary | ICD-10-CM | POA: Diagnosis not present

## 2016-01-04 DIAGNOSIS — I132 Hypertensive heart and chronic kidney disease with heart failure and with stage 5 chronic kidney disease, or end stage renal disease: Secondary | ICD-10-CM | POA: Diagnosis not present

## 2016-01-04 DIAGNOSIS — F17210 Nicotine dependence, cigarettes, uncomplicated: Secondary | ICD-10-CM | POA: Insufficient documentation

## 2016-01-04 DIAGNOSIS — N189 Chronic kidney disease, unspecified: Secondary | ICD-10-CM | POA: Diagnosis present

## 2016-01-04 DIAGNOSIS — M3214 Glomerular disease in systemic lupus erythematosus: Secondary | ICD-10-CM | POA: Diagnosis not present

## 2016-01-04 DIAGNOSIS — F919 Conduct disorder, unspecified: Secondary | ICD-10-CM | POA: Insufficient documentation

## 2016-01-04 DIAGNOSIS — F250 Schizoaffective disorder, bipolar type: Secondary | ICD-10-CM | POA: Insufficient documentation

## 2016-01-04 DIAGNOSIS — Z9119 Patient's noncompliance with other medical treatment and regimen: Secondary | ICD-10-CM | POA: Insufficient documentation

## 2016-01-04 DIAGNOSIS — Z992 Dependence on renal dialysis: Secondary | ICD-10-CM | POA: Diagnosis not present

## 2016-01-04 LAB — RENAL FUNCTION PANEL
ALBUMIN: 3 g/dL — AB (ref 3.5–5.0)
ANION GAP: 12 (ref 5–15)
BUN: 62 mg/dL — AB (ref 6–20)
CALCIUM: 9.3 mg/dL (ref 8.9–10.3)
CO2: 24 mmol/L (ref 22–32)
Chloride: 95 mmol/L — ABNORMAL LOW (ref 101–111)
Creatinine, Ser: 11.4 mg/dL — ABNORMAL HIGH (ref 0.44–1.00)
GFR calc Af Amer: 4 mL/min — ABNORMAL LOW (ref >60)
GFR, EST NON AFRICAN AMERICAN: 4 mL/min — AB (ref >60)
Glucose, Bld: 89 mg/dL (ref 65–99)
PHOSPHORUS: 7.2 mg/dL — AB (ref 2.5–4.6)
POTASSIUM: 4.6 mmol/L (ref 3.5–5.1)
Sodium: 131 mmol/L — ABNORMAL LOW (ref 135–145)

## 2016-01-04 LAB — CBC
HEMATOCRIT: 24.3 % — AB (ref 36.0–46.0)
Hemoglobin: 7.9 g/dL — ABNORMAL LOW (ref 12.0–15.0)
MCH: 26.4 pg (ref 26.0–34.0)
MCHC: 32.5 g/dL (ref 30.0–36.0)
MCV: 81.3 fL (ref 78.0–100.0)
PLATELETS: 279 10*3/uL (ref 150–400)
RBC: 2.99 MIL/uL — ABNORMAL LOW (ref 3.87–5.11)
RDW: 17.2 % — AB (ref 11.5–15.5)
WBC: 8.3 10*3/uL (ref 4.0–10.5)

## 2016-01-04 MED ORDER — CLOTRIMAZOLE 1 % EX CREA: TOPICAL_CREAM | Freq: Two times a day (BID) | CUTANEOUS | Status: DC

## 2016-01-04 MED ORDER — LIDOCAINE HCL (PF) 1 % IJ SOLN
5.0000 mL | INTRAMUSCULAR | Status: DC | PRN
  Administered 2016-01-04: 0.5 mL via INTRADERMAL

## 2016-01-04 MED ORDER — HEPARIN SODIUM (PORCINE) 1000 UNIT/ML IJ SOLN
INTRAMUSCULAR | Status: AC
  Administered 2016-01-04: 1300 [IU] via INTRAVENOUS_CENTRAL
  Filled 2016-01-04: qty 6

## 2016-01-04 MED ORDER — TRAMADOL HCL 50 MG PO TABS
50.0000 mg | ORAL_TABLET | Freq: Four times a day (QID) | ORAL | Status: DC | PRN
  Administered 2016-01-04: 50 mg via ORAL

## 2016-01-04 MED ORDER — ACETAMINOPHEN 500 MG PO TABS: 1000.0000 mg | ORAL_TABLET | Freq: Four times a day (QID) | ORAL | Status: DC | PRN

## 2016-01-04 MED ORDER — DIPHENHYDRAMINE HCL 25 MG PO CAPS
25.0000 mg | ORAL_CAPSULE | Freq: Four times a day (QID) | ORAL | Status: DC | PRN
  Administered 2016-01-04: 25 mg via ORAL

## 2016-01-04 MED ORDER — DOXERCALCIFEROL 4 MCG/2ML IV SOLN
5.0000 ug | INTRAVENOUS | Status: DC
  Administered 2016-01-04: 5 ug via INTRAVENOUS

## 2016-01-04 MED ORDER — TRAMADOL HCL 50 MG PO TABS
ORAL_TABLET | ORAL | Status: AC
  Administered 2016-01-04: 50 mg via ORAL
  Filled 2016-01-04: qty 1

## 2016-01-04 MED ORDER — EPOETIN ALFA 20000 UNIT/ML IJ SOLN
14000.0000 [IU] | INTRAMUSCULAR | Status: DC
  Administered 2016-01-04: 14000 [IU] via SUBCUTANEOUS

## 2016-01-04 MED ORDER — PREDNISONE 20 MG PO TABS: 20.0000 mg | ORAL_TABLET | Freq: Every day | ORAL | Status: DC

## 2016-01-04 MED ORDER — SODIUM CHLORIDE 0.9 % IV SOLN: 100.0000 mL | INTRAVENOUS | Status: DC | PRN

## 2016-01-04 MED ORDER — LIDOCAINE HCL (PF) 1 % IJ SOLN
INTRAMUSCULAR | Status: AC
  Administered 2016-01-04: 0.5 mL via INTRADERMAL
  Filled 2016-01-04: qty 5

## 2016-01-04 MED ORDER — EPOETIN ALFA 20000 UNIT/ML IJ SOLN
INTRAMUSCULAR | Status: AC
  Administered 2016-01-04: 14000 [IU] via SUBCUTANEOUS
  Filled 2016-01-04: qty 1

## 2016-01-04 MED ORDER — DOXERCALCIFEROL 4 MCG/2ML IV SOLN
INTRAVENOUS | Status: AC
  Administered 2016-01-04: 5 ug via INTRAVENOUS
  Filled 2016-01-04: qty 4

## 2016-01-04 MED ORDER — LIDOCAINE-PRILOCAINE 2.5-2.5 % EX CREA: 1.0000 "application " | TOPICAL_CREAM | CUTANEOUS | Status: DC | PRN

## 2016-01-04 MED ORDER — FUROSEMIDE 40 MG PO TABS
120.0000 mg | ORAL_TABLET | Freq: Every day | ORAL | Status: DC
  Administered 2016-01-04: 120 mg via ORAL
  Filled 2016-01-04: qty 3

## 2016-01-04 MED ORDER — DIPHENHYDRAMINE HCL 25 MG PO CAPS
ORAL_CAPSULE | ORAL | Status: AC
  Administered 2016-01-04: 25 mg via ORAL
  Filled 2016-01-04: qty 1

## 2016-01-04 MED ORDER — FOLIC ACID 1 MG PO TABS
1.0000 mg | ORAL_TABLET | Freq: Every day | ORAL | Status: DC
  Filled 2016-01-04: qty 1

## 2016-01-04 MED ORDER — HEPARIN SODIUM (PORCINE) 1000 UNIT/ML DIALYSIS
20.0000 [IU]/kg | INTRAMUSCULAR | Status: DC | PRN
  Administered 2016-01-04: 1300 [IU] via INTRAVENOUS_CENTRAL

## 2016-01-04 MED ORDER — SEVELAMER CARBONATE 800 MG PO TABS
3200.0000 mg | ORAL_TABLET | Freq: Three times a day (TID) | ORAL | Status: DC
  Administered 2016-01-04: 3200 mg via ORAL

## 2016-01-04 MED ORDER — SEVELAMER CARBONATE 800 MG PO TABS
ORAL_TABLET | ORAL | Status: AC
  Administered 2016-01-04: 3200 mg via ORAL
  Filled 2016-01-04: qty 4

## 2016-01-04 MED ORDER — VITAMIN B-12 1000 MCG PO TABS
1000.0000 ug | ORAL_TABLET | Freq: Every day | ORAL | Status: DC
  Administered 2016-01-04: 1000 ug via ORAL
  Filled 2016-01-04 (×4): qty 1

## 2016-01-04 MED ORDER — PENTAFLUOROPROP-TETRAFLUOROETH EX AERO
1.0000 | INHALATION_SPRAY | CUTANEOUS | Status: DC | PRN
Start: 2016-01-04 — End: 2016-01-04

## 2016-01-04 MED ORDER — METOPROLOL TARTRATE 50 MG PO TABS
50.0000 mg | ORAL_TABLET | Freq: Two times a day (BID) | ORAL | Status: DC
  Administered 2016-01-04: 50 mg via ORAL
  Filled 2016-01-04: qty 1

## 2016-01-04 NOTE — ED Triage Notes (Signed)
Pt reports is here for dialysis. Pt denies any pain,sob,cp. nad noted. Pt reports last had dialysis on 01/02/16.

## 2016-01-04 NOTE — Procedures (Addendum)
   HEMODIALYSIS TREATMENT NOTE:  4.5 hour low-heparin dialysis completed via right upper arm AVF (15g ante/retrograde). Goal met: 4 liters removed without interruption in ultrafiltration.  All blood was returned and hemostasis was achieved within 15 minutes. Pt verbalized she intended to leave hospital from HD unit after dialysis.  Diet/fluid restriction were reinforced.  She signed [place] waiver and left HD unit ambulatory with steady gait 136/82.  Dr. Sarajane Jews and 3A charge nurse were notified.  Rockwell Alexandria, RN, CDN
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
## 2016-01-04 NOTE — ED Notes (Signed)
Unable to locate patient in dialysis suite.  Unit 300 Charge Nurse notified as well, and the patient is not in the assigned room 315 on the floor.  Per [facility], pt to be discharge (AMA) from the E.D. Where patient was left "Off the floor"

## 2016-01-07 ENCOUNTER — Observation Stay (HOSPITAL_COMMUNITY)
Admission: EM | Admit: 2016-01-07 | Discharge: 2016-01-07 | Discharge status: Against Medical Advice | Payer: Medicare Other | Attending: Family Medicine | Admitting: Family Medicine

## 2016-01-07 ENCOUNTER — Encounter (HOSPITAL_COMMUNITY): Payer: Self-pay

## 2016-01-07 DIAGNOSIS — E8770 Fluid overload, unspecified: Principal | ICD-10-CM | POA: Insufficient documentation

## 2016-01-07 DIAGNOSIS — D631 Anemia in chronic kidney disease: Secondary | ICD-10-CM | POA: Diagnosis not present

## 2016-01-07 DIAGNOSIS — F250 Schizoaffective disorder, bipolar type: Secondary | ICD-10-CM | POA: Diagnosis not present

## 2016-01-07 DIAGNOSIS — Z79899 Other long term (current) drug therapy: Secondary | ICD-10-CM | POA: Insufficient documentation

## 2016-01-07 DIAGNOSIS — M3214 Glomerular disease in systemic lupus erythematosus: Secondary | ICD-10-CM | POA: Insufficient documentation

## 2016-01-07 DIAGNOSIS — I509 Heart failure, unspecified: Secondary | ICD-10-CM | POA: Diagnosis not present

## 2016-01-07 DIAGNOSIS — I132 Hypertensive heart and chronic kidney disease with heart failure and with stage 5 chronic kidney disease, or end stage renal disease: Secondary | ICD-10-CM | POA: Insufficient documentation

## 2016-01-07 DIAGNOSIS — F17210 Nicotine dependence, cigarettes, uncomplicated: Secondary | ICD-10-CM | POA: Insufficient documentation

## 2016-01-07 DIAGNOSIS — I5042 Chronic combined systolic (congestive) and diastolic (congestive) heart failure: Secondary | ICD-10-CM | POA: Diagnosis not present

## 2016-01-07 DIAGNOSIS — Z9119 Patient's noncompliance with other medical treatment and regimen: Secondary | ICD-10-CM | POA: Insufficient documentation

## 2016-01-07 DIAGNOSIS — Z992 Dependence on renal dialysis: Secondary | ICD-10-CM | POA: Diagnosis not present

## 2016-01-07 DIAGNOSIS — N186 End stage renal disease: Secondary | ICD-10-CM

## 2016-01-07 DIAGNOSIS — Z7952 Long term (current) use of systemic steroids: Secondary | ICD-10-CM | POA: Insufficient documentation

## 2016-01-07 DIAGNOSIS — I120 Hypertensive chronic kidney disease with stage 5 chronic kidney disease or end stage renal disease: Secondary | ICD-10-CM | POA: Diagnosis not present

## 2016-01-07 LAB — CBC
HEMATOCRIT: 25.7 % — AB (ref 36.0–46.0)
HEMOGLOBIN: 8.2 g/dL — AB (ref 12.0–15.0)
MCH: 26.1 pg (ref 26.0–34.0)
MCHC: 31.9 g/dL (ref 30.0–36.0)
MCV: 81.8 fL (ref 78.0–100.0)
Platelets: 313 10*3/uL (ref 150–400)
RBC: 3.14 MIL/uL — ABNORMAL LOW (ref 3.87–5.11)
RDW: 17.7 % — AB (ref 11.5–15.5)
WBC: 8.9 10*3/uL (ref 4.0–10.5)

## 2016-01-07 LAB — RENAL FUNCTION PANEL
ANION GAP: 12 (ref 5–15)
Albumin: 3.1 g/dL — ABNORMAL LOW (ref 3.5–5.0)
BUN: 68 mg/dL — AB (ref 6–20)
CHLORIDE: 97 mmol/L — AB (ref 101–111)
CO2: 23 mmol/L (ref 22–32)
Calcium: 9.5 mg/dL (ref 8.9–10.3)
Creatinine, Ser: 10.89 mg/dL — ABNORMAL HIGH (ref 0.44–1.00)
GFR calc Af Amer: 5 mL/min — ABNORMAL LOW (ref >60)
GFR calc non Af Amer: 4 mL/min — ABNORMAL LOW (ref >60)
GLUCOSE: 79 mg/dL (ref 65–99)
POTASSIUM: 4.5 mmol/L (ref 3.5–5.1)
Phosphorus: 6.9 mg/dL — ABNORMAL HIGH (ref 2.5–4.6)
Sodium: 132 mmol/L — ABNORMAL LOW (ref 135–145)

## 2016-01-07 MED ORDER — SEVELAMER CARBONATE 800 MG PO TABS
3200.0000 mg | ORAL_TABLET | Freq: Three times a day (TID) | ORAL | Status: DC
  Administered 2016-01-07: 3200 mg via ORAL

## 2016-01-07 MED ORDER — LIDOCAINE HCL (PF) 1 % IJ SOLN
INTRAMUSCULAR | Status: AC
  Administered 2016-01-07: 0.5 mL via INTRADERMAL
  Filled 2016-01-07: qty 5

## 2016-01-07 MED ORDER — DOXERCALCIFEROL 4 MCG/2ML IV SOLN
5.0000 ug | INTRAVENOUS | Status: DC
  Administered 2016-01-07: 5 ug via INTRAVENOUS

## 2016-01-07 MED ORDER — SODIUM CHLORIDE 0.9 % IV SOLN: 100.0000 mL | INTRAVENOUS | Status: DC | PRN

## 2016-01-07 MED ORDER — TRAMADOL HCL 50 MG PO TABS
50.0000 mg | ORAL_TABLET | Freq: Two times a day (BID) | ORAL | Status: AC | PRN
  Administered 2016-01-07: 50 mg via ORAL

## 2016-01-07 MED ORDER — DOXERCALCIFEROL 4 MCG/2ML IV SOLN
INTRAVENOUS | Status: AC
  Administered 2016-01-07: 5 ug via INTRAVENOUS
  Filled 2016-01-07: qty 4

## 2016-01-07 MED ORDER — SEVELAMER CARBONATE 800 MG PO TABS
ORAL_TABLET | ORAL | Status: AC
  Administered 2016-01-07: 3200 mg via ORAL
  Filled 2016-01-07: qty 4

## 2016-01-07 MED ORDER — LIDOCAINE HCL (PF) 1 % IJ SOLN
5.0000 mL | INTRAMUSCULAR | Status: DC | PRN
  Administered 2016-01-07: 0.5 mL via INTRADERMAL

## 2016-01-07 MED ORDER — EPOETIN ALFA 20000 UNIT/ML IJ SOLN
14000.0000 [IU] | INTRAMUSCULAR | Status: DC
  Administered 2016-01-07: 14000 [IU] via SUBCUTANEOUS

## 2016-01-07 MED ORDER — LIDOCAINE-PRILOCAINE 2.5-2.5 % EX CREA: 1.0000 "application " | TOPICAL_CREAM | CUTANEOUS | Status: DC | PRN

## 2016-01-07 MED ORDER — DIPHENHYDRAMINE HCL 25 MG PO CAPS
ORAL_CAPSULE | ORAL | Status: AC
  Administered 2016-01-07: 25 mg via ORAL
  Filled 2016-01-07: qty 1

## 2016-01-07 MED ORDER — DIPHENHYDRAMINE HCL 25 MG PO CAPS
25.0000 mg | ORAL_CAPSULE | Freq: Four times a day (QID) | ORAL | Status: DC | PRN
  Administered 2016-01-07: 25 mg via ORAL

## 2016-01-07 MED ORDER — PENTAFLUOROPROP-TETRAFLUOROETH EX AERO
1.0000 "application " | INHALATION_SPRAY | CUTANEOUS | Status: DC | PRN
  Administered 2016-01-07: 1 via TOPICAL

## 2016-01-07 MED ORDER — TRAMADOL HCL 50 MG PO TABS
ORAL_TABLET | ORAL | Status: AC
  Administered 2016-01-07: 50 mg via ORAL
  Filled 2016-01-07: qty 1

## 2016-01-07 MED ORDER — HEPARIN SODIUM (PORCINE) 1000 UNIT/ML DIALYSIS
20.0000 [IU]/kg | INTRAMUSCULAR | Status: DC | PRN
  Administered 2016-01-07: 1400 [IU] via INTRAVENOUS_CENTRAL

## 2016-01-07 MED ORDER — PENTAFLUOROPROP-TETRAFLUOROETH EX AERO
INHALATION_SPRAY | CUTANEOUS | Status: AC
  Administered 2016-01-07: 1 via TOPICAL
  Filled 2016-01-07: qty 103.5

## 2016-01-07 MED ORDER — EPOETIN ALFA 20000 UNIT/ML IJ SOLN
INTRAMUSCULAR | Status: AC
  Administered 2016-01-07: 14000 [IU] via SUBCUTANEOUS
  Filled 2016-01-07: qty 1

## 2016-01-07 MED ORDER — HEPARIN SODIUM (PORCINE) 1000 UNIT/ML DIALYSIS: 1000.0000 [IU] | INTRAMUSCULAR | Status: DC | PRN

## 2016-01-07 MED ORDER — HEPARIN SODIUM (PORCINE) 1000 UNIT/ML IJ SOLN
INTRAMUSCULAR | Status: AC
  Administered 2016-01-07: 1400 [IU] via INTRAVENOUS_CENTRAL
  Filled 2016-01-07: qty 5

## 2016-01-07 NOTE — Procedures (Signed)
   HEMODIALYSIS TREATMENT NOTE:  4.5 hour low-heparin dialysis completed via right upper arm AVF (15g ante/retrograde). Goal met: 4 liters removed without interruption in ultrafiltration.  All blood was returned and hemostasis was achieved within 15 minutes.  Pt stated she would leave hospital from HD unit after treatment.  Hemodynamically stable during and post dialysis. She signed [place] waiver and left ambulatory with steady gait.  3A charge nurse and ED secretary were notified.  Rockwell Alexandria, RN, CDN
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
## 2016-01-07 NOTE — ED Triage Notes (Signed)
Pt here for dialysis.  Last treatment was Friday.  Denies any complaints.

## 2016-01-07 NOTE — ED Notes (Signed)
Levada Dy, RN (dialysis nurse) called stating if pt could be brought to room 208, she could begin her dialysis while waiting for a bed.

## 2016-01-11 ENCOUNTER — Encounter (HOSPITAL_COMMUNITY): Payer: Self-pay | Admitting: Emergency Medicine

## 2016-01-11 ENCOUNTER — Observation Stay (HOSPITAL_COMMUNITY)
Admission: EM | Admit: 2016-01-11 | Discharge: 2016-01-11 | Disposition: A | Discharge status: Home / Self Care | Payer: Medicare Other | Attending: Internal Medicine | Admitting: Internal Medicine

## 2016-01-11 DIAGNOSIS — Z4901 Encounter for fitting and adjustment of extracorporeal dialysis catheter: Secondary | ICD-10-CM | POA: Diagnosis present

## 2016-01-11 DIAGNOSIS — Z79899 Other long term (current) drug therapy: Secondary | ICD-10-CM | POA: Diagnosis not present

## 2016-01-11 DIAGNOSIS — I120 Hypertensive chronic kidney disease with stage 5 chronic kidney disease or end stage renal disease: Secondary | ICD-10-CM | POA: Diagnosis not present

## 2016-01-11 DIAGNOSIS — E8770 Fluid overload, unspecified: Secondary | ICD-10-CM | POA: Diagnosis not present

## 2016-01-11 DIAGNOSIS — N186 End stage renal disease: Secondary | ICD-10-CM | POA: Diagnosis not present

## 2016-01-11 DIAGNOSIS — I5022 Chronic systolic (congestive) heart failure: Secondary | ICD-10-CM | POA: Diagnosis present

## 2016-01-11 DIAGNOSIS — I132 Hypertensive heart and chronic kidney disease with heart failure and with stage 5 chronic kidney disease, or end stage renal disease: Secondary | ICD-10-CM | POA: Diagnosis not present

## 2016-01-11 DIAGNOSIS — D631 Anemia in chronic kidney disease: Secondary | ICD-10-CM | POA: Diagnosis present

## 2016-01-11 DIAGNOSIS — Z91199 Patient's noncompliance with other medical treatment and regimen due to unspecified reason: Secondary | ICD-10-CM

## 2016-01-11 DIAGNOSIS — Z9119 Patient's noncompliance with other medical treatment and regimen: Secondary | ICD-10-CM

## 2016-01-11 DIAGNOSIS — Z992 Dependence on renal dialysis: Secondary | ICD-10-CM | POA: Diagnosis not present

## 2016-01-11 DIAGNOSIS — I509 Heart failure, unspecified: Secondary | ICD-10-CM | POA: Diagnosis not present

## 2016-01-11 DIAGNOSIS — R4689 Other symptoms and signs involving appearance and behavior: Secondary | ICD-10-CM | POA: Diagnosis present

## 2016-01-11 DIAGNOSIS — M329 Systemic lupus erythematosus, unspecified: Secondary | ICD-10-CM | POA: Diagnosis present

## 2016-01-11 DIAGNOSIS — F17210 Nicotine dependence, cigarettes, uncomplicated: Secondary | ICD-10-CM | POA: Insufficient documentation

## 2016-01-11 LAB — RENAL FUNCTION PANEL
Albumin: 3.2 g/dL — ABNORMAL LOW (ref 3.5–5.0)
Anion gap: 12 (ref 5–15)
BUN: 72 mg/dL — ABNORMAL HIGH (ref 6–20)
CHLORIDE: 96 mmol/L — AB (ref 101–111)
CO2: 24 mmol/L (ref 22–32)
CREATININE: 12.66 mg/dL — AB (ref 0.44–1.00)
Calcium: 9.5 mg/dL (ref 8.9–10.3)
GFR calc non Af Amer: 3 mL/min — ABNORMAL LOW (ref >60)
GFR, EST AFRICAN AMERICAN: 4 mL/min — AB (ref >60)
Glucose, Bld: 86 mg/dL (ref 65–99)
POTASSIUM: 5.4 mmol/L — AB (ref 3.5–5.1)
Phosphorus: 8.5 mg/dL — ABNORMAL HIGH (ref 2.5–4.6)
Sodium: 132 mmol/L — ABNORMAL LOW (ref 135–145)

## 2016-01-11 LAB — CBC
HCT: 26 % — ABNORMAL LOW (ref 36.0–46.0)
HEMOGLOBIN: 8.1 g/dL — AB (ref 12.0–15.0)
MCH: 25.8 pg — ABNORMAL LOW (ref 26.0–34.0)
MCHC: 31.2 g/dL (ref 30.0–36.0)
MCV: 82.8 fL (ref 78.0–100.0)
PLATELETS: 268 10*3/uL (ref 150–400)
RBC: 3.14 MIL/uL — AB (ref 3.87–5.11)
RDW: 18.4 % — ABNORMAL HIGH (ref 11.5–15.5)
WBC: 10.6 10*3/uL — ABNORMAL HIGH (ref 4.0–10.5)

## 2016-01-11 MED ORDER — HEPARIN SODIUM (PORCINE) 1000 UNIT/ML DIALYSIS
20.0000 [IU]/kg | INTRAMUSCULAR | Status: DC | PRN
  Administered 2016-01-11: 1300 [IU] via INTRAVENOUS_CENTRAL
  Filled 2016-01-11 (×2): qty 2

## 2016-01-11 MED ORDER — PENTAFLUOROPROP-TETRAFLUOROETH EX AERO: 1.0000 "application " | INHALATION_SPRAY | CUTANEOUS | Status: DC | PRN

## 2016-01-11 MED ORDER — HEPARIN SODIUM (PORCINE) 1000 UNIT/ML IJ SOLN
INTRAMUSCULAR | Status: AC
  Administered 2016-01-11: 1300 [IU] via INTRAVENOUS_CENTRAL
  Filled 2016-01-11: qty 5

## 2016-01-11 MED ORDER — DOXERCALCIFEROL 4 MCG/2ML IV SOLN
INTRAVENOUS | Status: AC
Start: 2016-01-11 — End: 2016-01-11
  Administered 2016-01-11: 5 ug via INTRAVENOUS
  Filled 2016-01-11: qty 4

## 2016-01-11 MED ORDER — SEVELAMER CARBONATE 800 MG PO TABS
ORAL_TABLET | ORAL | Status: AC
  Administered 2016-01-11: 3200 mg via ORAL
  Filled 2016-01-11: qty 4

## 2016-01-11 MED ORDER — HEPARIN SODIUM (PORCINE) 5000 UNIT/ML IJ SOLN: 5000.0000 [IU] | Freq: Three times a day (TID) | INTRAMUSCULAR | Status: DC

## 2016-01-11 MED ORDER — DIPHENHYDRAMINE HCL 25 MG PO CAPS
25.0000 mg | ORAL_CAPSULE | Freq: Four times a day (QID) | ORAL | Status: DC | PRN
  Administered 2016-01-11: 25 mg via ORAL

## 2016-01-11 MED ORDER — SODIUM CHLORIDE 0.9 % IV SOLN: 100.0000 mL | INTRAVENOUS | Status: DC | PRN

## 2016-01-11 MED ORDER — RISPERIDONE MICROSPHERES 25 MG IM SUSR: 25.0000 mg | INTRAMUSCULAR | Status: DC

## 2016-01-11 MED ORDER — EPOETIN ALFA 20000 UNIT/ML IJ SOLN
14000.0000 [IU] | INTRAMUSCULAR | Status: DC
  Administered 2016-01-11: 14000 [IU] via SUBCUTANEOUS
  Filled 2016-01-11 (×2): qty 1

## 2016-01-11 MED ORDER — LIDOCAINE HCL (PF) 1 % IJ SOLN
5.0000 mL | INTRAMUSCULAR | Status: DC | PRN
  Administered 2016-01-11: 0.5 mL via INTRADERMAL

## 2016-01-11 MED ORDER — TRAMADOL HCL 50 MG PO TABS
50.0000 mg | ORAL_TABLET | Freq: Four times a day (QID) | ORAL | Status: DC | PRN
  Administered 2016-01-11: 50 mg via ORAL

## 2016-01-11 MED ORDER — DIPHENHYDRAMINE HCL 25 MG PO CAPS
ORAL_CAPSULE | ORAL | Status: AC
  Administered 2016-01-11: 25 mg via ORAL
  Filled 2016-01-11: qty 1

## 2016-01-11 MED ORDER — METOPROLOL TARTRATE 50 MG PO TABS
50.0000 mg | ORAL_TABLET | Freq: Two times a day (BID) | ORAL | Status: DC
  Administered 2016-01-11: 50 mg via ORAL
  Filled 2016-01-11 (×2): qty 1

## 2016-01-11 MED ORDER — EPOETIN ALFA 20000 UNIT/ML IJ SOLN
INTRAMUSCULAR | Status: AC
Start: 2016-01-11 — End: 2016-01-11
  Administered 2016-01-11: 14000 [IU] via SUBCUTANEOUS
  Filled 2016-01-11: qty 1

## 2016-01-11 MED ORDER — FOLIC ACID 1 MG PO TABS
1.0000 mg | ORAL_TABLET | Freq: Every day | ORAL | Status: DC
  Administered 2016-01-11: 1 mg via ORAL
  Filled 2016-01-11 (×2): qty 1

## 2016-01-11 MED ORDER — HYDRALAZINE HCL 20 MG/ML IJ SOLN: 10.0000 mg | Freq: Four times a day (QID) | INTRAMUSCULAR | Status: DC | PRN

## 2016-01-11 MED ORDER — TRAMADOL HCL 50 MG PO TABS
ORAL_TABLET | ORAL | Status: AC
  Administered 2016-01-11: 50 mg via ORAL
  Filled 2016-01-11: qty 1

## 2016-01-11 MED ORDER — SEVELAMER CARBONATE 800 MG PO TABS
3200.0000 mg | ORAL_TABLET | Freq: Three times a day (TID) | ORAL | Status: DC
  Administered 2016-01-11: 3200 mg via ORAL
  Filled 2016-01-11 (×11): qty 4

## 2016-01-11 MED ORDER — PREDNISONE 20 MG PO TABS
20.0000 mg | ORAL_TABLET | Freq: Every day | ORAL | Status: DC
  Filled 2016-01-11 (×2): qty 1

## 2016-01-11 MED ORDER — DOXERCALCIFEROL 4 MCG/2ML IV SOLN
5.0000 ug | INTRAVENOUS | Status: DC
  Administered 2016-01-11: 5 ug via INTRAVENOUS
  Filled 2016-01-11 (×2): qty 4

## 2016-01-11 MED ORDER — LIDOCAINE HCL (PF) 1 % IJ SOLN
INTRAMUSCULAR | Status: AC
  Administered 2016-01-11: 0.5 mL via INTRADERMAL
  Filled 2016-01-11: qty 5

## 2016-01-11 MED ORDER — VITAMIN B-12 1000 MCG PO TABS
1000.0000 ug | ORAL_TABLET | Freq: Every day | ORAL | Status: DC
Start: 2016-01-11 — End: 2016-01-11
  Administered 2016-01-11: 1000 ug via ORAL
  Filled 2016-01-11 (×5): qty 1

## 2016-01-11 MED ORDER — SEVELAMER CARBONATE 800 MG PO TABS: 3200.0000 mg | ORAL_TABLET | Freq: Three times a day (TID) | ORAL | Status: DC
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
## 2016-01-11 NOTE — ED Triage Notes (Signed)
PT requesting dialysis today and denies any complaints. PT states last dialysis x4 days ago (01/07/16).
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
## 2016-01-14 ENCOUNTER — Encounter (HOSPITAL_COMMUNITY): Payer: Self-pay

## 2016-01-14 ENCOUNTER — Observation Stay (HOSPITAL_COMMUNITY)
Admission: EM | Admit: 2016-01-14 | Discharge: 2016-01-14 | Disposition: A | Discharge status: Home / Self Care | Payer: Medicare Other | Attending: Internal Medicine | Admitting: Internal Medicine

## 2016-01-14 DIAGNOSIS — I132 Hypertensive heart and chronic kidney disease with heart failure and with stage 5 chronic kidney disease, or end stage renal disease: Principal | ICD-10-CM | POA: Insufficient documentation

## 2016-01-14 DIAGNOSIS — Z992 Dependence on renal dialysis: Secondary | ICD-10-CM | POA: Insufficient documentation

## 2016-01-14 DIAGNOSIS — F17210 Nicotine dependence, cigarettes, uncomplicated: Secondary | ICD-10-CM | POA: Insufficient documentation

## 2016-01-14 DIAGNOSIS — I5022 Chronic systolic (congestive) heart failure: Secondary | ICD-10-CM | POA: Insufficient documentation

## 2016-01-14 DIAGNOSIS — I10 Essential (primary) hypertension: Secondary | ICD-10-CM | POA: Diagnosis not present

## 2016-01-14 DIAGNOSIS — N186 End stage renal disease: Secondary | ICD-10-CM | POA: Insufficient documentation

## 2016-01-14 DIAGNOSIS — I120 Hypertensive chronic kidney disease with stage 5 chronic kidney disease or end stage renal disease: Secondary | ICD-10-CM | POA: Diagnosis not present

## 2016-01-14 DIAGNOSIS — Z79899 Other long term (current) drug therapy: Secondary | ICD-10-CM | POA: Diagnosis not present

## 2016-01-14 DIAGNOSIS — D631 Anemia in chronic kidney disease: Secondary | ICD-10-CM | POA: Diagnosis not present

## 2016-01-14 DIAGNOSIS — E8770 Fluid overload, unspecified: Secondary | ICD-10-CM | POA: Diagnosis not present

## 2016-01-14 MED ORDER — SEVELAMER CARBONATE 800 MG PO TABS: 3200.0000 mg | ORAL_TABLET | Freq: Three times a day (TID) | ORAL | Status: DC

## 2016-01-14 MED ORDER — DOXERCALCIFEROL 4 MCG/2ML IV SOLN: 5.0000 ug | INTRAVENOUS | Status: DC

## 2016-01-14 MED ORDER — EPOETIN ALFA 20000 UNIT/ML IJ SOLN: 14000.0000 [IU] | INTRAMUSCULAR | Status: DC

## 2016-01-14 MED ORDER — DIPHENHYDRAMINE HCL 25 MG PO CAPS: 25.0000 mg | ORAL_CAPSULE | Freq: Four times a day (QID) | ORAL | Status: DC | PRN
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
## 2016-01-14 NOTE — ED Notes (Signed)
Pt given ice water and warm blankets.

## 2016-01-14 NOTE — ED Triage Notes (Signed)
Pt here for dialysis 

## 2016-01-16 ENCOUNTER — Encounter (HOSPITAL_COMMUNITY): Payer: Self-pay | Admitting: Emergency Medicine

## 2016-01-16 ENCOUNTER — Emergency Department (HOSPITAL_COMMUNITY)
Admission: EM | Admit: 2016-01-16 | Discharge: 2016-01-16 | Disposition: A | Discharge status: Home / Self Care | Payer: Medicare Other | Attending: Nephrology | Admitting: Nephrology

## 2016-01-16 DIAGNOSIS — F17210 Nicotine dependence, cigarettes, uncomplicated: Secondary | ICD-10-CM | POA: Insufficient documentation

## 2016-01-16 DIAGNOSIS — Z79899 Other long term (current) drug therapy: Secondary | ICD-10-CM | POA: Insufficient documentation

## 2016-01-16 DIAGNOSIS — D631 Anemia in chronic kidney disease: Secondary | ICD-10-CM | POA: Diagnosis not present

## 2016-01-16 DIAGNOSIS — N186 End stage renal disease: Secondary | ICD-10-CM | POA: Diagnosis not present

## 2016-01-16 DIAGNOSIS — Z992 Dependence on renal dialysis: Secondary | ICD-10-CM | POA: Diagnosis not present

## 2016-01-16 DIAGNOSIS — I132 Hypertensive heart and chronic kidney disease with heart failure and with stage 5 chronic kidney disease, or end stage renal disease: Secondary | ICD-10-CM | POA: Diagnosis not present

## 2016-01-16 DIAGNOSIS — I120 Hypertensive chronic kidney disease with stage 5 chronic kidney disease or end stage renal disease: Secondary | ICD-10-CM | POA: Diagnosis not present

## 2016-01-16 DIAGNOSIS — I5022 Chronic systolic (congestive) heart failure: Secondary | ICD-10-CM | POA: Diagnosis not present

## 2016-01-16 DIAGNOSIS — I10 Essential (primary) hypertension: Secondary | ICD-10-CM | POA: Diagnosis not present

## 2016-01-16 MED ORDER — SODIUM CHLORIDE 0.9 % IV SOLN: 100.0000 mL | INTRAVENOUS | Status: DC | PRN

## 2016-01-16 MED ORDER — ALTEPLASE 2 MG IJ SOLR: 2.0000 mg | Freq: Once | INTRAMUSCULAR | Status: DC | PRN

## 2016-01-16 MED ORDER — DIPHENHYDRAMINE HCL 25 MG PO CAPS
25.0000 mg | ORAL_CAPSULE | Freq: Once | ORAL | Status: AC
  Administered 2016-01-16: 25 mg via ORAL

## 2016-01-16 MED ORDER — HEPARIN SODIUM (PORCINE) 1000 UNIT/ML DIALYSIS: 1000.0000 [IU] | INTRAMUSCULAR | Status: DC | PRN

## 2016-01-16 MED ORDER — LIDOCAINE-PRILOCAINE 2.5-2.5 % EX CREA: 1.0000 "application " | TOPICAL_CREAM | CUTANEOUS | Status: DC | PRN

## 2016-01-16 MED ORDER — LIDOCAINE HCL (PF) 1 % IJ SOLN: 5.0000 mL | INTRAMUSCULAR | Status: DC | PRN

## 2016-01-16 MED ORDER — EPOETIN ALFA 4000 UNIT/ML IJ SOLN
4000.0000 [IU] | INTRAMUSCULAR | Status: DC
  Administered 2016-01-16: 4000 [IU] via SUBCUTANEOUS

## 2016-01-16 MED ORDER — HEPARIN SODIUM (PORCINE) 1000 UNIT/ML DIALYSIS: 20.0000 [IU]/kg | INTRAMUSCULAR | Status: DC | PRN

## 2016-01-16 MED ORDER — PENTAFLUOROPROP-TETRAFLUOROETH EX AERO: 1.0000 "application " | INHALATION_SPRAY | CUTANEOUS | Status: DC | PRN

## 2016-01-16 NOTE — Consult Note (Signed)
Sara [name] MRN: [id number] DOB/AGE: March 31, 1981 35 y.o. Primary Care Physician:Pcp Not In System Admit date: 01/16/2016 Chief Complaint:  Chief Complaint  Patient presents with  . Vascular Access Problem   HPI: Pt is 35 year old female with past medical hx of ESRD who was admitted with c/o dyspnea," I need dialysis "  HPI dates 2-3 days ago as pt came to ER asking for her dialysis. Pt says " I need my dialysis .". No c/o fever/cough/chills NO c/o nausea/vomiting. NO c/o abdominal pain. No c/o hematuria . NO c/o syncope.     Past Medical History:  Diagnosis Date  . Acute myopericarditis    hx/notes 10/09/2014  . Bipolar disorder ([place])    [name] 10/09/2014  . CHF (congestive heart failure) ([place])    systolic/notes [id number]  . Chronic anemia    [name] 10/09/2014  . Current use of steroid medication 12/21/2015  . ESRD (end stage renal disease) on dialysis [facility])    "[name]; [facility]" (10/09/2014)  . History of blood transfusion    "this is probably my 3rd" (10/09/2014)  . Hypertension   . Low back pain   . Lupus    lupus w nephritis  . 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 Non-compliant patient   . Positive ANA (antinuclear antibody) 08/16/2012  . Positive Smith antibody 08/16/2012  . Pregnancy   . Psychosis   . Schizoaffective disorder, bipolar type ([place]) 11/20/2014  . Schizophrenia ([place])    [name] 10/09/2014  . Tobacco abuse 02/20/2014        Family History  Problem Relation Age of Onset  . Drug abuse Father   . Kidney disease Father     Social History:  reports that she has been smoking Cigarettes.  She has a 2.00 pack-year smoking history. She has never used smokeless tobacco. She reports that she does not drink alcohol or use drugs.   Allergies:  Allergies  Allergen Reactions  . Ativan [Lorazepam] Swelling and Other (See Comments)    Dysarthria(patient has difficulty speaking and slurred speech); denies swelling, itching, pain, or numbness.  [name] [Ziprasidone Hcl]  Itching and Swelling    Tongue swelling  . Keflex [Cephalexin] Swelling and Other (See Comments)    Tongue swelling. Can't talk   . Haldol [Haloperidol Lactate] Swelling    Tongue swelling. 05/31/15 - MD ok with giving as pt has tolerated in the past Pt can take benadryl.  . Other Itching    wool     (Not in a hospital admission)     [id number] from the symptoms mentioned above,there are no other symptoms referable to all systems reviewed.      Physical Exam: Vital signs in last 24 hours: Temp:  [97.8 F (36.6 C)] 97.8 F (36.6 C) (10/11 0740) Pulse Rate:  [85] 85 (10/11 0732) Resp:  [18] 18 (10/11 0732) BP: (180)/(126) 180/126 (10/11 0732) SpO2:  [100 %] 100 % (10/11 0732) Weight:  [150 lb (68 kg)] 150 lb (68 kg) (10/11 0735) Weight change:     Intake/Output from previous day: No intake/output data recorded. No intake/output data recorded.   Physical Exam: General- pt is awake,alert, follows comands Resp- No acute REsp distress, chest clear to asuculatation CVS- S1S2 regular in rate and rhythm, NO rubs  GIT- BS+, soft, NT, ND EXT- No LE Edema, NO Cyanosis CNS- CN 2-12 grossly intact. Moving all 4 extremities Access- AVF+    Lab Results:  CBC    Component Value Date/Time   WBC  10.6 (H) 01/11/2016 1156   RBC 3.14 (L) 01/11/2016 1156   HGB 8.1 (L) 01/11/2016 1156   HCT 26.0 (L) 01/11/2016 1156   PLT 268 01/11/2016 1156   MCV 82.8 01/11/2016 1156   MCH 25.8 (L) 01/11/2016 1156   MCHC 31.2 01/11/2016 1156   RDW 18.4 (H) 01/11/2016 1156   LYMPHSABS 2.2 12/21/2015 0928   MONOABS 0.7 12/21/2015 0928   EOSABS 0.3 12/21/2015 0928   BASOSABS 0.0 12/21/2015 0928      BMET BMP Latest Ref Rng & Units 01/11/2016 01/07/2016 01/04/2016  Glucose 65 - 99 mg/dL 86 79 89  BUN 6 - 20 mg/dL 72(H) 68(H) 62(H)  Creatinine 0.44 - 1.00 mg/dL 12.66(H) 10.89(H) 11.40(H)  Sodium 135 - 145 mmol/L 132(L) 132(L) 131(L)  Potassium 3.5 - 5.1 mmol/L 5.4(H) 4.5 4.6  Chloride  101 - 111 mmol/L 96(L) 97(L) 95(L)  CO2 22 - 32 mmol/L 24 23 24   Calcium 8.9 - 10.3 mg/dL 9.5 9.5 9.3         Lab Results  Component Value Date   PTH 1,100 (H) 11/19/2015   PTH Comment 11/19/2015   CALCIUM 9.5 01/11/2016   CAION 1.18 12/17/2015   PHOS 8.5 (H) 01/11/2016      Impression: 1)Renal  ESRD on HD                Pt is not on regular  Schedule sec to her complaince/adherence issues                Will dialyze pt today  2)HTN Bp is not at goal  3)Anemia In ESRD the goal for HGb is 9--11. 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   Not at goal sec to non adherence to hd tx   Will keep on zemplar/Hectrol  Phosphorus not at goal.  on binders  5)Psych .  Hx of   psycosis Schizophrenia Primary MD following  6)Electrolytes  Hx of Hyperkalemia   Hx of Hyponatremia   7)Acid base Co2  at goal    Plan:  Will dialyze today Will use 2 k bath Will keep on epo-4000 units  Will try to take 2.5 liters off if possible   Sara [name] S 01/16/2016, 8:52 AM
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
## 2016-01-16 NOTE — ED Notes (Signed)
Nephrologist at bedside

## 2016-01-16 NOTE — ED Triage Notes (Signed)
Pt reports needs dialysis. Pt reports last dialyzed on 01/11/16.

## 2016-01-16 NOTE — ED Notes (Signed)
Pt states she was leaving she has been here since 0730 and she was getting impatient. Pt was made aware earlier that the dialysis nurse was on his way and when he got the room ready he would let us know.

## 2016-01-16 NOTE — ED Notes (Signed)
Pt states she cannot stay any longer than 5 pm because she will not have a ride after that

## 2016-01-16 NOTE — ED Notes (Signed)
Called pt for room. Registration staff reported pt went to cafeteria. Primary RN aware.

## 2016-01-18 ENCOUNTER — Observation Stay (HOSPITAL_COMMUNITY)
Admission: EM | Admit: 2016-01-18 | Discharge: 2016-01-18 | Disposition: A | Discharge status: Home / Self Care | Payer: Medicare Other | Attending: Internal Medicine | Admitting: Internal Medicine

## 2016-01-18 ENCOUNTER — Encounter (HOSPITAL_COMMUNITY): Payer: Self-pay | Admitting: *Deleted

## 2016-01-18 DIAGNOSIS — I5022 Chronic systolic (congestive) heart failure: Secondary | ICD-10-CM | POA: Diagnosis not present

## 2016-01-18 DIAGNOSIS — Z7952 Long term (current) use of systemic steroids: Secondary | ICD-10-CM

## 2016-01-18 DIAGNOSIS — I10 Essential (primary) hypertension: Secondary | ICD-10-CM | POA: Diagnosis not present

## 2016-01-18 DIAGNOSIS — F17210 Nicotine dependence, cigarettes, uncomplicated: Secondary | ICD-10-CM | POA: Diagnosis not present

## 2016-01-18 DIAGNOSIS — N186 End stage renal disease: Secondary | ICD-10-CM | POA: Diagnosis not present

## 2016-01-18 DIAGNOSIS — Z79899 Other long term (current) drug therapy: Secondary | ICD-10-CM | POA: Diagnosis not present

## 2016-01-18 DIAGNOSIS — M329 Systemic lupus erythematosus, unspecified: Secondary | ICD-10-CM | POA: Diagnosis present

## 2016-01-18 DIAGNOSIS — I132 Hypertensive heart and chronic kidney disease with heart failure and with stage 5 chronic kidney disease, or end stage renal disease: Principal | ICD-10-CM | POA: Insufficient documentation

## 2016-01-18 DIAGNOSIS — Z720 Tobacco use: Secondary | ICD-10-CM

## 2016-01-18 DIAGNOSIS — M3214 Glomerular disease in systemic lupus erythematosus: Secondary | ICD-10-CM | POA: Diagnosis not present

## 2016-01-18 DIAGNOSIS — D631 Anemia in chronic kidney disease: Secondary | ICD-10-CM | POA: Diagnosis not present

## 2016-01-18 DIAGNOSIS — Z992 Dependence on renal dialysis: Secondary | ICD-10-CM | POA: Diagnosis not present

## 2016-01-18 LAB — RENAL FUNCTION PANEL
Albumin: 3.3 g/dL — ABNORMAL LOW (ref 3.5–5.0)
Anion gap: 14 (ref 5–15)
BUN: 82 mg/dL — ABNORMAL HIGH (ref 6–20)
CO2: 23 mmol/L (ref 22–32)
Calcium: 9.4 mg/dL (ref 8.9–10.3)
Chloride: 97 mmol/L — ABNORMAL LOW (ref 101–111)
Creatinine, Ser: 12.68 mg/dL — ABNORMAL HIGH (ref 0.44–1.00)
GFR calc Af Amer: 4 mL/min — ABNORMAL LOW (ref >60)
GFR calc non Af Amer: 3 mL/min — ABNORMAL LOW (ref >60)
Glucose, Bld: 126 mg/dL — ABNORMAL HIGH (ref 65–99)
Phosphorus: 8.4 mg/dL — ABNORMAL HIGH (ref 2.5–4.6)
Potassium: 4.9 mmol/L (ref 3.5–5.1)
Sodium: 134 mmol/L — ABNORMAL LOW (ref 135–145)

## 2016-01-18 LAB — CBC
HCT: 25.9 % — ABNORMAL LOW (ref 36.0–46.0)
Hemoglobin: 8.3 g/dL — ABNORMAL LOW (ref 12.0–15.0)
MCH: 26 pg (ref 26.0–34.0)
MCHC: 32 g/dL (ref 30.0–36.0)
MCV: 81.2 fL (ref 78.0–100.0)
Platelets: 277 10*3/uL (ref 150–400)
RBC: 3.19 MIL/uL — ABNORMAL LOW (ref 3.87–5.11)
RDW: 18.4 % — ABNORMAL HIGH (ref 11.5–15.5)
WBC: 6.3 10*3/uL (ref 4.0–10.5)

## 2016-01-18 LAB — PROTIME-INR
INR: 1.36
PROTHROMBIN TIME: 16.9 s — AB (ref 11.4–15.2)

## 2016-01-18 MED ORDER — HYDROCODONE-ACETAMINOPHEN 5-325 MG PO TABS: 1.0000 | ORAL_TABLET | ORAL | Status: DC | PRN

## 2016-01-18 MED ORDER — PENTAFLUOROPROP-TETRAFLUOROETH EX AERO: 1.0000 "application " | INHALATION_SPRAY | CUTANEOUS | Status: DC | PRN

## 2016-01-18 MED ORDER — SEVELAMER CARBONATE 800 MG PO TABS
3200.0000 mg | ORAL_TABLET | Freq: Three times a day (TID) | ORAL | Status: DC
  Administered 2016-01-18: 3200 mg via ORAL
  Filled 2016-01-18: qty 4

## 2016-01-18 MED ORDER — EPOETIN ALFA 4000 UNIT/ML IJ SOLN
INTRAMUSCULAR | Status: AC
  Filled 2016-01-18: qty 1

## 2016-01-18 MED ORDER — PREDNISONE 20 MG PO TABS: 20.0000 mg | ORAL_TABLET | Freq: Every day | ORAL | Status: DC

## 2016-01-18 MED ORDER — FOLIC ACID 1 MG PO TABS: 1.0000 mg | ORAL_TABLET | Freq: Every day | ORAL | Status: DC

## 2016-01-18 MED ORDER — EPOETIN ALFA 10000 UNIT/ML IJ SOLN: 8000.0000 [IU] | Freq: Once | INTRAMUSCULAR | Status: AC

## 2016-01-18 MED ORDER — LIDOCAINE HCL (PF) 1 % IJ SOLN
INTRAMUSCULAR | Status: AC
  Administered 2016-01-18: 0.5 mL via INTRADERMAL
  Filled 2016-01-18: qty 5

## 2016-01-18 MED ORDER — ONDANSETRON HCL 4 MG PO TABS: 4.0000 mg | ORAL_TABLET | Freq: Four times a day (QID) | ORAL | Status: DC | PRN

## 2016-01-18 MED ORDER — HEPARIN SODIUM (PORCINE) 1000 UNIT/ML DIALYSIS: 20.0000 [IU]/kg | INTRAMUSCULAR | Status: DC | PRN

## 2016-01-18 MED ORDER — SODIUM CHLORIDE 0.9 % IV SOLN: INTRAVENOUS | Status: DC

## 2016-01-18 MED ORDER — HEPARIN SODIUM (PORCINE) 5000 UNIT/ML IJ SOLN: 5000.0000 [IU] | Freq: Three times a day (TID) | INTRAMUSCULAR | Status: DC

## 2016-01-18 MED ORDER — TRAMADOL HCL 50 MG PO TABS: 50.0000 mg | ORAL_TABLET | Freq: Four times a day (QID) | ORAL | Status: DC | PRN

## 2016-01-18 MED ORDER — ONDANSETRON HCL 4 MG/2ML IJ SOLN: 4.0000 mg | Freq: Four times a day (QID) | INTRAMUSCULAR | Status: DC | PRN

## 2016-01-18 MED ORDER — METOPROLOL TARTRATE 50 MG PO TABS
50.0000 mg | ORAL_TABLET | Freq: Two times a day (BID) | ORAL | Status: DC
  Administered 2016-01-18: 50 mg via ORAL
  Filled 2016-01-18: qty 1

## 2016-01-18 MED ORDER — ALTEPLASE 2 MG IJ SOLR
2.0000 mg | Freq: Once | INTRAMUSCULAR | Status: DC | PRN
Start: 2016-01-18 — End: 2016-01-18

## 2016-01-18 MED ORDER — EPOETIN ALFA 4000 UNIT/ML IJ SOLN
INTRAMUSCULAR | Status: AC
  Administered 2016-01-18: 8000 [IU]
  Filled 2016-01-18: qty 1

## 2016-01-18 MED ORDER — ACETAMINOPHEN 500 MG PO TABS: 1000.0000 mg | ORAL_TABLET | Freq: Four times a day (QID) | ORAL | Status: DC | PRN

## 2016-01-18 MED ORDER — SODIUM CHLORIDE 0.9 % IV SOLN: 100.0000 mL | INTRAVENOUS | Status: DC | PRN

## 2016-01-18 MED ORDER — FUROSEMIDE 40 MG PO TABS
120.0000 mg | ORAL_TABLET | Freq: Every day | ORAL | Status: DC
  Administered 2016-01-18: 120 mg via ORAL
  Filled 2016-01-18: qty 1

## 2016-01-18 MED ORDER — HEPARIN SODIUM (PORCINE) 1000 UNIT/ML DIALYSIS: 1000.0000 [IU] | INTRAMUSCULAR | Status: DC | PRN

## 2016-01-18 MED ORDER — RISPERIDONE MICROSPHERES 25 MG IM SUSR: 25.0000 mg | INTRAMUSCULAR | Status: DC

## 2016-01-18 MED ORDER — LIDOCAINE-PRILOCAINE 2.5-2.5 % EX CREA
1.0000 | TOPICAL_CREAM | CUTANEOUS | Status: DC | PRN
Start: 2016-01-18 — End: 2016-01-18

## 2016-01-18 MED ORDER — LIDOCAINE HCL (PF) 1 % IJ SOLN
5.0000 mL | INTRAMUSCULAR | Status: DC | PRN
  Administered 2016-01-18: 0.5 mL via INTRADERMAL

## 2016-01-18 NOTE — ED Notes (Signed)
Pt Given a Renal breakfast tray earlier.  Waiting on bed assignment.  Dialysis nurse was called as he requested, to let him know that Pt was here.[name]). He will be in latter to do the Dialysis.  Pts nurse informed.

## 2016-01-18 NOTE — ED Notes (Signed)
Pt refused IV  
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
## 2016-01-18 NOTE — Discharge Summary (Signed)
Physician Discharge Summary  Sara [name] [name] DOB: [birth year] DOA: 01/18/2016  PCP: Pcp Not In System  Admit date: 01/18/2016 Discharge date: 01/18/2016  Admitted From: Home Disposition: Home  Recommendations for Outpatient Follow-up:  1. Follow up with PCP in 1-2 weeks 2. Please obtain BMP/CBC in one week 3.  Home Health:NA Equipment/Devices: NA  Discharge Condition: Stable CODE STATUS: Full Diet recommendation: Heart Healthy  Brief/Interim Summary: Sara [name] is a 35 y.o. female with medical history significant of lupus, has ESRD on hemodialysis. Patient lives in [place] but she cannot be dialyzed over there. She came in to the ED for further hemodialysis.  Discharge Diagnoses:  Principal Problem:   Admission for dialysis [facility]) Active Problems:   Tobacco abuse   Lupus (systemic lupus erythematosus) ([place])   Current use of steroid medication   ESRD on hemodialysis -Patient is here for elective dialysis, cannot be dialyzed as outpatient. -Nephrology consulted, Dr. Theador Hawthorne, Dialysis was done -Patient discharged home in good condition, to come back for next dialysis.  History of lupus -Her prednisone continued instructed to continue that.  Tobacco abuse -Patient smokes one pack per day, they have done extensive counseling personally.  Chronic systolic CHF -LVEF is [id number], compensated, continue home medications.  Schizophrenia -On Risperdal Consta, follow with psych as outpatient.   Discharge Instructions     Medication List    TAKE these medications   acetaminophen 500 MG tablet Commonly known as:  TYLENOL Take 1,000 mg by mouth every 6 (six) hours as needed for moderate pain. Reported on [id number]   folic acid 1 MG tablet Commonly known as:  FOLVITE Take 1 tablet (1 mg total) by mouth daily.   furosemide 40 MG tablet Commonly known as:  LASIX Take 3 tablets (120 mg total) by mouth daily.   metoprolol 50 MG  tablet Commonly known as:  LOPRESSOR Take 1 tablet (50 mg total) by mouth 2 (two) times daily.   predniSONE 20 MG tablet Commonly known as:  DELTASONE Take 1 tablet (20 mg total) by mouth daily with breakfast.   risperiDONE microspheres 25 MG injection Commonly known as:  RISPERDAL CONSTA Inject 25 mg into the muscle every 14 (fourteen) days.   sevelamer carbonate 800 MG tablet Commonly known as:  RENVELA Take 4 tablets (3,200 mg total) by mouth 3 (three) times daily with meals.   traMADol 50 MG tablet Commonly known as:  ULTRAM Take 1 tablet (50 mg total) by mouth every 6 (six) hours as needed for moderate pain.       Allergies  Allergen Reactions  . Ativan [Lorazepam] Swelling and Other (See Comments)    Dysarthria(patient has difficulty speaking and slurred speech); denies swelling, itching, pain, or numbness.  [name] [Ziprasidone Hcl] Itching and Swelling    Tongue swelling  . Keflex [Cephalexin] Swelling and Other (See Comments)    Tongue swelling. Can't talk   . Haldol [Haloperidol Lactate] Swelling    Tongue swelling. 05/31/15 - MD ok with giving as pt has tolerated in the past Pt can take benadryl.  . Other Itching    wool    Consultations: Nephrology  Procedures/Studies: No results found. (Echo, Carotid, EGD, Colonoscopy, ERCP)    Subjective:   Discharge Exam: Vitals:   01/18/16 0718 01/18/16 1020  BP: (!) 164/114 (!) 180/119  Pulse: 119 (!) 118  Resp: 18 20  Temp: 97.8 F (36.6 C) 98.6 F (37 C)   Vitals:   01/18/16 0718 01/18/16 0718 01/18/16 1020  BP:  (!) 164/114 (!) 180/119  Pulse:  119 (!) 118  Resp:  18 20  Temp:  97.8 F (36.6 C) 98.6 F (37 C)  TempSrc:  Oral Oral  SpO2:  100% 100%  Weight: 66.2 kg (146 lb)    Height: 5\' 7"  (1.702 m)  5\' 7"  (1.702 m)    General: Pt is alert, awake, not in acute distress Cardiovascular: RRR, S1/S2 +, no rubs, no gallops Respiratory: CTA bilaterally, no wheezing, no rhonchi Abdominal: Soft,  NT, ND, bowel sounds + Extremities: no edema, no cyanosis    The results of significant diagnostics from this hospitalization (including imaging, microbiology, ancillary and laboratory) are listed below for reference.     Microbiology: No results found for this or any previous visit (from the past 240 hour(s)).   Labs: BNP (last 3 results) No results for input(s): BNP in the last 8760 hours. Basic Metabolic Panel:  Recent Labs Lab 01/11/16 1156  NA 132*  K 5.4*  CL 96*  CO2 24  GLUCOSE 86  BUN 72*  CREATININE 12.66*  CALCIUM 9.5  PHOS 8.5*   Liver Function Tests:  Recent Labs Lab 01/11/16 1156  ALBUMIN 3.2*   No results for input(s): LIPASE, AMYLASE in the last 168 hours. No results for input(s): AMMONIA in the last 168 hours. CBC:  Recent Labs Lab 01/11/16 1156  WBC 10.6*  HGB 8.1*  HCT 26.0*  MCV 82.8  PLT 268   Cardiac Enzymes: No results for input(s): CKTOTAL, CKMB, CKMBINDEX, TROPONINI in the last 168 hours. BNP: Invalid input(s): POCBNP CBG: No results for input(s): GLUCAP in the last 168 hours. D-Dimer No results for input(s): DDIMER in the last 72 hours. Hgb A1c No results for input(s): HGBA1C in the last 72 hours. Lipid Profile No results for input(s): CHOL, HDL, LDLCALC, TRIG, CHOLHDL, LDLDIRECT in the last 72 hours. Thyroid function studies No results for input(s): TSH, T4TOTAL, T3FREE, THYROIDAB in the last 72 hours.  Invalid input(s): FREET3 Anemia work up No results for input(s): VITAMINB12, FOLATE, FERRITIN, TIBC, IRON, RETICCTPCT in the last 72 hours. Urinalysis    Component Value Date/Time   COLORURINE YELLOW 08/11/2015 1235   APPEARANCEUR HAZY (A) 08/11/2015 1235   LABSPEC 1.015 08/11/2015 1235   PHURINE 7.5 08/11/2015 1235   GLUCOSEU 100 (A) 08/11/2015 1235   HGBUR SMALL (A) 08/11/2015 1235   BILIRUBINUR NEGATIVE 08/11/2015 1235   KETONESUR NEGATIVE 08/11/2015 1235   PROTEINUR >300 (A) 08/11/2015 1235   UROBILINOGEN 0.2  01/09/2015 1645   NITRITE NEGATIVE 08/11/2015 1235   LEUKOCYTESUR SMALL (A) 08/11/2015 1235   Sepsis Labs Invalid input(s): PROCALCITONIN,  WBC,  LACTICIDVEN Microbiology No results found for this or any previous visit (from the past 240 hour(s)).   Time coordinating discharge: Over 30 minutes  SIGNED:   Birdie Hopes, MD  [facility] 01/18/2016, 11:34 AM Pager   If 7PM-7AM, please contact night-coverage [email or web address] Password [name]

## 2016-01-18 NOTE — Consult Note (Signed)
LOYS CWIKLA MRN: [id number] DOB/AGE: 1980/06/26 35 y.o. Primary Care Physician:Pcp Not In System Admit date: 01/18/2016 Chief Complaint:  Chief Complaint  Patient presents with  . Dialysis   HPI: Pt is 35 year old female with past medical hx of ESRD who was admitted with c/o dyspnea," I need dialysis "  HPI dates 2days ago as pt came to ER asking for her dialysis. Pt says " I need my dialysis .". No c/o fever/cough/chills NO c/o nausea/vomiting. NO c/o abdominal pain. No c/o hematuria . NO c/o syncope.     Past Medical History:  Diagnosis Date  . Acute myopericarditis    hx/notes 10/09/2014  . Bipolar disorder ([place])    [name] 10/09/2014  . CHF (congestive heart failure) ([place])    systolic/notes [id number]  . Chronic anemia    [name] 10/09/2014  . Current use of steroid medication 12/21/2015  . ESRD (end stage renal disease) on dialysis [facility])    "[name]; [facility]" (10/09/2014)  . History of blood transfusion    "this is probably my 3rd" (10/09/2014)  . Hypertension   . Low back pain   . Lupus    lupus w nephritis  . 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 Non-compliant patient   . Positive ANA (antinuclear antibody) 08/16/2012  . Positive Smith antibody 08/16/2012  . Pregnancy   . Psychosis   . Schizoaffective disorder, bipolar type ([place]) 11/20/2014  . Schizophrenia ([place])    [name] 10/09/2014  . Tobacco abuse 02/20/2014        Family History  Problem Relation Age of Onset  . Drug abuse Father   . Kidney disease Father     Social History:  reports that she has been smoking Cigarettes.  She has a 2.00 pack-year smoking history. She has never used smokeless tobacco. She reports that she does not drink alcohol or use drugs.   Allergies:  Allergies  Allergen Reactions  . Ativan [Lorazepam] Swelling and Other (See Comments)    Dysarthria(patient has difficulty speaking and slurred speech); denies swelling, itching, pain, or numbness.  [name] [Ziprasidone Hcl] Itching and  Swelling    Tongue swelling  . Keflex [Cephalexin] Swelling and Other (See Comments)    Tongue swelling. Can't talk   . Haldol [Haloperidol Lactate] Swelling    Tongue swelling. 05/31/15 - MD ok with giving as pt has tolerated in the past Pt can take benadryl.  . Other Itching    wool     (Not in a hospital admission)     [id number] from the symptoms mentioned above,there are no other symptoms referable to all systems reviewed.      Physical Exam: Vital signs in last 24 hours: Temp:  [97.8 F (36.6 C)] 97.8 F (36.6 C) (10/13 0718) Pulse Rate:  [119] 119 (10/13 0718) Resp:  [18] 18 (10/13 0718) BP: (164)/(114) 164/114 (10/13 0718) SpO2:  [100 %] 100 % (10/13 0718) Weight:  [146 lb (66.2 kg)] 146 lb (66.2 kg) (10/13 0718) Weight change:     Intake/Output from previous day: No intake/output data recorded. No intake/output data recorded.   Physical Exam: General- pt is awake,alert, follows comands Resp- No acute REsp distress, chest clear to asuculatation CVS- S1S2 regular in rate and rhythm, NO rubs  GIT- BS+, soft, NT, ND EXT- No LE Edema, NO Cyanosis CNS- CN 2-12 grossly intact. Moving all 4 extremities Access- AVF+    Lab Results:  CBC    Component Value Date/Time   WBC 10.6 (H) 01/11/2016  1156   RBC 3.14 (L) 01/11/2016 1156   HGB 8.1 (L) 01/11/2016 1156   HCT 26.0 (L) 01/11/2016 1156   PLT 268 01/11/2016 1156   MCV 82.8 01/11/2016 1156   MCH 25.8 (L) 01/11/2016 1156   MCHC 31.2 01/11/2016 1156   RDW 18.4 (H) 01/11/2016 1156   LYMPHSABS 2.2 12/21/2015 0928   MONOABS 0.7 12/21/2015 0928   EOSABS 0.3 12/21/2015 0928   BASOSABS 0.0 12/21/2015 0928      BMET BMP Latest Ref Rng & Units 01/11/2016 01/07/2016 01/04/2016  Glucose 65 - 99 mg/dL 86 79 89  BUN 6 - 20 mg/dL 72(H) 68(H) 62(H)  Creatinine 0.44 - 1.00 mg/dL 12.66(H) 10.89(H) 11.40(H)  Sodium 135 - 145 mmol/L 132(L) 132(L) 131(L)  Potassium 3.5 - 5.1 mmol/L 5.4(H) 4.5 4.6  Chloride 101 -  111 mmol/L 96(L) 97(L) 95(L)  CO2 22 - 32 mmol/L 24 23 24   Calcium 8.9 - 10.3 mg/dL 9.5 9.5 9.3         Lab Results  Component Value Date   PTH 1,100 (H) 11/19/2015   PTH Comment 11/19/2015   CALCIUM 9.5 01/11/2016   CAION 1.18 12/17/2015   PHOS 8.5 (H) 01/11/2016      Impression: 1)Renal  ESRD on HD                Pt is not on regular  Schedule sec to her complaince/adherence issues                Will dialyze pt today  2)HTN Bp is not at goal  3)Anemia In ESRD the goal for HGb is 9--11. 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   Not at goal sec to non adherence to hd tx   Will keep on zemplar/Hectrol  Phosphorus not at goal.  on binders  5)Psych .  Hx of   psycosis Schizophrenia Primary MD following  6)Electrolytes  Hx of Hyperkalemia   Hx of Hyponatremia   7)Acid base Co2  at goal    Plan:  Will dialyze today Will use 2 k bath Will keep on epo-4000 units  Will try to take 2.5 liters off if possible   Romona Murdy S 01/18/2016, 8:49 AM

## 2016-01-18 NOTE — Progress Notes (Signed)
Patient finished dialysis, then left AMA. Dr. Hartford Poli aware.
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
## 2016-01-18 NOTE — ED Triage Notes (Signed)
Pt comes in for dialysis. Last got dialysis on Wednesday.

## 2016-01-20 LAB — HEMOGLOBIN A1C
HEMOGLOBIN A1C: 5.3 % (ref 4.8–5.6)
MEAN PLASMA GLUCOSE: 105 mg/dL

## 2016-01-21 ENCOUNTER — Encounter (HOSPITAL_COMMUNITY): Payer: Self-pay | Admitting: Emergency Medicine

## 2016-01-21 ENCOUNTER — Observation Stay (HOSPITAL_COMMUNITY)
Admission: EM | Admit: 2016-01-21 | Discharge: 2016-01-21 | Disposition: A | Discharge status: Home / Self Care | Payer: Medicare Other | Attending: Internal Medicine | Admitting: Internal Medicine

## 2016-01-21 DIAGNOSIS — N186 End stage renal disease: Secondary | ICD-10-CM

## 2016-01-21 DIAGNOSIS — D631 Anemia in chronic kidney disease: Secondary | ICD-10-CM | POA: Diagnosis not present

## 2016-01-21 DIAGNOSIS — Z79899 Other long term (current) drug therapy: Secondary | ICD-10-CM | POA: Insufficient documentation

## 2016-01-21 DIAGNOSIS — F17210 Nicotine dependence, cigarettes, uncomplicated: Secondary | ICD-10-CM | POA: Diagnosis not present

## 2016-01-21 DIAGNOSIS — Z992 Dependence on renal dialysis: Secondary | ICD-10-CM

## 2016-01-21 DIAGNOSIS — Z7952 Long term (current) use of systemic steroids: Secondary | ICD-10-CM | POA: Insufficient documentation

## 2016-01-21 DIAGNOSIS — I5022 Chronic systolic (congestive) heart failure: Secondary | ICD-10-CM | POA: Diagnosis not present

## 2016-01-21 DIAGNOSIS — F250 Schizoaffective disorder, bipolar type: Secondary | ICD-10-CM | POA: Insufficient documentation

## 2016-01-21 DIAGNOSIS — Z9119 Patient's noncompliance with other medical treatment and regimen: Secondary | ICD-10-CM | POA: Diagnosis not present

## 2016-01-21 DIAGNOSIS — E8770 Fluid overload, unspecified: Secondary | ICD-10-CM | POA: Diagnosis not present

## 2016-01-21 DIAGNOSIS — I120 Hypertensive chronic kidney disease with stage 5 chronic kidney disease or end stage renal disease: Secondary | ICD-10-CM | POA: Diagnosis not present

## 2016-01-21 DIAGNOSIS — I10 Essential (primary) hypertension: Secondary | ICD-10-CM | POA: Diagnosis not present

## 2016-01-21 DIAGNOSIS — I132 Hypertensive heart and chronic kidney disease with heart failure and with stage 5 chronic kidney disease, or end stage renal disease: Secondary | ICD-10-CM | POA: Diagnosis not present

## 2016-01-21 DIAGNOSIS — M3214 Glomerular disease in systemic lupus erythematosus: Secondary | ICD-10-CM | POA: Diagnosis not present

## 2016-01-21 LAB — RENAL FUNCTION PANEL
Albumin: 3.2 g/dL — ABNORMAL LOW (ref 3.5–5.0)
Anion gap: 17 — ABNORMAL HIGH (ref 5–15)
BUN: 104 mg/dL — ABNORMAL HIGH (ref 6–20)
CO2: 20 mmol/L — ABNORMAL LOW (ref 22–32)
Calcium: 9.3 mg/dL (ref 8.9–10.3)
Chloride: 94 mmol/L — ABNORMAL LOW (ref 101–111)
Creatinine, Ser: 16.82 mg/dL — ABNORMAL HIGH (ref 0.44–1.00)
GFR calc Af Amer: 3 mL/min — ABNORMAL LOW (ref >60)
GFR calc non Af Amer: 2 mL/min — ABNORMAL LOW (ref >60)
Glucose, Bld: 101 mg/dL — ABNORMAL HIGH (ref 65–99)
Phosphorus: 10 mg/dL — ABNORMAL HIGH (ref 2.5–4.6)
Potassium: 5.2 mmol/L — ABNORMAL HIGH (ref 3.5–5.1)
Sodium: 131 mmol/L — ABNORMAL LOW (ref 135–145)

## 2016-01-21 LAB — CBC
HCT: 24 % — ABNORMAL LOW (ref 36.0–46.0)
Hemoglobin: 7.8 g/dL — ABNORMAL LOW (ref 12.0–15.0)
MCH: 26.3 pg (ref 26.0–34.0)
MCHC: 32.5 g/dL (ref 30.0–36.0)
MCV: 80.8 fL (ref 78.0–100.0)
Platelets: 264 10*3/uL (ref 150–400)
RBC: 2.97 MIL/uL — ABNORMAL LOW (ref 3.87–5.11)
RDW: 18.4 % — ABNORMAL HIGH (ref 11.5–15.5)
WBC: 8.6 10*3/uL (ref 4.0–10.5)

## 2016-01-21 LAB — PROTIME-INR
INR: 1.42
PROTHROMBIN TIME: 17.5 s — AB (ref 11.4–15.2)

## 2016-01-21 LAB — TSH

## 2016-01-21 MED ORDER — SEVELAMER CARBONATE 800 MG PO TABS
ORAL_TABLET | ORAL | Status: AC
  Administered 2016-01-21: 3200 mg via ORAL
  Filled 2016-01-21: qty 4

## 2016-01-21 MED ORDER — EPOETIN ALFA 10000 UNIT/ML IJ SOLN
INTRAMUSCULAR | Status: AC
  Administered 2016-01-21: 8000 [IU] via INTRAVENOUS
  Filled 2016-01-21: qty 1

## 2016-01-21 MED ORDER — FUROSEMIDE 40 MG PO TABS: 120.0000 mg | ORAL_TABLET | Freq: Every day | ORAL | Status: DC

## 2016-01-21 MED ORDER — LIDOCAINE HCL (PF) 1 % IJ SOLN
INTRAMUSCULAR | Status: AC
  Administered 2016-01-21: 0.5 mL via INTRADERMAL
  Filled 2016-01-21: qty 5

## 2016-01-21 MED ORDER — SODIUM CHLORIDE 0.9% FLUSH: 3.0000 mL | Freq: Two times a day (BID) | INTRAVENOUS | Status: DC

## 2016-01-21 MED ORDER — PREDNISONE 20 MG PO TABS: 20.0000 mg | ORAL_TABLET | Freq: Every day | ORAL | Status: DC

## 2016-01-21 MED ORDER — PENTAFLUOROPROP-TETRAFLUOROETH EX AERO: 1.0000 "application " | INHALATION_SPRAY | CUTANEOUS | Status: DC | PRN

## 2016-01-21 MED ORDER — EPOETIN ALFA 10000 UNIT/ML IJ SOLN
8000.0000 [IU] | Freq: Once | INTRAMUSCULAR | Status: AC
  Administered 2016-01-21: 8000 [IU] via INTRAVENOUS

## 2016-01-21 MED ORDER — RISPERIDONE MICROSPHERES 25 MG IM SUSR: 25.0000 mg | INTRAMUSCULAR | Status: DC

## 2016-01-21 MED ORDER — TRAMADOL HCL 50 MG PO TABS
50.0000 mg | ORAL_TABLET | Freq: Four times a day (QID) | ORAL | Status: DC | PRN
  Administered 2016-01-21: 50 mg via ORAL

## 2016-01-21 MED ORDER — HEPARIN SODIUM (PORCINE) 1000 UNIT/ML IJ SOLN
INTRAMUSCULAR | Status: AC
  Administered 2016-01-21: 1400 [IU] via INTRAVENOUS_CENTRAL
  Filled 2016-01-21: qty 6

## 2016-01-21 MED ORDER — ONDANSETRON HCL 4 MG/2ML IJ SOLN: 4.0000 mg | Freq: Four times a day (QID) | INTRAMUSCULAR | Status: DC | PRN

## 2016-01-21 MED ORDER — LIDOCAINE-PRILOCAINE 2.5-2.5 % EX CREA: 1.0000 "application " | TOPICAL_CREAM | CUTANEOUS | Status: DC | PRN

## 2016-01-21 MED ORDER — METOPROLOL TARTRATE 50 MG PO TABS: 50.0000 mg | ORAL_TABLET | Freq: Two times a day (BID) | ORAL | Status: DC

## 2016-01-21 MED ORDER — SODIUM CHLORIDE 0.9 % IV SOLN: INTRAVENOUS | Status: DC

## 2016-01-21 MED ORDER — SODIUM CHLORIDE 0.9 % IV SOLN: 100.0000 mL | INTRAVENOUS | Status: DC | PRN

## 2016-01-21 MED ORDER — ACETAMINOPHEN 500 MG PO TABS: 1000.0000 mg | ORAL_TABLET | Freq: Four times a day (QID) | ORAL | Status: DC | PRN

## 2016-01-21 MED ORDER — FOLIC ACID 1 MG PO TABS: 1.0000 mg | ORAL_TABLET | Freq: Every day | ORAL | Status: DC

## 2016-01-21 MED ORDER — HEPARIN SODIUM (PORCINE) 1000 UNIT/ML DIALYSIS
20.0000 [IU]/kg | INTRAMUSCULAR | Status: DC | PRN
  Administered 2016-01-21: 1400 [IU] via INTRAVENOUS_CENTRAL

## 2016-01-21 MED ORDER — HEPARIN SODIUM (PORCINE) 5000 UNIT/ML IJ SOLN: 5000.0000 [IU] | Freq: Three times a day (TID) | INTRAMUSCULAR | Status: DC

## 2016-01-21 MED ORDER — LIDOCAINE HCL (PF) 1 % IJ SOLN
5.0000 mL | INTRAMUSCULAR | Status: DC | PRN
  Administered 2016-01-21: 0.5 mL via INTRADERMAL

## 2016-01-21 MED ORDER — TRAMADOL HCL 50 MG PO TABS
ORAL_TABLET | ORAL | Status: AC
  Administered 2016-01-21: 50 mg via ORAL
  Filled 2016-01-21: qty 1

## 2016-01-21 MED ORDER — ONDANSETRON HCL 4 MG PO TABS: 4.0000 mg | ORAL_TABLET | Freq: Four times a day (QID) | ORAL | Status: DC | PRN

## 2016-01-21 MED ORDER — SEVELAMER CARBONATE 800 MG PO TABS
3200.0000 mg | ORAL_TABLET | Freq: Three times a day (TID) | ORAL | Status: DC
  Administered 2016-01-21: 3200 mg via ORAL

## 2016-01-21 MED ORDER — ASPIRIN-ACETAMINOPHEN-CAFFEINE 250-250-65 MG PO TABS: 2.0000 | ORAL_TABLET | Freq: Four times a day (QID) | ORAL | Status: DC | PRN

## 2016-01-21 MED ORDER — HYDROCODONE-ACETAMINOPHEN 5-325 MG PO TABS: 1.0000 | ORAL_TABLET | ORAL | Status: DC | PRN

## 2016-01-21 MED ORDER — DIPHENHYDRAMINE HCL 25 MG PO CAPS
ORAL_CAPSULE | ORAL | Status: AC
  Administered 2016-01-21: 25 mg
  Filled 2016-01-21: qty 1

## 2016-01-21 NOTE — ED Triage Notes (Signed)
Pt presents for dialysis.  States she is also out of blood pressure medications and prednisone.

## 2016-01-21 NOTE — ED Notes (Signed)
Pt ambulated to [facility]

## 2016-01-21 NOTE — Consult Note (Signed)
Sara [name] MRN: [id number] DOB/AGE: 1980-08-28 35 y.o. Primary Care Physician:Pcp Not In System Admit date: 01/21/2016 Chief Complaint:  Chief Complaint  Patient presents with  . Vascular Access Problem   HPI: Pt is 35 year old female with past medical hx of ESRD who was admitted with c/o dyspnea," I need dialysis "  HPI dates 2days ago as pt came to ER asking for her dialysis. Pt says " I need my dialysis .". No c/o fever/cough/chills NO c/o nausea/vomiting. NO c/o abdominal pain. No c/o hematuria . NO c/o syncope.     Past Medical History:  Diagnosis Date  . Acute myopericarditis    hx/notes 10/09/2014  . Bipolar disorder ([place])    [name] 10/09/2014  . CHF (congestive heart failure) ([place])    systolic/notes [id number]  . Chronic anemia    [name] 10/09/2014  . Current use of steroid medication 12/21/2015  . ESRD (end stage renal disease) on dialysis [facility])    "[name]; [facility]" (10/09/2014)  . History of blood transfusion    "this is probably my 3rd" (10/09/2014)  . Hypertension   . Low back pain   . Lupus    lupus w nephritis  . 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 Non-compliant patient   . Positive ANA (antinuclear antibody) 08/16/2012  . Positive Smith antibody 08/16/2012  . Pregnancy   . Psychosis   . Schizoaffective disorder, bipolar type ([place]) 11/20/2014  . Schizophrenia ([place])    [name] 10/09/2014  . Tobacco abuse 02/20/2014        Family History  Problem Relation Age of Onset  . Drug abuse Father   . Kidney disease Father     Social History:  reports that she has been smoking Cigarettes.  She has a 2.00 pack-year smoking history. She has never used smokeless tobacco. She reports that she does not drink alcohol or use drugs.   Allergies:  Allergies  Allergen Reactions  . Ativan [Lorazepam] Swelling and Other (See Comments)    Dysarthria(patient has difficulty speaking and slurred speech); denies swelling, itching, pain, or numbness.  [name] [Ziprasidone Hcl]  Itching and Swelling    Tongue swelling  . Keflex [Cephalexin] Swelling and Other (See Comments)    Tongue swelling. Can't talk   . Haldol [Haloperidol Lactate] Swelling    Tongue swelling. 05/31/15 - MD ok with giving as pt has tolerated in the past Pt can take benadryl.  . Other Itching    wool     (Not in a hospital admission)     [id number] from the symptoms mentioned above,there are no other symptoms referable to all systems reviewed.      Physical Exam: Vital signs in last 24 hours: Temp:  [97.7 F (36.5 C)] 97.7 F (36.5 C) (10/16 0724) Pulse Rate:  [79-106] 106 (10/16 0836) Resp:  [18] 18 (10/16 0836) BP: (158-161)/(115-121) 158/121 (10/16 0836) SpO2:  [100 %] 100 % (10/16 0836) Weight:  [150 lb (68 kg)] 150 lb (68 kg) (10/16 0724) Weight change:     Intake/Output from previous day: No intake/output data recorded. No intake/output data recorded.   Physical Exam: General- pt is awake,alert, follows comands Resp- No acute REsp distress, chest clear to asuculatation CVS- S1S2 regular in rate and rhythm, NO rubs  GIT- BS+, soft, NT, ND EXT- No LE Edema, NO Cyanosis CNS- CN 2-12 grossly intact. Moving all 4 extremities Access- AVF+    Lab Results:  CBC    Component Value Date/Time   WBC 6.3  01/18/2016 1602   RBC 3.19 (L) 01/18/2016 1602   HGB 8.3 (L) 01/18/2016 1602   HCT 25.9 (L) 01/18/2016 1602   PLT 277 01/18/2016 1602   MCV 81.2 01/18/2016 1602   MCH 26.0 01/18/2016 1602   MCHC 32.0 01/18/2016 1602   RDW 18.4 (H) 01/18/2016 1602   LYMPHSABS 2.2 12/21/2015 0928   MONOABS 0.7 12/21/2015 0928   EOSABS 0.3 12/21/2015 0928   BASOSABS 0.0 12/21/2015 0928      BMET BMP Latest Ref Rng & Units 01/18/2016 01/11/2016 01/07/2016  Glucose 65 - 99 mg/dL 126(H) 86 79  BUN 6 - 20 mg/dL 82(H) 72(H) 68(H)  Creatinine 0.44 - 1.00 mg/dL 12.68(H) 12.66(H) 10.89(H)  Sodium 135 - 145 mmol/L 134(L) 132(L) 132(L)  Potassium 3.5 - 5.1 mmol/L 4.9 5.4(H) 4.5   Chloride 101 - 111 mmol/L 97(L) 96(L) 97(L)  CO2 22 - 32 mmol/L 23 24 23   Calcium 8.9 - 10.3 mg/dL 9.4 9.5 9.5         Lab Results  Component Value Date   PTH 1,100 (H) 11/19/2015   PTH Comment 11/19/2015   CALCIUM 9.4 01/18/2016   CAION 1.18 12/17/2015   PHOS 8.4 (H) 01/18/2016      Impression: 1)Renal  ESRD on HD                Pt is not on regular  Schedule sec to her complaince/adherence issues                Will dialyze pt today  2)HTN Bp is not at goal  3)Anemia In ESRD the goal for HGb is 9--11. 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   Not at goal sec to non adherence to hd tx   Will keep on zemplar/Hectrol  Phosphorus not at goal.  on binders  5)Psych .  Hx of   psycosis Schizophrenia Primary MD following  6)Electrolytes  Hx of Hyperkalemia   Hx of Hyponatremia   7)Acid base Co2  at goal    Plan:  Will dialyze today Will use 2 k bath Will keep on epo-4000 units  Will try to take 2.5 liters off if possible   Tristain Daily S 01/21/2016, 9:35 AM
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
## 2016-01-21 NOTE — Discharge Summary (Signed)
Physician Discharge Summary  Sara [name] [name] DOB: 01-11-1981 DOA: 01/21/2016  PCP: Pcp Not In System  Admit date: 01/21/2016 Discharge date: 01/21/2016  Admitted From: Home Disposition: Home  Recommendations for Outpatient Follow-up:  1. Follow up with PCP in 1-2 weeks 2. Please obtain BMP/CBC in one week  Home Health:NA Equipment/Devices: NA  Discharge Condition: Stable CODE STATUS: Full Diet recommendation: Heart Healthy  Brief/Interim Summary: Sara [name] is a 35 y.o. female with medical history significant of lupus, has ESRD on hemodialysis. Patient lives in [place] but she cannot be dialyzed over there. She came in to the ED for hemodialysis.  Discharge Diagnoses:  Active Problems:   ESRD needing dialysis ([place])   ESRD on hemodialysis -Patient is here for elective dialysis, cannot be dialyzed as outpatient. -Nephrology consulted, Dr. Theador Hawthorne, Dialysis was done. -Patient discharged home in good condition, to come back for next dialysis.  History of lupus -Her prednisone continued instructed to continue that.  Tobacco abuse -Patient smokes one pack per day, they have done extensive counseling personally.  Chronic systolic CHF -LVEF is [id number], compensated, continue home medications.  Schizophrenia -On Risperdal Consta, follow with psych as outpatient.   Discharge Instructions  Discharge Instructions    Diet - low sodium heart healthy    Complete by:  As directed    Increase activity slowly    Complete by:  As directed        Medication List    TAKE these medications   acetaminophen 500 MG tablet Commonly known as:  TYLENOL Take 1,000 mg by mouth every 6 (six) hours as needed for moderate pain. Reported on 06/12/2015   aspirin-acetaminophen-caffeine 250-250-65 MG tablet Commonly known as:  EXCEDRIN MIGRAINE Take 2 tablets by mouth every 6 (six) hours as needed for headache.   folic acid 1 MG tablet Commonly known as:   FOLVITE Take 1 tablet (1 mg total) by mouth daily.   furosemide 40 MG tablet Commonly known as:  LASIX Take 3 tablets (120 mg total) by mouth daily.   metoprolol 50 MG tablet Commonly known as:  LOPRESSOR Take 1 tablet (50 mg total) by mouth 2 (two) times daily.   predniSONE 20 MG tablet Commonly known as:  DELTASONE Take 1 tablet (20 mg total) by mouth daily with breakfast.   risperiDONE microspheres 25 MG injection Commonly known as:  RISPERDAL CONSTA Inject 25 mg into the muscle every 14 (fourteen) days.   sevelamer carbonate 800 MG tablet Commonly known as:  RENVELA Take 4 tablets (3,200 mg total) by mouth 3 (three) times daily with meals.   traMADol 50 MG tablet Commonly known as:  ULTRAM Take 1 tablet (50 mg total) by mouth every 6 (six) hours as needed for moderate pain.       Allergies  Allergen Reactions  . Ativan [Lorazepam] Swelling and Other (See Comments)    Dysarthria(patient has difficulty speaking and slurred speech); denies swelling, itching, pain, or numbness.  [name] [Ziprasidone Hcl] Itching and Swelling    Tongue swelling  . Keflex [Cephalexin] Swelling and Other (See Comments)    Tongue swelling. Can't talk   . Haldol [Haloperidol Lactate] Swelling    Tongue swelling. 05/31/15 - MD ok with giving as pt has tolerated in the past Pt can take benadryl.  . Other Itching    wool    Consultations: Nephrology  Procedures/Studies: No results found. (Echo, Carotid, EGD, Colonoscopy, ERCP)    Subjective:   Discharge Exam: Vitals:   01/21/16  0724 01/21/16 0836  BP: (!) 161/115 (!) 158/121  Pulse: 79 106  Resp: 18 18  Temp: 97.7 F (36.5 C)    Vitals:   01/21/16 0724 01/21/16 0836  BP: (!) 161/115 (!) 158/121  Pulse: 79 106  Resp: 18 18  Temp: 97.7 F (36.5 C)   TempSrc: Oral   SpO2: 100% 100%  Weight: 68 kg (150 lb)   Height: 5\' 7"  (1.702 m)     General: Pt is alert, awake, not in acute distress Cardiovascular: RRR, S1/S2 +,  no rubs, no gallops Respiratory: CTA bilaterally, no wheezing, no rhonchi Abdominal: Soft, NT, ND, bowel sounds + Extremities: no edema, no cyanosis    The results of significant diagnostics from this hospitalization (including imaging, microbiology, ancillary and laboratory) are listed below for reference.     Microbiology: No results found for this or any previous visit (from the past 240 hour(s)).   Labs: BNP (last 3 results) No results for input(s): BNP in the last 8760 hours. Basic Metabolic Panel:  Recent Labs Lab 01/18/16 1602  NA 134*  K 4.9  CL 97*  CO2 23  GLUCOSE 126*  BUN 82*  CREATININE 12.68*  CALCIUM 9.4  PHOS 8.4*   Liver Function Tests:  Recent Labs Lab 01/18/16 1602  ALBUMIN 3.3*   No results for input(s): LIPASE, AMYLASE in the last 168 hours. No results for input(s): AMMONIA in the last 168 hours. CBC:  Recent Labs Lab 01/18/16 1602  WBC 6.3  HGB 8.3*  HCT 25.9*  MCV 81.2  PLT 277   Cardiac Enzymes: No results for input(s): CKTOTAL, CKMB, CKMBINDEX, TROPONINI in the last 168 hours. BNP: Invalid input(s): POCBNP CBG: No results for input(s): GLUCAP in the last 168 hours. D-Dimer No results for input(s): DDIMER in the last 72 hours. Hgb A1c  Recent Labs  01/18/16 1602  HGBA1C 5.3   Lipid Profile No results for input(s): CHOL, HDL, LDLCALC, TRIG, CHOLHDL, LDLDIRECT in the last 72 hours. Thyroid function studies No results for input(s): TSH, T4TOTAL, T3FREE, THYROIDAB in the last 72 hours.  Invalid input(s): FREET3 Anemia work up No results for input(s): VITAMINB12, FOLATE, FERRITIN, TIBC, IRON, RETICCTPCT in the last 72 hours. Urinalysis    Component Value Date/Time   COLORURINE YELLOW 08/11/2015 1235   APPEARANCEUR HAZY (A) 08/11/2015 1235   LABSPEC 1.015 08/11/2015 1235   PHURINE 7.5 08/11/2015 1235   GLUCOSEU 100 (A) 08/11/2015 1235   HGBUR SMALL (A) 08/11/2015 1235   BILIRUBINUR NEGATIVE 08/11/2015 1235    KETONESUR NEGATIVE 08/11/2015 1235   PROTEINUR >300 (A) 08/11/2015 1235   UROBILINOGEN 0.2 01/09/2015 1645   NITRITE NEGATIVE 08/11/2015 1235   LEUKOCYTESUR SMALL (A) 08/11/2015 1235   Sepsis Labs Invalid input(s): PROCALCITONIN,  WBC,  LACTICIDVEN Microbiology No results found for this or any previous visit (from the past 240 hour(s)).   Time coordinating discharge: Over 30 minutes  SIGNED:   Birdie Hopes, MD  [facility] 01/21/2016, 10:30 AM Pager   If 7PM-7AM, please contact night-coverage [email or web address] Password [name]

## 2016-01-21 NOTE — Procedures (Signed)
    HEMODIALYSIS TREATMENT NOTE:  2.25 hour low-heparin dialysis completed via right upper arm AVF (15g ante/retrograde). Goal NOT met:  Pt signed off of HD early/AMA after 2 hours and 15 minutes.  Pt left hospital AMA from HD unit.  Sodium, fluid, and dietary restrictions were encouraged.    Rockwell Alexandria, RN, CDN
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
## 2016-01-22 LAB — HEMOGLOBIN A1C
Hgb A1c MFr Bld: 5.3 % (ref 4.8–5.6)
Mean Plasma Glucose: 105 mg/dL

## 2016-01-23 ENCOUNTER — Observation Stay (HOSPITAL_COMMUNITY)
Admission: EM | Admit: 2016-01-23 | Discharge: 2016-01-23 | Disposition: A | Discharge status: Home / Self Care | Payer: Medicare Other | Attending: Internal Medicine | Admitting: Internal Medicine

## 2016-01-23 ENCOUNTER — Encounter (HOSPITAL_COMMUNITY): Payer: Self-pay

## 2016-01-23 DIAGNOSIS — Z992 Dependence on renal dialysis: Secondary | ICD-10-CM | POA: Diagnosis not present

## 2016-01-23 DIAGNOSIS — Z7982 Long term (current) use of aspirin: Secondary | ICD-10-CM | POA: Diagnosis not present

## 2016-01-23 DIAGNOSIS — D631 Anemia in chronic kidney disease: Secondary | ICD-10-CM | POA: Diagnosis not present

## 2016-01-23 DIAGNOSIS — I5022 Chronic systolic (congestive) heart failure: Secondary | ICD-10-CM | POA: Diagnosis not present

## 2016-01-23 DIAGNOSIS — Z79899 Other long term (current) drug therapy: Secondary | ICD-10-CM | POA: Insufficient documentation

## 2016-01-23 DIAGNOSIS — I132 Hypertensive heart and chronic kidney disease with heart failure and with stage 5 chronic kidney disease, or end stage renal disease: Secondary | ICD-10-CM | POA: Diagnosis not present

## 2016-01-23 DIAGNOSIS — Z452 Encounter for adjustment and management of vascular access device: Secondary | ICD-10-CM | POA: Diagnosis present

## 2016-01-23 DIAGNOSIS — N186 End stage renal disease: Secondary | ICD-10-CM | POA: Insufficient documentation

## 2016-01-23 DIAGNOSIS — I10 Essential (primary) hypertension: Secondary | ICD-10-CM

## 2016-01-23 DIAGNOSIS — F17210 Nicotine dependence, cigarettes, uncomplicated: Secondary | ICD-10-CM | POA: Insufficient documentation

## 2016-01-23 DIAGNOSIS — E8770 Fluid overload, unspecified: Secondary | ICD-10-CM | POA: Diagnosis not present

## 2016-01-23 DIAGNOSIS — I120 Hypertensive chronic kidney disease with stage 5 chronic kidney disease or end stage renal disease: Secondary | ICD-10-CM | POA: Diagnosis not present

## 2016-01-23 LAB — RENAL FUNCTION PANEL
Albumin: 3.2 g/dL — ABNORMAL LOW (ref 3.5–5.0)
Anion gap: 14 (ref 5–15)
BUN: 85 mg/dL — ABNORMAL HIGH (ref 6–20)
CO2: 23 mmol/L (ref 22–32)
Calcium: 9.3 mg/dL (ref 8.9–10.3)
Chloride: 95 mmol/L — ABNORMAL LOW (ref 101–111)
Creatinine, Ser: 14.87 mg/dL — ABNORMAL HIGH (ref 0.44–1.00)
GFR calc Af Amer: 3 mL/min — ABNORMAL LOW (ref >60)
GFR calc non Af Amer: 3 mL/min — ABNORMAL LOW (ref >60)
Glucose, Bld: 111 mg/dL — ABNORMAL HIGH (ref 65–99)
Phosphorus: 9.2 mg/dL — ABNORMAL HIGH (ref 2.5–4.6)
Potassium: 5.4 mmol/L — ABNORMAL HIGH (ref 3.5–5.1)
Sodium: 132 mmol/L — ABNORMAL LOW (ref 135–145)

## 2016-01-23 LAB — CBC
HCT: 26.1 % — ABNORMAL LOW (ref 36.0–46.0)
Hemoglobin: 8.4 g/dL — ABNORMAL LOW (ref 12.0–15.0)
MCH: 26 pg (ref 26.0–34.0)
MCHC: 32.2 g/dL (ref 30.0–36.0)
MCV: 80.8 fL (ref 78.0–100.0)
Platelets: 327 10*3/uL (ref 150–400)
RBC: 3.23 MIL/uL — ABNORMAL LOW (ref 3.87–5.11)
RDW: 18.4 % — ABNORMAL HIGH (ref 11.5–15.5)
WBC: 7.9 10*3/uL (ref 4.0–10.5)

## 2016-01-23 LAB — TSH

## 2016-01-23 LAB — T4, FREE: Free T4: 1.9 ng/dL — ABNORMAL HIGH (ref 0.61–1.12)

## 2016-01-23 MED ORDER — ACETAMINOPHEN 500 MG PO TABS: 1000.0000 mg | ORAL_TABLET | Freq: Four times a day (QID) | ORAL | Status: DC | PRN

## 2016-01-23 MED ORDER — DOXERCALCIFEROL 4 MCG/2ML IV SOLN
INTRAVENOUS | Status: AC
  Administered 2016-01-23: 5 ug via INTRAVENOUS
  Filled 2016-01-23: qty 4

## 2016-01-23 MED ORDER — TRAMADOL HCL 50 MG PO TABS: 50.0000 mg | ORAL_TABLET | Freq: Four times a day (QID) | ORAL | Status: DC | PRN

## 2016-01-23 MED ORDER — FUROSEMIDE 40 MG PO TABS: 120.0000 mg | ORAL_TABLET | Freq: Every day | ORAL | 0 refills | Status: DC

## 2016-01-23 MED ORDER — LIDOCAINE-PRILOCAINE 2.5-2.5 % EX CREA
1.0000 | TOPICAL_CREAM | CUTANEOUS | Status: DC | PRN
Start: 2016-01-23 — End: 2016-01-23

## 2016-01-23 MED ORDER — SODIUM CHLORIDE 0.9 % IV SOLN: 100.0000 mL | INTRAVENOUS | Status: DC | PRN

## 2016-01-23 MED ORDER — SEVELAMER CARBONATE 800 MG PO TABS
ORAL_TABLET | ORAL | Status: AC
  Administered 2016-01-23: 3200 mg via ORAL
  Filled 2016-01-23: qty 4

## 2016-01-23 MED ORDER — PENTAFLUOROPROP-TETRAFLUOROETH EX AERO: 1.0000 "application " | INHALATION_SPRAY | CUTANEOUS | Status: DC | PRN

## 2016-01-23 MED ORDER — EPOETIN ALFA 4000 UNIT/ML IJ SOLN: 4000.0000 [IU] | Freq: Once | INTRAMUSCULAR | Status: DC

## 2016-01-23 MED ORDER — TRAMADOL HCL 50 MG PO TABS
50.0000 mg | ORAL_TABLET | Freq: Two times a day (BID) | ORAL | Status: DC | PRN
  Administered 2016-01-23: 50 mg via ORAL

## 2016-01-23 MED ORDER — FUROSEMIDE 80 MG PO TABS
120.0000 mg | ORAL_TABLET | Freq: Every day | ORAL | Status: DC
  Administered 2016-01-23: 120 mg via ORAL
  Filled 2016-01-23: qty 1

## 2016-01-23 MED ORDER — SEVELAMER CARBONATE 800 MG PO TABS: 3200.0000 mg | ORAL_TABLET | Freq: Three times a day (TID) | ORAL | Status: DC

## 2016-01-23 MED ORDER — DIPHENHYDRAMINE HCL 25 MG PO CAPS
ORAL_CAPSULE | ORAL | Status: AC
  Administered 2016-01-23: 25 mg via ORAL
  Filled 2016-01-23: qty 1

## 2016-01-23 MED ORDER — EPOETIN ALFA 4000 UNIT/ML IJ SOLN
INTRAMUSCULAR | Status: AC
  Administered 2016-01-23: 4000 [IU] via INTRAVENOUS
  Filled 2016-01-23: qty 1

## 2016-01-23 MED ORDER — ASPIRIN-ACETAMINOPHEN-CAFFEINE 250-250-65 MG PO TABS
2.0000 | ORAL_TABLET | Freq: Four times a day (QID) | ORAL | Status: DC | PRN
Start: 2016-01-23 — End: 2016-01-23
  Filled 2016-01-23: qty 2

## 2016-01-23 MED ORDER — LIDOCAINE HCL (PF) 1 % IJ SOLN
INTRAMUSCULAR | Status: AC
  Administered 2016-01-23: 0.5 mL via INTRADERMAL
  Filled 2016-01-23: qty 5

## 2016-01-23 MED ORDER — FOLIC ACID 1 MG PO TABS
1.0000 mg | ORAL_TABLET | Freq: Every day | ORAL | Status: DC
  Administered 2016-01-23: 1 mg via ORAL
  Filled 2016-01-23: qty 1

## 2016-01-23 MED ORDER — HEPARIN SODIUM (PORCINE) 1000 UNIT/ML IJ SOLN
INTRAMUSCULAR | Status: AC
  Administered 2016-01-23: 1400 [IU] via INTRAVENOUS_CENTRAL
  Filled 2016-01-23: qty 6

## 2016-01-23 MED ORDER — LIDOCAINE HCL (PF) 1 % IJ SOLN
5.0000 mL | INTRAMUSCULAR | Status: DC | PRN
  Administered 2016-01-23: 0.5 mL via INTRADERMAL

## 2016-01-23 MED ORDER — DIPHENHYDRAMINE HCL 25 MG PO CAPS
25.0000 mg | ORAL_CAPSULE | Freq: Four times a day (QID) | ORAL | Status: DC | PRN
  Administered 2016-01-23: 25 mg via ORAL

## 2016-01-23 MED ORDER — TRAMADOL HCL 50 MG PO TABS
ORAL_TABLET | ORAL | Status: AC
  Administered 2016-01-23: 50 mg via ORAL
  Filled 2016-01-23: qty 1

## 2016-01-23 MED ORDER — HEPARIN SODIUM (PORCINE) 1000 UNIT/ML DIALYSIS
20.0000 [IU]/kg | INTRAMUSCULAR | Status: DC | PRN
  Administered 2016-01-23: 1400 [IU] via INTRAVENOUS_CENTRAL

## 2016-01-23 MED ORDER — DOXERCALCIFEROL 4 MCG/2ML IV SOLN
5.0000 ug | Freq: Once | INTRAVENOUS | Status: AC
  Administered 2016-01-23: 5 ug via INTRAVENOUS

## 2016-01-23 MED ORDER — METOPROLOL TARTRATE 50 MG PO TABS
50.0000 mg | ORAL_TABLET | Freq: Two times a day (BID) | ORAL | Status: DC
  Administered 2016-01-23: 50 mg via ORAL
  Filled 2016-01-23: qty 1

## 2016-01-23 MED ORDER — SEVELAMER CARBONATE 800 MG PO TABS
3200.0000 mg | ORAL_TABLET | Freq: Three times a day (TID) | ORAL | Status: DC
  Administered 2016-01-23 (×2): 3200 mg via ORAL
  Filled 2016-01-23: qty 4

## 2016-01-23 MED ORDER — EPOETIN ALFA 4000 UNIT/ML IJ SOLN
4000.0000 [IU] | Freq: Once | INTRAMUSCULAR | Status: AC
  Administered 2016-01-23: 4000 [IU] via INTRAVENOUS

## 2016-01-23 MED ORDER — PREDNISONE 20 MG PO TABS: 20.0000 mg | ORAL_TABLET | Freq: Every day | ORAL | Status: DC

## 2016-01-23 NOTE — Procedures (Signed)
   HEMODIALYSIS TREATMENT NOTE:  4 hour low-heparin dialysis completed via right upper arm AVF (15g ante/retrograde). Goal met: 2.5 liters removed without interruption in ultrafiltration.  All blood was returned and hemostasis was achieved within 12 minutes.  Report called to Vista Deck, RN.  Rockwell Alexandria, RN, CDN
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
## 2016-01-23 NOTE — Consult Note (Signed)
Sara [name] MRN: [id number] DOB/AGE: 1980-06-12 35 y.o. Primary Care Physician:Pcp Not In System Admit date: 01/23/2016 Chief Complaint:  Chief Complaint  Patient presents with  . Vascular Access Problem   HPI: Pt is 35 year old female with past medical hx of ESRD who was admitted with c/o dyspnea," I need dialysis "  HPI dates 1- 2 days ago as pt came to ER asking for her dialysis. Pt says " I need my dialysis .". No c/o fever/cough/chills NO c/o nausea/vomiting. NO c/o abdominal pain. No c/o hematuria . NO c/o syncope.     Past Medical History:  Diagnosis Date  . Acute myopericarditis    hx/notes 10/09/2014  . Bipolar disorder ([place])    [name] 10/09/2014  . CHF (congestive heart failure) ([place])    systolic/notes [id number]  . Chronic anemia    [name] 10/09/2014  . Current use of steroid medication 12/21/2015  . ESRD (end stage renal disease) on dialysis [facility])    "[name]; [facility]" (10/09/2014)  . History of blood transfusion    "this is probably my 3rd" (10/09/2014)  . Hypertension   . Low back pain   . Lupus    lupus w nephritis  . 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 Non-compliant patient   . Positive ANA (antinuclear antibody) 08/16/2012  . Positive Smith antibody 08/16/2012  . Pregnancy   . Psychosis   . Schizoaffective disorder, bipolar type ([place]) 11/20/2014  . Schizophrenia ([place])    [name] 10/09/2014  . Tobacco abuse 02/20/2014        Family History  Problem Relation Age of Onset  . Drug abuse Father   . Kidney disease Father     Social History:  reports that she has been smoking Cigarettes.  She has a 2.00 pack-year smoking history. She has never used smokeless tobacco. She reports that she does not drink alcohol or use drugs.   Allergies:  Allergies  Allergen Reactions  . Ativan [Lorazepam] Swelling and Other (See Comments)    Dysarthria(patient has difficulty speaking and slurred speech); denies swelling, itching, pain, or numbness.  [name] [Ziprasidone Hcl]  Itching and Swelling    Tongue swelling  . Keflex [Cephalexin] Swelling and Other (See Comments)    Tongue swelling. Can't talk   . Haldol [Haloperidol Lactate] Swelling    Tongue swelling. 05/31/15 - MD ok with giving as pt has tolerated in the past Pt can take benadryl.  . Other Itching    wool     (Not in a hospital admission)     [id number] from the symptoms mentioned above,there are no other symptoms referable to all systems reviewed.      Physical Exam: Vital signs in last 24 hours: Temp:  [97.5 F (36.4 C)] 97.5 F (36.4 C) (10/18 0734) Pulse Rate:  [102] 102 (10/18 0734) Resp:  [20] 20 (10/18 0734) BP: (177)/(125) 177/125 (10/18 0734) SpO2:  [100 %] 100 % (10/18 0745) Weight:  [150 lb (68 kg)] 150 lb (68 kg) (10/18 0734) Weight change:     Intake/Output from previous day: No intake/output data recorded. No intake/output data recorded.   Physical Exam: General- pt is awake,alert, follows comands Resp- No acute REsp distress, chest clear to asuculatation CVS- S1S2 regular in rate and rhythm, NO rubs  GIT- BS+, soft, NT, ND EXT- No LE Edema, NO Cyanosis CNS- CN 2-12 grossly intact. Moving all 4 extremities Access- AVF+    Lab Results:  CBC    Component Value Date/Time  WBC 8.6 01/21/2016 1203   RBC 2.97 (L) 01/21/2016 1203   HGB 7.8 (L) 01/21/2016 1203   HCT 24.0 (L) 01/21/2016 1203   PLT 264 01/21/2016 1203   MCV 80.8 01/21/2016 1203   MCH 26.3 01/21/2016 1203   MCHC 32.5 01/21/2016 1203   RDW 18.4 (H) 01/21/2016 1203   LYMPHSABS 2.2 12/21/2015 0928   MONOABS 0.7 12/21/2015 0928   EOSABS 0.3 12/21/2015 0928   BASOSABS 0.0 12/21/2015 0928      BMET BMP Latest Ref Rng & Units 01/21/2016 01/18/2016 01/11/2016  Glucose 65 - 99 mg/dL 101(H) 126(H) 86  BUN 6 - 20 mg/dL 104(H) 82(H) 72(H)  Creatinine 0.44 - 1.00 mg/dL 16.82(H) 12.68(H) 12.66(H)  Sodium 135 - 145 mmol/L 131(L) 134(L) 132(L)  Potassium 3.5 - 5.1 mmol/L 5.2(H) 4.9 5.4(H)   Chloride 101 - 111 mmol/L 94(L) 97(L) 96(L)  CO2 22 - 32 mmol/L 20(L) 23 24  Calcium 8.9 - 10.3 mg/dL 9.3 9.4 9.5         Lab Results  Component Value Date   PTH 1,100 (H) 11/19/2015   PTH Comment 11/19/2015   CALCIUM 9.3 01/21/2016   CAION 1.18 12/17/2015   PHOS 10.0 (H) 01/21/2016      Impression: 1)Renal  ESRD on HD                Pt is not on regular  Schedule sec to her complaince/adherence issues                Will dialyze pt today  2)HTN Bp is not at goal  3)Anemia In ESRD the goal for HGb is 9--11. 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   Not at goal sec to non adherence to hd tx   Will keep on zemplar/Hectrol  Phosphorus not at goal.  on binders  5)Psych .  Hx of   psycosis Schizophrenia Primary MD following  6)Electrolytes  Hx of Hyperkalemia   Hx of Hyponatremia   7)Acid base Co2  at goal    Plan:  Will dialyze today Will use 2 k bath Will keep on epo-4000 units  Will try to take 2.5 liters off if possible   Addendum  Pt seen on Dialysis. Pt tolerating tx well.   BHUTANI,MANPREET S 01/23/2016, 9:12 AM

## 2016-01-23 NOTE — Discharge Summary (Signed)
Physician Discharge Summary  Sara [name] [name] DOB: 1980-10-16 DOA: 01/23/2016  PCP: Pcp Not In System  Admit date: 01/23/2016 Discharge date: 01/23/2016  Time spent: 15 minutes  Recommendations for Outpatient Follow-up:  1. Follow up with PCP in 1 week 2. Patient will come back to [facility] for dialysis needs 3. Follow up TSH and T3, T4   Discharge Diagnoses:  Active Problems:   ESRD on hemodialysis ([place])   ESRD (end stage renal disease) ([place])   Anemia in chronic kidney disease, on chronic dialysis ([place])   Hypertension  Discharge Condition: stable  Diet recommendation: low salt  Filed Weights   01/23/16 0734 01/23/16 0947  Weight: 68 kg (150 lb) 66.5 kg (146 lb 9.7 oz)    History of present illness and hospital course:  Patient was admitted to the hospital for regular dialysis. She did not have any complaints of shortness of breath or chest pain. She underwent without any acute events. Once dialysis was complete, patient requested discharge home. Of note, she was noted to have low TSH on last admission. This was repeated for confirmation. This is pending at the time of discharge and can be followed up on next visit.   Procedures:    Consultations:  nephrology  Discharge Exam: Vitals:   01/23/16 1500 01/23/16 1520  BP: (!) 167/100 (!) 160/91  Pulse: 94 100  Resp:  16  Temp:  98 F (36.7 C)    General: NAD Cardiovascular: S1, S2 RRR Respiratory: CTA B  Discharge Instructions   Discharge Instructions    Diet - low sodium heart healthy    Complete by:  As directed    Increase activity slowly    Complete by:  As directed      Current Discharge Medication List    CONTINUE these medications which have CHANGED   Details  furosemide (LASIX) 40 MG tablet Take 3 tablets (120 mg total) by mouth daily. Qty: 90 tablet, Refills: 0      CONTINUE these medications which have NOT CHANGED   Details  acetaminophen (TYLENOL) 500 MG  tablet Take 1,000 mg by mouth every 6 (six) hours as needed for moderate pain. Reported on 06/12/2015    aspirin-acetaminophen-caffeine (EXCEDRIN MIGRAINE) 250-250-65 MG tablet Take 2 tablets by mouth every 6 (six) hours as needed for headache.    folic acid (FOLVITE) 1 MG tablet Take 1 tablet (1 mg total) by mouth daily. Qty: 30 tablet, Refills: 0    metoprolol (LOPRESSOR) 50 MG tablet Take 1 tablet (50 mg total) by mouth 2 (two) times daily. Qty: 60 tablet, Refills: 0    predniSONE (DELTASONE) 20 MG tablet Take 1 tablet (20 mg total) by mouth daily with breakfast. Qty: 30 tablet, Refills: 6    risperiDONE microspheres (RISPERDAL CONSTA) 25 MG injection Inject 25 mg into the muscle every 14 (fourteen) days.    sevelamer carbonate (RENVELA) 800 MG tablet Take 4 tablets (3,200 mg total) by mouth 3 (three) times daily with meals. Qty: 360 tablet, Refills: 6    traMADol (ULTRAM) 50 MG tablet Take 1 tablet (50 mg total) by mouth every 6 (six) hours as needed for moderate pain. Qty: 30 tablet, Refills: 0       Allergies  Allergen Reactions  . Ativan [Lorazepam] Swelling and Other (See Comments)    Dysarthria(patient has difficulty speaking and slurred speech); denies swelling, itching, pain, or numbness.  [name] [Ziprasidone Hcl] Itching and Swelling    Tongue swelling  .  Keflex [Cephalexin] Swelling and Other (See Comments)    Tongue swelling. Can't talk   . Haldol [Haloperidol Lactate] Swelling    Tongue swelling. 05/31/15 - MD ok with giving as pt has tolerated in the past Pt can take benadryl.  . Other Itching    wool      The results of significant diagnostics from this hospitalization (including imaging, microbiology, ancillary and laboratory) are listed below for reference.    Significant Diagnostic Studies: No results found.  Microbiology: No results found for this or any previous visit (from the past 240 hour(s)).   Labs: Basic Metabolic Panel:  Recent Labs Lab  01/18/16 1602 01/21/16 1203 01/23/16 1228  NA 134* 131* 132*  K 4.9 5.2* 5.4*  CL 97* 94* 95*  CO2 23 20* 23  GLUCOSE 126* 101* 111*  BUN 82* 104* 85*  CREATININE 12.68* 16.82* 14.87*  CALCIUM 9.4 9.3 9.3  PHOS 8.4* 10.0* 9.2*   Liver Function Tests:  Recent Labs Lab 01/18/16 1602 01/21/16 1203 01/23/16 1228  ALBUMIN 3.3* 3.2* 3.2*   No results for input(s): LIPASE, AMYLASE in the last 168 hours. No results for input(s): AMMONIA in the last 168 hours. CBC:  Recent Labs Lab 01/18/16 1602 01/21/16 1203 01/23/16 1228  WBC 6.3 8.6 7.9  HGB 8.3* 7.8* 8.4*  HCT 25.9* 24.0* 26.1*  MCV 81.2 80.8 80.8  PLT 277 264 327   Cardiac Enzymes: No results for input(s): CKTOTAL, CKMB, CKMBINDEX, TROPONINI in the last 168 hours. BNP: BNP (last 3 results) No results for input(s): BNP in the last 8760 hours.  ProBNP (last 3 results) No results for input(s): PROBNP in the last 8760 hours.  CBG: No results for input(s): GLUCAP in the last 168 hours.     SignedKathie Dike MD.  [facility] 01/23/2016, 4:43 PM
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
## 2016-01-23 NOTE — ED Triage Notes (Signed)
Pt here to receive dialysis. No complaints. States dialysis nurse will not be here until 12-1 pm

## 2016-01-23 NOTE — Progress Notes (Addendum)
Patient states that she wants to leave AMA. MD notified via E-Page. Pt informed of all the risks of leaving against medical advice. Pt has indicated that she understands the risks and has decided to wait for her discharge orders. Pt also requesting prescription for Lasix be sent to [facility]. MD informed.

## 2016-01-23 NOTE — Progress Notes (Signed)
Discharge instructions including medications and follow up appointments were reviewed and discussed with patient. Pt verbalized understanding of discharge instructions including medications and follow up appointments. Pt in stable condition and in no acute distress at time of discharge. Pt will be escorted by nurse tech.

## 2016-01-24 LAB — T3, FREE: T3, Free: 3.4 pg/mL (ref 2.0–4.4)

## 2016-01-25 ENCOUNTER — Encounter (HOSPITAL_COMMUNITY): Payer: Self-pay | Admitting: Emergency Medicine

## 2016-01-25 ENCOUNTER — Observation Stay (HOSPITAL_COMMUNITY)
Admission: EM | Admit: 2016-01-25 | Discharge: 2016-01-25 | Discharge status: Against Medical Advice | Payer: Medicare Other | Attending: Internal Medicine | Admitting: Internal Medicine

## 2016-01-25 DIAGNOSIS — M328 Other forms of systemic lupus erythematosus: Secondary | ICD-10-CM | POA: Diagnosis not present

## 2016-01-25 DIAGNOSIS — Z7952 Long term (current) use of systemic steroids: Secondary | ICD-10-CM | POA: Diagnosis not present

## 2016-01-25 DIAGNOSIS — Z992 Dependence on renal dialysis: Secondary | ICD-10-CM | POA: Diagnosis not present

## 2016-01-25 DIAGNOSIS — I132 Hypertensive heart and chronic kidney disease with heart failure and with stage 5 chronic kidney disease, or end stage renal disease: Principal | ICD-10-CM | POA: Insufficient documentation

## 2016-01-25 DIAGNOSIS — M329 Systemic lupus erythematosus, unspecified: Secondary | ICD-10-CM | POA: Diagnosis present

## 2016-01-25 DIAGNOSIS — N186 End stage renal disease: Secondary | ICD-10-CM | POA: Diagnosis not present

## 2016-01-25 DIAGNOSIS — F319 Bipolar disorder, unspecified: Secondary | ICD-10-CM | POA: Insufficient documentation

## 2016-01-25 DIAGNOSIS — Z9119 Patient's noncompliance with other medical treatment and regimen: Secondary | ICD-10-CM | POA: Insufficient documentation

## 2016-01-25 DIAGNOSIS — F17210 Nicotine dependence, cigarettes, uncomplicated: Secondary | ICD-10-CM | POA: Insufficient documentation

## 2016-01-25 DIAGNOSIS — D631 Anemia in chronic kidney disease: Secondary | ICD-10-CM | POA: Diagnosis not present

## 2016-01-25 DIAGNOSIS — I5022 Chronic systolic (congestive) heart failure: Secondary | ICD-10-CM | POA: Insufficient documentation

## 2016-01-25 DIAGNOSIS — I10 Essential (primary) hypertension: Secondary | ICD-10-CM | POA: Diagnosis not present

## 2016-01-25 DIAGNOSIS — Z7982 Long term (current) use of aspirin: Secondary | ICD-10-CM | POA: Insufficient documentation

## 2016-01-25 DIAGNOSIS — F250 Schizoaffective disorder, bipolar type: Secondary | ICD-10-CM | POA: Diagnosis not present

## 2016-01-25 DIAGNOSIS — Z79899 Other long term (current) drug therapy: Secondary | ICD-10-CM | POA: Insufficient documentation

## 2016-01-25 DIAGNOSIS — M3214 Glomerular disease in systemic lupus erythematosus: Secondary | ICD-10-CM | POA: Insufficient documentation

## 2016-01-25 DIAGNOSIS — E8770 Fluid overload, unspecified: Secondary | ICD-10-CM | POA: Diagnosis not present

## 2016-01-25 DIAGNOSIS — I120 Hypertensive chronic kidney disease with stage 5 chronic kidney disease or end stage renal disease: Secondary | ICD-10-CM | POA: Diagnosis not present

## 2016-01-25 MED ORDER — SEVELAMER CARBONATE 800 MG PO TABS: 3200.0000 mg | ORAL_TABLET | Freq: Three times a day (TID) | ORAL | Status: DC

## 2016-01-25 MED ORDER — EPOETIN ALFA 20000 UNIT/ML IJ SOLN
INTRAMUSCULAR | Status: AC
  Filled 2016-01-25: qty 1

## 2016-01-25 MED ORDER — SODIUM CHLORIDE 0.9 % IV SOLN: 100.0000 mL | INTRAVENOUS | Status: DC | PRN

## 2016-01-25 MED ORDER — EPOETIN ALFA 20000 UNIT/ML IJ SOLN
14000.0000 [IU] | INTRAMUSCULAR | Status: DC
  Filled 2016-01-25 (×2): qty 1

## 2016-01-25 MED ORDER — ALTEPLASE 2 MG IJ SOLR
2.0000 mg | Freq: Once | INTRAMUSCULAR | Status: DC | PRN
  Filled 2016-01-25: qty 2

## 2016-01-25 MED ORDER — HEPARIN SODIUM (PORCINE) 1000 UNIT/ML IJ SOLN
INTRAMUSCULAR | Status: AC
  Filled 2016-01-25: qty 6

## 2016-01-25 MED ORDER — HEPARIN SODIUM (PORCINE) 1000 UNIT/ML DIALYSIS
1000.0000 [IU] | INTRAMUSCULAR | Status: DC | PRN
  Filled 2016-01-25: qty 1

## 2016-01-25 MED ORDER — FUROSEMIDE 40 MG PO TABS: 120.0000 mg | ORAL_TABLET | Freq: Every day | ORAL | Status: DC

## 2016-01-25 MED ORDER — DIPHENHYDRAMINE HCL 25 MG PO CAPS: 25.0000 mg | ORAL_CAPSULE | Freq: Four times a day (QID) | ORAL | Status: DC | PRN

## 2016-01-25 MED ORDER — DIPHENHYDRAMINE HCL 25 MG PO CAPS
ORAL_CAPSULE | ORAL | Status: AC
  Filled 2016-01-25: qty 1

## 2016-01-25 MED ORDER — PENTAFLUOROPROP-TETRAFLUOROETH EX AERO: 1.0000 "application " | INHALATION_SPRAY | CUTANEOUS | Status: DC | PRN

## 2016-01-25 MED ORDER — SEVELAMER CARBONATE 800 MG PO TABS
3200.0000 mg | ORAL_TABLET | Freq: Three times a day (TID) | ORAL | Status: DC
  Filled 2016-01-25 (×10): qty 4

## 2016-01-25 MED ORDER — FOLIC ACID 1 MG PO TABS: 1.0000 mg | ORAL_TABLET | Freq: Every day | ORAL | Status: DC

## 2016-01-25 MED ORDER — TRAMADOL HCL 50 MG PO TABS: 50.0000 mg | ORAL_TABLET | Freq: Four times a day (QID) | ORAL | Status: DC | PRN

## 2016-01-25 MED ORDER — SEVELAMER CARBONATE 800 MG PO TABS
ORAL_TABLET | ORAL | Status: AC
  Filled 2016-01-25: qty 4

## 2016-01-25 MED ORDER — PREDNISONE 20 MG PO TABS: 20.0000 mg | ORAL_TABLET | Freq: Every day | ORAL | Status: DC

## 2016-01-25 MED ORDER — HEPARIN SODIUM (PORCINE) 1000 UNIT/ML DIALYSIS
20.0000 [IU]/kg | INTRAMUSCULAR | Status: DC | PRN
  Filled 2016-01-25: qty 2

## 2016-01-25 MED ORDER — LIDOCAINE-PRILOCAINE 2.5-2.5 % EX CREA: 1.0000 "application " | TOPICAL_CREAM | CUTANEOUS | Status: DC | PRN

## 2016-01-25 MED ORDER — LIDOCAINE HCL (PF) 1 % IJ SOLN
INTRAMUSCULAR | Status: AC
  Filled 2016-01-25: qty 5

## 2016-01-25 MED ORDER — TRAMADOL HCL 50 MG PO TABS
ORAL_TABLET | ORAL | Status: AC
  Filled 2016-01-25: qty 1

## 2016-01-25 MED ORDER — LIDOCAINE HCL (PF) 1 % IJ SOLN
5.0000 mL | INTRAMUSCULAR | Status: DC | PRN
Start: 2016-01-25 — End: 2016-01-25

## 2016-01-25 MED ORDER — DOXERCALCIFEROL 4 MCG/2ML IV SOLN
INTRAVENOUS | Status: AC
  Filled 2016-01-25: qty 4

## 2016-01-25 MED ORDER — METOPROLOL TARTRATE 50 MG PO TABS: 50.0000 mg | ORAL_TABLET | Freq: Two times a day (BID) | ORAL | Status: DC

## 2016-01-25 NOTE — ED Notes (Signed)
Pt understands risks of leaving ama.
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
## 2016-01-25 NOTE — ED Notes (Signed)
Pt refusing IV at this time.

## 2016-01-25 NOTE — ED Notes (Signed)
Dr. Thurnell Garbe at bedside speaking with pt.  Pt is ambulatory at discharge.

## 2016-01-25 NOTE — ED Notes (Signed)
Pt having family emergency that she has to attend, has agreed to sign out ama and informed nurse that she would be back later this afternoon to receive dialysis.

## 2016-01-25 NOTE — ED Notes (Signed)
PT called for triage and registration stated "Pt stated she would be back and she went to get breakfast."
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
## 2016-01-25 NOTE — ED Notes (Signed)
Levada Dy notified that pt left ama and will possibly return this afternoon.

## 2016-01-25 NOTE — ED Notes (Signed)
Pt given cup of icewater, warm blankets, and a pillow.

## 2016-01-25 NOTE — ED Triage Notes (Signed)
Here to evaluated for HD
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
## 2016-01-30 ENCOUNTER — Encounter (HOSPITAL_COMMUNITY): Payer: Self-pay

## 2016-01-30 ENCOUNTER — Observation Stay (HOSPITAL_COMMUNITY)
Admission: EM | Admit: 2016-01-30 | Discharge: 2016-01-30 | Disposition: A | Discharge status: Home / Self Care | Payer: Medicare Other | Attending: Emergency Medicine | Admitting: Emergency Medicine

## 2016-01-30 DIAGNOSIS — Z7982 Long term (current) use of aspirin: Secondary | ICD-10-CM | POA: Diagnosis not present

## 2016-01-30 DIAGNOSIS — Z992 Dependence on renal dialysis: Secondary | ICD-10-CM | POA: Diagnosis not present

## 2016-01-30 DIAGNOSIS — F17210 Nicotine dependence, cigarettes, uncomplicated: Secondary | ICD-10-CM | POA: Insufficient documentation

## 2016-01-30 DIAGNOSIS — Z79899 Other long term (current) drug therapy: Secondary | ICD-10-CM | POA: Insufficient documentation

## 2016-01-30 DIAGNOSIS — M3214 Glomerular disease in systemic lupus erythematosus: Secondary | ICD-10-CM | POA: Insufficient documentation

## 2016-01-30 DIAGNOSIS — Z9119 Patient's noncompliance with other medical treatment and regimen: Secondary | ICD-10-CM | POA: Insufficient documentation

## 2016-01-30 DIAGNOSIS — N186 End stage renal disease: Secondary | ICD-10-CM

## 2016-01-30 DIAGNOSIS — D631 Anemia in chronic kidney disease: Secondary | ICD-10-CM | POA: Insufficient documentation

## 2016-01-30 DIAGNOSIS — I5022 Chronic systolic (congestive) heart failure: Secondary | ICD-10-CM | POA: Diagnosis not present

## 2016-01-30 DIAGNOSIS — R0600 Dyspnea, unspecified: Secondary | ICD-10-CM | POA: Diagnosis present

## 2016-01-30 DIAGNOSIS — I10 Essential (primary) hypertension: Secondary | ICD-10-CM | POA: Diagnosis not present

## 2016-01-30 DIAGNOSIS — Z7952 Long term (current) use of systemic steroids: Secondary | ICD-10-CM | POA: Diagnosis not present

## 2016-01-30 DIAGNOSIS — I132 Hypertensive heart and chronic kidney disease with heart failure and with stage 5 chronic kidney disease, or end stage renal disease: Principal | ICD-10-CM | POA: Insufficient documentation

## 2016-01-30 DIAGNOSIS — F250 Schizoaffective disorder, bipolar type: Secondary | ICD-10-CM | POA: Insufficient documentation

## 2016-01-30 DIAGNOSIS — I120 Hypertensive chronic kidney disease with stage 5 chronic kidney disease or end stage renal disease: Secondary | ICD-10-CM | POA: Diagnosis not present

## 2016-01-30 LAB — RENAL FUNCTION PANEL
Albumin: 3.2 g/dL — ABNORMAL LOW (ref 3.5–5.0)
Anion gap: 16 — ABNORMAL HIGH (ref 5–15)
BUN: 118 mg/dL — ABNORMAL HIGH (ref 6–20)
CO2: 20 mmol/L — ABNORMAL LOW (ref 22–32)
Calcium: 9.2 mg/dL (ref 8.9–10.3)
Chloride: 96 mmol/L — ABNORMAL LOW (ref 101–111)
Creatinine, Ser: 16.55 mg/dL — ABNORMAL HIGH (ref 0.44–1.00)
GFR calc Af Amer: 3 mL/min — ABNORMAL LOW (ref >60)
GFR calc non Af Amer: 2 mL/min — ABNORMAL LOW (ref >60)
Glucose, Bld: 99 mg/dL (ref 65–99)
Phosphorus: 10.5 mg/dL — ABNORMAL HIGH (ref 2.5–4.6)
Potassium: 5.8 mmol/L — ABNORMAL HIGH (ref 3.5–5.1)
Sodium: 132 mmol/L — ABNORMAL LOW (ref 135–145)

## 2016-01-30 LAB — CBC
HCT: 24.5 % — ABNORMAL LOW (ref 36.0–46.0)
Hemoglobin: 8.1 g/dL — ABNORMAL LOW (ref 12.0–15.0)
MCH: 26.4 pg (ref 26.0–34.0)
MCHC: 33.1 g/dL (ref 30.0–36.0)
MCV: 79.8 fL (ref 78.0–100.0)
Platelets: 294 10*3/uL (ref 150–400)
RBC: 3.07 MIL/uL — ABNORMAL LOW (ref 3.87–5.11)
RDW: 18.3 % — ABNORMAL HIGH (ref 11.5–15.5)
WBC: 7.7 10*3/uL (ref 4.0–10.5)

## 2016-01-30 MED ORDER — FUROSEMIDE 40 MG PO TABS: 120.0000 mg | ORAL_TABLET | Freq: Every day | ORAL | Status: DC

## 2016-01-30 MED ORDER — LIDOCAINE HCL (PF) 1 % IJ SOLN
INTRAMUSCULAR | Status: AC
  Administered 2016-01-30: 0.5 mL via INTRADERMAL
  Filled 2016-01-30: qty 5

## 2016-01-30 MED ORDER — RISPERIDONE MICROSPHERES 25 MG IM SUSR: 25.0000 mg | INTRAMUSCULAR | Status: DC

## 2016-01-30 MED ORDER — PREDNISONE 20 MG PO TABS: 20.0000 mg | ORAL_TABLET | Freq: Every day | ORAL | Status: DC

## 2016-01-30 MED ORDER — SODIUM CHLORIDE 0.9 % IV SOLN: 100.0000 mL | INTRAVENOUS | Status: DC | PRN

## 2016-01-30 MED ORDER — TRAMADOL HCL 50 MG PO TABS
ORAL_TABLET | ORAL | Status: AC
  Administered 2016-01-30: 50 mg via ORAL
  Filled 2016-01-30: qty 1

## 2016-01-30 MED ORDER — FOLIC ACID 1 MG PO TABS: 1.0000 mg | ORAL_TABLET | Freq: Every day | ORAL | Status: DC

## 2016-01-30 MED ORDER — LIDOCAINE HCL (PF) 1 % IJ SOLN
5.0000 mL | INTRAMUSCULAR | Status: DC | PRN
  Administered 2016-01-30: 0.5 mL via INTRADERMAL

## 2016-01-30 MED ORDER — TRAMADOL HCL 50 MG PO TABS
50.0000 mg | ORAL_TABLET | Freq: Four times a day (QID) | ORAL | Status: DC | PRN
  Administered 2016-01-30: 50 mg via ORAL

## 2016-01-30 MED ORDER — DIPHENHYDRAMINE HCL 25 MG PO CAPS
25.0000 mg | ORAL_CAPSULE | Freq: Four times a day (QID) | ORAL | Status: DC | PRN
  Administered 2016-01-30: 25 mg via ORAL

## 2016-01-30 MED ORDER — EPOETIN ALFA 10000 UNIT/ML IJ SOLN
INTRAMUSCULAR | Status: AC
  Administered 2016-01-30: 6000 [IU] via INTRAVENOUS
  Filled 2016-01-30: qty 1

## 2016-01-30 MED ORDER — DOXERCALCIFEROL 4 MCG/2ML IV SOLN
5.0000 ug | Freq: Once | INTRAVENOUS | Status: AC
  Administered 2016-01-30: 5 ug via INTRAVENOUS
  Filled 2016-01-30: qty 4

## 2016-01-30 MED ORDER — HEPARIN SODIUM (PORCINE) 1000 UNIT/ML IJ SOLN
INTRAMUSCULAR | Status: AC
  Administered 2016-01-30: 1400 [IU] via INTRAVENOUS_CENTRAL
  Filled 2016-01-30: qty 6

## 2016-01-30 MED ORDER — DIPHENHYDRAMINE HCL 25 MG PO CAPS
ORAL_CAPSULE | ORAL | Status: AC
  Administered 2016-01-30: 25 mg via ORAL
  Filled 2016-01-30: qty 1

## 2016-01-30 MED ORDER — METOPROLOL TARTRATE 50 MG PO TABS
50.0000 mg | ORAL_TABLET | Freq: Two times a day (BID) | ORAL | Status: DC
  Filled 2016-01-30: qty 1

## 2016-01-30 MED ORDER — EPOETIN ALFA 10000 UNIT/ML IJ SOLN
6000.0000 [IU] | Freq: Once | INTRAMUSCULAR | Status: AC
  Administered 2016-01-30: 6000 [IU] via INTRAVENOUS
  Filled 2016-01-30: qty 0.6

## 2016-01-30 MED ORDER — ASPIRIN-ACETAMINOPHEN-CAFFEINE 250-250-65 MG PO TABS: 2.0000 | ORAL_TABLET | Freq: Four times a day (QID) | ORAL | Status: DC | PRN

## 2016-01-30 MED ORDER — LIDOCAINE-PRILOCAINE 2.5-2.5 % EX CREA: 1.0000 "application " | TOPICAL_CREAM | CUTANEOUS | Status: DC | PRN

## 2016-01-30 MED ORDER — SEVELAMER CARBONATE 800 MG PO TABS: 3200.0000 mg | ORAL_TABLET | Freq: Three times a day (TID) | ORAL | Status: DC

## 2016-01-30 MED ORDER — HEPARIN SODIUM (PORCINE) 1000 UNIT/ML DIALYSIS
20.0000 [IU]/kg | INTRAMUSCULAR | Status: DC | PRN
  Administered 2016-01-30: 1400 [IU] via INTRAVENOUS_CENTRAL

## 2016-01-30 MED ORDER — ACETAMINOPHEN 500 MG PO TABS: 1000.0000 mg | ORAL_TABLET | Freq: Four times a day (QID) | ORAL | Status: DC | PRN

## 2016-01-30 MED ORDER — PENTAFLUOROPROP-TETRAFLUOROETH EX AERO: 1.0000 "application " | INHALATION_SPRAY | CUTANEOUS | Status: DC | PRN

## 2016-01-30 MED ORDER — DOXERCALCIFEROL 4 MCG/2ML IV SOLN
INTRAVENOUS | Status: AC
  Administered 2016-01-30: 5 ug via INTRAVENOUS
  Filled 2016-01-30: qty 4

## 2016-01-30 NOTE — Consult Note (Signed)
Sara [name] MRN: [id number] DOB/AGE: April 20, 1980 35 y.o. Primary Care Physician:Pcp Not In System Admit date: 01/30/2016 Chief Complaint:  Chief Complaint  Patient presents with  . wants dialysis   HPI: Pt is 35 year old female with past medical hx of ESRD who was admitted with c/o dyspnea," I need dialysis "  HPI dates 3-4 days ago as pt came to ER asking for her dialysis. Pt says " I need my dialysis ,My last dialysis was last Wednesday ." No c/o fever/cough/chills NO c/o nausea/vomiting. NO c/o abdominal pain. No c/o hematuria . NO c/o syncope.     Past Medical History:  Diagnosis Date  . Acute myopericarditis    hx/notes 10/09/2014  . Bipolar disorder ([place])    [name] 10/09/2014  . CHF (congestive heart failure) ([place])    systolic/notes [id number]  . Chronic anemia    [name] 10/09/2014  . Current use of steroid medication 12/21/2015  . ESRD (end stage renal disease) on dialysis [facility])    "[name]; [facility]" (10/09/2014)  . History of blood transfusion    "this is probably my 3rd" (10/09/2014)  . Hypertension   . Low back pain   . Lupus    lupus w nephritis  . 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 Non-compliant patient   . Positive ANA (antinuclear antibody) 08/16/2012  . Positive Smith antibody 08/16/2012  . Pregnancy   . Psychosis   . Schizoaffective disorder, bipolar type ([place]) 11/20/2014  . Schizophrenia ([place])    [name] 10/09/2014  . Tobacco abuse 02/20/2014        Family History  Problem Relation Age of Onset  . Drug abuse Father   . Kidney disease Father     Social History:  reports that she has been smoking Cigarettes.  She has a 2.00 pack-year smoking history. She has never used smokeless tobacco. She reports that she does not drink alcohol or use drugs.   Allergies:  Allergies  Allergen Reactions  . Ativan [Lorazepam] Swelling and Other (See Comments)    Dysarthria(patient has difficulty speaking and slurred speech); denies swelling, itching, pain, or numbness.   [name] [Ziprasidone Hcl] Itching and Swelling    Tongue swelling  . Keflex [Cephalexin] Swelling and Other (See Comments)    Tongue swelling. Can't talk   . Haldol [Haloperidol Lactate] Swelling    Tongue swelling. 05/31/15 - MD ok with giving as pt has tolerated in the past Pt can take benadryl.  . Other Itching    wool     (Not in a hospital admission)     [id number] from the symptoms mentioned above,there are no other symptoms referable to all systems reviewed.      Physical Exam: Vital signs in last 24 hours: Temp:  [97.6 F (36.4 C)] 97.6 F (36.4 C) (10/25 0733) Pulse Rate:  [102] 102 (10/25 0733) Resp:  [20] 20 (10/25 0733) BP: (175)/(126) 175/126 (10/25 0733) SpO2:  [96 %] 96 % (10/25 0733) Weight:  [150 lb (68 kg)] 150 lb (68 kg) (10/25 0736) Weight change:     Intake/Output from previous day: No intake/output data recorded. No intake/output data recorded.   Physical Exam: General- pt is awake,alert, follows comands Resp- No acute REsp distress, chest clear to asuculatation CVS- S1S2 regular in rate and rhythm, NO rubs  GIT- BS+, soft, NT, ND EXT- No LE Edema, NO Cyanosis CNS- CN 2-12 grossly intact. Moving all 4 extremities Access- AVF+    Lab Results:  CBC    Component  Value Date/Time   WBC 7.9 01/23/2016 1228   RBC 3.23 (L) 01/23/2016 1228   HGB 8.4 (L) 01/23/2016 1228   HCT 26.1 (L) 01/23/2016 1228   PLT 327 01/23/2016 1228   MCV 80.8 01/23/2016 1228   MCH 26.0 01/23/2016 1228   MCHC 32.2 01/23/2016 1228   RDW 18.4 (H) 01/23/2016 1228   LYMPHSABS 2.2 12/21/2015 0928   MONOABS 0.7 12/21/2015 0928   EOSABS 0.3 12/21/2015 0928   BASOSABS 0.0 12/21/2015 0928      BMET BMP Latest Ref Rng & Units 01/23/2016 01/21/2016 01/18/2016  Glucose 65 - 99 mg/dL 111(H) 101(H) 126(H)  BUN 6 - 20 mg/dL 85(H) 104(H) 82(H)  Creatinine 0.44 - 1.00 mg/dL 14.87(H) 16.82(H) 12.68(H)  Sodium 135 - 145 mmol/L 132(L) 131(L) 134(L)  Potassium 3.5 -  5.1 mmol/L 5.4(H) 5.2(H) 4.9  Chloride 101 - 111 mmol/L 95(L) 94(L) 97(L)  CO2 22 - 32 mmol/L 23 20(L) 23  Calcium 8.9 - 10.3 mg/dL 9.3 9.3 9.4         Lab Results  Component Value Date   PTH 1,100 (H) 11/19/2015   PTH Comment 11/19/2015   CALCIUM 9.3 01/23/2016   CAION 1.18 12/17/2015   PHOS 9.2 (H) 01/23/2016      Impression: 1)Renal  ESRD on HD                Pt is not on regular  Schedule sec to her complaince/adherence issues                Will dialyze pt today  2)HTN Bp is not at goal Hx of non adherence to medical tx  3)Anemia In ESRD the goal for HGb is 9--11. 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   Not at goal sec to non adherence to hd tx   Will keep on zemplar/Hectrol  Phosphorus not at goal.  on binders  5)Psych .  Hx of   psycosis Schizophrenia Primary MD following  6)Electrolytes  Hx of Hyperkalemia   Hx of Hyponatremia   7)Acid base Co2  at goal    Plan:  Will dialyze today Will use 2 k bath Will keep on epo-4000 units  Will try to take 2.5 liters off if possible  I again educated pt about need to adhere to medical tx.   BHUTANI,MANPREET S 01/30/2016, 9:29 AM

## 2016-01-30 NOTE — ED Notes (Signed)
Pt has swelling to abd and legs.  Denies sob.

## 2016-01-30 NOTE — ED Triage Notes (Signed)
Pt here for dialysis.  Last treatment was Wednesday of last week.  Denies complaints
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
## 2016-01-31 LAB — HEPATITIS B SURFACE ANTIGEN: Hepatitis B Surface Ag: NEGATIVE

## 2016-02-01 ENCOUNTER — Observation Stay (HOSPITAL_COMMUNITY)
Admission: EM | Admit: 2016-02-01 | Discharge: 2016-02-01 | Discharge status: Against Medical Advice | Payer: Medicare Other | Attending: Internal Medicine | Admitting: Internal Medicine

## 2016-02-01 ENCOUNTER — Encounter (HOSPITAL_COMMUNITY): Payer: Self-pay | Admitting: Emergency Medicine

## 2016-02-01 DIAGNOSIS — I10 Essential (primary) hypertension: Secondary | ICD-10-CM | POA: Diagnosis not present

## 2016-02-01 DIAGNOSIS — Z79899 Other long term (current) drug therapy: Secondary | ICD-10-CM | POA: Insufficient documentation

## 2016-02-01 DIAGNOSIS — I5022 Chronic systolic (congestive) heart failure: Secondary | ICD-10-CM | POA: Diagnosis not present

## 2016-02-01 DIAGNOSIS — D631 Anemia in chronic kidney disease: Secondary | ICD-10-CM | POA: Diagnosis present

## 2016-02-01 DIAGNOSIS — F250 Schizoaffective disorder, bipolar type: Secondary | ICD-10-CM | POA: Diagnosis not present

## 2016-02-01 DIAGNOSIS — F17210 Nicotine dependence, cigarettes, uncomplicated: Secondary | ICD-10-CM | POA: Insufficient documentation

## 2016-02-01 DIAGNOSIS — Z992 Dependence on renal dialysis: Secondary | ICD-10-CM | POA: Diagnosis not present

## 2016-02-01 DIAGNOSIS — N189 Chronic kidney disease, unspecified: Secondary | ICD-10-CM | POA: Diagnosis present

## 2016-02-01 DIAGNOSIS — D649 Anemia, unspecified: Secondary | ICD-10-CM | POA: Diagnosis not present

## 2016-02-01 DIAGNOSIS — N186 End stage renal disease: Secondary | ICD-10-CM | POA: Diagnosis not present

## 2016-02-01 DIAGNOSIS — Z9119 Patient's noncompliance with other medical treatment and regimen: Secondary | ICD-10-CM | POA: Insufficient documentation

## 2016-02-01 DIAGNOSIS — M898X9 Other specified disorders of bone, unspecified site: Secondary | ICD-10-CM | POA: Diagnosis not present

## 2016-02-01 DIAGNOSIS — Z7952 Long term (current) use of systemic steroids: Secondary | ICD-10-CM | POA: Insufficient documentation

## 2016-02-01 DIAGNOSIS — I110 Hypertensive heart disease with heart failure: Secondary | ICD-10-CM | POA: Insufficient documentation

## 2016-02-01 DIAGNOSIS — M3214 Glomerular disease in systemic lupus erythematosus: Principal | ICD-10-CM | POA: Insufficient documentation

## 2016-02-01 DIAGNOSIS — I120 Hypertensive chronic kidney disease with stage 5 chronic kidney disease or end stage renal disease: Secondary | ICD-10-CM | POA: Diagnosis not present

## 2016-02-01 LAB — CBC
HEMATOCRIT: 25.6 % — AB (ref 36.0–46.0)
Hemoglobin: 8.2 g/dL — ABNORMAL LOW (ref 12.0–15.0)
MCH: 25.8 pg — AB (ref 26.0–34.0)
MCHC: 32 g/dL (ref 30.0–36.0)
MCV: 80.5 fL (ref 78.0–100.0)
Platelets: 299 10*3/uL (ref 150–400)
RBC: 3.18 MIL/uL — ABNORMAL LOW (ref 3.87–5.11)
RDW: 18.2 % — AB (ref 11.5–15.5)
WBC: 7.5 10*3/uL (ref 4.0–10.5)

## 2016-02-01 LAB — RENAL FUNCTION PANEL
ALBUMIN: 3.1 g/dL — AB (ref 3.5–5.0)
Anion gap: 13 (ref 5–15)
BUN: 73 mg/dL — AB (ref 6–20)
CHLORIDE: 97 mmol/L — AB (ref 101–111)
CO2: 24 mmol/L (ref 22–32)
Calcium: 9.4 mg/dL (ref 8.9–10.3)
Creatinine, Ser: 12.84 mg/dL — ABNORMAL HIGH (ref 0.44–1.00)
GFR calc Af Amer: 4 mL/min — ABNORMAL LOW (ref >60)
GFR calc non Af Amer: 3 mL/min — ABNORMAL LOW (ref >60)
GLUCOSE: 106 mg/dL — AB (ref 65–99)
POTASSIUM: 5.4 mmol/L — AB (ref 3.5–5.1)
Phosphorus: 7.8 mg/dL — ABNORMAL HIGH (ref 2.5–4.6)
Sodium: 134 mmol/L — ABNORMAL LOW (ref 135–145)

## 2016-02-01 MED ORDER — DIPHENHYDRAMINE HCL 25 MG PO CAPS
ORAL_CAPSULE | ORAL | Status: AC
  Filled 2016-02-01: qty 1

## 2016-02-01 MED ORDER — EPOETIN ALFA 4000 UNIT/ML IJ SOLN
INTRAMUSCULAR | Status: AC
  Administered 2016-02-01: 4000 [IU]
  Filled 2016-02-01: qty 1

## 2016-02-01 MED ORDER — LIDOCAINE-PRILOCAINE 2.5-2.5 % EX CREA
1.0000 | TOPICAL_CREAM | CUTANEOUS | Status: DC | PRN
Start: 2016-02-01 — End: 2016-02-01
  Filled 2016-02-01: qty 5

## 2016-02-01 MED ORDER — HEPARIN SODIUM (PORCINE) 1000 UNIT/ML DIALYSIS: 1000.0000 [IU] | INTRAMUSCULAR | Status: DC | PRN

## 2016-02-01 MED ORDER — SODIUM CHLORIDE 0.9 % IV SOLN: 100.0000 mL | INTRAVENOUS | Status: DC | PRN

## 2016-02-01 MED ORDER — TRAMADOL HCL 50 MG PO TABS
ORAL_TABLET | ORAL | Status: AC
  Filled 2016-02-01: qty 1

## 2016-02-01 MED ORDER — DIPHENHYDRAMINE HCL 25 MG PO CAPS
25.0000 mg | ORAL_CAPSULE | Freq: Four times a day (QID) | ORAL | Status: DC | PRN
  Administered 2016-02-01: 25 mg via ORAL

## 2016-02-01 MED ORDER — EPOETIN ALFA 20000 UNIT/ML IJ SOLN
14000.0000 [IU] | INTRAMUSCULAR | Status: DC
  Filled 2016-02-01 (×2): qty 1

## 2016-02-01 MED ORDER — HEPARIN SODIUM (PORCINE) 1000 UNIT/ML DIALYSIS
20.0000 [IU]/kg | INTRAMUSCULAR | Status: DC | PRN
  Administered 2016-02-01: 1400 [IU] via INTRAVENOUS_CENTRAL

## 2016-02-01 MED ORDER — TRAMADOL HCL 50 MG PO TABS
50.0000 mg | ORAL_TABLET | Freq: Four times a day (QID) | ORAL | Status: DC | PRN
  Administered 2016-02-01: 50 mg via ORAL

## 2016-02-01 MED ORDER — LIDOCAINE HCL (PF) 1 % IJ SOLN
5.0000 mL | INTRAMUSCULAR | Status: DC | PRN
  Administered 2016-02-01: 5 mL via INTRADERMAL

## 2016-02-01 MED ORDER — PENTAFLUOROPROP-TETRAFLUOROETH EX AERO
1.0000 "application " | INHALATION_SPRAY | CUTANEOUS | Status: DC | PRN
  Filled 2016-02-01: qty 30

## 2016-02-01 MED ORDER — ALTEPLASE 2 MG IJ SOLR: 2.0000 mg | Freq: Once | INTRAMUSCULAR | Status: DC | PRN

## 2016-02-01 MED ORDER — LIDOCAINE HCL (PF) 1 % IJ SOLN
INTRAMUSCULAR | Status: AC
  Administered 2016-02-01: 5 mL via INTRADERMAL
  Filled 2016-02-01: qty 5

## 2016-02-01 MED ORDER — EPOETIN ALFA 10000 UNIT/ML IJ SOLN
INTRAMUSCULAR | Status: AC
  Administered 2016-02-01: 10000 [IU]
  Filled 2016-02-01: qty 1

## 2016-02-01 MED ORDER — SEVELAMER CARBONATE 800 MG PO TABS
3200.0000 mg | ORAL_TABLET | Freq: Three times a day (TID) | ORAL | Status: DC
  Filled 2016-02-01 (×10): qty 4

## 2016-02-01 NOTE — ED Notes (Signed)
Dialysis nurse 10 minutes out. Will wait to transport when she gets here. Pt sleeping

## 2016-02-01 NOTE — Progress Notes (Signed)
Pt received from ED via wheelchair. Has no complaints. Dialysis initiated at 1111 via L AVF without issue. Treatment time 4.5h and 4L goal per order. Renal and cbc sent to lab per order. Continue to monitor

## 2016-02-01 NOTE — ED Triage Notes (Signed)
Pt here for dialysis. No complaints.
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
## 2016-02-01 NOTE — Progress Notes (Signed)
Hemodialysis- Pt received phone call then stated "im ready to come off now." Discussed the importance of adhering to treatment time with patient. "I will next time." All blood returned and treatment discontinued. Unable to meet goal d/t signing off early. Pt signed out of hospital AMA and left dialysis unit on foot. Vitals stable post Dialysis and patient had no complaints. ED informed of patients departure. Nephrologist informed.
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
## 2016-02-04 ENCOUNTER — Observation Stay (HOSPITAL_COMMUNITY)
Admission: EM | Admit: 2016-02-04 | Discharge: 2016-02-04 | Discharge status: Against Medical Advice | Payer: Medicare Other | Attending: Internal Medicine | Admitting: Internal Medicine

## 2016-02-04 ENCOUNTER — Encounter (HOSPITAL_COMMUNITY): Payer: Self-pay | Admitting: Emergency Medicine

## 2016-02-04 DIAGNOSIS — I132 Hypertensive heart and chronic kidney disease with heart failure and with stage 5 chronic kidney disease, or end stage renal disease: Principal | ICD-10-CM | POA: Insufficient documentation

## 2016-02-04 DIAGNOSIS — Z7952 Long term (current) use of systemic steroids: Secondary | ICD-10-CM | POA: Insufficient documentation

## 2016-02-04 DIAGNOSIS — N19 Unspecified kidney failure: Secondary | ICD-10-CM | POA: Diagnosis present

## 2016-02-04 DIAGNOSIS — N186 End stage renal disease: Secondary | ICD-10-CM | POA: Diagnosis not present

## 2016-02-04 DIAGNOSIS — Z992 Dependence on renal dialysis: Secondary | ICD-10-CM | POA: Diagnosis not present

## 2016-02-04 DIAGNOSIS — I5022 Chronic systolic (congestive) heart failure: Secondary | ICD-10-CM | POA: Diagnosis not present

## 2016-02-04 DIAGNOSIS — I10 Essential (primary) hypertension: Secondary | ICD-10-CM | POA: Diagnosis not present

## 2016-02-04 DIAGNOSIS — F17210 Nicotine dependence, cigarettes, uncomplicated: Secondary | ICD-10-CM | POA: Diagnosis not present

## 2016-02-04 DIAGNOSIS — F250 Schizoaffective disorder, bipolar type: Secondary | ICD-10-CM | POA: Diagnosis not present

## 2016-02-04 DIAGNOSIS — Z7982 Long term (current) use of aspirin: Secondary | ICD-10-CM | POA: Diagnosis not present

## 2016-02-04 DIAGNOSIS — R531 Weakness: Secondary | ICD-10-CM | POA: Diagnosis not present

## 2016-02-04 DIAGNOSIS — M3214 Glomerular disease in systemic lupus erythematosus: Secondary | ICD-10-CM | POA: Diagnosis not present

## 2016-02-04 DIAGNOSIS — D631 Anemia in chronic kidney disease: Secondary | ICD-10-CM | POA: Diagnosis not present

## 2016-02-04 DIAGNOSIS — R0602 Shortness of breath: Secondary | ICD-10-CM | POA: Diagnosis present

## 2016-02-04 DIAGNOSIS — Z9119 Patient's noncompliance with other medical treatment and regimen: Secondary | ICD-10-CM | POA: Insufficient documentation

## 2016-02-04 DIAGNOSIS — Z79899 Other long term (current) drug therapy: Secondary | ICD-10-CM | POA: Insufficient documentation

## 2016-02-04 DIAGNOSIS — I120 Hypertensive chronic kidney disease with stage 5 chronic kidney disease or end stage renal disease: Secondary | ICD-10-CM | POA: Diagnosis not present

## 2016-02-04 DIAGNOSIS — N189 Chronic kidney disease, unspecified: Secondary | ICD-10-CM

## 2016-02-04 MED ORDER — EPOETIN ALFA 20000 UNIT/ML IJ SOLN: 14000.0000 [IU] | INTRAMUSCULAR | Status: DC

## 2016-02-04 MED ORDER — SEVELAMER CARBONATE 800 MG PO TABS: 3200.0000 mg | ORAL_TABLET | Freq: Three times a day (TID) | ORAL | Status: DC

## 2016-02-04 MED ORDER — DIPHENHYDRAMINE HCL 25 MG PO CAPS: 25.0000 mg | ORAL_CAPSULE | Freq: Four times a day (QID) | ORAL | Status: DC | PRN
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
## 2016-02-04 NOTE — ED Notes (Signed)
Pt at desk. States she "might have to leave and will come back later".

## 2016-02-04 NOTE — ED Notes (Signed)
Pt not in room. Pt seen sitting in waiting area.
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
## 2016-02-04 NOTE — Discharge Summary (Signed)
Physician Discharge Summary  Sara [name] [id number] DOB: 27-Jul-1980 DOA: 02/04/2016  PCP: Pcp Not In System  Admit date: 02/04/2016 Discharge date: 02/04/2016  Admitted From: Home Disposition: Home  Recommendations for Outpatient Follow-up:  1. Follow up with PCP in 1-2 weeks 2. Please obtain BMP/CBC in one week 3. Please follow up on the following pending results:  Home Health: NA Equipment/Devices:NA  Discharge Condition: Stable CODE STATUS: Prior Diet recommendation: Diet - low sodium heart healthy  Brief/Interim Summary: Sara [name] a 36 y.o.femalewith medical history significant of lupus, has ESRD on hemodialysis. Patient lives in [place] but she cannot be dialyzed over there. She came in to the ED for hemodialysis needs. She complained about minimal SOB.  Discharge Diagnoses:  Active Problems:   Kidney failure   ESRD on hemodialysis -Patient is here for elective dialysis, cannot be dialyzed as outpatient. -Nephrology consulted, Dr. Lowanda Foster dialyzed her. -Discharged after dialysis.  History of lupus -Her prednisone continued instructed to continue that. Questionable compliance.  Tobacco abuse -Patient smokes one pack per day, I have done extensive counseling personally.  Chronic systolic CHF -LVEF is [id number], compensated, continue home medications.  Schizophrenia -On Risperdal Consta, follow with psych as outpatient.   Discharge Instructions  Discharge Instructions    Diet - low sodium heart healthy    Complete by:  As directed    Increase activity slowly    Complete by:  As directed        Medication List    TAKE these medications   acetaminophen 500 MG tablet Commonly known as:  TYLENOL Take 1,000 mg by mouth every 6 (six) hours as needed for moderate pain. Reported on 06/12/2015   aspirin-acetaminophen-caffeine 250-250-65 MG tablet Commonly known as:  EXCEDRIN MIGRAINE Take 2 tablets by mouth every 6 (six) hours as  needed for headache.   folic acid 1 MG tablet Commonly known as:  FOLVITE Take 1 tablet (1 mg total) by mouth daily.   furosemide 40 MG tablet Commonly known as:  LASIX Take 3 tablets (120 mg total) by mouth daily.   metoprolol 50 MG tablet Commonly known as:  LOPRESSOR Take 1 tablet (50 mg total) by mouth 2 (two) times daily.   predniSONE 20 MG tablet Commonly known as:  DELTASONE Take 1 tablet (20 mg total) by mouth daily with breakfast.   risperiDONE microspheres 25 MG injection Commonly known as:  RISPERDAL CONSTA Inject 25 mg into the muscle every 14 (fourteen) days.   sevelamer carbonate 800 MG tablet Commonly known as:  RENVELA Take 4 tablets (3,200 mg total) by mouth 3 (three) times daily with meals.   traMADol 50 MG tablet Commonly known as:  ULTRAM Take 1 tablet (50 mg total) by mouth every 6 (six) hours as needed for moderate pain.       Allergies  Allergen Reactions  . Ativan [Lorazepam] Swelling and Other (See Comments)    Dysarthria(patient has difficulty speaking and slurred speech); denies swelling, itching, pain, or numbness.  [name] [Ziprasidone Hcl] Itching and Swelling    Tongue swelling  . Keflex [Cephalexin] Swelling and Other (See Comments)    Tongue swelling. Can't talk   . Haldol [Haloperidol Lactate] Swelling    Tongue swelling. 05/31/15 - MD ok with giving as pt has tolerated in the past Pt can take benadryl.  . Other Itching    wool    Consultations:  Treatment Team:   Fran Lowes, MD    Procedures (Echo, Carotid, EGD, Colonoscopy, ERCP)  Radiological studies:  No results found.  Subjective:  Discharge Exam: Vitals:   02/04/16 0715 02/04/16 0726  BP: (!) 154/118   Pulse: 109   Resp: 20   Temp:  97.8 F (36.6 C)  TempSrc:  Oral  SpO2: 100%   Weight: 68 kg (150 lb)   Height: 5\' 7"  (1.702 m)    General: Pt is alert, awake, not in acute distress Cardiovascular: RRR, S1/S2 +, no rubs, no  gallops Respiratory: CTA bilaterally, no wheezing, no rhonchi Abdominal: Soft, NT, ND, bowel sounds + Extremities: no edema, no cyanosis   The results of significant diagnostics from this hospitalization (including imaging, microbiology, ancillary and laboratory) are listed below for reference.    Microbiology: No results found for this or any previous visit (from the past 240 hour(s)).   Labs: BNP (last 3 results) No results for input(s): BNP in the last 8760 hours. Basic Metabolic Panel:  Recent Labs Lab 01/30/16 1430 02/01/16 1113  NA 132* 134*  K 5.8* 5.4*  CL 96* 97*  CO2 20* 24  GLUCOSE 99 106*  BUN 118* 73*  CREATININE 16.55* 12.84*  CALCIUM 9.2 9.4  PHOS 10.5* 7.8*   Liver Function Tests:  Recent Labs Lab 01/30/16 1430 02/01/16 1113  ALBUMIN 3.2* 3.1*   No results for input(s): LIPASE, AMYLASE in the last 168 hours. No results for input(s): AMMONIA in the last 168 hours. CBC:  Recent Labs Lab 01/30/16 1430 02/01/16 1113  WBC 7.7 7.5  HGB 8.1* 8.2*  HCT 24.5* 25.6*  MCV 79.8 80.5  PLT 294 299   Cardiac Enzymes: No results for input(s): CKTOTAL, CKMB, CKMBINDEX, TROPONINI in the last 168 hours. BNP: Invalid input(s): POCBNP CBG: No results for input(s): GLUCAP in the last 168 hours. D-Dimer No results for input(s): DDIMER in the last 72 hours. Hgb A1c No results for input(s): HGBA1C in the last 72 hours. Lipid Profile No results for input(s): CHOL, HDL, LDLCALC, TRIG, CHOLHDL, LDLDIRECT in the last 72 hours. Thyroid function studies No results for input(s): TSH, T4TOTAL, T3FREE, THYROIDAB in the last 72 hours.  Invalid input(s): FREET3 Anemia work up No results for input(s): VITAMINB12, FOLATE, FERRITIN, TIBC, IRON, RETICCTPCT in the last 72 hours. Urinalysis    Component Value Date/Time   COLORURINE YELLOW 08/11/2015 1235   APPEARANCEUR HAZY (A) 08/11/2015 1235   LABSPEC 1.015 08/11/2015 1235   PHURINE 7.5 08/11/2015 1235   GLUCOSEU  100 (A) 08/11/2015 1235   HGBUR SMALL (A) 08/11/2015 1235   BILIRUBINUR NEGATIVE 08/11/2015 1235   KETONESUR NEGATIVE 08/11/2015 1235   PROTEINUR >300 (A) 08/11/2015 1235   UROBILINOGEN 0.2 01/09/2015 1645   NITRITE NEGATIVE 08/11/2015 1235   LEUKOCYTESUR SMALL (A) 08/11/2015 1235   Sepsis Labs Invalid input(s): PROCALCITONIN,  WBC,  LACTICIDVEN Microbiology No results found for this or any previous visit (from the past 240 hour(s)).   Time coordinating discharge: Over 30 minutes  SIGNED:   Birdie Hopes, MD  [facility] 02/04/2016, 10:31 AM Pager   If 7PM-7AM, please contact night-coverage [email or web address] Password [name]

## 2016-02-04 NOTE — ED Triage Notes (Signed)
Patient here for dialysis. States last dialysis was Friday. Denies symptoms.

## 2016-02-05 ENCOUNTER — Observation Stay (HOSPITAL_COMMUNITY)
Admission: EM | Admit: 2016-02-05 | Discharge: 2016-02-05 | Disposition: A | Discharge status: Home / Self Care | Payer: Medicare Other | Attending: Family Medicine | Admitting: Family Medicine

## 2016-02-05 ENCOUNTER — Encounter (HOSPITAL_COMMUNITY): Payer: Self-pay | Admitting: Emergency Medicine

## 2016-02-05 DIAGNOSIS — I5022 Chronic systolic (congestive) heart failure: Secondary | ICD-10-CM | POA: Insufficient documentation

## 2016-02-05 DIAGNOSIS — I120 Hypertensive chronic kidney disease with stage 5 chronic kidney disease or end stage renal disease: Secondary | ICD-10-CM | POA: Diagnosis not present

## 2016-02-05 DIAGNOSIS — N186 End stage renal disease: Secondary | ICD-10-CM | POA: Diagnosis present

## 2016-02-05 DIAGNOSIS — I132 Hypertensive heart and chronic kidney disease with heart failure and with stage 5 chronic kidney disease, or end stage renal disease: Principal | ICD-10-CM | POA: Insufficient documentation

## 2016-02-05 DIAGNOSIS — Z79899 Other long term (current) drug therapy: Secondary | ICD-10-CM | POA: Diagnosis not present

## 2016-02-05 DIAGNOSIS — Z992 Dependence on renal dialysis: Secondary | ICD-10-CM | POA: Diagnosis not present

## 2016-02-05 DIAGNOSIS — F17210 Nicotine dependence, cigarettes, uncomplicated: Secondary | ICD-10-CM | POA: Diagnosis not present

## 2016-02-05 DIAGNOSIS — Z7982 Long term (current) use of aspirin: Secondary | ICD-10-CM | POA: Diagnosis not present

## 2016-02-05 MED ORDER — ACETAMINOPHEN 325 MG PO TABS: 650.0000 mg | ORAL_TABLET | Freq: Four times a day (QID) | ORAL | Status: DC | PRN

## 2016-02-05 MED ORDER — DIPHENHYDRAMINE HCL 25 MG PO CAPS
25.0000 mg | ORAL_CAPSULE | Freq: Four times a day (QID) | ORAL | Status: DC | PRN
  Administered 2016-02-05: 25 mg via ORAL
  Filled 2016-02-05: qty 1

## 2016-02-05 MED ORDER — PREDNISONE 20 MG PO TABS: 20.0000 mg | ORAL_TABLET | Freq: Every day | ORAL | Status: DC

## 2016-02-05 MED ORDER — METOPROLOL TARTRATE 50 MG PO TABS
50.0000 mg | ORAL_TABLET | Freq: Two times a day (BID) | ORAL | Status: DC
Start: 2016-02-05 — End: 2016-02-06

## 2016-02-05 MED ORDER — FUROSEMIDE 80 MG PO TABS: 120.0000 mg | ORAL_TABLET | Freq: Every day | ORAL | Status: DC

## 2016-02-05 MED ORDER — ASPIRIN-ACETAMINOPHEN-CAFFEINE 250-250-65 MG PO TABS: 2.0000 | ORAL_TABLET | Freq: Four times a day (QID) | ORAL | Status: DC | PRN

## 2016-02-05 MED ORDER — SEVELAMER CARBONATE 800 MG PO TABS
3200.0000 mg | ORAL_TABLET | Freq: Three times a day (TID) | ORAL | Status: DC
Start: 2016-02-05 — End: 2016-02-06

## 2016-02-05 MED ORDER — EPOETIN ALFA 20000 UNIT/ML IJ SOLN: 14000.0000 [IU] | INTRAMUSCULAR | Status: DC

## 2016-02-05 MED ORDER — FOLIC ACID 1 MG PO TABS: 1.0000 mg | ORAL_TABLET | Freq: Every day | ORAL | Status: DC

## 2016-02-05 MED ORDER — TRAMADOL HCL 50 MG PO TABS
50.0000 mg | ORAL_TABLET | Freq: Four times a day (QID) | ORAL | Status: DC | PRN
  Administered 2016-02-05: 50 mg via ORAL
  Filled 2016-02-05: qty 1

## 2016-02-05 NOTE — Discharge Planning (Signed)
Patient signed out AMA
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
## 2016-02-05 NOTE — ED Triage Notes (Signed)
Patient states she needs dialysis.

## 2016-02-08 ENCOUNTER — Emergency Department (HOSPITAL_COMMUNITY)
Admission: EM | Admit: 2016-02-08 | Discharge: 2016-02-08 | Disposition: A | Discharge status: Home / Self Care | Payer: Medicare Other | Attending: Emergency Medicine | Admitting: Emergency Medicine

## 2016-02-08 ENCOUNTER — Encounter (HOSPITAL_COMMUNITY): Payer: Self-pay | Admitting: Emergency Medicine

## 2016-02-08 DIAGNOSIS — Z7982 Long term (current) use of aspirin: Secondary | ICD-10-CM | POA: Diagnosis not present

## 2016-02-08 DIAGNOSIS — N186 End stage renal disease: Secondary | ICD-10-CM | POA: Diagnosis not present

## 2016-02-08 DIAGNOSIS — I132 Hypertensive heart and chronic kidney disease with heart failure and with stage 5 chronic kidney disease, or end stage renal disease: Secondary | ICD-10-CM | POA: Diagnosis not present

## 2016-02-08 DIAGNOSIS — F17210 Nicotine dependence, cigarettes, uncomplicated: Secondary | ICD-10-CM | POA: Insufficient documentation

## 2016-02-08 DIAGNOSIS — I509 Heart failure, unspecified: Secondary | ICD-10-CM | POA: Diagnosis not present

## 2016-02-08 DIAGNOSIS — Z79899 Other long term (current) drug therapy: Secondary | ICD-10-CM | POA: Insufficient documentation

## 2016-02-08 DIAGNOSIS — Z992 Dependence on renal dialysis: Secondary | ICD-10-CM

## 2016-02-08 DIAGNOSIS — Z4931 Encounter for adequacy testing for hemodialysis: Secondary | ICD-10-CM | POA: Insufficient documentation

## 2016-02-08 NOTE — ED Notes (Signed)
Pt made aware to return if symptoms worsen or if any life threatening symptoms occur.   

## 2016-02-08 NOTE — ED Notes (Addendum)
Pt wanting to sign out stating that she has something to do and will come back later. PT guided back to room so MD could speak with her.  Dr. Lita Mains at bedside speaking to pt at this time, pt is planning on coming to ED tomorrow to get dialysis.
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
## 2016-02-08 NOTE — ED Triage Notes (Signed)
Pt here for dialysis, last treatment on Tuesday of this week.  Pt has no complaints today.

## 2016-02-09 ENCOUNTER — Observation Stay (HOSPITAL_COMMUNITY)
Admission: EM | Admit: 2016-02-09 | Discharge: 2016-02-09 | Disposition: A | Discharge status: Home / Self Care | Payer: Medicare Other | Attending: Internal Medicine | Admitting: Internal Medicine

## 2016-02-09 ENCOUNTER — Encounter (HOSPITAL_COMMUNITY): Payer: Self-pay | Admitting: Emergency Medicine

## 2016-02-09 DIAGNOSIS — Z79899 Other long term (current) drug therapy: Secondary | ICD-10-CM | POA: Diagnosis not present

## 2016-02-09 DIAGNOSIS — N186 End stage renal disease: Secondary | ICD-10-CM

## 2016-02-09 DIAGNOSIS — I5022 Chronic systolic (congestive) heart failure: Secondary | ICD-10-CM | POA: Insufficient documentation

## 2016-02-09 DIAGNOSIS — F17210 Nicotine dependence, cigarettes, uncomplicated: Secondary | ICD-10-CM | POA: Insufficient documentation

## 2016-02-09 DIAGNOSIS — Z7952 Long term (current) use of systemic steroids: Secondary | ICD-10-CM | POA: Insufficient documentation

## 2016-02-09 DIAGNOSIS — Z7982 Long term (current) use of aspirin: Secondary | ICD-10-CM | POA: Diagnosis not present

## 2016-02-09 DIAGNOSIS — I120 Hypertensive chronic kidney disease with stage 5 chronic kidney disease or end stage renal disease: Secondary | ICD-10-CM | POA: Diagnosis not present

## 2016-02-09 DIAGNOSIS — I10 Essential (primary) hypertension: Secondary | ICD-10-CM | POA: Diagnosis not present

## 2016-02-09 DIAGNOSIS — N19 Unspecified kidney failure: Secondary | ICD-10-CM | POA: Diagnosis present

## 2016-02-09 DIAGNOSIS — M3214 Glomerular disease in systemic lupus erythematosus: Secondary | ICD-10-CM | POA: Insufficient documentation

## 2016-02-09 DIAGNOSIS — Z992 Dependence on renal dialysis: Secondary | ICD-10-CM

## 2016-02-09 DIAGNOSIS — F250 Schizoaffective disorder, bipolar type: Secondary | ICD-10-CM | POA: Insufficient documentation

## 2016-02-09 DIAGNOSIS — I132 Hypertensive heart and chronic kidney disease with heart failure and with stage 5 chronic kidney disease, or end stage renal disease: Secondary | ICD-10-CM | POA: Diagnosis not present

## 2016-02-09 DIAGNOSIS — Z9119 Patient's noncompliance with other medical treatment and regimen: Secondary | ICD-10-CM | POA: Diagnosis not present

## 2016-02-09 DIAGNOSIS — D631 Anemia in chronic kidney disease: Secondary | ICD-10-CM | POA: Insufficient documentation

## 2016-02-09 DIAGNOSIS — R0602 Shortness of breath: Secondary | ICD-10-CM | POA: Diagnosis not present

## 2016-02-09 LAB — RENAL FUNCTION PANEL
Albumin: 3.1 g/dL — ABNORMAL LOW (ref 3.5–5.0)
Anion gap: 16 — ABNORMAL HIGH (ref 5–15)
BUN: 79 mg/dL — AB (ref 6–20)
CHLORIDE: 97 mmol/L — AB (ref 101–111)
CO2: 22 mmol/L (ref 22–32)
CREATININE: 14.73 mg/dL — AB (ref 0.44–1.00)
Calcium: 9.2 mg/dL (ref 8.9–10.3)
GFR calc Af Amer: 3 mL/min — ABNORMAL LOW (ref >60)
GFR, EST NON AFRICAN AMERICAN: 3 mL/min — AB (ref >60)
GLUCOSE: 75 mg/dL (ref 65–99)
Phosphorus: 10.7 mg/dL — ABNORMAL HIGH (ref 2.5–4.6)
Potassium: 5.9 mmol/L — ABNORMAL HIGH (ref 3.5–5.1)
Sodium: 135 mmol/L (ref 135–145)

## 2016-02-09 LAB — CBC
HCT: 23.2 % — ABNORMAL LOW (ref 36.0–46.0)
Hemoglobin: 7.5 g/dL — ABNORMAL LOW (ref 12.0–15.0)
MCH: 26.1 pg (ref 26.0–34.0)
MCHC: 32.3 g/dL (ref 30.0–36.0)
MCV: 80.8 fL (ref 78.0–100.0)
PLATELETS: 212 10*3/uL (ref 150–400)
RBC: 2.87 MIL/uL — ABNORMAL LOW (ref 3.87–5.11)
RDW: 18.7 % — AB (ref 11.5–15.5)
WBC: 7.1 10*3/uL (ref 4.0–10.5)

## 2016-02-09 MED ORDER — SEVELAMER CARBONATE 800 MG PO TABS
ORAL_TABLET | ORAL | Status: AC
  Administered 2016-02-09: 3200 mg via ORAL
  Filled 2016-02-09: qty 4

## 2016-02-09 MED ORDER — DIPHENHYDRAMINE HCL 25 MG PO CAPS
25.0000 mg | ORAL_CAPSULE | Freq: Four times a day (QID) | ORAL | Status: DC | PRN
  Administered 2016-02-09: 25 mg via ORAL

## 2016-02-09 MED ORDER — SEVELAMER CARBONATE 800 MG PO TABS
3200.0000 mg | ORAL_TABLET | Freq: Three times a day (TID) | ORAL | Status: DC
  Administered 2016-02-09: 3200 mg via ORAL
  Filled 2016-02-09 (×7): qty 4

## 2016-02-09 MED ORDER — SODIUM CHLORIDE 0.9 % IV SOLN: 100.0000 mL | INTRAVENOUS | Status: DC | PRN

## 2016-02-09 MED ORDER — EPOETIN ALFA 20000 UNIT/ML IJ SOLN
14000.0000 [IU] | INTRAMUSCULAR | Status: DC
  Administered 2016-02-09: 14000 [IU] via INTRAVENOUS
  Filled 2016-02-09: qty 1

## 2016-02-09 MED ORDER — HEPARIN SODIUM (PORCINE) 1000 UNIT/ML DIALYSIS
20.0000 [IU]/kg | INTRAMUSCULAR | Status: DC | PRN
  Administered 2016-02-09: 1400 [IU] via INTRAVENOUS_CENTRAL
  Filled 2016-02-09 (×2): qty 2

## 2016-02-09 MED ORDER — HEPARIN SODIUM (PORCINE) 1000 UNIT/ML DIALYSIS
1000.0000 [IU] | INTRAMUSCULAR | Status: DC | PRN
  Filled 2016-02-09: qty 1

## 2016-02-09 MED ORDER — TRAMADOL HCL 50 MG PO TABS
ORAL_TABLET | ORAL | Status: AC
  Administered 2016-02-09: 50 mg
  Filled 2016-02-09: qty 1

## 2016-02-09 MED ORDER — PENTAFLUOROPROP-TETRAFLUOROETH EX AERO: 1.0000 "application " | INHALATION_SPRAY | CUTANEOUS | Status: DC | PRN

## 2016-02-09 MED ORDER — TRAMADOL HCL 50 MG PO TABS: 50.0000 mg | ORAL_TABLET | Freq: Two times a day (BID) | ORAL | Status: DC | PRN

## 2016-02-09 MED ORDER — DIPHENHYDRAMINE HCL 25 MG PO CAPS
ORAL_CAPSULE | ORAL | Status: AC
  Administered 2016-02-09: 25 mg via ORAL
  Filled 2016-02-09: qty 1

## 2016-02-09 MED ORDER — HEPARIN SODIUM (PORCINE) 1000 UNIT/ML IJ SOLN
INTRAMUSCULAR | Status: AC
  Administered 2016-02-09: 1400 [IU] via INTRAVENOUS_CENTRAL
  Filled 2016-02-09: qty 7

## 2016-02-09 MED ORDER — LIDOCAINE-PRILOCAINE 2.5-2.5 % EX CREA: 1.0000 "application " | TOPICAL_CREAM | CUTANEOUS | Status: DC | PRN

## 2016-02-09 MED ORDER — ALTEPLASE 2 MG IJ SOLR
2.0000 mg | Freq: Once | INTRAMUSCULAR | Status: DC | PRN
  Filled 2016-02-09: qty 2

## 2016-02-09 MED ORDER — LIDOCAINE HCL (PF) 1 % IJ SOLN: 5.0000 mL | INTRAMUSCULAR | Status: DC | PRN

## 2016-02-09 MED ORDER — EPOETIN ALFA 20000 UNIT/ML IJ SOLN
INTRAMUSCULAR | Status: AC
  Administered 2016-02-09: 14000 [IU] via INTRAVENOUS
  Filled 2016-02-09: qty 1
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
## 2016-02-09 NOTE — Discharge Summary (Signed)
Physician Discharge Summary  Sara [name] [id number] DOB: April 13, 1980 DOA: 02/09/2016  PCP: Pcp Not In System  Admit date: 02/09/2016 Discharge date: 02/09/2016  Admitted From: Home Disposition: Home  Recommendations for Outpatient Follow-up:  Will return for HD on schedule or for any acute needs.  Home Health: No Equipment/Devices: None  Discharge Condition: Stable CODE STATUS: Full Diet recommendation: Heart Healthy  Brief/Interim Summary: Sara [name] is a 35 y.o. female with medical history significant of Lupus, and ESRD on HD presents to the ED for hemodialysis. Patient cannot get hemodialysis as an outpatient so she came in today for her hemodialysis needs. While in the ED, she was noted to be afebrile, hypertensive, and tachycardic. Nephrology was consulted for HD.  Discharge Diagnoses:  Active Problems:   ESRD (end stage renal disease) on dialysis [facility])   Renal failure  ESRD on HD - Patient presents today for hemodialysis because she cannot have dialysis as an outpatient. - Consult nephrology - She will be discharged later today after dialysis  HTN - Anticipate improvement with dialysis. - Continue metoprolol  Systemic Lupus - Continue outpatient medication, prednisone    Consultants:  Nephrology: Fran Lowes, MD  Discharge Instructions  Discharge Instructions    Diet - low sodium heart healthy    Complete by:  As directed    Increase activity slowly    Complete by:  As directed        Medication List    TAKE these medications   acetaminophen 500 MG tablet Commonly known as:  TYLENOL Take 1,000 mg by mouth every 6 (six) hours as needed for moderate pain. Reported on 06/12/2015   aspirin-acetaminophen-caffeine 250-250-65 MG tablet Commonly known as:  EXCEDRIN MIGRAINE Take 2 tablets by mouth every 6 (six) hours as needed for headache.   furosemide 40 MG tablet Commonly known as:  LASIX 1 tablet 4 (four) times daily.    metoprolol 50 MG tablet Commonly known as:  LOPRESSOR Take 1 tablet (50 mg total) by mouth 2 (two) times daily.   predniSONE 20 MG tablet Commonly known as:  DELTASONE Take 1 tablet (20 mg total) by mouth daily with breakfast.   risperiDONE microspheres 25 MG injection Commonly known as:  RISPERDAL CONSTA Inject 25 mg into the muscle every 14 (fourteen) days.   sevelamer carbonate 800 MG tablet Commonly known as:  RENVELA Take 4 tablets (3,200 mg total) by mouth 3 (three) times daily with meals.   traMADol 50 MG tablet Commonly known as:  ULTRAM Take 1 tablet (50 mg total) by mouth every 6 (six) hours as needed for moderate pain.       Allergies  Allergen Reactions  . Ativan [Lorazepam] Swelling and Other (See Comments)    Dysarthria(patient has difficulty speaking and slurred speech); denies swelling, itching, pain, or numbness.  [name] [Ziprasidone Hcl] Itching and Swelling    Tongue swelling  . Keflex [Cephalexin] Swelling and Other (See Comments)    Tongue swelling. Can't talk   . Haldol [Haloperidol Lactate] Swelling    Tongue swelling. 05/31/15 - MD ok with giving as pt has tolerated in the past Pt can take benadryl.  . Other Itching    wool      Procedures/Studies: No results found. (Echo, Carotid, EGD, Colonoscopy, ERCP)    Subjective:   Discharge Exam: Vitals:   02/09/16 1600 02/09/16 1630  BP: 159/97 161/92  Pulse: 117 115  Resp:    Temp:     Vitals:   02/09/16  1500 02/09/16 1530 02/09/16 1600 02/09/16 1630  BP: 156/86 153/96 159/97 161/92  Pulse: (!) 121 115 117 115  Resp:      Temp:      TempSrc:      SpO2:      Weight:      Height:        General: Pt is alert, awake, not in acute distress Cardiovascular: RRR, S1/S2 +, no rubs, no gallops Respiratory: CTA bilaterally, no wheezing, no rhonchi Abdominal: Soft, NT, ND, bowel sounds + Extremities: no edema, no cyanosis    The results of significant diagnostics from this  hospitalization (including imaging, microbiology, ancillary and laboratory) are listed below for reference.     Microbiology: No results found for this or any previous visit (from the past 240 hour(s)).   Labs: BNP (last 3 results) No results for input(s): BNP in the last 8760 hours. Basic Metabolic Panel:  Recent Labs Lab 02/09/16 1305  NA 135  K 5.9*  CL 97*  CO2 22  GLUCOSE 75  BUN 79*  CREATININE 14.73*  CALCIUM 9.2  PHOS 10.7*   Liver Function Tests:  Recent Labs Lab 02/09/16 1305  ALBUMIN 3.1*   No results for input(s): LIPASE, AMYLASE in the last 168 hours. No results for input(s): AMMONIA in the last 168 hours. CBC:  Recent Labs Lab 02/09/16 1305  WBC 7.1  HGB 7.5*  HCT 23.2*  MCV 80.8  PLT 212   Cardiac Enzymes: No results for input(s): CKTOTAL, CKMB, CKMBINDEX, TROPONINI in the last 168 hours. BNP: Invalid input(s): POCBNP CBG: No results for input(s): GLUCAP in the last 168 hours. D-Dimer No results for input(s): DDIMER in the last 72 hours. Hgb A1c No results for input(s): HGBA1C in the last 72 hours. Lipid Profile No results for input(s): CHOL, HDL, LDLCALC, TRIG, CHOLHDL, LDLDIRECT in the last 72 hours. Thyroid function studies No results for input(s): TSH, T4TOTAL, T3FREE, THYROIDAB in the last 72 hours.  Invalid input(s): FREET3 Anemia work up No results for input(s): VITAMINB12, FOLATE, FERRITIN, TIBC, IRON, RETICCTPCT in the last 72 hours. Urinalysis    Component Value Date/Time   COLORURINE YELLOW 08/11/2015 1235   APPEARANCEUR HAZY (A) 08/11/2015 1235   LABSPEC 1.015 08/11/2015 1235   PHURINE 7.5 08/11/2015 1235   GLUCOSEU 100 (A) 08/11/2015 1235   HGBUR SMALL (A) 08/11/2015 1235   BILIRUBINUR NEGATIVE 08/11/2015 1235   KETONESUR NEGATIVE 08/11/2015 1235   PROTEINUR >300 (A) 08/11/2015 1235   UROBILINOGEN 0.2 01/09/2015 1645   NITRITE NEGATIVE 08/11/2015 1235   LEUKOCYTESUR SMALL (A) 08/11/2015 1235   Sepsis  Labs Invalid input(s): PROCALCITONIN,  WBC,  LACTICIDVEN Microbiology No results found for this or any previous visit (from the past 240 hour(s)).   Time coordinating discharge: Over 30 minutes  SIGNED:  Lelon Frohlich, MD [facility] 02/09/2016, 5:00 PM If 7PM-7AM, please contact night-coverage [email or web address] Password [name]  By signing my name below, I, Hilbert Odor, attest that this documentation has been prepared under the direction and in the presence of Estela Leonie Green, MD. Electronically signed: Hilbert Odor, Scribe.  02/09/16

## 2016-02-09 NOTE — ED Provider Notes (Signed)
Presents for hemodialysis session. He complains of mild generalized weakness. Denies shortness of breath. Her last hemodialysis session was 4 days ago. She completed the session. Patient is alert awake appears in no distress. Lungs clear to auscultation   Orlie Dakin, MD 02/09/16 1026
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
## 2016-02-09 NOTE — Procedures (Signed)
   HEMODIALYSIS TREATMENT NOTE:  4.5 hour low-heparin dialysis completed via right upper arm AVF (15g ante/retrograde). Goal met:  4.1 liters removed without interruption in ultrafiltration.  All blood was returned and hemostasis was achieved within 15 minutes. Pt stated she intended to leave hospital from HD unit after session completed.  She signed [place] waiver and left ambulatory with steady gait, 138/92 standing.  Rockwell Alexandria, RN, CDN

## 2016-02-09 NOTE — ED Triage Notes (Signed)
Patient gets dialysis Monday's, Wednesdays, and Fridays. Per patient came for dialysis yesterday and had blood drawn. Per patient was told that her "potassium was not high so she did not need dialysis yesterda." Per patient was told to return today for dialysis. Denies any symptoms.

## 2016-02-09 NOTE — ED Notes (Signed)
AC called and said to discharge pt out as AMA since she never went to her assigned room.

## 2016-02-11 ENCOUNTER — Observation Stay (HOSPITAL_COMMUNITY)
Admission: EM | Admit: 2016-02-11 | Discharge: 2016-02-12 | Disposition: A | Discharge status: Home / Self Care | Payer: Medicare Other | Attending: Internal Medicine | Admitting: Internal Medicine

## 2016-02-11 ENCOUNTER — Encounter (HOSPITAL_COMMUNITY): Payer: Self-pay | Admitting: *Deleted

## 2016-02-11 DIAGNOSIS — F250 Schizoaffective disorder, bipolar type: Secondary | ICD-10-CM | POA: Diagnosis not present

## 2016-02-11 DIAGNOSIS — N186 End stage renal disease: Secondary | ICD-10-CM | POA: Diagnosis not present

## 2016-02-11 DIAGNOSIS — F17210 Nicotine dependence, cigarettes, uncomplicated: Secondary | ICD-10-CM | POA: Diagnosis not present

## 2016-02-11 DIAGNOSIS — M329 Systemic lupus erythematosus, unspecified: Secondary | ICD-10-CM | POA: Diagnosis present

## 2016-02-11 DIAGNOSIS — Z9119 Patient's noncompliance with other medical treatment and regimen: Secondary | ICD-10-CM | POA: Insufficient documentation

## 2016-02-11 DIAGNOSIS — I5022 Chronic systolic (congestive) heart failure: Secondary | ICD-10-CM | POA: Diagnosis not present

## 2016-02-11 DIAGNOSIS — R946 Abnormal results of thyroid function studies: Secondary | ICD-10-CM | POA: Diagnosis present

## 2016-02-11 DIAGNOSIS — I120 Hypertensive chronic kidney disease with stage 5 chronic kidney disease or end stage renal disease: Secondary | ICD-10-CM | POA: Diagnosis not present

## 2016-02-11 DIAGNOSIS — D631 Anemia in chronic kidney disease: Secondary | ICD-10-CM | POA: Diagnosis not present

## 2016-02-11 DIAGNOSIS — I132 Hypertensive heart and chronic kidney disease with heart failure and with stage 5 chronic kidney disease, or end stage renal disease: Secondary | ICD-10-CM | POA: Diagnosis not present

## 2016-02-11 DIAGNOSIS — I10 Essential (primary) hypertension: Secondary | ICD-10-CM | POA: Diagnosis present

## 2016-02-11 DIAGNOSIS — Z992 Dependence on renal dialysis: Secondary | ICD-10-CM | POA: Insufficient documentation

## 2016-02-11 DIAGNOSIS — M3214 Glomerular disease in systemic lupus erythematosus: Secondary | ICD-10-CM | POA: Diagnosis not present

## 2016-02-11 DIAGNOSIS — Z79899 Other long term (current) drug therapy: Secondary | ICD-10-CM | POA: Insufficient documentation

## 2016-02-11 DIAGNOSIS — Z7982 Long term (current) use of aspirin: Secondary | ICD-10-CM | POA: Diagnosis not present

## 2016-02-11 DIAGNOSIS — Z7952 Long term (current) use of systemic steroids: Secondary | ICD-10-CM | POA: Diagnosis not present

## 2016-02-11 DIAGNOSIS — N189 Chronic kidney disease, unspecified: Secondary | ICD-10-CM

## 2016-02-11 LAB — CBC WITH DIFFERENTIAL/PLATELET
Basophils Absolute: 0 10*3/uL (ref 0.0–0.1)
Basophils Relative: 0 %
EOS ABS: 0 10*3/uL (ref 0.0–0.7)
EOS PCT: 0 %
HCT: 24.6 % — ABNORMAL LOW (ref 36.0–46.0)
Hemoglobin: 7.9 g/dL — ABNORMAL LOW (ref 12.0–15.0)
LYMPHS ABS: 1.1 10*3/uL (ref 0.7–4.0)
Lymphocytes Relative: 20 %
MCH: 26.2 pg (ref 26.0–34.0)
MCHC: 32.1 g/dL (ref 30.0–36.0)
MCV: 81.7 fL (ref 78.0–100.0)
MONOS PCT: 13 %
Monocytes Absolute: 0.7 10*3/uL (ref 0.1–1.0)
Neutro Abs: 3.6 10*3/uL (ref 1.7–7.7)
Neutrophils Relative %: 66 %
PLATELETS: 196 10*3/uL (ref 150–400)
RBC: 3.01 MIL/uL — AB (ref 3.87–5.11)
RDW: 18.7 % — ABNORMAL HIGH (ref 11.5–15.5)
WBC: 5.5 10*3/uL (ref 4.0–10.5)

## 2016-02-11 LAB — BASIC METABOLIC PANEL
Anion gap: 13 (ref 5–15)
BUN: 51 mg/dL — AB (ref 6–20)
CO2: 25 mmol/L (ref 22–32)
Calcium: 9.4 mg/dL (ref 8.9–10.3)
Chloride: 97 mmol/L — ABNORMAL LOW (ref 101–111)
Creatinine, Ser: 10.65 mg/dL — ABNORMAL HIGH (ref 0.44–1.00)
GFR calc Af Amer: 5 mL/min — ABNORMAL LOW (ref >60)
GFR, EST NON AFRICAN AMERICAN: 4 mL/min — AB (ref >60)
GLUCOSE: 100 mg/dL — AB (ref 65–99)
POTASSIUM: 5 mmol/L (ref 3.5–5.1)
Sodium: 135 mmol/L (ref 135–145)

## 2016-02-11 MED ORDER — TRAMADOL HCL 50 MG PO TABS
ORAL_TABLET | ORAL | Status: AC
  Administered 2016-02-11: 50 mg via ORAL
  Filled 2016-02-11: qty 1

## 2016-02-11 MED ORDER — HEPARIN SODIUM (PORCINE) 1000 UNIT/ML DIALYSIS
20.0000 [IU]/kg | INTRAMUSCULAR | Status: DC | PRN
  Administered 2016-02-11: 1400 [IU] via INTRAVENOUS_CENTRAL
  Filled 2016-02-11 (×2): qty 2

## 2016-02-11 MED ORDER — SEVELAMER CARBONATE 800 MG PO TABS
3200.0000 mg | ORAL_TABLET | Freq: Three times a day (TID) | ORAL | Status: DC
  Administered 2016-02-11: 3200 mg via ORAL
  Filled 2016-02-11 (×7): qty 4

## 2016-02-11 MED ORDER — SEVELAMER CARBONATE 800 MG PO TABS
ORAL_TABLET | ORAL | Status: AC
  Administered 2016-02-11: 3200 mg via ORAL
  Filled 2016-02-11: qty 4

## 2016-02-11 MED ORDER — DIPHENHYDRAMINE HCL 25 MG PO CAPS
ORAL_CAPSULE | ORAL | Status: AC
  Administered 2016-02-11: 25 mg via ORAL
  Filled 2016-02-11: qty 1

## 2016-02-11 MED ORDER — TRAMADOL HCL 50 MG PO TABS
50.0000 mg | ORAL_TABLET | Freq: Two times a day (BID) | ORAL | Status: DC | PRN
  Administered 2016-02-11: 50 mg via ORAL

## 2016-02-11 MED ORDER — DIPHENHYDRAMINE HCL 25 MG PO CAPS
25.0000 mg | ORAL_CAPSULE | Freq: Four times a day (QID) | ORAL | Status: DC | PRN
  Administered 2016-02-11: 25 mg via ORAL

## 2016-02-11 MED ORDER — SODIUM CHLORIDE 0.9 % IV SOLN: 100.0000 mL | INTRAVENOUS | Status: DC | PRN

## 2016-02-11 MED ORDER — HEPARIN SODIUM (PORCINE) 1000 UNIT/ML IJ SOLN
INTRAMUSCULAR | Status: AC
  Administered 2016-02-11: 1400 [IU] via INTRAVENOUS_CENTRAL
  Filled 2016-02-11: qty 6

## 2016-02-11 MED ORDER — LIDOCAINE HCL (PF) 1 % IJ SOLN: 5.0000 mL | INTRAMUSCULAR | Status: DC | PRN

## 2016-02-11 MED ORDER — EPOETIN ALFA 20000 UNIT/ML IJ SOLN
14000.0000 [IU] | INTRAMUSCULAR | Status: DC
  Administered 2016-02-11: 14000 [IU] via SUBCUTANEOUS
  Filled 2016-02-11 (×2): qty 1

## 2016-02-11 MED ORDER — EPOETIN ALFA 20000 UNIT/ML IJ SOLN
INTRAMUSCULAR | Status: AC
  Administered 2016-02-11: 14000 [IU] via SUBCUTANEOUS
  Filled 2016-02-11: qty 1

## 2016-02-11 MED ORDER — PENTAFLUOROPROP-TETRAFLUOROETH EX AERO: 1.0000 "application " | INHALATION_SPRAY | CUTANEOUS | Status: DC | PRN

## 2016-02-11 MED ORDER — LIDOCAINE-PRILOCAINE 2.5-2.5 % EX CREA: 1.0000 "application " | TOPICAL_CREAM | CUTANEOUS | Status: DC | PRN
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
## 2016-02-11 NOTE — ED Notes (Signed)
Pt request heat be turned on and light turned off.
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
## 2016-02-11 NOTE — ED Triage Notes (Signed)
Pt comes in for dialysis. States she forgot to take her prednisone this morning.

## 2016-02-11 NOTE — Procedures (Signed)
   HEMODIALYSIS TREATMENT NOTE:  4 hour low-heparin dialysis completed via right upper arm AVF (15g ante/retrograde). Pt requested to terminate session 30 minutes early/AMA for unstated reasons.  Goal nearly met: 3.9 liters removed without interruption in ultrafiltration.  All blood was returned and hemostasis was achieved within 10 minutes.  Pt reported she intended to leave hospital from HD unit.  She signed an [facility] waiver and left ambulatory with steady gait, 144/90-111.  ED secretary and 3A charge nurse were notified of pt's departure.  Rockwell Alexandria, RN, CDN

## 2016-02-11 NOTE — ED Notes (Signed)
Spoke to Dialysis nurse and states she will call writer back in 30 minutes to see about bringing patient up to floor.

## 2016-02-11 NOTE — Discharge Summary (Signed)
Physician Discharge Summary  Sara [name] [name] DOB: [birth year] DOA: 02/11/2016  PCP: Pcp Not In System  Admit date: 02/11/2016 Discharge date: 02/11/2016  Admitted From: home Disposition:  home  Recommendations for Outpatient Follow-up:  1. Follow up with PCP in 1-2 weeks 2. She has been referred to endocrinology for further work up of hyperthyroidism  Home Health: Equipment/Devices:  Discharge Condition:stable CODE STATUS: full Diet recommendation: Heart Healthy   Brief/Interim Summary: Sara [name] a 35 y.o.femalewho has a history of ESRD on HD, who cannot have dialysis as an outpatient, presents to ED for regular dialysis session. She denies any shortness of breath, chest pain, fever, cough or other complaints. She does not describe worsening edema. She feels she is in her usual state of health  Patient underwent dialysis without any immediate complications. She is feeling better after dialysis. She is noted to be anemic, likely due to renal disease, but this is near her baseline. She has been referred to endocrinology to address abnormal thyroid studies and further workup for possible hyperthyroidism. Patient is stable for discharge home.  Discharge Diagnoses:  Active Problems:   Essential hypertension   Systemic lupus (HCC)   Anemia in ESRD (end-stage renal disease) (HCC)   ESRD (end stage renal disease) (HCC)   Abnormal thyroid function test    Discharge Instructions  Discharge Instructions    Diet - low sodium heart healthy    Complete by:  As directed    Increase activity slowly    Complete by:  As directed        Medication List    TAKE these medications   acetaminophen 500 MG tablet Commonly known as:  TYLENOL Take 1,000 mg by mouth every 6 (six) hours as needed for moderate pain. Reported on 06/12/2015   aspirin-acetaminophen-caffeine 250-250-65 MG tablet Commonly known as:  EXCEDRIN MIGRAINE Take 2 tablets by mouth every 6 (six)  hours as needed for headache.   furosemide 40 MG tablet Commonly known as:  LASIX 1 tablet 4 (four) times daily.   metoprolol 50 MG tablet Commonly known as:  LOPRESSOR Take 1 tablet (50 mg total) by mouth 2 (two) times daily.   predniSONE 20 MG tablet Commonly known as:  DELTASONE Take 1 tablet (20 mg total) by mouth daily with breakfast.   risperiDONE microspheres 25 MG injection Commonly known as:  RISPERDAL CONSTA Inject 25 mg into the muscle every 14 (fourteen) days.   sevelamer carbonate 800 MG tablet Commonly known as:  RENVELA Take 4 tablets (3,200 mg total) by mouth 3 (three) times daily with meals.   traMADol 50 MG tablet Commonly known as:  ULTRAM Take 1 tablet (50 mg total) by mouth every 6 (six) hours as needed for moderate pain.       Allergies  Allergen Reactions  . Ativan [Lorazepam] Swelling and Other (See Comments)    Dysarthria(patient has difficulty speaking and slurred speech); denies swelling, itching, pain, or numbness.  [name] [Ziprasidone Hcl] Itching and Swelling    Tongue swelling  . Keflex [Cephalexin] Swelling and Other (See Comments)    Tongue swelling. Can't talk   . Haldol [Haloperidol Lactate] Swelling    Tongue swelling. 05/31/15 - MD ok with giving as pt has tolerated in the past Pt can take benadryl.  . Other Itching    wool    Consultations:  Nephrology     Subjective: No chest pain or shortness of breath  Discharge Exam: Vitals:   02/11/16 1500 02/11/16  1530  BP: 154/84 155/91  Pulse: 109 109  Resp:    Temp:     Vitals:   02/11/16 1400 02/11/16 1430 02/11/16 1500 02/11/16 1530  BP: (!) 155/103 (!) 143/107 154/84 155/91  Pulse: 109 (!) 121 109 109  Resp:      Temp:      TempSrc:      SpO2:      Height:        General: Pt is alert, awake, not in acute distress Cardiovascular: RRR, S1/S2 +, no rubs, no gallops Respiratory: CTA bilaterally, no wheezing, no rhonchi Abdominal: Soft, NT, ND, bowel sounds  + Extremities: no edema, no cyanosis    The results of significant diagnostics from this hospitalization (including imaging, microbiology, ancillary and laboratory) are listed below for reference.     Microbiology: No results found for this or any previous visit (from the past 240 hour(s)).   Labs: BNP (last 3 results) No results for input(s): BNP in the last 8760 hours. Basic Metabolic Panel:  Recent Labs Lab 02/09/16 1305 02/11/16 0857  NA 135 135  K 5.9* 5.0  CL 97* 97*  CO2 22 25  GLUCOSE 75 100*  BUN 79* 51*  CREATININE 14.73* 10.65*  CALCIUM 9.2 9.4  PHOS 10.7*  --    Liver Function Tests:  Recent Labs Lab 02/09/16 1305  ALBUMIN 3.1*   No results for input(s): LIPASE, AMYLASE in the last 168 hours. No results for input(s): AMMONIA in the last 168 hours. CBC:  Recent Labs Lab 02/09/16 1305 02/11/16 0857  WBC 7.1 5.5  NEUTROABS  --  3.6  HGB 7.5* 7.9*  HCT 23.2* 24.6*  MCV 80.8 81.7  PLT 212 196   Cardiac Enzymes: No results for input(s): CKTOTAL, CKMB, CKMBINDEX, TROPONINI in the last 168 hours. BNP: Invalid input(s): POCBNP CBG: No results for input(s): GLUCAP in the last 168 hours. D-Dimer No results for input(s): DDIMER in the last 72 hours. Hgb A1c No results for input(s): HGBA1C in the last 72 hours. Lipid Profile No results for input(s): CHOL, HDL, LDLCALC, TRIG, CHOLHDL, LDLDIRECT in the last 72 hours. Thyroid function studies No results for input(s): TSH, T4TOTAL, T3FREE, THYROIDAB in the last 72 hours.  Invalid input(s): FREET3 Anemia work up No results for input(s): VITAMINB12, FOLATE, FERRITIN, TIBC, IRON, RETICCTPCT in the last 72 hours. Urinalysis    Component Value Date/Time   COLORURINE YELLOW 08/11/2015 1235   APPEARANCEUR HAZY (A) 08/11/2015 1235   LABSPEC 1.015 08/11/2015 1235   PHURINE 7.5 08/11/2015 1235   GLUCOSEU 100 (A) 08/11/2015 1235   HGBUR SMALL (A) 08/11/2015 1235   BILIRUBINUR NEGATIVE 08/11/2015 1235    KETONESUR NEGATIVE 08/11/2015 1235   PROTEINUR >300 (A) 08/11/2015 1235   UROBILINOGEN 0.2 01/09/2015 1645   NITRITE NEGATIVE 08/11/2015 1235   LEUKOCYTESUR SMALL (A) 08/11/2015 1235   Sepsis Labs Invalid input(s): PROCALCITONIN,  WBC,  LACTICIDVEN Microbiology No results found for this or any previous visit (from the past 240 hour(s)).   Time coordinating discharge: Over 30 minutes  SIGNED:   Kathie Dike, MD  [facility] 02/11/2016, 5:40 PM Pager   If 7PM-7AM, please contact night-coverage [email or web address] Password [name]

## 2016-02-13 ENCOUNTER — Encounter (HOSPITAL_COMMUNITY): Payer: Self-pay

## 2016-02-13 ENCOUNTER — Emergency Department (HOSPITAL_COMMUNITY)
Admission: EM | Admit: 2016-02-13 | Discharge: 2016-02-13 | Disposition: A | Discharge status: Home / Self Care | Payer: Medicare Other | Attending: Emergency Medicine | Admitting: Emergency Medicine

## 2016-02-13 DIAGNOSIS — Z7982 Long term (current) use of aspirin: Secondary | ICD-10-CM | POA: Diagnosis not present

## 2016-02-13 DIAGNOSIS — I120 Hypertensive chronic kidney disease with stage 5 chronic kidney disease or end stage renal disease: Secondary | ICD-10-CM | POA: Diagnosis not present

## 2016-02-13 DIAGNOSIS — N186 End stage renal disease: Secondary | ICD-10-CM | POA: Diagnosis not present

## 2016-02-13 DIAGNOSIS — I132 Hypertensive heart and chronic kidney disease with heart failure and with stage 5 chronic kidney disease, or end stage renal disease: Secondary | ICD-10-CM | POA: Insufficient documentation

## 2016-02-13 DIAGNOSIS — Z992 Dependence on renal dialysis: Secondary | ICD-10-CM | POA: Insufficient documentation

## 2016-02-13 DIAGNOSIS — Z79899 Other long term (current) drug therapy: Secondary | ICD-10-CM | POA: Insufficient documentation

## 2016-02-13 DIAGNOSIS — F17210 Nicotine dependence, cigarettes, uncomplicated: Secondary | ICD-10-CM | POA: Insufficient documentation

## 2016-02-13 DIAGNOSIS — I5022 Chronic systolic (congestive) heart failure: Secondary | ICD-10-CM | POA: Diagnosis not present

## 2016-02-13 LAB — CBC WITH DIFFERENTIAL/PLATELET
BASOS PCT: 1 %
Basophils Absolute: 0.1 10*3/uL (ref 0.0–0.1)
Eosinophils Absolute: 0.2 10*3/uL (ref 0.0–0.7)
Eosinophils Relative: 3 %
HEMATOCRIT: 27 % — AB (ref 36.0–46.0)
HEMOGLOBIN: 8.3 g/dL — AB (ref 12.0–15.0)
LYMPHS ABS: 0.9 10*3/uL (ref 0.7–4.0)
Lymphocytes Relative: 16 %
MCH: 25.6 pg — AB (ref 26.0–34.0)
MCHC: 30.7 g/dL (ref 30.0–36.0)
MCV: 83.3 fL (ref 78.0–100.0)
MONO ABS: 0.8 10*3/uL (ref 0.1–1.0)
Monocytes Relative: 13 %
NEUTROS ABS: 3.9 10*3/uL (ref 1.7–7.7)
Neutrophils Relative %: 67 %
Platelets: 242 10*3/uL (ref 150–400)
RBC: 3.24 MIL/uL — ABNORMAL LOW (ref 3.87–5.11)
RDW: 19.3 % — AB (ref 11.5–15.5)
WBC: 5.8 10*3/uL (ref 4.0–10.5)

## 2016-02-13 LAB — BASIC METABOLIC PANEL
ANION GAP: 13 (ref 5–15)
BUN: 37 mg/dL — ABNORMAL HIGH (ref 6–20)
CALCIUM: 9.2 mg/dL (ref 8.9–10.3)
CHLORIDE: 96 mmol/L — AB (ref 101–111)
CO2: 28 mmol/L (ref 22–32)
CREATININE: 9.31 mg/dL — AB (ref 0.44–1.00)
GFR calc Af Amer: 6 mL/min — ABNORMAL LOW (ref >60)
GFR calc non Af Amer: 5 mL/min — ABNORMAL LOW (ref >60)
GLUCOSE: 95 mg/dL (ref 65–99)
Potassium: 4 mmol/L (ref 3.5–5.1)
Sodium: 137 mmol/L (ref 135–145)

## 2016-02-13 MED ORDER — PREDNISONE 10 MG PO TABS
10.0000 mg | ORAL_TABLET | Freq: Once | ORAL | Status: AC
  Administered 2016-02-13: 10 mg via ORAL
  Filled 2016-02-13: qty 1

## 2016-02-13 NOTE — ED Notes (Signed)
Pt seen leaving  

## 2016-02-13 NOTE — ED Triage Notes (Signed)
Pt reports had dialysis Monday and is here for another treatment today.  Pt also says she forgot her morning dose of prednisone 10mg  and is requesting a dose here because her hips hurt.
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
## 2016-02-15 ENCOUNTER — Emergency Department (HOSPITAL_COMMUNITY): Payer: Medicare Other

## 2016-02-15 ENCOUNTER — Encounter (HOSPITAL_COMMUNITY): Payer: Self-pay | Admitting: Emergency Medicine

## 2016-02-15 ENCOUNTER — Observation Stay (HOSPITAL_COMMUNITY)
Admission: EM | Admit: 2016-02-15 | Discharge: 2016-02-15 | Disposition: A | Discharge status: Home / Self Care | Payer: Medicare Other | Attending: Internal Medicine | Admitting: Internal Medicine

## 2016-02-15 DIAGNOSIS — E8770 Fluid overload, unspecified: Secondary | ICD-10-CM | POA: Diagnosis present

## 2016-02-15 DIAGNOSIS — I132 Hypertensive heart and chronic kidney disease with heart failure and with stage 5 chronic kidney disease, or end stage renal disease: Secondary | ICD-10-CM | POA: Diagnosis not present

## 2016-02-15 DIAGNOSIS — I5022 Chronic systolic (congestive) heart failure: Secondary | ICD-10-CM | POA: Insufficient documentation

## 2016-02-15 DIAGNOSIS — Z992 Dependence on renal dialysis: Secondary | ICD-10-CM | POA: Diagnosis not present

## 2016-02-15 DIAGNOSIS — M329 Systemic lupus erythematosus, unspecified: Secondary | ICD-10-CM | POA: Diagnosis present

## 2016-02-15 DIAGNOSIS — Z91199 Patient's noncompliance with other medical treatment and regimen due to unspecified reason: Secondary | ICD-10-CM

## 2016-02-15 DIAGNOSIS — I120 Hypertensive chronic kidney disease with stage 5 chronic kidney disease or end stage renal disease: Secondary | ICD-10-CM | POA: Diagnosis not present

## 2016-02-15 DIAGNOSIS — Z9119 Patient's noncompliance with other medical treatment and regimen: Secondary | ICD-10-CM | POA: Diagnosis not present

## 2016-02-15 DIAGNOSIS — F17210 Nicotine dependence, cigarettes, uncomplicated: Secondary | ICD-10-CM | POA: Diagnosis not present

## 2016-02-15 DIAGNOSIS — D631 Anemia in chronic kidney disease: Secondary | ICD-10-CM | POA: Diagnosis not present

## 2016-02-15 DIAGNOSIS — N186 End stage renal disease: Secondary | ICD-10-CM | POA: Diagnosis not present

## 2016-02-15 DIAGNOSIS — R059 Cough, unspecified: Secondary | ICD-10-CM

## 2016-02-15 DIAGNOSIS — Z7982 Long term (current) use of aspirin: Secondary | ICD-10-CM | POA: Diagnosis not present

## 2016-02-15 DIAGNOSIS — I10 Essential (primary) hypertension: Secondary | ICD-10-CM | POA: Diagnosis not present

## 2016-02-15 DIAGNOSIS — E8779 Other fluid overload: Secondary | ICD-10-CM | POA: Diagnosis not present

## 2016-02-15 DIAGNOSIS — Z79899 Other long term (current) drug therapy: Secondary | ICD-10-CM | POA: Insufficient documentation

## 2016-02-15 DIAGNOSIS — M3214 Glomerular disease in systemic lupus erythematosus: Secondary | ICD-10-CM | POA: Diagnosis not present

## 2016-02-15 DIAGNOSIS — R05 Cough: Secondary | ICD-10-CM | POA: Diagnosis not present

## 2016-02-15 DIAGNOSIS — R0602 Shortness of breath: Secondary | ICD-10-CM | POA: Diagnosis not present

## 2016-02-15 LAB — CREATININE, SERUM
CREATININE: 12.94 mg/dL — AB (ref 0.44–1.00)
GFR calc Af Amer: 4 mL/min — ABNORMAL LOW (ref >60)
GFR calc non Af Amer: 3 mL/min — ABNORMAL LOW (ref >60)

## 2016-02-15 LAB — RENAL FUNCTION PANEL
ALBUMIN: 3 g/dL — AB (ref 3.5–5.0)
ANION GAP: 14 (ref 5–15)
BUN: 59 mg/dL — ABNORMAL HIGH (ref 6–20)
CALCIUM: 8.8 mg/dL — AB (ref 8.9–10.3)
CO2: 25 mmol/L (ref 22–32)
Chloride: 98 mmol/L — ABNORMAL LOW (ref 101–111)
Creatinine, Ser: 12.74 mg/dL — ABNORMAL HIGH (ref 0.44–1.00)
GFR calc non Af Amer: 3 mL/min — ABNORMAL LOW (ref >60)
GFR, EST AFRICAN AMERICAN: 4 mL/min — AB (ref >60)
GLUCOSE: 111 mg/dL — AB (ref 65–99)
PHOSPHORUS: 8.6 mg/dL — AB (ref 2.5–4.6)
POTASSIUM: 4.2 mmol/L (ref 3.5–5.1)
SODIUM: 137 mmol/L (ref 135–145)

## 2016-02-15 LAB — IRON AND TIBC
Iron: 32 ug/dL (ref 28–170)
SATURATION RATIOS: 12 % (ref 10.4–31.8)
TIBC: 258 ug/dL (ref 250–450)
UIBC: 226 ug/dL

## 2016-02-15 LAB — CBC
HCT: 25.8 % — ABNORMAL LOW (ref 36.0–46.0)
HEMOGLOBIN: 8.2 g/dL — AB (ref 12.0–15.0)
MCH: 26.1 pg (ref 26.0–34.0)
MCHC: 31.8 g/dL (ref 30.0–36.0)
MCV: 82.2 fL (ref 78.0–100.0)
Platelets: 259 10*3/uL (ref 150–400)
RBC: 3.14 MIL/uL — ABNORMAL LOW (ref 3.87–5.11)
RDW: 19.4 % — ABNORMAL HIGH (ref 11.5–15.5)
WBC: 5.8 10*3/uL (ref 4.0–10.5)

## 2016-02-15 MED ORDER — DIPHENHYDRAMINE HCL 25 MG PO CAPS
25.0000 mg | ORAL_CAPSULE | Freq: Four times a day (QID) | ORAL | Status: DC | PRN
  Administered 2016-02-15: 25 mg via ORAL

## 2016-02-15 MED ORDER — EPOETIN ALFA 20000 UNIT/ML IJ SOLN
14000.0000 [IU] | INTRAMUSCULAR | Status: DC
  Administered 2016-02-15: 14000 [IU] via INTRAVENOUS

## 2016-02-15 MED ORDER — SEVELAMER CARBONATE 800 MG PO TABS
ORAL_TABLET | ORAL | Status: AC
  Filled 2016-02-15: qty 4

## 2016-02-15 MED ORDER — METOPROLOL TARTRATE 50 MG PO TABS
50.0000 mg | ORAL_TABLET | Freq: Two times a day (BID) | ORAL | Status: DC
  Administered 2016-02-15: 50 mg via ORAL
  Filled 2016-02-15: qty 1

## 2016-02-15 MED ORDER — SEVELAMER CARBONATE 800 MG PO TABS
3200.0000 mg | ORAL_TABLET | Freq: Three times a day (TID) | ORAL | Status: DC
  Administered 2016-02-15: 3200 mg via ORAL

## 2016-02-15 MED ORDER — PREDNISONE 20 MG PO TABS
20.0000 mg | ORAL_TABLET | Freq: Every day | ORAL | Status: DC
  Administered 2016-02-15: 20 mg via ORAL
  Filled 2016-02-15: qty 1

## 2016-02-15 MED ORDER — ACETAMINOPHEN 650 MG RE SUPP: 650.0000 mg | Freq: Four times a day (QID) | RECTAL | Status: DC | PRN

## 2016-02-15 MED ORDER — LIDOCAINE HCL (PF) 1 % IJ SOLN: 5.0000 mL | INTRAMUSCULAR | Status: DC | PRN

## 2016-02-15 MED ORDER — SODIUM CHLORIDE 0.9 % IV SOLN: 100.0000 mL | INTRAVENOUS | Status: DC | PRN

## 2016-02-15 MED ORDER — TRAMADOL HCL 50 MG PO TABS
50.0000 mg | ORAL_TABLET | Freq: Two times a day (BID) | ORAL | Status: DC | PRN
  Administered 2016-02-15: 50 mg via ORAL

## 2016-02-15 MED ORDER — HEPARIN SODIUM (PORCINE) 5000 UNIT/ML IJ SOLN: 5000.0000 [IU] | Freq: Three times a day (TID) | INTRAMUSCULAR | Status: DC

## 2016-02-15 MED ORDER — EPOETIN ALFA 20000 UNIT/ML IJ SOLN
INTRAMUSCULAR | Status: AC
  Administered 2016-02-15: 14000 [IU] via INTRAVENOUS
  Filled 2016-02-15: qty 1

## 2016-02-15 MED ORDER — HEPARIN SODIUM (PORCINE) 1000 UNIT/ML IJ SOLN
INTRAMUSCULAR | Status: AC
  Administered 2016-02-15: 1400 [IU] via INTRAVENOUS_CENTRAL
  Filled 2016-02-15: qty 5

## 2016-02-15 MED ORDER — ASPIRIN-ACETAMINOPHEN-CAFFEINE 250-250-65 MG PO TABS: 2.0000 | ORAL_TABLET | Freq: Four times a day (QID) | ORAL | Status: DC | PRN

## 2016-02-15 MED ORDER — LIDOCAINE-PRILOCAINE 2.5-2.5 % EX CREA: 1.0000 "application " | TOPICAL_CREAM | CUTANEOUS | Status: DC | PRN

## 2016-02-15 MED ORDER — RISPERIDONE MICROSPHERES 25 MG IM SUSR: 25.0000 mg | INTRAMUSCULAR | Status: DC

## 2016-02-15 MED ORDER — FUROSEMIDE 40 MG PO TABS
40.0000 mg | ORAL_TABLET | Freq: Four times a day (QID) | ORAL | Status: DC
  Administered 2016-02-15: 40 mg via ORAL
  Filled 2016-02-15: qty 1

## 2016-02-15 MED ORDER — DIPHENHYDRAMINE HCL 25 MG PO CAPS
ORAL_CAPSULE | ORAL | Status: AC
Start: 2016-02-15 — End: 2016-02-15
  Administered 2016-02-15: 25 mg via ORAL
  Filled 2016-02-15: qty 1

## 2016-02-15 MED ORDER — HEPARIN SODIUM (PORCINE) 1000 UNIT/ML DIALYSIS
20.0000 [IU]/kg | INTRAMUSCULAR | Status: DC | PRN
  Administered 2016-02-15: 1400 [IU] via INTRAVENOUS_CENTRAL

## 2016-02-15 MED ORDER — ACETAMINOPHEN 325 MG PO TABS: 650.0000 mg | ORAL_TABLET | Freq: Four times a day (QID) | ORAL | Status: DC | PRN

## 2016-02-15 MED ORDER — PENTAFLUOROPROP-TETRAFLUOROETH EX AERO: 1.0000 "application " | INHALATION_SPRAY | CUTANEOUS | Status: DC | PRN

## 2016-02-15 MED ORDER — TRAMADOL HCL 50 MG PO TABS
ORAL_TABLET | ORAL | Status: AC
  Administered 2016-02-15: 50 mg via ORAL
  Filled 2016-02-15: qty 1

## 2016-02-15 MED ORDER — SEVELAMER CARBONATE 800 MG PO TABS
3200.0000 mg | ORAL_TABLET | Freq: Three times a day (TID) | ORAL | Status: DC
  Filled 2016-02-15: qty 4

## 2016-02-15 NOTE — ED Triage Notes (Signed)
Pt here for dialysis tx, last tx was Monday.  Pt has distention in abdomen, pt has no complaints, no pain. Alert and oriented.
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
## 2016-02-15 NOTE — Progress Notes (Signed)
Upon arrival to room after dialysis, pt requested AMA form. Pt educated about leaving AMA. AMA form signed, pt denies wheelchair escort out.   Celestia Khat, RN

## 2016-02-15 NOTE — Procedures (Signed)
   HEMODIALYSIS TREATMENT NOTE:  4 hour low-heparin dialysis completed via right upper arm AVF (15g ante/retrograde). Goal nearly met:  3.9 liters removed without interruption in ultrafiltration.  Pt terminated 4.5 hour session 30 minutes early/AMA in order to catch a bus ride home to [place].  Sodium, fluid, and renal dietary restrictions were encouraged.  All blood was returned and hemostasis was achieved within 12 minutes.  Report called to Barbra Sarks, RN.  Rockwell Alexandria, RN, CDN

## 2016-02-15 NOTE — Discharge Summary (Signed)
Physician Discharge Summary  Sara [name] [id number] DOB: [birth year] DOA: 02/15/2016  PCP: Pcp Not In System  Admit date: 02/15/2016 Discharge date: 02/15/2016  Patient left against medical advise   Brief/Interim Summary: 35 y.o. female with medical history significant of ESRD on [name] HD, noncompliance, bipolar, Lupus who presents to the ED with complaint of sob. Patient was recently seen in ED on Wed for HD but elected to leave because "I had personal things to do." Patient presented today with complaints of worsening sob. Denies chest pain or nausea  1. ESRD with volume overload 1. CXR reviewed. Patient presented in volume overload after missing last scheduled HD session 2. Consulted Nephrology and patient underwent HD on 11/10 2. Lupus 1. Continued prednisone per home regimen 2. Appeared stable at presentation 3. Hx noncompliance 1. Encouraged compliance with treatment 2. Missed HD recently per above  Discharge Diagnoses:  Principal Problem:   Volume overload Active Problems:   Lupus nephritis ([place])   Systemic lupus (HCC)   ESRD (end stage renal disease) ([place])   Noncompliance    Discharge Instructions    Allergies  Allergen Reactions  . Ativan [Lorazepam] Swelling and Other (See Comments)    Dysarthria(patient has difficulty speaking and slurred speech); denies swelling, itching, pain, or numbness.  [name] [Ziprasidone Hcl] Itching and Swelling    Tongue swelling  . Keflex [Cephalexin] Swelling and Other (See Comments)    Tongue swelling. Can't talk   . Haldol [Haloperidol Lactate] Swelling    Tongue swelling. 05/31/15 - MD ok with giving as pt has tolerated in the past Pt can take benadryl.  . Other Itching    wool    Consultations:  Nephrology  Procedures/Studies: Dg Chest 2 View  Result Date: 02/15/2016 CLINICAL DATA:  Three days of cough, end-stage renal disease on dialysis and is currently in need of dialysis. History of lupus nephritis and  end-stage renal disease. CHF EXAM: CHEST  2 VIEW COMPARISON:  PA and lateral chest x-ray of October 29, 2015 FINDINGS: The lungs are adequately inflated. The lung markings are coarse in the retrocardiac region on the left. The interstitial markings are increased bilaterally. There is minimal blunting of the costophrenic angles bilaterally. The cardiac silhouette is enlarged. The pulmonary vascularity is prominent centrally. There is calcification in the wall of the aortic arch. There is old deformity of the lateral aspect of the right 6 rib. IMPRESSION: CHF with mild interstitial edema and small bilateral pleural effusions. Early left lower lobe atelectasis is suspected as well. Aortic atherosclerosis. Electronically Signed   By: David  Martinique M.D.   On: 02/15/2016 08:15     Discharge Exam: Vitals:   02/15/16 1530 02/15/16 1600  BP: (!) 164/104 (!) 159/103  Pulse: (!) 112 (!) 110  Resp:    Temp:     Vitals:   02/15/16 1430 02/15/16 1500 02/15/16 1530 02/15/16 1600  BP: (!) 149/100 (!) 145/103 (!) 164/104 (!) 159/103  Pulse: (!) 109 (!) 110 (!) 112 (!) 110  Resp:      Temp:      TempSrc:      SpO2:      Weight:      Height:        Physical exam: Left against medical advise   The results of significant diagnostics from this hospitalization (including imaging, microbiology, ancillary and laboratory) are listed below for reference.     Microbiology: No results found for this or any previous visit (from the past 240 hour(s)).  Labs: BNP (last 3 results) No results for input(s): BNP in the last 8760 hours. Basic Metabolic Panel:  Recent Labs Lab 02/09/16 1305 02/11/16 0857 02/13/16 0939 02/15/16 0929  NA 135 135 137 137  K 5.9* 5.0 4.0 4.2  CL 97* 97* 96* 98*  CO2 22 25 28 25   GLUCOSE 75 100* 95 111*  BUN 79* 51* 37* 59*  CREATININE 14.73* 10.65* 9.31* 12.74*  12.94*  CALCIUM 9.2 9.4 9.2 8.8*  PHOS 10.7*  --   --  8.6*   Liver Function Tests:  Recent Labs Lab  02/09/16 1305 02/15/16 0929  ALBUMIN 3.1* 3.0*   No results for input(s): LIPASE, AMYLASE in the last 168 hours. No results for input(s): AMMONIA in the last 168 hours. CBC:  Recent Labs Lab 02/09/16 1305 02/11/16 0857 02/13/16 0939 02/15/16 0929  WBC 7.1 5.5 5.8 5.8  NEUTROABS  --  3.6 3.9  --   HGB 7.5* 7.9* 8.3* 8.2*  HCT 23.2* 24.6* 27.0* 25.8*  MCV 80.8 81.7 83.3 82.2  PLT 212 196 242 259   Cardiac Enzymes: No results for input(s): CKTOTAL, CKMB, CKMBINDEX, TROPONINI in the last 168 hours. BNP: Invalid input(s): POCBNP CBG: No results for input(s): GLUCAP in the last 168 hours. D-Dimer No results for input(s): DDIMER in the last 72 hours. Hgb A1c No results for input(s): HGBA1C in the last 72 hours. Lipid Profile No results for input(s): CHOL, HDL, LDLCALC, TRIG, CHOLHDL, LDLDIRECT in the last 72 hours. Thyroid function studies No results for input(s): TSH, T4TOTAL, T3FREE, THYROIDAB in the last 72 hours.  Invalid input(s): FREET3 Anemia work up No results for input(s): VITAMINB12, FOLATE, FERRITIN, TIBC, IRON, RETICCTPCT in the last 72 hours. Urinalysis    Component Value Date/Time   COLORURINE YELLOW 08/11/2015 1235   APPEARANCEUR HAZY (A) 08/11/2015 1235   LABSPEC 1.015 08/11/2015 1235   PHURINE 7.5 08/11/2015 1235   GLUCOSEU 100 (A) 08/11/2015 1235   HGBUR SMALL (A) 08/11/2015 1235   BILIRUBINUR NEGATIVE 08/11/2015 1235   KETONESUR NEGATIVE 08/11/2015 1235   PROTEINUR >300 (A) 08/11/2015 1235   UROBILINOGEN 0.2 01/09/2015 1645   NITRITE NEGATIVE 08/11/2015 1235   LEUKOCYTESUR SMALL (A) 08/11/2015 1235   Sepsis Labs Invalid input(s): PROCALCITONIN,  WBC,  LACTICIDVEN Microbiology No results found for this or any previous visit (from the past 240 hour(s)).   SIGNED:   Donne Hazel, MD  [facility] 02/15/2016, 5:39 PM  If 7PM-7AM, please contact night-coverage [email or web address] Password [name]

## 2016-02-16 LAB — PTH, INTACT AND CALCIUM
CALCIUM TOTAL (PTH): 8.7 mg/dL (ref 8.7–10.2)
PTH: 747 pg/mL — AB (ref 15–65)

## 2016-02-19 ENCOUNTER — Encounter (HOSPITAL_COMMUNITY): Payer: Self-pay | Admitting: Emergency Medicine

## 2016-02-19 ENCOUNTER — Observation Stay (HOSPITAL_COMMUNITY)
Admission: EM | Admit: 2016-02-19 | Discharge: 2016-02-19 | Disposition: A | Discharge status: Home / Self Care | Payer: Medicare Other | Attending: Family Medicine | Admitting: Family Medicine

## 2016-02-19 DIAGNOSIS — I5022 Chronic systolic (congestive) heart failure: Secondary | ICD-10-CM | POA: Insufficient documentation

## 2016-02-19 DIAGNOSIS — N19 Unspecified kidney failure: Secondary | ICD-10-CM | POA: Diagnosis present

## 2016-02-19 DIAGNOSIS — N179 Acute kidney failure, unspecified: Principal | ICD-10-CM | POA: Insufficient documentation

## 2016-02-19 DIAGNOSIS — Z992 Dependence on renal dialysis: Secondary | ICD-10-CM | POA: Diagnosis not present

## 2016-02-19 DIAGNOSIS — R0789 Other chest pain: Secondary | ICD-10-CM | POA: Diagnosis not present

## 2016-02-19 DIAGNOSIS — F250 Schizoaffective disorder, bipolar type: Secondary | ICD-10-CM | POA: Diagnosis not present

## 2016-02-19 DIAGNOSIS — N186 End stage renal disease: Secondary | ICD-10-CM | POA: Diagnosis not present

## 2016-02-19 DIAGNOSIS — M3214 Glomerular disease in systemic lupus erythematosus: Secondary | ICD-10-CM | POA: Diagnosis not present

## 2016-02-19 DIAGNOSIS — Z79899 Other long term (current) drug therapy: Secondary | ICD-10-CM | POA: Insufficient documentation

## 2016-02-19 DIAGNOSIS — I132 Hypertensive heart and chronic kidney disease with heart failure and with stage 5 chronic kidney disease, or end stage renal disease: Secondary | ICD-10-CM | POA: Insufficient documentation

## 2016-02-19 DIAGNOSIS — F17210 Nicotine dependence, cigarettes, uncomplicated: Secondary | ICD-10-CM | POA: Diagnosis not present

## 2016-02-19 DIAGNOSIS — Z7952 Long term (current) use of systemic steroids: Secondary | ICD-10-CM | POA: Diagnosis not present

## 2016-02-19 DIAGNOSIS — Z7982 Long term (current) use of aspirin: Secondary | ICD-10-CM | POA: Insufficient documentation

## 2016-02-19 DIAGNOSIS — Z9119 Patient's noncompliance with other medical treatment and regimen: Secondary | ICD-10-CM | POA: Diagnosis not present

## 2016-02-19 LAB — BASIC METABOLIC PANEL
Anion gap: 14 (ref 5–15)
BUN: 64 mg/dL — ABNORMAL HIGH (ref 6–20)
CO2: 26 mmol/L (ref 22–32)
Calcium: 9.7 mg/dL (ref 8.9–10.3)
Chloride: 97 mmol/L — ABNORMAL LOW (ref 101–111)
Creatinine, Ser: 12.96 mg/dL — ABNORMAL HIGH (ref 0.44–1.00)
GFR calc Af Amer: 4 mL/min — ABNORMAL LOW (ref >60)
GFR calc non Af Amer: 3 mL/min — ABNORMAL LOW (ref >60)
Glucose, Bld: 105 mg/dL — ABNORMAL HIGH (ref 65–99)
Potassium: 5.5 mmol/L — ABNORMAL HIGH (ref 3.5–5.1)
Sodium: 137 mmol/L (ref 135–145)

## 2016-02-19 MED ORDER — DIPHENHYDRAMINE HCL 25 MG PO CAPS: 25.0000 mg | ORAL_CAPSULE | Freq: Four times a day (QID) | ORAL | Status: DC | PRN

## 2016-02-19 MED ORDER — EPOETIN ALFA 10000 UNIT/ML IJ SOLN
10000.0000 [IU] | INTRAMUSCULAR | Status: DC
  Filled 2016-02-19: qty 1

## 2016-02-19 MED ORDER — EPOETIN ALFA 4000 UNIT/ML IJ SOLN
4000.0000 [IU] | INTRAMUSCULAR | Status: DC
  Filled 2016-02-19: qty 1

## 2016-02-19 MED ORDER — SEVELAMER CARBONATE 800 MG PO TABS
3200.0000 mg | ORAL_TABLET | Freq: Three times a day (TID) | ORAL | Status: DC
  Filled 2016-02-19: qty 4

## 2016-02-19 MED ORDER — EPOETIN ALFA 20000 UNIT/ML IJ SOLN: 14000.0000 [IU] | INTRAMUSCULAR | Status: DC
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
## 2016-02-19 NOTE — ED Triage Notes (Signed)
Patient here for dialysis. States last treatment was Friday. Complaining of "a little short of breath."

## 2016-02-19 NOTE — ED Notes (Signed)
Report given to Caren Griffins, dialysis RN

## 2016-02-19 NOTE — ED Notes (Signed)
Caren Griffins, dialysis nurse is here at [facility] with another pt and is aware of pt. Pt aware.

## 2016-02-20 ENCOUNTER — Emergency Department (HOSPITAL_COMMUNITY): Payer: Medicare Other

## 2016-02-20 ENCOUNTER — Other Ambulatory Visit: Payer: Self-pay

## 2016-02-20 ENCOUNTER — Emergency Department (HOSPITAL_COMMUNITY)
Admission: EM | Admit: 2016-02-20 | Discharge: 2016-02-20 | Disposition: A | Discharge status: Home / Self Care | Payer: Medicare Other | Attending: Emergency Medicine | Admitting: Emergency Medicine

## 2016-02-20 ENCOUNTER — Encounter (HOSPITAL_COMMUNITY): Payer: Self-pay | Admitting: Emergency Medicine

## 2016-02-20 DIAGNOSIS — I5022 Chronic systolic (congestive) heart failure: Secondary | ICD-10-CM | POA: Insufficient documentation

## 2016-02-20 DIAGNOSIS — R0789 Other chest pain: Secondary | ICD-10-CM | POA: Diagnosis not present

## 2016-02-20 DIAGNOSIS — I132 Hypertensive heart and chronic kidney disease with heart failure and with stage 5 chronic kidney disease, or end stage renal disease: Secondary | ICD-10-CM | POA: Diagnosis not present

## 2016-02-20 DIAGNOSIS — Z7982 Long term (current) use of aspirin: Secondary | ICD-10-CM | POA: Insufficient documentation

## 2016-02-20 DIAGNOSIS — N186 End stage renal disease: Secondary | ICD-10-CM | POA: Insufficient documentation

## 2016-02-20 DIAGNOSIS — F17210 Nicotine dependence, cigarettes, uncomplicated: Secondary | ICD-10-CM | POA: Diagnosis not present

## 2016-02-20 DIAGNOSIS — Z79899 Other long term (current) drug therapy: Secondary | ICD-10-CM | POA: Insufficient documentation

## 2016-02-20 DIAGNOSIS — R079 Chest pain, unspecified: Secondary | ICD-10-CM | POA: Diagnosis not present

## 2016-02-20 DIAGNOSIS — I120 Hypertensive chronic kidney disease with stage 5 chronic kidney disease or end stage renal disease: Secondary | ICD-10-CM | POA: Diagnosis not present

## 2016-02-20 DIAGNOSIS — Z992 Dependence on renal dialysis: Secondary | ICD-10-CM | POA: Insufficient documentation

## 2016-02-20 LAB — I-STAT CHEM 8, ED
BUN: 46 mg/dL — AB (ref 6–20)
CALCIUM ION: 1.09 mmol/L — AB (ref 1.15–1.40)
CHLORIDE: 101 mmol/L (ref 101–111)
Creatinine, Ser: 11.5 mg/dL — ABNORMAL HIGH (ref 0.44–1.00)
GLUCOSE: 94 mg/dL (ref 65–99)
HCT: 25 % — ABNORMAL LOW (ref 36.0–46.0)
Hemoglobin: 8.5 g/dL — ABNORMAL LOW (ref 12.0–15.0)
Potassium: 4.8 mmol/L (ref 3.5–5.1)
SODIUM: 137 mmol/L (ref 135–145)
TCO2: 25 mmol/L (ref 0–100)

## 2016-02-20 LAB — I-STAT TROPONIN, ED: TROPONIN I, POC: 0.11 ng/mL — AB (ref 0.00–0.08)

## 2016-02-20 MED ORDER — ACETAMINOPHEN 325 MG PO TABS
650.0000 mg | ORAL_TABLET | Freq: Once | ORAL | Status: AC
  Administered 2016-02-20: 650 mg via ORAL
  Filled 2016-02-20: qty 2

## 2016-02-20 NOTE — ED Notes (Signed)
Dr. Dina Rich notified that pt. wants to go home .

## 2016-02-20 NOTE — ED Notes (Signed)
Pt. evaluated by Dr. Dina Rich and explained plan of care to pt.

## 2016-02-20 NOTE — ED Triage Notes (Signed)
According to EMS pt reports non radiating substernal chest pain (9/10) that started today.  She reports that she missed dialysis Monday but was able to go Tuesday however she only endured an hour b/c of pain at the fistula.  Reports cough for two days, denies N/V/D.  Pt was given 324 ASA and 2 nitro which decreased her pain to a 7/10.  She is hypertensive and tack at 120.  Per EMS, no distress noted.
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
## 2016-02-20 NOTE — Discharge Instructions (Signed)
You were seen today for chest pain. Your workup is stable. There is no indication right now for you to have dialysis. Given that you did not have a full dialysis session earlier today, he will likely need dialysis in the next 24 hours. If your symptoms worsen you need to be reevaluated.

## 2016-02-21 ENCOUNTER — Observation Stay (HOSPITAL_COMMUNITY)
Admission: EM | Admit: 2016-02-21 | Discharge: 2016-02-21 | Disposition: A | Discharge status: Home / Self Care | Payer: Medicare Other | Attending: Internal Medicine | Admitting: Internal Medicine

## 2016-02-21 ENCOUNTER — Encounter (HOSPITAL_COMMUNITY): Payer: Self-pay | Admitting: Cardiology

## 2016-02-21 DIAGNOSIS — N186 End stage renal disease: Secondary | ICD-10-CM | POA: Diagnosis not present

## 2016-02-21 DIAGNOSIS — I10 Essential (primary) hypertension: Secondary | ICD-10-CM | POA: Diagnosis not present

## 2016-02-21 DIAGNOSIS — F17210 Nicotine dependence, cigarettes, uncomplicated: Secondary | ICD-10-CM | POA: Insufficient documentation

## 2016-02-21 DIAGNOSIS — I5022 Chronic systolic (congestive) heart failure: Secondary | ICD-10-CM | POA: Insufficient documentation

## 2016-02-21 DIAGNOSIS — Z7982 Long term (current) use of aspirin: Secondary | ICD-10-CM | POA: Diagnosis not present

## 2016-02-21 DIAGNOSIS — Z992 Dependence on renal dialysis: Secondary | ICD-10-CM | POA: Insufficient documentation

## 2016-02-21 DIAGNOSIS — Z452 Encounter for adjustment and management of vascular access device: Secondary | ICD-10-CM | POA: Diagnosis present

## 2016-02-21 DIAGNOSIS — R0602 Shortness of breath: Secondary | ICD-10-CM | POA: Diagnosis not present

## 2016-02-21 DIAGNOSIS — Z79899 Other long term (current) drug therapy: Secondary | ICD-10-CM | POA: Diagnosis not present

## 2016-02-21 DIAGNOSIS — I132 Hypertensive heart and chronic kidney disease with heart failure and with stage 5 chronic kidney disease, or end stage renal disease: Secondary | ICD-10-CM | POA: Diagnosis not present

## 2016-02-21 DIAGNOSIS — D631 Anemia in chronic kidney disease: Secondary | ICD-10-CM | POA: Diagnosis not present

## 2016-02-21 LAB — CBC
HCT: 25.1 % — ABNORMAL LOW (ref 36.0–46.0)
HEMOGLOBIN: 7.9 g/dL — AB (ref 12.0–15.0)
MCH: 26.2 pg (ref 26.0–34.0)
MCHC: 31.5 g/dL (ref 30.0–36.0)
MCV: 83.1 fL (ref 78.0–100.0)
PLATELETS: 285 10*3/uL (ref 150–400)
RBC: 3.02 MIL/uL — AB (ref 3.87–5.11)
RDW: 20.5 % — ABNORMAL HIGH (ref 11.5–15.5)
WBC: 6.6 10*3/uL (ref 4.0–10.5)

## 2016-02-21 LAB — RENAL FUNCTION PANEL
ANION GAP: 14 (ref 5–15)
Albumin: 3.2 g/dL — ABNORMAL LOW (ref 3.5–5.0)
BUN: 51 mg/dL — ABNORMAL HIGH (ref 6–20)
CALCIUM: 9.2 mg/dL (ref 8.9–10.3)
CHLORIDE: 97 mmol/L — AB (ref 101–111)
CO2: 24 mmol/L (ref 22–32)
CREATININE: 10.31 mg/dL — AB (ref 0.44–1.00)
GFR, EST AFRICAN AMERICAN: 5 mL/min — AB (ref >60)
GFR, EST NON AFRICAN AMERICAN: 4 mL/min — AB (ref >60)
Glucose, Bld: 121 mg/dL — ABNORMAL HIGH (ref 65–99)
Phosphorus: 6.4 mg/dL — ABNORMAL HIGH (ref 2.5–4.6)
Potassium: 4 mmol/L (ref 3.5–5.1)
Sodium: 135 mmol/L (ref 135–145)

## 2016-02-21 MED ORDER — SEVELAMER CARBONATE 800 MG PO TABS
3200.0000 mg | ORAL_TABLET | Freq: Three times a day (TID) | ORAL | Status: DC
  Filled 2016-02-21 (×4): qty 4

## 2016-02-21 MED ORDER — LIDOCAINE-PRILOCAINE 2.5-2.5 % EX CREA: 1.0000 "application " | TOPICAL_CREAM | CUTANEOUS | Status: DC | PRN

## 2016-02-21 MED ORDER — PREDNISONE 20 MG PO TABS: 20.0000 mg | ORAL_TABLET | Freq: Every day | ORAL | Status: DC

## 2016-02-21 MED ORDER — DIPHENHYDRAMINE HCL 25 MG PO CAPS: 25.0000 mg | ORAL_CAPSULE | Freq: Four times a day (QID) | ORAL | Status: DC | PRN

## 2016-02-21 MED ORDER — HEPARIN SODIUM (PORCINE) 1000 UNIT/ML DIALYSIS
20.0000 [IU]/kg | INTRAMUSCULAR | Status: DC | PRN
  Filled 2016-02-21: qty 2

## 2016-02-21 MED ORDER — LIDOCAINE HCL (PF) 1 % IJ SOLN: 5.0000 mL | INTRAMUSCULAR | Status: DC | PRN

## 2016-02-21 MED ORDER — DIPHENHYDRAMINE HCL 25 MG PO CAPS
ORAL_CAPSULE | ORAL | Status: AC
  Administered 2016-02-21: 25 mg via ORAL
  Filled 2016-02-21: qty 1

## 2016-02-21 MED ORDER — PENTAFLUOROPROP-TETRAFLUOROETH EX AERO: 1.0000 "application " | INHALATION_SPRAY | CUTANEOUS | Status: DC | PRN

## 2016-02-21 MED ORDER — EPOETIN ALFA 20000 UNIT/ML IJ SOLN
14000.0000 [IU] | INTRAMUSCULAR | Status: DC
  Administered 2016-02-21: 14000 [IU] via SUBCUTANEOUS
  Filled 2016-02-21: qty 1

## 2016-02-21 MED ORDER — HEPARIN SODIUM (PORCINE) 1000 UNIT/ML DIALYSIS
1000.0000 [IU] | INTRAMUSCULAR | Status: DC | PRN
  Filled 2016-02-21: qty 1

## 2016-02-21 MED ORDER — SODIUM CHLORIDE 0.9 % IV SOLN: 100.0000 mL | INTRAVENOUS | Status: DC | PRN

## 2016-02-21 MED ORDER — ASPIRIN-ACETAMINOPHEN-CAFFEINE 250-250-65 MG PO TABS: 2.0000 | ORAL_TABLET | Freq: Four times a day (QID) | ORAL | Status: DC | PRN

## 2016-02-21 MED ORDER — ACETAMINOPHEN 500 MG PO TABS: 1000.0000 mg | ORAL_TABLET | Freq: Four times a day (QID) | ORAL | Status: DC | PRN

## 2016-02-21 MED ORDER — ASPIRIN 325 MG PO TABS: 650.0000 mg | ORAL_TABLET | Freq: Four times a day (QID) | ORAL | Status: DC | PRN

## 2016-02-21 MED ORDER — ACETAMINOPHEN 325 MG PO TABS
650.0000 mg | ORAL_TABLET | Freq: Four times a day (QID) | ORAL | Status: DC | PRN
Start: 2016-02-21 — End: 2016-02-21

## 2016-02-21 MED ORDER — EPOETIN ALFA 20000 UNIT/ML IJ SOLN
INTRAMUSCULAR | Status: AC
  Administered 2016-02-21: 14000 [IU] via SUBCUTANEOUS
  Filled 2016-02-21: qty 1

## 2016-02-21 MED ORDER — TRAMADOL HCL 50 MG PO TABS
ORAL_TABLET | ORAL | Status: AC
  Administered 2016-02-21: 50 mg via ORAL
  Filled 2016-02-21: qty 1

## 2016-02-21 MED ORDER — RISPERIDONE MICROSPHERES 25 MG IM SUSR: 25.0000 mg | INTRAMUSCULAR | Status: DC

## 2016-02-21 MED ORDER — TRAMADOL HCL 50 MG PO TABS
50.0000 mg | ORAL_TABLET | Freq: Four times a day (QID) | ORAL | Status: DC | PRN
  Administered 2016-02-21: 50 mg via ORAL

## 2016-02-21 MED ORDER — DIPHENHYDRAMINE HCL 25 MG PO CAPS
25.0000 mg | ORAL_CAPSULE | Freq: Once | ORAL | Status: AC
  Administered 2016-02-21: 25 mg via ORAL

## 2016-02-21 MED ORDER — ALTEPLASE 2 MG IJ SOLR
2.0000 mg | Freq: Once | INTRAMUSCULAR | Status: DC | PRN
  Filled 2016-02-21: qty 2

## 2016-02-21 MED ORDER — METOPROLOL TARTRATE 50 MG PO TABS: 50.0000 mg | ORAL_TABLET | Freq: Two times a day (BID) | ORAL | Status: DC
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
## 2016-02-21 NOTE — ED Notes (Signed)
PT states she is here for dialysis today. Last dialysis on 02/19/16.

## 2016-02-21 NOTE — ED Triage Notes (Signed)
States she is here for dialysis.  Last treatment Tuesday and was on the machine for an hour and states she had to stop due to pain.

## 2016-02-21 NOTE — Progress Notes (Signed)
Pt arrived from dialysis and signed AMA paperwork.
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
## 2016-02-21 NOTE — Discharge Summary (Signed)
Physician Discharge Summary  Sara [name] [name] DOB: 29-Oct-1980 DOA: 02/21/2016  PCP: Pcp Not In System  Admit date: 02/21/2016 Discharge date: 02/21/2016  Admitted From: Home  Disposition:  Home   Recommendations for Outpatient Follow-up:  1. Will return for HD on schedule.   Home Health: No  Equipment/Devices: None   Discharge Condition: Stable  CODE STATUS: FULL  Diet recommendation: Heart healthy   Brief/Interim Summary: Sara [name] is a 35 y.o. female with medical history significant for end-stage renal disease on Monday, Wednesday, and Friday. Patient has been banned from outpatient dialysis, presents to the hospital for dialysis. Nephrology was consulted and she had significant improvement with creatinine.   Discharge Diagnoses:  Active Problems:   ESRD on hemodialysis ([place])   ESRD (end stage renal disease) ([place])  ESRD on HD - Patient presented today for hemodialysis because she cannot have dialysis as an outpatient. - Nephrology was consulted and she received dialysis.  - Discharged home.   HTN - Pressures improved with dialysis.  - Continue metoprolol as an outpatient.   Systemic Lupus - Continue outpatient medication, prednisone  Discharge Instructions  Discharge Instructions    Diet - low sodium heart healthy    Complete by:  As directed    Increase activity slowly    Complete by:  As directed        Allergies  Allergen Reactions  . Ativan [Lorazepam] Swelling and Other (See Comments)    Dysarthria(patient has difficulty speaking and slurred speech); denies swelling, itching, pain, or numbness.  [name] [Ziprasidone Hcl] Itching and Swelling    Tongue swelling  . Keflex [Cephalexin] Swelling and Other (See Comments)    Tongue swelling. Can't talk   . Haldol [Haloperidol Lactate] Swelling    Tongue swelling. 05/31/15 - MD ok with giving as pt has tolerated in the past Pt can take benadryl.  . Other Itching    wool     Consultations:  Nephrology: Fran Lowes, MD   Procedures/Studies: Dg Chest 2 View  Result Date: 02/15/2016 CLINICAL DATA:  Three days of cough, end-stage renal disease on dialysis and is currently in need of dialysis. History of lupus nephritis and end-stage renal disease. CHF EXAM: CHEST  2 VIEW COMPARISON:  PA and lateral chest x-ray of October 29, 2015 FINDINGS: The lungs are adequately inflated. The lung markings are coarse in the retrocardiac region on the left. The interstitial markings are increased bilaterally. There is minimal blunting of the costophrenic angles bilaterally. The cardiac silhouette is enlarged. The pulmonary vascularity is prominent centrally. There is calcification in the wall of the aortic arch. There is old deformity of the lateral aspect of the right 6 rib. IMPRESSION: CHF with mild interstitial edema and small bilateral pleural effusions. Early left lower lobe atelectasis is suspected as well. Aortic atherosclerosis. Electronically Signed   By: David  Martinique M.D.   On: 02/15/2016 08:15   Dg Chest Portable 1 View  Result Date: 02/20/2016 CLINICAL DATA:  Midchest pain tonight EXAM: PORTABLE CHEST 1 VIEW COMPARISON:  02/15/2016 FINDINGS: There is moderate unchanged cardiomegaly. The lungs are clear. There is mild costophrenic angle blunting which could represent tiny pleural effusions. Mediastinal and hilar contours are unremarkable and unchanged. Lysis of the distal clavicles bilaterally. IMPRESSION: Unchanged cardiomegaly.  Possible small effusions. Electronically Signed   By: Andreas Newport M.D.   On: 02/20/2016 01:01      The results of significant diagnostics from this hospitalization (including imaging, microbiology, ancillary and laboratory)  are listed below for reference.     Microbiology: No results found for this or any previous visit (from the past 240 hour(s)).   Labs: BNP (last 3 results) No results for input(s): BNP in the last 8760  hours. Basic Metabolic Panel:  Recent Labs Lab 02/15/16 0929 02/15/16 1425 02/19/16 1057 02/20/16 0110  NA 137  --  137 137  K 4.2  --  5.5* 4.8  CL 98*  --  97* 101  CO2 25  --  26  --   GLUCOSE 111*  --  105* 94  BUN 59*  --  64* 46*  CREATININE 12.74*  12.94*  --  12.96* 11.50*  CALCIUM 8.8* 8.7 9.7  --   PHOS 8.6*  --   --   --    Liver Function Tests:  Recent Labs Lab 02/15/16 0929  ALBUMIN 3.0*   No results for input(s): LIPASE, AMYLASE in the last 168 hours. No results for input(s): AMMONIA in the last 168 hours. CBC:  Recent Labs Lab 02/15/16 0929 02/20/16 0110  WBC 5.8  --   HGB 8.2* 8.5*  HCT 25.8* 25.0*  MCV 82.2  --   PLT 259  --    Cardiac Enzymes: No results for input(s): CKTOTAL, CKMB, CKMBINDEX, TROPONINI in the last 168 hours. BNP: Invalid input(s): POCBNP CBG: No results for input(s): GLUCAP in the last 168 hours. D-Dimer No results for input(s): DDIMER in the last 72 hours. Hgb A1c No results for input(s): HGBA1C in the last 72 hours. Lipid Profile No results for input(s): CHOL, HDL, LDLCALC, TRIG, CHOLHDL, LDLDIRECT in the last 72 hours. Thyroid function studies No results for input(s): TSH, T4TOTAL, T3FREE, THYROIDAB in the last 72 hours.  Invalid input(s): FREET3 Anemia work up No results for input(s): VITAMINB12, FOLATE, FERRITIN, TIBC, IRON, RETICCTPCT in the last 72 hours. Urinalysis    Component Value Date/Time   COLORURINE YELLOW 08/11/2015 1235   APPEARANCEUR HAZY (A) 08/11/2015 1235   LABSPEC 1.015 08/11/2015 1235   PHURINE 7.5 08/11/2015 1235   GLUCOSEU 100 (A) 08/11/2015 1235   HGBUR SMALL (A) 08/11/2015 1235   BILIRUBINUR NEGATIVE 08/11/2015 1235   KETONESUR NEGATIVE 08/11/2015 1235   PROTEINUR >300 (A) 08/11/2015 1235   UROBILINOGEN 0.2 01/09/2015 1645   NITRITE NEGATIVE 08/11/2015 1235   LEUKOCYTESUR SMALL (A) 08/11/2015 1235   Sepsis Labs Invalid input(s): PROCALCITONIN,  WBC,  LACTICIDVEN Microbiology No  results found for this or any previous visit (from the past 240 hour(s)).   Time coordinating discharge: Over 30 minutes  SIGNED:   Domingo Mend, MD [facility] 02/21/2016, 12:42 PM If 7PM-7AM, please contact night-coverage [email or web address] Password [name]    By signing my name below, I, Collene Leyden, attest that this documentation has been prepared under the direction and in the presence of Domingo Mend MD. Electronically signed: Collene Leyden, Scribe. 02/21/16    I have reviewed the above documentation for accuracy and completeness, and I agree with the above.  Domingo Mend, MD [facility] Pager: [phone]
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
## 2016-02-22 ENCOUNTER — Encounter (HOSPITAL_COMMUNITY): Payer: Self-pay | Admitting: Emergency Medicine

## 2016-02-22 ENCOUNTER — Emergency Department (HOSPITAL_COMMUNITY)
Admission: EM | Admit: 2016-02-22 | Discharge: 2016-02-22 | Disposition: A | Discharge status: Home / Self Care | Payer: Medicare Other | Attending: Emergency Medicine | Admitting: Emergency Medicine

## 2016-02-22 ENCOUNTER — Other Ambulatory Visit: Payer: Self-pay

## 2016-02-22 DIAGNOSIS — I5022 Chronic systolic (congestive) heart failure: Secondary | ICD-10-CM | POA: Diagnosis not present

## 2016-02-22 DIAGNOSIS — I132 Hypertensive heart and chronic kidney disease with heart failure and with stage 5 chronic kidney disease, or end stage renal disease: Secondary | ICD-10-CM | POA: Insufficient documentation

## 2016-02-22 DIAGNOSIS — Z992 Dependence on renal dialysis: Secondary | ICD-10-CM | POA: Diagnosis not present

## 2016-02-22 DIAGNOSIS — F17210 Nicotine dependence, cigarettes, uncomplicated: Secondary | ICD-10-CM | POA: Insufficient documentation

## 2016-02-22 DIAGNOSIS — Z7982 Long term (current) use of aspirin: Secondary | ICD-10-CM | POA: Diagnosis not present

## 2016-02-22 DIAGNOSIS — Z79899 Other long term (current) drug therapy: Secondary | ICD-10-CM | POA: Diagnosis not present

## 2016-02-22 DIAGNOSIS — N186 End stage renal disease: Secondary | ICD-10-CM | POA: Diagnosis not present

## 2016-02-22 DIAGNOSIS — Z452 Encounter for adjustment and management of vascular access device: Secondary | ICD-10-CM | POA: Diagnosis not present

## 2016-02-22 NOTE — ED Notes (Signed)
Pt made aware to return if symptoms worsen or if any life threatening symptoms occur.   

## 2016-02-22 NOTE — Discharge Instructions (Signed)
You were seen in the ED today for routine hemodialysis. Since you had dialysis yesterday there is no need to perform dialysis today. Return to the ED if you begin to have trouble breathing, chest pain, fainting, or fever.

## 2016-02-22 NOTE — ED Triage Notes (Signed)
PT states on 02/19/16 had one hour of dialysis and on 02/21/16 she received 3 hours on dialysis machine. PT states she is here for her normal dialysis day of Friday to get back on schedule of (M/W/F).
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
## 2016-02-22 NOTE — Patient Outreach (Signed)
[facility]) Care Management  02/22/2016  MAEVYN MAHNKEN [birth year] [id number]   Telephone call to patient for high ED utilization.  No answer.  HIPAA compliant voice message left.  Plan: RN Health Coach will attempt patient again in 10 business days.  Jone Baseman, RN, MSN [place] [phone]

## 2016-02-23 ENCOUNTER — Encounter (HOSPITAL_COMMUNITY): Payer: Self-pay | Admitting: Emergency Medicine

## 2016-02-23 ENCOUNTER — Emergency Department (HOSPITAL_COMMUNITY): Payer: Medicare Other

## 2016-02-23 ENCOUNTER — Emergency Department (HOSPITAL_COMMUNITY)
Admission: EM | Admit: 2016-02-23 | Discharge: 2016-02-23 | Disposition: A | Discharge status: Home / Self Care | Payer: Medicare Other | Attending: Emergency Medicine | Admitting: Emergency Medicine

## 2016-02-23 DIAGNOSIS — F17210 Nicotine dependence, cigarettes, uncomplicated: Secondary | ICD-10-CM | POA: Diagnosis not present

## 2016-02-23 DIAGNOSIS — I132 Hypertensive heart and chronic kidney disease with heart failure and with stage 5 chronic kidney disease, or end stage renal disease: Secondary | ICD-10-CM | POA: Insufficient documentation

## 2016-02-23 DIAGNOSIS — Z79899 Other long term (current) drug therapy: Secondary | ICD-10-CM | POA: Diagnosis not present

## 2016-02-23 DIAGNOSIS — Z992 Dependence on renal dialysis: Secondary | ICD-10-CM | POA: Insufficient documentation

## 2016-02-23 DIAGNOSIS — I5022 Chronic systolic (congestive) heart failure: Secondary | ICD-10-CM | POA: Insufficient documentation

## 2016-02-23 DIAGNOSIS — I120 Hypertensive chronic kidney disease with stage 5 chronic kidney disease or end stage renal disease: Secondary | ICD-10-CM | POA: Diagnosis not present

## 2016-02-23 DIAGNOSIS — N186 End stage renal disease: Secondary | ICD-10-CM | POA: Insufficient documentation

## 2016-02-23 DIAGNOSIS — R69 Illness, unspecified: Secondary | ICD-10-CM | POA: Diagnosis present

## 2016-02-23 DIAGNOSIS — R05 Cough: Secondary | ICD-10-CM | POA: Diagnosis not present

## 2016-02-23 LAB — I-STAT CHEM 8, ED
BUN: 47 mg/dL — AB (ref 6–20)
CHLORIDE: 99 mmol/L — AB (ref 101–111)
CREATININE: 11.9 mg/dL — AB (ref 0.44–1.00)
Calcium, Ion: 1.1 mmol/L — ABNORMAL LOW (ref 1.15–1.40)
GLUCOSE: 128 mg/dL — AB (ref 65–99)
HCT: 29 % — ABNORMAL LOW (ref 36.0–46.0)
Hemoglobin: 9.9 g/dL — ABNORMAL LOW (ref 12.0–15.0)
POTASSIUM: 5 mmol/L (ref 3.5–5.1)
Sodium: 136 mmol/L (ref 135–145)
TCO2: 26 mmol/L (ref 0–100)

## 2016-02-23 MED ORDER — DIPHENHYDRAMINE HCL 25 MG PO CAPS: 25.0000 mg | ORAL_CAPSULE | Freq: Four times a day (QID) | ORAL | Status: DC | PRN

## 2016-02-23 MED ORDER — EPOETIN ALFA 20000 UNIT/ML IJ SOLN: 14000.0000 [IU] | INTRAMUSCULAR | Status: DC

## 2016-02-23 MED ORDER — SEVELAMER CARBONATE 800 MG PO TABS: 3200.0000 mg | ORAL_TABLET | Freq: Three times a day (TID) | ORAL | Status: DC

## 2016-02-23 NOTE — ED Triage Notes (Signed)
Pt reports here for dialysis. Pt reports was last dialyzed on Thursday. Pt denies pain. nad noted.

## 2016-02-23 NOTE — Discharge Instructions (Signed)
Potassium today is 5. This is in normal range. Chest x-ray shows some mild the fluid on the lungs. Return for any new or worse symptoms. Otherwise plan on returning on Monday for dialysis.

## 2016-02-23 NOTE — ED Notes (Signed)
Pt here for dialysis. Message left for [facility] @ [phone] that pt has arrived.
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
## 2016-02-25 ENCOUNTER — Encounter (HOSPITAL_COMMUNITY): Payer: Self-pay | Admitting: *Deleted

## 2016-02-25 ENCOUNTER — Observation Stay (HOSPITAL_COMMUNITY)
Admission: EM | Admit: 2016-02-25 | Discharge: 2016-02-25 | Disposition: A | Discharge status: Home / Self Care | Payer: Medicare Other | Attending: Internal Medicine | Admitting: Internal Medicine

## 2016-02-25 DIAGNOSIS — Z7982 Long term (current) use of aspirin: Secondary | ICD-10-CM | POA: Diagnosis not present

## 2016-02-25 DIAGNOSIS — D631 Anemia in chronic kidney disease: Secondary | ICD-10-CM | POA: Diagnosis not present

## 2016-02-25 DIAGNOSIS — R0602 Shortness of breath: Secondary | ICD-10-CM | POA: Diagnosis not present

## 2016-02-25 DIAGNOSIS — N186 End stage renal disease: Secondary | ICD-10-CM | POA: Insufficient documentation

## 2016-02-25 DIAGNOSIS — Z7952 Long term (current) use of systemic steroids: Secondary | ICD-10-CM | POA: Diagnosis not present

## 2016-02-25 DIAGNOSIS — M3214 Glomerular disease in systemic lupus erythematosus: Secondary | ICD-10-CM | POA: Diagnosis present

## 2016-02-25 DIAGNOSIS — I132 Hypertensive heart and chronic kidney disease with heart failure and with stage 5 chronic kidney disease, or end stage renal disease: Secondary | ICD-10-CM | POA: Insufficient documentation

## 2016-02-25 DIAGNOSIS — I5022 Chronic systolic (congestive) heart failure: Secondary | ICD-10-CM | POA: Diagnosis not present

## 2016-02-25 DIAGNOSIS — E875 Hyperkalemia: Principal | ICD-10-CM | POA: Diagnosis present

## 2016-02-25 DIAGNOSIS — Z9115 Patient's noncompliance with renal dialysis: Secondary | ICD-10-CM | POA: Diagnosis not present

## 2016-02-25 DIAGNOSIS — F250 Schizoaffective disorder, bipolar type: Secondary | ICD-10-CM | POA: Diagnosis present

## 2016-02-25 DIAGNOSIS — Z79899 Other long term (current) drug therapy: Secondary | ICD-10-CM | POA: Insufficient documentation

## 2016-02-25 DIAGNOSIS — I10 Essential (primary) hypertension: Secondary | ICD-10-CM | POA: Diagnosis present

## 2016-02-25 DIAGNOSIS — Z992 Dependence on renal dialysis: Secondary | ICD-10-CM | POA: Diagnosis not present

## 2016-02-25 DIAGNOSIS — F17210 Nicotine dependence, cigarettes, uncomplicated: Secondary | ICD-10-CM | POA: Diagnosis not present

## 2016-02-25 DIAGNOSIS — I120 Hypertensive chronic kidney disease with stage 5 chronic kidney disease or end stage renal disease: Secondary | ICD-10-CM | POA: Diagnosis not present

## 2016-02-25 LAB — ALBUMIN: ALBUMIN: 3.2 g/dL — AB (ref 3.5–5.0)

## 2016-02-25 LAB — CBC WITH DIFFERENTIAL/PLATELET
BASOS ABS: 0 10*3/uL (ref 0.0–0.1)
BASOS PCT: 0 %
EOS ABS: 0 10*3/uL (ref 0.0–0.7)
EOS PCT: 0 %
HCT: 26.6 % — ABNORMAL LOW (ref 36.0–46.0)
Hemoglobin: 8.3 g/dL — ABNORMAL LOW (ref 12.0–15.0)
Lymphocytes Relative: 9 %
Lymphs Abs: 0.7 10*3/uL (ref 0.7–4.0)
MCH: 26.2 pg (ref 26.0–34.0)
MCHC: 31.2 g/dL (ref 30.0–36.0)
MCV: 83.9 fL (ref 78.0–100.0)
MONO ABS: 0.2 10*3/uL (ref 0.1–1.0)
Monocytes Relative: 2 %
Neutro Abs: 7.3 10*3/uL (ref 1.7–7.7)
Neutrophils Relative %: 89 %
PLATELETS: 304 10*3/uL (ref 150–400)
RBC: 3.17 MIL/uL — ABNORMAL LOW (ref 3.87–5.11)
RDW: 20.5 % — AB (ref 11.5–15.5)
WBC: 8.2 10*3/uL (ref 4.0–10.5)

## 2016-02-25 LAB — BASIC METABOLIC PANEL
ANION GAP: 16 — AB (ref 5–15)
BUN: 74 mg/dL — ABNORMAL HIGH (ref 6–20)
CALCIUM: 9.5 mg/dL (ref 8.9–10.3)
CO2: 24 mmol/L (ref 22–32)
CREATININE: 13.85 mg/dL — AB (ref 0.44–1.00)
Chloride: 95 mmol/L — ABNORMAL LOW (ref 101–111)
GFR, EST AFRICAN AMERICAN: 4 mL/min — AB (ref >60)
GFR, EST NON AFRICAN AMERICAN: 3 mL/min — AB (ref >60)
Glucose, Bld: 134 mg/dL — ABNORMAL HIGH (ref 65–99)
Potassium: 6.1 mmol/L — ABNORMAL HIGH (ref 3.5–5.1)
Sodium: 135 mmol/L (ref 135–145)

## 2016-02-25 LAB — PHOSPHORUS: Phosphorus: 8.2 mg/dL — ABNORMAL HIGH (ref 2.5–4.6)

## 2016-02-25 MED ORDER — ALTEPLASE 2 MG IJ SOLR: 2.0000 mg | Freq: Once | INTRAMUSCULAR | Status: DC | PRN

## 2016-02-25 MED ORDER — METOPROLOL TARTRATE 50 MG PO TABS: 50.0000 mg | ORAL_TABLET | Freq: Two times a day (BID) | ORAL | Status: DC

## 2016-02-25 MED ORDER — ACETAMINOPHEN 325 MG PO TABS: 650.0000 mg | ORAL_TABLET | Freq: Four times a day (QID) | ORAL | Status: DC | PRN

## 2016-02-25 MED ORDER — SODIUM CHLORIDE 0.9 % IV SOLN: 100.0000 mL | INTRAVENOUS | Status: DC | PRN

## 2016-02-25 MED ORDER — TRAMADOL HCL 50 MG PO TABS
ORAL_TABLET | ORAL | Status: AC
Start: 2016-02-25 — End: 2016-02-25
  Administered 2016-02-25: 50 mg
  Filled 2016-02-25: qty 1

## 2016-02-25 MED ORDER — ALBUTEROL SULFATE (2.5 MG/3ML) 0.083% IN NEBU: 2.5000 mg | INHALATION_SOLUTION | RESPIRATORY_TRACT | Status: DC | PRN

## 2016-02-25 MED ORDER — HEPARIN SODIUM (PORCINE) 1000 UNIT/ML DIALYSIS: 1000.0000 [IU] | INTRAMUSCULAR | Status: DC | PRN

## 2016-02-25 MED ORDER — HEPARIN SODIUM (PORCINE) 1000 UNIT/ML DIALYSIS: 20.0000 [IU]/kg | INTRAMUSCULAR | Status: DC | PRN

## 2016-02-25 MED ORDER — SODIUM CHLORIDE 0.9% FLUSH: 3.0000 mL | Freq: Two times a day (BID) | INTRAVENOUS | Status: DC

## 2016-02-25 MED ORDER — SODIUM CHLORIDE 0.9% FLUSH: 3.0000 mL | INTRAVENOUS | Status: DC | PRN

## 2016-02-25 MED ORDER — SEVELAMER CARBONATE 800 MG PO TABS: 3200.0000 mg | ORAL_TABLET | Freq: Three times a day (TID) | ORAL | Status: DC

## 2016-02-25 MED ORDER — RISPERIDONE MICROSPHERES 25 MG IM SUSR: 25.0000 mg | INTRAMUSCULAR | Status: DC

## 2016-02-25 MED ORDER — ACETAMINOPHEN 650 MG RE SUPP: 650.0000 mg | Freq: Four times a day (QID) | RECTAL | Status: DC | PRN

## 2016-02-25 MED ORDER — PENTAFLUOROPROP-TETRAFLUOROETH EX AERO: 1.0000 "application " | INHALATION_SPRAY | CUTANEOUS | Status: DC | PRN

## 2016-02-25 MED ORDER — HEPARIN SODIUM (PORCINE) 1000 UNIT/ML DIALYSIS
20.0000 [IU]/kg | INTRAMUSCULAR | Status: DC | PRN
Start: 2016-02-25 — End: 2016-02-25

## 2016-02-25 MED ORDER — ONDANSETRON HCL 4 MG PO TABS: 4.0000 mg | ORAL_TABLET | Freq: Four times a day (QID) | ORAL | Status: DC | PRN

## 2016-02-25 MED ORDER — DIPHENHYDRAMINE HCL 25 MG PO CAPS
25.0000 mg | ORAL_CAPSULE | Freq: Four times a day (QID) | ORAL | Status: DC | PRN
  Administered 2016-02-25: 25 mg via ORAL
  Filled 2016-02-25: qty 1

## 2016-02-25 MED ORDER — EPOETIN ALFA 20000 UNIT/ML IJ SOLN
14000.0000 [IU] | INTRAMUSCULAR | Status: DC
  Administered 2016-02-25: 14000 [IU] via SUBCUTANEOUS

## 2016-02-25 MED ORDER — PREDNISONE 20 MG PO TABS: 20.0000 mg | ORAL_TABLET | Freq: Every day | ORAL | Status: DC

## 2016-02-25 MED ORDER — ONDANSETRON HCL 4 MG/2ML IJ SOLN
4.0000 mg | Freq: Four times a day (QID) | INTRAMUSCULAR | Status: DC | PRN
Start: 2016-02-25 — End: 2016-02-25

## 2016-02-25 MED ORDER — LIDOCAINE-PRILOCAINE 2.5-2.5 % EX CREA: 1.0000 "application " | TOPICAL_CREAM | CUTANEOUS | Status: DC | PRN

## 2016-02-25 MED ORDER — TRAMADOL HCL 50 MG PO TABS: 50.0000 mg | ORAL_TABLET | Freq: Four times a day (QID) | ORAL | Status: DC | PRN

## 2016-02-25 MED ORDER — LIDOCAINE HCL (PF) 1 % IJ SOLN: 5.0000 mL | INTRAMUSCULAR | Status: DC | PRN

## 2016-02-25 MED ORDER — HEPARIN SODIUM (PORCINE) 5000 UNIT/ML IJ SOLN
5000.0000 [IU] | Freq: Three times a day (TID) | INTRAMUSCULAR | Status: DC
Start: 2016-02-25 — End: 2016-02-25

## 2016-02-25 MED ORDER — SODIUM CHLORIDE 0.9 % IV SOLN: 250.0000 mL | INTRAVENOUS | Status: DC | PRN
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
## 2016-02-25 NOTE — Clinical Social Work Note (Signed)
CSW received consult for arranging outpatient dialysis. Discussed with EDP that this has been thoroughly attempted and pt was not accepted anywhere due to past issues at previous clinic.  Benay Pike, [place]
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
## 2016-02-25 NOTE — ED Triage Notes (Signed)
Pt comes in for dialysis. Last got dialysis last Thursday. Pt has swelling in abdomen and thighs. Denies any pain.

## 2016-02-25 NOTE — Discharge Summary (Signed)
Physician Discharge Summary  Sara [name] [name] DOB: 01-17-1981 DOA: 02/25/2016  PCP: Pcp Not In System  Admit date: 02/25/2016 Discharge date: 02/25/2016  Time spent: Less than 30 minutes  Recommendations for Outpatient Follow-up:  1. Recommend follow-up of the patient's serum potassium.     Discharge Diagnoses:  Principal Problem:   ESRD (end stage renal disease) on dialysis [facility]) Active Problems:   Hyperkalemia   Anemia in ESRD (end-stage renal disease) (HCC)   Schizoaffective disorder, bipolar type ([place])   Lupus nephritis ([place])   Essential hypertension   Tobacco abuse.   Discharge Condition: Stable  Diet recommendation: Heart healthy  Filed Weights   02/25/16 0730 02/25/16 1000  Weight: 68 kg (150 lb) 68 kg (149 lb 14.6 oz)    History of present illness:  Sara [name] is a 35 y.o. female with medical history significant for end-stage renal disease on hemodialysis hypertension, and schizoaffective disorder/bipolar disorder, who is well-known to the [facility] system. She had been barred from all other outpatient hemodialysis centers due to her behavior in the past, although her behavior has not been inappropriate during any of the previous hospitalizations at [facility].She presented to the ED for routine hemodialysis. She denied shortness of breath. She denied leg swelling. She denied black tarry stools or bright red blood per rectum. She denied hematemesis. She no longer has a menstrual period. In the ED, the patient was afebrile and hypertensive. Her lab data were significant for serum potassium of 6.1, BUN of 74, creatinine of 13.85, glucose of 134, phosphorus of 8.2, and hemoglobin of 8.3. She was admitted for hemodialysis and treatment of hyperkalemia via hemodialysis.Marland Kitchen  Hospital Course:  Patient was restarted on all of her chronic medications. She was encouraged to stop smoking cigarettes. Dr. difficulty was consulted for  administration of hemodialysis. Rather than treating her with Kayexalate or dextrose/insulin to treat the hyperkalemia, hemodialysis was used to treat it. Her phosphorus levels also noted to be elevated. She was continued on her binder. Productive [place]fe[place]u, the plan was to dialyze her for 4-1/2 hours and remove 4 L if her blood pressure remained greater than 90 systolically. He also restarted Epogen. The patient's blood pressure improved to 147/84 at the time of discharge. Follow-up laboratory studies were not ordered, but the patient plans to follow-up in the ED in 2 days for hemodialysis and blood work. She was discharged in improved condition.   Procedures:  Hemodialysis  Consultations:  Nephrology  Discharge Exam: Vitals:   02/25/16 1400 02/25/16 1430  BP: 132/79 147/84  Pulse: 93 98  Resp:    Temp:      General: Pleasant 35 year old African-American woman in no acute distress. Cardiovascular: S1, S2, with soft systolic murmur. Respiratory: Decreased breath sounds in the bases, otherwise clear. Breathing is nonlabored. Extremity: No peel edema.  Discharge Instructions    Discharge Medication List as of 02/25/2016  3:00 PM    CONTINUE these medications which have NOT CHANGED   Details  acetaminophen (TYLENOL) 500 MG tablet Take 1,000 mg by mouth every 6 (six) hours as needed for moderate pain. Reported on 06/12/2015, Until Discontinued, Historical Med    aspirin-acetaminophen-caffeine (EXCEDRIN MIGRAINE) 250-250-65 MG tablet Take 2 tablets by mouth every 6 (six) hours as needed for headache., Historical Med    metoprolol (LOPRESSOR) 50 MG tablet Take 1 tablet (50 mg total) by mouth 2 (two) times daily., Starting Mon 12/03/2015, Normal    predniSONE (DELTASONE) 20 MG tablet Take 1 tablet (  20 mg total) by mouth daily with breakfast., Starting Wed 11/28/2015, Normal    risperiDONE microspheres (RISPERDAL CONSTA) 25 MG injection Inject 25 mg into the muscle every 14 (fourteen)  days., Until Discontinued, Historical Med    sevelamer carbonate (RENVELA) 800 MG tablet Take 4 tablets (3,200 mg total) by mouth 3 (three) times daily with meals., Starting Mon 10/29/2015, Print    traMADol (ULTRAM) 50 MG tablet Take 1 tablet (50 mg total) by mouth every 6 (six) hours as needed for moderate pain., Starting Fri 12/21/2015, Print       Allergies  Allergen Reactions  . Ativan [Lorazepam] Swelling and Other (See Comments)    Dysarthria(patient has difficulty speaking and slurred speech); denies swelling, itching, pain, or numbness.  [name] [Ziprasidone Hcl] Itching and Swelling    Tongue swelling  . Keflex [Cephalexin] Swelling and Other (See Comments)    Tongue swelling. Can't talk   . Haldol [Haloperidol Lactate] Swelling    Tongue swelling. 05/31/15 - MD ok with giving as pt has tolerated in the past Pt can take benadryl.  . Other Itching    wool      The results of significant diagnostics from this hospitalization (including imaging, microbiology, ancillary and laboratory) are listed below for reference.    Significant Diagnostic Studies: Dg Chest 2 View  Result Date: 02/23/2016 CLINICAL DATA:  Cough and congestion for the past 4 days. History of end-stage renal disease, on dialysis EXAM: CHEST  2 VIEW COMPARISON:  02/20/2016; 10/29/2015; 07/02/2015 FINDINGS: Grossly unchanged enlarged cardiac silhouette and mediastinal contours. The pulmonary vasculature appears less distinct than present examination with cephalization of flow. There is minimal pleural parenchymal thickening about the bilateral major fissures as well as the right minor fissure. Worsening bibasilar opacities, favored to represent atelectasis. No discrete focal airspace opacities. No pleural effusion or pneumothorax. No acute osseus abnormalities. Chronic deformities involving the distal end of the bilateral clavicles. Old fracture involving the posterior aspect of the right third rib. IMPRESSION:  Cardiomegaly with findings worrisome for superimposed pulmonary edema. No pleural effusion. Electronically Signed   By: Sandi Mariscal M.D.   On: 02/23/2016 09:51   Dg Chest 2 View  Result Date: 02/15/2016 CLINICAL DATA:  Three days of cough, end-stage renal disease on dialysis and is currently in need of dialysis. History of lupus nephritis and end-stage renal disease. CHF EXAM: CHEST  2 VIEW COMPARISON:  PA and lateral chest x-ray of October 29, 2015 FINDINGS: The lungs are adequately inflated. The lung markings are coarse in the retrocardiac region on the left. The interstitial markings are increased bilaterally. There is minimal blunting of the costophrenic angles bilaterally. The cardiac silhouette is enlarged. The pulmonary vascularity is prominent centrally. There is calcification in the wall of the aortic arch. There is old deformity of the lateral aspect of the right 6 rib. IMPRESSION: CHF with mild interstitial edema and small bilateral pleural effusions. Early left lower lobe atelectasis is suspected as well. Aortic atherosclerosis. Electronically Signed   By: David  Martinique M.D.   On: 02/15/2016 08:15   Dg Chest Portable 1 View  Result Date: 02/20/2016 CLINICAL DATA:  Midchest pain tonight EXAM: PORTABLE CHEST 1 VIEW COMPARISON:  02/15/2016 FINDINGS: There is moderate unchanged cardiomegaly. The lungs are clear. There is mild costophrenic angle blunting which could represent tiny pleural effusions. Mediastinal and hilar contours are unremarkable and unchanged. Lysis of the distal clavicles bilaterally. IMPRESSION: Unchanged cardiomegaly.  Possible small effusions. Electronically Signed   By: Quillian Quince  Armandina Stammer M.D.   On: 02/20/2016 01:01    Microbiology: No results found for this or any previous visit (from the past 240 hour(s)).   Labs: Basic Metabolic Panel:  Recent Labs Lab 02/19/16 1057 02/20/16 0110 02/21/16 1813 02/23/16 0928 02/25/16 0901  NA 137 137 135 136 135  K 5.5* 4.8 4.0  5.0 6.1*  CL 97* 101 97* 99* 95*  CO2 26  --  24  --  24  GLUCOSE 105* 94 121* 128* 134*  BUN 64* 46* 51* 47* 74*  CREATININE 12.96* 11.50* 10.31* 11.90* 13.85*  CALCIUM 9.7  --  9.2  --  9.5  PHOS  --   --  6.4*  --  8.2*   Liver Function Tests:  Recent Labs Lab 02/21/16 1813 02/25/16 0901  ALBUMIN 3.2* 3.2*   No results for input(s): LIPASE, AMYLASE in the last 168 hours. No results for input(s): AMMONIA in the last 168 hours. CBC:  Recent Labs Lab 02/20/16 0110 02/21/16 1813 02/23/16 0928 02/25/16 0901  WBC  --  6.6  --  8.2  NEUTROABS  --   --   --  7.3  HGB 8.5* 7.9* 9.9* 8.3*  HCT 25.0* 25.1* 29.0* 26.6*  MCV  --  83.1  --  83.9  PLT  --  285  --  304   Cardiac Enzymes: No results for input(s): CKTOTAL, CKMB, CKMBINDEX, TROPONINI in the last 168 hours. BNP: BNP (last 3 results) No results for input(s): BNP in the last 8760 hours.  ProBNP (last 3 results) No results for input(s): PROBNP in the last 8760 hours.  CBG: No results for input(s): GLUCAP in the last 168 hours.     Signed:  Kyriakos Babler MD.  [facility] 02/25/2016, 5:07 PM

## 2016-02-26 ENCOUNTER — Other Ambulatory Visit: Payer: Self-pay

## 2016-02-26 NOTE — Patient Outreach (Signed)
[name]) Care Management  02/26/2016  Sara [name] 1980-10-18 [id number]   Telephone call to patient for screening call.  Number states invalid number after several tries.    Plan: RN Health Coach will send letter to attempt outreach.  If no response after ten business days will close case.    Jone Baseman, RN, MSN [employer] [phone]

## 2016-02-27 ENCOUNTER — Observation Stay (HOSPITAL_COMMUNITY)
Admission: EM | Admit: 2016-02-27 | Discharge: 2016-02-27 | Disposition: A | Discharge status: Home / Self Care | Payer: Medicare Other | Attending: Family Medicine | Admitting: Family Medicine

## 2016-02-27 ENCOUNTER — Encounter (HOSPITAL_COMMUNITY): Payer: Self-pay

## 2016-02-27 DIAGNOSIS — N186 End stage renal disease: Secondary | ICD-10-CM | POA: Diagnosis not present

## 2016-02-27 DIAGNOSIS — Z79899 Other long term (current) drug therapy: Secondary | ICD-10-CM | POA: Insufficient documentation

## 2016-02-27 DIAGNOSIS — F17210 Nicotine dependence, cigarettes, uncomplicated: Secondary | ICD-10-CM | POA: Insufficient documentation

## 2016-02-27 DIAGNOSIS — Z7982 Long term (current) use of aspirin: Secondary | ICD-10-CM | POA: Diagnosis not present

## 2016-02-27 DIAGNOSIS — Z9115 Patient's noncompliance with renal dialysis: Secondary | ICD-10-CM | POA: Diagnosis not present

## 2016-02-27 DIAGNOSIS — Z992 Dependence on renal dialysis: Secondary | ICD-10-CM | POA: Diagnosis not present

## 2016-02-27 DIAGNOSIS — I10 Essential (primary) hypertension: Secondary | ICD-10-CM | POA: Diagnosis not present

## 2016-02-27 DIAGNOSIS — I120 Hypertensive chronic kidney disease with stage 5 chronic kidney disease or end stage renal disease: Secondary | ICD-10-CM | POA: Diagnosis not present

## 2016-02-27 DIAGNOSIS — M899 Disorder of bone, unspecified: Secondary | ICD-10-CM | POA: Diagnosis not present

## 2016-02-27 DIAGNOSIS — I5022 Chronic systolic (congestive) heart failure: Secondary | ICD-10-CM | POA: Diagnosis not present

## 2016-02-27 DIAGNOSIS — M3214 Glomerular disease in systemic lupus erythematosus: Secondary | ICD-10-CM | POA: Insufficient documentation

## 2016-02-27 DIAGNOSIS — Z7952 Long term (current) use of systemic steroids: Secondary | ICD-10-CM | POA: Diagnosis not present

## 2016-02-27 DIAGNOSIS — Z9119 Patient's noncompliance with other medical treatment and regimen: Secondary | ICD-10-CM | POA: Insufficient documentation

## 2016-02-27 DIAGNOSIS — I132 Hypertensive heart and chronic kidney disease with heart failure and with stage 5 chronic kidney disease, or end stage renal disease: Secondary | ICD-10-CM | POA: Diagnosis not present

## 2016-02-27 DIAGNOSIS — F250 Schizoaffective disorder, bipolar type: Secondary | ICD-10-CM | POA: Insufficient documentation

## 2016-02-27 DIAGNOSIS — D631 Anemia in chronic kidney disease: Secondary | ICD-10-CM | POA: Diagnosis not present

## 2016-02-27 LAB — CBC
HCT: 25.6 % — ABNORMAL LOW (ref 36.0–46.0)
Hemoglobin: 7.9 g/dL — ABNORMAL LOW (ref 12.0–15.0)
MCH: 25.8 pg — ABNORMAL LOW (ref 26.0–34.0)
MCHC: 30.9 g/dL (ref 30.0–36.0)
MCV: 83.7 fL (ref 78.0–100.0)
Platelets: 283 10*3/uL (ref 150–400)
RBC: 3.06 MIL/uL — ABNORMAL LOW (ref 3.87–5.11)
RDW: 21.1 % — ABNORMAL HIGH (ref 11.5–15.5)
WBC: 8.1 10*3/uL (ref 4.0–10.5)

## 2016-02-27 LAB — RENAL FUNCTION PANEL
Albumin: 3.2 g/dL — ABNORMAL LOW (ref 3.5–5.0)
Anion gap: 11 (ref 5–15)
BUN: 36 mg/dL — ABNORMAL HIGH (ref 6–20)
CO2: 27 mmol/L (ref 22–32)
Calcium: 9.1 mg/dL (ref 8.9–10.3)
Chloride: 97 mmol/L — ABNORMAL LOW (ref 101–111)
Creatinine, Ser: 7.04 mg/dL — ABNORMAL HIGH (ref 0.44–1.00)
GFR calc Af Amer: 8 mL/min — ABNORMAL LOW (ref >60)
GFR calc non Af Amer: 7 mL/min — ABNORMAL LOW (ref >60)
Glucose, Bld: 109 mg/dL — ABNORMAL HIGH (ref 65–99)
Phosphorus: 4.7 mg/dL — ABNORMAL HIGH (ref 2.5–4.6)
Potassium: 3.9 mmol/L (ref 3.5–5.1)
Sodium: 135 mmol/L (ref 135–145)

## 2016-02-27 MED ORDER — HEPARIN SODIUM (PORCINE) 1000 UNIT/ML DIALYSIS: 1000.0000 [IU] | INTRAMUSCULAR | Status: DC | PRN

## 2016-02-27 MED ORDER — METOPROLOL TARTRATE 50 MG PO TABS
50.0000 mg | ORAL_TABLET | Freq: Two times a day (BID) | ORAL | Status: DC
  Administered 2016-02-27: 50 mg via ORAL
  Filled 2016-02-27: qty 1

## 2016-02-27 MED ORDER — DOXERCALCIFEROL 4 MCG/2ML IV SOLN
INTRAVENOUS | Status: AC
  Filled 2016-02-27: qty 2

## 2016-02-27 MED ORDER — HEPARIN SODIUM (PORCINE) 1000 UNIT/ML DIALYSIS: 20.0000 [IU]/kg | INTRAMUSCULAR | Status: DC | PRN

## 2016-02-27 MED ORDER — ACETAMINOPHEN 325 MG PO TABS: 650.0000 mg | ORAL_TABLET | Freq: Four times a day (QID) | ORAL | Status: DC | PRN

## 2016-02-27 MED ORDER — EPOETIN ALFA 10000 UNIT/ML IJ SOLN
6000.0000 [IU] | INTRAMUSCULAR | Status: DC
  Administered 2016-02-27: 6000 [IU] via SUBCUTANEOUS

## 2016-02-27 MED ORDER — SEVELAMER CARBONATE 800 MG PO TABS
ORAL_TABLET | ORAL | Status: AC
  Filled 2016-02-27: qty 4

## 2016-02-27 MED ORDER — LIDOCAINE HCL (PF) 1 % IJ SOLN: 5.0000 mL | INTRAMUSCULAR | Status: DC | PRN

## 2016-02-27 MED ORDER — TRAMADOL HCL 50 MG PO TABS: 50.0000 mg | ORAL_TABLET | Freq: Four times a day (QID) | ORAL | Status: DC | PRN

## 2016-02-27 MED ORDER — SODIUM CHLORIDE 0.9 % IV SOLN: 100.0000 mL | INTRAVENOUS | Status: DC | PRN

## 2016-02-27 MED ORDER — ASPIRIN-ACETAMINOPHEN-CAFFEINE 250-250-65 MG PO TABS
2.0000 | ORAL_TABLET | Freq: Four times a day (QID) | ORAL | Status: DC | PRN
  Filled 2016-02-27: qty 2

## 2016-02-27 MED ORDER — DOXERCALCIFEROL 4 MCG/2ML IV SOLN
4.0000 ug | INTRAVENOUS | Status: DC
  Administered 2016-02-27: 4 ug via INTRAVENOUS

## 2016-02-27 MED ORDER — ALTEPLASE 2 MG IJ SOLR: 2.0000 mg | Freq: Once | INTRAMUSCULAR | Status: DC | PRN

## 2016-02-27 MED ORDER — LIDOCAINE-PRILOCAINE 2.5-2.5 % EX CREA: 1.0000 "application " | TOPICAL_CREAM | CUTANEOUS | Status: DC | PRN

## 2016-02-27 MED ORDER — PREDNISONE 20 MG PO TABS: 20.0000 mg | ORAL_TABLET | Freq: Every day | ORAL | Status: DC

## 2016-02-27 MED ORDER — EPOETIN ALFA 10000 UNIT/ML IJ SOLN
INTRAMUSCULAR | Status: AC
  Filled 2016-02-27: qty 1

## 2016-02-27 MED ORDER — SEVELAMER CARBONATE 800 MG PO TABS
3200.0000 mg | ORAL_TABLET | Freq: Three times a day (TID) | ORAL | Status: DC
  Administered 2016-02-27: 3200 mg via ORAL

## 2016-02-27 MED ORDER — PENTAFLUOROPROP-TETRAFLUOROETH EX AERO: 1.0000 "application " | INHALATION_SPRAY | CUTANEOUS | Status: DC | PRN
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
## 2016-02-27 NOTE — ED Triage Notes (Signed)
Pt here for dialysis treatment.  Last treatment was Monday.

## 2016-02-27 NOTE — Consult Note (Addendum)
Sara [name] MRN: [id number] DOB/AGE: [birth year] 35 y.o. Primary Care Physician:Pcp Not In System Admit date: 02/27/2016 Chief Complaint:  Chief Complaint  Patient presents with  . dialysis   HPI: Pt is 35 year old female with past medical hx of ESRD who was admitted with c/o dyspnea," I need dialysis "  HPI dates 2 days ago as pt came to ER asking for her dialysis. Pt says " I need my dialysis ,My last dialysis was on monday ." No c/o fever/cough/chills NO c/o nausea/vomiting. NO c/o abdominal pain. No c/o hematuria . NO c/o syncope.    Past Medical History:  Diagnosis Date  . Acute myopericarditis    hx/notes 10/09/2014  . Bipolar disorder ([place])    [name] 10/09/2014  . CHF (congestive heart failure) ([place])    systolic/notes [id number]  . Chronic anemia    [name] 10/09/2014  . Current use of steroid medication 12/21/2015  . ESRD (end stage renal disease) on dialysis [facility])    "[name]; [facility]" (10/09/2014)  . History of blood transfusion    "this is probably my 3rd" (10/09/2014)  . Hypertension   . Low back pain   . Lupus    lupus w nephritis  . 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 Non-compliant patient   . Positive ANA (antinuclear antibody) 08/16/2012  . Positive Smith antibody 08/16/2012  . Pregnancy   . Psychosis   . Schizoaffective disorder, bipolar type ([place]) 11/20/2014  . Schizophrenia ([place])    [name] 10/09/2014  . Tobacco abuse 02/20/2014        Family History  Problem Relation Age of Onset  . Drug abuse Father   . Kidney disease Father     Social History:  reports that she has been smoking Cigarettes.  She has a 2.00 pack-year smoking history. She has never used smokeless tobacco. She reports that she does not drink alcohol or use drugs.   Allergies:  Allergies  Allergen Reactions  . Ativan [Lorazepam] Swelling and Other (See Comments)    Dysarthria(patient has difficulty speaking and slurred speech); denies swelling, itching, pain, or numbness.  [name]  [Ziprasidone Hcl] Itching and Swelling    Tongue swelling  . Keflex [Cephalexin] Swelling and Other (See Comments)    Tongue swelling. Can't talk   . Haldol [Haloperidol Lactate] Swelling    Tongue swelling. 05/31/15 - MD ok with giving as pt has tolerated in the past Pt can take benadryl.  . Other Itching    wool     (Not in a hospital admission)     [id number] from the symptoms mentioned above,there are no other symptoms referable to all systems reviewed.      Physical Exam: Vital signs in last 24 hours: Temp:  [98.2 F (36.8 C)] 98.2 F (36.8 C) (11/22 0728) Pulse Rate:  [60] 60 (11/22 0728) Resp:  [20] 20 (11/22 0728) BP: (171)/(118) 171/118 (11/22 0728) SpO2:  [100 %] 100 % (11/22 0728) Weight:  [149 lb (67.6 kg)] 149 lb (67.6 kg) (11/22 0727) Weight change:     Intake/Output from previous day: No intake/output data recorded. No intake/output data recorded.   Physical Exam: General- pt is awake,alert, follows comands Resp- No acute REsp distress, chest clear to asuculatation CVS- S1S2 regular in rate and rhythm, NO rubs  GIT- BS+, soft, NT, ND EXT- No LE Edema, NO Cyanosis CNS- CN 2-12 grossly intact. Moving all 4 extremities Access- AVF+    Lab Results:  CBC    Component Value Date/Time  WBC 8.2 02/25/2016 0901   RBC 3.17 (L) 02/25/2016 0901   HGB 8.3 (L) 02/25/2016 0901   HCT 26.6 (L) 02/25/2016 0901   PLT 304 02/25/2016 0901   MCV 83.9 02/25/2016 0901   MCH 26.2 02/25/2016 0901   MCHC 31.2 02/25/2016 0901   RDW 20.5 (H) 02/25/2016 0901   LYMPHSABS 0.7 02/25/2016 0901   MONOABS 0.2 02/25/2016 0901   EOSABS 0.0 02/25/2016 0901   BASOSABS 0.0 02/25/2016 0901      BMET BMP Latest Ref Rng & Units 02/25/2016 02/23/2016 02/21/2016  Glucose 65 - 99 mg/dL 134(H) 128(H) 121(H)  BUN 6 - 20 mg/dL 74(H) 47(H) 51(H)  Creatinine 0.44 - 1.00 mg/dL 13.85(H) 11.90(H) 10.31(H)  Sodium 135 - 145 mmol/L 135 136 135  Potassium 3.5 - 5.1 mmol/L 6.1(H)  5.0 4.0  Chloride 101 - 111 mmol/L 95(L) 99(L) 97(L)  CO2 22 - 32 mmol/L 24 - 24  Calcium 8.9 - 10.3 mg/dL 9.5 - 9.2         Lab Results  Component Value Date   PTH 747 (H) 02/15/2016   PTH Comment 02/15/2016   CALCIUM 9.5 02/25/2016   CAION 1.10 (L) 02/23/2016   PHOS 8.2 (H) 02/25/2016      Impression: 1)Renal  ESRD on HD                Pt is not on regular  Schedule sec to her complaince/adherence issues                Will dialyze pt today  2)HTN Bp is not at goal Hx of non adherence to medical tx  3)Anemia In ESRD the goal for HGb is 9--11. 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   Not at goal sec to non adherence to hd tx   Will keep on zemplar/Hectrol  Phosphorus not at goal.  on binders  5)Psych .  Hx of   psycosis Schizophrenia Primary MD following  6)Electrolytes  Hx of Hyperkalemia      Sec to non adherence to HD   Hx of Hyponatremia   7)Acid base Co2  at goal    Plan:  Will dialyze today Will use 2 k bath Will keep on epo-6000 units  Will try to take 2.5 liters off if possible Will keep on Hetrol to help with secondary hyperparathyroidosm  I again educated pt about need to adhere to medical tx.   Addendum Pt seen on HD. Pt tolerating tx well.   Dell Hurtubise S 02/27/2016, 9:17 AM

## 2016-02-29 ENCOUNTER — Encounter (HOSPITAL_COMMUNITY): Payer: Self-pay | Admitting: Emergency Medicine

## 2016-02-29 ENCOUNTER — Observation Stay (HOSPITAL_COMMUNITY)
Admission: EM | Admit: 2016-02-29 | Discharge: 2016-02-29 | Discharge status: Against Medical Advice | Payer: Medicare Other | Attending: Internal Medicine | Admitting: Internal Medicine

## 2016-02-29 DIAGNOSIS — I5022 Chronic systolic (congestive) heart failure: Secondary | ICD-10-CM | POA: Insufficient documentation

## 2016-02-29 DIAGNOSIS — D631 Anemia in chronic kidney disease: Secondary | ICD-10-CM | POA: Insufficient documentation

## 2016-02-29 DIAGNOSIS — F250 Schizoaffective disorder, bipolar type: Secondary | ICD-10-CM | POA: Diagnosis not present

## 2016-02-29 DIAGNOSIS — M3214 Glomerular disease in systemic lupus erythematosus: Secondary | ICD-10-CM | POA: Diagnosis not present

## 2016-02-29 DIAGNOSIS — Z79899 Other long term (current) drug therapy: Secondary | ICD-10-CM | POA: Diagnosis not present

## 2016-02-29 DIAGNOSIS — I132 Hypertensive heart and chronic kidney disease with heart failure and with stage 5 chronic kidney disease, or end stage renal disease: Principal | ICD-10-CM | POA: Insufficient documentation

## 2016-02-29 DIAGNOSIS — Z7982 Long term (current) use of aspirin: Secondary | ICD-10-CM | POA: Diagnosis not present

## 2016-02-29 DIAGNOSIS — I120 Hypertensive chronic kidney disease with stage 5 chronic kidney disease or end stage renal disease: Secondary | ICD-10-CM | POA: Diagnosis not present

## 2016-02-29 DIAGNOSIS — F17210 Nicotine dependence, cigarettes, uncomplicated: Secondary | ICD-10-CM | POA: Insufficient documentation

## 2016-02-29 DIAGNOSIS — Z9119 Patient's noncompliance with other medical treatment and regimen: Secondary | ICD-10-CM | POA: Insufficient documentation

## 2016-02-29 DIAGNOSIS — Z992 Dependence on renal dialysis: Secondary | ICD-10-CM | POA: Insufficient documentation

## 2016-02-29 DIAGNOSIS — N186 End stage renal disease: Secondary | ICD-10-CM | POA: Diagnosis not present

## 2016-02-29 DIAGNOSIS — Z7952 Long term (current) use of systemic steroids: Secondary | ICD-10-CM | POA: Diagnosis not present

## 2016-02-29 LAB — CBC
HCT: 25 % — ABNORMAL LOW (ref 36.0–46.0)
Hemoglobin: 7.7 g/dL — ABNORMAL LOW (ref 12.0–15.0)
MCH: 26.2 pg (ref 26.0–34.0)
MCHC: 30.8 g/dL (ref 30.0–36.0)
MCV: 85 fL (ref 78.0–100.0)
PLATELETS: 284 10*3/uL (ref 150–400)
RBC: 2.94 MIL/uL — ABNORMAL LOW (ref 3.87–5.11)
RDW: 21.4 % — AB (ref 11.5–15.5)
WBC: 9.9 10*3/uL (ref 4.0–10.5)

## 2016-02-29 LAB — FERRITIN: FERRITIN: 273 ng/mL (ref 11–307)

## 2016-02-29 LAB — RENAL FUNCTION PANEL
ALBUMIN: 2.9 g/dL — AB (ref 3.5–5.0)
Anion gap: 13 (ref 5–15)
BUN: 62 mg/dL — AB (ref 6–20)
CHLORIDE: 99 mmol/L — AB (ref 101–111)
CO2: 23 mmol/L (ref 22–32)
CREATININE: 9.46 mg/dL — AB (ref 0.44–1.00)
Calcium: 9 mg/dL (ref 8.9–10.3)
GFR calc Af Amer: 6 mL/min — ABNORMAL LOW (ref >60)
GFR, EST NON AFRICAN AMERICAN: 5 mL/min — AB (ref >60)
Glucose, Bld: 66 mg/dL (ref 65–99)
Phosphorus: 7 mg/dL — ABNORMAL HIGH (ref 2.5–4.6)
Potassium: 4.5 mmol/L (ref 3.5–5.1)
Sodium: 135 mmol/L (ref 135–145)

## 2016-02-29 MED ORDER — PENTAFLUOROPROP-TETRAFLUOROETH EX AERO: 1.0000 "application " | INHALATION_SPRAY | CUTANEOUS | Status: DC | PRN

## 2016-02-29 MED ORDER — TRAMADOL HCL 50 MG PO TABS
50.0000 mg | ORAL_TABLET | Freq: Four times a day (QID) | ORAL | Status: DC | PRN
  Administered 2016-02-29: 50 mg via ORAL

## 2016-02-29 MED ORDER — DIPHENHYDRAMINE HCL 25 MG PO CAPS
25.0000 mg | ORAL_CAPSULE | Freq: Four times a day (QID) | ORAL | Status: DC | PRN
  Administered 2016-02-29: 25 mg via ORAL

## 2016-02-29 MED ORDER — PREDNISONE 20 MG PO TABS: 20.0000 mg | ORAL_TABLET | Freq: Every day | ORAL | Status: DC

## 2016-02-29 MED ORDER — ASPIRIN 325 MG PO TABS: 650.0000 mg | ORAL_TABLET | Freq: Four times a day (QID) | ORAL | Status: DC | PRN

## 2016-02-29 MED ORDER — HEPARIN SODIUM (PORCINE) 1000 UNIT/ML DIALYSIS
20.0000 [IU]/kg | INTRAMUSCULAR | Status: DC | PRN
  Administered 2016-02-29: 1600 [IU] via INTRAVENOUS_CENTRAL
  Filled 2016-02-29: qty 2

## 2016-02-29 MED ORDER — METOPROLOL TARTRATE 50 MG PO TABS
50.0000 mg | ORAL_TABLET | Freq: Two times a day (BID) | ORAL | Status: DC
Start: 2016-02-29 — End: 2016-02-29

## 2016-02-29 MED ORDER — ASPIRIN-ACETAMINOPHEN-CAFFEINE 250-250-65 MG PO TABS: 2.0000 | ORAL_TABLET | Freq: Four times a day (QID) | ORAL | Status: DC | PRN

## 2016-02-29 MED ORDER — SEVELAMER CARBONATE 800 MG PO TABS
ORAL_TABLET | ORAL | Status: AC
  Administered 2016-02-29: 3200 mg via ORAL
  Filled 2016-02-29: qty 4

## 2016-02-29 MED ORDER — LIDOCAINE HCL (PF) 1 % IJ SOLN: 5.0000 mL | INTRAMUSCULAR | Status: DC | PRN

## 2016-02-29 MED ORDER — ACETAMINOPHEN 500 MG PO TABS: 500.0000 mg | ORAL_TABLET | Freq: Four times a day (QID) | ORAL | Status: DC | PRN

## 2016-02-29 MED ORDER — DIPHENHYDRAMINE HCL 25 MG PO CAPS
ORAL_CAPSULE | ORAL | Status: AC
  Administered 2016-02-29: 25 mg via ORAL
  Filled 2016-02-29: qty 1

## 2016-02-29 MED ORDER — EPOETIN ALFA 20000 UNIT/ML IJ SOLN
INTRAMUSCULAR | Status: AC
  Administered 2016-02-29: 14000 [IU] via SUBCUTANEOUS
  Filled 2016-02-29: qty 1

## 2016-02-29 MED ORDER — SEVELAMER CARBONATE 800 MG PO TABS
3200.0000 mg | ORAL_TABLET | Freq: Three times a day (TID) | ORAL | Status: DC
  Filled 2016-02-29 (×9): qty 4

## 2016-02-29 MED ORDER — RISPERIDONE MICROSPHERES 25 MG IM SUSR: 25.0000 mg | INTRAMUSCULAR | Status: DC

## 2016-02-29 MED ORDER — TRAMADOL HCL 50 MG PO TABS
ORAL_TABLET | ORAL | Status: AC
Start: 2016-02-29 — End: 2016-02-29
  Administered 2016-02-29: 50 mg via ORAL
  Filled 2016-02-29: qty 1

## 2016-02-29 MED ORDER — LIDOCAINE-PRILOCAINE 2.5-2.5 % EX CREA: 1.0000 "application " | TOPICAL_CREAM | CUTANEOUS | Status: DC | PRN

## 2016-02-29 MED ORDER — SODIUM CHLORIDE 0.9 % IV SOLN: 100.0000 mL | INTRAVENOUS | Status: DC | PRN

## 2016-02-29 MED ORDER — HEPARIN SODIUM (PORCINE) 1000 UNIT/ML IJ SOLN
INTRAMUSCULAR | Status: AC
  Administered 2016-02-29: 1600 [IU] via INTRAVENOUS_CENTRAL
  Filled 2016-02-29: qty 5

## 2016-02-29 MED ORDER — SEVELAMER CARBONATE 800 MG PO TABS
3200.0000 mg | ORAL_TABLET | Freq: Three times a day (TID) | ORAL | Status: DC
  Administered 2016-02-29: 3200 mg via ORAL
  Filled 2016-02-29 (×10): qty 4

## 2016-02-29 MED ORDER — ACETAMINOPHEN 500 MG PO TABS: 1000.0000 mg | ORAL_TABLET | Freq: Four times a day (QID) | ORAL | Status: DC | PRN

## 2016-02-29 MED ORDER — EPOETIN ALFA 20000 UNIT/ML IJ SOLN
14000.0000 [IU] | INTRAMUSCULAR | Status: DC
  Administered 2016-02-29: 14000 [IU] via SUBCUTANEOUS
  Filled 2016-02-29 (×2): qty 1
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
## 2016-02-29 NOTE — ED Notes (Signed)
Notified by dialysis nurses that patient left AMA after dialysis treatment.

## 2016-02-29 NOTE — ED Notes (Signed)
Levada Dy called stating she was here to give pt dialysis
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
## 2016-02-29 NOTE — ED Notes (Signed)
Levada Dy, Dialysis nurse en route to [place] to dialyze pt.  Levada Dy will call ED secretary upon arrival.

## 2016-02-29 NOTE — ED Triage Notes (Signed)
Pt here for dialysis, last tx on Wednesday, no other complaints.

## 2016-02-29 NOTE — ED Notes (Signed)
Pt sleeping at this time.

## 2016-02-29 NOTE — ED Notes (Signed)
Pt wanting a lunch tray, lunch tray ordered by Network engineer.

## 2016-03-01 LAB — PTH, INTACT AND CALCIUM
CALCIUM TOTAL (PTH): 9 mg/dL (ref 8.7–10.2)
PTH: 1021 pg/mL — AB (ref 15–65)

## 2016-03-01 LAB — HEPATITIS B SURFACE ANTIGEN: Hepatitis B Surface Ag: NEGATIVE

## 2016-03-03 ENCOUNTER — Observation Stay (HOSPITAL_COMMUNITY)
Admission: EM | Admit: 2016-03-03 | Discharge: 2016-03-03 | Disposition: A | Discharge status: Home / Self Care | Payer: Medicare Other | Attending: Internal Medicine | Admitting: Internal Medicine

## 2016-03-03 ENCOUNTER — Encounter (HOSPITAL_COMMUNITY): Payer: Self-pay | Admitting: Emergency Medicine

## 2016-03-03 DIAGNOSIS — Z9119 Patient's noncompliance with other medical treatment and regimen: Secondary | ICD-10-CM | POA: Insufficient documentation

## 2016-03-03 DIAGNOSIS — I5022 Chronic systolic (congestive) heart failure: Secondary | ICD-10-CM

## 2016-03-03 DIAGNOSIS — F250 Schizoaffective disorder, bipolar type: Secondary | ICD-10-CM | POA: Diagnosis not present

## 2016-03-03 DIAGNOSIS — F17210 Nicotine dependence, cigarettes, uncomplicated: Secondary | ICD-10-CM | POA: Insufficient documentation

## 2016-03-03 DIAGNOSIS — M3214 Glomerular disease in systemic lupus erythematosus: Secondary | ICD-10-CM | POA: Diagnosis not present

## 2016-03-03 DIAGNOSIS — N189 Chronic kidney disease, unspecified: Secondary | ICD-10-CM | POA: Diagnosis present

## 2016-03-03 DIAGNOSIS — Z992 Dependence on renal dialysis: Secondary | ICD-10-CM | POA: Diagnosis not present

## 2016-03-03 DIAGNOSIS — D631 Anemia in chronic kidney disease: Secondary | ICD-10-CM | POA: Insufficient documentation

## 2016-03-03 DIAGNOSIS — Z7952 Long term (current) use of systemic steroids: Secondary | ICD-10-CM

## 2016-03-03 DIAGNOSIS — N186 End stage renal disease: Secondary | ICD-10-CM | POA: Diagnosis not present

## 2016-03-03 DIAGNOSIS — I10 Essential (primary) hypertension: Secondary | ICD-10-CM | POA: Diagnosis present

## 2016-03-03 DIAGNOSIS — Z79899 Other long term (current) drug therapy: Secondary | ICD-10-CM | POA: Insufficient documentation

## 2016-03-03 DIAGNOSIS — Z7982 Long term (current) use of aspirin: Secondary | ICD-10-CM | POA: Diagnosis not present

## 2016-03-03 DIAGNOSIS — I132 Hypertensive heart and chronic kidney disease with heart failure and with stage 5 chronic kidney disease, or end stage renal disease: Secondary | ICD-10-CM | POA: Diagnosis not present

## 2016-03-03 DIAGNOSIS — I120 Hypertensive chronic kidney disease with stage 5 chronic kidney disease or end stage renal disease: Secondary | ICD-10-CM | POA: Diagnosis not present

## 2016-03-03 DIAGNOSIS — M898X9 Other specified disorders of bone, unspecified site: Secondary | ICD-10-CM | POA: Insufficient documentation

## 2016-03-03 LAB — RENAL FUNCTION PANEL
Albumin: 3.2 g/dL — ABNORMAL LOW (ref 3.5–5.0)
Anion gap: 16 — ABNORMAL HIGH (ref 5–15)
BUN: 74 mg/dL — AB (ref 6–20)
CALCIUM: 9.2 mg/dL (ref 8.9–10.3)
CHLORIDE: 96 mmol/L — AB (ref 101–111)
CO2: 21 mmol/L — AB (ref 22–32)
CREATININE: 10.51 mg/dL — AB (ref 0.44–1.00)
GFR calc non Af Amer: 4 mL/min — ABNORMAL LOW (ref >60)
GFR, EST AFRICAN AMERICAN: 5 mL/min — AB (ref >60)
Glucose, Bld: 101 mg/dL — ABNORMAL HIGH (ref 65–99)
Phosphorus: 8.6 mg/dL — ABNORMAL HIGH (ref 2.5–4.6)
Potassium: 5 mmol/L (ref 3.5–5.1)
SODIUM: 133 mmol/L — AB (ref 135–145)

## 2016-03-03 LAB — CBC
HCT: 25.4 % — ABNORMAL LOW (ref 36.0–46.0)
Hemoglobin: 7.9 g/dL — ABNORMAL LOW (ref 12.0–15.0)
MCH: 26.2 pg (ref 26.0–34.0)
MCHC: 31.1 g/dL (ref 30.0–36.0)
MCV: 84.1 fL (ref 78.0–100.0)
PLATELETS: 270 10*3/uL (ref 150–400)
RBC: 3.02 MIL/uL — AB (ref 3.87–5.11)
RDW: 21.2 % — AB (ref 11.5–15.5)
WBC: 8.8 10*3/uL (ref 4.0–10.5)

## 2016-03-03 MED ORDER — SODIUM CHLORIDE 0.9 % IV SOLN
125.0000 mg | Freq: Once | INTRAVENOUS | Status: AC
  Administered 2016-03-03: 125 mg via INTRAVENOUS
  Filled 2016-03-03: qty 10

## 2016-03-03 MED ORDER — EPOETIN ALFA 20000 UNIT/ML IJ SOLN
14000.0000 [IU] | Freq: Once | INTRAMUSCULAR | Status: AC
  Administered 2016-03-03: 14000 [IU] via SUBCUTANEOUS
  Filled 2016-03-03: qty 1

## 2016-03-03 MED ORDER — DIPHENHYDRAMINE HCL 25 MG PO CAPS: 25.0000 mg | ORAL_CAPSULE | Freq: Four times a day (QID) | ORAL | Status: DC | PRN

## 2016-03-03 MED ORDER — HEPARIN SODIUM (PORCINE) 1000 UNIT/ML DIALYSIS
1000.0000 [IU] | INTRAMUSCULAR | Status: DC | PRN
  Filled 2016-03-03: qty 1

## 2016-03-03 MED ORDER — SODIUM CHLORIDE 0.9 % IV SOLN: 100.0000 mL | INTRAVENOUS | Status: DC | PRN

## 2016-03-03 MED ORDER — PENTAFLUOROPROP-TETRAFLUOROETH EX AERO: 1.0000 "application " | INHALATION_SPRAY | CUTANEOUS | Status: DC | PRN

## 2016-03-03 MED ORDER — HEPARIN SODIUM (PORCINE) 1000 UNIT/ML DIALYSIS
20.0000 [IU]/kg | INTRAMUSCULAR | Status: DC | PRN
  Filled 2016-03-03: qty 2

## 2016-03-03 MED ORDER — SEVELAMER CARBONATE 800 MG PO TABS
3200.0000 mg | ORAL_TABLET | Freq: Three times a day (TID) | ORAL | Status: DC
  Administered 2016-03-03: 3200 mg via ORAL
  Filled 2016-03-03 (×7): qty 4

## 2016-03-03 MED ORDER — LIDOCAINE HCL (PF) 1 % IJ SOLN: 5.0000 mL | INTRAMUSCULAR | Status: DC | PRN

## 2016-03-03 MED ORDER — DOXERCALCIFEROL 4 MCG/2ML IV SOLN
5.0000 ug | INTRAVENOUS | Status: DC
  Administered 2016-03-03: 5 ug via INTRAVENOUS
  Filled 2016-03-03: qty 4

## 2016-03-03 MED ORDER — LIDOCAINE-PRILOCAINE 2.5-2.5 % EX CREA: 1.0000 "application " | TOPICAL_CREAM | CUTANEOUS | Status: DC | PRN

## 2016-03-03 MED ORDER — ALTEPLASE 2 MG IJ SOLR
2.0000 mg | Freq: Once | INTRAMUSCULAR | Status: DC | PRN
  Filled 2016-03-03: qty 2

## 2016-03-03 MED ORDER — SODIUM CHLORIDE 0.9 % IV SOLN: 25.0000 mg | Freq: Once | INTRAVENOUS | Status: DC

## 2016-03-03 NOTE — ED Triage Notes (Signed)
Patient here for dialysis. States last dialysis was Friday. Denies symptoms.
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
## 2016-03-03 NOTE — ED Notes (Signed)
Dialysis RN waiting for orders. nephrologist repaged for orders.

## 2016-03-05 ENCOUNTER — Observation Stay (HOSPITAL_COMMUNITY)
Admission: EM | Admit: 2016-03-05 | Discharge: 2016-03-05 | Disposition: A | Discharge status: Home / Self Care | Payer: Medicare Other | Attending: Family Medicine | Admitting: Family Medicine

## 2016-03-05 ENCOUNTER — Encounter (HOSPITAL_COMMUNITY): Payer: Self-pay | Admitting: *Deleted

## 2016-03-05 ENCOUNTER — Emergency Department (HOSPITAL_COMMUNITY)
Admission: EM | Admit: 2016-03-05 | Discharge: 2016-03-05 | Disposition: A | Discharge status: Home / Self Care | Payer: Medicare Other | Attending: Emergency Medicine | Admitting: Emergency Medicine

## 2016-03-05 DIAGNOSIS — Z992 Dependence on renal dialysis: Secondary | ICD-10-CM

## 2016-03-05 DIAGNOSIS — Z79899 Other long term (current) drug therapy: Secondary | ICD-10-CM

## 2016-03-05 DIAGNOSIS — D631 Anemia in chronic kidney disease: Secondary | ICD-10-CM | POA: Diagnosis not present

## 2016-03-05 DIAGNOSIS — I10 Essential (primary) hypertension: Secondary | ICD-10-CM | POA: Diagnosis not present

## 2016-03-05 DIAGNOSIS — I132 Hypertensive heart and chronic kidney disease with heart failure and with stage 5 chronic kidney disease, or end stage renal disease: Secondary | ICD-10-CM

## 2016-03-05 DIAGNOSIS — I120 Hypertensive chronic kidney disease with stage 5 chronic kidney disease or end stage renal disease: Secondary | ICD-10-CM | POA: Diagnosis not present

## 2016-03-05 DIAGNOSIS — I5022 Chronic systolic (congestive) heart failure: Secondary | ICD-10-CM | POA: Diagnosis not present

## 2016-03-05 DIAGNOSIS — I509 Heart failure, unspecified: Secondary | ICD-10-CM

## 2016-03-05 DIAGNOSIS — Z5321 Procedure and treatment not carried out due to patient leaving prior to being seen by health care provider: Secondary | ICD-10-CM | POA: Insufficient documentation

## 2016-03-05 DIAGNOSIS — N186 End stage renal disease: Secondary | ICD-10-CM

## 2016-03-05 DIAGNOSIS — F17210 Nicotine dependence, cigarettes, uncomplicated: Secondary | ICD-10-CM | POA: Insufficient documentation

## 2016-03-05 DIAGNOSIS — Z7982 Long term (current) use of aspirin: Secondary | ICD-10-CM | POA: Insufficient documentation

## 2016-03-05 LAB — RENAL FUNCTION PANEL
Albumin: 3.1 g/dL — ABNORMAL LOW (ref 3.5–5.0)
Anion gap: 10 (ref 5–15)
BUN: 46 mg/dL — ABNORMAL HIGH (ref 6–20)
CO2: 27 mmol/L (ref 22–32)
Calcium: 8.7 mg/dL — ABNORMAL LOW (ref 8.9–10.3)
Chloride: 95 mmol/L — ABNORMAL LOW (ref 101–111)
Creatinine, Ser: 6.84 mg/dL — ABNORMAL HIGH (ref 0.44–1.00)
GFR calc Af Amer: 8 mL/min — ABNORMAL LOW (ref >60)
GFR calc non Af Amer: 7 mL/min — ABNORMAL LOW (ref >60)
Glucose, Bld: 84 mg/dL (ref 65–99)
Phosphorus: 4.9 mg/dL — ABNORMAL HIGH (ref 2.5–4.6)
Potassium: 3.9 mmol/L (ref 3.5–5.1)
Sodium: 132 mmol/L — ABNORMAL LOW (ref 135–145)

## 2016-03-05 LAB — CBC
HCT: 25.6 % — ABNORMAL LOW (ref 36.0–46.0)
Hemoglobin: 8.1 g/dL — ABNORMAL LOW (ref 12.0–15.0)
MCH: 26.6 pg (ref 26.0–34.0)
MCHC: 31.6 g/dL (ref 30.0–36.0)
MCV: 84.2 fL (ref 78.0–100.0)
Platelets: 245 10*3/uL (ref 150–400)
RBC: 3.04 MIL/uL — ABNORMAL LOW (ref 3.87–5.11)
RDW: 21.4 % — ABNORMAL HIGH (ref 11.5–15.5)
WBC: 9.9 10*3/uL (ref 4.0–10.5)

## 2016-03-05 MED ORDER — PENTAFLUOROPROP-TETRAFLUOROETH EX AERO: 1.0000 "application " | INHALATION_SPRAY | CUTANEOUS | Status: DC | PRN

## 2016-03-05 MED ORDER — ACETAMINOPHEN 500 MG PO TABS: 1000.0000 mg | ORAL_TABLET | Freq: Four times a day (QID) | ORAL | Status: DC | PRN

## 2016-03-05 MED ORDER — EPOETIN ALFA 4000 UNIT/ML IJ SOLN
INTRAMUSCULAR | Status: AC
  Filled 2016-03-05: qty 1

## 2016-03-05 MED ORDER — SODIUM CHLORIDE 0.9% FLUSH: 3.0000 mL | INTRAVENOUS | Status: DC | PRN

## 2016-03-05 MED ORDER — TRAMADOL HCL 50 MG PO TABS: 50.0000 mg | ORAL_TABLET | Freq: Four times a day (QID) | ORAL | Status: DC | PRN

## 2016-03-05 MED ORDER — SEVELAMER CARBONATE 800 MG PO TABS
3200.0000 mg | ORAL_TABLET | Freq: Three times a day (TID) | ORAL | Status: DC
  Administered 2016-03-05: 3200 mg via ORAL

## 2016-03-05 MED ORDER — LIDOCAINE HCL (PF) 1 % IJ SOLN: 5.0000 mL | INTRAMUSCULAR | Status: DC | PRN

## 2016-03-05 MED ORDER — PREDNISONE 20 MG PO TABS
20.0000 mg | ORAL_TABLET | Freq: Once | ORAL | Status: AC
  Administered 2016-03-05: 20 mg via ORAL
  Filled 2016-03-05: qty 1

## 2016-03-05 MED ORDER — SODIUM CHLORIDE 0.9 % IV SOLN: 250.0000 mL | INTRAVENOUS | Status: DC | PRN

## 2016-03-05 MED ORDER — ALTEPLASE 2 MG IJ SOLR: 2.0000 mg | Freq: Once | INTRAMUSCULAR | Status: DC | PRN

## 2016-03-05 MED ORDER — EPOETIN ALFA 4000 UNIT/ML IJ SOLN
4000.0000 [IU] | Freq: Once | INTRAMUSCULAR | Status: AC
  Administered 2016-03-05: 4000 [IU] via SUBCUTANEOUS

## 2016-03-05 MED ORDER — SODIUM CHLORIDE 0.9 % IV SOLN: 100.0000 mL | INTRAVENOUS | Status: DC | PRN

## 2016-03-05 MED ORDER — HEPARIN SODIUM (PORCINE) 1000 UNIT/ML DIALYSIS: 1000.0000 [IU] | INTRAMUSCULAR | Status: DC | PRN

## 2016-03-05 MED ORDER — HEPARIN SODIUM (PORCINE) 5000 UNIT/ML IJ SOLN: 5000.0000 [IU] | Freq: Three times a day (TID) | INTRAMUSCULAR | Status: DC

## 2016-03-05 MED ORDER — EPOETIN ALFA 4000 UNIT/ML IJ SOLN
4000.0000 [IU] | INTRAMUSCULAR | Status: DC
Start: 2016-03-07 — End: 2016-03-05
  Filled 2016-03-05: qty 1

## 2016-03-05 MED ORDER — ASPIRIN-ACETAMINOPHEN-CAFFEINE 250-250-65 MG PO TABS: 2.0000 | ORAL_TABLET | Freq: Four times a day (QID) | ORAL | Status: DC | PRN

## 2016-03-05 MED ORDER — SODIUM CHLORIDE 0.9% FLUSH: 3.0000 mL | Freq: Two times a day (BID) | INTRAVENOUS | Status: DC

## 2016-03-05 MED ORDER — PREDNISONE 20 MG PO TABS: 20.0000 mg | ORAL_TABLET | Freq: Every day | ORAL | Status: DC

## 2016-03-05 MED ORDER — SEVELAMER CARBONATE 800 MG PO TABS
ORAL_TABLET | ORAL | Status: AC
  Administered 2016-03-05: 3200 mg via ORAL
  Filled 2016-03-05: qty 4

## 2016-03-05 MED ORDER — METOPROLOL TARTRATE 50 MG PO TABS
50.0000 mg | ORAL_TABLET | Freq: Two times a day (BID) | ORAL | Status: DC
  Administered 2016-03-05: 50 mg via ORAL
  Filled 2016-03-05: qty 1

## 2016-03-05 MED ORDER — LIDOCAINE-PRILOCAINE 2.5-2.5 % EX CREA: 1.0000 "application " | TOPICAL_CREAM | CUTANEOUS | Status: DC | PRN

## 2016-03-05 MED ORDER — PREDNISONE 20 MG PO TABS: 20.0000 mg | ORAL_TABLET | Freq: Once | ORAL | Status: DC

## 2016-03-05 MED ORDER — HEPARIN SODIUM (PORCINE) 1000 UNIT/ML DIALYSIS: 20.0000 [IU]/kg | INTRAMUSCULAR | Status: DC | PRN

## 2016-03-05 NOTE — ED Notes (Signed)
Dialysis Nurse Caren Griffins) called to see if pt could come to room 208 now, pt has orders for treatment.  Informed Dr Thurnell Garbe, ok to send pt to dialysis.

## 2016-03-05 NOTE — ED Triage Notes (Addendum)
Registration observed pt walking out of the waiting room and getting into a vehicle leaving approximately 10 minutes ago. RN was notified. Dr. Lacinda Axon notified.

## 2016-03-05 NOTE — Consult Note (Signed)
EASTON GALES MRN: [id number] DOB/AGE: 34/24/1982 34 y.o. Primary Care Physician:Pcp Not In System Admit date: 03/05/2016 Chief Complaint:  Chief Complaint  Patient presents with  . Needs dialysis   HPI: Pt is 35 year old female with past medical hx of ESRD who was admitted with c/o dyspnea," I need dialysis "  HPI dates 2 days ago as pt came to ER asking for her dialysis. Pt says " I need my dialysis ,My last dialysis was on monday ,I have gained 30 lbs." Pt says " I will not sign off early today "  No c/o fever/cough/chills NO c/o nausea/vomiting. NO c/o abdominal pain. No c/o hematuria . NO c/o syncope.    Past Medical History:  Diagnosis Date  . Acute myopericarditis    hx/notes 10/09/2014  . Bipolar disorder ([place])    [name] 10/09/2014  . CHF (congestive heart failure) ([place])    systolic/notes [id number]  . Chronic anemia    [name] 10/09/2014  . Current use of steroid medication 12/21/2015  . ESRD (end stage renal disease) on dialysis [facility])    "[name]; [facility]" (10/09/2014)  . History of blood transfusion    "this is probably my 3rd" (10/09/2014)  . Hypertension   . Low back pain   . Lupus    lupus w nephritis  . 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 Non-compliant patient   . Positive ANA (antinuclear antibody) 08/16/2012  . Positive Smith antibody 08/16/2012  . Pregnancy   . Psychosis   . Schizoaffective disorder, bipolar type ([place]) 11/20/2014  . Schizophrenia ([place])    [name] 10/09/2014  . Tobacco abuse 02/20/2014        Family History  Problem Relation Age of Onset  . Drug abuse Father   . Kidney disease Father     Social History:  reports that she has been smoking Cigarettes.  She has a 2.00 pack-year smoking history. She has never used smokeless tobacco. She reports that she does not drink alcohol or use drugs.   Allergies:  Allergies  Allergen Reactions  . Ativan [Lorazepam] Swelling and Other (See Comments)    Dysarthria(patient has difficulty speaking and  slurred speech); denies swelling, itching, pain, or numbness.  [name] [Ziprasidone Hcl] Itching and Swelling    Tongue swelling  . Keflex [Cephalexin] Swelling and Other (See Comments)    Tongue swelling. Can't talk   . Haldol [Haloperidol Lactate] Swelling    Tongue swelling. 05/31/15 - MD ok with giving as pt has tolerated in the past Pt can take benadryl.  . Other Itching    wool     (Not in a hospital admission)     [id number] from the symptoms mentioned above,there are no other symptoms referable to all systems reviewed.      Physical Exam: Vital signs in last 24 hours: Temp:  [97.8 F (36.6 C)-98.4 F (36.9 C)] 98.4 F (36.9 C) (11/29 1425) Pulse Rate:  [116-118] 118 (11/29 1425) Resp:  [18] 18 (11/29 1425) BP: (124-134)/(91-97) 124/91 (11/29 1425) SpO2:  [97 %-100 %] 97 % (11/29 1425) Weight:  [180 lb (81.6 kg)-180 lb 4.8 oz (81.8 kg)] 180 lb (81.6 kg) (11/29 1426) Weight change:     Intake/Output from previous day: No intake/output data recorded. No intake/output data recorded.   Physical Exam: General- pt is awake,alert, follows comands Resp- No acute REsp distress, decreased at bases CVS- S1S2 regular in rate and rhythm, NO rubs  GIT- BS+, soft, NT, ND EXT- 1+ LE Edema, NO  Cyanosis CNS- CN 2-12 grossly intact. Moving all 4 extremities Access- AVF+    Lab Results:  CBC    Component Value Date/Time   WBC 8.8 03/03/2016 1516   RBC 3.02 (L) 03/03/2016 1516   HGB 7.9 (L) 03/03/2016 1516   HCT 25.4 (L) 03/03/2016 1516   PLT 270 03/03/2016 1516   MCV 84.1 03/03/2016 1516   MCH 26.2 03/03/2016 1516   MCHC 31.1 03/03/2016 1516   RDW 21.2 (H) 03/03/2016 1516   LYMPHSABS 0.7 02/25/2016 0901   MONOABS 0.2 02/25/2016 0901   EOSABS 0.0 02/25/2016 0901   BASOSABS 0.0 02/25/2016 0901      BMET BMP Latest Ref Rng & Units 03/03/2016 02/29/2016 02/29/2016  Glucose 65 - 99 mg/dL 101(H) 66 -  BUN 6 - 20 mg/dL 74(H) 62(H) -  Creatinine 0.44 - 1.00  mg/dL 10.51(H) 9.46(H) -  Sodium 135 - 145 mmol/L 133(L) 135 -  Potassium 3.5 - 5.1 mmol/L 5.0 4.5 -  Chloride 101 - 111 mmol/L 96(L) 99(L) -  CO2 22 - 32 mmol/L 21(L) 23 -  Calcium 8.9 - 10.3 mg/dL 9.2 9.0 9.0         Lab Results  Component Value Date   PTH 1,021 (H) 02/29/2016   PTH Comment 02/29/2016   CALCIUM 9.2 03/03/2016   CAION 1.10 (L) 02/23/2016   PHOS 8.6 (H) 03/03/2016      Impression: 1)Renal  ESRD on HD                Pt is not on regular  Schedule sec to her complaince/adherence issues                Will dialyze pt today  2)HTN Bp is not at goal Hx of non adherence to medical tx  3)Anemia In ESRD the goal for HGb is 9--11. 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   Not at goal sec to non adherence to hd tx   Phosphorus not at goal.  on binders  5)Psych .  Hx of   psycosis Schizophrenia Primary MD following  6)Electrolytes  Hx of Hyperkalemia      Sec to non adherence to HD   Hx of Hyponatremia   7)Acid base Co2  at goal    Plan:  Will dialyze today Will use 2 k bath Will keep on epo-4000 units  will dialyze for 4.5 hrs ( if pt stays )  Will try to take 4 liters off if possible  I again educated pt about need to adhere to medical tx.   Addendum Pt seen on HD. Pt tolerating tx well.   BHUTANI,MANPREET S 03/05/2016, 2:50 PM

## 2016-03-05 NOTE — ED Notes (Signed)
Transported to room 208.
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
## 2016-03-05 NOTE — ED Triage Notes (Signed)
Pt here requesting dialysis. Last dialysis on Monday 11/27 but didn't receive full treatment due to time restraints per pt. Pt reports 20lb weight increase since Monday. Also c/o bilateral thigh pain.
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
## 2016-03-05 NOTE — ED Triage Notes (Signed)
Pt here for second time today after leaving earlier requesting dialylsis. Last dialysis on Monday 03/03/16 but didn't receive full treatment due to time restraints per pt. Pt reports 20lb weight increase since Monday. Also c/o bilateral thigh pain.

## 2016-03-07 ENCOUNTER — Observation Stay (HOSPITAL_COMMUNITY)
Admission: EM | Admit: 2016-03-07 | Discharge: 2016-03-07 | Discharge status: Against Medical Advice | Payer: Medicare Other | Attending: Family Medicine | Admitting: Family Medicine

## 2016-03-07 ENCOUNTER — Encounter (HOSPITAL_COMMUNITY): Payer: Self-pay | Admitting: Emergency Medicine

## 2016-03-07 DIAGNOSIS — Z7982 Long term (current) use of aspirin: Secondary | ICD-10-CM | POA: Insufficient documentation

## 2016-03-07 DIAGNOSIS — N250 Renal osteodystrophy: Secondary | ICD-10-CM | POA: Diagnosis not present

## 2016-03-07 DIAGNOSIS — Z992 Dependence on renal dialysis: Secondary | ICD-10-CM | POA: Insufficient documentation

## 2016-03-07 DIAGNOSIS — Z79899 Other long term (current) drug therapy: Secondary | ICD-10-CM | POA: Diagnosis not present

## 2016-03-07 DIAGNOSIS — N186 End stage renal disease: Secondary | ICD-10-CM | POA: Diagnosis not present

## 2016-03-07 DIAGNOSIS — M3214 Glomerular disease in systemic lupus erythematosus: Secondary | ICD-10-CM | POA: Diagnosis present

## 2016-03-07 DIAGNOSIS — Z9119 Patient's noncompliance with other medical treatment and regimen: Secondary | ICD-10-CM | POA: Insufficient documentation

## 2016-03-07 DIAGNOSIS — F17210 Nicotine dependence, cigarettes, uncomplicated: Secondary | ICD-10-CM | POA: Diagnosis not present

## 2016-03-07 DIAGNOSIS — D638 Anemia in other chronic diseases classified elsewhere: Secondary | ICD-10-CM | POA: Diagnosis not present

## 2016-03-07 DIAGNOSIS — Z7952 Long term (current) use of systemic steroids: Secondary | ICD-10-CM | POA: Diagnosis not present

## 2016-03-07 DIAGNOSIS — D631 Anemia in chronic kidney disease: Secondary | ICD-10-CM | POA: Diagnosis not present

## 2016-03-07 DIAGNOSIS — I5022 Chronic systolic (congestive) heart failure: Secondary | ICD-10-CM | POA: Insufficient documentation

## 2016-03-07 DIAGNOSIS — F250 Schizoaffective disorder, bipolar type: Secondary | ICD-10-CM | POA: Diagnosis present

## 2016-03-07 DIAGNOSIS — I120 Hypertensive chronic kidney disease with stage 5 chronic kidney disease or end stage renal disease: Secondary | ICD-10-CM | POA: Diagnosis not present

## 2016-03-07 DIAGNOSIS — I132 Hypertensive heart and chronic kidney disease with heart failure and with stage 5 chronic kidney disease, or end stage renal disease: Secondary | ICD-10-CM | POA: Diagnosis not present

## 2016-03-07 LAB — CBC
HEMATOCRIT: 25.2 % — AB (ref 36.0–46.0)
Hemoglobin: 7.9 g/dL — ABNORMAL LOW (ref 12.0–15.0)
MCH: 26.3 pg (ref 26.0–34.0)
MCHC: 31.3 g/dL (ref 30.0–36.0)
MCV: 84 fL (ref 78.0–100.0)
PLATELETS: 264 10*3/uL (ref 150–400)
RBC: 3 MIL/uL — ABNORMAL LOW (ref 3.87–5.11)
RDW: 21.4 % — AB (ref 11.5–15.5)
WBC: 8.6 10*3/uL (ref 4.0–10.5)

## 2016-03-07 LAB — RENAL FUNCTION PANEL
ALBUMIN: 3 g/dL — AB (ref 3.5–5.0)
Anion gap: 14 (ref 5–15)
BUN: 70 mg/dL — AB (ref 6–20)
CO2: 24 mmol/L (ref 22–32)
CREATININE: 9.77 mg/dL — AB (ref 0.44–1.00)
Calcium: 9 mg/dL (ref 8.9–10.3)
Chloride: 94 mmol/L — ABNORMAL LOW (ref 101–111)
GFR calc Af Amer: 5 mL/min — ABNORMAL LOW (ref >60)
GFR, EST NON AFRICAN AMERICAN: 5 mL/min — AB (ref >60)
GLUCOSE: 112 mg/dL — AB (ref 65–99)
PHOSPHORUS: 8.2 mg/dL — AB (ref 2.5–4.6)
POTASSIUM: 5.3 mmol/L — AB (ref 3.5–5.1)
SODIUM: 132 mmol/L — AB (ref 135–145)

## 2016-03-07 MED ORDER — SODIUM CHLORIDE 0.9 % IV SOLN
125.0000 mg | Freq: Once | INTRAVENOUS | Status: AC
  Administered 2016-03-07: 125 mg via INTRAVENOUS
  Filled 2016-03-07: qty 10

## 2016-03-07 MED ORDER — PREDNISONE 20 MG PO TABS: 20.0000 mg | ORAL_TABLET | Freq: Every day | ORAL | Status: DC

## 2016-03-07 MED ORDER — SODIUM CHLORIDE 0.9 % IV SOLN: 100.0000 mL | INTRAVENOUS | Status: DC | PRN

## 2016-03-07 MED ORDER — DOXERCALCIFEROL 4 MCG/2ML IV SOLN
5.0000 ug | INTRAVENOUS | Status: DC
  Administered 2016-03-07: 5 ug via INTRAVENOUS
  Filled 2016-03-07: qty 4

## 2016-03-07 MED ORDER — PENTAFLUOROPROP-TETRAFLUOROETH EX AERO: 1.0000 "application " | INHALATION_SPRAY | CUTANEOUS | Status: DC | PRN

## 2016-03-07 MED ORDER — ACETAMINOPHEN 500 MG PO TABS: 1000.0000 mg | ORAL_TABLET | Freq: Four times a day (QID) | ORAL | Status: DC | PRN

## 2016-03-07 MED ORDER — HEPARIN SODIUM (PORCINE) 5000 UNIT/ML IJ SOLN: 5000.0000 [IU] | Freq: Three times a day (TID) | INTRAMUSCULAR | Status: DC

## 2016-03-07 MED ORDER — DIPHENHYDRAMINE HCL 25 MG PO CAPS
25.0000 mg | ORAL_CAPSULE | Freq: Four times a day (QID) | ORAL | Status: DC | PRN
  Administered 2016-03-07: 25 mg via ORAL

## 2016-03-07 MED ORDER — EPOETIN ALFA 20000 UNIT/ML IJ SOLN
INTRAMUSCULAR | Status: AC
  Administered 2016-03-07: 14000 [IU] via INTRAVENOUS
  Filled 2016-03-07: qty 1

## 2016-03-07 MED ORDER — HEPARIN SODIUM (PORCINE) 1000 UNIT/ML IJ SOLN
INTRAMUSCULAR | Status: AC
  Administered 2016-03-07: 1600 [IU] via INTRAVENOUS_CENTRAL
  Filled 2016-03-07: qty 5

## 2016-03-07 MED ORDER — HEPARIN SODIUM (PORCINE) 1000 UNIT/ML DIALYSIS
20.0000 [IU]/kg | INTRAMUSCULAR | Status: DC | PRN
  Administered 2016-03-07: 1600 [IU] via INTRAVENOUS_CENTRAL
  Filled 2016-03-07: qty 2

## 2016-03-07 MED ORDER — DOXERCALCIFEROL 4 MCG/2ML IV SOLN
INTRAVENOUS | Status: AC
  Administered 2016-03-07: 5 ug via INTRAVENOUS
  Filled 2016-03-07: qty 4

## 2016-03-07 MED ORDER — ONDANSETRON HCL 4 MG/2ML IJ SOLN: 4.0000 mg | Freq: Four times a day (QID) | INTRAMUSCULAR | Status: DC | PRN

## 2016-03-07 MED ORDER — SEVELAMER CARBONATE 800 MG PO TABS
ORAL_TABLET | ORAL | Status: AC
Start: 2016-03-07 — End: 2016-03-07
  Administered 2016-03-07: 3200 mg via ORAL
  Filled 2016-03-07: qty 4

## 2016-03-07 MED ORDER — TRAMADOL HCL 50 MG PO TABS
ORAL_TABLET | ORAL | Status: AC
  Administered 2016-03-07: 50 mg via ORAL
  Filled 2016-03-07: qty 1

## 2016-03-07 MED ORDER — EPOETIN ALFA 20000 UNIT/ML IJ SOLN
14000.0000 [IU] | INTRAMUSCULAR | Status: DC
  Administered 2016-03-07: 14000 [IU] via INTRAVENOUS
  Filled 2016-03-07: qty 1

## 2016-03-07 MED ORDER — LIDOCAINE HCL (PF) 1 % IJ SOLN: 5.0000 mL | INTRAMUSCULAR | Status: DC | PRN

## 2016-03-07 MED ORDER — SEVELAMER CARBONATE 800 MG PO TABS
ORAL_TABLET | ORAL | Status: AC
  Administered 2016-03-07: 3200 mg via ORAL
  Filled 2016-03-07: qty 4

## 2016-03-07 MED ORDER — ONDANSETRON HCL 4 MG PO TABS: 4.0000 mg | ORAL_TABLET | Freq: Four times a day (QID) | ORAL | Status: DC | PRN

## 2016-03-07 MED ORDER — METOPROLOL TARTRATE 25 MG PO TABS
50.0000 mg | ORAL_TABLET | Freq: Two times a day (BID) | ORAL | Status: DC
  Administered 2016-03-07: 50 mg via ORAL
  Filled 2016-03-07: qty 2

## 2016-03-07 MED ORDER — TRAMADOL HCL 50 MG PO TABS
50.0000 mg | ORAL_TABLET | Freq: Four times a day (QID) | ORAL | Status: DC | PRN
  Administered 2016-03-07: 50 mg via ORAL

## 2016-03-07 MED ORDER — LIDOCAINE-PRILOCAINE 2.5-2.5 % EX CREA: 1.0000 "application " | TOPICAL_CREAM | CUTANEOUS | Status: DC | PRN

## 2016-03-07 MED ORDER — SEVELAMER CARBONATE 800 MG PO TABS
3200.0000 mg | ORAL_TABLET | Freq: Three times a day (TID) | ORAL | Status: DC
  Administered 2016-03-07 (×2): 3200 mg via ORAL
  Filled 2016-03-07: qty 4

## 2016-03-07 MED ORDER — RISPERIDONE MICROSPHERES 25 MG IM SUSR
25.0000 mg | INTRAMUSCULAR | Status: DC
  Filled 2016-03-07: qty 2

## 2016-03-07 MED ORDER — DIPHENHYDRAMINE HCL 25 MG PO CAPS
ORAL_CAPSULE | ORAL | Status: AC
  Administered 2016-03-07: 25 mg via ORAL
  Filled 2016-03-07: qty 1

## 2016-03-07 NOTE — ED Triage Notes (Signed)
Last dialysis was 2 days ago but states only stayed 2.5 hrs. Advised pt her abd looks a little bigger, pt agreed.denies sob or pain.
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
## 2016-03-07 NOTE — Procedures (Signed)
   HEMODIALYSIS TREATMENT NOTE:  4.5 hour low-heparin dialysis completed via right upper arm AVF (15g ante/retrograde). Goal met: 4.1 liters removed without interruption in ultrafiltration.  All blood was returned and hemostasis was achieved within 15 minutes.  Pt states she intends to leave hospital from HD area after eating dinner. AMA waiver was signed.  All ordered medications were given with the exception of Risperdone injection which is unavailable from our pharmacy.  Pt verbalized understanding to acquire this medication from her outpatient mental health provider.  Left HD unit ambulatory with steady gait, 138/72, pulse 110.  ED secretary and 3A charge nurse were notified of pt's departure.  Rockwell Alexandria, RN, CDN
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
## 2016-03-07 NOTE — ED Notes (Signed)
Call from dialysis and pt signing out AMA

## 2016-03-10 ENCOUNTER — Encounter (HOSPITAL_COMMUNITY): Payer: Self-pay | Admitting: Emergency Medicine

## 2016-03-10 ENCOUNTER — Observation Stay (HOSPITAL_COMMUNITY)
Admission: EM | Admit: 2016-03-10 | Discharge: 2016-03-10 | Disposition: A | Discharge status: Home / Self Care | Payer: Medicare Other | Attending: Internal Medicine | Admitting: Internal Medicine

## 2016-03-10 DIAGNOSIS — I5022 Chronic systolic (congestive) heart failure: Secondary | ICD-10-CM | POA: Diagnosis not present

## 2016-03-10 DIAGNOSIS — M3214 Glomerular disease in systemic lupus erythematosus: Secondary | ICD-10-CM | POA: Insufficient documentation

## 2016-03-10 DIAGNOSIS — F250 Schizoaffective disorder, bipolar type: Secondary | ICD-10-CM | POA: Insufficient documentation

## 2016-03-10 DIAGNOSIS — N189 Chronic kidney disease, unspecified: Secondary | ICD-10-CM

## 2016-03-10 DIAGNOSIS — D649 Anemia, unspecified: Secondary | ICD-10-CM | POA: Diagnosis not present

## 2016-03-10 DIAGNOSIS — Z7982 Long term (current) use of aspirin: Secondary | ICD-10-CM | POA: Insufficient documentation

## 2016-03-10 DIAGNOSIS — I120 Hypertensive chronic kidney disease with stage 5 chronic kidney disease or end stage renal disease: Secondary | ICD-10-CM | POA: Diagnosis not present

## 2016-03-10 DIAGNOSIS — Z9119 Patient's noncompliance with other medical treatment and regimen: Secondary | ICD-10-CM | POA: Insufficient documentation

## 2016-03-10 DIAGNOSIS — Z992 Dependence on renal dialysis: Secondary | ICD-10-CM | POA: Diagnosis not present

## 2016-03-10 DIAGNOSIS — F17210 Nicotine dependence, cigarettes, uncomplicated: Secondary | ICD-10-CM | POA: Insufficient documentation

## 2016-03-10 DIAGNOSIS — R946 Abnormal results of thyroid function studies: Secondary | ICD-10-CM | POA: Insufficient documentation

## 2016-03-10 DIAGNOSIS — N170 Acute kidney failure with tubular necrosis: Secondary | ICD-10-CM | POA: Diagnosis not present

## 2016-03-10 DIAGNOSIS — N19 Unspecified kidney failure: Secondary | ICD-10-CM | POA: Diagnosis present

## 2016-03-10 DIAGNOSIS — Z79899 Other long term (current) drug therapy: Secondary | ICD-10-CM | POA: Diagnosis not present

## 2016-03-10 DIAGNOSIS — Z7952 Long term (current) use of systemic steroids: Secondary | ICD-10-CM | POA: Insufficient documentation

## 2016-03-10 DIAGNOSIS — D631 Anemia in chronic kidney disease: Secondary | ICD-10-CM | POA: Diagnosis not present

## 2016-03-10 DIAGNOSIS — I10 Essential (primary) hypertension: Secondary | ICD-10-CM

## 2016-03-10 DIAGNOSIS — I132 Hypertensive heart and chronic kidney disease with heart failure and with stage 5 chronic kidney disease, or end stage renal disease: Secondary | ICD-10-CM | POA: Diagnosis not present

## 2016-03-10 DIAGNOSIS — N186 End stage renal disease: Secondary | ICD-10-CM | POA: Diagnosis not present

## 2016-03-10 LAB — CBC
HEMATOCRIT: 26.2 % — AB (ref 36.0–46.0)
Hemoglobin: 8.2 g/dL — ABNORMAL LOW (ref 12.0–15.0)
MCH: 26.5 pg (ref 26.0–34.0)
MCHC: 31.3 g/dL (ref 30.0–36.0)
MCV: 84.8 fL (ref 78.0–100.0)
PLATELETS: 234 10*3/uL (ref 150–400)
RBC: 3.09 MIL/uL — ABNORMAL LOW (ref 3.87–5.11)
RDW: 20.6 % — AB (ref 11.5–15.5)
WBC: 8.1 10*3/uL (ref 4.0–10.5)

## 2016-03-10 LAB — RENAL FUNCTION PANEL
Albumin: 3.1 g/dL — ABNORMAL LOW (ref 3.5–5.0)
Anion gap: 13 (ref 5–15)
BUN: 59 mg/dL — AB (ref 6–20)
CHLORIDE: 98 mmol/L — AB (ref 101–111)
CO2: 25 mmol/L (ref 22–32)
CREATININE: 9.31 mg/dL — AB (ref 0.44–1.00)
Calcium: 9.3 mg/dL (ref 8.9–10.3)
GFR calc Af Amer: 6 mL/min — ABNORMAL LOW (ref >60)
GFR calc non Af Amer: 5 mL/min — ABNORMAL LOW (ref >60)
Glucose, Bld: 83 mg/dL (ref 65–99)
POTASSIUM: 5.3 mmol/L — AB (ref 3.5–5.1)
Phosphorus: 7.9 mg/dL — ABNORMAL HIGH (ref 2.5–4.6)
Sodium: 136 mmol/L (ref 135–145)

## 2016-03-10 MED ORDER — PREDNISONE 20 MG PO TABS: 20.0000 mg | ORAL_TABLET | Freq: Every day | ORAL | Status: DC

## 2016-03-10 MED ORDER — PENTAFLUOROPROP-TETRAFLUOROETH EX AERO: 1.0000 "application " | INHALATION_SPRAY | CUTANEOUS | Status: DC | PRN

## 2016-03-10 MED ORDER — ACETAMINOPHEN 500 MG PO TABS: 1000.0000 mg | ORAL_TABLET | Freq: Four times a day (QID) | ORAL | Status: DC | PRN

## 2016-03-10 MED ORDER — ALTEPLASE 2 MG IJ SOLR
2.0000 mg | Freq: Once | INTRAMUSCULAR | Status: DC | PRN
  Filled 2016-03-10: qty 2

## 2016-03-10 MED ORDER — TRAMADOL HCL 50 MG PO TABS: 50.0000 mg | ORAL_TABLET | Freq: Four times a day (QID) | ORAL | Status: DC | PRN

## 2016-03-10 MED ORDER — SEVELAMER CARBONATE 800 MG PO TABS
3200.0000 mg | ORAL_TABLET | Freq: Three times a day (TID) | ORAL | Status: DC
  Administered 2016-03-10: 3200 mg via ORAL
  Filled 2016-03-10 (×7): qty 4

## 2016-03-10 MED ORDER — SODIUM CHLORIDE 0.9 % IV SOLN: 100.0000 mL | INTRAVENOUS | Status: DC | PRN

## 2016-03-10 MED ORDER — SODIUM CHLORIDE 0.9 % IV SOLN
125.0000 mg | Freq: Once | INTRAVENOUS | Status: AC
  Administered 2016-03-10: 125 mg via INTRAVENOUS
  Filled 2016-03-10: qty 10

## 2016-03-10 MED ORDER — LIDOCAINE-PRILOCAINE 2.5-2.5 % EX CREA
1.0000 | TOPICAL_CREAM | CUTANEOUS | Status: DC | PRN
Start: 2016-03-10 — End: 2016-03-10

## 2016-03-10 MED ORDER — METOPROLOL TARTRATE 50 MG PO TABS: 50.0000 mg | ORAL_TABLET | Freq: Two times a day (BID) | ORAL | Status: DC

## 2016-03-10 MED ORDER — LIDOCAINE HCL (PF) 1 % IJ SOLN: 5.0000 mL | INTRAMUSCULAR | Status: DC | PRN

## 2016-03-10 MED ORDER — DIPHENHYDRAMINE HCL 25 MG PO CAPS
25.0000 mg | ORAL_CAPSULE | Freq: Four times a day (QID) | ORAL | Status: DC | PRN
  Administered 2016-03-10: 25 mg via ORAL

## 2016-03-10 MED ORDER — SEVELAMER CARBONATE 800 MG PO TABS
3200.0000 mg | ORAL_TABLET | Freq: Three times a day (TID) | ORAL | Status: DC
  Filled 2016-03-10: qty 4

## 2016-03-10 MED ORDER — DOXERCALCIFEROL 4 MCG/2ML IV SOLN
5.0000 ug | INTRAVENOUS | Status: DC
  Administered 2016-03-10: 5 ug via INTRAVENOUS
  Filled 2016-03-10 (×2): qty 4

## 2016-03-10 MED ORDER — EPOETIN ALFA 20000 UNIT/ML IJ SOLN
14000.0000 [IU] | INTRAMUSCULAR | Status: DC
Start: 2016-03-10 — End: 2016-03-10

## 2016-03-10 MED ORDER — EPOETIN ALFA 10000 UNIT/ML IJ SOLN
10000.0000 [IU] | INTRAMUSCULAR | Status: DC
  Administered 2016-03-10: 10000 [IU] via INTRAVENOUS
  Filled 2016-03-10 (×2): qty 1

## 2016-03-10 MED ORDER — HEPARIN SODIUM (PORCINE) 1000 UNIT/ML DIALYSIS
1000.0000 [IU] | INTRAMUSCULAR | Status: DC | PRN
  Filled 2016-03-10: qty 1

## 2016-03-10 MED ORDER — ASPIRIN-ACETAMINOPHEN-CAFFEINE 250-250-65 MG PO TABS
2.0000 | ORAL_TABLET | Freq: Four times a day (QID) | ORAL | Status: DC | PRN
  Filled 2016-03-10: qty 2

## 2016-03-10 MED ORDER — HEPARIN SODIUM (PORCINE) 1000 UNIT/ML DIALYSIS
20.0000 [IU]/kg | INTRAMUSCULAR | Status: DC | PRN
  Filled 2016-03-10: qty 2

## 2016-03-10 MED ORDER — EPOETIN ALFA 4000 UNIT/ML IJ SOLN
4000.0000 [IU] | INTRAMUSCULAR | Status: DC
  Administered 2016-03-10: 4000 [IU] via INTRAVENOUS
  Filled 2016-03-10 (×2): qty 1

## 2016-03-10 MED ORDER — RISPERIDONE MICROSPHERES 25 MG IM SUSR
25.0000 mg | INTRAMUSCULAR | Status: DC
  Filled 2016-03-10: qty 2
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
## 2016-03-10 NOTE — Discharge Summary (Signed)
Physician Discharge Summary  Sara [name] [id number] DOB: [birth year] DOA: 03/10/2016  PCP: Pcp Not In System  Admit date: 03/10/2016 Discharge date: 03/10/2016  Time spent: 25 minutes  Recommendations for Outpatient Follow-up:  1. Follow up with endocrinology as outpatient to address abnormal thyroid studies 2. Follow up at [facility] for next dialysis session   Discharge Diagnoses:    Essential hypertension   Systemic lupus (HCC)   Anemia in ESRD (end-stage renal disease) ([place])   ESRD (end stage renal disease) ([place])   Abnormal thyroid function test  Discharge Condition: stable  Diet recommendation: low salt  Filed Weights   03/10/16 0730 03/10/16 0940  Weight: 81.7 kg (180 lb 1 oz) 81.6 kg (179 lb 14.3 oz)    Brief/Interim Summary: Sara [name] a 35 y.o.femalewho has a history of ESRD on HD, who cannot have dialysis as an outpatient, presents to ED for regular dialysis session. She denies any shortness of breath, chest pain, fever, cough or other complaints. She does not describe worsening edema. She feels she is in her usual state of health  Patient underwent dialysis without any immediate complications. She is feeling better after dialysis. She is noted to be anemic, likely due to renal disease, but this is near her baseline. She has been referred to endocrinology to address abnormal thyroid studies and further workup for possible hyperthyroidism. Patient is stable for discharge home.  Procedures:  dialysis  Consultations:  Nephrology  Discharge Exam: Vitals:   03/10/16 1345 03/10/16 1350  BP: (!) 166/105 148/83  Pulse: 108 107  Resp: 20   Temp: 97.6 F (36.4 C)     General: NAD Cardiovascular: s1, s2, tachycardic Respiratory: CTA B  Discharge Instructions   Discharge Instructions    Diet - low sodium heart healthy    Complete by:  As directed    Increase activity slowly    Complete by:  As directed      Current Discharge  Medication List    CONTINUE these medications which have NOT CHANGED   Details  acetaminophen (TYLENOL) 500 MG tablet Take 1,000 mg by mouth every 6 (six) hours as needed for mild pain, moderate pain or headache.     aspirin-acetaminophen-caffeine (EXCEDRIN MIGRAINE) 250-250-65 MG tablet Take 2 tablets by mouth every 6 (six) hours as needed for headache.    metoprolol (LOPRESSOR) 50 MG tablet Take 1 tablet (50 mg total) by mouth 2 (two) times daily. Qty: 60 tablet, Refills: 0    predniSONE (DELTASONE) 20 MG tablet Take 1 tablet (20 mg total) by mouth daily with breakfast. Qty: 30 tablet, Refills: 6    risperiDONE microspheres (RISPERDAL CONSTA) 25 MG injection Inject 25 mg into the muscle every 14 (fourteen) days.    sevelamer carbonate (RENVELA) 800 MG tablet Take 4 tablets (3,200 mg total) by mouth 3 (three) times daily with meals. Qty: 360 tablet, Refills: 6    traMADol (ULTRAM) 50 MG tablet Take 1 tablet (50 mg total) by mouth every 6 (six) hours as needed for moderate pain. Qty: 30 tablet, Refills: 0       Allergies  Allergen Reactions  . Ativan [Lorazepam] Swelling and Other (See Comments)    Reaction:  Difficulty speaking/slurred speech   . Geodon [Ziprasidone Hcl] Itching and Swelling    Reaction:  Tongue swelling   . Keflex [Cephalexin] Swelling and Other (See Comments)    Reaction:  Tongue swelling/pt is unable to talk   . Haldol [Haloperidol Lactate] Swelling and  Other (See Comments)    Reaction:  Tongue swelling -- 05/31/15  Pt is able to take with Benadryl.    . Other Itching and Other (See Comments)    Pt states that she is allergic to wool.    Follow-up Information    Glade Lloyd, MD Follow up.   Specialty:  Endocrinology Why:  call to confirm appointment Contact information: [place] [address] [phone]            The results of significant diagnostics from this hospitalization (including imaging, microbiology, ancillary and  laboratory) are listed below for reference.    Significant Diagnostic Studies: Dg Chest 2 View  Result Date: 02/23/2016 CLINICAL DATA:  Cough and congestion for the past 4 days. History of end-stage renal disease, on dialysis EXAM: CHEST  2 VIEW COMPARISON:  02/20/2016; 10/29/2015; 07/02/2015 FINDINGS: Grossly unchanged enlarged cardiac silhouette and mediastinal contours. The pulmonary vasculature appears less distinct than present examination with cephalization of flow. There is minimal pleural parenchymal thickening about the bilateral major fissures as well as the right minor fissure. Worsening bibasilar opacities, favored to represent atelectasis. No discrete focal airspace opacities. No pleural effusion or pneumothorax. No acute osseus abnormalities. Chronic deformities involving the distal end of the bilateral clavicles. Old fracture involving the posterior aspect of the right third rib. IMPRESSION: Cardiomegaly with findings worrisome for superimposed pulmonary edema. No pleural effusion. Electronically Signed   By: Sandi Mariscal M.D.   On: 02/23/2016 09:51   Dg Chest 2 View  Result Date: 02/15/2016 CLINICAL DATA:  Three days of cough, end-stage renal disease on dialysis and is currently in need of dialysis. History of lupus nephritis and end-stage renal disease. CHF EXAM: CHEST  2 VIEW COMPARISON:  PA and lateral chest x-ray of October 29, 2015 FINDINGS: The lungs are adequately inflated. The lung markings are coarse in the retrocardiac region on the left. The interstitial markings are increased bilaterally. There is minimal blunting of the costophrenic angles bilaterally. The cardiac silhouette is enlarged. The pulmonary vascularity is prominent centrally. There is calcification in the wall of the aortic arch. There is old deformity of the lateral aspect of the right 6 rib. IMPRESSION: CHF with mild interstitial edema and small bilateral pleural effusions. Early left lower lobe atelectasis is  suspected as well. Aortic atherosclerosis. Electronically Signed   By: David  Martinique M.D.   On: 02/15/2016 08:15   Dg Chest Portable 1 View  Result Date: 02/20/2016 CLINICAL DATA:  Midchest pain tonight EXAM: PORTABLE CHEST 1 VIEW COMPARISON:  02/15/2016 FINDINGS: There is moderate unchanged cardiomegaly. The lungs are clear. There is mild costophrenic angle blunting which could represent tiny pleural effusions. Mediastinal and hilar contours are unremarkable and unchanged. Lysis of the distal clavicles bilaterally. IMPRESSION: Unchanged cardiomegaly.  Possible small effusions. Electronically Signed   By: Andreas Newport M.D.   On: 02/20/2016 01:01    Microbiology: No results found for this or any previous visit (from the past 240 hour(s)).   Labs: Basic Metabolic Panel:  Recent Labs Lab 03/03/16 1516 03/05/16 1621 03/07/16 1247 03/10/16 1003  NA 133* 132* 132* 136  K 5.0 3.9 5.3* 5.3*  CL 96* 95* 94* 98*  CO2 21* 27 24 25   GLUCOSE 101* 84 112* 83  BUN 74* 46* 70* 59*  CREATININE 10.51* 6.84* 9.77* 9.31*  CALCIUM 9.2 8.7* 9.0 9.3  PHOS 8.6* 4.9* 8.2* 7.9*   Liver Function Tests:  Recent Labs Lab 03/03/16 1516 03/05/16 1621 03/07/16  1247 03/10/16 1003  ALBUMIN 3.2* 3.1* 3.0* 3.1*   No results for input(s): LIPASE, AMYLASE in the last 168 hours. No results for input(s): AMMONIA in the last 168 hours. CBC:  Recent Labs Lab 03/03/16 1516 03/05/16 1621 03/07/16 1247 03/10/16 1003  WBC 8.8 9.9 8.6 8.1  HGB 7.9* 8.1* 7.9* 8.2*  HCT 25.4* 25.6* 25.2* 26.2*  MCV 84.1 84.2 84.0 84.8  PLT 270 245 264 234   Cardiac Enzymes: No results for input(s): CKTOTAL, CKMB, CKMBINDEX, TROPONINI in the last 168 hours. BNP: BNP (last 3 results) No results for input(s): BNP in the last 8760 hours.  ProBNP (last 3 results) No results for input(s): PROBNP in the last 8760 hours.  CBG: No results for input(s): GLUCAP in the last 168 hours.     SignedKathie Dike MD.   [facility] 03/10/2016, 2:47 PM
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
## 2016-03-10 NOTE — ED Triage Notes (Signed)
PT requesting dialysis today. Last full treatment reported by pt to be on  03/07/16. PT denies any complaints at this time.

## 2016-03-12 ENCOUNTER — Encounter (HOSPITAL_COMMUNITY): Payer: Self-pay | Admitting: Emergency Medicine

## 2016-03-12 ENCOUNTER — Other Ambulatory Visit: Payer: Self-pay

## 2016-03-12 ENCOUNTER — Observation Stay (HOSPITAL_COMMUNITY)
Admission: EM | Admit: 2016-03-12 | Discharge: 2016-03-12 | Disposition: A | Discharge status: Home / Self Care | Payer: Medicare Other | Attending: Family Medicine | Admitting: Family Medicine

## 2016-03-12 DIAGNOSIS — I5022 Chronic systolic (congestive) heart failure: Secondary | ICD-10-CM | POA: Diagnosis not present

## 2016-03-12 DIAGNOSIS — I10 Essential (primary) hypertension: Secondary | ICD-10-CM | POA: Diagnosis not present

## 2016-03-12 DIAGNOSIS — F17210 Nicotine dependence, cigarettes, uncomplicated: Secondary | ICD-10-CM | POA: Diagnosis not present

## 2016-03-12 DIAGNOSIS — I132 Hypertensive heart and chronic kidney disease with heart failure and with stage 5 chronic kidney disease, or end stage renal disease: Secondary | ICD-10-CM | POA: Diagnosis not present

## 2016-03-12 DIAGNOSIS — E875 Hyperkalemia: Secondary | ICD-10-CM | POA: Diagnosis not present

## 2016-03-12 DIAGNOSIS — M899 Disorder of bone, unspecified: Secondary | ICD-10-CM | POA: Diagnosis not present

## 2016-03-12 DIAGNOSIS — Z7982 Long term (current) use of aspirin: Secondary | ICD-10-CM | POA: Diagnosis not present

## 2016-03-12 DIAGNOSIS — D631 Anemia in chronic kidney disease: Secondary | ICD-10-CM | POA: Insufficient documentation

## 2016-03-12 DIAGNOSIS — Z79899 Other long term (current) drug therapy: Secondary | ICD-10-CM | POA: Insufficient documentation

## 2016-03-12 DIAGNOSIS — Z7952 Long term (current) use of systemic steroids: Secondary | ICD-10-CM | POA: Insufficient documentation

## 2016-03-12 DIAGNOSIS — Z9119 Patient's noncompliance with other medical treatment and regimen: Secondary | ICD-10-CM | POA: Insufficient documentation

## 2016-03-12 DIAGNOSIS — M3214 Glomerular disease in systemic lupus erythematosus: Secondary | ICD-10-CM | POA: Insufficient documentation

## 2016-03-12 DIAGNOSIS — N186 End stage renal disease: Secondary | ICD-10-CM

## 2016-03-12 DIAGNOSIS — I120 Hypertensive chronic kidney disease with stage 5 chronic kidney disease or end stage renal disease: Secondary | ICD-10-CM | POA: Diagnosis not present

## 2016-03-12 DIAGNOSIS — F250 Schizoaffective disorder, bipolar type: Secondary | ICD-10-CM | POA: Diagnosis not present

## 2016-03-12 DIAGNOSIS — E8770 Fluid overload, unspecified: Secondary | ICD-10-CM | POA: Diagnosis present

## 2016-03-12 DIAGNOSIS — Z992 Dependence on renal dialysis: Secondary | ICD-10-CM | POA: Diagnosis not present

## 2016-03-12 LAB — CBC WITH DIFFERENTIAL/PLATELET
Basophils Absolute: 0 10*3/uL (ref 0.0–0.1)
Basophils Relative: 0 %
Eosinophils Absolute: 0.1 10*3/uL (ref 0.0–0.7)
Eosinophils Relative: 1 %
HCT: 27.2 % — ABNORMAL LOW (ref 36.0–46.0)
Hemoglobin: 8.4 g/dL — ABNORMAL LOW (ref 12.0–15.0)
Lymphocytes Relative: 20 %
Lymphs Abs: 1.6 10*3/uL (ref 0.7–4.0)
MCH: 26.5 pg (ref 26.0–34.0)
MCHC: 30.9 g/dL (ref 30.0–36.0)
MCV: 85.8 fL (ref 78.0–100.0)
Monocytes Absolute: 1 10*3/uL (ref 0.1–1.0)
Monocytes Relative: 13 %
Neutro Abs: 5.4 10*3/uL (ref 1.7–7.7)
Neutrophils Relative %: 66 %
Platelets: 228 10*3/uL (ref 150–400)
RBC: 3.17 MIL/uL — ABNORMAL LOW (ref 3.87–5.11)
RDW: 20.7 % — ABNORMAL HIGH (ref 11.5–15.5)
WBC: 8.1 10*3/uL (ref 4.0–10.5)

## 2016-03-12 LAB — RENAL FUNCTION PANEL
Albumin: 3.1 g/dL — ABNORMAL LOW (ref 3.5–5.0)
Anion gap: 13 (ref 5–15)
BUN: 46 mg/dL — ABNORMAL HIGH (ref 6–20)
CO2: 26 mmol/L (ref 22–32)
Calcium: 9.3 mg/dL (ref 8.9–10.3)
Chloride: 98 mmol/L — ABNORMAL LOW (ref 101–111)
Creatinine, Ser: 8.1 mg/dL — ABNORMAL HIGH (ref 0.44–1.00)
GFR calc Af Amer: 7 mL/min — ABNORMAL LOW (ref >60)
GFR calc non Af Amer: 6 mL/min — ABNORMAL LOW (ref >60)
Glucose, Bld: 101 mg/dL — ABNORMAL HIGH (ref 65–99)
Phosphorus: 7 mg/dL — ABNORMAL HIGH (ref 2.5–4.6)
Potassium: 4.7 mmol/L (ref 3.5–5.1)
Sodium: 137 mmol/L (ref 135–145)

## 2016-03-12 MED ORDER — LIDOCAINE-PRILOCAINE 2.5-2.5 % EX CREA: 1.0000 "application " | TOPICAL_CREAM | CUTANEOUS | Status: DC | PRN

## 2016-03-12 MED ORDER — LIDOCAINE HCL (PF) 1 % IJ SOLN: 5.0000 mL | INTRAMUSCULAR | Status: DC | PRN

## 2016-03-12 MED ORDER — TRAMADOL HCL 50 MG PO TABS
50.0000 mg | ORAL_TABLET | Freq: Four times a day (QID) | ORAL | Status: DC | PRN
  Administered 2016-03-12: 50 mg via ORAL

## 2016-03-12 MED ORDER — SODIUM CHLORIDE 0.9 % IV SOLN: 100.0000 mL | INTRAVENOUS | Status: DC | PRN

## 2016-03-12 MED ORDER — HEPARIN SODIUM (PORCINE) 1000 UNIT/ML DIALYSIS: 1000.0000 [IU] | INTRAMUSCULAR | Status: DC | PRN

## 2016-03-12 MED ORDER — HEPARIN SODIUM (PORCINE) 1000 UNIT/ML DIALYSIS: 20.0000 [IU]/kg | INTRAMUSCULAR | Status: DC | PRN

## 2016-03-12 MED ORDER — PENTAFLUOROPROP-TETRAFLUOROETH EX AERO: 1.0000 "application " | INHALATION_SPRAY | CUTANEOUS | Status: DC | PRN

## 2016-03-12 MED ORDER — ALBUTEROL SULFATE HFA 108 (90 BASE) MCG/ACT IN AERS
2.0000 | INHALATION_SPRAY | Freq: Once | RESPIRATORY_TRACT | Status: AC
  Administered 2016-03-12: 2 via RESPIRATORY_TRACT
  Filled 2016-03-12: qty 6.7

## 2016-03-12 MED ORDER — ACETAMINOPHEN 500 MG PO TABS: 1000.0000 mg | ORAL_TABLET | Freq: Four times a day (QID) | ORAL | Status: DC | PRN

## 2016-03-12 MED ORDER — PREDNISONE 20 MG PO TABS: 20.0000 mg | ORAL_TABLET | Freq: Every day | ORAL | Status: DC

## 2016-03-12 MED ORDER — METOPROLOL TARTRATE 50 MG PO TABS
50.0000 mg | ORAL_TABLET | Freq: Two times a day (BID) | ORAL | Status: DC
  Administered 2016-03-12: 50 mg via ORAL
  Filled 2016-03-12: qty 1

## 2016-03-12 MED ORDER — SEVELAMER CARBONATE 800 MG PO TABS
3200.0000 mg | ORAL_TABLET | Freq: Three times a day (TID) | ORAL | Status: DC
  Administered 2016-03-12: 3200 mg via ORAL

## 2016-03-12 MED ORDER — EPOETIN ALFA 4000 UNIT/ML IJ SOLN
4000.0000 [IU] | INTRAMUSCULAR | Status: DC
  Administered 2016-03-12: 4000 [IU] via SUBCUTANEOUS

## 2016-03-12 MED ORDER — ALTEPLASE 2 MG IJ SOLR: 2.0000 mg | Freq: Once | INTRAMUSCULAR | Status: DC | PRN

## 2016-03-12 NOTE — ED Triage Notes (Signed)
Denies any problems.  Here for dialyisis today.
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
## 2016-03-12 NOTE — Consult Note (Signed)
ACCACIA DIMURO MRN: [id number] DOB/AGE: 1980/07/01 35 y.o. Primary Care Physician:Pcp Not In System Admit date: 03/12/2016 Chief Complaint:  Chief Complaint  Patient presents with  . Dialysis   HPI: Pt is 35 year old female with past medical hx of ESRD who was admitted with c/o dyspnea," I need dialysis "  HPI dates 2 days ago as pt came to ER asking for her dialysis. Pt says " I need my dialysis ,My last dialysis was on monday ." No c/o fever/cough/chills NO c/o nausea/vomiting. NO c/o abdominal pain. No c/o hematuria . NO c/o syncope.    Past Medical History:  Diagnosis Date  . Acute myopericarditis    hx/notes 10/09/2014  . Bipolar disorder ([place])    [name] 10/09/2014  . CHF (congestive heart failure) ([place])    systolic/notes [id number]  . Chronic anemia    [name] 10/09/2014  . Current use of steroid medication 12/21/2015  . ESRD (end stage renal disease) on dialysis [facility])    "[name]; [facility]" (10/09/2014)  . History of blood transfusion    "this is probably my 3rd" (10/09/2014)  . Hypertension   . Low back pain   . Lupus    lupus w nephritis  . 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 Non-compliant patient   . Positive ANA (antinuclear antibody) 08/16/2012  . Positive Smith antibody 08/16/2012  . Pregnancy   . Psychosis   . Schizoaffective disorder, bipolar type ([place]) 11/20/2014  . Schizophrenia ([place])    [name] 10/09/2014  . Tobacco abuse 02/20/2014        Family History  Problem Relation Age of Onset  . Drug abuse Father   . Kidney disease Father     Social History:  reports that she has been smoking Cigarettes.  She has a 2.00 pack-year smoking history. She has never used smokeless tobacco. She reports that she does not drink alcohol or use drugs.   Allergies:  Allergies  Allergen Reactions  . Ativan [Lorazepam] Swelling and Other (See Comments)    Reaction:  Difficulty speaking/slurred speech   . Geodon [Ziprasidone Hcl] Itching and Swelling    Reaction:  Tongue swelling    . Keflex [Cephalexin] Swelling and Other (See Comments)    Reaction:  Tongue swelling/pt is unable to talk   . Haldol [Haloperidol Lactate] Swelling and Other (See Comments)    Reaction:  Tongue swelling -- 05/31/15  Pt is able to take with Benadryl.    . Other Itching and Other (See Comments)    Pt states that she is allergic to wool.      (Not in a hospital admission)     [id number] from the symptoms mentioned above,there are no other symptoms referable to all systems reviewed.      Physical Exam: Vital signs in last 24 hours: Temp:  [98 F (36.7 C)] 98 F (36.7 C) (12/06 0726) Pulse Rate:  [105] 105 (12/06 0726) Resp:  [16] 16 (12/06 0726) BP: (165)/(115) 165/115 (12/06 0726) SpO2:  [98 %-100 %] 98 % (12/06 0847) Weight:  [150 lb (68 kg)] 150 lb (68 kg) (12/06 0724) Weight change:     Intake/Output from previous day: No intake/output data recorded. No intake/output data recorded.   Physical Exam: General- pt is awake,alert, follows comands Resp- No acute REsp distress, decreased at bases CVS- S1S2 regular in rate and rhythm, NO rubs  GIT- BS+, soft, NT, ND EXT- 1+ LE Edema, NO Cyanosis CNS- CN 2-12 grossly intact. Moving all 4 extremities Access- AVF+  Lab Results:  CBC    Component Value Date/Time   WBC 8.1 03/10/2016 1003   RBC 3.09 (L) 03/10/2016 1003   HGB 8.2 (L) 03/10/2016 1003   HCT 26.2 (L) 03/10/2016 1003   PLT 234 03/10/2016 1003   MCV 84.8 03/10/2016 1003   MCH 26.5 03/10/2016 1003   MCHC 31.3 03/10/2016 1003   RDW 20.6 (H) 03/10/2016 1003   LYMPHSABS 0.7 02/25/2016 0901   MONOABS 0.2 02/25/2016 0901   EOSABS 0.0 02/25/2016 0901   BASOSABS 0.0 02/25/2016 0901      BMET BMP Latest Ref Rng & Units 03/10/2016 03/07/2016 03/05/2016  Glucose 65 - 99 mg/dL 83 112(H) 84  BUN 6 - 20 mg/dL 59(H) 70(H) 46(H)  Creatinine 0.44 - 1.00 mg/dL 9.31(H) 9.77(H) 6.84(H)  Sodium 135 - 145 mmol/L 136 132(L) 132(L)  Potassium 3.5 - 5.1 mmol/L  5.3(H) 5.3(H) 3.9  Chloride 101 - 111 mmol/L 98(L) 94(L) 95(L)  CO2 22 - 32 mmol/L 25 24 27   Calcium 8.9 - 10.3 mg/dL 9.3 9.0 8.7(L)         Lab Results  Component Value Date   PTH 1,021 (H) 02/29/2016   PTH Comment 02/29/2016   CALCIUM 9.3 03/10/2016   CAION 1.10 (L) 02/23/2016   PHOS 7.9 (H) 03/10/2016      Impression: 1)Renal  ESRD on HD                Pt is not on regular  Schedule sec to her complaince/adherence issues                Will dialyze pt today  2)HTN Bp is not at goal Hx of non adherence to medical tx  3)Anemia In ESRD the goal for HGb is 9--11. 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   Not at goal sec to non adherence to hd tx   Phosphorus not at goal.  on binders  5)Psych .  Hx of   psycosis Schizophrenia Primary MD following  6)Electrolytes  Hx of Hyperkalemia      Sec to non adherence to HD   Hx of Hyponatremia   7)Acid base Co2  at goal    Plan:  Will dialyze today Will use 2 k bath Will keep on epo-4000 units  will dialyze for 4.5 hrs ( if pt stays )  Will try to take 4 liters off if possible  I again educated pt about need to adhere to medical tx.   Addendum Pt seen on HD. Pt tolerating tx well.   Alquan Morrish S 03/12/2016, 10:05 AM
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
## 2016-03-12 NOTE — Patient Outreach (Signed)
[facility]) Care Management  03/12/2016  Sara [name] [birth year] [id number]   Patient has not responded to calls or letter.  Will proceed with case closure.  Jone Baseman, RN, MSN [place] [phone]

## 2016-03-14 ENCOUNTER — Observation Stay (HOSPITAL_COMMUNITY)
Admission: EM | Admit: 2016-03-14 | Discharge: 2016-03-14 | Disposition: A | Discharge status: Home / Self Care | Payer: Medicare Other | Attending: Internal Medicine | Admitting: Internal Medicine

## 2016-03-14 ENCOUNTER — Encounter (HOSPITAL_COMMUNITY): Payer: Self-pay | Admitting: Emergency Medicine

## 2016-03-14 DIAGNOSIS — I132 Hypertensive heart and chronic kidney disease with heart failure and with stage 5 chronic kidney disease, or end stage renal disease: Secondary | ICD-10-CM | POA: Diagnosis not present

## 2016-03-14 DIAGNOSIS — Z992 Dependence on renal dialysis: Secondary | ICD-10-CM | POA: Insufficient documentation

## 2016-03-14 DIAGNOSIS — Z7952 Long term (current) use of systemic steroids: Secondary | ICD-10-CM | POA: Diagnosis not present

## 2016-03-14 DIAGNOSIS — Z9119 Patient's noncompliance with other medical treatment and regimen: Secondary | ICD-10-CM | POA: Insufficient documentation

## 2016-03-14 DIAGNOSIS — F17210 Nicotine dependence, cigarettes, uncomplicated: Secondary | ICD-10-CM | POA: Insufficient documentation

## 2016-03-14 DIAGNOSIS — G8929 Other chronic pain: Secondary | ICD-10-CM | POA: Diagnosis not present

## 2016-03-14 DIAGNOSIS — N186 End stage renal disease: Secondary | ICD-10-CM | POA: Diagnosis not present

## 2016-03-14 DIAGNOSIS — I10 Essential (primary) hypertension: Secondary | ICD-10-CM | POA: Diagnosis present

## 2016-03-14 DIAGNOSIS — Z79899 Other long term (current) drug therapy: Secondary | ICD-10-CM | POA: Insufficient documentation

## 2016-03-14 DIAGNOSIS — I5022 Chronic systolic (congestive) heart failure: Secondary | ICD-10-CM | POA: Diagnosis not present

## 2016-03-14 DIAGNOSIS — D631 Anemia in chronic kidney disease: Secondary | ICD-10-CM | POA: Diagnosis not present

## 2016-03-14 DIAGNOSIS — I120 Hypertensive chronic kidney disease with stage 5 chronic kidney disease or end stage renal disease: Secondary | ICD-10-CM | POA: Diagnosis not present

## 2016-03-14 DIAGNOSIS — F250 Schizoaffective disorder, bipolar type: Secondary | ICD-10-CM | POA: Diagnosis not present

## 2016-03-14 DIAGNOSIS — Z7982 Long term (current) use of aspirin: Secondary | ICD-10-CM | POA: Diagnosis not present

## 2016-03-14 DIAGNOSIS — Z720 Tobacco use: Secondary | ICD-10-CM | POA: Diagnosis present

## 2016-03-14 DIAGNOSIS — M3214 Glomerular disease in systemic lupus erythematosus: Secondary | ICD-10-CM | POA: Insufficient documentation

## 2016-03-14 LAB — RENAL FUNCTION PANEL
ALBUMIN: 3.1 g/dL — AB (ref 3.5–5.0)
Anion gap: 15 (ref 5–15)
BUN: 43 mg/dL — AB (ref 6–20)
CALCIUM: 9.1 mg/dL (ref 8.9–10.3)
CO2: 24 mmol/L (ref 22–32)
CREATININE: 8.23 mg/dL — AB (ref 0.44–1.00)
Chloride: 94 mmol/L — ABNORMAL LOW (ref 101–111)
GFR calc Af Amer: 7 mL/min — ABNORMAL LOW (ref >60)
GFR calc non Af Amer: 6 mL/min — ABNORMAL LOW (ref >60)
GLUCOSE: 69 mg/dL (ref 65–99)
PHOSPHORUS: 7.5 mg/dL — AB (ref 2.5–4.6)
Potassium: 4.3 mmol/L (ref 3.5–5.1)
SODIUM: 133 mmol/L — AB (ref 135–145)

## 2016-03-14 MED ORDER — SEVELAMER CARBONATE 800 MG PO TABS: 3200.0000 mg | ORAL_TABLET | Freq: Three times a day (TID) | ORAL | Status: DC

## 2016-03-14 MED ORDER — PREDNISONE 20 MG PO TABS
20.0000 mg | ORAL_TABLET | Freq: Every day | ORAL | Status: DC
Start: 2016-03-15 — End: 2016-03-14

## 2016-03-14 MED ORDER — SODIUM CHLORIDE 0.9 % IV SOLN
125.0000 mg | Freq: Once | INTRAVENOUS | Status: AC
  Administered 2016-03-14: 125 mg via INTRAVENOUS
  Filled 2016-03-14: qty 10

## 2016-03-14 MED ORDER — ACETAMINOPHEN 500 MG PO TABS: 1000.0000 mg | ORAL_TABLET | Freq: Four times a day (QID) | ORAL | Status: DC | PRN

## 2016-03-14 MED ORDER — EPOETIN ALFA 10000 UNIT/ML IJ SOLN
10000.0000 [IU] | INTRAMUSCULAR | Status: DC
  Administered 2016-03-14: 10000 [IU] via INTRAVENOUS
  Filled 2016-03-14 (×2): qty 1

## 2016-03-14 MED ORDER — ACETAMINOPHEN 650 MG RE SUPP: 650.0000 mg | Freq: Four times a day (QID) | RECTAL | Status: DC | PRN

## 2016-03-14 MED ORDER — SODIUM CHLORIDE 0.9 % IV SOLN: 100.0000 mL | INTRAVENOUS | Status: DC | PRN

## 2016-03-14 MED ORDER — DIPHENHYDRAMINE HCL 25 MG PO CAPS: 25.0000 mg | ORAL_CAPSULE | Freq: Four times a day (QID) | ORAL | Status: DC | PRN

## 2016-03-14 MED ORDER — EPOETIN ALFA 10000 UNIT/ML IJ SOLN
INTRAMUSCULAR | Status: AC
  Administered 2016-03-14: 10000 [IU] via INTRAVENOUS
  Filled 2016-03-14: qty 1

## 2016-03-14 MED ORDER — LIDOCAINE HCL (PF) 1 % IJ SOLN: 5.0000 mL | INTRAMUSCULAR | Status: DC | PRN

## 2016-03-14 MED ORDER — SEVELAMER CARBONATE 800 MG PO TABS
3200.0000 mg | ORAL_TABLET | Freq: Three times a day (TID) | ORAL | Status: DC
Start: 2016-03-14 — End: 2016-03-14
  Filled 2016-03-14 (×9): qty 4

## 2016-03-14 MED ORDER — EPOETIN ALFA 20000 UNIT/ML IJ SOLN: 14000.0000 [IU] | INTRAMUSCULAR | Status: DC

## 2016-03-14 MED ORDER — LIDOCAINE-PRILOCAINE 2.5-2.5 % EX CREA: 1.0000 "application " | TOPICAL_CREAM | CUTANEOUS | Status: DC | PRN

## 2016-03-14 MED ORDER — HEPARIN SODIUM (PORCINE) 5000 UNIT/ML IJ SOLN: 5000.0000 [IU] | Freq: Three times a day (TID) | INTRAMUSCULAR | Status: DC

## 2016-03-14 MED ORDER — TRAMADOL HCL 50 MG PO TABS: 50.0000 mg | ORAL_TABLET | Freq: Four times a day (QID) | ORAL | Status: DC | PRN

## 2016-03-14 MED ORDER — ASPIRIN-ACETAMINOPHEN-CAFFEINE 250-250-65 MG PO TABS: 2.0000 | ORAL_TABLET | Freq: Four times a day (QID) | ORAL | Status: DC | PRN

## 2016-03-14 MED ORDER — EPOETIN ALFA 4000 UNIT/ML IJ SOLN
4000.0000 [IU] | INTRAMUSCULAR | Status: DC
  Filled 2016-03-14 (×2): qty 1

## 2016-03-14 MED ORDER — DOXERCALCIFEROL 4 MCG/2ML IV SOLN: 5.0000 ug | INTRAVENOUS | Status: DC

## 2016-03-14 MED ORDER — PENTAFLUOROPROP-TETRAFLUOROETH EX AERO: 1.0000 "application " | INHALATION_SPRAY | CUTANEOUS | Status: DC | PRN

## 2016-03-14 MED ORDER — ACETAMINOPHEN 325 MG PO TABS: 650.0000 mg | ORAL_TABLET | Freq: Four times a day (QID) | ORAL | Status: DC | PRN

## 2016-03-14 MED ORDER — METOPROLOL TARTRATE 50 MG PO TABS: 50.0000 mg | ORAL_TABLET | Freq: Two times a day (BID) | ORAL | Status: DC

## 2016-03-14 MED ORDER — RISPERIDONE MICROSPHERES 25 MG IM SUSR: 25.0000 mg | INTRAMUSCULAR | Status: DC

## 2016-03-14 MED ORDER — HEPARIN SODIUM (PORCINE) 1000 UNIT/ML DIALYSIS: 20.0000 [IU]/kg | INTRAMUSCULAR | Status: DC | PRN
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
## 2016-03-14 NOTE — ED Triage Notes (Signed)
Pt here for dialysis 
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
## 2016-03-17 ENCOUNTER — Encounter (HOSPITAL_COMMUNITY): Payer: Self-pay | Admitting: Emergency Medicine

## 2016-03-17 ENCOUNTER — Observation Stay (HOSPITAL_COMMUNITY)
Admission: EM | Admit: 2016-03-17 | Discharge: 2016-03-17 | Disposition: A | Discharge status: Home / Self Care | Payer: Medicare Other | Attending: Family Medicine | Admitting: Family Medicine

## 2016-03-17 DIAGNOSIS — Z7952 Long term (current) use of systemic steroids: Secondary | ICD-10-CM | POA: Diagnosis not present

## 2016-03-17 DIAGNOSIS — I120 Hypertensive chronic kidney disease with stage 5 chronic kidney disease or end stage renal disease: Secondary | ICD-10-CM | POA: Diagnosis not present

## 2016-03-17 DIAGNOSIS — M899 Disorder of bone, unspecified: Secondary | ICD-10-CM | POA: Insufficient documentation

## 2016-03-17 DIAGNOSIS — F250 Schizoaffective disorder, bipolar type: Secondary | ICD-10-CM | POA: Insufficient documentation

## 2016-03-17 DIAGNOSIS — I5022 Chronic systolic (congestive) heart failure: Secondary | ICD-10-CM | POA: Insufficient documentation

## 2016-03-17 DIAGNOSIS — M3214 Glomerular disease in systemic lupus erythematosus: Secondary | ICD-10-CM | POA: Diagnosis not present

## 2016-03-17 DIAGNOSIS — N186 End stage renal disease: Secondary | ICD-10-CM | POA: Diagnosis not present

## 2016-03-17 DIAGNOSIS — D631 Anemia in chronic kidney disease: Secondary | ICD-10-CM | POA: Diagnosis not present

## 2016-03-17 DIAGNOSIS — F17210 Nicotine dependence, cigarettes, uncomplicated: Secondary | ICD-10-CM | POA: Insufficient documentation

## 2016-03-17 DIAGNOSIS — I132 Hypertensive heart and chronic kidney disease with heart failure and with stage 5 chronic kidney disease, or end stage renal disease: Secondary | ICD-10-CM | POA: Diagnosis not present

## 2016-03-17 DIAGNOSIS — Z992 Dependence on renal dialysis: Secondary | ICD-10-CM

## 2016-03-17 DIAGNOSIS — Z9119 Patient's noncompliance with other medical treatment and regimen: Secondary | ICD-10-CM | POA: Diagnosis not present

## 2016-03-17 DIAGNOSIS — Z79899 Other long term (current) drug therapy: Secondary | ICD-10-CM | POA: Insufficient documentation

## 2016-03-17 DIAGNOSIS — Z7982 Long term (current) use of aspirin: Secondary | ICD-10-CM | POA: Diagnosis not present

## 2016-03-17 DIAGNOSIS — I10 Essential (primary) hypertension: Secondary | ICD-10-CM | POA: Diagnosis not present

## 2016-03-17 LAB — RENAL FUNCTION PANEL
ALBUMIN: 3.2 g/dL — AB (ref 3.5–5.0)
ANION GAP: 13 (ref 5–15)
BUN: 47 mg/dL — AB (ref 6–20)
CALCIUM: 9.3 mg/dL (ref 8.9–10.3)
CO2: 25 mmol/L (ref 22–32)
Chloride: 97 mmol/L — ABNORMAL LOW (ref 101–111)
Creatinine, Ser: 7.74 mg/dL — ABNORMAL HIGH (ref 0.44–1.00)
GFR calc Af Amer: 7 mL/min — ABNORMAL LOW (ref >60)
GFR, EST NON AFRICAN AMERICAN: 6 mL/min — AB (ref >60)
GLUCOSE: 93 mg/dL (ref 65–99)
POTASSIUM: 4.3 mmol/L (ref 3.5–5.1)
Phosphorus: 5.9 mg/dL — ABNORMAL HIGH (ref 2.5–4.6)
SODIUM: 135 mmol/L (ref 135–145)

## 2016-03-17 LAB — CBC
HEMATOCRIT: 27.7 % — AB (ref 36.0–46.0)
HEMOGLOBIN: 8.6 g/dL — AB (ref 12.0–15.0)
MCH: 26.4 pg (ref 26.0–34.0)
MCHC: 31 g/dL (ref 30.0–36.0)
MCV: 85 fL (ref 78.0–100.0)
Platelets: 160 10*3/uL (ref 150–400)
RBC: 3.26 MIL/uL — ABNORMAL LOW (ref 3.87–5.11)
RDW: 19.7 % — AB (ref 11.5–15.5)
WBC: 3.8 10*3/uL — AB (ref 4.0–10.5)

## 2016-03-17 MED ORDER — EPOETIN ALFA 10000 UNIT/ML IJ SOLN
10000.0000 [IU] | INTRAMUSCULAR | Status: DC
  Administered 2016-03-17: 10000 [IU] via SUBCUTANEOUS
  Filled 2016-03-17 (×2): qty 1

## 2016-03-17 MED ORDER — EPOETIN ALFA 20000 UNIT/ML IJ SOLN: 14000.0000 [IU] | INTRAMUSCULAR | Status: DC

## 2016-03-17 MED ORDER — LIDOCAINE-PRILOCAINE 2.5-2.5 % EX CREA: 1.0000 "application " | TOPICAL_CREAM | CUTANEOUS | Status: DC | PRN

## 2016-03-17 MED ORDER — DOXERCALCIFEROL 4 MCG/2ML IV SOLN
5.0000 ug | INTRAVENOUS | Status: DC
  Administered 2016-03-17: 5 ug via INTRAVENOUS

## 2016-03-17 MED ORDER — PENTAFLUOROPROP-TETRAFLUOROETH EX AERO: 1.0000 "application " | INHALATION_SPRAY | CUTANEOUS | Status: DC | PRN

## 2016-03-17 MED ORDER — METOPROLOL TARTRATE 50 MG PO TABS
50.0000 mg | ORAL_TABLET | Freq: Two times a day (BID) | ORAL | Status: DC
  Administered 2016-03-17: 50 mg via ORAL
  Filled 2016-03-17: qty 1

## 2016-03-17 MED ORDER — HEPARIN SODIUM (PORCINE) 1000 UNIT/ML DIALYSIS: 20.0000 [IU]/kg | INTRAMUSCULAR | Status: DC | PRN

## 2016-03-17 MED ORDER — HEPARIN SODIUM (PORCINE) 5000 UNIT/ML IJ SOLN: 5000.0000 [IU] | Freq: Three times a day (TID) | INTRAMUSCULAR | Status: DC

## 2016-03-17 MED ORDER — DIPHENHYDRAMINE HCL 25 MG PO CAPS: 25.0000 mg | ORAL_CAPSULE | Freq: Four times a day (QID) | ORAL | Status: DC | PRN

## 2016-03-17 MED ORDER — ALTEPLASE 2 MG IJ SOLR: 2.0000 mg | Freq: Once | INTRAMUSCULAR | Status: DC | PRN

## 2016-03-17 MED ORDER — HEPARIN SODIUM (PORCINE) 1000 UNIT/ML DIALYSIS
1000.0000 [IU] | INTRAMUSCULAR | Status: DC | PRN
  Filled 2016-03-17: qty 1

## 2016-03-17 MED ORDER — SEVELAMER CARBONATE 800 MG PO TABS
3200.0000 mg | ORAL_TABLET | Freq: Three times a day (TID) | ORAL | Status: DC
  Administered 2016-03-17: 3200 mg via ORAL

## 2016-03-17 MED ORDER — SODIUM CHLORIDE 0.9 % IV SOLN: 100.0000 mL | INTRAVENOUS | Status: DC | PRN

## 2016-03-17 MED ORDER — LIDOCAINE HCL (PF) 1 % IJ SOLN: 5.0000 mL | INTRAMUSCULAR | Status: DC | PRN

## 2016-03-17 MED ORDER — EPOETIN ALFA 4000 UNIT/ML IJ SOLN
4000.0000 [IU] | INTRAMUSCULAR | Status: DC
  Administered 2016-03-17: 4000 [IU] via SUBCUTANEOUS
  Filled 2016-03-17 (×2): qty 1

## 2016-03-17 MED ORDER — SODIUM CHLORIDE 0.9 % IV SOLN
125.0000 mg | INTRAVENOUS | Status: DC
  Administered 2016-03-17: 125 mg via INTRAVENOUS
  Filled 2016-03-17: qty 5

## 2016-03-17 NOTE — ED Notes (Signed)
Pt to dialysis.
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
## 2016-03-17 NOTE — ED Triage Notes (Signed)
Patient here for dialysis. States last dialysis was Friday. No complaints at this time.

## 2016-03-17 NOTE — ED Notes (Signed)
Previous charge [facility] representative reported that pt had left dialysis after receiving treatment and refused to stay for admission, Rip Harbour RN spoke with Cherlyn Roberts who advised to discharge pt from epic,
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
## 2016-03-19 ENCOUNTER — Encounter (HOSPITAL_COMMUNITY): Payer: Self-pay

## 2016-03-19 ENCOUNTER — Observation Stay (HOSPITAL_COMMUNITY)
Admission: EM | Admit: 2016-03-19 | Discharge: 2016-03-19 | Disposition: A | Discharge status: Home / Self Care | Payer: Medicare Other | Attending: Emergency Medicine | Admitting: Emergency Medicine

## 2016-03-19 DIAGNOSIS — Z7982 Long term (current) use of aspirin: Secondary | ICD-10-CM | POA: Diagnosis not present

## 2016-03-19 DIAGNOSIS — N186 End stage renal disease: Secondary | ICD-10-CM | POA: Insufficient documentation

## 2016-03-19 DIAGNOSIS — I5022 Chronic systolic (congestive) heart failure: Secondary | ICD-10-CM | POA: Diagnosis not present

## 2016-03-19 DIAGNOSIS — G8929 Other chronic pain: Secondary | ICD-10-CM | POA: Diagnosis not present

## 2016-03-19 DIAGNOSIS — D631 Anemia in chronic kidney disease: Secondary | ICD-10-CM | POA: Insufficient documentation

## 2016-03-19 DIAGNOSIS — M3214 Glomerular disease in systemic lupus erythematosus: Secondary | ICD-10-CM | POA: Insufficient documentation

## 2016-03-19 DIAGNOSIS — F17210 Nicotine dependence, cigarettes, uncomplicated: Secondary | ICD-10-CM | POA: Insufficient documentation

## 2016-03-19 DIAGNOSIS — E875 Hyperkalemia: Secondary | ICD-10-CM | POA: Diagnosis not present

## 2016-03-19 DIAGNOSIS — Z9114 Patient's other noncompliance with medication regimen: Secondary | ICD-10-CM | POA: Diagnosis not present

## 2016-03-19 DIAGNOSIS — I132 Hypertensive heart and chronic kidney disease with heart failure and with stage 5 chronic kidney disease, or end stage renal disease: Secondary | ICD-10-CM | POA: Diagnosis not present

## 2016-03-19 DIAGNOSIS — I120 Hypertensive chronic kidney disease with stage 5 chronic kidney disease or end stage renal disease: Secondary | ICD-10-CM | POA: Diagnosis not present

## 2016-03-19 DIAGNOSIS — N2581 Secondary hyperparathyroidism of renal origin: Secondary | ICD-10-CM | POA: Diagnosis not present

## 2016-03-19 DIAGNOSIS — F250 Schizoaffective disorder, bipolar type: Secondary | ICD-10-CM | POA: Diagnosis not present

## 2016-03-19 DIAGNOSIS — Z992 Dependence on renal dialysis: Secondary | ICD-10-CM | POA: Insufficient documentation

## 2016-03-19 LAB — CBC WITH DIFFERENTIAL/PLATELET
Basophils Absolute: 0 10*3/uL (ref 0.0–0.1)
Basophils Relative: 0 %
Eosinophils Absolute: 0 10*3/uL (ref 0.0–0.7)
Eosinophils Relative: 0 %
HCT: 30 % — ABNORMAL LOW (ref 36.0–46.0)
Hemoglobin: 9.3 g/dL — ABNORMAL LOW (ref 12.0–15.0)
Lymphocytes Relative: 20 %
Lymphs Abs: 1.7 10*3/uL (ref 0.7–4.0)
MCH: 27.1 pg (ref 26.0–34.0)
MCHC: 31 g/dL (ref 30.0–36.0)
MCV: 87.5 fL (ref 78.0–100.0)
Monocytes Absolute: 0.7 10*3/uL (ref 0.1–1.0)
Monocytes Relative: 9 %
Neutro Abs: 5.9 10*3/uL (ref 1.7–7.7)
Neutrophils Relative %: 71 %
Platelets: 206 10*3/uL (ref 150–400)
RBC: 3.43 MIL/uL — ABNORMAL LOW (ref 3.87–5.11)
RDW: 20.6 % — ABNORMAL HIGH (ref 11.5–15.5)
WBC: 8.3 10*3/uL (ref 4.0–10.5)

## 2016-03-19 LAB — RENAL FUNCTION PANEL
Albumin: 3.2 g/dL — ABNORMAL LOW (ref 3.5–5.0)
Anion gap: 17 — ABNORMAL HIGH (ref 5–15)
BUN: 52 mg/dL — ABNORMAL HIGH (ref 6–20)
CO2: 21 mmol/L — ABNORMAL LOW (ref 22–32)
Calcium: 9.4 mg/dL (ref 8.9–10.3)
Chloride: 97 mmol/L — ABNORMAL LOW (ref 101–111)
Creatinine, Ser: 9.74 mg/dL — ABNORMAL HIGH (ref 0.44–1.00)
GFR calc Af Amer: 5 mL/min — ABNORMAL LOW (ref >60)
GFR calc non Af Amer: 5 mL/min — ABNORMAL LOW (ref >60)
Glucose, Bld: 76 mg/dL (ref 65–99)
Phosphorus: 6.7 mg/dL — ABNORMAL HIGH (ref 2.5–4.6)
Potassium: 5.8 mmol/L — ABNORMAL HIGH (ref 3.5–5.1)
Sodium: 135 mmol/L (ref 135–145)

## 2016-03-19 MED ORDER — ALTEPLASE 2 MG IJ SOLR
2.0000 mg | Freq: Once | INTRAMUSCULAR | Status: DC | PRN
  Filled 2016-03-19: qty 2

## 2016-03-19 MED ORDER — PENTAFLUOROPROP-TETRAFLUOROETH EX AERO: 1.0000 "application " | INHALATION_SPRAY | CUTANEOUS | Status: DC | PRN

## 2016-03-19 MED ORDER — LIDOCAINE-PRILOCAINE 2.5-2.5 % EX CREA: 1.0000 "application " | TOPICAL_CREAM | CUTANEOUS | Status: DC | PRN

## 2016-03-19 MED ORDER — HEPARIN SODIUM (PORCINE) 1000 UNIT/ML DIALYSIS
20.0000 [IU]/kg | INTRAMUSCULAR | Status: DC | PRN
  Filled 2016-03-19: qty 2

## 2016-03-19 MED ORDER — SODIUM CHLORIDE 0.9 % IV SOLN: 100.0000 mL | INTRAVENOUS | Status: DC | PRN

## 2016-03-19 MED ORDER — DOXERCALCIFEROL 4 MCG/2ML IV SOLN
4.0000 ug | INTRAVENOUS | Status: DC
  Administered 2016-03-19: 4 ug via INTRAVENOUS
  Filled 2016-03-19 (×2): qty 2

## 2016-03-19 MED ORDER — LIDOCAINE HCL (PF) 1 % IJ SOLN: 5.0000 mL | INTRAMUSCULAR | Status: DC | PRN

## 2016-03-19 MED ORDER — SEVELAMER CARBONATE 800 MG PO TABS
800.0000 mg | ORAL_TABLET | Freq: Three times a day (TID) | ORAL | Status: DC
Start: 2016-03-19 — End: 2016-03-19

## 2016-03-19 MED ORDER — EPOETIN ALFA 4000 UNIT/ML IJ SOLN
4000.0000 [IU] | INTRAMUSCULAR | Status: DC
  Administered 2016-03-19: 4000 [IU] via SUBCUTANEOUS
  Filled 2016-03-19 (×2): qty 1

## 2016-03-19 MED ORDER — HEPARIN SODIUM (PORCINE) 1000 UNIT/ML DIALYSIS
1000.0000 [IU] | INTRAMUSCULAR | Status: DC | PRN
  Filled 2016-03-19: qty 1

## 2016-03-19 MED ORDER — SEVELAMER CARBONATE 800 MG PO TABS
3200.0000 mg | ORAL_TABLET | Freq: Three times a day (TID) | ORAL | Status: DC
  Administered 2016-03-19: 3200 mg via ORAL
  Filled 2016-03-19 (×7): qty 4
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
## 2016-03-19 NOTE — ED Triage Notes (Signed)
Pt here for dialysis treatment.  Last treatment was Monday.  Denies any complaints.

## 2016-03-19 NOTE — Consult Note (Addendum)
Sara [name] MRN: [id number] DOB/AGE: December 27, 1980 35 y.o. Primary Care Physician:Pcp Not In System Admit date: 03/19/2016 Chief Complaint:  Chief Complaint  Patient presents with  . dialysis   HPI: Pt is 35 year old female with past medical hx of ESRD who was admitted with c/o dyspnea," I need dialysis "  HPI dates 2 days ago as pt came to ER asking for her dialysis. Pt says " I need my dialysis ,My last dialysis was on monday ." No c/o fever/cough/chills NO c/o nausea/vomiting. NO c/o abdominal pain. No c/o hematuria . NO c/o syncope.    Past Medical History:  Diagnosis Date  . Acute myopericarditis    hx/notes 10/09/2014  . Bipolar disorder ([place])    [name] 10/09/2014  . CHF (congestive heart failure) ([place])    systolic/notes 123XX123  . Chronic anemia    [name] 10/09/2014  . Current use of steroid medication 12/21/2015  . ESRD (end stage renal disease) on dialysis [facility])    "[name]; [facility]" (10/09/2014)  . History of blood transfusion    "this is probably my 3rd" (10/09/2014)  . Hypertension   . Low back pain   . Lupus    lupus w nephritis  . 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 Non-compliant patient   . Positive ANA (antinuclear antibody) 08/16/2012  . Positive Smith antibody 08/16/2012  . Pregnancy   . Psychosis   . Schizoaffective disorder, bipolar type ([place]) 11/20/2014  . Schizophrenia ([place])    [name] 10/09/2014  . Tobacco abuse 02/20/2014        Family History  Problem Relation Age of Onset  . Drug abuse Father   . Kidney disease Father     Social History:  reports that she has been smoking Cigarettes.  She has a 2.00 pack-year smoking history. She has never used smokeless tobacco. She reports that she does not drink alcohol or use drugs.   Allergies:  Allergies  Allergen Reactions  . Ativan [Lorazepam] Swelling and Other (See Comments)    Reaction:  Difficulty speaking/slurred speech   . Geodon [Ziprasidone Hcl] Itching and Swelling    Reaction:  Tongue  swelling   . Keflex [Cephalexin] Swelling and Other (See Comments)    Reaction:  Tongue swelling/pt is unable to talk   . Haldol [Haloperidol Lactate] Swelling and Other (See Comments)    Reaction:  Tongue swelling -- 05/31/15  Pt is able to take with Benadryl.    . Other Itching and Other (See Comments)    Pt states that she is allergic to wool.      (Not in a hospital admission)     [id number] from the symptoms mentioned above,there are no other symptoms referable to all systems reviewed.      Physical Exam: Vital signs in last 24 hours: Temp:  [97.8 F (36.6 C)] 97.8 F (36.6 C) (12/13 0717) Pulse Rate:  [94-109] 109 (12/13 0800) Resp:  [16-18] 16 (12/13 0800) BP: (149-162)/(114-124) 149/114 (12/13 0800) SpO2:  [95 %-100 %] 95 % (12/13 0800) Weight:  [150 lb (68 kg)] 150 lb (68 kg) (12/13 0718) Weight change:     Intake/Output from previous day: No intake/output data recorded. No intake/output data recorded.   Physical Exam: General- pt is awake,alert, follows comands Resp- No acute REsp distress, Minimal ronchi+  CVS- S1S2 regular in rate and rhythm, NO rubs  GIT- BS+, soft, NT, ND EXT- 1+ LE Edema, NO Cyanosis CNS- CN 2-12 grossly intact. Moving all 4 extremities Access- AVF+  Lab Results:  CBC    Component Value Date/Time   WBC 3.8 (L) 03/17/2016 1001   RBC 3.26 (L) 03/17/2016 1001   HGB 8.6 (L) 03/17/2016 1001   HCT 27.7 (L) 03/17/2016 1001   PLT 160 03/17/2016 1001   MCV 85.0 03/17/2016 1001   MCH 26.4 03/17/2016 1001   MCHC 31.0 03/17/2016 1001   RDW 19.7 (H) 03/17/2016 1001   LYMPHSABS 1.6 03/12/2016 1033   MONOABS 1.0 03/12/2016 1033   EOSABS 0.1 03/12/2016 1033   BASOSABS 0.0 03/12/2016 1033      BMET BMP Latest Ref Rng & Units 03/17/2016 03/14/2016 03/12/2016  Glucose 65 - 99 mg/dL 93 69 101(H)  BUN 6 - 20 mg/dL 47(H) 43(H) 46(H)  Creatinine 0.44 - 1.00 mg/dL 7.74(H) 8.23(H) 8.10(H)  Sodium 135 - 145 mmol/L 135 133(L) 137   Potassium 3.5 - 5.1 mmol/L 4.3 4.3 4.7  Chloride 101 - 111 mmol/L 97(L) 94(L) 98(L)  CO2 22 - 32 mmol/L 25 24 26   Calcium 8.9 - 10.3 mg/dL 9.3 9.1 9.3         Lab Results  Component Value Date   PTH 1,021 (H) 02/29/2016   PTH Comment 02/29/2016   CALCIUM 9.3 03/17/2016   CAION 1.10 (L) 02/23/2016   PHOS 5.9 (H) 03/17/2016      Impression: 1)Renal  ESRD on HD                Pt is not on regular  Schedule sec to her complaince/adherence issues                Will dialyze pt today  2)HTN Bp is not at goal Hx of non adherence to medical tx  3)Anemia In ESRD the goal for HGb is 9--11. 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   Not at goal sec to non adherence to hd tx   Phosphorus not at goal.  on binders  5)Psych .  Hx of   psycosis Schizophrenia Primary MD following  6)Electrolytes  Hx of Hyperkalemia      Sec to non adherence to HD   Hx of Hyponatremia   7)Acid base Co2  at goal    Plan:  Will dialyze today Will use 2 k bath Will keep on epo-4000 units  Will keep on hectrol-19mcg will dialyze for 4hrs ( if pt stays )  Will try to take 3 liters off if possible  I again educated pt about need to adhere to medical tx.  Addendum Pt seen on HD   Sara [name] 03/19/2016, 9:12 AM
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
## 2016-03-19 NOTE — Discharge Summary (Signed)
Patient left AMA today, taking herself off hemodialysis approximately after 2 hours and 35 minutes.  Domingo Mend, MD [facility] Pager: [phone]

## 2016-03-20 MED FILL — Epoetin Alfa Inj 10000 Unit/ML: INTRAMUSCULAR | Qty: 1 | Status: AC

## 2016-03-20 MED FILL — Epoetin Alfa Inj 4000 Unit/ML: INTRAMUSCULAR | Qty: 1 | Status: AC

## 2016-03-21 ENCOUNTER — Encounter (HOSPITAL_COMMUNITY): Payer: Self-pay | Admitting: Emergency Medicine

## 2016-03-21 ENCOUNTER — Observation Stay (HOSPITAL_COMMUNITY)
Admission: EM | Admit: 2016-03-21 | Discharge: 2016-03-21 | Disposition: A | Discharge status: Home / Self Care | Payer: Medicare Other | Attending: Internal Medicine | Admitting: Internal Medicine

## 2016-03-21 DIAGNOSIS — F17210 Nicotine dependence, cigarettes, uncomplicated: Secondary | ICD-10-CM | POA: Insufficient documentation

## 2016-03-21 DIAGNOSIS — N186 End stage renal disease: Secondary | ICD-10-CM | POA: Diagnosis not present

## 2016-03-21 DIAGNOSIS — I5022 Chronic systolic (congestive) heart failure: Secondary | ICD-10-CM | POA: Insufficient documentation

## 2016-03-21 DIAGNOSIS — Z992 Dependence on renal dialysis: Secondary | ICD-10-CM

## 2016-03-21 DIAGNOSIS — Z7952 Long term (current) use of systemic steroids: Secondary | ICD-10-CM | POA: Diagnosis not present

## 2016-03-21 DIAGNOSIS — I132 Hypertensive heart and chronic kidney disease with heart failure and with stage 5 chronic kidney disease, or end stage renal disease: Secondary | ICD-10-CM | POA: Diagnosis not present

## 2016-03-21 DIAGNOSIS — Z7982 Long term (current) use of aspirin: Secondary | ICD-10-CM | POA: Insufficient documentation

## 2016-03-21 DIAGNOSIS — I10 Essential (primary) hypertension: Secondary | ICD-10-CM | POA: Diagnosis not present

## 2016-03-21 DIAGNOSIS — I120 Hypertensive chronic kidney disease with stage 5 chronic kidney disease or end stage renal disease: Secondary | ICD-10-CM | POA: Diagnosis not present

## 2016-03-21 DIAGNOSIS — Z79899 Other long term (current) drug therapy: Secondary | ICD-10-CM | POA: Diagnosis not present

## 2016-03-21 DIAGNOSIS — E875 Hyperkalemia: Secondary | ICD-10-CM | POA: Diagnosis not present

## 2016-03-21 DIAGNOSIS — D631 Anemia in chronic kidney disease: Secondary | ICD-10-CM | POA: Diagnosis not present

## 2016-03-21 MED ORDER — DIPHENHYDRAMINE HCL 25 MG PO CAPS
ORAL_CAPSULE | ORAL | Status: AC
  Administered 2016-03-21: 25 mg via ORAL
  Filled 2016-03-21: qty 1

## 2016-03-21 MED ORDER — SODIUM CHLORIDE 0.9 % IV SOLN: 100.0000 mL | INTRAVENOUS | Status: DC | PRN

## 2016-03-21 MED ORDER — PENTAFLUOROPROP-TETRAFLUOROETH EX AERO
1.0000 "application " | INHALATION_SPRAY | CUTANEOUS | Status: DC | PRN
  Administered 2016-03-21: 1 via TOPICAL

## 2016-03-21 MED ORDER — HEPARIN SODIUM (PORCINE) 1000 UNIT/ML DIALYSIS
20.0000 [IU]/kg | INTRAMUSCULAR | Status: DC | PRN
  Administered 2016-03-21: 1400 [IU] via INTRAVENOUS_CENTRAL
  Filled 2016-03-21 (×2): qty 2

## 2016-03-21 MED ORDER — DIPHENHYDRAMINE HCL 25 MG PO CAPS
25.0000 mg | ORAL_CAPSULE | Freq: Three times a day (TID) | ORAL | Status: DC | PRN
  Administered 2016-03-21: 25 mg via ORAL

## 2016-03-21 MED ORDER — TRAMADOL HCL 50 MG PO TABS
ORAL_TABLET | ORAL | Status: AC
  Administered 2016-03-21: 50 mg via ORAL
  Filled 2016-03-21: qty 1

## 2016-03-21 MED ORDER — DOXERCALCIFEROL 4 MCG/2ML IV SOLN
INTRAVENOUS | Status: AC
  Administered 2016-03-21: 4 ug via INTRAVENOUS
  Filled 2016-03-21: qty 2

## 2016-03-21 MED ORDER — LIDOCAINE HCL (PF) 1 % IJ SOLN: 5.0000 mL | INTRAMUSCULAR | Status: DC | PRN

## 2016-03-21 MED ORDER — TRAMADOL HCL 50 MG PO TABS
50.0000 mg | ORAL_TABLET | Freq: Two times a day (BID) | ORAL | Status: DC | PRN
  Administered 2016-03-21: 50 mg via ORAL

## 2016-03-21 MED ORDER — PENTAFLUOROPROP-TETRAFLUOROETH EX AERO
INHALATION_SPRAY | CUTANEOUS | Status: AC
  Administered 2016-03-21: 1 via TOPICAL
  Filled 2016-03-21: qty 103.5

## 2016-03-21 MED ORDER — LIDOCAINE-PRILOCAINE 2.5-2.5 % EX CREA: 1.0000 "application " | TOPICAL_CREAM | CUTANEOUS | Status: DC | PRN

## 2016-03-21 MED ORDER — HEPARIN SODIUM (PORCINE) 1000 UNIT/ML IJ SOLN
INTRAMUSCULAR | Status: AC
  Administered 2016-03-21: 1400 [IU] via INTRAVENOUS_CENTRAL
  Filled 2016-03-21: qty 2

## 2016-03-21 MED ORDER — DOXERCALCIFEROL 4 MCG/2ML IV SOLN
4.0000 ug | INTRAVENOUS | Status: DC
  Administered 2016-03-21: 4 ug via INTRAVENOUS
  Filled 2016-03-21 (×2): qty 2

## 2016-03-21 MED ORDER — EPOETIN ALFA 4000 UNIT/ML IJ SOLN
4000.0000 [IU] | INTRAMUSCULAR | Status: DC
  Administered 2016-03-21: 4000 [IU] via SUBCUTANEOUS
  Filled 2016-03-21 (×2): qty 1

## 2016-03-21 NOTE — ED Notes (Signed)
Reported given to [employer].  Pt taken up to RM 305 by NT. VSS. Belongings taken with patient.

## 2016-03-21 NOTE — Procedures (Signed)
   HEMODIALYSIS TREATMENT NOTE:  3.25 hour low-heparin dialysis completed via right upper arm AVF (15g ante/retrograde). Goal NOT met: only 2.6 liters removed as pt requested to end HD session 45 minutes early/AMA.  All blood was returned and hemostasis was achieved within 10 minutes.  Pt verbalized intent to leave hospital from HD unit.  Standing BP 158/99, ambulatory with steady gait.  Renal diet and fluid restriction were encouraged.     , RN, CDN 
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
## 2016-03-21 NOTE — Discharge Summary (Signed)
Physician Discharge Summary  Sara [name] [name] DOB: 08-Jun-1980 DOA: 03/21/2016  PCP: Pcp Not In System  Admit date: 03/21/2016 Discharge date: 03/21/2016  Time spent: 30 minutes  Recommendations for Outpatient Follow-up:  -Will be discharged home today in satisfactory condition.   Discharge Diagnoses:  Active Problems:   ESRD (end stage renal disease) on dialysis ([place])   ESRD (end stage renal disease) ([place])   Discharge Condition: Stable and improved  Filed Weights   03/21/16 0726  Weight: 68 kg (150 lb)    History of present illness:  Here for scheduled HD.  Hospital Course:   ESRD on HD -Received HD as scheduled without complications.  Procedures:  HD   Consultations:  Nephrology  Discharge Instructions  Discharge Instructions    Diet - low sodium heart healthy    Complete by:  As directed    Increase activity slowly    Complete by:  As directed      Allergies as of 03/21/2016      Reactions   Ativan [lorazepam] Swelling, Other (See Comments)   Reaction:  Difficulty speaking/slurred speech    Geodon [ziprasidone Hcl] Itching, Swelling   Reaction:  Tongue swelling    Keflex [cephalexin] Swelling, Other (See Comments)   Reaction:  Tongue swelling/pt is unable to talk    Haldol [haloperidol Lactate] Swelling, Other (See Comments)   Reaction:  Tongue swelling -- 05/31/15  Pt is able to take with Benadryl.     Other Itching, Other (See Comments)   Pt states that she is allergic to wool.       Medication List    TAKE these medications   acetaminophen 500 MG tablet Commonly known as:  TYLENOL Take 1,000 mg by mouth every 6 (six) hours as needed for mild pain, moderate pain or headache.   aspirin-acetaminophen-caffeine 250-250-65 MG tablet Commonly known as:  EXCEDRIN MIGRAINE Take 2 tablets by mouth every 6 (six) hours as needed for headache.   metoprolol 50 MG tablet Commonly known as:  LOPRESSOR Take 1 tablet (50 mg total)  by mouth 2 (two) times daily.   predniSONE 20 MG tablet Commonly known as:  DELTASONE Take 1 tablet (20 mg total) by mouth daily with breakfast.   risperiDONE microspheres 25 MG injection Commonly known as:  RISPERDAL CONSTA Inject 25 mg into the muscle every 14 (fourteen) days.   sevelamer carbonate 800 MG tablet Commonly known as:  RENVELA Take 4 tablets (3,200 mg total) by mouth 3 (three) times daily with meals.   traMADol 50 MG tablet Commonly known as:  ULTRAM Take 1 tablet (50 mg total) by mouth every 6 (six) hours as needed for moderate pain.      Allergies  Allergen Reactions  . Ativan [Lorazepam] Swelling and Other (See Comments)    Reaction:  Difficulty speaking/slurred speech   . Geodon [Ziprasidone Hcl] Itching and Swelling    Reaction:  Tongue swelling   . Keflex [Cephalexin] Swelling and Other (See Comments)    Reaction:  Tongue swelling/pt is unable to talk   . Haldol [Haloperidol Lactate] Swelling and Other (See Comments)    Reaction:  Tongue swelling -- 05/31/15  Pt is able to take with Benadryl.    . Other Itching and Other (See Comments)    Pt states that she is allergic to wool.       The results of significant diagnostics from this hospitalization (including imaging, microbiology, ancillary and laboratory) are listed below for reference.  Significant Diagnostic Studies: Dg Chest 2 View  Result Date: 02/23/2016 CLINICAL DATA:  Cough and congestion for the past 4 days. History of end-stage renal disease, on dialysis EXAM: CHEST  2 VIEW COMPARISON:  02/20/2016; 10/29/2015; 07/02/2015 FINDINGS: Grossly unchanged enlarged cardiac silhouette and mediastinal contours. The pulmonary vasculature appears less distinct than present examination with cephalization of flow. There is minimal pleural parenchymal thickening about the bilateral major fissures as well as the right minor fissure. Worsening bibasilar opacities, favored to represent atelectasis. No discrete  focal airspace opacities. No pleural effusion or pneumothorax. No acute osseus abnormalities. Chronic deformities involving the distal end of the bilateral clavicles. Old fracture involving the posterior aspect of the right third rib. IMPRESSION: Cardiomegaly with findings worrisome for superimposed pulmonary edema. No pleural effusion. Electronically Signed   By: Sandi Mariscal M.D.   On: 02/23/2016 09:51    Microbiology: No results found for this or any previous visit (from the past 240 hour(s)).   Labs: Basic Metabolic Panel:  Recent Labs Lab 03/14/16 1733 03/17/16 1001 03/19/16 1115  NA 133* 135 135  K 4.3 4.3 5.8*  CL 94* 97* 97*  CO2 24 25 21*  GLUCOSE 69 93 76  BUN 43* 47* 52*  CREATININE 8.23* 7.74* 9.74*  CALCIUM 9.1 9.3 9.4  PHOS 7.5* 5.9* 6.7*   Liver Function Tests:  Recent Labs Lab 03/14/16 1733 03/17/16 1001 03/19/16 1115  ALBUMIN 3.1* 3.2* 3.2*   No results for input(s): LIPASE, AMYLASE in the last 168 hours. No results for input(s): AMMONIA in the last 168 hours. CBC:  Recent Labs Lab 03/17/16 1001 03/19/16 1255  WBC 3.8* 8.3  NEUTROABS  --  5.9  HGB 8.6* 9.3*  HCT 27.7* 30.0*  MCV 85.0 87.5  PLT 160 206   Cardiac Enzymes: No results for input(s): CKTOTAL, CKMB, CKMBINDEX, TROPONINI in the last 168 hours. BNP: BNP (last 3 results) No results for input(s): BNP in the last 8760 hours.  ProBNP (last 3 results) No results for input(s): PROBNP in the last 8760 hours.  CBG: No results for input(s): GLUCAP in the last 168 hours.     SignedLelon Frohlich  [facility] Pager: [phone] 03/21/2016, 3:08 PM

## 2016-03-21 NOTE — Consult Note (Signed)
Sara [name] MRN: [id number] DOB/AGE: 05/26/1980 35 y.o. Primary Care Physician:Pcp Not In System Admit date: 03/21/2016 Chief Complaint:  Chief Complaint  Patient presents with  . Dialysis   HPI: Pt is 35 year old female with past medical hx of ESRD who was admitted with c/o dyspnea," I need dialysis "  HPI dates 2 days ago as pt came to ER asking for her dialysis. Pt says " I need my dialysis " No c/o fever/cough/chills NO c/o nausea/vomiting. NO c/o abdominal pain. No c/o hematuria . NO c/o syncope. I educated pt to please stay for full tx.   Past Medical History:  Diagnosis Date  . Acute myopericarditis    hx/notes 10/09/2014  . Bipolar disorder ([place])    [name] 10/09/2014  . CHF (congestive heart failure) ([place])    systolic/notes [id number]  . Chronic anemia    [name] 10/09/2014  . Current use of steroid medication 12/21/2015  . ESRD (end stage renal disease) on dialysis [facility])    "[name]; [facility]" (10/09/2014)  . History of blood transfusion    "this is probably my 3rd" (10/09/2014)  . Hypertension   . Low back pain   . Lupus    lupus w nephritis  . 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 Non-compliant patient   . Positive ANA (antinuclear antibody) 08/16/2012  . Positive Smith antibody 08/16/2012  . Pregnancy   . Psychosis   . Schizoaffective disorder, bipolar type ([place]) 11/20/2014  . Schizophrenia ([place])    [name] 10/09/2014  . Tobacco abuse 02/20/2014        Family History  Problem Relation Age of Onset  . Drug abuse Father   . Kidney disease Father     Social History:  reports that she has been smoking Cigarettes.  She has a 2.00 pack-year smoking history. She has never used smokeless tobacco. She reports that she does not drink alcohol or use drugs.   Allergies:  Allergies  Allergen Reactions  . Ativan [Lorazepam] Swelling and Other (See Comments)    Reaction:  Difficulty speaking/slurred speech   . Geodon [Ziprasidone Hcl] Itching and Swelling    Reaction:  Tongue  swelling   . Keflex [Cephalexin] Swelling and Other (See Comments)    Reaction:  Tongue swelling/pt is unable to talk   . Haldol [Haloperidol Lactate] Swelling and Other (See Comments)    Reaction:  Tongue swelling -- 05/31/15  Pt is able to take with Benadryl.    . Other Itching and Other (See Comments)    Pt states that she is allergic to wool.      (Not in a hospital admission)     [id number] from the symptoms mentioned above,there are no other symptoms referable to all systems reviewed.      Physical Exam: Vital signs in last 24 hours: Temp:  [97.6 F (36.4 C)] 97.6 F (36.4 C) (12/15 0725) Pulse Rate:  [61] 61 (12/15 0725) Resp:  [16] 16 (12/15 0725) BP: (153)/(105) 153/105 (12/15 0725) SpO2:  [100 %] 100 % (12/15 0725) Weight:  [150 lb (68 kg)] 150 lb (68 kg) (12/15 0726) Weight change:     Intake/Output from previous day: No intake/output data recorded. No intake/output data recorded.   Physical Exam: General- pt is awake,alert, follows comands Resp- No acute REsp distress, clear to auscultataion CVS- S1S2 regular in rate and rhythm, NO rubs  GIT- BS+, soft, NT, ND EXT- 1+ LE Edema, NO Cyanosis CNS- CN 2-12 grossly intact. Moving all 4 extremities Access-  AVF+    Lab Results:  CBC    Component Value Date/Time   WBC 8.3 03/19/2016 1255   RBC 3.43 (L) 03/19/2016 1255   HGB 9.3 (L) 03/19/2016 1255   HCT 30.0 (L) 03/19/2016 1255   PLT 206 03/19/2016 1255   MCV 87.5 03/19/2016 1255   MCH 27.1 03/19/2016 1255   MCHC 31.0 03/19/2016 1255   RDW 20.6 (H) 03/19/2016 1255   LYMPHSABS 1.7 03/19/2016 1255   MONOABS 0.7 03/19/2016 1255   EOSABS 0.0 03/19/2016 1255   BASOSABS 0.0 03/19/2016 1255      BMET BMP Latest Ref Rng & Units 03/19/2016 03/17/2016 03/14/2016  Glucose 65 - 99 mg/dL 76 93 69  BUN 6 - 20 mg/dL 52(H) 47(H) 43(H)  Creatinine 0.44 - 1.00 mg/dL 9.74(H) 7.74(H) 8.23(H)  Sodium 135 - 145 mmol/L 135 135 133(L)  Potassium 3.5 - 5.1  mmol/L 5.8(H) 4.3 4.3  Chloride 101 - 111 mmol/L 97(L) 97(L) 94(L)  CO2 22 - 32 mmol/L 21(L) 25 24  Calcium 8.9 - 10.3 mg/dL 9.4 9.3 9.1         Lab Results  Component Value Date   PTH 1,021 (H) 02/29/2016   PTH Comment 02/29/2016   CALCIUM 9.4 03/19/2016   CAION 1.10 (L) 02/23/2016   PHOS 6.7 (H) 03/19/2016      Impression: 1)Renal  ESRD on HD                Pt is not on regular  Schedule sec to her complaince/adherence issues                Will dialyze pt today  2)HTN Bp is not at goal Hx of non adherence to medical tx  3)Anemia In ESRD the goal for HGb is 9--11. Pt HGb is  at goal  Will keep on epo   4)CKD Mineral-Bone Disorder PTH high. Secondary Hyperparathyroidism  Present.   Not at goal sec to non adherence to hd tx   Phosphorus not at goal.  on binders  5)Psych .  Hx of   psycosis Schizophrenia Primary MD following  6)Electrolytes  Hx of Hyperkalemia      Sec to non adherence to HD   Hx of Hyponatremia   7)Acid base Co2  at goal    Plan:  Will dialyze today Will use 2 k bath Will keep on epo-4000 units  Will keep on hectrol-5mcg will dialyze for 4hrs ( if pt stays )  Will try to take 3 liters off if possible  I again educated pt about need to adhere to medical tx.     Sara [name] S 03/21/2016, 8:51 AM

## 2016-03-21 NOTE — ED Triage Notes (Signed)
Need Dialysis

## 2016-03-24 ENCOUNTER — Observation Stay (HOSPITAL_COMMUNITY)
Admission: EM | Admit: 2016-03-24 | Discharge: 2016-03-24 | Disposition: A | Discharge status: Home / Self Care | Payer: Medicare Other | Attending: Internal Medicine | Admitting: Internal Medicine

## 2016-03-24 ENCOUNTER — Encounter (HOSPITAL_COMMUNITY): Payer: Self-pay | Admitting: Emergency Medicine

## 2016-03-24 DIAGNOSIS — Z79899 Other long term (current) drug therapy: Secondary | ICD-10-CM | POA: Insufficient documentation

## 2016-03-24 DIAGNOSIS — I10 Essential (primary) hypertension: Secondary | ICD-10-CM | POA: Diagnosis present

## 2016-03-24 DIAGNOSIS — I5022 Chronic systolic (congestive) heart failure: Secondary | ICD-10-CM | POA: Insufficient documentation

## 2016-03-24 DIAGNOSIS — Z7982 Long term (current) use of aspirin: Secondary | ICD-10-CM | POA: Diagnosis not present

## 2016-03-24 DIAGNOSIS — D631 Anemia in chronic kidney disease: Secondary | ICD-10-CM | POA: Diagnosis present

## 2016-03-24 DIAGNOSIS — Z992 Dependence on renal dialysis: Secondary | ICD-10-CM | POA: Insufficient documentation

## 2016-03-24 DIAGNOSIS — I5030 Unspecified diastolic (congestive) heart failure: Secondary | ICD-10-CM | POA: Diagnosis present

## 2016-03-24 DIAGNOSIS — I132 Hypertensive heart and chronic kidney disease with heart failure and with stage 5 chronic kidney disease, or end stage renal disease: Secondary | ICD-10-CM | POA: Diagnosis not present

## 2016-03-24 DIAGNOSIS — R609 Edema, unspecified: Secondary | ICD-10-CM | POA: Diagnosis present

## 2016-03-24 DIAGNOSIS — I120 Hypertensive chronic kidney disease with stage 5 chronic kidney disease or end stage renal disease: Secondary | ICD-10-CM | POA: Diagnosis not present

## 2016-03-24 DIAGNOSIS — Z720 Tobacco use: Secondary | ICD-10-CM | POA: Diagnosis present

## 2016-03-24 DIAGNOSIS — F17210 Nicotine dependence, cigarettes, uncomplicated: Secondary | ICD-10-CM | POA: Insufficient documentation

## 2016-03-24 DIAGNOSIS — N186 End stage renal disease: Secondary | ICD-10-CM | POA: Diagnosis present

## 2016-03-24 DIAGNOSIS — F250 Schizoaffective disorder, bipolar type: Secondary | ICD-10-CM | POA: Diagnosis present

## 2016-03-24 DIAGNOSIS — F1910 Other psychoactive substance abuse, uncomplicated: Secondary | ICD-10-CM | POA: Diagnosis present

## 2016-03-24 DIAGNOSIS — M329 Systemic lupus erythematosus, unspecified: Secondary | ICD-10-CM | POA: Diagnosis present

## 2016-03-24 MED ORDER — DIPHENHYDRAMINE HCL 25 MG PO CAPS: 25.0000 mg | ORAL_CAPSULE | Freq: Four times a day (QID) | ORAL | Status: DC | PRN

## 2016-03-24 MED ORDER — ALTEPLASE 2 MG IJ SOLR: 2.0000 mg | Freq: Once | INTRAMUSCULAR | Status: DC | PRN

## 2016-03-24 MED ORDER — HEPARIN SODIUM (PORCINE) 5000 UNIT/ML IJ SOLN: 5000.0000 [IU] | Freq: Three times a day (TID) | INTRAMUSCULAR | Status: DC

## 2016-03-24 MED ORDER — DOXERCALCIFEROL 4 MCG/2ML IV SOLN: 5.0000 ug | INTRAVENOUS | Status: DC

## 2016-03-24 MED ORDER — SEVELAMER CARBONATE 800 MG PO TABS: 3200.0000 mg | ORAL_TABLET | Freq: Three times a day (TID) | ORAL | Status: DC

## 2016-03-24 MED ORDER — METOPROLOL TARTRATE 50 MG PO TABS: 50.0000 mg | ORAL_TABLET | Freq: Two times a day (BID) | ORAL | Status: DC

## 2016-03-24 MED ORDER — ASPIRIN-ACETAMINOPHEN-CAFFEINE 250-250-65 MG PO TABS: 2.0000 | ORAL_TABLET | Freq: Four times a day (QID) | ORAL | Status: DC | PRN

## 2016-03-24 MED ORDER — PREDNISONE 20 MG PO TABS: 20.0000 mg | ORAL_TABLET | Freq: Every day | ORAL | Status: DC

## 2016-03-24 MED ORDER — SODIUM CHLORIDE 0.9 % IV SOLN: 100.0000 mL | INTRAVENOUS | Status: DC | PRN

## 2016-03-24 MED ORDER — LIDOCAINE HCL (PF) 1 % IJ SOLN: 5.0000 mL | INTRAMUSCULAR | Status: DC | PRN

## 2016-03-24 MED ORDER — HEPARIN SODIUM (PORCINE) 1000 UNIT/ML DIALYSIS: 1000.0000 [IU] | INTRAMUSCULAR | Status: DC | PRN

## 2016-03-24 MED ORDER — HEPARIN SODIUM (PORCINE) 1000 UNIT/ML DIALYSIS: 20.0000 [IU]/kg | INTRAMUSCULAR | Status: DC | PRN

## 2016-03-24 MED ORDER — HYDRALAZINE HCL 20 MG/ML IJ SOLN: 5.0000 mg | Freq: Four times a day (QID) | INTRAMUSCULAR | Status: DC | PRN

## 2016-03-24 MED ORDER — TRAMADOL HCL 50 MG PO TABS: 50.0000 mg | ORAL_TABLET | Freq: Four times a day (QID) | ORAL | Status: DC | PRN

## 2016-03-24 MED ORDER — EPOETIN ALFA 20000 UNIT/ML IJ SOLN
14000.0000 [IU] | INTRAMUSCULAR | Status: DC
  Filled 2016-03-24: qty 1

## 2016-03-24 MED ORDER — RISPERIDONE MICROSPHERES 25 MG IM SUSR: 25.0000 mg | INTRAMUSCULAR | Status: DC

## 2016-03-24 MED ORDER — SEVELAMER CARBONATE 800 MG PO TABS
3200.0000 mg | ORAL_TABLET | Freq: Three times a day (TID) | ORAL | Status: DC
  Administered 2016-03-24: 3200 mg via ORAL
  Filled 2016-03-24: qty 4

## 2016-03-24 MED ORDER — LIDOCAINE-PRILOCAINE 2.5-2.5 % EX CREA: 1.0000 "application " | TOPICAL_CREAM | CUTANEOUS | Status: DC | PRN

## 2016-03-24 MED ORDER — NA FERRIC GLUC CPLX IN SUCROSE 12.5 MG/ML IV SOLN
125.0000 mg | Freq: Once | INTRAVENOUS | Status: DC
  Filled 2016-03-24: qty 10

## 2016-03-24 MED ORDER — PENTAFLUOROPROP-TETRAFLUOROETH EX AERO: 1.0000 "application " | INHALATION_SPRAY | CUTANEOUS | Status: DC | PRN

## 2016-03-24 MED ORDER — ACETAMINOPHEN 500 MG PO TABS: 1000.0000 mg | ORAL_TABLET | Freq: Four times a day (QID) | ORAL | Status: DC | PRN

## 2016-03-24 MED FILL — Doxercalciferol Inj 4 MCG/2ML (2 MCG/ML): INTRAVENOUS | Qty: 2 | Status: AC
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
## 2016-03-24 NOTE — Discharge Instructions (Signed)
Follow with Primary MD Pcp Not In System in 7 days   Get CBC, CMP, 2 view Chest X ray checked  by Primary MD or SNF MD in 5-7 days ( we routinely change or add medications that can affect your baseline labs and fluid status, therefore we recommend that you get the mentioned basic workup next visit with your PCP, your PCP may decide not to get them or add new tests based on their clinical decision)   Activity: As tolerated with Full fall precautions use walker/cane & assistance as needed   Disposition Home    Diet:   Diet renal with fluid restriction Fluid restriction: 1200 mL Fluid/day  For Heart failure patients - Check your Weight same time everyday, if you gain over 2 pounds, or you develop in leg swelling, experience more shortness of breath or chest pain, call your Primary MD immediately. Follow Cardiac Low Salt Diet and 1.5 lit/day fluid restriction.   On your next visit with your primary care physician please Get Medicines reviewed and adjusted.   Please request your Prim.MD to go over all Hospital Tests and Procedure/Radiological results at the follow up, please get all Hospital records sent to your Prim MD by signing hospital release before you go home.   If you experience worsening of your admission symptoms, develop shortness of breath, life threatening emergency, suicidal or homicidal thoughts you must seek medical attention immediately by calling 911 or calling your MD immediately  if symptoms less severe.  You Must read complete instructions/literature along with all the possible adverse reactions/side effects for all the Medicines you take and that have been prescribed to you. Take any new Medicines after you have completely understood and accpet all the possible adverse reactions/side effects.   Do not drive, operate heavy machinery, perform activities at heights, swimming or participation in water activities or provide baby sitting services if your were admitted for syncope  or siezures until you have seen by Primary MD or a Neurologist and advised to do so again.  Do not drive when taking Pain medications.    Do not take more than prescribed Pain, Sleep and Anxiety Medications  Special Instructions: If you have smoked or chewed Tobacco  in the last 2 yrs please stop smoking, stop any regular Alcohol  and or any Recreational drug use.  Wear Seat belts while driving.   Please note  You were cared for by a hospitalist during your hospital stay. If you have any questions about your discharge medications or the care you received while you were in the hospital after you are discharged, you can call the unit and asked to speak with the hospitalist on call if the hospitalist that took care of you is not available. Once you are discharged, your primary care physician will handle any further medical issues. Please note that NO REFILLS for any discharge medications will be authorized once you are discharged, as it is imperative that you return to your primary care physician (or establish a relationship with a primary care physician if you do not have one) for your aftercare needs so that they can reassess your need for medications and monitor your lab values.

## 2016-03-24 NOTE — ED Triage Notes (Signed)
Pt here for dialysis, last dialyzed on Friday

## 2016-03-24 NOTE — ED Notes (Signed)
Spoke with Angela(Dialysis) and she will be on up to [facility].
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
## 2016-03-24 NOTE — Progress Notes (Signed)
Rolled pt down to dialysis at about 3:15 PM. Received call from dialysis nurse at 3:25 PM saying that the patient got a call from her ride that they were here to pick her up. Patient said she had to leave and that this was her only ride. Patient signed AMA papers in dialysis and left the hospital.  Notified Dr. Candiss Norse of patient leaving AMA.

## 2016-03-26 ENCOUNTER — Encounter (HOSPITAL_COMMUNITY): Payer: Self-pay | Admitting: *Deleted

## 2016-03-26 ENCOUNTER — Observation Stay (HOSPITAL_COMMUNITY)
Admission: EM | Admit: 2016-03-26 | Discharge: 2016-03-26 | Disposition: A | Discharge status: Home / Self Care | Payer: Medicare Other | Attending: Internal Medicine | Admitting: Internal Medicine

## 2016-03-26 DIAGNOSIS — I132 Hypertensive heart and chronic kidney disease with heart failure and with stage 5 chronic kidney disease, or end stage renal disease: Principal | ICD-10-CM | POA: Insufficient documentation

## 2016-03-26 DIAGNOSIS — Z7952 Long term (current) use of systemic steroids: Secondary | ICD-10-CM | POA: Insufficient documentation

## 2016-03-26 DIAGNOSIS — N186 End stage renal disease: Secondary | ICD-10-CM | POA: Diagnosis not present

## 2016-03-26 DIAGNOSIS — Z7982 Long term (current) use of aspirin: Secondary | ICD-10-CM | POA: Diagnosis not present

## 2016-03-26 DIAGNOSIS — I5022 Chronic systolic (congestive) heart failure: Secondary | ICD-10-CM | POA: Insufficient documentation

## 2016-03-26 DIAGNOSIS — F17210 Nicotine dependence, cigarettes, uncomplicated: Secondary | ICD-10-CM | POA: Diagnosis not present

## 2016-03-26 DIAGNOSIS — I120 Hypertensive chronic kidney disease with stage 5 chronic kidney disease or end stage renal disease: Secondary | ICD-10-CM | POA: Diagnosis not present

## 2016-03-26 DIAGNOSIS — Z79899 Other long term (current) drug therapy: Secondary | ICD-10-CM | POA: Diagnosis not present

## 2016-03-26 DIAGNOSIS — Z992 Dependence on renal dialysis: Secondary | ICD-10-CM

## 2016-03-26 DIAGNOSIS — D631 Anemia in chronic kidney disease: Secondary | ICD-10-CM | POA: Diagnosis not present

## 2016-03-26 DIAGNOSIS — I10 Essential (primary) hypertension: Secondary | ICD-10-CM | POA: Diagnosis not present

## 2016-03-26 LAB — RENAL FUNCTION PANEL
Albumin: 3.3 g/dL — ABNORMAL LOW (ref 3.5–5.0)
Anion gap: 16 — ABNORMAL HIGH (ref 5–15)
BUN: 89 mg/dL — ABNORMAL HIGH (ref 6–20)
CO2: 21 mmol/L — ABNORMAL LOW (ref 22–32)
Calcium: 9.7 mg/dL (ref 8.9–10.3)
Chloride: 98 mmol/L — ABNORMAL LOW (ref 101–111)
Creatinine, Ser: 14.72 mg/dL — ABNORMAL HIGH (ref 0.44–1.00)
GFR calc Af Amer: 3 mL/min — ABNORMAL LOW (ref >60)
GFR calc non Af Amer: 3 mL/min — ABNORMAL LOW (ref >60)
Glucose, Bld: 105 mg/dL — ABNORMAL HIGH (ref 65–99)
Phosphorus: 9.2 mg/dL — ABNORMAL HIGH (ref 2.5–4.6)
Potassium: 5.9 mmol/L — ABNORMAL HIGH (ref 3.5–5.1)
Sodium: 135 mmol/L (ref 135–145)

## 2016-03-26 LAB — CBC
HEMATOCRIT: 28.6 % — AB (ref 36.0–46.0)
HEMOGLOBIN: 9.4 g/dL — AB (ref 12.0–15.0)
MCH: 27.4 pg (ref 26.0–34.0)
MCHC: 32.9 g/dL (ref 30.0–36.0)
MCV: 83.4 fL (ref 78.0–100.0)
Platelets: 219 10*3/uL (ref 150–400)
RBC: 3.43 MIL/uL — ABNORMAL LOW (ref 3.87–5.11)
RDW: 18.5 % — AB (ref 11.5–15.5)
WBC: 7.9 10*3/uL (ref 4.0–10.5)

## 2016-03-26 MED ORDER — SODIUM CHLORIDE 0.9 % IV SOLN: 100.0000 mL | INTRAVENOUS | Status: DC | PRN

## 2016-03-26 MED ORDER — HEPARIN SODIUM (PORCINE) 1000 UNIT/ML DIALYSIS
1000.0000 [IU] | INTRAMUSCULAR | Status: DC | PRN
  Filled 2016-03-26: qty 1

## 2016-03-26 MED ORDER — DOXERCALCIFEROL 4 MCG/2ML IV SOLN
INTRAVENOUS | Status: AC
  Administered 2016-03-26: 4 ug via INTRAVENOUS
  Filled 2016-03-26: qty 2

## 2016-03-26 MED ORDER — LIDOCAINE-PRILOCAINE 2.5-2.5 % EX CREA: 1.0000 "application " | TOPICAL_CREAM | CUTANEOUS | Status: DC | PRN

## 2016-03-26 MED ORDER — SODIUM CHLORIDE 0.9% FLUSH: 3.0000 mL | Freq: Two times a day (BID) | INTRAVENOUS | Status: DC

## 2016-03-26 MED ORDER — HEPARIN SODIUM (PORCINE) 5000 UNIT/ML IJ SOLN: 5000.0000 [IU] | Freq: Three times a day (TID) | INTRAMUSCULAR | Status: DC

## 2016-03-26 MED ORDER — PREDNISONE 20 MG PO TABS: 20.0000 mg | ORAL_TABLET | Freq: Every day | ORAL | Status: DC

## 2016-03-26 MED ORDER — METOPROLOL TARTRATE 50 MG PO TABS
50.0000 mg | ORAL_TABLET | Freq: Two times a day (BID) | ORAL | Status: DC
  Administered 2016-03-26: 50 mg via ORAL
  Filled 2016-03-26: qty 1

## 2016-03-26 MED ORDER — EPOETIN ALFA 4000 UNIT/ML IJ SOLN
4000.0000 [IU] | INTRAMUSCULAR | Status: DC
Start: 2016-03-26 — End: 2016-03-26
  Administered 2016-03-26: 4000 [IU] via SUBCUTANEOUS
  Filled 2016-03-26 (×2): qty 1

## 2016-03-26 MED ORDER — ACETAMINOPHEN 325 MG PO TABS: 650.0000 mg | ORAL_TABLET | Freq: Four times a day (QID) | ORAL | Status: DC | PRN

## 2016-03-26 MED ORDER — ASPIRIN-ACETAMINOPHEN-CAFFEINE 250-250-65 MG PO TABS
2.0000 | ORAL_TABLET | Freq: Four times a day (QID) | ORAL | Status: DC | PRN
  Filled 2016-03-26: qty 2

## 2016-03-26 MED ORDER — RISPERIDONE MICROSPHERES 25 MG IM SUSR: 25.0000 mg | INTRAMUSCULAR | Status: DC

## 2016-03-26 MED ORDER — TRAMADOL HCL 50 MG PO TABS
ORAL_TABLET | ORAL | Status: AC
  Administered 2016-03-26: 50 mg via ORAL
  Filled 2016-03-26: qty 1

## 2016-03-26 MED ORDER — ACETAMINOPHEN 500 MG PO TABS: 1000.0000 mg | ORAL_TABLET | Freq: Four times a day (QID) | ORAL | Status: DC | PRN

## 2016-03-26 MED ORDER — DOXERCALCIFEROL 4 MCG/2ML IV SOLN
4.0000 ug | INTRAVENOUS | Status: DC
  Administered 2016-03-26: 4 ug via INTRAVENOUS
  Filled 2016-03-26 (×2): qty 2

## 2016-03-26 MED ORDER — LIDOCAINE HCL (PF) 1 % IJ SOLN: 5.0000 mL | INTRAMUSCULAR | Status: DC | PRN

## 2016-03-26 MED ORDER — ACETAMINOPHEN 650 MG RE SUPP: 650.0000 mg | Freq: Four times a day (QID) | RECTAL | Status: DC | PRN

## 2016-03-26 MED ORDER — PENTAFLUOROPROP-TETRAFLUOROETH EX AERO: 1.0000 "application " | INHALATION_SPRAY | CUTANEOUS | Status: DC | PRN

## 2016-03-26 MED ORDER — SEVELAMER CARBONATE 800 MG PO TABS
3200.0000 mg | ORAL_TABLET | Freq: Three times a day (TID) | ORAL | Status: DC
  Administered 2016-03-26 (×2): 3200 mg via ORAL
  Filled 2016-03-26: qty 4

## 2016-03-26 MED ORDER — EPOETIN ALFA 4000 UNIT/ML IJ SOLN
INTRAMUSCULAR | Status: AC
  Administered 2016-03-26: 4000 [IU] via SUBCUTANEOUS
  Filled 2016-03-26: qty 1

## 2016-03-26 MED ORDER — ALTEPLASE 2 MG IJ SOLR
2.0000 mg | Freq: Once | INTRAMUSCULAR | Status: DC | PRN
  Filled 2016-03-26: qty 2

## 2016-03-26 MED ORDER — TRAMADOL HCL 50 MG PO TABS
50.0000 mg | ORAL_TABLET | Freq: Four times a day (QID) | ORAL | Status: DC | PRN
  Administered 2016-03-26: 50 mg via ORAL

## 2016-03-26 MED ORDER — HEPARIN SODIUM (PORCINE) 1000 UNIT/ML DIALYSIS
20.0000 [IU]/kg | INTRAMUSCULAR | Status: DC | PRN
  Filled 2016-03-26: qty 2

## 2016-03-26 MED ORDER — SODIUM CHLORIDE 0.9 % IV SOLN: 250.0000 mL | INTRAVENOUS | Status: DC | PRN

## 2016-03-26 MED ORDER — SODIUM CHLORIDE 0.9% FLUSH: 3.0000 mL | INTRAVENOUS | Status: DC | PRN

## 2016-03-26 NOTE — Discharge Summary (Signed)
Physician Discharge Summary  Sara [name] [name] DOB: 1980-10-16 DOA: 03/26/2016  PCP: Pcp Not In System  Admit date: 03/26/2016 Discharge date: 03/26/2016  Admitted From: Home  Disposition:  Home   Recommendations for Outpatient Follow-up:  1. Follow up with PCP in 1-2 weeks 2. Please obtain BMP/CBC in one week    Discharge Condition: Stable.  CODE STATUS: Full code.  Diet recommendation: Heart Healthy   Brief/Interim Summary: Sara [name] is a 35 y.o. female with medical history significant of ESRD, lupus, CHF, who presents for HD. She denies any new complaints. No fever.   1-ESRD; Admitted for HD. Patient had HD, stable to be discharge   2-Chronic medical problems stable. Continue with currents medications.    Discharge Diagnoses:  Active Problems:   ESRD (end stage renal disease) ([place])   End-stage renal disease needing dialysis [facility])    Discharge Instructions  Discharge Instructions    Diet - low sodium heart healthy    Complete by:  As directed    Increase activity slowly    Complete by:  As directed      Allergies as of 03/26/2016      Reactions   Ativan [lorazepam] Swelling, Other (See Comments)   Reaction:  Difficulty speaking/slurred speech    Geodon [ziprasidone Hcl] Itching, Swelling   Reaction:  Tongue swelling    Keflex [cephalexin] Swelling, Other (See Comments)   Reaction:  Tongue swelling/pt is unable to talk    Haldol [haloperidol Lactate] Swelling, Other (See Comments)   Reaction:  Tongue swelling -- 05/31/15  Pt is able to take with Benadryl.     Other Itching, Other (See Comments)   Pt states that she is allergic to wool.       Medication List    TAKE these medications   acetaminophen 500 MG tablet Commonly known as:  TYLENOL Take 1,000 mg by mouth every 6 (six) hours as needed for mild pain, moderate pain or headache.   aspirin-acetaminophen-caffeine 250-250-65 MG tablet Commonly known as:  EXCEDRIN  MIGRAINE Take 2 tablets by mouth every 6 (six) hours as needed for headache.   metoprolol 50 MG tablet Commonly known as:  LOPRESSOR Take 1 tablet (50 mg total) by mouth 2 (two) times daily.   predniSONE 20 MG tablet Commonly known as:  DELTASONE Take 1 tablet (20 mg total) by mouth daily with breakfast.   risperiDONE microspheres 25 MG injection Commonly known as:  RISPERDAL CONSTA Inject 25 mg into the muscle every 14 (fourteen) days.   sevelamer carbonate 800 MG tablet Commonly known as:  RENVELA Take 4 tablets (3,200 mg total) by mouth 3 (three) times daily with meals.   traMADol 50 MG tablet Commonly known as:  ULTRAM Take 1 tablet (50 mg total) by mouth every 6 (six) hours as needed for moderate pain.       Allergies  Allergen Reactions  . Ativan [Lorazepam] Swelling and Other (See Comments)    Reaction:  Difficulty speaking/slurred speech   . Geodon [Ziprasidone Hcl] Itching and Swelling    Reaction:  Tongue swelling   . Keflex [Cephalexin] Swelling and Other (See Comments)    Reaction:  Tongue swelling/pt is unable to talk   . Haldol [Haloperidol Lactate] Swelling and Other (See Comments)    Reaction:  Tongue swelling -- 05/31/15  Pt is able to take with Benadryl.    . Other Itching and Other (See Comments)    Pt states that she is allergic to wool.  Consultations:  Nephrology    Procedures/Studies:  No results found.   Subjective:   Discharge Exam: Vitals:   03/26/16 1540 03/26/16 1544  BP: (!) 146/101 (!) 148/101  Pulse: 90 (!) 102  Resp:    Temp:     Vitals:   03/26/16 1500 03/26/16 1530 03/26/16 1540 03/26/16 1544  BP: (!) 149/101 135/90 (!) 146/101 (!) 148/101  Pulse: (!) 101 90 90 (!) 102  Resp:      Temp:      TempSrc:      SpO2:      Weight:      Height:        General: Pt is alert, awake, not in acute distress Cardiovascular: RRR, S1/S2 +, no rubs, no gallops Respiratory: CTA bilaterally, no wheezing, no  rhonchi Abdominal: Soft, NT, ND, bowel sounds + Extremities: no edema, no cyanosis    The results of significant diagnostics from this hospitalization (including imaging, microbiology, ancillary and laboratory) are listed below for reference.     Microbiology: No results found for this or any previous visit (from the past 240 hour(s)).   Labs: BNP (last 3 results) No results for input(s): BNP in the last 8760 hours. Basic Metabolic Panel:  Recent Labs Lab 03/26/16 1117  NA 135  K 5.9*  CL 98*  CO2 21*  GLUCOSE 105*  BUN 89*  CREATININE 14.72*  CALCIUM 9.7  PHOS 9.2*   Liver Function Tests:  Recent Labs Lab 03/26/16 1117  ALBUMIN 3.3*   No results for input(s): LIPASE, AMYLASE in the last 168 hours. No results for input(s): AMMONIA in the last 168 hours. CBC:  Recent Labs Lab 03/26/16 1117  WBC 7.9  HGB 9.4*  HCT 28.6*  MCV 83.4  PLT 219   Cardiac Enzymes: No results for input(s): CKTOTAL, CKMB, CKMBINDEX, TROPONINI in the last 168 hours. BNP: Invalid input(s): POCBNP CBG: No results for input(s): GLUCAP in the last 168 hours. D-Dimer No results for input(s): DDIMER in the last 72 hours. Hgb A1c No results for input(s): HGBA1C in the last 72 hours. Lipid Profile No results for input(s): CHOL, HDL, LDLCALC, TRIG, CHOLHDL, LDLDIRECT in the last 72 hours. Thyroid function studies No results for input(s): TSH, T4TOTAL, T3FREE, THYROIDAB in the last 72 hours.  Invalid input(s): FREET3 Anemia work up No results for input(s): VITAMINB12, FOLATE, FERRITIN, TIBC, IRON, RETICCTPCT in the last 72 hours. Urinalysis    Component Value Date/Time   COLORURINE YELLOW 08/11/2015 1235   APPEARANCEUR HAZY (A) 08/11/2015 1235   LABSPEC 1.015 08/11/2015 1235   PHURINE 7.5 08/11/2015 1235   GLUCOSEU 100 (A) 08/11/2015 1235   HGBUR SMALL (A) 08/11/2015 1235   BILIRUBINUR NEGATIVE 08/11/2015 1235   KETONESUR NEGATIVE 08/11/2015 1235   PROTEINUR >300 (A)  08/11/2015 1235   UROBILINOGEN 0.2 01/09/2015 1645   NITRITE NEGATIVE 08/11/2015 1235   LEUKOCYTESUR SMALL (A) 08/11/2015 1235   Sepsis Labs Invalid input(s): PROCALCITONIN,  WBC,  LACTICIDVEN Microbiology No results found for this or any previous visit (from the past 240 hour(s)).   Time coordinating discharge: Over 30 minutes  SIGNED:   Elmarie Shiley, MD  [facility] 03/26/2016, 5:14 PM Pager [phone]  If 7PM-7AM, please contact night-coverage [email or web address] Password [name]

## 2016-03-26 NOTE — Progress Notes (Signed)
Nurse went into patient's room to giver her discharge paperwork and patient had already left.

## 2016-03-26 NOTE — ED Triage Notes (Signed)
Pt comes in for dialysis. Pt did not get dialysis on Monday. Pt swollen in abdomen. Denies any pain. NAD noted

## 2016-03-26 NOTE — Consult Note (Addendum)
Sara [name] MRN: [id number] DOB/AGE: 1980/12/04 35 y.o. Primary Care Physician:Pcp Not In System Admit date: 03/26/2016 Chief Complaint:  Chief Complaint  Patient presents with  . Dialysis   HPI: Pt is 35 year old female with past medical hx of ESRD who was admitted with c/o dyspnea," I need dialysis "  HPI dates 2 days ago as pt came to ER asking for her dialysis. Pt says " I need my dialysis , I did not get dialysis on MOnday" No c/o fever/cough/chills NO c/o nausea/vomiting. NO c/o abdominal pain. No c/o hematuria . NO c/o syncope. I educated pt again  to please stay for full tx.   Past Medical History:  Diagnosis Date  . Acute myopericarditis    hx/notes 10/09/2014  . Bipolar disorder ([place])    [name] 10/09/2014  . CHF (congestive heart failure) ([place])    systolic/notes [id number]  . Chronic anemia    [name] 10/09/2014  . Current use of steroid medication 12/21/2015  . ESRD (end stage renal disease) on dialysis [facility])    "[name]; [facility]" (10/09/2014)  . History of blood transfusion    "this is probably my 3rd" (10/09/2014)  . Hypertension   . Low back pain   . Lupus    lupus w nephritis  . 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 Non-compliant patient   . Positive ANA (antinuclear antibody) 08/16/2012  . Positive Smith antibody 08/16/2012  . Pregnancy   . Psychosis   . Schizoaffective disorder, bipolar type ([place]) 11/20/2014  . Schizophrenia ([place])    [name] 10/09/2014  . Tobacco abuse 02/20/2014        Family History  Problem Relation Age of Onset  . Drug abuse Father   . Kidney disease Father     Social History:  reports that she has been smoking Cigarettes.  She has a 2.00 pack-year smoking history. She has never used smokeless tobacco. She reports that she does not drink alcohol or use drugs.   Allergies:  Allergies  Allergen Reactions  . Ativan [Lorazepam] Swelling and Other (See Comments)    Reaction:  Difficulty speaking/slurred speech   . Geodon [Ziprasidone Hcl]  Itching and Swelling    Reaction:  Tongue swelling   . Keflex [Cephalexin] Swelling and Other (See Comments)    Reaction:  Tongue swelling/pt is unable to talk   . Haldol [Haloperidol Lactate] Swelling and Other (See Comments)    Reaction:  Tongue swelling -- 05/31/15  Pt is able to take with Benadryl.    . Other Itching and Other (See Comments)    Pt states that she is allergic to wool.      (Not in a hospital admission)     [id number] from the symptoms mentioned above,there are no other symptoms referable to all systems reviewed.      Physical Exam: Vital signs in last 24 hours: Temp:  [97.5 F (36.4 C)] 97.5 F (36.4 C) (12/20 0728) Pulse Rate:  [114] 114 (12/20 0728) Resp:  [20] 20 (12/20 0728) BP: (157)/(113) 157/113 (12/20 0728) SpO2:  [100 %] 100 % (12/20 0728) Weight:  [175 lb 9.6 oz (79.7 kg)] 175 lb 9.6 oz (79.7 kg) (12/20 0728) Weight change:     Intake/Output from previous day: No intake/output data recorded. No intake/output data recorded.   Physical Exam: General- pt is awake,alert, follows comands Resp- No acute REsp distress, decreased at bases. CVS- S1S2 regular in rate and rhythm, NO rubs  GIT- BS+, soft, NT, ND EXT- 1+ LE  Edema, NO Cyanosis CNS- CN 2-12 grossly intact. Moving all 4 extremities Access- AVF+    Lab Results:  CBC    Component Value Date/Time   WBC 8.3 03/19/2016 1255   RBC 3.43 (L) 03/19/2016 1255   HGB 9.3 (L) 03/19/2016 1255   HCT 30.0 (L) 03/19/2016 1255   PLT 206 03/19/2016 1255   MCV 87.5 03/19/2016 1255   MCH 27.1 03/19/2016 1255   MCHC 31.0 03/19/2016 1255   RDW 20.6 (H) 03/19/2016 1255   LYMPHSABS 1.7 03/19/2016 1255   MONOABS 0.7 03/19/2016 1255   EOSABS 0.0 03/19/2016 1255   BASOSABS 0.0 03/19/2016 1255      BMET BMP Latest Ref Rng & Units 03/19/2016 03/17/2016 03/14/2016  Glucose 65 - 99 mg/dL 76 93 69  BUN 6 - 20 mg/dL 52(H) 47(H) 43(H)  Creatinine 0.44 - 1.00 mg/dL 9.74(H) 7.74(H) 8.23(H)  Sodium  135 - 145 mmol/L 135 135 133(L)  Potassium 3.5 - 5.1 mmol/L 5.8(H) 4.3 4.3  Chloride 101 - 111 mmol/L 97(L) 97(L) 94(L)  CO2 22 - 32 mmol/L 21(L) 25 24  Calcium 8.9 - 10.3 mg/dL 9.4 9.3 9.1         Lab Results  Component Value Date   PTH 1,021 (H) 02/29/2016   PTH Comment 02/29/2016   CALCIUM 9.4 03/19/2016   CAION 1.10 (L) 02/23/2016   PHOS 6.7 (H) 03/19/2016      Impression: 1)Renal  ESRD on HD                Pt is not on regular  Schedule sec to her complaince/adherence issues                Will dialyze pt today  2)HTN Bp is not at goal Hx of non adherence to medical tx  3)Anemia In ESRD the goal for HGb is 9--11. Pt HGb is  at goal Will keep on epo   4)CKD Mineral-Bone Disorder PTH high. Secondary Hyperparathyroidism  Present.   Not at goal sec to non adherence to hd tx   Phosphorus not at goal.  on binders  5)Psych .  Hx of   psycosis Schizophrenia Primary MD following  6)Electrolytes  Hx of Hyperkalemia      Sec to non adherence to HD   Hx of Hyponatremia   7)Acid base Co2  at goal    Plan:  Will dialyze today Will use 2 k bath Will keep on epo-4000 units  Will keep on hectrol-47mcg will dialyze for 4hrs ( if pt stays )  Will try to take 3 liters off if possible  I again educated pt about need to adhere to medical tx.   Addendum Pt seen on HD  Sara [name] 03/26/2016, 8:22 AM
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
## 2016-03-26 NOTE — ED Notes (Signed)
Hospitalist at bedside 
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
## 2016-03-28 ENCOUNTER — Observation Stay (HOSPITAL_COMMUNITY)
Admission: EM | Admit: 2016-03-28 | Discharge: 2016-03-28 | Discharge status: Against Medical Advice | Payer: Medicare Other | Attending: Family Medicine | Admitting: Family Medicine

## 2016-03-28 ENCOUNTER — Encounter (HOSPITAL_COMMUNITY): Payer: Self-pay

## 2016-03-28 DIAGNOSIS — Z992 Dependence on renal dialysis: Secondary | ICD-10-CM | POA: Diagnosis not present

## 2016-03-28 DIAGNOSIS — N186 End stage renal disease: Secondary | ICD-10-CM | POA: Diagnosis not present

## 2016-03-28 DIAGNOSIS — Z7982 Long term (current) use of aspirin: Secondary | ICD-10-CM | POA: Insufficient documentation

## 2016-03-28 DIAGNOSIS — I132 Hypertensive heart and chronic kidney disease with heart failure and with stage 5 chronic kidney disease, or end stage renal disease: Principal | ICD-10-CM | POA: Insufficient documentation

## 2016-03-28 DIAGNOSIS — I120 Hypertensive chronic kidney disease with stage 5 chronic kidney disease or end stage renal disease: Secondary | ICD-10-CM | POA: Diagnosis not present

## 2016-03-28 DIAGNOSIS — F250 Schizoaffective disorder, bipolar type: Secondary | ICD-10-CM | POA: Diagnosis not present

## 2016-03-28 DIAGNOSIS — M3214 Glomerular disease in systemic lupus erythematosus: Secondary | ICD-10-CM | POA: Insufficient documentation

## 2016-03-28 DIAGNOSIS — M899 Disorder of bone, unspecified: Secondary | ICD-10-CM | POA: Insufficient documentation

## 2016-03-28 DIAGNOSIS — D631 Anemia in chronic kidney disease: Secondary | ICD-10-CM | POA: Insufficient documentation

## 2016-03-28 DIAGNOSIS — I10 Essential (primary) hypertension: Secondary | ICD-10-CM | POA: Diagnosis not present

## 2016-03-28 DIAGNOSIS — Z79899 Other long term (current) drug therapy: Secondary | ICD-10-CM | POA: Insufficient documentation

## 2016-03-28 DIAGNOSIS — Z9119 Patient's noncompliance with other medical treatment and regimen: Secondary | ICD-10-CM | POA: Insufficient documentation

## 2016-03-28 DIAGNOSIS — I5022 Chronic systolic (congestive) heart failure: Secondary | ICD-10-CM | POA: Diagnosis not present

## 2016-03-28 DIAGNOSIS — Z7952 Long term (current) use of systemic steroids: Secondary | ICD-10-CM | POA: Insufficient documentation

## 2016-03-28 DIAGNOSIS — F17210 Nicotine dependence, cigarettes, uncomplicated: Secondary | ICD-10-CM | POA: Diagnosis not present

## 2016-03-28 LAB — RENAL FUNCTION PANEL
Albumin: 3.2 g/dL — ABNORMAL LOW (ref 3.5–5.0)
Anion gap: 14 (ref 5–15)
BUN: 73 mg/dL — ABNORMAL HIGH (ref 6–20)
CO2: 24 mmol/L (ref 22–32)
Calcium: 9.3 mg/dL (ref 8.9–10.3)
Chloride: 99 mmol/L — ABNORMAL LOW (ref 101–111)
Creatinine, Ser: 12.71 mg/dL — ABNORMAL HIGH (ref 0.44–1.00)
GFR calc Af Amer: 4 mL/min — ABNORMAL LOW (ref >60)
GFR calc non Af Amer: 3 mL/min — ABNORMAL LOW (ref >60)
Glucose, Bld: 90 mg/dL (ref 65–99)
Phosphorus: 8.8 mg/dL — ABNORMAL HIGH (ref 2.5–4.6)
Potassium: 5 mmol/L (ref 3.5–5.1)
Sodium: 137 mmol/L (ref 135–145)

## 2016-03-28 LAB — CBC
HCT: 28.9 % — ABNORMAL LOW (ref 36.0–46.0)
Hemoglobin: 9.2 g/dL — ABNORMAL LOW (ref 12.0–15.0)
MCH: 26.8 pg (ref 26.0–34.0)
MCHC: 31.8 g/dL (ref 30.0–36.0)
MCV: 84.3 fL (ref 78.0–100.0)
PLATELETS: 243 10*3/uL (ref 150–400)
RBC: 3.43 MIL/uL — ABNORMAL LOW (ref 3.87–5.11)
RDW: 18.5 % — AB (ref 11.5–15.5)
WBC: 8.6 10*3/uL (ref 4.0–10.5)

## 2016-03-28 MED ORDER — HEPARIN SODIUM (PORCINE) 1000 UNIT/ML IJ SOLN
INTRAMUSCULAR | Status: AC
  Administered 2016-03-28: 1600 [IU] via INTRAVENOUS_CENTRAL
  Filled 2016-03-28: qty 6

## 2016-03-28 MED ORDER — SODIUM CHLORIDE 0.9% FLUSH: 3.0000 mL | INTRAVENOUS | Status: DC | PRN

## 2016-03-28 MED ORDER — EPOETIN ALFA 4000 UNIT/ML IJ SOLN
INTRAMUSCULAR | Status: AC
  Filled 2016-03-28: qty 1

## 2016-03-28 MED ORDER — HEPARIN SODIUM (PORCINE) 1000 UNIT/ML DIALYSIS
20.0000 [IU]/kg | INTRAMUSCULAR | Status: DC | PRN
  Administered 2016-03-28: 1600 [IU] via INTRAVENOUS_CENTRAL
  Filled 2016-03-28 (×2): qty 2

## 2016-03-28 MED ORDER — EPOETIN ALFA 20000 UNIT/ML IJ SOLN
14000.0000 [IU] | INTRAMUSCULAR | Status: DC
  Administered 2016-03-28: 14000 [IU] via INTRAVENOUS
  Filled 2016-03-28 (×2): qty 1

## 2016-03-28 MED ORDER — EPOETIN ALFA 20000 UNIT/ML IJ SOLN
INTRAMUSCULAR | Status: AC
  Administered 2016-03-28: 14000 [IU] via INTRAVENOUS
  Filled 2016-03-28: qty 1

## 2016-03-28 MED ORDER — SODIUM CHLORIDE 0.9 % IV SOLN: 250.0000 mL | INTRAVENOUS | Status: DC | PRN

## 2016-03-28 MED ORDER — TRAMADOL HCL 50 MG PO TABS
50.0000 mg | ORAL_TABLET | Freq: Two times a day (BID) | ORAL | Status: AC | PRN
  Administered 2016-03-28: 50 mg via ORAL

## 2016-03-28 MED ORDER — SODIUM CHLORIDE 0.9% FLUSH: 3.0000 mL | Freq: Two times a day (BID) | INTRAVENOUS | Status: DC

## 2016-03-28 MED ORDER — PENTAFLUOROPROP-TETRAFLUOROETH EX AERO: 1.0000 "application " | INHALATION_SPRAY | CUTANEOUS | Status: DC | PRN

## 2016-03-28 MED ORDER — SODIUM CHLORIDE 0.9 % IV SOLN: 100.0000 mL | INTRAVENOUS | Status: DC | PRN

## 2016-03-28 MED ORDER — LIDOCAINE-PRILOCAINE 2.5-2.5 % EX CREA: 1.0000 "application " | TOPICAL_CREAM | CUTANEOUS | Status: DC | PRN

## 2016-03-28 MED ORDER — EPOETIN ALFA 10000 UNIT/ML IJ SOLN
INTRAMUSCULAR | Status: AC
  Filled 2016-03-28: qty 1

## 2016-03-28 MED ORDER — DIPHENHYDRAMINE HCL 25 MG PO CAPS
ORAL_CAPSULE | ORAL | Status: AC
  Administered 2016-03-28: 25 mg via ORAL
  Filled 2016-03-28: qty 1

## 2016-03-28 MED ORDER — DOXERCALCIFEROL 4 MCG/2ML IV SOLN
INTRAVENOUS | Status: AC
  Administered 2016-03-28: 5 ug via INTRAVENOUS
  Filled 2016-03-28: qty 4

## 2016-03-28 MED ORDER — DOXERCALCIFEROL 4 MCG/2ML IV SOLN
5.0000 ug | INTRAVENOUS | Status: DC
  Administered 2016-03-28: 5 ug via INTRAVENOUS
  Filled 2016-03-28 (×2): qty 4

## 2016-03-28 MED ORDER — SEVELAMER CARBONATE 800 MG PO TABS
ORAL_TABLET | ORAL | Status: AC
  Administered 2016-03-28: 3200 mg via ORAL
  Filled 2016-03-28: qty 4

## 2016-03-28 MED ORDER — LIDOCAINE HCL (PF) 1 % IJ SOLN: 5.0000 mL | INTRAMUSCULAR | Status: DC | PRN

## 2016-03-28 MED ORDER — DIPHENHYDRAMINE HCL 25 MG PO CAPS
25.0000 mg | ORAL_CAPSULE | Freq: Four times a day (QID) | ORAL | Status: DC | PRN
  Administered 2016-03-28: 25 mg via ORAL

## 2016-03-28 MED ORDER — TRAMADOL HCL 50 MG PO TABS
ORAL_TABLET | ORAL | Status: AC
  Administered 2016-03-28: 50 mg via ORAL
  Filled 2016-03-28: qty 1

## 2016-03-28 MED ORDER — EPOETIN ALFA 20000 UNIT/ML IJ SOLN
14000.0000 [IU] | INTRAMUSCULAR | Status: DC
  Filled 2016-03-28: qty 1

## 2016-03-28 MED ORDER — SODIUM CHLORIDE 0.9 % IV SOLN
125.0000 mg | INTRAVENOUS | Status: DC
  Administered 2016-03-28: 125 mg via INTRAVENOUS
  Filled 2016-03-28 (×2): qty 10

## 2016-03-28 MED ORDER — SEVELAMER CARBONATE 800 MG PO TABS
3200.0000 mg | ORAL_TABLET | Freq: Three times a day (TID) | ORAL | Status: DC
  Administered 2016-03-28: 3200 mg via ORAL
  Filled 2016-03-28: qty 4
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
## 2016-03-28 NOTE — Progress Notes (Signed)
Pt received dialysis procedure and has left AMA without informing nurse of departure. MD notified via E-Page.

## 2016-03-28 NOTE — Procedures (Signed)
    HEMODIALYSIS TREATMENT NOTE:  4.25 hour low-heparin dialysis completed via right upper arm AVF (15g ante/retrograde). Goal nearly met, however pt elected to end HD session 15 minutes early AMA.  All blood was returned and hemostasis was achieved within 15 minutes.  Net UF 3.9 liters.  Pt states she wishes to return to her room upstairs to wait for her ride home.  Report called to Vista Deck, RN.  Rockwell Alexandria, RN, CDN

## 2016-03-28 NOTE — ED Triage Notes (Signed)
Pt is here for dialysis. No complaints
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
## 2016-03-29 LAB — HEPATITIS B SURFACE ANTIGEN: Hepatitis B Surface Ag: NEGATIVE

## 2016-04-02 ENCOUNTER — Encounter (HOSPITAL_COMMUNITY): Payer: Self-pay | Admitting: Cardiology

## 2016-04-02 ENCOUNTER — Observation Stay (HOSPITAL_COMMUNITY)
Admission: EM | Admit: 2016-04-02 | Discharge: 2016-04-02 | Disposition: A | Discharge status: Home / Self Care | Payer: Medicare Other | Attending: Internal Medicine | Admitting: Internal Medicine

## 2016-04-02 DIAGNOSIS — E871 Hypo-osmolality and hyponatremia: Secondary | ICD-10-CM | POA: Diagnosis not present

## 2016-04-02 DIAGNOSIS — M899 Disorder of bone, unspecified: Secondary | ICD-10-CM | POA: Insufficient documentation

## 2016-04-02 DIAGNOSIS — Z7952 Long term (current) use of systemic steroids: Secondary | ICD-10-CM | POA: Diagnosis not present

## 2016-04-02 DIAGNOSIS — Z992 Dependence on renal dialysis: Secondary | ICD-10-CM | POA: Insufficient documentation

## 2016-04-02 DIAGNOSIS — E875 Hyperkalemia: Secondary | ICD-10-CM | POA: Insufficient documentation

## 2016-04-02 DIAGNOSIS — F17210 Nicotine dependence, cigarettes, uncomplicated: Secondary | ICD-10-CM | POA: Insufficient documentation

## 2016-04-02 DIAGNOSIS — F250 Schizoaffective disorder, bipolar type: Secondary | ICD-10-CM | POA: Diagnosis not present

## 2016-04-02 DIAGNOSIS — I5022 Chronic systolic (congestive) heart failure: Secondary | ICD-10-CM | POA: Diagnosis not present

## 2016-04-02 DIAGNOSIS — D631 Anemia in chronic kidney disease: Secondary | ICD-10-CM | POA: Insufficient documentation

## 2016-04-02 DIAGNOSIS — I120 Hypertensive chronic kidney disease with stage 5 chronic kidney disease or end stage renal disease: Secondary | ICD-10-CM | POA: Diagnosis not present

## 2016-04-02 DIAGNOSIS — Z79899 Other long term (current) drug therapy: Secondary | ICD-10-CM | POA: Diagnosis not present

## 2016-04-02 DIAGNOSIS — Z7982 Long term (current) use of aspirin: Secondary | ICD-10-CM | POA: Diagnosis not present

## 2016-04-02 DIAGNOSIS — N186 End stage renal disease: Secondary | ICD-10-CM | POA: Insufficient documentation

## 2016-04-02 DIAGNOSIS — I10 Essential (primary) hypertension: Secondary | ICD-10-CM | POA: Diagnosis not present

## 2016-04-02 DIAGNOSIS — Z9115 Patient's noncompliance with renal dialysis: Secondary | ICD-10-CM | POA: Diagnosis not present

## 2016-04-02 DIAGNOSIS — I132 Hypertensive heart and chronic kidney disease with heart failure and with stage 5 chronic kidney disease, or end stage renal disease: Secondary | ICD-10-CM | POA: Diagnosis not present

## 2016-04-02 LAB — RENAL FUNCTION PANEL
Albumin: 3.3 g/dL — ABNORMAL LOW (ref 3.5–5.0)
Anion gap: 15 (ref 5–15)
BUN: 93 mg/dL — ABNORMAL HIGH (ref 6–20)
CO2: 20 mmol/L — ABNORMAL LOW (ref 22–32)
Calcium: 9.4 mg/dL (ref 8.9–10.3)
Chloride: 97 mmol/L — ABNORMAL LOW (ref 101–111)
Creatinine, Ser: 13.42 mg/dL — ABNORMAL HIGH (ref 0.44–1.00)
GFR calc Af Amer: 4 mL/min — ABNORMAL LOW (ref >60)
GFR calc non Af Amer: 3 mL/min — ABNORMAL LOW (ref >60)
Glucose, Bld: 140 mg/dL — ABNORMAL HIGH (ref 65–99)
Phosphorus: 6.6 mg/dL — ABNORMAL HIGH (ref 2.5–4.6)
Potassium: 5.8 mmol/L — ABNORMAL HIGH (ref 3.5–5.1)
Sodium: 132 mmol/L — ABNORMAL LOW (ref 135–145)

## 2016-04-02 LAB — CBC WITH DIFFERENTIAL/PLATELET
Basophils Absolute: 0 10*3/uL (ref 0.0–0.1)
Basophils Relative: 0 %
Eosinophils Absolute: 0 10*3/uL (ref 0.0–0.7)
Eosinophils Relative: 0 %
HCT: 30.4 % — ABNORMAL LOW (ref 36.0–46.0)
Hemoglobin: 9.7 g/dL — ABNORMAL LOW (ref 12.0–15.0)
Lymphocytes Relative: 11 %
Lymphs Abs: 1 10*3/uL (ref 0.7–4.0)
MCH: 27.6 pg (ref 26.0–34.0)
MCHC: 31.9 g/dL (ref 30.0–36.0)
MCV: 86.6 fL (ref 78.0–100.0)
Monocytes Absolute: 0.5 10*3/uL (ref 0.1–1.0)
Monocytes Relative: 5 %
Neutro Abs: 7.8 10*3/uL — ABNORMAL HIGH (ref 1.7–7.7)
Neutrophils Relative %: 84 %
Platelets: 285 10*3/uL (ref 150–400)
RBC: 3.51 MIL/uL — ABNORMAL LOW (ref 3.87–5.11)
RDW: 19.1 % — ABNORMAL HIGH (ref 11.5–15.5)
WBC: 9.3 10*3/uL (ref 4.0–10.5)

## 2016-04-02 MED ORDER — HEPARIN SODIUM (PORCINE) 1000 UNIT/ML DIALYSIS
20.0000 [IU]/kg | INTRAMUSCULAR | Status: DC | PRN
  Filled 2016-04-02: qty 2

## 2016-04-02 MED ORDER — PENTAFLUOROPROP-TETRAFLUOROETH EX AERO: 1.0000 "application " | INHALATION_SPRAY | CUTANEOUS | Status: DC | PRN

## 2016-04-02 MED ORDER — HEPARIN SODIUM (PORCINE) 1000 UNIT/ML DIALYSIS
1000.0000 [IU] | INTRAMUSCULAR | Status: DC | PRN
  Filled 2016-04-02: qty 1

## 2016-04-02 MED ORDER — DOXERCALCIFEROL 4 MCG/2ML IV SOLN
4.0000 ug | INTRAVENOUS | Status: DC
  Administered 2016-04-02: 4 ug via INTRAVENOUS
  Filled 2016-04-02 (×2): qty 2

## 2016-04-02 MED ORDER — SEVELAMER CARBONATE 800 MG PO TABS
3200.0000 mg | ORAL_TABLET | Freq: Three times a day (TID) | ORAL | Status: DC
  Administered 2016-04-02: 3200 mg via ORAL
  Filled 2016-04-02 (×7): qty 4

## 2016-04-02 MED ORDER — LIDOCAINE HCL (PF) 1 % IJ SOLN: 5.0000 mL | INTRAMUSCULAR | Status: DC | PRN

## 2016-04-02 MED ORDER — LIDOCAINE-PRILOCAINE 2.5-2.5 % EX CREA: 1.0000 "application " | TOPICAL_CREAM | CUTANEOUS | Status: DC | PRN

## 2016-04-02 MED ORDER — SODIUM CHLORIDE 0.9 % IV SOLN: 100.0000 mL | INTRAVENOUS | Status: DC | PRN

## 2016-04-02 MED ORDER — ALTEPLASE 2 MG IJ SOLR
2.0000 mg | Freq: Once | INTRAMUSCULAR | Status: DC | PRN
  Filled 2016-04-02: qty 2

## 2016-04-02 MED ORDER — EPOETIN ALFA 4000 UNIT/ML IJ SOLN
4000.0000 [IU] | INTRAMUSCULAR | Status: DC
  Administered 2016-04-02: 4000 [IU] via SUBCUTANEOUS
  Filled 2016-04-02 (×2): qty 1

## 2016-04-02 NOTE — ED Triage Notes (Signed)
Here for dialysis
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
## 2016-04-02 NOTE — Discharge Summary (Signed)
    Physician Discharge Summary  Sara [name] [name] DOB: July 26, 1980 DOA: 04/02/2016  PCP: Pcp Not In System  Admit date: 04/02/2016 Discharge date: 04/02/2016  Time spent: 45 minutes  Recommendations for Outpatient Follow-up:  -Will be discharged today after HD complete.   Discharge Diagnoses:  Active Problems:   ESRD (end stage renal disease) on dialysis [facility])   Discharge Condition: Stable and improved  Filed Weights   04/02/16 0905  Weight: 88 kg (194 lb 1.6 oz)    History of present illness:  Sara [name] is a 35 y.o. female well known to [place] for admissions 3 x/week for HD purposes. She missed HD on Monday due to Christmas. Has no symptoms; is here for scheduled HD.  Hospital Course:   ESRD on HD -Received HD, no complications noted.  Procedures:  HD   Consultations:  Nephrology  Discharge Instructions  Discharge Instructions    Diet - low sodium heart healthy    Complete by:  As directed    Increase activity slowly    Complete by:  As directed       Allergies  Allergen Reactions  . Ativan [Lorazepam] Swelling and Other (See Comments)    Reaction:  Difficulty speaking/slurred speech   . Geodon [Ziprasidone Hcl] Itching and Swelling    Reaction:  Tongue swelling   . Keflex [Cephalexin] Swelling and Other (See Comments)    Reaction:  Tongue swelling/pt is unable to talk   . Haldol [Haloperidol Lactate] Swelling and Other (See Comments)    Reaction:  Tongue swelling -- 05/31/15  Pt is able to take with Benadryl.    . Other Itching and Other (See Comments)    Pt states that she is allergic to wool.       The results of significant diagnostics from this hospitalization (including imaging, microbiology, ancillary and laboratory) are listed below for reference.    Significant Diagnostic Studies: No results found.  Microbiology: No results found for this or any previous visit (from the past 240 hour(s)).   Labs: Basic Metabolic  Panel:  Recent Labs Lab 03/28/16 1156  NA 137  K 5.0  CL 99*  CO2 24  GLUCOSE 90  BUN 73*  CREATININE 12.71*  CALCIUM 9.3  PHOS 8.8*   Liver Function Tests:  Recent Labs Lab 03/28/16 1156  ALBUMIN 3.2*   No results for input(s): LIPASE, AMYLASE in the last 168 hours. No results for input(s): AMMONIA in the last 168 hours. CBC:  Recent Labs Lab 03/28/16 1156 04/02/16 1142  WBC 8.6 9.3  NEUTROABS  --  7.8*  HGB 9.2* 9.7*  HCT 28.9* 30.4*  MCV 84.3 86.6  PLT 243 285   Cardiac Enzymes: No results for input(s): CKTOTAL, CKMB, CKMBINDEX, TROPONINI in the last 168 hours. BNP: BNP (last 3 results) No results for input(s): BNP in the last 8760 hours.  ProBNP (last 3 results) No results for input(s): PROBNP in the last 8760 hours.  CBG: No results for input(s): GLUCAP in the last 168 hours.     SignedLelon Frohlich  [facility] Pager: [phone] 04/02/2016, 12:04 PM

## 2016-04-02 NOTE — Consult Note (Signed)
Sara [name] MRN: [id number] DOB/AGE: 07-28-1980 50 y.o. Primary Care Physician:Pcp Not In System Admit date: 04/02/2016 Chief Complaint:  Chief Complaint  Patient presents with  . Vascular Access Problem   HPI: Pt is 35 year old female with past medical hx of ESRD who was admitted with c/o dyspnea," I need dialysis "  HPI dates 2 days ago as pt came to ER asking for her dialysis. Pt says " I need my dialysis , I got dialysis on Friday" No c/o fever/cough/chills NO c/o nausea/vomiting. NO c/o abdominal pain. No c/o hematuria . NO c/o syncope. I educated pt again  to please stay for full tx.   Past Medical History:  Diagnosis Date  . Acute myopericarditis    hx/notes 10/09/2014  . Bipolar disorder ([place])    [name] 10/09/2014  . CHF (congestive heart failure) ([place])    systolic/notes [id number]  . Chronic anemia    [name] 10/09/2014  . Current use of steroid medication 12/21/2015  . ESRD (end stage renal disease) on dialysis [facility])    "[name]; [facility]" (10/09/2014)  . History of blood transfusion    "this is probably my 3rd" (10/09/2014)  . Hypertension   . Low back pain   . Lupus    lupus w nephritis  . 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 Non-compliant patient   . Positive ANA (antinuclear antibody) 08/16/2012  . Positive Smith antibody 08/16/2012  . Pregnancy   . Psychosis   . Schizoaffective disorder, bipolar type ([place]) 11/20/2014  . Schizophrenia ([place])    [name] 10/09/2014  . Tobacco abuse 02/20/2014        Family History  Problem Relation Age of Onset  . Drug abuse Father   . Kidney disease Father     Social History:  reports that she has been smoking Cigarettes.  She has a 2.00 pack-year smoking history. She has never used smokeless tobacco. She reports that she does not drink alcohol or use drugs.   Allergies:  Allergies  Allergen Reactions  . Ativan [Lorazepam] Swelling and Other (See Comments)    Reaction:  Difficulty speaking/slurred speech   . Geodon [Ziprasidone  Hcl] Itching and Swelling    Reaction:  Tongue swelling   . Keflex [Cephalexin] Swelling and Other (See Comments)    Reaction:  Tongue swelling/pt is unable to talk   . Haldol [Haloperidol Lactate] Swelling and Other (See Comments)    Reaction:  Tongue swelling -- 05/31/15  Pt is able to take with Benadryl.    . Other Itching and Other (See Comments)    Pt states that she is allergic to wool.      (Not in a hospital admission)     [id number] from the symptoms mentioned above,there are no other symptoms referable to all systems reviewed.      Physical Exam: Vital signs in last 24 hours: Temp:  [97.9 F (36.6 C)] 97.9 F (36.6 C) (12/27 0905) Pulse Rate:  [115] 115 (12/27 0905) Resp:  [18] 18 (12/27 0905) BP: (163)/(120) 163/120 (12/27 0905) SpO2:  [99 %] 99 % (12/27 0905) Weight:  [194 lb 1.6 oz (88 kg)] 194 lb 1.6 oz (88 kg) (12/27 0905) Weight change:     Intake/Output from previous day: No intake/output data recorded. No intake/output data recorded.   Physical Exam: General- pt is awake,alert, follows comands Resp- No acute REsp distress, decreased at bases. CVS- S1S2 regular in rate and rhythm, NO rubs  GIT- BS+, soft, NT, ND EXT-No LE Edema,  NO Cyanosis CNS- CN 2-12 grossly intact. Moving all 4 extremities Access- AVF+    Lab Results:  CBC    Component Value Date/Time   WBC 8.6 03/28/2016 1156   RBC 3.43 (L) 03/28/2016 1156   HGB 9.2 (L) 03/28/2016 1156   HCT 28.9 (L) 03/28/2016 1156   PLT 243 03/28/2016 1156   MCV 84.3 03/28/2016 1156   MCH 26.8 03/28/2016 1156   MCHC 31.8 03/28/2016 1156   RDW 18.5 (H) 03/28/2016 1156   LYMPHSABS 1.7 03/19/2016 1255   MONOABS 0.7 03/19/2016 1255   EOSABS 0.0 03/19/2016 1255   BASOSABS 0.0 03/19/2016 1255      BMET BMP Latest Ref Rng & Units 03/28/2016 03/26/2016 03/19/2016  Glucose 65 - 99 mg/dL 90 105(H) 76  BUN 6 - 20 mg/dL 73(H) 89(H) 52(H)  Creatinine 0.44 - 1.00 mg/dL 12.71(H) 14.72(H) 9.74(H)   Sodium 135 - 145 mmol/L 137 135 135  Potassium 3.5 - 5.1 mmol/L 5.0 5.9(H) 5.8(H)  Chloride 101 - 111 mmol/L 99(L) 98(L) 97(L)  CO2 22 - 32 mmol/L 24 21(L) 21(L)  Calcium 8.9 - 10.3 mg/dL 9.3 9.7 9.4         Lab Results  Component Value Date   PTH 1,021 (H) 02/29/2016   PTH Comment 02/29/2016   CALCIUM 9.3 03/28/2016   CAION 1.10 (L) 02/23/2016   PHOS 8.8 (H) 03/28/2016      Impression: 1)Renal  ESRD on HD                Pt is not on regular  Schedule sec to her complaince/adherence issues                Will dialyze pt today  2)HTN Bp is not at goal Hx of non adherence to medical tx  3)Anemia In ESRD the goal for HGb is 9--11. Pt HGb is  at goal Will keep on epo   4)CKD Mineral-Bone Disorder PTH high. Secondary Hyperparathyroidism  Present.   Not at goal sec to non adherence to hd tx   Phosphorus not at goal.  on binders  5)Psych .  Hx of   psycosis Schizophrenia Primary MD following  6)Electrolytes  Hx of Hyperkalemia      Sec to non adherence to HD   Hx of Hyponatremia   7)Acid base Co2  at goal    Plan:  Will dialyze today Will use 2 k bath Will keep on epo-4000 units  Will keep on hectrol-61mcg will dialyze for 4hrs ( if pt stays )  Will try to take 2.5 liters off if possible  I again educated pt about need to adhere to medical tx.   Addendum Pt seen on HD  Sara [name] 04/02/2016, 11:17 AM
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
## 2016-04-02 NOTE — ED Notes (Signed)
Pt leaving through ED, water given per reqeust.

## 2016-04-04 ENCOUNTER — Observation Stay (HOSPITAL_COMMUNITY)
Admission: EM | Admit: 2016-04-04 | Discharge: 2016-04-04 | Disposition: A | Discharge status: Home / Self Care | Payer: Medicare Other | Attending: Internal Medicine | Admitting: Internal Medicine

## 2016-04-04 ENCOUNTER — Encounter (HOSPITAL_COMMUNITY): Payer: Self-pay | Admitting: Emergency Medicine

## 2016-04-04 DIAGNOSIS — I10 Essential (primary) hypertension: Secondary | ICD-10-CM | POA: Diagnosis not present

## 2016-04-04 DIAGNOSIS — F250 Schizoaffective disorder, bipolar type: Secondary | ICD-10-CM | POA: Diagnosis not present

## 2016-04-04 DIAGNOSIS — Z9119 Patient's noncompliance with other medical treatment and regimen: Secondary | ICD-10-CM | POA: Insufficient documentation

## 2016-04-04 DIAGNOSIS — F17210 Nicotine dependence, cigarettes, uncomplicated: Secondary | ICD-10-CM | POA: Insufficient documentation

## 2016-04-04 DIAGNOSIS — I5022 Chronic systolic (congestive) heart failure: Secondary | ICD-10-CM | POA: Diagnosis not present

## 2016-04-04 DIAGNOSIS — Z79899 Other long term (current) drug therapy: Secondary | ICD-10-CM | POA: Diagnosis not present

## 2016-04-04 DIAGNOSIS — Z992 Dependence on renal dialysis: Secondary | ICD-10-CM | POA: Diagnosis not present

## 2016-04-04 DIAGNOSIS — M3214 Glomerular disease in systemic lupus erythematosus: Secondary | ICD-10-CM | POA: Insufficient documentation

## 2016-04-04 DIAGNOSIS — N186 End stage renal disease: Secondary | ICD-10-CM

## 2016-04-04 DIAGNOSIS — I132 Hypertensive heart and chronic kidney disease with heart failure and with stage 5 chronic kidney disease, or end stage renal disease: Secondary | ICD-10-CM | POA: Diagnosis not present

## 2016-04-04 DIAGNOSIS — D631 Anemia in chronic kidney disease: Secondary | ICD-10-CM | POA: Diagnosis not present

## 2016-04-04 DIAGNOSIS — Z7952 Long term (current) use of systemic steroids: Secondary | ICD-10-CM | POA: Insufficient documentation

## 2016-04-04 DIAGNOSIS — Z7982 Long term (current) use of aspirin: Secondary | ICD-10-CM | POA: Insufficient documentation

## 2016-04-04 DIAGNOSIS — I120 Hypertensive chronic kidney disease with stage 5 chronic kidney disease or end stage renal disease: Secondary | ICD-10-CM | POA: Diagnosis not present

## 2016-04-04 DIAGNOSIS — E875 Hyperkalemia: Secondary | ICD-10-CM | POA: Diagnosis not present

## 2016-04-04 MED ORDER — LIDOCAINE HCL (PF) 1 % IJ SOLN: 5.0000 mL | INTRAMUSCULAR | Status: DC | PRN

## 2016-04-04 MED ORDER — EPOETIN ALFA 4000 UNIT/ML IJ SOLN
INTRAMUSCULAR | Status: AC
  Administered 2016-04-04: 4000 [IU] via INTRAVENOUS
  Filled 2016-04-04: qty 1

## 2016-04-04 MED ORDER — HEPARIN SODIUM (PORCINE) 1000 UNIT/ML DIALYSIS
1000.0000 [IU] | INTRAMUSCULAR | Status: DC | PRN
  Filled 2016-04-04: qty 1

## 2016-04-04 MED ORDER — SODIUM CHLORIDE 0.9 % IV SOLN: 100.0000 mL | INTRAVENOUS | Status: DC | PRN

## 2016-04-04 MED ORDER — DIPHENHYDRAMINE HCL 25 MG PO CAPS
25.0000 mg | ORAL_CAPSULE | Freq: Four times a day (QID) | ORAL | Status: DC | PRN
  Administered 2016-04-04: 25 mg via ORAL

## 2016-04-04 MED ORDER — HEPARIN SODIUM (PORCINE) 1000 UNIT/ML IJ SOLN
INTRAMUSCULAR | Status: AC
  Administered 2016-04-04: 1800 [IU] via INTRAVENOUS_CENTRAL
  Filled 2016-04-04: qty 4

## 2016-04-04 MED ORDER — LIDOCAINE-PRILOCAINE 2.5-2.5 % EX CREA: 1.0000 "application " | TOPICAL_CREAM | CUTANEOUS | Status: DC | PRN

## 2016-04-04 MED ORDER — EPOETIN ALFA 4000 UNIT/ML IJ SOLN
4000.0000 [IU] | INTRAMUSCULAR | Status: DC
  Filled 2016-04-04: qty 1

## 2016-04-04 MED ORDER — ALTEPLASE 2 MG IJ SOLR
2.0000 mg | Freq: Once | INTRAMUSCULAR | Status: DC | PRN
  Filled 2016-04-04: qty 2

## 2016-04-04 MED ORDER — HEPARIN SODIUM (PORCINE) 1000 UNIT/ML DIALYSIS
20.0000 [IU]/kg | INTRAMUSCULAR | Status: DC | PRN
  Administered 2016-04-04: 1800 [IU] via INTRAVENOUS_CENTRAL
  Filled 2016-04-04 (×2): qty 2

## 2016-04-04 MED ORDER — DOXERCALCIFEROL 4 MCG/2ML IV SOLN
INTRAVENOUS | Status: AC
  Administered 2016-04-04: 4 ug via INTRAVENOUS
  Filled 2016-04-04: qty 2

## 2016-04-04 MED ORDER — DOXERCALCIFEROL 4 MCG/2ML IV SOLN
4.0000 ug | INTRAVENOUS | Status: DC
  Administered 2016-04-04: 4 ug via INTRAVENOUS
  Filled 2016-04-04 (×2): qty 2

## 2016-04-04 MED ORDER — DIPHENHYDRAMINE HCL 25 MG PO CAPS
ORAL_CAPSULE | ORAL | Status: AC
  Administered 2016-04-04: 25 mg via ORAL
  Filled 2016-04-04: qty 1

## 2016-04-04 MED ORDER — EPOETIN ALFA 4000 UNIT/ML IJ SOLN
4000.0000 [IU] | INTRAMUSCULAR | Status: DC
  Administered 2016-04-04: 4000 [IU] via INTRAVENOUS
  Filled 2016-04-04 (×2): qty 1

## 2016-04-04 MED ORDER — TRAMADOL HCL 50 MG PO TABS
ORAL_TABLET | ORAL | Status: AC
  Administered 2016-04-04: 50 mg via ORAL
  Filled 2016-04-04: qty 1

## 2016-04-04 MED ORDER — TRAMADOL HCL 50 MG PO TABS
50.0000 mg | ORAL_TABLET | Freq: Two times a day (BID) | ORAL | Status: DC | PRN
  Administered 2016-04-04: 50 mg via ORAL

## 2016-04-04 MED ORDER — PENTAFLUOROPROP-TETRAFLUOROETH EX AERO: 1.0000 "application " | INHALATION_SPRAY | CUTANEOUS | Status: DC | PRN

## 2016-04-04 NOTE — ED Triage Notes (Signed)
Pt here for dialysis, last dialyzed on Wednesday
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
## 2016-04-04 NOTE — ED Notes (Signed)
Meal tray given 

## 2016-04-04 NOTE — Consult Note (Addendum)
HETTY VOELLER MRN: [id number] DOB/AGE: 34-29-1982 34 y.o. Primary Care Physician:Pcp Not In System Admit date: 04/04/2016 Chief Complaint:  Chief Complaint  Patient presents with  . dialysis   HPI: Pt is 35 year old female with past medical hx of ESRD who was admitted with c/o dyspnea," I need dialysis "  HPI dates 1-2 days ago as pt came to ER asking for her dialysis. Pt says " I need my dialysis , I got dialysis on Wednesday" No c/o fever/cough/chills NO c/o nausea/vomiting. NO c/o abdominal pain. No c/o hematuria . NO c/o syncope. I educated pt again  to please stay for full tx.   Past Medical History:  Diagnosis Date  . Acute myopericarditis    hx/notes 10/09/2014  . Bipolar disorder ([place])    [name] 10/09/2014  . CHF (congestive heart failure) ([place])    systolic/notes [id number]  . Chronic anemia    [name] 10/09/2014  . Current use of steroid medication 12/21/2015  . ESRD (end stage renal disease) on dialysis [facility])    "[name]; [facility]" (10/09/2014)  . History of blood transfusion    "this is probably my 3rd" (10/09/2014)  . Hypertension   . Low back pain   . Lupus    lupus w nephritis  . 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 Non-compliant patient   . Positive ANA (antinuclear antibody) 08/16/2012  . Positive Smith antibody 08/16/2012  . Pregnancy   . Psychosis   . Schizoaffective disorder, bipolar type ([place]) 11/20/2014  . Schizophrenia ([place])    [name] 10/09/2014  . Tobacco abuse 02/20/2014        Family History  Problem Relation Age of Onset  . Drug abuse Father   . Kidney disease Father     Social History:  reports that she has been smoking Cigarettes.  She has a 2.00 pack-year smoking history. She has never used smokeless tobacco. She reports that she does not drink alcohol or use drugs.   Allergies:  Allergies  Allergen Reactions  . Ativan [Lorazepam] Swelling and Other (See Comments)    Reaction:  Difficulty speaking/slurred speech   . Geodon [Ziprasidone Hcl]  Itching and Swelling    Reaction:  Tongue swelling   . Keflex [Cephalexin] Swelling and Other (See Comments)    Reaction:  Tongue swelling/pt is unable to talk   . Haldol [Haloperidol Lactate] Swelling and Other (See Comments)    Reaction:  Tongue swelling -- 05/31/15  Pt is able to take with Benadryl.    . Other Itching and Other (See Comments)    Pt states that she is allergic to wool.      (Not in a hospital admission)     [id number] from the symptoms mentioned above,there are no other symptoms referable to all systems reviewed.      Physical Exam: Vital signs in last 24 hours: Temp:  [98.7 F (37.1 C)] 98.7 F (37.1 C) (12/29 0730) Pulse Rate:  [111] 111 (12/29 0730) Resp:  [22] 22 (12/29 0730) BP: (166)/(126) 166/126 (12/29 0730) SpO2:  [100 %] 100 % (12/29 0730) Weight:  [194 lb (88 kg)] 194 lb (88 kg) (12/29 0731) Weight change:     Intake/Output from previous day: No intake/output data recorded. No intake/output data recorded.   Physical Exam: General- pt is awake,alert, follows comands Resp- No acute REsp distress, chest clear to auscultataion. CVS- S1S2 regular in rate and rhythm, NO rubs  GIT- BS+, soft, NT, ND EXT-No LE Edema, NO Cyanosis CNS- CN 2-12  grossly intact. Moving all 4 extremities Access- AVF+    Lab Results:  CBC    Component Value Date/Time   WBC 9.3 04/02/2016 1142   RBC 3.51 (L) 04/02/2016 1142   HGB 9.7 (L) 04/02/2016 1142   HCT 30.4 (L) 04/02/2016 1142   PLT 285 04/02/2016 1142   MCV 86.6 04/02/2016 1142   MCH 27.6 04/02/2016 1142   MCHC 31.9 04/02/2016 1142   RDW 19.1 (H) 04/02/2016 1142   LYMPHSABS 1.0 04/02/2016 1142   MONOABS 0.5 04/02/2016 1142   EOSABS 0.0 04/02/2016 1142   BASOSABS 0.0 04/02/2016 1142      BMET BMP Latest Ref Rng & Units 04/02/2016 03/28/2016 03/26/2016  Glucose 65 - 99 mg/dL 140(H) 90 105(H)  BUN 6 - 20 mg/dL 93(H) 73(H) 89(H)  Creatinine 0.44 - 1.00 mg/dL 13.42(H) 12.71(H) 14.72(H)   Sodium 135 - 145 mmol/L 132(L) 137 135  Potassium 3.5 - 5.1 mmol/L 5.8(H) 5.0 5.9(H)  Chloride 101 - 111 mmol/L 97(L) 99(L) 98(L)  CO2 22 - 32 mmol/L 20(L) 24 21(L)  Calcium 8.9 - 10.3 mg/dL 9.4 9.3 9.7         Lab Results  Component Value Date   PTH 1,021 (H) 02/29/2016   PTH Comment 02/29/2016   CALCIUM 9.4 04/02/2016   CAION 1.10 (L) 02/23/2016   PHOS 6.6 (H) 04/02/2016      Impression: 1)Renal  ESRD on HD                Pt is not on regular  Schedule sec to her complaince/adherence issues                Will dialyze pt today  2)HTN Bp is not at goal Hx of non adherence to medical tx  3)Anemia In ESRD the goal for HGb is 9--11. Pt HGb is  at goal Will keep on epo   4)CKD Mineral-Bone Disorder PTH high. Secondary Hyperparathyroidism  Present.   Not at goal sec to non adherence to hd tx   Phosphorus not at goal.  on binders  5)Psych .  Hx of   psycosis Schizophrenia Primary MD following  6)Electrolytes  Hx of Hyperkalemia      Sec to non adherence to HD   Hx of Hyponatremia   7)Acid base Co2  at goal    Plan:  Will dialyze today Will use 2 k bath Will keep on epo-4000 units  Will keep on hectrol-86mcg will dialyze for 4hrs ( if pt stays )  Will try to take 2.5 liters off if possible  I again educated pt about need to adhere to medical tx.  Addendum Pt seen on HD Pt tolerating tx well.  Glendia Olshefski S 04/04/2016, 9:30 AM

## 2016-04-04 NOTE — Discharge Summary (Signed)
Physician Discharge Summary  Sara [name] [id number] DOB: 1980/09/14 DOA: 04/04/2016  PCP: Pcp Not In System  Admit date: 04/04/2016 Discharge date: 04/04/2016  Time spent: 45 minutes  Recommendations for Outpatient Follow-up:  -Will be discharged home today after HD.   Discharge Diagnoses:  Active Problems:   ESRD (end stage renal disease) on dialysis ([place])   ESRD (end stage renal disease) ([place])   Discharge Condition: Stable and improved  Filed Weights   04/04/16 0731 04/04/16 1250  Weight: 88 kg (194 lb) 87.9 kg (193 lb 12.6 oz)    History of present illness:  Sara [name] is a 35 y.o. female here for HD needs. No complaints.  Hospital Course:   ESRD on HD -Completed HD session without complications.  Procedures:  HD  Consultations:  Nephrology  Discharge Instructions  Discharge Instructions    Diet - low sodium heart healthy    Complete by:  As directed    Increase activity slowly    Complete by:  As directed      Allergies as of 04/04/2016      Reactions   Ativan [lorazepam] Swelling, Other (See Comments)   Reaction:  Difficulty speaking/slurred speech    Geodon [ziprasidone Hcl] Itching, Swelling   Reaction:  Tongue swelling    Keflex [cephalexin] Swelling, Other (See Comments)   Reaction:  Tongue swelling/pt is unable to talk    Haldol [haloperidol Lactate] Swelling, Other (See Comments)   Reaction:  Tongue swelling -- 05/31/15  Pt is able to take with Benadryl.     Other Itching, Other (See Comments)   Pt states that she is allergic to wool.       Medication List    TAKE these medications   acetaminophen 500 MG tablet Commonly known as:  TYLENOL Take 1,000 mg by mouth every 6 (six) hours as needed for mild pain, moderate pain or headache.   aspirin-acetaminophen-caffeine 250-250-65 MG tablet Commonly known as:  EXCEDRIN MIGRAINE Take 2 tablets by mouth every 6 (six) hours as needed for headache.   metoprolol 50 MG  tablet Commonly known as:  LOPRESSOR Take 1 tablet (50 mg total) by mouth 2 (two) times daily.   predniSONE 20 MG tablet Commonly known as:  DELTASONE Take 1 tablet (20 mg total) by mouth daily with breakfast.   risperiDONE microspheres 25 MG injection Commonly known as:  RISPERDAL CONSTA Inject 25 mg into the muscle every 14 (fourteen) days.   sevelamer carbonate 800 MG tablet Commonly known as:  RENVELA Take 4 tablets (3,200 mg total) by mouth 3 (three) times daily with meals.   traMADol 50 MG tablet Commonly known as:  ULTRAM Take 1 tablet (50 mg total) by mouth every 6 (six) hours as needed for moderate pain.      Allergies  Allergen Reactions  . Ativan [Lorazepam] Swelling and Other (See Comments)    Reaction:  Difficulty speaking/slurred speech   . Geodon [Ziprasidone Hcl] Itching and Swelling    Reaction:  Tongue swelling   . Keflex [Cephalexin] Swelling and Other (See Comments)    Reaction:  Tongue swelling/pt is unable to talk   . Haldol [Haloperidol Lactate] Swelling and Other (See Comments)    Reaction:  Tongue swelling -- 05/31/15  Pt is able to take with Benadryl.    . Other Itching and Other (See Comments)    Pt states that she is allergic to wool.       The results of significant diagnostics  from this hospitalization (including imaging, microbiology, ancillary and laboratory) are listed below for reference.    Significant Diagnostic Studies: No results found.  Microbiology: No results found for this or any previous visit (from the past 240 hour(s)).   Labs: Basic Metabolic Panel:  Recent Labs Lab 04/02/16 1142  NA 132*  K 5.8*  CL 97*  CO2 20*  GLUCOSE 140*  BUN 93*  CREATININE 13.42*  CALCIUM 9.4  PHOS 6.6*   Liver Function Tests:  Recent Labs Lab 04/02/16 1142  ALBUMIN 3.3*   No results for input(s): LIPASE, AMYLASE in the last 168 hours. No results for input(s): AMMONIA in the last 168 hours. CBC:  Recent Labs Lab  04/02/16 1142  WBC 9.3  NEUTROABS 7.8*  HGB 9.7*  HCT 30.4*  MCV 86.6  PLT 285   Cardiac Enzymes: No results for input(s): CKTOTAL, CKMB, CKMBINDEX, TROPONINI in the last 168 hours. BNP: BNP (last 3 results) No results for input(s): BNP in the last 8760 hours.  ProBNP (last 3 results) No results for input(s): PROBNP in the last 8760 hours.  CBG: No results for input(s): GLUCAP in the last 168 hours.     SignedLelon Frohlich  [facility] Pager: [phone] 04/04/2016, 3:16 PM

## 2016-04-09 ENCOUNTER — Observation Stay (HOSPITAL_COMMUNITY)
Admission: EM | Admit: 2016-04-09 | Discharge: 2016-04-10 | Discharge status: Against Medical Advice | Payer: Medicare Other | Source: Home / Self Care | Attending: Emergency Medicine | Admitting: Emergency Medicine

## 2016-04-09 ENCOUNTER — Encounter (HOSPITAL_COMMUNITY): Payer: Self-pay | Admitting: Emergency Medicine

## 2016-04-09 DIAGNOSIS — Z9115 Patient's noncompliance with renal dialysis: Secondary | ICD-10-CM | POA: Diagnosis not present

## 2016-04-09 DIAGNOSIS — Z7952 Long term (current) use of systemic steroids: Secondary | ICD-10-CM

## 2016-04-09 DIAGNOSIS — I1311 Hypertensive heart and chronic kidney disease without heart failure, with stage 5 chronic kidney disease, or end stage renal disease: Secondary | ICD-10-CM | POA: Diagnosis not present

## 2016-04-09 DIAGNOSIS — Z992 Dependence on renal dialysis: Secondary | ICD-10-CM | POA: Insufficient documentation

## 2016-04-09 DIAGNOSIS — D631 Anemia in chronic kidney disease: Secondary | ICD-10-CM | POA: Diagnosis not present

## 2016-04-09 DIAGNOSIS — Z841 Family history of disorders of kidney and ureter: Secondary | ICD-10-CM

## 2016-04-09 DIAGNOSIS — I132 Hypertensive heart and chronic kidney disease with heart failure and with stage 5 chronic kidney disease, or end stage renal disease: Secondary | ICD-10-CM

## 2016-04-09 DIAGNOSIS — I469 Cardiac arrest, cause unspecified: Secondary | ICD-10-CM | POA: Diagnosis not present

## 2016-04-09 DIAGNOSIS — N186 End stage renal disease: Secondary | ICD-10-CM

## 2016-04-09 DIAGNOSIS — F17210 Nicotine dependence, cigarettes, uncomplicated: Secondary | ICD-10-CM

## 2016-04-09 DIAGNOSIS — Y832 Surgical operation with anastomosis, bypass or graft as the cause of abnormal reaction of the patient, or of later complication, without mention of misadventure at the time of the procedure: Secondary | ICD-10-CM | POA: Diagnosis present

## 2016-04-09 DIAGNOSIS — D72829 Elevated white blood cell count, unspecified: Secondary | ICD-10-CM | POA: Diagnosis not present

## 2016-04-09 DIAGNOSIS — M329 Systemic lupus erythematosus, unspecified: Secondary | ICD-10-CM | POA: Diagnosis present

## 2016-04-09 DIAGNOSIS — D638 Anemia in other chronic diseases classified elsewhere: Secondary | ICD-10-CM | POA: Diagnosis present

## 2016-04-09 DIAGNOSIS — Z9119 Patient's noncompliance with other medical treatment and regimen: Secondary | ICD-10-CM | POA: Insufficient documentation

## 2016-04-09 DIAGNOSIS — I5022 Chronic systolic (congestive) heart failure: Secondary | ICD-10-CM

## 2016-04-09 DIAGNOSIS — Z79899 Other long term (current) drug therapy: Secondary | ICD-10-CM | POA: Insufficient documentation

## 2016-04-09 DIAGNOSIS — M3214 Glomerular disease in systemic lupus erythematosus: Secondary | ICD-10-CM

## 2016-04-09 DIAGNOSIS — I509 Heart failure, unspecified: Secondary | ICD-10-CM | POA: Diagnosis not present

## 2016-04-09 DIAGNOSIS — I959 Hypotension, unspecified: Secondary | ICD-10-CM | POA: Diagnosis not present

## 2016-04-09 DIAGNOSIS — R531 Weakness: Secondary | ICD-10-CM | POA: Diagnosis not present

## 2016-04-09 DIAGNOSIS — E875 Hyperkalemia: Secondary | ICD-10-CM | POA: Diagnosis not present

## 2016-04-09 DIAGNOSIS — F250 Schizoaffective disorder, bipolar type: Secondary | ICD-10-CM | POA: Insufficient documentation

## 2016-04-09 DIAGNOSIS — D62 Acute posthemorrhagic anemia: Secondary | ICD-10-CM | POA: Diagnosis present

## 2016-04-09 DIAGNOSIS — Z91158 Patient's noncompliance with renal dialysis for other reason: Secondary | ICD-10-CM

## 2016-04-09 DIAGNOSIS — I4901 Ventricular fibrillation: Secondary | ICD-10-CM | POA: Diagnosis not present

## 2016-04-09 DIAGNOSIS — R58 Hemorrhage, not elsewhere classified: Secondary | ICD-10-CM | POA: Diagnosis not present

## 2016-04-09 DIAGNOSIS — R031 Nonspecific low blood-pressure reading: Secondary | ICD-10-CM | POA: Diagnosis not present

## 2016-04-09 DIAGNOSIS — Z7982 Long term (current) use of aspirin: Secondary | ICD-10-CM | POA: Insufficient documentation

## 2016-04-09 DIAGNOSIS — G4089 Other seizures: Secondary | ICD-10-CM | POA: Diagnosis present

## 2016-04-09 DIAGNOSIS — T82838A Hemorrhage of vascular prosthetic devices, implants and grafts, initial encounter: Principal | ICD-10-CM | POA: Diagnosis present

## 2016-04-09 DIAGNOSIS — G8929 Other chronic pain: Secondary | ICD-10-CM | POA: Diagnosis not present

## 2016-04-09 LAB — CBC
HCT: 31.3 % — ABNORMAL LOW (ref 36.0–46.0)
Hemoglobin: 10 g/dL — ABNORMAL LOW (ref 12.0–15.0)
MCH: 26.7 pg (ref 26.0–34.0)
MCHC: 31.9 g/dL (ref 30.0–36.0)
MCV: 83.7 fL (ref 78.0–100.0)
Platelets: 199 10*3/uL (ref 150–400)
RBC: 3.74 MIL/uL — ABNORMAL LOW (ref 3.87–5.11)
RDW: 18.6 % — ABNORMAL HIGH (ref 11.5–15.5)
WBC: 8.4 10*3/uL (ref 4.0–10.5)

## 2016-04-09 LAB — RENAL FUNCTION PANEL
Albumin: 3.5 g/dL (ref 3.5–5.0)
Anion gap: 19 — ABNORMAL HIGH (ref 5–15)
BUN: 97 mg/dL — ABNORMAL HIGH (ref 6–20)
CO2: 20 mmol/L — ABNORMAL LOW (ref 22–32)
Calcium: 9.9 mg/dL (ref 8.9–10.3)
Chloride: 95 mmol/L — ABNORMAL LOW (ref 101–111)
Creatinine, Ser: 15.03 mg/dL — ABNORMAL HIGH (ref 0.44–1.00)
GFR calc Af Amer: 3 mL/min — ABNORMAL LOW (ref >60)
GFR calc non Af Amer: 3 mL/min — ABNORMAL LOW (ref >60)
Glucose, Bld: 105 mg/dL — ABNORMAL HIGH (ref 65–99)
Phosphorus: 11.1 mg/dL — ABNORMAL HIGH (ref 2.5–4.6)
Potassium: 6.6 mmol/L (ref 3.5–5.1)
Sodium: 134 mmol/L — ABNORMAL LOW (ref 135–145)

## 2016-04-09 MED ORDER — LIDOCAINE-PRILOCAINE 2.5-2.5 % EX CREA: 1.0000 "application " | TOPICAL_CREAM | CUTANEOUS | Status: DC | PRN

## 2016-04-09 MED ORDER — ENOXAPARIN SODIUM 30 MG/0.3ML ~~LOC~~ SOLN: 30.0000 mg | SUBCUTANEOUS | Status: DC

## 2016-04-09 MED ORDER — ASPIRIN-ACETAMINOPHEN-CAFFEINE 250-250-65 MG PO TABS: 2.0000 | ORAL_TABLET | Freq: Four times a day (QID) | ORAL | Status: DC | PRN

## 2016-04-09 MED ORDER — RISPERIDONE MICROSPHERES 25 MG IM SUSR: 25.0000 mg | INTRAMUSCULAR | Status: DC

## 2016-04-09 MED ORDER — EPOETIN ALFA 4000 UNIT/ML IJ SOLN
4000.0000 [IU] | INTRAMUSCULAR | Status: DC
  Administered 2016-04-09: 4000 [IU] via SUBCUTANEOUS
  Filled 2016-04-09 (×2): qty 1

## 2016-04-09 MED ORDER — LIDOCAINE HCL (PF) 1 % IJ SOLN: 5.0000 mL | INTRAMUSCULAR | Status: DC | PRN

## 2016-04-09 MED ORDER — ASPIRIN 325 MG PO TABS
650.0000 mg | ORAL_TABLET | Freq: Four times a day (QID) | ORAL | Status: DC | PRN
Start: 2016-04-09 — End: 2016-04-10

## 2016-04-09 MED ORDER — SODIUM CHLORIDE 0.9 % IV SOLN: 100.0000 mL | INTRAVENOUS | Status: DC | PRN

## 2016-04-09 MED ORDER — ACETAMINOPHEN 500 MG PO TABS: 500.0000 mg | ORAL_TABLET | Freq: Four times a day (QID) | ORAL | Status: DC | PRN

## 2016-04-09 MED ORDER — DOXERCALCIFEROL 4 MCG/2ML IV SOLN
4.0000 ug | INTRAVENOUS | Status: DC
  Administered 2016-04-09: 4 ug via INTRAVENOUS
  Filled 2016-04-09 (×2): qty 2

## 2016-04-09 MED ORDER — METOPROLOL TARTRATE 50 MG PO TABS
50.0000 mg | ORAL_TABLET | Freq: Two times a day (BID) | ORAL | Status: DC
  Administered 2016-04-09: 50 mg via ORAL

## 2016-04-09 MED ORDER — PREDNISONE 20 MG PO TABS: 20.0000 mg | ORAL_TABLET | Freq: Every day | ORAL | Status: DC

## 2016-04-09 MED ORDER — PENTAFLUOROPROP-TETRAFLUOROETH EX AERO: 1.0000 "application " | INHALATION_SPRAY | CUTANEOUS | Status: DC | PRN

## 2016-04-09 MED ORDER — ALTEPLASE 2 MG IJ SOLR
2.0000 mg | Freq: Once | INTRAMUSCULAR | Status: DC | PRN
  Filled 2016-04-09: qty 2

## 2016-04-09 MED ORDER — TRAMADOL HCL 50 MG PO TABS: 50.0000 mg | ORAL_TABLET | Freq: Four times a day (QID) | ORAL | Status: DC | PRN

## 2016-04-09 MED ORDER — HEPARIN SODIUM (PORCINE) 1000 UNIT/ML DIALYSIS
1000.0000 [IU] | INTRAMUSCULAR | Status: DC | PRN
  Filled 2016-04-09: qty 1

## 2016-04-09 MED ORDER — ACETAMINOPHEN 500 MG PO TABS: 1000.0000 mg | ORAL_TABLET | Freq: Four times a day (QID) | ORAL | Status: DC | PRN

## 2016-04-09 MED ORDER — SEVELAMER CARBONATE 800 MG PO TABS
3200.0000 mg | ORAL_TABLET | Freq: Three times a day (TID) | ORAL | Status: DC
  Administered 2016-04-09: 3200 mg via ORAL
  Filled 2016-04-09 (×7): qty 4

## 2016-04-09 MED ORDER — HEPARIN SODIUM (PORCINE) 1000 UNIT/ML DIALYSIS
20.0000 [IU]/kg | INTRAMUSCULAR | Status: DC | PRN
  Filled 2016-04-09: qty 2

## 2016-04-09 NOTE — ED Notes (Addendum)
Pt taken to dialysis via wheelchair with ED NT.
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
## 2016-04-09 NOTE — Consult Note (Signed)
Sara [name] MRN: [id number] DOB/AGE: 10-02-1980 36 y.o. Primary Care Physician:Pcp Not In System Admit date: 04/09/2016 Chief Complaint:  Chief Complaint  Patient presents with  . Weakness   HPI: Pt is 36 year old female with past medical hx of ESRD who was admitted with c/o dyspnea," I need dialysis "  HPI dates 1-2 days ago as pt came to ER asking for her dialysis. Pt says " I need my dialysis , I got dialysis on Friday. I couldn't come on Monday as I didn't have a ride" No c/o fever/cough/chills NO c/o nausea/vomiting. NO c/o abdominal pain. No c/o hematuria . NO c/o syncope. I educated pt again  to please stay for full tx.   Past Medical History:  Diagnosis Date  . Acute myopericarditis    hx/notes 10/09/2014  . Bipolar disorder ([place])    [name] 10/09/2014  . CHF (congestive heart failure) ([place])    systolic/notes 123XX123  . Chronic anemia    [name] 10/09/2014  . Current use of steroid medication 12/21/2015  . ESRD (end stage renal disease) on dialysis [facility])    "[name]; [facility]" (10/09/2014)  . History of blood transfusion    "this is probably my 3rd" (10/09/2014)  . Hypertension   . Low back pain   . Lupus    lupus w nephritis  . 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 Non-compliant patient   . Positive ANA (antinuclear antibody) 08/16/2012  . Positive Smith antibody 08/16/2012  . Pregnancy   . Psychosis   . Schizoaffective disorder, bipolar type ([place]) 11/20/2014  . Schizophrenia ([place])    [name] 10/09/2014  . Tobacco abuse 02/20/2014        Family History  Problem Relation Age of Onset  . Drug abuse Father   . Kidney disease Father     Social History:  reports that she has been smoking Cigarettes.  She has a 2.00 pack-year smoking history. She has never used smokeless tobacco. She reports that she does not drink alcohol or use drugs.   Allergies:  Allergies  Allergen Reactions  . Ativan [Lorazepam] Swelling and Other (See Comments)    Reaction:  Difficulty  speaking/slurred speech   . Geodon [Ziprasidone Hcl] Itching and Swelling    Reaction:  Tongue swelling   . Keflex [Cephalexin] Swelling and Other (See Comments)    Reaction:  Tongue swelling/pt is unable to talk   . Haldol [Haloperidol Lactate] Swelling and Other (See Comments)    Reaction:  Tongue swelling -- 05/31/15  Pt is able to take with Benadryl.    . Other Itching and Other (See Comments)    Pt states that she is allergic to wool.      (Not in a hospital admission)     [id number] from the symptoms mentioned above,there are no other symptoms referable to all systems reviewed.      Physical Exam: Vital signs in last 24 hours: Temp:  [97.5 F (36.4 C)] 97.5 F (36.4 C) (01/03 0746) Pulse Rate:  [109] 109 (01/03 0746) Resp:  [18] 18 (01/03 0746) BP: (164)/(123) 164/123 (01/03 0746) SpO2:  [100 %] 100 % (01/03 0746) Weight:  [179 lb 11.2 oz (81.5 kg)] 179 lb 11.2 oz (81.5 kg) (01/03 0747) Weight change:     Intake/Output from previous day: No intake/output data recorded. No intake/output data recorded.   Physical Exam: General- pt is awake,alert, follows comands Resp- No acute REsp distress, chest clear to auscultataion. CVS- S1S2 regular in rate and rhythm, NO  rubs  GIT- BS+, soft, NT, ND EXT-No LE Edema, NO Cyanosis CNS- CN 2-12 grossly intact. Moving all 4 extremities Access- AVF+    Lab Results:  CBC    Component Value Date/Time   WBC 9.3 04/02/2016 1142   RBC 3.51 (L) 04/02/2016 1142   HGB 9.7 (L) 04/02/2016 1142   HCT 30.4 (L) 04/02/2016 1142   PLT 285 04/02/2016 1142   MCV 86.6 04/02/2016 1142   MCH 27.6 04/02/2016 1142   MCHC 31.9 04/02/2016 1142   RDW 19.1 (H) 04/02/2016 1142   LYMPHSABS 1.0 04/02/2016 1142   MONOABS 0.5 04/02/2016 1142   EOSABS 0.0 04/02/2016 1142   BASOSABS 0.0 04/02/2016 1142      BMET BMP Latest Ref Rng & Units 04/02/2016 03/28/2016 03/26/2016  Glucose 65 - 99 mg/dL 140(H) 90 105(H)  BUN 6 - 20 mg/dL 93(H)  73(H) 89(H)  Creatinine 0.44 - 1.00 mg/dL 13.42(H) 12.71(H) 14.72(H)  Sodium 135 - 145 mmol/L 132(L) 137 135  Potassium 3.5 - 5.1 mmol/L 5.8(H) 5.0 5.9(H)  Chloride 101 - 111 mmol/L 97(L) 99(L) 98(L)  CO2 22 - 32 mmol/L 20(L) 24 21(L)  Calcium 8.9 - 10.3 mg/dL 9.4 9.3 9.7         Lab Results  Component Value Date   PTH 1,021 (H) 02/29/2016   PTH Comment 02/29/2016   CALCIUM 9.4 04/02/2016   CAION 1.10 (L) 02/23/2016   PHOS 6.6 (H) 04/02/2016      Impression: 1)Renal  ESRD on HD                Pt is not on regular  Schedule sec to her complaince/adherence issues                Will dialyze pt today  2)HTN Bp is not at goal Hx of non adherence to medical tx  3)Anemia In ESRD the goal for HGb is 9--11. Pt HGb is  at goal Will keep on epo   4)CKD Mineral-Bone Disorder PTH high. Secondary Hyperparathyroidism  Present.   Not at goal sec to non adherence to hd tx   Phosphorus not at goal.  on binders  5)Psych .  Hx of   psycosis Schizophrenia Primary MD following  6)Electrolytes  Hx of Hyperkalemia      Sec to non adherence to HD   Hx of Hyponatremia   7)Acid base Co2  at goal    Plan:  Will dialyze today Will use 2 k bath Will keep on epo-4000 units  Will keep on hectrol-95mcg will dialyze for 4hrs ( if pt stays )  Will try to take 2.5 liters off if possible  I again educated pt about need to adhere to medical tx.    Sara [name] S 04/09/2016, 9:05 AM

## 2016-04-09 NOTE — ED Triage Notes (Signed)
Pt coming to hospital for dialysis. Reports weakness
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
## 2016-04-09 NOTE — Discharge Summary (Signed)
Patient left AMA  After hemodialysis.

## 2016-04-10 ENCOUNTER — Encounter (HOSPITAL_COMMUNITY): Payer: Self-pay | Admitting: Emergency Medicine

## 2016-04-10 ENCOUNTER — Inpatient Hospital Stay (HOSPITAL_COMMUNITY)
Admission: EM | Admit: 2016-04-10 | Discharge: 2016-05-08 | DRG: 252 | Disposition: E | Payer: Medicare Other | Attending: Pulmonary Disease | Admitting: Pulmonary Disease

## 2016-04-10 ENCOUNTER — Encounter (HOSPITAL_COMMUNITY): Payer: Self-pay

## 2016-04-10 ENCOUNTER — Emergency Department (HOSPITAL_COMMUNITY)
Admission: EM | Admit: 2016-04-10 | Discharge: 2016-04-10 | Disposition: A | Discharge status: Home / Self Care | Payer: Medicare Other | Source: Home / Self Care | Attending: Emergency Medicine | Admitting: Emergency Medicine

## 2016-04-10 DIAGNOSIS — Z992 Dependence on renal dialysis: Secondary | ICD-10-CM

## 2016-04-10 DIAGNOSIS — Z7982 Long term (current) use of aspirin: Secondary | ICD-10-CM | POA: Insufficient documentation

## 2016-04-10 DIAGNOSIS — T82838A Hemorrhage of vascular prosthetic devices, implants and grafts, initial encounter: Secondary | ICD-10-CM

## 2016-04-10 DIAGNOSIS — T829XXA Unspecified complication of cardiac and vascular prosthetic device, implant and graft, initial encounter: Secondary | ICD-10-CM

## 2016-04-10 DIAGNOSIS — N186 End stage renal disease: Secondary | ICD-10-CM | POA: Diagnosis not present

## 2016-04-10 DIAGNOSIS — F17210 Nicotine dependence, cigarettes, uncomplicated: Secondary | ICD-10-CM

## 2016-04-10 DIAGNOSIS — Z79899 Other long term (current) drug therapy: Secondary | ICD-10-CM

## 2016-04-10 DIAGNOSIS — I5022 Chronic systolic (congestive) heart failure: Secondary | ICD-10-CM

## 2016-04-10 DIAGNOSIS — I132 Hypertensive heart and chronic kidney disease with heart failure and with stage 5 chronic kidney disease, or end stage renal disease: Secondary | ICD-10-CM | POA: Insufficient documentation

## 2016-04-10 DIAGNOSIS — Y732 Prosthetic and other implants, materials and accessory gastroenterology and urology devices associated with adverse incidents: Secondary | ICD-10-CM | POA: Insufficient documentation

## 2016-04-10 DIAGNOSIS — R0600 Dyspnea, unspecified: Secondary | ICD-10-CM

## 2016-04-10 DIAGNOSIS — R58 Hemorrhage, not elsewhere classified: Secondary | ICD-10-CM

## 2016-04-10 LAB — CBC
HCT: 27.8 % — ABNORMAL LOW (ref 36.0–46.0)
Hemoglobin: 8.9 g/dL — ABNORMAL LOW (ref 12.0–15.0)
MCH: 26.6 pg (ref 26.0–34.0)
MCHC: 32 g/dL (ref 30.0–36.0)
MCV: 83.2 fL (ref 78.0–100.0)
PLATELETS: 149 10*3/uL — AB (ref 150–400)
RBC: 3.34 MIL/uL — AB (ref 3.87–5.11)
RDW: 19.2 % — ABNORMAL HIGH (ref 11.5–15.5)
WBC: 12 10*3/uL — AB (ref 4.0–10.5)

## 2016-04-10 LAB — CBC WITH DIFFERENTIAL/PLATELET
BASOS ABS: 0 10*3/uL (ref 0.0–0.1)
Basophils Relative: 0 %
Eosinophils Absolute: 0 10*3/uL (ref 0.0–0.7)
Eosinophils Relative: 0 %
HEMATOCRIT: 23.4 % — AB (ref 36.0–46.0)
Hemoglobin: 7.6 g/dL — ABNORMAL LOW (ref 12.0–15.0)
LYMPHS PCT: 4 %
Lymphs Abs: 0.7 10*3/uL (ref 0.7–4.0)
MCH: 27.2 pg (ref 26.0–34.0)
MCHC: 32.5 g/dL (ref 30.0–36.0)
MCV: 83.9 fL (ref 78.0–100.0)
MONOS PCT: 2 %
Monocytes Absolute: 0.4 10*3/uL (ref 0.1–1.0)
Neutro Abs: 16.9 10*3/uL — ABNORMAL HIGH (ref 1.7–7.7)
Neutrophils Relative %: 94 %
Platelets: 98 10*3/uL — ABNORMAL LOW (ref 150–400)
RBC: 2.79 MIL/uL — AB (ref 3.87–5.11)
RDW: 19.6 % — AB (ref 11.5–15.5)
WBC: 18 10*3/uL — ABNORMAL HIGH (ref 4.0–10.5)

## 2016-04-10 LAB — PROTIME-INR
INR: 1.61
Prothrombin Time: 19.3 seconds — ABNORMAL HIGH (ref 11.4–15.2)

## 2016-04-10 LAB — PREPARE RBC (CROSSMATCH)

## 2016-04-10 LAB — APTT: APTT: 31 s (ref 24–36)

## 2016-04-10 MED ORDER — ACETAMINOPHEN 325 MG PO TABS
650.0000 mg | ORAL_TABLET | Freq: Once | ORAL | Status: AC
  Administered 2016-04-10: 650 mg via ORAL
  Filled 2016-04-10: qty 2

## 2016-04-10 MED ORDER — "THROMBI-PAD 3""X3"" EX PADS"
1.0000 | MEDICATED_PAD | Freq: Once | CUTANEOUS | Status: AC
  Administered 2016-04-10: 1 via TOPICAL
  Filled 2016-04-10: qty 1

## 2016-04-10 MED ORDER — SODIUM CHLORIDE 0.9 % IV BOLUS (SEPSIS)
1000.0000 mL | Freq: Once | INTRAVENOUS | Status: AC
  Administered 2016-04-10: 1000 mL via INTRAVENOUS

## 2016-04-10 MED ORDER — SODIUM CHLORIDE 0.9 % IV SOLN
Freq: Once | INTRAVENOUS | Status: AC
  Administered 2016-04-11: 01:00:00 via INTRAVENOUS

## 2016-04-10 MED ORDER — HYDROCODONE-ACETAMINOPHEN 5-325 MG PO TABS
1.0000 | ORAL_TABLET | Freq: Once | ORAL | Status: AC
  Administered 2016-04-10: 1 via ORAL
  Filled 2016-04-10: qty 1

## 2016-04-10 NOTE — Discharge Instructions (Signed)
Keep bandage on until tomorrow when you go to dialysis. Return to ER if you notice significant bleeding through the bandage.

## 2016-04-10 NOTE — ED Triage Notes (Signed)
Pt. arrived with EMS from home reports bleeding right upper arm AV fistula after hemodialysis , no bleeding at arrival . Dressing applied prior to arrival .

## 2016-04-10 NOTE — ED Notes (Signed)
Per blood bank pt has a hx of antibody on her blood and type and screen takes longer time, pt needs blood from other supplier unable to determine how long it will take for blood to be available, blood bank to notified when blood is available.

## 2016-04-10 NOTE — ED Notes (Signed)
Notification from main lab that BMP is hemolyzed, lab tech notified to redraw labs.

## 2016-04-10 NOTE — ED Notes (Signed)
Pt remains alert and oriented x's 3 
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
## 2016-04-10 NOTE — Discharge Planning (Signed)
Pt up for discharge. EDCM reviewed chart for possible CM needs.  No needs identified or communicated.  

## 2016-04-10 NOTE — ED Triage Notes (Signed)
[facility]- pt is [name] dialysis pt, last had full treatment yesterday. Pt reports fistula bleeding that began yesterday after treatment. EMS reports bleeding was controlled on arrival. Approximately 150cc blood loss.

## 2016-04-10 NOTE — ED Notes (Signed)
Patient's bed was cancelled at 1600 on 04/09/2016.  Patient left the hospital AMA.  Patient's chart was left on the board.  This note is to remove patient's record from status board only.
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
## 2016-04-11 ENCOUNTER — Inpatient Hospital Stay (HOSPITAL_COMMUNITY): Payer: Medicare Other

## 2016-04-11 ENCOUNTER — Encounter (HOSPITAL_COMMUNITY): Admission: EM | Disposition: E | Payer: Self-pay | Source: Home / Self Care | Attending: Pulmonary Disease

## 2016-04-11 ENCOUNTER — Telehealth: Payer: Self-pay | Admitting: Vascular Surgery

## 2016-04-11 ENCOUNTER — Encounter (HOSPITAL_COMMUNITY): Payer: Self-pay | Admitting: Certified Registered"

## 2016-04-11 ENCOUNTER — Inpatient Hospital Stay (HOSPITAL_COMMUNITY): Payer: Medicare Other | Admitting: Certified Registered"

## 2016-04-11 DIAGNOSIS — I509 Heart failure, unspecified: Secondary | ICD-10-CM | POA: Diagnosis present

## 2016-04-11 DIAGNOSIS — I469 Cardiac arrest, cause unspecified: Secondary | ICD-10-CM | POA: Diagnosis not present

## 2016-04-11 DIAGNOSIS — Z992 Dependence on renal dialysis: Secondary | ICD-10-CM | POA: Diagnosis not present

## 2016-04-11 DIAGNOSIS — T82838A Hemorrhage of vascular prosthetic devices, implants and grafts, initial encounter: Principal | ICD-10-CM

## 2016-04-11 DIAGNOSIS — D72829 Elevated white blood cell count, unspecified: Secondary | ICD-10-CM | POA: Diagnosis present

## 2016-04-11 DIAGNOSIS — I120 Hypertensive chronic kidney disease with stage 5 chronic kidney disease or end stage renal disease: Secondary | ICD-10-CM | POA: Diagnosis not present

## 2016-04-11 DIAGNOSIS — Z01810 Encounter for preprocedural cardiovascular examination: Secondary | ICD-10-CM

## 2016-04-11 DIAGNOSIS — F17210 Nicotine dependence, cigarettes, uncomplicated: Secondary | ICD-10-CM | POA: Diagnosis present

## 2016-04-11 DIAGNOSIS — Z841 Family history of disorders of kidney and ureter: Secondary | ICD-10-CM | POA: Diagnosis not present

## 2016-04-11 DIAGNOSIS — M329 Systemic lupus erythematosus, unspecified: Secondary | ICD-10-CM | POA: Diagnosis present

## 2016-04-11 DIAGNOSIS — G8929 Other chronic pain: Secondary | ICD-10-CM | POA: Diagnosis present

## 2016-04-11 DIAGNOSIS — Z9119 Patient's noncompliance with other medical treatment and regimen: Secondary | ICD-10-CM | POA: Diagnosis not present

## 2016-04-11 DIAGNOSIS — Z452 Encounter for adjustment and management of vascular access device: Secondary | ICD-10-CM | POA: Diagnosis not present

## 2016-04-11 DIAGNOSIS — Z7952 Long term (current) use of systemic steroids: Secondary | ICD-10-CM | POA: Diagnosis not present

## 2016-04-11 DIAGNOSIS — Z79899 Other long term (current) drug therapy: Secondary | ICD-10-CM | POA: Diagnosis not present

## 2016-04-11 DIAGNOSIS — G4089 Other seizures: Secondary | ICD-10-CM | POA: Diagnosis present

## 2016-04-11 DIAGNOSIS — R58 Hemorrhage, not elsewhere classified: Secondary | ICD-10-CM

## 2016-04-11 DIAGNOSIS — D638 Anemia in other chronic diseases classified elsewhere: Secondary | ICD-10-CM | POA: Diagnosis present

## 2016-04-11 DIAGNOSIS — N186 End stage renal disease: Secondary | ICD-10-CM

## 2016-04-11 DIAGNOSIS — I959 Hypotension, unspecified: Secondary | ICD-10-CM | POA: Diagnosis not present

## 2016-04-11 DIAGNOSIS — I132 Hypertensive heart and chronic kidney disease with heart failure and with stage 5 chronic kidney disease, or end stage renal disease: Secondary | ICD-10-CM | POA: Diagnosis not present

## 2016-04-11 DIAGNOSIS — E875 Hyperkalemia: Secondary | ICD-10-CM | POA: Diagnosis not present

## 2016-04-11 DIAGNOSIS — F250 Schizoaffective disorder, bipolar type: Secondary | ICD-10-CM | POA: Diagnosis present

## 2016-04-11 DIAGNOSIS — D62 Acute posthemorrhagic anemia: Secondary | ICD-10-CM | POA: Diagnosis present

## 2016-04-11 DIAGNOSIS — Y832 Surgical operation with anastomosis, bypass or graft as the cause of abnormal reaction of the patient, or of later complication, without mention of misadventure at the time of the procedure: Secondary | ICD-10-CM | POA: Diagnosis present

## 2016-04-11 DIAGNOSIS — I4901 Ventricular fibrillation: Secondary | ICD-10-CM | POA: Diagnosis not present

## 2016-04-11 HISTORY — PX: INSERTION OF DIALYSIS CATHETER: SHX1324

## 2016-04-11 HISTORY — PX: REVISION OF ARTERIOVENOUS GORETEX GRAFT: SHX6073

## 2016-04-11 LAB — POCT I-STAT, CHEM 8
BUN: 61 mg/dL — AB (ref 6–20)
CALCIUM ION: 1.76 mmol/L — AB (ref 1.15–1.40)
Chloride: 107 mmol/L (ref 101–111)
Creatinine, Ser: 9.2 mg/dL — ABNORMAL HIGH (ref 0.44–1.00)
Glucose, Bld: 160 mg/dL — ABNORMAL HIGH (ref 65–99)
HEMATOCRIT: 18 % — AB (ref 36.0–46.0)
Hemoglobin: 6.1 g/dL — CL (ref 12.0–15.0)
Potassium: 5.1 mmol/L (ref 3.5–5.1)
SODIUM: 139 mmol/L (ref 135–145)
TCO2: 20 mmol/L (ref 0–100)

## 2016-04-11 LAB — POCT I-STAT 3, VENOUS BLOOD GAS (G3P V)
ACID-BASE DEFICIT: 14 mmol/L — AB (ref 0.0–2.0)
Bicarbonate: 17.1 mmol/L — ABNORMAL LOW (ref 20.0–28.0)
O2 Saturation: 41 %
PCO2 VEN: 76.6 mmHg — AB (ref 44.0–60.0)
PH VEN: 6.957 — AB (ref 7.250–7.430)
PO2 VEN: 37 mmHg (ref 32.0–45.0)
TCO2: 19 mmol/L (ref 0–100)

## 2016-04-11 LAB — CBC
HEMATOCRIT: 24.2 % — AB (ref 36.0–46.0)
Hemoglobin: 8 g/dL — ABNORMAL LOW (ref 12.0–15.0)
MCH: 26.6 pg (ref 26.0–34.0)
MCHC: 33.1 g/dL (ref 30.0–36.0)
MCV: 80.4 fL (ref 78.0–100.0)
Platelets: 89 10*3/uL — ABNORMAL LOW (ref 150–400)
RBC: 3.01 MIL/uL — AB (ref 3.87–5.11)
RDW: 17.2 % — ABNORMAL HIGH (ref 11.5–15.5)
WBC: 29.7 10*3/uL — AB (ref 4.0–10.5)

## 2016-04-11 LAB — MRSA PCR SCREENING: MRSA by PCR: NEGATIVE

## 2016-04-11 LAB — BASIC METABOLIC PANEL
ANION GAP: 18 — AB (ref 5–15)
Anion gap: 13 (ref 5–15)
BUN: 62 mg/dL — AB (ref 6–20)
BUN: 64 mg/dL — AB (ref 6–20)
CALCIUM: 8.4 mg/dL — AB (ref 8.9–10.3)
CALCIUM: 8.9 mg/dL (ref 8.9–10.3)
CO2: 21 mmol/L — ABNORMAL LOW (ref 22–32)
CO2: 22 mmol/L (ref 22–32)
CREATININE: 11.34 mg/dL — AB (ref 0.44–1.00)
Chloride: 101 mmol/L (ref 101–111)
Chloride: 99 mmol/L — ABNORMAL LOW (ref 101–111)
Creatinine, Ser: 11.54 mg/dL — ABNORMAL HIGH (ref 0.44–1.00)
GFR calc Af Amer: 4 mL/min — ABNORMAL LOW (ref >60)
GFR calc Af Amer: 4 mL/min — ABNORMAL LOW (ref >60)
GFR, EST NON AFRICAN AMERICAN: 4 mL/min — AB (ref >60)
GFR, EST NON AFRICAN AMERICAN: 4 mL/min — AB (ref >60)
GLUCOSE: 113 mg/dL — AB (ref 65–99)
GLUCOSE: 66 mg/dL (ref 65–99)
Potassium: 4.5 mmol/L (ref 3.5–5.1)
Potassium: 5.3 mmol/L — ABNORMAL HIGH (ref 3.5–5.1)
SODIUM: 138 mmol/L (ref 135–145)
Sodium: 136 mmol/L (ref 135–145)

## 2016-04-11 LAB — LACTIC ACID, PLASMA: LACTIC ACID, VENOUS: 1.9 mmol/L (ref 0.5–1.9)

## 2016-04-11 LAB — PREPARE RBC (CROSSMATCH)

## 2016-04-11 LAB — POCT I-STAT 4, (NA,K, GLUC, HGB,HCT)
Glucose, Bld: 90 mg/dL (ref 65–99)
HEMATOCRIT: 22 % — AB (ref 36.0–46.0)
HEMOGLOBIN: 7.5 g/dL — AB (ref 12.0–15.0)
Potassium: 4.7 mmol/L (ref 3.5–5.1)
Sodium: 136 mmol/L (ref 135–145)

## 2016-04-11 SURGERY — REVISION OF ARTERIOVENOUS GORETEX GRAFT
Anesthesia: General | Site: Neck | Laterality: Right

## 2016-04-11 MED ORDER — OXYCODONE-ACETAMINOPHEN 5-325 MG PO TABS: 1.0000 | ORAL_TABLET | Freq: Four times a day (QID) | ORAL | Status: DC | PRN

## 2016-04-11 MED ORDER — ETOMIDATE 2 MG/ML IV SOLN
INTRAVENOUS | Status: DC | PRN
  Administered 2016-04-11: 14 mg via INTRAVENOUS

## 2016-04-11 MED ORDER — PHENYLEPHRINE HCL 10 MG/ML IJ SOLN
INTRAVENOUS | Status: DC | PRN
  Administered 2016-04-11: 50 ug/min via INTRAVENOUS

## 2016-04-11 MED ORDER — TRAMADOL HCL 50 MG PO TABS: 50.0000 mg | ORAL_TABLET | Freq: Four times a day (QID) | ORAL | Status: DC | PRN

## 2016-04-11 MED ORDER — MIDAZOLAM HCL 2 MG/2ML IJ SOLN
INTRAMUSCULAR | Status: AC
  Filled 2016-04-11: qty 2

## 2016-04-11 MED ORDER — SODIUM CHLORIDE 0.9 % IV SOLN: Freq: Once | INTRAVENOUS | Status: DC

## 2016-04-11 MED ORDER — SEVELAMER CARBONATE 800 MG PO TABS: 3200.0000 mg | ORAL_TABLET | Freq: Three times a day (TID) | ORAL | Status: DC

## 2016-04-11 MED ORDER — SODIUM CHLORIDE 0.9 % IV SOLN
INTRAVENOUS | Status: DC | PRN
  Administered 2016-04-11: 1000 mg via INTRAVENOUS

## 2016-04-11 MED ORDER — FENTANYL CITRATE (PF) 100 MCG/2ML IJ SOLN
INTRAMUSCULAR | Status: AC
  Filled 2016-04-11: qty 2

## 2016-04-11 MED ORDER — SODIUM CHLORIDE 0.9 % IV SOLN
INTRAVENOUS | Status: DC | PRN
  Administered 2016-04-11: 05:00:00

## 2016-04-11 MED ORDER — NEOSTIGMINE METHYLSULFATE 10 MG/10ML IV SOLN
INTRAVENOUS | Status: DC | PRN
  Administered 2016-04-11: 3 mg via INTRAVENOUS

## 2016-04-11 MED ORDER — LIDOCAINE HCL (PF) 1 % IJ SOLN
INTRAMUSCULAR | Status: AC
  Filled 2016-04-11: qty 30

## 2016-04-11 MED ORDER — SODIUM CHLORIDE 0.9 % IV SOLN
Freq: Once | INTRAVENOUS | Status: AC
  Administered 2016-04-11: 04:00:00 via INTRAVENOUS

## 2016-04-11 MED ORDER — ONDANSETRON HCL 4 MG/2ML IJ SOLN
INTRAMUSCULAR | Status: DC | PRN
  Administered 2016-04-11: 4 mg via INTRAVENOUS

## 2016-04-11 MED ORDER — ALBUMIN HUMAN 25 % IV SOLN
25.0000 g | Freq: Once | INTRAVENOUS | Status: AC
  Administered 2016-04-11: 25 g via INTRAVENOUS
  Filled 2016-04-11: qty 50

## 2016-04-11 MED ORDER — GLYCOPYRROLATE 0.2 MG/ML IJ SOLN
INTRAMUSCULAR | Status: DC | PRN
  Administered 2016-04-11: .4 mg via INTRAVENOUS

## 2016-04-11 MED ORDER — HYDROMORPHONE HCL 1 MG/ML IJ SOLN: 0.2500 mg | INTRAMUSCULAR | Status: DC | PRN

## 2016-04-11 MED ORDER — 0.9 % SODIUM CHLORIDE (POUR BTL) OPTIME
TOPICAL | Status: DC | PRN
  Administered 2016-04-11: 1000 mL

## 2016-04-11 MED ORDER — ROCURONIUM BROMIDE 100 MG/10ML IV SOLN
INTRAVENOUS | Status: DC | PRN
  Administered 2016-04-11: 30 mg via INTRAVENOUS

## 2016-04-11 MED ORDER — SODIUM CHLORIDE 0.9 % IV SOLN: 100.0000 mL | INTRAVENOUS | Status: DC | PRN

## 2016-04-11 MED ORDER — HEPARIN SODIUM (PORCINE) 1000 UNIT/ML IJ SOLN
INTRAMUSCULAR | Status: DC | PRN
  Administered 2016-04-11: 3 mL via INTRAVENOUS

## 2016-04-11 MED ORDER — PENTAFLUOROPROP-TETRAFLUOROETH EX AERO: 1.0000 "application " | INHALATION_SPRAY | CUTANEOUS | Status: DC | PRN

## 2016-04-11 MED ORDER — LIDOCAINE HCL (CARDIAC) 20 MG/ML IV SOLN
INTRAVENOUS | Status: DC | PRN
  Administered 2016-04-11: 100 mg via INTRAVENOUS

## 2016-04-11 MED ORDER — PHENYLEPHRINE HCL 10 MG/ML IJ SOLN
100.0000 ug/min | INTRAVENOUS | Status: DC
  Administered 2016-04-11: 30 ug/min via INTRAVENOUS
  Filled 2016-04-11 (×2): qty 1

## 2016-04-11 MED ORDER — OXYCODONE-ACETAMINOPHEN 5-325 MG PO TABS
1.0000 | ORAL_TABLET | Freq: Once | ORAL | Status: AC
  Administered 2016-04-11: 1 via ORAL
  Filled 2016-04-11: qty 1

## 2016-04-11 MED ORDER — HEPARIN SODIUM (PORCINE) 1000 UNIT/ML IJ SOLN
INTRAMUSCULAR | Status: AC
  Filled 2016-04-11: qty 1

## 2016-04-11 MED ORDER — LIDOCAINE-PRILOCAINE 2.5-2.5 % EX CREA: 1.0000 "application " | TOPICAL_CREAM | CUTANEOUS | Status: DC | PRN

## 2016-04-11 MED ORDER — VANCOMYCIN HCL IN DEXTROSE 1-5 GM/200ML-% IV SOLN
INTRAVENOUS | Status: AC
  Filled 2016-04-11: qty 200

## 2016-04-11 MED ORDER — EPINEPHRINE PF 1 MG/10ML IJ SOSY
PREFILLED_SYRINGE | INTRAMUSCULAR | Status: AC
  Filled 2016-04-11: qty 10

## 2016-04-11 MED ORDER — PREDNISONE 10 MG PO TABS: 20.0000 mg | ORAL_TABLET | Freq: Every day | ORAL | Status: DC

## 2016-04-11 MED ORDER — SODIUM CHLORIDE 0.9 % IV SOLN
INTRAVENOUS | Status: DC
  Administered 2016-04-11: 04:00:00 via INTRAVENOUS

## 2016-04-11 MED ORDER — ALTEPLASE 2 MG IJ SOLR: 2.0000 mg | Freq: Once | INTRAMUSCULAR | Status: DC | PRN

## 2016-04-11 MED ORDER — HEPARIN SODIUM (PORCINE) 1000 UNIT/ML DIALYSIS: 1000.0000 [IU] | INTRAMUSCULAR | Status: DC | PRN

## 2016-04-11 MED ORDER — LIDOCAINE HCL (PF) 1 % IJ SOLN: 5.0000 mL | INTRAMUSCULAR | Status: DC | PRN

## 2016-04-11 MED ORDER — FENTANYL CITRATE (PF) 100 MCG/2ML IJ SOLN
INTRAMUSCULAR | Status: DC | PRN
  Administered 2016-04-11 (×4): 50 ug via INTRAVENOUS

## 2016-04-11 MED ORDER — SUCCINYLCHOLINE CHLORIDE 20 MG/ML IJ SOLN
INTRAMUSCULAR | Status: DC | PRN
  Administered 2016-04-11: 120 mg via INTRAVENOUS

## 2016-04-11 MED ORDER — ALBUMIN HUMAN 25 % IV SOLN
INTRAVENOUS | Status: AC
  Filled 2016-04-11: qty 100

## 2016-04-11 MED ORDER — ONDANSETRON HCL 4 MG/2ML IJ SOLN: 4.0000 mg | Freq: Once | INTRAMUSCULAR | Status: DC | PRN

## 2016-04-11 MED ORDER — MEPERIDINE HCL 25 MG/ML IJ SOLN: 6.2500 mg | INTRAMUSCULAR | Status: DC | PRN

## 2016-04-11 MED ORDER — EPINEPHRINE PF 1 MG/10ML IJ SOSY
PREFILLED_SYRINGE | INTRAMUSCULAR | Status: AC
  Filled 2016-04-11: qty 30

## 2016-04-11 SURGICAL SUPPLY — 48 items
ADH SKN CLS APL DERMABOND .7 (GAUZE/BANDAGES/DRESSINGS) ×2
ARMBAND PINK RESTRICT EXTREMIT (MISCELLANEOUS) ×6 IMPLANT
BANDAGE ACE 4X5 VEL STRL LF (GAUZE/BANDAGES/DRESSINGS) ×1 IMPLANT
BIOPATCH WHT 1IN DISK W/4.0 H (GAUZE/BANDAGES/DRESSINGS) ×1 IMPLANT
CANISTER SUCTION 2500CC (MISCELLANEOUS) ×3 IMPLANT
CLIP TI MEDIUM 6 (CLIP) ×3 IMPLANT
CLIP TI WIDE RED SMALL 6 (CLIP) ×3 IMPLANT
DERMABOND ADVANCED (GAUZE/BANDAGES/DRESSINGS) ×1
DERMABOND ADVANCED .7 DNX12 (GAUZE/BANDAGES/DRESSINGS) ×2 IMPLANT
DRAPE C-ARM 42X72 X-RAY (DRAPES) ×1 IMPLANT
DRAPE CHEST BREAST 15X10 FENES (DRAPES) ×1 IMPLANT
ELECT REM PT RETURN 9FT ADLT (ELECTROSURGICAL) ×3
ELECTRODE REM PT RTRN 9FT ADLT (ELECTROSURGICAL) ×2 IMPLANT
GAUZE SPONGE 2X2 8PLY STRL LF (GAUZE/BANDAGES/DRESSINGS) IMPLANT
GEL ULTRASOUND 20GR AQUASONIC (MISCELLANEOUS) IMPLANT
GLOVE BIO SURGEON STRL SZ 6.5 (GLOVE) ×1 IMPLANT
GLOVE BIO SURGEON STRL SZ7.5 (GLOVE) ×4 IMPLANT
GLOVE BIOGEL M 6.5 STRL (GLOVE) ×1 IMPLANT
GLOVE BIOGEL PI IND STRL 6.5 (GLOVE) IMPLANT
GLOVE BIOGEL PI IND STRL 7.5 (GLOVE) IMPLANT
GLOVE BIOGEL PI INDICATOR 6.5 (GLOVE) ×3
GLOVE BIOGEL PI INDICATOR 7.5 (GLOVE) ×1
GOWN STRL REUS W/ TWL LRG LVL3 (GOWN DISPOSABLE) ×4 IMPLANT
GOWN STRL REUS W/ TWL XL LVL3 (GOWN DISPOSABLE) ×2 IMPLANT
GOWN STRL REUS W/TWL LRG LVL3 (GOWN DISPOSABLE) ×15
GOWN STRL REUS W/TWL XL LVL3 (GOWN DISPOSABLE) ×3
INSERT FOGARTY SM (MISCELLANEOUS) ×3 IMPLANT
KIT BASIN OR (CUSTOM PROCEDURE TRAY) ×3 IMPLANT
KIT ROOM TURNOVER OR (KITS) ×3 IMPLANT
NS IRRIG 1000ML POUR BTL (IV SOLUTION) ×3 IMPLANT
PACK CV ACCESS (CUSTOM PROCEDURE TRAY) ×3 IMPLANT
PAD ARMBOARD 7.5X6 YLW CONV (MISCELLANEOUS) ×6 IMPLANT
SPONGE GAUZE 2X2 STER 10/PKG (GAUZE/BANDAGES/DRESSINGS) ×1
SPONGE GAUZE 4X4 12PLY STER LF (GAUZE/BANDAGES/DRESSINGS) ×1 IMPLANT
SUT ETHILON 3 0 PS 1 (SUTURE) ×4 IMPLANT
SUT GORETEX 6.0 TH-9 30 IN (SUTURE) ×3 IMPLANT
SUT GORETEX CV-6TTC-13 36IN (SUTURE) ×3 IMPLANT
SUT MNCRL AB 4-0 PS2 18 (SUTURE) ×1 IMPLANT
SUT PROLENE 4 0 SH DA (SUTURE) ×2 IMPLANT
SUT PROLENE 5 0 C 1 24 (SUTURE) ×1 IMPLANT
SUT PROLENE 6 0 BV (SUTURE) ×6 IMPLANT
SUT VIC AB 3-0 SH 27 (SUTURE) ×6
SUT VIC AB 3-0 SH 27X BRD (SUTURE) ×4 IMPLANT
SYR 30ML LL (SYRINGE) ×2 IMPLANT
SYR 5ML LL (SYRINGE) ×1 IMPLANT
TAPE CLOTH SURG 4X10 WHT LF (GAUZE/BANDAGES/DRESSINGS) ×1 IMPLANT
UNDERPAD 30X30 (UNDERPADS AND DIAPERS) ×3 IMPLANT
WATER STERILE IRR 1000ML POUR (IV SOLUTION) ×3 IMPLANT

## 2016-04-12 LAB — TYPE AND SCREEN
BLOOD PRODUCT EXPIRATION DATE: 201801292359
BLOOD PRODUCT EXPIRATION DATE: 201802062359
ISSUE DATE / TIME: 201801050417
ISSUE DATE / TIME: 201801050010
UNIT TYPE AND RH: 5100
Unit Type and Rh: 5100

## 2016-04-14 LAB — CULTURE, BLOOD (ROUTINE X 2)

## 2016-04-15 ENCOUNTER — Telehealth: Payer: Self-pay

## 2016-04-15 ENCOUNTER — Encounter (HOSPITAL_COMMUNITY): Payer: Self-pay | Admitting: Vascular Surgery

## 2016-04-15 NOTE — Telephone Encounter (Signed)
On 05-14-2016 I received a death certificate from [employer] (original). The death certificate is for burial. The patient is a patient of [facility] [employer]. The death certificate will be taken to Pulmonary Unit @ [facility] this morning for signature.  On May 14, 2016 I received the death certificate back from [place]. I got the death certificate ready and called the funeral home to let them know the death certificate is ready for pickup. I also faxed a copy to the funeral home per the funeral home request.

## 2016-05-02 ENCOUNTER — Encounter: Payer: Medicaid Other | Admitting: Vascular Surgery

## 2016-05-08 NOTE — Telephone Encounter (Signed)
Automated answering system states no VM for her #, unable to reach pt via phone, mailed letter for f/u 1/26 Unable to f/u in 2 wks as req due to doc out on vacation

## 2016-05-08 NOTE — Procedures (Signed)
Intubation Procedure Note NIKESHA KWASNY [id number] 11-29-1980  Procedure: Intubation Indications: Airway protection and maintenance  Procedure Details Consent: Unable to obtain consent because of emergent medical necessity. Time Out: Verified patient identification, verified procedure, site/side was marked, verified correct patient position, special equipment/implants available, medications/allergies/relevent history reviewed, required imaging and test results available.  Performed  Maximum sterile technique was used including gloves, gown, hand hygiene and mask.  MAC and 4    Evaluation Hemodynamic Status: BP stable throughout; O2 sats: intubated during code blue Patient's Current Condition: pt expired Complications: No apparent complications Patient did tolerate procedure well. Chest X-ray ordered to verify placement.  CXR: pending   Pt intubated during Code Blue using glidescope #4 blade with 7.5 ett secured at 25 at the top lip. Bilateral BS, direct visualization, positive color change noted on etco2 dectector, ETCO2 on monitor within normal limits. Pt manually ventilated throughout duration of code. RT will continue to monitor.    Jesse Sans 04/29/16

## 2016-05-08 NOTE — H&P (Signed)
PULMONARY / CRITICAL CARE MEDICINE   Name: Sara VANWEELDEN MRN: [id number] DOB: 03/13/1981    ADMISSION DATE:  04/16/2016 CONSULTATION DATE:  1/5  REFERRING MD:  Dr. Christy Gentles  CHIEF COMPLAINT:  Bleeding from AVF  HISTORY OF PRESENT ILLNESS:   36 year old female with PMH as below, which is significant for ESRD on HD ([name]) secondary to Lupus, CHF, bipolar disorder, and schizoaffective disorder. She gets HD at [facility] and presents through ED which accounts for the number of frequent admissions she has. Last treatment was 1/3. After dialysis she had some issues with her fistula bleeding, but this was relatively light and not overall concerning. 1/4 late PM she had a significant amount of hemorrhage from AVF and presented to the ED for this reason. Initially in ED the bleeding had subsided, but then it erupted while in waiting room and she was rushed back to trauma bay. EDP placed 5 sutures and she was evaluated by vascular surgery who recommended fisulogram during this hospitalization. She was hypotensive and given one unit PRBC, but still remained hypotensive. She is unable to get additional transfusions at this time due to anitbody. PCCM asked to admit.  PAST MEDICAL HISTORY :  She  has a past medical history of Acute myopericarditis; Bipolar disorder ([place]); CHF (congestive heart failure) ([place]); Chronic anemia; Current use of steroid medication (12/21/2015); ESRD (end stage renal disease) on dialysis [facility]); History of blood transfusion; Hypertension; Low back pain; Lupus; Lupus nephritis ([place]) (08/19/2012); Non-compliant patient; Positive ANA (antinuclear antibody) (08/16/2012); Positive Smith antibody (08/16/2012); Pregnancy; Psychosis; Schizoaffective disorder, bipolar type ([place]) (11/20/2014); Schizophrenia ([place]); and Tobacco abuse (02/20/2014).  PAST SURGICAL HISTORY: She  has a past surgical history that includes AV fistula placement (Right, 03/2013); AV fistula repair (Right, 2015); head  surgery (2005); and AV fistula placement (Right, 03/10/2013).  Allergies  Allergen Reactions  . Ativan [Lorazepam] Swelling and Other (See Comments)    Reaction:  Difficulty speaking/slurred speech   . Geodon [Ziprasidone Hcl] Itching and Swelling    Reaction:  Tongue swelling   . Keflex [Cephalexin] Swelling and Other (See Comments)    Reaction:  Tongue swelling/pt is unable to talk   . Haldol [Haloperidol Lactate] Swelling and Other (See Comments)    Reaction:  Tongue swelling -- 05/31/15  Pt is able to take with Benadryl.    . Other Itching and Other (See Comments)    Pt states that she is allergic to wool.     No current facility-administered medications on file prior to encounter.    Current Outpatient Prescriptions on File Prior to Encounter  Medication Sig  . acetaminophen (TYLENOL) 500 MG tablet Take 1,000 mg by mouth every 6 (six) hours as needed for mild pain, moderate pain or headache.   Marland Kitchen aspirin-acetaminophen-caffeine (EXCEDRIN MIGRAINE) 250-250-65 MG tablet Take 2 tablets by mouth every 6 (six) hours as needed for headache.  . metoprolol (LOPRESSOR) 50 MG tablet Take 1 tablet (50 mg total) by mouth 2 (two) times daily.  . predniSONE (DELTASONE) 20 MG tablet Take 1 tablet (20 mg total) by mouth daily with breakfast.  . risperiDONE microspheres (RISPERDAL CONSTA) 25 MG injection Inject 25 mg into the muscle every 14 (fourteen) days.  . sevelamer carbonate (RENVELA) 800 MG tablet Take 4 tablets (3,200 mg total) by mouth 3 (three) times daily with meals.  . traMADol (ULTRAM) 50 MG tablet Take 1 tablet (50 mg total) by mouth every 6 (six) hours as needed for moderate pain.  FAMILY HISTORY:  Her indicated that her mother is alive. She indicated that her father is alive. She indicated that her sister is alive. She indicated that her brother is alive.    SOCIAL HISTORY: She  reports that she has been smoking Cigarettes.  She has a 2.00 pack-year smoking history. She has never  used smokeless tobacco. She reports that she does not drink alcohol or use drugs.  REVIEW OF SYSTEMS:   Bolds are positive  Constitutional: weight loss, gain, night sweats, Fevers, chills, fatigue .  HEENT: headaches, Sore throat, sneezing, nasal congestion, post nasal drip, Difficulty swallowing, Tooth/dental problems, visual complaints visual changes, ear ache CV:  chest pain, radiates: ,Orthopnea, PND, swelling in lower extremities, dizziness, palpitations, syncope.  GI  heartburn, indigestion, abdominal pain, nausea, vomiting, diarrhea, change in bowel habits, loss of appetite, bloody stools.  Resp: cough, productive: , hemoptysis, dyspnea, chest pain, pleuritic.  Skin: rash or itching or icterus GU: dysuria, change in color of urine, urgency or frequency. flank pain, hematuria  MS: back pain or swelling. decreased range of motion  Psych: change in mood or affect. depression or anxiety.  Neuro: difficulty with speech, weakness, numbness, ataxia    SUBJECTIVE:    VITAL SIGNS: BP (!) 82/64   Pulse 120   Temp 97.6 F (36.4 C) (Oral)   Resp 23   SpO2 100%   HEMODYNAMICS:    VENTILATOR SETTINGS:    INTAKE / OUTPUT: No intake/output data recorded.  PHYSICAL EXAMINATION: General:  Young female in NAD Neuro:  Alert, oriented, non-focal HEENT: [place]/AT, PERRL, no JVD Cardiovascular:  RRR, 2/6 SEM Lungs:  Clear Abdomen:  Distended, fluctuant. Non-tender Musculoskeletal:  No acute deformity Skin:  RUE AVF hemostatic.   LABS:  BMET  Recent Labs Lab 04/09/16 1010 04/16/2016 2350  NA 134* 138  K 6.6* 4.5  CL 95* 99*  CO2 20* 21*  BUN 97* 64*  CREATININE 15.03* 11.54*  GLUCOSE 105* 66    Electrolytes  Recent Labs Lab 04/09/16 1010 04/28/2016 2350  CALCIUM 9.9 8.9  PHOS 11.1*  --     CBC  Recent Labs Lab 04/09/16 1010 04/21/2016 0834 05/02/2016 2241  WBC 8.4 12.0* 18.0*  HGB 10.0* 8.9* 7.6*  HCT 31.3* 27.8* 23.4*  PLT 199 149* 98*    Coag's  Recent  Labs Lab 05/02/2016 2241  APTT 31  INR 1.61    Sepsis Markers No results for input(s): LATICACIDVEN, PROCALCITON, O2SATVEN in the last 168 hours.  ABG No results for input(s): PHART, PCO2ART, PO2ART in the last 168 hours.  Liver Enzymes  Recent Labs Lab 04/09/16 1010  ALBUMIN 3.5    Cardiac Enzymes No results for input(s): TROPONINI, PROBNP in the last 168 hours.  Glucose No results for input(s): GLUCAP in the last 168 hours.  Imaging No results found.   STUDIES:    CULTURES:   ANTIBIOTICS:   SIGNIFICANT EVENTS:   LINES/TUBES:   DISCUSSION:   ASSESSMENT / PLAN:  PULMONARY A: No acute issues  P:     CARDIOVASCULAR A:  Hypotension secondary to hemorrhage H/o HTN  P:  Telemetry monitoring Holding home metoprolol Transfuse an additional unit PRBC once matched bag is found.  Gentle IVF resuscitation Trend CBC Hold off pressors for now Vascular following for AVF care  RENAL A:   ESRD on HD  P:   Consult nephrology in AM  GASTROINTESTINAL A:   No acute issues  P:   Renal diet  HEMATOLOGIC A:  ABLA on anemia of chronic illness  P:  Follow CBC Transfuse one additional unit as above  INFECTIOUS A:   No acute issues  P:     ENDOCRINE A:   No acute issues  P:    Autoimmune A:   Lupus  P:   Continue home prednisone  NEUROLOGIC A:   Chronic pain  P:   PRN ultram per home regimen   FAMILY  - Updates: patient, mother updated in ED  - Inter-disciplinary family meet or Palliative Care meeting due by:  1/12   Georgann Housekeeper, AGACNP-BC [place] Pulmonology/Critical Care Pager [phone] or [phone]  2016-04-20 2:55 AM

## 2016-05-08 NOTE — Transfer of Care (Signed)
Immediate Anesthesia Transfer of Care Note  Patient: Sara [name]  Procedure(s) Performed: Procedure(s): Ligation OF ARTERIOVENOUS GORETEX GRAFT (Right) INSERTION OF DIALYSIS CATHETER Right Internal Jugular. (Right)  Patient Location: PACU  Anesthesia Type:General  Level of Consciousness: awake and alert   Airway & Oxygen Therapy: Patient Spontanous Breathing and Patient connected to nasal cannula oxygen  Post-op Assessment: Report given to RN and Post -op Vital signs reviewed and stable  Post vital signs: Reviewed and stable  Last Vitals:  Vitals:   04/23/16 0345 04-23-2016 0400  BP: (!) 88/64 (!) 86/68  Pulse:    Resp: 22 21  Temp:      Last Pain:  Vitals:   Apr 23, 2016 0306  TempSrc:   PainSc: Asleep         Complications: No apparent anesthesia complications

## 2016-05-08 NOTE — Anesthesia Preprocedure Evaluation (Signed)
Anesthesia Evaluation  Patient identified by MRN, date of birth, ID band Patient awake    Reviewed: Allergy & Precautions, NPO status , Patient's Chart, lab work & pertinent test results  Airway Mallampati: II  TM Distance: >3 FB Neck ROM: Full    Dental   Pulmonary Current Smoker,    Pulmonary exam normal        Cardiovascular hypertension, Pt. on medications Normal cardiovascular exam     Neuro/Psych Bipolar Disorder Schizophrenia    GI/Hepatic   Endo/Other    Renal/GU ESRF and DialysisRenal disease     Musculoskeletal   Abdominal   Peds  Hematology   Anesthesia Other Findings   Reproductive/Obstetrics                             Anesthesia Physical Anesthesia Plan  ASA: III  Anesthesia Plan: General   Post-op Pain Management:    Induction: Intravenous, Rapid sequence and Cricoid pressure planned  Airway Management Planned: Oral ETT  Additional Equipment:   Intra-op Plan:   Post-operative Plan: Extubation in OR  Informed Consent: I have reviewed the patients History and Physical, chart, labs and discussed the procedure including the risks, benefits and alternatives for the proposed anesthesia with the patient or authorized representative who has indicated his/her understanding and acceptance.     Plan Discussed with: Surgeon and CRNA  Anesthesia Plan Comments:         Anesthesia Quick Evaluation

## 2016-05-08 NOTE — Progress Notes (Signed)
1245 this RN entered pt's room to hang albumin, pt OBS RR 30-45, this RN attempted to talk to pt and asked pt if she was hurting and to slow bxing, pt without response how pt was looking at this RN, albumin scanned, pt RR dropped 12-14/min, pt stopped moaning and was looking up toward ceiling, 2nd RN called to room to aide with assessment pt VSS on monitor, pt RR agonal, pt sternal rubbed without response, pt with HR 46 however no pulse palpable. CPR started  [id number] Code called TOD 1339

## 2016-05-08 NOTE — Anesthesia Procedure Notes (Signed)
Procedure Name: Intubation Date/Time: 04/30/2016 5:04 AM Performed by: Manuela Schwartz B Pre-anesthesia Checklist: Patient identified, Emergency Drugs available, Suction available, Patient being monitored and Timeout performed Patient Re-evaluated:Patient Re-evaluated prior to inductionOxygen Delivery Method: Circle system utilized Preoxygenation: Pre-oxygenation with 100% oxygen Intubation Type: IV induction, Cricoid Pressure applied and Rapid sequence Laryngoscope Size: Mac and 3 Grade View: Grade I Tube type: Oral Tube size: 7.0 mm Number of attempts: 1 Airway Equipment and Method: Stylet Placement Confirmation: ETT inserted through vocal cords under direct vision,  positive ETCO2 and breath sounds checked- equal and bilateral Secured at: 21 cm Tube secured with: Tape Dental Injury: Teeth and Oropharynx as per pre-operative assessment

## 2016-05-08 NOTE — ED Notes (Signed)
Report attempted, RN with another pt, report to be call back in 10 min.

## 2016-05-08 NOTE — ED Notes (Signed)
VS here to see pt

## 2016-05-08 NOTE — ED Notes (Signed)
Fistula bleeding, MD at bedside.

## 2016-05-08 NOTE — Telephone Encounter (Signed)
-----   Message from Mena Goes, RN sent at 04/25/2016  9:13 AM EST ----- Regarding: schedule 2 weeks   ----- Message ----- From: Gabriel Earing, PA-C Sent: 2016/04/25   6:35 AM To: [facility] Charge Pool  Will need to f/u with Dr. Donzetta Matters in 2 weeks s/p ligation of bleeding fistula and will need new HD access.  Thanks [employer]

## 2016-05-08 NOTE — ED Notes (Signed)
Pt taken to OR.

## 2016-05-08 NOTE — Discharge Summary (Signed)
PULMONARY / CRITICAL CARE MEDICINE   Name: EDITHE ANSCHUTZ MRN: [id number] DOB: 10/02/1980    ADMISSION DATE:  04/17/2016 CONSULTATION DATE:  1/5  REFERRING MD:  Dr. Christy Gentles  CHIEF COMPLAINT:  Bleeding from AVF  HISTORY OF PRESENT ILLNESS:   36 year old female with PMH as below, which is significant for ESRD on HD ([name]) secondary to Lupus, CHF, bipolar disorder, and schizoaffective disorder. She gets HD at [facility] and presents through ED which accounts for the number of frequent admissions she has. Last treatment was 1/3. After dialysis she had some issues with her fistula bleeding, but this was relatively light and not overall concerning. 1/4 late PM she had a significant amount of hemorrhage from AVF and presented to the ED for this reason. Initially in ED the bleeding had subsided, but then it erupted while in waiting room and she was rushed back to trauma bay. EDP placed 5 sutures and she was evaluated by vascular surgery who recommended fisulogram during this hospitalization. She was hypotensive and given one unit PRBC, but still remained hypotensive. She is unable to get additional transfusions at this time due to anitbody. PCCM asked to admit.  PAST MEDICAL HISTORY :  She  has a past medical history of Acute myopericarditis; Bipolar disorder ([place]); CHF (congestive heart failure) ([place]); Chronic anemia; Current use of steroid medication (12/21/2015); ESRD (end stage renal disease) on dialysis 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); and Tobacco abuse (02/20/2014).  PAST SURGICAL HISTORY: She  has a past surgical history that includes AV fistula placement (Right, 03/2013); AV fistula repair (Right, 2015); head  surgery (2005); and AV fistula placement (Right, 03/10/2013).  Allergies  Allergen Reactions  . Ativan [Lorazepam] Swelling and Other (See Comments)    Reaction:  Difficulty speaking/slurred speech   . Geodon [Ziprasidone Hcl] Itching and Swelling    Reaction:  Tongue swelling   . Keflex [Cephalexin] Swelling and Other (See Comments)    Reaction:  Tongue swelling/pt is unable to talk   . Haldol [Haloperidol Lactate] Swelling and Other (See Comments)    Reaction:  Tongue swelling -- 05/31/15  Pt is able to take with Benadryl.    . Other Itching and Other (See Comments)    Pt states that she is allergic to wool.     No current facility-administered medications on file prior to encounter.    Current Outpatient Prescriptions on File Prior to Encounter  Medication Sig  . acetaminophen (TYLENOL) 500 MG tablet Take 1,000 mg by mouth every 6 (six) hours as needed for mild pain, moderate pain or headache.   Marland Kitchen aspirin-acetaminophen-caffeine (EXCEDRIN MIGRAINE) 250-250-65 MG tablet Take 2 tablets by mouth every 6 (six) hours as needed for headache.  . metoprolol (LOPRESSOR) 50 MG tablet Take 1 tablet (50 mg total) by mouth 2 (two) times daily.  . predniSONE (DELTASONE) 20 MG tablet Take 1 tablet (20 mg total) by mouth daily with breakfast.  . risperiDONE microspheres (RISPERDAL CONSTA) 25 MG injection Inject 25 mg into the muscle every 14 (fourteen) days.  . sevelamer carbonate (RENVELA) 800 MG tablet Take 4 tablets (3,200 mg total) by mouth 3 (three) times daily with meals.  . traMADol (ULTRAM) 50 MG tablet Take 1 tablet (50 mg total) by mouth every 6 (six) hours as needed for moderate pain.  FAMILY HISTORY:  Her indicated that her mother is alive. She indicated that her father is alive. She indicated that her sister is alive. She indicated that her brother is alive.    SOCIAL HISTORY: She  reports that she has been smoking Cigarettes.  She has a 2.00 pack-year smoking history. She has never  used smokeless tobacco. She reports that she does not drink alcohol or use drugs.  REVIEW OF SYSTEMS:   Bolds are positive  Constitutional: weight loss, gain, night sweats, Fevers, chills, fatigue .  HEENT: headaches, Sore throat, sneezing, nasal congestion, post nasal drip, Difficulty swallowing, Tooth/dental problems, visual complaints visual changes, ear ache CV:  chest pain, radiates: ,Orthopnea, PND, swelling in lower extremities, dizziness, palpitations, syncope.  GI  heartburn, indigestion, abdominal pain, nausea, vomiting, diarrhea, change in bowel habits, loss of appetite, bloody stools.  Resp: cough, productive: , hemoptysis, dyspnea, chest pain, pleuritic.  Skin: rash or itching or icterus GU: dysuria, change in color of urine, urgency or frequency. flank pain, hematuria  MS: back pain or swelling. decreased range of motion  Psych: change in mood or affect. depression or anxiety.  Neuro: difficulty with speech, weakness, numbness, ataxia    SUBJECTIVE:    VITAL SIGNS: BP (!) 88/65 (BP Location: Left Arm)   Pulse (!) 109   Temp 97 F (36.1 C) (Axillary)   Resp 12   Wt 81.4 kg (179 lb 7.3 oz)   SpO2 100%   BMI 28.11 kg/m   HEMODYNAMICS:    VENTILATOR SETTINGS:    INTAKE / OUTPUT: I/O last 3 completed shifts: In: 1994 [I.V.:1000; Blood:994] Out: 50 [Blood:50]  PHYSICAL EXAMINATION: General:  Young female in NAD Neuro:  Alert, oriented, non-focal HEENT: [place]/AT, PERRL, no JVD Cardiovascular:  RRR, 2/6 SEM Lungs:  Clear Abdomen:  Distended, fluctuant. Non-tender Musculoskeletal:  No acute deformity Skin:  RUE AVF hemostatic.   LABS:  BMET  Recent Labs Lab 04/09/16 1010 04/09/2016 2350 May 01, 2016 0500 05/01/16 0915  NA 134* 138 136 136  K 6.6* 4.5 4.7 5.3*  CL 95* 99*  --  101  CO2 20* 21*  --  22  BUN 97* 64*  --  62*  CREATININE 15.03* 11.54*  --  11.34*  GLUCOSE 105* 66 90 113*    Electrolytes  Recent Labs Lab 04/09/16 1010 04/30/2016 2350  05-01-16 0915  CALCIUM 9.9 8.9 8.4*  PHOS 11.1*  --   --     CBC  Recent Labs Lab 04/30/2016 0834 05/01/2016 2241 05/01/2016 0500 2016/05/01 0915  WBC 12.0* 18.0*  --  29.7*  HGB 8.9* 7.6* 7.5* 8.0*  HCT 27.8* 23.4* 22.0* 24.2*  PLT 149* 98*  --  89*    Coag's  Recent Labs Lab 05/06/2016 2241  APTT 31  INR 1.61    Sepsis Markers  Recent Labs Lab 2016-05-01 1007  LATICACIDVEN 1.9    ABG No results for input(s): PHART, PCO2ART, PO2ART in the last 168 hours.  Liver Enzymes  Recent Labs Lab 04/09/16 1010  ALBUMIN 3.5    Cardiac Enzymes No results for input(s): TROPONINI, PROBNP in the last 168 hours.  Glucose No results for input(s): GLUCAP in the last 168 hours.  Imaging Dg Chest Port 1 View  Result Date: 05-01-16 CLINICAL DATA:  Status post dialysis catheter placement EXAM: PORTABLE CHEST 1 VIEW COMPARISON:  02/23/2016 FINDINGS: Cardiac shadow remains enlarged. A new right jugular central line is seen with the tip at the cavoatrial junction in satisfactory position. Mild central  vascular congestion is noted with mild interstitial edema. No focal confluent infiltrate is seen. No pneumothorax is noted. Chronic changes in the rib cage and bilateral clavicles are again noted. No new focal abnormality is seen. IMPRESSION: No pneumothorax following central line placement. Changes consistent with mild CHF. Electronically Signed   By: Inez Catalina M.D.   On: May 09, 2016 07:48   Dg Fluoro Guide Cv Line-no Report  Result Date: 05/09/16 There is no Radiologist interpretation  for this exam.    STUDIES:    CULTURES:   ANTIBIOTICS:   SIGNIFICANT EVENTS:   LINES/TUBES:   DISCUSSION:   ASSESSMENT / PLAN:  PULMONARY A: No acute issues  P:     CARDIOVASCULAR A:  Hypotension secondary to hemorrhage H/o HTN  P:  Telemetry monitoring Holding home metoprolol Transfuse an additional unit PRBC once matched bag is found.  Gentle IVF resuscitation Trend  CBC Hold off pressors for now Vascular following for AVF care  RENAL A:   ESRD on HD  P:   Consult nephrology in AM  GASTROINTESTINAL A:   No acute issues  P:   Renal diet  HEMATOLOGIC A:   ABLA on anemia of chronic illness  P:  Follow CBC Transfuse one additional unit as above  INFECTIOUS A:   No acute issues  P:     ENDOCRINE A:   No acute issues  P:    Autoimmune A:   Lupus  P:   Continue home prednisone  NEUROLOGIC A:   Chronic pain  P:   PRN ultram per home regimen   FAMILY  - Updates: patient, mother updated in ED  - Inter-disciplinary family meet or Palliative Care meeting due by:  1/12   Georgann Housekeeper, AGACNP-BC [place] [facility] Pager [phone] or [phone]  May 09, 2016 1:53 PM   Pt had a complicated course. Pls see my last 2 progress notes:    PULMONARY / CRITICAL CARE MEDICINE   Name: TAARA JENCKS MRN: [id number] DOB: 03-10-1981    ADMISSION DATE:  04/30/2016 CONSULTATION DATE:  1/5  REFERRING MD:  Dr. Christy Gentles  CHIEF COMPLAINT:  Bleeding from AVF  HISTORY OF PRESENT ILLNESS:   36 year old female with PMH as below, which is significant for ESRD on HD ([name]) secondary to Lupus, CHF, bipolar disorder, and schizoaffective disorder. She gets HD at [facility] and presents through ED which accounts for the number of frequent admissions she has. Last treatment was 1/3. After dialysis she had some issues with her fistula bleeding, but this was relatively light and not overall concerning. 1/4 late PM she had a significant amount of hemorrhage from AVF and presented to the ED for this reason. Initially in ED the bleeding had subsided, but then it erupted while in waiting room and she was rushed back to trauma bay. EDP placed 5 sutures and she was evaluated by vascular surgery who recommended fisulogram during this hospitalization. She was hypotensive and given one unit PRBC, but still remained  hypotensive. She is unable to get additional transfusions at this time due to anitbody. PCCM asked to admit.   SUBJECTIVE:  Remains hypotensive but better with pressors. Got 2u pRBC overnight. Had ligation of RUE AVF and placement of a R ant chest tunnelled catheter.  Sleepy (post op) but protecting airway. (-) other subj complaints.    VITAL SIGNS: BP (!) 88/65 (BP Location: Left Arm)   Pulse (!) 109   Temp 97 F (36.1 C) (Axillary)  Resp 12   Wt 81.4 kg (179 lb 7.3 oz)   SpO2 100%   BMI 28.11 kg/m   HEMODYNAMICS:    VENTILATOR SETTINGS:    INTAKE / OUTPUT: I/O last 3 completed shifts: In: 1994 [I.V.:1000; Blood:994] Out: 50 [Blood:50]  PHYSICAL EXAMINATION: General:  Young female in NAD Neuro:  Alert, oriented, non-focal. CN grossly intact. (-) lateralizing signs.  HEENT: [place]/AT, PERRL, no JVD Cardiovascular:  RRR, 2/6 SEM Lungs:  Clear. Good ae. (+) R ant chest tunnelled HD catheter. Abdomen:  Distended, fluctuant. Non-tender Musculoskeletal:  No acute deformity Skin:  RUE AVF with dressing. Gr 2 edema  LABS:  BMET  Recent Labs Lab 04/09/16 1010 04/09/2016 2350 Apr 12, 2016 0500 04-12-16 0915  NA 134* 138 136 136  K 6.6* 4.5 4.7 5.3*  CL 95* 99*  --  101  CO2 20* 21*  --  22  BUN 97* 64*  --  62*  CREATININE 15.03* 11.54*  --  11.34*  GLUCOSE 105* 66 90 113*    Electrolytes  Recent Labs Lab 04/09/16 1010 05/04/2016 2350 04-12-16 0915  CALCIUM 9.9 8.9 8.4*  PHOS 11.1*  --   --     CBC  Recent Labs Lab 04/28/2016 0834 05/05/2016 2241 04-12-16 0500 April 12, 2016 0915  WBC 12.0* 18.0*  --  29.7*  HGB 8.9* 7.6* 7.5* 8.0*  HCT 27.8* 23.4* 22.0* 24.2*  PLT 149* 98*  --  89*    Coag's  Recent Labs Lab 04/16/2016 2241  APTT 31  INR 1.61    Sepsis Markers  Recent Labs Lab Apr 12, 2016 1007  LATICACIDVEN 1.9    ABG No results for input(s): PHART, PCO2ART, PO2ART in the last 168 hours.  Liver Enzymes  Recent Labs Lab 04/09/16 1010  ALBUMIN  3.5    Cardiac Enzymes No results for input(s): TROPONINI, PROBNP in the last 168 hours.  Glucose No results for input(s): GLUCAP in the last 168 hours.  Imaging Dg Chest Port 1 View  Result Date: 2016-04-12 CLINICAL DATA:  Status post dialysis catheter placement EXAM: PORTABLE CHEST 1 VIEW COMPARISON:  02/23/2016 FINDINGS: Cardiac shadow remains enlarged. A new right jugular central line is seen with the tip at the cavoatrial junction in satisfactory position. Mild central vascular congestion is noted with mild interstitial edema. No focal confluent infiltrate is seen. No pneumothorax is noted. Chronic changes in the rib cage and bilateral clavicles are again noted. No new focal abnormality is seen. IMPRESSION: No pneumothorax following central line placement. Changes consistent with mild CHF. Electronically Signed   By: Inez Catalina M.D.   On: 12-Apr-2016 07:48   Dg Fluoro Guide Cv Line-no Report  Result Date: Apr 12, 2016 There is no Radiologist interpretation  for this exam.    STUDIES:    CULTURES: MRSA 1/4 (-) Blood 1/5 >   ANTIBIOTICS:   SIGNIFICANT EVENTS: 1/4 admitted for hypotension and bleeding AVF. Went to OR. To ICU post op.   LINES/TUBES: R ant chest tunnelled HD cath 1/5 >   DISCUSSION: 13F, with ESRD, with psych issues (was discharged from several HD centers), admitted for hypotension/anemia/bleeding AVF.  Went to OR for ligation of RUE AVF and placement of tunnelled HD catheter. ICU post op.    ASSESSMENT / PLAN:  PULMONARY A: No acute issues  P:   Keep o2 sats > 88%  CARDIOVASCULAR A:  Hypotension secondary to hemorrhage H/o HTN  P:  S/P 2 u pRBC prior to AVF ligation (1/5).   Await rpt Hb. Transfuse  for Hb < 7.  On neosynephrine > small dose > anticipate we can wean off today. She is in ICU as she is on neo Telemetry monitoring Holding home metoprolol Vascular following for AVF care  RENAL A:   ESRD on HD  P:   Nephrology consulted.  Dr. Moshe Cipro aware.  Last HD was 1/3.   GASTROINTESTINAL A:   No acute issues  P:   Renal diet once more awake.   HEMATOLOGIC A:   ABLA on anemia of chronic illness  P:  Follow CBC. S/P 2 u pRBC, checking Hb and Hct now. Transfuse for Hb < 7  INFECTIOUS A:   No acute issues Hypotension is likely 2/2 anemia with bleeding.   P:   Recent admission and pt is a frequent flyer.  Has elevated WBC. MRSA nares is (-). CXR with no obvious PNA.  Will check blood culture and lactic acid.  Holding off on abx unless with clinical deterioration.   ENDOCRINE A:   No acute issues  P:    Autoimmune A:   Lupus  P:   Continue home prednisone  NEUROLOGIC A:   Chronic pain Schizoaffective disorder/Bipolar D/O  P:   PRN ultram per home regimen May need haldol for agitation. Also, may try precedex as well.    FAMILY  - Updates: patient, mother updated in ICU on 1/5.   - Inter-disciplinary family meet or Palliative Care meeting due by:  1/12 Critical care time with this patient today ; 30 minutes.    Monica Becton, MD 04/25/16, 1:53 PM [place] [facility] Pager [phone] After 3 pm or if no answer, call [phone]   Addendum :   [facility] PCCM  I was called to be at bedside as pt was in Code Blue.  Per RN, it appeared she had a focal seizure then had bradycardia then went asystole.  Pt was intubated by RT. CPR and code blue started. Pt then had 2 episodes of Vfib for she she had defibrillation done.  For the most part of the code, she remained PEA.  Neosynpehrine drip was increased as well as levophed drip started during code.  I stat revealed Hb at 6. pRBC was ordered earlier  but not available as she has autoantibodies. HCO3 was 17 and K was 5.1.  She received a total of 9 pushes of epi 1 mg each, 2 amps of naHCo3, 1 amp of Ca glucontae.  I was in touch with pt's father throughout the code blue.  We were waiting for pt's mother to arrive.   When she arrived (44 minutes into the code), she remained PEA. Pt was pronounced at 1:39 pm.  Family at bedside.   Pls provide post mortem care.   Monica Becton, MD 04-25-16, 1:53 PM [place] [facility] Pager [phone] After 3 pm or if no answer, call [phone]

## 2016-05-08 NOTE — Progress Notes (Signed)
  Day of Surgery Note    Subjective:  CPR in progress  Vitals:   03-May-2016 0815 2016/05/03 1152  BP:    Pulse: (!) 109   Resp:    Temp: 97.6 F (36.4 C) 97 F (36.1 C)    Incisions:   Right arm bandage is clean and dry.   Right upper arm soft.   Assessment/Plan:  This is a 36 y.o. female who is s/p  1.  Exploration and ligation of right arm brachiocephalic fistula 2.  US guided cannulation of right internal jugular vein 3.  Placement 19cm tunneled dialysis catheter with fluoroscopy  -does not appear to be bleeding from incision right arm as bandage is clean and dry and arm is soft.   Leontine Locket, PA-C 2016/05/03 1:19 PM [phone]

## 2016-05-08 NOTE — Progress Notes (Signed)
   Called back for further bleeding from avf with apparent significant hemorrhage. She is stable but at this time merits exploration in the operating room with repair. I have discussed that this may cause further blood loss and possibly lead to demise of fistula and require tunneled catheter placement. She understands and we will proceed tonight.   Jay Kempe C. Donzetta Matters, MD [facility] [place] Office: [phone] Pager: [phone]

## 2016-05-08 NOTE — ED Provider Notes (Signed)
Pt still hypotensive despite blood products I have consulted critical care to see patient for admission BP (!) 71/51   Pulse 120   Temp 97.6 F (36.4 C) (Oral)   Resp (!) 29   SpO2 100%     Ripley Fraise, MD 05-03-2016 0206

## 2016-05-08 NOTE — Anesthesia Postprocedure Evaluation (Signed)
Anesthesia Post Note  Patient: Sara [name]  Procedure(s) Performed: Procedure(s) (LRB): Ligation OF ARTERIOVENOUS GORETEX GRAFT (Right) INSERTION OF DIALYSIS CATHETER Right Internal Jugular. (Right)  Patient location during evaluation: PACU Anesthesia Type: General Level of consciousness: awake and alert Pain management: pain level controlled Vital Signs Assessment: post-procedure vital signs reviewed and stable Respiratory status: spontaneous breathing, nonlabored ventilation, respiratory function stable and patient connected to nasal cannula oxygen Cardiovascular status: blood pressure returned to baseline and stable Postop Assessment: no signs of nausea or vomiting Anesthetic complications: no       Last Vitals:  Vitals:   11-May-2016 1100 11-May-2016 1152  BP: (!) 99/44   Pulse: 62   Resp: 19   Temp:  36.1 C    Last Pain:  Vitals:   11-May-2016 1152  TempSrc: Axillary  PainSc:                  Renato Spellman DAVID

## 2016-05-08 NOTE — Op Note (Signed)
Patient name: BRIONE BOTHUN MRN: [id number] DOB: February 04, 1981 Sex: female  May 07, 2016 Pre-operative Diagnosis: esrd, bleeding right arm av fistula Post-operative diagnosis:  Same Surgeon:  Eda Paschal. Donzetta Matters, MD Assistant: Leontine Locket, PA Procedure Performed: 1.  Exploration and ligation of right arm brachiocephalic fistula 2.  US guided cannulation of right internal jugular vein 3.  Placement 19cm tunneled dialysis catheter with fluoroscopy   Indications:  36 year old female history of end-stage renal disease on dialysis via the emergency department presented to the emergency department tonight with bleeding from her AV fistula. This was initially temporized with suturing but again re-bled and had to have a pressure bandage placed. She was therefore indicated for the above procedure.  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 There are multiple calcified pseudoaneurysms surrounding this. I elected to ligate the fistula proximally and distally. At completion she had a palpable radial pulse. Tunneled dialysis catheter was then placed through a patent internal jugular vein.   Procedure:  The patient was identified in the holding area and taken to the operating room where she was placed supine on the operating table general endotracheal anesthesia was induced and given antibiotics sterilely prepped and draped in her right upper extremity for a right arm access procedure and a timeout was called. We began by inflating a tourniquet cephalad to the fistula to 250 mmHg. This tourniquet remained inflated for 17 minutes. We then explored the fistula at its proximal aspect by making a marginal incision overlying it. We able to gain access around the proximal aspect of fistula and place a clamp there. We then explored the distal aspect of the fistula by extending our incision over the area of bleeding. We encircled the distally placed  a clamp tear and the lateral tourniquet down. This time we began mobilizing our fistula and by connecting our 2 incisions. We did have to excise some skin. It was noted that the fistula was not going to be viable given the amount of calcification pseudoaneurysms and the 50% circumferential blowout.  I allowed backbleeding and forward bleeding and there was significant clot there. I then oversewed the proximal distal aspects with 4-0 Prolene suture and was hemostatic. Wound was then irrigated and hemostasis obtained. We placed a few 3-0 Vicryl sutures and then closed the skin with 3-0 nylon interrupted. The patient's arm was wrapped with Ace wrap she does have palpable radial pulse at completion.  Drapes were taken down we then prepped and draped the right neck and chest area. Using ultrasound height identify the internal jugular vein noted to be patent and compressible. I then cannulated with 18-gauge needle and passed a wire on the fluoroscopy into the inferior vena cava. A 19 cm dialysis catheter was then tunneled through a separate incision. The wire tract was then serially dilated and under fluoroscopy the introducer was placed into the right atrium. Wire and dilator were removed and the catheter was fed the right atrium. Completion fluoroscopy demonstrated termination the right atrium with smooth curve without kinks. I then connected the pieces of the tunneled dialysis catheter and flushed easily. Then instilled 1.5 mL of heparin in each port. Neck incision was closed with 4-0 Monocryl and the catheter secured with 3-0 nylon suture. Patient was then allowed awaken from anesthesia having tolerated both parts the procedure well.  Blood loss 50 mL.  One unit packed red blood cells administered.   Adelbert Gaspard C. Donzetta Matters, MD  [facility] [place] Office: [phone] Pager: [phone]

## 2016-05-08 NOTE — Progress Notes (Signed)
LEFT Upper Extremity Vein Map    Cephalic  Segment Diameter Depth Comment  1. Prox upper arm 0.63mm mm   2. Mid upper arm 2.84mm mm thrombus  3. Above AC 1.73mm mm   4. In AC 1.58mm mm   5. Below AC 2.90mm mm thrombus  6. Mid forearm mm mm   7. Wrist mm mm    mm mm    mm mm    mm mm    Basilic  Segment Diameter Depth Comment  1. Prox upper arm 2.56mm 9.26mm   2. Mid upper arm 2.10mm 4.53mm thrombus  3. Above [facility] 1.20mm 3.48mm thrombus  4. In [facility] 1.20mm 6.73mm thrombus  5. Below AC 1.29mm 3.28mm thrombus  6. Mid forearm mm mm   7. Wrist mm mm    mm mm    mm mm    mm mm     Landry Mellow, RDMS, RVT 05-06-2016

## 2016-05-08 NOTE — Progress Notes (Signed)
Patient intubated by Sharyne Peach, RRT, RCP during a Code Blue.  Patient intubate with a 7.5 ETT secured at 25 at the lip.  Glide scope lo pro 4 used for visualization.

## 2016-05-08 NOTE — Progress Notes (Signed)
PULMONARY / CRITICAL CARE MEDICINE   Name: Sara [name] MRN: [id number] DOB: [birth year]    ADMISSION DATE:  05/06/2016 CONSULTATION DATE:  1/5  REFERRING MD:  Dr. Christy Gentles  CHIEF COMPLAINT:  Bleeding from AVF  HISTORY OF PRESENT ILLNESS:   36 year old female with PMH as below, which is significant for ESRD on HD ([name]) secondary to Lupus, CHF, bipolar disorder, and schizoaffective disorder. She gets HD at [facility] and presents through ED which accounts for the number of frequent admissions she has. Last treatment was 1/3. After dialysis she had some issues with her fistula bleeding, but this was relatively light and not overall concerning. 1/4 late PM she had a significant amount of hemorrhage from AVF and presented to the ED for this reason. Initially in ED the bleeding had subsided, but then it erupted while in waiting room and she was rushed back to trauma bay. EDP placed 5 sutures and she was evaluated by vascular surgery who recommended fisulogram during this hospitalization. She was hypotensive and given one unit PRBC, but still remained hypotensive. She is unable to get additional transfusions at this time due to anitbody. PCCM asked to admit.  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  PAST SURGICAL HISTORY: She  has a past surgical history that includes AV fistula placement (Right, 03/2013); AV fistula repair (Right, 2015); head  surgery (2005); and AV fistula placement (Right, 03/10/2013).  Allergies  Allergen Reactions  . Ativan [Lorazepam] Swelling and Other (See Comments)    Reaction:  Difficulty speaking/slurred speech   . Geodon [Ziprasidone Hcl] Itching and Swelling    Reaction:  Tongue swelling   . Keflex [Cephalexin] Swelling and Other (See Comments)    Reaction:  Tongue swelling/pt is unable to talk   . Haldol [Haloperidol Lactate] Swelling and Other (See Comments)    Reaction:  Tongue swelling -- 05/31/15  Pt is able to take with Benadryl.    . Other Itching and Other (See Comments)    Pt states that she is allergic to wool.     No current facility-administered medications on file prior to encounter.    Current Outpatient Prescriptions on File Prior to Encounter  Medication Sig  . acetaminophen (TYLENOL) 500 MG tablet Take 1,000 mg by mouth every 6 (six) hours as needed for mild pain, moderate pain or headache.   Marland Kitchen aspirin-acetaminophen-caffeine (EXCEDRIN MIGRAINE) 250-250-65 MG tablet Take 2 tablets by mouth every 6 (six) hours as needed for headache.  . metoprolol (LOPRESSOR) 50 MG tablet Take 1 tablet (50 mg total) by mouth 2 (two) times daily.  . predniSONE (DELTASONE) 20 MG tablet Take 1 tablet (20 mg total) by mouth daily with breakfast.  . risperiDONE microspheres (RISPERDAL CONSTA) 25 MG injection Inject 25 mg into the muscle every 14 (fourteen) days.  . sevelamer carbonate (RENVELA) 800 MG tablet Take 4 tablets (3,200 mg total) by mouth 3 (three) times daily with meals.  . traMADol (ULTRAM) 50 MG tablet Take 1 tablet (50 mg total) by mouth every 6 (six) hours as needed for moderate pain.  FAMILY HISTORY:  Her indicated that her mother is alive. She indicated that her father is alive. She indicated that her sister is alive. She indicated that her brother is alive.    SOCIAL HISTORY: She  reports that she has been smoking Cigarettes.  She has a 2.00 pack-year smoking history. She has never  used smokeless tobacco. She reports that she does not drink alcohol or use drugs.  REVIEW OF SYSTEMS:   Bolds are positive  Constitutional: weight loss, gain, night sweats, Fevers, chills, fatigue .  HEENT: headaches, Sore throat, sneezing, nasal congestion, post nasal drip, Difficulty swallowing, Tooth/dental problems, visual complaints visual changes, ear ache CV:  chest pain, radiates: ,Orthopnea, PND, swelling in lower extremities, dizziness, palpitations, syncope.  GI  heartburn, indigestion, abdominal pain, nausea, vomiting, diarrhea, change in bowel habits, loss of appetite, bloody stools.  Resp: cough, productive: , hemoptysis, dyspnea, chest pain, pleuritic.  Skin: rash or itching or icterus GU: dysuria, change in color of urine, urgency or frequency. flank pain, hematuria  MS: back pain or swelling. decreased range of motion  Psych: change in mood or affect. depression or anxiety.  Neuro: difficulty with speech, weakness, numbness, ataxia    SUBJECTIVE:    VITAL SIGNS: BP (!) 88/65 (BP Location: Left Arm)   Pulse (!) 109   Temp 97 F (36.1 C) (Axillary)   Resp 12   Wt 81.4 kg (179 lb 7.3 oz)   SpO2 100%   BMI 28.11 kg/m   HEMODYNAMICS:    VENTILATOR SETTINGS:    INTAKE / OUTPUT: I/O last 3 completed shifts: In: 1994 [I.V.:1000; Blood:994] Out: 50 [Blood:50]  PHYSICAL EXAMINATION: General:  Young female in NAD Neuro:  Alert, oriented, non-focal HEENT: [place]/AT, PERRL, no JVD Cardiovascular:  RRR, 2/6 SEM Lungs:  Clear Abdomen:  Distended, fluctuant. Non-tender Musculoskeletal:  No acute deformity Skin:  RUE AVF hemostatic.   LABS:  BMET  Recent Labs Lab 04/09/16 1010 05/02/2016 2350 05/01/16 0500 05/01/16 0915  NA 134* 138 136 136  K 6.6* 4.5 4.7 5.3*  CL 95* 99*  --  101  CO2 20* 21*  --  22  BUN 97* 64*  --  62*  CREATININE 15.03* 11.54*  --  11.34*  GLUCOSE 105* 66 90 113*    Electrolytes  Recent Labs Lab 04/09/16 1010 04/23/2016 2350  05-01-2016 0915  CALCIUM 9.9 8.9 8.4*  PHOS 11.1*  --   --     CBC  Recent Labs Lab 05/02/2016 0834 04/12/2016 2241 01-May-2016 0500 May 01, 2016 0915  WBC 12.0* 18.0*  --  29.7*  HGB 8.9* 7.6* 7.5* 8.0*  HCT 27.8* 23.4* 22.0* 24.2*  PLT 149* 98*  --  89*    Coag's  Recent Labs Lab 04/17/2016 2241  APTT 31  INR 1.61    Sepsis Markers  Recent Labs Lab 01-May-2016 1007  LATICACIDVEN 1.9    ABG No results for input(s): PHART, PCO2ART, PO2ART in the last 168 hours.  Liver Enzymes  Recent Labs Lab 04/09/16 1010  ALBUMIN 3.5    Cardiac Enzymes No results for input(s): TROPONINI, PROBNP in the last 168 hours.  Glucose No results for input(s): GLUCAP in the last 168 hours.  Imaging Dg Chest Port 1 View  Result Date: 2016-05-01 CLINICAL DATA:  Status post dialysis catheter placement EXAM: PORTABLE CHEST 1 VIEW COMPARISON:  02/23/2016 FINDINGS: Cardiac shadow remains enlarged. A new right jugular central line is seen with the tip at the cavoatrial junction in satisfactory position. Mild central  vascular congestion is noted with mild interstitial edema. No focal confluent infiltrate is seen. No pneumothorax is noted. Chronic changes in the rib cage and bilateral clavicles are again noted. No new focal abnormality is seen. IMPRESSION: No pneumothorax following central line placement. Changes consistent with mild CHF. Electronically Signed   By: Inez Catalina M.D.   On: 04-27-16 07:48   Dg Fluoro Guide Cv Line-no Report  Result Date: 2016-04-27 There is no Radiologist interpretation  for this exam.    STUDIES:    CULTURES:   ANTIBIOTICS:   SIGNIFICANT EVENTS:   LINES/TUBES:   DISCUSSION:   ASSESSMENT / PLAN:  PULMONARY A: No acute issues  P:     CARDIOVASCULAR A:  Hypotension secondary to hemorrhage H/o HTN  P:  Telemetry monitoring Holding home metoprolol Transfuse an additional unit PRBC once matched bag is found.  Gentle IVF resuscitation Trend  CBC Hold off pressors for now Vascular following for AVF care  RENAL A:   ESRD on HD  P:   Consult nephrology in AM  GASTROINTESTINAL A:   No acute issues  P:   Renal diet  HEMATOLOGIC A:   ABLA on anemia of chronic illness  P:  Follow CBC Transfuse one additional unit as above  INFECTIOUS A:   No acute issues  P:     ENDOCRINE A:   No acute issues  P:    Autoimmune A:   Lupus  P:   Continue home prednisone  NEUROLOGIC A:   Chronic pain  P:   PRN ultram per home regimen   FAMILY  - Updates: patient, mother updated in ED  - Inter-disciplinary family meet or Palliative Care meeting due by:  1/12   Georgann Housekeeper, AGACNP-BC [facility] Pager [phone] or [phone]  04/27/2016 1:50 PM   Pt had a complicated course. Pls see my last 2 progress notes:    PULMONARY / CRITICAL CARE MEDICINE   Name: Sara [name] MRN: [id number] DOB: 1981-02-03    ADMISSION DATE:  04/28/2016 CONSULTATION DATE:  1/5  REFERRING MD:  Dr. Christy Gentles  CHIEF COMPLAINT:  Bleeding from AVF  HISTORY OF PRESENT ILLNESS:   36 year old female with PMH as below, which is significant for ESRD on HD ([name]) secondary to Lupus, CHF, bipolar disorder, and schizoaffective disorder. She gets HD at [facility] and presents through ED which accounts for the number of frequent admissions she has. Last treatment was 1/3. After dialysis she had some issues with her fistula bleeding, but this was relatively light and not overall concerning. 1/4 late PM she had a significant amount of hemorrhage from AVF and presented to the ED for this reason. Initially in ED the bleeding had subsided, but then it erupted while in waiting room and she was rushed back to trauma bay. EDP placed 5 sutures and she was evaluated by vascular surgery who recommended fisulogram during this hospitalization. She was hypotensive and given one unit PRBC, but still remained  hypotensive. She is unable to get additional transfusions at this time due to anitbody. PCCM asked to admit.   SUBJECTIVE:  Remains hypotensive but better with pressors. Got 2u pRBC overnight. Had ligation of RUE AVF and placement of a R ant chest tunnelled catheter.  Sleepy (post op) but protecting airway. (-) other subj complaints.    VITAL SIGNS: BP (!) 88/65 (BP Location: Left Arm)   Pulse (!) 109   Temp 97 F (36.1 C) (Axillary)  Resp 12   Wt 81.4 kg (179 lb 7.3 oz)   SpO2 100%   BMI 28.11 kg/m   HEMODYNAMICS:    VENTILATOR SETTINGS:    INTAKE / OUTPUT: I/O last 3 completed shifts: In: 1994 [I.V.:1000; Blood:994] Out: 50 [Blood:50]  PHYSICAL EXAMINATION: General:  Young female in NAD Neuro:  Alert, oriented, non-focal. CN grossly intact. (-) lateralizing signs.  HEENT: [place]/AT, PERRL, no JVD Cardiovascular:  RRR, 2/6 SEM Lungs:  Clear. Good ae. (+) R ant chest tunnelled HD catheter. Abdomen:  Distended, fluctuant. Non-tender Musculoskeletal:  No acute deformity Skin:  RUE AVF with dressing. Gr 2 edema  LABS:  BMET  Recent Labs Lab 04/09/16 1010 04/12/2016 2350 May 06, 2016 0500 May 06, 2016 0915  NA 134* 138 136 136  K 6.6* 4.5 4.7 5.3*  CL 95* 99*  --  101  CO2 20* 21*  --  22  BUN 97* 64*  --  62*  CREATININE 15.03* 11.54*  --  11.34*  GLUCOSE 105* 66 90 113*    Electrolytes  Recent Labs Lab 04/09/16 1010 04/09/2016 2350 2016-05-06 0915  CALCIUM 9.9 8.9 8.4*  PHOS 11.1*  --   --     CBC  Recent Labs Lab 04/30/2016 0834 04/23/2016 2241 May 06, 2016 0500 05-06-16 0915  WBC 12.0* 18.0*  --  29.7*  HGB 8.9* 7.6* 7.5* 8.0*  HCT 27.8* 23.4* 22.0* 24.2*  PLT 149* 98*  --  89*    Coag's  Recent Labs Lab 04/27/2016 2241  APTT 31  INR 1.61    Sepsis Markers  Recent Labs Lab 05-06-2016 1007  LATICACIDVEN 1.9    ABG No results for input(s): PHART, PCO2ART, PO2ART in the last 168 hours.  Liver Enzymes  Recent Labs Lab 04/09/16 1010  ALBUMIN  3.5    Cardiac Enzymes No results for input(s): TROPONINI, PROBNP in the last 168 hours.  Glucose No results for input(s): GLUCAP in the last 168 hours.  Imaging Dg Chest Port 1 View  Result Date: 2016/05/06 CLINICAL DATA:  Status post dialysis catheter placement EXAM: PORTABLE CHEST 1 VIEW COMPARISON:  02/23/2016 FINDINGS: Cardiac shadow remains enlarged. A new right jugular central line is seen with the tip at the cavoatrial junction in satisfactory position. Mild central vascular congestion is noted with mild interstitial edema. No focal confluent infiltrate is seen. No pneumothorax is noted. Chronic changes in the rib cage and bilateral clavicles are again noted. No new focal abnormality is seen. IMPRESSION: No pneumothorax following central line placement. Changes consistent with mild CHF. Electronically Signed   By: Inez Catalina M.D.   On: May 06, 2016 07:48   Dg Fluoro Guide Cv Line-no Report  Result Date: May 06, 2016 There is no Radiologist interpretation  for this exam.    STUDIES:    CULTURES: MRSA 1/4 (-) Blood 1/5 >   ANTIBIOTICS:   SIGNIFICANT EVENTS: 1/4 admitted for hypotension and bleeding AVF. Went to OR. To ICU post op.   LINES/TUBES: R ant chest tunnelled HD cath 1/5 >   DISCUSSION: 38F, with ESRD, with psych issues (was discharged from several HD centers), admitted for hypotension/anemia/bleeding AVF.  Went to OR for ligation of RUE AVF and placement of tunnelled HD catheter. ICU post op.    ASSESSMENT / PLAN:  PULMONARY A: No acute issues  P:   Keep o2 sats > 88%  CARDIOVASCULAR A:  Hypotension secondary to hemorrhage H/o HTN  P:  S/P 2 u pRBC prior to AVF ligation (1/5).   Await rpt Hb. Transfuse  for Hb < 7.  On neosynephrine > small dose > anticipate we can wean off today. She is in ICU as she is on neo Telemetry monitoring Holding home metoprolol Vascular following for AVF care  RENAL A:   ESRD on HD  P:   Nephrology consulted.  Dr. Moshe Cipro aware.  Last HD was 1/3.   GASTROINTESTINAL A:   No acute issues  P:   Renal diet once more awake.   HEMATOLOGIC A:   ABLA on anemia of chronic illness  P:  Follow CBC. S/P 2 u pRBC, checking Hb and Hct now. Transfuse for Hb < 7  INFECTIOUS A:   No acute issues Hypotension is likely 2/2 anemia with bleeding.   P:   Recent admission and pt is a frequent flyer.  Has elevated WBC. MRSA nares is (-). CXR with no obvious PNA.  Will check blood culture and lactic acid.  Holding off on abx unless with clinical deterioration.   ENDOCRINE A:   No acute issues  P:    Autoimmune A:   Lupus  P:   Continue home prednisone  NEUROLOGIC A:   Chronic pain Schizoaffective disorder/Bipolar D/O  P:   PRN ultram per home regimen May need haldol for agitation. Also, may try precedex as well.    FAMILY  - Updates: patient, mother updated in ICU on 1/5.   - Inter-disciplinary family meet or Palliative Care meeting due by:  1/12 Critical care time with this patient today ; 30 minutes.    Monica Becton, MD April 21, 2016, 1:51 PM [facility] Pager [phone] After 3 pm or if no answer, call [phone]   Addendum :   [facility] PCCM  I was called to be at bedside as pt was in Code Blue.  Per RN, it appeared she had a focal seizure then had bradycardia then went asystole.  Pt was intubated by RT. CPR and code blue started. Pt then had 2 episodes of Vfib for she she had defibrillation done.  For the most part of the code, she remained PEA.  Neosynpehrine drip was increased as well as levophed drip started during code.  I stat revealed Hb at 6. pRBC was ordered earlier  but not available as she has autoantibodies. HCO3 was 17 and K was 5.1.  She received a total of 9 pushes of epi 1 mg each, 2 amps of naHCo3, 1 amp of Ca glucontae.  I was in touch with pt's father throughout the code blue.  We were waiting for pt's mother to arrive.   When she arrived (44 minutes into the code), she remained PEA. Pt was pronounced at 1:39 pm.  Family at bedside.   Pls provide post mortem care.   Monica Becton, MD 04-21-2016, 1:51 PM [facility] Pager [phone] After 3 pm or if no answer, call [phone]

## 2016-05-08 NOTE — Progress Notes (Addendum)
   [facility] PCCM  I was called to be at bedside as pt was in Code Blue.  Per RN, it appeared she had a focal seizure then had bradycardia then went asystole.  Pt was intubated by RT. CPR and code blue started. Pt then had 2 episodes of Vfib for she she had defibrillation done.  For the most part of the code, she remained PEA.  Neosynpehrine drip was increased as well as levophed drip started during code.  I stat revealed Hb at 6. pRBC was ordered earlier  but not available as she has autoantibodies. HCO3 was 17 and K was 5.1.  She received a total of 9 pushes of epi 1 mg each, 2 amps of naHCo3, 1 amp of Ca glucontae.  I was in touch with pt's father throughout the code blue.  We were waiting for pt's mother to arrive.  When she arrived (44 minutes into the code), she remained PEA. Pt was pronounced at 1:39 pm.  Family at bedside.   Pls provide post mortem care.   Monica Becton, MD 04-May-2016, 1:49 PM [place] [facility] Pager [phone] After 3 pm or if no answer, call [phone]
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
## 2016-05-08 NOTE — ED Notes (Signed)
Admitting MD at the bedside.  

## 2016-05-08 NOTE — Progress Notes (Signed)
Responded to code. Only pt's dad there at time, pt's mom had gone home to sleep after having been here all night. With dad when doctor gave medical update after 25 mins. CPR and no pulse, and doctor began to talk on phone re: same to mom on way back to hospital but connection lost. Waited to escort mom at front entrance, son joined [place] just before 2S. Pt shortly passed after 50+ mins. CPR. Mom and son very emotionally distraught. Provided spiritual/emotional/grief support. Family spending time alone in rm now with pt. Chaplain standing by.   2016-05-07 1300  Clinical Encounter Type  Visited With Patient and family together;Health care provider  Visit Type Initial;Social support;Death  Referral From Nurse  Spiritual Encounters  Spiritual Needs Grief support  Stress Factors  Patient Stress Factors Other (Comment) (Passed)  Family Stress Factors Loss   Gerrit Heck, Chaplain

## 2016-05-08 NOTE — ED Provider Notes (Signed)
Dialysis fistula began to bleed again Pressure held, and I placed 3 prolene sutures This did not completely control bleed I then placed pressure bandage at 0345 with hemorrhage control Pt awake/alert, stable at this time D/w dr Donzetta Matters with vascular he will see patient   Bleeding dialysis fistula Performed by: Sharyon Cable Consent: Verbal consent obtained. Emergent procedure due to active bleeding No Foreign Bodies seen or palpated Skin closure: simple Number of sutures or staples: 3 prolene Technique: interrupted Patient tolerance: Patient tolerated the procedure well    Ripley Fraise, MD 2016-05-11 [phone]

## 2016-05-08 NOTE — Progress Notes (Signed)
At pt's mom's request, prayed w/ mom, dad, family, and nurse who joined [place] for comfort and blessings on the family in their time of grief. Provided emotional/spiritual support. Gave pt's dad the patient placement card w/ information about calling when family was ready to decide to what funeral home/crematorium to release pt's remains. Also gave valet parking ticket for mom's car to dad. Chaplain available for f/u.   04/22/16 1400  Clinical Encounter Type  Visited With Family  Visit Type Death  Referral From Nurse  Spiritual Encounters  Spiritual Needs Prayer;Emotional;Grief support  Stress Factors  Family Stress Factors Loss   Gerrit Heck, Chaplain

## 2016-05-08 NOTE — Progress Notes (Signed)
  Patient Name: Sara [name]   MRN: [id number]   Date of Birth/ Sex: Oct 08, 1980 , female      Admission Date: 04/07/2016  Attending Provider: Rigoberto Noel, MD  Primary Diagnosis: Hemorrhage 123XX123 Complication of vascular access for dialysis, initial encounter [T82.9XXA]   Indication: Pt was in her usual state of health until this PM, when she was noted to be bradycardic and having agonal breathing. Code blue was subsequently called. At the time of arrival on scene, ACLS protocol was underway.   Technical Description:  - CPR performance duration:  45 minutes  - Was defibrillation or cardioversion used? Yes  - Was external pacer placed? No  - Was patient intubated pre/post CPR? Yes   Medications Administered: Y = Yes; Blank = No Amiodarone    Atropine    Calcium  Y  Epinephrine  Y  Lidocaine    Magnesium    Norepinephrine  Y  Phenylephrine  Y  Sodium bicarbonate  Y  Vasopressin    Other    Post CPR evaluation:  - Final Status - Was patient successfully resuscitated ? No   Miscellaneous Information:  - Time of death:  1:45 PM  - Primary team notified?  Yes  - Family Notified? Yes     Odysseus Cada, DO   05/04/2016, 1:43 PM

## 2016-05-08 NOTE — Care Management Note (Signed)
Case Management Note  Patient Details  Name: Sara [name] MRN: [id number] Date of Birth: 12/19/1980  Subjective/Objective: Pt admitted with massive bleed from HD fistula                   Action/Plan:   PTA from home with mom.  Pt is still sleepy from sedation - assessement performed with mother. Pt has excessive admits and is well known to [facility].  Per mom - pt lives in [place] however has to go to [facility] for HD due to no other facility accepting pt - mom denied barriers to pt returning home.  Mom informed CM that pt uses [facility] transportation to HD and PCP sees pt during appts, mom denied barriers to obtaining medications and access to healthcare.  Pt has been discharged from [facility] due to lack of response - mom states she would like for services to be reinstated post discharge - CM contacted program and requested resumption of services.    Pt has been staying with mom for the last 8 months,  mom is extremely supportive and helps pt with keeping up with psych meds.  Pt taken urgently to OR overnight for explor, ligation and insertion of tunneled HD cath.  CM will continue to follow for discharge needs    Expected Discharge Date:                  Expected Discharge Plan:  [place]  In-House Referral:     Discharge planning Services  CM Consult  Post Acute Care Choice:    Choice offered to:     DME Arranged:    DME Agency:     HH Arranged:    HH Agency:     Status of Service:  In process, will continue to follow  If discussed at Long Length of Stay Meetings, dates discussed:    Additional Comments:  Maryclare Labrador, RN Apr 30, 2016, 10:07 AM

## 2016-05-08 NOTE — Progress Notes (Signed)
PULMONARY / CRITICAL CARE MEDICINE   Name: Sara [name] MRN: [id number] DOB: 1980-12-18    ADMISSION DATE:  04/28/2016 CONSULTATION DATE:  1/5  REFERRING MD:  Dr. Christy Gentles  CHIEF COMPLAINT:  Bleeding from AVF  HISTORY OF PRESENT ILLNESS:   36 year old female with PMH as below, which is significant for ESRD on HD ([name]) secondary to Lupus, CHF, bipolar disorder, and schizoaffective disorder. She gets HD at [facility] and presents through ED which accounts for the number of frequent admissions she has. Last treatment was 1/3. After dialysis she had some issues with her fistula bleeding, but this was relatively light and not overall concerning. 1/4 late PM she had a significant amount of hemorrhage from AVF and presented to the ED for this reason. Initially in ED the bleeding had subsided, but then it erupted while in waiting room and she was rushed back to trauma bay. EDP placed 5 sutures and she was evaluated by vascular surgery who recommended fisulogram during this hospitalization. She was hypotensive and given one unit PRBC, but still remained hypotensive. She is unable to get additional transfusions at this time due to anitbody. PCCM asked to admit.   SUBJECTIVE:  Remains hypotensive but better with pressors. Got 2u pRBC overnight. Had ligation of RUE AVF and placement of a R ant chest tunnelled catheter.  Sleepy (post op) but protecting airway. (-) other subj complaints.    VITAL SIGNS: BP (!) 88/65 (BP Location: Left Arm)   Pulse (!) 109   Temp 97.6 F (36.4 C) (Oral)   Resp 12   Wt 81.4 kg (179 lb 7.3 oz)   SpO2 100%   BMI 28.11 kg/m   HEMODYNAMICS:    VENTILATOR SETTINGS:    INTAKE / OUTPUT: I/O last 3 completed shifts: In: 1994 [I.V.:1000; Blood:994] Out: 50 [Blood:50]  PHYSICAL EXAMINATION: General:  Young female in NAD Neuro:  Alert, oriented, non-focal. CN grossly intact. (-) lateralizing signs.  HEENT: [place]/AT, PERRL, no JVD Cardiovascular:  RRR,  2/6 SEM Lungs:  Clear. Good ae. (+) R ant chest tunnelled HD catheter. Abdomen:  Distended, fluctuant. Non-tender Musculoskeletal:  No acute deformity Skin:  RUE AVF with dressing. Gr 2 edema  LABS:  BMET  Recent Labs Lab 04/09/16 1010 04/25/2016 2350  NA 134* 138  K 6.6* 4.5  CL 95* 99*  CO2 20* 21*  BUN 97* 64*  CREATININE 15.03* 11.54*  GLUCOSE 105* 66    Electrolytes  Recent Labs Lab 04/09/16 1010 04/30/2016 2350  CALCIUM 9.9 8.9  PHOS 11.1*  --     CBC  Recent Labs Lab 04/09/16 1010 04/19/2016 0834 04/26/2016 2241  WBC 8.4 12.0* 18.0*  HGB 10.0* 8.9* 7.6*  HCT 31.3* 27.8* 23.4*  PLT 199 149* 98*    Coag's  Recent Labs Lab 05/06/2016 2241  APTT 31  INR 1.61    Sepsis Markers No results for input(s): LATICACIDVEN, PROCALCITON, O2SATVEN in the last 168 hours.  ABG No results for input(s): PHART, PCO2ART, PO2ART in the last 168 hours.  Liver Enzymes  Recent Labs Lab 04/09/16 1010  ALBUMIN 3.5    Cardiac Enzymes No results for input(s): TROPONINI, PROBNP in the last 168 hours.  Glucose No results for input(s): GLUCAP in the last 168 hours.  Imaging Dg Chest Port 1 View  Result Date: 15-Apr-2016 CLINICAL DATA:  Status post dialysis catheter placement EXAM: PORTABLE CHEST 1 VIEW COMPARISON:  02/23/2016 FINDINGS: Cardiac shadow remains enlarged. A new right jugular central line  is seen with the tip at the cavoatrial junction in satisfactory position. Mild central vascular congestion is noted with mild interstitial edema. No focal confluent infiltrate is seen. No pneumothorax is noted. Chronic changes in the rib cage and bilateral clavicles are again noted. No new focal abnormality is seen. IMPRESSION: No pneumothorax following central line placement. Changes consistent with mild CHF. Electronically Signed   By: Inez Catalina M.D.   On: 05-03-16 07:48   Dg Fluoro Guide Cv Line-no Report  Result Date: 05-03-2016 There is no Radiologist interpretation   for this exam.    STUDIES:    CULTURES: MRSA 1/4 (-) Blood 1/5 >   ANTIBIOTICS:   SIGNIFICANT EVENTS: 1/4 admitted for hypotension and bleeding AVF. Went to OR. To ICU post op.   LINES/TUBES: R ant chest tunnelled HD cath 1/5 >   DISCUSSION: 39F, with ESRD, with psych issues (was discharged from several HD centers), admitted for hypotension/anemia/bleeding AVF.  Went to OR for ligation of RUE AVF and placement of tunnelled HD catheter. ICU post op.    ASSESSMENT / PLAN:  PULMONARY A: No acute issues  P:   Keep o2 sats > 88%  CARDIOVASCULAR A:  Hypotension secondary to hemorrhage H/o HTN  P:  S/P 2 u pRBC prior to AVF ligation (1/5).   Await rpt Hb. Transfuse for Hb < 7.  On neosynephrine > small dose > anticipate we can wean off today. She is in ICU as she is on neo Telemetry monitoring Holding home metoprolol Vascular following for AVF care  RENAL A:   ESRD on HD  P:   Nephrology consulted. Dr. Moshe Cipro aware.  Last HD was 1/3.   GASTROINTESTINAL A:   No acute issues  P:   Renal diet once more awake.   HEMATOLOGIC A:   ABLA on anemia of chronic illness  P:  Follow CBC. S/P 2 u pRBC, checking Hb and Hct now. Transfuse for Hb < 7  INFECTIOUS A:   No acute issues Hypotension is likely 2/2 anemia with bleeding.   P:   Recent admission and pt is a frequent flyer.  Has elevated WBC. MRSA nares is (-). CXR with no obvious PNA.  Will check blood culture and lactic acid.  Holding off on abx unless with clinical deterioration.   ENDOCRINE A:   No acute issues  P:    Autoimmune A:   Lupus  P:   Continue home prednisone  NEUROLOGIC A:   Chronic pain Schizoaffective disorder/Bipolar D/O  P:   PRN ultram per home regimen May need haldol for agitation. Also, may try precedex as well.    FAMILY  - Updates: patient, mother updated in ICU on 1/5.   - Inter-disciplinary family meet or Palliative Care meeting due by:   1/12 Critical care time with this patient today ; 30 minutes.    Monica Becton, MD 2016-05-03, 10:12 AM [facility] Pager [phone] After 3 pm or if no answer, call [phone]

## 2016-05-08 NOTE — Progress Notes (Signed)
   [facility] PCCM  Pt's Hb is 8 after getting 2 u pRBC overnight.  She was 7.6 prior to transfusion.  (-) obvious bleeding.   Blood bank called.  It is difficult to get blood for this pt as she has autoantibodiles.  [facility] is also having difficulties getting blood. Blood bank advising to order blood now so it will be available if she needs it.   Will order 2 u pRBC > will keep on hold.   Monica Becton, MD 04/28/2016, 12:37 PM [facility] Pager [phone] After 3 pm or if no answer, call [phone]

## 2016-05-08 NOTE — Progress Notes (Signed)
Blood bank called and updated this RN about blood on this pt, pt with difficult blood match and Blood Bank has contacted [facility] to receive blood for this pt, blood will not arrive in time for HD, MD updated and plan to order 2 units ahead and hold, HD called to update communicated with charge RN will plan to do HD without blood and tx whenever blood arrives   MD also updated that pt refuses to follow commands and is moaning, pt appears to be stable and aware of RN presence in room  Nursing will cont to monitor

## 2016-05-08 NOTE — Consult Note (Signed)
   [facility] CM Inpatient Consult   04/14/2016  TREY DUBOIS [birth year] [id number]  Referral received.  Came by to speak with inpatient RNCM and possibly the family.  Patient currently involved in a medical management situation. Not appropriate at this time.  Natividad Brood, RN BSN [facility] Liaison  [phone] business mobile phone Toll free office [phone]

## 2016-05-08 NOTE — ED Notes (Signed)
No blood infusing at time this RN assumed care.

## 2016-05-08 DEATH — deceased

## 2016-07-09 IMAGING — CR DG CHEST 2V
2 series · 2 of 2 positions shown · non-contrast
Comparison: 08/28/2014.

CLINICAL DATA: Patient states she needs dialysis, last dialysis was
wed. States she gets dialysis her at the hospital because she has
had issues at the [REDACTED]. States she was here on [place]. And
waited 5 hours and didn't get dialysis. C/o bilateral chest pain
across her upper chest worse with inspiration.

EXAM:
CHEST  2 VIEW

[w chest pa]
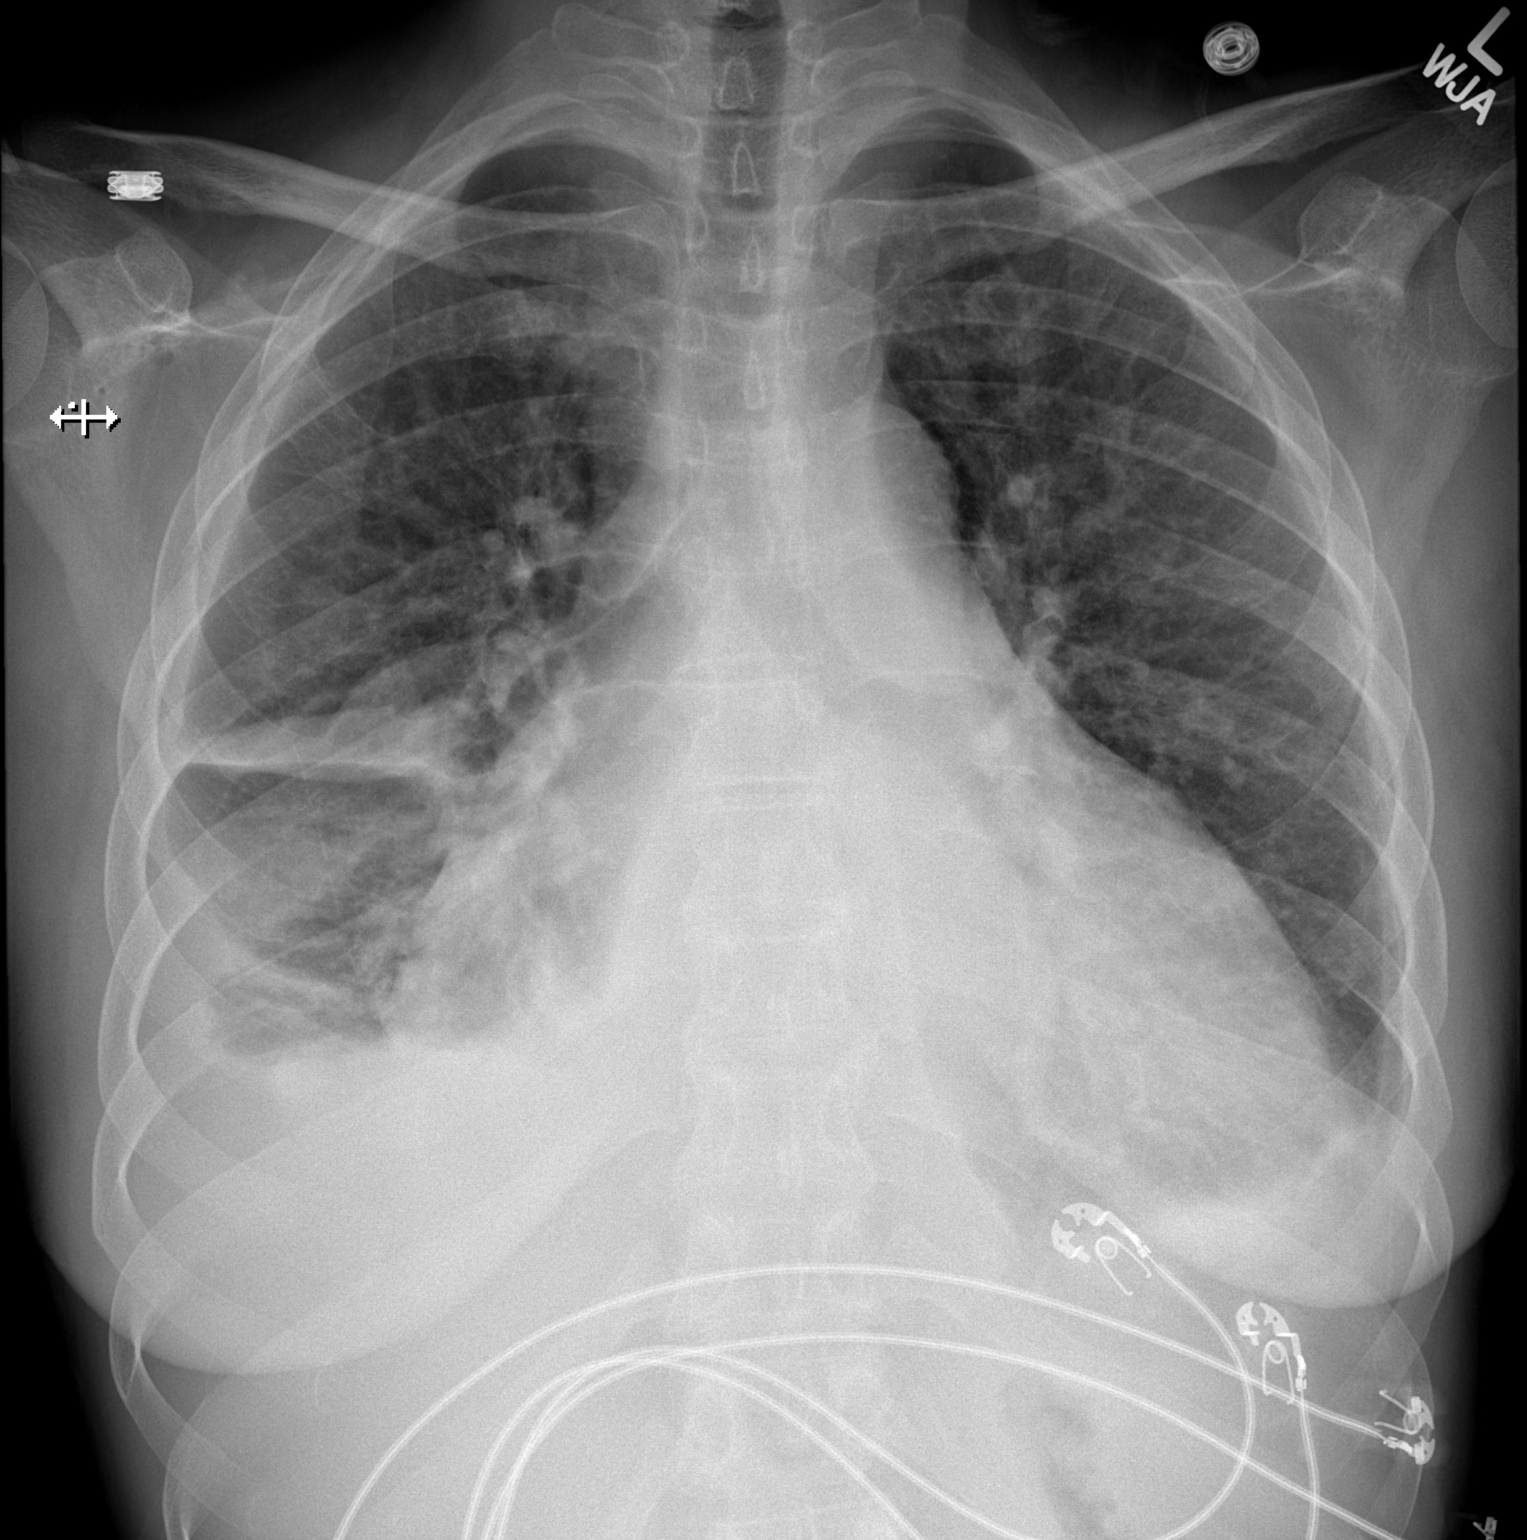

[w chest lat]
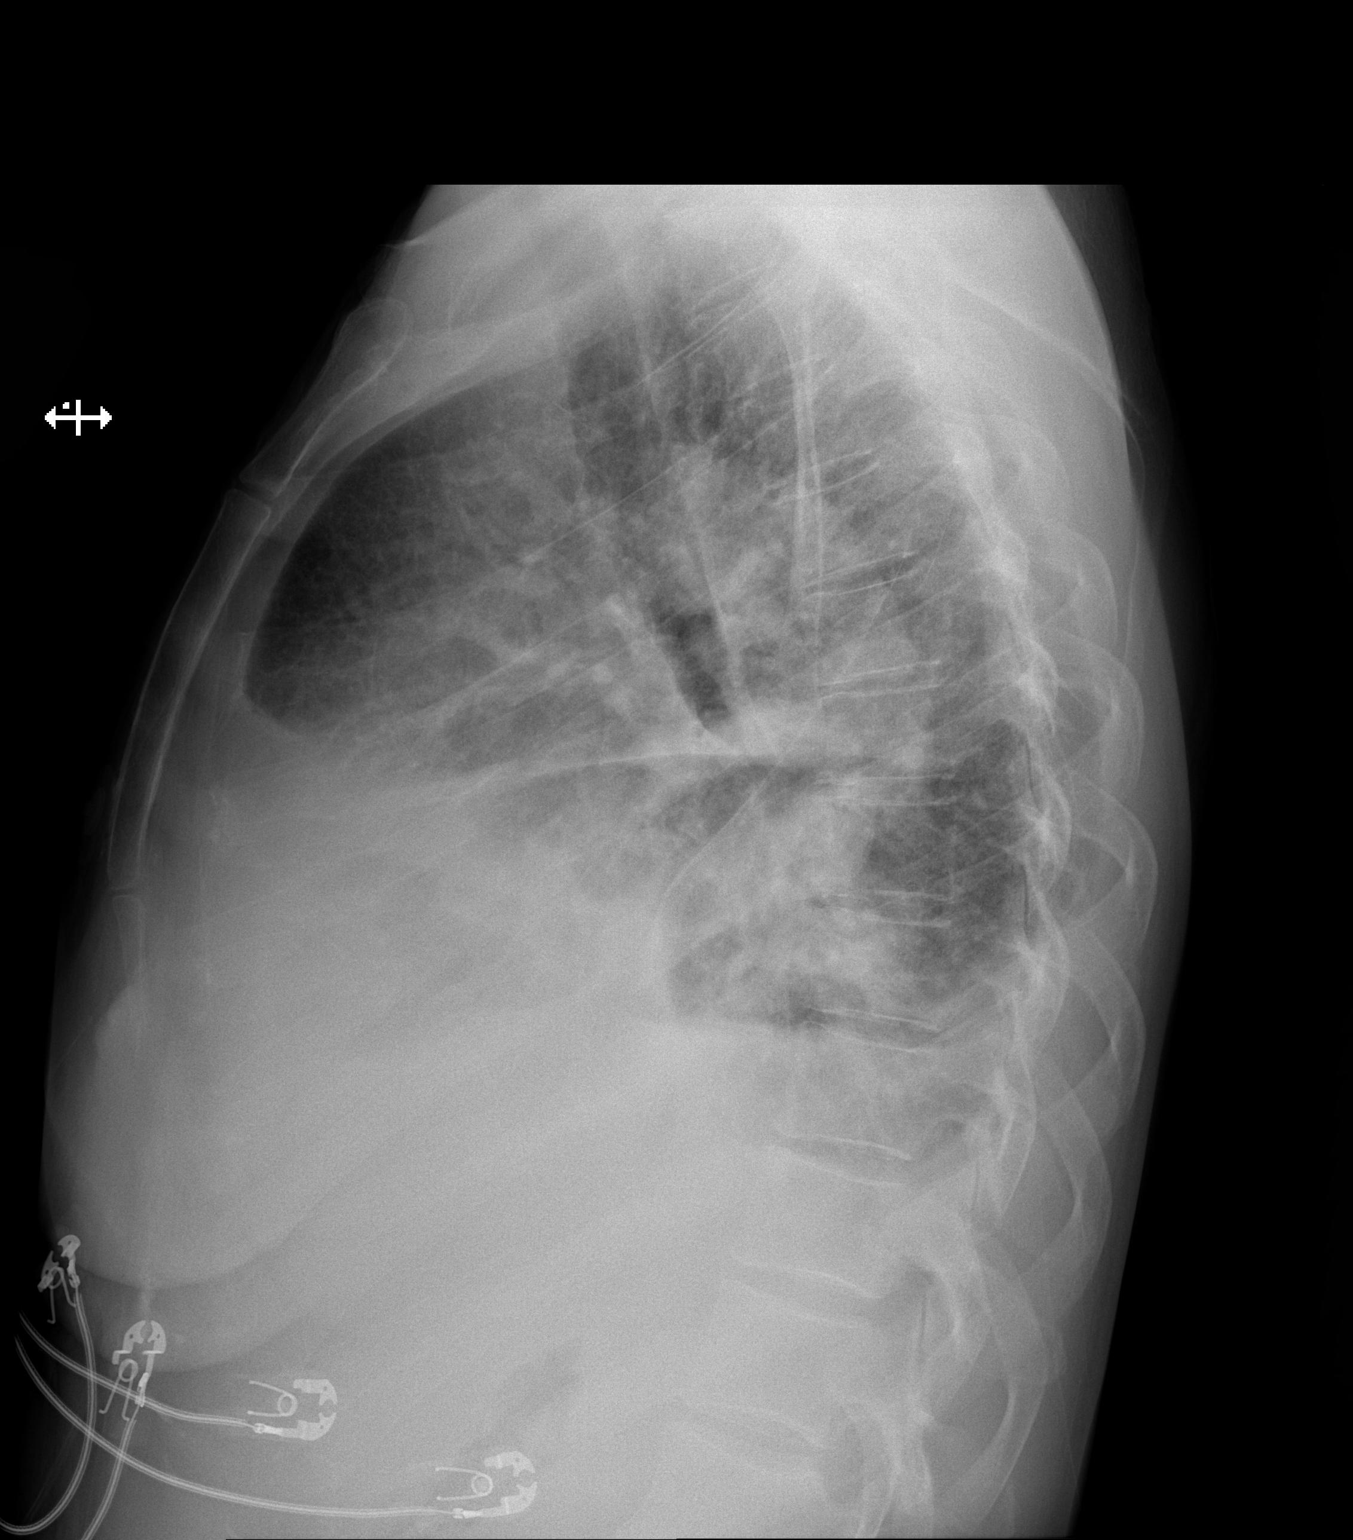

[2 of 2 positions shown; findings below may reference images not displayed]

FINDINGS: There are bilateral pleural effusions, right larger than left.
Confluent opacity is noted in the right lower lobe, which may be
atelectasis. This is supported by some posterior depression of the
right oblique fissure. There is fluid along the minor fissure.

There is vascular congestion with mild interstitial thickening.

Moderate enlargement of the cardiopericardial silhouette.
Mediastinum is normal in contour. No hilar masses or evidence of
adenopathy.

Bony thorax is unremarkable.
IMPRESSION: 1. Mild worsening since the prior exam. There is some increased
opacity in the right lower lobe, which is likely atelectasis. Right
greater than left pleural effusions are similar. Vascular congestion
has mildly increased. Findings consistent with mild congestive heart
failure or fluid overload.

## 2017-09-03 IMAGING — DX DG CHEST 2V
2 series · 2 of 2 positions shown · non-contrast
Comparison: September 28, 2015

CLINICAL DATA: Shortness of breath.  Chronic renal failure

EXAM:
CHEST  2 VIEW

[chest pa]
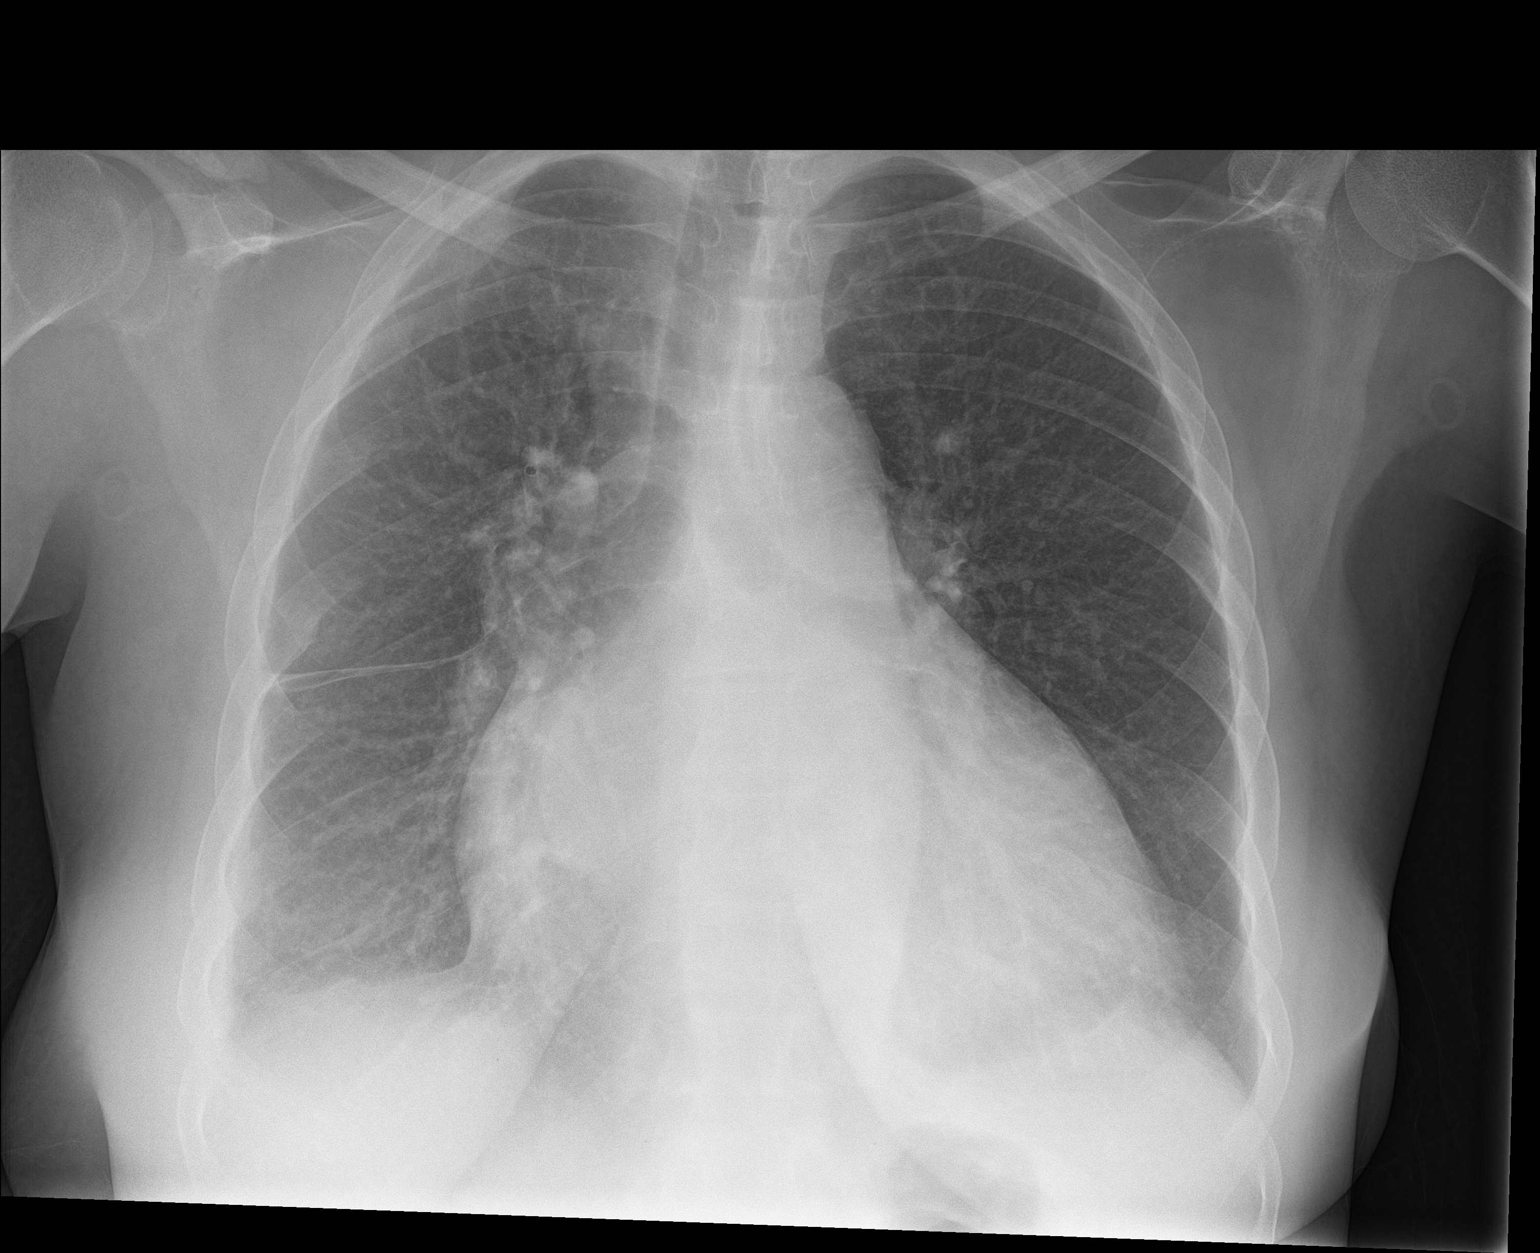

[chest lat]
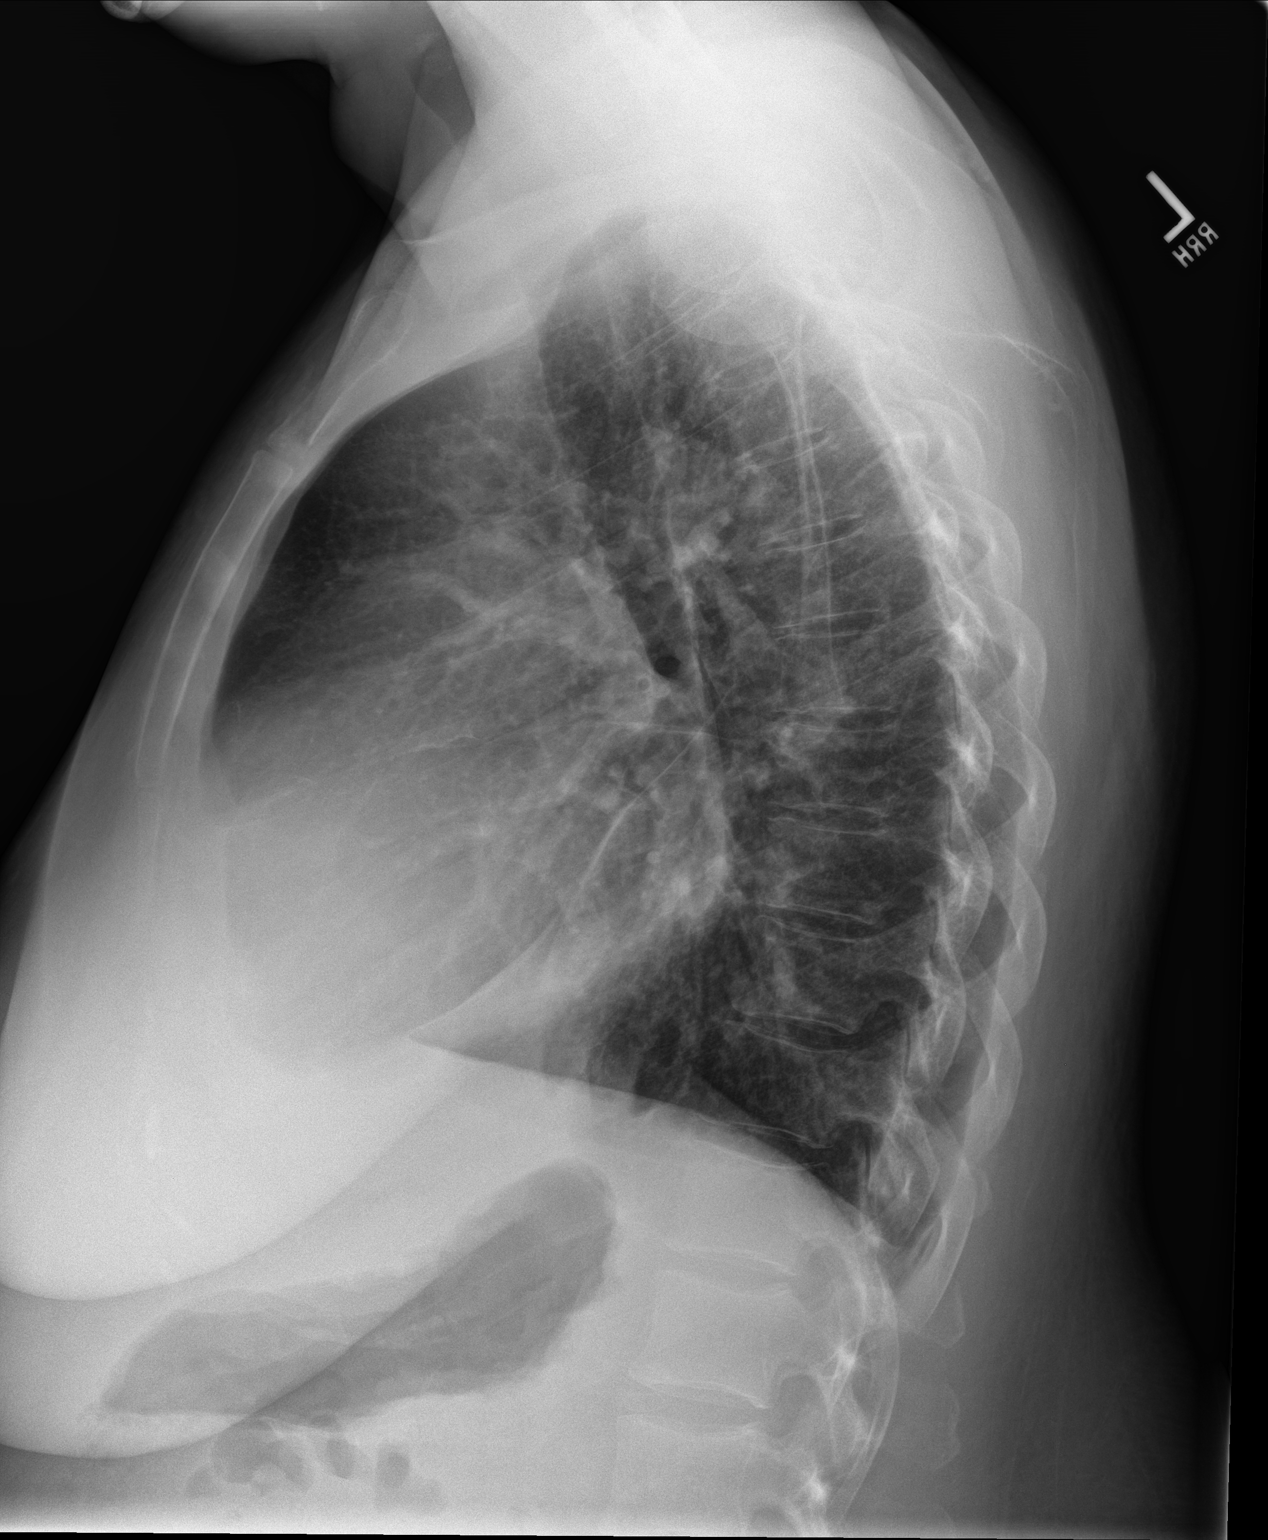

[2 of 2 positions shown; findings below may reference images not displayed]

FINDINGS: There is no edema or consolidation. There is cardiomegaly with
pulmonary vascularity within normal limits. No adenopathy. There is
erosive change in the distal clavicles with extensive bony
hypertrophy in these areas, stable.
IMPRESSION: Persistent cardiomegaly. No edema or consolidation. Changes in the
distal clavicles most likely are due to secondary
hyperparathyroidism associated with chronic renal failure.

## 2017-09-07 IMAGING — DX DG FINGER THUMB 2+V*L*
3 series · 3 of 3 positions shown · non-contrast
Comparison: None.

CLINICAL DATA: Left thumb pain.

EXAM:
LEFT THUMB 2+V

[finger ap]
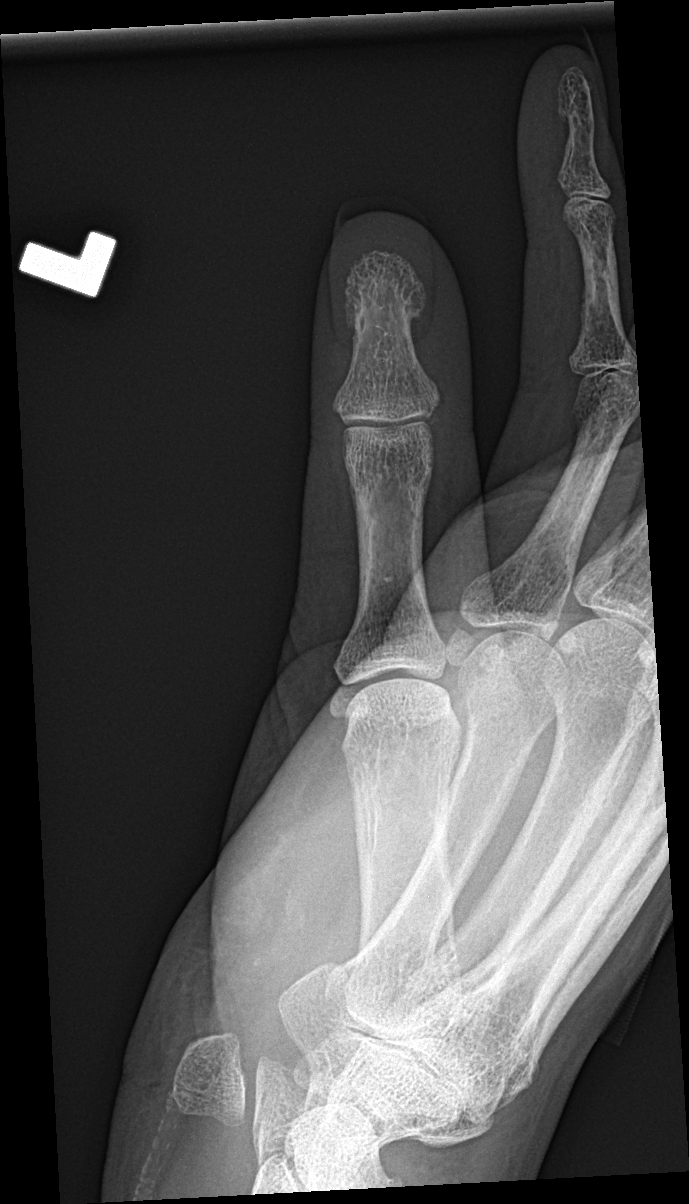

[finger obl]
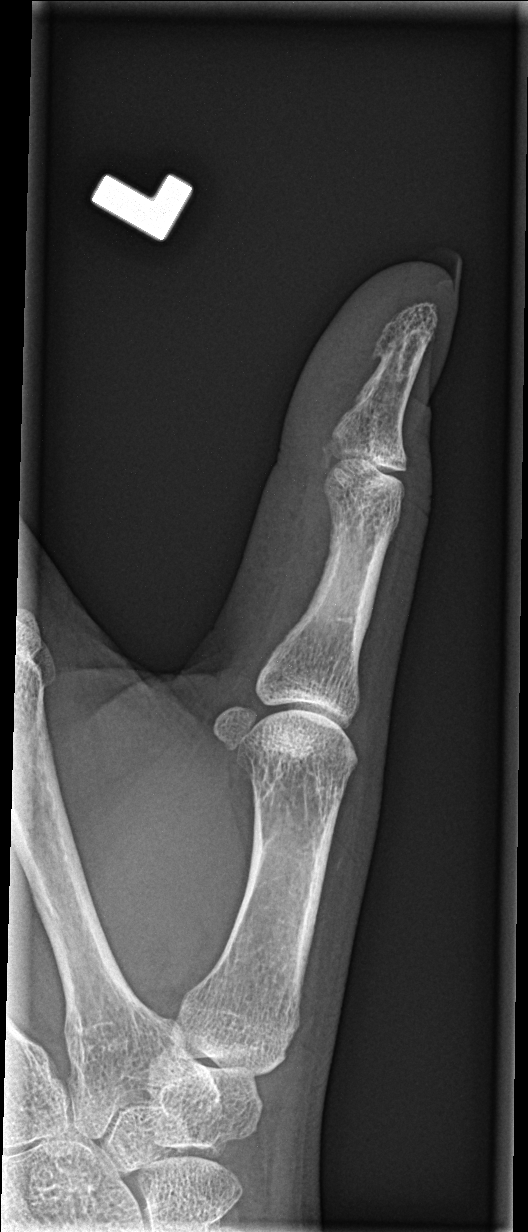

[finger lat]
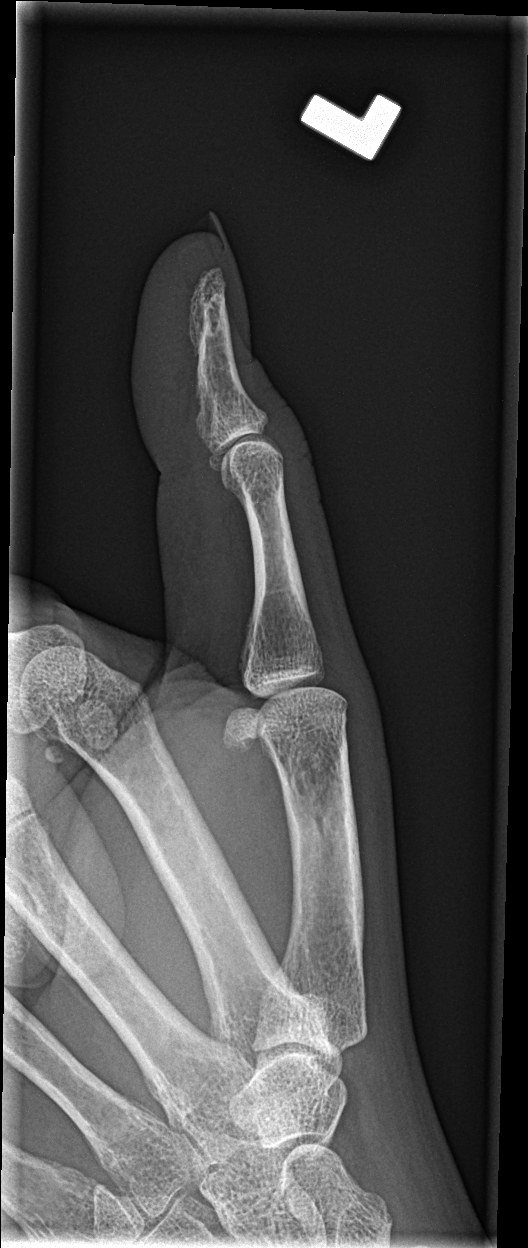

[3 of 3 positions shown; findings below may reference images not displayed]

FINDINGS: There is no evidence of fracture or dislocation. There is no
evidence of arthropathy or other focal bone abnormality. Soft
tissues demonstrate diffuse edema.
IMPRESSION: No acute fracture or dislocation identified about the left thumb.

Diffuse soft tissue edema.

## 2022-12-31 ENCOUNTER — Encounter (HOSPITAL_COMMUNITY): Payer: Self-pay | Admitting: *Deleted
